# Patient Record
Sex: Female | Born: 1935 | Race: White | Hispanic: No | Smoking: Never smoker
Health system: Southern US, Community
[De-identification: ages and names within clinical notes are randomized; demographics above are authoritative.]

## PROBLEM LIST (undated history)

## (undated) DIAGNOSIS — D649 Anemia, unspecified: Secondary | ICD-10-CM

## (undated) DIAGNOSIS — K5730 Diverticulosis of large intestine without perforation or abscess without bleeding: Secondary | ICD-10-CM

## (undated) DIAGNOSIS — H509 Unspecified strabismus: Secondary | ICD-10-CM

## (undated) DIAGNOSIS — G43909 Migraine, unspecified, not intractable, without status migrainosus: Secondary | ICD-10-CM

## (undated) DIAGNOSIS — M48062 Spinal stenosis, lumbar region with neurogenic claudication: Secondary | ICD-10-CM

## (undated) DIAGNOSIS — H409 Unspecified glaucoma: Secondary | ICD-10-CM

## (undated) DIAGNOSIS — C50919 Malignant neoplasm of unspecified site of unspecified female breast: Secondary | ICD-10-CM

## (undated) DIAGNOSIS — Z7901 Long term (current) use of anticoagulants: Secondary | ICD-10-CM

## (undated) DIAGNOSIS — G4730 Sleep apnea, unspecified: Secondary | ICD-10-CM

## (undated) DIAGNOSIS — K208 Other esophagitis: Secondary | ICD-10-CM

## (undated) DIAGNOSIS — H5210 Myopia, unspecified eye: Secondary | ICD-10-CM

## (undated) DIAGNOSIS — M1710 Unilateral primary osteoarthritis, unspecified knee: Secondary | ICD-10-CM

## (undated) DIAGNOSIS — C73 Malignant neoplasm of thyroid gland: Secondary | ICD-10-CM

## (undated) DIAGNOSIS — Z9289 Personal history of other medical treatment: Secondary | ICD-10-CM

## (undated) DIAGNOSIS — H47019 Ischemic optic neuropathy, unspecified eye: Secondary | ICD-10-CM

## (undated) DIAGNOSIS — K589 Irritable bowel syndrome without diarrhea: Secondary | ICD-10-CM

## (undated) DIAGNOSIS — R011 Cardiac murmur, unspecified: Secondary | ICD-10-CM

## (undated) DIAGNOSIS — K602 Anal fissure, unspecified: Secondary | ICD-10-CM

## (undated) DIAGNOSIS — E7800 Pure hypercholesterolemia, unspecified: Secondary | ICD-10-CM

## (undated) DIAGNOSIS — H4450 Unspecified degenerated conditions of globe: Secondary | ICD-10-CM

## (undated) DIAGNOSIS — I4891 Unspecified atrial fibrillation: Principal | ICD-10-CM

## (undated) DIAGNOSIS — F411 Generalized anxiety disorder: Secondary | ICD-10-CM

## (undated) DIAGNOSIS — I10 Essential (primary) hypertension: Secondary | ICD-10-CM

## (undated) DIAGNOSIS — E890 Postprocedural hypothyroidism: Secondary | ICD-10-CM

## (undated) DIAGNOSIS — K449 Diaphragmatic hernia without obstruction or gangrene: Secondary | ICD-10-CM

## (undated) DIAGNOSIS — D509 Iron deficiency anemia, unspecified: Secondary | ICD-10-CM

## (undated) DIAGNOSIS — T7840XA Allergy, unspecified, initial encounter: Secondary | ICD-10-CM

## (undated) DIAGNOSIS — M1990 Unspecified osteoarthritis, unspecified site: Secondary | ICD-10-CM

## (undated) DIAGNOSIS — Z9989 Dependence on other enabling machines and devices: Secondary | ICD-10-CM

## (undated) DIAGNOSIS — H5030 Unspecified intermittent heterotropia: Secondary | ICD-10-CM

## (undated) DIAGNOSIS — R05 Cough: Secondary | ICD-10-CM

## (undated) DIAGNOSIS — Z9889 Other specified postprocedural states: Secondary | ICD-10-CM

## (undated) DIAGNOSIS — K219 Gastro-esophageal reflux disease without esophagitis: Secondary | ICD-10-CM

## (undated) DIAGNOSIS — H35379 Puckering of macula, unspecified eye: Secondary | ICD-10-CM

## (undated) DIAGNOSIS — Z86010 Personal history of colonic polyps: Secondary | ICD-10-CM

## (undated) DIAGNOSIS — K2270 Barrett's esophagus without dysplasia: Secondary | ICD-10-CM

## (undated) DIAGNOSIS — I499 Cardiac arrhythmia, unspecified: Secondary | ICD-10-CM

## (undated) DIAGNOSIS — E119 Type 2 diabetes mellitus without complications: Secondary | ICD-10-CM

## (undated) DIAGNOSIS — M179 Osteoarthritis of knee, unspecified: Secondary | ICD-10-CM

## (undated) DIAGNOSIS — Z8719 Personal history of other diseases of the digestive system: Secondary | ICD-10-CM

## (undated) DIAGNOSIS — C539 Malignant neoplasm of cervix uteri, unspecified: Secondary | ICD-10-CM

## (undated) DIAGNOSIS — E876 Hypokalemia: Secondary | ICD-10-CM

## (undated) DIAGNOSIS — I420 Dilated cardiomyopathy: Secondary | ICD-10-CM

## (undated) DIAGNOSIS — G4733 Obstructive sleep apnea (adult) (pediatric): Secondary | ICD-10-CM

## (undated) DIAGNOSIS — J209 Acute bronchitis, unspecified: Secondary | ICD-10-CM

## (undated) HISTORY — DX: Diverticulosis of large intestine without perforation or abscess without bleeding: K57.30

## (undated) HISTORY — DX: Migraine, unspecified, not intractable, without status migrainosus: G43.909

## (undated) HISTORY — DX: Unspecified strabismus: H50.9

## (undated) HISTORY — DX: Personal history of other medical treatment: Z92.89

## (undated) HISTORY — DX: Essential (primary) hypertension: I10

## (undated) HISTORY — PX: EXCISIONAL HEMORRHOIDECTOMY: SHX1541

## (undated) HISTORY — DX: Postprocedural hypothyroidism: E89.0

## (undated) HISTORY — DX: Generalized anxiety disorder: F41.1

## (undated) HISTORY — DX: Acute bronchitis, unspecified: J20.9

## (undated) HISTORY — DX: Anemia, unspecified: D64.9

## (undated) HISTORY — DX: Other specified postprocedural states: Z98.890

## (undated) HISTORY — DX: Malignant neoplasm of thyroid gland: C73

## (undated) HISTORY — DX: Hypokalemia: E87.6

## (undated) HISTORY — DX: Spinal stenosis, lumbar region with neurogenic claudication: M48.062

## (undated) HISTORY — DX: Unspecified osteoarthritis, unspecified site: M19.90

## (undated) HISTORY — PX: CATARACT EXTRACTION W/ INTRAOCULAR LENS  IMPLANT, BILATERAL: SHX1307

## (undated) HISTORY — DX: Pure hypercholesterolemia, unspecified: E78.00

## (undated) HISTORY — DX: Allergy, unspecified, initial encounter: T78.40XA

## (undated) HISTORY — DX: Cardiac murmur, unspecified: R01.1

## (undated) HISTORY — DX: Unspecified degenerated conditions of globe: H44.50

## (undated) HISTORY — DX: Long term (current) use of anticoagulants: Z79.01

## (undated) HISTORY — DX: Ischemic optic neuropathy, unspecified eye: H47.019

## (undated) HISTORY — DX: Myopia, unspecified eye: H52.10

## (undated) HISTORY — DX: Iron deficiency anemia, unspecified: D50.9

## (undated) HISTORY — DX: Sleep apnea, unspecified: G47.30

## (undated) HISTORY — DX: Unspecified intermittent heterotropia: H50.30

## (undated) HISTORY — DX: Cough: R05

## (undated) HISTORY — DX: Personal history of colonic polyps: Z86.010

## (undated) HISTORY — DX: Irritable bowel syndrome without diarrhea: K58.9

## (undated) HISTORY — DX: Dilated cardiomyopathy: I42.0

## (undated) HISTORY — PX: CARPAL TUNNEL RELEASE: SHX101

## (undated) HISTORY — PX: THYROIDECTOMY: SHX17

## (undated) HISTORY — PX: TONSILLECTOMY: SUR1361

## (undated) HISTORY — DX: Other esophagitis: K20.8

## (undated) HISTORY — PX: COLONOSCOPY: SHX174

## (undated) HISTORY — DX: Barrett's esophagus without dysplasia: K22.70

## (undated) HISTORY — DX: Malignant neoplasm of unspecified site of unspecified female breast: C50.919

## (undated) HISTORY — PX: ABDOMINAL HYSTERECTOMY: SHX81

## (undated) HISTORY — PX: BREAST BIOPSY: SHX20

## (undated) HISTORY — DX: Unspecified atrial fibrillation: I48.91

## (undated) HISTORY — DX: Gastro-esophageal reflux disease without esophagitis: K21.9

## (undated) HISTORY — DX: Puckering of macula, unspecified eye: H35.379

## (undated) HISTORY — PX: BREAST LUMPECTOMY: SHX2

## (undated) HISTORY — DX: Diaphragmatic hernia without obstruction or gangrene: K44.9

---

## 1998-06-08 ENCOUNTER — Ambulatory Visit (HOSPITAL_COMMUNITY): Admission: RE | Admit: 1998-06-08 | Discharge: 1998-06-08 | Payer: Self-pay

## 1999-05-23 ENCOUNTER — Ambulatory Visit (HOSPITAL_COMMUNITY): Admission: RE | Admit: 1999-05-23 | Discharge: 1999-05-23 | Payer: Self-pay | Admitting: Endocrinology

## 1999-05-27 ENCOUNTER — Encounter: Payer: Self-pay | Admitting: Endocrinology

## 1999-08-04 ENCOUNTER — Ambulatory Visit (HOSPITAL_COMMUNITY): Admission: RE | Admit: 1999-08-04 | Discharge: 1999-08-04 | Payer: Self-pay | Admitting: Internal Medicine

## 1999-08-04 ENCOUNTER — Encounter: Payer: Self-pay | Admitting: Internal Medicine

## 2000-10-30 ENCOUNTER — Ambulatory Visit (HOSPITAL_COMMUNITY): Admission: RE | Admit: 2000-10-30 | Discharge: 2000-10-30 | Payer: Self-pay | Admitting: *Deleted

## 2000-10-31 ENCOUNTER — Ambulatory Visit (HOSPITAL_COMMUNITY): Admission: RE | Admit: 2000-10-31 | Discharge: 2000-10-31 | Payer: Self-pay | Admitting: *Deleted

## 2000-12-26 ENCOUNTER — Encounter (INDEPENDENT_AMBULATORY_CARE_PROVIDER_SITE_OTHER): Payer: Self-pay | Admitting: Specialist

## 2000-12-26 ENCOUNTER — Other Ambulatory Visit: Admission: RE | Admit: 2000-12-26 | Discharge: 2000-12-26 | Payer: Self-pay | Admitting: Gastroenterology

## 2001-09-12 ENCOUNTER — Encounter: Admission: RE | Admit: 2001-09-12 | Discharge: 2001-09-18 | Payer: Self-pay | Admitting: Specialist

## 2002-04-02 ENCOUNTER — Ambulatory Visit (HOSPITAL_COMMUNITY): Admission: RE | Admit: 2002-04-02 | Discharge: 2002-04-02 | Payer: Self-pay | Admitting: Cardiology

## 2002-04-02 ENCOUNTER — Encounter: Payer: Self-pay | Admitting: Cardiology

## 2002-07-31 ENCOUNTER — Ambulatory Visit (HOSPITAL_COMMUNITY): Admission: RE | Admit: 2002-07-31 | Discharge: 2002-07-31 | Payer: Self-pay | Admitting: Neurology

## 2002-07-31 ENCOUNTER — Encounter: Payer: Self-pay | Admitting: Neurology

## 2003-05-11 ENCOUNTER — Encounter: Payer: Self-pay | Admitting: Endocrinology

## 2003-05-11 ENCOUNTER — Ambulatory Visit (HOSPITAL_COMMUNITY): Admission: RE | Admit: 2003-05-11 | Discharge: 2003-05-11 | Payer: Self-pay | Admitting: Endocrinology

## 2003-12-05 ENCOUNTER — Emergency Department (HOSPITAL_COMMUNITY): Admission: EM | Admit: 2003-12-05 | Discharge: 2003-12-05 | Payer: Self-pay | Admitting: Emergency Medicine

## 2004-03-14 ENCOUNTER — Encounter: Admission: RE | Admit: 2004-03-14 | Discharge: 2004-06-12 | Payer: Self-pay | Admitting: Endocrinology

## 2004-07-07 ENCOUNTER — Encounter: Admission: RE | Admit: 2004-07-07 | Discharge: 2004-07-07 | Payer: Self-pay | Admitting: Endocrinology

## 2004-08-02 ENCOUNTER — Ambulatory Visit (HOSPITAL_COMMUNITY): Admission: RE | Admit: 2004-08-02 | Discharge: 2004-08-02 | Payer: Self-pay | Admitting: Endocrinology

## 2004-08-05 ENCOUNTER — Encounter: Admission: RE | Admit: 2004-08-05 | Discharge: 2004-08-05 | Payer: Self-pay | Admitting: Endocrinology

## 2004-08-08 ENCOUNTER — Ambulatory Visit (HOSPITAL_COMMUNITY): Admission: RE | Admit: 2004-08-08 | Discharge: 2004-08-08 | Payer: Self-pay | Admitting: Cardiology

## 2005-01-06 ENCOUNTER — Ambulatory Visit: Payer: Self-pay | Admitting: Gastroenterology

## 2005-01-18 ENCOUNTER — Ambulatory Visit: Payer: Self-pay | Admitting: Gastroenterology

## 2005-09-12 ENCOUNTER — Ambulatory Visit: Payer: Self-pay | Admitting: Gastroenterology

## 2005-09-18 ENCOUNTER — Ambulatory Visit: Payer: Self-pay | Admitting: Gastroenterology

## 2005-12-01 ENCOUNTER — Encounter: Admission: RE | Admit: 2005-12-01 | Discharge: 2005-12-01 | Payer: Self-pay | Admitting: Orthopedic Surgery

## 2006-02-28 ENCOUNTER — Ambulatory Visit (HOSPITAL_COMMUNITY): Admission: RE | Admit: 2006-02-28 | Discharge: 2006-02-28 | Payer: Self-pay | Admitting: Endocrinology

## 2006-08-18 ENCOUNTER — Emergency Department (HOSPITAL_COMMUNITY): Admission: EM | Admit: 2006-08-18 | Discharge: 2006-08-18 | Payer: Self-pay | Admitting: Orthopedic Surgery

## 2006-08-21 ENCOUNTER — Emergency Department (HOSPITAL_COMMUNITY): Admission: EM | Admit: 2006-08-21 | Discharge: 2006-08-21 | Payer: Self-pay | Admitting: Emergency Medicine

## 2006-08-24 ENCOUNTER — Encounter (HOSPITAL_COMMUNITY): Admission: RE | Admit: 2006-08-24 | Discharge: 2006-11-22 | Payer: Self-pay | Admitting: Emergency Medicine

## 2007-03-22 ENCOUNTER — Ambulatory Visit (HOSPITAL_COMMUNITY): Admission: RE | Admit: 2007-03-22 | Discharge: 2007-03-22 | Payer: Self-pay | Admitting: Endocrinology

## 2007-11-18 ENCOUNTER — Encounter: Admission: RE | Admit: 2007-11-18 | Discharge: 2007-11-18 | Payer: Self-pay | Admitting: Endocrinology

## 2007-11-22 ENCOUNTER — Encounter: Admission: RE | Admit: 2007-11-22 | Discharge: 2007-11-22 | Payer: Self-pay | Admitting: Cardiology

## 2007-11-22 ENCOUNTER — Ambulatory Visit: Payer: Self-pay | Admitting: Oncology

## 2007-11-28 LAB — COMPREHENSIVE METABOLIC PANEL
AST: 18 U/L (ref 0–37)
Albumin: 4.1 g/dL (ref 3.5–5.2)
BUN: 27 mg/dL — ABNORMAL HIGH (ref 6–23)
Calcium: 9.1 mg/dL (ref 8.4–10.5)
Chloride: 104 mEq/L (ref 96–112)
Glucose, Bld: 84 mg/dL (ref 70–99)
Potassium: 3.7 mEq/L (ref 3.5–5.3)
Sodium: 141 mEq/L (ref 135–145)
Total Protein: 6.7 g/dL (ref 6.0–8.3)

## 2007-11-28 LAB — CBC WITH DIFFERENTIAL/PLATELET
Basophils Absolute: 0 10*3/uL (ref 0.0–0.1)
Eosinophils Absolute: 0.3 10*3/uL (ref 0.0–0.5)
HGB: 11.9 g/dL (ref 11.6–15.9)
NEUT#: 3.1 10*3/uL (ref 1.5–6.5)
RBC: 4.21 10*6/uL (ref 3.70–5.32)
RDW: 14.5 % (ref 11.3–14.5)
WBC: 5.3 10*3/uL (ref 3.9–10.0)
lymph#: 1.4 10*3/uL (ref 0.9–3.3)

## 2007-11-28 LAB — CEA: CEA: 0.5 ng/mL (ref 0.0–5.0)

## 2007-11-28 LAB — CANCER ANTIGEN 27.29: CA 27.29: 20 U/mL (ref 0–39)

## 2007-12-03 LAB — IMMUNOFIXATION ELECTROPHORESIS
IgA: 226 mg/dL (ref 68–378)
Total Protein, Serum Electrophoresis: 7 g/dL (ref 6.0–8.3)

## 2007-12-20 ENCOUNTER — Ambulatory Visit (HOSPITAL_COMMUNITY): Admission: RE | Admit: 2007-12-20 | Discharge: 2007-12-20 | Payer: Self-pay | Admitting: Oncology

## 2008-01-03 LAB — CBC WITH DIFFERENTIAL/PLATELET
Basophils Absolute: 0 10*3/uL (ref 0.0–0.1)
Eosinophils Absolute: 0.2 10*3/uL (ref 0.0–0.5)
HCT: 36.8 % (ref 34.8–46.6)
HGB: 12.5 g/dL (ref 11.6–15.9)
LYMPH%: 19.6 % (ref 14.0–48.0)
MCV: 82.5 fL (ref 81.0–101.0)
MONO%: 8.8 % (ref 0.0–13.0)
NEUT#: 3.4 10*3/uL (ref 1.5–6.5)
NEUT%: 67 % (ref 39.6–76.8)
Platelets: 181 10*3/uL (ref 145–400)
RBC: 4.46 10*6/uL (ref 3.70–5.32)

## 2008-01-05 LAB — TRANSFERRIN RECEPTOR, SOLUABLE: Transferrin Receptor, Soluble: 34.6 nmol/L

## 2008-01-05 LAB — FERRITIN: Ferritin: 13 ng/mL (ref 10–291)

## 2008-01-06 ENCOUNTER — Ambulatory Visit (HOSPITAL_COMMUNITY): Admission: RE | Admit: 2008-01-06 | Discharge: 2008-01-06 | Payer: Self-pay | Admitting: Oncology

## 2008-01-29 ENCOUNTER — Ambulatory Visit: Payer: Self-pay | Admitting: Oncology

## 2008-02-04 ENCOUNTER — Ambulatory Visit: Payer: Self-pay | Admitting: Gastroenterology

## 2008-03-04 ENCOUNTER — Ambulatory Visit: Payer: Self-pay | Admitting: Gastroenterology

## 2008-03-04 ENCOUNTER — Encounter: Payer: Self-pay | Admitting: Gastroenterology

## 2008-03-04 LAB — CONVERTED CEMR LAB
Basophils Absolute: 0 10*3/uL (ref 0.0–0.1)
Basophils Relative: 0 % (ref 0.0–1.0)
Eosinophils Relative: 4.5 % (ref 0.0–5.0)
MCHC: 32.4 g/dL (ref 30.0–36.0)
MCV: 83.7 fL (ref 78.0–100.0)
Monocytes Absolute: 0.3 10*3/uL (ref 0.1–1.0)
Monocytes Relative: 7.9 % (ref 3.0–12.0)
Neutro Abs: 2.6 10*3/uL (ref 1.4–7.7)
Platelets: 158 10*3/uL (ref 150–400)
Transferrin: 311.1 mg/dL (ref >=212.0)

## 2008-03-12 ENCOUNTER — Telehealth: Payer: Self-pay | Admitting: Gastroenterology

## 2008-03-23 ENCOUNTER — Ambulatory Visit (HOSPITAL_COMMUNITY): Admission: RE | Admit: 2008-03-23 | Discharge: 2008-03-23 | Payer: Self-pay | Admitting: Oncology

## 2008-05-04 ENCOUNTER — Ambulatory Visit (HOSPITAL_COMMUNITY): Admission: RE | Admit: 2008-05-04 | Discharge: 2008-05-04 | Payer: Self-pay | Admitting: Oncology

## 2008-05-06 ENCOUNTER — Ambulatory Visit: Payer: Self-pay | Admitting: Oncology

## 2008-05-08 ENCOUNTER — Encounter: Payer: Self-pay | Admitting: Gastroenterology

## 2008-05-08 LAB — CBC WITH DIFFERENTIAL/PLATELET
Basophils Absolute: 0 10*3/uL (ref 0.0–0.1)
Eosinophils Absolute: 0.3 10*3/uL (ref 0.0–0.5)
HCT: 35.7 % (ref 34.8–46.6)
HGB: 12.1 g/dL (ref 11.6–15.9)
LYMPH%: 19.3 % (ref 14.0–48.0)
MCV: 83.5 fL (ref 81.0–101.0)
MONO#: 0.5 10*3/uL (ref 0.1–0.9)
MONO%: 10.1 % (ref 0.0–13.0)
NEUT#: 3 10*3/uL (ref 1.5–6.5)
Platelets: 177 10*3/uL (ref 145–400)
WBC: 4.8 10*3/uL (ref 3.9–10.0)

## 2008-05-11 LAB — COMPREHENSIVE METABOLIC PANEL
AST: 17 U/L (ref 0–37)
Alkaline Phosphatase: 69 U/L (ref 39–117)
BUN: 31 mg/dL — ABNORMAL HIGH (ref 6–23)
Calcium: 9 mg/dL (ref 8.4–10.5)
Creatinine, Ser: 0.79 mg/dL (ref 0.40–1.20)
Total Bilirubin: 0.6 mg/dL (ref 0.3–1.2)

## 2008-05-11 LAB — FERRITIN: Ferritin: 9 ng/mL — ABNORMAL LOW (ref 10–291)

## 2008-05-11 LAB — IRON AND TIBC
%SAT: 23 % (ref 20–55)
TIBC: 383 ug/dL (ref 250–470)
UIBC: 296 ug/dL

## 2008-05-11 LAB — TRANSFERRIN RECEPTOR, SOLUABLE: Transferrin Receptor, Soluble: 30.6 nmol/L

## 2008-07-08 ENCOUNTER — Ambulatory Visit: Payer: Self-pay | Admitting: Oncology

## 2008-07-10 LAB — CBC WITH DIFFERENTIAL/PLATELET
BASO%: 0.7 % (ref 0.0–2.0)
Eosinophils Absolute: 0.2 10*3/uL (ref 0.0–0.5)
MONO#: 0.4 10*3/uL (ref 0.1–0.9)
NEUT#: 2.9 10*3/uL (ref 1.5–6.5)
RBC: 4.36 10*6/uL (ref 3.70–5.32)
RDW: 15.3 % — ABNORMAL HIGH (ref 11.3–14.5)
WBC: 4.3 10*3/uL (ref 3.9–10.0)
lymph#: 0.8 10*3/uL — ABNORMAL LOW (ref 0.9–3.3)

## 2008-07-13 LAB — COMPREHENSIVE METABOLIC PANEL
ALT: 19 U/L (ref 0–35)
Albumin: 4.3 g/dL (ref 3.5–5.2)
CO2: 24 mEq/L (ref 19–32)
Calcium: 9.2 mg/dL (ref 8.4–10.5)
Chloride: 107 mEq/L (ref 96–112)
Glucose, Bld: 111 mg/dL — ABNORMAL HIGH (ref 70–99)
Sodium: 143 mEq/L (ref 135–145)
Total Bilirubin: 0.7 mg/dL (ref 0.3–1.2)
Total Protein: 6.9 g/dL (ref 6.0–8.3)

## 2008-07-13 LAB — IRON AND TIBC
%SAT: 32 % (ref 20–55)
Iron: 125 ug/dL (ref 42–145)

## 2008-07-13 LAB — LACTATE DEHYDROGENASE: LDH: 179 U/L (ref 94–250)

## 2008-07-24 ENCOUNTER — Encounter: Payer: Self-pay | Admitting: Gastroenterology

## 2008-10-30 ENCOUNTER — Ambulatory Visit: Payer: Self-pay | Admitting: Oncology

## 2008-11-02 ENCOUNTER — Ambulatory Visit (HOSPITAL_COMMUNITY): Admission: RE | Admit: 2008-11-02 | Discharge: 2008-11-02 | Payer: Self-pay | Admitting: Oncology

## 2008-11-16 ENCOUNTER — Encounter: Payer: Self-pay | Admitting: Gastroenterology

## 2008-11-16 LAB — CBC WITH DIFFERENTIAL/PLATELET
BASO%: 0.3 % (ref 0.0–2.0)
EOS%: 4.6 % (ref 0.0–7.0)
Eosinophils Absolute: 0.3 10*3/uL (ref 0.0–0.5)
MCHC: 34.2 g/dL (ref 32.0–36.0)
MCV: 91.4 fL (ref 81.0–101.0)
MONO%: 7.6 % (ref 0.0–13.0)
NEUT#: 4.2 10*3/uL (ref 1.5–6.5)
RBC: 4.27 10*6/uL (ref 3.70–5.32)
RDW: 13.8 % (ref 11.3–14.5)

## 2008-11-19 LAB — COMPREHENSIVE METABOLIC PANEL
ALT: 27 U/L (ref 0–35)
CO2: 27 mEq/L (ref 19–32)
Calcium: 9.4 mg/dL (ref 8.4–10.5)
Chloride: 105 mEq/L (ref 96–112)
Creatinine, Ser: 0.88 mg/dL (ref 0.40–1.20)
Glucose, Bld: 131 mg/dL — ABNORMAL HIGH (ref 70–99)
Total Bilirubin: 0.6 mg/dL (ref 0.3–1.2)

## 2008-11-19 LAB — FERRITIN: Ferritin: 19 ng/mL (ref 10–291)

## 2008-11-19 LAB — LACTATE DEHYDROGENASE: LDH: 200 U/L (ref 94–250)

## 2008-11-19 LAB — IRON AND TIBC
%SAT: 24 % (ref 20–55)
Iron: 86 ug/dL (ref 42–145)

## 2008-12-08 ENCOUNTER — Encounter: Payer: Self-pay | Admitting: Gastroenterology

## 2009-04-09 ENCOUNTER — Ambulatory Visit: Payer: Self-pay | Admitting: Oncology

## 2009-04-14 ENCOUNTER — Ambulatory Visit (HOSPITAL_COMMUNITY): Admission: RE | Admit: 2009-04-14 | Discharge: 2009-04-14 | Payer: Self-pay | Admitting: Oncology

## 2009-04-14 LAB — CBC WITH DIFFERENTIAL/PLATELET
BASO%: 0.7 % (ref 0.0–2.0)
Basophils Absolute: 0 10*3/uL (ref 0.0–0.1)
EOS%: 5.1 % (ref 0.0–7.0)
Eosinophils Absolute: 0.2 10*3/uL (ref 0.0–0.5)
HCT: 36.9 % (ref 34.8–46.6)
HGB: 12.6 g/dL (ref 11.6–15.9)
MCH: 30.7 pg (ref 25.1–34.0)
MCV: 89.5 fL (ref 79.5–101.0)
RBC: 4.12 10*6/uL (ref 3.70–5.45)
RDW: 13.4 % (ref 11.2–14.5)

## 2009-04-14 LAB — IRON AND TIBC
Iron: 120 ug/dL (ref 42–145)
UIBC: 229 ug/dL

## 2009-04-14 LAB — LACTATE DEHYDROGENASE: LDH: 153 U/L (ref 94–250)

## 2009-04-14 LAB — COMPREHENSIVE METABOLIC PANEL
Albumin: 3.6 g/dL (ref 3.5–5.2)
Alkaline Phosphatase: 78 U/L (ref 39–117)
BUN: 26 mg/dL — ABNORMAL HIGH (ref 6–23)
CO2: 27 mEq/L (ref 19–32)
Calcium: 8.9 mg/dL (ref 8.4–10.5)
Chloride: 106 mEq/L (ref 96–112)
Glucose, Bld: 102 mg/dL — ABNORMAL HIGH (ref 70–99)
Potassium: 3.5 mEq/L (ref 3.5–5.3)
Sodium: 140 mEq/L (ref 135–145)
Total Protein: 6.4 g/dL (ref 6.0–8.3)

## 2009-04-14 LAB — FERRITIN: Ferritin: 23 ng/mL (ref 10–291)

## 2009-04-16 ENCOUNTER — Encounter: Payer: Self-pay | Admitting: Gastroenterology

## 2009-10-19 ENCOUNTER — Ambulatory Visit (HOSPITAL_COMMUNITY): Admission: RE | Admit: 2009-10-19 | Discharge: 2009-10-19 | Payer: Self-pay | Admitting: Endocrinology

## 2010-10-13 DIAGNOSIS — C50919 Malignant neoplasm of unspecified site of unspecified female breast: Secondary | ICD-10-CM | POA: Insufficient documentation

## 2010-10-13 DIAGNOSIS — E7800 Pure hypercholesterolemia, unspecified: Secondary | ICD-10-CM | POA: Insufficient documentation

## 2010-10-13 DIAGNOSIS — E119 Type 2 diabetes mellitus without complications: Secondary | ICD-10-CM | POA: Insufficient documentation

## 2010-10-13 DIAGNOSIS — E1169 Type 2 diabetes mellitus with other specified complication: Secondary | ICD-10-CM | POA: Insufficient documentation

## 2010-10-13 DIAGNOSIS — K208 Other esophagitis without bleeding: Secondary | ICD-10-CM

## 2010-10-13 DIAGNOSIS — K5730 Diverticulosis of large intestine without perforation or abscess without bleeding: Secondary | ICD-10-CM | POA: Insufficient documentation

## 2010-10-13 DIAGNOSIS — K602 Anal fissure, unspecified: Secondary | ICD-10-CM

## 2010-10-13 DIAGNOSIS — Z860100 Personal history of colon polyps, unspecified: Secondary | ICD-10-CM

## 2010-10-13 DIAGNOSIS — K589 Irritable bowel syndrome without diarrhea: Secondary | ICD-10-CM

## 2010-10-13 DIAGNOSIS — M1990 Unspecified osteoarthritis, unspecified site: Secondary | ICD-10-CM | POA: Insufficient documentation

## 2010-10-13 DIAGNOSIS — G43909 Migraine, unspecified, not intractable, without status migrainosus: Secondary | ICD-10-CM | POA: Insufficient documentation

## 2010-10-13 DIAGNOSIS — K2270 Barrett's esophagus without dysplasia: Secondary | ICD-10-CM | POA: Insufficient documentation

## 2010-10-13 DIAGNOSIS — D649 Anemia, unspecified: Secondary | ICD-10-CM | POA: Insufficient documentation

## 2010-10-13 DIAGNOSIS — I10 Essential (primary) hypertension: Secondary | ICD-10-CM

## 2010-10-13 DIAGNOSIS — F411 Generalized anxiety disorder: Secondary | ICD-10-CM

## 2010-10-13 DIAGNOSIS — C73 Malignant neoplasm of thyroid gland: Secondary | ICD-10-CM | POA: Insufficient documentation

## 2010-10-13 DIAGNOSIS — K449 Diaphragmatic hernia without obstruction or gangrene: Secondary | ICD-10-CM | POA: Insufficient documentation

## 2010-10-13 DIAGNOSIS — R011 Cardiac murmur, unspecified: Secondary | ICD-10-CM | POA: Insufficient documentation

## 2010-10-13 HISTORY — DX: Irritable bowel syndrome, unspecified: K58.9

## 2010-10-13 HISTORY — DX: Personal history of colon polyps, unspecified: Z86.0100

## 2010-10-13 HISTORY — DX: Diaphragmatic hernia without obstruction or gangrene: K44.9

## 2010-10-13 HISTORY — DX: Essential (primary) hypertension: I10

## 2010-10-13 HISTORY — DX: Malignant neoplasm of unspecified site of unspecified female breast: C50.919

## 2010-10-13 HISTORY — DX: Diverticulosis of large intestine without perforation or abscess without bleeding: K57.30

## 2010-10-13 HISTORY — DX: Other esophagitis without bleeding: K20.80

## 2010-10-13 HISTORY — DX: Personal history of colonic polyps: Z86.010

## 2010-10-13 HISTORY — DX: Anal fissure, unspecified: K60.2

## 2010-10-13 HISTORY — DX: Migraine, unspecified, not intractable, without status migrainosus: G43.909

## 2010-10-13 HISTORY — DX: Generalized anxiety disorder: F41.1

## 2010-10-13 HISTORY — DX: Pure hypercholesterolemia, unspecified: E78.00

## 2010-10-13 HISTORY — DX: Unspecified osteoarthritis, unspecified site: M19.90

## 2010-10-13 HISTORY — DX: Malignant neoplasm of thyroid gland: C73

## 2010-10-13 HISTORY — DX: Cardiac murmur, unspecified: R01.1

## 2010-10-14 ENCOUNTER — Ambulatory Visit: Payer: Self-pay | Admitting: Gastroenterology

## 2010-10-14 ENCOUNTER — Encounter (INDEPENDENT_AMBULATORY_CARE_PROVIDER_SITE_OTHER): Payer: Self-pay | Admitting: *Deleted

## 2010-10-14 DIAGNOSIS — K219 Gastro-esophageal reflux disease without esophagitis: Secondary | ICD-10-CM | POA: Insufficient documentation

## 2010-10-14 DIAGNOSIS — D509 Iron deficiency anemia, unspecified: Secondary | ICD-10-CM | POA: Insufficient documentation

## 2010-10-14 HISTORY — DX: Iron deficiency anemia, unspecified: D50.9

## 2010-10-14 HISTORY — DX: Gastro-esophageal reflux disease without esophagitis: K21.9

## 2010-10-14 LAB — CONVERTED CEMR LAB
Basophils Absolute: 0.1 K/uL
Basophils Relative: 0.9 %
Eosinophils Absolute: 0.4 K/uL
Eosinophils Relative: 6.5 % — ABNORMAL HIGH
Ferritin: 17.5 ng/mL
Folate: >20 ng/mL
HCT: 39 %
Hemoglobin: 13.3 g/dL
Iron: 90 ug/dL
Lymphocytes Relative: 20.3 %
Lymphs Abs: 1.2 K/uL
MCHC: 34.2 g/dL
MCV: 91.9 fL
Monocytes Absolute: 0.6 K/uL
Monocytes Relative: 9.5 %
Neutro Abs: 3.6 K/uL
Neutrophils Relative %: 62.8 %
Platelets: 149 K/uL — ABNORMAL LOW
RBC: 4.24 M/uL
RDW: 13.2 %
Saturation Ratios: 20.9 %
Transferrin: 307.2 mg/dL
Vitamin B-12: 543 pg/mL
WBC: 5.8 10*3/microliter

## 2010-10-18 ENCOUNTER — Ambulatory Visit: Payer: Self-pay | Admitting: Gastroenterology

## 2010-11-18 ENCOUNTER — Ambulatory Visit (HOSPITAL_COMMUNITY)
Admission: RE | Admit: 2010-11-18 | Discharge: 2010-11-18 | Payer: Self-pay | Source: Home / Self Care | Attending: Internal Medicine | Admitting: Internal Medicine

## 2010-12-03 ENCOUNTER — Encounter: Payer: Self-pay | Admitting: Endocrinology

## 2010-12-04 ENCOUNTER — Encounter: Payer: Self-pay | Admitting: Internal Medicine

## 2010-12-04 ENCOUNTER — Encounter (HOSPITAL_COMMUNITY): Payer: Self-pay | Admitting: Oncology

## 2010-12-05 ENCOUNTER — Telehealth: Payer: Self-pay | Admitting: Gastroenterology

## 2010-12-07 ENCOUNTER — Ambulatory Visit
Admission: RE | Admit: 2010-12-07 | Discharge: 2010-12-07 | Payer: Self-pay | Source: Home / Self Care | Attending: Gastroenterology | Admitting: Gastroenterology

## 2010-12-07 ENCOUNTER — Other Ambulatory Visit: Payer: Self-pay | Admitting: Gastroenterology

## 2010-12-07 DIAGNOSIS — K2270 Barrett's esophagus without dysplasia: Secondary | ICD-10-CM

## 2010-12-07 LAB — GLUCOSE, CAPILLARY: Glucose-Capillary: 125 mg/dL — ABNORMAL HIGH (ref 70–99)

## 2010-12-08 LAB — HELICOBACTER PYLORI SCREEN-BIOPSY: UREASE: NEGATIVE

## 2010-12-09 ENCOUNTER — Telehealth: Payer: Self-pay | Admitting: Gastroenterology

## 2010-12-09 ENCOUNTER — Encounter: Payer: Self-pay | Admitting: Gastroenterology

## 2010-12-13 NOTE — Assessment & Plan Note (Signed)
Summary: Natasha barretts esophagus--ch.    History of Present Illness Visit Type: Follow-up Visit Primary GI Natasha: Sheryn Bison Natasha FACP FAGA Primary Britt Petroni: Kirby [name], [name], Natasha is not having any problems at this time.  Natasha does state if she talks on the phone for an extended period of time she will get hiccups History of Present Illness:   Very Pleasant 77 year old [name] female that I follow for several years for acid reflux. She has biopsied proven [name] mucosa with last biopsies 2 years ago. She is on daily over-the-counter Prevacid 15 mg and continues with hiccups and some acid reflux symptoms but no dysphagia. Previous colonoscopy was unremarkable, and she denies lower GI or hepatobiliary complaints. She does have adult onset diabetes, also is on chronic iron replacement for iron deficiency of unexplained etiology. Previous small bowel biopsy showed no evidence of celiac disease.   GI Review of Systems      Denies abdominal pain, acid reflux, belching, bloating, chest pain, dysphagia with liquids, dysphagia with solids, heartburn, loss of appetite, nausea, vomiting, vomiting blood, weight loss, and  weight gain.        Denies anal fissure, black tarry stools, change in bowel habit, constipation, diarrhea, diverticulosis, fecal incontinence, heme positive stool, hemorrhoids, irritable bowel syndrome, jaundice, light color stool, liver problems, rectal bleeding, and  rectal pain. Preventive Screening-Counseling & Management  Alcohol-Tobacco     Smoking Status: never      Drug Use:  no.      Current Medications (verified): 1)  Tandem Plus 162-115.2-1 Mg  Caps (Fefum-Fepo-Fa-B Cmp-C-Zn-Mn-Cu) .... Take 1 Tablet By Mouth Once A Day 2)  Prevacid 24hr 15 Mg Cpdr (Lansoprazole) .... Take 1 Tablet By Mouth Once A Day in The Morning 3)  Celebrex 200 Mg Caps (Celecoxib) .... Take 1 Tablet By Mouth Once A Day 4)  Synthroid 137 Mcg Tabs  (Levothyroxine Sodium) .... Take 1 Tablet By Mouth Once A Day 5)  Losartan Potassium-Hctz 100-12.5 Mg Tabs (Losartan Potassium-Hctz) .... Take 1 Tablet By Mouth Once A Day 6)  Sertraline Hcl 50 Mg Tabs (Sertraline Hcl) .... Take 1 Tablet By Mouth Once A Day 7)  Januvia 25 Mg Tabs (Sitagliptin Phosphate) .... Take 1 Tablet By Mouth Once A Day 8)  Metformin Hcl 500 Mg Xr24h-Tab (Metformin Hcl) .... Take 2 Tablets By Mouth Two Times A Day  Allergies (verified): 1)  ! Penicillin  Past History:  Past medical, surgical, family and social histories (including risk factors) reviewed for relevance to current acute and chronic problems.  Past Medical History: Reviewed history from 10/13/2010 and no changes required. Current Problems:  IRRITABLE BOWEL SYNDROME (ICD-564.1) RECTAL FISSURE (ICD-565.0) DM (ICD-250.00) HYPERTENSION (ICD-401.9) HYPERCHOLESTEROLEMIA (ICD-272.0) CARDIAC MURMUR (ICD-785.2) ANXIETY (ICD-300.00) EROSIVE ESOPHAGITIS (ICD-530.19) OSTEOARTHRITIS (ICD-715.90) THYROID CANCER (ICD-193) MIGRAINE HEADACHE (ICD-346.90) ADENOCARCINOMA, BREAST (ICD-174.9) COLONIC POLYPS, ADENOMATOUS, HX OF (ICD-V12.72) DIVERTICULOSIS, COLON (ICD-562.10) ANEMIA, UNSPECIFIED (ICD-285.9) HIATAL HERNIA WITH REFLUX (ICD-553.3) BARRETTS ESOPHAGUS (ICD-530.85)  Past Surgical History: Appendectomy Hysterectomy T & A Thyroidectomy Right lumpectomy Knee Arthroscopy  Family History: Reviewed history and no changes required. Family History of Breast Cancer: Sister Family History of Colon Cancer: Sister Family History of Liver Cancer: Brother Family History of Pancreatic Cancer: Sister-mets to liver Family History of Prostate Cancer: Prostate Family History of Diabetes: Son, sister x 3, brother x 3 Family History of Heart Disease: Brother deceased MI, Sister-CHF, Sister-AA  Social History: Reviewed history and no changes required. Divorced, 1 boy, 1 girl--twins Retired Patient has  never  smoked.  Alcohol Use - no Daily Caffeine Use--1.5 cup/day Illicit Drug Use - no Smoking Status:  never Drug Use:  no  Review of Systems  The patient denies allergy/sinus, anemia, anxiety-new, arthritis/joint pain, back pain, blood in urine, breast changes/lumps, confusion, cough, coughing up blood, depression-new, fainting, fatigue, fever, headaches-new, hearing problems, heart murmur, heart rhythm changes, itching, menstrual pain, muscle pains/cramps, night sweats, nosebleeds, pregnancy symptoms, shortness of breath, skin rash, sleeping problems, sore throat, swelling of feet/legs, swollen lymph glands, thirst - excessive, urination - excessive, urination changes/pain, urine leakage, vision changes, and voice change.   GI:  Complains of indigestion/heartburn; denies difficulty swallowing, pain on swallowing, nausea, vomiting, vomiting blood, abdominal pain, jaundice, gas/bloating, diarrhea, constipation, change in bowel habits, bloody BM's, black BMs, and fecal incontinence.  Vital Signs:  Patient profile:   77 year old female Height:      66.5 inches Weight:      205 pounds BMI:     32.71 Pulse rate:   68 / minute Pulse rhythm:   regular BP sitting:   120 / 74  (left arm) Cuff size:   regular  Vitals Entered By: Francee Piccolo CMA Duncan Dull) (October 14, 2010 10:40 AM)  Physical Exam  General:  Well developed, well nourished, no acute distress.healthy appearing.   Head:  Normocephalic and atraumatic. Eyes:  PERRLA, no icterus.exam deferred to patient's ophthalmologist.   Lungs:  Clear throughout to auscultation. Heart:  Regular rate and rhythm; no murmurs, rubs,  or bruits. Abdomen:  Soft, nontender and nondistended. No masses, hepatosplenomegaly or hernias noted. Normal bowel sounds. Extremities:  No clubbing, cyanosis, edema or deformities noted. Neurologic:  Alert and  oriented x4;  grossly normal neurologically. Psych:  Alert and cooperative. Normal mood and  affect.   Impression & Recommendations:  Problem # 1:  GERD (ICD-530.81) Assessment Unchanged Switch to Dexilant 60 mg a day, reflux regime reviewed, and schedule followup endoscopy. Orders: TLB-B12, Serum-Total ONLY (78295-A21) TLB-Ferritin (82728-FER) TLB-Folic Acid (Folate) (82746-FOL) TLB-IBC Pnl (Iron/FE;Transferrin) (83550-IBC) TLB-CBC Platelet - w/Differential (85025-CBCD) EGD (EGD)  Problem # 2:  ANEMIA, IRON DEFICIENCY (ICD-280.9) Assessment: Unchanged etiology unclear. I have repeated her CBC and anemia profile and will check stool exam for human hemoglobin. She does have a sister that had colon carcinoma at a very elderly age. Orders: TLB-B12, Serum-Total ONLY (30865-H84) TLB-Ferritin (82728-FER) TLB-Folic Acid (Folate) (82746-FOL) TLB-IBC Pnl (Iron/FE;Transferrin) (83550-IBC) TLB-CBC Platelet - w/Differential (85025-CBCD) EGD (EGD)  Problem # 3:  DM (ICD-250.00) Assessment: Improved continue medications per primary care.  Patient Instructions: 1)  Copy sent to : Kirby Funk, Natasha 2)  Your procedure has been scheduled for 12/07/2010, please follow the seperate instructions.  3)  Dexilant samples given please call if the samples work well. 4)  [facility] Patient Information Guide given to patient.  5)  Upper Endoscopy brochure given.  6)  Please go to the basement today for your labs.  7)  The medication list was reviewed and reconciled.  All changed / newly prescribed medications were explained.  A complete medication list was provided to the patient / caregiver. 8)  [name] Esophagus brochure given.

## 2010-12-13 NOTE — Letter (Signed)
Summary: EGD Instructions  [facility]  [address] [place], [place] [address]   Phone: [phone]  Fax: [phone]       [name]    12/25/1930    MRN: [id number]       Procedure Day /Date: 12/07/2010 Wednesday     Arrival Time:  8:30am     Procedure Time: 9:30am     Location of Procedure:                    [facility] (4th Floor)    PREPARATION FOR ENDOSCOPY   On 12/07/2010 THE DAY OF THE PROCEDURE:  1.   No solid foods, milk or milk products are allowed after midnight the night before your procedure.  2.   Do not drink anything colored red or purple.  Avoid juices with pulp.  No orange juice.  3.  You may drink clear liquids until 7:30am, which is 2 hours before your procedure.                                                                                                CLEAR LIQUIDS INCLUDE: Water Jello Ice Popsicles Tea (sugar ok, no milk/cream) Powdered fruit flavored drinks Coffee (sugar ok, no milk/cream) Gatorade Juice: apple, white grape, white cranberry  Lemonade Clear bullion, consomm, broth Carbonated beverages (any kind) Strained chicken noodle soup Hard Candy   MEDICATION INSTRUCTIONS  Unless otherwise instructed, you should take regular prescription medications with a small sip of water as early as possible the morning of your procedure.  Diabetic patients - see separate instructions.               OTHER INSTRUCTIONS  You will need a responsible adult at least 75 years of age to accompany you and drive you home.   This person must remain in the waiting room during your procedure.  Wear loose fitting clothing that is easily removed.  Leave jewelry and other valuables at home.  However, you may wish to bring a book to read or an iPod/MP3 player to listen to music as you wait for your procedure to start.  Remove all body piercing jewelry and leave at home.  Total time from sign-in until  discharge is approximately 2-3 hours.  You should go home directly after your procedure and rest.  You can resume normal activities the day after your procedure.  The day of your procedure you should not:   Drive   Make legal decisions   Operate machinery   Drink alcohol   Return to work  You will receive specific instructions about eating, activities and medications before you leave.    The above instructions have been reviewed and explained to me by   _______________________    I fully understand and can verbalize these instructions _____________________________ Date _________

## 2010-12-13 NOTE — Procedures (Signed)
Summary: Endoscopy   EGD  Procedure date:  03/04/2008  Findings:      Location: [facility]   Patient Name: [name], [name] MRN:  Procedure Procedures: Panendoscopy (EGD) CPT: 43235.    with biopsy(s)/brushing(s). CPT: D1846139.  Personnel: Endoscopist: Vania Rea. Jarold Motto, MD.  Exam Location: Exam performed in Outpatient Clinic. Outpatient  Patient Consent: Procedure, Alternatives, Risks and Benefits discussed, consent obtained, from patient. Consent was obtained by the RN.  Indications  Evaluation of: Anemia,   Surveillance of: Barrett's Esophagus.  Comments: She takes bid Prevacid... History  Current Medications: Patient is not currently taking Coumadin.  Medical/Surgical History: Breast Cancer, Surgery-chemo. Reflux Disease, Thyroid cancer, Surgery. Osteoarthritis, Hyperlipidemia, Hypertension, Headaches,  Pre-Exam Physical: Performed Mar 04, 2008  Cardio-pulmonary exam, Abdominal exam, Extremity exam, Mental status exam WNL.  Comments: Pt. history reviewed/updated, physical exam performed prior to initiation of sedation? yes Exam Exam Info: Maximum depth of insertion Duodenum, intended Duodenum. Patient position: on left side. Duration of exam: 10 minutes. Vocal cords visualized. Gastric retroflexion performed. Images taken. ASA Classification: II. Tolerance: excellent.  Sedation Meds: Patient assessed and found to be appropriate for moderate (conscious) sedation. Sedation was managed by the Endoscopist. Cetacaine Spray 2 sprays given aerosolized. Versed 2 mg. given IV.  Monitoring: BP and pulse monitoring done. Oximetry used. Supplemental O2 given at 2 Liters.  Instrument(s): GIF 160. Serial [id number].   Findings - Normal: Proximal Esophagus to Mid Esophagus.  - HIATAL HERNIA: Prolapsing, 10 cms. in length. ICD9: Hernia, Hiatal: 553.3.Comments: Extremely LARGE hiatial hernia present..  - Normal: Fundus to Duodenal 2nd Portion. Not  Seen: Ulcer. Mucosal abnormality. Foreign body. Varices. Biopsy/Normal taken. Comments: SI Bx. done.Marland KitchenMarland KitchenR/O celiac disease...  - BARRETT'S ESOPHAGUS: Length of Barrett's 3 cm. No inflammation present. Biopsy/Barrett's taken. ICD9: Barrett's: 530.2.   Assessment  Diagnoses: 553.3: Hernia, Hiatal. Chronic GERD.  530.2: Barrett's.   Comments: NO HEME IN UGI AREA.... Events  Unplanned Intervention: No unplanned interventions were required.  Plans Medication(s): Continue current medications.  Patient Education: Patient given standard instructions for: Barrett's. Reflux.  Disposition: After procedure patient sent to recovery. After recovery patient sent home.  Scheduling: Await pathology to schedule patient. Follow-up prn.    CC: Charolette Child, MD     Kimberlee Nearing, MD  This report was created from the original endoscopy report, which was reviewed and signed by the above listed endoscopist.

## 2010-12-13 NOTE — Procedures (Signed)
Summary: Colonoscopy   Colonoscopy  Procedure date:  03/04/2008  Findings:      Location:  [facility].   Patient Name: [name], [name] MRN:  Procedure Procedures: Colonoscopy CPT: [phone].  Personnel: Endoscopist: Vania Rea. Jarold Motto, MD.  Exam Location: Exam performed in Outpatient Clinic. Outpatient  Patient Consent: Procedure, Alternatives, Risks and Benefits discussed, consent obtained, from patient. Consent was obtained by the RN.  Indications  Evaluation of: Anemia  Surveillance of: Adenomatous Polyp(s).  History  Current Medications: Patient is not currently taking Coumadin.  Medical/ Surgical History: Breast Cancer, Surgery-chemo. Reflux Disease, Thyroid cancer, Surgery. Osteoarthritis, Hyperlipidemia, Hypertension, Headaches,  Pre-Exam Physical: Performed Mar 04, 2008. Cardio-pulmonary exam, Rectal exam, Abdominal exam, Extremity exam, Mental status exam WNL.  Comments: Pt. history reviewed/updated, physical exam performed prior to initiation of sedation? yes Exam Exam: Extent of exam reached: Cecum, extent intended: Cecum.  The cecum was identified by appendiceal orifice and IC valve. Patient position: on left side. Time to Cecum: 00:07:20. Time for Withdrawl: 00:05:44. Colon retroflexion performed. Images taken. ASA Classification: II. Tolerance: excellent.  Monitoring: Pulse and BP monitoring, Oximetry used. Supplemental O2 given. at 2 Liters.  Colon Prep Used Golytely for colon prep. Prep results: fair, adequate exam.  Sedation Meds: Patient assessed and found to be appropriate for moderate (conscious) sedation. Sedation was managed by the Endoscopist. Fentanyl 75 mcg. given IV. Versed 7 mg. given IV.  Findings - NORMAL EXAM: Cecum to Descending Colon. Not Seen: Polyps. AVM's. Colitis. Tumors. Crohn's.  - DIVERTICULOSIS: Descending Colon to Sigmoid Colon. Not bleeding. ICD9: Diverticulosis, Colon: 562.10. Comments: SEVERE tics  with thickened red haustral folds..  - NORMAL EXAM: Sigmoid Colon to Rectum. Comments: Prominent perianal papillae....   Assessment  Diagnoses: 562.10: Diverticulosis, Colon.   Comments: NO POLYPS NOTED... Events  Unplanned Interventions: No intervention was required.  Plans Medication Plan: Referring provider to order medications. Fiber supplements: Methylcellulose 1 Tbsp QAM, starting Mar 04, 2008 for indefinitely.   Patient Education: Patient given standard instructions for: Diverticulosis. high fiber diet .  Disposition: After procedure patient sent to recovery. After recovery patient sent home.  Scheduling/Referral: Follow-Up prn. EGD, to [facility]. Jarold Motto, MD, on Mar 04, 2008.    cc: Charolette Child, MD     Kimberlee Nearing, MD   This report was created from the original endoscopy report, which was reviewed and signed by the above listed endoscopist.

## 2010-12-13 NOTE — Letter (Signed)
Summary: [facility]: Immunoassay Fecal Occult Blood (iFOB) Order [facility]  [address] [place], [place] [address]   Phone: [phone]  Fax: [phone]      [facility]: Immunoassay Fecal Occult Blood (iFOB) Order Form   October 14, 2010 MRN: [id number]   DELMA DRONE [birth year]   Physicican Name: Sheryn Bison, MD  Diagnosis Code: 280.9     Harlow Mares CMA (AAMA)

## 2010-12-13 NOTE — Letter (Signed)
Summary: Diabetic Instructions  [facility]  [address] [place], [place] [address]   Phone: [phone]  Fax: [phone]    TIJA BISS [birth year] MRN: [id number]   X   ORAL DIABETIC MEDICATION INSTRUCTIONS  The day before your procedure:   Take your diabetic pill as you do normally  The day of your procedure:   Do not take your diabetic pill    We will check your blood sugar levels during the admission process and again in Recovery before discharging you home

## 2010-12-15 NOTE — Progress Notes (Signed)
Summary: Prep ?s   Phone Note Call from Patient Call back at Home Phone [phone]   Caller: Patient Call For: Dr.Patterson Reason for Call: Talk to Nurse Summary of Call: Pt has forgotten all her prep instructions, she is diabetic and having EGD on 1/25 Initial call taken by: [place] Johnson,  December 05, 2010 8:40 AM  Follow-up for Phone Call        Discussed instructions for EGD with patient and instructed her to hold her diabetic meds on am of procedure, but she may take her regular meds; clear liquids until 7:30am. Patient stated understanding. Follow-up by: Graciella Freer RN,  December 05, 2010 9:48 AM

## 2010-12-15 NOTE — Progress Notes (Signed)
Summary: ?s   Phone Note Call from Patient Call back at Home Phone [phone]   Caller: Patient Call For: Dr. Jarold Motto Reason for Call: Talk to Nurse Summary of Call: Pt wants to know how long she has to stay on her soft diet Initial call taken by: [place] Johnson,  December 09, 2010 8:45 AM  Follow-up for Phone Call        advised to have a regular diet now.  Follow-up by: Harlow Mares CMA (AAMA),  December 09, 2010 9:18 AM

## 2010-12-15 NOTE — Procedures (Addendum)
Summary: Upper Endoscopy  Patient: Ebelin Dillehay Note: All result statuses are Final unless otherwise noted.  Tests: (1) Upper Endoscopy (EGD)   EGD Upper Endoscopy       DONE     [facility]     [address]     [place], [place]  [address]           ENDOSCOPY PROCEDURE REPORT           PATIENT:  Suetta, Hoffmeister  MR#:  [id number]     BIRTHDATE:  1930-12-27, 79 yrs. old  GENDER:  female           ENDOSCOPIST:  Vania Rea. Jarold Motto, MD, [facility]     Referred by:           PROCEDURE DATE:  12/07/2010     PROCEDURE:  EGD with biopsy for H. pylori 19147     ASA CLASS:  Class II     INDICATIONS:  h/o Barrett's Esophagus, GERD           MEDICATIONS:   Fentanyl 50 mcg IV, Versed 5 mg IV     TOPICAL ANESTHETIC:  Exactacain Spray           DESCRIPTION OF PROCEDURE:   After the risks benefits and     alternatives of the procedure were thoroughly explained, informed     consent was obtained.  The LB GIF-H180 [id number] endoscope was     introduced through the mouth and advanced to the second portion of     the duodenum, without limitations.  The instrument was slowly     withdrawn as the mucosa was fully examined.     <<PROCEDUREIMAGES>>           A hiatal hernia was found. 5-6 CM PROLAPSING HH NOTED.  Barrett's     esophagus was found at the gastroesophageal junction. SHORT     BARRETT'S SEGMENT BIOPSIED.SEE PICTURES,,,  Normal duodenal folds     were noted.  The stomach was entered and closely examined. The     antrum, angularis, and lesser curvature were well visualized,     including a retroflexed view of the cardia and fundus. The stomach     wall was normally distensable. The scope passed easily through the     pylorus into the duodenum. CLO BX. DONE.    Retroflexed views     revealed a hiatal hernia.    The scope was then withdrawn from the     patient and the procedure completed.           COMPLICATIONS:  None           ENDOSCOPIC IMPRESSION:     1) Hiatal hernia  2) Barrett's esophagus at the gastroesophageal junction     3) A hiatal hernia     R/O DYSPLASIA.     RECOMMENDATIONS:     1) Await biopsy results     2) continue PPI           REPEAT EXAM:  No           ______________________________     Vania Rea. Jarold Motto, MD, [facility]           CC:  Kirby Funk, MD           n.     Rosalie Doctor:   Vania Rea. Patterson at 12/07/2010 09:42 AM           Emmaline Life, [id number]  Note: An  exclamation mark (!) indicates a result that was not dispersed into the flowsheet. Document Creation Date: 12/07/2010 9:43 AM _______________________________________________________________________  (1) Order result status: Final Collection or observation date-time: 12/07/2010 09:34 Requested date-time:  Receipt date-time:  Reported date-time:  Referring Physician:   Ordering Physician: Sheryn Bison [phone]) Specimen Source:  Source: Launa Grill Order Number: [id number] Lab site:   Appended Document: Upper Endoscopy DILATED,ATONIC ESOPHAGUS NOTED.SHE IS BACK ON PEVACID DAILY..."WORKS BETTER"...

## 2010-12-15 NOTE — Miscellaneous (Signed)
Summary: Orders Update  Clinical Lists Changes  Orders: Added new Test order of TLB-H Pylori Screen Gastric Biopsy ([id number]-CLOTEST) - Signed 

## 2010-12-21 NOTE — Letter (Signed)
Summary: Patient Notice-Endo Biopsy Results  [facility]  [address] [place], [place] [address]   Phone: [phone]  Fax: [phone]        December 09, 2010 MRN: [id number]    [name] [address] [place], [place]  [address]    Dear Ms. CRESSEY,  I am pleased to inform you that the biopsies taken during your recent endoscopic examination did not show any evidence of cancer upon pathologic examination.  Additional information/recommendations:  __No further action is needed at this time.  Please follow-up with      your primary care physician for your other healthcare needs.  __ Please call [phone] to schedule a return visit to review      your condition.  _xx_ Continue with the treatment plan as outlined on the day of your      exam.Gastric biopsies were negative for H. pylori infection.  __ You should have a repeat endoscopic examination for this problem              in _ months/years.   Please call us if you are having persistent problems or have questions about your condition that have not been fully answered at this time.  Sincerely,  Mardella Layman MD [facility]  This letter has been electronically signed by your physician.  Appended Document: Patient Notice-Endo Biopsy Results LETTER MAILED

## 2011-03-28 NOTE — Assessment & Plan Note (Signed)
[facility]                         GASTROENTEROLOGY OFFICE NOTE   NAME:[place], KIA VARNADORE                     MRN:          [id number]  DATE:02/04/2008                            DOB:          1931-02-11    [name] is a 77 year old white female who has had recent diagnosis  of breast carcinoma with surgery and chemotherapy by Dr. Arline Asp.  She  apparently has had some pain in her sternal area and has a hot spot  which is apparently being evaluated with serial CT scans.  As part of  her recent workup she was found to have hemoglobin of 11.9 with a normal  MCV.  I really do not have any other blood work at this time for review.   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  Her last endoscopic exam and biopsies were three years ago  and there was no evidence of dysplasia.  She also has a long history of  recurrent colon polyps and colonoscopy removal of a cecal adenomatous  polyp in March of 2006.  She does have known diverticulosis coli.  She  currently is moving her bowels well and denies bowel irregularity,  abdominal pain, melena or hematochezia.  Actually further review of her  chart does show a hemoglobin recently of 11.1 with an MCV of 79 and a  12% iron saturation.  This is all changed from her previous levels I had  in January.   She follows a regular diet and denies any specific food intolerances.  In addition to Prevacid 30 mg twice a day, she takes Synthroid 0.15 mg a  day, Metformin 1000 mg twice a day for diabetes, Januvia 100 mg a day,  Sertraline 50 mg a day, Celebrex 100 mg twice a day, Hyzaar 50/12.5 mg a  day, Crestor 10 mg daily and lorazepam 1 mg daily.  She does have a  history of Penicillin allergy.   PAST MEDICAL HISTORY:  The patient has had partial thyroidectomy for  thyroid cancer, mastectomies, and has a  history of chronic anxiety and  depression, osteoarthritis, and hyperlipidemia along with hypertension.  She has had total abdominal hysterectomy and an appendectomy.  Previous  surgery was by Dr. Kendrick Ranch and she is followed cardiac wise by Dr.  Aleen Campi.  Primary care physician is Dr. Adela Lank.   She is a healthy appearing white female in no acute distress, appearing  her stated age.  She weighs 209 pounds, which is her normal weight.  Blood pressure 110/58.  Pulse was 88, regular.  Chest was clear and she  appeared to be in a regular rhythm without murmurs, gallops or rubs.  I  could not appreciate hepatosplenomegaly, abdominal masses or tenderness  at this time.  Bowel sounds were normal.  Rectal exam was deferred.   ASSESSMENT:  1. New onset iron deficiency anemia of questionable source - rule out      occult GI bleeding.  2. History of recurrent colon polyps  with colonoscopy due at this      time.  3. History of chronic gastroesophageal reflux disease with Barrett's      mucosa with endoscopic evaluation and dysplasia screening due at      this time.  4. Status post right mastectomy for breast cancer.  5. History of partial thyroidectomy for thyroid cancer.  6. Sternal lesion either from trauma or metastatic disease.  7. Status post hysterectomy.  8. History of recurrent rectal fissures.  9. History of well controlled essential hypertension.  10.History of irritable bowel syndrome and diverticulosis coli.  11.History of chronic migraine headaches.   RECOMMENDATIONS:  1. Outpatient endoscopy and colonoscopy at her convenience.  2. I placed her on Tandem daily as iron replacement.  3. Continue other medications as listed above.     Vania Rea. Jarold Motto, MD, Caleen Essex, FAGA  Electronically Signed    DRP/MedQ  DD: 02/04/2008  DT: 02/04/2008  Job #: [id number]   cc:   Brooke Bonito, M.D.  Antionette Char, MD  Sheppard Plumber. Earlene Plater, M.D.  Samul Dada, M.D.

## 2011-12-25 ENCOUNTER — Other Ambulatory Visit (HOSPITAL_COMMUNITY): Payer: Self-pay | Admitting: Internal Medicine

## 2011-12-25 DIAGNOSIS — Z1231 Encounter for screening mammogram for malignant neoplasm of breast: Secondary | ICD-10-CM

## 2012-01-18 ENCOUNTER — Ambulatory Visit (HOSPITAL_COMMUNITY): Payer: Medicare Other

## 2012-02-20 ENCOUNTER — Ambulatory Visit (HOSPITAL_COMMUNITY)
Admission: RE | Admit: 2012-02-20 | Discharge: 2012-02-20 | Disposition: A | Discharge status: Home / Self Care | Payer: Medicare Other | Source: Ambulatory Visit | Attending: Internal Medicine | Admitting: Internal Medicine

## 2012-02-20 DIAGNOSIS — Z1231 Encounter for screening mammogram for malignant neoplasm of breast: Secondary | ICD-10-CM | POA: Insufficient documentation

## 2012-07-09 DIAGNOSIS — H5210 Myopia, unspecified eye: Secondary | ICD-10-CM

## 2012-07-09 DIAGNOSIS — H5030 Unspecified intermittent heterotropia: Secondary | ICD-10-CM | POA: Insufficient documentation

## 2012-07-09 DIAGNOSIS — H35379 Puckering of macula, unspecified eye: Secondary | ICD-10-CM | POA: Insufficient documentation

## 2012-07-09 HISTORY — DX: Myopia, unspecified eye: H52.10

## 2012-07-09 HISTORY — DX: Puckering of macula, unspecified eye: H35.379

## 2012-07-09 HISTORY — DX: Unspecified intermittent heterotropia: H50.30

## 2012-07-25 DIAGNOSIS — H4450 Unspecified degenerated conditions of globe: Secondary | ICD-10-CM | POA: Insufficient documentation

## 2012-07-25 DIAGNOSIS — Z9889 Other specified postprocedural states: Secondary | ICD-10-CM

## 2012-07-25 HISTORY — DX: Other specified postprocedural states: Z98.890

## 2012-07-25 HISTORY — DX: Unspecified degenerated conditions of globe: H44.50

## 2013-06-23 ENCOUNTER — Telehealth: Payer: Self-pay | Admitting: Gastroenterology

## 2013-06-24 NOTE — Telephone Encounter (Signed)
Patient states that she feels better and doesn't need the appt at this time.  She will call back if she changes her mind and wants to come in and be seen

## 2013-09-25 ENCOUNTER — Other Ambulatory Visit: Payer: Self-pay | Admitting: Internal Medicine

## 2013-09-25 ENCOUNTER — Ambulatory Visit
Admission: RE | Admit: 2013-09-25 | Discharge: 2013-09-25 | Disposition: A | Discharge status: Home / Self Care | Payer: Medicare Other | Source: Ambulatory Visit | Attending: Internal Medicine | Admitting: Internal Medicine

## 2013-09-25 DIAGNOSIS — J069 Acute upper respiratory infection, unspecified: Secondary | ICD-10-CM

## 2013-10-06 ENCOUNTER — Other Ambulatory Visit (HOSPITAL_COMMUNITY): Payer: Self-pay | Admitting: Internal Medicine

## 2013-10-06 DIAGNOSIS — Z1231 Encounter for screening mammogram for malignant neoplasm of breast: Secondary | ICD-10-CM

## 2013-10-10 ENCOUNTER — Ambulatory Visit (HOSPITAL_COMMUNITY)
Admission: RE | Admit: 2013-10-10 | Discharge: 2013-10-10 | Disposition: A | Discharge status: Home / Self Care | Payer: Medicare Other | Source: Ambulatory Visit | Attending: Internal Medicine | Admitting: Internal Medicine

## 2013-10-10 DIAGNOSIS — Z1231 Encounter for screening mammogram for malignant neoplasm of breast: Secondary | ICD-10-CM

## 2013-10-21 ENCOUNTER — Other Ambulatory Visit: Payer: Self-pay | Admitting: Internal Medicine

## 2013-10-21 DIAGNOSIS — N6459 Other signs and symptoms in breast: Secondary | ICD-10-CM

## 2013-10-30 ENCOUNTER — Ambulatory Visit
Admission: RE | Admit: 2013-10-30 | Discharge: 2013-10-30 | Disposition: A | Discharge status: Home / Self Care | Payer: Medicare Other | Source: Ambulatory Visit | Attending: Internal Medicine | Admitting: Internal Medicine

## 2013-10-30 DIAGNOSIS — N6459 Other signs and symptoms in breast: Secondary | ICD-10-CM

## 2013-11-13 DIAGNOSIS — I4891 Unspecified atrial fibrillation: Secondary | ICD-10-CM

## 2013-11-13 HISTORY — DX: Unspecified atrial fibrillation: I48.91

## 2014-03-28 ENCOUNTER — Encounter (HOSPITAL_COMMUNITY): Payer: Self-pay | Admitting: Emergency Medicine

## 2014-03-28 ENCOUNTER — Emergency Department (HOSPITAL_COMMUNITY): Payer: Medicare Other

## 2014-03-28 ENCOUNTER — Inpatient Hospital Stay (HOSPITAL_COMMUNITY)
Admission: EM | Admit: 2014-03-28 | Discharge: 2014-03-31 | DRG: 310 | Disposition: A | Discharge status: Home / Self Care | Payer: Medicare Other | Attending: Cardiovascular Disease | Admitting: Cardiovascular Disease

## 2014-03-28 DIAGNOSIS — H409 Unspecified glaucoma: Secondary | ICD-10-CM | POA: Insufficient documentation

## 2014-03-28 DIAGNOSIS — Z833 Family history of diabetes mellitus: Secondary | ICD-10-CM

## 2014-03-28 DIAGNOSIS — E119 Type 2 diabetes mellitus without complications: Secondary | ICD-10-CM | POA: Diagnosis present

## 2014-03-28 DIAGNOSIS — Z8541 Personal history of malignant neoplasm of cervix uteri: Secondary | ICD-10-CM

## 2014-03-28 DIAGNOSIS — Z853 Personal history of malignant neoplasm of breast: Secondary | ICD-10-CM

## 2014-03-28 DIAGNOSIS — Z85850 Personal history of malignant neoplasm of thyroid: Secondary | ICD-10-CM

## 2014-03-28 DIAGNOSIS — Z800 Family history of malignant neoplasm of digestive organs: Secondary | ICD-10-CM

## 2014-03-28 DIAGNOSIS — C73 Malignant neoplasm of thyroid gland: Secondary | ICD-10-CM | POA: Insufficient documentation

## 2014-03-28 DIAGNOSIS — E890 Postprocedural hypothyroidism: Secondary | ICD-10-CM | POA: Diagnosis present

## 2014-03-28 DIAGNOSIS — M1710 Unilateral primary osteoarthritis, unspecified knee: Secondary | ICD-10-CM | POA: Diagnosis present

## 2014-03-28 DIAGNOSIS — Z7901 Long term (current) use of anticoagulants: Secondary | ICD-10-CM

## 2014-03-28 DIAGNOSIS — Z8249 Family history of ischemic heart disease and other diseases of the circulatory system: Secondary | ICD-10-CM

## 2014-03-28 DIAGNOSIS — I480 Paroxysmal atrial fibrillation: Secondary | ICD-10-CM | POA: Diagnosis present

## 2014-03-28 DIAGNOSIS — M179 Osteoarthritis of knee, unspecified: Secondary | ICD-10-CM | POA: Insufficient documentation

## 2014-03-28 DIAGNOSIS — G4733 Obstructive sleep apnea (adult) (pediatric): Secondary | ICD-10-CM | POA: Diagnosis present

## 2014-03-28 DIAGNOSIS — I10 Essential (primary) hypertension: Secondary | ICD-10-CM | POA: Diagnosis present

## 2014-03-28 DIAGNOSIS — I4892 Unspecified atrial flutter: Secondary | ICD-10-CM | POA: Diagnosis present

## 2014-03-28 DIAGNOSIS — I4891 Unspecified atrial fibrillation: Secondary | ICD-10-CM

## 2014-03-28 DIAGNOSIS — R011 Cardiac murmur, unspecified: Secondary | ICD-10-CM | POA: Insufficient documentation

## 2014-03-28 DIAGNOSIS — Z880 Allergy status to penicillin: Secondary | ICD-10-CM

## 2014-03-28 HISTORY — DX: Obstructive sleep apnea (adult) (pediatric): G47.33

## 2014-03-28 HISTORY — DX: Essential (primary) hypertension: I10

## 2014-03-28 HISTORY — DX: Cardiac murmur, unspecified: R01.1

## 2014-03-28 HISTORY — DX: Type 2 diabetes mellitus without complications: E11.9

## 2014-03-28 HISTORY — DX: Gastro-esophageal reflux disease without esophagitis: K21.9

## 2014-03-28 HISTORY — DX: Malignant neoplasm of unspecified site of unspecified female breast: C50.919

## 2014-03-28 HISTORY — DX: Migraine, unspecified, not intractable, without status migrainosus: G43.909

## 2014-03-28 HISTORY — DX: Malignant neoplasm of thyroid gland: C73

## 2014-03-28 HISTORY — DX: Malignant neoplasm of cervix uteri, unspecified: C53.9

## 2014-03-28 HISTORY — DX: Unilateral primary osteoarthritis, unspecified knee: M17.10

## 2014-03-28 HISTORY — DX: Unspecified atrial fibrillation: I48.91

## 2014-03-28 HISTORY — DX: Dependence on other enabling machines and devices: Z99.89

## 2014-03-28 HISTORY — DX: Postprocedural hypothyroidism: E89.0

## 2014-03-28 HISTORY — DX: Unspecified osteoarthritis, unspecified site: M19.90

## 2014-03-28 HISTORY — DX: Personal history of other diseases of the digestive system: Z87.19

## 2014-03-28 HISTORY — DX: Osteoarthritis of knee, unspecified: M17.9

## 2014-03-28 HISTORY — DX: Unspecified glaucoma: H40.9

## 2014-03-28 LAB — COMPREHENSIVE METABOLIC PANEL
ALK PHOS: 70 U/L (ref 39–117)
ALT: 15 U/L (ref 0–35)
AST: 15 U/L (ref 0–37)
Albumin: 3.3 g/dL — ABNORMAL LOW (ref 3.5–5.2)
BUN: 25 mg/dL — ABNORMAL HIGH (ref 6–23)
CO2: 24 mEq/L (ref 19–32)
Calcium: 9 mg/dL (ref 8.4–10.5)
Chloride: 104 mEq/L (ref 96–112)
Creatinine, Ser: 0.78 mg/dL (ref 0.50–1.10)
GFR calc non Af Amer: 76 mL/min — ABNORMAL LOW (ref >90)
GFR, EST AFRICAN AMERICAN: 88 mL/min — AB (ref >90)
Glucose, Bld: 127 mg/dL — ABNORMAL HIGH (ref 70–99)
POTASSIUM: 3.5 meq/L — AB (ref 3.7–5.3)
Sodium: 142 mEq/L (ref 137–147)
TOTAL PROTEIN: 6.2 g/dL (ref 6.0–8.3)
Total Bilirubin: 0.4 mg/dL (ref 0.3–1.2)

## 2014-03-28 LAB — CBC WITH DIFFERENTIAL/PLATELET
BASOS PCT: 0 % (ref 0–1)
Basophils Absolute: 0 10*3/uL (ref 0.0–0.1)
Basophils Absolute: 0 10*3/uL (ref 0.0–0.1)
Basophils Relative: 1 % (ref 0–1)
EOS ABS: 0.3 10*3/uL (ref 0.0–0.7)
EOS PCT: 7 % — AB (ref 0–5)
Eosinophils Absolute: 0.3 10*3/uL (ref 0.0–0.7)
Eosinophils Relative: 6 % — ABNORMAL HIGH (ref 0–5)
HCT: 36.6 % (ref 36.0–46.0)
HEMATOCRIT: 34.1 % — AB (ref 36.0–46.0)
HEMOGLOBIN: 12.1 g/dL (ref 12.0–15.0)
Hemoglobin: 11.4 g/dL — ABNORMAL LOW (ref 12.0–15.0)
LYMPHS ABS: 1.1 10*3/uL (ref 0.7–4.0)
LYMPHS ABS: 1.3 10*3/uL (ref 0.7–4.0)
Lymphocytes Relative: 23 % (ref 12–46)
Lymphocytes Relative: 30 % (ref 12–46)
MCH: 29.5 pg (ref 26.0–34.0)
MCH: 29.3 pg (ref 26.0–34.0)
MCHC: 33.1 g/dL (ref 30.0–36.0)
MCHC: 33.4 g/dL (ref 30.0–36.0)
MCV: 89.3 fL (ref 78.0–100.0)
MCV: 87.7 fL (ref 78.0–100.0)
Monocytes Absolute: 0.4 10*3/uL (ref 0.1–1.0)
Monocytes Absolute: 0.5 10*3/uL (ref 0.1–1.0)
Monocytes Relative: 8 % (ref 3–12)
Monocytes Relative: 11 % (ref 3–12)
NEUTROS PCT: 63 % (ref 43–77)
NEUTROS PCT: 51 % (ref 43–77)
Neutro Abs: 3.1 10*3/uL (ref 1.7–7.7)
Neutro Abs: 2.2 10*3/uL (ref 1.7–7.7)
PLATELETS: 162 10*3/uL (ref 150–400)
Platelets: 158 10*3/uL (ref 150–400)
RBC: 4.1 MIL/uL (ref 3.87–5.11)
RBC: 3.89 MIL/uL (ref 3.87–5.11)
RDW: 13.1 % (ref 11.5–15.5)
RDW: 13.1 % (ref 11.5–15.5)
WBC: 4.8 10*3/uL (ref 4.0–10.5)
WBC: 4.4 10*3/uL (ref 4.0–10.5)

## 2014-03-28 LAB — MAGNESIUM
Magnesium: 1.8 mg/dL (ref 1.5–2.5)
Magnesium: 1.8 mg/dL (ref 1.5–2.5)

## 2014-03-28 LAB — BASIC METABOLIC PANEL
BUN: 28 mg/dL — ABNORMAL HIGH (ref 6–23)
CHLORIDE: 104 meq/L (ref 96–112)
CO2: 23 mEq/L (ref 19–32)
Calcium: 9.5 mg/dL (ref 8.4–10.5)
Creatinine, Ser: 0.87 mg/dL (ref 0.50–1.10)
GFR calc Af Amer: 70 mL/min — ABNORMAL LOW (ref >90)
GFR, EST NON AFRICAN AMERICAN: 61 mL/min — AB (ref >90)
Glucose, Bld: 125 mg/dL — ABNORMAL HIGH (ref 70–99)
Potassium: 3.8 mEq/L (ref 3.7–5.3)
SODIUM: 143 meq/L (ref 137–147)

## 2014-03-28 LAB — APTT
APTT: 28 s (ref 24–37)
aPTT: 28 seconds (ref 24–37)

## 2014-03-28 LAB — GLUCOSE, CAPILLARY: Glucose-Capillary: 132 mg/dL — ABNORMAL HIGH (ref 70–99)

## 2014-03-28 LAB — PROTIME-INR
INR: 1.02 (ref 0.00–1.49)
INR: 1.09 (ref 0.00–1.49)
Prothrombin Time: 13.2 seconds (ref 11.6–15.2)
Prothrombin Time: 13.9 seconds (ref 11.6–15.2)

## 2014-03-28 LAB — TROPONIN I

## 2014-03-28 LAB — TSH: TSH: 1.13 u[IU]/mL (ref 0.350–4.500)

## 2014-03-28 MED ORDER — SODIUM CHLORIDE 0.9 % IV BOLUS (SEPSIS)
500.0000 mL | Freq: Once | INTRAVENOUS | Status: AC
  Administered 2014-03-28: 500 mL via INTRAVENOUS

## 2014-03-28 MED ORDER — METFORMIN HCL ER 500 MG PO TB24
500.0000 mg | ORAL_TABLET | Freq: Every day | ORAL | Status: DC
  Administered 2014-03-29 – 2014-03-30 (×2): 500 mg via ORAL
  Filled 2014-03-28 (×3): qty 1

## 2014-03-28 MED ORDER — ATORVASTATIN CALCIUM 40 MG PO TABS
40.0000 mg | ORAL_TABLET | Freq: Every day | ORAL | Status: DC
  Administered 2014-03-29 – 2014-03-30 (×2): 40 mg via ORAL
  Filled 2014-03-28 (×3): qty 1

## 2014-03-28 MED ORDER — LEVOTHYROXINE SODIUM 137 MCG PO TABS
137.0000 ug | ORAL_TABLET | Freq: Every day | ORAL | Status: DC
  Administered 2014-03-29 – 2014-03-31 (×3): 137 ug via ORAL
  Filled 2014-03-28 (×4): qty 1

## 2014-03-28 MED ORDER — DILTIAZEM HCL 100 MG IV SOLR: 5.0000 mg/h | INTRAVENOUS | Status: DC

## 2014-03-28 MED ORDER — MAGNESIUM OXIDE 400 MG PO TABS: 400.0000 mg | ORAL_TABLET | Freq: Two times a day (BID) | ORAL | Status: DC

## 2014-03-28 MED ORDER — METFORMIN HCL ER 750 MG PO TB24
750.0000 mg | ORAL_TABLET | Freq: Every day | ORAL | Status: DC
  Administered 2014-03-29 – 2014-03-30 (×2): 750 mg via ORAL
  Filled 2014-03-28 (×4): qty 1

## 2014-03-28 MED ORDER — LORAZEPAM 1 MG PO TABS
1.5000 mg | ORAL_TABLET | Freq: Every day | ORAL | Status: DC
  Administered 2014-03-28 – 2014-03-30 (×3): 1.5 mg via ORAL
  Filled 2014-03-28 (×6): qty 1

## 2014-03-28 MED ORDER — LATANOPROST 0.005 % OP SOLN
1.0000 [drp] | Freq: Every day | OPHTHALMIC | Status: DC
  Administered 2014-03-28 – 2014-03-30 (×3): 1 [drp] via OPHTHALMIC
  Filled 2014-03-28 (×3): qty 2.5

## 2014-03-28 MED ORDER — HYDROCHLOROTHIAZIDE 12.5 MG PO CAPS
12.5000 mg | ORAL_CAPSULE | Freq: Every day | ORAL | Status: DC
  Administered 2014-03-29 – 2014-03-30 (×2): 12.5 mg via ORAL
  Filled 2014-03-28 (×3): qty 1

## 2014-03-28 MED ORDER — DILTIAZEM HCL 100 MG IV SOLR
5.0000 mg/h | INTRAVENOUS | Status: DC
  Administered 2014-03-28: 5 mg/h via INTRAVENOUS
  Administered 2014-03-29: 10 mg/h via INTRAVENOUS
  Filled 2014-03-28 (×2): qty 100

## 2014-03-28 MED ORDER — MAGNESIUM OXIDE 400 (241.3 MG) MG PO TABS
400.0000 mg | ORAL_TABLET | Freq: Two times a day (BID) | ORAL | Status: DC
  Administered 2014-03-28 – 2014-03-31 (×6): 400 mg via ORAL
  Filled 2014-03-28 (×8): qty 1

## 2014-03-28 MED ORDER — LOSARTAN POTASSIUM 50 MG PO TABS
50.0000 mg | ORAL_TABLET | Freq: Every day | ORAL | Status: DC
  Administered 2014-03-29 – 2014-03-30 (×2): 50 mg via ORAL
  Filled 2014-03-28 (×3): qty 1

## 2014-03-28 MED ORDER — DILTIAZEM LOAD VIA INFUSION
10.0000 mg | Freq: Once | INTRAVENOUS | Status: AC
Start: 2014-03-28 — End: 2014-03-28
  Administered 2014-03-28: 10 mg via INTRAVENOUS
  Filled 2014-03-28: qty 10

## 2014-03-28 MED ORDER — SERTRALINE HCL 50 MG PO TABS
50.0000 mg | ORAL_TABLET | Freq: Every day | ORAL | Status: DC
  Administered 2014-03-29 – 2014-03-31 (×3): 50 mg via ORAL
  Filled 2014-03-28 (×3): qty 1

## 2014-03-28 MED ORDER — HEPARIN BOLUS VIA INFUSION
4000.0000 [IU] | Freq: Once | INTRAVENOUS | Status: AC
  Administered 2014-03-28: 4000 [IU] via INTRAVENOUS
  Filled 2014-03-28: qty 4000

## 2014-03-28 MED ORDER — PANTOPRAZOLE SODIUM 40 MG PO TBEC
40.0000 mg | DELAYED_RELEASE_TABLET | Freq: Every day | ORAL | Status: DC
  Administered 2014-03-29 – 2014-03-31 (×2): 40 mg via ORAL
  Filled 2014-03-28 (×2): qty 1

## 2014-03-28 MED ORDER — HEPARIN (PORCINE) IN NACL 100-0.45 UNIT/ML-% IJ SOLN
1100.0000 [IU]/h | INTRAMUSCULAR | Status: DC
  Administered 2014-03-28 (×2): 1100 [IU]/h via INTRAVENOUS
  Filled 2014-03-28 (×3): qty 250

## 2014-03-28 MED ORDER — OCUVITE-LUTEIN PO CAPS
1.0000 | ORAL_CAPSULE | Freq: Every day | ORAL | Status: DC
  Administered 2014-03-29 – 2014-03-31 (×3): 1 via ORAL
  Filled 2014-03-28 (×3): qty 1

## 2014-03-28 MED ORDER — METFORMIN HCL ER 500 MG PO TB24: 500.0000 mg | ORAL_TABLET | Freq: Two times a day (BID) | ORAL | Status: DC

## 2014-03-28 MED ORDER — LOSARTAN POTASSIUM-HCTZ 50-12.5 MG PO TABS: 1.0000 | ORAL_TABLET | Freq: Every day | ORAL | Status: DC

## 2014-03-28 NOTE — H&P (Signed)
Admit date: 03/28/2014 Referring Physician Dr. Alvino Chapel Primary Cardiologist NONE - patient requests [name] Chief complaint/reason for admission:palpitations  HPI: This is a very pleasant 80yo WF with a remote history of heart murmur followed by Dr. Glade Lloyd in the past but no other cardiac disease except HTN who presented to the ER with complaints of palpitations.  She has been under a lot of stress.  Her daughter found out she might have colon CA and her niece just had a CVA 3 days ago.  She has been worrying a lot.  She also has sleep apnea and is on CPAP and apparently felt a fluttering in her CPAP nasal pillow mask and she thought the machine was broken.  She then got concerned that it might be her heart and felt her pulse and could feel her heart racing and beating irregular. This occurred yesterday and last night and then again this am.  She went to the [facility] walkin clinic and was found to be in afib with RVR.  She has been having some dizziness and unsteadiness on her feet for 6 months.  She denies any SOB or chest pain.  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  She was sent here for further treatment from [facility] clinic.    PMH:    Past Medical History  Diagnosis Date  . Diabetes mellitus without complication   . Cervical cancer   . Breast cancer     s/p right breast lumpectomy  . Hypothyroidism, postsurgical   . Thyroid cancer     s/p thyroidectomy  . Hypertension   . Heart murmur   . DJD (degenerative joint disease) of knee   . Glaucoma     PSH:    Past Surgical History  Procedure Laterality Date  . Thyroidectomy    . Partial hysterectomy    . Breast lumpectomy      ALLERGIES:   Penicillins  Prior to Admit Meds:   (Not in a hospital admission) Family HX:    Family History  Problem Relation Age of Onset  . Prostate cancer Father   . Diabetes Sister   . Heart disease Sister   . Heart attack Brother   . Heart disease Brother   .  Diabetes Brother   . Diabetes Sister   . Diabetes Sister   . Pancreatic cancer Sister   . Diabetes Brother   . Heart disease Brother   . Diabetes Brother   . Heart disease Brother   . Diabetes Brother   . Heart disease Brother   . Diabetes Brother   . Heart disease Brother   . Diabetes Brother   . Heart disease Brother   . Diabetes Brother    Social HX:    History   Social History  . Marital Status: Divorced    Spouse Name: N/A    Number of Children: N/A  . Years of Education: N/A   Occupational History  . Not on file.   Social History Main Topics  . Smoking status: Never Smoker   . Smokeless tobacco: Not on file  . Alcohol Use: No  . Drug Use: No  . Sexual Activity: Not on file   Other Topics Concern  . Not on file   Social History Narrative  . No narrative on file     ROS:  All 11 ROS were addressed and are negative except what is stated in the HPI  PHYSICAL  EXAM Filed Vitals:   03/28/14 1727  BP: 148/91  Pulse: 150  Temp: 98.2 F (36.8 C)  Resp: 20   General: Well developed, well nourished, in no acute distress Head: Eyes PERRLA, No xanthomas.   Normal cephalic and atramatic  Lungs:   Clear bilaterally to auscultation and percussion. Heart:   Irregularly irregular S1 S2 Pulses are 2+ & equal.            No carotid bruit. No JVD.  No abdominal bruits. No femoral bruits. Abdomen: Bowel sounds are positive, abdomen soft and non-tender without masses  Extremities:   No clubbing, cyanosis or edema.  DP +1 Neuro: Alert and oriented X 3. Psych:  Good affect, responds appropriately   Labs:   Lab Results  Component Value Date   WBC 4.8 03/28/2014   HGB 12.1 03/28/2014   HCT 36.6 03/28/2014   MCV 89.3 03/28/2014   PLT 162 03/28/2014    Recent Labs Lab 03/28/14 1750  NA 143  K 3.8  CL 104  CO2 23  BUN 28*  CREATININE 0.87  CALCIUM 9.5  GLUCOSE 125*       Radiology:  No results found.  EKG:  Atrial fibrillation/flutter with RVR     ASSESSMENT:  1.  New onset atrial fibrillation/flutter with RVR of unknown duration.  The only way she could tell she was having an irregular heart beat was when she decided to feel her pulse yesterday.  She denied any palptiations.   2.  Hypothyroidism - TSH is normal 3.  HTN well controlled 4.  OSA on CPAP  PLAN:   1.  Admit to tele bed 2.  IV Heparin gtt and consider changing to NOAC in am after echo done if no valvular heart disease 3.  IV Cardizem gtt for rate control 4.  Continue home meds 5.  2D echo in am to asses LVF 6.  Cycle cardiac enzymes 7.  Continue CPAP  Sueanne Margarita, MD  03/28/2014  7:16 PM

## 2014-03-28 NOTE — ED Notes (Signed)
Pt reports feeling her own pulse and noted it was irregular, went to [facility] and sent here due to new onset atrial fib rvr rate of 120-160. Denies any cp or sob.

## 2014-03-28 NOTE — Progress Notes (Signed)
ANTICOAGULATION CONSULT NOTE - Initial Consult  Pharmacy Consult for heparin Indication: atrial fibrillation  Allergies  Allergen Reactions  . Penicillins Itching and Rash    Patient Measurements: Height: 5' 6.5" (168.9 cm) Weight: 198 lb 8 oz (90.039 kg) IBW/kg (Calculated) : 60.45 Heparin Dosing Weight: 80kg  Vital Signs: Temp: 98 F (36.7 C) (05/16 2052) Temp src: Oral (05/16 2052) BP: 124/79 mmHg (05/16 2052) Pulse Rate: 111 (05/16 2052)  Labs:  Recent Labs  03/28/14 1750  HGB 12.1  HCT 36.6  PLT 162  CREATININE 0.87    Estimated Creatinine Clearance: 57.9 ml/min (by C-G formula based on Cr of 0.87).   Medical History: Past Medical History  Diagnosis Date  . Diabetes mellitus without complication   . Cervical cancer   . Breast cancer     s/p right breast lumpectomy  . Hypothyroidism, postsurgical   . Thyroid cancer     s/p thyroidectomy  . Hypertension   . Heart murmur   . DJD (degenerative joint disease) of knee   . Glaucoma     Assessment: 80 year old female with new onset afib, not on any anticoagulants prior to admission. Orders to start IV heparin. CBC normal, will check INR with am labs.  Goal of Therapy:  Heparin level 0.3-0.7 units/ml Monitor platelets by anticoagulation protocol: Yes   Plan:  Give 4000 units bolus x 1 Start heparin infusion at 1100 units/hr Check anti-Xa level in 8 hours and daily while on heparin Continue to monitor H&H and platelets  Erin Hearing PharmD., BCPS Clinical Pharmacist Pager [phone] 03/28/2014 8:59 PM

## 2014-03-28 NOTE — ED Notes (Signed)
Pharmacy called about missing med.

## 2014-03-28 NOTE — ED Provider Notes (Signed)
CSN: [id number]     Arrival date & time 03/28/14  1719 History   First MD Initiated Contact with Patient 03/28/14 1731     Chief Complaint  Patient presents with  . Atrial Fibrillation     (Consider location/radiation/quality/duration/timing/severity/associated sxs/prior Treatment) Patient is a 80 y.o. female presenting with atrial fibrillation. The history is provided by the patient.  Atrial Fibrillation This is a new problem. Associated symptoms include shortness of breath. Pertinent negatives include no chest pain, no abdominal pain and no headaches.   patient's had shortness of breath and some fatigue over the last several days. She has a history of thyroid disease and they have been decreasing her Synthroid. She was sent in from [facility] for new-onset atrial fibrillation. No chest pain. She states she does feel her heart going a little fast. No fevers. No cough. Minimal swelling or legs. No abdominal pain. She does drink a fair amount of coffee. She denies illicit drug use. She is diabetic.  Past Medical History  Diagnosis Date  . Diabetes mellitus without complication   . Cervical cancer   . Breast cancer     s/p right breast lumpectomy  . Hypothyroidism, postsurgical   . Thyroid cancer     s/p thyroidectomy  . Hypertension   . Heart murmur   . DJD (degenerative joint disease) of knee   . Glaucoma    Past Surgical History  Procedure Laterality Date  . Thyroidectomy    . Partial hysterectomy    . Breast lumpectomy     Family History  Problem Relation Age of Onset  . Prostate cancer Father   . Diabetes Sister   . Heart disease Sister   . Heart attack Brother   . Heart disease Brother   . Diabetes Brother   . Diabetes Sister   . Diabetes Sister   . Pancreatic cancer Sister   . Diabetes Brother   . Heart disease Brother   . Diabetes Brother   . Heart disease Brother   . Diabetes Brother   . Heart disease Brother   . Diabetes Brother   . Heart disease Brother   .  Diabetes Brother   . Heart disease Brother   . Diabetes Brother    History  Substance Use Topics  . Smoking status: Never Smoker   . Smokeless tobacco: Not on file  . Alcohol Use: No   OB History   Grav Para Term Preterm Abortions TAB SAB Ect Mult Living                 Review of Systems  Constitutional: Negative for activity change and appetite change.  Eyes: Negative for pain.  Respiratory: Positive for shortness of breath. Negative for chest tightness.   Cardiovascular: Negative for chest pain and leg swelling.  Gastrointestinal: Negative for nausea, vomiting, abdominal pain and diarrhea.  Genitourinary: Negative for flank pain.  Musculoskeletal: Negative for back pain and neck stiffness.  Skin: Negative for rash.  Neurological: Negative for weakness, numbness and headaches.  Psychiatric/Behavioral: Negative for behavioral problems.      Allergies  Penicillins  Home Medications   Prior to Admission medications   Medication Sig Start Date End Date Taking? Authorizing Provider  bimatoprost (LUMIGAN) 0.03 % ophthalmic solution Place 1 drop into both eyes at bedtime.   Yes Historical Provider, MD  Calcium-Phosphorus-Vitamin D (CITRACAL +D3 PO) Take 1 tablet by mouth every evening.   Yes Historical Provider, MD  celecoxib (CELEBREX) 200 MG capsule Take 200  mg by mouth 2 (two) times daily.   Yes Historical Provider, MD  Cyanocobalamin (B-12 PO) Take 1 tablet by mouth daily.   Yes Historical Provider, MD  FeFum-FePo-FA-B Cmp-C-Zn-Mn-Cu (TANDEM PLUS) 162-115.2-1 MG CAPS Take 1 tablet by mouth daily as needed (only as needed).   Yes Historical Provider, MD  lansoprazole (PREVACID) 30 MG capsule Take 30 mg by mouth every morning.   Yes Historical Provider, MD  levothyroxine (SYNTHROID, LEVOTHROID) 137 MCG tablet Take 137 mcg by mouth daily before breakfast.   Yes Historical Provider, MD  LORazepam (ATIVAN) 1 MG tablet Take 1.5 mg by mouth at bedtime.   Yes Historical Provider, MD   losartan-hydrochlorothiazide (HYZAAR) 50-12.5 MG per tablet Take 1 tablet by mouth daily.   Yes Historical Provider, MD  magnesium oxide (MAG-OX) 400 MG tablet Take 400 mg by mouth 2 (two) times daily.   Yes Historical Provider, MD  metFORMIN (GLUMETZA) 500 MG (MOD) 24 hr tablet Take 500-750 mg by mouth 2 (two) times daily with a meal. 750mg  in am, 500mg  in pm   Yes Historical Provider, MD  multivitamin-lutein (OCUVITE-LUTEIN) CAPS capsule Take 1 capsule by mouth daily.   Yes Historical Provider, MD  OVER THE COUNTER MEDICATION Take 1 tablet by mouth daily as needed (for inflammation).   Yes Historical Provider, MD  rosuvastatin (CRESTOR) 20 MG tablet Take 10 mg by mouth at bedtime.   Yes Historical Provider, MD  sertraline (ZOLOFT) 50 MG tablet Take 50 mg by mouth daily.   Yes Historical Provider, MD   BP 124/79  Pulse 111  Temp(Src) 98 F (36.7 C) (Oral)  Resp 20  Ht 5' 6.5" (1.689 m)  Wt 198 lb 8 oz (90.039 kg)  BMI 31.56 kg/m2  SpO2 95% Physical Exam  Nursing note and vitals reviewed. Constitutional: She is oriented to person, place, and time. She appears well-developed and well-nourished.  HENT:  Head: Normocephalic and atraumatic.  Eyes: EOM are normal. Pupils are equal, round, and reactive to light.  Neck: Normal range of motion. Neck supple.  Cardiovascular:  Irregular tachycardia  Pulmonary/Chest: Effort normal and breath sounds normal. No respiratory distress. She has no wheezes. She has no rales.  Abdominal: Soft. Bowel sounds are normal. She exhibits no distension. There is no tenderness. There is no rebound and no guarding.  Musculoskeletal: Normal range of motion. She exhibits edema.  Mild bilateral lower extremity pitting edema  Neurological: She is alert and oriented to person, place, and time. No cranial nerve deficit.  Skin: Skin is warm and dry.  Psychiatric: She has a normal mood and affect. Her speech is normal.    ED Course  Procedures (including critical  care time) Labs Review Labs Reviewed  CBC WITH DIFFERENTIAL - Abnormal; Notable for the following:    Eosinophils Relative 6 (*)    All other components within normal limits  BASIC METABOLIC PANEL - Abnormal; Notable for the following:    Glucose, Bld 125 (*)    BUN 28 (*)    GFR calc non Af Amer 61 (*)    GFR calc Af Amer 70 (*)    All other components within normal limits  CBC WITH DIFFERENTIAL - Abnormal; Notable for the following:    Hemoglobin 11.4 (*)    HCT 34.1 (*)    Eosinophils Relative 7 (*)    All other components within normal limits  COMPREHENSIVE METABOLIC PANEL - Abnormal; Notable for the following:    Potassium 3.5 (*)    Glucose,  Bld 127 (*)    BUN 25 (*)    Albumin 3.3 (*)    GFR calc non Af Amer 76 (*)    GFR calc Af Amer 88 (*)    All other components within normal limits  GLUCOSE, CAPILLARY - Abnormal; Notable for the following:    Glucose-Capillary 132 (*)    All other components within normal limits  MAGNESIUM  TSH  TROPONIN I  PROTIME-INR  APTT  MAGNESIUM  TROPONIN I  TROPONIN I  OCCULT BLOOD X 1 CARD TO LAB, STOOL  BASIC METABOLIC PANEL  HEPARIN LEVEL (UNFRACTIONATED)  CBC    Imaging Review Dg Chest Port 1 View  03/28/2014   CLINICAL DATA:  Shortness of breath  EXAM: PORTABLE CHEST - 1 VIEW  COMPARISON:  September 25, 2013  FINDINGS: The mediastinal contour is normal. The heart size is enlarged. The aorta is tortuous. There is no focal infiltrate, pulmonary edema, or pleural effusion. There is hiatal hernia. Surgical clips are identified in the right axilla unchanged. No acute abnormalities identified within the visualized bones.  IMPRESSION: No active cardiopulmonary disease.   Electronically Signed   By: Abelardo Diesel M.D.   On: 03/28/2014 18:34     EKG Interpretation   Date/Time:  Saturday Mar 28 2014 17:24:14 EDT Ventricular Rate:  135 PR Interval:    QRS Duration: 94 QT Interval:  312 QTC Calculation: 468 R Axis:   77 Text  Interpretation:  Atrial flutter with variable A-V block Abnormal ECG  Confirmed by Alvino Chapel  MD, Ovid Curd [id number]) on 03/28/2014 5:33:51 PM      MDM   Final diagnoses:  Atrial fibrillation with RVR    Patient with atrial fibrillation/flutter with RVR. Required IV Cardizem for rate control. Will be admitted to cardiology.   CRITICAL CARE Performed by: Jasper Riling. Penne Rosenstock Total critical care time: 30 Critical care time was exclusive of separately billable procedures and treating other patients. Critical care was necessary to treat or prevent imminent or life-threatening deterioration. Critical care was time spent personally by me on the following activities: development of treatment plan with patient and/or surrogate as well as nursing, discussions with consultants, evaluation of patient's response to treatment, examination of patient, obtaining history from patient or surrogate, ordering and performing treatments and interventions, ordering and review of laboratory studies, ordering and review of radiographic studies, pulse oximetry and re-evaluation of patient's condition.     Jasper Riling. Alvino Chapel, MD 03/29/14 Laureen Abrahams

## 2014-03-29 DIAGNOSIS — R011 Cardiac murmur, unspecified: Secondary | ICD-10-CM

## 2014-03-29 DIAGNOSIS — I4891 Unspecified atrial fibrillation: Principal | ICD-10-CM

## 2014-03-29 LAB — BASIC METABOLIC PANEL
BUN: 19 mg/dL (ref 6–23)
CALCIUM: 8.8 mg/dL (ref 8.4–10.5)
CHLORIDE: 107 meq/L (ref 96–112)
CO2: 25 mEq/L (ref 19–32)
CREATININE: 0.8 mg/dL (ref 0.50–1.10)
GFR calc non Af Amer: 67 mL/min — ABNORMAL LOW (ref >90)
GFR, EST AFRICAN AMERICAN: 78 mL/min — AB (ref >90)
Glucose, Bld: 125 mg/dL — ABNORMAL HIGH (ref 70–99)
Potassium: 3.8 mEq/L (ref 3.7–5.3)
Sodium: 142 mEq/L (ref 137–147)

## 2014-03-29 LAB — CBC
HEMATOCRIT: 34.5 % — AB (ref 36.0–46.0)
Hemoglobin: 11.5 g/dL — ABNORMAL LOW (ref 12.0–15.0)
MCH: 29.4 pg (ref 26.0–34.0)
MCHC: 33.3 g/dL (ref 30.0–36.0)
MCV: 88.2 fL (ref 78.0–100.0)
Platelets: 158 10*3/uL (ref 150–400)
RBC: 3.91 MIL/uL (ref 3.87–5.11)
RDW: 13.1 % (ref 11.5–15.5)
WBC: 5 10*3/uL (ref 4.0–10.5)

## 2014-03-29 LAB — GLUCOSE, CAPILLARY
Glucose-Capillary: 121 mg/dL — ABNORMAL HIGH (ref 70–99)
Glucose-Capillary: 147 mg/dL — ABNORMAL HIGH (ref 70–99)
Glucose-Capillary: 172 mg/dL — ABNORMAL HIGH (ref 70–99)
Glucose-Capillary: 124 mg/dL — ABNORMAL HIGH (ref 70–99)

## 2014-03-29 LAB — HEPARIN LEVEL (UNFRACTIONATED): Heparin Unfractionated: 0.38 IU/mL (ref 0.30–0.70)

## 2014-03-29 LAB — TROPONIN I
Troponin I: <0.3 ng/mL (ref <0.30)
Troponin I: <0.3 ng/mL (ref <0.30)

## 2014-03-29 MED ORDER — POTASSIUM CHLORIDE CRYS ER 20 MEQ PO TBCR
20.0000 meq | EXTENDED_RELEASE_TABLET | Freq: Every day | ORAL | Status: DC
  Administered 2014-03-29 – 2014-03-31 (×4): 20 meq via ORAL
  Filled 2014-03-29 (×4): qty 1

## 2014-03-29 MED ORDER — DILTIAZEM HCL ER COATED BEADS 240 MG PO CP24
240.0000 mg | ORAL_CAPSULE | Freq: Every day | ORAL | Status: DC
  Administered 2014-03-29 – 2014-03-30 (×2): 240 mg via ORAL
  Filled 2014-03-29 (×3): qty 1

## 2014-03-29 MED ORDER — APIXABAN 5 MG PO TABS
5.0000 mg | ORAL_TABLET | Freq: Two times a day (BID) | ORAL | Status: DC
  Administered 2014-03-29 – 2014-03-31 (×5): 5 mg via ORAL
  Filled 2014-03-29 (×6): qty 1

## 2014-03-29 NOTE — Progress Notes (Signed)
Patient Potassium 3.5. MD T.Turner notified, ordered to give daily dose of potassium 20 Meq. Will continue to monitor patient.

## 2014-03-29 NOTE — Progress Notes (Signed)
HR up in 130's-140's, with activity (brushing teeth and up to bathroom).  Pt states she does not feel any different and is not SOB, does not have CP, can not feel any palpitations.  CPAP ordered from RT and in use at bedside.  Pt states, "I am going to rest so that my heart rate can go down and get back into rhythm so they don't have to shock me tomorrow."  Will continue to monitor.

## 2014-03-29 NOTE — Progress Notes (Signed)
Subjective: No complaints   Objective: Vital signs in last 24 hours: Temp:  [97.9 F (36.6 C)-98.2 F (36.8 C)] 97.9 F (36.6 C) (05/17 0500) Pulse Rate:  [82-150] 82 (05/17 0500) Resp:  [16-21] 16 (05/17 0500) BP: (121-148)/(66-91) 126/66 mmHg (05/17 0500) SpO2:  [93 %-100 %] 93 % (05/17 0500) Weight:  [198 lb 8 oz (90.039 kg)-199 lb (90.266 kg)] 198 lb 8 oz (90.039 kg) (05/16 2052) Last BM Date: 03/28/14  Intake/Output from previous day:   Intake/Output this shift: Total I/O In: 240 [P.O.:240] Out: -   Medications Current Facility-Administered Medications  Medication Dose Route Frequency Provider Last Rate Last Dose  . atorvastatin (LIPITOR) tablet 40 mg  40 mg Oral q1800 Sueanne Margarita, MD      . diltiazem (CARDIZEM) 100 mg in dextrose 5 % 100 mL infusion  5-15 mg/hr Intravenous Continuous Jasper Riling. Pickering, MD 5 mL/hr at 03/28/14 1857 5 mg/hr at 03/28/14 1857  . heparin ADULT infusion 100 units/mL (25000 units/250 mL)  1,100 Units/hr Intravenous Continuous Georgina Peer, [facility] 11 mL/hr at 03/28/14 2314 1,100 Units/hr at 03/28/14 2314  . losartan (COZAAR) tablet 50 mg  50 mg Oral Daily Sherren Mocha, MD       And  . hydrochlorothiazide (MICROZIDE) capsule 12.5 mg  12.5 mg Oral Daily Sherren Mocha, MD      . latanoprost (XALATAN) 0.005 % ophthalmic solution 1 drop  1 drop Both Eyes QHS Sueanne Margarita, MD   1 drop at 03/28/14 2303  . levothyroxine (SYNTHROID, LEVOTHROID) tablet 137 mcg  137 mcg Oral QAC breakfast Sueanne Margarita, MD      . LORazepam (ATIVAN) tablet 1.5 mg  1.5 mg Oral QHS Sueanne Margarita, MD   1.5 mg at 03/28/14 2302  . magnesium oxide (MAG-OX) tablet 400 mg  400 mg Oral BID Sherren Mocha, MD   400 mg at 03/28/14 2303  . metFORMIN (GLUCOPHAGE-XR) 24 hr tablet 500 mg  500 mg Oral Q supper Sherren Mocha, MD      . metFORMIN (GLUCOPHAGE-XR) 24 hr tablet 750 mg  750 mg Oral Q breakfast Sherren Mocha, MD   750 mg at 03/29/14 0817  . multivitamin-lutein  (OCUVITE-LUTEIN) capsule 1 capsule  1 capsule Oral Daily Sueanne Margarita, MD      . pantoprazole (PROTONIX) EC tablet 40 mg  40 mg Oral Daily Sueanne Margarita, MD      . potassium chloride SA (K-DUR,KLOR-CON) CR tablet 20 mEq  20 mEq Oral Daily Sueanne Margarita, MD   20 mEq at 03/29/14 0036  . sertraline (ZOLOFT) tablet 50 mg  50 mg Oral Daily Sueanne Margarita, MD        PE: General appearance: alert, cooperative and no distress Lungs: clear to auscultation bilaterally Heart: irregularly irregular rhythm and 1/6 sys MM Extremities: No LEE Pulses: 2+ and symmetric Skin: Warm and dry Neurologic: Grossly normal  Lab Results:   Recent Labs  03/28/14 1750 03/28/14 2150 03/29/14 0545  WBC 4.8 4.4 5.0  HGB 12.1 11.4* 11.5*  HCT 36.6 34.1* 34.5*  PLT 162 158 158   BMET  Recent Labs  03/28/14 1750 03/28/14 2150 03/29/14 0545  NA 143 142 142  K 3.8 3.5* 3.8  CL 104 104 107  CO2 23 24 25   GLUCOSE 125* 127* 125*  BUN 28* 25* 19  CREATININE 0.87 0.78 0.80  CALCIUM 9.5 9.0 8.8   PT/INR  Recent Labs  03/28/14 2150  LABPROT 13.9  INR 1.09    Assessment/Plan This is a very pleasant 80yo WF with a remote history of heart murmur followed by Dr. Glade Lloyd in the past but no other cardiac disease except HTN who presented to the ER with complaints of palpitations. She has been under a lot of stress. Her daughter found out she might have colon CA and her niece just had a CVA 3 days ago. She has been worrying a lot. She also has sleep apnea and is on CPAP and apparently felt a fluttering in her CPAP nasal pillow mask and she thought the machine was broken. She then got concerned that it might be her heart and felt her pulse and could feel her heart racing and beating irregular. This occurred yesterday and last night and then again this am. She went to the [facility] walkin clinic and was found to be in afib with RVR. She has been having some dizziness and unsteadiness on her feet for 6 months. She  denies any SOB or chest pain. 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 She was sent here for further treatment from [facility] clinic.  Active Problems: 1. New onset atrial fibrillation/flutter with RVR of unknown duration. 2. Hypothyroidism - TSH is normal  3. HTN well controlled  4. OSA on CPAP   Plan:  She continues in afib with borderline control through the night.  She is in the 80's currently on IV dilt at 10mg /hr and heparin.  Consider changing to Eliquis 5mg  bid( >80yo, >60kg, SCr <1.5).  It sounds like she is pretty active and stable on her feet.  Low fall risk.  DC echo and put on for TEE/DCCV  She seems relatively asymptomatic(eager to go home).   Change to PO Cardizem 240mg .    LOS: 1 day    Tarri Fuller PA-C 03/29/2014 8:55 AM

## 2014-03-29 NOTE — Progress Notes (Addendum)
Pt. Seen and examined. Agree with the NP/PA-C note as written.  Rate control is improved, she feels better but is really anxious. Wants to go home. We discussed options including anticoagulation, rate control for 1 month and cardioversion at that time if she remains in a-fib versus TEE/Cardioversion tomorrow - she is agreeable to TEE approach. Agree with change to po cardizem today. Keep NPO p MN for TEE/Cardioversion hopefully tomorrow.  Will hold off on 2D echo tomorrow since we are pursuing a TEE approach - her murmur can be further evaluated.  Pixie Casino, MD, [facility] Attending Cardiologist [place]

## 2014-03-29 NOTE — Progress Notes (Signed)
[place] for heparin Indication: atrial fibrillation  Allergies  Allergen Reactions  . Penicillins Itching and Rash    Patient Measurements: Height: 5' 6.5" (168.9 cm) Weight: 198 lb 8 oz (90.039 kg) IBW/kg (Calculated) : 60.45 Heparin Dosing Weight: 80kg  Vital Signs: Temp: 97.9 F (36.6 C) (05/17 0500) Temp src: Oral (05/16 2052) BP: 126/66 mmHg (05/17 0500) Pulse Rate: 82 (05/17 0500)  Labs:  Recent Labs  03/28/14 1750 03/28/14 2150 03/29/14 0545  HGB 12.1 11.4* 11.5*  HCT 36.6 34.1* 34.5*  PLT 162 158 158  APTT  --  28  --   LABPROT  --  13.9  --   INR  --  1.09  --   HEPARINUNFRC  --   --  0.38  CREATININE 0.87 0.78  --   TROPONINI  --  <0.30  --     Estimated Creatinine Clearance: 62.9 ml/min (by C-G formula based on Cr of 0.78).  Assessment: 80 year old female with afib for heparin Goal of Therapy:  Heparin level 0.3-0.7 units/ml Monitor platelets by anticoagulation protocol: Yes   Plan:  Continue Heparin at current rate  Phillis Knack, PharmD, BCPS  03/29/2014 6:42 AM

## 2014-03-30 ENCOUNTER — Encounter (HOSPITAL_COMMUNITY)
Admission: EM | Disposition: A | Discharge status: Home / Self Care | Payer: Self-pay | Source: Home / Self Care | Attending: Cardiovascular Disease

## 2014-03-30 ENCOUNTER — Inpatient Hospital Stay (HOSPITAL_COMMUNITY): Payer: Medicare Other | Admitting: Certified Registered"

## 2014-03-30 ENCOUNTER — Encounter (HOSPITAL_COMMUNITY): Payer: Medicare Other | Admitting: Certified Registered"

## 2014-03-30 ENCOUNTER — Encounter (HOSPITAL_COMMUNITY): Payer: Self-pay | Admitting: Anesthesiology

## 2014-03-30 ENCOUNTER — Encounter (HOSPITAL_COMMUNITY): Payer: Self-pay | Admitting: Certified Registered"

## 2014-03-30 DIAGNOSIS — I059 Rheumatic mitral valve disease, unspecified: Secondary | ICD-10-CM

## 2014-03-30 DIAGNOSIS — E119 Type 2 diabetes mellitus without complications: Secondary | ICD-10-CM | POA: Diagnosis present

## 2014-03-30 DIAGNOSIS — Z7901 Long term (current) use of anticoagulants: Secondary | ICD-10-CM

## 2014-03-30 DIAGNOSIS — I10 Essential (primary) hypertension: Secondary | ICD-10-CM

## 2014-03-30 HISTORY — PX: CARDIOVERSION: SHX1299

## 2014-03-30 HISTORY — DX: Long term (current) use of anticoagulants: Z79.01

## 2014-03-30 HISTORY — PX: TEE WITHOUT CARDIOVERSION: SHX5443

## 2014-03-30 LAB — BASIC METABOLIC PANEL
BUN: 18 mg/dL (ref 6–23)
CHLORIDE: 103 meq/L (ref 96–112)
CO2: 25 mEq/L (ref 19–32)
Calcium: 8.9 mg/dL (ref 8.4–10.5)
Creatinine, Ser: 0.77 mg/dL (ref 0.50–1.10)
GFR calc Af Amer: 89 mL/min — ABNORMAL LOW (ref >90)
GFR calc non Af Amer: 77 mL/min — ABNORMAL LOW (ref >90)
GLUCOSE: 134 mg/dL — AB (ref 70–99)
Potassium: 3.7 mEq/L (ref 3.7–5.3)
Sodium: 140 mEq/L (ref 137–147)

## 2014-03-30 LAB — CBC
HEMATOCRIT: 36 % (ref 36.0–46.0)
HEMOGLOBIN: 12 g/dL (ref 12.0–15.0)
MCH: 29.5 pg (ref 26.0–34.0)
MCHC: 33.3 g/dL (ref 30.0–36.0)
MCV: 88.5 fL (ref 78.0–100.0)
Platelets: 172 10*3/uL (ref 150–400)
RBC: 4.07 MIL/uL (ref 3.87–5.11)
RDW: 13.1 % (ref 11.5–15.5)
WBC: 5.8 10*3/uL (ref 4.0–10.5)

## 2014-03-30 LAB — GLUCOSE, CAPILLARY
GLUCOSE-CAPILLARY: 145 mg/dL — AB (ref 70–99)
GLUCOSE-CAPILLARY: 144 mg/dL — AB (ref 70–99)
Glucose-Capillary: 122 mg/dL — ABNORMAL HIGH (ref 70–99)
Glucose-Capillary: 165 mg/dL — ABNORMAL HIGH (ref 70–99)

## 2014-03-30 SURGERY — ECHOCARDIOGRAM, TRANSESOPHAGEAL
Anesthesia: Monitor Anesthesia Care

## 2014-03-30 SURGERY — CARDIOVERSION
Anesthesia: Monitor Anesthesia Care

## 2014-03-30 SURGERY — ECHOCARDIOGRAM, TRANSESOPHAGEAL
Anesthesia: Moderate Sedation

## 2014-03-30 MED ORDER — PROPOFOL 10 MG/ML IV BOLUS
INTRAVENOUS | Status: DC | PRN
  Administered 2014-03-30: 30 mg via INTRAVENOUS

## 2014-03-30 MED ORDER — FENTANYL CITRATE 0.05 MG/ML IJ SOLN
INTRAMUSCULAR | Status: AC
  Filled 2014-03-30: qty 5

## 2014-03-30 MED ORDER — LACTATED RINGERS IV SOLN
INTRAVENOUS | Status: DC | PRN
  Administered 2014-03-30: 09:00:00 via INTRAVENOUS

## 2014-03-30 MED ORDER — MIDAZOLAM HCL 5 MG/5ML IJ SOLN
INTRAMUSCULAR | Status: DC | PRN
  Administered 2014-03-30: 2 mg via INTRAVENOUS

## 2014-03-30 MED ORDER — MIDAZOLAM HCL 2 MG/2ML IJ SOLN
INTRAMUSCULAR | Status: AC
  Filled 2014-03-30: qty 2

## 2014-03-30 MED ORDER — SODIUM CHLORIDE 0.9 % IV SOLN
INTRAVENOUS | Status: DC
  Administered 2014-03-30: 500 mL via INTRAVENOUS

## 2014-03-30 MED ORDER — FENTANYL CITRATE 0.05 MG/ML IJ SOLN
INTRAMUSCULAR | Status: DC | PRN
  Administered 2014-03-30: 50 ug via INTRAVENOUS

## 2014-03-30 NOTE — Progress Notes (Signed)
RT went to place pt on BiPAP however pt was already wearing BiPAP and sleeping comfortably. RT will continue to monitor as needed.

## 2014-03-30 NOTE — Anesthesia Postprocedure Evaluation (Signed)
  Anesthesia Post-op Note  Patient: [name]  Procedure(s) Performed: Procedure(s): CARDIOVERSION - BEDSIDE (N/A)  Patient Location: PACU  Anesthesia Type:MAC  Level of Consciousness: awake, alert , oriented and patient cooperative  Airway and Oxygen Therapy: Patient Spontanous Breathing  Post-op Pain: none  Post-op Assessment: Post-op Vital signs reviewed, Patient's Cardiovascular Status Stable, Respiratory Function Stable, Patent Airway, No signs of Nausea or vomiting and Adequate PO intake  Post-op Vital Signs: stable  Last Vitals:  Filed Vitals:   03/30/14 1015  BP: 143/95  Pulse: 69  Temp:   Resp: 26    Complications: No apparent anesthesia complications

## 2014-03-30 NOTE — Anesthesia Preprocedure Evaluation (Signed)
Anesthesia Evaluation  Patient identified by MRN, date of birth, ID band Patient awake    Reviewed: Allergy & Precautions, H&P , NPO status , Patient's Chart, lab work & pertinent test results  Airway Mallampati: II TM Distance: >3 FB Neck ROM: Full    Dental  (+) Teeth Intact, Dental Advisory Given   Pulmonary          Cardiovascular hypertension, Pt. on medications     Neuro/Psych    GI/Hepatic   Endo/Other  diabetes, Type 2, Oral Hypoglycemic Agents  Renal/GU      Musculoskeletal   Abdominal   Peds  Hematology   Anesthesia Other Findings   Reproductive/Obstetrics                           Anesthesia Physical Anesthesia Plan  ASA: III  Anesthesia Plan: MAC   Post-op Pain Management:    Induction: Intravenous  Airway Management Planned: Nasal Cannula  Additional Equipment: None  Intra-op Plan:   Post-operative Plan:   Informed Consent: I have reviewed the patients History and Physical, chart, labs and discussed the procedure including the risks, benefits and alternatives for the proposed anesthesia with the patient or authorized representative who has indicated his/her understanding and acceptance.   Dental advisory given  Plan Discussed with: CRNA, Anesthesiologist and Surgeon  Anesthesia Plan Comments:         Anesthesia Quick Evaluation

## 2014-03-30 NOTE — Discharge Instructions (Addendum)
Information on my medicine - ELIQUIS (apixaban)  This medication education was reviewed with me or my healthcare representative as part of my discharge preparation.  The pharmacist that spoke with me during my hospital stay was:  Brain Hilts, [facility]  Why was Eliquis prescribed for you? Eliquis was prescribed for you to reduce the risk of a blood clot forming that can cause a stroke if you have a medical condition called atrial fibrillation (a type of irregular heartbeat).  What do You need to know about Eliquis ? Take your Eliquis TWICE DAILY - one tablet in the morning and one tablet in the evening with or without food. If you have difficulty swallowing the tablet whole please discuss with your pharmacist how to take the medication safely.  Take Eliquis exactly as prescribed by your doctor and DO NOT stop taking Eliquis without talking to the doctor who prescribed the medication.  Stopping may increase your risk of developing a stroke.  Refill your prescription before you run out.  After discharge, you should have regular check-up appointments with your healthcare provider that is prescribing your Eliquis.  In the future your dose may need to be changed if your kidney function or weight changes by a significant amount or as you get older.  What do you do if you miss a dose? If you miss a dose, take it as soon as you remember on the same day and resume taking twice daily.  Do not take more than one dose of ELIQUIS at the same time to make up a missed dose.  Important Safety Information A possible side effect of Eliquis is bleeding. You should call your healthcare provider right away if you experience any of the following:   Bleeding from an injury or your nose that does not stop.   Unusual colored urine (red or dark brown) or unusual colored stools (red or black).   Unusual bruising for unknown reasons.   A serious fall or if you hit your head (even if there is no  bleeding).  Some medicines may interact with Eliquis and might increase your risk of bleeding or clotting while on Eliquis. To help avoid this, consult your healthcare provider or pharmacist prior to using any new prescription or non-prescription medications, including herbals, vitamins, non-steroidal anti-inflammatory drugs (NSAIDs) and supplements.  This website has more information on Eliquis (apixaban): [email or web address]. Atrial Fibrillation Atrial fibrillation is a condition that causes your heart to beat irregularly. It may also cause your heart to beat faster than normal. Atrial fibrillation can prevent your heart from pumping blood normally. It increases your risk of stroke and heart problems. HOME CARE  Take medications as told by your doctor.  Only take medications that your doctor says are safe. Some medications can make the condition worse or happen again.  If blood thinners were prescribed by your doctor, take them exactly as told. Too much can cause bleeding. Too little and you will not have the needed protection against stroke and other problems.  Perform blood tests at home if told by your doctor.  Perform blood tests exactly as told by your doctor.  Do not drink alcohol.  Do not drink beverages with caffeine such as coffee, soda, and some teas.  Maintain a healthy weight.  Do not use diet pills unless your doctor says they are safe. They may make heart problems worse.  Follow diet instructions as told by your doctor.  Exercise regularly as told by your doctor.  Keep all  follow-up appointments. GET HELP RIGHT AWAY IF:   You have chest or belly (abdominal) pain.  You feel sick to your stomach (nauseous)  You suddenly have swollen feet and ankles.  You feel dizzy.  You face, arms, or legs feel numb or weak.  There is a change in your vision or speech.  You notice a change in the speed, rhythm, or strength of your heartbeat.  You suddenly begin peeing  (urinating) more often.  You get tired more easily when moving or exercising. MAKE SURE YOU:   Understand these instructions.  Will watch your condition.  Will get help right away if you are not doing well or get worse. Document Released: 08/08/2008 Document Revised: 02/24/2013 Document Reviewed: 12/10/2012 [facility] Patient Information 2014 [place].   Cardiac Diet This diet can help prevent heart disease and stroke. Many factors influence your heart health, including eating and exercise habits. Coronary risk rises a lot with abnormal blood fat (lipid) levels. Cardiac meal planning includes limiting unhealthy fats, increasing healthy fats, and making other small dietary changes. General guidelines are as follows:  Adjust calorie intake to reach and maintain desirable body weight.  Limit total fat intake to less than 30% of total calories. Saturated fat should be less than 7% of calories.  Saturated fats are found in animal products and in some vegetable products. Saturated vegetable fats are found in coconut oil, cocoa butter, palm oil, and palm kernel oil. Read labels carefully to avoid these products as much as possible. Use butter in moderation. Choose tub margarines and oils that have 2 grams of fat or less. Good cooking oils are canola and olive oils.  Practice low-fat cooking techniques. Do not fry food. Instead, broil, bake, boil, steam, grill, roast on a rack, stir-fry, or microwave it. Other fat reducing suggestions include:  Remove the skin from poultry.  Remove all visible fat from meats.  Skim the fat off stews, soups, and gravies before serving them.  Steam vegetables in water or broth instead of sauting them in fat.  Avoid foods with trans fat (or hydrogenated oils), such as commercially fried foods and commercially baked goods. Commercial shortening and deep-frying fats will contain trans fat.  Increase intake of fruits, vegetables, whole grains, and legumes  to replace foods high in fat.  Increase consumption of nuts, legumes, and seeds to at least 4 servings weekly. One serving of a legume equals  cup, and 1 serving of nuts or seeds equals  cup.  Choose whole grains more often. Have 3 servings per day (a serving is 1 ounce [oz]).  Eat 4 to 5 servings of vegetables per day. A serving of vegetables is 1 cup of raw leafy vegetables;  cup of raw or cooked cut-up vegetables;  cup of vegetable juice.  Eat 4 to 5 servings of fruit per day. A serving of fruit is 1 medium whole fruit;  cup of dried fruit;  cup of fresh, frozen, or canned fruit;  cup of 100% fruit juice.  Increase your intake of dietary fiber to 20 to 30 grams per day. Insoluble fiber may help lower your risk of heart disease and may help curb your appetite.  Soluble fiber binds cholesterol to be removed from the blood. Foods high in soluble fiber are dried beans, citrus fruits, oats, apples, bananas, broccoli, Brussels sprouts, and eggplant.  Try to include foods fortified with plant sterols or stanols, such as yogurt, breads, juices, or margarines. Choose several fortified foods to achieve a  daily intake of 2 to 3 grams of plant sterols or stanols.  Foods with omega-3 fats can help reduce your risk of heart disease. Aim to have a 3.5 oz portion of fatty fish twice per week, such as salmon, mackerel, albacore tuna, sardines, lake trout, or herring. If you wish to take a fish oil supplement, choose one that contains 1 gram of both DHA and EPA.  Limit processed meats to 2 servings (3 oz portion) weekly.  Limit the sodium in your diet to 1500 milligrams (mg) per day. If you have high blood pressure, talk to a registered dietitian about a DASH (Dietary Approaches to Stop Hypertension) eating plan.  Limit sweets and beverages with added sugar, such as soda, to no more than 5 servings per week. One serving is:   1 tablespoon sugar.  1 tablespoon jelly or jam.   cup sorbet.  1 cup  lemonade.   cup regular soda. CHOOSING FOODS Starches  Allowed: Breads: All kinds (wheat, rye, raisin, white, oatmeal, [place], [place], and English muffin bread). Low-fat rolls: English muffins, frankfurter and hamburger buns, bagels, pita bread, tortillas (not fried). Pancakes, waffles, biscuits, and muffins made with recommended oil.  Avoid: Products made with saturated or trans fats, oils, or whole milk products. Butter rolls, cheese breads, croissants. Commercial doughnuts, muffins, sweet rolls, biscuits, waffles, pancakes, store-bought mixes. Crackers  Allowed: Low-fat crackers and snacks: Animal, graham, rye, saltine (with recommended oil, no lard), oyster, and matzo crackers. Bread sticks, melba toast, rusks, flatbread, pretzels, and light popcorn.  Avoid: High-fat crackers: cheese crackers, butter crackers, and those made with coconut, palm oil, or trans fat (hydrogenated oils). Buttered popcorn. Cereals  Allowed: Hot or cold whole-grain cereals.  Avoid: Cereals containing coconut, hydrogenated vegetable fat, or animal fat. Potatoes / Pasta / Rice  Allowed: All kinds of potatoes, rice, and pasta (such as macaroni, spaghetti, and noodles).  Avoid: Pasta or rice prepared with cream sauce or high-fat cheese. Chow mein noodles, [place] fries. Vegetables  Allowed: All vegetables and vegetable juices.  Avoid: Fried vegetables. Vegetables in cream, butter, or high-fat cheese sauces. Limit coconut. Fruit in cream or custard. Protein  Allowed: Limit your intake of meat, seafood, and poultry to no more than 6 oz (cooked weight) per day. All lean, well-trimmed beef, veal, pork, and lamb. All chicken and [place] without skin. All fish and shellfish. Wild game: wild duck, rabbit, pheasant, and venison. Egg whites or low-cholesterol egg substitutes may be used as desired. Meatless dishes: recipes with dried beans, peas, lentils, and tofu (soybean curd). Seeds and nuts: all seeds and most  nuts.  Avoid: Prime grade and other heavily marbled and fatty meats, such as short ribs, spare ribs, rib eye roast or steak, frankfurters, sausage, bacon, and high-fat luncheon meats, mutton. Caviar. Commercially fried fish. Domestic duck, goose, venison sausage. Organ meats: liver, gizzard, heart, chitterlings, brains, kidney, sweetbreads. Dairy  Allowed: Low-fat cheeses: nonfat or low-fat cottage cheese (1% or 2% fat), cheeses made with part skim milk, such as mozzarella, farmers, string, or ricotta. (Cheeses should be labeled no more than 2 to 6 grams fat per oz.). Skim (or 1%) milk: liquid, powdered, or evaporated. Buttermilk made with low-fat milk. Drinks made with skim or low-fat milk or cocoa. Chocolate milk or cocoa made with skim or low-fat (1%) milk. Nonfat or low-fat yogurt.  Avoid: Whole milk cheeses, including colby, cheddar, mu[place], Monterey Jack, [place], [place], [place], American, Swiss, and blue. Creamed cottage cheese, cream cheese. Whole milk and whole milk  products, including buttermilk or yogurt made from whole milk, drinks made from whole milk. Condensed milk, evaporated whole milk, and 2% milk. Soups and Combination Foods  Allowed: Low-fat low-sodium soups: broth, dehydrated soups, homemade broth, soups with the fat removed, homemade cream soups made with skim or low-fat milk. Low-fat spaghetti, lasagna, chili, and Spanish rice if low-fat ingredients and low-fat cooking techniques are used.  Avoid: Cream soups made with whole milk, cream, or high-fat cheese. All other soups. Desserts and Sweets  Allowed: Sherbet, fruit ices, gelatins, meringues, and angel food cake. Homemade desserts with recommended fats, oils, and milk products. Jam, jelly, honey, marmalade, sugars, and syrups. Pure sugar candy, such as gum drops, hard candy, jelly beans, marshmallows, mints, and small amounts of dark chocolate.  Avoid: Commercially prepared cakes, pies, cookies, frosting, pudding, or  mixes for these products. Desserts containing whole milk products, chocolate, coconut, lard, palm oil, or palm kernel oil. Ice cream or ice cream drinks. Candy that contains chocolate, coconut, butter, hydrogenated fat, or unknown ingredients. Buttered syrups. Fats and Oils  Allowed: Vegetable oils: safflower, sunflower, corn, soybean, cottonseed, sesame, canola, olive, or peanut. Non-hydrogenated margarines. Salad dressing or mayonnaise: homemade or commercial, made with a recommended oil. Low or nonfat salad dressing or mayonnaise.  Limit added fats and oils to 6 to 8 tsp per day (includes fats used in cooking, baking, salads, and spreads on bread). Remember to count the "hidden fats" in foods.  Avoid: Solid fats and shortenings: butter, lard, salt pork, bacon drippings. Gravy containing meat fat, shortening, or suet. Cocoa butter, coconut. Coconut oil, palm oil, palm kernel oil, or hydrogenated oils: these ingredients are often used in bakery products, nondairy creamers, whipped toppings, candy, and commercially fried foods. Read labels carefully. Salad dressings made of unknown oils, sour cream, or cheese, such as blue cheese and Roquefort. Cream, all kinds: half-and-half, light, heavy, or whipping. Sour cream or cream cheese (even if "light" or low-fat). Nondairy cream substitutes: coffee creamers and sour cream substitutes made with palm, palm kernel, hydrogenated oils, or coconut oil. Beverages  Allowed: Coffee (regular or decaffeinated), tea. Diet carbonated beverages, mineral water. Alcohol: Check with your caregiver. Moderation is recommended.  Avoid: Whole milk, regular sodas, and juice drinks with added sugar. Condiments  Allowed: All seasonings and condiments. Cocoa powder. "Cream" sauces made with recommended ingredients.  Avoid: Carob powder made with hydrogenated fats. SAMPLE MENU Breakfast   cup orange juice   cup oatmeal  1 slice toast  1 tsp margarine  1 cup skim  milk Lunch  [place] sandwich with 2 oz [place], 2 slices bread  Lettuce and tomato slices  Fresh fruit  Carrot sticks  Coffee or tea Snack  Fresh fruit or low-fat crackers Dinner  3 oz lean ground beef  1 baked potato  1 tsp margarine   cup asparagus  Lettuce salad  1 tbs non-creamy dressing   cup peach slices  1 cup skim milk Document Released: 08/08/2008 Document Revised: 04/30/2012 Document Reviewed: 01/23/2012 [employer] Patient Information 2014 [place], [place].

## 2014-03-30 NOTE — Transfer of Care (Signed)
Immediate Anesthesia Transfer of Care Note  Patient: [name]  Procedure(s) Performed: Procedure(s): CARDIOVERSION - BEDSIDE (N/A)  Patient Location: Endoscopy Unit  Anesthesia Type:MAC  Level of Consciousness: awake  Airway & Oxygen Therapy: Patient Spontanous Breathing and Patient connected to nasal cannula oxygen  Post-op Assessment: Report given to PACU RN, Post -op Vital signs reviewed and stable and Patient moving all extremities  Post vital signs: Reviewed and stable  Complications: No apparent anesthesia complications

## 2014-03-30 NOTE — OR Nursing (Signed)
Cancelled Cardioversion due to the fact she didn't have 4 doses of apixaban  Cardiversion will be scheduled at bedside 03/31/2014  No thrombus noted on TEE

## 2014-03-30 NOTE — Interval H&P Note (Signed)
History and Physical Interval Note:  03/30/2014 9:20 AM  [name]  has presented today for surgery, with the diagnosis of A FIB  The various methods of treatment have been discussed with the patient and family. After consideration of risks, benefits and other options for treatment, the patient has consented to  Procedure(s): CARDIOVERSION - BEDSIDE (N/A) as a surgical intervention .  The patient's history has been reviewed, patient examined, no change in status, stable for surgery.  I have reviewed the patient's chart and labs.  Questions were answered to the patient's satisfaction.     [name]

## 2014-03-30 NOTE — Care Management Note (Addendum)
    Page 1 of 2   03/31/2014     2:54:13 PM CARE MANAGEMENT NOTE 03/31/2014  Patient:  [name],[name]   Account Number:  [id number]  Date Initiated:  03/30/2014  Documentation initiated by:  GRAVES-BIGELOW,Velva Molinari  Subjective/Objective Assessment:   Pt admitted for afib with RVR. Initiated on IV Cardizem gtt for rate control. Plan for cardioversion 03-30-14.     Action/Plan:   CM will continue to monitor for disposition needs.   Anticipated DC Date:  03/31/2014   Anticipated DC Plan:  [place]  CM consult      Choice offered to / List presented to:             Status of service:  Completed, signed off Medicare Important Message given?  NA - LOS <3 / Initial given by admissions (If response is "NO", the following Medicare IM given date fields will be blank) Date Medicare IM given:   Date Additional Medicare IM given:    Discharge Disposition:  HOME/SELF CARE  Per UR Regulation:  Reviewed for med. necessity/level of care/duration of stay  If discussed at [place] of Stay Meetings, dates discussed:    Comments:    03-31-14 [address] Jacqlyn Krauss, RN,BSN [phone] CM did call [facility] and medication is available. CM  provided pt with 30 day free card. Pt will need Rx for 30 day free no refill and the original Rx with refills.     Benefits Check:  Pt will need prior authorization for Eliquis.  COVER -YES CO-PAY - $ 30.00 FOR [name] 30 DAY SUPPLY PRIOR APPROVAL - YES # [phone] TIER- 2 DRUG  *APIXABAN 5 MG  COVER - YES CO-PAY - $ 30.00 FOR 30 DAYS SUPPLY PRIOR APPROVAL- YES  # [phone] TIER- 2 DRUG  PHARMACY : [facility] , [facility], [facility], [facility], [facility]

## 2014-03-30 NOTE — Progress Notes (Signed)
Patient Name: [name] Date of Encounter: 03/30/2014  Active Problems:   Atrial fibrillation with RVR    Patient Profile: 80 yo female w/ no CAD/Arrhythmia hx, hx HTN, SEM, DM, OSA on CPAP, CA. Admitted 05/16 w/ palpitations, afib, RVR.   SUBJECTIVE: Feels much better than admission  OBJECTIVE Filed Vitals:   03/29/14 1432 03/29/14 1724 03/29/14 2100 03/30/14 0500  BP: 107/49  122/55 132/78  Pulse: 73 89 91 80  Temp: 97.7 F (36.5 C)  98.5 F (36.9 C) 97.9 F (36.6 C)  TempSrc: Oral     Resp: 19 20 18 18   Height:      Weight:    197 lb (89.359 kg)  SpO2: 97% 98% 98% 95%    Intake/Output Summary (Last 24 hours) at 03/30/14 0758 Last data filed at 03/29/14 1812  Gross per 24 hour  Intake    640 ml  Output    250 ml  Net    390 ml   Filed Weights   03/28/14 1727 03/28/14 2052 03/30/14 0500  Weight: 199 lb (90.266 kg) 198 lb 8 oz (90.039 kg) 197 lb (89.359 kg)    PHYSICAL EXAM General: Well developed, well nourished, female in no acute distress. Head: Normocephalic, atraumatic.  Neck: Supple without bruits, JVD not elevated. Lungs:  Resp regular and unlabored, few dry rales. Heart: irregular R&R, S1, S2, no S3, S4, 2/6 murmur; no rub. Abdomen: Soft, non-tender, non-distended, BS + x 4.  Extremities: No clubbing, cyanosis, no edema.  Neuro: Alert and oriented X 3. Moves all extremities spontaneously. Psych: Normal affect.  LABS: CBC: Recent Labs  03/28/14 1750 03/28/14 2150 03/29/14 0545 03/30/14 0520  WBC 4.8 4.4 5.0 5.8  NEUTROABS 3.1 2.2  --   --   HGB 12.1 11.4* 11.5* 12.0  HCT 36.6 34.1* 34.5* 36.0  MCV 89.3 87.7 88.2 88.5  PLT 162 158 158 172   INR: Recent Labs  03/28/14 2150  INR 0.96   Basic Metabolic Panel: Recent Labs  03/28/14 1750 03/28/14 2150 03/29/14 0545 03/30/14 0520  NA 143 142 142 140  K 3.8 3.5* 3.8 3.7  CL 104 104 107 103  CO2 23 24 25 25   GLUCOSE 125* 127* 125* 134*  BUN 28* 25* 19 18  CREATININE 0.87  0.78 0.80 0.77  CALCIUM 9.5 9.0 8.8 8.9  MG 1.8 1.8  --   --    Liver Function Tests: Recent Labs  03/28/14 2150  AST 15  ALT 15  ALKPHOS 70  BILITOT 0.4  PROT 6.2  ALBUMIN 3.3*   Cardiac Enzymes: Recent Labs  03/28/14 2150 03/29/14 0548 03/29/14 0953  TROPONINI <0.30 <0.30 <0.30   Thyroid Function Tests: Recent Labs  03/28/14 1750  TSH 1.130   TELE: Atrial fib, RVR at times       Radiology/Studies: Dg Chest Port 1 View  03/28/2014   CLINICAL DATA:  Shortness of breath  EXAM: PORTABLE CHEST - 1 VIEW  COMPARISON:  September 25, 2013  FINDINGS: The mediastinal contour is normal. The heart size is enlarged. The aorta is tortuous. There is no focal infiltrate, pulmonary edema, or pleural effusion. There is hiatal hernia. Surgical clips are identified in the right axilla unchanged. No acute abnormalities identified within the visualized bones.  IMPRESSION: No active cardiopulmonary disease.   Electronically Signed   By: Abelardo Diesel M.D.   On: 03/28/2014 18:34     Current Medications:  . apixaban  5 mg Oral  BID  . atorvastatin  40 mg Oral q1800  . diltiazem  240 mg Oral Daily  . losartan  50 mg Oral Daily   And  . hydrochlorothiazide  12.5 mg Oral Daily  . latanoprost  1 drop Both Eyes QHS  . levothyroxine  137 mcg Oral QAC breakfast  . LORazepam  1.5 mg Oral QHS  . magnesium oxide  400 mg Oral BID  . metFORMIN  500 mg Oral Q supper  . metFORMIN  750 mg Oral Q breakfast  . multivitamin-lutein  1 capsule Oral Daily  . pantoprazole  40 mg Oral Daily  . potassium chloride  20 mEq Oral Daily  . sertraline  50 mg Oral Daily   . diltiazem (CARDIZEM) infusion Stopped (03/29/14 1230)    ASSESSMENT AND PLAN: Active Problems:   Atrial fibrillation with RVR - for TEE/DCCV today, continue Cardizem     Anticoagulation - continue Eliquis    DM - give Glucophage after diet resumed. Encourage compliance    HTN - OK on current Rx  Plan - MD advise on d/c later today if  all goes well.   Signed, Lonn Georgia , PA-C 7:58 AM 03/30/2014   Agree with note by Rosaria Ferries PA-C.  Had TEE today. Nl w/o thrombus or smoke. Nl LV fxn. On Eliquis. Only received 2 doses, needs 4 to be adequately anticoagulated. Plan to DCCV tomorrow then hopefully D/C home after that.  Lorretta Harp, M.D., [place], [facility], [facility] [place] [facility] [address] [place], [place]  [address]  [phone] 03/30/2014 11:19 AM

## 2014-03-30 NOTE — Anesthesia Preprocedure Evaluation (Deleted)
Anesthesia Evaluation  Patient identified by MRN, date of birth, ID band Patient awake    Reviewed: Allergy & Precautions, H&P , NPO status , Patient's Chart, lab work & pertinent test results  Airway       Dental   Pulmonary          Cardiovascular hypertension, + dysrhythmias Atrial Fibrillation     Neuro/Psych  Headaches, Anxiety  Neuromuscular disease    GI/Hepatic   Endo/Other  diabetesHypothyroidism   Renal/GU      Musculoskeletal   Abdominal   Peds  Hematology  (+) anemia ,   Anesthesia Other Findings   Reproductive/Obstetrics                           Anesthesia Physical Anesthesia Plan  ASA: III  Anesthesia Plan: MAC   Post-op Pain Management:    Induction: Intravenous  Airway Management Planned: Simple Face Mask  Additional Equipment:   Intra-op Plan:   Post-operative Plan:   Informed Consent: I have reviewed the patients History and Physical, chart, labs and discussed the procedure including the risks, benefits and alternatives for the proposed anesthesia with the patient or authorized representative who has indicated his/her understanding and acceptance.     Plan Discussed with:   Anesthesia Plan Comments:         Anesthesia Quick Evaluation

## 2014-03-30 NOTE — Progress Notes (Signed)
  Echocardiogram Echocardiogram Transesophageal has been performed.  [name] 03/30/2014, 11:30 AM

## 2014-03-30 NOTE — CV Procedure (Signed)
   Transesophageal Echocardiogram: Indication:  Afib  Sedation by Anesthesia including propofol   Procedure:  The patient was moderately sedated with the above doses of versed and fentanyl.  Using digital technique an omniplane probe was advanced into the distal esophagus without incident. Transgastric imaging revealed normal LV function with no RWMA;s and no mural apical thrombus.   .  Estimated ejection fraction was 55%.  Right sided cardiac chambers were normal with no evidence of pulmonary hypertension.  The pulmonary and tricuspid valves were structurally normal.  There was mild  TR The mitral valve was structurally normal with mild  mitral regurgitation.    The aortic valve was trileaflet with no AS/AR The aortic root was normal.    Imaging of the septum showed no ASD or VSD Bubble study was negative for shunt 2D and color flow confirmed no PFO  The LAE was well visualized in orthogonal views.  There was no spontaneous contrast and no thrombus.  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

## 2014-03-30 NOTE — Progress Notes (Signed)
UR Completed Finesse Fielder Graves-Bigelow, RN,BSN [phone]  

## 2014-03-31 ENCOUNTER — Inpatient Hospital Stay (HOSPITAL_COMMUNITY): Payer: Medicare Other | Admitting: Anesthesiology

## 2014-03-31 ENCOUNTER — Encounter (HOSPITAL_COMMUNITY): Payer: Self-pay | Admitting: Cardiovascular Disease

## 2014-03-31 ENCOUNTER — Encounter (HOSPITAL_COMMUNITY)
Admission: EM | Disposition: A | Discharge status: Home / Self Care | Payer: Medicare Other | Source: Home / Self Care | Attending: Cardiovascular Disease

## 2014-03-31 ENCOUNTER — Encounter (HOSPITAL_COMMUNITY): Payer: Medicare Other | Admitting: Anesthesiology

## 2014-03-31 ENCOUNTER — Other Ambulatory Visit: Payer: Medicare Other | Admitting: Physician Assistant

## 2014-03-31 DIAGNOSIS — I4891 Unspecified atrial fibrillation: Secondary | ICD-10-CM

## 2014-03-31 HISTORY — PX: CARDIOVERSION: SHX1299

## 2014-03-31 LAB — GLUCOSE, CAPILLARY
GLUCOSE-CAPILLARY: 138 mg/dL — AB (ref 70–99)
Glucose-Capillary: 147 mg/dL — ABNORMAL HIGH (ref 70–99)

## 2014-03-31 LAB — CBC
HCT: 38.7 % (ref 36.0–46.0)
Hemoglobin: 12.8 g/dL (ref 12.0–15.0)
MCH: 29.2 pg (ref 26.0–34.0)
MCHC: 33.1 g/dL (ref 30.0–36.0)
MCV: 88.4 fL (ref 78.0–100.0)
PLATELETS: 186 10*3/uL (ref 150–400)
RBC: 4.38 MIL/uL (ref 3.87–5.11)
RDW: 13.1 % (ref 11.5–15.5)
WBC: 5.9 10*3/uL (ref 4.0–10.5)

## 2014-03-31 LAB — BASIC METABOLIC PANEL
BUN: 17 mg/dL (ref 6–23)
CALCIUM: 9.4 mg/dL (ref 8.4–10.5)
CO2: 27 mEq/L (ref 19–32)
Chloride: 103 mEq/L (ref 96–112)
Creatinine, Ser: 0.8 mg/dL (ref 0.50–1.10)
GFR, EST AFRICAN AMERICAN: 78 mL/min — AB (ref >90)
GFR, EST NON AFRICAN AMERICAN: 67 mL/min — AB (ref >90)
Glucose, Bld: 135 mg/dL — ABNORMAL HIGH (ref 70–99)
Potassium: 4.2 mEq/L (ref 3.7–5.3)
SODIUM: 142 meq/L (ref 137–147)

## 2014-03-31 SURGERY — CARDIOVERSION
Anesthesia: General

## 2014-03-31 MED ORDER — LIDOCAINE HCL (CARDIAC) 20 MG/ML IV SOLN
INTRAVENOUS | Status: DC | PRN
  Administered 2014-03-31: 20 mg via INTRAVENOUS

## 2014-03-31 MED ORDER — DILTIAZEM HCL ER COATED BEADS 240 MG PO CP24: 240.0000 mg | ORAL_CAPSULE | Freq: Every day | ORAL | Status: DC

## 2014-03-31 MED ORDER — HYDROCHLOROTHIAZIDE 12.5 MG PO CAPS: 12.5000 mg | ORAL_CAPSULE | Freq: Every day | ORAL | Status: DC

## 2014-03-31 MED ORDER — PROPOFOL 10 MG/ML IV BOLUS
INTRAVENOUS | Status: DC | PRN
  Administered 2014-03-31: 60 mg via INTRAVENOUS

## 2014-03-31 MED ORDER — APIXABAN 5 MG PO TABS: 5.0000 mg | ORAL_TABLET | Freq: Two times a day (BID) | ORAL | Status: DC

## 2014-03-31 MED ORDER — LOSARTAN POTASSIUM 50 MG PO TABS: 50.0000 mg | ORAL_TABLET | Freq: Every day | ORAL | Status: DC

## 2014-03-31 MED ORDER — SODIUM CHLORIDE 0.9 % IV SOLN
INTRAVENOUS | Status: DC
  Administered 2014-03-31: 10:00:00 via INTRAVENOUS

## 2014-03-31 MED ORDER — DILTIAZEM HCL 30 MG PO TABS
30.0000 mg | ORAL_TABLET | Freq: Four times a day (QID) | ORAL | Status: DC
  Administered 2014-03-31: 30 mg via ORAL
  Filled 2014-03-31 (×5): qty 1

## 2014-03-31 NOTE — Transfer of Care (Signed)
Immediate Anesthesia Transfer of Care Note  Patient: [name]  Procedure(s) Performed: Procedure(s): CARDIOVERSION  (BEDSIDE)  (N/A)  Patient Location: Nursing Unit  Anesthesia Type:General  Level of Consciousness: awake, alert  and oriented  Airway & Oxygen Therapy: Patient Spontanous Breathing and Patient connected to nasal cannula oxygen  Post-op Assessment: Report given to PACU RN, Post -op Vital signs reviewed and stable and Patient moving all extremities  Post vital signs: Reviewed and stable  Complications: No apparent anesthesia complications

## 2014-03-31 NOTE — Discharge Summary (Signed)
CARDIOLOGY DISCHARGE SUMMARY   Patient ID: [name] MRN: [id number] DOB/AGE: [id number] 78 y.o.  Admit date: 03/28/2014 Discharge date: 04/02/2014  PCP: Dwan Bolt, MD Primary Cardiologist: [name]  Primary Discharge Diagnosis:  Atrial fibrillation with RVR Secondary Discharge Diagnosis:    Diabetes mellitus without complication   Hypertension   Anticoagulated by anticoagulation treatment - Eliquis   OSA on CPAP  Procedures: Transesophageal echocardiogram, direct current cardioversion  Hospital Course: [name] is a 80 y.o. female with no history of CAD or arrhythmia. She had a little fluttering and noticed her heart racing and irregular. [facility] and was found to be in atrial fibrillation with rapid ventricular response. She was sent to the emergency room and admitted the patient and treatment.  She remained in rapid atrial fibrillation and her heart rate was difficult to control, despite a Cardizem drip. The duration of the atrial fibrillation was unknown. She was continued on her CPAP during her hospital stay. Blood sugars were managed with sliding scale insulin. She was noted to have a heart murmur but it was not a pathologic murmur.  Since her heart rate was difficult to control on IV or oral medications, a TEE cardioversion was indicated. She was initially placed on heparin for anticoagulations but was started on Eliquis. She had a TEE on 05/18, results are below. Once she had 4 doses of Eliquis, a cardioversion was scheduled. This was performed on 05/19. She was successfully cardioverted with one shock into sinus rhythm. She recovered from the anesthesia well.  On 05/19, [name] was seen by Dr. Gwenlyn Found. All data were reviewed. No further inpatient workup is indicated. Her medications have been adjusted for better blood pressure and heart rate control. [name] is ambulating after her cardioversion without chest pain  or shortness of breath and maintaining sinus rhythm. She is considered stable for discharge, to follow up as an outpatient.  Labs:   Lab Results  Component Value Date   WBC 5.9 03/31/2014   HGB 12.8 03/31/2014   HCT 38.7 03/31/2014   MCV 88.4 03/31/2014   PLT 186 03/31/2014    Recent Labs Lab 03/28/14 2150  03/31/14 0631  NA 142  < > 142  K 3.5*  < > 4.2  CL 104  < > 103  CO2 24  < > 27  BUN 25*  < > 17  CREATININE 0.78  < > 0.80  CALCIUM 9.0  < > 9.4  PROT 6.2  --   --   BILITOT 0.4  --   --   ALKPHOS 70  --   --   ALT 15  --   --   AST 15  --   --   GLUCOSE 127*  < > 135*  < > = values in this interval not displayed. No results found for this basename: CKTOTAL, CKMB, CKMBINDEX, TROPONINI,  in the last 72 hours No results found for this basename: INR,  in the last 72 hours   Radiology: Dg Chest Port 1 View 03/28/2014   CLINICAL DATA:  Shortness of breath  EXAM: PORTABLE CHEST - 1 VIEW  COMPARISON:  September 25, 2013  FINDINGS: The mediastinal contour is normal. The heart size is enlarged. The aorta is tortuous. There is no focal infiltrate, pulmonary edema, or pleural effusion. There is hiatal hernia. Surgical clips are identified in the right axilla unchanged. No acute abnormalities identified within the visualized bones.  IMPRESSION: No active cardiopulmonary disease.   Electronically Signed   By: Abelardo Diesel M.D.   On: 03/28/2014 18:34   EKG: 31-Mar-2014 13:30:17   Sinus rhythm with marked sinus arrhythmia Vent. rate 84 BPM PR interval 160 ms QRS duration 90 ms QT/QTc 372/439 ms P-R-T axes 74 64 65  Echo: Transesophageal echocardiogram, 03/30/2014 Procedure: The patient was moderately sedated with the above doses of versed and fentanyl. Using digital technique an omniplane probe was advanced into the distal esophagus without incident. Transgastric imaging revealed normal LV function with no RWMA;s and no mural apical thrombus. . Estimated ejection fraction was 55%. Right  sided cardiac chambers were normal with no evidence of pulmonary hypertension.  The pulmonary and tricuspid valves were structurally normal. There was mild TR  The mitral valve was structurally normal with mild mitral regurgitation.  The aortic valve was trileaflet with no AS/AR  The aortic root was normal.  Imaging of the septum showed no ASD or VSD Bubble study was negative for shunt 2D and color flow confirmed no PFO  The LAE was well visualized in orthogonal views. There was no spontaneous contrast and no thrombus. 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 Penicillins Itching and Rash     Medication List    STOP taking these medications       celecoxib 200 MG capsule  Commonly known as:  CELEBREX     OVER THE COUNTER MEDICATION      TAKE these medications       apixaban 5 MG Tabs tablet  Commonly known as:  ELIQUIS  Take 1 tablet (5 mg total) by mouth 2 (two) times daily.     B-12 PO  Take 1 tablet by mouth daily.     bimatoprost 0.03 % ophthalmic solution  Commonly known as:  LUMIGAN  Place 1 drop into both eyes at bedtime.     CITRACAL +D3 PO  Take 1 tablet by mouth every evening.     diltiazem 240 MG 24 hr capsule  Commonly known as:  CARDIZEM CD  Take 1 capsule (240 mg total) by mouth daily.     lansoprazole 30 MG capsule  Commonly known as:  PREVACID  Take 30 mg by mouth every morning.     levothyroxine 137 MCG tablet    Commonly known as:  SYNTHROID, LEVOTHROID  Take 137 mcg by mouth daily before breakfast.     LORazepam 1 MG tablet  Commonly known as:  ATIVAN  Take 1.5 mg by mouth at bedtime.     losartan-hydrochlorothiazide 50-12.5 MG per tablet  Commonly known as:  HYZAAR  Take 1 tablet by mouth daily.     magnesium oxide 400 MG tablet  Commonly known as:  MAG-OX  Take 400 mg by mouth 2 (two) times daily.     metFORMIN 500 MG (MOD) 24 hr tablet  Commonly known as:  GLUMETZA  Take 500-750 mg by mouth 2 (two) times daily with a meal. 750mg  in am, 500mg  in pm     multivitamin-lutein Caps capsule  Take 1 capsule by mouth daily.     rosuvastatin 20 MG tablet  Commonly known as:  CRESTOR  Take 10 mg by mouth at bedtime.     sertraline 50 MG tablet  Commonly known as:  ZOLOFT  Take 50 mg by mouth daily.     TANDEM PLUS 162-115.2-1 MG Caps  Take 1 tablet by mouth daily as needed (only as needed).        Discharge Instructions   Diet - low sodium heart healthy    Complete by:  As directed      Diet Carb Modified    Complete by:  As directed      Increase activity slowly    Complete by:  As directed           Follow-up Information   Follow up with Sherren Mocha, MD. (The office will call.)    Specialty:  Cardiology   Contact information:   [address] [place] [address] [phone]       Follow up with [facility]) On 04/01/2014. (Come at 10:30 am to be fitted for event monitor.)    Specialty:  Cardiology   Contact information:   [address] [place] [address] [phone]      BRING ALL MEDICATIONS WITH YOU TO FOLLOW UP APPOINTMENTS  Time spent with patient to include physician time: 50 min Signed: Lonn Georgia, PA-C 04/02/2014, 9:33 AM Co-Sign MD

## 2014-03-31 NOTE — Procedures (Signed)
Electrical Cardioversion Procedure Note ADRIANNAH STEINKAMP [id number] [birth year]  Procedure: Electrical Cardioversion Indications:  Atrial Fibrillation  Procedure Details Consent: Risks of procedure as well as the alternatives and risks of each were explained to the (patient/caregiver).  Consent for procedure obtained. Time Out: Verified patient identification, verified procedure, site/side was marked, verified correct patient position, special equipment/implants available, medications/allergies/relevent history reviewed, required imaging and test results available.  Performed  Patient placed on cardiac monitor, pulse oximetry, supplemental oxygen as necessary.  Sedation given: Patient sedated by anesthesia with diprovan 60 mg IV. Pacer pads placed anterior and posterior chest.  Cardioverted 1 time(s).  Cardioverted at 120J.  Evaluation Findings: Post procedure EKG shows: NSR Complications: None Patient did tolerate procedure well.   Lelon Perla 03/31/2014, 1:30 PM

## 2014-03-31 NOTE — Anesthesia Postprocedure Evaluation (Signed)
  Anesthesia Post-op Note  Patient: [name]  Procedure(s) Performed: Procedure(s): CARDIOVERSION  (BEDSIDE)  (N/A)  Patient Location: PACU and Nursing Unit  Anesthesia Type:MAC  Level of Consciousness: awake  Airway and Oxygen Therapy: Patient Spontanous Breathing  Post-op Pain: mild  Post-op Assessment: Post-op Vital signs reviewed  Post-op Vital Signs: Reviewed  Last Vitals:  Filed Vitals:   03/31/14 1222  BP: 146/85  Pulse:   Temp:   Resp:     Complications: No apparent anesthesia complications

## 2014-03-31 NOTE — Anesthesia Preprocedure Evaluation (Signed)
Anesthesia Evaluation  Patient identified by MRN, date of birth, ID band Patient awake    Reviewed: Allergy & Precautions, H&P , NPO status , Patient's Chart, lab work & pertinent test results  Airway Mallampati: II TM Distance: >3 FB Neck ROM: Full    Dental  (+) Teeth Intact   Pulmonary          Cardiovascular hypertension, Pt. on medications     Neuro/Psych    GI/Hepatic   Endo/Other  diabetes, Type 2, Oral Hypoglycemic Agents  Renal/GU      Musculoskeletal   Abdominal   Peds  Hematology   Anesthesia Other Findings   Reproductive/Obstetrics                           Anesthesia Physical Anesthesia Plan  ASA: III  Anesthesia Plan: General   Post-op Pain Management:    Induction:   Airway Management Planned: Mask  Additional Equipment: None  Intra-op Plan:   Post-operative Plan: Extubation in OR  Informed Consent: I have reviewed the patients History and Physical, chart, labs and discussed the procedure including the risks, benefits and alternatives for the proposed anesthesia with the patient or authorized representative who has indicated his/her understanding and acceptance.   Dental advisory given  Plan Discussed with: CRNA, Anesthesiologist and Surgeon  Anesthesia Plan Comments:         Anesthesia Quick Evaluation

## 2014-03-31 NOTE — Progress Notes (Signed)
Patient Name: [name] Date of Encounter: 03/31/2014  Principal Problem:   Atrial fibrillation with RVR Active Problems:   Diabetes mellitus without complication   Hypertension   Anticoagulated by anticoagulation treatment - Eliquis    Patient Profile: 80 yo female w/ no CAD/Arrhythmia hx, hx HTN, SEM, DM, OSA on CPAP, CA. Admitted 05/16 w/ palpitations, afib, RVR. TEE 05/18 w/out LAA thrombus, for DCCV today since has now had 4 doses Eliquis.  SUBJECTIVE: No chest pain, unaware of rapid/irregular HR. No SOB  OBJECTIVE Filed Vitals:   03/30/14 1030 03/30/14 1344 03/30/14 2100 03/31/14 0500  BP:  144/86 134/81 115/86  Pulse: 84 111 115 85  Temp:  97.9 F (36.6 C) 98.3 F (36.8 C) 98.4 F (36.9 C)  TempSrc:  Oral    Resp: 17 18 18 18   Height:      Weight:    196 lb 12.8 oz (89.268 kg)  SpO2: 96% 97% 95% 94%   No intake or output data in the 24 hours ending 03/31/14 0825 Filed Weights   03/28/14 2052 03/30/14 0500 03/31/14 0500  Weight: 198 lb 8 oz (90.039 kg) 197 lb (89.359 kg) 196 lb 12.8 oz (89.268 kg)    PHYSICAL EXAM General: Well developed, well nourished, female in no acute distress. Head: Normocephalic, atraumatic.  Neck: Supple without bruits, JVD not elevated. Lungs:  Resp regular and unlabored, CTA. Heart: Rapid and irregular, S1, S2, no S3, S4, or murmur; no rub. Abdomen: Soft, non-tender, non-distended, BS + x 4.  Extremities: No clubbing, cyanosis, no edema.  Neuro: Alert and oriented X 3. Moves all extremities spontaneously. Psych: Normal affect.  LABS: CBC: Recent Labs  03/28/14 1750 03/28/14 2150  03/30/14 0520 03/31/14 0631  WBC 4.8 4.4  < > 5.8 5.9  NEUTROABS 3.1 2.2  --   --   --   HGB 12.1 11.4*  < > 12.0 12.8  HCT 36.6 34.1*  < > 36.0 38.7  MCV 89.3 87.7  < > 88.5 88.4  PLT 162 158  < > 172 186  < > = values in this interval not displayed. INR: Recent Labs  03/28/14 2150  INR 9.52   Basic Metabolic Panel: Recent  Labs  03/28/14 1750 03/28/14 2150  03/30/14 0520 03/31/14 0631  NA 143 142  < > 140 142  K 3.8 3.5*  < > 3.7 4.2  CL 104 104  < > 103 103  CO2 23 24  < > 25 27  GLUCOSE 125* 127*  < > 134* 135*  BUN 28* 25*  < > 18 17  CREATININE 0.87 0.78  < > 0.77 0.80  CALCIUM 9.5 9.0  < > 8.9 9.4  MG 1.8 1.8  --   --   --   < > = values in this interval not displayed. Liver Function Tests: Recent Labs  03/28/14 2150  AST 15  ALT 15  ALKPHOS 70  BILITOT 0.4  PROT 6.2  ALBUMIN 3.3*   Cardiac Enzymes: Recent Labs  03/28/14 2150 03/29/14 0548 03/29/14 0953  TROPONINI <0.30 <0.30 <0.30   Thyroid Function Tests: Recent Labs  03/28/14 1750  TSH 1.130   TELE: afib, RVR  Current Medications:  . apixaban  5 mg Oral BID  . atorvastatin  40 mg Oral q1800  . diltiazem  240 mg Oral Daily  . losartan  50 mg Oral Daily   And  . hydrochlorothiazide  12.5 mg Oral Daily  .  latanoprost  1 drop Both Eyes QHS  . levothyroxine  137 mcg Oral QAC breakfast  . LORazepam  1.5 mg Oral QHS  . magnesium oxide  400 mg Oral BID  . metFORMIN  500 mg Oral Q supper  . metFORMIN  750 mg Oral Q breakfast  . multivitamin-lutein  1 capsule Oral Daily  . pantoprazole  40 mg Oral Daily  . potassium chloride  20 mEq Oral Daily  . sertraline  50 mg Oral Daily   . diltiazem (CARDIZEM) infusion Stopped (03/29/14 1230)    ASSESSMENT AND PLAN: Principal Problem:   Atrial fibrillation with RVR - TEE 05/18, for DCCV today. MD advise on giving Cardizem CD 240 prior to DCCV  Active Problems:   Diabetes mellitus without complication - DM diet, hold am metformin since NPO    Hypertension - BP good to low on current rx    Anticoagulated by anticoagulation treatment - Eliquis - no S&S bleeding   Plan - possible d/c today if does well w/ DCCV  Signed, Lonn Georgia , PA-C 8:25 AM 03/31/2014  Agree with note by Rosaria Ferries PA-C  Pt has had 4 doses of Pradaxa and is scheduled for bedside DCCV later  today, She prefers to have her follow up with [name].  Lorretta Harp, M.D., [place], [facility], [place] [address] [place], [place]  [address]  [phone] 03/31/2014 9:19 AM

## 2014-04-01 ENCOUNTER — Encounter: Payer: Self-pay | Admitting: *Deleted

## 2014-04-01 ENCOUNTER — Encounter (INDEPENDENT_AMBULATORY_CARE_PROVIDER_SITE_OTHER): Payer: Medicare Other

## 2014-04-01 DIAGNOSIS — I4891 Unspecified atrial fibrillation: Secondary | ICD-10-CM

## 2014-04-01 NOTE — Progress Notes (Signed)
Patient ID: [name], female   DOB: [birth year], 80 y.o.   MRN: [id number] E-Cardio verite 30 day cardiac event  Monitor applied to patient. First monitor [id number] malfunctioned- mailed back to [facility] 04/01/14. Replaced with second monitor # [id number]. Evelena Asa at [facility] called with corrected information.

## 2014-04-02 ENCOUNTER — Telehealth: Payer: Self-pay | Admitting: *Deleted

## 2014-04-02 NOTE — Telephone Encounter (Signed)
eliquis approved through [place] through 04/01/2015

## 2014-04-03 ENCOUNTER — Encounter (HOSPITAL_COMMUNITY): Payer: Self-pay | Admitting: Cardiology

## 2014-04-08 ENCOUNTER — Encounter: Payer: Self-pay | Admitting: *Deleted

## 2014-04-09 ENCOUNTER — Ambulatory Visit (INDEPENDENT_AMBULATORY_CARE_PROVIDER_SITE_OTHER): Payer: Medicare Other | Admitting: Physician Assistant

## 2014-04-09 ENCOUNTER — Encounter: Payer: Self-pay | Admitting: Physician Assistant

## 2014-04-09 VITALS — BP 111/64 | HR 117 | Ht 66.5 in | Wt 198.0 lb

## 2014-04-09 DIAGNOSIS — I4891 Unspecified atrial fibrillation: Secondary | ICD-10-CM

## 2014-04-09 DIAGNOSIS — E890 Postprocedural hypothyroidism: Secondary | ICD-10-CM

## 2014-04-09 DIAGNOSIS — E7800 Pure hypercholesterolemia, unspecified: Secondary | ICD-10-CM

## 2014-04-09 DIAGNOSIS — I10 Essential (primary) hypertension: Secondary | ICD-10-CM

## 2014-04-09 HISTORY — DX: Postprocedural hypothyroidism: E89.0

## 2014-04-09 MED ORDER — AMIODARONE HCL 200 MG PO TABS: ORAL_TABLET | ORAL | Status: DC

## 2014-04-09 NOTE — Patient Instructions (Addendum)
START AMIODARONE 400 MG TWICE DAILY X 2 WEEKS THEN DECREASE TO 200 MG TWICE DAILY  PLEASE FOLLOW UP WITH SCOTT WEAVER, PAC ON 04/23/14 @ 3 PM SAME DAY [name] IS IN THE OFFICE  WE WILL HAVE YOU STOP YOUR MONITOR AS ON 04/15/14

## 2014-04-09 NOTE — Progress Notes (Addendum)
Cardiology Office Note   Date:  04/09/2014   ID:  [name], DOB Feb 13, 1931, MRN [id number]  PCP:  Dwan Bolt, MD  Cardiologist:  Dr. Sherren Mocha      History of Present Illness: [name] is a 80 y.o. female with a remote history of heart murmur previously followed by Dr. Glade Lloyd, DM, HTN. She was recently admitted to 5/16-5/21 with atrial fibrillation with RVR. Heart rate was difficult to control on IV diltiazem. She was placed on Eliquis for anticoagulation.  She underwent TEE guided cardioversion with restoration of NSR.    She returns for follow up.  She feels fatigued and has noted her HR is high again.   The patient denies chest pain, shortness of breath, syncope, orthopnea, PND or significant pedal edema.    Studies:  - TEE (03/2014): No LAA clot, moderate LAE, core triatriatum type structure in RA with no stenosis, normal EF 55-60%, mild MR   Recent Labs: 03/28/2014: ALT 15; TSH 1.130  03/31/2014: Creatinine 0.80; Hemoglobin 12.8; Potassium 4.2   Wt Readings from Last 3 Encounters:  04/09/14 198 lb (89.812 kg)  03/31/14 196 lb 12.8 oz (89.268 kg)  03/31/14 196 lb 12.8 oz (89.268 kg)     Past Medical History  Diagnosis Date  . Hypothyroidism, postsurgical   . Hypertension   . Glaucoma   . Cervical cancer   . Breast cancer     s/p right breast lumpectomy  . Thyroid cancer     s/p thyroidectomy  . Atrial fibrillation   . Heart murmur     "slight" (03/31/2014)  . Type II diabetes mellitus   . OSA on CPAP   . H/O hiatal hernia   . GERD (gastroesophageal reflux disease)   . Migraine     "last one was years ago" (03/31/2014)  . DJD (degenerative joint disease) of knee   . Arthritis     "all my joints" (03/31/2014)    Current Outpatient Prescriptions  Medication Sig Dispense Refill  . apixaban (ELIQUIS) 5 MG TABS tablet Take 1 tablet (5 mg total) by mouth 2 (two) times daily.  60 tablet  11  . bimatoprost (LUMIGAN) 0.03 % ophthalmic  solution Place 1 drop into both eyes at bedtime.      . Calcium-Phosphorus-Vitamin D (CITRACAL +D3 PO) Take 1 tablet by mouth every evening.      . Cyanocobalamin (B-12 PO) Take 1 tablet by mouth daily.      Marland Kitchen diltiazem (CARDIZEM CD) 240 MG 24 hr capsule Take 1 capsule (240 mg total) by mouth daily.  30 capsule  11  . FeFum-FePo-FA-B Cmp-C-Zn-Mn-Cu (TANDEM PLUS) 162-115.2-1 MG CAPS Take 1 tablet by mouth daily as needed (only as needed).      . lansoprazole (PREVACID) 30 MG capsule Take 30 mg by mouth every morning.      Marland Kitchen levothyroxine (SYNTHROID, LEVOTHROID) 137 MCG tablet Take 137 mcg by mouth daily before breakfast.      . LORazepam (ATIVAN) 1 MG tablet Take 1.5 mg by mouth at bedtime.      Marland Kitchen losartan-hydrochlorothiazide (HYZAAR) 50-12.5 MG per tablet Take 1 tablet by mouth daily.      . magnesium oxide (MAG-OX) 400 MG tablet Take 400 mg by mouth 2 (two) times daily.      . metFORMIN (GLUMETZA) 500 MG (MOD) 24 hr tablet Take 500-750 mg by mouth 2 (two) times daily with a meal. 750mg  in am, 500mg  in pm      .  multivitamin-lutein (OCUVITE-LUTEIN) CAPS capsule Take 1 capsule by mouth daily.      . rosuvastatin (CRESTOR) 20 MG tablet Take 10 mg by mouth at bedtime.      . sertraline (ZOLOFT) 50 MG tablet Take 50 mg by mouth daily.       No current facility-administered medications for this visit.    Allergies:   Penicillins   Social History:  The patient  reports that she has never smoked. She has never used smokeless tobacco. She reports that she does not drink alcohol or use illicit drugs.   Family History:  The patient's family history includes Diabetes in her brother, brother, brother, brother, brother, brother, brother, sister, sister, and sister; Heart attack in her brother; Heart disease in her brother, brother, brother, brother, brother, brother, and sister; Pancreatic cancer in her sister; Prostate cancer in her father.   ROS:  Please see the history of present illness.   She denies  any bleeding problems.   All other systems reviewed and negative.   PHYSICAL EXAM: VS:  BP 111/64  Pulse 117  Ht 5' 6.5" (1.689 m)  Wt 198 lb (89.812 kg)  BMI 31.48 kg/m2 Well nourished, well developed, in no acute distress HEENT: normal Neck: no JVD Cardiac:  normal S1, S2; irregularly irregular rhythm; no murmur Lungs:  clear to auscultation bilaterally, no wheezing, rhonchi or rales Abd: soft, nontender, no hepatomegaly Ext: trace bilateral LE edema Skin: warm and dry Neuro:  CNs 2-12 intact, no focal abnormalities noted  EKG:  Atrial fibrillation, HR 117     ASSESSMENT AND PLAN:  1. Atrial fibrillation with RVR: She is back in atrial fibrillation with RVR. She is somewhat symptomatic with fatigue. She needs antiarrhythmic therapy. Given her advanced age and history of diabetes, class IC agents are likely contraindicated.  I reviewed this with Dr. Percival Spanish today (DOD). We will place the patient on amiodarone 400 mg twice a day for 2 weeks. She will then reduce to 200 mg twice a day.  She will remain on Eliquis. Recent TSH and LFTs were okay. We will bring her back in one to 2 weeks with an eye towards redo cardioversion after adequate load of amiodarone.  If she maintains NSR with amiodarone, she will need formal PFTs.  She was placed on an event monitor after d/c.  The available strips indicate she was in NSR for several days, then developed recurrent AFib and has remained in AFib since.  I will d/c her monitor after a total of 2 weeks. 2. HYPERTENSION: Controlled. 3. HYPERCHOLESTEROLEMIA: Continue statin. 4. Post-surgical hypothyroidism: We will need to keep a close eye on her TSH with the addition of amiodarone. 5. Disposition: Followup with [name] or me in one to 2 weeks.   Signed, Versie Starks, MHS 04/09/2014 12:03 PM    [facility] [place], [place], [place]  [address] Phone: [phone]; Fax: [phone]

## 2014-04-16 ENCOUNTER — Telehealth: Payer: Self-pay | Admitting: Physician Assistant

## 2014-04-16 NOTE — Telephone Encounter (Signed)
I called and s/w pt about her BP yesterday. Pt states BP @ 2:30 pm was 148/102 HR 110. Pt states she took extra losartan 50/12.5 last night and felt "somewhat better". BP today at 7 am 148/91 HR 87, pt states took 1/2 tab of 50/12.5 mg losartan tonight..I advised pt not to take anymore until I d/w PA tomorrow since everyone has left for the evening. Pt said ok and will wait for my call tomorrow.

## 2014-04-16 NOTE — Telephone Encounter (Signed)
New message     Pt is wearing a monitor.  Yesterday her blood pressure went up---the bottom number was 102.  Did we get this information?  Pt took an extra blood pressure pill.  Want to talk to a nurse

## 2014-04-17 ENCOUNTER — Telehealth: Payer: Self-pay | Admitting: *Deleted

## 2014-04-17 DIAGNOSIS — I10 Essential (primary) hypertension: Secondary | ICD-10-CM

## 2014-04-17 MED ORDER — LOSARTAN POTASSIUM-HCTZ 100-25 MG PO TABS: 1.0000 | ORAL_TABLET | Freq: Every day | ORAL | Status: DC

## 2014-04-17 NOTE — Telephone Encounter (Signed)
pt notified per Brynda Rim. PA to increase hyzaar to 100/25 mg daily per my conversation w/pt last night  about elevated BP's. BMET 6/11 when she see's [place]. Rx sent in today.

## 2014-04-23 ENCOUNTER — Encounter: Payer: Self-pay | Admitting: Physician Assistant

## 2014-04-23 ENCOUNTER — Other Ambulatory Visit (INDEPENDENT_AMBULATORY_CARE_PROVIDER_SITE_OTHER): Payer: Medicare Other

## 2014-04-23 ENCOUNTER — Encounter: Payer: Self-pay | Admitting: *Deleted

## 2014-04-23 ENCOUNTER — Ambulatory Visit (INDEPENDENT_AMBULATORY_CARE_PROVIDER_SITE_OTHER): Payer: Medicare Other | Admitting: Physician Assistant

## 2014-04-23 VITALS — BP 120/65 | HR 108 | Ht 66.5 in | Wt 195.0 lb

## 2014-04-23 DIAGNOSIS — I10 Essential (primary) hypertension: Secondary | ICD-10-CM

## 2014-04-23 DIAGNOSIS — I4891 Unspecified atrial fibrillation: Secondary | ICD-10-CM

## 2014-04-23 DIAGNOSIS — E7800 Pure hypercholesterolemia, unspecified: Secondary | ICD-10-CM

## 2014-04-23 DIAGNOSIS — E890 Postprocedural hypothyroidism: Secondary | ICD-10-CM

## 2014-04-23 NOTE — H&P (Signed)
History and Physical    Date:  04/23/2014   ID:  [name], DOB Mar 30, 1931, MRN [id number]  PCP:  Irven Shelling, MD  Cardiologist:  Dr. Sherren Mocha      History of Present Illness: [name] is a 80 y.o. female with a remote history of heart murmur previously followed by Dr. Glade Lloyd, DM, HTN. She was recently admitted 03/2014 with atrial fibrillation with RVR. Heart rate was difficult to control on IV diltiazem. She was placed on Eliquis for anticoagulation.  She underwent TEE guided cardioversion with restoration of NSR.    I saw her in f/u 5/28.  She was back in AFib.  I placed her on Amiodarone and brought her back today for follow up.  We plan to try DCCV again once she has been adequately loaded with Amiodarone.  She is feeling better.  However, she is still fatigued.  Otherwise, denies chest pain, dyspnea, syncope, orthopnea, PND, edema.     Studies:  - TEE (03/2014): No LAA clot, moderate LAE, core triatriatum type structure in RA with no stenosis, normal EF 55-60%, mild MR   Recent Labs: 03/28/2014: ALT 15; TSH 1.130  03/31/2014: Creatinine 0.80; Hemoglobin 12.8; Potassium 4.2   Wt Readings from Last 3 Encounters:  04/23/14 195 lb (88.451 kg)  04/09/14 198 lb (89.812 kg)  03/31/14 196 lb 12.8 oz (89.268 kg)     Past Medical History  Diagnosis Date  . Hypothyroidism, postsurgical   . Hypertension   . Glaucoma   . Cervical cancer   . Breast cancer     s/p right breast lumpectomy  . Thyroid cancer     s/p thyroidectomy  . Atrial fibrillation   . Heart murmur     "slight" (03/31/2014)  . Type II diabetes mellitus   . OSA on CPAP   . H/O hiatal hernia   . GERD (gastroesophageal reflux disease)   . Migraine     "last one was years ago" (03/31/2014)  . DJD (degenerative joint disease) of knee   . Arthritis     "all my joints" (03/31/2014)    Current Outpatient Prescriptions  Medication Sig Dispense Refill  . amiodarone (PACERONE) 200 MG  tablet Take 400 mg twice daily x 2 weeks; then decrease to 200 mg twice daily  90 tablet  11  . apixaban (ELIQUIS) 5 MG TABS tablet Take 1 tablet (5 mg total) by mouth 2 (two) times daily.  60 tablet  11  . bimatoprost (LUMIGAN) 0.01 % SOLN Apply to eye. Eye drop      . bimatoprost (LUMIGAN) 0.03 % ophthalmic solution Place 1 drop into both eyes at bedtime.      . Calcium-Phosphorus-Vitamin D (CITRACAL +D3 PO) Take 1 tablet by mouth every evening.      . Cyanocobalamin (B-12 PO) Take 1 tablet by mouth daily.      Marland Kitchen diltiazem (CARDIZEM CD) 240 MG 24 hr capsule Take 240 mg by mouth daily. (CARTIA)      . FeFum-FePo-FA-B Cmp-C-Zn-Mn-Cu (TANDEM PLUS) 162-115.2-1 MG CAPS Take 1 tablet by mouth daily as needed (only as needed).      . lansoprazole (PREVACID) 30 MG capsule Take 30 mg by mouth every morning.      Marland Kitchen levothyroxine (SYNTHROID, LEVOTHROID) 137 MCG tablet Take 137 mcg by mouth daily before breakfast.      . LORazepam (ATIVAN) 1 MG tablet Take 1.5 mg by mouth at bedtime.      Marland Kitchen losartan-hydrochlorothiazide (HYZAAR)  100-25 MG per tablet Take 1 tablet by mouth daily.  30 tablet  11  . magnesium oxide (MAG-OX) 400 MG tablet Take 400 mg by mouth 2 (two) times daily.      . metFORMIN (GLUMETZA) 500 MG (MOD) 24 hr tablet Take 500-750 mg by mouth 2 (two) times daily with a meal. 750mg  in am, 500mg  in pm      . multivitamin-lutein (OCUVITE-LUTEIN) CAPS capsule Take 1 capsule by mouth daily.      . rosuvastatin (CRESTOR) 20 MG tablet Take 10 mg by mouth at bedtime.      . sertraline (ZOLOFT) 50 MG tablet Take 50 mg by mouth daily.       No current facility-administered medications for this visit.    Allergies:   Penicillins   Social History:  The patient  reports that she has never smoked. She has never used smokeless tobacco. She reports that she does not drink alcohol or use illicit drugs.   Family History:  The patient's family history includes Diabetes in her brother, brother, brother,  brother, brother, brother, brother, sister, sister, and sister; Heart attack in her brother; Heart disease in her brother, brother, brother, brother, brother, brother, and sister; Pancreatic cancer in her sister; Prostate cancer in her father.   ROS:  Please see the history of present illness.   No bleeding problems.  She denies any bleeding problems.   All other systems reviewed and negative.   PHYSICAL EXAM: VS:  BP 120/65  Pulse 108  Ht 5' 6.5" (1.689 m)  Wt 195 lb (88.451 kg)  BMI 31.01 kg/m2 Well nourished, well developed, in no acute distress HEENT: normal Neck: no JVD Cardiac:  normal S1, S2; irregularly irregular rhythm; no murmur Lungs:  clear to auscultation bilaterally, no wheezing, rhonchi or rales Abd: soft, nontender, no hepatomegaly Ext: very trace bilateral LE edema Skin: warm and dry Neuro:  CNs 2-12 intact, no focal abnormalities noted  EKG:  Atrial fibrillation, HR 108   ASSESSMENT AND PLAN:  1. Atrial fibrillation with RVR:  She remains in AFib with RVR and is symptomatic with fatigue.  She has been on a fairly good loading dose of Amiodarone for 2 weeks.  I reviewed her case with Dr. Sherren Mocha today.  We will plan DCCV next week.   2. HYPERTENSION: Controlled. 3. HYPERCHOLESTEROLEMIA: Continue statin. 4. Post-surgical hypothyroidism: We will need to keep a close eye on her TSH with the addition of amiodarone. 5. Disposition: Followup with [name] or me 2 weeks after her DCCV.   Signed, Versie Starks, MHS 04/23/2014 3:31 PM    [facility] [place], [place], [place]  [address] Phone: [phone]; Fax: [phone]

## 2014-04-23 NOTE — Patient Instructions (Signed)
Your physician has recommended that you have a Cardioversion (DCCV). Electrical Cardioversion uses a jolt of electricity to your heart either through paddles or wired patches attached to your chest. This is a controlled, usually prescheduled, procedure. Defibrillation is done under light anesthesia in the hospital, and you usually go home the day of the procedure. This is done to get your heart back into a normal rhythm. You are not awake for the procedure. Please see the instruction sheet given to you today.  NO CHANGES WERE MADE TODAY

## 2014-04-23 NOTE — Progress Notes (Signed)
Cardiology Office Note    Date:  04/23/2014   ID:  [name], DOB 1931/09/13, MRN [id number]  PCP:  Irven Shelling, MD  Cardiologist:  Dr. Sherren Mocha      History of Present Illness: [name] is a 80 y.o. female with a remote history of heart murmur previously followed by Dr. Glade Lloyd, DM, HTN. She was recently admitted 03/2014 with atrial fibrillation with RVR. Heart rate was difficult to control on IV diltiazem. She was placed on Eliquis for anticoagulation.  She underwent TEE guided cardioversion with restoration of NSR.    I saw her in f/u 5/28.  She was back in AFib.  I placed her on Amiodarone and brought her back today for follow up.  We plan to try DCCV again once she has been adequately loaded with Amiodarone.  She is feeling better.  However, she is still fatigued.  Otherwise, denies chest pain, dyspnea, syncope, orthopnea, PND, edema.     Studies:  - TEE (03/2014): No LAA clot, moderate LAE, core triatriatum type structure in RA with no stenosis, normal EF 55-60%, mild MR   Recent Labs: 03/28/2014: ALT 15; TSH 1.130  03/31/2014: Creatinine 0.80; Hemoglobin 12.8; Potassium 4.2   Wt Readings from Last 3 Encounters:  04/23/14 195 lb (88.451 kg)  04/09/14 198 lb (89.812 kg)  03/31/14 196 lb 12.8 oz (89.268 kg)     Past Medical History  Diagnosis Date  . Hypothyroidism, postsurgical   . Hypertension   . Glaucoma   . Cervical cancer   . Breast cancer     s/p right breast lumpectomy  . Thyroid cancer     s/p thyroidectomy  . Atrial fibrillation   . Heart murmur     "slight" (03/31/2014)  . Type II diabetes mellitus   . OSA on CPAP   . H/O hiatal hernia   . GERD (gastroesophageal reflux disease)   . Migraine     "last one was years ago" (03/31/2014)  . DJD (degenerative joint disease) of knee   . Arthritis     "all my joints" (03/31/2014)    Current Outpatient Prescriptions  Medication Sig Dispense Refill  . amiodarone (PACERONE) 200 MG  tablet Take 400 mg twice daily x 2 weeks; then decrease to 200 mg twice daily  90 tablet  11  . apixaban (ELIQUIS) 5 MG TABS tablet Take 1 tablet (5 mg total) by mouth 2 (two) times daily.  60 tablet  11  . bimatoprost (LUMIGAN) 0.01 % SOLN Apply to eye. Eye drop      . bimatoprost (LUMIGAN) 0.03 % ophthalmic solution Place 1 drop into both eyes at bedtime.      . Calcium-Phosphorus-Vitamin D (CITRACAL +D3 PO) Take 1 tablet by mouth every evening.      . Cyanocobalamin (B-12 PO) Take 1 tablet by mouth daily.      Marland Kitchen diltiazem (CARDIZEM CD) 240 MG 24 hr capsule Take 240 mg by mouth daily. (CARTIA)      . FeFum-FePo-FA-B Cmp-C-Zn-Mn-Cu (TANDEM PLUS) 162-115.2-1 MG CAPS Take 1 tablet by mouth daily as needed (only as needed).      . lansoprazole (PREVACID) 30 MG capsule Take 30 mg by mouth every morning.      Marland Kitchen levothyroxine (SYNTHROID, LEVOTHROID) 137 MCG tablet Take 137 mcg by mouth daily before breakfast.      . LORazepam (ATIVAN) 1 MG tablet Take 1.5 mg by mouth at bedtime.      Marland Kitchen losartan-hydrochlorothiazide (HYZAAR)  100-25 MG per tablet Take 1 tablet by mouth daily.  30 tablet  11  . magnesium oxide (MAG-OX) 400 MG tablet Take 400 mg by mouth 2 (two) times daily.      . metFORMIN (GLUMETZA) 500 MG (MOD) 24 hr tablet Take 500-750 mg by mouth 2 (two) times daily with a meal. 750mg  in am, 500mg  in pm      . multivitamin-lutein (OCUVITE-LUTEIN) CAPS capsule Take 1 capsule by mouth daily.      . rosuvastatin (CRESTOR) 20 MG tablet Take 10 mg by mouth at bedtime.      . sertraline (ZOLOFT) 50 MG tablet Take 50 mg by mouth daily.       No current facility-administered medications for this visit.    Allergies:   Penicillins   Social History:  The patient  reports that she has never smoked. She has never used smokeless tobacco. She reports that she does not drink alcohol or use illicit drugs.   Family History:  The patient's family history includes Diabetes in her brother, brother, brother,  brother, brother, brother, brother, sister, sister, and sister; Heart attack in her brother; Heart disease in her brother, brother, brother, brother, brother, brother, and sister; Pancreatic cancer in her sister; Prostate cancer in her father.   ROS:  Please see the history of present illness.   No bleeding problems.  She denies any bleeding problems.   All other systems reviewed and negative.   PHYSICAL EXAM: VS:  BP 120/65  Pulse 108  Ht 5' 6.5" (1.689 m)  Wt 195 lb (88.451 kg)  BMI 31.01 kg/m2 Well nourished, well developed, in no acute distress HEENT: normal Neck: no JVD Cardiac:  normal S1, S2; irregularly irregular rhythm; no murmur Lungs:  clear to auscultation bilaterally, no wheezing, rhonchi or rales Abd: soft, nontender, no hepatomegaly Ext: very trace bilateral LE edema Skin: warm and dry Neuro:  CNs 2-12 intact, no focal abnormalities noted  EKG:  Atrial fibrillation, HR 108   ASSESSMENT AND PLAN:  1. Atrial fibrillation with RVR:  She remains in AFib with RVR and is symptomatic with fatigue.  She has been on a fairly good loading dose of Amiodarone for 2 weeks.  I reviewed her case with Dr. Sherren Mocha today.  We will plan DCCV next week.   2. HYPERTENSION: Controlled. 3. HYPERCHOLESTEROLEMIA: Continue statin. 4. Post-surgical hypothyroidism: We will need to keep a close eye on her TSH with the addition of amiodarone. 5. Disposition: Followup with [name] or me 2 weeks after her DCCV.   Signed, Versie Starks, MHS 04/23/2014 3:31 PM    [facility] [place], [place], [place]  [address] Phone: [phone]; Fax: [phone]

## 2014-04-24 ENCOUNTER — Other Ambulatory Visit: Payer: Self-pay | Admitting: *Deleted

## 2014-04-24 DIAGNOSIS — E876 Hypokalemia: Secondary | ICD-10-CM

## 2014-04-24 LAB — BASIC METABOLIC PANEL
BUN: 22 mg/dL (ref 6–23)
CHLORIDE: 103 meq/L (ref 96–112)
CO2: 28 mEq/L (ref 19–32)
Calcium: 9.1 mg/dL (ref 8.4–10.5)
Creatinine, Ser: 1 mg/dL (ref 0.4–1.2)
GFR: 54.04 mL/min — ABNORMAL LOW (ref >60.00)
GLUCOSE: 139 mg/dL — AB (ref 70–99)
Potassium: 3.4 mEq/L — ABNORMAL LOW (ref 3.5–5.1)
Sodium: 139 mEq/L (ref 135–145)

## 2014-04-24 MED ORDER — POTASSIUM CHLORIDE CRYS ER 20 MEQ PO TBCR: EXTENDED_RELEASE_TABLET | ORAL | Status: DC

## 2014-04-27 ENCOUNTER — Other Ambulatory Visit: Payer: Self-pay | Admitting: *Deleted

## 2014-04-27 ENCOUNTER — Other Ambulatory Visit (INDEPENDENT_AMBULATORY_CARE_PROVIDER_SITE_OTHER): Payer: Medicare Other

## 2014-04-27 ENCOUNTER — Encounter (HOSPITAL_COMMUNITY)
Admission: RE | Disposition: A | Discharge status: Home / Self Care | Payer: Self-pay | Source: Ambulatory Visit | Attending: Cardiovascular Disease

## 2014-04-27 ENCOUNTER — Ambulatory Visit (HOSPITAL_COMMUNITY)
Admission: RE | Admit: 2014-04-27 | Discharge: 2014-04-27 | Disposition: A | Discharge status: Home / Self Care | Payer: Medicare Other | Source: Ambulatory Visit | Attending: Cardiovascular Disease | Admitting: Cardiovascular Disease

## 2014-04-27 ENCOUNTER — Encounter (HOSPITAL_COMMUNITY): Payer: Self-pay | Admitting: Certified Registered Nurse Anesthetist

## 2014-04-27 ENCOUNTER — Other Ambulatory Visit: Payer: Medicare Other

## 2014-04-27 ENCOUNTER — Telehealth: Payer: Self-pay | Admitting: Physician Assistant

## 2014-04-27 ENCOUNTER — Encounter (HOSPITAL_COMMUNITY): Payer: Medicare Other | Admitting: Certified Registered Nurse Anesthetist

## 2014-04-27 ENCOUNTER — Ambulatory Visit (HOSPITAL_COMMUNITY): Payer: Medicare Other | Admitting: Certified Registered Nurse Anesthetist

## 2014-04-27 DIAGNOSIS — Z8541 Personal history of malignant neoplasm of cervix uteri: Secondary | ICD-10-CM | POA: Insufficient documentation

## 2014-04-27 DIAGNOSIS — M1710 Unilateral primary osteoarthritis, unspecified knee: Secondary | ICD-10-CM | POA: Insufficient documentation

## 2014-04-27 DIAGNOSIS — Z79899 Other long term (current) drug therapy: Secondary | ICD-10-CM | POA: Insufficient documentation

## 2014-04-27 DIAGNOSIS — E890 Postprocedural hypothyroidism: Secondary | ICD-10-CM | POA: Insufficient documentation

## 2014-04-27 DIAGNOSIS — E876 Hypokalemia: Secondary | ICD-10-CM

## 2014-04-27 DIAGNOSIS — I4891 Unspecified atrial fibrillation: Secondary | ICD-10-CM

## 2014-04-27 DIAGNOSIS — IMO0002 Reserved for concepts with insufficient information to code with codable children: Secondary | ICD-10-CM | POA: Insufficient documentation

## 2014-04-27 DIAGNOSIS — I10 Essential (primary) hypertension: Secondary | ICD-10-CM | POA: Insufficient documentation

## 2014-04-27 DIAGNOSIS — H409 Unspecified glaucoma: Secondary | ICD-10-CM | POA: Insufficient documentation

## 2014-04-27 DIAGNOSIS — E119 Type 2 diabetes mellitus without complications: Secondary | ICD-10-CM | POA: Insufficient documentation

## 2014-04-27 DIAGNOSIS — M129 Arthropathy, unspecified: Secondary | ICD-10-CM | POA: Insufficient documentation

## 2014-04-27 DIAGNOSIS — K219 Gastro-esophageal reflux disease without esophagitis: Secondary | ICD-10-CM | POA: Insufficient documentation

## 2014-04-27 DIAGNOSIS — Z853 Personal history of malignant neoplasm of breast: Secondary | ICD-10-CM | POA: Insufficient documentation

## 2014-04-27 DIAGNOSIS — Z85850 Personal history of malignant neoplasm of thyroid: Secondary | ICD-10-CM | POA: Insufficient documentation

## 2014-04-27 HISTORY — PX: CARDIOVERSION: SHX1299

## 2014-04-27 LAB — BASIC METABOLIC PANEL
BUN: 16 mg/dL (ref 6–23)
CHLORIDE: 103 meq/L (ref 96–112)
CO2: 30 mEq/L (ref 19–32)
Calcium: 9.2 mg/dL (ref 8.4–10.5)
Creatinine, Ser: 0.9 mg/dL (ref 0.4–1.2)
GFR: 63.85 mL/min (ref >60.00)
Glucose, Bld: 162 mg/dL — ABNORMAL HIGH (ref 70–99)
POTASSIUM: 4.2 meq/L (ref 3.5–5.1)
Sodium: 141 mEq/L (ref 135–145)

## 2014-04-27 LAB — GLUCOSE, CAPILLARY: GLUCOSE-CAPILLARY: 124 mg/dL — AB (ref 70–99)

## 2014-04-27 SURGERY — CARDIOVERSION
Anesthesia: Monitor Anesthesia Care

## 2014-04-27 MED ORDER — SODIUM CHLORIDE 0.9 % IV SOLN
INTRAVENOUS | Status: DC
  Administered 2014-04-27: 500 mL via INTRAVENOUS

## 2014-04-27 MED ORDER — AMIODARONE HCL 200 MG PO TABS: 200.0000 mg | ORAL_TABLET | Freq: Two times a day (BID) | ORAL | Status: DC

## 2014-04-27 MED ORDER — SODIUM CHLORIDE 0.9 % IV SOLN
INTRAVENOUS | Status: DC | PRN
  Administered 2014-04-27: 13:00:00 via INTRAVENOUS

## 2014-04-27 MED ORDER — PROPOFOL 10 MG/ML IV BOLUS
INTRAVENOUS | Status: DC | PRN
  Administered 2014-04-27: 60 mg via INTRAVENOUS

## 2014-04-27 MED ORDER — LIDOCAINE HCL (CARDIAC) 20 MG/ML IV SOLN
INTRAVENOUS | Status: DC | PRN
  Administered 2014-04-27: 40 mg via INTRAVENOUS

## 2014-04-27 NOTE — Transfer of Care (Signed)
Immediate Anesthesia Transfer of Care Note  Patient: [name]  Procedure(s) Performed: Procedure(s): CARDIOVERSION (N/A)  Patient Location: PACU  Anesthesia Type:MAC  Level of Consciousness: awake, alert , oriented, patient cooperative and lethargic  Airway & Oxygen Therapy: Patient Spontanous Breathing and Patient connected to nasal cannula oxygen  Post-op Assessment: Report given to PACU RN, Post -op Vital signs reviewed and stable and Patient moving all extremities  Post vital signs: Reviewed and stable  Complications: No apparent anesthesia complications

## 2014-04-27 NOTE — Discharge Instructions (Signed)
Electrical Cardioversion, Care After Refer to this sheet in the next few weeks. These instructions provide you with information on caring for yourself after your procedure. Your health care provider may also give you more specific instructions. Your treatment has been planned according to current medical practices, but problems sometimes occur. Call your health care provider if you have any problems or questions after your procedure. WHAT TO EXPECT AFTER THE PROCEDURE After your procedure, it is typical to have the following sensations:  Some redness on the skin where the shocks were delivered. If this is tender, a sunburn lotion or hydrocortisone cream may help.  Possible return of an abnormal heart rhythm within hours or days after the procedure. HOME CARE INSTRUCTIONS  Only take medicine as directed by your health care provider. Be sure you understand how and when to take your medicine.  Learn how to feel your pulse and check it often.  Limit your activity for 48 hours after the procedure or as directed.  Avoid or minimize caffeine and other stimulants as directed. SEEK MEDICAL CARE IF:  You feel like your heart is beating too fast or your pulse is not regular.  You have any questions about your medicines.  You have bleeding that will not stop. SEEK IMMEDIATE MEDICAL CARE IF:  You are dizzy or feel faint.  It is hard to breathe or you feel short of breath.  There is a change in discomfort in your chest.  Your speech is slurred or you have trouble moving an arm or leg on one side of your body.  You get a serious muscle cramp that does not go away.  Your fingers or toes turn cold or blue. MAKE SURE YOU:   Understand these instructions.   Will watch your condition.   Will get help right away if you are not doing well or get worse. Document Released: 08/20/2013 Document Reviewed: 05/14/2013 [facility] Patient Information 2014 [place], [place].

## 2014-04-27 NOTE — Anesthesia Preprocedure Evaluation (Addendum)
Anesthesia Evaluation  Patient identified by MRN, date of birth, ID band Patient awake    Reviewed: Allergy & Precautions, H&P , NPO status , Patient's Chart, lab work & pertinent test results  Airway Mallampati: II TM Distance: >3 FB Neck ROM: Full    Dental  (+) Teeth Intact, Dental Advisory Given   Pulmonary sleep apnea and Continuous Positive Airway Pressure Ventilation ,    Pulmonary exam normal       Cardiovascular hypertension, Pt. on medications Rhythm:Irregular  03/30/14 EF 55-60%   Neuro/Psych    GI/Hepatic hiatal hernia, GERD-  ,  Endo/Other  diabetes, Type 2, Oral Hypoglycemic Agents  Renal/GU      Musculoskeletal   Abdominal   Peds  Hematology   Anesthesia Other Findings   Reproductive/Obstetrics                         Anesthesia Physical Anesthesia Plan  ASA: III  Anesthesia Plan: MAC   Post-op Pain Management:    Induction: Intravenous  Airway Management Planned: Mask and Natural Airway  Additional Equipment:   Intra-op Plan:   Post-operative Plan:   Informed Consent: I have reviewed the patients History and Physical, chart, labs and discussed the procedure including the risks, benefits and alternatives for the proposed anesthesia with the patient or authorized representative who has indicated his/her understanding and acceptance.   Dental advisory given  Plan Discussed with: CRNA, Anesthesiologist and Surgeon  Anesthesia Plan Comments:         Anesthesia Quick Evaluation

## 2014-04-27 NOTE — Anesthesia Postprocedure Evaluation (Signed)
  Anesthesia Post-op Note  Patient: [name]  Procedure(s) Performed: Procedure(s): CARDIOVERSION (N/A)  Patient Location: Endoscopy Unit  Anesthesia Type:General  Level of Consciousness: awake, alert  and oriented  Airway and Oxygen Therapy: Patient Spontanous Breathing and Patient connected to nasal cannula oxygen  Post-op Pain: mild  Post-op Assessment: Post-op Vital signs reviewed, Patient's Cardiovascular Status Stable, Respiratory Function Stable, Patent Airway and Pain level controlled  Post-op Vital Signs: stable  Last Vitals:  Filed Vitals:   04/27/14 1440  BP: 140/68  Pulse: 61  Temp:   Resp: 29    Complications: No apparent anesthesia complications

## 2014-04-27 NOTE — Anesthesia Procedure Notes (Signed)
Procedure Name: MAC Date/Time: 04/27/2014 1:35 PM Performed by: Ned Grace Pre-anesthesia Checklist: Patient identified, Timeout performed, Emergency Drugs available, Suction available and Patient being monitored Patient Re-evaluated:Patient Re-evaluated prior to inductionOxygen Delivery Method: Ambu bag Intubation Type: IV induction Ventilation: Mask ventilation without difficulty

## 2014-04-27 NOTE — Telephone Encounter (Signed)
pt notified about lab results witb verbal understanding

## 2014-04-27 NOTE — CV Procedure (Signed)
Electrical Cardioversion Procedure Note [name] [id number] November 30, 1930  Procedure: Electrical Cardioversion Indications:  Atrial Fibrillation  Procedure Details Consent: Risks of procedure as well as the alternatives and risks of each were explained to the (patient/caregiver).  Consent for procedure obtained. Time Out: Verified patient identification, verified procedure, site/side was marked, verified correct patient position, special equipment/implants available, medications/allergies/relevent history reviewed, required imaging and test results available.  Performed  Patient placed on cardiac monitor, pulse oximetry, supplemental oxygen as necessary.  Sedation given: propofol 60 mg Pacer pads placed anterior and posterior chest.  Cardioverted 1 time(s).  Cardioverted at 120J.  Evaluation Findings: Post procedure EKG shows: NSR Complications: None Patient did tolerate procedure well.   Darlin Coco 04/27/2014, 1:39 PM

## 2014-04-28 ENCOUNTER — Encounter (HOSPITAL_COMMUNITY): Payer: Self-pay | Admitting: Cardiology

## 2014-05-06 ENCOUNTER — Other Ambulatory Visit: Payer: Self-pay | Admitting: Cardiovascular Disease

## 2014-05-12 ENCOUNTER — Encounter: Payer: Self-pay | Admitting: Physician Assistant

## 2014-05-12 ENCOUNTER — Ambulatory Visit (INDEPENDENT_AMBULATORY_CARE_PROVIDER_SITE_OTHER): Payer: Medicare Other | Admitting: Physician Assistant

## 2014-05-12 VITALS — BP 111/69 | HR 66 | Ht 66.5 in | Wt 196.0 lb

## 2014-05-12 DIAGNOSIS — I4891 Unspecified atrial fibrillation: Secondary | ICD-10-CM

## 2014-05-12 DIAGNOSIS — I10 Essential (primary) hypertension: Secondary | ICD-10-CM

## 2014-05-12 DIAGNOSIS — E890 Postprocedural hypothyroidism: Secondary | ICD-10-CM

## 2014-05-12 DIAGNOSIS — E7800 Pure hypercholesterolemia, unspecified: Secondary | ICD-10-CM

## 2014-05-12 DIAGNOSIS — Z79899 Other long term (current) drug therapy: Secondary | ICD-10-CM

## 2014-05-12 MED ORDER — AMIODARONE HCL 200 MG PO TABS: 200.0000 mg | ORAL_TABLET | Freq: Every day | ORAL | Status: DC

## 2014-05-12 NOTE — Patient Instructions (Signed)
DECREASE AMIODARONE TO 200 MG DAILY  Your physician has recommended that you have a pulmonary function test. Pulmonary Function Tests are a group of tests that measure how well air moves in and out of your lungs.  Your physician recommends that you schedule a follow-up appointment in: [place] [facility] FOR LAB WORK WITH PRIMARY CARE  (TSH) PLEASE HAVE RESULTS FAXED TO Jacinto Reap, [place]

## 2014-05-12 NOTE — Progress Notes (Signed)
Cardiology Office Note    Date:  05/12/2014   ID:  [name], DOB 1931-05-09, MRN [id number]  PCP:  Irven Shelling, MD  Cardiologist:  Dr. Sherren Mocha      History of Present Illness: [name] is a 80 y.o. female with a remote history of heart murmur previously followed by Dr. Glade Lloyd, DM, HTN. She was recently admitted 03/2014 with atrial fibrillation with RVR. Heart rate was difficult to control on IV diltiazem. She was placed on Eliquis for anticoagulation.  She underwent TEE guided cardioversion with restoration of NSR. However, she reverted back to AFib.   I placed her on Amiodarone.  She underwent DCCV after appropriate loading dose of Amiodarone.   She underwent DCCV 6/15 with restoration of NSR.  She returns for follow up.  She is feeling much better.  She has more energy.  She denies chest pain, dyspnea, dizziness, syncope, orthopnea, PND, edema.     Studies:  - TEE (03/2014): No LAA clot, moderate LAE, core triatriatum type structure in RA with no stenosis, normal EF 55-60%, mild MR   Recent Labs: 03/28/2014: ALT 15; TSH 1.130  03/31/2014: Hemoglobin 12.8  04/27/2014: Creatinine 0.9; Potassium 4.2   Wt Readings from Last 3 Encounters:  05/12/14 196 lb (88.905 kg)  04/23/14 195 lb (88.451 kg)  04/09/14 198 lb (89.812 kg)     Past Medical History  Diagnosis Date  . Hypothyroidism, postsurgical   . Hypertension   . Glaucoma   . Cervical cancer   . Breast cancer     s/p right breast lumpectomy  . Thyroid cancer     s/p thyroidectomy  . Atrial fibrillation   . 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 Type II diabetes mellitus   . OSA on CPAP   . H/O hiatal hernia   . GERD (gastroesophageal reflux disease)   . Migraine     "last one was years ago" (03/31/2014)  . DJD (degenerative joint disease)  of knee   . Arthritis     "all my joints" (03/31/2014)    Current Outpatient Prescriptions  Medication Sig Dispense Refill  . amiodarone (PACERONE) 200 MG tablet Take 1 tablet (200 mg total) by mouth 2 (two) times daily.  60 tablet  1  . apixaban (ELIQUIS) 5 MG TABS tablet Take 1 tablet (5 mg total) by mouth 2 (two) times daily.  60 tablet  11  . bimatoprost (LUMIGAN) 0.01 % SOLN Apply to eye. Eye drop      . Calcium-Phosphorus-Vitamin D (CITRACAL +D3 PO) Take 1 tablet by mouth every evening.      . Cyanocobalamin (B-12 PO) Take 1 tablet by mouth daily.      Marland Kitchen diltiazem (CARDIZEM CD) 240 MG 24 hr capsule Take 240 mg by mouth daily. (CARTIA)      . FeFum-FePo-FA-B Cmp-C-Zn-Mn-Cu (TANDEM PLUS) 162-115.2-1 MG CAPS Take 1 tablet by mouth daily as needed (only as needed).      . lansoprazole (PREVACID) 30 MG capsule Take 30 mg by mouth every morning.      Marland Kitchen levothyroxine (SYNTHROID, LEVOTHROID) 137 MCG tablet Take 137 mcg by mouth daily before breakfast.      . LORazepam (ATIVAN) 1 MG tablet Take 1.5 mg by mouth at bedtime.      Marland Kitchen losartan-hydrochlorothiazide (HYZAAR) 100-25 MG per  tablet Take 1 tablet by mouth daily.  30 tablet  11  . magnesium oxide (MAG-OX) 400 MG tablet Take 400 mg by mouth 2 (two) times daily.      . metFORMIN (GLUMETZA) 500 MG (MOD) 24 hr tablet Take 500-750 mg by mouth 2 (two) times daily with a meal. 750mg  in am, 500mg  in pm      . multivitamin-lutein (OCUVITE-LUTEIN) CAPS capsule Take 1 capsule by mouth daily.      . potassium chloride SA (KLOR-CON M20) 20 MEQ tablet Take 2 tablets now; then 1 tablet daily  30 tablet  11  . rosuvastatin (CRESTOR) 20 MG tablet Take 10 mg by mouth at bedtime.      . sertraline (ZOLOFT) 50 MG tablet Take 50 mg by mouth daily.       No current facility-administered medications for this visit.    Allergies:   Penicillins   Social History:  The patient  reports that she has never smoked. She has never used smokeless tobacco. She reports  that she does not drink alcohol or use illicit drugs.   Family History:  The patient's family history includes Diabetes in her brother, brother, brother, brother, brother, brother, brother, sister, sister, and sister; Heart attack in her brother; Heart disease in her brother, brother, brother, brother, brother, brother, and sister; Pancreatic cancer in her sister; Prostate cancer in her father.   ROS:  Please see the history of present illness.   No bleeding problems.      All other systems reviewed and negative.   PHYSICAL EXAM: VS:  BP 111/69  Pulse 66  Ht 5' 6.5" (1.689 m)  Wt 196 lb (88.905 kg)  BMI 31.16 kg/m2 Well nourished, well developed, in no acute distress HEENT: normal Neck: no JVD Cardiac:  normal S1, S2; RRR; no murmur Lungs:  clear to auscultation bilaterally, no wheezing, rhonchi or rales Abd: soft, nontender, no hepatomegaly Ext: no LE edema Skin: warm and dry Neuro:  CNs 2-12 intact, no focal abnormalities noted  EKG:  NSR, HR 66, normal axis, no ST changes, QTc 486   ASSESSMENT AND PLAN:  1. Atrial fibrillation:  Maintaining NSR on Amiodarone s/p DCCV.  Continue Apixaban.  Arrange PFTs with DLCO.  I recommended annual eye exams.  She is to see her PCP soon for wellness exam.  I will request that her TSH be checked then.     2. HYPERTENSION: Controlled. 3. HYPERCHOLESTEROLEMIA: Continue statin. 4. Post-surgical hypothyroidism: Keep a close eye on her TSH with the addition of amiodarone.  This will be done in 2 weeks with her PCP. 5. Disposition: Followup with [name] in 3 mos.   Signed, Versie Starks, MHS 05/12/2014 12:59 PM    [facility], [place], [place]  [address] Phone: [phone]; Fax: [phone]

## 2014-05-21 ENCOUNTER — Ambulatory Visit (HOSPITAL_COMMUNITY)
Admission: RE | Admit: 2014-05-21 | Discharge: 2014-05-21 | Disposition: A | Discharge status: Home / Self Care | Payer: Medicare Other | Source: Ambulatory Visit | Attending: Physician Assistant | Admitting: Physician Assistant

## 2014-05-21 ENCOUNTER — Telehealth: Payer: Self-pay | Admitting: *Deleted

## 2014-05-21 DIAGNOSIS — Z79899 Other long term (current) drug therapy: Secondary | ICD-10-CM

## 2014-05-21 LAB — PULMONARY FUNCTION TEST
DL/VA % pred: 122 %
DL/VA: 6.15 ml/min/mmHg/L
DLCO cor % pred: 100 %
DLCO cor: 27.03 ml/min/mmHg
DLCO unc % pred: 100 %
DLCO unc: 27.03 ml/min/mmHg
FEF 25-75 Post: 1.25 L/sec
FEF 25-75 Pre: 1.4 L/sec
FEF2575-%Change-Post: -10 %
FEF2575-%Pred-Post: 84 %
FEF2575-%Pred-Pre: 95 %
FEV1-%Change-Post: -2 %
FEV1-%PRED-POST: 91 %
FEV1-%Pred-Pre: 93 %
FEV1-POST: 1.91 L
FEV1-Pre: 1.96 L
FEV1FVC-%CHANGE-POST: 1 %
FEV1FVC-%Pred-Pre: 97 %
FEV6-%Change-Post: -3 %
FEV6-%PRED-PRE: 101 %
FEV6-%Pred-Post: 98 %
FEV6-PRE: 2.7 L
FEV6-Post: 2.62 L
FEV6FVC-%Change-Post: 0 %
FEV6FVC-%Pred-Post: 104 %
FEV6FVC-%Pred-Pre: 104 %
FVC-%Change-Post: -3 %
FVC-%Pred-Post: 93 %
FVC-%Pred-Pre: 97 %
FVC-PRE: 2.73 L
FVC-Post: 2.63 L
POST FEV6/FVC RATIO: 100 %
PRE FEV6/FVC RATIO: 99 %
Post FEV1/FVC ratio: 73 %
Pre FEV1/FVC ratio: 72 %
RV % PRED: 104 %
RV: 2.63 L
TLC % PRED: 104 %
TLC: 5.57 L

## 2014-05-21 MED ORDER — ALBUTEROL SULFATE (2.5 MG/3ML) 0.083% IN NEBU
2.5000 mg | INHALATION_SOLUTION | Freq: Once | RESPIRATORY_TRACT | Status: AC
  Administered 2014-05-21: 2.5 mg via RESPIRATORY_TRACT

## 2014-05-21 NOTE — Telephone Encounter (Signed)
pt notified about PFT results with verbal understanding

## 2014-06-12 ENCOUNTER — Telehealth: Payer: Self-pay | Admitting: Cardiovascular Disease

## 2014-06-12 NOTE — Telephone Encounter (Signed)
Pt aware she can not take Eliquis and Celebrex together

## 2014-06-12 NOTE — Telephone Encounter (Signed)
New Prob   Pt has some questions regarding her Celebrex medications. Please call.

## 2014-07-01 ENCOUNTER — Telehealth: Payer: Self-pay | Admitting: Physician Assistant

## 2014-07-01 NOTE — Telephone Encounter (Signed)
Contacted pt to inform her that I updated Julaine Hua, CMA for [employer] PA-C, of pts BP readings upon waking and wanting an ov next week with Nicki Reaper.  Informed pt that Arbie Cookey will update Nicki Reaper as well next week and follow up with her after he advises.  Informed pt that she should continue with current medication regimen as prescribed, and continue with her low sodium diet and drinking plenty of fluids.  Pt verbalized understanding and agrees with this plan.

## 2014-07-01 NOTE — Telephone Encounter (Signed)
°  Patient has questions regarding medication. Please call and advise.

## 2014-07-01 NOTE — Telephone Encounter (Signed)
Pt calling Scott Weaver PA-C and CMA Julaine Hua to update them on her current BP readings.  Pt states that her BP medication that Richardson Dopp prescribed her is not lasting as long as it should.  Pt reports that she needs to be prescribed a BP med that last all day and all night.  Pt states when she wakes up in the morning her BP is elevated, but after she takes her BP medication with her breakfast, it resumes back to normal at about 110/70s, and works all day.  Pt reports these BP readings upon waking every morning:  8/17 at 5 am- 163/85 HR-66,   8/18 at 5am- 163/79 HR-66,  8/19 AT 6am- 165/86 HR-68 Pt requesting a better BP medicine that will keep her pressures down throughout the night.  Pt states she has been following Scotts instructions to maintain a low sodium diet.  Pt also demanding a OV with Richardson Dopp PA-C as well.  Informed pt that Nicki Reaper is on vacation this week, but I will forward this message to him for further review and recommendation.  Informed pt that I will talk with Arbie Cookey his CMA about pts concerns and f/u with her thereafter.  Pt verbalized understanding and agrees with this plan.

## 2014-07-05 NOTE — Telephone Encounter (Signed)
She can try changing Cardizem to take in the evening instead of the morning. Schedule follow up appointment with me. Richardson Dopp, PA-C   07/05/2014 6:53 AM

## 2014-07-07 NOTE — Telephone Encounter (Signed)
cbpt to advise per Brynda Rim. PA to try taking cardizem in the evening, pt asid she already does at 5:30-6 pm. I asked for her to try and take it about 8:30-9 pm to see if that helps BP better in AM. Scheduled pt f/u w/Scott W. PA 9/4 12:10 BP ck pt aware.

## 2014-07-17 ENCOUNTER — Ambulatory Visit (INDEPENDENT_AMBULATORY_CARE_PROVIDER_SITE_OTHER): Payer: Medicare Other | Admitting: Physician Assistant

## 2014-07-17 ENCOUNTER — Encounter: Payer: Self-pay | Admitting: Physician Assistant

## 2014-07-17 VITALS — BP 122/74 | HR 67 | Ht 65.5 in | Wt 190.0 lb

## 2014-07-17 DIAGNOSIS — E7800 Pure hypercholesterolemia, unspecified: Secondary | ICD-10-CM

## 2014-07-17 DIAGNOSIS — I10 Essential (primary) hypertension: Secondary | ICD-10-CM

## 2014-07-17 DIAGNOSIS — I4891 Unspecified atrial fibrillation: Secondary | ICD-10-CM

## 2014-07-17 DIAGNOSIS — I480 Paroxysmal atrial fibrillation: Secondary | ICD-10-CM

## 2014-07-17 DIAGNOSIS — R42 Dizziness and giddiness: Secondary | ICD-10-CM

## 2014-07-17 DIAGNOSIS — Z79899 Other long term (current) drug therapy: Secondary | ICD-10-CM

## 2014-07-17 DIAGNOSIS — E890 Postprocedural hypothyroidism: Secondary | ICD-10-CM

## 2014-07-17 LAB — CBC
HCT: 38.8 % (ref 36.0–46.0)
Hemoglobin: 12.8 g/dL (ref 12.0–15.0)
MCHC: 33 g/dL (ref 30.0–36.0)
MCV: 90.4 fl (ref 78.0–100.0)
PLATELETS: 196 10*3/uL (ref 150.0–400.0)
RBC: 4.29 Mil/uL (ref 3.87–5.11)
RDW: 15.8 % — ABNORMAL HIGH (ref 11.5–15.5)
WBC: 5.3 10*3/uL (ref 4.0–10.5)

## 2014-07-17 LAB — COMPREHENSIVE METABOLIC PANEL
ALT: 29 U/L (ref 0–35)
AST: 24 U/L (ref 0–37)
Albumin: 4 g/dL (ref 3.5–5.2)
Alkaline Phosphatase: 69 U/L (ref 39–117)
BUN: 29 mg/dL — ABNORMAL HIGH (ref 6–23)
CALCIUM: 9.5 mg/dL (ref 8.4–10.5)
CHLORIDE: 100 meq/L (ref 96–112)
CO2: 30 meq/L (ref 19–32)
Creatinine, Ser: 1 mg/dL (ref 0.4–1.2)
GFR: 57.84 mL/min — AB (ref >60.00)
GLUCOSE: 128 mg/dL — AB (ref 70–99)
POTASSIUM: 3.6 meq/L (ref 3.5–5.1)
Sodium: 138 mEq/L (ref 135–145)
TOTAL PROTEIN: 7.4 g/dL (ref 6.0–8.3)
Total Bilirubin: 0.6 mg/dL (ref 0.2–1.2)

## 2014-07-17 MED ORDER — LOSARTAN POTASSIUM-HCTZ 50-12.5 MG PO TABS: 1.0000 | ORAL_TABLET | Freq: Two times a day (BID) | ORAL | Status: DC

## 2014-07-17 NOTE — Patient Instructions (Signed)
Your physician recommends that you continue on your current medications as directed. Please refer to the Current Medication list given to you today.  Your physician recommends that you go to the lab today for a CMET and CBC  Your physician has recommended that you wear a holter monitor. Holter monitors are medical devices that record the heart's electrical activity. Doctors most often use these monitors to diagnose arrhythmias. Arrhythmias are problems with the speed or rhythm of the heartbeat. The monitor is a small, portable device. You can wear one while you do your normal daily activities. This is usually used to diagnose what is causing palpitations/syncope (passing out). (48 Hour)

## 2014-07-17 NOTE — Progress Notes (Signed)
Cardiology Office Note   Date:  07/17/2014   ID:  [name], DOB 03-05-1931, MRN [id number]  PCP:  Irven Shelling, MD  Cardiologist:  Dr. Sherren Mocha      History of Present Illness: [name] is a 80 y.o. female with a remote history of heart murmur previously followed by Dr. Glade Lloyd, DM, HTN. Admitted 03/2014 with atrial fibrillation with RVR. Heart rate was difficult to control on IV diltiazem. She was placed on Eliquis for anticoagulation.  She underwent TEE guided cardioversion with restoration of NSR. However, she reverted back to AFib.   I placed her on Amiodarone.  She underwent DCCV after appropriate loading dose of Amiodarone.   She underwent DCCV 6/15 with restoration of NSR.  Last seen 04/2014.  She had some elevated BPs recently and returns for evaluation.  However, since she called in her BP has been much better.  She has been taking her Hyzaar 50/12.5 mg bid.  The patient denies chest pain, shortness of breath, syncope, orthopnea, PND or significant pedal edema. She does note occasional dizziness.  Her PCP recently checked her TSH and she tells me her Synthroid dose was changed.     Studies:  - TEE (03/2014): No LAA clot, moderate LAE, core triatriatum type structure in RA with no stenosis, normal EF 55-60%, mild MR   Recent Labs: 03/28/2014: ALT 15; TSH 1.130  03/31/2014: Hemoglobin 12.8  04/27/2014: Creatinine 0.9; Potassium 4.2   Wt Readings from Last 3 Encounters:  07/17/14 190 lb (86.183 kg)  05/12/14 196 lb (88.905 kg)  04/23/14 195 lb (88.451 kg)     Past Medical History  Diagnosis Date  . Hypothyroidism, postsurgical   . Hypertension   . Glaucoma   . Cervical cancer   . Breast cancer     s/p right breast lumpectomy  . Thyroid cancer     s/p thyroidectomy  . Atrial fibrillation   . Hx of transesophageal echocardiography (TEE) for monitoring     TEE (03/2014): No LAA clot, moderate LAE, core triatriatum type structure in RA with no  stenosis, normal EF 55-60%, mild MR  . Type II diabetes mellitus   . OSA on CPAP   . H/O hiatal hernia   . GERD (gastroesophageal reflux disease)   . Migraine     "last one was years ago" (03/31/2014)  . DJD (degenerative joint disease) of knee   . Arthritis     "all my joints" (03/31/2014)    Current Outpatient Prescriptions  Medication Sig Dispense Refill  . amiodarone (PACERONE) 200 MG tablet Take 1 tablet (200 mg total) by mouth daily.      Marland Kitchen apixaban (ELIQUIS) 5 MG TABS tablet Take 1 tablet (5 mg total) by mouth 2 (two) times daily.  60 tablet  11  . bimatoprost (LUMIGAN) 0.01 % SOLN Apply to eye. Eye drop      . Calcium-Phosphorus-Vitamin D (CITRACAL +D3 PO) Take 1 tablet by mouth every evening.      . Cyanocobalamin (B-12 PO) Take 1 tablet by mouth daily.      Marland Kitchen diltiazem (CARDIZEM CD) 240 MG 24 hr capsule Take 240 mg by mouth daily. (CARTIA)      . FeFum-FePo-FA-B Cmp-C-Zn-Mn-Cu (TANDEM PLUS) 162-115.2-1 MG CAPS Take 1 tablet by mouth daily as needed (only as needed).      . lansoprazole (PREVACID) 30 MG capsule Take 30 mg by mouth every morning.      Marland Kitchen levothyroxine (SYNTHROID, LEVOTHROID) 150 MCG  tablet Take 150 mcg by mouth daily before breakfast.      . LORazepam (ATIVAN) 1 MG tablet Take 1.5 mg by mouth at bedtime.      Marland Kitchen losartan-hydrochlorothiazide (HYZAAR) 100-25 MG per tablet Take 1 tablet by mouth daily.  30 tablet  11  . magnesium oxide (MAG-OX) 400 MG tablet Take 400 mg by mouth 2 (two) times daily.      . metFORMIN (GLUMETZA) 500 MG (MOD) 24 hr tablet Take 500-750 mg by mouth 2 (two) times daily with a meal. 750mg  in am, 500mg  in pm      . multivitamin-lutein (OCUVITE-LUTEIN) CAPS capsule Take 1 capsule by mouth daily.      . potassium chloride SA (KLOR-CON M20) 20 MEQ tablet Take 2 tablets now; then 1 tablet daily  30 tablet  11  . rosuvastatin (CRESTOR) 20 MG tablet Take 10 mg by mouth at bedtime.      . sertraline (ZOLOFT) 50 MG tablet Take 50 mg by mouth daily.        No current facility-administered medications for this visit.    Allergies:   Penicillins   Social History:  The patient  reports that she has never smoked. She has never used smokeless tobacco. She reports that she does not drink alcohol or use illicit drugs.   Family History:  The patient's family history includes Diabetes in her brother, brother, brother, brother, brother, brother, brother, sister, sister, and sister; Heart attack in her brother; Heart disease in her brother, brother, brother, brother, brother, brother, and sister; Pancreatic cancer in her sister; Prostate cancer in her father.   ROS:  Please see the history of present illness. She denies any bleeding problems.  All other systems reviewed and negative.   PHYSICAL EXAM: VS:  BP 122/74  Pulse 67  Ht 5' 5.5" (1.664 m)  Wt 190 lb (86.183 kg)  BMI 31.13 kg/m2 Well nourished, well developed, in no acute distress HEENT: normal Neck: no JVD Cardiac:  normal S1, S2; RRR; no murmur Lungs:  clear to auscultation bilaterally, no wheezing, rhonchi or rales Abd: soft, nontender, no hepatomegaly Ext: no LE edema Skin: warm and dry Neuro:  CNs 2-12 intact, no focal abnormalities noted  EKG:  NSR, HR 67, normal axis, QTc 490, no change   ASSESSMENT AND PLAN:  1. Atrial fibrillation:  Maintaining NSR on Amiodarone.  Continue Apixaban.  Check FU CBC, LFTs.  Continue current dose of Diltiazem.     2. HYPERTENSION: Controlled.  Continue current regimen.  Check BMET. 3. HYPERCHOLESTEROLEMIA: Continue statin. 4. Dizziness:  Check 48 Hr. Holter to rule out bradyarrhythmias. 5. Post-surgical hypothyroidism: FU with PCP. 6. Disposition: FU with  [name] as planned.   Signed, Versie Starks, MHS 07/17/2014 12:40 PM    [facility] [place], [place], [place]  [address] Phone: [phone]; Fax: [phone]

## 2014-07-21 ENCOUNTER — Telehealth: Payer: Self-pay | Admitting: *Deleted

## 2014-07-21 ENCOUNTER — Encounter: Payer: Self-pay | Admitting: *Deleted

## 2014-07-21 ENCOUNTER — Encounter (INDEPENDENT_AMBULATORY_CARE_PROVIDER_SITE_OTHER): Payer: Medicare Other

## 2014-07-21 DIAGNOSIS — I480 Paroxysmal atrial fibrillation: Secondary | ICD-10-CM

## 2014-07-21 DIAGNOSIS — R42 Dizziness and giddiness: Secondary | ICD-10-CM

## 2014-07-21 DIAGNOSIS — I4891 Unspecified atrial fibrillation: Secondary | ICD-10-CM

## 2014-07-21 NOTE — Progress Notes (Signed)
Patient ID: [name], female   DOB: January 31, 1931, 80 y.o.   MRN: [id number] [facility] 48 hour holter monitor applied to patient.

## 2014-07-21 NOTE — Telephone Encounter (Signed)
pt notified about lab results with verbal understanding  

## 2014-07-22 ENCOUNTER — Other Ambulatory Visit: Payer: Self-pay | Admitting: Cardiovascular Disease

## 2014-07-24 ENCOUNTER — Ambulatory Visit: Payer: Medicare Other | Admitting: Physician Assistant

## 2014-08-26 ENCOUNTER — Telehealth: Payer: Self-pay | Admitting: *Deleted

## 2014-08-26 NOTE — Telephone Encounter (Signed)
pt had cb while I was in clinic. I cb pt; who states PCP follow's thyroid for her. Pt asked me to go over the results with her.

## 2014-08-26 NOTE — Telephone Encounter (Signed)
lmptcb to let pt know that we had labs faxed over the other day f [facility]. Per Brynda Rim. PA note on lab need to repeat TSH/Free T4 in 6 weeks unless PCP is following.

## 2014-08-26 NOTE — Telephone Encounter (Signed)
Follow up ° °Pt returned call  °

## 2014-09-07 ENCOUNTER — Ambulatory Visit (INDEPENDENT_AMBULATORY_CARE_PROVIDER_SITE_OTHER): Payer: Medicare Other | Admitting: Cardiovascular Disease

## 2014-09-07 ENCOUNTER — Encounter: Payer: Self-pay | Admitting: Cardiovascular Disease

## 2014-09-07 VITALS — BP 138/78 | HR 73 | Ht 65.5 in | Wt 194.8 lb

## 2014-09-07 DIAGNOSIS — I4891 Unspecified atrial fibrillation: Secondary | ICD-10-CM

## 2014-09-07 NOTE — Patient Instructions (Signed)
Your physician has recommended you make the following change in your medication: STOP Diltiazem  Your physician recommends that you return for lab work in: MAY 2016 (TSH, Liver)  A chest x-ray takes a picture of the organs and structures inside the chest, including the heart, lungs, and blood vessels. This test can show several things, including, whether the heart is enlarges; whether fluid is building up in the lungs; and whether pacemaker / defibrillator leads are still in place. This should also be done in MAY 2016.  Your physician wants you to follow-up in: MAY 2016 with Dr Burt Knack.  You will receive a reminder letter in the mail two months in advance. If you don't receive a letter, please call our office to schedule the follow-up appointment.

## 2014-09-07 NOTE — Progress Notes (Signed)
Background: The patient is followed for atrial fibrillation. She has been anticoagulated with Eliquis. She underwent cardioversion after hospital admission for atrial fibrillation with RVR. She reverted back to atrial fibrillation and was placed on amiodarone. A second cardioversion was successful and she has maintained sinus rhythm since June 2015. She is also followed for hypertension and diabetes.  HPI:  80 year old woman presenting for follow-up evaluation. From a cardiac perspective she has done well over the last few months. She denies chest pain, chest pressure, shortness of breath, or heart palpitations. She denies any bleeding problems and seems to be tolerating anticoagulation. No stroke or TIA symptoms.  Studies:  Echo 03/2014: Study Conclusions  - Left ventricle: Systolic function was normal. The estimated ejection fraction was in the range of 55% to 60%. - Mitral valve: There was mild regurgitation. - Left atrium: The atrium was dilated. No evidence of thrombus in the atrial cavity or appendage. 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 Mild MR Moderate LAE. 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 Mild MR Moderate LAE. 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 amiodarone (PACERONE) 200 MG tablet Take 1 tablet (200 mg total) by mouth daily.  Marland Kitchen apixaban (ELIQUIS) 5 MG TABS tablet Take 1 tablet (5 mg total) by mouth 2 (two) times daily.  . bimatoprost (LUMIGAN) 0.01 % SOLN Apply to eye. Eye drop  . Calcium-Phosphorus-Vitamin D (CITRACAL +D3 PO) Take 1 tablet by mouth every evening.  . Cyanocobalamin (B-12 PO) Take 1 tablet by mouth daily.  Marland Kitchen diltiazem (CARDIZEM CD) 240 MG 24 hr capsule Take 240 mg by mouth daily. (CARTIA)  . FeFum-FePo-FA-B Cmp-C-Zn-Mn-Cu (TANDEM PLUS) 162-115.2-1 MG CAPS Take 1  tablet by mouth daily as needed (only as needed).  . fluticasone (FLONASE) 50 MCG/ACT nasal spray Place 1 spray into both nostrils daily.  . lansoprazole (PREVACID) 30 MG capsule Take 30 mg by mouth every morning.  Marland Kitchen levothyroxine (SYNTHROID, LEVOTHROID) 150 MCG tablet Take 150 mcg by mouth daily before breakfast.  . LORazepam (ATIVAN) 1 MG tablet Take 1.5 mg by mouth at bedtime.  Marland Kitchen losartan-hydrochlorothiazide (HYZAAR) 50-12.5 MG per tablet Take 1 tablet by mouth 2 (two) times daily.  . magnesium oxide (MAG-OX) 400 MG tablet Take 400 mg by mouth 2 (two) times daily.  . metFORMIN (GLUMETZA) 500 MG (MOD) 24 hr tablet Take 500-750 mg by mouth 2 (two) times daily with a meal. 750mg  in am, 500mg  in pm  . multivitamin-lutein (OCUVITE-LUTEIN) CAPS capsule Take 1 capsule by mouth daily.  . potassium chloride SA (KLOR-CON M20) 20 MEQ tablet Take 2 tablets now; then 1 tablet daily  . rosuvastatin (CRESTOR) 20 MG tablet Take 10 mg by mouth at bedtime.  . sertraline (ZOLOFT) 50 MG tablet Take 50 mg by mouth daily.  . [DISCONTINUED] amiodarone (PACERONE) 200 MG tablet Take 1 tablet (200 mg total) by mouth daily.    Allergies  Allergen Reactions  . Penicillins Itching and Rash    Past Medical History  Diagnosis Date  . Hypothyroidism, postsurgical   . Hypertension   . Glaucoma   . Cervical cancer   . Breast cancer     s/p right breast lumpectomy  . Thyroid cancer     s/p thyroidectomy  . Atrial fibrillation   . Hx of transesophageal echocardiography (TEE) for monitoring  TEE (03/2014): No LAA clot, moderate LAE, core triatriatum type structure in RA with no stenosis, normal EF 55-60%, mild MR  . Type II diabetes mellitus   . OSA on CPAP   . H/O hiatal hernia   . GERD (gastroesophageal reflux disease)   . Migraine     "last one was years ago" (03/31/2014)  . DJD (degenerative joint disease) of knee   . Arthritis     "all my joints" (03/31/2014)    family history includes Diabetes in her  brother, brother, brother, brother, brother, brother, brother, sister, sister, and sister; Heart attack in her brother; Heart disease in her brother, brother, brother, brother, brother, brother, and sister; Pancreatic cancer in her sister; Prostate cancer in her father.   ROS: Positive for constipation. Otherwise negative except as per HPI  BP 138/78  Pulse 73  Ht 5' 5.5" (1.664 m)  Wt 194 lb 12.8 oz (88.361 kg)  BMI 31.91 kg/m2  PHYSICAL EXAM: Pt is alert and oriented, pleasant elderly woman in NAD HEENT: normal Neck: JVP - normal, carotids 2+= without bruits Lungs: CTA bilaterally CV: RRR grade 2/6 systolic murmur at the left sternal border Abd: soft, NT, Positive BS, no hepatomegaly Ext: no C/C/E, distal pulses intact and equal Skin: warm/dry no rash  ASSESSMENT AND PLAN: 1. Paroxysmal atrial fibrillation. The patient is maintaining sinus rhythm on amiodarone. I recommended that she stop diltiazem as this may be contributing to her constipation which has become a big problem for her. She will call if any problems with heart palpitations. She will have amiodarone monitoring lab work and a chest x-ray before her next office visit. She was encouraged to have annual eye exams.  2. Essential hypertension. Blood pressure is well controlled on current medical program.  Sherren Mocha, MD 09/07/2014 3:53 PM

## 2014-09-10 ENCOUNTER — Ambulatory Visit: Payer: Medicare Other | Admitting: Cardiovascular Disease

## 2014-09-18 ENCOUNTER — Other Ambulatory Visit: Payer: Self-pay | Admitting: Cardiovascular Disease

## 2014-09-18 MED ORDER — APIXABAN 5 MG PO TABS: 5.0000 mg | ORAL_TABLET | Freq: Two times a day (BID) | ORAL | Status: DC

## 2014-09-18 MED ORDER — AMIODARONE HCL 200 MG PO TABS: 200.0000 mg | ORAL_TABLET | Freq: Every day | ORAL | Status: DC

## 2014-09-19 ENCOUNTER — Other Ambulatory Visit: Payer: Self-pay | Admitting: Cardiovascular Disease

## 2014-10-07 ENCOUNTER — Other Ambulatory Visit: Payer: Self-pay | Admitting: *Deleted

## 2014-10-07 MED ORDER — AMIODARONE HCL 200 MG PO TABS: 200.0000 mg | ORAL_TABLET | Freq: Every day | ORAL | Status: DC

## 2014-10-07 MED ORDER — POTASSIUM CHLORIDE CRYS ER 20 MEQ PO TBCR: EXTENDED_RELEASE_TABLET | ORAL | Status: DC

## 2014-10-12 ENCOUNTER — Other Ambulatory Visit: Payer: Self-pay

## 2014-10-12 MED ORDER — AMIODARONE HCL 200 MG PO TABS: 200.0000 mg | ORAL_TABLET | Freq: Every day | ORAL | Status: DC

## 2014-10-12 MED ORDER — POTASSIUM CHLORIDE CRYS ER 20 MEQ PO TBCR: EXTENDED_RELEASE_TABLET | ORAL | Status: DC

## 2014-10-21 ENCOUNTER — Ambulatory Visit: Payer: Medicare Other | Attending: Internal Medicine

## 2014-10-21 DIAGNOSIS — R296 Repeated falls: Secondary | ICD-10-CM | POA: Diagnosis not present

## 2014-10-21 DIAGNOSIS — R269 Unspecified abnormalities of gait and mobility: Secondary | ICD-10-CM | POA: Diagnosis present

## 2014-10-21 NOTE — Therapy (Signed)
[facility] [address] [place], [place], [address] Phone: [phone]   Fax:  [phone]  Physical Therapy Evaluation  Patient Details  Name: [name] MRN: [id number] Date of Birth: 1931/06/03  Encounter Date: 10/21/2014      PT End of Session - 10/21/14 1308    Visit Number 1   Number of Visits 9   Date for PT Re-Evaluation 11/20/14   Authorization Type Requires G-code every 10th visit.   PT Start Time 1152   PT Stop Time 1235   PT Time Calculation (min) 43 min   Equipment Utilized During Treatment Gait belt   Activity Tolerance Patient tolerated treatment well   Behavior During Therapy WFL for tasks assessed/performed      Past Medical History  Diagnosis Date  . Hypothyroidism, postsurgical   . Hypertension   . Glaucoma   . Cervical cancer   . Breast cancer     s/p right breast lumpectomy  . Thyroid cancer     s/p thyroidectomy  . Atrial fibrillation   . 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 Type II diabetes mellitus   . OSA on CPAP   . H/O hiatal hernia   . GERD (gastroesophageal reflux disease)   . Migraine     "last one was years ago" (03/31/2014)  . DJD (degenerative joint disease) of knee   . Arthritis     "all my joints" (03/31/2014)    Past Surgical History  Procedure Laterality Date  . Thyroidectomy    . Tee without cardioversion N/A 03/30/2014    Procedure: TRANSESOPHAGEAL ECHOCARDIOGRAM (TEE);  Surgeon: [name], MD;  Location: [place];  Service: Cardiovascular;  Laterality: N/A;  . Cardioversion N/A 03/30/2014    Procedure: CARDIOVERSION - BEDSIDE;  Surgeon: [name], MD;  Location: [facility];  Service: Cardiovascular;  Laterality: N/A;  . Tonsillectomy    . Excisional hemorrhoidectomy    . Carpal tunnel release Right   .  Abdominal hysterectomy      "partial"  . Cataract extraction w/ intraocular lens  implant, bilateral Bilateral   . Breast biopsy Right   . Breast lumpectomy Right   . Cardioversion N/A 03/31/2014    Procedure: CARDIOVERSION  (BEDSIDE) ;  Surgeon: Lelon Perla, MD;  Location: [place];  Service: Cardiovascular;  Laterality: N/A;  . Cardioversion N/A 04/27/2014    Procedure: CARDIOVERSION;  Surgeon: Darlin Coco, MD;  Location: [facility] END[facility];  Service: Cardiovascular;  Laterality: N/A;    There were no vitals taken for this visit.  Visit Diagnosis:  Abnormality of gait - Plan: PT plan of care cert/re-cert  Frequent falls - Plan: PT plan of care cert/re-cert      Subjective Assessment - 10/21/14 1306    Symptoms frequent falls during ambulation, difficulty walking longer distances, impaired balance   Patient Stated Goals To walk better, not fall when walking, build strength and endurance so she can be active (pt used to ride her bicycle around her neighborhood)   Currently in Pain? No/denies          [facility] PT Assessment - 10/21/14 1200    Precautions   Precautions Fall   Restrictions   Weight Bearing Restrictions No   Balance Screen   Has the patient fallen in the past 6 months Yes  How many times? 6   Has the patient had a decrease in activity level because of a fear of falling?  Yes   Is the patient reluctant to leave their home because of a fear of falling?  No   Home Environment   Living Enviornment Private residence   Living Arrangements Alone   Available Help at Discharge Family   Type of [place] to enter   Entrance Stairs-Number of Steps 2-3   [place] One level   [place] - single point;Walker - standard;Shower seat   Prior Function   Level of Independence Independent with basic ADLs;Independent with homemaking with ambulation;Independent with gait;Independent with transfers   Cognition    Overall Cognitive Status Within Functional Limits for tasks assessed   Sensation   Light Touch Appears Intact   Coordination   Gross Motor Movements are Fluid and Coordinated Yes   Fine Motor Movements are Fluid and Coordinated Yes   Posture/Postural Control   Posture/Postural Control Postural limitations   Postural Limitations Rounded Shoulders;Forward head;Posterior pelvic tilt   AROM   Overall AROM  Within functional limits for tasks performed   Strength   Overall Strength Deficits   Overall Strength Comments B LE globally 4+/5, except for B hip flexion and B dorsiflexion 4/5.   Transfers   Transfers Sit to Stand;Stand to Sit   Sit to Stand 5: Supervision   Sit to Stand Details (indicate cue type and reason) to ensure safety.   Stand to Sit 5: Supervision   Stand to Sit Details to ensure safety.   Ambulation/Gait   Ambulation/Gait Yes   Ambulation/Gait Assistance 5: Supervision;4: Min guard  min guard during turns   Ambulation/Gait Assistance Details Pt ambulated over even terrain and required seated rest breaks due to fatigue. One LOB episode which pt self corrected with UE support on wall.   Ambulation Distance (Feet) 100 Feet   Assistive device None   Gait Pattern Step-through pattern;Decreased stride length;Decreased dorsiflexion - right;Decreased dorsiflexion - left;Decreased trunk rotation  posterior pelvic tilt, L LE decr. knee ext. control swing   Gait velocity 2.31ft/sec.  10MWT: 11.34sec.   Balance   Balance Assessed Yes   Standardized Balance Assessment   Standardized Balance Assessment Berg Balance Test;Timed Up and Go Test   Berg Balance Test   Sit to Stand Able to stand without using hands and stabilize independently   Standing Unsupported Able to stand safely 2 minutes   Sitting with Back Unsupported but Feet Supported on Floor or Stool Able to sit safely and securely 2 minutes   Stand to Sit Sits safely with minimal use of hands   Transfers Able to transfer  safely, minor use of hands   Standing Unsupported with Eyes Closed Able to stand 10 seconds with supervision   Standing Ubsupported with Feet Together Able to place feet together independently and stand for 1 minute with supervision   From Standing, Reach Forward with Outstretched Arm Can reach forward >12 cm safely (5")  8   From Standing Position, Pick up Object from Floor Able to pick up shoe, needs supervision   From Standing Position, Turn to Look Behind Over each Shoulder Turn sideways only but maintains balance   Turn 360 Degrees Able to turn 360 degrees safely but slowly   Standing Unsupported, Alternately Place Feet on Step/Stool Able to complete 4 steps without aid or supervision   Standing Unsupported, One Foot  in [place] to plae foot ahead of the other independently and hold 30 seconds   Standing on One Leg Tries to lift leg/unable to hold 3 seconds but remains standing independently   Total Score 42   Timed Up and Go Test   TUG Normal TUG   Normal TUG (seconds) 10.17   TUG Comments No AD, but unsteady during turns and required min guard to ensure safety.                PT Long Term Goals - 11-12-14 1616    PT LONG TERM GOAL #1   Title Pt will be independent in HEP to improve functional mobilty. Target date: 11/18/14.   Status New   PT LONG TERM GOAL #2   Title Pt will improve BERG balance score to >/=48/56 to decr. falls risk. Target date: 11/18/14.   Status New   PT LONG TERM GOAL #3   Title Pt will ambulate 400' over even/uneven terrain, independently, to improve functional mobility. Target date: 11/18/14.   Status New   PT LONG TERM GOAL #4   Title Pt will report no falls within the last 2 weeks, to improve safety during functional mobilty. Target date: 11/18/14.   Status New   PT LONG TERM GOAL #5   Title Pt will improve ABC score by 10% to improve quality of life. Target date: 11/18/14.   Additional Long Term Goals   Additional Long Term Goals Yes   PT LONG TERM  GOAL #6   Title Pt will verbalize plans to join fitness center to maintain gains made in PT. Target date: 11/18/14.   Status New   PT LONG TERM GOAL #7   Title Pt will improve gait speed to >/=3.19ft/sec. to improve functional mobility. Target date: 11/18/14.   Status New          Plan - 11-12-14 1158    Clinical Impression Statement Pt is a pleasant 80 y/o female presenting to OPPT neuro with c/o of multiple falls and difficulty walking. Pt's daughter, [name], present during session. Pt has experienced 5 falls in the last 3 weeks, pt reported 6 falls in the last six months.  Pt reported difficulty walking and impaired balance began after two hospitalizations in 03/2014 to treat a-fib, she had to decrease her activity level to control HR. Pt is now trying to incr. activity level and has noticed that her balance has gotten progressively worse and she is not able to tolerate exercising or walking long distances.    Pt will benefit from skilled therapeutic intervention in order to improve on the following deficits Abnormal gait;Decreased activity tolerance;Decreased knowledge of use of DME;Decreased balance;Decreased mobility;Decreased strength   Rehab Potential Good   PT Frequency 2x / week   PT Duration 4 weeks   PT Treatment/Interventions ADLs/Self Care Home Management;Gait training;Neuromuscular re-education;Functional mobility training;Patient/family education;Therapeutic activities;Therapeutic exercise;DME Instruction;Balance training   PT Next Visit Plan Provide balance and functional strength HEP.   Consulted and Agree with Plan of Care Patient;Family member/caregiver   Family Member Consulted pt's daughter, [name] - 11-12-14 1622    Functional Assessment Tool Used BERG: 42/56; gait speed without AD: 2.76ft/sec., 5 falls in last 3 weeks   Functional Limitation Mobility: Walking and moving around   Mobility: Walking and Moving Around Current Status [phone]) At least 20  percent but less than 40 percent impaired, limited or restricted   Mobility: Walking and Moving  Around Goal Status [phone]) At least 1 percent but less than 20 percent impaired, limited or restricted                            Problem List Patient Active Problem List   Diagnosis Date Noted  . Post-surgical hypothyroidism 04/09/2014  . Anticoagulated by anticoagulation treatment - Eliquis 03/30/2014  . Diabetes mellitus without complication   . Hypertension   . Atrial fibrillation 03/28/2014  . Thyroid cancer   . Heart murmur   . DJD (degenerative joint disease) of knee   . Glaucoma   . ANEMIA, IRON DEFICIENCY 10/14/2010  . GERD 10/14/2010  . ADENOCARCINOMA, BREAST 10/13/2010  . THYROID CANCER 10/13/2010  . DM 10/13/2010  . HYPERCHOLESTEROLEMIA 10/13/2010  . ANEMIA, UNSPECIFIED 10/13/2010  . ANXIETY 10/13/2010  . MIGRAINE HEADACHE 10/13/2010  . HYPERTENSION 10/13/2010  . EROSIVE ESOPHAGITIS 10/13/2010  . BARRETTS ESOPHAGUS 10/13/2010  . HIATAL HERNIA WITH REFLUX 10/13/2010  . DIVERTICULOSIS, COLON 10/13/2010  . IRRITABLE BOWEL SYNDROME 10/13/2010  . RECTAL FISSURE 10/13/2010  . OSTEOARTHRITIS 10/13/2010  . CARDIAC MURMUR 10/13/2010  . COLONIC POLYPS, ADENOMATOUS, HX OF 10/13/2010    Verma Grothaus L 10/21/2014, 4:25 PM      Geoffry Paradise, PT,DPT 10/21/2014 4:25 PM Phone: [phone] Fax: [phone]

## 2014-10-27 ENCOUNTER — Ambulatory Visit: Payer: Medicare Other

## 2014-10-27 DIAGNOSIS — R296 Repeated falls: Secondary | ICD-10-CM

## 2014-10-27 DIAGNOSIS — R269 Unspecified abnormalities of gait and mobility: Secondary | ICD-10-CM

## 2014-10-27 NOTE — Therapy (Signed)
[facility] [address] [place], [place], [address] Phone: [phone]   Fax:  [phone]  Physical Therapy Treatment  Patient Details  Name: [name] MRN: [id number] Date of Birth: 1930-12-08  Encounter Date: 10/27/2014      PT End of Session - 10/27/14 1452    Visit Number 2   Number of Visits 9   Date for PT Re-Evaluation 11/20/14   Authorization Type Requires G-code every 10th visit.   PT Start Time 1104   PT Stop Time 1145   PT Time Calculation (min) 41 min   Equipment Utilized During Treatment Gait belt   Activity Tolerance Patient tolerated treatment well   Behavior During Therapy WFL for tasks assessed/performed      Past Medical History  Diagnosis Date  . Hypothyroidism, postsurgical   . Hypertension   . Glaucoma   . Cervical cancer   . Breast cancer     s/p right breast lumpectomy  . Thyroid cancer     s/p thyroidectomy  . Atrial fibrillation   . 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 Type II diabetes mellitus   . OSA on CPAP   . H/O hiatal hernia   . GERD (gastroesophageal reflux disease)   . Migraine     "last one was years ago" (03/31/2014)  . DJD (degenerative joint disease) of knee   . Arthritis     "all my joints" (03/31/2014)    Past Surgical History  Procedure Laterality Date  . Thyroidectomy    . Tee without cardioversion N/A 03/30/2014    Procedure: TRANSESOPHAGEAL ECHOCARDIOGRAM (TEE);  Surgeon: [name], MD;  Location: [facility];  Service: Cardiovascular;  Laterality: N/A;  . Cardioversion N/A 03/30/2014    Procedure: CARDIOVERSION - BEDSIDE;  Surgeon: [name], MD;  Location: [facility];  Service: Cardiovascular;  Laterality: N/A;  . Tonsillectomy    . Excisional hemorrhoidectomy    . Carpal tunnel release Right   .  Abdominal hysterectomy      "partial"  . Cataract extraction w/ intraocular lens  implant, bilateral Bilateral   . Breast biopsy Right   . Breast lumpectomy Right   . Cardioversion N/A 03/31/2014    Procedure: CARDIOVERSION  (BEDSIDE) ;  Surgeon: Lelon Perla, MD;  Location: [place];  Service: Cardiovascular;  Laterality: N/A;  . Cardioversion N/A 04/27/2014    Procedure: CARDIOVERSION;  Surgeon: Darlin Coco, MD;  Location: [facility] ENDOSCOPY;  Service: Cardiovascular;  Laterality: N/A;    There were no vitals taken for this visit.  Visit Diagnosis:  Abnormality of gait  Frequent falls      Subjective Assessment - 10/27/14 1105    Symptoms Pt reported she walked 1.5 miles the day after PT eval and has walked every day since last visit. Pt denied falls or changes since last visit.   Patient Stated Goals To walk better, not fall when walking, build strength and endurance so she can be active (pt used to ride her bicycle around her neighborhood)   Currently in Pain? No/denies            [facility] Adult PT Treatment/Exercise - 10/27/14 1444    Transfers   Transfers Sit to Stand;Stand to Sit   Sit to Stand 5: Supervision   Sit to Stand Details (indicate cue type and  reason) 2x10 from lower and higher seated surfaces. VC's to improve ant. weight shifting and to keep ankles behind knees.   Stand to Sit 5: Supervision   Stand to Sit Details 2x10. VC's to improve eccentric control.   Ambulation/Gait   Ambulation/Gait --   Ambulation/Gait Assistance --   Balance   Balance Assessed Yes   Static Standing Balance   Static Standing - Balance Support No upper extremity supported   Static Standing - Level of Assistance 5: Stand by assistance;4: Min assist;Other (comment)  min guard   Static Standing - Comment/# of Minutes Balance activities performed in the corner with chair in front of pt for safety with B LE, 10-30 second holds, 2-3 reps/activity, on compliant and non-compliant surfaces:  feet apart/together with eyes open/closed, with and without head turns, tandem stance, single leg stance. VC's to for technique and to improve weight shifting. 2 LOB episodes corrected with min A and 3 LOB episodes self corrected with UE support.   Dynamic Standing Balance   Dynamic Standing - Balance Support Bilateral upper extremity supported;Right upper extremity supported   Dynamic Standing - Level of Assistance Other (comment);4: Min assist  min guard   Dynamic Standing - Balance Activities Forward lean/weight shifting  marching   Dynamic Standing - Comments Pt required min A and one HHA to maintain balance during marching ambulation (4x10'), min guard during ant/post.(x20) weight shifting with B UE support at counter.   Exercises   Exercises Knee/Hip   Knee/Hip Exercises: Sidelying   Clams 3x10 with B LE and red theraband proximal to knees. VC's and tactile cues for technique, not to roll hips back, and to take rest breaks in between sets.          PT Education - 10/27/14 1452    Education provided Yes   Education Details HEP   Person(s) Educated Patient;Child(ren)   Methods Explanation;Demonstration;Tactile cues;Verbal cues;Handout   Comprehension Verbalized understanding;Returned demonstration            PT Long Term Goals - 10/27/14 1454    PT LONG TERM GOAL #1   Title Pt will be independent in HEP to improve functional mobilty. Target date: 11/18/14.   Status On-going   PT LONG TERM GOAL #2   Title Pt will improve BERG balance score to >/=48/56 to decr. falls risk. Target date: 11/18/14.   Status On-going   PT LONG TERM GOAL #3   Title Pt will ambulate 400' over even/uneven terrain, independently, to improve functional mobility. Target date: 11/18/14.   Status On-going   PT LONG TERM GOAL #4   Title Pt will report no falls within the last 2 weeks, to improve safety during functional mobilty. Target date: 11/18/14.   Status On-going   PT LONG TERM GOAL #5   Title Pt will  improve ABC score by 10% to improve quality of life. Target date: 11/18/14.   Status On-going   PT LONG TERM GOAL #6   Title Pt will verbalize plans to join fitness center to maintain gains made in PT. Target date: 11/18/14.   Status On-going   PT LONG TERM GOAL #7   Title Pt will improve gait speed to >/=3.69ft/sec. to improve functional mobility. Target date: 11/18/14.   Status On-going          Plan - 10/27/14 1452    Clinical Impression Statement Pt tolerated HEP well, and easily corrected technique with minimal cues. Pt continues to experience LOB during balance activities and would benefit  from skilled PT to improve safety during funcitonal mobility.   Pt will benefit from skilled therapeutic intervention in order to improve on the following deficits Abnormal gait;Decreased activity tolerance;Decreased knowledge of use of DME;Decreased balance;Decreased mobility;Decreased strength   Rehab Potential Good   PT Frequency 2x / week   PT Treatment/Interventions ADLs/Self Care Home Management;Gait training;Neuromuscular re-education;Functional mobility training;Patient/family education;Therapeutic activities;Therapeutic exercise;DME Instruction;Balance training   PT Next Visit Plan Dynamic gait/balance training, incorporate compliant surfaces if appropriate.   Consulted and Agree with Plan of Care Patient;Family member/caregiver   Family Member Consulted pt's daughter, Shirlean Mylar                               Problem List Patient Active Problem List   Diagnosis Date Noted  . Post-surgical hypothyroidism 04/09/2014  . Anticoagulated by anticoagulation treatment - Eliquis 03/30/2014  . Diabetes mellitus without complication   . Hypertension   . Atrial fibrillation 03/28/2014  . Thyroid cancer   . Heart murmur   . DJD (degenerative joint disease) of knee   . Glaucoma   . ANEMIA, IRON DEFICIENCY 10/14/2010  . GERD 10/14/2010  . ADENOCARCINOMA, BREAST 10/13/2010  .  THYROID CANCER 10/13/2010  . DM 10/13/2010  . HYPERCHOLESTEROLEMIA 10/13/2010  . ANEMIA, UNSPECIFIED 10/13/2010  . ANXIETY 10/13/2010  . MIGRAINE HEADACHE 10/13/2010  . HYPERTENSION 10/13/2010  . EROSIVE ESOPHAGITIS 10/13/2010  . BARRETTS ESOPHAGUS 10/13/2010  . HIATAL HERNIA WITH REFLUX 10/13/2010  . DIVERTICULOSIS, COLON 10/13/2010  . IRRITABLE BOWEL SYNDROME 10/13/2010  . RECTAL FISSURE 10/13/2010  . OSTEOARTHRITIS 10/13/2010  . CARDIAC MURMUR 10/13/2010  . COLONIC POLYPS, ADENOMATOUS, HX OF 10/13/2010    Christion Leonhard L 10/27/2014, 2:55 PM     Geoffry Paradise, PT,DPT 10/27/2014 2:55 PM Phone: [phone] Fax: [phone]

## 2014-10-27 NOTE — Patient Instructions (Signed)
Perform balance activities in the corner with a chair in front of you for safety.  Feet Apart (Compliant Surface) Head Motion - Eyes Open   With eyes open, standing on compliant surface: ___pillow_____, feet shoulder width apart, move head slowly: up and down, side to side, and diagonally (corner to corner) for 30 seconds. Repeat _3___ times per session. Do __1__ sessions per day.  Copyright  VHI. All rights reserved.  Feet Heel-Toe "Tandem", Varied Arm Positions - Eyes Open   With eyes open, right foot directly in front of the other, arms at your side, look straight ahead at a stationary object. Hold _10-30___ seconds. Repeat with left foot in front. Repeat __3__ times per session. Do __1__ sessions per day.  Copyright  VHI. All rights reserved.  Feet Together, Varied Arm Positions - Eyes Closed   Stand with feet together and arms at your side. Close eyes and visualize upright position. Hold _30___ seconds. Repeat __3__ times per session. Do _1___ sessions per day.  Copyright  VHI. All rights reserved.  Single Leg - Eyes Open   Holding support, lift right leg while maintaining balance over other leg. Progress to removing hands from support surface for longer periods of time. Repeat while lifting left leg. Hold_10-30___ seconds. Repeat __3__ times per session. Do _1___ sessions per day.  Copyright  VHI. All rights reserved.  Weight Shift: Anterior / Posterior (Limits of Stability)   Slowly shift weight backward until toes begin to rise off floor. Return to starting position. Shift weight slowly forward until heels begin to rise off floor. Hold each position _2___ seconds. Repeat _20___ times per session. Do _1___ sessions per day.  Functional Quadriceps: Sit to Stand   Sit on edge of chair, feet flat on floor. Stand upright, extending knees fully. Repeat _10___ times per set. Do _1___ sets per session. Do __2__ sessions per day.  http://orth.exer.us/735   "I love a  Engineer, building services onto counter with one hand, march down the counter 10 times, while marching knees up towards the ceiling. Repeat _4___ times. Do __1__ sessions per day.  http://gt2.exer.us/345   Copyright  VHI. All rights reserved.   Abduction: Clam (Eccentric) - Side-Lying   Lie on side with knees bent, with red band tied around legs above your knees. Lift top knee, keeping feet together and hold for 2 seconds. Keep trunk steady. Slowly lower and repeat. _10__ reps per set, _3__ sets per day, __4_ days per week. Repeat with other leg.   Copyright  VHI. All rights reserved.

## 2014-10-29 ENCOUNTER — Encounter: Payer: Medicare Other | Admitting: Physical Therapy

## 2014-11-03 ENCOUNTER — Ambulatory Visit: Payer: Medicare Other

## 2014-11-03 DIAGNOSIS — R269 Unspecified abnormalities of gait and mobility: Secondary | ICD-10-CM | POA: Diagnosis not present

## 2014-11-03 DIAGNOSIS — R296 Repeated falls: Secondary | ICD-10-CM

## 2014-11-03 NOTE — Therapy (Signed)
[facility] [address] [place], [place], [address] Phone: [phone]   Fax:  [phone]  Physical Therapy Treatment  Patient Details  Name: [name] MRN: [id number] Date of Birth: Apr 06, 1931  Encounter Date: 11/03/2014      PT End of Session - 11/03/14 1622    Visit Number 3   Number of Visits 9   Date for PT Re-Evaluation 11/20/14   Authorization Type Requires G-code every 10th visit.   PT Start Time 1532   PT Stop Time 1613   PT Time Calculation (min) 41 min   Equipment Utilized During Treatment Gait belt   Activity Tolerance Patient tolerated treatment well   Behavior During Therapy WFL for tasks assessed/performed      Past Medical History  Diagnosis Date  . Hypothyroidism, postsurgical   . Hypertension   . Glaucoma   . Cervical cancer   . Breast cancer     s/p right breast lumpectomy  . Thyroid cancer     s/p thyroidectomy  . Atrial fibrillation   . 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 Type II diabetes mellitus   . OSA on CPAP   . H/O hiatal hernia   . GERD (gastroesophageal reflux disease)   . Migraine     "last one was years ago" (03/31/2014)  . DJD (degenerative joint disease) of knee   . Arthritis     "all my joints" (03/31/2014)    Past Surgical History  Procedure Laterality Date  . Thyroidectomy    . Tee without cardioversion N/A 03/30/2014    Procedure: TRANSESOPHAGEAL ECHOCARDIOGRAM (TEE);  Surgeon: [name], MD;  Location: [facility];  Service: Cardiovascular;  Laterality: N/A;  . Cardioversion N/A 03/30/2014    Procedure: CARDIOVERSION - BEDSIDE;  Surgeon: [name], MD;  Location: [facility];  Service: Cardiovascular;  Laterality: N/A;  . Tonsillectomy    . Excisional hemorrhoidectomy    . Carpal tunnel release  Right   . Abdominal hysterectomy      "partial"  . Cataract extraction w/ intraocular lens  implant, bilateral Bilateral   . Breast biopsy Right   . Breast lumpectomy Right   . Cardioversion N/A 03/31/2014    Procedure: CARDIOVERSION  (BEDSIDE) ;  Surgeon: Lelon Perla, MD;  Location: [facility];  Service: Cardiovascular;  Laterality: N/A;  . Cardioversion N/A 04/27/2014    Procedure: CARDIOVERSION;  Surgeon: Darlin Coco, MD;  Location: [facility] ENDOSCOPY;  Service: Cardiovascular;  Laterality: N/A;    There were no vitals taken for this visit.  Visit Diagnosis:  Abnormality of gait  Frequent falls      Subjective Assessment - 11/03/14 1534    Symptoms Pt denied falls or changes since last visit. Pt reported she's been performing HEP as prescribed.   Patient Stated Goals To walk better, not fall when walking, build strength and endurance so she can be active (pt used to ride her bicycle around her neighborhood)   Currently in Pain? No/denies                    [facility] Adult PT Treatment/Exercise - 11/03/14 1534    Ambulation/Gait   Ambulation/Gait Yes   Ambulation/Gait Assistance 5: Supervision   Ambulation/Gait Assistance Details Pt ambulated over even terrain while performing head turns and looking  for playing cards, pt missed 12 cards and then 11 cards. Pt ambulated over compliant surface (ant/lat/post. directions 4x7'). VC's to improve stride length and heel strike, especially during head turns and while ambulating backwards.   Ambulation Distance (Feet) --  350', 115', 230'   Assistive device None   Gait Pattern Step-through pattern;Decreased stride length;Decreased dorsiflexion - right;Decreased dorsiflexion - left;Decreased trunk rotation   Balance   Balance Assessed Yes   Dynamic Standing Balance   Dynamic Standing - Balance Support No upper extremity supported   Dynamic Standing - Level of Assistance 4: Min assist;Other (comment)  min guard   Dynamic Standing -  Balance Activities Other (comment);Step ups  braiding    Dynamic Standing - Comments B LE: Cone taps (single and double taps, and knocking cones over and upright cones), 2x5cones/LE/activity. Pt performed braiding at counter with 1-2 UE and no UE with min guard, pt then performed braiding over compliant surface 4x7'. Pt required min A during 4 LOB episodes. VC's to improve weight shifting.  Pt performed step-ups with B LE x10/LE.                      PT Long Term Goals - 10/27/14 1454    PT LONG TERM GOAL #1   Title Pt will be independent in HEP to improve functional mobilty. Target date: 11/18/14.   Status On-going   PT LONG TERM GOAL #2   Title Pt will improve BERG balance score to >/=48/56 to decr. falls risk. Target date: 11/18/14.   Status On-going   PT LONG TERM GOAL #3   Title Pt will ambulate 400' over even/uneven terrain, independently, to improve functional mobility. Target date: 11/18/14.   Status On-going   PT LONG TERM GOAL #4   Title Pt will report no falls within the last 2 weeks, to improve safety during functional mobilty. Target date: 11/18/14.   Status On-going   PT LONG TERM GOAL #5   Title Pt will improve ABC score by 10% to improve quality of life. Target date: 11/18/14.   Status On-going   PT LONG TERM GOAL #6   Title Pt will verbalize plans to join fitness center to maintain gains made in PT. Target date: 11/18/14.   Status On-going   PT LONG TERM GOAL #7   Title Pt will improve gait speed to >/=3.3ft/sec. to improve functional mobility. Target date: 11/18/14.   Status On-going               Plan - 11/03/14 1622    Clinical Impression Statement Pt demonstrated progress as she was able to perform dynamic balance activities over compliant and non-compliant surfaces. Pt did require min A to correct balance during 4 LOB episodes, but was otherwise min guard. Continue with POC.   Pt will benefit from skilled therapeutic intervention in order to improve on the  following deficits Abnormal gait;Decreased activity tolerance;Decreased knowledge of use of DME;Decreased balance;Decreased mobility;Decreased strength   Rehab Potential Good   PT Frequency 2x / week   PT Duration 4 weeks   PT Treatment/Interventions ADLs/Self Care Home Management;Gait training;Neuromuscular re-education;Functional mobility training;Patient/family education;Therapeutic activities;Therapeutic exercise;DME Instruction;Balance training   PT Next Visit Plan Dynamic gait/balance training over even/uneven terrain, incorporate pertubations.   Consulted and Agree with Plan of Care Patient   Family Member Consulted pt's daughter, Shirlean Mylar        Problem List Patient Active Problem List   Diagnosis Date Noted  . Post-surgical hypothyroidism 04/09/2014  .  Anticoagulated by anticoagulation treatment - Eliquis 03/30/2014  . Diabetes mellitus without complication   . Hypertension   . Atrial fibrillation 03/28/2014  . Thyroid cancer   . Heart murmur   . DJD (degenerative joint disease) of knee   . Glaucoma   . ANEMIA, IRON DEFICIENCY 10/14/2010  . GERD 10/14/2010  . ADENOCARCINOMA, BREAST 10/13/2010  . THYROID CANCER 10/13/2010  . DM 10/13/2010  . HYPERCHOLESTEROLEMIA 10/13/2010  . ANEMIA, UNSPECIFIED 10/13/2010  . ANXIETY 10/13/2010  . MIGRAINE HEADACHE 10/13/2010  . HYPERTENSION 10/13/2010  . EROSIVE ESOPHAGITIS 10/13/2010  . BARRETTS ESOPHAGUS 10/13/2010  . HIATAL HERNIA WITH REFLUX 10/13/2010  . DIVERTICULOSIS, COLON 10/13/2010  . IRRITABLE BOWEL SYNDROME 10/13/2010  . RECTAL FISSURE 10/13/2010  . OSTEOARTHRITIS 10/13/2010  . CARDIAC MURMUR 10/13/2010  . COLONIC POLYPS, ADENOMATOUS, HX OF 10/13/2010    Yareni Creps L 11/03/2014, 4:25 PM  [facility] [address] [place], [place], [address] Phone: [phone]   Fax:  [phone]     Geoffry Paradise, PT,DPT 11/03/2014 4:25 PM Phone:  [phone] Fax: [phone]

## 2014-11-10 ENCOUNTER — Ambulatory Visit: Payer: Medicare Other

## 2014-11-12 ENCOUNTER — Ambulatory Visit: Payer: Medicare Other

## 2014-11-19 NOTE — Therapy (Signed)
[facility] [address] [place], [place], [address] Phone: [phone]   Fax:  [phone]  Patient Details  Name: [name] MRN: [id number] Date of Birth: 1931-09-12 Referring Provider:  No ref. provider found  Encounter Date: 11/19/2014   PHYSICAL THERAPY DISCHARGE SUMMARY  Visits from Start of Care: 3  Current functional level related to goals / functional outcomes: PT LONG TERM GOAL #1     Title  Pt will be independent in HEP to improve functional mobilty. Target date: 11/18/14.    Status  On-going    PT LONG TERM GOAL #2    Title  Pt will improve BERG balance score to >/=48/56 to decr. falls risk. Target date: 11/18/14.    Status  On-going    PT LONG TERM GOAL #3    Title  Pt will ambulate 400' over even/uneven terrain, independently, to improve functional mobility. Target date: 11/18/14.    Status  On-going    PT LONG TERM GOAL #4    Title  Pt will report no falls within the last 2 weeks, to improve safety during functional mobilty. Target date: 11/18/14.    Status  On-going    PT LONG TERM GOAL #5    Title  Pt will improve ABC score by 10% to improve quality of life. Target date: 11/18/14.    Status  On-going    PT LONG TERM GOAL #6    Title  Pt will verbalize plans to join fitness center to maintain gains made in PT. Target date: 1/6/ 16.    Status  On-going    PT LONG TERM GOAL #7    Title  Pt will improve gait speed to >/=3.80f/sec. to improve functional mobility. Target date: 1/6/ 16.    Status  On-going       Remaining deficits: Unknown, as pt cancelled remaining PT appointments due to illness. PT spoke with pt on 11/19/14 and pt reported she will not be returning to PT due to illness, but that PT really helped and she'll continue to perform HEP.   Education / Equipment: HEP  Plan: Patient agrees to discharge.  Patient goals were not met. Patient is  being discharged due to the patient's request.  ?????       Pt ceased therapy due to illness, so it is unknown if goals are met.    Shiann Kam L 11/19/2014, 1:52 PM  [facility] [address]Phone: [phone]  Fax:  [phone]  JGeoffry Paradise PT,DPT 11/19/2014 1:54 PM Phone: [phone]: [phone]

## 2015-02-05 ENCOUNTER — Telehealth: Payer: Self-pay | Admitting: Cardiovascular Disease

## 2015-02-05 NOTE — Telephone Encounter (Signed)
New problem   Pt is having gum surgery and want to know if she can take antibiotics with Eliquis. Please advise

## 2015-02-05 NOTE — Telephone Encounter (Signed)
Advised that may take anitibiotics with Eliquis per Ronnald Collum, pharmacist.

## 2015-02-10 ENCOUNTER — Other Ambulatory Visit: Payer: Self-pay | Admitting: Cardiovascular Disease

## 2015-03-31 ENCOUNTER — Telehealth: Payer: Self-pay | Admitting: Cardiovascular Disease

## 2015-03-31 ENCOUNTER — Ambulatory Visit (INDEPENDENT_AMBULATORY_CARE_PROVIDER_SITE_OTHER): Payer: Medicare Other | Admitting: Cardiology

## 2015-03-31 ENCOUNTER — Encounter: Payer: Self-pay | Admitting: Cardiology

## 2015-03-31 ENCOUNTER — Telehealth: Payer: Self-pay | Admitting: *Deleted

## 2015-03-31 ENCOUNTER — Ambulatory Visit
Admission: RE | Admit: 2015-03-31 | Discharge: 2015-03-31 | Disposition: A | Discharge status: Home / Self Care | Payer: Medicare Other | Source: Ambulatory Visit | Attending: Cardiology | Admitting: Cardiology

## 2015-03-31 VITALS — BP 115/64 | HR 110 | Ht 65.5 in | Wt 198.8 lb

## 2015-03-31 DIAGNOSIS — I4891 Unspecified atrial fibrillation: Secondary | ICD-10-CM

## 2015-03-31 DIAGNOSIS — I10 Essential (primary) hypertension: Secondary | ICD-10-CM

## 2015-03-31 DIAGNOSIS — R0602 Shortness of breath: Secondary | ICD-10-CM

## 2015-03-31 HISTORY — DX: Unspecified atrial fibrillation: I48.91

## 2015-03-31 LAB — HEPATIC FUNCTION PANEL
ALT: 17 U/L (ref 0–35)
AST: 20 U/L (ref 0–37)
Albumin: 4.1 g/dL (ref 3.5–5.2)
Alkaline Phosphatase: 53 U/L (ref 39–117)
Bilirubin, Direct: 0.1 mg/dL (ref 0.0–0.3)
Total Bilirubin: 0.4 mg/dL (ref 0.2–1.2)
Total Protein: 7 g/dL (ref 6.0–8.3)

## 2015-03-31 LAB — TSH: TSH: 2.11 u[IU]/mL (ref 0.35–4.50)

## 2015-03-31 MED ORDER — DILTIAZEM HCL ER COATED BEADS 240 MG PO CP24: 240.0000 mg | ORAL_CAPSULE | Freq: Every day | ORAL | Status: DC

## 2015-03-31 MED ORDER — AMIODARONE HCL 200 MG PO TABS: 200.0000 mg | ORAL_TABLET | Freq: Two times a day (BID) | ORAL | Status: DC

## 2015-03-31 MED ORDER — LOSARTAN POTASSIUM-HCTZ 50-12.5 MG PO TABS: 1.0000 | ORAL_TABLET | Freq: Two times a day (BID) | ORAL | Status: DC

## 2015-03-31 NOTE — Assessment & Plan Note (Signed)
One dose held 3 weeks ago for gum surgery

## 2015-03-31 NOTE — Telephone Encounter (Signed)
New Message  Pt feels like she is in A-fib (pulse going from the 60's to 124). Pt is obviously SoB on the phone.   Pt c/o Shortness Of Breath: STAT if SOB developed within the last 24 hours or pt is noticeably SOB on the phone  1. Are you currently SOB (can you hear that pt is SOB on the phone)? yes  2. How long have you been experiencing SOB? Few days  3. Are you SOB when sitting or when up moving around? both  4. Are you currently experiencing any other symptoms? sweating

## 2015-03-31 NOTE — Telephone Encounter (Signed)
Patient reports that she had a sudden onset on fast/irregular heart rate last night around 6pm. She became SOB and diaphoretic also. BP 140/88 to 90/63 and HR 60 (regular) to 124 (irregular). Thinks she may be back in AFib. She took an extra Amiodarone and managed to get some sleep. Upon waking this AM, she is still experiencing HR 124 (strong and irregular), BP 123/106, SOB and intermittent diaphoresis. She is due to see [name] on 5/27 for routine appointment and she is due to come in this week for labs and CXR.  Reviewed by Dr. Radford Pax. Patient scheduled to come in today at 2:30 pm to be seen in appointment by Kerin Ransom, PA. Patient notified and verbalized appreciation for the work-in appointment today.

## 2015-03-31 NOTE — Patient Instructions (Addendum)
Medication Instructions:  Your physician has recommended you make the following change in your medication:   1- Increase Amiodarone 200 mg by mouth twice daily 2- Decrease Losartan -hydrochlorothiazide 50-12.5 mg by mouth twice daily 3- Start Diltiazem 240 mg by mouth daily   Labwork: Your physician recommends that you have labs today LFT's, Amiodarone level, and TSH.   Testing/Procedures: A chest x-ray takes a picture of the organs and structures inside the chest, including the heart, lungs, and blood vessels. This test can show several things, including, whether the heart is enlarges; whether fluid is building up in the lungs; and whether pacemaker / defibrillator leads are still in place.  Follow-Up: Your physician recommends that you keep your scheduled appointment with [name].   Any Other Special Instructions Will Be Listed Below (If Applicable).

## 2015-03-31 NOTE — Assessment & Plan Note (Signed)
Type 2 NIDDM 

## 2015-03-31 NOTE — Assessment & Plan Note (Signed)
May 2015 DCCV with recurrent PAF a few weeks later Rx'd with Amioadarone and repeat DCCV

## 2015-03-31 NOTE — Progress Notes (Signed)
03/31/2015 [name]   08-30-1931  [id number]  Primary Physician Irven Shelling, MD Primary Cardiologist: Dr Burt Knack  HPI:  The patient is a pleasant 81 y/o female followed by Dr Burt Knack for atrial fibrillation. She underwent cardioversion after hospital admission for atrial fibrillation with RVR in May 2015. She reverted back to atrial fibrillation a few weeks later and was placed on amiodarone. A second cardioversion was successful and she has maintained sinus rhythm since June 2015. She has been anticoagulated with Eliquis. She is also followed for hypertension and diabetes.            She is sen in [facility] today with complaints of recurrent atrial fibrillation that started last evening. The pt reports she has missed no medications. Her Eliquis was held three weeks ago for one dose for gum surgery per the pt. She has not had any recent illness or taken any OTC medication that may have triggered this episode. She was on Diltiazem at one point but that was stopped secondary to constipation, though she says this has resolved and she is on a fiber laxative. She also tells me she was at the beach and received a "severe" sunburn to her feet (probably secondary to photosensitivity from Amiodarone). In the clinic today her HR is 110-130.   Current Outpatient Prescriptions  Medication Sig Dispense Refill  . amiodarone (PACERONE) 200 MG tablet Take 1 tablet (200 mg total) by mouth 2 (two) times daily. For seven days    . bimatoprost (LUMIGAN) 0.01 % SOLN Apply to eye. Eye drop    . Calcium-Phosphorus-Vitamin D (CITRACAL +D3 PO) Take 1 tablet by mouth every evening.    . Cyanocobalamin (B-12 PO) Take 1 tablet by mouth daily.    Marland Kitchen ELIQUIS 5 MG TABS tablet Take 1 tablet by mouth two  times daily 180 tablet 1  . FeFum-FePo-FA-B Cmp-C-Zn-Mn-Cu (TANDEM PLUS) 162-115.2-1 MG CAPS Take 1 tablet by mouth daily as needed (only as needed).    . fluticasone (FLONASE) 50 MCG/ACT nasal spray Place  1 spray into both nostrils daily.    . lansoprazole (PREVACID) 30 MG capsule Take 30 mg by mouth every morning.    Marland Kitchen levothyroxine (SYNTHROID, LEVOTHROID) 150 MCG tablet Take 150 mcg by mouth daily before breakfast.    . LORazepam (ATIVAN) 1 MG tablet Take 1.5 mg by mouth at bedtime.    Marland Kitchen losartan-hydrochlorothiazide (HYZAAR) 50-12.5 MG per tablet Take 1 tablet by mouth 2 (two) times daily. 60 tablet 11  . magnesium oxide (MAG-OX) 400 MG tablet Take 400 mg by mouth 2 (two) times daily.    . metFORMIN (GLUMETZA) 500 MG (MOD) 24 hr tablet Take 500-750 mg by mouth 2 (two) times daily with a meal. 750mg  in am, 500mg  in pm    . multivitamin-lutein (OCUVITE-LUTEIN) CAPS capsule Take 1 capsule by mouth daily.    . potassium chloride SA (K-DUR,KLOR-CON) 20 MEQ tablet Take 1 tablet by mouth  daily 90 tablet 1  . rosuvastatin (CRESTOR) 20 MG tablet Take 10 mg by mouth at bedtime.    . sertraline (ZOLOFT) 50 MG tablet Take 50 mg by mouth daily.    Marland Kitchen diltiazem (CARDIZEM CD) 240 MG 24 hr capsule Take 1 capsule (240 mg total) by mouth daily. 10 capsule 0   No current facility-administered medications for this visit.    Allergies  Allergen Reactions  . Penicillins Itching and Rash    History   Social History  . Marital Status:  Divorced    Spouse Name: N/A  . Number of Children: N/A  . Years of Education: N/A   Occupational History  . Not on file.   Social History Main Topics  . Smoking status: Never Smoker   . Smokeless tobacco: Never Used  . Alcohol Use: No  . Drug Use: No  . Sexual Activity: No   Other Topics Concern  . Not on file   Social History Narrative     Review of Systems: General: negative for chills, fever, night sweats or weight changes.  Cardiovascular: negative for chest pain, dyspnea on exertion, edema, orthopnea, palpitations, paroxysmal nocturnal dyspnea or shortness of breath Dermatological: negative for rash Respiratory: negative for cough or wheezing Urologic:  negative for hematuria Abdominal: negative for nausea, vomiting, diarrhea, bright red blood per rectum, melena, or hematemesis Neurologic: negative for visual changes, syncope, or dizziness All other systems reviewed and are otherwise negative except as noted above.    Blood pressure 115/64, pulse 110, height 5' 5.5" (1.664 m), weight 198 lb 12.8 oz (90.175 kg).  General appearance: alert, cooperative, no distress and mildly obese Neck: no carotid bruit and no JVD Lungs: clear to auscultation bilaterally Heart: irregularly irregular rhythm Extremities: trace edema Skin: cool and dry Neurologic: Grossly normal  EKG AF with RVR  ASSESSMENT AND PLAN:   PAF- s/p DCCV  May 2015 DCCV with recurrent PAF a few weeks later Rx'd with Amioadarone and repeat DCCV   Atrial fibrillation with RVR Seen in [facility] with AF with RVR, started last PM   Anticoagulated by anticoagulation treatment - Eliquis One dose held 3 weeks ago for gum surgery   Diabetes mellitus without complication Type 2 NIDDM    PLAN  Reviewed with Dr Radford Pax. Increase Amiodarone to 200 mg BID, resume Diltiazem 240 mg, decrease Hyzaar to 50/12.5, get labs today including an Amiodarone level. She has an appointment with Dr Burt Knack 5/27 and will keep that.   Erlene Quan PA-C 03/31/2015 3:54 PM

## 2015-03-31 NOTE — Telephone Encounter (Signed)
Lab orders (TSH and LFT) and CXR order is in system pending at this time.

## 2015-03-31 NOTE — Assessment & Plan Note (Signed)
Seen in the [facility] with AF with RVR, started last PM

## 2015-03-31 NOTE — Telephone Encounter (Signed)
Updated labs and CXR orders to reflect today's date, per Dr. Antionette Char notes indicating these are to be done one week prior to scheduled appt. Patient is here today for "same day appt" and prefers to complete labwork today while already here at clinic.

## 2015-04-03 LAB — AMIODARONE LEVEL
Amiodarone Lvl: 1.1 ug/mL — ABNORMAL LOW (ref 1.5–2.5)
Desethylamiodarone: 0.9 ug/mL — ABNORMAL LOW (ref 1.5–2.5)

## 2015-04-07 ENCOUNTER — Other Ambulatory Visit: Payer: Self-pay | Admitting: Cardiology

## 2015-04-07 NOTE — Telephone Encounter (Signed)
Rx has been sent to the pharmacy electronically. ° °

## 2015-04-08 ENCOUNTER — Other Ambulatory Visit: Payer: Self-pay

## 2015-04-08 DIAGNOSIS — I10 Essential (primary) hypertension: Secondary | ICD-10-CM

## 2015-04-08 MED ORDER — LOSARTAN POTASSIUM-HCTZ 50-12.5 MG PO TABS: 1.0000 | ORAL_TABLET | Freq: Two times a day (BID) | ORAL | Status: DC

## 2015-04-08 MED ORDER — DILTIAZEM HCL ER COATED BEADS 240 MG PO CP24: 240.0000 mg | ORAL_CAPSULE | Freq: Every day | ORAL | Status: DC

## 2015-04-08 NOTE — Telephone Encounter (Signed)
Per note 5.18.16

## 2015-04-09 ENCOUNTER — Other Ambulatory Visit (INDEPENDENT_AMBULATORY_CARE_PROVIDER_SITE_OTHER): Payer: Medicare Other | Admitting: *Deleted

## 2015-04-09 ENCOUNTER — Ambulatory Visit (INDEPENDENT_AMBULATORY_CARE_PROVIDER_SITE_OTHER): Payer: Medicare Other | Admitting: Cardiovascular Disease

## 2015-04-09 ENCOUNTER — Encounter: Payer: Self-pay | Admitting: Cardiovascular Disease

## 2015-04-09 VITALS — BP 130/60 | HR 112 | Ht 66.5 in | Wt 200.8 lb

## 2015-04-09 DIAGNOSIS — I4891 Unspecified atrial fibrillation: Secondary | ICD-10-CM | POA: Diagnosis not present

## 2015-04-09 DIAGNOSIS — E7800 Pure hypercholesterolemia, unspecified: Secondary | ICD-10-CM

## 2015-04-09 DIAGNOSIS — I10 Essential (primary) hypertension: Secondary | ICD-10-CM

## 2015-04-09 LAB — CBC
HCT: 39.6 % (ref 36.0–46.0)
Hemoglobin: 13.1 g/dL (ref 12.0–15.0)
MCHC: 33 g/dL (ref 30.0–36.0)
MCV: 90 fl (ref 78.0–100.0)
Platelets: 197 10*3/uL (ref 150.0–400.0)
RBC: 4.4 Mil/uL (ref 3.87–5.11)
RDW: 14.6 % (ref 11.5–15.5)
WBC: 5.8 10*3/uL (ref 4.0–10.5)

## 2015-04-09 LAB — BASIC METABOLIC PANEL
BUN: 20 mg/dL (ref 6–23)
CALCIUM: 9.5 mg/dL (ref 8.4–10.5)
CO2: 31 meq/L (ref 19–32)
CREATININE: 0.99 mg/dL (ref 0.40–1.20)
Chloride: 101 mEq/L (ref 96–112)
GFR: 57.07 mL/min — ABNORMAL LOW (ref >60.00)
Glucose, Bld: 158 mg/dL — ABNORMAL HIGH (ref 70–99)
Potassium: 4.4 mEq/L (ref 3.5–5.1)
SODIUM: 138 meq/L (ref 135–145)

## 2015-04-09 NOTE — Progress Notes (Signed)
Cardiology Office Note   Date:  04/10/2015   ID:  [name], DOB January 21, 1931, MRN [id number]  PCP:  Irven Shelling, MD  Cardiologist:  Sherren Mocha, MD    Chief Complaint  Patient presents with  . Palpitations     History of Present Illness: [name] is a 81 y.o. female who presents for follow-up of atrial fibrillation. She has been anticoagulated with Eliquis. She underwent cardioversion after hospital admission for atrial fibrillation with RVR. She reverted back to atrial fibrillation and was placed on amiodarone. A second cardioversion was successful and she has maintained sinus rhythm since June 2015. She is also followed for hypertension and diabetes.  The patient developed sudden onset of tachypalpitations 5/17 and was seen the following day and found to be in atrial fibrillation with RVR. She was started back on cardizem CD 240 mg daily and arranged to be seen today. She continues on Eliquis and has not missed a dose now in over 5 weeks. She complains of continued fatigue, weakness, and exertional dyspnea. Denies lightheadedness or presyncope. Monitors heart rate and it has ranged from 85-130's (with activity). Usually around 100 bpm at rest. No chest pain.  Past Medical History  Diagnosis Date  . Hypothyroidism, postsurgical   . Hypertension   . Glaucoma   . Cervical cancer   . Breast cancer     s/p right breast lumpectomy  . Thyroid cancer     s/p thyroidectomy  . Atrial fibrillation   . 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 Type II diabetes mellitus   . OSA on CPAP   . H/O hiatal hernia   . GERD (gastroesophageal reflux disease)   . Migraine     "last one was years ago" (03/31/2014)  . DJD (degenerative joint disease) of knee   . Arthritis     "all my joints" (03/31/2014)    Past Surgical  History  Procedure Laterality Date  . Thyroidectomy    . Tee without cardioversion N/A 03/30/2014    Procedure: TRANSESOPHAGEAL ECHOCARDIOGRAM (TEE);  Surgeon: [name], MD;  Location: [place];  Service: Cardiovascular;  Laterality: N/A;  . Cardioversion N/A 03/30/2014    Procedure: CARDIOVERSION - BEDSIDE;  Surgeon: [name], MD;  Location: [place];  Service: Cardiovascular;  Laterality: N/A;  . Tonsillectomy    . Excisional hemorrhoidectomy    . Carpal tunnel release Right   . Abdominal hysterectomy      "partial"  . Cataract extraction w/ intraocular lens  implant, bilateral Bilateral   . Breast biopsy Right   . Breast lumpectomy Right   . Cardioversion N/A 03/31/2014    Procedure: CARDIOVERSION  (BEDSIDE) ;  Surgeon: Lelon Perla, MD;  Location: [place];  Service: Cardiovascular;  Laterality: N/A;  . Cardioversion N/A 04/27/2014    Procedure: CARDIOVERSION;  Surgeon: Darlin Coco, MD;  Location: [facility] ENDOSCOPY;  Service: Cardiovascular;  Laterality: N/A;    Current Outpatient Prescriptions  Medication Sig Dispense Refill  . bimatoprost (LUMIGAN) 0.01 % SOLN Apply to eye. Eye drop    . Calcium-Phosphorus-Vitamin D (CITRACAL +D3 PO) Take 1 tablet by mouth every evening.    . Cyanocobalamin (B-12 PO) Take 1 tablet by mouth daily.    Marland Kitchen diltiazem (CARTIA XT) 240 MG 24 hr capsule Take 1 capsule (240 mg total)  by mouth daily. 90 capsule 3  . ELIQUIS 5 MG TABS tablet Take 1 tablet by mouth two  times daily 180 tablet 1  . FeFum-FePo-FA-B Cmp-C-Zn-Mn-Cu (TANDEM PLUS) 162-115.2-1 MG CAPS Take 1 tablet by mouth daily as needed (only as needed).    . fluticasone (FLONASE) 50 MCG/ACT nasal spray Place 1 spray into both nostrils daily.    . lansoprazole (PREVACID) 30 MG capsule Take 30 mg by mouth every morning.    Marland Kitchen levothyroxine (SYNTHROID, LEVOTHROID) 150 MCG tablet Take 150 mcg by mouth daily before breakfast.    . LORazepam (ATIVAN) 1 MG tablet Take 1.5 mg by mouth at  bedtime.    Marland Kitchen losartan-hydrochlorothiazide (HYZAAR) 50-12.5 MG per tablet Take 1 tablet by mouth 2 (two) times daily. 180 tablet 3  . magnesium oxide (MAG-OX) 400 MG tablet Take 400 mg by mouth 2 (two) times daily.    . metFORMIN (GLUMETZA) 500 MG (MOD) 24 hr tablet Take 500-750 mg by mouth 2 (two) times daily with a meal. 750mg  in am, 500mg  in pm    . multivitamin-lutein (OCUVITE-LUTEIN) CAPS capsule Take 1 capsule by mouth daily.    . ONE TOUCH ULTRA TEST test strip     . ONETOUCH DELICA LANCETS 75I MISC     . potassium chloride SA (K-DUR,KLOR-CON) 20 MEQ tablet Take 1 tablet by mouth  daily 90 tablet 1  . rosuvastatin (CRESTOR) 20 MG tablet Take 10 mg by mouth at bedtime.    . sertraline (ZOLOFT) 50 MG tablet Take 50 mg by mouth daily.    Marland Kitchen amiodarone (PACERONE) 200 MG tablet Take 1 tablet (200 mg total) by mouth 2 (two) times daily. For seven days     No current facility-administered medications for this visit.    Allergies:   Penicillins   Social History:  The patient  reports that she has never smoked. She has never used smokeless tobacco. She reports that she does not drink alcohol or use illicit drugs.   Family History:  The patient's family history includes Diabetes in her brother, brother, brother, brother, brother, brother, brother, sister, sister, and sister; Heart attack in her brother; Heart disease in her brother, brother, brother, brother, brother, brother, and sister; Pancreatic cancer in her sister; Prostate cancer in her father.    ROS:  Please see the history of present illness.  Otherwise, review of systems is positive for orthopnea, diaphoresis, palpitations, and nausea.  All other systems are reviewed and negative.    PHYSICAL EXAM: VS:  BP 130/60 mmHg  Pulse 112  Ht 5' 6.5" (1.689 m)  Wt 200 lb 12.8 oz (91.082 kg)  BMI 31.93 kg/m2 , BMI Body mass index is 31.93 kg/(m^2). GEN: Well nourished, well developed, in no acute distress HEENT: normal Neck: no JVD, no  masses. No carotid bruits Cardiac: irregularly irregular without murmur or gallop          Respiratory:  clear to auscultation bilaterally, normal work of breathing GI: soft, nontender, nondistended, + BS MS: no deformity or atrophy Ext: trace pretibial edema, pedal pulses 2+= bilaterally Skin: warm and dry, no rash Neuro:  Strength and sensation are intact Psych: euthymic mood, full affect  EKG:  EKG is ordered today. The ekg ordered today shows AF with RVR 112 bpm, otherwise no significant abnormalities  Recent Labs: 03/31/2015: ALT 17; TSH 2.11 04/09/2015: BUN 20; Creatinine 0.99; Hemoglobin 13.1; Platelets 197.0; Potassium 4.4; Sodium 138   Lipid Panel  No results found for: CHOL, TRIG,  HDL, CHOLHDL, VLDL, LDLCALC, LDLDIRECT    Wt Readings from Last 3 Encounters:  04/09/15 200 lb 12.8 oz (91.082 kg)  03/31/15 198 lb 12.8 oz (90.175 kg)  09/07/14 194 lb 12.8 oz (88.361 kg)    ASSESSMENT AND PLAN: 1.  Atrial fibrillation with rapid ventricular response: The patient continues in atrial fibrillation. I reviewed her home monitor readings, and heart rate is generally elevated. I think we should repeat cardioversion. She maintained sinus rhythm for almost 1 year after cardioversion on amiodarone. She continues on amiodarone and her dose has been increased. She understands the risks, potential benefits, and alternatives to elective cardioversion. She has been consistent with anticoagulation using Eliquis 5 mg twice daily now for greater than 3 weeks. If she has a recurrence of atrial fibrillation following cardioversion, will refer her to EP. Also we'll check an echocardiogram to evaluate LV function and atrial size.  2. Essential HTN: medications reviewed, continue current Rx.   3. Chronic anticoagulation: tolerating without bleeding problems.  4. Diabetes, Type 2: appears stable.  Current medicines are reviewed with the patient today.  The patient does not have concerns regarding  medicines.  Labs/ tests ordered today include:   Orders Placed This Encounter  Procedures  . Basic Metabolic Panel (BMET)  . CBC  . EKG 12-Lead  . Echocardiogram    Disposition:   FU 1 month  Signed, Sherren Mocha, MD  04/10/2015 5:23 PM    [address], [place], [place]  [address] Phone: [phone]; Fax: [phone]

## 2015-04-09 NOTE — Addendum Note (Signed)
Addended by: Eulis Foster on: 04/09/2015 02:01 PM   Modules accepted: Orders

## 2015-04-09 NOTE — Addendum Note (Signed)
Addended by: Eulis Foster on: 04/09/2015 03:03 PM   Modules accepted: Orders

## 2015-04-09 NOTE — Patient Instructions (Signed)
Medication Instructions:  Your physician recommends that you continue on your current medications as directed. Please refer to the Current Medication list given to you today.  Labwork: Your physician recommends that you have lab work today: BMP and CBC  Testing/Procedures: Your physician has requested that you have an echocardiogram. Echocardiography is a painless test that uses sound waves to create images of your heart. It provides your doctor with information about the size and shape of your heart and how well your heart's chambers and valves are working. This procedure takes approximately one hour. There are no restrictions for this procedure.  Your physician has recommended that you have a Cardioversion (DCCV). Electrical Cardioversion uses a jolt of electricity to your heart either through paddles or wired patches attached to your chest. This is a controlled, usually prescheduled, procedure. Defibrillation is done under light anesthesia in the hospital, and you usually go home the day of the procedure. This is done to get your heart back into a normal rhythm. You are not awake for the procedure. Please see the instruction sheet given to you today.  Follow-Up: Your physician recommends that you schedule a follow-up appointment in: 1 MONTH with PA/NP  Your physician recommends that you schedule a follow-up appointment in: September with Dr Burt Knack   Any Other Special Instructions Will Be Listed Below (If Applicable).

## 2015-04-10 ENCOUNTER — Encounter: Payer: Self-pay | Admitting: Cardiovascular Disease

## 2015-04-13 ENCOUNTER — Encounter (HOSPITAL_COMMUNITY)
Admission: RE | Disposition: A | Discharge status: Home / Self Care | Payer: Self-pay | Source: Ambulatory Visit | Attending: Cardiovascular Disease

## 2015-04-13 ENCOUNTER — Ambulatory Visit (HOSPITAL_COMMUNITY): Payer: Medicare Other | Admitting: Anesthesiology

## 2015-04-13 ENCOUNTER — Ambulatory Visit (HOSPITAL_COMMUNITY)
Admission: RE | Admit: 2015-04-13 | Discharge: 2015-04-13 | Disposition: A | Discharge status: Home / Self Care | Payer: Medicare Other | Source: Ambulatory Visit | Attending: Cardiovascular Disease | Admitting: Cardiovascular Disease

## 2015-04-13 ENCOUNTER — Encounter (HOSPITAL_COMMUNITY): Payer: Self-pay | Admitting: *Deleted

## 2015-04-13 DIAGNOSIS — G43909 Migraine, unspecified, not intractable, without status migrainosus: Secondary | ICD-10-CM | POA: Diagnosis not present

## 2015-04-13 DIAGNOSIS — Z9989 Dependence on other enabling machines and devices: Secondary | ICD-10-CM | POA: Diagnosis not present

## 2015-04-13 DIAGNOSIS — H409 Unspecified glaucoma: Secondary | ICD-10-CM | POA: Diagnosis not present

## 2015-04-13 DIAGNOSIS — Z853 Personal history of malignant neoplasm of breast: Secondary | ICD-10-CM | POA: Insufficient documentation

## 2015-04-13 DIAGNOSIS — Z85850 Personal history of malignant neoplasm of thyroid: Secondary | ICD-10-CM | POA: Insufficient documentation

## 2015-04-13 DIAGNOSIS — I10 Essential (primary) hypertension: Secondary | ICD-10-CM | POA: Diagnosis not present

## 2015-04-13 DIAGNOSIS — M179 Osteoarthritis of knee, unspecified: Secondary | ICD-10-CM | POA: Diagnosis not present

## 2015-04-13 DIAGNOSIS — I4891 Unspecified atrial fibrillation: Secondary | ICD-10-CM | POA: Diagnosis present

## 2015-04-13 DIAGNOSIS — K219 Gastro-esophageal reflux disease without esophagitis: Secondary | ICD-10-CM | POA: Insufficient documentation

## 2015-04-13 DIAGNOSIS — Z9889 Other specified postprocedural states: Secondary | ICD-10-CM | POA: Insufficient documentation

## 2015-04-13 DIAGNOSIS — E119 Type 2 diabetes mellitus without complications: Secondary | ICD-10-CM | POA: Insufficient documentation

## 2015-04-13 DIAGNOSIS — Z8541 Personal history of malignant neoplasm of cervix uteri: Secondary | ICD-10-CM | POA: Insufficient documentation

## 2015-04-13 DIAGNOSIS — Z7901 Long term (current) use of anticoagulants: Secondary | ICD-10-CM | POA: Insufficient documentation

## 2015-04-13 DIAGNOSIS — I4430 Unspecified atrioventricular block: Secondary | ICD-10-CM | POA: Insufficient documentation

## 2015-04-13 DIAGNOSIS — E890 Postprocedural hypothyroidism: Secondary | ICD-10-CM | POA: Diagnosis not present

## 2015-04-13 DIAGNOSIS — F419 Anxiety disorder, unspecified: Secondary | ICD-10-CM | POA: Diagnosis not present

## 2015-04-13 DIAGNOSIS — I4892 Unspecified atrial flutter: Secondary | ICD-10-CM | POA: Diagnosis not present

## 2015-04-13 DIAGNOSIS — G4733 Obstructive sleep apnea (adult) (pediatric): Secondary | ICD-10-CM | POA: Insufficient documentation

## 2015-04-13 DIAGNOSIS — Z79899 Other long term (current) drug therapy: Secondary | ICD-10-CM | POA: Diagnosis not present

## 2015-04-13 DIAGNOSIS — R9431 Abnormal electrocardiogram [ECG] [EKG]: Secondary | ICD-10-CM | POA: Diagnosis not present

## 2015-04-13 HISTORY — PX: CARDIOVERSION: SHX1299

## 2015-04-13 LAB — GLUCOSE, CAPILLARY: Glucose-Capillary: 151 mg/dL — ABNORMAL HIGH (ref 65–99)

## 2015-04-13 SURGERY — CARDIOVERSION
Anesthesia: Monitor Anesthesia Care

## 2015-04-13 MED ORDER — SODIUM CHLORIDE 0.9 % IV SOLN
INTRAVENOUS | Status: DC
  Administered 2015-04-13: 500 mL via INTRAVENOUS

## 2015-04-13 MED ORDER — LIDOCAINE HCL (CARDIAC) 20 MG/ML IV SOLN
INTRAVENOUS | Status: DC | PRN
  Administered 2015-04-13: 40 mg via INTRAVENOUS

## 2015-04-13 MED ORDER — AMIODARONE HCL 200 MG PO TABS: 200.0000 mg | ORAL_TABLET | Freq: Every day | ORAL | Status: DC

## 2015-04-13 MED ORDER — PROPOFOL 10 MG/ML IV BOLUS
INTRAVENOUS | Status: DC | PRN
  Administered 2015-04-13: 60 mg via INTRAVENOUS

## 2015-04-13 NOTE — Interval H&P Note (Signed)
History and Physical Interval Note:  04/13/2015 10:48 AM  [name]  has presented today for surgery, with the diagnosis of AFIB  The various methods of treatment have been discussed with the patient and family. After consideration of risks, benefits and other options for treatment, the patient has consented to  Procedure(s): CARDIOVERSION (N/A) as a surgical intervention .  The patient's history has been reviewed, patient examined, no change in status, stable for surgery.  I have reviewed the patient's chart and labs.  Questions were answered to the patient's satisfaction.     Kellina Dreese, Wonda Cheng

## 2015-04-13 NOTE — Anesthesia Postprocedure Evaluation (Signed)
  Anesthesia Post-op Note  Patient: [name]  Procedure(s) Performed: Procedure(s): CARDIOVERSION (N/A)  Patient Location: Endoscopy Unit  Anesthesia Type:MAC  Level of Consciousness: awake, alert , oriented and pateint cooperative  Airway and Oxygen Therapy: Patient Spontanous Breathing and Patient connected to nasal cannula oxygen  Post-op Pain: none  Post-op Assessment: Post-op Vital signs reviewed, Patient's Cardiovascular Status Stable, Respiratory Function Stable, Patent Airway Post-op Vital Signs: Reviewed and unstable  Last Vitals:  Filed Vitals:   04/13/15 0922  BP: 137/99  Pulse: 109  Temp: 36.5 C  Resp: 18    Complications: No apparent anesthesia complications

## 2015-04-13 NOTE — CV Procedure (Signed)
    Cardioversion Note  [name] [id number] 15-Oct-1931  Procedure: DC Cardioversion Indications: atrial fib   Procedure Details Consent: Obtained Time Out: Verified patient identification, verified procedure, site/side was marked, verified correct patient position, special equipment/implants available, Radiology Safety Procedures followed,  medications/allergies/relevent history reviewed, required imaging and test results available.  Performed  The patient has been on adequate anticoagulation.  The patient received IV Lidocaine 40 mg followed by Propofol 60 mg iv  for sedation.  Synchronous cardioversion was performed at 120  joules.  The cardioversion was successful.     Complications: No apparent complications Patient did tolerate procedure well.   Thayer Headings, Brooke Bonito., MD, [facility] 04/13/2015, 11:17 AM

## 2015-04-13 NOTE — Transfer of Care (Signed)
Immediate Anesthesia Transfer of Care Note  Patient: [name]  Procedure(s) Performed: Procedure(s): CARDIOVERSION (N/A)  Patient Location: Endoscopy Unit  Anesthesia Type:MAC  Level of Consciousness: awake, alert , oriented and patient cooperative  Airway & Oxygen Therapy: Patient Spontanous Breathing and Patient connected to nasal cannula oxygen  Post-op Assessment: Report given to RN, Post -op Vital signs reviewed and stable and Patient moving all extremities  Post vital signs: Reviewed and stable  Last Vitals:  Filed Vitals:   04/13/15 0922  BP: 137/99  Pulse: 109  Temp: 36.5 C  Resp: 18    Complications: No apparent anesthesia complications

## 2015-04-13 NOTE — Anesthesia Preprocedure Evaluation (Addendum)
Anesthesia Evaluation  Patient identified by MRN, date of birth, ID band Patient awake    Reviewed: Allergy & Precautions, NPO status , Patient's Chart, lab work & pertinent test results  History of Anesthesia Complications Negative for: history of anesthetic complications  Airway Mallampati: II  TM Distance: >3 FB Neck ROM: Full    Dental  (+) Teeth Intact, Dental Advisory Given   Pulmonary sleep apnea ,    Pulmonary exam normal       Cardiovascular hypertension, + dysrhythmias Atrial Fibrillation Rhythm:Irregular Rate:Tachycardia  Study Conclusions  - Left ventricle: Systolic function was normal. The estimated ejection fraction was in the range of 55% to 60%. - Mitral valve: There was mild regurgitation. - Left atrium: The atrium was dilated. No evidence of thrombus in the atrial cavity or appendage. 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 Mild MR Moderate LAE. 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    Neuro/Psych  Headaches, Anxiety negative psych ROS   GI/Hepatic Neg liver ROS, hiatal hernia, GERD-  ,  Endo/Other  diabetesHypothyroidism   Renal/GU negative Renal ROS     Musculoskeletal   Abdominal   Peds  Hematology   Anesthesia Other Findings   Reproductive/Obstetrics                           Anesthesia Physical Anesthesia Plan  ASA: III  Anesthesia Plan:    Post-op Pain Management:    Induction: Intravenous  Airway Management Planned: Simple Face Mask  Additional Equipment:   Intra-op Plan:   Post-operative Plan:   Informed Consent: I have reviewed the patients History and Physical, chart, labs and discussed the procedure including the risks, benefits and alternatives for the proposed anesthesia with the patient or authorized representative who has indicated his/her  understanding and acceptance.   Dental advisory given  Plan Discussed with: CRNA, Anesthesiologist and Surgeon  Anesthesia Plan Comments:        Anesthesia Quick Evaluation

## 2015-04-13 NOTE — Discharge Instructions (Signed)
Monitored Anesthesia Care Monitored anesthesia care is an anesthesia service for a medical procedure. Anesthesia is the loss of the ability to feel pain. It is produced by medicines called anesthetics. It may affect a small area of your body (local anesthesia), a large area of your body (regional anesthesia), or your entire body (general anesthesia). The need for monitored anesthesia care depends your procedure, your condition, and the potential need for regional or general anesthesia. It is often provided during procedures where:   General anesthesia may be needed if there are complications. This is because you need special care when you are under general anesthesia.   You will be under local or regional anesthesia. This is so that you are able to have higher levels of anesthesia if needed.   You will receive calming medicines (sedatives). This is especially the case if sedatives are given to put you in a semi-conscious state of relaxation (deep sedation). This is because the amount of sedative needed to produce this state can be hard to predict. Too much of a sedative can produce general anesthesia. Monitored anesthesia care is performed by one or more health care providers who have special training in all types of anesthesia. You will need to meet with these health care providers before your procedure. During this meeting, they will ask you about your medical history. They will also give you instructions to follow. (For example, you will need to stop eating and drinking before your procedure. You may also need to stop or change medicines you are taking.) During your procedure, your health care providers will stay with you. They will:   Watch your condition. This includes watching your blood pressure, breathing, and level of pain.   Diagnose and treat problems that occur.   Give medicines if they are needed. These may include calming medicines (sedatives) and anesthetics.   Make sure you are  comfortable.  Having monitored anesthesia care does not necessarily mean that you will be under anesthesia. It does mean that your health care providers will be able to manage anesthesia if you need it or if it occurs. It also means that you will be able to have a different type of anesthesia than you are having if you need it. When your procedure is complete, your health care providers will continue to watch your condition. They will make sure any medicines wear off before you are allowed to go home.  Document Released: 07/26/2005 Document Revised: 03/16/2014 Document Reviewed: 12/11/2012 [facility] Patient Information 2015 [place], [place]. This information is not intended to replace advice given to you by your health care provider. Make sure you discuss any questions you have with your health care provider. Electrical Cardioversion Electrical cardioversion is the delivery of a jolt of electricity to change the rhythm of the heart. Sticky patches or metal paddles are placed on the chest to deliver the electricity from a device. This is done to restore a normal rhythm. A rhythm that is too fast or not regular keeps the heart from pumping well. Electrical cardioversion is done in an emergency if:   There is low or no blood pressure as a result of the heart rhythm.   Normal rhythm must be restored as fast as possible to protect the brain and heart from further damage.   It may save a life. Cardioversion may be done for heart rhythms that are not immediately life threatening, such as atrial fibrillation or flutter, in which:   The heart is beating too fast or  is not regular.   Medicine to change the rhythm has not worked.   It is safe to wait in order to allow time for preparation.  Symptoms of the abnormal rhythm are bothersome.  The risk of stroke and other serious problems can be reduced. LET [facility] CARE PROVIDER KNOW ABOUT:   Any allergies you have.  All medicines you are taking,  including vitamins, herbs, eye drops, creams, and over-the-counter medicines.  Previous problems you or members of your family have had with the use of anesthetics.   Any blood disorders you have.   Previous surgeries you have had.   Medical conditions you have. RISKS AND COMPLICATIONS  Generally, this is a safe procedure. However, problems can occur and include:   Breathing problems related to the anesthetic used.  A blood clot that breaks free and travels to other parts of your body. This could cause a stroke or other problems. The risk of this is lowered by use of blood-thinning medicine (anticoagulant) prior to the procedure.  Cardiac arrest (rare). BEFORE THE PROCEDURE   You may have tests to detect blood clots in your heart and to evaluate heart function.  You may start taking anticoagulants so your blood does not clot as easily.   Medicines may be given to help stabilize your heart rate and rhythm. PROCEDURE  You will be given medicine through an IV tube to reduce discomfort and make you sleepy (sedative).   An electrical shock will be delivered. AFTER THE PROCEDURE Your heart rhythm will be watched to make sure it does not change.  Document Released: 10/20/2002 Document Revised: 03/16/2014 Document Reviewed: 05/14/2013 [facility] Patient Information 2015 [place], [place]. This information is not intended to replace advice given to you by your health care provider. Make sure you discuss any questions you have with your health care provider.

## 2015-04-13 NOTE — H&P (View-Only) (Signed)
Cardiology Office Note   Date:  04/10/2015   ID:  [name], DOB [birth year], MRN [id number]  PCP:  Irven Shelling, MD  Cardiologist:  Sherren Mocha, MD    Chief Complaint  Patient presents with  . Palpitations     History of Present Illness: [name] is a 81 y.o. female who presents for follow-up of atrial fibrillation. She has been anticoagulated with Eliquis. She underwent cardioversion after hospital admission for atrial fibrillation with RVR. She reverted back to atrial fibrillation and was placed on amiodarone. A second cardioversion was successful and she has maintained sinus rhythm since June 2015. She is also followed for hypertension and diabetes.  The patient developed sudden onset of tachypalpitations 5/17 and was seen the following day and found to be in atrial fibrillation with RVR. She was started back on cardizem CD 240 mg daily and arranged to be seen today. She continues on Eliquis and has not missed a dose now in over 5 weeks. She complains of continued fatigue, weakness, and exertional dyspnea. Denies lightheadedness or presyncope. Monitors heart rate and it has ranged from 85-130's (with activity). Usually around 100 bpm at rest. No chest pain.  Past Medical History  Diagnosis Date  . Hypothyroidism, postsurgical   . Hypertension   . Glaucoma   . Cervical cancer   . Breast cancer     s/p right breast lumpectomy  . Thyroid cancer     s/p thyroidectomy  . Atrial fibrillation   . 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 Type II diabetes mellitus   . OSA on CPAP   . H/O hiatal hernia   . GERD (gastroesophageal reflux disease)   . Migraine     "last one was years ago" (03/31/2014)  . DJD (degenerative joint disease) of knee   . Arthritis     "all my joints" (03/31/2014)    Past Surgical  History  Procedure Laterality Date  . Thyroidectomy    . Tee without cardioversion N/A 03/30/2014    Procedure: TRANSESOPHAGEAL ECHOCARDIOGRAM (TEE);  Surgeon: [name], MD;  Location: [place];  Service: Cardiovascular;  Laterality: N/A;  . Cardioversion N/A 03/30/2014    Procedure: CARDIOVERSION - BEDSIDE;  Surgeon: [name], MD;  Location: [place];  Service: Cardiovascular;  Laterality: N/A;  . Tonsillectomy    . Excisional hemorrhoidectomy    . Carpal tunnel release Right   . Abdominal hysterectomy      "partial"  . Cataract extraction w/ intraocular lens  implant, bilateral Bilateral   . Breast biopsy Right   . Breast lumpectomy Right   . Cardioversion N/A 03/31/2014    Procedure: CARDIOVERSION  (BEDSIDE) ;  Surgeon: Lelon Perla, MD;  Location: [place];  Service: Cardiovascular;  Laterality: N/A;  . Cardioversion N/A 04/27/2014    Procedure: CARDIOVERSION;  Surgeon: Darlin Coco, MD;  Location: [facility] ENDOSCOPY;  Service: Cardiovascular;  Laterality: N/A;    Current Outpatient Prescriptions  Medication Sig Dispense Refill  . bimatoprost (LUMIGAN) 0.01 % SOLN Apply to eye. Eye drop    . Calcium-Phosphorus-Vitamin D (CITRACAL +D3 PO) Take 1 tablet by mouth every evening.    . Cyanocobalamin (B-12 PO) Take 1 tablet by mouth daily.    Marland Kitchen diltiazem (CARTIA XT) 240 MG 24 hr capsule Take 1 capsule (240 mg total)  by mouth daily. 90 capsule 3  . ELIQUIS 5 MG TABS tablet Take 1 tablet by mouth two  times daily 180 tablet 1  . FeFum-FePo-FA-B Cmp-C-Zn-Mn-Cu (TANDEM PLUS) 162-115.2-1 MG CAPS Take 1 tablet by mouth daily as needed (only as needed).    . fluticasone (FLONASE) 50 MCG/ACT nasal spray Place 1 spray into both nostrils daily.    . lansoprazole (PREVACID) 30 MG capsule Take 30 mg by mouth every morning.    Marland Kitchen levothyroxine (SYNTHROID, LEVOTHROID) 150 MCG tablet Take 150 mcg by mouth daily before breakfast.    . LORazepam (ATIVAN) 1 MG tablet Take 1.5 mg by mouth at  bedtime.    Marland Kitchen losartan-hydrochlorothiazide (HYZAAR) 50-12.5 MG per tablet Take 1 tablet by mouth 2 (two) times daily. 180 tablet 3  . magnesium oxide (MAG-OX) 400 MG tablet Take 400 mg by mouth 2 (two) times daily.    . metFORMIN (GLUMETZA) 500 MG (MOD) 24 hr tablet Take 500-750 mg by mouth 2 (two) times daily with a meal. 750mg  in am, 500mg  in pm    . multivitamin-lutein (OCUVITE-LUTEIN) CAPS capsule Take 1 capsule by mouth daily.    . ONE TOUCH ULTRA TEST test strip     . ONETOUCH DELICA LANCETS 08Q MISC     . potassium chloride SA (K-DUR,KLOR-CON) 20 MEQ tablet Take 1 tablet by mouth  daily 90 tablet 1  . rosuvastatin (CRESTOR) 20 MG tablet Take 10 mg by mouth at bedtime.    . sertraline (ZOLOFT) 50 MG tablet Take 50 mg by mouth daily.    Marland Kitchen amiodarone (PACERONE) 200 MG tablet Take 1 tablet (200 mg total) by mouth 2 (two) times daily. For seven days     No current facility-administered medications for this visit.    Allergies:   Penicillins   Social History:  The patient  reports that she has never smoked. She has never used smokeless tobacco. She reports that she does not drink alcohol or use illicit drugs.   Family History:  The patient's family history includes Diabetes in her brother, brother, brother, brother, brother, brother, brother, sister, sister, and sister; Heart attack in her brother; Heart disease in her brother, brother, brother, brother, brother, brother, and sister; Pancreatic cancer in her sister; Prostate cancer in her father.    ROS:  Please see the history of present illness.  Otherwise, review of systems is positive for orthopnea, diaphoresis, palpitations, and nausea.  All other systems are reviewed and negative.    PHYSICAL EXAM: VS:  BP 130/60 mmHg  Pulse 112  Ht 5' 6.5" (1.689 m)  Wt 200 lb 12.8 oz (91.082 kg)  BMI 31.93 kg/m2 , BMI Body mass index is 31.93 kg/(m^2). GEN: Well nourished, well developed, in no acute distress HEENT: normal Neck: no JVD, no  masses. No carotid bruits Cardiac: irregularly irregular without murmur or gallop          Respiratory:  clear to auscultation bilaterally, normal work of breathing GI: soft, nontender, nondistended, + BS MS: no deformity or atrophy Ext: trace pretibial edema, pedal pulses 2+= bilaterally Skin: warm and dry, no rash Neuro:  Strength and sensation are intact Psych: euthymic mood, full affect  EKG:  EKG is ordered today. The ekg ordered today shows AF with RVR 112 bpm, otherwise no significant abnormalities  Recent Labs: 03/31/2015: ALT 17; TSH 2.11 04/09/2015: BUN 20; Creatinine 0.99; Hemoglobin 13.1; Platelets 197.0; Potassium 4.4; Sodium 138   Lipid Panel  No results found for: CHOL, TRIG,  HDL, CHOLHDL, VLDL, LDLCALC, LDLDIRECT    Wt Readings from Last 3 Encounters:  04/09/15 200 lb 12.8 oz (91.082 kg)  03/31/15 198 lb 12.8 oz (90.175 kg)  09/07/14 194 lb 12.8 oz (88.361 kg)    ASSESSMENT AND PLAN: 1.  Atrial fibrillation with rapid ventricular response: The patient continues in atrial fibrillation. I reviewed her home monitor readings, and heart rate is generally elevated. I think we should repeat cardioversion. She maintained sinus rhythm for almost 1 year after cardioversion on amiodarone. She continues on amiodarone and her dose has been increased. She understands the risks, potential benefits, and alternatives to elective cardioversion. She has been consistent with anticoagulation using Eliquis 5 mg twice daily now for greater than 3 weeks. If she has a recurrence of atrial fibrillation following cardioversion, will refer her to EP. Also we'll check an echocardiogram to evaluate LV function and atrial size.  2. Essential HTN: medications reviewed, continue current Rx.   3. Chronic anticoagulation: tolerating without bleeding problems.  4. Diabetes, Type 2: appears stable.  Current medicines are reviewed with the patient today.  The patient does not have concerns regarding  medicines.  Labs/ tests ordered today include:   Orders Placed This Encounter  Procedures  . Basic Metabolic Panel (BMET)  . CBC  . EKG 12-Lead  . Echocardiogram    Disposition:   FU 1 month  Signed, Sherren Mocha, MD  04/10/2015 5:23 PM    [address], [place], [place]  [address] Phone: [phone]; Fax: [phone]

## 2015-04-13 NOTE — CV Procedure (Signed)
    Cardioversion Note  [name] [id number] 1931/02/11  Procedure: DC Cardioversion Indications: atrial fib  Procedure Details Consent: Obtained Time Out: Verified patient identification, verified procedure, site/side was marked, verified correct patient position, special equipment/implants available, Radiology Safety Procedures followed,  medications/allergies/relevent history reviewed, required imaging and test results available.  Performed  The patient has been on adequate anticoagulation.  The patient received IV Lidocaine 40 mg iv followed by Propofol 60 mg IV  for sedation.  Synchronous cardioversion was performed at 120  joules.  The cardioversion was successful     Complications: No apparent complications Patient did tolerate procedure well.   Thayer Headings, Brooke Bonito., MD, [facility] 04/13/2015, 11:23 AM

## 2015-04-14 ENCOUNTER — Encounter (HOSPITAL_COMMUNITY): Payer: Self-pay | Admitting: Cardiovascular Disease

## 2015-04-23 ENCOUNTER — Other Ambulatory Visit (HOSPITAL_COMMUNITY): Payer: Medicare Other

## 2015-04-27 ENCOUNTER — Other Ambulatory Visit: Payer: Self-pay

## 2015-04-27 ENCOUNTER — Ambulatory Visit (HOSPITAL_COMMUNITY): Payer: Medicare Other | Attending: Cardiology

## 2015-04-27 DIAGNOSIS — E7800 Pure hypercholesterolemia, unspecified: Secondary | ICD-10-CM

## 2015-04-27 DIAGNOSIS — I4891 Unspecified atrial fibrillation: Secondary | ICD-10-CM | POA: Diagnosis present

## 2015-04-27 DIAGNOSIS — I10 Essential (primary) hypertension: Secondary | ICD-10-CM | POA: Diagnosis not present

## 2015-05-11 ENCOUNTER — Telehealth: Payer: Self-pay

## 2015-05-11 ENCOUNTER — Ambulatory Visit (INDEPENDENT_AMBULATORY_CARE_PROVIDER_SITE_OTHER): Payer: Medicare Other | Admitting: Internal Medicine

## 2015-05-11 ENCOUNTER — Encounter: Payer: Self-pay | Admitting: Internal Medicine

## 2015-05-11 VITALS — BP 120/62 | HR 72 | Ht 65.5 in | Wt 203.0 lb

## 2015-05-11 DIAGNOSIS — K219 Gastro-esophageal reflux disease without esophagitis: Secondary | ICD-10-CM | POA: Diagnosis not present

## 2015-05-11 DIAGNOSIS — K2270 Barrett's esophagus without dysplasia: Secondary | ICD-10-CM | POA: Diagnosis not present

## 2015-05-11 MED ORDER — RANITIDINE HCL 150 MG PO TABS: 150.0000 mg | ORAL_TABLET | Freq: Every day | ORAL | Status: DC

## 2015-05-11 NOTE — Progress Notes (Signed)
[name] 18-Mar-1931 [id number]  Note: This dictation was prepared with Dragon digital system. Any transcriptional errors that result from this procedure are unintentional.   History of Present Illness: This is a 81 year old white female presents with the hoarseness, nocturnal cough and history of Barrett's esophagus on last upper endoscopy by Dr. 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 She has recently developed atrial fibrillation with rapid ventricular response and underwent at least 2 cardioversions which were unsuccessful. Her recent echocardiogram shows 60% ejection fraction. She is on Eliquis 5 mg twice a day. She has no history of TIA. Patient denies having dysphagia but she has a history ofl water brash and food regurgitation at night which has improved since she has started elevating the head of the bed. She has been on Prevacid 30 mg every morning. Her daughter reports that her mother loses her voice after 5 minutes of  being on the phone with her. Last colonoscopy in April 2009 was a normal exam. No recall planned due to age     Past Medical History  Diagnosis Date  . Hypothyroidism, postsurgical   . Hypertension   . Glaucoma   . Cervical cancer   . Breast cancer     s/p right breast lumpectomy  . Thyroid cancer     s/p thyroidectomy  . Atrial fibrillation   . 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 Type II diabetes mellitus   . OSA on CPAP   . H/O hiatal hernia   . GERD (gastroesophageal reflux disease)   . Migraine     "last one was years ago" (03/31/2014)  . DJD (degenerative joint disease) of knee   . Arthritis     "all my joints" (03/31/2014)    Past Surgical History  Procedure Laterality Date  . Thyroidectomy    . Tee without cardioversion N/A  03/30/2014    Procedure: TRANSESOPHAGEAL ECHOCARDIOGRAM (TEE);  Surgeon: [name], MD;  Location: [place];  Service: Cardiovascular;  Laterality: N/A;  . Cardioversion N/A 03/30/2014    Procedure: CARDIOVERSION - BEDSIDE;  Surgeon: [name], MD;  Location: [place];  Service: Cardiovascular;  Laterality: N/A;  . Tonsillectomy    . Excisional hemorrhoidectomy    . Carpal tunnel release Right   . Abdominal hysterectomy      "partial"  . Cataract extraction w/ intraocular lens  implant, bilateral Bilateral   . Breast biopsy Right   . Breast lumpectomy Right   . Cardioversion N/A 03/31/2014    Procedure: CARDIOVERSION  (BEDSIDE) ;  Surgeon: Lelon Perla, MD;  Location: [place];  Service: Cardiovascular;  Laterality: N/A;  . Cardioversion N/A 04/27/2014    Procedure: CARDIOVERSION;  Surgeon: Darlin Coco, MD;  Location: [place];  Service: Cardiovascular;  Laterality: N/A;  . Cardioversion N/A 04/13/2015    Procedure: CARDIOVERSION;  Surgeon: Thayer Headings, MD;  Location: [facility] ENDOSCOPY;  Service: Cardiovascular;  Laterality: N/A;    Allergies  Allergen Reactions  . Penicillins Itching and Rash    Family history and social history have been reviewed.  Review of Systems: Hoarseness. Weak voice. Denies dysphagia. Positive for nocturnal cough  The remainder of the 10 point ROS is negative except as outlined in the H&P  Physical Exam: General Appearance Well  developed, in no distress, hard of hearing  Eyes  Non icteric  HEENT  Non traumatic, normocephalic  Mouth No lesion, tongue papillated, no cheilosis Neck Supple without adenopathy, thyroid not enlarged, no carotid bruits, no JVD Lungs Clear to auscultation bilaterally COR Normal S1, normal S2, regular rhythm, no murmur, quiet precordium Abdomen Soft nontender  Rectal Noted done  Extremities  No pedal edema Skin No lesions Neurological Alert and oriented x 3 Psychological Normal mood and affect  Assessment  and Plan:   81 year old white female who has a history of gastroesophageal reflux disease and was diagnosed with Barrett's esophagus on last upper endoscopy in January 2012. She has a moderate size hiatal hernia and has been under reasonable control  with Prevacid 30 mg every morning. She has a new-onset of hoarseness and nocturnal cough which  may indicate LPR and  need for nocturnal acid supression. We will start ranitidine 150 g at bedtime. She is due for repeat upper endoscopy to rule out dysplasia. We will need to ask Dr. Antionette Char permission to hold Eliquis for 2 days prior to the procedure. Patient and her daughter agreed with the plan    Delfin Edis 05/11/2015

## 2015-05-11 NOTE — Telephone Encounter (Signed)
05/11/2015   RE: [name] DOB: 1930/12/31 MRN: [id number]   Dear [name],    We have scheduled the above patient for an endoscopic procedure. Our records show that she is on anticoagulation therapy.   Please advise as to how long the patient may come off her therapy of Eliquis prior to the procedure, which is scheduled for 06/09/2015.  Please fax back/ or route the completed form to Genella Mech at [phone].   Sincerely,    Margreta Journey

## 2015-05-11 NOTE — Telephone Encounter (Signed)
Can hold x 48 hours prior and resume when ok by GI physician. thx

## 2015-05-11 NOTE — Patient Instructions (Addendum)
We have sent medications to your pharmacy for you to pick up at your convenience.  You have been scheduled for an endoscopy. Please follow written instructions given to you at your visit today. If you use inhalers (even only as needed), please bring them with you on the day of your procedure. Your physician has requested that you go to www.startemmi.com and enter the access code given to you at your visit today. This web site gives a general overview about your procedure. However, you should still follow specific instructions given to you by our office regarding your preparation for the procedure.  We will contact your cardiologist about stopping your eliquis for your procedure. If you do not hear from [place] within one week of your appointment please give our office a call.   Dr Lavone Orn, Dr Jerilynn Mages.Cooper.

## 2015-05-14 ENCOUNTER — Ambulatory Visit (INDEPENDENT_AMBULATORY_CARE_PROVIDER_SITE_OTHER): Payer: Medicare Other | Admitting: Nurse Practitioner

## 2015-05-14 ENCOUNTER — Encounter: Payer: Self-pay | Admitting: Nurse Practitioner

## 2015-05-14 VITALS — BP 138/78 | HR 59 | Ht 65.5 in | Wt 204.0 lb

## 2015-05-14 DIAGNOSIS — Z79899 Other long term (current) drug therapy: Secondary | ICD-10-CM | POA: Diagnosis not present

## 2015-05-14 DIAGNOSIS — I4891 Unspecified atrial fibrillation: Secondary | ICD-10-CM | POA: Diagnosis not present

## 2015-05-14 DIAGNOSIS — I480 Paroxysmal atrial fibrillation: Secondary | ICD-10-CM

## 2015-05-14 NOTE — Telephone Encounter (Signed)
Called pt and left message with instructions on holding Eliquis.

## 2015-05-14 NOTE — Progress Notes (Signed)
CARDIOLOGY OFFICE NOTE  Date:  05/14/2015    [name] Date of Birth: 1930/12/18 Medical Record #[id number]  PCP:  Irven Shelling, MD  Cardiologist:  Burt Knack  Chief Complaint  Patient presents with  . Atrial Fibrillation    1 month check - seen for [name]    History of Present Illness: [name] is a 81 y.o. female who presents today for a follow up visit - one month check. Seen for [name]. She is followed for atrial fibrillation. She has been anticoagulated with Eliquis. She underwent cardioversion after hospital admission for atrial fibrillation with RVR. She reverted back to atrial fibrillation and was placed on amiodarone. A second cardioversion was successful and she has maintained sinus rhythm since June 2015. She is also followed for hypertension and diabetes.  The patient developed sudden onset of tachypalpitations back in May of 2016 and was seen the following day and found to be in atrial fibrillation with RVR. She was started back on cardizem CD 240 mg daily.    Saw [name] later in May - referred on for repeat cardioversion.  Comes back today. Here alone. Feels better. Breathing is ok. BP is good for the most part at home. She has no real complaint.   Past Medical History  Diagnosis Date  . Hypothyroidism, postsurgical   . Hypertension   . Glaucoma   . Cervical cancer   . Breast cancer     s/p right breast lumpectomy  . Thyroid cancer     s/p thyroidectomy  . Atrial fibrillation   . 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 Type II diabetes mellitus   . OSA on CPAP   . H/O hiatal hernia   . GERD (gastroesophageal reflux disease)   . Migraine     "last one was years ago" (03/31/2014)  . DJD (degenerative joint disease) of knee   . Arthritis     "all my joints" (03/31/2014)    Past  Surgical History  Procedure Laterality Date  . Thyroidectomy    . Tee without cardioversion N/A 03/30/2014    Procedure: TRANSESOPHAGEAL ECHOCARDIOGRAM (TEE);  Surgeon: [name], MD;  Location: [facility];  Service: Cardiovascular;  Laterality: N/A;  . Cardioversion N/A 03/30/2014    Procedure: CARDIOVERSION - BEDSIDE;  Surgeon: [name], MD;  Location: [facility];  Service: Cardiovascular;  Laterality: N/A;  . Tonsillectomy    . Excisional hemorrhoidectomy    . Carpal tunnel release Right   . Abdominal hysterectomy      "partial"  . Cataract extraction w/ intraocular lens  implant, bilateral Bilateral   . Breast biopsy Right   . Breast lumpectomy Right   . Cardioversion N/A 03/31/2014    Procedure: CARDIOVERSION  (BEDSIDE) ;  Surgeon: Lelon Perla, MD;  Location: [facility];  Service: Cardiovascular;  Laterality: N/A;  . Cardioversion N/A 04/27/2014    Procedure: CARDIOVERSION;  Surgeon: Darlin Coco, MD;  Location: [facility];  Service: Cardiovascular;  Laterality: N/A;  . Cardioversion N/A 04/13/2015    Procedure: CARDIOVERSION;  Surgeon: Thayer Headings, MD;  Location: [facility];  Service: Cardiovascular;  Laterality: N/A;     Medications: Current Outpatient Prescriptions  Medication Sig Dispense Refill  . amiodarone (PACERONE) 200 MG tablet Take 1 tablet (200 mg total) by mouth  daily.    . bimatoprost (LUMIGAN) 0.01 % SOLN Apply to eye. Eye drop    . Calcium-Phosphorus-Vitamin D (CITRACAL +D3 PO) Take 1 tablet by mouth every evening.    . Cyanocobalamin (B-12 PO) Take 1 tablet by mouth daily.    Marland Kitchen diltiazem (CARTIA XT) 240 MG 24 hr capsule Take 1 capsule (240 mg total) by mouth daily. 90 capsule 3  . ELIQUIS 5 MG TABS tablet Take 1 tablet by mouth two  times daily 180 tablet 1  . FeFum-FePo-FA-B Cmp-C-Zn-Mn-Cu (TANDEM PLUS) 162-115.2-1 MG CAPS Take 1 tablet by mouth daily as needed (only as needed).    . fluticasone (FLONASE) 50 MCG/ACT nasal spray Place 1 spray  into both nostrils daily.    . lansoprazole (PREVACID) 30 MG capsule Take 30 mg by mouth every morning.    Marland Kitchen levothyroxine (SYNTHROID, LEVOTHROID) 150 MCG tablet Take 150 mcg by mouth daily before breakfast.    . LORazepam (ATIVAN) 1 MG tablet Take 1.5 mg by mouth at bedtime.    Marland Kitchen losartan-hydrochlorothiazide (HYZAAR) 50-12.5 MG per tablet Take 1 tablet by mouth 2 (two) times daily. 180 tablet 3  . magnesium oxide (MAG-OX) 400 MG tablet Take 400 mg by mouth 2 (two) times daily.    . metFORMIN (GLUMETZA) 500 MG (MOD) 24 hr tablet Take 500-750 mg by mouth 2 (two) times daily with a meal. 750mg  in am, 500mg  in pm    . multivitamin-lutein (OCUVITE-LUTEIN) CAPS capsule Take 1 capsule by mouth daily.    . ONE TOUCH ULTRA TEST test strip     . ONETOUCH DELICA LANCETS 30Q MISC     . potassium chloride SA (K-DUR,KLOR-CON) 20 MEQ tablet Take 1 tablet by mouth  daily 90 tablet 1  . ranitidine (ZANTAC) 150 MG tablet Take 1 tablet (150 mg total) by mouth at bedtime. 30 tablet 6  . rosuvastatin (CRESTOR) 20 MG tablet Take 10 mg by mouth at bedtime.    . sertraline (ZOLOFT) 50 MG tablet Take 50 mg by mouth daily.     No current facility-administered medications for this visit.    Allergies: Allergies  Allergen Reactions  . Penicillins Itching and Rash    Social History: The patient  reports that she has never smoked. She has never used smokeless tobacco. She reports that she does not drink alcohol or use illicit drugs.   Family History: The patient's family history includes Diabetes in her brother, brother, brother, brother, brother, brother, brother, sister, sister, and sister; Heart attack in her brother; Heart disease in her brother, brother, brother, brother, brother, brother, and sister; Pancreatic cancer in her sister; Prostate cancer in her father.   Review of Systems: Please see the history of present illness.   Otherwise, the review of systems is positive for none.   All other systems are  reviewed and negative.   Physical Exam: VS:  BP 138/78 mmHg  Pulse 59  Ht 5' 5.5" (1.664 m)  Wt 204 lb (92.534 kg)  BMI 33.42 kg/m2 .  BMI Body mass index is 33.42 kg/(m^2).  Wt Readings from Last 3 Encounters:  05/14/15 204 lb (92.534 kg)  05/11/15 203 lb (92.08 kg)  04/09/15 200 lb 12.8 oz (91.082 kg)    General: Pleasant. Well developed, well nourished and in no acute distress.  HEENT: Normal. Neck: Supple, no JVD, carotid bruits, or masses noted.  Cardiac: Regular rate and rhythm. No murmurs, rubs, or gallops. No edema.  Respiratory:  Lungs are clear to auscultation  bilaterally with normal work of breathing.  GI: Soft and nontender.  MS: No deformity or atrophy. Gait and ROM intact. Skin: Warm and dry. Color is normal.  Neuro:  Strength and sensation are intact and no gross focal deficits noted.  Psych: Alert, appropriate and with normal affect.   LABORATORY DATA:  EKG:  EKG is ordered today. This demonstrates NSR - rate of 59.  Lab Results  Component Value Date   WBC 5.8 04/09/2015   HGB 13.1 04/09/2015   HCT 39.6 04/09/2015   PLT 197.0 04/09/2015   GLUCOSE 158* 04/09/2015   ALT 17 03/31/2015   AST 20 03/31/2015   NA 138 04/09/2015   K 4.4 04/09/2015   CL 101 04/09/2015   CREATININE 0.99 04/09/2015   BUN 20 04/09/2015   CO2 31 04/09/2015   TSH 2.11 03/31/2015   INR 1.09 03/28/2014    BNP (last 3 results) No results for input(s): BNP in the last 8760 hours.  ProBNP (last 3 results) No results for input(s): PROBNP in the last 8760 hours.   Other Studies Reviewed Today:  Echo Study Conclusions from 04/2015  - Left ventricle: The cavity size was normal. There was severe focal basal and moderate concentric hypertrophy of the left ventricle. Systolic function was vigorous. The estimated ejection fraction was in the range of 65% to 70%. Wall motion was normal; there were no regional wall motion abnormalities. Features are consistent with a  pseudonormal left ventricular filling pattern, with concomitant abnormal relaxation and increased filling pressure (grade 2 diastolic dysfunction). Doppler parameters are consistent with elevated ventricular end-diastolic filling pressure. - Aortic valve: There was mild regurgitation. - Aortic root: The aortic root was normal in size. - Left atrium: The atrium was moderately dilated. - Right ventricle: Systolic function was normal. - Right atrium: The atrium was normal in size. - Tricuspid valve: There was mild regurgitation. - Pulmonic valve: There was no regurgitation. - Pulmonary arteries: Systolic pressure was within the normal range. PA peak pressure: 31 mm Hg (S). - Inferior vena cava: The vessel was normal in size. - Pericardium, extracardiac: There was no pericardial effusion.   Procedure: DC Cardioversion Indications: atrial fib   Procedure Details Consent: Obtained Time Out: Verified patient identification, verified procedure, site/side was marked, verified correct patient position, special equipment/implants available, Radiology Safety Procedures followed, medications/allergies/relevent history reviewed, required imaging and test results available. Performed  The patient has been on adequate anticoagulation. The patient received IV Lidocaine 40 mg followed by Propofol 60 mg iv for sedation. Synchronous cardioversion was performed at 120 joules.  The cardioversion was successful.   Complications: No apparent complications Patient did tolerate procedure well.   Assessment/Plan: 1. Atrial fibrillation with rapid ventricular response: She has been cardioverted again - remains in NSR. Remains on Eliquis. She feels good clinically. I have left her on her current regimen.   2. Essential HTN: medications reviewed, continue current Rx. She has good control at home.   3. Chronic anticoagulation: tolerating without bleeding problems.  4. Diabetes, Type 2:  followed by PCP  5. HLD - on statin  6. Hypothyroidism - last TSH stable  Current medicines are reviewed with the patient today.  The patient does not have concerns regarding medicines other than what has been noted above.  The following changes have been made:  See above.  Labs/ tests ordered today include:    Orders Placed This Encounter  Procedures  . EKG 12-Lead     Disposition:   FU  with [name] as planned.   Patient is agreeable to this plan and will call if any problems develop in the interim.   Signed: Burtis Junes, RN, ANP-C 05/14/2015 9:21 AM  [facility] [address] [place], [place]  [address] Phone: [phone] Fax: [phone]

## 2015-05-14 NOTE — Patient Instructions (Addendum)
We will be checking the following labs today - NONE   Medication Instructions:    Continue with your current medicines.     Testing/Procedures To Be Arranged:  N/A  Follow-Up:   See [name] in 6 months  Keep OV with Dr. Laurann Montana as planned.     Other Special Instructions:   N/A  Call the [facility] office at [phone] if you have any questions, problems or concerns.

## 2015-05-20 ENCOUNTER — Encounter: Payer: Self-pay | Admitting: Gastroenterology

## 2015-06-09 ENCOUNTER — Encounter: Payer: Medicare Other | Admitting: Internal Medicine

## 2015-06-09 ENCOUNTER — Ambulatory Visit (AMBULATORY_SURGERY_CENTER): Payer: Medicare Other | Admitting: Internal Medicine

## 2015-06-09 ENCOUNTER — Encounter: Payer: Self-pay | Admitting: Internal Medicine

## 2015-06-09 VITALS — BP 140/58 | HR 57 | Temp 98.2°F | Resp 20 | Ht 65.5 in | Wt 203.0 lb

## 2015-06-09 DIAGNOSIS — K449 Diaphragmatic hernia without obstruction or gangrene: Secondary | ICD-10-CM

## 2015-06-09 DIAGNOSIS — K2270 Barrett's esophagus without dysplasia: Secondary | ICD-10-CM

## 2015-06-09 DIAGNOSIS — K208 Other esophagitis: Secondary | ICD-10-CM | POA: Diagnosis not present

## 2015-06-09 LAB — GLUCOSE, CAPILLARY
GLUCOSE-CAPILLARY: 115 mg/dL — AB (ref 65–99)
Glucose-Capillary: 114 mg/dL — ABNORMAL HIGH (ref 65–99)

## 2015-06-09 MED ORDER — SODIUM CHLORIDE 0.9 % IV SOLN: 500.0000 mL | INTRAVENOUS | Status: DC

## 2015-06-09 NOTE — Progress Notes (Signed)
Report to PACU, RN, vss, BBS= Clear.  

## 2015-06-09 NOTE — Op Note (Signed)
[facility]  [address]. [place], [address]   ENDOSCOPY PROCEDURE REPORT  PATIENT: [name], [name]  MR#: [id number] BIRTHDATE: [birth year] , 68  yrs. old GENDER: female ENDOSCOPIST: Gatha Mayer, MD, [facility] PROCEDURE DATE:  06/09/2015 PROCEDURE:  Esophagoscopy    + biopsy ASA CLASS:     Class III INDICATIONS:  surveillance.  Barrett's, GERD MEDICATIONS: Propofol 100 mg IV and Monitored anesthesia care TOPICAL ANESTHETIC: none  DESCRIPTION OF PROCEDURE: After the risks benefits and alternatives of the procedure were thoroughly explained, informed consent was obtained.  The LB [id number] endoscope was introduced through the mouth and advanced to the stomach antrum , Without limitations.  The instrument was slowly withdrawn as the mucosa was fully examined.    1) 3 columnar tongues seen at GE junction, all < 1 cm.  No nodules/ulcers.  Biosies taken. 2) Hiatal hernia 35-40 cm 3) Moderate non-erosive gastritis.  Retroflexed views revealed as previously described.     The scope was then withdrawn from the patient and the procedure completed.  COMPLICATIONS: There were no immediate complications.  ENDOSCOPIC IMPRESSION: 1) 3 columnar tongues seen at GE junction, all < 1 cm.  No nodules/ulcers.  Biosies taken. 2) Hiatal hernia 35-40 cm 3) Moderate non-erosive gastritis  RECOMMENDATIONS: 1.  Await pathology results 2.  Continue current meds and restart Eliquis today 3.  Doubt will need future routine endoscopy   eSigned:  Gatha Mayer, MD, [facility] 06/09/2015 1:24 PM    [id number] Laurann Montana, MD and The Patient

## 2015-06-09 NOTE — Patient Instructions (Addendum)
I took biopsies of the Barrett's esophagus - it looks ok but the biopsies will tell us more. You still have the hiatal hernia.  I appreciate the opportunity to care for you. Gatha Mayer, MD, FACG  YOU HAD AN ENDOSCOPIC PROCEDURE TODAY AT [facility]:   Refer to the procedure report that was given to you for any specific questions about what was found during the examination.  If the procedure report does not answer your questions, please call your gastroenterologist to clarify.  If you requested that your care partner not be given the details of your procedure findings, then the procedure report has been included in a sealed envelope for you to review at your convenience later.  YOU SHOULD EXPECT: Some feelings of bloating in the abdomen. Passage of more gas than usual.  Walking can help get rid of the air that was put into your GI tract during the procedure and reduce the bloating. If you had a lower endoscopy (such as a colonoscopy or flexible sigmoidoscopy) you may notice spotting of blood in your stool or on the toilet paper. If you underwent a bowel prep for your procedure, you may not have a normal bowel movement for a few days.  Please Note:  You might notice some irritation and congestion in your nose or some drainage.  This is from the oxygen used during your procedure.  There is no need for concern and it should clear up in a day or so.  SYMPTOMS TO REPORT IMMEDIATELY:   Following upper endoscopy (EGD)  Vomiting of blood or coffee ground material  New chest pain or pain under the shoulder blades  Painful or persistently difficult swallowing  New shortness of breath  Fever of 100F or higher  Black, tarry-looking stools  For urgent or emergent issues, a gastroenterologist can be reached at any hour by calling [phone].   DIET: Your first meal following the procedure should be a small meal and then it is ok to progress to your normal diet. Heavy or  fried foods are harder to digest and may make you feel nauseous or bloated.  Likewise, meals heavy in dairy and vegetables can increase bloating.  Drink plenty of fluids but you should avoid alcoholic beverages for 24 hours.  ACTIVITY:  You should plan to take it easy for the rest of today and you should NOT DRIVE or use heavy machinery until tomorrow (because of the sedation medicines used during the test).    FOLLOW UP: Our staff will call the number listed on your records the next business day following your procedure to check on you and address any questions or concerns that you may have regarding the information given to you following your procedure. If we do not reach you, we will leave a message.  However, if you are feeling well and you are not experiencing any problems, there is no need to return our call.  We will assume that you have returned to your regular daily activities without incident.  If any biopsies were taken you will be contacted by phone or by letter within the next 1-3 weeks.  Please call us at [phone] if you have not heard about the biopsies in 3 weeks.    SIGNATURES/CONFIDENTIALITY: You and/or your care partner have signed paperwork which will be entered into your electronic medical record.  These signatures attest to the fact that that the information above on your After Visit Summary has been reviewed  and is understood.  Full responsibility of the confidentiality of this discharge information lies with you and/or your care-partner.  Jerrye Bushy, gastritis-handouts given  Restart Eliquis today 01/09/15.

## 2015-06-10 ENCOUNTER — Telehealth: Payer: Self-pay | Admitting: *Deleted

## 2015-06-10 NOTE — Telephone Encounter (Signed)
  Follow up Call-  Call back number 06/09/2015  Post procedure Call Back phone  # [phone]  Permission to leave phone message Yes     Patient questions:  Do you have a fever, pain , or abdominal swelling? No. Pain Score  0 *  Have you tolerated food without any problems? Yes.    Have you been able to return to your normal activities? Yes.    Do you have any questions about your discharge instructions: Diet   No. Medications  No. Follow up visit  No.  Do you have questions or concerns about your Care? No.  Actions: * If pain score is 4 or above: No action needed, pain <4.

## 2015-06-15 ENCOUNTER — Encounter: Payer: Self-pay | Admitting: Internal Medicine

## 2015-06-15 NOTE — Progress Notes (Signed)
Quick Note:  Barrett's no dysplasia - no recall due to age ______

## 2015-06-16 ENCOUNTER — Other Ambulatory Visit: Payer: Self-pay | Admitting: Cardiovascular Disease

## 2015-08-11 ENCOUNTER — Ambulatory Visit (INDEPENDENT_AMBULATORY_CARE_PROVIDER_SITE_OTHER): Payer: Medicare Other | Admitting: Cardiovascular Disease

## 2015-08-11 ENCOUNTER — Encounter: Payer: Self-pay | Admitting: Cardiovascular Disease

## 2015-08-11 VITALS — BP 112/70 | HR 63 | Ht 66.5 in | Wt 203.0 lb

## 2015-08-11 DIAGNOSIS — I4891 Unspecified atrial fibrillation: Secondary | ICD-10-CM

## 2015-08-11 NOTE — Progress Notes (Signed)
Cardiology Office Note Date:  08/11/2015   ID:  [name], DOB 26-Dec-1930, MRN [id number]  PCP:  Irven Shelling, MD  Cardiologist:  Sherren Mocha, MD    Chief Complaint  Patient presents with  . Follow-up    atrial fibrillation    History of Present Illness: [name] is a 81 y.o. female who presents for follow-up of atrial fibrillation.  The patient has a history of paroxysmal atrial fibrillation. She has been anticoagulated with eliquis. She initially underwent cardioversion but reverted back to atrial fibrillation quickly. Amiodarone was started and a second cardioversion was successful approximately one year ago. She had a third episode of atrial fibrillation with RVR in May 2016 and underwent cardioversion once again. She has maintained sinus rhythm since that time. The patient is also been followed for hypertension and diabetes.   The patient is doing well. She has some mild leg swelling, but no other specific cardiac complaints. She denies chest pain, chest pressure, heart palpitations, shortness of breath, orthopnea, or PND. She's had no bleeding problems on chronic oral anticoagulation. She does complain of hair loss and she thinks this is from amiodarone. She also complains of unsteadiness on her feet.  Past Medical History  Diagnosis Date  . Hypothyroidism, postsurgical   . Hypertension   . Glaucoma   . Cervical cancer   . Breast cancer     s/p right breast lumpectomy  . Thyroid cancer     s/p thyroidectomy  . Atrial fibrillation   . 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 Type II diabetes mellitus   . OSA on CPAP   . H/O hiatal hernia   . GERD (gastroesophageal reflux disease)   . Migraine     "last one was years ago" (03/31/2014)  . DJD (degenerative joint disease) of knee   . Arthritis     "all  my joints" (03/31/2014)  . Allergy     SEASONAL  . Sleep apnea     Past Surgical History  Procedure Laterality Date  . Thyroidectomy    . Tee without cardioversion N/A 03/30/2014    Procedure: TRANSESOPHAGEAL ECHOCARDIOGRAM (TEE);  Surgeon: [name], MD;  Location: [place];  Service: Cardiovascular;  Laterality: N/A;  . Cardioversion N/A 03/30/2014    Procedure: CARDIOVERSION - BEDSIDE;  Surgeon: [name], MD;  Location: [place];  Service: Cardiovascular;  Laterality: N/A;  . Tonsillectomy    . Excisional hemorrhoidectomy    . Carpal tunnel release Right   . Abdominal hysterectomy      "partial"  . Cataract extraction w/ intraocular lens  implant, bilateral Bilateral   . Breast biopsy Right   . Breast lumpectomy Right   . Cardioversion N/A 03/31/2014    Procedure: CARDIOVERSION  (BEDSIDE) ;  Surgeon: Lelon Perla, MD;  Location: [place];  Service: Cardiovascular;  Laterality: N/A;  . Cardioversion N/A 04/27/2014    Procedure: CARDIOVERSION;  Surgeon: Darlin Coco, MD;  Location: [place];  Service: Cardiovascular;  Laterality: N/A;  . Cardioversion N/A 04/13/2015    Procedure: CARDIOVERSION;  Surgeon: Thayer Headings, MD;  Location: [facility] ENDOSCOPY;  Service: Cardiovascular;  Laterality: N/A;  . Colonoscopy    . Polypectomy      Current Outpatient Prescriptions  Medication Sig Dispense Refill  . bimatoprost (LUMIGAN) 0.01 % SOLN  Place 1 drop into both eyes at bedtime. Eye drop    . Calcium-Phosphorus-Vitamin D (CITRACAL +D3 PO) Take 1 tablet by mouth every evening.    . Cyanocobalamin (B-12 PO) Take 1 tablet by mouth daily.    Marland Kitchen diltiazem (CARTIA XT) 240 MG 24 hr capsule Take 1 capsule (240 mg total) by mouth daily. 90 capsule 3  . ELIQUIS 5 MG TABS tablet Take 1 tablet by mouth two  times daily 180 tablet 1  . FeFum-FePo-FA-B Cmp-C-Zn-Mn-Cu (TANDEM PLUS) 162-115.2-1 MG CAPS Take 1 tablet by mouth daily as needed (low iron).     . fluticasone (FLONASE) 50  MCG/ACT nasal spray Place 1 spray into both nostrils daily.    . lansoprazole (PREVACID) 30 MG capsule Take 30 mg by mouth every morning.    Marland Kitchen levothyroxine (SYNTHROID, LEVOTHROID) 150 MCG tablet Take 150 mcg by mouth daily before breakfast.    . LORazepam (ATIVAN) 1 MG tablet Take 1.5 mg by mouth at bedtime.    Marland Kitchen losartan-hydrochlorothiazide (HYZAAR) 50-12.5 MG per tablet Take 1 tablet by mouth 2 (two) times daily. 180 tablet 3  . magnesium oxide (MAG-OX) 400 MG tablet Take 400 mg by mouth 2 (two) times daily.    . metFORMIN (GLUMETZA) 500 MG (MOD) 24 hr tablet Take 500-750 mg by mouth 2 (two) times daily with a meal.     . multivitamin-lutein (OCUVITE-LUTEIN) CAPS capsule Take 1 capsule by mouth daily.    . ONE TOUCH ULTRA TEST test strip     . ONETOUCH DELICA LANCETS 93G MISC     . potassium chloride SA (K-DUR,KLOR-CON) 20 MEQ tablet Take 1 tablet by mouth  daily 90 tablet 1  . rosuvastatin (CRESTOR) 20 MG tablet Take 10 mg by mouth at bedtime.    . sertraline (ZOLOFT) 50 MG tablet Take 50 mg by mouth daily.    Marland Kitchen amiodarone (PACERONE) 200 MG tablet Take 1 tablet (200 mg total) by mouth daily.     No current facility-administered medications for this visit.    Allergies:   Penicillins   Social History:  The patient  reports that she has never smoked. She has never used smokeless tobacco. She reports that she does not drink alcohol or use illicit drugs.   Family History:  The patient's  family history includes Diabetes in her brother, brother, brother, brother, brother, brother, brother, sister, sister, and sister; Heart attack in her brother; Heart disease in her brother, brother, brother, brother, brother, brother, and sister; Pancreatic cancer in her sister; Prostate cancer in her father.    ROS:  Please see the history of present illness.  Otherwise, review of systems is positive for constipation.  All other systems are reviewed and negative.    PHYSICAL EXAM: VS:  BP 112/70 mmHg   Pulse 63  Ht 5' 6.5" (1.689 m)  Wt 203 lb (92.08 kg)  BMI 32.28 kg/m2 , BMI Body mass index is 32.28 kg/(m^2). GEN: Well nourished, well developed, in no acute distress HEENT: normal Neck: no JVD, no masses. No carotid bruits Cardiac: RRR with 3/6 systolic murmur at the RUSB, no diastolic murmur                Respiratory:  clear to auscultation bilaterally, normal work of breathing GI: soft, nontender, nondistended, + BS MS: no deformity or atrophy Ext: trace pretibial edema, pedal pulses 2+= bilaterally Skin: warm and dry, no rash Neuro:  Strength and sensation are intact Psych: euthymic mood, full affect  EKG:  EKG is not ordered today.  Recent Labs: 03/31/2015: ALT 17; TSH 2.11 04/09/2015: BUN 20; Creatinine, Ser 0.99; Hemoglobin 13.1; Platelets 197.0; Potassium 4.4; Sodium 138   Lipid Panel  No results found for: CHOL, TRIG, HDL, CHOLHDL, VLDL, LDLCALC, LDLDIRECT    Wt Readings from Last 3 Encounters:  08/11/15 203 lb (92.08 kg)  06/09/15 203 lb (92.08 kg)  05/14/15 204 lb (92.534 kg)     Cardiac Studies Reviewed: 2D Echo June 2016: - Left ventricle: The cavity size was normal. There was severe focal basal and moderate concentric hypertrophy of the left ventricle. Systolic function was vigorous. The estimated ejection fraction was in the range of 65% to 70%. Wall motion was normal; there were no regional wall motion abnormalities. Features are consistent with a pseudonormal left ventricular filling pattern, with concomitant abnormal relaxation and increased filling pressure (grade 2 diastolic dysfunction). Doppler parameters are consistent with elevated ventricular end-diastolic filling pressure. - Aortic valve: There was mild regurgitation. - Aortic root: The aortic root was normal in size. - Left atrium: The atrium was moderately dilated. - Right ventricle: Systolic function was normal. - Right atrium: The atrium was normal in size. - Tricuspid  valve: There was mild regurgitation. - Pulmonic valve: There was no regurgitation. - Pulmonary arteries: Systolic pressure was within the normal range. PA peak pressure: 31 mm Hg (S). - Inferior vena cava: The vessel was normal in size. - Pericardium, extracardiac: There was no pericardial effusion.  ASSESSMENT AND PLAN: 1.  Paroxysmal atrial fibrillation: The patient is maintaining sinus rhythm on amiodarone. She is highly symptomatic with atrial fibrillation. We discussed the pros and cons of amiodarone and she prefers to stay on this drug. We'll check thyroid and liver function studies in 6 months before her follow-up visit. Alternatively, labs could be drawn through her primary care physician's office if she prefers. Continue anticoagulation with Eliquis - pt denies bleeding problems.  2. Essential HTN: BP in good range. She reports higher readings on home monitor. May bring in for calibration. She plans to buy a new one.  3. Type II DM: followed by Dr Laurann Montana  4. Hyperlipidemia: on statin drug (crestor 20 mg)   Current medicines are reviewed with the patient today.  The patient does not have concerns regarding medicines.  Labs/ tests ordered today include:   Orders Placed This Encounter  Procedures  . Comp Met (CMET)  . TSH    Disposition:   FU 6 months  Signed, Sherren Mocha, MD  08/11/2015 3:46 PM    [facility] [address], [place], [place]  [address] Phone: [phone]; Fax: [phone]

## 2015-08-11 NOTE — Patient Instructions (Signed)
Medication Instructions:  None  Labwork: CMET and TSH prior to next visit  Testing/Procedures: None  Follow-Up: Your physician wants you to follow-up in: 6 months with [name]. You will receive a reminder letter in the mail two months in advance. If you don't receive a letter, please call our office to schedule the follow-up appointment.   Any Other Special Instructions Will Be Listed Below (If Applicable).

## 2015-08-13 ENCOUNTER — Other Ambulatory Visit: Payer: Self-pay

## 2015-08-13 DIAGNOSIS — I10 Essential (primary) hypertension: Secondary | ICD-10-CM

## 2015-08-13 MED ORDER — LOSARTAN POTASSIUM-HCTZ 50-12.5 MG PO TABS: 1.0000 | ORAL_TABLET | Freq: Two times a day (BID) | ORAL | Status: DC

## 2015-08-24 ENCOUNTER — Other Ambulatory Visit: Payer: Self-pay | Admitting: *Deleted

## 2015-08-24 DIAGNOSIS — I10 Essential (primary) hypertension: Secondary | ICD-10-CM

## 2015-08-24 MED ORDER — LOSARTAN POTASSIUM-HCTZ 50-12.5 MG PO TABS: 1.0000 | ORAL_TABLET | Freq: Two times a day (BID) | ORAL | Status: DC

## 2015-09-13 ENCOUNTER — Other Ambulatory Visit: Payer: Self-pay | Admitting: Cardiovascular Disease

## 2015-09-13 DIAGNOSIS — I4891 Unspecified atrial fibrillation: Secondary | ICD-10-CM

## 2015-09-13 NOTE — Telephone Encounter (Signed)
New message      Pt c/o medication issue:  1. Name of Medication: amiodarone  2. How are you currently taking this medication (dosage and times per day)? 200mg  daily 3. Are you having a reaction (difficulty breathing--STAT)? no 4. What is your medication issue? Pt states medication is making her hair fall out and causing her to be constipated.  Can she try the 100mg  daily

## 2015-09-13 NOTE — Telephone Encounter (Signed)
The pt is currently taking Amiodarone 200mg  daily and she would like to know if she can lower the dosage to 100mg  daily due to hair loss and constipation.  I will forward this message to Dr Burt Knack for review.

## 2015-09-15 MED ORDER — AMIODARONE HCL 100 MG PO TABS: 100.0000 mg | ORAL_TABLET | Freq: Every day | ORAL | Status: DC

## 2015-09-15 NOTE — Telephone Encounter (Signed)
Discussed with Dr Burt Knack and he is okay with the pt decreasing Amiodarone to 100mg  daily.  Pt aware and she would like a new Rx sent to the pharmacy.

## 2015-09-15 NOTE — Addendum Note (Signed)
Addended by: Barkley Boards on: 09/15/2015 04:49 PM   Modules accepted: Orders

## 2015-09-28 ENCOUNTER — Telehealth: Payer: Self-pay | Admitting: Nurse Practitioner

## 2015-09-28 ENCOUNTER — Encounter: Payer: Self-pay | Admitting: Nurse Practitioner

## 2015-09-28 ENCOUNTER — Ambulatory Visit (INDEPENDENT_AMBULATORY_CARE_PROVIDER_SITE_OTHER): Payer: Medicare Other | Admitting: Nurse Practitioner

## 2015-09-28 ENCOUNTER — Telehealth: Payer: Self-pay | Admitting: Cardiovascular Disease

## 2015-09-28 VITALS — BP 122/80 | HR 112 | Ht 66.0 in | Wt 204.8 lb

## 2015-09-28 DIAGNOSIS — Z79899 Other long term (current) drug therapy: Secondary | ICD-10-CM | POA: Diagnosis not present

## 2015-09-28 DIAGNOSIS — Z7901 Long term (current) use of anticoagulants: Secondary | ICD-10-CM | POA: Diagnosis not present

## 2015-09-28 DIAGNOSIS — I4891 Unspecified atrial fibrillation: Secondary | ICD-10-CM | POA: Diagnosis not present

## 2015-09-28 LAB — CBC
HCT: 37.4 % (ref 36.0–46.0)
Hemoglobin: 12.7 g/dL (ref 12.0–15.0)
MCH: 29 pg (ref 26.0–34.0)
MCHC: 34 g/dL (ref 30.0–36.0)
MCV: 85.4 fL (ref 78.0–100.0)
MPV: 10 fL (ref 8.6–12.4)
Platelets: 216 10*3/uL (ref 150–400)
RBC: 4.38 MIL/uL (ref 3.87–5.11)
RDW: 14.8 % (ref 11.5–15.5)
WBC: 6 10*3/uL (ref 4.0–10.5)

## 2015-09-28 LAB — TSH: TSH: 2.75 u[IU]/mL (ref 0.350–4.500)

## 2015-09-28 LAB — HEPATIC FUNCTION PANEL
ALT: 17 U/L (ref 6–29)
AST: 19 U/L (ref 10–35)
Albumin: 4 g/dL (ref 3.6–5.1)
Alkaline Phosphatase: 60 U/L (ref 33–130)
Bilirubin, Direct: 0.1 mg/dL (ref <=0.2)
Indirect Bilirubin: 0.4 mg/dL (ref 0.2–1.2)
Total Bilirubin: 0.5 mg/dL (ref 0.2–1.2)
Total Protein: 6.6 g/dL (ref 6.1–8.1)

## 2015-09-28 LAB — BASIC METABOLIC PANEL
BUN: 15 mg/dL (ref 7–25)
CO2: 27 mmol/L (ref 20–31)
Calcium: 9.4 mg/dL (ref 8.6–10.4)
Chloride: 101 mmol/L (ref 98–110)
Creat: 0.84 mg/dL (ref 0.60–0.88)
Glucose, Bld: 135 mg/dL — ABNORMAL HIGH (ref 65–99)
Potassium: 4.2 mmol/L (ref 3.5–5.3)
Sodium: 138 mmol/L (ref 135–146)

## 2015-09-28 MED ORDER — DILTIAZEM HCL ER COATED BEADS 360 MG PO CP24: 360.0000 mg | ORAL_CAPSULE | Freq: Every day | ORAL | Status: DC

## 2015-09-28 NOTE — Telephone Encounter (Signed)
New message      Pt states she went into afib yesterday.  Her HR was 113 yesterday and today it is 108.  She took 200mg  of amiodarone this am at 7am---she had been taking 100mg  for about 2 weeks.  Please advise.

## 2015-09-28 NOTE — Telephone Encounter (Signed)
I spoke with the [name] and she states she went into AFib yesterday.  The [name]'s pulse was 116 at that time.  Today the [name] sounds short of breath on the phone and her BP was 111/85 and pulse 108 at 7:50 AM. The [name] decreased her Amiodarone to 100mg  daily on 09/15/15 after speaking with our office.  Since then she had a root canal (week and a half ago) and saw the dentist yesterday and has infection in her gum.  The [name] was scheduled to have a procedure this afternoon to remove infection but she cancelled due to AFib.  The [name] denies missing any doses of Eliquis and she went back to Amiodarone 200mg  daily yesterday.  I discussed this [name] with Dr Burt Knack and he would like her seen in the office for further evaluation and possible rate control. [name] scheduled to see Truitt Merle NP today at 11:00.

## 2015-09-28 NOTE — Progress Notes (Signed)
CARDIOLOGY OFFICE NOTE  Date:  09/28/2015    [name] Date of Birth: 1931/01/08 Medical Record [id number]  PCP:  Irven Shelling, MD  Cardiologist:  Burt Knack    Chief Complaint  Patient presents with  . Atrial Fibrillation    Work in visit - seen for [name]    History of Present Illness: [name] is a 81 y.o. female who presents today for a work in visit. Seen for [name].  She is followed for atrial fibrillation. She has been anticoagulated with Eliquis. She underwent cardioversion after hospital admission for atrial fibrillation with RVR but she reverted back quickly to atrial fibrillation and was placed on amiodarone and cardioverted again.. Last cardioversion back in May of 2016. She is also followed for hypertension and diabetes.  Last seen in September - felt to be doing well. She was in NSR.   Phone call today - "I spoke with the pt and she states she went into AFib yesterday. The pt's pulse was 116 at that time. Today the pt sounds short of breath on the phone and her BP was 111/85 and pulse 108 at 7:50 AM. The pt decreased her Amiodarone to 100mg  daily on 09/15/15 after speaking with our office. Since then she had a root canal (week and a half ago) and saw the dentist yesterday and has infection in her gum. The pt was scheduled to have a procedure this afternoon to remove infection but she cancelled due to AFib. The pt denies missing any doses of Eliquis and she went back to Amiodarone 200mg  daily yesterday. I discussed this pt with Dr Burt Knack and he would like her seen in the office for further evaluation and possible rate control. Pt scheduled to see Truitt Merle NP today at 11:00"  Thus added to my schedule.   Comes back today. Here alone. Anxious. Notes that she went out of rhythm yesterday pm. Does not seem all that symptomatic - does feel her heart racing. Admits she gets anxious. She remains on Eliquis. She has an abscess in her mouth and  was planning to have what sounds like I&D for later today - but she cancelled. She was to have held her Eliquis for 2 doses - she did not hold.  Denies chest pain. Not dizzy. No syncope.  Breathing is unchanged. She had cut her amiodarone back a few weeks ago due to hair loss - she increased back up to 200 mg as of yesterday.   Past Medical History  Diagnosis Date  . Hypothyroidism, postsurgical   . Hypertension   . Glaucoma   . Cervical cancer ([place])   . Breast cancer [facility])     s/p right breast lumpectomy  . Thyroid cancer [facility])     s/p thyroidectomy  . Atrial fibrillation ([place])   . 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 Type II diabetes mellitus ([place])   . OSA on CPAP   . H/O hiatal hernia   . GERD (gastroesophageal reflux disease)   . Migraine     "last one was years ago" (03/31/2014)  . DJD (degenerative joint disease) of knee   . Arthritis     "all my joints" (03/31/2014)  . Allergy     SEASONAL  . Sleep apnea     Past Surgical History  Procedure  Laterality Date  . Thyroidectomy    . Tee without cardioversion N/A 03/30/2014    Procedure: TRANSESOPHAGEAL ECHOCARDIOGRAM (TEE);  Surgeon: [name], MD;  Location: [place];  Service: Cardiovascular;  Laterality: N/A;  . Cardioversion N/A 03/30/2014    Procedure: CARDIOVERSION - BEDSIDE;  Surgeon: [name], MD;  Location: [place];  Service: Cardiovascular;  Laterality: N/A;  . Tonsillectomy    . Excisional hemorrhoidectomy    . Carpal tunnel release Right   . Abdominal hysterectomy      "partial"  . Cataract extraction w/ intraocular lens  implant, bilateral Bilateral   . Breast biopsy Right   . Breast lumpectomy Right   . Cardioversion N/A 03/31/2014    Procedure: CARDIOVERSION  (BEDSIDE) ;  Surgeon: Lelon Perla, MD;  Location: [place];  Service: Cardiovascular;   Laterality: N/A;  . Cardioversion N/A 04/27/2014    Procedure: CARDIOVERSION;  Surgeon: Darlin Coco, MD;  Location: [place];  Service: Cardiovascular;  Laterality: N/A;  . Cardioversion N/A 04/13/2015    Procedure: CARDIOVERSION;  Surgeon: Thayer Headings, MD;  Location: [facility] ENDOSCOPY;  Service: Cardiovascular;  Laterality: N/A;  . Colonoscopy    . Polypectomy       Medications: Current Outpatient Prescriptions  Medication Sig Dispense Refill  . amiodarone (PACERONE) 200 MG tablet Take 200 mg by mouth daily.    . bimatoprost (LUMIGAN) 0.01 % SOLN Place 1 drop into both eyes at bedtime. Eye drop    . Calcium-Phosphorus-Vitamin D (CITRACAL +D3 PO) Take 1 tablet by mouth every evening.    . clindamycin (CLEOCIN) 150 MG capsule Take 150 mg by mouth 4 (four) times daily.     . Cyanocobalamin (B-12 PO) Take 1 tablet by mouth daily.    Marland Kitchen ELIQUIS 5 MG TABS tablet Take 1 tablet by mouth two  times daily 180 tablet 3  . FeFum-FePo-FA-B Cmp-C-Zn-Mn-Cu (TANDEM PLUS) 162-115.2-1 MG CAPS Take 1 tablet by mouth daily as needed (low iron).     . fluticasone (FLONASE) 50 MCG/ACT nasal spray Place 1 spray into both nostrils daily.    . lansoprazole (PREVACID) 30 MG capsule Take 30 mg by mouth every morning.    Marland Kitchen levothyroxine (SYNTHROID, LEVOTHROID) 150 MCG tablet Take 150 mcg by mouth daily before breakfast.    . LORazepam (ATIVAN) 1 MG tablet Take 1.5 mg by mouth at bedtime.    Marland Kitchen losartan-hydrochlorothiazide (HYZAAR) 50-12.5 MG tablet Take 1 tablet by mouth 2 (two) times daily. 180 tablet 2  . magnesium oxide (MAG-OX) 400 MG tablet Take 400 mg by mouth 2 (two) times daily.    . metFORMIN (GLUMETZA) 500 MG (MOD) 24 hr tablet Take 500-750 mg by mouth 2 (two) times daily with a meal.     . multivitamin-lutein (OCUVITE-LUTEIN) CAPS capsule Take 1 capsule by mouth daily.    . ONE TOUCH ULTRA TEST test strip     . ONETOUCH DELICA LANCETS 99991111 MISC     . potassium chloride SA (K-DUR,KLOR-CON) 20 MEQ  tablet Take 1 tablet by mouth  daily 90 tablet 1  . rosuvastatin (CRESTOR) 20 MG tablet Take 10 mg by mouth at bedtime.    . sertraline (ZOLOFT) 50 MG tablet Take 50 mg by mouth daily.    Marland Kitchen diltiazem (CARDIZEM CD) 360 MG 24 hr capsule Take 1 capsule (360 mg total) by mouth daily. 30 capsule 6   No current facility-administered medications for this visit.    Allergies: Allergies  Allergen Reactions  .  Penicillins Itching and Rash    Social History: The patient  reports that she has never smoked. She has never used smokeless tobacco. She reports that she does not drink alcohol or use illicit drugs.   Family History: The patient's family history includes Diabetes in her brother, brother, brother, brother, brother, brother, brother, sister, sister, and sister; Heart attack in her brother; Heart disease in her brother, brother, brother, brother, brother, brother, and sister; Pancreatic cancer in her sister; Prostate cancer in her father.   Review of Systems: Please see the history of present illness.   Otherwise, the review of systems is positive for none.   All other systems are reviewed and negative.   Physical Exam: VS:  BP 122/80 mmHg  Pulse 112  Ht 5\' 6"  (1.676 m)  Wt 204 lb 12.8 oz (92.897 kg)  BMI 33.07 kg/m2 .  BMI Body mass index is 33.07 kg/(m^2).  Wt Readings from Last 3 Encounters:  09/28/15 204 lb 12.8 oz (92.897 kg)  08/11/15 203 lb (92.08 kg)  06/09/15 203 lb (92.08 kg)    General: Pleasant. She is anxious. She is in no acute distress.  HEENT: Normal. Neck: Supple, no JVD, carotid bruits, or masses noted.  Cardiac: Irregular irregular rate and rhythm. No murmurs, rubs, or gallops. No edema.  Respiratory:  Lungs are clear to auscultation bilaterally with normal work of breathing.  GI: Soft and nontender.  MS: No deformity or atrophy. Gait and ROM intact. Skin: Warm and dry. Color is normal.  Neuro:  Strength and sensation are intact and no gross focal deficits  noted.  Psych: Alert, appropriate and with normal affect.   LABORATORY DATA:  EKG:  EKG is ordered today. This demonstrates AF with a VR of 112. Reviewed with [name].   Lab Results  Component Value Date   WBC 5.8 04/09/2015   HGB 13.1 04/09/2015   HCT 39.6 04/09/2015   PLT 197.0 04/09/2015   GLUCOSE 158* 04/09/2015   ALT 17 03/31/2015   AST 20 03/31/2015   NA 138 04/09/2015   K 4.4 04/09/2015   CL 101 04/09/2015   CREATININE 0.99 04/09/2015   BUN 20 04/09/2015   CO2 31 04/09/2015   TSH 2.11 03/31/2015   INR 1.09 03/28/2014    BNP (last 3 results) No results for input(s): BNP in the last 8760 hours.  ProBNP (last 3 results) No results for input(s): PROBNP in the last 8760 hours.   Other Studies Reviewed Today:  Echo Study Conclusions from 04/2015  - Left ventricle: The cavity size was normal. There was severe focal basal and moderate concentric hypertrophy of the left ventricle. Systolic function was vigorous. The estimated ejection fraction was in the range of 65% to 70%. Wall motion was normal; there were no regional wall motion abnormalities. Features are consistent with a pseudonormal left ventricular filling pattern, with concomitant abnormal relaxation and increased filling pressure (grade 2 diastolic dysfunction). Doppler parameters are consistent with elevated ventricular end-diastolic filling pressure. - Aortic valve: There was mild regurgitation. - Aortic root: The aortic root was normal in size. - Left atrium: The atrium was moderately dilated. - Right ventricle: Systolic function was normal. - Right atrium: The atrium was normal in size. - Tricuspid valve: There was mild regurgitation. - Pulmonic valve: There was no regurgitation. - Pulmonary arteries: Systolic pressure was within the normal range. PA peak pressure: 31 mm Hg (S). - Inferior vena cava: The vessel was normal in size. - Pericardium, extracardiac: There  was no  pericardial effusion.    Assessment/Plan: 1. Atrial fibrillation -  She has had 3 cardioversions. She has reverted back to AF. I do not get the sense that she is very symptomatic but she is anxious. She is on amiodarone - has increased her dose back to 200 mg a day but concerned about the hair loss. Needs I&D of abscess in her mouth. Discussed with [name] here in the office - we would like to hold off on repeat cardioversion (she would need uninterrupted Eliquis for one month preferably). Will rate control and increase the CCB today. Will refer to AF clinic to discuss other options Phyllis Ginger or ablation). She will need to proceed on with I&D of the abscess - we will only hold 1 dose of Eliquis. Will get her labs rechecked today.   2. Essential HTN: medications reviewed, continue current Rx.   3. Chronic anticoagulation: tolerating without bleeding problems.  4. Diabetes, Type 2: followed by PCP  5. HLD - on statin  6. Hypothyroidism - last TSH stable  Current medicines are reviewed with the patient today.  The patient does not have concerns regarding medicines other than what has been noted above.  The following changes have been made:  See above.  Labs/ tests ordered today include:    Orders Placed This Encounter  Procedures  . Basic metabolic panel  . CBC  . Hepatic function panel  . TSH  . EKG 12-Lead     Disposition:   FU with   Patient is agreeable to this plan and will call if any problems develop in the interim.   Signed: Burtis Junes, RN, ANP-C 09/28/2015 11:46 AM  [facility] [address] [place], [place]  [address] Phone: [phone] Fax: [phone]

## 2015-09-28 NOTE — Telephone Encounter (Signed)
New Message  Request for surgical clearance:  1. What type of surgery is being performed? Root canal   2. When is this surgery scheduled? 11 am   3. Are there any medications that need to be held prior to surgery and how long? How should she take the Eliquis    4. Name of physician performing surgery? Dr. Sue Lush with [facility]   5. What is your office phone and fax number? Phone number [phone] Fax : [phone]

## 2015-09-28 NOTE — Patient Instructions (Addendum)
We will be checking the following labs today - BMET, CBC, TSH, HPF   Medication Instructions:    Continue with your current medicines. BUT  I am changing your dose of Diltiazem to 360 mg to take once a day - this is at your drug store    Testing/Procedures To Be Arranged:  N/A  Follow-Up:   Referral to AF clinic   Other Special Instructions:   Get your appointment for the abscess rescheduled - we will only hold your Eliquis for ONE dose prior to this procedure. Call us and let us know when you will have this done.     If you need a refill on your cardiac medications before your next appointment, please call your pharmacy.   Call the [place] office at [phone] if you have any questions, problems or concerns.

## 2015-09-29 NOTE — Telephone Encounter (Signed)
S/w pt is having root canal today.  Held am dose of eliquis and will take pm dose tonight.  Stated to let dentist know that Eliquis was held this am and that there is going to be some bleeding.  Pt stated verbal understanding .

## 2015-09-30 ENCOUNTER — Other Ambulatory Visit: Payer: Self-pay | Admitting: *Deleted

## 2015-09-30 MED ORDER — AMIODARONE HCL 200 MG PO TABS: 200.0000 mg | ORAL_TABLET | Freq: Every day | ORAL | Status: DC

## 2015-10-06 ENCOUNTER — Other Ambulatory Visit: Payer: Self-pay | Admitting: Cardiovascular Disease

## 2015-10-06 DIAGNOSIS — I4891 Unspecified atrial fibrillation: Secondary | ICD-10-CM

## 2015-10-06 DIAGNOSIS — Z7901 Long term (current) use of anticoagulants: Secondary | ICD-10-CM

## 2015-10-06 DIAGNOSIS — Z79899 Other long term (current) drug therapy: Secondary | ICD-10-CM

## 2015-10-06 MED ORDER — DILTIAZEM HCL ER COATED BEADS 360 MG PO CP24: 360.0000 mg | ORAL_CAPSULE | Freq: Every day | ORAL | Status: DC

## 2015-10-06 MED ORDER — AMIODARONE HCL 200 MG PO TABS: 200.0000 mg | ORAL_TABLET | Freq: Every day | ORAL | Status: DC

## 2015-10-13 ENCOUNTER — Ambulatory Visit (HOSPITAL_COMMUNITY)
Admission: RE | Admit: 2015-10-13 | Discharge: 2015-10-13 | Disposition: A | Discharge status: Home / Self Care | Payer: Medicare Other | Source: Ambulatory Visit | Attending: Nurse Practitioner | Admitting: Nurse Practitioner

## 2015-10-13 VITALS — BP 146/82 | HR 65 | Ht 66.0 in | Wt 203.2 lb

## 2015-10-13 DIAGNOSIS — I10 Essential (primary) hypertension: Secondary | ICD-10-CM | POA: Diagnosis not present

## 2015-10-13 DIAGNOSIS — I4891 Unspecified atrial fibrillation: Secondary | ICD-10-CM | POA: Diagnosis not present

## 2015-10-13 NOTE — Progress Notes (Signed)
Patient ID: [name], female   DOB: 1930-11-27, 81 y.o.   MRN: [id number]     Primary Care Physician: Irven Shelling, MD Referring Physician: Truitt Merle, NP   [name] is a 81 y.o. female with a h/o persistent afib that is here today for f/u from a recent visit  with Truitt Merle, Np,  with afib with rvr. This was in the setting of a tooth abscess from which pt was taking antibiotics and had recently reduced her amiodarone to 100 mg a day for hair loss thought secondary to amiodarone. Amiodarone was returned to 200 mg a day and cardizem was increased for rate control and pt shortly returned to [place]. She went on to have her abscess treated with root canal. She has had several cardioversion's in the past with the last one being May of this year.  She is very happy today to be back in SR. We discussed options going forward, stopping amiodarone with necessary washout and loading of tikosyn. Ablation was mentioned but I am not sure she would be an optimal candidate, mostly due to age, but she states she would not like to get that invasive. Despite some hair loss, she is happy to continue amiodarone right now. She reduced her eliquis for one day for the recent tooth procedure but faithfully takes it as prescribed.  Today, she denies symptoms of palpitations, chest pain, shortness of breath, orthopnea, PND, lower extremity edema, dizziness, presyncope, syncope, or neurologic sequela. The patient is tolerating medications without difficulties and is otherwise without complaint today.   Past Medical History  Diagnosis Date  . Hypothyroidism, postsurgical   . Hypertension   . Glaucoma   . Cervical cancer ([place])   . Breast cancer [facility])     s/p right breast lumpectomy  . Thyroid cancer [facility])     s/p thyroidectomy  . Atrial fibrillation ([place])   . Hx of transesophageal echocardiography (TEE) for monitoring     TEE (03/2014): No LAA clot, moderate LAE, core triatriatum type structure in  RA with no stenosis, normal EF 55-60%, mild MR  . Type II diabetes mellitus ([place])   . OSA on CPAP   . H/O hiatal hernia   . GERD (gastroesophageal reflux disease)   . Migraine     "last one was years ago" (03/31/2014)  . DJD (degenerative joint disease) of knee   . Arthritis     "all my joints" (03/31/2014)  . Allergy     SEASONAL  . Sleep apnea    Past Surgical History  Procedure Laterality Date  . Thyroidectomy    . Tee without cardioversion N/A 03/30/2014    Procedure: TRANSESOPHAGEAL ECHOCARDIOGRAM (TEE);  Surgeon: [name], MD;  Location: [place];  Service: Cardiovascular;  Laterality: N/A;  . Cardioversion N/A 03/30/2014    Procedure: CARDIOVERSION - BEDSIDE;  Surgeon: [name], MD;  Location: [place];  Service: Cardiovascular;  Laterality: N/A;  . Tonsillectomy    . Excisional hemorrhoidectomy    . Carpal tunnel release Right   . Abdominal hysterectomy      "partial"  . Cataract extraction w/ intraocular lens  implant, bilateral Bilateral   . Breast biopsy Right   . Breast lumpectomy Right   . Cardioversion N/A 03/31/2014    Procedure: CARDIOVERSION  (BEDSIDE) ;  Surgeon: Lelon Perla, MD;  Location: [place];  Service: Cardiovascular;  Laterality: N/A;  . Cardioversion N/A 04/27/2014    Procedure: CARDIOVERSION;  Surgeon: Darlin Coco,  MD;  Location: [place];  Service: Cardiovascular;  Laterality: N/A;  . Cardioversion N/A 04/13/2015    Procedure: CARDIOVERSION;  Surgeon: Thayer Headings, MD;  Location: [place];  Service: Cardiovascular;  Laterality: N/A;  . Colonoscopy    . Polypectomy      Current Outpatient Prescriptions  Medication Sig Dispense Refill  . amiodarone (PACERONE) 200 MG tablet Take 1 tablet (200 mg total) by mouth daily. 90 tablet 3  . bimatoprost (LUMIGAN) 0.01 % SOLN Place 1 drop into both eyes at bedtime. Eye drop    . Calcium-Phosphorus-Vitamin D (CITRACAL +D3 PO) Take 1 tablet by mouth every evening.    . clindamycin  (CLEOCIN) 150 MG capsule Take 150 mg by mouth 4 (four) times daily.     . Cyanocobalamin (B-12 PO) Take 1 tablet by mouth daily.    Marland Kitchen diltiazem (CARDIZEM CD) 360 MG 24 hr capsule Take 1 capsule (360 mg total) by mouth daily. 30 capsule 6  . ELIQUIS 5 MG TABS tablet Take 1 tablet by mouth two  times daily 180 tablet 3  . FeFum-FePo-FA-B Cmp-C-Zn-Mn-Cu (TANDEM PLUS) 162-115.2-1 MG CAPS Take 1 tablet by mouth daily as needed (low iron).     . fluticasone (FLONASE) 50 MCG/ACT nasal spray Place 1 spray into both nostrils daily.    . lansoprazole (PREVACID) 30 MG capsule Take 30 mg by mouth every morning.    Marland Kitchen levothyroxine (SYNTHROID, LEVOTHROID) 150 MCG tablet Take 150 mcg by mouth daily before breakfast.    . LORazepam (ATIVAN) 1 MG tablet Take 1.5 mg by mouth at bedtime.    Marland Kitchen losartan-hydrochlorothiazide (HYZAAR) 50-12.5 MG tablet Take 1 tablet by mouth 2 (two) times daily. 180 tablet 2  . magnesium oxide (MAG-OX) 400 MG tablet Take 400 mg by mouth 2 (two) times daily.    . metFORMIN (GLUMETZA) 500 MG (MOD) 24 hr tablet Take 500-750 mg by mouth 2 (two) times daily with a meal.     . multivitamin-lutein (OCUVITE-LUTEIN) CAPS capsule Take 1 capsule by mouth daily.    . ONE TOUCH ULTRA TEST test strip     . ONETOUCH DELICA LANCETS 99991111 MISC     . potassium chloride SA (K-DUR,KLOR-CON) 20 MEQ tablet Take 1 tablet by mouth  daily 90 tablet 1  . rosuvastatin (CRESTOR) 20 MG tablet Take 10 mg by mouth at bedtime.    . sertraline (ZOLOFT) 50 MG tablet Take 50 mg by mouth daily.     No current facility-administered medications for this encounter.    Allergies  Allergen Reactions  . Penicillins Itching and Rash    Social History   Social History  . Marital Status: Divorced    Spouse Name: N/A  . Number of Children: 2  . Years of Education: N/A   Occupational History  . Retired    Social History Main Topics  . Smoking status: Never Smoker   . Smokeless tobacco: Never Used  . Alcohol Use:  No  . Drug Use: No  . Sexual Activity: No   Other Topics Concern  . Not on file   Social History Narrative    Family History  Problem Relation Age of Onset  . Prostate cancer Father   . Diabetes Sister   . Heart disease Sister   . Heart attack Brother   . Heart disease Brother   . Diabetes Brother   . Diabetes Sister   . Diabetes Sister   . Pancreatic cancer Sister   . Diabetes Brother   .  Heart disease Brother   . Diabetes Brother   . Heart disease Brother   . Diabetes Brother   . Heart disease Brother   . Diabetes Brother   . Heart disease Brother   . Diabetes Brother   . Heart disease Brother   . Diabetes Brother     ROS- All systems are reviewed and negative except as per the HPI above  Physical Exam: Filed Vitals:   10/13/15 1045  BP: 146/82  Pulse: 65  Height: 5\' 6"  (1.676 m)  Weight: 203 lb 3.2 oz (92.171 kg)    GEN- The patient is well appearing, alert and oriented x 3 today.   Head- normocephalic, atraumatic Eyes-  Sclera clear, conjunctiva pink Ears- hearing intact Oropharynx- clear Neck- supple, no JVP Lymph- no cervical lymphadenopathy Lungs- Clear to ausculation bilaterally, normal work of breathing Heart- Regular rate and rhythm, no murmurs, rubs or gallops, PMI not laterally displaced GI- soft, NT, ND, + BS Extremities- no clubbing, cyanosis, or edema MS- no significant deformity or atrophy Skin- no rash or lesion Psych- euthymic mood, full affect Neuro- strength and sensation are intact  EKG-NSR, normal EKG, Pr int 190 ms, QRS int 94 ms, Qtc 456 ms  Echo-Left ventricle: The cavity size was normal. There was severe focal basal and moderate concentric hypertrophy of the left ventricle. Systolic function was vigorous. The estimated ejection fraction was in the range of 65% to 70%. Wall motion was normal; there were no regional wall motion abnormalities. Features are consistent with a pseudonormal left ventricular filling  pattern, with concomitant abnormal relaxation and increased filling pressure (grade 2 diastolic dysfunction). Doppler parameters are consistent with elevated ventricular end-diastolic filling pressure. - Aortic valve: There was mild regurgitation. - Aortic root: The aortic root was normal in size. - Left atrium: The atrium was moderately dilated. - Right ventricle: Systolic function was normal. - Right atrium: The atrium was normal in size. - Tricuspid valve: There was mild regurgitation. - Pulmonic valve: There was no regurgitation. - Pulmonary arteries: Systolic pressure was within the normal range. PA peak pressure: 31 mm Hg (S). - Inferior vena cava: The vessel was normal in size. - Pericardium, extracardiac: There was no pericardial effusion.  Epic records reviewed   Assessment and Plan: 1. Afib Pt has returned to SR with increase of amiodarone to 200 mg a day and increasing cardizem to 360 mg a day. She is currently happy with her afib management despite some hair loss. We did discuss option of tiksoyn if needed and I am not sure if she would be a prime candidate for ablation due to age. TSH/liver recently checked and normal. Continue eliquis  2. HTN Stable Rechecked by me 140/86   F/u with [name] per recall in March, and afib clinic as needed  Geroge Baseman. Celestia Duva, [facility] [address] [place], [place] [address] [phone]

## 2015-10-19 ENCOUNTER — Other Ambulatory Visit: Payer: Self-pay | Admitting: *Deleted

## 2015-10-19 MED ORDER — AMIODARONE HCL 200 MG PO TABS: 200.0000 mg | ORAL_TABLET | Freq: Every day | ORAL | Status: DC

## 2015-12-29 ENCOUNTER — Other Ambulatory Visit: Payer: Self-pay

## 2015-12-29 DIAGNOSIS — Z7901 Long term (current) use of anticoagulants: Secondary | ICD-10-CM

## 2015-12-29 DIAGNOSIS — Z79899 Other long term (current) drug therapy: Secondary | ICD-10-CM

## 2015-12-29 DIAGNOSIS — I4891 Unspecified atrial fibrillation: Secondary | ICD-10-CM

## 2015-12-29 MED ORDER — DILTIAZEM HCL ER COATED BEADS 360 MG PO CP24: 360.0000 mg | ORAL_CAPSULE | Freq: Every day | ORAL | Status: DC

## 2016-01-06 ENCOUNTER — Telehealth: Payer: Self-pay | Admitting: Cardiovascular Disease

## 2016-01-06 NOTE — Telephone Encounter (Signed)
It's just fine for her to have a cortisone shot - no interactions with her other medications.

## 2016-01-06 NOTE — Telephone Encounter (Signed)
Per [name] states can have the cortisone shot.  Advised [name] that she may have shot.  She states it is very painful and is grateful that is OK to have injection.

## 2016-01-06 NOTE — Telephone Encounter (Signed)
Will forward to [employer]

## 2016-01-06 NOTE — Telephone Encounter (Signed)
New message      Pt has AFIB--not in it now.  She has bursitis in her right hip and is seeing the orthopedic on Monday.  If he want to give her a cortisone shot, will it be ok?

## 2016-02-07 ENCOUNTER — Other Ambulatory Visit: Payer: Self-pay | Admitting: Orthopedic Surgery

## 2016-02-07 DIAGNOSIS — M545 Low back pain: Secondary | ICD-10-CM

## 2016-02-13 ENCOUNTER — Ambulatory Visit
Admission: RE | Admit: 2016-02-13 | Discharge: 2016-02-13 | Disposition: A | Discharge status: Home / Self Care | Payer: Medicare Other | Source: Ambulatory Visit | Attending: Orthopedic Surgery | Admitting: Orthopedic Surgery

## 2016-02-13 DIAGNOSIS — M545 Low back pain: Secondary | ICD-10-CM

## 2016-02-15 ENCOUNTER — Other Ambulatory Visit: Payer: Self-pay | Admitting: Cardiovascular Disease

## 2016-02-16 ENCOUNTER — Other Ambulatory Visit (INDEPENDENT_AMBULATORY_CARE_PROVIDER_SITE_OTHER): Payer: Medicare Other | Admitting: *Deleted

## 2016-02-16 ENCOUNTER — Other Ambulatory Visit: Payer: Self-pay | Admitting: *Deleted

## 2016-02-16 DIAGNOSIS — I4891 Unspecified atrial fibrillation: Secondary | ICD-10-CM

## 2016-02-16 DIAGNOSIS — E876 Hypokalemia: Secondary | ICD-10-CM

## 2016-02-16 LAB — COMPREHENSIVE METABOLIC PANEL WITH GFR
ALT: 13 U/L (ref 6–29)
AST: 14 U/L (ref 10–35)
Albumin: 4 g/dL (ref 3.6–5.1)
Alkaline Phosphatase: 63 U/L (ref 33–130)
BUN: 19 mg/dL (ref 7–25)
CO2: 28 mmol/L (ref 20–31)
Calcium: 9.1 mg/dL (ref 8.6–10.4)
Chloride: 100 mmol/L (ref 98–110)
Creat: 0.98 mg/dL — ABNORMAL HIGH (ref 0.60–0.88)
Glucose, Bld: 109 mg/dL — ABNORMAL HIGH (ref 65–99)
Potassium: 3.4 mmol/L — ABNORMAL LOW (ref 3.5–5.3)
Sodium: 140 mmol/L (ref 135–146)
Total Bilirubin: 0.5 mg/dL (ref 0.2–1.2)
Total Protein: 6.2 g/dL (ref 6.1–8.1)

## 2016-02-16 LAB — TSH: TSH: 2.84 m[IU]/L

## 2016-02-16 MED ORDER — POTASSIUM CHLORIDE CRYS ER 20 MEQ PO TBCR
40.0000 meq | EXTENDED_RELEASE_TABLET | Freq: Every day | ORAL | Status: DC
Start: 2016-02-16 — End: 2016-03-17

## 2016-02-16 NOTE — Addendum Note (Signed)
Addended by: Eulis Foster on: 02/16/2016 10:51 AM   Modules accepted: Orders

## 2016-02-16 NOTE — Addendum Note (Signed)
Addended by: Eulis Foster on: 02/16/2016 10:50 AM   Modules accepted: Orders

## 2016-02-16 NOTE — Addendum Note (Signed)
Addended by: Eulis Foster on: 02/16/2016 10:49 AM   Modules accepted: Orders

## 2016-02-18 ENCOUNTER — Ambulatory Visit (INDEPENDENT_AMBULATORY_CARE_PROVIDER_SITE_OTHER): Payer: Medicare Other | Admitting: Cardiovascular Disease

## 2016-02-18 ENCOUNTER — Encounter: Payer: Self-pay | Admitting: Cardiovascular Disease

## 2016-02-18 ENCOUNTER — Telehealth: Payer: Self-pay | Admitting: Cardiovascular Disease

## 2016-02-18 VITALS — BP 132/80 | HR 68 | Ht 66.5 in | Wt 203.0 lb

## 2016-02-18 DIAGNOSIS — I10 Essential (primary) hypertension: Secondary | ICD-10-CM | POA: Diagnosis not present

## 2016-02-18 DIAGNOSIS — I480 Paroxysmal atrial fibrillation: Secondary | ICD-10-CM

## 2016-02-18 NOTE — Telephone Encounter (Signed)
This was addressed during the pt's OV today.  The pt was cleared to hold Eliquis 2 days prior to injection.  Copy of paper clearance given to the pt and this form was faxed to [phone].

## 2016-02-18 NOTE — Telephone Encounter (Signed)
New message  Request for surgical clearance:  What type of surgery is being performed? Spinal Injection  When is this surgery scheduled? Not scheduled yet   Are there any medications that need to be held prior to surgery and how long? Eliquis.. How long should she hold the Eliquis?   Name of physician performing surgery? Dr. Ernestina Patches   What is your office phone and fax number? [employer] [phone] No fax listed per pt  Pt will have [facility] call and req a clearance as well

## 2016-02-18 NOTE — Progress Notes (Signed)
Cardiology Office Note Date:  02/18/2016   ID:  [name], DOB [birth year], MRN [id number]  PCP:  Irven Shelling, MD  Cardiologist:  Sherren Mocha, MD    Chief Complaint  Patient presents with  . Atrial Fibrillation    denies cp, sob, le edema, or claudication  . Hypertension     History of Present Illness: [name] is a 82 y.o. female who presents for follow-up of atrial fibrillation.  She's had a lot of problems related to paroxysmal atrial fibrillation. The patient is highly symptomatic when she goes into atrial fib. She has required multiple cardioversions. Most recently she has done quite well on a combination of amiodarone and high-dose diltiazem. She feels like her heart rhythm has been very good over the last few months. She denies any recent heart palpitations, shortness of breath, chest pain, lightheadedness, or syncope. She has been compliant with her medicines. She denies any bleeding problems on Eliquis. She's developed recent problems with her back and she will have an injection in the near future. She needs to hold Eliquis for 2 days prior to her spinal injection.   Past Medical History  Diagnosis Date  . Hypothyroidism, postsurgical   . Hypertension   . Glaucoma   . Cervical cancer ([place])   . Breast cancer [facility])     s/p right breast lumpectomy  . Thyroid cancer [facility])     s/p thyroidectomy  . Atrial fibrillation ([place])   . 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 Type II diabetes mellitus ([place])   . OSA on CPAP   . H/O hiatal hernia   . GERD (gastroesophageal reflux disease)   . Migraine     "last one was years ago" (03/31/2014)  . DJD (degenerative joint disease) of knee   . Arthritis     "all my joints" (03/31/2014)  . Allergy     SEASONAL  . Sleep apnea     Past Surgical History  Procedure  Laterality Date  . Thyroidectomy    . Tee without cardioversion N/A 03/30/2014    Procedure: TRANSESOPHAGEAL ECHOCARDIOGRAM (TEE);  Surgeon: [name], MD;  Location: [place];  Service: Cardiovascular;  Laterality: N/A;  . Cardioversion N/A 03/30/2014    Procedure: CARDIOVERSION - BEDSIDE;  Surgeon: [name], MD;  Location: [place];  Service: Cardiovascular;  Laterality: N/A;  . Tonsillectomy    . Excisional hemorrhoidectomy    . Carpal tunnel release Right   . Abdominal hysterectomy      "partial"  . Cataract extraction w/ intraocular lens  implant, bilateral Bilateral   . Breast biopsy Right   . Breast lumpectomy Right   . Cardioversion N/A 03/31/2014    Procedure: CARDIOVERSION  (BEDSIDE) ;  Surgeon: Lelon Perla, MD;  Location: [place];  Service: Cardiovascular;  Laterality: N/A;  . Cardioversion N/A 04/27/2014    Procedure: CARDIOVERSION;  Surgeon: Darlin Coco, MD;  Location: [place];  Service: Cardiovascular;  Laterality: N/A;  . Cardioversion N/A 04/13/2015    Procedure: CARDIOVERSION;  Surgeon: Thayer Headings, MD;  [facility];  Service: Cardiovascular;  Laterality: N/A;  . Colonoscopy    . Polypectomy      Current Outpatient Prescriptions  Medication Sig Dispense Refill  . amiodarone (PACERONE) 200 MG tablet Take 1 tablet (200 mg total) by  mouth daily. 90 tablet 3  . bimatoprost (LUMIGAN) 0.01 % SOLN Place 1 drop into both eyes at bedtime. Eye drop    . Calcium-Phosphorus-Vitamin D (CITRACAL +D3 PO) Take 1 tablet by mouth every evening.    . Cyanocobalamin (B-12 PO) Take 1 tablet by mouth daily.    Marland Kitchen diltiazem (CARDIZEM CD) 360 MG 24 hr capsule Take 1 capsule (360 mg total) by mouth daily. 90 capsule 1  . ELIQUIS 5 MG TABS tablet Take 1 tablet by mouth two  times daily 180 tablet 3  . FeFum-FePo-FA-B Cmp-C-Zn-Mn-Cu (TANDEM PLUS) 162-115.2-1 MG CAPS Take 1 tablet by mouth daily as needed (low iron).     . fluticasone (FLONASE) 50 MCG/ACT nasal  spray Place 1 spray into both nostrils daily.    . lansoprazole (PREVACID) 30 MG capsule Take 30 mg by mouth every morning.    Marland Kitchen levothyroxine (SYNTHROID, LEVOTHROID) 150 MCG tablet Take 150 mcg by mouth daily before breakfast.    . LORazepam (ATIVAN) 1 MG tablet Take 1.5 mg by mouth at bedtime.    Marland Kitchen losartan-hydrochlorothiazide (HYZAAR) 50-12.5 MG tablet Take 1 tablet by mouth 2 (two) times daily. 180 tablet 2  . magnesium oxide (MAG-OX) 400 MG tablet Take 400 mg by mouth 2 (two) times daily.    . metFORMIN (GLUMETZA) 500 MG (MOD) 24 hr tablet Take 500-750 mg by mouth 2 (two) times daily with a meal.     . multivitamin-lutein (OCUVITE-LUTEIN) CAPS capsule Take 1 capsule by mouth daily.    . ONE TOUCH ULTRA TEST test strip     . ONETOUCH DELICA LANCETS 99991111 MISC     . potassium chloride SA (K-DUR,KLOR-CON) 20 MEQ tablet Take 2 tablets (40 mEq total) by mouth daily. 90 tablet 1  . rosuvastatin (CRESTOR) 20 MG tablet Take 10 mg by mouth at bedtime.    . sertraline (ZOLOFT) 50 MG tablet Take 50 mg by mouth daily.     No current facility-administered medications for this visit.    Allergies:   Penicillins   Social History:  The patient  reports that she has never smoked. She has never used smokeless tobacco. She reports that she does not drink alcohol or use illicit drugs.   Family History:  The patient's  family history includes Diabetes in her brother, brother, brother, brother, brother, brother, brother, sister, sister, and sister; Heart attack in her brother; Heart disease in her brother, brother, brother, brother, brother, brother, and sister; Pancreatic cancer in her sister; Prostate cancer in her father.    ROS:  Please see the history of present illness.  Otherwise, review of systems is positive for back pain, muscle pain, hip pain, balance problems.  All other systems are reviewed and negative.    PHYSICAL EXAM: VS:  BP 132/80 mmHg  Pulse 68  Ht 5' 6.5" (1.689 m)  Wt 203 lb (92.08  kg)  BMI 32.28 kg/m2 , BMI Body mass index is 32.28 kg/(m^2). GEN: Well nourished, well developed, in no acute distress HEENT: normal Neck: no JVD, no masses. No carotid bruits Cardiac: RRR with 2/6 systolic murmur at the LLSB               Respiratory:  clear to auscultation bilaterally, normal work of breathing GI: soft, nontender, nondistended, + BS MS: no deformity or atrophy Ext: 1+ bilateral pretibial edema Skin: warm and dry, no rash Neuro:  Strength and sensation are intact Psych: euthymic mood, full affect  EKG:  EKG is ordered  today. The ekg ordered today shows normal sinus rhythm 68 bpm, possible inferior infarct age undetermined, possible anterolateral infarct age undetermined,no significant change from previous tracings.  Recent Labs: 09/28/2015: Hemoglobin 12.7; Platelets 216 02/16/2016: ALT 13; BUN 19; Creat 0.98*; Potassium 3.4*; Sodium 140; TSH 2.84   Lipid Panel  No results found for: CHOL, TRIG, HDL, CHOLHDL, VLDL, LDLCALC, LDLDIRECT    Wt Readings from Last 3 Encounters:  02/18/16 203 lb (92.08 kg)  10/13/15 203 lb 3.2 oz (92.171 kg)  09/28/15 204 lb 12.8 oz (92.897 kg)   Cardiac Studies Reviewed: 2-D echocardiogram 04/27/2015: Study Conclusions  - Left ventricle: The cavity size was normal. There was severe  focal basal and moderate concentric hypertrophy of the left  ventricle. Systolic function was vigorous. The estimated ejection  fraction was in the range of 65% to 70%. Wall motion was normal;  there were no regional wall motion abnormalities. Features are  consistent with a pseudonormal left ventricular filling pattern,  with concomitant abnormal relaxation and increased filling  pressure (grade 2 diastolic dysfunction). Doppler parameters are  consistent with elevated ventricular end-diastolic filling  pressure. - Aortic valve: There was mild regurgitation. - Aortic root: The aortic root was normal in size. - Left atrium: The atrium was  moderately dilated. - Right ventricle: Systolic function was normal. - Right atrium: The atrium was normal in size. - Tricuspid valve: There was mild regurgitation. - Pulmonic valve: There was no regurgitation. - Pulmonary arteries: Systolic pressure was within the normal  range. PA peak pressure: 31 mm Hg (S). - Inferior vena cava: The vessel was normal in size. - Pericardium, extracardiac: There was no pericardial effusion.   ASSESSMENT AND PLAN: 1.  Paroxysmal atrial fibrillation: Maintaining sinus rhythm on amiodarone. Recent labs reviewed. Tolerating anticoagulation without bleeding problems. I will see her back in 6 months. She was cleared for holding anticoagulation for 48 hours prior to spinal injection.  2. Essential hypertension: Blood pressure well controlled on current medical program.  3. Type 2 diabetes: Followed by primary care  4. Hyperlipidemia: Treated with Crestor  Current medicines are reviewed with the patient today.  The patient does not have concerns regarding medicines.  Labs/ tests ordered today include:  No orders of the defined types were placed in this encounter.    Disposition:   FU 6 months  Signed, Sherren Mocha, MD  02/18/2016 3:32 PM    [facility] [place], [place], [place]  [address] Phone: [phone]; Fax: [phone]

## 2016-02-18 NOTE — Patient Instructions (Signed)

## 2016-02-21 ENCOUNTER — Other Ambulatory Visit: Payer: Self-pay | Admitting: Orthopedic Surgery

## 2016-02-21 DIAGNOSIS — M545 Low back pain, unspecified: Secondary | ICD-10-CM

## 2016-02-21 DIAGNOSIS — G8929 Other chronic pain: Secondary | ICD-10-CM

## 2016-02-25 ENCOUNTER — Ambulatory Visit
Admission: RE | Admit: 2016-02-25 | Discharge: 2016-02-25 | Disposition: A | Discharge status: Home / Self Care | Payer: Medicare Other | Source: Ambulatory Visit | Attending: Orthopedic Surgery | Admitting: Orthopedic Surgery

## 2016-02-25 ENCOUNTER — Encounter: Payer: Self-pay | Admitting: Radiology

## 2016-02-25 DIAGNOSIS — C50919 Malignant neoplasm of unspecified site of unspecified female breast: Secondary | ICD-10-CM | POA: Insufficient documentation

## 2016-02-25 DIAGNOSIS — H509 Unspecified strabismus: Secondary | ICD-10-CM | POA: Insufficient documentation

## 2016-02-25 DIAGNOSIS — M545 Low back pain, unspecified: Secondary | ICD-10-CM

## 2016-02-25 DIAGNOSIS — H47019 Ischemic optic neuropathy, unspecified eye: Secondary | ICD-10-CM

## 2016-02-25 DIAGNOSIS — G8929 Other chronic pain: Secondary | ICD-10-CM

## 2016-02-25 HISTORY — DX: Ischemic optic neuropathy, unspecified eye: H47.019

## 2016-02-25 HISTORY — DX: Malignant neoplasm of unspecified site of unspecified female breast: C50.919

## 2016-02-25 HISTORY — DX: Unspecified strabismus: H50.9

## 2016-02-25 MED ORDER — METHYLPREDNISOLONE ACETATE 40 MG/ML INJ SUSP (RADIOLOG
120.0000 mg | Freq: Once | INTRAMUSCULAR | Status: AC
  Administered 2016-02-25: 120 mg via EPIDURAL

## 2016-02-25 MED ORDER — IOHEXOL 180 MG/ML  SOLN
1.0000 mL | Freq: Once | INTRAMUSCULAR | Status: AC | PRN
  Administered 2016-02-25: 1 mL via EPIDURAL

## 2016-02-25 NOTE — Discharge Instructions (Signed)

## 2016-03-14 ENCOUNTER — Other Ambulatory Visit (INDEPENDENT_AMBULATORY_CARE_PROVIDER_SITE_OTHER): Payer: Medicare Other

## 2016-03-14 DIAGNOSIS — E876 Hypokalemia: Secondary | ICD-10-CM | POA: Diagnosis not present

## 2016-03-14 LAB — BASIC METABOLIC PANEL
BUN: 17 mg/dL (ref 7–25)
CO2: 26 mmol/L (ref 20–31)
Calcium: 9.2 mg/dL (ref 8.6–10.4)
Chloride: 101 mmol/L (ref 98–110)
Creat: 0.87 mg/dL (ref 0.60–0.88)
Glucose, Bld: 95 mg/dL (ref 65–99)
Potassium: 3.4 mmol/L — ABNORMAL LOW (ref 3.5–5.3)
Sodium: 138 mmol/L (ref 135–146)

## 2016-03-17 ENCOUNTER — Telehealth: Payer: Self-pay | Admitting: Cardiovascular Disease

## 2016-03-17 DIAGNOSIS — E876 Hypokalemia: Secondary | ICD-10-CM

## 2016-03-17 MED ORDER — POTASSIUM CHLORIDE CRYS ER 20 MEQ PO TBCR: 60.0000 meq | EXTENDED_RELEASE_TABLET | Freq: Every day | ORAL | Status: DC

## 2016-03-17 NOTE — Telephone Encounter (Signed)
Notes Recorded by Burtis Junes, NP on 03/15/2016 at 7:39 AM Ok to report. Potassium still a little on the low side - needs to increase her potassium to 3 pills a day BMET in one week.   pt aware of results  Lab orders placed for repeat in one week

## 2016-03-17 NOTE — Telephone Encounter (Signed)
Follow Up  Pt called for results

## 2016-03-21 ENCOUNTER — Other Ambulatory Visit: Payer: Self-pay | Admitting: Neurosurgery

## 2016-03-24 ENCOUNTER — Other Ambulatory Visit (INDEPENDENT_AMBULATORY_CARE_PROVIDER_SITE_OTHER): Payer: Medicare Other | Admitting: *Deleted

## 2016-03-24 ENCOUNTER — Other Ambulatory Visit: Payer: Self-pay | Admitting: *Deleted

## 2016-03-24 DIAGNOSIS — E876 Hypokalemia: Secondary | ICD-10-CM

## 2016-03-24 LAB — BASIC METABOLIC PANEL
BUN: 13 mg/dL (ref 7–25)
CO2: 29 mmol/L (ref 20–31)
Calcium: 9.4 mg/dL (ref 8.6–10.4)
Chloride: 103 mmol/L (ref 98–110)
Creat: 0.99 mg/dL — ABNORMAL HIGH (ref 0.60–0.88)
Glucose, Bld: 82 mg/dL (ref 65–99)
Potassium: 3.6 mmol/L (ref 3.5–5.3)
Sodium: 142 mmol/L (ref 135–146)

## 2016-03-24 MED ORDER — POTASSIUM CHLORIDE CRYS ER 20 MEQ PO TBCR: 60.0000 meq | EXTENDED_RELEASE_TABLET | Freq: Every day | ORAL | Status: DC

## 2016-03-24 NOTE — Addendum Note (Signed)
Addended by: Eulis Foster on: 03/24/2016 09:44 AM   Modules accepted: Orders

## 2016-04-11 NOTE — Pre-Procedure Instructions (Signed)
[name]  04/11/2016      [facility] SERVICE - [place], [place] [address] [place] [address] Phone: [phone] Fax: [place] [address] - [place], [place] AT [address] [place] [address] Phone: [phone] Fax: [phone]    Your procedure is scheduled on Thursday June 8.  Report to [facility] Admitting at [place].M.  Call this number if you have problems the morning of surgery:  [phone]   Remember:  Do not eat food or drink liquids after midnight.  Take these medicines the morning of surgery with A SIP OF WATER Tylenol if needed, amiodarone (pacerone), diltiazem (Cardizem), Fluticasone (flonase), Lansoprazole (Prevacid), Levothyroxine (Synthroid), Sertraline (Zoloft)  7 days prior to surgery STOP taking any Aspirin, Aleve, Naproxen, Ibuprofen, Motrin, Advil, Goody's, BC's, all herbal medications, fish oil, and all vitamins   WHAT DO I DO ABOUT MY DIABETES MEDICATION?   Marland Kitchen Do not take oral diabetes medicines (pills) the morning of surgery.  DO NOT TAKE METFORMIN    How to Manage Your Diabetes Before and After Surgery  Why is it important to control my blood sugar before and after surgery? . Improving blood sugar levels before and after surgery helps healing and can limit problems. . A way of improving blood sugar control is eating a healthy diet by: o  Eating less sugar and carbohydrates o  Increasing activity/exercise o  Talking with your doctor about reaching your blood sugar goals . High blood sugars (greater than 180 mg/dL) can raise your risk of infections and slow your recovery, so you will need to focus on controlling your diabetes during the weeks before surgery. . Make sure that the doctor who takes care of your diabetes knows about your planned surgery including the date and location.  How do  I manage my blood sugar before surgery? . Check your blood sugar at least 4 times a day, starting 2 days before surgery, to make sure that the level is not too high or low. o Check your blood sugar the morning of your surgery when you wake up and every 2 hours until you get to the Short Stay unit. . If your blood sugar is less than 70 mg/dL, you will need to treat for low blood sugar: o Do not take insulin. o Treat a low blood sugar (less than 70 mg/dL) with  cup of clear juice (cranberry or apple), 4 glucose tablets, OR glucose gel. o Recheck blood sugar in 15 minutes after treatment (to make sure it is greater than 70 mg/dL). If your blood sugar is not greater than 70 mg/dL on recheck, call [phone] for further instructions. . Report your blood sugar to the short stay nurse when you get to Short Stay.  . If you are admitted to the hospital after surgery: o Your blood sugar will be checked by the staff and you will probably be given insulin after surgery (instead of oral diabetes medicines) to make sure you have good blood sugar levels. o The goal for blood sugar control after surgery is 80-180 mg/dL.    Do not wear jewelry, make-up or nail polish.  Do not wear lotions, powders, or perfumes.  You may NOT wear deodorant.  Do not shave 48 hours prior to surgery.    Do not bring valuables to the hospital.  [facility] is not responsible for any belongings or valuables.  Contacts, dentures or bridgework may not be worn into surgery.  Leave your suitcase in the car.  After surgery it may be brought to your room.  For patients admitted to the hospital, discharge time will be determined by your treatment team.  Patients discharged the day of surgery will not be allowed to drive home.    Special instructions:   - Preparing For Surgery  Before surgery, you can play an important role. Because skin is not sterile, your skin needs to be as free of germs as possible. You can reduce  the number of germs on your skin by washing with CHG (chlorahexidine gluconate) Soap before surgery.  CHG is an antiseptic cleaner which kills germs and bonds with the skin to continue killing germs even after washing.  Please do not use if you have an allergy to CHG or antibacterial soaps. If your skin becomes reddened/irritated stop using the CHG.  Do not shave (including legs and underarms) for at least 48 hours prior to first CHG shower. It is OK to shave your face.  Please follow these instructions carefully.   1. Shower the NIGHT BEFORE SURGERY and the MORNING OF SURGERY with CHG.   2. If you chose to wash your hair, wash your hair first as usual with your normal shampoo.  3. After you shampoo, rinse your hair and body thoroughly to remove the shampoo.  4. Use CHG as you would any other liquid soap. You can apply CHG directly to the skin and wash gently with a scrungie or a clean washcloth.   5. Apply the CHG Soap to your body ONLY FROM THE NECK DOWN.  Do not use on open wounds or open sores. Avoid contact with your eyes, ears, mouth and genitals (private parts). Wash genitals (private parts) with your normal soap.  6. Wash thoroughly, paying special attention to the area where your surgery will be performed.  7. Thoroughly rinse your body with warm water from the neck down.  8. DO NOT shower/wash with your normal soap after using and rinsing off the CHG Soap.  9. Pat yourself dry with a CLEAN TOWEL.   10. Wear CLEAN PAJAMAS   11. Place CLEAN SHEETS on your bed the night of your first shower and DO NOT SLEEP WITH PETS.    Day of Surgery: Do not apply any deodorants/lotions. Please wear clean clothes to the hospital/surgery center.      Please read over the following fact sheets that you were given. Pain Booklet, Coughing and Deep Breathing, MRSA Information and Surgical Site Infection Prevention

## 2016-04-12 ENCOUNTER — Encounter (HOSPITAL_COMMUNITY): Payer: Self-pay

## 2016-04-12 ENCOUNTER — Encounter (HOSPITAL_COMMUNITY)
Admission: RE | Admit: 2016-04-12 | Discharge: 2016-04-12 | Disposition: A | Discharge status: Home / Self Care | Payer: Medicare Other | Source: Ambulatory Visit | Attending: Neurosurgery | Admitting: Neurosurgery

## 2016-04-12 DIAGNOSIS — Z0183 Encounter for blood typing: Secondary | ICD-10-CM | POA: Insufficient documentation

## 2016-04-12 DIAGNOSIS — M4806 Spinal stenosis, lumbar region: Secondary | ICD-10-CM | POA: Insufficient documentation

## 2016-04-12 DIAGNOSIS — Z01812 Encounter for preprocedural laboratory examination: Secondary | ICD-10-CM | POA: Insufficient documentation

## 2016-04-12 HISTORY — DX: Cardiac arrhythmia, unspecified: I49.9

## 2016-04-12 HISTORY — DX: Anemia, unspecified: D64.9

## 2016-04-12 LAB — GLUCOSE, CAPILLARY: Glucose-Capillary: 140 mg/dL — ABNORMAL HIGH (ref 65–99)

## 2016-04-12 LAB — CBC
HCT: 35.7 % — ABNORMAL LOW (ref 36.0–46.0)
Hemoglobin: 11.2 g/dL — ABNORMAL LOW (ref 12.0–15.0)
MCH: 27.1 pg (ref 26.0–34.0)
MCHC: 31.4 g/dL (ref 30.0–36.0)
MCV: 86.2 fL (ref 78.0–100.0)
PLATELETS: 192 10*3/uL (ref 150–400)
RBC: 4.14 MIL/uL (ref 3.87–5.11)
RDW: 14.5 % (ref 11.5–15.5)
WBC: 5.1 10*3/uL (ref 4.0–10.5)

## 2016-04-12 LAB — BASIC METABOLIC PANEL
ANION GAP: 9 (ref 5–15)
BUN: 14 mg/dL (ref 6–20)
CALCIUM: 9.6 mg/dL (ref 8.9–10.3)
CO2: 25 mmol/L (ref 22–32)
Chloride: 104 mmol/L (ref 101–111)
Creatinine, Ser: 0.86 mg/dL (ref 0.44–1.00)
GFR calc Af Amer: >60 mL/min (ref >60)
GLUCOSE: 129 mg/dL — AB (ref 65–99)
POTASSIUM: 3.9 mmol/L (ref 3.5–5.1)
SODIUM: 138 mmol/L (ref 135–145)

## 2016-04-12 LAB — ABO/RH: ABO/RH(D): A POS

## 2016-04-12 LAB — SURGICAL PCR SCREEN
MRSA, PCR: NEGATIVE
Staphylococcus aureus: NEGATIVE

## 2016-04-12 LAB — TYPE AND SCREEN
ABO/RH(D): A POS
Antibody Screen: NEGATIVE

## 2016-04-12 NOTE — Progress Notes (Signed)
PCP - [name] - [facility]  Chest x-ray - 03/31/15 EKG - 02/18/16 Stress Test - >10 years ECHO - 04/27/15 Cardiac Cath - denies  Patient was instructed by physician to stop Eliquis on 04/13/16.  Patient has sleep apnea and stated that she will bring her mask on the day of surgery  Fastin blood glucose is 110  Patient denies shortness of breath and chest pain at PAT appointment

## 2016-04-13 LAB — HEMOGLOBIN A1C
Hgb A1c MFr Bld: 7.4 % — ABNORMAL HIGH (ref 4.8–5.6)
MEAN PLASMA GLUCOSE: 166 mg/dL

## 2016-04-19 ENCOUNTER — Encounter (HOSPITAL_COMMUNITY): Payer: Self-pay | Admitting: Anesthesiology

## 2016-04-19 NOTE — Anesthesia Preprocedure Evaluation (Addendum)
Anesthesia Evaluation  Patient identified by MRN, date of birth, ID band Patient awake    Reviewed: Allergy & Precautions, NPO status , Patient's Chart, lab work & pertinent test results  History of Anesthesia Complications (+) PONV and history of anesthetic complications  Airway Mallampati: III  TM Distance: >3 FB Neck ROM: Full    Dental no notable dental hx. (+) Caps   Pulmonary sleep apnea and Continuous Positive Airway Pressure Ventilation ,    Pulmonary exam normal breath sounds clear to auscultation       Cardiovascular hypertension, Pt. on medications Normal cardiovascular exam+ dysrhythmias Atrial Fibrillation + Valvular Problems/Murmurs MR  Rhythm:Regular Rate:Normal  EKG 02/2016 NSR q in II,III, aVF, V4-V6, unchanged from previous tracing S/P DC Cardioversion LVEF [id number] Grade 2 diastolic dysfunction Hx/ PAF w/ RVR S/P DC Cardioversion x 3, controlled with Rx, currently NSR   Neuro/Psych  Headaches, Anxiety Hx/o Anterior ischemic optic neuropathy Hx/o glaucoma    Neuromuscular disease    GI/Hepatic Neg liver ROS, hiatal hernia, GERD  Medicated and Controlled,Hx/o Barrett's esophagus   Endo/Other  diabetes, Type 2, Oral Hypoglycemic AgentsHypothyroidism Obesity Hyperlipidemia Hx/o Breast Ca Hx/o Thyroid Ca  Renal/GU negative Renal ROS  negative genitourinary   Musculoskeletal  (+) Arthritis , Osteoarthritis,  Lumbar stenosis L4-5   Abdominal (+) + obese,   Peds  Hematology  (+) anemia , On Eliquis - last dose 04/13/2016   Anesthesia Other Findings   Reproductive/Obstetrics                       Anesthesia Physical Anesthesia Plan  ASA: III  Anesthesia Plan: General   Post-op Pain Management:    Induction: Intravenous  Airway Management Planned: Oral ETT  Additional Equipment:   Intra-op Plan:   Post-operative Plan: Extubation in OR  Informed Consent: I have reviewed  the patients History and Physical, chart, labs and discussed the procedure including the risks, benefits and alternatives for the proposed anesthesia with the patient or authorized representative who has indicated his/her understanding and acceptance.   Dental advisory given  Plan Discussed with: Anesthesiologist, CRNA and Surgeon  Anesthesia Plan Comments:         Anesthesia Quick Evaluation

## 2016-04-20 ENCOUNTER — Inpatient Hospital Stay (HOSPITAL_COMMUNITY): Payer: Medicare Other | Admitting: Certified Registered Nurse Anesthetist

## 2016-04-20 ENCOUNTER — Inpatient Hospital Stay (HOSPITAL_COMMUNITY)
Admission: RE | Admit: 2016-04-20 | Discharge: 2016-04-21 | DRG: 460 | Disposition: A | Discharge status: Home / Self Care | Payer: Medicare Other | Source: Ambulatory Visit | Attending: Neurosurgery | Admitting: Neurosurgery

## 2016-04-20 ENCOUNTER — Inpatient Hospital Stay (HOSPITAL_COMMUNITY): Payer: Medicare Other

## 2016-04-20 ENCOUNTER — Encounter (HOSPITAL_COMMUNITY): Payer: Self-pay | Admitting: *Deleted

## 2016-04-20 ENCOUNTER — Encounter (HOSPITAL_COMMUNITY)
Admission: RE | Disposition: A | Discharge status: Home / Self Care | Payer: Self-pay | Source: Ambulatory Visit | Attending: Neurosurgery

## 2016-04-20 DIAGNOSIS — H409 Unspecified glaucoma: Secondary | ICD-10-CM | POA: Diagnosis present

## 2016-04-20 DIAGNOSIS — M4316 Spondylolisthesis, lumbar region: Secondary | ICD-10-CM | POA: Diagnosis present

## 2016-04-20 DIAGNOSIS — Z961 Presence of intraocular lens: Secondary | ICD-10-CM | POA: Diagnosis present

## 2016-04-20 DIAGNOSIS — M5136 Other intervertebral disc degeneration, lumbar region: Secondary | ICD-10-CM | POA: Diagnosis present

## 2016-04-20 DIAGNOSIS — M4806 Spinal stenosis, lumbar region: Principal | ICD-10-CM | POA: Diagnosis present

## 2016-04-20 DIAGNOSIS — G4733 Obstructive sleep apnea (adult) (pediatric): Secondary | ICD-10-CM | POA: Diagnosis present

## 2016-04-20 DIAGNOSIS — I10 Essential (primary) hypertension: Secondary | ICD-10-CM | POA: Diagnosis present

## 2016-04-20 DIAGNOSIS — Z7984 Long term (current) use of oral hypoglycemic drugs: Secondary | ICD-10-CM

## 2016-04-20 DIAGNOSIS — E11319 Type 2 diabetes mellitus with unspecified diabetic retinopathy without macular edema: Secondary | ICD-10-CM | POA: Diagnosis present

## 2016-04-20 DIAGNOSIS — Z79899 Other long term (current) drug therapy: Secondary | ICD-10-CM

## 2016-04-20 DIAGNOSIS — K219 Gastro-esophageal reflux disease without esophagitis: Secondary | ICD-10-CM | POA: Diagnosis present

## 2016-04-20 DIAGNOSIS — Z9842 Cataract extraction status, left eye: Secondary | ICD-10-CM

## 2016-04-20 DIAGNOSIS — M47816 Spondylosis without myelopathy or radiculopathy, lumbar region: Secondary | ICD-10-CM | POA: Diagnosis present

## 2016-04-20 DIAGNOSIS — I480 Paroxysmal atrial fibrillation: Secondary | ICD-10-CM | POA: Diagnosis present

## 2016-04-20 DIAGNOSIS — E7800 Pure hypercholesterolemia, unspecified: Secondary | ICD-10-CM | POA: Diagnosis present

## 2016-04-20 DIAGNOSIS — Z880 Allergy status to penicillin: Secondary | ICD-10-CM

## 2016-04-20 DIAGNOSIS — K2270 Barrett's esophagus without dysplasia: Secondary | ICD-10-CM | POA: Diagnosis present

## 2016-04-20 DIAGNOSIS — Z9841 Cataract extraction status, right eye: Secondary | ICD-10-CM | POA: Diagnosis not present

## 2016-04-20 DIAGNOSIS — M48062 Spinal stenosis, lumbar region with neurogenic claudication: Secondary | ICD-10-CM | POA: Diagnosis present

## 2016-04-20 DIAGNOSIS — Z90710 Acquired absence of both cervix and uterus: Secondary | ICD-10-CM

## 2016-04-20 DIAGNOSIS — Z419 Encounter for procedure for purposes other than remedying health state, unspecified: Secondary | ICD-10-CM

## 2016-04-20 HISTORY — DX: Spinal stenosis, lumbar region with neurogenic claudication: M48.062

## 2016-04-20 LAB — GLUCOSE, CAPILLARY
Glucose-Capillary: 112 mg/dL — ABNORMAL HIGH (ref 65–99)
Glucose-Capillary: 145 mg/dL — ABNORMAL HIGH (ref 65–99)
Glucose-Capillary: 167 mg/dL — ABNORMAL HIGH (ref 65–99)
Glucose-Capillary: 251 mg/dL — ABNORMAL HIGH (ref 65–99)
Glucose-Capillary: 160 mg/dL — ABNORMAL HIGH (ref 65–99)

## 2016-04-20 LAB — PROTIME-INR
INR: 1.12 (ref 0.00–1.49)
Prothrombin Time: 14.6 seconds (ref 11.6–15.2)

## 2016-04-20 SURGERY — POSTERIOR LUMBAR FUSION 1 LEVEL
Anesthesia: General | Site: Spine Lumbar

## 2016-04-20 MED ORDER — DEXAMETHASONE SODIUM PHOSPHATE 10 MG/ML IJ SOLN
INTRAMUSCULAR | Status: AC
  Filled 2016-04-20: qty 1

## 2016-04-20 MED ORDER — LOSARTAN POTASSIUM-HCTZ 50-12.5 MG PO TABS: 1.0000 | ORAL_TABLET | Freq: Two times a day (BID) | ORAL | Status: DC

## 2016-04-20 MED ORDER — PANTOPRAZOLE SODIUM 40 MG PO TBEC
40.0000 mg | DELAYED_RELEASE_TABLET | Freq: Every day | ORAL | Status: DC
  Administered 2016-04-21: 40 mg via ORAL
  Filled 2016-04-20: qty 1

## 2016-04-20 MED ORDER — METOCLOPRAMIDE HCL 5 MG/ML IJ SOLN
INTRAMUSCULAR | Status: AC
  Filled 2016-04-20: qty 2

## 2016-04-20 MED ORDER — SUCCINYLCHOLINE CHLORIDE 20 MG/ML IJ SOLN
INTRAMUSCULAR | Status: DC | PRN
  Administered 2016-04-20: 100 mg via INTRAVENOUS

## 2016-04-20 MED ORDER — HYDROCHLOROTHIAZIDE 12.5 MG PO CAPS
12.5000 mg | ORAL_CAPSULE | Freq: Two times a day (BID) | ORAL | Status: DC
  Administered 2016-04-21 (×2): 12.5 mg via ORAL
  Filled 2016-04-20 (×2): qty 1

## 2016-04-20 MED ORDER — POTASSIUM CHLORIDE CRYS ER 20 MEQ PO TBCR
60.0000 meq | EXTENDED_RELEASE_TABLET | Freq: Every day | ORAL | Status: DC
  Administered 2016-04-21: 60 meq via ORAL
  Filled 2016-04-20 (×2): qty 3

## 2016-04-20 MED ORDER — LACTATED RINGERS IV SOLN
INTRAVENOUS | Status: DC | PRN
  Administered 2016-04-20 (×2): via INTRAVENOUS

## 2016-04-20 MED ORDER — MAGNESIUM HYDROXIDE 400 MG/5ML PO SUSP: 30.0000 mL | Freq: Every day | ORAL | Status: DC | PRN

## 2016-04-20 MED ORDER — ALBUMIN HUMAN 5 % IV SOLN
INTRAVENOUS | Status: DC | PRN
Start: 2016-04-20 — End: 2016-04-20
  Administered 2016-04-20 (×2): via INTRAVENOUS

## 2016-04-20 MED ORDER — HYDROCODONE-ACETAMINOPHEN 5-325 MG PO TABS
1.0000 | ORAL_TABLET | ORAL | Status: DC | PRN
  Administered 2016-04-20: 2 via ORAL

## 2016-04-20 MED ORDER — OXYCODONE-ACETAMINOPHEN 5-325 MG PO TABS: 1.0000 | ORAL_TABLET | ORAL | Status: DC | PRN

## 2016-04-20 MED ORDER — EPHEDRINE SULFATE 50 MG/ML IJ SOLN
INTRAMUSCULAR | Status: DC | PRN
  Administered 2016-04-20 (×2): 5 mg via INTRAVENOUS

## 2016-04-20 MED ORDER — ONDANSETRON HCL 4 MG/2ML IJ SOLN
4.0000 mg | Freq: Four times a day (QID) | INTRAMUSCULAR | Status: DC | PRN
Start: 2016-04-20 — End: 2016-04-21

## 2016-04-20 MED ORDER — LOSARTAN POTASSIUM 50 MG PO TABS
50.0000 mg | ORAL_TABLET | Freq: Two times a day (BID) | ORAL | Status: DC
  Administered 2016-04-21 (×2): 50 mg via ORAL
  Filled 2016-04-20 (×2): qty 1

## 2016-04-20 MED ORDER — GENTAMICIN IN SALINE 1.6-0.9 MG/ML-% IV SOLN
80.0000 mg | INTRAVENOUS | Status: DC
  Filled 2016-04-20: qty 50

## 2016-04-20 MED ORDER — HYDROXYZINE HCL 50 MG/ML IM SOLN: 50.0000 mg | INTRAMUSCULAR | Status: DC | PRN

## 2016-04-20 MED ORDER — MENTHOL 3 MG MT LOZG: 1.0000 | LOZENGE | OROMUCOSAL | Status: DC | PRN

## 2016-04-20 MED ORDER — SODIUM CHLORIDE 0.9% FLUSH
3.0000 mL | Freq: Two times a day (BID) | INTRAVENOUS | Status: DC
  Administered 2016-04-20 – 2016-04-21 (×3): 3 mL via INTRAVENOUS

## 2016-04-20 MED ORDER — ZOLPIDEM TARTRATE 5 MG PO TABS: 5.0000 mg | ORAL_TABLET | Freq: Every evening | ORAL | Status: DC | PRN

## 2016-04-20 MED ORDER — POTASSIUM CHLORIDE IN NACL 20-0.9 MEQ/L-% IV SOLN
INTRAVENOUS | Status: DC
  Filled 2016-04-20 (×5): qty 1000

## 2016-04-20 MED ORDER — ACETAMINOPHEN 10 MG/ML IV SOLN
INTRAVENOUS | Status: AC
  Administered 2016-04-20: 1000 mg via INTRAVENOUS
  Filled 2016-04-20: qty 100

## 2016-04-20 MED ORDER — FENTANYL CITRATE (PF) 250 MCG/5ML IJ SOLN
INTRAMUSCULAR | Status: AC
  Filled 2016-04-20: qty 5

## 2016-04-20 MED ORDER — CYCLOBENZAPRINE HCL 10 MG PO TABS: 10.0000 mg | ORAL_TABLET | Freq: Three times a day (TID) | ORAL | Status: DC | PRN

## 2016-04-20 MED ORDER — HYDROCHLOROTHIAZIDE 12.5 MG PO CAPS
12.5000 mg | ORAL_CAPSULE | Freq: Every day | ORAL | Status: DC
  Filled 2016-04-20: qty 1

## 2016-04-20 MED ORDER — ALUM & MAG HYDROXIDE-SIMETH 200-200-20 MG/5ML PO SUSP: 30.0000 mL | Freq: Four times a day (QID) | ORAL | Status: DC | PRN

## 2016-04-20 MED ORDER — LEVOTHYROXINE SODIUM 150 MCG PO TABS
150.0000 ug | ORAL_TABLET | Freq: Every day | ORAL | Status: DC
  Administered 2016-04-21: 150 ug via ORAL
  Filled 2016-04-20 (×2): qty 1

## 2016-04-20 MED ORDER — ONDANSETRON HCL 4 MG PO TABS: 4.0000 mg | ORAL_TABLET | Freq: Four times a day (QID) | ORAL | Status: DC | PRN

## 2016-04-20 MED ORDER — THROMBIN 5000 UNITS EX SOLR
CUTANEOUS | Status: DC | PRN
  Administered 2016-04-20: 11:00:00 via TOPICAL

## 2016-04-20 MED ORDER — ROCURONIUM BROMIDE 50 MG/5ML IV SOLN
INTRAVENOUS | Status: AC
  Filled 2016-04-20: qty 2

## 2016-04-20 MED ORDER — KETOROLAC TROMETHAMINE 15 MG/ML IJ SOLN
INTRAMUSCULAR | Status: AC
  Administered 2016-04-20: 15 mg
  Filled 2016-04-20: qty 1

## 2016-04-20 MED ORDER — EPHEDRINE 5 MG/ML INJ
INTRAVENOUS | Status: AC
  Filled 2016-04-20: qty 10

## 2016-04-20 MED ORDER — HYDROCODONE-ACETAMINOPHEN 5-325 MG PO TABS
ORAL_TABLET | ORAL | Status: AC
  Filled 2016-04-20: qty 2

## 2016-04-20 MED ORDER — AMIODARONE HCL 200 MG PO TABS
200.0000 mg | ORAL_TABLET | Freq: Every day | ORAL | Status: DC
  Administered 2016-04-21: 200 mg via ORAL
  Filled 2016-04-20: qty 1

## 2016-04-20 MED ORDER — BUPIVACAINE HCL (PF) 0.5 % IJ SOLN
INTRAMUSCULAR | Status: DC | PRN
  Administered 2016-04-20: 15 mL

## 2016-04-20 MED ORDER — ONDANSETRON HCL 4 MG/2ML IJ SOLN
INTRAMUSCULAR | Status: AC
  Filled 2016-04-20: qty 2

## 2016-04-20 MED ORDER — LIDOCAINE HCL (CARDIAC) 20 MG/ML IV SOLN
INTRAVENOUS | Status: DC | PRN
  Administered 2016-04-20: 100 mg via INTRAVENOUS

## 2016-04-20 MED ORDER — MEPERIDINE HCL 25 MG/ML IJ SOLN: 6.2500 mg | INTRAMUSCULAR | Status: DC | PRN

## 2016-04-20 MED ORDER — DEXAMETHASONE SODIUM PHOSPHATE 10 MG/ML IJ SOLN
INTRAMUSCULAR | Status: DC | PRN
  Administered 2016-04-20: 10 mg via INTRAVENOUS

## 2016-04-20 MED ORDER — GLYCOPYRROLATE 0.2 MG/ML IV SOSY
PREFILLED_SYRINGE | INTRAVENOUS | Status: AC
  Filled 2016-04-20: qty 3

## 2016-04-20 MED ORDER — FENTANYL CITRATE (PF) 100 MCG/2ML IJ SOLN
INTRAMUSCULAR | Status: DC | PRN
  Administered 2016-04-20 (×5): 50 ug via INTRAVENOUS

## 2016-04-20 MED ORDER — ACETAMINOPHEN 325 MG PO TABS: 650.0000 mg | ORAL_TABLET | ORAL | Status: DC | PRN

## 2016-04-20 MED ORDER — BACITRACIN 50000 UNITS IM SOLR
INTRAMUSCULAR | Status: DC | PRN
  Administered 2016-04-20: 09:00:00

## 2016-04-20 MED ORDER — VANCOMYCIN HCL IN DEXTROSE 1-5 GM/200ML-% IV SOLN
INTRAVENOUS | Status: AC
  Administered 2016-04-20: 1000 mg via INTRAVENOUS
  Filled 2016-04-20: qty 200

## 2016-04-20 MED ORDER — PROPOFOL 10 MG/ML IV BOLUS
INTRAVENOUS | Status: AC
  Filled 2016-04-20: qty 20

## 2016-04-20 MED ORDER — 0.9 % SODIUM CHLORIDE (POUR BTL) OPTIME
TOPICAL | Status: DC | PRN
  Administered 2016-04-20: 1000 mL

## 2016-04-20 MED ORDER — HYDROMORPHONE HCL 1 MG/ML IJ SOLN
INTRAMUSCULAR | Status: AC
  Filled 2016-04-20: qty 1

## 2016-04-20 MED ORDER — ROSUVASTATIN CALCIUM 10 MG PO TABS
10.0000 mg | ORAL_TABLET | Freq: Every day | ORAL | Status: DC
  Administered 2016-04-20: 10 mg via ORAL
  Filled 2016-04-20 (×2): qty 1

## 2016-04-20 MED ORDER — SODIUM CHLORIDE 0.9% FLUSH: 3.0000 mL | INTRAVENOUS | Status: DC | PRN

## 2016-04-20 MED ORDER — SUGAMMADEX SODIUM 200 MG/2ML IV SOLN
INTRAVENOUS | Status: DC | PRN
  Administered 2016-04-20: 200 mg via INTRAVENOUS

## 2016-04-20 MED ORDER — LORAZEPAM 0.5 MG PO TABS
1.5000 mg | ORAL_TABLET | Freq: Every day | ORAL | Status: DC
  Administered 2016-04-20: 1.5 mg via ORAL
  Filled 2016-04-20: qty 3

## 2016-04-20 MED ORDER — ROCURONIUM BROMIDE 100 MG/10ML IV SOLN
INTRAVENOUS | Status: DC | PRN
  Administered 2016-04-20 (×3): 10 mg via INTRAVENOUS
  Administered 2016-04-20: 40 mg via INTRAVENOUS
  Administered 2016-04-20: 10 mg via INTRAVENOUS

## 2016-04-20 MED ORDER — LOSARTAN POTASSIUM 50 MG PO TABS
50.0000 mg | ORAL_TABLET | Freq: Every day | ORAL | Status: DC
  Filled 2016-04-20: qty 1

## 2016-04-20 MED ORDER — KETOROLAC TROMETHAMINE 30 MG/ML IJ SOLN
15.0000 mg | Freq: Four times a day (QID) | INTRAMUSCULAR | Status: DC
  Administered 2016-04-20 – 2016-04-21 (×5): 15 mg via INTRAVENOUS
  Filled 2016-04-20 (×5): qty 1

## 2016-04-20 MED ORDER — VANCOMYCIN HCL IN DEXTROSE 1-5 GM/200ML-% IV SOLN: 1000.0000 mg | INTRAVENOUS | Status: DC

## 2016-04-20 MED ORDER — ARTIFICIAL TEARS OP OINT
TOPICAL_OINTMENT | OPHTHALMIC | Status: DC | PRN
  Administered 2016-04-20: 1 via OPHTHALMIC

## 2016-04-20 MED ORDER — METOCLOPRAMIDE HCL 5 MG/ML IJ SOLN
INTRAMUSCULAR | Status: DC | PRN
  Administered 2016-04-20: 10 mg via INTRAVENOUS

## 2016-04-20 MED ORDER — ARTIFICIAL TEARS OP OINT
TOPICAL_OINTMENT | OPHTHALMIC | Status: AC
  Filled 2016-04-20: qty 3.5

## 2016-04-20 MED ORDER — METOCLOPRAMIDE HCL 5 MG/ML IJ SOLN: 10.0000 mg | Freq: Once | INTRAMUSCULAR | Status: DC | PRN

## 2016-04-20 MED ORDER — METFORMIN HCL ER 500 MG PO TB24
500.0000 mg | ORAL_TABLET | Freq: Two times a day (BID) | ORAL | Status: DC
  Administered 2016-04-21 (×2): 500 mg via ORAL
  Filled 2016-04-20 (×2): qty 1

## 2016-04-20 MED ORDER — DILTIAZEM HCL ER COATED BEADS 360 MG PO CP24
360.0000 mg | ORAL_CAPSULE | Freq: Every day | ORAL | Status: DC
  Administered 2016-04-21: 360 mg via ORAL
  Filled 2016-04-20: qty 1

## 2016-04-20 MED ORDER — PROPOFOL 10 MG/ML IV BOLUS
INTRAVENOUS | Status: DC | PRN
  Administered 2016-04-20: 30 mg via INTRAVENOUS
  Administered 2016-04-20: 20 mg via INTRAVENOUS
  Administered 2016-04-20: 30 mg via INTRAVENOUS
  Administered 2016-04-20: 20 mg via INTRAVENOUS
  Administered 2016-04-20: 100 mg via INTRAVENOUS

## 2016-04-20 MED ORDER — BISACODYL 10 MG RE SUPP: 10.0000 mg | Freq: Every day | RECTAL | Status: DC | PRN

## 2016-04-20 MED ORDER — SUCCINYLCHOLINE CHLORIDE 200 MG/10ML IV SOSY
PREFILLED_SYRINGE | INTRAVENOUS | Status: AC
  Filled 2016-04-20: qty 10

## 2016-04-20 MED ORDER — GENTAMICIN SULFATE 40 MG/ML IJ SOLN
135.4500 mg | INTRAMUSCULAR | Status: DC | PRN
  Administered 2016-04-20: 80 mg via INTRAVENOUS

## 2016-04-20 MED ORDER — HYDROXYZINE HCL 25 MG PO TABS: 50.0000 mg | ORAL_TABLET | ORAL | Status: DC | PRN

## 2016-04-20 MED ORDER — GLYCOPYRROLATE 0.2 MG/ML IJ SOLN
INTRAMUSCULAR | Status: DC | PRN
  Administered 2016-04-20: 0.2 mg via INTRAVENOUS

## 2016-04-20 MED ORDER — ACETAMINOPHEN 650 MG RE SUPP: 650.0000 mg | RECTAL | Status: DC | PRN

## 2016-04-20 MED ORDER — ONDANSETRON HCL 4 MG/2ML IJ SOLN
INTRAMUSCULAR | Status: DC | PRN
  Administered 2016-04-20: 4 mg via INTRAVENOUS

## 2016-04-20 MED ORDER — SERTRALINE HCL 50 MG PO TABS
50.0000 mg | ORAL_TABLET | Freq: Every day | ORAL | Status: DC
  Administered 2016-04-21: 50 mg via ORAL
  Filled 2016-04-20: qty 1

## 2016-04-20 MED ORDER — FLUTICASONE PROPIONATE 50 MCG/ACT NA SUSP
1.0000 | Freq: Every day | NASAL | Status: DC
  Filled 2016-04-20: qty 16

## 2016-04-20 MED ORDER — PHENOL 1.4 % MT LIQD: 1.0000 | OROMUCOSAL | Status: DC | PRN

## 2016-04-20 MED ORDER — LIDOCAINE-EPINEPHRINE 1 %-1:100000 IJ SOLN
INTRAMUSCULAR | Status: DC | PRN
  Administered 2016-04-20: 15 mL

## 2016-04-20 MED ORDER — KETOROLAC TROMETHAMINE 30 MG/ML IJ SOLN: 15.0000 mg | Freq: Once | INTRAMUSCULAR | Status: DC

## 2016-04-20 MED ORDER — PHENYLEPHRINE HCL 10 MG/ML IJ SOLN
10.0000 mg | INTRAVENOUS | Status: DC | PRN
  Administered 2016-04-20: 10 ug/min via INTRAVENOUS

## 2016-04-20 MED ORDER — THROMBIN 20000 UNITS EX SOLR
CUTANEOUS | Status: DC | PRN
  Administered 2016-04-20: 09:00:00 via TOPICAL

## 2016-04-20 MED ORDER — MORPHINE SULFATE (PF) 4 MG/ML IV SOLN: 4.0000 mg | INTRAVENOUS | Status: DC | PRN

## 2016-04-20 MED ORDER — HYDROMORPHONE HCL 1 MG/ML IJ SOLN: 0.2500 mg | INTRAMUSCULAR | Status: DC | PRN

## 2016-04-20 MED FILL — Sodium Chloride IV Soln 0.9%: INTRAVENOUS | Qty: 2000 | Status: AC

## 2016-04-20 SURGICAL SUPPLY — 68 items
BAG DECANTER FOR FLEXI CONT (MISCELLANEOUS) ×2 IMPLANT
BRUSH SCRUB EZ PLAIN DRY (MISCELLANEOUS) ×2 IMPLANT
BUR ACRON 5.0MM COATED (BURR) ×4 IMPLANT
BUR MATCHSTICK NEURO 3.0 LAGG (BURR) ×2 IMPLANT
CANISTER SUCT 3000ML PPV (MISCELLANEOUS) ×2 IMPLANT
CAP LCK SPNE (Orthopedic Implant) ×4 IMPLANT
CAP LOCK SPINE RADIUS (Orthopedic Implant) ×4 IMPLANT
CAP LOCKING (Orthopedic Implant) ×4 IMPLANT
CONT SPEC 4OZ CLIKSEAL STRL BL (MISCELLANEOUS) ×2 IMPLANT
COVER BACK TABLE 60X90IN (DRAPES) ×2 IMPLANT
DERMABOND ADVANCED (GAUZE/BANDAGES/DRESSINGS) ×2
DERMABOND ADVANCED .7 DNX12 (GAUZE/BANDAGES/DRESSINGS) ×2 IMPLANT
DRAPE C-ARM 42X72 X-RAY (DRAPES) ×4 IMPLANT
DRAPE LAPAROTOMY 100X72X124 (DRAPES) ×2 IMPLANT
DRAPE POUCH INSTRU U-SHP 10X18 (DRAPES) ×2 IMPLANT
DRAPE PROXIMA HALF (DRAPES) ×4 IMPLANT
DRSG EMULSION OIL 3X3 NADH (GAUZE/BANDAGES/DRESSINGS) IMPLANT
ELECT REM PT RETURN 9FT ADLT (ELECTROSURGICAL) ×2
ELECTRODE REM PT RTRN 9FT ADLT (ELECTROSURGICAL) ×1 IMPLANT
GAUZE SPONGE 4X4 12PLY STRL (GAUZE/BANDAGES/DRESSINGS) ×2 IMPLANT
GAUZE SPONGE 4X4 16PLY XRAY LF (GAUZE/BANDAGES/DRESSINGS) IMPLANT
GLOVE BIO SURGEON STRL SZ 6.5 (GLOVE) ×6 IMPLANT
GLOVE BIO SURGEON STRL SZ7.5 (GLOVE) ×2 IMPLANT
GLOVE BIOGEL PI IND STRL 7.0 (GLOVE) ×2 IMPLANT
GLOVE BIOGEL PI IND STRL 7.5 (GLOVE) ×2 IMPLANT
GLOVE BIOGEL PI IND STRL 8 (GLOVE) ×4 IMPLANT
GLOVE BIOGEL PI INDICATOR 7.0 (GLOVE) ×2
GLOVE BIOGEL PI INDICATOR 7.5 (GLOVE) ×2
GLOVE BIOGEL PI INDICATOR 8 (GLOVE) ×4
GLOVE ECLIPSE 7.5 STRL STRAW (GLOVE) ×6 IMPLANT
GOWN STRL REUS W/ TWL LRG LVL3 (GOWN DISPOSABLE) ×2 IMPLANT
GOWN STRL REUS W/ TWL XL LVL3 (GOWN DISPOSABLE) ×3 IMPLANT
GOWN STRL REUS W/TWL 2XL LVL3 (GOWN DISPOSABLE) IMPLANT
GOWN STRL REUS W/TWL LRG LVL3 (GOWN DISPOSABLE) ×2
GOWN STRL REUS W/TWL XL LVL3 (GOWN DISPOSABLE) ×3
HEMOSTAT POWDER SURGIFOAM 1G (HEMOSTASIS) ×2 IMPLANT
KIT BASIN OR (CUSTOM PROCEDURE TRAY) ×2 IMPLANT
KIT INFUSE SMALL (Orthopedic Implant) ×2 IMPLANT
KIT ROOM TURNOVER OR (KITS) ×2 IMPLANT
MILL MEDIUM DISP (BLADE) ×2 IMPLANT
NEEDLE ASP BONE MRW 8GX15 (NEEDLE) ×2 IMPLANT
NEEDLE SPNL 18GX3.5 QUINCKE PK (NEEDLE) IMPLANT
NEEDLE SPNL 22GX3.5 QUINCKE BK (NEEDLE) ×4 IMPLANT
NS IRRIG 1000ML POUR BTL (IV SOLUTION) ×2 IMPLANT
PACK LAMINECTOMY NEURO (CUSTOM PROCEDURE TRAY) ×2 IMPLANT
PAD ARMBOARD 7.5X6 YLW CONV (MISCELLANEOUS) ×6 IMPLANT
PATTIES SURGICAL .5 X.5 (GAUZE/BANDAGES/DRESSINGS) IMPLANT
PATTIES SURGICAL .5 X1 (DISPOSABLE) IMPLANT
PATTIES SURGICAL 1X1 (DISPOSABLE) ×2 IMPLANT
PEEK PLIF AVS 12X20X4 (Peek) ×4 IMPLANT
ROD 5.5X30MM (Rod) ×4 IMPLANT
SCREW 5.75X40M (Screw) ×4 IMPLANT
SCREW 5.75X45MM (Screw) ×4 IMPLANT
SPONGE LAP 4X18 X RAY DECT (DISPOSABLE) IMPLANT
SPONGE NEURO XRAY DETECT 1X3 (DISPOSABLE) ×2 IMPLANT
SPONGE SURGIFOAM ABS GEL 100 (HEMOSTASIS) ×2 IMPLANT
STRIP BIOACTIVE VITOSS 25X100X (Neuro Prosthesis/Implant) ×2 IMPLANT
STRIP BIOACTIVE VITOSS 25X52X4 (Orthopedic Implant) ×2 IMPLANT
SUT VIC AB 1 CT1 18XBRD ANBCTR (SUTURE) ×2 IMPLANT
SUT VIC AB 1 CT1 8-18 (SUTURE) ×2
SUT VIC AB 2-0 CP2 18 (SUTURE) ×4 IMPLANT
SYR 3ML LL SCALE MARK (SYRINGE) ×8 IMPLANT
SYR CONTROL 10ML LL (SYRINGE) ×2 IMPLANT
TAPE CLOTH SURG 4X10 WHT LF (GAUZE/BANDAGES/DRESSINGS) ×2 IMPLANT
TOWEL OR 17X24 6PK STRL BLUE (TOWEL DISPOSABLE) ×2 IMPLANT
TOWEL OR 17X26 10 PK STRL BLUE (TOWEL DISPOSABLE) ×2 IMPLANT
TRAY FOLEY W/METER SILVER 16FR (SET/KITS/TRAYS/PACK) ×2 IMPLANT
WATER STERILE IRR 1000ML POUR (IV SOLUTION) ×2 IMPLANT

## 2016-04-20 NOTE — Anesthesia Procedure Notes (Signed)
Procedure Name: Intubation Date/Time: 04/20/2016 7:38 AM Performed by: Scheryl Darter Pre-anesthesia Checklist: Patient identified, Emergency Drugs available, Suction available, Patient being monitored and Timeout performed Patient Re-evaluated:Patient Re-evaluated prior to inductionOxygen Delivery Method: Circle system utilized Preoxygenation: Pre-oxygenation with 100% oxygen Intubation Type: IV induction Ventilation: Mask ventilation without difficulty Laryngoscope Size: Miller and 2 Grade View: Grade I Tube type: Oral Tube size: 7.5 mm Number of attempts: 1 Airway Equipment and Method: Stylet Placement Confirmation: ETT inserted through vocal cords under direct vision,  positive ETCO2 and breath sounds checked- equal and bilateral Secured at: 22 cm Tube secured with: Tape Dental Injury: Teeth and Oropharynx as per pre-operative assessment

## 2016-04-20 NOTE — Anesthesia Postprocedure Evaluation (Signed)
Anesthesia Post Note  Patient: [name]  Procedure(s) Performed: Procedure(s) (LRB): L4-L5 lumbar decompression, PLIF, PLA LUMBAR FOUR-FIVE POSTERIOR LUMBAR FUSION INTERBODY FUSION WITH POSTEROLATERAL ARTHRODESIS AND LUMBAR DECOMPRESSION (N/A)  Patient location during evaluation: PACU Anesthesia Type: General Level of consciousness: awake and alert and oriented Pain management: pain level controlled Vital Signs Assessment: post-procedure vital signs reviewed and stable Respiratory status: spontaneous breathing, nonlabored ventilation, respiratory function stable and patient connected to nasal cannula oxygen Cardiovascular status: blood pressure returned to baseline and stable Postop Assessment: no signs of nausea or vomiting Anesthetic complications: no    Last Vitals:  Filed Vitals:   04/20/16 1155 04/20/16 1214  BP: 125/60   Pulse: 71   Temp:  36.3 C  Resp: 21 16    Last Pain:  Filed Vitals:   04/20/16 1216  PainSc: Asleep                 Kurt Hoffmeier A.

## 2016-04-20 NOTE — Transfer of Care (Signed)
Immediate Anesthesia Transfer of Care Note  Patient: SHAWONDA SAUNDER  Procedure(s) Performed: Procedure(s): L4-L5 lumbar decompression, PLIF, PLA LUMBAR FOUR-FIVE POSTERIOR LUMBAR FUSION INTERBODY FUSION WITH POSTEROLATERAL ARTHRODESIS AND LUMBAR DECOMPRESSION (N/A)  Patient Location: PACU  Anesthesia Type:General  Level of Consciousness: awake, alert , oriented and sedated  Airway & Oxygen Therapy: Patient Spontanous Breathing and Patient connected to nasal cannula oxygen  Post-op Assessment: Report given to RN, Post -op Vital signs reviewed and stable and Patient moving all extremities  Post vital signs: Reviewed and stable  Last Vitals:  Filed Vitals:   04/20/16 0618  BP: 137/80  Pulse: 64  Temp: 36.5 C  Resp: 20    Last Pain: There were no vitals filed for this visit.       Complications: No apparent anesthesia complications

## 2016-04-20 NOTE — Progress Notes (Signed)
RT set up CPAP and placed on patient. Tubing and Nasal pillows belong to the patient. Patient is resting comfortably with O2 sat 95%.

## 2016-04-20 NOTE — H&P (Signed)
Subjective: Patient is a 82 y.o. left handed white female who is admitted for treatment of marked multifactorial lumbar stenosis at the L4-5 level, contributed to by a grade 2-3 dynamic degenerative spondylolisthesis at L4-5.  Overall the stenosis is worse on the right side as compared to the left side, which corresponds to her symptoms with low back pain and bilateral lower extremity pain, both worse on the right than the left side. Patient is admitted now for a L4-5 lumbar decompression including laminectomy, facetectomy, and foraminotomy, and the bilateral L4-5 lumbar stabilization via posterior lumbar interbody arthrodesis and posterior lateral arthrodesis, with interbody implants, posterior instrumentation, and bone graft.   Patient Active Problem List   Diagnosis Date Noted  . 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 Malignant neoplasm of breast ([place]) 02/25/2016  . 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 Atrial fibrillation with RVR ([place]) 03/31/2015  . Post-surgical hypothyroidism 04/09/2014  . Anticoagulated by anticoagulation treatment - Eliquis 03/30/2014  . Diabetes mellitus without complication ([place])   . Hypertension   . PAF- s/p DCCV  03/28/2014  . Thyroid cancer ([place])   . Heart murmur   . DJD (degenerative joint disease) of knee   . Glaucoma   . History of laser assisted in situ keratomileusis 07/25/2012  . Degenerative disorder of eye 07/25/2012  . Exotropia, intermittent 07/09/2012  . 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 Error, refractive, myopia 07/09/2012  . ANEMIA, IRON DEFICIENCY 10/14/2010  . GERD 10/14/2010  . ADENOCARCINOMA, BREAST 10/13/2010  . THYROID CANCER 10/13/2010  . DM 10/13/2010  . HYPERCHOLESTEROLEMIA 10/13/2010  . ANEMIA, UNSPECIFIED 10/13/2010  . ANXIETY 10/13/2010  . MIGRAINE HEADACHE 10/13/2010  . HYPERTENSION 10/13/2010  . EROSIVE ESOPHAGITIS 10/13/2010  . BARRETTS ESOPHAGUS 10/13/2010  . HIATAL HERNIA WITH REFLUX 10/13/2010  .  DIVERTICULOSIS, COLON 10/13/2010  . IRRITABLE BOWEL SYNDROME 10/13/2010  . RECTAL FISSURE 10/13/2010  . OSTEOARTHRITIS 10/13/2010  . CARDIAC MURMUR 10/13/2010  . COLONIC POLYPS, ADENOMATOUS, HX OF 10/13/2010   Past Medical History  Diagnosis Date  . Hypothyroidism, postsurgical   . Hypertension   . Glaucoma   . Cervical cancer ([place])   . Breast cancer [facility])     s/p right breast lumpectomy  . Thyroid cancer [facility])     s/p thyroidectomy  . Atrial fibrillation ([place])   . 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 Type II diabetes mellitus ([place])   . OSA on CPAP   . H/O hiatal hernia   . GERD (gastroesophageal reflux disease)   . Migraine     "last one was years ago" (03/31/2014)  . DJD (degenerative joint disease) of knee   . Arthritis     "all my joints" (03/31/2014)  . Allergy     SEASONAL  . Sleep apnea   . Heart murmur   . Dysrhythmia     Afib  . Anemia     years ago  . PONV (postoperative nausea and vomiting)     Past Surgical History  Procedure Laterality Date  . Thyroidectomy    . Tee without cardioversion N/A 03/30/2014    Procedure: TRANSESOPHAGEAL ECHOCARDIOGRAM (TEE);  Surgeon: [name], MD;  Location: [facility] ENDOSCOPY;  Service: Cardiovascular;  Laterality: N/A;  . Cardioversion N/A 03/30/2014    Procedure: CARDIOVERSION - BEDSIDE;  Surgeon: [name], MD;  Location: [place];  Service: Cardiovascular;  Laterality: N/A;  . Tonsillectomy    . Excisional hemorrhoidectomy    . Carpal tunnel release Right   . Abdominal hysterectomy      "partial"  . Cataract extraction w/ intraocular lens  implant, bilateral Bilateral   . Breast biopsy Right   . Breast lumpectomy Right   . Cardioversion N/A 03/31/2014    Procedure: CARDIOVERSION  (BEDSIDE) ;  Surgeon: Lelon Perla, MD;  Location: [place];  Service: Cardiovascular;   Laterality: N/A;  . Cardioversion N/A 04/27/2014    Procedure: CARDIOVERSION;  Surgeon: Darlin Coco, MD;  Location: [place];  Service: Cardiovascular;  Laterality: N/A;  . Cardioversion N/A 04/13/2015    Procedure: CARDIOVERSION;  Surgeon: Thayer Headings, MD;  Location: [facility] ENDOSCOPY;  Service: Cardiovascular;  Laterality: N/A;  . Colonoscopy    . Polypectomy      Prescriptions prior to admission  Medication Sig Dispense Refill Last Dose  . acetaminophen (TYLENOL) 650 MG CR tablet Take 1,300 mg by mouth every 8 (eight) hours.   04/20/2016 at 0430  . amiodarone (PACERONE) 200 MG tablet Take 1 tablet (200 mg total) by mouth daily. 90 tablet 3 04/20/2016 at 0430  . bimatoprost (LUMIGAN) 0.01 % SOLN Place 1 drop into both eyes at bedtime. Eye drop   04/19/2016 at Unknown time  . calcium citrate-vitamin D (CITRACAL+D) 315-200 MG-UNIT tablet Take 1 tablet by mouth 2 (two) times daily.    Past Week at Unknown time  . Cyanocobalamin (B-12 PO) Take 1 tablet by mouth daily as needed (for energy).    Past Week at Unknown time  . diltiazem (CARDIZEM CD) 360 MG 24 hr capsule Take 1 capsule (360 mg total) by mouth daily. 90 capsule 1 04/20/2016 at 0430  . ELIQUIS 5 MG TABS tablet Take 1 tablet by mouth two  times daily 180 tablet 3 04-13-16  . FeFum-FePo-FA-B Cmp-C-Zn-Mn-Cu (TANDEM PLUS) 162-115.2-1 MG CAPS Take 1 tablet by mouth daily as needed (low iron).    Past Month at Unknown time  . fluticasone (FLONASE) 50 MCG/ACT nasal spray Place 1 spray into both nostrils daily. Reported on 02/25/2016   04/20/2016 at Unknown time  . lansoprazole (PREVACID) 30 MG capsule Take 30 mg by mouth every morning.   04/20/2016 at 0430  . levothyroxine (SYNTHROID, LEVOTHROID) 150 MCG tablet Take 150 mcg by mouth daily before breakfast.   04/20/2016 at 0430  . LORazepam (ATIVAN) 1 MG tablet Take 1.5 mg by mouth at bedtime.   04/19/2016 at Unknown time  . losartan-hydrochlorothiazide (HYZAAR) 50-12.5 MG tablet Take 1 tablet by mouth 2  (two) times daily. 180 tablet 2 04/19/2016 at Unknown time  . Lutein 6 MG CAPS Take 6 mg by mouth 2 (two) times daily as needed (for vegetable support).   Past Week at Unknown time  . magnesium oxide (MAG-OX) 400 MG tablet Take 400 mg by mouth 2 (two) times daily.   Past Week at Unknown time  . metFORMIN (GLUMETZA) 500 MG (MOD) 24 hr tablet Take 500 mg by mouth 2 (two) times daily with a meal.    04/19/2016 at Unknown time  . multivitamin-lutein (OCUVITE-LUTEIN) CAPS capsule Take 1 capsule by mouth daily.   Past Week at Unknown time  . ONE TOUCH ULTRA TEST test strip    Taking  . ONETOUCH DELICA LANCETS [id number] MISC    Taking  . potassium chloride SA (K-DUR,KLOR-CON) 20 MEQ tablet Take 3 tablets (60 mEq total) by mouth daily. 270 tablet  3 Past Week at Unknown time  . rosuvastatin (CRESTOR) 20 MG tablet Take 10 mg by mouth at bedtime.   04/19/2016 at Unknown time  . sertraline (ZOLOFT) 50 MG tablet Take 50 mg by mouth daily.   04/20/2016 at 0430   Allergies  Allergen Reactions  . Penicillins Itching and Rash    Has patient had a PCN reaction causing immediate rash, facial/tongue/throat swelling, SOB or lightheadedness with hypotension: Unknown, childhood reaction Has patient had a PCN reaction causing severe rash involving mucus membranes or skin necrosis: No Has patient had a PCN reaction that required hospitalization No Has patient had a PCN reaction occurring within the last 10 years: No If all of the above answers are "NO", then may proceed with Cephalosporin use.     Social History  Substance Use Topics  . Smoking status: Never Smoker   . Smokeless tobacco: Never Used  . Alcohol Use: No    Family History  Problem Relation Age of Onset  . Prostate cancer Father   . Diabetes Sister   . Heart disease Sister   . Heart attack Brother   . Heart disease Brother   . Diabetes Brother   . Diabetes Sister   . Diabetes Sister   . Pancreatic cancer Sister   . Diabetes Brother   . Heart disease  Brother   . Diabetes Brother   . Heart disease Brother   . Diabetes Brother   . Heart disease Brother   . Diabetes Brother   . Heart disease Brother   . Diabetes Brother   . Heart disease Brother   . Diabetes Brother      Review of Systems A comprehensive review of systems was negative.  Objective: Vital signs in last 24 hours: Temp:  [97.7 F (36.5 C)] 97.7 F (36.5 C) (06/08 0618) Pulse Rate:  [64] 64 (06/08 0618) Resp:  [20] 20 (06/08 0618) BP: (137)/(80) 137/80 mmHg (06/08 0618) SpO2:  [99 %] 99 % (06/08 0618) Weight:  [90.266 kg (199 lb)] 90.266 kg (199 lb) (06/08 0618)  EXAM: Patient is a well-developed well-nourished white female in no acute distress. Lungs are clear to auscultation , the patient has symmetrical respiratory excursion. Heart has a regular rate and rhythm normal S1 and S2 no murmur.   Abdomen is soft nontender nondistended bowel sounds are present. Extremity examination shows no clubbing cyanosis or edema. Motor examination shows 5 over 5 strength in the lower extremities including the iliopsoas quadriceps dorsiflexor extensor hallicus  longus and plantar flexor bilaterally. Sensation is intact to pinprick in the distal lower extremities. Reflexes are symmetrical bilaterally. No pathologic reflexes are present. Patient has a normal gait and stance.   Data Review:CBC    Component Value Date/Time   WBC 5.1 04/12/2016 1101   WBC 4.6 04/14/2009 1107   RBC 4.14 04/12/2016 1101   RBC 4.12 04/14/2009 1107   HGB 11.2* 04/12/2016 1101   HGB 12.6 04/14/2009 1107   HCT 35.7* 04/12/2016 1101   HCT 36.9 04/14/2009 1107   PLT 192 04/12/2016 1101   PLT 148 04/14/2009 1107   MCV 86.2 04/12/2016 1101   MCV 89.5 04/14/2009 1107   MCH 27.1 04/12/2016 1101   MCH 30.7 04/14/2009 1107   MCHC 31.4 04/12/2016 1101   MCHC 34.3 04/14/2009 1107   RDW 14.5 04/12/2016 1101   RDW 13.4 04/14/2009 1107   LYMPHSABS 1.3 03/28/2014 2150   LYMPHSABS 1.0 04/14/2009 1107    MONOABS 0.5 03/28/2014 2150  MONOABS 0.4 04/14/2009 1107   EOSABS 0.3 03/28/2014 2150   EOSABS 0.2 04/14/2009 1107   BASOSABS 0.0 03/28/2014 2150   BASOSABS 0.0 04/14/2009 1107                          BMET    Component Value Date/Time   NA 138 04/12/2016 1049   K 3.9 04/12/2016 1049   CL 104 04/12/2016 1049   CO2 25 04/12/2016 1049   GLUCOSE 129* 04/12/2016 1049   BUN 14 04/12/2016 1049   CREATININE 0.86 04/12/2016 1049   CREATININE 0.99* 03/24/2016 0944   CALCIUM 9.6 04/12/2016 1049   GFRNONAA >60 04/12/2016 1049   GFRAA >60 04/12/2016 1049     Assessment/Plan: Patient with advanced degeneration at the L4-5 level who is admitted for decompression and stabilization.  I've discussed with the patient the nature of his condition, the nature the surgical procedure, the typical length of surgery, hospital stay, and overall recuperation, the limitations postoperatively, and risks of surgery. I discussed risks including risks of infection, bleeding, possibly need for transfusion, the risk of nerve root dysfunction with pain, weakness, numbness, or paresthesias, the risk of dural tear and CSF leakage and possible need for further surgery, the risk of failure of the arthrodesis and possibly for further surgery, the risk of anesthetic complications including myocardial infarction, stroke, pneumonia, and death. We discussed the need for postoperative immobilization in a lumbar brace. Understanding all this the patient does wish to proceed with surgery and is admitted for such.     Hosie Spangle, MD 04/20/2016 7:19 AM

## 2016-04-20 NOTE — Progress Notes (Signed)
Filed Vitals:   04/20/16 1155 04/20/16 1214 04/20/16 1238 04/20/16 1552  BP: 125/60  117/52 101/42  Pulse: 71  68 66  Temp:  97.4 F (36.3 C)  97.4 F (36.3 C)  TempSrc:      Resp: 21 16 16 16   Height:      Weight:      SpO2: 89% 97% 95% 92%    Patient resting in bed, eating dinner. Has walked in the halls with the staff. Dressing clean and dry. Plan is to DC [place] after next walk this evening.  Plan: Continue to progress through postoperative recovery.  Hosie Spangle, MD 04/20/2016, 6:12 PM

## 2016-04-20 NOTE — Op Note (Signed)
04/20/2016  11:36 AM  PATIENT:  [name]  82 y.o. female  PRE-OPERATIVE DIAGNOSIS:  L4-5 lumbar stenosis with neurogenic claudication, grade 2-3 dynamic degenerative L4-L5 spondylolisthesis, lumbar spondylosis, lumbar degenerative disc disease  POST-OPERATIVE DIAGNOSIS:  L4-5 lumbar stenosis with neurogenic claudication, grade 2-3 dynamic degenerative L4-L5 spondylolisthesis, lumbar spondylosis, lumbar degenerative disc disease  PROCEDURE:  Procedure(s):  Bilateral L4-5 lumbar decompression including laminectomy, facetectomy, and foraminotomy, with decompression of the stenotic compression of the exiting L4 and L5 nerve roots bilaterally, with decompression beyond that required for interbody arthrodesis; bilateral L4-5 posterior lumbar interbody arthrodesis with AVS peek interbody implants, Vitoss BA with bone marrow aspirate, and infuse; bilateral L4-5 posterior lateral arthrodesis with nonsegmental radius posterior instrumentation, locally harvested morcellized autograft, Vitoss BA with bone marrow aspirate, and infuse  SURGEON:  Surgeon(s): Jovita Gamma, MD Erline Levine, MD  ASSISTANTS: Erline Levine, M.D.  ANESTHESIA:   general  EBL:  Total I/O In: 2200 [I.V.:1700; IV Piggyback:500] Out: 750 [Urine:500; Blood:250]  BLOOD ADMINISTERED:none  CELL SAVER GIVEN: Perfusionist felt that there was insufficient blood loss to return any blood to the patient.  COUNT: Correct per nursing staff  DICTATION: Patient is brought to the operating room placed under general endotracheal anesthesia. The patient was turned to prone position the lumbar region was prepped with Betadine soap and solution and draped in a sterile fashion. The midline was infiltrated with local anesthesia with epinephrine. A localizing x-ray was taken and then a midline incision was made carried down through the subcutaneous tissue, bipolar cautery and electrocautery were used to maintain hemostasis. Dissection was  carried down to the lumbar fascia. The fascia was incised bilaterally and the paraspinal muscles were dissected with a spinous process and lamina in a subperiosteal fashion. Another x-ray was taken for localization and the L4-5 level was localized. Dissection was then carried out laterally over the facet complex and the transverse processes of L4 and L5 were exposed and decorticated.  We then began the decompression. Bilateral L4-5 laminectomy was performed using the high-speed drill and Kerrison punches. Dissection was carried out laterally including facetectomy and foraminotomies with decompression of the stenotic compression of the L4 and L5 nerve roots. Once the decompression stenotic compression of the thecal sac and exiting nerve roots was completed we proceeded with the posterior lumbar interbody arthrodesis. The annulus was incised bilaterally and the disc space entered. A thorough discectomy was performed using pituitary rongeurs and curettes. Once the discectomy was completed we began to prepare the endplate surfaces removing the cartilaginous endplates surface. We then measured the height of the intervertebral disc space. We selected a 12 x 20 x 4 AVS peek interbody implants.  The C-arm fluoroscope was then draped and brought in the field and we identified the pedicle entry points bilaterally at the L4 and L5 levels. Each of the 4 pedicles was probed, we aspirated bone marrow aspirate from the vertebral bodies, this was injected over a 10 cc and a 5 cc strip of Vitoss BA. Then each of the pedicles was examined with the ball probe good bony surfaces were found and no bony cuts were found. Each of the pedicles was then tapped with a 5.25 mm tap, again examined with the ball probe good threading was found and no bony cuts were found. We then placed 5.75 by 45 millimeter screws bilaterally at the L4 level and 5.75 x 40 mm screws bilaterally at the L5 level..  We then packed the AVS peek interbody  implants with Vitoss  BA with bone marrow aspirate and infuse, and then placed the first implant and on the right side, carefully retracting the thecal sac and nerve root medially. We then went back to the left side and packed the midline with additional Vitoss BA with bone marrow aspirate and infuse, and then placed a second implant on the left side again retracting the thecal sac and nerve root medially. Additional Vitoss BA with bone marrow aspirate and infuse was packed lateral to the implants.  We then packed the lateral gutter over the transverse processes and intertransverse space with locally harvested morcellized autograft, Vitoss BA with bone marrow aspirate, and infuse. We then selected 30 mm pre-lordosed rods, they were placed within the screw heads and secured with locking caps once all 4 locking caps were placed final tightening was performed against a counter torque.  The wound had been irrigated multiple times during the procedure with saline solution and bacitracin solution, good hemostasis was established with a combination of bipolar cautery and Gelfoam with thrombin. Once good hemostasis was confirmed we proceeded with closure paraspinal muscles deep fascia and Scarpa's fascia were closed with interrupted undyed 1 Vicryl sutures the subcutaneous and subcuticular closed with interrupted inverted 2-0 undyed Vicryl sutures the skin edges were approximated with Dermabond.  The wound was stressed with sterile gauze and Hypafix.  Following surgery the patient was turned back to the supine position to be reversed and the anesthetic extubated and transferred to the recovery room for further care.  PLAN OF CARE: Admit for overnight observation  PATIENT DISPOSITION:  PACU - hemodynamically stable.   Delay start of Pharmacological VTE agent (>24hrs) due to surgical blood loss or risk of bleeding:  yes

## 2016-04-21 LAB — GLUCOSE, CAPILLARY
Glucose-Capillary: 161 mg/dL — ABNORMAL HIGH (ref 65–99)
Glucose-Capillary: 125 mg/dL — ABNORMAL HIGH (ref 65–99)
Glucose-Capillary: 146 mg/dL — ABNORMAL HIGH (ref 65–99)

## 2016-04-21 MED ORDER — HYDROCODONE-ACETAMINOPHEN 5-325 MG PO TABS: 1.0000 | ORAL_TABLET | ORAL | Status: DC | PRN

## 2016-04-21 NOTE — Progress Notes (Signed)
Patient alert and oriented, mae's well, voiding adequate amount of urine, swallowing without difficulty, no c/o pain but soreness around incision area. Patient discharged home with family. Script and discharged instructions given to patient. Patient and family stated understanding of d/c instructions given and has an appointment with MD.

## 2016-04-21 NOTE — Progress Notes (Signed)
Filed Vitals:   04/20/16 2016 04/20/16 2303 04/20/16 2315 04/21/16 0401  BP: 117/51  121/76 127/55  Pulse: 65 70 68 64  Temp: 98.1 F (36.7 C)  98.1 F (36.7 C) 99.4 F (37.4 C)  TempSrc: Oral  Oral Oral  Resp: 18 18 18 18   Height:      Weight:      SpO2: 96% 95% 95% 97%    Patient resting in bed comfortably. Excellent relief of right lumbar radicular pain. Dressing clean and dry. Has been ambulating with the staff and has steadily become more stable. Voiding well since Foley DC'd.  Plan: Encouraged to ambulate at least 5 times in the halls with the staff today. Continue to progress through postoperative recovery.  Hosie Spangle, MD 04/21/2016, 7:23 AM

## 2016-04-21 NOTE — Discharge Instructions (Signed)

## 2016-04-21 NOTE — Discharge Summary (Signed)
Physician Discharge Summary  Patient ID: [name] MRN: [id number] DOB/AGE: July 30, 1931 82 y.o.  Admit date: 04/20/2016 Discharge date: 04/21/2016  Admission Diagnoses:  L4-5 lumbar stenosis with neurogenic claudication, grade 2-3 dynamic degenerative L4-L5 spondylolisthesis, lumbar spondylosis, lumbar degenerative disc disease  Discharge Diagnoses:  L4-5 lumbar stenosis with neurogenic claudication, grade 2-3 dynamic degenerative L4-L5 spondylolisthesis, lumbar spondylosis, lumbar degenerative disc disease Active Problems:   Lumbar stenosis with neurogenic claudication   Discharged Condition: good  Hospital Course:   Patient was admitted, underwent a L4-5 lumbar decompression, PLIF, and PLA. She is done well following surgery. She is up and ambulating actively. Her Foley was DC'd, and she is voiding well. Her dressing was removed, and her incision is healing nicely. There is no erythema, swelling, or drainage. She is being discharged to home with instructions regarding wound care and activities, which have been also explained to her daughter and son-in-law. She is to follow-up with me in the office in 3 weeks.  Discharge Exam: Blood pressure 128/57, pulse 59, temperature 98.2 F (36.8 C), temperature source Oral, resp. rate 18, height 5' 5.5" (1.664 m), weight 90.266 kg (199 lb), SpO2 97 %.  Disposition: 01-Home or Self Care     Medication List    TAKE these medications        acetaminophen 650 MG CR tablet  Commonly known as:  TYLENOL  Take 1,300 mg by mouth every 8 (eight) hours.     amiodarone 200 MG tablet  Commonly known as:  PACERONE  Take 1 tablet (200 mg total) by mouth daily.     B-12 PO  Take 1 tablet by mouth daily as needed (for energy).     calcium citrate-vitamin D 315-200 MG-UNIT tablet  Commonly known as:  CITRACAL+D  Take 1 tablet by mouth 2 (two) times daily.     diltiazem 360 MG 24 hr capsule  Commonly known as:  CARDIZEM CD  Take 1 capsule (360 mg  total) by mouth daily.     ELIQUIS 5 MG Tabs tablet  Generic drug:  apixaban  Take 1 tablet by mouth two  times daily     fluticasone 50 MCG/ACT nasal spray  Commonly known as:  FLONASE  Place 1 spray into both nostrils daily. Reported on 02/25/2016     HYDROcodone-acetaminophen 5-325 MG tablet  Commonly known as:  NORCO/VICODIN  Take 1-2 tablets by mouth every 4 (four) hours as needed (mild pain).     lansoprazole 30 MG capsule  Commonly known as:  PREVACID  Take 30 mg by mouth every morning.     levothyroxine 150 MCG tablet  Commonly known as:  SYNTHROID, LEVOTHROID  Take 150 mcg by mouth daily before breakfast.     LORazepam 1 MG tablet  Commonly known as:  ATIVAN  Take 1.5 mg by mouth at bedtime.     losartan-hydrochlorothiazide 50-12.5 MG tablet  Commonly known as:  HYZAAR  Take 1 tablet by mouth 2 (two) times daily.     LUMIGAN 0.01 % Soln  Generic drug:  bimatoprost  Place 1 drop into both eyes at bedtime. Eye drop     Lutein 6 MG Caps  Take 6 mg by mouth 2 (two) times daily as needed (for vegetable support).     magnesium oxide 400 MG tablet  Commonly known as:  MAG-OX  Take 400 mg by mouth 2 (two) times daily.     metFORMIN 500 MG (MOD) 24 hr tablet  Commonly known as:  GLUMETZA  Take 500 mg by mouth 2 (two) times daily with a meal.     multivitamin-lutein Caps capsule  Take 1 capsule by mouth daily.     ONE TOUCH ULTRA TEST test strip  Generic drug:  glucose blood     ONETOUCH DELICA LANCETS 99991111 Misc     potassium chloride SA 20 MEQ tablet  Commonly known as:  K-DUR,KLOR-CON  Take 3 tablets (60 mEq total) by mouth daily.     rosuvastatin 20 MG tablet  Commonly known as:  CRESTOR  Take 10 mg by mouth at bedtime.     sertraline 50 MG tablet  Commonly known as:  ZOLOFT  Take 50 mg by mouth daily.     TANDEM PLUS 162-115.2-1 MG Caps  Take 1 tablet by mouth daily as needed (low iron).         SignedHosie Spangle 04/21/2016, 6:21  PM

## 2016-05-20 ENCOUNTER — Other Ambulatory Visit: Payer: Self-pay | Admitting: Cardiovascular Disease

## 2016-07-29 ENCOUNTER — Other Ambulatory Visit: Payer: Self-pay | Admitting: Cardiovascular Disease

## 2016-08-08 ENCOUNTER — Other Ambulatory Visit: Payer: Self-pay | Admitting: Cardiovascular Disease

## 2016-09-14 ENCOUNTER — Ambulatory Visit
Admission: RE | Admit: 2016-09-14 | Discharge: 2016-09-14 | Disposition: A | Discharge status: Home / Self Care | Payer: Medicare Other | Source: Ambulatory Visit | Attending: Nurse Practitioner | Admitting: Nurse Practitioner

## 2016-09-14 ENCOUNTER — Other Ambulatory Visit: Payer: Self-pay | Admitting: Nurse Practitioner

## 2016-09-14 DIAGNOSIS — R059 Cough, unspecified: Secondary | ICD-10-CM

## 2016-09-14 DIAGNOSIS — R05 Cough: Secondary | ICD-10-CM

## 2016-10-13 ENCOUNTER — Other Ambulatory Visit: Payer: Self-pay | Admitting: Cardiovascular Disease

## 2016-10-31 ENCOUNTER — Other Ambulatory Visit: Payer: Medicare Other

## 2016-11-01 ENCOUNTER — Other Ambulatory Visit: Payer: Medicare Other | Admitting: *Deleted

## 2016-11-01 DIAGNOSIS — I10 Essential (primary) hypertension: Secondary | ICD-10-CM

## 2016-11-01 DIAGNOSIS — I4891 Unspecified atrial fibrillation: Secondary | ICD-10-CM

## 2016-11-01 DIAGNOSIS — E7800 Pure hypercholesterolemia, unspecified: Secondary | ICD-10-CM

## 2016-11-01 LAB — CBC WITH DIFFERENTIAL/PLATELET
BASOS ABS: 0 {cells}/uL (ref 0–200)
Basophils Relative: 0 %
Eosinophils Absolute: 208 cells/uL (ref 15–500)
Eosinophils Relative: 4 %
HEMATOCRIT: 31.3 % — AB (ref 35.0–45.0)
HEMOGLOBIN: 9.8 g/dL — AB (ref 11.7–15.5)
LYMPHS ABS: 936 {cells}/uL (ref 850–3900)
Lymphocytes Relative: 18 %
MCH: 24.7 pg — AB (ref 27.0–33.0)
MCHC: 31.3 g/dL — AB (ref 32.0–36.0)
MCV: 79 fL — ABNORMAL LOW (ref 80.0–100.0)
MONO ABS: 572 {cells}/uL (ref 200–950)
MPV: 10 fL (ref 7.5–12.5)
Monocytes Relative: 11 %
NEUTROS ABS: 3484 {cells}/uL (ref 1500–7800)
NEUTROS PCT: 67 %
Platelets: 221 10*3/uL (ref 140–400)
RBC: 3.96 MIL/uL (ref 3.80–5.10)
RDW: 16.4 % — ABNORMAL HIGH (ref 11.0–15.0)
WBC: 5.2 10*3/uL (ref 3.8–10.8)

## 2016-11-01 LAB — HEPATIC FUNCTION PANEL
ALK PHOS: 61 U/L (ref 33–130)
ALT: 14 U/L (ref 6–29)
AST: 16 U/L (ref 10–35)
Albumin: 3.8 g/dL (ref 3.6–5.1)
BILIRUBIN DIRECT: 0.1 mg/dL (ref <=0.2)
BILIRUBIN INDIRECT: 0.3 mg/dL (ref 0.2–1.2)
BILIRUBIN TOTAL: 0.4 mg/dL (ref 0.2–1.2)
Total Protein: 5.9 g/dL — ABNORMAL LOW (ref 6.1–8.1)

## 2016-11-01 LAB — BASIC METABOLIC PANEL
BUN: 18 mg/dL (ref 7–25)
CHLORIDE: 102 mmol/L (ref 98–110)
CO2: 26 mmol/L (ref 20–31)
Calcium: 9 mg/dL (ref 8.6–10.4)
Creat: 0.98 mg/dL — ABNORMAL HIGH (ref 0.60–0.88)
Glucose, Bld: 122 mg/dL — ABNORMAL HIGH (ref 65–99)
POTASSIUM: 3.8 mmol/L (ref 3.5–5.3)
SODIUM: 138 mmol/L (ref 135–146)

## 2016-11-01 LAB — TSH: TSH: 5.49 mIU/L — ABNORMAL HIGH

## 2016-11-01 NOTE — Addendum Note (Signed)
Addended by: Eulis Foster on: 11/01/2016 10:43 AM   Modules accepted: Orders

## 2016-11-07 ENCOUNTER — Ambulatory Visit (INDEPENDENT_AMBULATORY_CARE_PROVIDER_SITE_OTHER): Payer: Medicare Other | Admitting: Cardiovascular Disease

## 2016-11-07 ENCOUNTER — Encounter: Payer: Self-pay | Admitting: Cardiovascular Disease

## 2016-11-07 VITALS — BP 120/60 | HR 68 | Ht 66.0 in | Wt 200.1 lb

## 2016-11-07 DIAGNOSIS — I10 Essential (primary) hypertension: Secondary | ICD-10-CM

## 2016-11-07 DIAGNOSIS — I480 Paroxysmal atrial fibrillation: Secondary | ICD-10-CM

## 2016-11-07 MED ORDER — DILTIAZEM HCL ER COATED BEADS 240 MG PO CP24: 240.0000 mg | ORAL_CAPSULE | Freq: Every day | ORAL | 3 refills | Status: DC

## 2016-11-07 NOTE — Patient Instructions (Addendum)
Medication Instructions:  Your physician has recommended you make the following change in your medication:  1. DECREASE Cardizem CD to 240mg  once daily  Labwork: No new orders.   Recent labs faxed to Dr Delene Ruffini office for upcoming appointment.  Testing/Procedures: No new orders.   Follow-Up: Your physician wants you to follow-up in: 6 MONTHS with Dr Burt Knack.  You will receive a reminder letter in the mail two months in advance. If you don't receive a letter, please call our office to schedule the follow-up appointment.  Any Other Special Instructions Will Be Listed Below (If Applicable).     If you need a refill on your cardiac medications before your next appointment, please call your pharmacy.

## 2016-11-07 NOTE — Progress Notes (Signed)
Cardiology Office Note Date:  11/07/2016   ID:  [name], DOB 07-Jul-1931, MRN [id number]  PCP:  Irven Shelling, MD  Cardiologist:  Sherren Mocha, MD    Chief Complaint  Patient presents with  . Atrial Fibrillation     History of Present Illness: [name] is a 82 y.o. female who presents for Follow-up of paroxysmal atrial fibrillation. Patient has a history of cardioversion and now requires amiodarone for rhythm control. She's been anticoagulated with Eliquis. CHADS-Vasc score is 59 (female gender, age 55-85, hypertension, diabetes).  The patient is doing fairly well. She is here alone today. She complains of a deep, nonproductive cough. She's been dealing with this for the past few months. No shortness of breath, fever, chills, or chest pain. No heart palpitations. She complains of gait unsteadiness and dizziness. She's had this problem for some time, but feels like it's been worse lately. No syncope or near syncope. No bleeding problems reported.  Past Medical History:  Diagnosis Date  . Allergy    SEASONAL  . Anemia    years ago  . Arthritis    "all my joints" (03/31/2014)  . Atrial fibrillation ([place])   . Breast cancer [facility])    s/p right breast lumpectomy  . Cervical cancer ([place])   . DJD (degenerative joint disease) of knee   . Dysrhythmia    Afib  . GERD (gastroesophageal reflux disease)   . Glaucoma   . H/O hiatal hernia   . Heart murmur   . Hx of transesophageal echocardiography (TEE) for monitoring    TEE (03/2014): No LAA clot, moderate LAE, core triatriatum type structure in RA with no stenosis, normal EF 55-60%, mild MR  . Hypertension   . Hypothyroidism, postsurgical   . Migraine    "last one was years ago" (03/31/2014)  . OSA on CPAP   . PONV (postoperative nausea and vomiting)   . Sleep apnea   . Thyroid cancer [facility])    s/p thyroidectomy  . Type II diabetes mellitus ([place])     Past Surgical History:  Procedure Laterality Date  .  ABDOMINAL HYSTERECTOMY     "partial"  . BREAST BIOPSY Right   . BREAST LUMPECTOMY Right   . CARDIOVERSION N/A 03/30/2014   Procedure: CARDIOVERSION - BEDSIDE;  Surgeon: [name], MD;  Location: [place];  Service: Cardiovascular;  Laterality: N/A;  . CARDIOVERSION N/A 03/31/2014   Procedure: CARDIOVERSION  (BEDSIDE) ;  Surgeon: Lelon Perla, MD;  Location: [place];  Service: Cardiovascular;  Laterality: N/A;  . CARDIOVERSION N/A 04/27/2014   Procedure: CARDIOVERSION;  Surgeon: Darlin Coco, MD;  Location[facility] ENDOSCOPY;  Service: Cardiovascular;  Laterality: N/A;  . CARDIOVERSION N/A 04/13/2015   Procedure: CARDIOVERSION;  Surgeon: Thayer Headings, MD;  Location: [place];  Service: Cardiovascular;  Laterality: N/A;  . CARPAL TUNNEL RELEASE Right   . CATARACT EXTRACTION W/ INTRAOCULAR LENS  IMPLANT, BILATERAL Bilateral   . COLONOSCOPY    . EXCISIONAL HEMORRHOIDECTOMY    . POLYPECTOMY    . TEE WITHOUT CARDIOVERSION N/A 03/30/2014   Procedure: TRANSESOPHAGEAL ECHOCARDIOGRAM (TEE);  Surgeon: [name], MD;  Location[facility] ENDOSCOPY;  Service: Cardiovascular;  Laterality: N/A;  . THYROIDECTOMY    . TONSILLECTOMY      Current Outpatient Prescriptions  Medication Sig Dispense Refill  . acetaminophen (TYLENOL) 650 MG CR tablet Take 1,300 mg by mouth every 8 (eight) hours.    Marland Kitchen amiodarone (PACERONE) 200 MG tablet TAKE 1  TABLET BY MOUTH  DAILY 90 tablet 0  . bimatoprost (LUMIGAN) 0.01 % SOLN Place 1 drop into both eyes at bedtime. Eye drop    . calcium citrate-vitamin D (CITRACAL+D) 315-200 MG-UNIT tablet Take 1 tablet by mouth 2 (two) times daily.     . Cyanocobalamin (B-12 PO) Take 1 tablet by mouth daily as needed (for energy).     Marland Kitchen diltiazem (CARDIZEM CD) 240 MG 24 hr capsule Take 1 capsule (240 mg total) by mouth daily. 90 capsule 3  . ELIQUIS 5 MG TABS tablet TAKE 1 TABLET BY MOUTH TWO  TIMES DAILY 180 tablet 3  . FeFum-FePo-FA-B Cmp-C-Zn-Mn-Cu (TANDEM PLUS) 162-115.2-1 MG  CAPS Take 1 tablet by mouth daily as needed (low iron).     . fluticasone (FLONASE) 50 MCG/ACT nasal spray Place 1 spray into both nostrils daily. Reported on 02/25/2016    . lansoprazole (PREVACID) 30 MG capsule Take 30 mg by mouth every morning.    Marland Kitchen levothyroxine (SYNTHROID, LEVOTHROID) 150 MCG tablet Take 150 mcg by mouth daily before breakfast.    . LORazepam (ATIVAN) 1 MG tablet Take 1.5 mg by mouth at bedtime.    Marland Kitchen losartan-hydrochlorothiazide (HYZAAR) 50-12.5 MG tablet Take 1 tablet by mouth  twice a day 180 tablet 2  . Lutein 6 MG CAPS Take 6 mg by mouth 2 (two) times daily as needed (for vegetable support).    . magnesium oxide (MAG-OX) 400 MG tablet Take 400 mg by mouth 2 (two) times daily.    . metFORMIN (GLUMETZA) 500 MG (MOD) 24 hr tablet Take 500 mg by mouth 2 (two) times daily with a meal.     . multivitamin-lutein (OCUVITE-LUTEIN) CAPS capsule Take 1 capsule by mouth daily.    . ONE TOUCH ULTRA TEST test strip Use as directed    . ONETOUCH DELICA LANCETS 99991111 MISC Use as directed    . potassium chloride SA (K-DUR,KLOR-CON) 20 MEQ tablet Take 3 tablets (60 mEq total) by mouth daily. 270 tablet 3  . rosuvastatin (CRESTOR) 20 MG tablet Take 10 mg by mouth at bedtime.    . sertraline (ZOLOFT) 50 MG tablet Take 50 mg by mouth daily.     No current facility-administered medications for this visit.     Allergies:   Penicillins   Social History:  The patient  reports that she has never smoked. She has never used smokeless tobacco. She reports that she does not drink alcohol or use drugs.   Family History:  The patient's  family history includes Diabetes in her brother, brother, brother, brother, brother, brother, brother, sister, sister, and sister; Heart attack in her brother; Heart disease in her brother, brother, brother, brother, brother, brother, and sister; Pancreatic cancer in her sister; Prostate cancer in her father.   ROS:  Please see the history of present illness.   Otherwise, review of systems is positive for Cough, balance problems, constipation.  All other systems are reviewed and negative.   PHYSICAL EXAM: VS:  BP 120/60   Pulse 68   Ht 5\' 6"  (1.676 m)   Wt 200 lb 1.9 oz (90.8 kg)   BMI 32.30 kg/m  , BMI Body mass index is 32.3 kg/m. GEN: Well nourished, well developed, delightful elderly woman in no acute distress  HEENT: normal  Neck: no JVD, no masses. No carotid bruits Cardiac: RRR with 2/6 systolic murmur at the RUSB        Respiratory:  clear to auscultation bilaterally, normal work of breathing  GI: soft, nontender, nondistended, + BS MS: no deformity or atrophy  Ext: no pretibial edema, pedal pulses 2+= bilaterally Skin: warm and dry, no rash Neuro:  Strength and sensation are intact Psych: euthymic mood, full affect  EKG:  EKG is ordered today. The ekg ordered today shows NSR 69 bpm, within normal limits  Recent Labs: 11/01/2016: ALT 14; BUN 18; Creat 0.98; Hemoglobin 9.8; Platelets 221; Potassium 3.8; Sodium 138; TSH 5.49   Lipid Panel  No results found for: CHOL, TRIG, HDL, CHOLHDL, VLDL, LDLCALC, LDLDIRECT    Wt Readings from Last 3 Encounters:  11/07/16 200 lb 1.9 oz (90.8 kg)  04/20/16 199 lb (90.3 kg)  04/12/16 199 lb 11.2 oz (90.6 kg)    2D Echo 04-27-2015: Study Conclusions  - Left ventricle: The cavity size was normal. There was severe   focal basal and moderate concentric hypertrophy of the left   ventricle. Systolic function was vigorous. The estimated ejection   fraction was in the range of 65% to 70%. Wall motion was normal;   there were no regional wall motion abnormalities. Features are   consistent with a pseudonormal left ventricular filling pattern,   with concomitant abnormal relaxation and increased filling   pressure (grade 2 diastolic dysfunction). Doppler parameters are   consistent with elevated ventricular end-diastolic filling   pressure. - Aortic valve: There was mild regurgitation. -  Aortic root: The aortic root was normal in size. - Left atrium: The atrium was moderately dilated. - Right ventricle: Systolic function was normal. - Right atrium: The atrium was normal in size. - Tricuspid valve: There was mild regurgitation. - Pulmonic valve: There was no regurgitation. - Pulmonary arteries: Systolic pressure was within the normal   range. PA peak pressure: 31 mm Hg (S). - Inferior vena cava: The vessel was normal in size. - Pericardium, extracardiac: There was no pericardial effusion.  ASSESSMENT AND PLAN: 1.  Paroxysmal Atrial Fibrillation: Maintaining sinus rhythm on amiodarone. Tolerating anticoagulation with Eliquis. She's had recurrent atrial fibrillation with dose reduction of her amiodarone in the past. We'll continue 200 mg daily. I will reduce Cardizem CD to 240 mg daily to see if this has any impact on her unsteadiness/dizziness.  Lab data reviewed:   TSH is mildly elevated at 5.49. The patient takes levothyroxin 150 g daily. Will forward to Dr. Laurann Montana to see if any changes are required.  Hemoglobin is 9.8 mg/dL. In May her hemoglobin was 11.2. Denies any symptoms suggestive of bleeding. She will increase her iron supplementation and follow-up with Dr. Laurann Montana.  2. Essential hypertension: Blood pressure well controlled. Diltiazem will be reduced as above.  3. Type 2 diabetes: Followed by Dr. Laurann Montana. Treated with metformin.  4. Hyperlipidemia: Treated with Crestor.  Current medicines are reviewed with the patient today.  The patient does not have concerns regarding medicines.  Labs/ tests ordered today include:   Orders Placed This Encounter  Procedures  . EKG 12-Lead   Disposition:   FU 6 months  Signed, Sherren Mocha, MD  11/07/2016 11:46 AM    [facility] [address], [place], [place]  [address] Phone: [phone]; Fax: [phone]

## 2016-11-28 ENCOUNTER — Telehealth: Payer: Self-pay | Admitting: Cardiovascular Disease

## 2016-11-28 NOTE — Telephone Encounter (Signed)
I spoke with the pt and she complains of a "deep cough" that has been ongoing for a month.  The pt saw her PCP and she is on her 3rd day of doxycycline (10 day course).  The pt would like to know what she can take OTC for cough. I advised her she can try Robitussin DM or Coricidin HBP.  The pt plans to follow-up with PCP if she does not have improvement in symptoms.

## 2016-11-28 NOTE — Telephone Encounter (Signed)
[name] is calling to find out if [name] will tell her what kind of cough syrup can she take . Please call

## 2016-12-18 ENCOUNTER — Telehealth: Payer: Self-pay | Admitting: Cardiovascular Disease

## 2016-12-18 NOTE — Telephone Encounter (Signed)
Pt is due to follow-up with Dr Burt Knack in June.  Amiodarone labs were checked in December 2017.  I will forward this note to Dr Burt Knack to give orders for repeat lab work in June if needed.

## 2016-12-18 NOTE — Telephone Encounter (Signed)
New Message   Per pt she is supposed to have lab work done before appt. No current order in.

## 2016-12-19 NOTE — Telephone Encounter (Signed)
I spoke with the pt and made her aware that we will plan to draw lab work the same day as her office visit with Dr Burt Knack. Comments placed in appointment notes.

## 2016-12-19 NOTE — Telephone Encounter (Signed)
Agree should repeat thyroid and liver function studies. thanks

## 2016-12-23 ENCOUNTER — Telehealth: Payer: Self-pay | Admitting: Student

## 2016-12-23 NOTE — Telephone Encounter (Signed)
  Patient reports she has been having palpitations for the past several days, HR in the 90's - 120's. Denies any associated dyspnea, chest discomfort, lightheadedness, or presyncope.   She self-increased her Cardizem CD from 240mg  daily to 360mg  daily (had left over tablets still in-date) with some improvement in her symptoms. She reports good compliance with her Amiodarone and Eliquis.  I advised her to continue taking her increased Cardizem dosing of 360mg  daily. Will hold her Losartan-HCTZ if SBP < 120.  Was informed to come to the Emergency Department if she develops chest discomfort or dyspnea. The patient strongly wishes to avoid coming to the hospital due to concerns regarding the flu.  Will forward message to Triage asking them to call the patient on Monday and arrange an office visit within the next week for further adjustment of her medication regimen.   The patient voiced understanding of this and was appreciative of the call.  Signed, Erma Heritage, PA-C 12/23/2016, 11:51 AM Pager: [phone]

## 2016-12-25 ENCOUNTER — Telehealth: Payer: Self-pay | Admitting: Cardiovascular Disease

## 2016-12-25 NOTE — Telephone Encounter (Signed)
I spoke with the pt and advised her that the appointment offered on 12/27/16 at 11:00 is the first available APP appointment this week.  The pt agreed to take this appointment time.

## 2016-12-25 NOTE — Telephone Encounter (Signed)
New message    Pt chart message: Triage asking them to call the patient on Monday and arrange an office visit within the next week for further adjustment of her medication regimen.   The patient voiced understanding of this and was appreciative of the call.  Signed, Erma Heritage, PA-C 12/23/2016, 11:51 AM   Pt verbalized that she is waiting in RN to call per speaking with oncall staff:  Offered pt 12/27/2016 with Bhagat at 11am 72 hour spot pt declined and wanted something sooner. She wants rn to call her today.

## 2016-12-25 NOTE — Progress Notes (Signed)
Cardiology Office Note    Date:  12/27/2016   ID:  [name], DOB May 10, 1931, MRN [id number]  PCP:  Irven Shelling, MD  Cardiologist:  [name]  Chief Complaint: Palpitations   History of Present Illness:   [name] is a 83 y.o. female with hx of PAF, DM, HTN, OSA on CPAP presents for palpitations.  Patient has a history of cardioversion and now requires amiodarone for rhythm control. She's been anticoagulated with Eliquis. CHADS-Vasc score is 20 (female gender, age 7-85, hypertension, diabetes).  Last seen by [name] 11/07/16. Reduced cardizem CD to 240mg  qd. TSH elevated to 5.49 on supplement. Advised to follow up with PCP.   Per telephone note 12/23/16. Noted HR of 90-120s. She self-increased her Cardizem CD from 240mg  daily to 360mg  daily (had left over tablets still in-date) with some improvement in her symptoms. She reports good compliance with her Amiodarone and Eliquis. Advised her to continue taking her increased Cardizem dosing of 360mg  daily. Will hold her Losartan-HCTZ if SBP < 120.  Here today for follow up. Her rate mostly in 120s to 130s on her dose of Cardizem. She is fatigue, tired, short of breath and mild lower extremity edema. No syncope and dizziness. Compliant with medications. She did not took her losartan today. Seen by PCP for abnormal thyroid --> continue synthtoud 150 mcg. Plan to check in few weeks.   Past Medical History:  Diagnosis Date  . Allergy    SEASONAL  . Anemia    years ago  . Arthritis    "all my joints" (03/31/2014)  . Atrial fibrillation ([place])   . Breast cancer [facility])    s/p right breast lumpectomy  . Cervical cancer ([place])   . DJD (degenerative joint disease) of knee   . Dysrhythmia    Afib  . GERD (gastroesophageal reflux disease)   . Glaucoma   . H/O hiatal hernia   . Heart murmur   . Hx of transesophageal echocardiography (TEE) for monitoring    TEE (03/2014): No LAA clot, moderate LAE, core triatriatum type  structure in RA with no stenosis, normal EF 55-60%, mild MR  . Hypertension   . Hypothyroidism, postsurgical   . Migraine    "last one was years ago" (03/31/2014)  . OSA on CPAP   . PONV (postoperative nausea and vomiting)   . Sleep apnea   . Thyroid cancer [facility])    s/p thyroidectomy  . Type II diabetes mellitus ([place])     Past Surgical History:  Procedure Laterality Date  . ABDOMINAL HYSTERECTOMY     "partial"  . BREAST BIOPSY Right   . BREAST LUMPECTOMY Right   . CARDIOVERSION N/A 03/30/2014   Procedure: CARDIOVERSION - BEDSIDE;  Surgeon: [name], MD;  Location: [place];  Service: Cardiovascular;  Laterality: N/A;  . CARDIOVERSION N/A 03/31/2014   Procedure: CARDIOVERSION  (BEDSIDE) ;  Surgeon: Lelon Perla, MD;  Location: [place];  Service: Cardiovascular;  Laterality: N/A;  . CARDIOVERSION N/A 04/27/2014   Procedure: CARDIOVERSION;  Surgeon: Darlin Coco, MD;  Location: [facility] ENDOSCOPY;  Service: Cardiovascular;  Laterality: N/A;  . CARDIOVERSION N/A 04/13/2015   Procedure: CARDIOVERSION;  Surgeon: Thayer Headings, MD;  Location: [place];  Service: Cardiovascular;  Laterality: N/A;  . CARPAL TUNNEL RELEASE Right   . CATARACT EXTRACTION W/ INTRAOCULAR LENS  IMPLANT, BILATERAL Bilateral   . COLONOSCOPY    . EXCISIONAL HEMORRHOIDECTOMY    . POLYPECTOMY    .  TEE WITHOUT CARDIOVERSION N/A 03/30/2014   Procedure: TRANSESOPHAGEAL ECHOCARDIOGRAM (TEE);  Surgeon: [name], MD;  Location: [facility] ENDOSCOPY;  Service: Cardiovascular;  Laterality: N/A;  . THYROIDECTOMY    . TONSILLECTOMY      Current Medications: Prior to Admission medications   Medication Sig Start Date End Date Taking? Authorizing Provider  acetaminophen (TYLENOL) 650 MG CR tablet Take 1,300 mg by mouth every 8 (eight) hours.    Historical Provider, MD  amiodarone (PACERONE) 200 MG tablet TAKE 1 TABLET BY MOUTH  DAILY 10/16/16   Sherren Mocha, MD  bimatoprost (LUMIGAN) 0.01 % SOLN Place 1 drop into both  eyes at bedtime. Eye drop 10/23/12   Historical Provider, MD  calcium citrate-vitamin D (CITRACAL+D) 315-200 MG-UNIT tablet Take 1 tablet by mouth 2 (two) times daily.     Historical Provider, MD  Cyanocobalamin (B-12 PO) Take 1 tablet by mouth daily as needed (for energy).     Historical Provider, MD  diltiazem (CARDIZEM CD) 240 MG 24 hr capsule Take 1 capsule (240 mg total) by mouth daily. 11/07/16   Sherren Mocha, MD  ELIQUIS 5 MG TABS tablet TAKE 1 TABLET BY MOUTH TWO  TIMES DAILY 08/08/16   Sherren Mocha, MD  FeFum-FePo-FA-B Cmp-C-Zn-Mn-Cu (TANDEM PLUS) 162-115.2-1 MG CAPS Take 1 tablet by mouth daily as needed (low iron).     Historical Provider, MD  fluticasone (FLONASE) 50 MCG/ACT nasal spray Place 1 spray into both nostrils daily. Reported on 02/25/2016    Historical Provider, MD  lansoprazole (PREVACID) 30 MG capsule Take 30 mg by mouth every morning.    Historical Provider, MD  levothyroxine (SYNTHROID, LEVOTHROID) 150 MCG tablet Take 150 mcg by mouth daily before breakfast.    Historical Provider, MD  LORazepam (ATIVAN) 1 MG tablet Take 1.5 mg by mouth at bedtime.    Historical Provider, MD  losartan-hydrochlorothiazide [facility]) 50-12.5 MG tablet Take 1 tablet by mouth  twice a day 07/31/16   Sherren Mocha, MD  Lutein 6 MG CAPS Take 6 mg by mouth 2 (two) times daily as needed (for vegetable support).    Historical Provider, MD  magnesium oxide (MAG-OX) 400 MG tablet Take 400 mg by mouth 2 (two) times daily.    Historical Provider, MD  metFORMIN (GLUMETZA) 500 MG (MOD) 24 hr tablet Take 500 mg by mouth 2 (two) times daily with a meal.     Historical Provider, MD  multivitamin-lutein (OCUVITE-LUTEIN) CAPS capsule Take 1 capsule by mouth daily.    Historical Provider, MD  ONE TOUCH ULTRA TEST test strip Use as directed 02/16/15   Historical Provider, MD  Jonetta Speak LANCETS [id number] MISC Use as directed 02/16/15   Historical Provider, MD  potassium chloride SA (K-DUR,KLOR-CON) 20 MEQ tablet Take  3 tablets (60 mEq total) by mouth daily. 03/24/16   Sherren Mocha, MD  rosuvastatin (CRESTOR) 20 MG tablet Take 10 mg by mouth at bedtime.    Historical Provider, MD  sertraline (ZOLOFT) 50 MG tablet Take 50 mg by mouth daily.    Historical Provider, MD    Allergies:   Penicillins   Social History   Social History  . Marital status: Divorced    Spouse name: N/A  . Number of children: 2  . Years of education: N/A   Occupational History  . Retired    Social History Main Topics  . Smoking status: Never Smoker  . Smokeless tobacco: Never Used  . Alcohol use No  . Drug use: No  . Sexual  activity: No   Other Topics Concern  . None   Social History Narrative  . None     Family History:  The patient's family history includes Diabetes in her brother, brother, brother, brother, brother, brother, brother, sister, sister, and sister; Heart attack in her brother; Heart disease in her brother, brother, brother, brother, brother, brother, and sister; Pancreatic cancer in her sister; Prostate cancer in her father.   ROS:   Please see the history of present illness.    ROS All other systems reviewed and are negative.   PHYSICAL EXAM:   VS:  BP 130/70   Pulse (!) 122   Ht 5\' 6"  (1.676 m)   Wt 195 lb 12.8 oz (88.8 kg)   BMI 31.60 kg/m    GEN: Well nourished, well developed, in no acute distress  HEENT: normal  Neck: no JVD, carotid bruits, or masses Cardiac: Ir Ir tachycardia ; no murmurs, rubs, or gallops,trace  edema  Respiratory:  clear to auscultation bilaterally, normal work of breathing GI: soft, nontender, nondistended, + BS MS: no deformity or atrophy  Skin: warm and dry, no rash Neuro:  Alert and Oriented x 3, Strength and sensation are intact Psych: euthymic mood, full affect  Wt Readings from Last 3 Encounters:  12/27/16 195 lb 12.8 oz (88.8 kg)  11/07/16 200 lb 1.9 oz (90.8 kg)  04/20/16 199 lb (90.3 kg)      Studies/Labs Reviewed:   EKG:  EKG is ordered  today.  The ekg ordered today demonstrates afib at rate of 122 bpm  Recent Labs: 11/01/2016: ALT 14; BUN 18; Creat 0.98; Hemoglobin 9.8; Platelets 221; Potassium 3.8; Sodium 138; TSH 5.49   Lipid Panel No results found for: CHOL, TRIG, HDL, CHOLHDL, VLDL, LDLCALC, LDLDIRECT  Additional studies/ records that were reviewed today include:   Echocardiogram: 04/27/15 Study Conclusions  - Left ventricle: The cavity size was normal. There was severe   focal basal and moderate concentric hypertrophy of the left   ventricle. Systolic function was vigorous. The estimated ejection   fraction was in the range of 65% to 70%. Wall motion was normal;   there were no regional wall motion abnormalities. Features are   consistent with a pseudonormal left ventricular filling pattern,   with concomitant abnormal relaxation and increased filling   pressure (grade 2 diastolic dysfunction). Doppler parameters are   consistent with elevated ventricular end-diastolic filling   pressure. - Aortic valve: There was mild regurgitation. - Aortic root: The aortic root was normal in size. - Left atrium: The atrium was moderately dilated. - Right ventricle: Systolic function was normal. - Right atrium: The atrium was normal in size. - Tricuspid valve: There was mild regurgitation. - Pulmonic valve: There was no regurgitation. - Pulmonary arteries: Systolic pressure was within the normal   range. PA peak pressure: 31 mm Hg (S). - Inferior vena cava: The vessel was normal in size. - Pericardium, extracardiac: There was no pericardial effusion.    ASSESSMENT & PLAN:    1. Afib with RVR - She is in A. fib for the past week. No improvement on increased dose of Cardizem CD to 360 MG. She is minimally symptomatic. Discussed with DOD Dr. Radford Pax. We will stop losartan/HCTZ. Start metoprolol 50mg  BID. We will not up titrate amiodarone given recent abnormal TSH. Check TSH and CBC today. No bleeding tissue. Continue  Eliquis for anticoagulation. Follow-up in A. fib clinic in few days. She wants to try medication prior to DCCV, discussed  in detailed.    2. HTN - Stable. Meds changes as above.   3. HLD - Continue statin.   Medication Adjustments/Labs and Tests Ordered: Current medicines are reviewed at length with the patient today.  Concerns regarding medicines are outlined above.  Medication changes, Labs and Tests ordered today are listed in the Patient Instructions below. Patient Instructions  Your physician has recommended you make the following change in your medication:  STOP  LOSARTAN   START  METOPROLOL 50 MG  TWICE DAILY Your physician recommends that you return for lab work in:  TODAY  TSH  AND  CBC  Your physician recommends that you schedule a follow-up appointment in:  Friday  IF POSSIBLE  IN  A FIB  CLINIC   OR Monday  IF NOTHING  AVAILABLE   Friday      Jarrett Soho, [place]  12/27/2016 11:21 AM    [facility] [place], [place], [place]  [address] Phone: [phone]; Fax: [phone]

## 2016-12-27 ENCOUNTER — Encounter: Payer: Self-pay | Admitting: Physician Assistant

## 2016-12-27 ENCOUNTER — Ambulatory Visit (INDEPENDENT_AMBULATORY_CARE_PROVIDER_SITE_OTHER): Payer: Medicare Other | Admitting: Physician Assistant

## 2016-12-27 VITALS — BP 130/70 | HR 122 | Ht 66.0 in | Wt 195.8 lb

## 2016-12-27 DIAGNOSIS — Z7901 Long term (current) use of anticoagulants: Secondary | ICD-10-CM

## 2016-12-27 DIAGNOSIS — I4891 Unspecified atrial fibrillation: Secondary | ICD-10-CM

## 2016-12-27 DIAGNOSIS — I10 Essential (primary) hypertension: Secondary | ICD-10-CM

## 2016-12-27 DIAGNOSIS — R002 Palpitations: Secondary | ICD-10-CM | POA: Diagnosis not present

## 2016-12-27 LAB — CBC WITH DIFFERENTIAL/PLATELET
BASOS ABS: 0.1 10*3/uL (ref 0.0–0.2)
Basos: 1 %
EOS (ABSOLUTE): 0.3 10*3/uL (ref 0.0–0.4)
Eos: 4 %
Hematocrit: 33.8 % — ABNORMAL LOW (ref 34.0–46.6)
Hemoglobin: 11.4 g/dL (ref 11.1–15.9)
Immature Grans (Abs): 0 10*3/uL (ref 0.0–0.1)
Immature Granulocytes: 0 %
LYMPHS ABS: 1.1 10*3/uL (ref 0.7–3.1)
Lymphs: 19 %
MCH: 27.3 pg (ref 26.6–33.0)
MCHC: 33.7 g/dL (ref 31.5–35.7)
MCV: 81 fL (ref 79–97)
Monocytes Absolute: 0.6 10*3/uL (ref 0.1–0.9)
Monocytes: 10 %
NEUTROS ABS: 3.9 10*3/uL (ref 1.4–7.0)
Neutrophils: 66 %
PLATELETS: 218 10*3/uL (ref 150–379)
RBC: 4.17 x10E6/uL (ref 3.77–5.28)
RDW: 20.7 % — AB (ref 12.3–15.4)
WBC: 5.9 10*3/uL (ref 3.4–10.8)

## 2016-12-27 LAB — TSH: TSH: 4.71 u[IU]/mL — AB (ref 0.450–4.500)

## 2016-12-27 MED ORDER — METOPROLOL TARTRATE 50 MG PO TABS: 50.0000 mg | ORAL_TABLET | Freq: Two times a day (BID) | ORAL | 3 refills | Status: DC

## 2016-12-27 NOTE — Patient Instructions (Addendum)
Your physician has recommended you make the following change in your medication:  STOP  LOSARTAN   START  METOPROLOL 50 MG  TWICE DAILY Your physician recommends that you return for lab work in:  TODAY  TSH  AND  CBC  Your physician recommends that you schedule a follow-up appointment in:  Friday  IF POSSIBLE  IN  A FIB  CLINIC   OR Monday  IF NOTHING  AVAILABLE   Friday

## 2016-12-28 ENCOUNTER — Telehealth: Payer: Self-pay | Admitting: Cardiovascular Disease

## 2016-12-28 NOTE — Telephone Encounter (Signed)
Returned pts call and left another message for her to call back.  

## 2016-12-28 NOTE — Telephone Encounter (Signed)
Follow Up    Pt is calling back again about her lab results

## 2016-12-28 NOTE — Telephone Encounter (Signed)
Returned pts call and we discussed her lab results. Pt verbalized understanding and appreciation.

## 2016-12-28 NOTE — Telephone Encounter (Signed)
Follow up     Pt is returning Jennifers call about her test results

## 2016-12-28 NOTE — Telephone Encounter (Signed)
New Message    Missed your call about her lab results from 12/29/16 , please call

## 2016-12-29 ENCOUNTER — Ambulatory Visit (HOSPITAL_COMMUNITY)
Admission: RE | Admit: 2016-12-29 | Discharge: 2016-12-29 | Disposition: A | Discharge status: Home / Self Care | Payer: Medicare Other | Source: Ambulatory Visit | Attending: Nurse Practitioner | Admitting: Nurse Practitioner

## 2016-12-29 ENCOUNTER — Encounter (HOSPITAL_COMMUNITY): Payer: Self-pay | Admitting: Nurse Practitioner

## 2016-12-29 VITALS — BP 134/80 | HR 84 | Ht 66.0 in | Wt 199.2 lb

## 2016-12-29 DIAGNOSIS — Z7984 Long term (current) use of oral hypoglycemic drugs: Secondary | ICD-10-CM | POA: Insufficient documentation

## 2016-12-29 DIAGNOSIS — Z8249 Family history of ischemic heart disease and other diseases of the circulatory system: Secondary | ICD-10-CM | POA: Insufficient documentation

## 2016-12-29 DIAGNOSIS — M1990 Unspecified osteoarthritis, unspecified site: Secondary | ICD-10-CM | POA: Insufficient documentation

## 2016-12-29 DIAGNOSIS — Z880 Allergy status to penicillin: Secondary | ICD-10-CM | POA: Diagnosis not present

## 2016-12-29 DIAGNOSIS — I4891 Unspecified atrial fibrillation: Secondary | ICD-10-CM

## 2016-12-29 DIAGNOSIS — R053 Chronic cough: Secondary | ICD-10-CM

## 2016-12-29 DIAGNOSIS — R05 Cough: Secondary | ICD-10-CM | POA: Diagnosis not present

## 2016-12-29 DIAGNOSIS — I481 Persistent atrial fibrillation: Secondary | ICD-10-CM | POA: Diagnosis present

## 2016-12-29 DIAGNOSIS — Z85850 Personal history of malignant neoplasm of thyroid: Secondary | ICD-10-CM | POA: Insufficient documentation

## 2016-12-29 DIAGNOSIS — Z9842 Cataract extraction status, left eye: Secondary | ICD-10-CM | POA: Diagnosis not present

## 2016-12-29 DIAGNOSIS — Z9841 Cataract extraction status, right eye: Secondary | ICD-10-CM | POA: Insufficient documentation

## 2016-12-29 DIAGNOSIS — Z9889 Other specified postprocedural states: Secondary | ICD-10-CM | POA: Insufficient documentation

## 2016-12-29 DIAGNOSIS — Z7902 Long term (current) use of antithrombotics/antiplatelets: Secondary | ICD-10-CM | POA: Insufficient documentation

## 2016-12-29 DIAGNOSIS — K219 Gastro-esophageal reflux disease without esophagitis: Secondary | ICD-10-CM | POA: Insufficient documentation

## 2016-12-29 DIAGNOSIS — I480 Paroxysmal atrial fibrillation: Secondary | ICD-10-CM

## 2016-12-29 DIAGNOSIS — H409 Unspecified glaucoma: Secondary | ICD-10-CM | POA: Insufficient documentation

## 2016-12-29 DIAGNOSIS — Z8042 Family history of malignant neoplasm of prostate: Secondary | ICD-10-CM | POA: Insufficient documentation

## 2016-12-29 DIAGNOSIS — Z853 Personal history of malignant neoplasm of breast: Secondary | ICD-10-CM | POA: Diagnosis not present

## 2016-12-29 DIAGNOSIS — G43909 Migraine, unspecified, not intractable, without status migrainosus: Secondary | ICD-10-CM | POA: Insufficient documentation

## 2016-12-29 DIAGNOSIS — I10 Essential (primary) hypertension: Secondary | ICD-10-CM | POA: Diagnosis not present

## 2016-12-29 DIAGNOSIS — Z90711 Acquired absence of uterus with remaining cervical stump: Secondary | ICD-10-CM | POA: Insufficient documentation

## 2016-12-29 DIAGNOSIS — E119 Type 2 diabetes mellitus without complications: Secondary | ICD-10-CM | POA: Insufficient documentation

## 2016-12-29 DIAGNOSIS — E039 Hypothyroidism, unspecified: Secondary | ICD-10-CM | POA: Insufficient documentation

## 2016-12-29 DIAGNOSIS — G4733 Obstructive sleep apnea (adult) (pediatric): Secondary | ICD-10-CM | POA: Diagnosis not present

## 2016-12-29 DIAGNOSIS — R011 Cardiac murmur, unspecified: Secondary | ICD-10-CM | POA: Diagnosis not present

## 2016-12-29 DIAGNOSIS — Z8541 Personal history of malignant neoplasm of cervix uteri: Secondary | ICD-10-CM | POA: Insufficient documentation

## 2016-12-29 DIAGNOSIS — Z833 Family history of diabetes mellitus: Secondary | ICD-10-CM | POA: Diagnosis not present

## 2016-12-29 DIAGNOSIS — I4819 Other persistent atrial fibrillation: Secondary | ICD-10-CM

## 2016-12-29 DIAGNOSIS — Z7901 Long term (current) use of anticoagulants: Secondary | ICD-10-CM | POA: Diagnosis not present

## 2016-12-29 DIAGNOSIS — Z800 Family history of malignant neoplasm of digestive organs: Secondary | ICD-10-CM | POA: Insufficient documentation

## 2016-12-29 NOTE — Progress Notes (Signed)
Primary Care Physician: Irven Shelling, MD Cardiologist: [name] Referral provider: Curly [place], B.,PA   ANALYSSA [name] is a 83 y.o. female with a h/o afib that has been on amiodarone since June of 2015. She has had recent afib in the setting of persistent cough x 6-8 weeks  that has not responded to two rounds of prednisone and antibiotics. She has also been using cough syrup with decongestants. Her Cardizem was increased and as of 2/14, BB was added with good slowing of v rate and with slower heart rate, she does feel better but persists in afib. She is not really wanting a cardioversion. Her biggest concern is this cough. She did have a CXR and other than a large hiatal hernia, there were no other abnormalities noted. TSH has also been mildly elevated but PCP aware and is monitoring. Therefore, increasing amiodarone has not been attempted.Continues on eliquis.  Today, she denies symptoms of chest pain, shortness of breath, orthopnea, PND, lower extremity edema, dizziness, presyncope, syncope, or neurologic sequela.Perisistent cough. The patient is tolerating medications without difficulties and is otherwise without complaint today.   Past Medical History:  Diagnosis Date  . Allergy    SEASONAL  . Anemia    years ago  . Arthritis    "all my joints" (03/31/2014)  . Atrial fibrillation ([place])   . Breast cancer [facility])    s/p right breast lumpectomy  . Cervical cancer ([place])   . DJD (degenerative joint disease) of knee   . Dysrhythmia    Afib  . GERD (gastroesophageal reflux disease)   . Glaucoma   . H/O hiatal hernia   . Heart murmur   . Hx of transesophageal echocardiography (TEE) for monitoring    TEE (03/2014): No LAA clot, moderate LAE, core triatriatum type structure in RA with no stenosis, normal EF 55-60%, mild MR  . Hypertension   . Hypothyroidism, postsurgical   . Migraine    "last one was years ago" (03/31/2014)  . OSA on CPAP   . PONV (postoperative nausea and  vomiting)   . Sleep apnea   . Thyroid cancer [facility])    s/p thyroidectomy  . Type II diabetes mellitus ([place])    Past Surgical History:  Procedure Laterality Date  . ABDOMINAL HYSTERECTOMY     "partial"  . BREAST BIOPSY Right   . BREAST LUMPECTOMY Right   . CARDIOVERSION N/A 03/30/2014   Procedure: CARDIOVERSION - BEDSIDE;  Surgeon: [name], MD;  Location: [place];  Service: Cardiovascular;  Laterality: N/A;  . CARDIOVERSION N/A 03/31/2014   Procedure: CARDIOVERSION  (BEDSIDE) ;  Surgeon: Lelon Perla, MD;  Location: [place];  Service: Cardiovascular;  Laterality: N/A;  . CARDIOVERSION N/A 04/27/2014   Procedure: CARDIOVERSION;  Surgeon: Darlin Coco, MD;  Location: [facility] ENDOSCOPY;  Service: Cardiovascular;  Laterality: N/A;  . CARDIOVERSION N/A 04/13/2015   Procedure: CARDIOVERSION;  Surgeon: Thayer Headings, MD;  Location: [place];  Service: Cardiovascular;  Laterality: N/A;  . CARPAL TUNNEL RELEASE Right   . CATARACT EXTRACTION W/ INTRAOCULAR LENS  IMPLANT, BILATERAL Bilateral   . COLONOSCOPY    . EXCISIONAL HEMORRHOIDECTOMY    . POLYPECTOMY    . TEE WITHOUT CARDIOVERSION N/A 03/30/2014   Procedure: TRANSESOPHAGEAL ECHOCARDIOGRAM (TEE);  Surgeon: [name], MD;  Location: [facility] ENDOSCOPY;  Service: Cardiovascular;  Laterality: N/A;  . THYROIDECTOMY    . TONSILLECTOMY      Current Outpatient Prescriptions  Medication Sig Dispense Refill  . acetaminophen (TYLENOL)  650 MG CR tablet Take 1,300 mg by mouth every 8 (eight) hours.    Marland Kitchen amiodarone (PACERONE) 200 MG tablet TAKE 1 TABLET BY MOUTH  DAILY 90 tablet 0  . bimatoprost (LUMIGAN) 0.01 % SOLN Place 1 drop into both eyes at bedtime. Eye drop    . calcium citrate-vitamin D (CITRACAL+D) 315-200 MG-UNIT tablet Take 1 tablet by mouth 2 (two) times daily.     . Cyanocobalamin (B-12 PO) Take 1 tablet by mouth daily as needed (for energy).     Marland Kitchen diltiazem (CARDIZEM CD) 360 MG 24 hr capsule Take 360 mg by mouth daily.    Marland Kitchen  ELIQUIS 5 MG TABS tablet TAKE 1 TABLET BY MOUTH TWO  TIMES DAILY 180 tablet 3  . FeFum-FePo-FA-B Cmp-C-Zn-Mn-Cu (TANDEM PLUS) 162-115.2-1 MG CAPS Take 1 tablet by mouth daily as needed (low iron).     . fluticasone (FLONASE) 50 MCG/ACT nasal spray Place 1 spray into both nostrils daily. Reported on 02/25/2016    . lansoprazole (PREVACID) 30 MG capsule Take 30 mg by mouth every morning.    Marland Kitchen levothyroxine (SYNTHROID, LEVOTHROID) 150 MCG tablet Take 150 mcg by mouth daily before breakfast.    . LORazepam (ATIVAN) 1 MG tablet Take 1.5 mg by mouth at bedtime.    . Lutein 6 MG CAPS Take 6 mg by mouth 2 (two) times daily as needed (for vegetable support).    . Magnesium 250 MG TABS Take 2 tablets by mouth.    . metFORMIN (GLUMETZA) 500 MG (MOD) 24 hr tablet Take 500 mg by mouth 2 (two) times daily with a meal.     . metoprolol (LOPRESSOR) 50 MG tablet Take 1 tablet (50 mg total) by mouth 2 (two) times daily. 180 tablet 3  . multivitamin-lutein (OCUVITE-LUTEIN) CAPS capsule Take 1 capsule by mouth daily.    . ONE TOUCH ULTRA TEST test strip Use as directed    . ONETOUCH DELICA LANCETS 99991111 MISC Use as directed    . potassium chloride SA (K-DUR,KLOR-CON) 20 MEQ tablet Take 3 tablets (60 mEq total) by mouth daily. 270 tablet 3  . rosuvastatin (CRESTOR) 20 MG tablet Take 10 mg by mouth at bedtime.    . sertraline (ZOLOFT) 50 MG tablet Take 50 mg by mouth daily.     No current facility-administered medications for this encounter.     Allergies  Allergen Reactions  . Penicillins Itching and Rash    Has patient had a PCN reaction causing immediate rash, facial/tongue/throat swelling, SOB or lightheadedness with hypotension: Unknown, childhood reaction Has patient had a PCN reaction causing severe rash involving mucus membranes or skin necrosis: No Has patient had a PCN reaction that required hospitalization No Has patient had a PCN reaction occurring within the last 10 years: No If all of the above  answers are "NO", then may proceed with Cephalosporin use.     Social History   Social History  . Marital status: Divorced    Spouse name: N/A  . Number of children: 2  . Years of education: N/A   Occupational History  . Retired    Social History Main Topics  . Smoking status: Never Smoker  . Smokeless tobacco: Never Used  . Alcohol use No  . Drug use: No  . Sexual activity: No   Other Topics Concern  . Not on file   Social History Narrative  . No narrative on file    Family History  Problem Relation Age of Onset  .  Prostate cancer Father   . Diabetes Sister   . Heart disease Sister   . Heart attack Brother   . Heart disease Brother   . Diabetes Brother   . Diabetes Sister   . Diabetes Sister   . Pancreatic cancer Sister   . Diabetes Brother   . Heart disease Brother   . Diabetes Brother   . Heart disease Brother   . Diabetes Brother   . Heart disease Brother   . Diabetes Brother   . Heart disease Brother   . Diabetes Brother   . Heart disease Brother   . Diabetes Brother     ROS- All systems are reviewed and negative except as per the HPI above  Physical Exam: Vitals:   12/29/16 0948  BP: 134/80  Pulse: 84  Weight: 199 lb 3.2 oz (90.4 kg)  Height: 5\' 6"  (1.676 m)   Wt Readings from Last 3 Encounters:  12/29/16 199 lb 3.2 oz (90.4 kg)  12/27/16 195 lb 12.8 oz (88.8 kg)  11/07/16 200 lb 1.9 oz (90.8 kg)    Labs: Lab Results  Component Value Date   NA 138 11/01/2016   K 3.8 11/01/2016   CL 102 11/01/2016   CO2 26 11/01/2016   GLUCOSE 122 (H) 11/01/2016   BUN 18 11/01/2016   CREATININE 0.98 (H) 11/01/2016   CALCIUM 9.0 11/01/2016   MG 1.8 03/28/2014   Lab Results  Component Value Date   INR 1.12 04/20/2016   No results found for: CHOL, HDL, LDLCALC, TRIG   GEN- The patient is well appearing, alert and oriented x 3 today.   Head- normocephalic, atraumatic Eyes-  Sclera clear, conjunctiva pink Ears- hearing intact Oropharynx-  clear Neck- supple, no JVP Lymph- no cervical lymphadenopathy Lungs- Clear to ausculation bilaterally, normal work of breathing Heart- irregular rate and rhythm, no murmurs, rubs or gallops, PMI not laterally displaced GI- soft, NT, ND, + BS Extremities- no clubbing, cyanosis, or edema MS- no significant deformity or atrophy Skin- no rash or lesion Psych- euthymic mood, full affect Neuro- strength and sensation are intact  EKG- afib at 84 bpm, qrs int 110 ms, qtc 493 ms Epic records reviewed Cxr-IMPRESSION: There is a large hiatal hernia with worsening gaseous distention. The hernia measures at least 9.8 x 9.4 cm. Moderate gas noted within esophagus. No infiltrate or pulmonary edema. Surgical clips are noted in right axilla.    Assessment and Plan: 1. Persistent afib Remains in afib with recent addition of BB, but with slower v rates and pt feels improved She would like to wait a few more days to see if drug may convert her to [place]  Not too eager to have repeat cardioversion Continue amiodarone at 200 mg a day Continue Cardizem at 360 mg a day Continue eliquis 5 mg bid  2. Persistent cough Has had 2 rounds of prednisone and antibiotics, cough syrup with decongestants without resolution I am concerned re use of amiodarone and possible lung toxicity  Will refer to pulmonology to see if amiodarone is etiology of cough/can be continued Try to avoid decongestants as may make AF harder to treat  Will see back in one week  Butch Penny C. Azara Gemme, [facility] [address] [place], [place] [address] [phone]

## 2017-01-02 ENCOUNTER — Ambulatory Visit (INDEPENDENT_AMBULATORY_CARE_PROVIDER_SITE_OTHER): Payer: Medicare Other | Admitting: Pulmonary Disease

## 2017-01-02 ENCOUNTER — Encounter: Payer: Self-pay | Admitting: Pulmonary Disease

## 2017-01-02 VITALS — BP 118/78 | HR 86 | Ht 66.0 in | Wt 197.0 lb

## 2017-01-02 DIAGNOSIS — R05 Cough: Secondary | ICD-10-CM | POA: Diagnosis not present

## 2017-01-02 DIAGNOSIS — R053 Chronic cough: Secondary | ICD-10-CM

## 2017-01-02 DIAGNOSIS — T462X1A Poisoning by other antidysrhythmic drugs, accidental (unintentional), initial encounter: Secondary | ICD-10-CM | POA: Diagnosis not present

## 2017-01-02 DIAGNOSIS — K449 Diaphragmatic hernia without obstruction or gangrene: Secondary | ICD-10-CM | POA: Diagnosis not present

## 2017-01-02 HISTORY — DX: Chronic cough: R05.3

## 2017-01-02 MED ORDER — LANSOPRAZOLE 30 MG PO CPDR: 30.0000 mg | DELAYED_RELEASE_CAPSULE | Freq: Two times a day (BID) | ORAL | 2 refills | Status: DC

## 2017-01-02 NOTE — Assessment & Plan Note (Signed)
Would suggest obtaining baseline spirometry plus DLCO on amiodarone

## 2017-01-02 NOTE — Assessment & Plan Note (Signed)
We will obtain UGI series to evaluate hernia -the symptoms have worsened compared to CT scan from 2010 Take Prevacid twice daily 30 minutes before lunch and dinner  Unfortunately I doubt that she is a candidate for surgical correction given her multiple medical issues and  age

## 2017-01-02 NOTE — Progress Notes (Signed)
Subjective:    Patient ID: [name], female    DOB: 1930/11/21, 83 y.o.   MRN: [id number]  HPI  Chief Complaint  Patient presents with  . Pulm Consult    Was referred by the AFib clinic due to having a deep cough that has not responded to OTC meds nor prednisone. Non productive cough.     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 She reports insidious onset of dry cough about 2 months ago without preceding URI symptoms. Cough is worse with lying down and does keep her awake at night. She received 2 courses of prednisone and an antibiotic course from her PCP with limited relief. She is worried that she has COPD or emphysema. She denies fevers, shortness of breath or wheezing.  She has chronic atrial fibrillation and is maintained on amiodarone for at least 2 years. She has known moderate hiatal hernia, demonstrated on her chest x-ray 09/2016 which shows at least 10 cm herniated stomach into the chest. This appears to be worse when compared to her CT scan from 04/2009. She reports occasional breakthrough symptoms of reflux on Prevacid.  She denies postnasal drip  Past Medical History:  Diagnosis Date  . Allergy    SEASONAL  . Anemia    years ago  . Arthritis    "all my joints" (03/31/2014)  . Atrial fibrillation ([place])   . Breast cancer [facility])    s/p right breast lumpectomy  . Cervical cancer ([place])   . DJD (degenerative joint disease) of knee   . Dysrhythmia    Afib  . GERD (gastroesophageal reflux disease)   . Glaucoma   . H/O hiatal hernia   . Heart murmur   . Hx of transesophageal echocardiography (TEE) for monitoring    TEE (03/2014): No LAA clot, moderate LAE, core triatriatum type structure in RA with no stenosis, normal EF 55-60%, mild MR  . Hypertension   . Hypothyroidism, postsurgical   . Migraine    "last one was years ago" (03/31/2014)  . OSA on CPAP   . PONV (postoperative nausea and vomiting)   .  Sleep apnea   . Thyroid cancer [facility])    s/p thyroidectomy  . Type II diabetes mellitus ([place])    Past Surgical History:  Procedure Laterality Date  . ABDOMINAL HYSTERECTOMY     "partial"  . BREAST BIOPSY Right   . BREAST LUMPECTOMY Right   . CARDIOVERSION N/A 03/30/2014   Procedure: CARDIOVERSION - BEDSIDE;  Surgeon: [name], MD;  Location: [place];  Service: Cardiovascular;  Laterality: N/A;  . CARDIOVERSION N/A 03/31/2014   Procedure: CARDIOVERSION  (BEDSIDE) ;  Surgeon: Lelon Perla, MD;  Location: [place];  Service: Cardiovascular;  Laterality: N/A;  . CARDIOVERSION N/A 04/27/2014   Procedure: CARDIOVERSION;  Surgeon: Darlin Coco, MD;  Location: [facility] ENDOSCOPY;  Service: Cardiovascular;  Laterality: N/A;  . CARDIOVERSION N/A 04/13/2015   Procedure: CARDIOVERSION;  Surgeon: Thayer Headings, MD;  Location: [place];  Service: Cardiovascular;  Laterality: N/A;  . CARPAL TUNNEL RELEASE Right   . CATARACT EXTRACTION W/ INTRAOCULAR LENS  IMPLANT, BILATERAL Bilateral   . COLONOSCOPY    . EXCISIONAL HEMORRHOIDECTOMY    . POLYPECTOMY    . TEE WITHOUT CARDIOVERSION N/A 03/30/2014   Procedure: TRANSESOPHAGEAL ECHOCARDIOGRAM (TEE);  Surgeon: [name], MD;  Location: [facility] ENDOSCOPY;  Service: Cardiovascular;  Laterality: N/A;  . THYROIDECTOMY    . TONSILLECTOMY  Allergies  Allergen Reactions  . Penicillins Itching and Rash    Has patient had a PCN reaction causing immediate rash, facial/tongue/throat swelling, SOB or lightheadedness with hypotension: Unknown, childhood reaction Has patient had a PCN reaction causing severe rash involving mucus membranes or skin necrosis: No Has patient had a PCN reaction that required hospitalization No Has patient had a PCN reaction occurring within the last 10 years: No If all of the above answers are "NO", then may proceed with Cephalosporin use.      Social History   Social History  . Marital status: Divorced    Spouse name:  N/A  . Number of children: 2  . Years of education: N/A   Occupational History  . Retired    Social History Main Topics  . Smoking status: Never Smoker  . Smokeless tobacco: Never Used  . Alcohol use No  . Drug use: No  . Sexual activity: No   Other Topics Concern  . Not on file   Social History Narrative  . No narrative on file   Family History  Problem Relation Age of Onset  . Prostate cancer Father   . Diabetes Sister   . Heart disease Sister   . Heart attack Brother   . Heart disease Brother   . Diabetes Brother   . Diabetes Sister   . Diabetes Sister   . Pancreatic cancer Sister   . Diabetes Brother   . Heart disease Brother   . Diabetes Brother   . Heart disease Brother   . Diabetes Brother   . Heart disease Brother   . Diabetes Brother   . Heart disease Brother   . Diabetes Brother   . Heart disease Brother   . Diabetes Brother      Review of Systems  Constitutional: Negative for fever and unexpected weight change.  HENT: Positive for ear pain. Negative for congestion, dental problem, nosebleeds, postnasal drip, rhinorrhea, sinus pressure, sneezing, sore throat and trouble swallowing.   Eyes: Negative for redness and itching.  Respiratory: Positive for cough and shortness of breath. Negative for chest tightness and wheezing.   Cardiovascular: Negative for palpitations and leg swelling.  Gastrointestinal: Negative for nausea and vomiting.  Genitourinary: Negative for dysuria.  Musculoskeletal: Positive for joint swelling.  Skin: Negative for rash.  Neurological: Negative for headaches.  Hematological: Does not bruise/bleed easily.  Psychiatric/Behavioral: Negative for dysphoric mood. The patient is not nervous/anxious.        Objective:   Physical Exam  Gen. Pleasant, well-nourished, in no distress, normal affect ENT - no lesions, no post nasal drip Neck: No JVD, no thyromegaly, no carotid bruits Lungs: no use of accessory muscles, no dullness  to percussion, clear without rales or rhonchi  Cardiovascular: Rhythm regular, heart sounds  normal, no murmurs or gallops, no peripheral edema Abdomen: soft and non-tender, no hepatosplenomegaly, BS normal. Musculoskeletal: No deformities, no cyanosis or clubbing Neuro:  alert, non focal       Assessment & Plan:

## 2017-01-02 NOTE — Addendum Note (Signed)
Addended by: Valerie Salts on: 01/02/2017 11:25 AM   Modules accepted: Orders

## 2017-01-02 NOTE — Assessment & Plan Note (Signed)
cough is likely  related to reflux caused by a large hiatal hernia I reassured her that she does NOT have COPD or emphysema. Doubt that amiodarone is causing her problems and ca be continued  Take Delsym 5 ML twice daily for 2 weeks until cough better

## 2017-01-02 NOTE — Patient Instructions (Signed)
Your cough is likely  related to reflux caused by a large hiatal hernia You do NOT have COPD or emphysema  We will obtain UGI series to evaluate hernia Take Prevacid twice daily 30 minutes before lunch and dinner  Take Delsym 5 ML twice daily for 2 weeks until cough better

## 2017-01-04 ENCOUNTER — Inpatient Hospital Stay (HOSPITAL_COMMUNITY): Admission: RE | Admit: 2017-01-04 | Payer: Medicare Other | Source: Ambulatory Visit | Admitting: Nurse Practitioner

## 2017-01-05 ENCOUNTER — Encounter (HOSPITAL_COMMUNITY): Payer: Self-pay | Admitting: Nurse Practitioner

## 2017-01-05 ENCOUNTER — Ambulatory Visit (HOSPITAL_COMMUNITY)
Admission: RE | Admit: 2017-01-05 | Discharge: 2017-01-05 | Disposition: A | Discharge status: Home / Self Care | Payer: Medicare Other | Source: Ambulatory Visit | Attending: Nurse Practitioner | Admitting: Nurse Practitioner

## 2017-01-05 VITALS — BP 146/90 | HR 107 | Ht 66.0 in | Wt 198.0 lb

## 2017-01-05 DIAGNOSIS — Z833 Family history of diabetes mellitus: Secondary | ICD-10-CM | POA: Diagnosis not present

## 2017-01-05 DIAGNOSIS — R05 Cough: Secondary | ICD-10-CM | POA: Insufficient documentation

## 2017-01-05 DIAGNOSIS — I4819 Other persistent atrial fibrillation: Secondary | ICD-10-CM

## 2017-01-05 DIAGNOSIS — Z880 Allergy status to penicillin: Secondary | ICD-10-CM | POA: Diagnosis not present

## 2017-01-05 DIAGNOSIS — Z8249 Family history of ischemic heart disease and other diseases of the circulatory system: Secondary | ICD-10-CM | POA: Diagnosis not present

## 2017-01-05 DIAGNOSIS — Z8042 Family history of malignant neoplasm of prostate: Secondary | ICD-10-CM | POA: Diagnosis not present

## 2017-01-05 DIAGNOSIS — Z79899 Other long term (current) drug therapy: Secondary | ICD-10-CM | POA: Diagnosis not present

## 2017-01-05 DIAGNOSIS — I481 Persistent atrial fibrillation: Secondary | ICD-10-CM | POA: Diagnosis present

## 2017-01-05 DIAGNOSIS — Z7984 Long term (current) use of oral hypoglycemic drugs: Secondary | ICD-10-CM | POA: Insufficient documentation

## 2017-01-05 DIAGNOSIS — Z7901 Long term (current) use of anticoagulants: Secondary | ICD-10-CM | POA: Diagnosis not present

## 2017-01-05 DIAGNOSIS — Z800 Family history of malignant neoplasm of digestive organs: Secondary | ICD-10-CM | POA: Insufficient documentation

## 2017-01-05 LAB — BASIC METABOLIC PANEL
ANION GAP: 7 (ref 5–15)
BUN: 14 mg/dL (ref 6–20)
CHLORIDE: 107 mmol/L (ref 101–111)
CO2: 26 mmol/L (ref 22–32)
Calcium: 9.5 mg/dL (ref 8.9–10.3)
Creatinine, Ser: 0.89 mg/dL (ref 0.44–1.00)
GFR calc Af Amer: >60 mL/min (ref >60)
GFR calc non Af Amer: 58 mL/min — ABNORMAL LOW (ref >60)
Glucose, Bld: 133 mg/dL — ABNORMAL HIGH (ref 65–99)
POTASSIUM: 4.5 mmol/L (ref 3.5–5.1)
Sodium: 140 mmol/L (ref 135–145)

## 2017-01-05 LAB — CBC
HEMATOCRIT: 33.6 % — AB (ref 36.0–46.0)
HEMOGLOBIN: 10.7 g/dL — AB (ref 12.0–15.0)
MCH: 26.5 pg (ref 26.0–34.0)
MCHC: 31.8 g/dL (ref 30.0–36.0)
MCV: 83.2 fL (ref 78.0–100.0)
Platelets: 205 10*3/uL (ref 150–400)
RBC: 4.04 MIL/uL (ref 3.87–5.11)
RDW: 19.9 % — AB (ref 11.5–15.5)
WBC: 6.6 10*3/uL (ref 4.0–10.5)

## 2017-01-05 NOTE — Progress Notes (Signed)
Primary Care Physician: Irven Shelling, MD Cardiologist: [name] Referral provider: Curly Shores, B.,PA   [name] is a 83 y.o. female with a h/o afib that has been on amiodarone since June of 2015. She has had recent afib in the setting of persistent cough x 6-8 weeks  that has not responded to two rounds of prednisone and antibiotics. She has also been using cough syrup with decongestants. Her Cardizem was increased and as of 2/14, BB was added with good slowing of v rate and with slower heart rate, she does feel better but persists in afib. She is not really wanting a cardioversion. Her biggest concern is this cough. She did have a CXR and other than a large hiatal hernia, there were no other abnormalities noted. TSH has also been mildly elevated but PCP aware and is monitoring. Therefore, increasing amiodarone has not been attempted.Continues on eliquis.  Returns to afib clinic 2/23. She has been to see pulmonology and the etiology of the cough was felt not to be amiodarone but a large hiatal hernia. She is pending 2 more pulmonology tests. She is now ready to be cardioverted and will schedule this next week. She has not missed any dose of DOAC.  Today, she denies symptoms of chest pain, shortness of breath, orthopnea, PND, lower extremity edema, dizziness, presyncope, syncope, or neurologic sequela.Perisistent cough. The patient is tolerating medications without difficulties and is otherwise without complaint today.   Past Medical History:  Diagnosis Date  . Allergy    SEASONAL  . Anemia    years ago  . Arthritis    "all my joints" (03/31/2014)  . Atrial fibrillation ([place])   . Breast cancer [facility])    s/p right breast lumpectomy  . Cervical cancer ([place])   . DJD (degenerative joint disease) of knee   . Dysrhythmia    Afib  . GERD (gastroesophageal reflux disease)   . Glaucoma   . H/O hiatal hernia   . Heart murmur   . Hx of transesophageal echocardiography (TEE) for  monitoring    TEE (03/2014): No LAA clot, moderate LAE, core triatriatum type structure in RA with no stenosis, normal EF 55-60%, mild MR  . Hypertension   . Hypothyroidism, postsurgical   . Migraine    "last one was years ago" (03/31/2014)  . OSA on CPAP   . PONV (postoperative nausea and vomiting)   . Sleep apnea   . Thyroid cancer [facility])    s/p thyroidectomy  . Type II diabetes mellitus ([place])    Past Surgical History:  Procedure Laterality Date  . ABDOMINAL HYSTERECTOMY     "partial"  . BREAST BIOPSY Right   . BREAST LUMPECTOMY Right   . CARDIOVERSION N/A 03/30/2014   Procedure: CARDIOVERSION - BEDSIDE;  Surgeon: [name], MD;  Location: [place];  Service: Cardiovascular;  Laterality: N/A;  . CARDIOVERSION N/A 03/31/2014   Procedure: CARDIOVERSION  (BEDSIDE) ;  Surgeon: Lelon Perla, MD;  Location: [place];  Service: Cardiovascular;  Laterality: N/A;  . CARDIOVERSION N/A 04/27/2014   Procedure: CARDIOVERSION;  Surgeon: Darlin Coco, MD;  Location: [facility] ENDOSCOPY;  Service: Cardiovascular;  Laterality: N/A;  . CARDIOVERSION N/A 04/13/2015   Procedure: CARDIOVERSION;  Surgeon: Thayer Headings, MD;  Location: [place];  Service: Cardiovascular;  Laterality: N/A;  . CARPAL TUNNEL RELEASE Right   . CATARACT EXTRACTION W/ INTRAOCULAR LENS  IMPLANT, BILATERAL Bilateral   . COLONOSCOPY    . EXCISIONAL HEMORRHOIDECTOMY    .  POLYPECTOMY    . TEE WITHOUT CARDIOVERSION N/A 03/30/2014   Procedure: TRANSESOPHAGEAL ECHOCARDIOGRAM (TEE);  Surgeon: [name], MD;  Location: [facility] ENDOSCOPY;  Service: Cardiovascular;  Laterality: N/A;  . THYROIDECTOMY    . TONSILLECTOMY      Current Outpatient Prescriptions  Medication Sig Dispense Refill  . acetaminophen (TYLENOL) 650 MG CR tablet Take 1,300 mg by mouth every 8 (eight) hours.    Marland Kitchen amiodarone (PACERONE) 200 MG tablet TAKE 1 TABLET BY MOUTH  DAILY 90 tablet 0  . bimatoprost (LUMIGAN) 0.01 % SOLN Place 1 drop into both eyes at bedtime.  Eye drop    . calcium citrate-vitamin D (CITRACAL+D) 315-200 MG-UNIT tablet Take 1 tablet by mouth 2 (two) times daily.     Marland Kitchen diltiazem (CARDIZEM CD) 360 MG 24 hr capsule Take 360 mg by mouth daily.    Marland Kitchen ELIQUIS 5 MG TABS tablet TAKE 1 TABLET BY MOUTH TWO  TIMES DAILY 180 tablet 3  . FeFum-FePo-FA-B Cmp-C-Zn-Mn-Cu (TANDEM PLUS) 162-115.2-1 MG CAPS Take 1 tablet by mouth daily as needed (low iron).     . fluticasone (FLONASE) 50 MCG/ACT nasal spray Place 1 spray into both nostrils daily. Reported on 02/25/2016    . lansoprazole (PREVACID) 30 MG capsule Take 1 capsule (30 mg total) by mouth 2 (two) times daily before a meal. 180 capsule 2  . levothyroxine (SYNTHROID, LEVOTHROID) 150 MCG tablet Take 150 mcg by mouth daily before breakfast.    . LORazepam (ATIVAN) 1 MG tablet Take 1.5 mg by mouth at bedtime.    . Lutein 6 MG CAPS Take 6 mg by mouth 2 (two) times daily as needed (for vegetable support).    . Magnesium 250 MG TABS Take 2 tablets by mouth.    . metFORMIN (GLUMETZA) 500 MG (MOD) 24 hr tablet Take 500 mg by mouth 2 (two) times daily with a meal.     . metoprolol (LOPRESSOR) 50 MG tablet Take 1 tablet (50 mg total) by mouth 2 (two) times daily. 180 tablet 3  . multivitamin-lutein (OCUVITE-LUTEIN) CAPS capsule Take 1 capsule by mouth daily.    . ONE TOUCH ULTRA TEST test strip Use as directed    . ONETOUCH DELICA LANCETS 99991111 MISC Use as directed    . potassium chloride SA (K-DUR,KLOR-CON) 20 MEQ tablet Take 3 tablets (60 mEq total) by mouth daily. 270 tablet 3  . rosuvastatin (CRESTOR) 20 MG tablet Take 10 mg by mouth at bedtime.    . sertraline (ZOLOFT) 50 MG tablet Take 50 mg by mouth daily.    . Cyanocobalamin (B-12 PO) Take 1 tablet by mouth daily as needed (for energy).      No current facility-administered medications for this encounter.     Allergies  Allergen Reactions  . Penicillins Itching and Rash    Has patient had a PCN reaction causing immediate rash,  facial/tongue/throat swelling, SOB or lightheadedness with hypotension: Unknown, childhood reaction Has patient had a PCN reaction causing severe rash involving mucus membranes or skin necrosis: No Has patient had a PCN reaction that required hospitalization No Has patient had a PCN reaction occurring within the last 10 years: No If all of the above answers are "NO", then may proceed with Cephalosporin use.     Social History   Social History  . Marital status: Divorced    Spouse name: N/A  . Number of children: 2  . Years of education: N/A   Occupational History  . Retired  Social History Main Topics  . Smoking status: Never Smoker  . Smokeless tobacco: Never Used  . Alcohol use No  . Drug use: No  . Sexual activity: No   Other Topics Concern  . Not on file   Social History Narrative  . No narrative on file    Family History  Problem Relation Age of Onset  . Prostate cancer Father   . Diabetes Sister   . Heart disease Sister   . Heart attack Brother   . Heart disease Brother   . Diabetes Brother   . Diabetes Sister   . Diabetes Sister   . Pancreatic cancer Sister   . Diabetes Brother   . Heart disease Brother   . Diabetes Brother   . Heart disease Brother   . Diabetes Brother   . Heart disease Brother   . Diabetes Brother   . Heart disease Brother   . Diabetes Brother   . Heart disease Brother   . Diabetes Brother     ROS- All systems are reviewed and negative except as per the HPI above  Physical Exam: Vitals:   01/05/17 1036  BP: (!) 146/90  Pulse: (!) 107  Weight: 198 lb (89.8 kg)  Height: 5\' 6"  (1.676 m)   Wt Readings from Last 3 Encounters:  01/05/17 198 lb (89.8 kg)  01/02/17 197 lb (89.4 kg)  12/29/16 199 lb 3.2 oz (90.4 kg)    Labs: Lab Results  Component Value Date   NA 140 01/05/2017   K 4.5 01/05/2017   CL 107 01/05/2017   CO2 26 01/05/2017   GLUCOSE 133 (H) 01/05/2017   BUN 14 01/05/2017   CREATININE 0.89 01/05/2017    CALCIUM 9.5 01/05/2017   MG 1.8 03/28/2014   Lab Results  Component Value Date   INR 1.12 04/20/2016   No results found for: CHOL, HDL, LDLCALC, TRIG   GEN- The patient is well appearing, alert and oriented x 3 today.   Head- normocephalic, atraumatic Eyes-  Sclera clear, conjunctiva pink Ears- hearing intact Oropharynx- clear Neck- supple, no JVP Lymph- no cervical lymphadenopathy Lungs- Clear to ausculation bilaterally, normal work of breathing Heart- irregular rate and rhythm, no murmurs, rubs or gallops, PMI not laterally displaced GI- soft, NT, ND, + BS Extremities- no clubbing, cyanosis, or edema MS- no significant deformity or atrophy Skin- no rash or lesion Psych- euthymic mood, full affect Neuro- strength and sensation are intact  EKG- afib at 107 bpm, qrs int 92 ms, qtc 467 ms Epic records reviewed Cxr-IMPRESSION: There is a large hiatal hernia with worsening gaseous distention. The hernia measures at least 9.8 x 9.4 cm. Moderate gas noted within esophagus. No infiltrate or pulmonary edema. Surgical clips are noted in right axilla.    Assessment and Plan: 1. Persistent afib Remains in afib with recent addition of BB, but with slower v rates and pt feels improved but not as well as when in SR She is now ready for cardioversion, has required one a year for the last couple of years, but since it holds for a long  period of time, feel it is reasonable to do Continue amiodarone at 200 mg a day Continue Cardizem at 360 mg a day Continue eliquis 5 mg bid, states no missed doses x 3-4 weeks  2. Persistent cough Has had 2 rounds of prednisone and antibiotics, cough syrup with decongestants without resolution Dr. Elsworth Soho believes cough is 2/2 hiatal hernia and can continue amiodraone Try to avoid  decongestants as may make AF harder to treat  Will see back in one week after cardioversion  Butch Penny C. Ashaunte Standley, [facility] [address] [place], [place] [address] [phone]

## 2017-01-05 NOTE — Patient Instructions (Signed)
Cardioversion scheduled for Monday, February 26th  - Arrive at the [facility] and go to admitting at 9:30am  -Do not eat or drink anything after midnight the night prior to your procedure.  - Take all your medication with a sip of water prior to arrival.  - You will not be able to drive home after your procedure.

## 2017-01-08 ENCOUNTER — Ambulatory Visit (HOSPITAL_COMMUNITY): Payer: Medicare Other | Admitting: Certified Registered Nurse Anesthetist

## 2017-01-08 ENCOUNTER — Ambulatory Visit (HOSPITAL_COMMUNITY)
Admission: RE | Admit: 2017-01-08 | Discharge: 2017-01-08 | Disposition: A | Discharge status: Home / Self Care | Payer: Medicare Other | Source: Ambulatory Visit | Attending: Cardiovascular Disease | Admitting: Cardiovascular Disease

## 2017-01-08 ENCOUNTER — Encounter (HOSPITAL_COMMUNITY)
Admission: RE | Disposition: A | Discharge status: Home / Self Care | Payer: Self-pay | Source: Ambulatory Visit | Attending: Cardiovascular Disease

## 2017-01-08 ENCOUNTER — Encounter (HOSPITAL_COMMUNITY): Payer: Self-pay | Admitting: Certified Registered Nurse Anesthetist

## 2017-01-08 DIAGNOSIS — G4733 Obstructive sleep apnea (adult) (pediatric): Secondary | ICD-10-CM | POA: Diagnosis not present

## 2017-01-08 DIAGNOSIS — I4819 Other persistent atrial fibrillation: Secondary | ICD-10-CM

## 2017-01-08 DIAGNOSIS — I10 Essential (primary) hypertension: Secondary | ICD-10-CM | POA: Insufficient documentation

## 2017-01-08 DIAGNOSIS — R05 Cough: Secondary | ICD-10-CM | POA: Insufficient documentation

## 2017-01-08 DIAGNOSIS — Z7951 Long term (current) use of inhaled steroids: Secondary | ICD-10-CM | POA: Insufficient documentation

## 2017-01-08 DIAGNOSIS — Z7984 Long term (current) use of oral hypoglycemic drugs: Secondary | ICD-10-CM | POA: Diagnosis not present

## 2017-01-08 DIAGNOSIS — I481 Persistent atrial fibrillation: Secondary | ICD-10-CM | POA: Insufficient documentation

## 2017-01-08 DIAGNOSIS — Z7901 Long term (current) use of anticoagulants: Secondary | ICD-10-CM | POA: Diagnosis not present

## 2017-01-08 DIAGNOSIS — Z79899 Other long term (current) drug therapy: Secondary | ICD-10-CM | POA: Insufficient documentation

## 2017-01-08 DIAGNOSIS — K219 Gastro-esophageal reflux disease without esophagitis: Secondary | ICD-10-CM | POA: Insufficient documentation

## 2017-01-08 DIAGNOSIS — I4891 Unspecified atrial fibrillation: Secondary | ICD-10-CM

## 2017-01-08 DIAGNOSIS — E119 Type 2 diabetes mellitus without complications: Secondary | ICD-10-CM | POA: Insufficient documentation

## 2017-01-08 DIAGNOSIS — I4811 Longstanding persistent atrial fibrillation: Secondary | ICD-10-CM

## 2017-01-08 DIAGNOSIS — Z9989 Dependence on other enabling machines and devices: Secondary | ICD-10-CM | POA: Insufficient documentation

## 2017-01-08 DIAGNOSIS — I480 Paroxysmal atrial fibrillation: Secondary | ICD-10-CM

## 2017-01-08 DIAGNOSIS — E890 Postprocedural hypothyroidism: Secondary | ICD-10-CM | POA: Diagnosis not present

## 2017-01-08 HISTORY — PX: CARDIOVERSION: SHX1299

## 2017-01-08 SURGERY — CARDIOVERSION
Anesthesia: Monitor Anesthesia Care

## 2017-01-08 MED ORDER — PROPOFOL 10 MG/ML IV BOLUS
INTRAVENOUS | Status: DC | PRN
  Administered 2017-01-08: 60 mg via INTRAVENOUS

## 2017-01-08 MED ORDER — LIDOCAINE HCL (CARDIAC) 20 MG/ML IV SOLN
INTRAVENOUS | Status: DC | PRN
  Administered 2017-01-08: 40 mg via INTRATRACHEAL

## 2017-01-08 MED ORDER — SODIUM CHLORIDE 0.9 % IV SOLN
INTRAVENOUS | Status: DC
  Administered 2017-01-08 (×2): via INTRAVENOUS

## 2017-01-08 NOTE — H&P (View-Only) (Signed)
Primary Care Physician: Irven Shelling, MD Cardiologist: [name] Referral provider: Curly [place], B.,PA   [name] is a 83 y.o. female with a h/o afib that has been on amiodarone since June of 2015. She has had recent afib in the setting of persistent cough x 6-8 weeks  that has not responded to two rounds of prednisone and antibiotics. She has also been using cough syrup with decongestants. Her Cardizem was increased and as of 2/14, BB was added with good slowing of v rate and with slower heart rate, she does feel better but persists in afib. She is not really wanting a cardioversion. Her biggest concern is this cough. She did have a CXR and other than a large hiatal hernia, there were no other abnormalities noted. TSH has also been mildly elevated but PCP aware and is monitoring. Therefore, increasing amiodarone has not been attempted.Continues on eliquis.  Returns to afib clinic 2/23. She has been to see pulmonology and the etiology of the cough was felt not to be amiodarone but a large hiatal hernia. She is pending 2 more pulmonology tests. She is now ready to be cardioverted and will schedule this next week. She has not missed any dose of DOAC.  Today, she denies symptoms of chest pain, shortness of breath, orthopnea, PND, lower extremity edema, dizziness, presyncope, syncope, or neurologic sequela.Perisistent cough. The patient is tolerating medications without difficulties and is otherwise without complaint today.   Past Medical History:  Diagnosis Date  . Allergy    SEASONAL  . Anemia    years ago  . Arthritis    "all my joints" (03/31/2014)  . Atrial fibrillation ([place])   . Breast cancer [facility])    s/p right breast lumpectomy  . Cervical cancer ([place])   . DJD (degenerative joint disease) of knee   . Dysrhythmia    Afib  . GERD (gastroesophageal reflux disease)   . Glaucoma   . H/O hiatal hernia   . Heart murmur   . Hx of transesophageal echocardiography (TEE) for  monitoring    TEE (03/2014): No LAA clot, moderate LAE, core triatriatum type structure in RA with no stenosis, normal EF 55-60%, mild MR  . Hypertension   . Hypothyroidism, postsurgical   . Migraine    "last one was years ago" (03/31/2014)  . OSA on CPAP   . PONV (postoperative nausea and vomiting)   . Sleep apnea   . Thyroid cancer [facility])    s/p thyroidectomy  . Type II diabetes mellitus ([place])    Past Surgical History:  Procedure Laterality Date  . ABDOMINAL HYSTERECTOMY     "partial"  . BREAST BIOPSY Right   . BREAST LUMPECTOMY Right   . CARDIOVERSION N/A 03/30/2014   Procedure: CARDIOVERSION - BEDSIDE;  Surgeon: [name], MD;  Location: [place];  Service: Cardiovascular;  Laterality: N/A;  . CARDIOVERSION N/A 03/31/2014   Procedure: CARDIOVERSION  (BEDSIDE) ;  Surgeon: Lelon Perla, MD;  Location: [place];  Service: Cardiovascular;  Laterality: N/A;  . CARDIOVERSION N/A 04/27/2014   Procedure: CARDIOVERSION;  Surgeon: Darlin Coco, MD;  Location: [facility] ENDOSCOPY;  Service: Cardiovascular;  Laterality: N/A;  . CARDIOVERSION N/A 04/13/2015   Procedure: CARDIOVERSION;  Surgeon: Thayer Headings, MD;  Location: [place];  Service: Cardiovascular;  Laterality: N/A;  . CARPAL TUNNEL RELEASE Right   . CATARACT EXTRACTION W/ INTRAOCULAR LENS  IMPLANT, BILATERAL Bilateral   . COLONOSCOPY    . EXCISIONAL HEMORRHOIDECTOMY    .  POLYPECTOMY    . TEE WITHOUT CARDIOVERSION N/A 03/30/2014   Procedure: TRANSESOPHAGEAL ECHOCARDIOGRAM (TEE);  Surgeon: [name], MD;  Location: [facility] ENDOSCOPY;  Service: Cardiovascular;  Laterality: N/A;  . THYROIDECTOMY    . TONSILLECTOMY      Current Outpatient Prescriptions  Medication Sig Dispense Refill  . acetaminophen (TYLENOL) 650 MG CR tablet Take 1,300 mg by mouth every 8 (eight) hours.    Marland Kitchen amiodarone (PACERONE) 200 MG tablet TAKE 1 TABLET BY MOUTH  DAILY 90 tablet 0  . bimatoprost (LUMIGAN) 0.01 % SOLN Place 1 drop into both eyes at bedtime.  Eye drop    . calcium citrate-vitamin D (CITRACAL+D) 315-200 MG-UNIT tablet Take 1 tablet by mouth 2 (two) times daily.     Marland Kitchen diltiazem (CARDIZEM CD) 360 MG 24 hr capsule Take 360 mg by mouth daily.    Marland Kitchen ELIQUIS 5 MG TABS tablet TAKE 1 TABLET BY MOUTH TWO  TIMES DAILY 180 tablet 3  . FeFum-FePo-FA-B Cmp-C-Zn-Mn-Cu (TANDEM PLUS) 162-115.2-1 MG CAPS Take 1 tablet by mouth daily as needed (low iron).     . fluticasone (FLONASE) 50 MCG/ACT nasal spray Place 1 spray into both nostrils daily. Reported on 02/25/2016    . lansoprazole (PREVACID) 30 MG capsule Take 1 capsule (30 mg total) by mouth 2 (two) times daily before a meal. 180 capsule 2  . levothyroxine (SYNTHROID, LEVOTHROID) 150 MCG tablet Take 150 mcg by mouth daily before breakfast.    . LORazepam (ATIVAN) 1 MG tablet Take 1.5 mg by mouth at bedtime.    . Lutein 6 MG CAPS Take 6 mg by mouth 2 (two) times daily as needed (for vegetable support).    . Magnesium 250 MG TABS Take 2 tablets by mouth.    . metFORMIN (GLUMETZA) 500 MG (MOD) 24 hr tablet Take 500 mg by mouth 2 (two) times daily with a meal.     . metoprolol (LOPRESSOR) 50 MG tablet Take 1 tablet (50 mg total) by mouth 2 (two) times daily. 180 tablet 3  . multivitamin-lutein (OCUVITE-LUTEIN) CAPS capsule Take 1 capsule by mouth daily.    . ONE TOUCH ULTRA TEST test strip Use as directed    . ONETOUCH DELICA LANCETS 99991111 MISC Use as directed    . potassium chloride SA (K-DUR,KLOR-CON) 20 MEQ tablet Take 3 tablets (60 mEq total) by mouth daily. 270 tablet 3  . rosuvastatin (CRESTOR) 20 MG tablet Take 10 mg by mouth at bedtime.    . sertraline (ZOLOFT) 50 MG tablet Take 50 mg by mouth daily.    . Cyanocobalamin (B-12 PO) Take 1 tablet by mouth daily as needed (for energy).      No current facility-administered medications for this encounter.     Allergies  Allergen Reactions  . Penicillins Itching and Rash    Has patient had a PCN reaction causing immediate rash,  facial/tongue/throat swelling, SOB or lightheadedness with hypotension: Unknown, childhood reaction Has patient had a PCN reaction causing severe rash involving mucus membranes or skin necrosis: No Has patient had a PCN reaction that required hospitalization No Has patient had a PCN reaction occurring within the last 10 years: No If all of the above answers are "NO", then may proceed with Cephalosporin use.     Social History   Social History  . Marital status: Divorced    Spouse name: N/A  . Number of children: 2  . Years of education: N/A   Occupational History  . Retired  Social History Main Topics  . Smoking status: Never Smoker  . Smokeless tobacco: Never Used  . Alcohol use No  . Drug use: No  . Sexual activity: No   Other Topics Concern  . Not on file   Social History Narrative  . No narrative on file    Family History  Problem Relation Age of Onset  . Prostate cancer Father   . Diabetes Sister   . Heart disease Sister   . Heart attack Brother   . Heart disease Brother   . Diabetes Brother   . Diabetes Sister   . Diabetes Sister   . Pancreatic cancer Sister   . Diabetes Brother   . Heart disease Brother   . Diabetes Brother   . Heart disease Brother   . Diabetes Brother   . Heart disease Brother   . Diabetes Brother   . Heart disease Brother   . Diabetes Brother   . Heart disease Brother   . Diabetes Brother     ROS- All systems are reviewed and negative except as per the HPI above  Physical Exam: Vitals:   01/05/17 1036  BP: (!) 146/90  Pulse: (!) 107  Weight: 198 lb (89.8 kg)  Height: 5\' 6"  (1.676 m)   Wt Readings from Last 3 Encounters:  01/05/17 198 lb (89.8 kg)  01/02/17 197 lb (89.4 kg)  12/29/16 199 lb 3.2 oz (90.4 kg)    Labs: Lab Results  Component Value Date   NA 140 01/05/2017   K 4.5 01/05/2017   CL 107 01/05/2017   CO2 26 01/05/2017   GLUCOSE 133 (H) 01/05/2017   BUN 14 01/05/2017   CREATININE 0.89 01/05/2017    CALCIUM 9.5 01/05/2017   MG 1.8 03/28/2014   Lab Results  Component Value Date   INR 1.12 04/20/2016   No results found for: CHOL, HDL, LDLCALC, TRIG   GEN- The patient is well appearing, alert and oriented x 3 today.   Head- normocephalic, atraumatic Eyes-  Sclera clear, conjunctiva pink Ears- hearing intact Oropharynx- clear Neck- supple, no JVP Lymph- no cervical lymphadenopathy Lungs- Clear to ausculation bilaterally, normal work of breathing Heart- irregular rate and rhythm, no murmurs, rubs or gallops, PMI not laterally displaced GI- soft, NT, ND, + BS Extremities- no clubbing, cyanosis, or edema MS- no significant deformity or atrophy Skin- no rash or lesion Psych- euthymic mood, full affect Neuro- strength and sensation are intact  EKG- afib at 107 bpm, qrs int 92 ms, qtc 467 ms Epic records reviewed Cxr-IMPRESSION: There is a large hiatal hernia with worsening gaseous distention. The hernia measures at least 9.8 x 9.4 cm. Moderate gas noted within esophagus. No infiltrate or pulmonary edema. Surgical clips are noted in right axilla.    Assessment and Plan: 1. Persistent afib Remains in afib with recent addition of BB, but with slower v rates and pt feels improved but not as well as when in SR She is now ready for cardioversion, has required one a year for the last couple of years, but since it holds for a long  period of time, feel it is reasonable to do Continue amiodarone at 200 mg a day Continue Cardizem at 360 mg a day Continue eliquis 5 mg bid, states no missed doses x 3-4 weeks  2. Persistent cough Has had 2 rounds of prednisone and antibiotics, cough syrup with decongestants without resolution Dr. Elsworth Soho believes cough is 2/2 hiatal hernia and can continue amiodraone Try to avoid  decongestants as may make AF harder to treat  Will see back in one week after cardioversion  Butch Penny C. Shalom Ware, [facility] [address] [place], [place] [address] [phone]

## 2017-01-08 NOTE — Discharge Instructions (Signed)
Electrical Cardioversion, Care After °This sheet gives you information about how to care for yourself after your procedure. Your health care provider may also give you more specific instructions. If you have problems or questions, contact your health care provider. °What can I expect after the procedure? °After the procedure, it is common to have: °· Some redness on the skin where the shocks were given. °Follow these instructions at home: °· Do not drive for 24 hours if you were given a medicine to help you relax (sedative). °· Take over-the-counter and prescription medicines only as told by your health care provider. °· Ask your health care provider how to check your pulse. Check it often. °· Rest for 48 hours after the procedure or as told by your health care provider. °· Avoid or limit your caffeine use as told by your health care provider. °Contact a health care provider if: °· You feel like your heart is beating too quickly or your pulse is not regular. °· You have a serious muscle cramp that does not go away. °Get help right away if: °· You have discomfort in your chest. °· You are dizzy or you feel faint. °· You have trouble breathing or you are short of breath. °· Your speech is slurred. °· You have trouble moving an arm or leg on one side of your body. °· Your fingers or toes turn cold or blue. °This information is not intended to replace advice given to you by your health care provider. Make sure you discuss any questions you have with your health care provider. °Document Released: 08/20/2013 Document Revised: 06/02/2016 Document Reviewed: 05/05/2016 °Elsevier Interactive Patient Education © 2017 [employer] Inc[employer] ° °

## 2017-01-08 NOTE — Transfer of Care (Signed)
Immediate Anesthesia Transfer of Care Note  Patient: [name]  Procedure(s) Performed: Procedure(s): CARDIOVERSION (N/A)  Patient Location: Endoscopy Unit  Anesthesia Type:General  Level of Consciousness: awake, alert , oriented and patient cooperative  Airway & Oxygen Therapy: Patient Spontanous Breathing and Patient connected to nasal cannula oxygen  Post-op Assessment: Report given to RN and Post -op Vital signs reviewed and stable  Post vital signs: Reviewed and stable  Last Vitals:  Vitals:   01/08/17 0930  BP: (!) 120/55  Pulse: (!) 106  Resp: 14  Temp: 36.7 C    Last Pain:  Vitals:   01/08/17 0930  TempSrc: Oral         Complications: No apparent anesthesia complications

## 2017-01-08 NOTE — Anesthesia Postprocedure Evaluation (Addendum)
Anesthesia Post Note  Patient: [name]  Procedure(s) Performed: Procedure(s) (LRB): CARDIOVERSION (N/A)  Patient location during evaluation: PACU Anesthesia Type: General Level of consciousness: awake, awake and alert and oriented Pain management: pain level controlled Vital Signs Assessment: post-procedure vital signs reviewed and stable Respiratory status: spontaneous breathing, nonlabored ventilation and respiratory function stable Cardiovascular status: blood pressure returned to baseline Anesthetic complications: no       Last Vitals:  Vitals:   01/08/17 1120 01/08/17 1130  BP: 133/73 135/64  Pulse: (!) 52 (!) 52  Resp: 15 (!) 27  Temp:      Last Pain:  Vitals:   01/08/17 1111  TempSrc: Oral                 Doloras Tellado COKER

## 2017-01-08 NOTE — Interval H&P Note (Signed)
History and Physical Interval Note:  01/08/2017 9:20 AM  [name]  has presented today for surgery, with the diagnosis of AFIB  The various methods of treatment have been discussed with the patient and family. After consideration of risks, benefits and other options for treatment, the patient has consented to  Procedure(s): CARDIOVERSION (N/A) as a surgical intervention .  The patient's history has been reviewed, patient examined, no change in status, stable for surgery.  I have reviewed the patient's chart and labs.  Questions were answered to the patient's satisfaction.     [name]

## 2017-01-08 NOTE — Op Note (Signed)
Procedure: Electrical Cardioversion Indications:  Atrial Fibrillation  Procedure Details:  Consent: Risks of procedure as well as the alternatives and risks of each were explained to the (patient/caregiver).  Consent for procedure obtained.  Time Out: Verified patient identification, verified procedure, site/side was marked, verified correct patient position, special equipment/implants available, medications/allergies/relevent history reviewed, required imaging and test results available.  Performed  Patient placed on cardiac monitor, pulse oximetry, supplemental oxygen as necessary.  Sedation given: propofol 60 mg IV, Dr. Linna Caprice, Anesthesiology Pacer pads placed anterior and posterior chest.  Cardioverted 1 time(s).  Cardioversion with synchronized biphasic 120J shock.  Evaluation: Findings: Post procedure EKG shows: NSR Complications: None Patient did tolerate procedure well.  Time Spent Directly with the Patient:  30 minutes   [name] 01/08/2017, 11:10 AM

## 2017-01-08 NOTE — Anesthesia Preprocedure Evaluation (Signed)
Anesthesia Evaluation  Patient identified by MRN, date of birth, ID band Patient awake    Reviewed: Allergy & Precautions, NPO status , Patient's Chart, lab work & pertinent test results  Airway Mallampati: II  TM Distance: >3 FB Neck ROM: Full    Dental  (+) Teeth Intact, Dental Advisory Given   Pulmonary    breath sounds clear to auscultation       Cardiovascular hypertension,  Rhythm:Irregular Rate:Normal     Neuro/Psych    GI/Hepatic   Endo/Other  diabetes  Renal/GU      Musculoskeletal   Abdominal   Peds  Hematology   Anesthesia Other Findings   Reproductive/Obstetrics                             Anesthesia Physical Anesthesia Plan  ASA: III  Anesthesia Plan: General   Post-op Pain Management:    Induction: Intravenous  Airway Management Planned: Mask  Additional Equipment:   Intra-op Plan:   Post-operative Plan:   Informed Consent: I have reviewed the patients History and Physical, chart, labs and discussed the procedure including the risks, benefits and alternatives for the proposed anesthesia with the patient or authorized representative who has indicated his/her understanding and acceptance.     Plan Discussed with: CRNA and Anesthesiologist  Anesthesia Plan Comments:         Anesthesia Quick Evaluation

## 2017-01-12 NOTE — Addendum Note (Signed)
Encounter addended by: Sherran Needs, NP on: 01/12/2017  9:51 AM<BR>    Actions taken: LOS modified

## 2017-01-15 ENCOUNTER — Telehealth: Payer: Self-pay | Admitting: Cardiovascular Disease

## 2017-01-15 NOTE — Telephone Encounter (Signed)
New message      Nurse is asking [place] to call pt and go over her medications with her.  She states a CNA was there to bath her and stated that pt had zero energy and could not think.  It appears that when pt takes her metoprolol bid, her heart rate falls in the 40's.  Pt's heart rate is normally in the 60's.  There is concern on other medications and whether pt is taking them correctly.  If any questions, please call

## 2017-01-15 NOTE — Telephone Encounter (Signed)
I spoke with the pt and her pulse is currently 60.  The pt said that after her DCCV the MD did not give any instructions in regards to making medication adjustments.  The pt continued her medications which included amiodarone, diltiazem and metoprolol tartrate.  The pt has been tired since her DCCV and when she took Metoprolol yesterday morning her pulse dropped to 47. The pt has not taken anymore metoprolol since that time. I advised the pt to remain off of this medication and I moved up her AFib clinic appointment to tomorrow at 3:00.  The pt was very appreciative of my phone call and she will discuss her medication further at her appointment.

## 2017-01-16 ENCOUNTER — Encounter (HOSPITAL_COMMUNITY): Payer: Self-pay | Admitting: Nurse Practitioner

## 2017-01-16 ENCOUNTER — Ambulatory Visit (HOSPITAL_COMMUNITY)
Admission: RE | Admit: 2017-01-16 | Discharge: 2017-01-16 | Disposition: A | Discharge status: Home / Self Care | Payer: Medicare Other | Source: Ambulatory Visit | Attending: Nurse Practitioner | Admitting: Nurse Practitioner

## 2017-01-16 VITALS — BP 138/78 | HR 74 | Ht 66.0 in | Wt 195.2 lb

## 2017-01-16 DIAGNOSIS — E119 Type 2 diabetes mellitus without complications: Secondary | ICD-10-CM | POA: Insufficient documentation

## 2017-01-16 DIAGNOSIS — E890 Postprocedural hypothyroidism: Secondary | ICD-10-CM | POA: Insufficient documentation

## 2017-01-16 DIAGNOSIS — R05 Cough: Secondary | ICD-10-CM | POA: Diagnosis present

## 2017-01-16 DIAGNOSIS — Z7984 Long term (current) use of oral hypoglycemic drugs: Secondary | ICD-10-CM | POA: Diagnosis not present

## 2017-01-16 DIAGNOSIS — Z85850 Personal history of malignant neoplasm of thyroid: Secondary | ICD-10-CM | POA: Insufficient documentation

## 2017-01-16 DIAGNOSIS — Z880 Allergy status to penicillin: Secondary | ICD-10-CM | POA: Diagnosis not present

## 2017-01-16 DIAGNOSIS — Z8541 Personal history of malignant neoplasm of cervix uteri: Secondary | ICD-10-CM | POA: Diagnosis not present

## 2017-01-16 DIAGNOSIS — Z853 Personal history of malignant neoplasm of breast: Secondary | ICD-10-CM | POA: Diagnosis not present

## 2017-01-16 DIAGNOSIS — G4733 Obstructive sleep apnea (adult) (pediatric): Secondary | ICD-10-CM | POA: Diagnosis not present

## 2017-01-16 DIAGNOSIS — Z7901 Long term (current) use of anticoagulants: Secondary | ICD-10-CM | POA: Insufficient documentation

## 2017-01-16 DIAGNOSIS — K449 Diaphragmatic hernia without obstruction or gangrene: Secondary | ICD-10-CM | POA: Diagnosis not present

## 2017-01-16 DIAGNOSIS — I10 Essential (primary) hypertension: Secondary | ICD-10-CM | POA: Insufficient documentation

## 2017-01-16 DIAGNOSIS — R001 Bradycardia, unspecified: Secondary | ICD-10-CM | POA: Insufficient documentation

## 2017-01-16 DIAGNOSIS — H409 Unspecified glaucoma: Secondary | ICD-10-CM | POA: Insufficient documentation

## 2017-01-16 DIAGNOSIS — I4819 Other persistent atrial fibrillation: Secondary | ICD-10-CM

## 2017-01-16 DIAGNOSIS — I481 Persistent atrial fibrillation: Secondary | ICD-10-CM | POA: Insufficient documentation

## 2017-01-16 DIAGNOSIS — K219 Gastro-esophageal reflux disease without esophagitis: Secondary | ICD-10-CM | POA: Insufficient documentation

## 2017-01-16 NOTE — Progress Notes (Signed)
Primary Care Physician: Irven Shelling, MD Cardiologist: [name] Referral provider: Curly [place], B.,PA   [name] is a 83 y.o. female with a h/o afib that has been on amiodarone since June of 2015. She has had recent afib in the setting of persistent cough x 6-8 weeks  that has not responded to two rounds of prednisone and antibiotics. She has also been using cough syrup with decongestants. Her Cardizem was increased and as of 2/14, BB was added with good slowing of v rate and with slower heart rate, she does feel better but persists in afib. She is not really wanting a cardioversion. Her biggest concern is this cough. She did have a CXR and other than a large hiatal hernia, there were no other abnormalities noted. TSH has also been mildly elevated but PCP aware and is monitoring. Therefore, increasing amiodarone has not been attempted.Continues on eliquis.  Returns to afib clinic 2/23. She has been to see pulmonology and the etiology of the cough was felt not to be amiodarone but a large hiatal hernia. She is pending 2 more pulmonology tests. She is now ready to be cardioverted and will schedule this next week. She has not missed any dose of DOAC.   F/u afib clinic 3/6, she had successful cardioversion but had bradycardia associated with weakness and mild confusion when returned to SR and stopped her BB. She is in SR today with v rate of 74 bpm. She continues Cardizem and amiodarone. She feels improved.  Today, she denies symptoms of chest pain, shortness of breath, orthopnea, PND, lower extremity edema, dizziness, presyncope, syncope, or neurologic sequela.Perisistent cough. The patient is tolerating medications without difficulties and is otherwise without complaint today.   Past Medical History:  Diagnosis Date  . Allergy    SEASONAL  . Anemia    years ago  . Arthritis    "all my joints" (03/31/2014)  . Atrial fibrillation ([place])   . Breast cancer [facility])    s/p right breast  lumpectomy  . Cervical cancer ([place])   . DJD (degenerative joint disease) of knee   . Dysrhythmia    Afib  . GERD (gastroesophageal reflux disease)   . Glaucoma   . H/O hiatal hernia   . Heart murmur   . Hx of transesophageal echocardiography (TEE) for monitoring    TEE (03/2014): No LAA clot, moderate LAE, core triatriatum type structure in RA with no stenosis, normal EF 55-60%, mild MR  . Hypertension   . Hypothyroidism, postsurgical   . Migraine    "last one was years ago" (03/31/2014)  . OSA on CPAP   . PONV (postoperative nausea and vomiting)   . Sleep apnea   . Thyroid cancer [facility])    s/p thyroidectomy  . Type II diabetes mellitus ([place])    Past Surgical History:  Procedure Laterality Date  . ABDOMINAL HYSTERECTOMY     "partial"  . BREAST BIOPSY Right   . BREAST LUMPECTOMY Right   . CARDIOVERSION N/A 03/30/2014   Procedure: CARDIOVERSION - BEDSIDE;  Surgeon: [name], MD;  Location: [place];  Service: Cardiovascular;  Laterality: N/A;  . CARDIOVERSION N/A 03/31/2014   Procedure: CARDIOVERSION  (BEDSIDE) ;  Surgeon: Lelon Perla, MD;  Location: [place];  Service: Cardiovascular;  Laterality: N/A;  . CARDIOVERSION N/A 04/27/2014   Procedure: CARDIOVERSION;  Surgeon: Darlin Coco, MD;  Location: [facility] ENDOSCOPY;  Service: Cardiovascular;  Laterality: N/A;  . CARDIOVERSION N/A 04/13/2015   Procedure: CARDIOVERSION;  Surgeon:  Thayer Headings, MD;  Location: [place];  Service: Cardiovascular;  Laterality: N/A;  . CARDIOVERSION N/A 01/08/2017   Procedure: CARDIOVERSION;  Surgeon: Sanda Klein, MD;  Location: [facility] ENDOSCOPY;  Service: Cardiovascular;  Laterality: N/A;  . CARPAL TUNNEL RELEASE Right   . CATARACT EXTRACTION W/ INTRAOCULAR LENS  IMPLANT, BILATERAL Bilateral   . COLONOSCOPY    . EXCISIONAL HEMORRHOIDECTOMY    . POLYPECTOMY    . TEE WITHOUT CARDIOVERSION N/A 03/30/2014   Procedure: TRANSESOPHAGEAL ECHOCARDIOGRAM (TEE);  Surgeon: [name], MD;  Location:  [facility] ENDOSCOPY;  Service: Cardiovascular;  Laterality: N/A;  . THYROIDECTOMY    . TONSILLECTOMY      Current Outpatient Prescriptions  Medication Sig Dispense Refill  . acetaminophen (TYLENOL) 650 MG CR tablet Take 1,300 mg by mouth every 8 (eight) hours.    Marland Kitchen amiodarone (PACERONE) 200 MG tablet TAKE 1 TABLET BY MOUTH  DAILY 90 tablet 0  . bimatoprost (LUMIGAN) 0.01 % SOLN Place 1 drop into both eyes at bedtime. Eye drop    . calcium citrate-vitamin D (CITRACAL+D) 315-200 MG-UNIT tablet Take 1 tablet by mouth 2 (two) times daily.     . Cyanocobalamin (B-12 PO) Take 1 tablet by mouth daily as needed (for energy).     Marland Kitchen diltiazem (CARDIZEM CD) 360 MG 24 hr capsule Take 360 mg by mouth daily.    Marland Kitchen ELIQUIS 5 MG TABS tablet TAKE 1 TABLET BY MOUTH TWO  TIMES DAILY 180 tablet 3  . FeFum-FePo-FA-B Cmp-C-Zn-Mn-Cu (TANDEM PLUS) 162-115.2-1 MG CAPS Take 1 tablet by mouth daily as needed (low iron).     . fluticasone (FLONASE) 50 MCG/ACT nasal spray Place 1 spray into both nostrils daily. Reported on 02/25/2016    . lansoprazole (PREVACID) 30 MG capsule Take 1 capsule (30 mg total) by mouth 2 (two) times daily before a meal. 180 capsule 2  . levothyroxine (SYNTHROID, LEVOTHROID) 150 MCG tablet Take 150 mcg by mouth daily before breakfast.    . LORazepam (ATIVAN) 1 MG tablet Take 1.5 mg by mouth at bedtime.    . Lutein 6 MG CAPS Take 6 mg by mouth 2 (two) times daily as needed (for vegetable support).    . Magnesium 250 MG TABS Take 2 tablets by mouth.    . metFORMIN (GLUMETZA) 500 MG (MOD) 24 hr tablet Take 500 mg by mouth 2 (two) times daily with a meal.     . multivitamin-lutein (OCUVITE-LUTEIN) CAPS capsule Take 1 capsule by mouth daily.    . ONE TOUCH ULTRA TEST test strip Use as directed    . ONETOUCH DELICA LANCETS 99991111 MISC Use as directed    . potassium chloride SA (K-DUR,KLOR-CON) 20 MEQ tablet Take 3 tablets (60 mEq total) by mouth daily. 270 tablet 3  . rosuvastatin (CRESTOR) 20 MG tablet Take  10 mg by mouth at bedtime.    . sertraline (ZOLOFT) 50 MG tablet Take 50 mg by mouth daily.     No current facility-administered medications for this encounter.     Allergies  Allergen Reactions  . Penicillins Itching and Rash    Has patient had a PCN reaction causing immediate rash, facial/tongue/throat swelling, SOB or lightheadedness with hypotension: Unknown, childhood reaction Has patient had a PCN reaction causing severe rash involving mucus membranes or skin necrosis: No Has patient had a PCN reaction that required hospitalization No Has patient had a PCN reaction occurring within the last 10 years: No If all of the above answers are "NO",  then may proceed with Cephalosporin use.     Social History   Social History  . Marital status: Divorced    Spouse name: N/A  . Number of children: 2  . Years of education: N/A   Occupational History  . Retired    Social History Main Topics  . Smoking status: Never Smoker  . Smokeless tobacco: Never Used  . Alcohol use No  . Drug use: No  . Sexual activity: No   Other Topics Concern  . Not on file   Social History Narrative  . No narrative on file    Family History  Problem Relation Age of Onset  . Prostate cancer Father   . Diabetes Sister   . Heart disease Sister   . Heart attack Brother   . Heart disease Brother   . Diabetes Brother   . Diabetes Sister   . Diabetes Sister   . Pancreatic cancer Sister   . Diabetes Brother   . Heart disease Brother   . Diabetes Brother   . Heart disease Brother   . Diabetes Brother   . Heart disease Brother   . Diabetes Brother   . Heart disease Brother   . Diabetes Brother   . Heart disease Brother   . Diabetes Brother     ROS- All systems are reviewed and negative except as per the HPI above  Physical Exam: Vitals:   01/16/17 1445  BP: 138/78  Pulse: 74  Weight: 195 lb 3.2 oz (88.5 kg)  Height: 5\' 6"  (1.676 m)   Wt Readings from Last 3 Encounters:  01/16/17 195  lb 3.2 oz (88.5 kg)  01/05/17 198 lb (89.8 kg)  01/02/17 197 lb (89.4 kg)    Labs: Lab Results  Component Value Date   NA 140 01/05/2017   K 4.5 01/05/2017   CL 107 01/05/2017   CO2 26 01/05/2017   GLUCOSE 133 (H) 01/05/2017   BUN 14 01/05/2017   CREATININE 0.89 01/05/2017   CALCIUM 9.5 01/05/2017   MG 1.8 03/28/2014   Lab Results  Component Value Date   INR 1.12 04/20/2016   No results found for: CHOL, HDL, LDLCALC, TRIG   GEN- The patient is well appearing, alert and oriented x 3 today.   Head- normocephalic, atraumatic Eyes-  Sclera clear, conjunctiva pink Ears- hearing intact Oropharynx- clear Neck- supple, no JVP Lymph- no cervical lymphadenopathy Lungs- Clear to ausculation bilaterally, normal work of breathing Heart- irregular rate and rhythm, no murmurs, rubs or gallops, PMI not laterally displaced GI- soft, NT, ND, + BS Extremities- no clubbing, cyanosis, or edema MS- no significant deformity or atrophy Skin- no rash or lesion Psych- euthymic mood, full affect Neuro- strength and sensation are intact  EKG- afib at 107 bpm, qrs int 92 ms, qtc 467 ms Epic records reviewed Cxr-IMPRESSION: There is a large hiatal hernia with worsening gaseous distention. The hernia measures at least 9.8 x 9.4 cm. Moderate gas noted within esophagus. No infiltrate or pulmonary edema. Surgical clips are noted in right axilla.    Assessment and Plan: 1. Persistent afib Successful cardioversion, but with bradycardia, not improved off BB Encouraged to go back to regular exercise, she has been afraid to go back to her exercise bike. She will need to go slow and build back slowly to her previous tolerance Continue amiodarone 200 mg qd Continue cardizem 360 mg qd Continue eliquis 5 mg bid  2. Persistent cough Has had 2 rounds of prednisone and antibiotics, cough  syrup with decongestants without resolution Dr. Elsworth Soho believes cough is 2/2 hiatal hernia and can continue  amiodraone Try to avoid decongestants as may make AF harder to treat She is pending further pulmonology w/u for hiatal hernia  Will see back in 6 weeks F/u with [name] 6/8  Butch Penny C. Brendan Gruwell, [facility] [address] [place], [place] [address] [phone]

## 2017-01-18 ENCOUNTER — Ambulatory Visit (HOSPITAL_COMMUNITY): Payer: Medicare Other | Admitting: Nurse Practitioner

## 2017-01-19 ENCOUNTER — Telehealth: Payer: Self-pay | Admitting: Pulmonary Disease

## 2017-01-19 NOTE — Telephone Encounter (Signed)
Spoke with Joy at [employer]. Joy verbalized understanding. Nothing else was needed.

## 2017-01-19 NOTE — Telephone Encounter (Signed)
Ok for barium

## 2017-01-19 NOTE — Telephone Encounter (Signed)
Spoke with Joy at [facility] radiology- states that DG UGI ordered was with water- Per Joy this test is typically done with barium to better evaluate for hernia. Joy is requesting clarification if pt's test can be done with barium.  RA please advise- this test is scheduled for Monday.  Thanks!

## 2017-01-22 ENCOUNTER — Ambulatory Visit (HOSPITAL_COMMUNITY)
Admission: RE | Admit: 2017-01-22 | Discharge: 2017-01-22 | Disposition: A | Discharge status: Home / Self Care | Payer: Medicare Other | Source: Ambulatory Visit | Attending: Pulmonary Disease | Admitting: Pulmonary Disease

## 2017-01-22 DIAGNOSIS — K449 Diaphragmatic hernia without obstruction or gangrene: Secondary | ICD-10-CM | POA: Diagnosis present

## 2017-01-22 DIAGNOSIS — K314 Gastric diverticulum: Secondary | ICD-10-CM | POA: Insufficient documentation

## 2017-01-25 ENCOUNTER — Other Ambulatory Visit: Payer: Self-pay

## 2017-01-25 DIAGNOSIS — K449 Diaphragmatic hernia without obstruction or gangrene: Secondary | ICD-10-CM

## 2017-01-30 ENCOUNTER — Ambulatory Visit (HOSPITAL_COMMUNITY)
Admission: RE | Admit: 2017-01-30 | Discharge: 2017-01-30 | Disposition: A | Discharge status: Home / Self Care | Payer: Medicare Other | Source: Ambulatory Visit | Attending: Pulmonary Disease | Admitting: Pulmonary Disease

## 2017-01-30 ENCOUNTER — Ambulatory Visit (INDEPENDENT_AMBULATORY_CARE_PROVIDER_SITE_OTHER): Payer: Medicare Other | Admitting: Adult Health

## 2017-01-30 ENCOUNTER — Encounter: Payer: Self-pay | Admitting: Adult Health

## 2017-01-30 DIAGNOSIS — T462X1A Poisoning by other antidysrhythmic drugs, accidental (unintentional), initial encounter: Secondary | ICD-10-CM

## 2017-01-30 DIAGNOSIS — J984 Other disorders of lung: Secondary | ICD-10-CM | POA: Diagnosis not present

## 2017-01-30 DIAGNOSIS — K219 Gastro-esophageal reflux disease without esophagitis: Secondary | ICD-10-CM | POA: Diagnosis not present

## 2017-01-30 DIAGNOSIS — R05 Cough: Secondary | ICD-10-CM | POA: Diagnosis not present

## 2017-01-30 DIAGNOSIS — R053 Chronic cough: Secondary | ICD-10-CM

## 2017-01-30 LAB — PULMONARY FUNCTION TEST
DL/VA % PRED: 63 %
DL/VA: 3.21 ml/min/mmHg/L
DLCO UNC: 14.17 ml/min/mmHg
DLCO unc % pred: 52 %
FEF 25-75 PRE: 1.28 L/s
FEF2575-%PRED-PRE: 96 %
FEV1-%PRED-PRE: 88 %
FEV1-PRE: 1.76 L
FEV1FVC-%Pred-Pre: 98 %
FEV6-%PRED-PRE: 96 %
FEV6-PRE: 2.44 L
FEV6FVC-%PRED-PRE: 105 %
FVC-%PRED-PRE: 91 %
FVC-Pre: 2.45 L
Pre FEV1/FVC ratio: 72 %
Pre FEV6/FVC Ratio: 100 %
RV % PRED: 103 %
RV: 2.66 L
TLC % pred: 96 %
TLC: 5.15 L

## 2017-01-30 NOTE — Patient Instructions (Addendum)
Continue on Prevacid Twice daily  .  GERD diet  Continue on Delsym 2 tsp Twice daily As needed  Cough .  Set up for HRCT Chest .  Follow up Dr. Elsworth Soho  In 6 weeks and As needed   Follow up with GI this week as planned

## 2017-01-30 NOTE — Addendum Note (Signed)
Addended by: Parke Poisson E on: 01/30/2017 04:19 PM   Modules accepted: Orders

## 2017-01-30 NOTE — Assessment & Plan Note (Signed)
Suspect is multifactoral with GERD/Lg HH ,  PFT does show sigificant drop in DLCO on Amio since 2015 will need HRCT to evaluate   Plan  Patient Instructions  Continue on Prevacid Twice daily  .  GERD diet  Continue on Delsym 2 tsp Twice daily As needed  Cough .  Set up for HRCT Chest .  Follow up Dr. Elsworth Soho  In 6 weeks and As needed   Follow up with GI this week as planned

## 2017-01-30 NOTE — Assessment & Plan Note (Signed)
GERD [place] ,  Cont on GERD diet , PPI and keep consult with GI

## 2017-01-30 NOTE — Progress Notes (Signed)
@Patient  ID: [name], female    DOB: Jun 17, 1931, 83 y.o.   MRN: [id number]  Chief Complaint  Patient presents with  . Follow-up    Referring provider: Lavone Orn, MD  HPI: 83 yo female never smoker seen for  Pulmonary consult 12/2016 for cough  Hx of A Fib on Amiodarone  Hx of large Hiatal Hernia .   01/30/2017 Follow up: Cough /PFT  Patient returns for a one-month follow-up. Patient was seen last visit for pulmonary consult for cough. She has a large diaphragmatic hernia. UGI showed large HHwith very slow progression of contrast from the hernia to distal stomach,  Was felt that her cough was most likely due to reflux. She is on amiodarone. A pulmonary function test was done today to evaluate her lung function lung function was normal with diffucing defect  with an FEV1 88%, ratio 72, FVC 91%, total lung capacity 96%, DLCO 52%.  DLCO has dropped considerably since 2015 (was 100%)  . She feels delsym does help her .  Has ov with GI this week.    Allergies  Allergen Reactions  . Penicillins Itching and Rash    Has patient had a PCN reaction causing immediate rash, facial/tongue/throat swelling, SOB or lightheadedness with hypotension: Unknown, childhood reaction Has patient had a PCN reaction causing severe rash involving mucus membranes or skin necrosis: No Has patient had a PCN reaction that required hospitalization No Has patient had a PCN reaction occurring within the last 10 years: No If all of the above answers are "NO", then may proceed with Cephalosporin use.      There is no immunization history on file for this patient.  Past Medical History:  Diagnosis Date  . Allergy    SEASONAL  . Anemia    years ago  . Arthritis    "all my joints" (03/31/2014)  . Atrial fibrillation ([place])   . Barrett's esophagus   . Breast cancer [facility])    s/p right breast lumpectomy  . Cervical cancer ([place])   . DJD (degenerative joint disease) of knee   . Dysrhythmia    Afib  .  GERD (gastroesophageal reflux disease)   . Glaucoma   . H/O hiatal hernia   . Heart murmur   . Hx of transesophageal echocardiography (TEE) for monitoring    TEE (03/2014): No LAA clot, moderate LAE, core triatriatum type structure in RA with no stenosis, normal EF 55-60%, mild MR  . Hypertension   . Hypothyroidism, postsurgical   . Migraine    "last one was years ago" (03/31/2014)  . OSA on CPAP   . PONV (postoperative nausea and vomiting)   . Sleep apnea   . Thyroid cancer [facility])    s/p thyroidectomy  . Type II diabetes mellitus ([facility])     Tobacco History: History  Smoking Status  . Never Smoker  Smokeless Tobacco  . Never Used   Counseling given: Not Answered   Outpatient Encounter Prescriptions as of 01/30/2017  Medication Sig  . acetaminophen (TYLENOL) 650 MG CR tablet Take 1,300 mg by mouth every 8 (eight) hours.  Marland Kitchen amiodarone (PACERONE) 200 MG tablet TAKE 1 TABLET BY MOUTH  DAILY  . bimatoprost (LUMIGAN) 0.01 % SOLN Place 1 drop into both eyes at bedtime. Eye drop  . calcium citrate-vitamin D (CITRACAL+D) 315-200 MG-UNIT tablet Take 1 tablet by mouth 2 (two) times daily.   . Cyanocobalamin (B-12 PO) Take 1 tablet by mouth daily as needed (for energy).   Marland Kitchen  diltiazem (CARDIZEM CD) 360 MG 24 hr capsule Take 360 mg by mouth daily.  Marland Kitchen ELIQUIS 5 MG TABS tablet TAKE 1 TABLET BY MOUTH TWO  TIMES DAILY  . FeFum-FePo-FA-B Cmp-C-Zn-Mn-Cu (TANDEM PLUS) 162-115.2-1 MG CAPS Take 1 tablet by mouth daily as needed (low iron).   . fluticasone (FLONASE) 50 MCG/ACT nasal spray Place 1 spray into both nostrils daily. Reported on 02/25/2016  . lansoprazole (PREVACID) 30 MG capsule Take 1 capsule (30 mg total) by mouth 2 (two) times daily before a meal.  . levothyroxine (SYNTHROID, LEVOTHROID) 175 MCG tablet Take 175 mcg by mouth daily before breakfast.  . LORazepam (ATIVAN) 1 MG tablet Take 1.5 mg by mouth at bedtime.  . Lutein 6 MG CAPS Take 6 mg by mouth 2 (two) times daily as needed (for  vegetable support).  . Magnesium 250 MG TABS Take 2 tablets by mouth.  . metFORMIN (GLUMETZA) 500 MG (MOD) 24 hr tablet Take 500 mg by mouth 2 (two) times daily with a meal.   . multivitamin-lutein (OCUVITE-LUTEIN) CAPS capsule Take 1 capsule by mouth daily.  . ONE TOUCH ULTRA TEST test strip Use as directed  . ONETOUCH DELICA LANCETS 39J MISC Use as directed  . potassium chloride SA (K-DUR,KLOR-CON) 20 MEQ tablet Take 3 tablets (60 mEq total) by mouth daily.  . rosuvastatin (CRESTOR) 20 MG tablet Take 10 mg by mouth at bedtime.  . sertraline (ZOLOFT) 50 MG tablet Take 50 mg by mouth daily.  . [DISCONTINUED] levothyroxine (SYNTHROID, LEVOTHROID) 150 MCG tablet Take 150 mcg by mouth daily before breakfast.   No facility-administered encounter medications on file as of 01/30/2017.      Review of Systems  Constitutional:   No  weight loss, night sweats,  Fevers, chills, fatigue, or  lassitude.  HEENT:   No headaches,  Difficulty swallowing,  Tooth/dental problems, or  Sore throat,                No sneezing, itching, ear ache, nasal congestion, post nasal drip,   CV:  No chest pain,  Orthopnea, PND, swelling in lower extremities, anasarca, dizziness, palpitations, syncope.   GI  No   bloody stools.   Resp: .  No chest wall deformity  Skin: no rash or lesions.  GU: no dysuria, change in color of urine, no urgency or frequency.  No flank pain, no hematuria   MS:  No joint pain or swelling.  No decreased range of motion.  No back pain.    Physical Exam  BP 134/62 (BP Location: Left Arm, Cuff Size: Normal)   Pulse 75   Ht 5\' 6"  (1.676 m)   Wt 190 lb (86.2 kg)   SpO2 96%   BMI 30.67 kg/m   GEN: A/Ox3; pleasant , NAD, elderly    HEENT:  [place]/AT,  EACs-clear, TMs-wnl, NOSE-clear, THROAT-clear, no lesions, no postnasal drip or exudate noted.   NECK:  Supple w/ fair ROM; no JVD; normal carotid impulses w/o bruits; no thyromegaly or nodules palpated; no lymphadenopathy.    RESP   Clear  P & A; w/o, wheezes/ rales/ or rhonchi. no accessory muscle use, no dullness to percussion  CARD:  Irregular , no m/r/g, tr to 1+ peripheral edema, pulses intact, no cyanosis or clubbing.  GI:   Soft & nt; nml bowel sounds; no organomegaly or masses detected.   Musco: Warm bil, no deformities or joint swelling noted.   Neuro: alert, no focal deficits noted.    Skin: Warm, no  lesions or rashes    Lab Results:   BMET  BNP No results found for: BNP  ProBNP No results found for: PROBNP  Imaging: Dg Ugi  W/kub  Result Date: 01/22/2017 CLINICAL DATA:  Hilar hernia. Sensation of food in the high esophagus EXAM: UPPER GI SERIES WITH KUB TECHNIQUE: After obtaining a scout radiograph a routine upper GI series was performed using thin barium FLUOROSCOPY TIME:  Fluoroscopy Time:  3.5 Radiation Exposure Index (if provided by the fluoroscopic device): 131.6 mGy Number of Acquired Spot Images: 18 COMPARISON:  CT thorax 16 2010, 10/23/2008. FINDINGS: The esophagus is patulous. Hiatal hernia is noted. Contrast flows very slowly through hiatal hernia which is tortuous. Contrast flows back and forth within the patulous esophagus. There is a large gastric diverticulum above the hemidiaphragm which slowly fills with contrast over the course exam. Contrast is very slow to move into the stomach beneath hemidiaphragm. Approximately half the stomach is below the hemidiaphragm and half above. Contrast eventually flows into the stomach and proximal duodenum. IMPRESSION: 1. Large hiatal hernia with very slow progression of oral contrast from the hernia into the more distal stomach. 2. To and fro movement of oral contrast in a patulous esophagus. 3. 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 4. No clear esophageal or gastric obstructing mass is identified. Consider endoscopy to further evaluate. Electronically Signed   By: Suzy Bouchard M.D.   On: 01/22/2017 10:19     Assessment & Plan:     Chronic cough Suspect is multifactoral with GERD/Lg HH ,  PFT does show sigificant drop in DLCO on Amio since 2015 will need HRCT to evaluate   Plan  Patient Instructions  Continue on Prevacid Twice daily  .  GERD diet  Continue on Delsym 2 tsp Twice daily As needed  Cough .  Set up for HRCT Chest .  Follow up Dr. Elsworth Soho  In 6 weeks and As needed   Follow up with GI this week as planned          Rexene Edison, NP 01/30/2017

## 2017-02-02 ENCOUNTER — Ambulatory Visit (INDEPENDENT_AMBULATORY_CARE_PROVIDER_SITE_OTHER): Payer: Medicare Other | Admitting: Nurse Practitioner

## 2017-02-02 VITALS — BP 130/78 | HR 70 | Ht 66.0 in | Wt 192.8 lb

## 2017-02-02 DIAGNOSIS — K449 Diaphragmatic hernia without obstruction or gangrene: Secondary | ICD-10-CM

## 2017-02-02 NOTE — Progress Notes (Addendum)
HPI: Patient is an 83 year old female previously followed by Dr. Olevia Perches for history of GERD / Barretts / hiatal hernia. Her last surveillance upper endoscopy with biopsies was done by Dr. Carlean Purl July 2016. No dysplasia, she was removed from surveillance program due to age. Patient had an upper GI series on the twelfth of this month for swallowing problems. A large hiatal hernia with very slow progression of contrast from the hernia into the more distal stomach was seen. She has a patulous esophagus. A large gastric diverticulum was seen extending from the hiatal hernia. Dr. Elsworth Soho requested she be evaluated by [place].  Patient describes her symptoms as being only at night when lying down. She feels like food was in her throat in supine position. No dysphagia with meals. No heartburn. No vomiting. Since learning of UGI series results patient has been holding her arms in the air and waving them around and this has actually improved her symptoms.  She actually looks and feels quite well overall.   Past Medical History:  Diagnosis Date  . Allergy    SEASONAL  . Anemia    years ago  . Arthritis    "all my joints" (03/31/2014)  . Atrial fibrillation ([place])   . Barrett's esophagus   . Breast cancer [facility])    s/p right breast lumpectomy  . Cervical cancer ([place])   . DJD (degenerative joint disease) of knee   . Dysrhythmia    Afib  . GERD (gastroesophageal reflux disease)   . Glaucoma   . H/O hiatal hernia   . Heart murmur   . Hx of transesophageal echocardiography (TEE) for monitoring    TEE (03/2014): No LAA clot, moderate LAE, core triatriatum type structure in RA with no stenosis, normal EF 55-60%, mild MR  . Hypertension   . Hypothyroidism, postsurgical   . Migraine    "last one was years ago" (03/31/2014)  . OSA on CPAP   . PONV (postoperative nausea and vomiting)   . Sleep apnea   . Thyroid cancer [facility])    s/p thyroidectomy  . Type II diabetes mellitus ([facility])     Patient's surgical  history, family medical history, social history, medications and allergies were all reviewed in Epic    Physical Exam: BP 130/78   Pulse 70   Ht 5\' 6"  (1.676 m)   Wt 192 lb 12.8 oz (87.5 kg)   SpO2 97%   BMI 31.12 kg/m   GENERAL: well developed white female in NAD PSYCH: :Pleasant, cooperative, normal affect HEENT: Normocephalic, conjunctiva pink, mucous membranes moist, neck supple without masses CARDIAC:  Regular rate, irregular rhythm. No peripheral edema PULM: Normal respiratory effort ABDOMEN:  soft, nontender, nondistended, no obvious masses, no hepatomegaly,  normal bowel sounds SKIN:  turgor, no lesions seen Musculoskeletal:  Normal muscle tone, normal strength NEURO: Alert and oriented x 3, no focal neurologic deficits  ASSESSMENT and PLAN:  1. 83 yo female with large hiatal hernia on recent UGI series.  She describes feeling like food is in her throat when lying down at night. No actual dysphagia. No vomiting or heartburn. She isn't really interested in a surgical evaluation right now. I explained to her what a hiatal lhernia is and what potential complications can arise from one.  Short of anti-reflux measures there isn't much that can be done.   2. AFib on anticoagulation   Tye Savoy , NP 02/02/2017, 1:56 PM   She has a very symptomatic and very large hiatal  hernia - surgical correction is only good option. Has advanced age and co-morbidities but seems fairly active and "younger than stated age" - I spoke to her and explained hiatal hernia repair and have suggested she see a Psychologist, sport and exercise and be considered for laparoscopic repair.  RN is making referral.  Gatha Mayer, MD, [facility]

## 2017-02-02 NOTE — Progress Notes (Signed)
Reviewed & agree with plan  

## 2017-02-02 NOTE — Patient Instructions (Signed)
We will call you after [name] speaks with Dr. Carlean Purl regarding your hernia.

## 2017-02-05 ENCOUNTER — Ambulatory Visit (INDEPENDENT_AMBULATORY_CARE_PROVIDER_SITE_OTHER)
Admission: RE | Admit: 2017-02-05 | Discharge: 2017-02-05 | Disposition: A | Discharge status: Home / Self Care | Payer: Medicare Other | Source: Ambulatory Visit | Attending: Adult Health | Admitting: Adult Health

## 2017-02-05 DIAGNOSIS — R05 Cough: Secondary | ICD-10-CM | POA: Diagnosis not present

## 2017-02-05 DIAGNOSIS — R053 Chronic cough: Secondary | ICD-10-CM

## 2017-02-06 NOTE — Progress Notes (Signed)
Spoke with pt and informed her of results. Pt verbalized understanding and did not have any additional questions. Nothing further is needed.

## 2017-02-07 ENCOUNTER — Telehealth: Payer: Self-pay

## 2017-02-08 ENCOUNTER — Other Ambulatory Visit: Payer: Self-pay

## 2017-02-13 ENCOUNTER — Other Ambulatory Visit: Payer: Self-pay | Admitting: Cardiovascular Disease

## 2017-02-13 DIAGNOSIS — E876 Hypokalemia: Secondary | ICD-10-CM

## 2017-02-14 ENCOUNTER — Telehealth: Payer: Self-pay | Admitting: Nurse Practitioner

## 2017-02-15 NOTE — Telephone Encounter (Signed)
Patient calling back regarding this. Best # [phone]

## 2017-02-15 NOTE — Telephone Encounter (Signed)
Patient is asking for your input on her hiatal hernia and control of her symptoms. She has seen [name] and has had some discussion about her options. Please advise.

## 2017-02-16 ENCOUNTER — Encounter: Payer: Self-pay | Admitting: Internal Medicine

## 2017-02-16 NOTE — Telephone Encounter (Signed)
Spoke with [name]. She is taking measures to try and control her GI symptoms. She has "3 pillows now" and she is sleeping on her left side. Doesn't lay down after eating. She is trying to lose weight. She said these measure may be helping "a tiny bit."

## 2017-02-16 NOTE — Telephone Encounter (Signed)
I called and left a message that I would call back

## 2017-02-28 ENCOUNTER — Encounter (HOSPITAL_COMMUNITY): Payer: Self-pay | Admitting: Nurse Practitioner

## 2017-02-28 ENCOUNTER — Ambulatory Visit (HOSPITAL_COMMUNITY)
Admission: RE | Admit: 2017-02-28 | Discharge: 2017-02-28 | Disposition: A | Discharge status: Home / Self Care | Payer: Medicare Other | Source: Ambulatory Visit | Attending: Nurse Practitioner | Admitting: Nurse Practitioner

## 2017-02-28 VITALS — BP 140/68 | HR 73 | Ht 66.0 in | Wt 193.4 lb

## 2017-02-28 DIAGNOSIS — Z9842 Cataract extraction status, left eye: Secondary | ICD-10-CM | POA: Diagnosis not present

## 2017-02-28 DIAGNOSIS — Z833 Family history of diabetes mellitus: Secondary | ICD-10-CM | POA: Insufficient documentation

## 2017-02-28 DIAGNOSIS — E039 Hypothyroidism, unspecified: Secondary | ICD-10-CM | POA: Insufficient documentation

## 2017-02-28 DIAGNOSIS — Z880 Allergy status to penicillin: Secondary | ICD-10-CM | POA: Diagnosis not present

## 2017-02-28 DIAGNOSIS — D649 Anemia, unspecified: Secondary | ICD-10-CM | POA: Insufficient documentation

## 2017-02-28 DIAGNOSIS — Z9889 Other specified postprocedural states: Secondary | ICD-10-CM | POA: Diagnosis not present

## 2017-02-28 DIAGNOSIS — R011 Cardiac murmur, unspecified: Secondary | ICD-10-CM | POA: Diagnosis not present

## 2017-02-28 DIAGNOSIS — M1990 Unspecified osteoarthritis, unspecified site: Secondary | ICD-10-CM | POA: Insufficient documentation

## 2017-02-28 DIAGNOSIS — I481 Persistent atrial fibrillation: Secondary | ICD-10-CM | POA: Diagnosis present

## 2017-02-28 DIAGNOSIS — Z9841 Cataract extraction status, right eye: Secondary | ICD-10-CM | POA: Diagnosis not present

## 2017-02-28 DIAGNOSIS — Z853 Personal history of malignant neoplasm of breast: Secondary | ICD-10-CM | POA: Insufficient documentation

## 2017-02-28 DIAGNOSIS — K2270 Barrett's esophagus without dysplasia: Secondary | ICD-10-CM | POA: Diagnosis not present

## 2017-02-28 DIAGNOSIS — Z8541 Personal history of malignant neoplasm of cervix uteri: Secondary | ICD-10-CM | POA: Diagnosis not present

## 2017-02-28 DIAGNOSIS — E119 Type 2 diabetes mellitus without complications: Secondary | ICD-10-CM | POA: Insufficient documentation

## 2017-02-28 DIAGNOSIS — Z7901 Long term (current) use of anticoagulants: Secondary | ICD-10-CM | POA: Diagnosis not present

## 2017-02-28 DIAGNOSIS — I480 Paroxysmal atrial fibrillation: Secondary | ICD-10-CM | POA: Diagnosis not present

## 2017-02-28 DIAGNOSIS — Z7984 Long term (current) use of oral hypoglycemic drugs: Secondary | ICD-10-CM | POA: Diagnosis not present

## 2017-02-28 DIAGNOSIS — Z8249 Family history of ischemic heart disease and other diseases of the circulatory system: Secondary | ICD-10-CM | POA: Insufficient documentation

## 2017-02-28 DIAGNOSIS — K219 Gastro-esophageal reflux disease without esophagitis: Secondary | ICD-10-CM | POA: Insufficient documentation

## 2017-02-28 DIAGNOSIS — I10 Essential (primary) hypertension: Secondary | ICD-10-CM | POA: Diagnosis not present

## 2017-02-28 DIAGNOSIS — Z7902 Long term (current) use of antithrombotics/antiplatelets: Secondary | ICD-10-CM | POA: Diagnosis not present

## 2017-02-28 DIAGNOSIS — Z8042 Family history of malignant neoplasm of prostate: Secondary | ICD-10-CM | POA: Insufficient documentation

## 2017-02-28 DIAGNOSIS — Z85850 Personal history of malignant neoplasm of thyroid: Secondary | ICD-10-CM | POA: Diagnosis not present

## 2017-02-28 DIAGNOSIS — H409 Unspecified glaucoma: Secondary | ICD-10-CM | POA: Insufficient documentation

## 2017-02-28 DIAGNOSIS — G4733 Obstructive sleep apnea (adult) (pediatric): Secondary | ICD-10-CM | POA: Insufficient documentation

## 2017-02-28 DIAGNOSIS — Z800 Family history of malignant neoplasm of digestive organs: Secondary | ICD-10-CM | POA: Insufficient documentation

## 2017-02-28 DIAGNOSIS — Z90711 Acquired absence of uterus with remaining cervical stump: Secondary | ICD-10-CM | POA: Insufficient documentation

## 2017-02-28 DIAGNOSIS — R05 Cough: Secondary | ICD-10-CM | POA: Diagnosis not present

## 2017-02-28 NOTE — Progress Notes (Signed)
Primary Care Physician: Irven Shelling, MD Cardiologist: [name] Referral provider: Curly [place], B.,PA Pulmonologist: Dr. Elsworth Soho GI: Dr. Dolphus Jenny [name] is a 83 y.o. female with a h/o afib that has been on amiodarone since June of 2015. She has had recent afib in the setting of persistent cough x 6-8 weeks  that has not responded to two rounds of prednisone and antibiotics. She has also been using cough syrup with decongestants. Her Cardizem was increased and as of 2/14, BB was added with good slowing of v rate and with slower heart rate, she does feel better but persists in afib. She is not really wanting a cardioversion. Her biggest concern is this cough. She did have a CXR and other than a large hiatal hernia, there were no other abnormalities noted. TSH has also been mildly elevated but PCP aware and is monitoring. Therefore, increasing amiodarone has not been attempted.Continues on eliquis.  Returns to afib clinic 2/23. She has been to see pulmonology and the etiology of the cough was felt not to be amiodarone but a large hiatal hernia. She is pending 2 more pulmonology tests. She is now ready to be cardioverted and will schedule this next week. She has not missed any dose of DOAC.   F/u afib clinic 3/6, she had successful cardioversion but had bradycardia associated with weakness and mild confusion when returned to SR and stopped her BB. She is in SR today with v rate of 74 bpm. She continues Cardizem and amiodarone. She feels improved.  F/u in afib clinic 4/18, pt has continued in SR.She has seen pulmonary for her cough which is due to large HH/reflux.  PFT's done, no interstitial disease and is ok to continue with amiodarone. She also has been seen by GI in the interim and is pending f/u with Dr. Carlean Purl. Overall, she is feeling at her baseline.  Today, she denies symptoms of chest pain, shortness of breath, orthopnea, PND, lower extremity edema, dizziness, presyncope, syncope,  or neurologic sequela.Perisistent cough. The patient is tolerating medications without difficulties and is otherwise without complaint today.   Past Medical History:  Diagnosis Date  . Allergy    SEASONAL  . Anemia    years ago  . Arthritis    "all my joints" (03/31/2014)  . Atrial fibrillation ([place])   . Barrett's esophagus   . Breast cancer [facility])    s/p right breast lumpectomy  . Cervical cancer ([place])   . DJD (degenerative joint disease) of knee   . Dysrhythmia    Afib  . GERD (gastroesophageal reflux disease)   . Glaucoma   . H/O hiatal hernia   . Heart murmur   . Hx of transesophageal echocardiography (TEE) for monitoring    TEE (03/2014): No LAA clot, moderate LAE, core triatriatum type structure in RA with no stenosis, normal EF 55-60%, mild MR  . Hypertension   . Hypothyroidism, postsurgical   . Migraine    "last one was years ago" (03/31/2014)  . OSA on CPAP   . PONV (postoperative nausea and vomiting)   . Sleep apnea   . Thyroid cancer [facility])    s/p thyroidectomy  . Type II diabetes mellitus ([place])    Past Surgical History:  Procedure Laterality Date  . ABDOMINAL HYSTERECTOMY     "partial"  . BREAST BIOPSY Right   . BREAST LUMPECTOMY Right   . CARDIOVERSION N/A 03/30/2014   Procedure: CARDIOVERSION - BEDSIDE;  Surgeon: [name], MD;  Location:  [facility];  Service: Cardiovascular;  Laterality: N/A;  . CARDIOVERSION N/A 03/31/2014   Procedure: CARDIOVERSION  (BEDSIDE) ;  Surgeon: Lelon Perla, MD;  Location: [place];  Service: Cardiovascular;  Laterality: N/A;  . CARDIOVERSION N/A 04/27/2014   Procedure: CARDIOVERSION;  Surgeon: Darlin Coco, MD;  Location: [facility] ENDOSCOPY;  Service: Cardiovascular;  Laterality: N/A;  . CARDIOVERSION N/A 04/13/2015   Procedure: CARDIOVERSION;  Surgeon: Thayer Headings, MD;  Location: [place];  Service: Cardiovascular;  Laterality: N/A;  . CARDIOVERSION N/A 01/08/2017   Procedure: CARDIOVERSION;  Surgeon: Sanda Klein, MD;   Location: [facility] ENDOSCOPY;  Service: Cardiovascular;  Laterality: N/A;  . CARPAL TUNNEL RELEASE Right   . CATARACT EXTRACTION W/ INTRAOCULAR LENS  IMPLANT, BILATERAL Bilateral   . COLONOSCOPY    . EXCISIONAL HEMORRHOIDECTOMY    . POLYPECTOMY    . TEE WITHOUT CARDIOVERSION N/A 03/30/2014   Procedure: TRANSESOPHAGEAL ECHOCARDIOGRAM (TEE);  Surgeon: [name], MD;  Location: [facility] ENDOSCOPY;  Service: Cardiovascular;  Laterality: N/A;  . THYROIDECTOMY    . TONSILLECTOMY      Current Outpatient Prescriptions  Medication Sig Dispense Refill  . amiodarone (PACERONE) 200 MG tablet TAKE 1 TABLET BY MOUTH  DAILY 90 tablet 2  . bimatoprost (LUMIGAN) 0.01 % SOLN Place 1 drop into both eyes at bedtime. Eye drop    . calcium citrate-vitamin D (CITRACAL+D) 315-200 MG-UNIT tablet Take 1 tablet by mouth 2 (two) times daily.     . Cyanocobalamin (B-12 PO) Take 1 tablet by mouth daily as needed (for energy).     Marland Kitchen diltiazem (CARDIZEM CD) 360 MG 24 hr capsule Take 360 mg by mouth daily.    Marland Kitchen ELIQUIS 5 MG TABS tablet TAKE 1 TABLET BY MOUTH TWO  TIMES DAILY 180 tablet 3  . FeFum-FePo-FA-B Cmp-C-Zn-Mn-Cu (TANDEM PLUS) 162-115.2-1 MG CAPS Take 1 tablet by mouth daily as needed (low iron).     . fluticasone (FLONASE) 50 MCG/ACT nasal spray Place 1 spray into both nostrils daily. Reported on 02/25/2016    . lansoprazole (PREVACID) 30 MG capsule Take 1 capsule (30 mg total) by mouth 2 (two) times daily before a meal. 180 capsule 2  . levothyroxine (SYNTHROID, LEVOTHROID) 175 MCG tablet Take 175 mcg by mouth daily before breakfast.    . LORazepam (ATIVAN) 1 MG tablet Take 1.5 mg by mouth at bedtime.    . Lutein 6 MG CAPS Take 6 mg by mouth 2 (two) times daily as needed (for vegetable support).    . Magnesium 250 MG TABS Take 2 tablets by mouth.    . metFORMIN (GLUMETZA) 500 MG (MOD) 24 hr tablet Take 500 mg by mouth 2 (two) times daily with a meal.     . multivitamin-lutein (OCUVITE-LUTEIN) CAPS capsule Take 1 capsule  by mouth daily.    . ONE TOUCH ULTRA TEST test strip Use as directed    . ONETOUCH DELICA LANCETS 01S MISC Use as directed    . potassium chloride SA (K-DUR,KLOR-CON) 20 MEQ tablet TAKE 3 TABLETS BY MOUTH  DAILY 270 tablet 2  . rosuvastatin (CRESTOR) 20 MG tablet Take 10 mg by mouth at bedtime.    . sertraline (ZOLOFT) 50 MG tablet Take 50 mg by mouth daily.    Marland Kitchen acetaminophen (TYLENOL) 650 MG CR tablet Take 1,300 mg by mouth every 8 (eight) hours.     No current facility-administered medications for this encounter.     Allergies  Allergen Reactions  . Penicillins Itching and Rash  Has patient had a PCN reaction causing immediate rash, facial/tongue/throat swelling, SOB or lightheadedness with hypotension: Unknown, childhood reaction Has patient had a PCN reaction causing severe rash involving mucus membranes or skin necrosis: No Has patient had a PCN reaction that required hospitalization No Has patient had a PCN reaction occurring within the last 10 years: No If all of the above answers are "NO", then may proceed with Cephalosporin use.     Social History   Social History  . Marital status: Divorced    Spouse name: N/A  . Number of children: 2  . Years of education: N/A   Occupational History  . Retired    Social History Main Topics  . Smoking status: Never Smoker  . Smokeless tobacco: Never Used  . Alcohol use No  . Drug use: No  . Sexual activity: No   Other Topics Concern  . Not on file   Social History Narrative  . No narrative on file    Family History  Problem Relation Age of Onset  . Prostate cancer Father   . Diabetes Sister   . Heart disease Sister   . Heart attack Brother   . Heart disease Brother   . Diabetes Brother   . Diabetes Sister   . Diabetes Sister   . Pancreatic cancer Sister   . Diabetes Brother   . Heart disease Brother   . Diabetes Brother   . Heart disease Brother   . Diabetes Brother   . Heart disease Brother   . Diabetes  Brother   . Heart disease Brother   . Diabetes Brother   . Heart disease Brother   . Diabetes Brother     ROS- All systems are reviewed and negative except as per the HPI above  Physical Exam: Vitals:   02/28/17 1406  BP: 140/68  Pulse: 73  Weight: 193 lb 6.4 oz (87.7 kg)  Height: 5\' 6"  (1.676 m)   Wt Readings from Last 3 Encounters:  02/28/17 193 lb 6.4 oz (87.7 kg)  02/02/17 192 lb 12.8 oz (87.5 kg)  01/30/17 190 lb (86.2 kg)    Labs: Lab Results  Component Value Date   NA 140 01/05/2017   K 4.5 01/05/2017   CL 107 01/05/2017   CO2 26 01/05/2017   GLUCOSE 133 (H) 01/05/2017   BUN 14 01/05/2017   CREATININE 0.89 01/05/2017   CALCIUM 9.5 01/05/2017   MG 1.8 03/28/2014   Lab Results  Component Value Date   INR 1.12 04/20/2016   No results found for: CHOL, HDL, LDLCALC, TRIG   GEN- The patient is well appearing, alert and oriented x 3 today.   Head- normocephalic, atraumatic Eyes-  Sclera clear, conjunctiva pink Ears- hearing intact Oropharynx- clear Neck- supple, no JVP Lymph- no cervical lymphadenopathy Lungs- Clear to ausculation bilaterally, normal work of breathing Heart- regular rate and rhythm, no murmurs, rubs or gallops, PMI not laterally displaced GI- soft, NT, ND, + BS Extremities- no clubbing, cyanosis, or edema MS- no significant deformity or atrophy Skin- no rash or lesion Psych- euthymic mood, full affect Neuro- strength and sensation are intact  EKG- NSR at 73 bpm, pr int 186 ms, qrs int 102 ms, qtc 460 ms Epic records reviewed Cxr-IMPRESSION: There is a large hiatal hernia with worsening gaseous distention. The hernia measures at least 9.8 x 9.4 cm. Moderate gas noted within esophagus. No infiltrate or pulmonary edema. Surgical clips are noted in right axilla.    Assessment and Plan:  1. Persistent afib No further issues with afib Successful cardioversion, but with bradycardia,  improved off BB She has returned to her exercise  bike Continue amiodarone 200 mg qd Continue cardizem 360 mg qd Continue eliquis 5 mg bid  2. Persistent cough Dr. Elsworth Soho believes cough is 2/2 hiatal hernia, Jerrye Bushy and can continue amiodarone Seen by GI as well    F/u with [name] 6/8 afib clinic as needed  Butch Penny C. Ewell Benassi, [facility] [address] [place], [place] [address] [phone]

## 2017-03-01 ENCOUNTER — Telehealth: Payer: Self-pay | Admitting: Nurse Practitioner

## 2017-03-01 ENCOUNTER — Encounter: Payer: Self-pay | Admitting: Internal Medicine

## 2017-03-01 NOTE — Telephone Encounter (Signed)
Records printed to fax for the referral.

## 2017-03-01 NOTE — Telephone Encounter (Signed)
I spoke to the patient.  She has a very large hiatal hernia and the only thing I think that would help her would be surgical correction.  She is elderly and has co-morbidities but had spine surgery last year and remaions active so I think we should consider.  Please refer to Dr. Kaylyn Lim of [facility] - large symptomatic hiatal hernia - I would think he can see her without a manometry and pH study we are not fixing GERD - she has half of her stomach in chest.  Note to [facility] that has had an upper GI series and chest CT

## 2017-03-09 ENCOUNTER — Ambulatory Visit (INDEPENDENT_AMBULATORY_CARE_PROVIDER_SITE_OTHER): Payer: Medicare Other

## 2017-03-09 ENCOUNTER — Ambulatory Visit (INDEPENDENT_AMBULATORY_CARE_PROVIDER_SITE_OTHER): Payer: Medicare Other | Admitting: Orthopaedic Surgery

## 2017-03-09 ENCOUNTER — Encounter (INDEPENDENT_AMBULATORY_CARE_PROVIDER_SITE_OTHER): Payer: Self-pay | Admitting: Orthopaedic Surgery

## 2017-03-09 VITALS — BP 124/76 | HR 76 | Ht 66.0 in | Wt 189.0 lb

## 2017-03-09 DIAGNOSIS — S8002XA Contusion of left knee, initial encounter: Secondary | ICD-10-CM

## 2017-03-09 DIAGNOSIS — M79662 Pain in left lower leg: Secondary | ICD-10-CM

## 2017-03-09 DIAGNOSIS — S8001XA Contusion of right knee, initial encounter: Secondary | ICD-10-CM | POA: Diagnosis not present

## 2017-03-09 DIAGNOSIS — M25562 Pain in left knee: Secondary | ICD-10-CM

## 2017-03-09 DIAGNOSIS — M25561 Pain in right knee: Secondary | ICD-10-CM | POA: Diagnosis not present

## 2017-03-09 NOTE — Progress Notes (Signed)
Office Visit Note   Patient: [name]           Date of Birth: [birth year]           MRN: [id number] Visit Date: 03/09/2017              Requested by: Lavone Orn, MD [address] [place], [place] [address] PCP: Irven Shelling, MD   Assessment & Plan: Visit Diagnoses:  1. Acute pain of left knee   2. Acute pain of right knee   3. Pain of left lower leg   4. Contusion of left knee, initial encounter   5. Contusion of right knee, initial encounter     Plan: Reviewed x-rays with Dr. Lorin Mercy. Patient will continue conservative treatment. Continues ice off and on as needed. She does have a walker at home and I encouraged her to use this. Follow-up in 2 weeks for recheck. If 1 or both knees continue to be symptomatically may consider trying  intra-articular Marcaine/Depo-Medrol injections. All questions answered  Follow-Up Instructions: Return in about 2 weeks (around 03/23/2017).   Orders:  Orders Placed This Encounter  Procedures  . XR Knee 1-2 Views Left  . XR Knee 1-2 Views Right  . XR Tibia/Fibula Left   No orders of the defined types were placed in this encounter.     Procedures: No procedures performed   Clinical Data: No additional findings.   Subjective: Chief Complaint  Patient presents with  . Left Knee - Pain  . Right Knee - Pain  . Left Leg - Pain    HPI Patient comes in today with complaints of bilateral knee pain. States that left is worse than the right. She fell a few days ago and [place] she tripped on a step landing directly onto her knees. She was able to ablate after the incident. Began to have some soreness the next day. Patient is on Eliquis for history of atrial fibrillation. She did not lose consciousness before or after the fall. She does have some bruising bilateral knees but this is improving. Left knee pain mostly lateral aspect and right knee anterior patella. Pain with ambulating. Review of Systems    Constitutional: Negative.   HENT: Negative.   Genitourinary: Negative.   Neurological: Negative.      Objective: Vital Signs: BP 124/76   Pulse 76   Ht 5\' 6"  (1.676 m)   Wt 189 lb (85.7 kg)   BMI 30.51 kg/m   Physical Exam  Constitutional: She is oriented to person, place, and time.  Musculoskeletal:  Gait is somewhat unsteady with a single-prong cane. Bilateral knee shows good range of motion. She does have some swelling and bruising bilateral knees. Left knee she is mostly tender around the lateral joint line. Right knee positive patellofemoral crepitus. Positive patellar grind. Left knee also patellofemoral crepitus. Bilateral calves nontender. Neurovascular intact. Skin order. She does have bilateral lateral tracking patellas.  Neurological: She is alert and oriented to person, place, and time.  Skin: Skin is warm and dry.  Psychiatric: She has a normal mood and affect.    Ortho Exam  Specialty Comments:  No specialty comments available.  Imaging: No results found.   PMFS History: Patient Active Problem List   Diagnosis Date Noted  . Persistent atrial fibrillation ([place])   . Chronic cough 01/02/2017  . Lumbar stenosis with neurogenic claudication 04/20/2016  . 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 Malignant neoplasm of breast ([place]) 02/25/2016  . 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 Atrial fibrillation with RVR ([place]) 03/31/2015  . Post-surgical hypothyroidism 04/09/2014  . Anticoagulated by anticoagulation treatment - Eliquis 03/30/2014  . Diabetes mellitus without complication ([place])   . Hypertension   . PAF- s/p DCCV  03/28/2014  . Thyroid cancer ([place])   . Heart murmur   . DJD (degenerative joint disease) of knee   . Glaucoma   . History of laser assisted in situ keratomileusis 07/25/2012  . Degenerative disorder of eye 07/25/2012  . Exotropia, intermittent 07/09/2012  . 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 Error, refractive, myopia  07/09/2012  . ANEMIA, IRON DEFICIENCY 10/14/2010  . GERD 10/14/2010  . ADENOCARCINOMA, BREAST 10/13/2010  . THYROID CANCER 10/13/2010  . DM 10/13/2010  . HYPERCHOLESTEROLEMIA 10/13/2010  . ANEMIA, UNSPECIFIED 10/13/2010  . ANXIETY 10/13/2010  . MIGRAINE HEADACHE 10/13/2010  . HYPERTENSION 10/13/2010  . EROSIVE ESOPHAGITIS 10/13/2010  . BARRETTS ESOPHAGUS 10/13/2010  . Diaphragmatic hernia 10/13/2010  . DIVERTICULOSIS, COLON 10/13/2010  . IRRITABLE BOWEL SYNDROME 10/13/2010  . RECTAL FISSURE 10/13/2010  . OSTEOARTHRITIS 10/13/2010  . CARDIAC MURMUR 10/13/2010  . COLONIC POLYPS, ADENOMATOUS, HX OF 10/13/2010   Past Medical History:  Diagnosis Date  . Allergy    SEASONAL  . Anemia    years ago  . Arthritis    "all my joints" (03/31/2014)  . Atrial fibrillation ([place])   . Barrett's esophagus   . Breast cancer [facility])    s/p right breast lumpectomy  . Cervical cancer ([place])   . DJD (degenerative joint disease) of knee   . Dysrhythmia    Afib  . GERD (gastroesophageal reflux disease)   . Glaucoma   . H/O hiatal hernia   . Heart murmur   . Hx of transesophageal echocardiography (TEE) for monitoring    TEE (03/2014): No LAA clot, moderate LAE, core triatriatum type structure in RA with no stenosis, normal EF 55-60%, mild MR  . Hypertension   . Hypothyroidism, postsurgical   . Migraine    "last one was years ago" (03/31/2014)  . OSA on CPAP   . PONV (postoperative nausea and vomiting)   . Sleep apnea   . Thyroid cancer [facility])    s/p thyroidectomy  . Type II diabetes mellitus ([facility])     Family History  Problem Relation Age of Onset  . Prostate cancer Father   . Diabetes Sister   . Heart disease Sister   . Heart attack Brother   . Heart disease Brother   . Diabetes Brother   . Diabetes Sister   . Diabetes Sister   . Pancreatic cancer Sister   . Diabetes Brother   . Heart disease Brother   . Diabetes Brother   . Heart disease Brother   . Diabetes Brother   . Heart  disease Brother   . Diabetes Brother   . Heart disease Brother   . Diabetes Brother   . Heart disease Brother   . Diabetes Brother     Past Surgical History:  Procedure Laterality Date  . ABDOMINAL HYSTERECTOMY     "partial"  . BREAST BIOPSY Right   . BREAST LUMPECTOMY Right   . CARDIOVERSION N/A 03/30/2014   Procedure: CARDIOVERSION - BEDSIDE;  Surgeon: [name], MD;  Location: [facility];  Service: Cardiovascular;  Laterality: N/A;  . CARDIOVERSION N/A 03/31/2014   Procedure: CARDIOVERSION  (BEDSIDE) ;  Surgeon: Lelon Perla, MD;  Location: [facility];  Service: Cardiovascular;  Laterality: N/A;  . CARDIOVERSION  N/A 04/27/2014   Procedure: CARDIOVERSION;  Surgeon: Darlin Coco, MD;  Location[facility] ENDOSCOPY;  Service: Cardiovascular;  Laterality: N/A;  . CARDIOVERSION N/A 04/13/2015   Procedure: CARDIOVERSION;  Surgeon: Thayer Headings, MD;  Location: [place];  Service: Cardiovascular;  Laterality: N/A;  . CARDIOVERSION N/A 01/08/2017   Procedure: CARDIOVERSION;  Surgeon: Sanda Klein, MD;  Location: [facility] ENDOSCOPY;  Service: Cardiovascular;  Laterality: N/A;  . CARPAL TUNNEL RELEASE Right   . CATARACT EXTRACTION W/ INTRAOCULAR LENS  IMPLANT, BILATERAL Bilateral   . COLONOSCOPY    . EXCISIONAL HEMORRHOIDECTOMY    . TEE WITHOUT CARDIOVERSION N/A 03/30/2014   Procedure: TRANSESOPHAGEAL ECHOCARDIOGRAM (TEE);  Surgeon: [name], MD;  Location: [facility] ENDOSCOPY;  Service: Cardiovascular;  Laterality: N/A;  . THYROIDECTOMY    . TONSILLECTOMY     Social History   Occupational History  . Retired    Social History Main Topics  . Smoking status: Never Smoker  . Smokeless tobacco: Never Used  . Alcohol use No  . Drug use: No  . Sexual activity: No

## 2017-03-14 ENCOUNTER — Encounter: Payer: Self-pay | Admitting: Pulmonary Disease

## 2017-03-14 ENCOUNTER — Ambulatory Visit: Payer: Medicare Other | Admitting: Pulmonary Disease

## 2017-03-14 ENCOUNTER — Ambulatory Visit (INDEPENDENT_AMBULATORY_CARE_PROVIDER_SITE_OTHER): Payer: Medicare Other | Admitting: Pulmonary Disease

## 2017-03-14 DIAGNOSIS — R05 Cough: Secondary | ICD-10-CM

## 2017-03-14 DIAGNOSIS — R053 Chronic cough: Secondary | ICD-10-CM

## 2017-03-14 NOTE — Patient Instructions (Signed)
Stay on Prevacid twice daily Use Delsym as needed for cough. Repeat DLCO test in 6 months to test for effect of amiodarone the lungs

## 2017-03-14 NOTE — Telephone Encounter (Signed)
She is scheduled to see Dr Rosendo Gros 04/26/17.

## 2017-03-14 NOTE — Assessment & Plan Note (Signed)
Stay on Prevacid twice daily Use Delsym as needed for cough.  She is considering hiatal hernia surgery and will undergo surgical evaluation in June

## 2017-03-14 NOTE — Assessment & Plan Note (Signed)
Repeat DLCO test in 6 months to test for effect of amiodarone the lungs Consider decreasing to lowest required dose in the meantime

## 2017-03-14 NOTE — Progress Notes (Signed)
   Subjective:    Patient ID: [name], female    DOB: 1931/08/17, 83 y.o.   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 She has known moderate hiatal hernia, demonstrated on her chest x-ray 09/2016 which shows at least 10 cm herniated stomach into the chest.   She reports  breakthrough symptoms of reflux on Prevacid.  She denies postnasal drip Or wheezing or sputum production. Delsym as provided her some relief. She continues on amiodarone for atrial fibrillation. PFT showed drop in DLCO compared to 2015 but CT chest did not show any evidence of interstitial lung disease  Significant tests/ events reviewed 01/2017 FEV1 88%, ratio 72, FVC 91%, total lung capacity 96%, DLCO 52%.  DLCO has dropped considerably since 2015 (was 100%)   01/2017 HRCT chest - no ILD , Large hiatal hernia with compressive atelectasis in the medial right lower lobe.   Review of Systems Patient denies significant dyspnea,cough, hemoptysis,  chest pain, palpitations, pedal edema, orthopnea, paroxysmal nocturnal dyspnea, lightheadedness, nausea, vomiting, abdominal or  leg pains      Objective:   Physical Exam  Gen. Pleasant, well-nourished, in no distress ENT - no thrush, no post nasal drip Neck: No JVD, no thyromegaly, no carotid bruits Lungs: no use of accessory muscles, no dullness to percussion, clear without rales or rhonchi  Cardiovascular: Rhythm regular, heart sounds  normal, no murmurs or gallops, no peripheral edema Musculoskeletal: No deformities, no cyanosis or clubbing        Assessment & Plan:

## 2017-03-29 ENCOUNTER — Ambulatory Visit (INDEPENDENT_AMBULATORY_CARE_PROVIDER_SITE_OTHER): Payer: Medicare Other | Admitting: Orthopedic Surgery

## 2017-03-30 ENCOUNTER — Encounter: Payer: Self-pay | Admitting: *Deleted

## 2017-04-19 ENCOUNTER — Ambulatory Visit (INDEPENDENT_AMBULATORY_CARE_PROVIDER_SITE_OTHER): Payer: Medicare Other | Admitting: Cardiovascular Disease

## 2017-04-19 ENCOUNTER — Encounter: Payer: Self-pay | Admitting: Cardiovascular Disease

## 2017-04-19 VITALS — BP 110/66 | HR 66 | Ht 66.0 in | Wt 188.8 lb

## 2017-04-19 DIAGNOSIS — I480 Paroxysmal atrial fibrillation: Secondary | ICD-10-CM | POA: Diagnosis not present

## 2017-04-19 DIAGNOSIS — Z79899 Other long term (current) drug therapy: Secondary | ICD-10-CM | POA: Diagnosis not present

## 2017-04-19 NOTE — Progress Notes (Signed)
Cardiology Office Note Date:  04/19/2017   ID:  [name], DOB July 05, 1931, MRN [id number]  PCP:  Lavone Orn, MD  Cardiologist:  Sherren Mocha, MD    Chief Complaint  Patient presents with  . Atrial Fibrillation     History of Present Illness: [name] is a 83 y.o. female who presents for follow-up of atrial fibrillation. She's had multiple cardioversions and has been treated with amiodarone and cardizem, anticoagulated with apixaban.   She has been diagnosed with a large hiatal hernia and plans on having laparoscopic surgery pending further surgical assessment. She's having trouble swallowing at times.   Denies recent palpitations, chest pain, or shortness of breath. No edema, lightheadedness, or syncope. She's compliant with her medications.   Past Medical History:  Diagnosis Date  . Allergy    SEASONAL  . Anemia    years ago  . Arthritis    "all my joints" (03/31/2014)  . Atrial fibrillation ([place])   . Barrett's esophagus   . Breast cancer [facility])    s/p right breast lumpectomy  . Cervical cancer ([place])   . DJD (degenerative joint disease) of knee   . Dysrhythmia    Afib  . GERD (gastroesophageal reflux disease)   . Glaucoma   . H/O hiatal hernia   . Heart murmur   . Hx of transesophageal echocardiography (TEE) for monitoring    TEE (03/2014): No LAA clot, moderate LAE, core triatriatum type structure in RA with no stenosis, normal EF 55-60%, mild MR  . Hypertension   . Hypothyroidism, postsurgical   . Migraine    "last one was years ago" (03/31/2014)  . OSA on CPAP   . PONV (postoperative nausea and vomiting)   . Sleep apnea   . Thyroid cancer [facility])    s/p thyroidectomy  . Type II diabetes mellitus ([place])     Past Surgical History:  Procedure Laterality Date  . ABDOMINAL HYSTERECTOMY     "partial"  . BREAST BIOPSY Right   . BREAST LUMPECTOMY Right   . CARDIOVERSION N/A 03/30/2014   Procedure: CARDIOVERSION - BEDSIDE;  Surgeon: [name], MD;  Location: [place];  Service: Cardiovascular;  Laterality: N/A;  . CARDIOVERSION N/A 03/31/2014   Procedure: CARDIOVERSION  (BEDSIDE) ;  Surgeon: Lelon Perla, MD;  Location: [place];  Service: Cardiovascular;  Laterality: N/A;  . CARDIOVERSION N/A 04/27/2014   Procedure: CARDIOVERSION;  Surgeon: Darlin Coco, MD;  Location: [facility] ENDOSCOPY;  Service: Cardiovascular;  Laterality: N/A;  . CARDIOVERSION N/A 04/13/2015   Procedure: CARDIOVERSION;  Surgeon: Thayer Headings, MD;  Location: [place];  Service: Cardiovascular;  Laterality: N/A;  . CARDIOVERSION N/A 01/08/2017   Procedure: CARDIOVERSION;  Surgeon: Sanda Klein, MD;  Location: [facility] ENDOSCOPY;  Service: Cardiovascular;  Laterality: N/A;  . CARPAL TUNNEL RELEASE Right   . CATARACT EXTRACTION W/ INTRAOCULAR LENS  IMPLANT, BILATERAL Bilateral   . COLONOSCOPY    . EXCISIONAL HEMORRHOIDECTOMY    . TEE WITHOUT CARDIOVERSION N/A 03/30/2014   Procedure: TRANSESOPHAGEAL ECHOCARDIOGRAM (TEE);  Surgeon: [name], MD;  Location: [facility] ENDOSCOPY;  Service: Cardiovascular;  Laterality: N/A;  . THYROIDECTOMY    . TONSILLECTOMY      Current Outpatient Prescriptions  Medication Sig Dispense Refill  . acetaminophen (TYLENOL) 650 MG CR tablet Take 1,300 mg by mouth every 8 (eight) hours.    Marland Kitchen amiodarone (PACERONE) 200 MG tablet TAKE 1 TABLET BY MOUTH  DAILY 90 tablet 2  . bimatoprost (LUMIGAN)  0.01 % SOLN Place 1 drop into both eyes at bedtime. Eye drop    . calcium citrate-vitamin D (CITRACAL+D) 315-200 MG-UNIT tablet Take 1 tablet by mouth 2 (two) times daily.     . Cyanocobalamin (B-12 PO) Take 1 tablet by mouth daily as needed (for energy).     Marland Kitchen diltiazem (CARDIZEM CD) 360 MG 24 hr capsule Take 360 mg by mouth daily.    Marland Kitchen ELIQUIS 5 MG TABS tablet TAKE 1 TABLET BY MOUTH TWO  TIMES DAILY 180 tablet 3  . FeFum-FePo-FA-B Cmp-C-Zn-Mn-Cu (TANDEM PLUS) 162-115.2-1 MG CAPS Take 1 tablet by mouth daily as needed (low iron).     .  fluticasone (FLONASE) 50 MCG/ACT nasal spray Place 1 spray into both nostrils daily. Reported on 02/25/2016    . lansoprazole (PREVACID) 30 MG capsule Take 1 capsule (30 mg total) by mouth 2 (two) times daily before a meal. 180 capsule 2  . levothyroxine (SYNTHROID, LEVOTHROID) 175 MCG tablet Take 175 mcg by mouth daily before breakfast.    . LORazepam (ATIVAN) 1 MG tablet Take 1.5 mg by mouth at bedtime.    . Lutein 6 MG CAPS Take 6 mg by mouth 2 (two) times daily as needed (for vegetable support).    . Magnesium 250 MG TABS Take 2 tablets by mouth.    . metFORMIN (GLUMETZA) 500 MG (MOD) 24 hr tablet Take 500 mg by mouth 2 (two) times daily with a meal.     . multivitamin-lutein (OCUVITE-LUTEIN) CAPS capsule Take 1 capsule by mouth daily.    . ONE TOUCH ULTRA TEST test strip Use as directed    . ONETOUCH DELICA LANCETS 93Z MISC Use as directed    . potassium chloride SA (K-DUR,KLOR-CON) 20 MEQ tablet TAKE 3 TABLETS BY MOUTH  DAILY 270 tablet 2  . rosuvastatin (CRESTOR) 20 MG tablet Take 10 mg by mouth at bedtime.    . sertraline (ZOLOFT) 50 MG tablet Take 50 mg by mouth daily.     No current facility-administered medications for this visit.     Allergies:   Penicillins   Social History:  The patient  reports that she has never smoked. She has never used smokeless tobacco. She reports that she does not drink alcohol or use drugs.   Family History:  The patient's family history includes Diabetes in her brother, brother, brother, brother, brother, brother, brother, sister, sister, and sister; Heart attack in her brother; Heart disease in her brother, brother, brother, brother, brother, brother, and sister; Pancreatic cancer in her sister; Prostate cancer in her father.   ROS:  Please see the history of present illness.  All other systems are reviewed and negative.   PHYSICAL EXAM: VS:  BP 110/66   Pulse 66   Ht 5' 6"  (1.676 m)   Wt 188 lb 12.8 oz (85.6 kg)   SpO2 96%   BMI 30.47 kg/m  ,  BMI Body mass index is 30.47 kg/m. GEN: Well nourished, well developed, pleasant elderly woman in no acute distress  HEENT: normal  Neck: no JVD, no masses. No carotid bruits Cardiac: RRR with 2/6 systolic murmur at the LSB       Respiratory:  clear to auscultation bilaterally, normal work of breathing GI: soft, nontender, nondistended, + BS MS: no deformity or atrophy  Ext: no pretibial edema, pedal pulses 2+= bilaterally Skin: warm and dry, no rash Neuro:  Strength and sensation are intact Psych: euthymic mood, full affect  EKG:  EKG is not  ordered today.  Recent Labs: 11/01/2016: ALT 14 12/27/2016: TSH 4.710 01/05/2017: BUN 14; Creatinine, Ser 0.89; Hemoglobin 10.7; Platelets 205; Potassium 4.5; Sodium 140   Lipid Panel  No results found for: CHOL, TRIG, HDL, CHOLHDL, VLDL, LDLCALC, LDLDIRECT    Wt Readings from Last 3 Encounters:  04/19/17 188 lb 12.8 oz (85.6 kg)  03/14/17 187 lb (84.8 kg)  03/09/17 189 lb (85.7 kg)    Cardiac Studies Reviewed: Cardiac Cath 04-27-2015: Study Conclusions  - Left ventricle: The cavity size was normal. There was severe   focal basal and moderate concentric hypertrophy of the left   ventricle. Systolic function was vigorous. The estimated ejection   fraction was in the range of 65% to 70%. Wall motion was normal;   there were no regional wall motion abnormalities. Features are   consistent with a pseudonormal left ventricular filling pattern,   with concomitant abnormal relaxation and increased filling   pressure (grade 2 diastolic dysfunction). Doppler parameters are   consistent with elevated ventricular end-diastolic filling   pressure. - Aortic valve: There was mild regurgitation. - Aortic root: The aortic root was normal in size. - Left atrium: The atrium was moderately dilated. - Right ventricle: Systolic function was normal. - Right atrium: The atrium was normal in size. - Tricuspid valve: There was mild regurgitation. -  Pulmonic valve: There was no regurgitation. - Pulmonary arteries: Systolic pressure was within the normal   range. PA peak pressure: 31 mm Hg (S). - Inferior vena cava: The vessel was normal in size. - Pericardium, extracardiac: There was no pericardial effusion.  ASSESSMENT AND PLAN: 1.  Paroxysmal atrial fibrillation: maintaining sinus rhythm on amiodarone. She wants to reduce her amiodarone dose to minimize long-term side effects. I advised her to stay on 200 mg daily until she gets through surgery, then we can slowly reduce her dose to 100 mg daily once she recovers from surgery. We gave her specific instructions to slowly reduce amiodarone over a few months. She will continue on Eliquis for anticoagulation.   2. HTN: BP controlled on current Rx. No changes made today.  3. Hyperlipidemia: treated with rosuvastatin  4. Type II DM: followed by PCP, treated with oral hypoglycemics.   Current medicines are reviewed with the patient today.  The patient does not have concerns regarding medicines.  Labs/ tests ordered today include:   Orders Placed This Encounter  Procedures  . Comp Met (CMET)  . TSH  . T4, free    Disposition:   FU 6 months  Signed, Sherren Mocha, MD  04/19/2017 5:25 PM    [facility], [place], [place]  [address] Phone: [phone]; Fax: [phone]

## 2017-04-19 NOTE — Patient Instructions (Addendum)
Medication Instructions:  Your physician recommends that you continue on your current medications as directed. Please refer to the Current Medication list given to you today.  After your hiatal hernia surgery you can wean off of Amiodarone: 1st month take 200mg  daily except for 2 days per week take 100mg  2nd month take 200mg  daily except for 4 days per week take 100mg   3rd month decrease Amiodarone to 100mg  take one tablet by mouth daily  Labwork: Your physician recommends that you return for lab work in: 6 MONTHS (CMP, TSH, Free T4)  Testing/Procedures: No new orders.   Follow-Up: Your physician wants you to follow-up in: 6 MONTHS with Dr Burt Knack. You will receive a reminder letter in the mail two months in advance. If you don't receive a letter, please call our office to schedule the follow-up appointment.   Any Other Special Instructions Will Be Listed Below (If Applicable).     If you need a refill on your cardiac medications before your next appointment, please call your pharmacy.

## 2017-04-20 ENCOUNTER — Ambulatory Visit: Payer: Medicare Other | Admitting: Cardiovascular Disease

## 2017-04-26 ENCOUNTER — Ambulatory Visit: Payer: Self-pay | Admitting: General Surgery

## 2017-04-26 NOTE — H&P (Signed)
History of Present Illness [name]; 04/26/2017 11:43 AM) The patient is a 83 year old female who presents with a hiatal hernia. The patient is a 83 year old female who is referred by [name] for evaluation of a large hiatal hernia. Patient states that she's had a long history of A. fib. She is currently on Eliquis. She sees [name].  Patient states that she's had some dysphagia as well as reflux. Patient was diagnosed with a however hernia large hiatal hernia on upper GI. Patient also has CT scan which revealed a large approximate 5 cm hiatal hernia with hernia sac on the right side. Patient states that the dysphagia occurs with both solids and liquids. She does state that she is lost prostate 5 pounds recently and feels that her dysphagia and reflux is less burdensome. She denies any water brash, emesis after eating, bloating, all other ROS negative.  Of note the patient states that she's had a previous hysterectomy with a lower midline incision.   Past Surgical History [name], [place]; 04/26/2017 11:16 AM) Breast Biopsy  Right. Breast Mass; Local Excision  Right. Cataract Surgery  Bilateral. Hysterectomy (due to cancer) - Partial  Knee Surgery  Left. Oral Surgery  Spinal Surgery - Lower Back  Thyroid Surgery  Tonsillectomy   Diagnostic Studies History [name], [place]; 04/26/2017 11:16 AM) Mammogram  1-3 years ago Pap Smear  >5 years ago  Allergies [name], RMA; 04/26/2017 11:18 AM) Penicillins  Itching, Rash.  Social History [name], [place]; 04/26/2017 11:17 AM) Caffeine use  Coffee, Tea. No alcohol use  No drug use  Tobacco use  Never smoker.  Family History [name], [place]; 04/26/2017 11:16 AM) Breast Cancer  Sister. Colon Cancer  Sister. Diabetes Mellitus  Brother, Sister, Son. Heart Disease  Brother, Daughter, Sister. Hypertension  Brother, Daughter, Son. Ischemic Bowel Disease   Sister. Malignant Neoplasm Of Pancreas  Sister. Migraine Headache  Daughter. Rectal Cancer  Sister. Thyroid problems  Sister.  Pregnancy / Birth History [name], [place]; 04/26/2017 11:17 AM) Age at menarche  8 years. Age of menopause  <47 Gravida  1 Maternal age  8-20 Para  2  Other Problems [name], [place]; 04/26/2017 11:17 AM) Anxiety Disorder  Arthritis  Atrial Fibrillation  Breast Cancer  Cervical Cancer  Diabetes Mellitus  Gastroesophageal Reflux Disease  Heart murmur  Hemorrhoids  High blood pressure  Sleep Apnea  Thyroid Cancer  Thyroid Disease     Review of Systems [name] RMA; 04/26/2017 11:17 AM) Skin Present- Dryness. Not Present- Change in Wart/Mole, Hives, Jaundice, New Lesions, Non-Healing Wounds, Rash and Ulcer. HEENT Present- Seasonal Allergies, Sore Throat and Wears glasses/contact lenses. Not Present- Earache, Hearing Loss, Hoarseness, Nose Bleed, Oral Ulcers, Ringing in the Ears, Sinus Pain, Visual Disturbances and Yellow Eyes. Respiratory Present- Chronic Cough and Wheezing. Not Present- Bloody sputum, Difficulty Breathing and Snoring. Cardiovascular Present- Difficulty Breathing Lying Down and Swelling of Extremities. Not Present- Chest Pain, Leg Cramps, Palpitations, Rapid Heart Rate and Shortness of Breath. Gastrointestinal Present- Change in Bowel Habits, Constipation and Hemorrhoids. Not Present- Abdominal Pain, Bloating, Bloody Stool, Chronic diarrhea, Difficulty Swallowing, Excessive gas, Gets full quickly at meals, Indigestion, Nausea, Rectal Pain and Vomiting. Female Genitourinary Not Present- Frequency, Nocturia, Painful Urination, Pelvic Pain and Urgency. Musculoskeletal Present- Joint Pain. Not Present- Back Pain, Joint Stiffness, Muscle Pain, Muscle Weakness and Swelling of Extremities. Neurological Present- Decreased Memory. Not Present- Fainting, Headaches, Numbness, Seizures, Tingling, Tremor,  Trouble walking and Weakness. Psychiatric Present-  Anxiety. Not Present- Bipolar, Change in Sleep Pattern, Depression, Fearful and Frequent crying. Endocrine Present- Hair Changes. Not Present- Cold Intolerance, Excessive Hunger, Heat Intolerance, Hot flashes and New Diabetes. Hematology Present- Blood Thinners and Easy Bruising. Not Present- Excessive bleeding, Gland problems, HIV and Persistent Infections.  Vitals [name] RMA; 04/26/2017 11:19 AM) 04/26/2017 11:18 AM Weight: 188.4 lb Height: 66in Body Surface Area: 1.95 m Body Mass Index: 30.41 kg/m  Temp.: 97.53F  Pulse: 73 (Regular)  BP: 150/80 (Sitting, Left Arm, Standard)       Assessment & Plan [name]; 04/26/2017 11:45 AM) HIATAL HERNIA WITH GERD (K21.9) Impression: Patient is an 83 year old female with a large hiatal hernia and GERD. Patient has A. fib. 1. Will obtain cardiac clearance and be okay to be autologous from [name]. 2. We will proceed to the operating room for robotic hiatal hernia repair and Nissen fundoplication. 3. I discussed with her the risks and benefits of the procedure to include but not limited to: Infection, bleeding, damage to structures, possible pneumothorax, possible esophageal injury, Possibly further surgery, and recurrence. Patient was understanding and wished to proceed.

## 2017-05-14 ENCOUNTER — Other Ambulatory Visit: Payer: Self-pay | Admitting: Cardiovascular Disease

## 2017-05-21 NOTE — Patient Instructions (Signed)
[name]  05/21/2017   Your procedure is scheduled on: 05/25/2017    Report to [facility] Main  Entrance Take [place]  elevators to 3rd floor to  [place] at   0800 AM.    Call this number if you have problems the morning of surgery [phone]    Remember: ONLY 1 PERSON MAY GO WITH YOU TO SHORT STAY TO GET  READY MORNING OF YOUR SURGERY.  Do not eat food or drink liquids :After Midnight.     Take these medicines the morning of surgery with A SIP OF WATER: Amiodarone ( Pacerone), diltiazem ( Cardizem), Flonase if neede, Prevacid, Synthrodi, Sertraline ( Zoloft) DO NOT TAKE ANY DIABETIC MEDICATIONS DAY OF YOUR SURGERY                               You may not have any metal on your body including hair pins and              piercings  Do not wear jewelry, make-up, lotions, powders or perfumes, deodorant             Do not wear nail polish.  Do not shave  48 hours prior to surgery.              Do not bring valuables to the hospital. [place].  Contacts, dentures or bridgework may not be worn into surgery.  Leave suitcase in the car. After surgery it may be brought to your room.                     Please read over the following fact sheets you were given: _____________________________________________________________________             [facility] - Preparing for Surgery Before surgery, you can play an important role.  Because skin is not sterile, your skin needs to be as free of germs as possible.  You can reduce the number of germs on your skin by washing with CHG (chlorahexidine gluconate) soap before surgery.  CHG is an antiseptic cleaner which kills germs and bonds with the skin to continue killing germs even after washing. Please DO NOT use if you have an allergy to CHG or antibacterial soaps.  If your skin becomes reddened/irritated stop using the CHG and inform your nurse when you  arrive at Short Stay. Do not shave (including legs and underarms) for at least 48 hours prior to the first CHG shower.  You may shave your face/neck. Please follow these instructions carefully:  1.  Shower with CHG Soap the night before surgery and the  morning of Surgery.  2.  If you choose to wash your hair, wash your hair first as usual with your  normal  shampoo.  3.  After you shampoo, rinse your hair and body thoroughly to remove the  shampoo.                           4.  Use CHG as you would any other liquid soap.  You can apply chg directly  to the skin and wash  Gently with a scrungie or clean washcloth.  5.  Apply the CHG Soap to your body ONLY FROM THE NECK DOWN.   Do not use on face/ open                           Wound or open sores. Avoid contact with eyes, ears mouth and genitals (private parts).                       Wash face,  Genitals (private parts) with your normal soap.             6.  Wash thoroughly, paying special attention to the area where your surgery  will be performed.  7.  Thoroughly rinse your body with warm water from the neck down.  8.  DO NOT shower/wash with your normal soap after using and rinsing off  the CHG Soap.                9.  Pat yourself dry with a clean towel.            10.  Wear clean pajamas.            11.  Place clean sheets on your bed the night of your first shower and do not  sleep with pets. Day of Surgery : Do not apply any lotions/deodorants the morning of surgery.  Please wear clean clothes to the hospital/surgery center.  FAILURE TO FOLLOW THESE INSTRUCTIONS MAY RESULT IN THE CANCELLATION OF YOUR SURGERY PATIENT SIGNATURE_________________________________  NURSE SIGNATURE__________________________________  ________________________________________________________________________

## 2017-05-22 ENCOUNTER — Encounter (HOSPITAL_COMMUNITY)
Admission: RE | Admit: 2017-05-22 | Discharge: 2017-05-22 | Disposition: A | Discharge status: Home / Self Care | Payer: Medicare Other | Source: Ambulatory Visit | Attending: General Surgery | Admitting: General Surgery

## 2017-05-22 ENCOUNTER — Encounter (HOSPITAL_COMMUNITY): Payer: Self-pay

## 2017-05-22 DIAGNOSIS — Z0183 Encounter for blood typing: Secondary | ICD-10-CM

## 2017-05-22 DIAGNOSIS — Z01812 Encounter for preprocedural laboratory examination: Secondary | ICD-10-CM | POA: Insufficient documentation

## 2017-05-22 DIAGNOSIS — K449 Diaphragmatic hernia without obstruction or gangrene: Secondary | ICD-10-CM

## 2017-05-22 LAB — CBC
HEMATOCRIT: 36.7 % (ref 36.0–46.0)
Hemoglobin: 12.2 g/dL (ref 12.0–15.0)
MCH: 30.6 pg (ref 26.0–34.0)
MCHC: 33.2 g/dL (ref 30.0–36.0)
MCV: 92 fL (ref 78.0–100.0)
PLATELETS: 152 10*3/uL (ref 150–400)
RBC: 3.99 MIL/uL (ref 3.87–5.11)
RDW: 14.6 % (ref 11.5–15.5)
WBC: 4.3 10*3/uL (ref 4.0–10.5)

## 2017-05-22 LAB — BASIC METABOLIC PANEL
Anion gap: 8 (ref 5–15)
BUN: 17 mg/dL (ref 6–20)
CHLORIDE: 105 mmol/L (ref 101–111)
CO2: 28 mmol/L (ref 22–32)
CREATININE: 0.81 mg/dL (ref 0.44–1.00)
Calcium: 9.2 mg/dL (ref 8.9–10.3)
GFR calc Af Amer: >60 mL/min (ref >60)
GFR calc non Af Amer: >60 mL/min (ref >60)
Glucose, Bld: 125 mg/dL — ABNORMAL HIGH (ref 65–99)
POTASSIUM: 4.2 mmol/L (ref 3.5–5.1)
Sodium: 141 mmol/L (ref 135–145)

## 2017-05-22 LAB — GLUCOSE, CAPILLARY: Glucose-Capillary: 114 mg/dL — ABNORMAL HIGH (ref 65–99)

## 2017-05-22 LAB — ABO/RH: ABO/RH(D): A POS

## 2017-05-22 NOTE — Progress Notes (Signed)
EKG-02/28/17-epic  LOV-04/19/17-cardiology-epic  ECHO-04/27/15-epic  01/24/17-LOV-Dr Laurann Montana on chart

## 2017-05-23 LAB — HEMOGLOBIN A1C
Hgb A1c MFr Bld: 6.7 % — ABNORMAL HIGH (ref 4.8–5.6)
Mean Plasma Glucose: 146 mg/dL

## 2017-05-25 ENCOUNTER — Ambulatory Visit (HOSPITAL_COMMUNITY): Payer: Medicare Other | Admitting: Anesthesiology

## 2017-05-25 ENCOUNTER — Encounter (HOSPITAL_COMMUNITY): Payer: Self-pay | Admitting: *Deleted

## 2017-05-25 ENCOUNTER — Inpatient Hospital Stay (HOSPITAL_COMMUNITY)
Admission: AD | Admit: 2017-05-25 | Discharge: 2017-05-28 | DRG: 327 | Disposition: A | Discharge status: Home / Self Care | Payer: Medicare Other | Source: Ambulatory Visit | Attending: General Surgery | Admitting: General Surgery

## 2017-05-25 ENCOUNTER — Encounter (HOSPITAL_COMMUNITY)
Admission: AD | Disposition: A | Discharge status: Home / Self Care | Payer: Self-pay | Source: Ambulatory Visit | Attending: General Surgery

## 2017-05-25 DIAGNOSIS — Z9889 Other specified postprocedural states: Secondary | ICD-10-CM | POA: Diagnosis present

## 2017-05-25 DIAGNOSIS — I10 Essential (primary) hypertension: Secondary | ICD-10-CM | POA: Diagnosis present

## 2017-05-25 DIAGNOSIS — E119 Type 2 diabetes mellitus without complications: Secondary | ICD-10-CM | POA: Diagnosis present

## 2017-05-25 DIAGNOSIS — K449 Diaphragmatic hernia without obstruction or gangrene: Secondary | ICD-10-CM | POA: Diagnosis present

## 2017-05-25 DIAGNOSIS — I4819 Other persistent atrial fibrillation: Secondary | ICD-10-CM | POA: Diagnosis present

## 2017-05-25 DIAGNOSIS — Z7901 Long term (current) use of anticoagulants: Secondary | ICD-10-CM

## 2017-05-25 DIAGNOSIS — I4811 Longstanding persistent atrial fibrillation: Secondary | ICD-10-CM | POA: Diagnosis present

## 2017-05-25 DIAGNOSIS — I481 Persistent atrial fibrillation: Secondary | ICD-10-CM | POA: Diagnosis present

## 2017-05-25 DIAGNOSIS — Z85850 Personal history of malignant neoplasm of thyroid: Secondary | ICD-10-CM

## 2017-05-25 DIAGNOSIS — Z7984 Long term (current) use of oral hypoglycemic drugs: Secondary | ICD-10-CM

## 2017-05-25 DIAGNOSIS — E7800 Pure hypercholesterolemia, unspecified: Secondary | ICD-10-CM | POA: Diagnosis present

## 2017-05-25 DIAGNOSIS — K219 Gastro-esophageal reflux disease without esophagitis: Secondary | ICD-10-CM | POA: Diagnosis present

## 2017-05-25 DIAGNOSIS — Z8541 Personal history of malignant neoplasm of cervix uteri: Secondary | ICD-10-CM

## 2017-05-25 DIAGNOSIS — K589 Irritable bowel syndrome without diarrhea: Secondary | ICD-10-CM | POA: Diagnosis present

## 2017-05-25 DIAGNOSIS — Z853 Personal history of malignant neoplasm of breast: Secondary | ICD-10-CM | POA: Diagnosis not present

## 2017-05-25 DIAGNOSIS — E1169 Type 2 diabetes mellitus with other specified complication: Secondary | ICD-10-CM | POA: Diagnosis present

## 2017-05-25 DIAGNOSIS — F411 Generalized anxiety disorder: Secondary | ICD-10-CM | POA: Diagnosis present

## 2017-05-25 HISTORY — DX: Malignant neoplasm of unspecified site of unspecified female breast: C50.919

## 2017-05-25 HISTORY — PX: INSERTION OF MESH: SHX5868

## 2017-05-25 HISTORY — DX: Anal fissure, unspecified: K60.2

## 2017-05-25 HISTORY — DX: Other specified postprocedural states: Z98.890

## 2017-05-25 HISTORY — DX: Unspecified atrial fibrillation: I48.91

## 2017-05-25 LAB — GLUCOSE, CAPILLARY
GLUCOSE-CAPILLARY: 144 mg/dL — AB (ref 65–99)
GLUCOSE-CAPILLARY: 177 mg/dL — AB (ref 65–99)
GLUCOSE-CAPILLARY: 178 mg/dL — AB (ref 65–99)
Glucose-Capillary: 183 mg/dL — ABNORMAL HIGH (ref 65–99)

## 2017-05-25 LAB — TYPE AND SCREEN
ABO/RH(D): A POS
ANTIBODY SCREEN: NEGATIVE

## 2017-05-25 SURGERY — FUNDOPLICATION, NISSEN, ROBOT-ASSISTED, LAPAROSCOPIC
Anesthesia: General | Site: Abdomen

## 2017-05-25 MED ORDER — ACETAMINOPHEN 10 MG/ML IV SOLN
INTRAVENOUS | Status: DC | PRN
  Administered 2017-05-25: 1000 mg via INTRAVENOUS

## 2017-05-25 MED ORDER — FENTANYL CITRATE (PF) 250 MCG/5ML IJ SOLN
INTRAMUSCULAR | Status: AC
  Filled 2017-05-25: qty 5

## 2017-05-25 MED ORDER — VANCOMYCIN HCL IN DEXTROSE 1-5 GM/200ML-% IV SOLN
1000.0000 mg | INTRAVENOUS | Status: AC
  Administered 2017-05-25: 1000 mg via INTRAVENOUS
  Filled 2017-05-25: qty 200

## 2017-05-25 MED ORDER — CHLORHEXIDINE GLUCONATE CLOTH 2 % EX PADS: 6.0000 | MEDICATED_PAD | Freq: Once | CUTANEOUS | Status: DC

## 2017-05-25 MED ORDER — INSULIN ASPART 100 UNIT/ML ~~LOC~~ SOLN
0.0000 [IU] | Freq: Three times a day (TID) | SUBCUTANEOUS | Status: DC
  Administered 2017-05-25: 3 [IU] via SUBCUTANEOUS

## 2017-05-25 MED ORDER — LACTATED RINGERS IV SOLN
INTRAVENOUS | Status: DC
Start: 2017-05-25 — End: 2017-05-25
  Administered 2017-05-25 (×3): via INTRAVENOUS

## 2017-05-25 MED ORDER — HYDROMORPHONE HCL-NACL 0.5-0.9 MG/ML-% IV SOSY: 1.0000 mg | PREFILLED_SYRINGE | INTRAVENOUS | Status: DC | PRN

## 2017-05-25 MED ORDER — ONDANSETRON HCL 4 MG/2ML IJ SOLN
INTRAMUSCULAR | Status: DC | PRN
  Administered 2017-05-25: 4 mg via INTRAVENOUS

## 2017-05-25 MED ORDER — MIDAZOLAM HCL 2 MG/2ML IJ SOLN
INTRAMUSCULAR | Status: AC
  Filled 2017-05-25: qty 2

## 2017-05-25 MED ORDER — 0.9 % SODIUM CHLORIDE (POUR BTL) OPTIME
TOPICAL | Status: DC | PRN
  Administered 2017-05-25: 1000 mL

## 2017-05-25 MED ORDER — PHENYLEPHRINE HCL 10 MG/ML IJ SOLN
INTRAMUSCULAR | Status: AC
  Filled 2017-05-25: qty 1

## 2017-05-25 MED ORDER — PHENYLEPHRINE HCL 10 MG/ML IJ SOLN
INTRAMUSCULAR | Status: DC | PRN
  Administered 2017-05-25: 25 ug/min via INTRAVENOUS

## 2017-05-25 MED ORDER — SUCCINYLCHOLINE CHLORIDE 20 MG/ML IJ SOLN
INTRAMUSCULAR | Status: DC | PRN
  Administered 2017-05-25: 120 mg via INTRAVENOUS

## 2017-05-25 MED ORDER — GABAPENTIN 300 MG PO CAPS
300.0000 mg | ORAL_CAPSULE | ORAL | Status: AC
  Administered 2017-05-25: 300 mg via ORAL
  Filled 2017-05-25: qty 1

## 2017-05-25 MED ORDER — GLYCOPYRROLATE 0.2 MG/ML IJ SOLN
INTRAMUSCULAR | Status: DC | PRN
  Administered 2017-05-25: 0.2 mg via INTRAVENOUS

## 2017-05-25 MED ORDER — FENTANYL CITRATE (PF) 100 MCG/2ML IJ SOLN
INTRAMUSCULAR | Status: DC | PRN
  Administered 2017-05-25: 50 ug via INTRAVENOUS
  Administered 2017-05-25 (×2): 100 ug via INTRAVENOUS
  Administered 2017-05-25 (×3): 50 ug via INTRAVENOUS

## 2017-05-25 MED ORDER — ENOXAPARIN SODIUM 40 MG/0.4ML ~~LOC~~ SOLN
40.0000 mg | SUBCUTANEOUS | Status: DC
  Administered 2017-05-26 – 2017-05-27 (×2): 40 mg via SUBCUTANEOUS
  Filled 2017-05-25: qty 0.4

## 2017-05-25 MED ORDER — ONDANSETRON HCL 4 MG/2ML IJ SOLN: 4.0000 mg | Freq: Four times a day (QID) | INTRAMUSCULAR | Status: DC | PRN

## 2017-05-25 MED ORDER — DEXTROSE-NACL 5-0.9 % IV SOLN
INTRAVENOUS | Status: DC
  Administered 2017-05-25 – 2017-05-27 (×3): via INTRAVENOUS

## 2017-05-25 MED ORDER — BUPIVACAINE-EPINEPHRINE 0.25% -1:200000 IJ SOLN
INTRAMUSCULAR | Status: DC | PRN
  Administered 2017-05-25: 15 mL

## 2017-05-25 MED ORDER — ORAL CARE MOUTH RINSE
15.0000 mL | Freq: Two times a day (BID) | OROMUCOSAL | Status: DC
  Administered 2017-05-25 – 2017-05-27 (×2): 15 mL via OROMUCOSAL

## 2017-05-25 MED ORDER — FENTANYL CITRATE (PF) 100 MCG/2ML IJ SOLN
25.0000 ug | INTRAMUSCULAR | Status: DC | PRN
Start: 2017-05-25 — End: 2017-05-25

## 2017-05-25 MED ORDER — LIDOCAINE 2% (20 MG/ML) 5 ML SYRINGE
INTRAMUSCULAR | Status: AC
  Filled 2017-05-25: qty 5

## 2017-05-25 MED ORDER — CHLORHEXIDINE GLUCONATE 0.12 % MT SOLN
15.0000 mL | Freq: Two times a day (BID) | OROMUCOSAL | Status: DC
  Administered 2017-05-25 – 2017-05-28 (×6): 15 mL via OROMUCOSAL
  Filled 2017-05-25 (×6): qty 15

## 2017-05-25 MED ORDER — HYDROMORPHONE HCL-NACL 0.5-0.9 MG/ML-% IV SOSY
1.0000 mg | PREFILLED_SYRINGE | INTRAVENOUS | Status: DC | PRN
  Administered 2017-05-25 – 2017-05-28 (×8): 1 mg via INTRAVENOUS
  Filled 2017-05-25 (×7): qty 2
  Filled 2017-05-25: qty 4

## 2017-05-25 MED ORDER — DEXAMETHASONE SODIUM PHOSPHATE 10 MG/ML IJ SOLN
INTRAMUSCULAR | Status: AC
  Filled 2017-05-25: qty 1

## 2017-05-25 MED ORDER — ONDANSETRON HCL 4 MG/2ML IJ SOLN: 4.0000 mg | INTRAMUSCULAR | Status: DC | PRN

## 2017-05-25 MED ORDER — PROPOFOL 10 MG/ML IV BOLUS
INTRAVENOUS | Status: DC | PRN
  Administered 2017-05-25: 160 mg via INTRAVENOUS
  Administered 2017-05-25: 20 mg via INTRAVENOUS

## 2017-05-25 MED ORDER — ACETAMINOPHEN 500 MG PO TABS
1000.0000 mg | ORAL_TABLET | ORAL | Status: AC
  Administered 2017-05-25: 1000 mg via ORAL
  Filled 2017-05-25: qty 2

## 2017-05-25 MED ORDER — GLYCOPYRROLATE 0.2 MG/ML IV SOSY
PREFILLED_SYRINGE | INTRAVENOUS | Status: AC
  Filled 2017-05-25: qty 5

## 2017-05-25 MED ORDER — EPHEDRINE 5 MG/ML INJ
INTRAVENOUS | Status: AC
  Filled 2017-05-25: qty 10

## 2017-05-25 MED ORDER — SUGAMMADEX SODIUM 200 MG/2ML IV SOLN
INTRAVENOUS | Status: AC
  Filled 2017-05-25: qty 2

## 2017-05-25 MED ORDER — CHLORHEXIDINE GLUCONATE CLOTH 2 % EX PADS
6.0000 | MEDICATED_PAD | Freq: Once | CUTANEOUS | Status: DC
  Administered 2017-05-25: 6 via TOPICAL

## 2017-05-25 MED ORDER — LIDOCAINE HCL (CARDIAC) 20 MG/ML IV SOLN
INTRAVENOUS | Status: DC | PRN
  Administered 2017-05-25: 25 mg via INTRATRACHEAL
  Administered 2017-05-25: 75 mg via INTRAVENOUS

## 2017-05-25 MED ORDER — ONDANSETRON 4 MG PO TBDP
4.0000 mg | ORAL_TABLET | Freq: Four times a day (QID) | ORAL | Status: DC | PRN
Start: 2017-05-25 — End: 2017-05-28

## 2017-05-25 MED ORDER — ROCURONIUM BROMIDE 50 MG/5ML IV SOSY
PREFILLED_SYRINGE | INTRAVENOUS | Status: AC
  Filled 2017-05-25: qty 10

## 2017-05-25 MED ORDER — PROPOFOL 10 MG/ML IV BOLUS
INTRAVENOUS | Status: AC
  Filled 2017-05-25: qty 20

## 2017-05-25 MED ORDER — ROCURONIUM BROMIDE 100 MG/10ML IV SOLN
INTRAVENOUS | Status: DC | PRN
  Administered 2017-05-25: 20 mg via INTRAVENOUS
  Administered 2017-05-25: 50 mg via INTRAVENOUS
  Administered 2017-05-25 (×2): 10 mg via INTRAVENOUS

## 2017-05-25 MED ORDER — LACTATED RINGERS IR SOLN
Status: DC | PRN
  Administered 2017-05-25: 1000 mL

## 2017-05-25 MED ORDER — ACETAMINOPHEN 10 MG/ML IV SOLN
INTRAVENOUS | Status: AC
  Filled 2017-05-25: qty 100

## 2017-05-25 MED ORDER — INSULIN ASPART 100 UNIT/ML ~~LOC~~ SOLN
0.0000 [IU] | Freq: Four times a day (QID) | SUBCUTANEOUS | Status: DC
  Administered 2017-05-26 (×3): 3 [IU] via SUBCUTANEOUS
  Administered 2017-05-26: 5 [IU] via SUBCUTANEOUS
  Administered 2017-05-27 (×3): 3 [IU] via SUBCUTANEOUS
  Administered 2017-05-27: 5 [IU] via SUBCUTANEOUS
  Administered 2017-05-28 (×3): 3 [IU] via SUBCUTANEOUS

## 2017-05-25 MED ORDER — SUCCINYLCHOLINE CHLORIDE 200 MG/10ML IV SOSY
PREFILLED_SYRINGE | INTRAVENOUS | Status: AC
  Filled 2017-05-25: qty 10

## 2017-05-25 MED ORDER — DEXAMETHASONE SODIUM PHOSPHATE 10 MG/ML IJ SOLN
INTRAMUSCULAR | Status: DC | PRN
  Administered 2017-05-25: 10 mg via INTRAVENOUS

## 2017-05-25 MED ORDER — SUGAMMADEX SODIUM 200 MG/2ML IV SOLN
INTRAVENOUS | Status: DC | PRN
  Administered 2017-05-25: 200 mg via INTRAVENOUS

## 2017-05-25 MED ORDER — BUPIVACAINE-EPINEPHRINE (PF) 0.25% -1:200000 IJ SOLN
INTRAMUSCULAR | Status: AC
  Filled 2017-05-25: qty 30

## 2017-05-25 MED ORDER — EPHEDRINE SULFATE 50 MG/ML IJ SOLN
INTRAMUSCULAR | Status: DC | PRN
  Administered 2017-05-25: 10 mg via INTRAVENOUS

## 2017-05-25 MED ORDER — ONDANSETRON HCL 4 MG/2ML IJ SOLN
INTRAMUSCULAR | Status: AC
  Filled 2017-05-25: qty 2

## 2017-05-25 MED ORDER — MIDAZOLAM HCL 5 MG/5ML IJ SOLN
INTRAMUSCULAR | Status: DC | PRN
  Administered 2017-05-25 (×2): 0.5 mg via INTRAVENOUS

## 2017-05-25 SURGICAL SUPPLY — 63 items
APPLIER CLIP 5 13 M/L LIGAMAX5 (MISCELLANEOUS)
APPLIER CLIP ROT 10 11.4 M/L (STAPLE)
BLADE SURG SZ11 CARB STEEL (BLADE) ×3 IMPLANT
CHLORAPREP W/TINT 26ML (MISCELLANEOUS) ×3 IMPLANT
CLIP APPLIE 5 13 M/L LIGAMAX5 (MISCELLANEOUS) IMPLANT
CLIP APPLIE ROT 10 11.4 M/L (STAPLE) IMPLANT
CLIP LIGATING HEM O LOK PURPLE (MISCELLANEOUS) IMPLANT
CLIP LIGATING HEMO O LOK GREEN (MISCELLANEOUS) IMPLANT
COVER SURGICAL LIGHT HANDLE (MISCELLANEOUS) ×3 IMPLANT
COVER TIP SHEARS 8 DVNC (MISCELLANEOUS) ×2 IMPLANT
COVER TIP SHEARS 8MM DA VINCI (MISCELLANEOUS) ×1
DECANTER SPIKE VIAL GLASS SM (MISCELLANEOUS) IMPLANT
DERMABOND ADVANCED (GAUZE/BANDAGES/DRESSINGS)
DERMABOND ADVANCED .7 DNX12 (GAUZE/BANDAGES/DRESSINGS) IMPLANT
DEVICE TROCAR PUNCTURE CLOSURE (ENDOMECHANICALS) ×3 IMPLANT
DRAIN PENROSE 18X1/2 LTX STRL (DRAIN) ×3 IMPLANT
DRAPE ARM DVNC X/XI (DISPOSABLE) ×8 IMPLANT
DRAPE COLUMN DVNC XI (DISPOSABLE) ×2 IMPLANT
DRAPE DA VINCI XI ARM (DISPOSABLE) ×4
DRAPE DA VINCI XI COLUMN (DISPOSABLE) ×1
DRAPE SHEET LG 3/4 BI-LAMINATE (DRAPES) IMPLANT
ELECT REM PT RETURN 15FT ADLT (MISCELLANEOUS) ×3 IMPLANT
ENDOLOOP SUT PDS II  0 18 (SUTURE)
ENDOLOOP SUT PDS II 0 18 (SUTURE) IMPLANT
GAUZE SPONGE 4X4 16PLY XRAY LF (GAUZE/BANDAGES/DRESSINGS) ×3 IMPLANT
GLOVE BIO SURGEON STRL SZ7.5 (GLOVE) ×6 IMPLANT
GOWN STRL REUS W/TWL XL LVL3 (GOWN DISPOSABLE) ×12 IMPLANT
IRRIG SUCT STRYKERFLOW 2 WTIP (MISCELLANEOUS)
IRRIGATION SUCT STRKRFLW 2 WTP (MISCELLANEOUS) IMPLANT
KIT BASIN OR (CUSTOM PROCEDURE TRAY) ×3 IMPLANT
MARKER SKIN DUAL TIP RULER LAB (MISCELLANEOUS) ×3 IMPLANT
MESH BIO-A 7X10 SYN MAT (Mesh General) ×3 IMPLANT
NEEDLE HYPO 22GX1.5 SAFETY (NEEDLE) ×3 IMPLANT
NEEDLE INSUFFLATION 14GA 120MM (NEEDLE) ×3 IMPLANT
OBTURATOR OPTICAL STANDARD 8MM (TROCAR)
OBTURATOR OPTICAL STND 8 DVNC (TROCAR)
OBTURATOR OPTICALSTD 8 DVNC (TROCAR) IMPLANT
PACK CARDIOVASCULAR III (CUSTOM PROCEDURE TRAY) ×3 IMPLANT
PAD POSITIONING PINK XL (MISCELLANEOUS) ×3 IMPLANT
SCISSORS LAP 5X35 DISP (ENDOMECHANICALS) ×3 IMPLANT
SEAL CANN UNIV 5-8 DVNC XI (MISCELLANEOUS) ×8 IMPLANT
SEAL XI 5MM-8MM UNIVERSAL (MISCELLANEOUS) ×4
SEALER VESSEL DA VINCI XI (MISCELLANEOUS) ×1
SEALER VESSEL EXT DVNC XI (MISCELLANEOUS) ×2 IMPLANT
SET BI-LUMEN FLTR TB AIRSEAL (TUBING) ×3 IMPLANT
SET IRRIG TUBING LAPAROSCOPIC (IRRIGATION / IRRIGATOR) ×3 IMPLANT
SLEEVE XCEL OPT CAN 5 100 (ENDOMECHANICALS) IMPLANT
SOLUTION ANTI FOG 6CC (MISCELLANEOUS) ×3 IMPLANT
SOLUTION ELECTROLUBE (MISCELLANEOUS) ×3 IMPLANT
STAPLER VISISTAT 35W (STAPLE) IMPLANT
SUT ETHIBOND 0 36 GRN (SUTURE) ×6 IMPLANT
SUT MNCRL AB 4-0 PS2 18 (SUTURE) ×3 IMPLANT
SUT SILK 0 SH 30 (SUTURE) ×3 IMPLANT
SUT SILK 2 0 SH (SUTURE) ×6 IMPLANT
SYR 10ML LL (SYRINGE) ×3 IMPLANT
SYR 20CC LL (SYRINGE) ×3 IMPLANT
TOWEL OR 17X26 10 PK STRL BLUE (TOWEL DISPOSABLE) ×3 IMPLANT
TOWEL OR NON WOVEN STRL DISP B (DISPOSABLE) ×3 IMPLANT
TRAY FOLEY CATH SILVER 14FR (SET/KITS/TRAYS/PACK) ×3 IMPLANT
TRAY FOLEY W/METER SILVER 16FR (SET/KITS/TRAYS/PACK) IMPLANT
TROCAR ADV FIXATION 5X100MM (TROCAR) ×3 IMPLANT
TUBING CONNECTING 10 (TUBING) IMPLANT
TUBING ENDO SMARTCAP PENTAX (MISCELLANEOUS) IMPLANT

## 2017-05-25 NOTE — Progress Notes (Signed)
Report given to Magda Paganini, RN at this time. Care assumed by Magda Paganini, RN.

## 2017-05-25 NOTE — Interval H&P Note (Signed)
History and Physical Interval Note:  05/25/2017 9:16 AM  [name]  has presented today for surgery, with the diagnosis of hiatal hernia  The various methods of treatment have been discussed with the patient and family. After consideration of risks, benefits and other options for treatment, the patient has consented to  Procedure(s): XI ROBOTIC HIATAL HERNIA REPAIR AND NISSEN FUNDOPLICATION (N/A) INSERTION OF MESH (N/A) as a surgical intervention .  The patient's history has been reviewed, patient examined, no change in status, stable for surgery.  I have reviewed the patient's chart and labs.  Questions were answered to the patient's satisfaction.     Rosario Jacks., Anne Hahn

## 2017-05-25 NOTE — Anesthesia Procedure Notes (Signed)
Procedure Name: Intubation Date/Time: 05/25/2017 10:18 AM Performed by: Lissa Morales Pre-anesthesia Checklist: Patient identified, Emergency Drugs available, Suction available and Patient being monitored Patient Re-evaluated:Patient Re-evaluated prior to induction Oxygen Delivery Method: Circle system utilized Preoxygenation: Pre-oxygenation with 100% oxygen Induction Type: IV induction Ventilation: Mask ventilation without difficulty Laryngoscope Size: Miller and 3 Grade View: Grade II Tube type: Oral Tube size: 7.5 mm Number of attempts: 1 Airway Equipment and Method: Stylet and Oral airway Placement Confirmation: ETT inserted through vocal cords under direct vision,  positive ETCO2 and breath sounds checked- equal and bilateral Secured at: 21 cm Tube secured with: Tape Dental Injury: Teeth and Oropharynx as per pre-operative assessment

## 2017-05-25 NOTE — Progress Notes (Signed)
Patient arrived to unit with PACU RN.  Five lap sites with dermabond to abdomen.  2L O2 via [place].  Vital signs taken. See flowsheet for full assessment. Patient 2/10 pain at this time.

## 2017-05-25 NOTE — Anesthesia Preprocedure Evaluation (Addendum)
Anesthesia Evaluation  Patient identified by MRN, date of birth, ID band Patient awake    Reviewed: Allergy & Precautions, H&P , NPO status , Patient's Chart, lab work & pertinent test results  Airway Mallampati: III  TM Distance: >3 FB Neck ROM: Full    Dental no notable dental hx. (+) Teeth Intact, Dental Advisory Given   Pulmonary sleep apnea and Continuous Positive Airway Pressure Ventilation ,    Pulmonary exam normal breath sounds clear to auscultation       Cardiovascular hypertension, Pt. on medications + dysrhythmias Atrial Fibrillation  Rhythm:Regular Rate:Normal     Neuro/Psych  Headaches, Anxiety negative psych ROS   GI/Hepatic Neg liver ROS, hiatal hernia, GERD  Medicated and Controlled,  Endo/Other  diabetes, Type 2, Oral Hypoglycemic AgentsHypothyroidism   Renal/GU negative Renal ROS  negative genitourinary   Musculoskeletal  (+) Arthritis ,   Abdominal   Peds  Hematology negative hematology ROS (+) anemia ,   Anesthesia Other Findings   Reproductive/Obstetrics negative OB ROS                            Anesthesia Physical Anesthesia Plan  ASA: III  Anesthesia Plan: General   Post-op Pain Management:    Induction: Intravenous  PONV Risk Score and Plan: 4 or greater and Ondansetron, Dexamethasone and Treatment may vary due to age or medical condition  Airway Management Planned: Oral ETT  Additional Equipment:   Intra-op Plan:   Post-operative Plan: Extubation in OR  Informed Consent: I have reviewed the patients History and Physical, chart, labs and discussed the procedure including the risks, benefits and alternatives for the proposed anesthesia with the patient or authorized representative who has indicated his/her understanding and acceptance.   Dental advisory given  Plan Discussed with: CRNA  Anesthesia Plan Comments:         Anesthesia Quick  Evaluation

## 2017-05-25 NOTE — Progress Notes (Signed)
Pt./staff member had just placed pt. on CPAP prior to my arrival, oxygen in line/on @ 4lpm, bedside pulse oximeter showing 95% sats,  pt. tolerating well, RT to monitor.

## 2017-05-25 NOTE — Progress Notes (Signed)
Report received from [place], [place]. Care assumed for this pt at this time. Pt is resting calmly and has no s/s of distress. VS and orders assessed and pt denies complaints. Will continue to monitor and tx pt according to MD orders. 31 Min call ahead done at this time.

## 2017-05-25 NOTE — Op Note (Signed)
05/25/2017  1:40 PM  PATIENT:  [name]  83 y.o. female  PRE-OPERATIVE DIAGNOSIS:  hiatal hernia  POST-OPERATIVE DIAGNOSIS:  hiatal hernia  PROCEDURE:  Procedure(s): XI ROBOTIC HIATAL HERNIA REPAIR AND NISSEN FUNDOPLICATION (N/A) INSERTION OF MESH (N/A)  SURGEON:  Surgeon(s) and Role:    * Ralene Ok, MD - Primary    * Clovis Riley, MD - Assisting  ANESTHESIA:   local and general  EBL:  Total I/O In: 2000 [I.V.:2000] Out: 400 [Urine:350; Blood:50]  BLOOD ADMINISTERED:none  DRAINS: none   LOCAL MEDICATIONS USED:  BUPIVICAINE   SPECIMEN:  No Specimen  DISPOSITION OF SPECIMEN:  N/A  COUNTS:  YES  TOURNIQUET:  * No tourniquets in log *  DICTATION: .Dragon Dictation The patient was taken back to the operating room and placed in the supine position with bilateral SCDs in place. The patient was prepped and draped in the usual sterile fashion. After appropriate antibiotics were confirmed a timeout was called and all facts were verified.   A Veress needle technique was used to insufflate the abdomen to 15 mm of mercury the paramedian stab incision. Subsequent to this an 8 mm trocar was introduced as was a 8 millimeter camera. At this time the subsequent robotic trochars x3, were then placed adjacent to this trocar approximately 8-10 cm away. Each trocar was inserted under direct visualization, there were total of 4 trochars. The assistant trocar was then placed in the right lower quadrant under direct visualization. The Nathanson retractor was then visualized inserted into the abdomen and the incision just to the left of the falciform ligament. This was then placed to retract the liver appropriately. At this time the patient was positioned in reverse Trendelenburg.   At this time the robot patient cart was brought to the bedside and placed in good position and the arms were docked to the trochars appropriately. At this time I proceeded to incised the gastrohepatic  ligament.  At this time I proceeded to mobilize the stomach inferiorly and visualize the right crus. The peritoneum over the right crus was incised and right crus was identified. I proceeded to dissect this inferiorly until the left crus was seen joining the right crus. Once the right crus was adequately dissected we turned our to the left crus which was dissected away. This required traction of the stomach to the right side. Once this was visualized we then proceeded to circumferentially dissect the esophagus away from the surrounding tissue. At this time a Penrose drain was placed around the esophagus to help with retraction. At this time the phrenoesophageal fat pad was dissected away from the esophagus. There was a moderate-sized hiatal hernia seen. I mobilized the esophagus cephalad approximately 4-5 cm, clearing away the surrounding tissue. The anterior hernia sac was dissected away from the stomach and esophagus.  At this time we turned our attention to the greater curvature the stomach and the omentum was mobilized using the robotic vessel sealer. This was taken up to the greater curvature to the hiatus. This mobilized the entire greater curvature to allow mobilization and the wrap. I then proceeded to bring the greater curvature the stomach posterior to the esophagus, and a shoeshine technique was used to evaluate the mobilization of the greater curvature.   At this time I proceeded to close the hiatus using figure of 8 0 Ethibonds x 3. This brought together the hiatal closure without undue stricture to the esophagus.   A piece of Gore Bio A hiatal  mesh was placed over the hiatal closure and sutured to the crus using 2-0 silk sutures x 4.  At this time the greater curvature was brought around the esophagus and sutured using 0 silk sutures interrupted fashion approximately 1 cm apart x3. The middle suture was sutured to the esophagus. A left collar stitch was then used to gastropexy the stomach from  the wrap to the diaphragm just lateral to the left crus as.  A second collar stitch was placed from the wrap to the right crus. The wrap lay at approximately 11:00 on its own with undue tension.  The wrap lay loose with no strangulation of the esophagus.  At this time the robot was undocked. The liver trocar was removed. At this time insufflation was evacuated. Skin was reapproximated for Monocryl subcuticular fashion. The skin was then dressed with Dermabond. The patient tolerated the procedure well and was taken to the recovery room in stable condition.    PLAN OF CARE: Admit to inpatient   PATIENT DISPOSITION:  PACU - hemodynamically stable.   Delay start of Pharmacological VTE agent (>24hrs) due to surgical blood loss or risk of bleeding: no

## 2017-05-25 NOTE — Transfer of Care (Signed)
Immediate Anesthesia Transfer of Care Note  Patient: [name]  Procedure(s) Performed: Procedure(s): XI ROBOTIC HIATAL HERNIA REPAIR AND NISSEN FUNDOPLICATION (N/A) INSERTION OF MESH (N/A)  Patient Location: PACU  Anesthesia Type:General  Level of Consciousness: awake, alert , oriented and patient cooperative  Airway & Oxygen Therapy: Patient Spontanous Breathing and Patient connected to face mask oxygen  Post-op Assessment: Report given to RN, Post -op Vital signs reviewed and stable and Patient moving all extremities X 4  Post vital signs: stable  Last Vitals:  Vitals:   05/25/17 0730  BP: (!) 150/80  Pulse: 63  Resp: 18  Temp: 37.1 C    Last Pain:  Vitals:   05/25/17 0730  TempSrc: Oral      Patients Stated Pain Goal: 3 (02/33/43 5686)  Complications: No apparent anesthesia complications

## 2017-05-25 NOTE — Discharge Instructions (Signed)
EATING AFTER YOUR ESOPHAGEAL SURGERY (Stomach Fundoplication, Hiatal Hernia repair, Achalasia surgery, etc)  ######################################################################  EAT Start with a pureed / full liquid diet (see below) Gradually transition to a high fiber diet with a fiber supplement over the next month after discharge.    WALK Walk an hour a day.  Control your pain to do that.    CONTROL PAIN Control pain so that you can walk, sleep, tolerate sneezing/coughing, go up/down stairs.  HAVE A BOWEL MOVEMENT DAILY Keep your bowels regular to avoid problems.  OK to try a laxative to override constipation.  OK to use an antidairrheal to slow down diarrhea.  Call if not better after 2 tries  CALL IF YOU HAVE PROBLEMS/CONCERNS Call if you are still struggling despite following these instructions. Call if you have concerns not answered by these instructions  ######################################################################   After your esophageal surgery, expect some sticking with swallowing over the next 1-2 months.    If food sticks when you eat, it is called "dysphagia".  This is due to swelling around your esophagus at the wrap & hiatal diaphragm repair.  It will gradually ease off over the next few months.  To help you through this temporary phase, we start you out on a pureed (blenderized) diet.  Your first meal in the hospital was thin liquids.  You should have been given a pureed diet by the time you left the hospital.  We ask patients to stay on a pureed diet for the first 2-3 weeks to avoid anything getting "stuck" near your recent surgery.  Don't be alarmed if your ability to swallow doesn't progress according to this plan.  Everyone is different and some diets can advance more or less quickly.     Some BASIC RULES to follow are:  Maintain an upright position whenever eating or drinking.  Take small bites - just a teaspoon size bite at a time.  Eat slowly.   It may also help to eat only one food at a time.  Consider nibbling through smaller, more frequent meals & avoid the urge to eat BIG meals  Do not push through feelings of fullness, nausea, or bloatedness  Do not mix solid foods and liquids in the same mouthful  Try not to "wash foods down" with large gulps of liquids.  Avoid carbonated (bubbly/fizzy) drinks.    Avoid foods that make you feel gassy or bloated.  Start with bland foods first.  Wait on trying greasy, fried, or spicy meals until you are tolerating more bland solids well.  Understand that it will be hard to burp and belch at first.  This gradually improves with time.  Expect to be more gassy/flatulent/bloated initially.  Walking will help your body manage it better.  Consider using medications for bloating that contain simethicone such as  Maalox or Gas-X   Eat in a relaxed atmosphere & minimize distractions.  Avoid talking while eating.    Do not use straws.  Following each meal, sit in an upright position (90 degree angle) for 60 to 90 minutes.  Going for a short walk can help as well  If food does stick, don't panic.  Try to relax and let the food pass on its own.  Sipping WARM LIQUID such as strong hot black tea can also help slide it down.   Be gradual in changes & use common sense:  -If you easily tolerating a certain "level" of foods, advance to the next level gradually -If you are  having trouble swallowing a particular food, then avoid it.   -If food is sticking when you advance your diet, go back to thinner previous diet (the lower LEVEL) for 1-2 days.  LEVEL 1 = PUREED DIET  Do for the first 2 WEEKS AFTER SURGERY  -Foods in this group are pureed or blenderized to a smooth, mashed potato-like consistency.  -If necessary, the pureed foods can keep their shape with the addition of a thickening agent.   -Meat should be pureed to a smooth, pasty consistency.  Hot broth or gravy may be added to the pureed  meat, approximately 1 oz. of liquid per 3 oz. serving of meat. -CAUTION:  If any foods do not puree into a smooth consistency, swallowing will be more difficult.  (For example, nuts or seeds sometimes do not blend well.)  Hot Foods Cold Foods  Pureed scrambled eggs and cheese Pureed cottage cheese  Baby cereals Thickened juices and nectars  Thinned cooked cereals (no lumps) Thickened milk or eggnog  Pureed [place] toast or pancakes Ensure  Mashed potatoes Ice cream  Pureed parsley, au gratin, scalloped potatoes, candied sweet potatoes Fruit or [place] ice, sherbet  Pureed buttered or alfredo noodles Plain yogurt  Pureed vegetables (no corn or peas) Instant breakfast  Pureed soups and creamed soups Smooth pudding, mousse, custard  Pureed scalloped apples Whipped gelatin  Gravies Sugar, syrup, honey, jelly  Sauces, cheese, tomato, barbecue, white, creamed Cream  Any baby food Creamer  Alcohol in moderation (not beer or champagne) Margarine  Coffee or tea Mayonnaise   Ketchup, mustard   Apple sauce   SAMPLE MENU:  PUREED DIET Breakfast Lunch Dinner   Orange juice, 1/2 cup  Cream of wheat, 1/2 cup  Pineapple juice, 1/2 cup  Pureed [place], barley soup, 3/4 cup  Pureed Hawaiian chicken, 3 oz   Scrambled eggs, mashed or blended with cheese, 1/2 cup  Tea or coffee, 1 cup   Whole milk, 1 cup   Non-dairy creamer, 2 Tbsp.  Mashed potatoes, 1/2 cup  Pureed cooled broccoli, 1/2 cup  Apple sauce, 1/2 cup  Coffee or tea  Mashed potatoes, 1/2 cup  Pureed spinach, 1/2 cup  Frozen yogurt, 1/2 cup  Tea or coffee      LEVEL 2 = SOFT DIET  After your first 2 weeks, you can advance to a soft diet.   Keep on this diet until everything goes down easily.  Hot Foods Cold Foods  White fish Cottage cheese  Stuffed fish Junior baby fruit  Baby food meals Semi thickened juices  Minced soft cooked, scrambled, poached eggs nectars  Souffle & omelets Ripe mashed bananas  Cooked  cereals Canned fruit, pineapple sauce, milk  potatoes Milkshake  Buttered or Alfredo noodles Custard  Cooked cooled vegetable Puddings, including tapioca  Sherbet Yogurt  Vegetable soup or alphabet soup Fruit ice, [place] ice  Gravies Whipped gelatin  Sugar, syrup, honey, jelly Junior baby desserts  Sauces:  Cheese, creamed, barbecue, tomato, white Cream  Coffee or tea Margarine   SAMPLE MENU:  LEVEL 2 Breakfast Lunch Dinner   Orange juice, 1/2 cup  Oatmeal, 1/2 cup  Scrambled eggs with cheese, 1/2 cup  Decaffeinated tea, 1 cup  Whole milk, 1 cup  Non-dairy creamer, 2 Tbsp  Pineapple juice, 1/2 cup  Minced beef, 3 oz  Gravy, 2 Tbsp  Mashed potatoes, 1/2 cup  Minced fresh broccoli, 1/2 cup  Applesauce, 1/2 cup  Coffee, 1 cup  [place], barley soup, 3/4 cup  Minced Hawaiian chicken, 3 oz  Mashed potatoes, 1/2 cup  Cooked spinach, 1/2 cup  Frozen yogurt, 1/2 cup  Non-dairy creamer, 2 Tbsp      LEVEL 3 = CHOPPED DIET  -After all the foods in level 2 (soft diet) are passing through well you should advance up to more chopped foods.  -It is still important to cut these foods into small pieces and eat slowly.  Hot Foods Cold Foods  Poultry Cottage cheese  Chopped Swedish meatballs Yogurt  Meat salads (ground or flaked meat) Milk  Flaked fish (tuna) Milkshakes  Poached or scrambled eggs Soft, cold, dry cereal  Souffles and omelets Fruit juices or nectars  Cooked cereals Chopped canned fruit  Chopped [place] toast or pancakes Canned fruit cocktail  Noodles or pasta (no rice) Pudding, mousse, custard  Cooked vegetables (no frozen peas, corn, or mixed vegetables) Green salad  Canned small sweet peas Ice cream  Creamed soup or vegetable soup Fruit ice, [place] ice  Pureed vegetable soup or alphabet soup Non-dairy creamer  Ground scalloped apples Margarine  Gravies Mayonnaise  Sauces:  Cheese, creamed, barbecue, tomato, white Ketchup  Coffee or tea Mustard    SAMPLE MENU:  LEVEL 3 Breakfast Lunch Dinner   Orange juice, 1/2 cup  Oatmeal, 1/2 cup  Scrambled eggs with cheese, 1/2 cup  Decaffeinated tea, 1 cup  Whole milk, 1 cup  Non-dairy creamer, 2 Tbsp  Ketchup, 1 Tbsp  Margarine, 1 tsp  Salt, 1/4 tsp  Sugar, 2 tsp  Pineapple juice, 1/2 cup  Ground beef, 3 oz  Gravy, 2 Tbsp  Mashed potatoes, 1/2 cup  Cooked spinach, 1/2 cup  Applesauce, 1/2 cup  Decaffeinated coffee  Whole milk  Non-dairy creamer, 2 Tbsp  Margarine, 1 tsp  Salt, 1/4 tsp  Pureed [place], barley soup, 3/4 cup  Barbecue chicken, 3 oz  Mashed potatoes, 1/2 cup  Ground fresh broccoli, 1/2 cup  Frozen yogurt, 1/2 cup  Decaffeinated tea, 1 cup  Non-dairy creamer, 2 Tbsp  Margarine, 1 tsp  Salt, 1/4 tsp  Sugar, 1 tsp    LEVEL 4:  REGULAR FOODS  -Foods in this group are soft, moist, regularly textured foods.   -This level includes meat and breads, which tend to be the hardest things to swallow.   -Eat very slowly, chew well and continue to avoid carbonated drinks. -most people are at this level in 4-6 weeks  Hot Foods Cold Foods  Baked fish or skinned Soft cheeses - cottage cheese  Souffles and omelets Cream cheese  Eggs Yogurt  Stuffed shells Milk  Spaghetti with meat sauce Milkshakes  Cooked cereal Cold dry cereals (no nuts, dried fruit, coconut)  [place] toast or pancakes Crackers  Buttered toast Fruit juices or nectars  Noodles or pasta (no rice) Canned fruit  Potatoes (all types) Ripe bananas  Soft, cooked vegetables (no corn, lima, or baked beans) Peeled, ripe, fresh fruit  Creamed soups or vegetable soup Cakes (no nuts, dried fruit, coconut)  Canned chicken noodle soup Plain doughnuts  Gravies Ice cream  Bacon dressing Pudding, mousse, custard  Sauces:  Cheese, creamed, barbecue, tomato, white Fruit ice, [place] ice, sherbet  Decaffeinated tea or coffee Whipped gelatin  Pork chops Regular gelatin   Canned fruited  gelatin molds   Sugar, syrup, honey, jam, jelly   Cream   Non-dairy   Margarine   Oil   Mayonnaise   Ketchup   Mustard   TROUBLESHOOTING IRREGULAR BOWELS  1) Avoid extremes of bowel  movements (no bad constipation/diarrhea)  °2) Miralax 17gm mixed in 8oz. water or juice-daily. May use BID as needed.  °3) Gas-x,Phazyme, etc. as needed for gas & bloating.  °4) Soft,bland diet. No spicy,greasy,fried foods.  °5) Prilosec over-the-counter as needed  °6) May hold gluten/wheat products from diet to see if symptoms improve.  °7) May try probiotics (Align, Activa, etc) to help calm the bowels down  °7) If symptoms become worse call back immediately. ° ° ° °If you have any questions please call our office at [facility]: [phone]. ° °

## 2017-05-25 NOTE — Anesthesia Postprocedure Evaluation (Signed)
Anesthesia Post Note  Patient: [name]  Procedure(s) Performed: Procedure(s) (LRB): XI ROBOTIC HIATAL HERNIA REPAIR AND NISSEN FUNDOPLICATION (N/A) INSERTION OF MESH (N/A)     Patient location during evaluation: PACU Anesthesia Type: General Level of consciousness: awake and alert Pain management: pain level controlled Vital Signs Assessment: post-procedure vital signs reviewed and stable Respiratory status: spontaneous breathing, nonlabored ventilation, respiratory function stable and patient connected to nasal cannula oxygen Cardiovascular status: blood pressure returned to baseline and stable Postop Assessment: no signs of nausea or vomiting Anesthetic complications: no    Last Vitals:  Vitals:   05/25/17 1445 05/25/17 1500  BP: 140/62 (!) 141/65  Pulse: 71 70  Resp: (!) 22 (!) 21  Temp:  (!) 36.3 C    Last Pain:  Vitals:   05/25/17 1500  TempSrc:   PainSc: 0-No pain                 Tomoya Ringwald,W. EDMOND

## 2017-05-25 NOTE — H&P (View-Only) (Signed)
History of Present Illness Natasha Ok MD; 04/26/2017 11:43 AM) The patient is a 83 year old female who presents with a hiatal hernia. The patient is a 83 year old female who is referred by [name] for evaluation of a large hiatal hernia. Patient states that she's had a long history of A. fib. She is currently on Eliquis. She sees [name].  Patient states that she's had some dysphagia as well as reflux. Patient was diagnosed with a however hernia large hiatal hernia on upper GI. Patient also has CT scan which revealed a large approximate 5 cm hiatal hernia with hernia sac on the right side. Patient states that the dysphagia occurs with both solids and liquids. She does state that she is lost prostate 5 pounds recently and feels that her dysphagia and reflux is less burdensome. She denies any water brash, emesis after eating, bloating, all other ROS negative.  Of note the patient states that she's had a previous hysterectomy with a lower midline incision.   Past Surgical History [name], [place]; 04/26/2017 11:16 AM) Breast Biopsy  Right. Breast Mass; Local Excision  Right. Cataract Surgery  Bilateral. Hysterectomy (due to cancer) - Partial  Knee Surgery  Left. Oral Surgery  Spinal Surgery - Lower Back  Thyroid Surgery  Tonsillectomy   Diagnostic Studies History [name], [place]; 04/26/2017 11:16 AM) Mammogram  1-3 years ago Pap Smear  >5 years ago  Allergies [name], RMA; 04/26/2017 11:18 AM) Penicillins  Itching, Rash.  Social History [name], [place]; 04/26/2017 11:17 AM) Caffeine use  Coffee, Tea. No alcohol use  No drug use  Tobacco use  Never smoker.  Family History [name], [place]; 04/26/2017 11:16 AM) Breast Cancer  Sister. Colon Cancer  Sister. Diabetes Mellitus  Brother, Sister, Son. Heart Disease  Brother, Daughter, Sister. Hypertension  Brother, Daughter, Son. Ischemic Bowel Disease   Sister. Malignant Neoplasm Of Pancreas  Sister. Migraine Headache  Daughter. Rectal Cancer  Sister. Thyroid problems  Sister.  Pregnancy / Birth History [name], [place]; 04/26/2017 11:17 AM) Age at menarche  4 years. Age of menopause  <47 Gravida  1 Maternal age  17-20 Para  2  Other Problems [name], [place]; 04/26/2017 11:17 AM) Anxiety Disorder  Arthritis  Atrial Fibrillation  Breast Cancer  Cervical Cancer  Diabetes Mellitus  Gastroesophageal Reflux Disease  Heart murmur  Hemorrhoids  High blood pressure  Sleep Apnea  Thyroid Cancer  Thyroid Disease     Review of Systems [name] RMA; 04/26/2017 11:17 AM) Skin Present- Dryness. Not Present- Change in Wart/Mole, Hives, Jaundice, New Lesions, Non-Healing Wounds, Rash and Ulcer. HEENT Present- Seasonal Allergies, Sore Throat and Wears glasses/contact lenses. Not Present- Earache, Hearing Loss, Hoarseness, Nose Bleed, Oral Ulcers, Ringing in the Ears, Sinus Pain, Visual Disturbances and Yellow Eyes. Respiratory Present- Chronic Cough and Wheezing. Not Present- Bloody sputum, Difficulty Breathing and Snoring. Cardiovascular Present- Difficulty Breathing Lying Down and Swelling of Extremities. Not Present- Chest Pain, Leg Cramps, Palpitations, Rapid Heart Rate and Shortness of Breath. Gastrointestinal Present- Change in Bowel Habits, Constipation and Hemorrhoids. Not Present- Abdominal Pain, Bloating, Bloody Stool, Chronic diarrhea, Difficulty Swallowing, Excessive gas, Gets full quickly at meals, Indigestion, Nausea, Rectal Pain and Vomiting. Female Genitourinary Not Present- Frequency, Nocturia, Painful Urination, Pelvic Pain and Urgency. Musculoskeletal Present- Joint Pain. Not Present- Back Pain, Joint Stiffness, Muscle Pain, Muscle Weakness and Swelling of Extremities. Neurological Present- Decreased Memory. Not Present- Fainting, Headaches, Numbness, Seizures, Tingling, Tremor,  Trouble walking and Weakness. Psychiatric Present-  Anxiety. Not Present- Bipolar, Change in Sleep Pattern, Depression, Fearful and Frequent crying. Endocrine Present- Hair Changes. Not Present- Cold Intolerance, Excessive Hunger, Heat Intolerance, Hot flashes and New Diabetes. Hematology Present- Blood Thinners and Easy Bruising. Not Present- Excessive bleeding, Gland problems, HIV and Persistent Infections.  Vitals [name] RMA; 04/26/2017 11:19 AM) 04/26/2017 11:18 AM Weight: 188.4 lb Height: 66in Body Surface Area: 1.95 m Body Mass Index: 30.41 kg/m  Temp.: 97.26F  Pulse: 73 (Regular)  BP: 150/80 (Sitting, Left Arm, Standard)       Assessment & Plan Natasha Ok MD; 04/26/2017 11:45 AM) HIATAL HERNIA WITH GERD (K21.9) Impression: Patient is an 83 year old female with a large hiatal hernia and GERD. Patient has A. fib. 1. Will obtain cardiac clearance and be okay to be autologous from [name]. 2. We will proceed to the operating room for robotic hiatal hernia repair and Nissen fundoplication. 3. I discussed with her the risks and benefits of the procedure to include but not limited to: Infection, bleeding, damage to structures, possible pneumothorax, possible esophageal injury, Possibly further surgery, and recurrence. Patient was understanding and wished to proceed.

## 2017-05-26 ENCOUNTER — Encounter (HOSPITAL_COMMUNITY): Payer: Self-pay | Admitting: Surgery

## 2017-05-26 ENCOUNTER — Inpatient Hospital Stay (HOSPITAL_COMMUNITY): Payer: Medicare Other

## 2017-05-26 LAB — BASIC METABOLIC PANEL
ANION GAP: 7 (ref 5–15)
BUN: 19 mg/dL (ref 6–20)
CALCIUM: 8.4 mg/dL — AB (ref 8.9–10.3)
CO2: 26 mmol/L (ref 22–32)
Chloride: 105 mmol/L (ref 101–111)
Creatinine, Ser: 0.62 mg/dL (ref 0.44–1.00)
Glucose, Bld: 186 mg/dL — ABNORMAL HIGH (ref 65–99)
Potassium: 4 mmol/L (ref 3.5–5.1)
SODIUM: 138 mmol/L (ref 135–145)

## 2017-05-26 LAB — GLUCOSE, CAPILLARY
GLUCOSE-CAPILLARY: 181 mg/dL — AB (ref 65–99)
GLUCOSE-CAPILLARY: 169 mg/dL — AB (ref 65–99)
Glucose-Capillary: 192 mg/dL — ABNORMAL HIGH (ref 65–99)
Glucose-Capillary: 196 mg/dL — ABNORMAL HIGH (ref 65–99)
Glucose-Capillary: 203 mg/dL — ABNORMAL HIGH (ref 65–99)

## 2017-05-26 MED ORDER — DILTIAZEM HCL 90 MG PO TABS
90.0000 mg | ORAL_TABLET | Freq: Four times a day (QID) | ORAL | Status: DC
  Administered 2017-05-26 – 2017-05-28 (×9): 90 mg via ORAL
  Filled 2017-05-26 (×10): qty 1

## 2017-05-26 MED ORDER — IOPAMIDOL (ISOVUE-300) INJECTION 61%
50.0000 mL | Freq: Once | INTRAVENOUS | Status: AC | PRN
  Administered 2017-05-26: 100 mL via ORAL

## 2017-05-26 MED ORDER — IOPAMIDOL (ISOVUE-300) INJECTION 61%
INTRAVENOUS | Status: AC
  Filled 2017-05-26: qty 100

## 2017-05-26 MED ORDER — DILTIAZEM HCL ER COATED BEADS 180 MG PO CP24
360.0000 mg | ORAL_CAPSULE | Freq: Every day | ORAL | Status: DC
  Filled 2017-05-26: qty 2

## 2017-05-26 MED ORDER — METFORMIN HCL ER 500 MG PO TB24: 500.0000 mg | ORAL_TABLET | Freq: Every day | ORAL | Status: DC

## 2017-05-26 MED ORDER — METFORMIN HCL 500 MG PO TABS
500.0000 mg | ORAL_TABLET | Freq: Every day | ORAL | Status: DC
  Administered 2017-05-26 – 2017-05-28 (×3): 500 mg via ORAL
  Filled 2017-05-26 (×3): qty 1

## 2017-05-26 MED ORDER — LEVOTHYROXINE SODIUM 75 MCG PO TABS
150.0000 ug | ORAL_TABLET | Freq: Every day | ORAL | Status: DC
  Administered 2017-05-27 – 2017-05-28 (×2): 150 ug via ORAL
  Filled 2017-05-26 (×2): qty 2

## 2017-05-26 MED ORDER — AMIODARONE HCL 200 MG PO TABS
200.0000 mg | ORAL_TABLET | Freq: Every day | ORAL | Status: DC
  Administered 2017-05-26 – 2017-05-28 (×3): 200 mg via ORAL
  Filled 2017-05-26 (×3): qty 1

## 2017-05-26 MED ORDER — IOPAMIDOL (ISOVUE-300) INJECTION 61%: 50.0000 mL | Freq: Once | INTRAVENOUS | Status: DC | PRN

## 2017-05-26 NOTE — Progress Notes (Signed)
[facility] Office:  [phone] General Surgery Progress Note   LOS: 1 day  POD -  1 Day Post-Op  Chief Complaint: Hiatal hernia  Assessment and Plan: 1.  XI ROBOTIC HIATAL HERNIA REPAIR AND NISSEN FUNDOPLICATION, INSERTION OF MESH - 05/25/2017 - Rosendo Gros  Looks good  For swallow today   2.  A fib  Followed by [name] 3.  On Eliquis 4.  DVT prophylaxis - Lovenox   Active Problems:   S/P Nissen fundoplication (without gastrostomy tube) procedure  Subjective:  Doing well.  No nausea.  She thought she was staying until Monday.  Objective:   Vitals:   05/25/17 2244 05/26/17 0520  BP: (!) 142/69 140/67  Pulse: 63 64  Resp: 16 18  Temp: 97.7 F (36.5 C) 98.1 F (36.7 C)     Intake/Output from previous day:  07/13 0701 - 07/14 0700 In: 3175 [I.V.:3175] Out: 400 [Urine:350; Blood:50]  Intake/Output this shift:  No intake/output data recorded.   Physical Exam:   General: Older F who is alert and oriented.    HEENT: Normal. Pupils equal. .   Lungs: Clear   Abdomen: Soft   Wound: Clean   Lab Results:   No results for input(s): WBC, HGB, HCT, PLT in the last 72 hours.  BMET   Recent Labs  05/26/17 0516  NA 138  K 4.0  CL 105  CO2 26  GLUCOSE 186*  BUN 19  CREATININE 0.62  CALCIUM 8.4*    PT/INR  No results for input(s): LABPROT, INR in the last 72 hours.  ABG  No results for input(s): PHART, HCO3 in the last 72 hours.  Invalid input(s): PCO2, PO2   Studies/Results:  No results found.   Anti-infectives:   Anti-infectives    Start     Dose/Rate Route Frequency Ordered Stop   05/25/17 0725  vancomycin (VANCOCIN) IVPB 1000 mg/200 mL premix     1,000 mg 200 mL/hr over 60 Minutes Intravenous 60 min pre-op 05/25/17 0725 05/25/17 1659      Alphonsa Overall, MD, FACS Pager: [facility] Office: [phone] 05/26/2017

## 2017-05-27 LAB — GLUCOSE, CAPILLARY
GLUCOSE-CAPILLARY: 191 mg/dL — AB (ref 65–99)
Glucose-Capillary: 215 mg/dL — ABNORMAL HIGH (ref 65–99)
Glucose-Capillary: 167 mg/dL — ABNORMAL HIGH (ref 65–99)
Glucose-Capillary: 199 mg/dL — ABNORMAL HIGH (ref 65–99)

## 2017-05-27 NOTE — Progress Notes (Addendum)
[place] [facility] Office:  [phone] General Surgery Progress Note   LOS: 2 days  POD -  2 Days Post-Op  Chief Complaint: Hiatal hernia  Assessment and Plan: 1.  XI ROBOTIC HIATAL HERNIA REPAIR AND NISSEN FUNDOPLICATION, INSERTION OF MESH - 05/25/2017 - Rosendo Gros  Looks good  Swallow okay, just a little tight.  She did okay with the clear liquids, but felt that the liquids held up some.  Will keep on clears today (advance if she does better) and keep until tomorrow.  2.  A fib  Followed by [name] 3.  On Eliquis 4.  OSA - on nasal CPAP 5.  DVT prophylaxis - Lovenox   Principal Problem:   Hiatal hernia s/p robotic repair & fundoplication 7/84/6962 Active Problems:   HYPERCHOLESTEROLEMIA   Anxiety state   Essential hypertension   GERD   Irritable bowel syndrome   Diabetes mellitus without complication (HCC)   Anticoagulated by anticoagulation treatment - Eliquis   Persistent atrial fibrillation (HCC)   S/P Nissen fundoplication (without gastrostomy tube) procedure  Subjective:  Anxious.  The clear liquids held up some.  No vomiting.  Has "gassey" discomfort.  Objective:   Vitals:   05/26/17 2201 05/27/17 0625  BP: (!) 155/66 (!) 165/82  Pulse: 62 78  Resp: 20 20  Temp: 98.3 F (36.8 C) 98.4 F (36.9 C)     Intake/Output from previous day:  07/14 0701 - 07/15 0700 In: 2300 [I.V.:2300] Out: 500 [Urine:500]  Intake/Output this shift:  No intake/output data recorded.   Physical Exam:   General: Older F who is alert and oriented.  Somewhat more anxious today.   HEENT: Normal. Pupils equal. .   Lungs: Clear   Abdomen: Soft, has BS.   Wound: Clean   Lab Results:   No results for input(s): WBC, HGB, HCT, PLT in the last 72 hours.  BMET    Recent Labs  05/26/17 0516  NA 138  K 4.0  CL 105  CO2 26  GLUCOSE 186*  BUN 19  CREATININE 0.62  CALCIUM 8.4*    PT/INR  No results for input(s): LABPROT, INR in the last 72 hours.  ABG  No results  for input(s): PHART, HCO3 in the last 72 hours.  Invalid input(s): PCO2, PO2   Studies/Results:  Dg Esophagus W/water Sol Cm  Result Date: 05/26/2017 CLINICAL DATA:  Nissen fundoplication EXAM: ESOPHOGRAM/BARIUM SWALLOW TECHNIQUE: Single contrast examination was performed using water-soluble contrast. FLUOROSCOPY TIME:  1 minutes and 54 seconds.  42.9 mGy COMPARISON:  None. FINDINGS: The esophagus is dilated. Contrast pools in the distal esophagus with an air-fluid level. At the gastroesophageal junction, there is marked narrowing with the appearance of a beak. This configuration suggest achalasia. A trickle of contrast traverses across the gastroesophageal junction into the stomach. There is no evidence of extravasated contrast. IMPRESSION: There is delayed transit of contrast from the esophagus into the stomach. The appearance suggest achalasia. Alternatively, this may simply represent edema since the patient had surgery yesterday. Consider short-term follow-up esophagram. Electronically Signed   By: Marybelle Killings M.D.   On: 05/26/2017 10:04     Anti-infectives:   Anti-infectives    Start     Dose/Rate Route Frequency Ordered Stop   05/25/17 0725  vancomycin (VANCOCIN) IVPB 1000 mg/200 mL premix     1,000 mg 200 mL/hr over 60 Minutes Intravenous 60 min pre-op 05/25/17 0725 05/25/17 1659      Alphonsa Overall, MD, FACS Pager: [place]  [facility] Office: [phone] 05/27/2017

## 2017-05-28 LAB — GLUCOSE, CAPILLARY
GLUCOSE-CAPILLARY: 172 mg/dL — AB (ref 65–99)
Glucose-Capillary: 163 mg/dL — ABNORMAL HIGH (ref 65–99)

## 2017-05-28 MED ORDER — HYDROCODONE-ACETAMINOPHEN 7.5-325 MG/15ML PO SOLN: 15.0000 mL | Freq: Four times a day (QID) | ORAL | 0 refills | Status: DC | PRN

## 2017-05-28 MED ORDER — PREMIER PROTEIN SHAKE
2.0000 [oz_av] | Freq: Four times a day (QID) | ORAL | Status: DC
  Administered 2017-05-28: 2 [oz_av] via ORAL

## 2017-05-28 NOTE — Progress Notes (Signed)
Discharge instructions discussed with patient. Patient able to answer questions about crushing meds and followup visits.

## 2017-05-28 NOTE — Discharge Summary (Signed)
Physician Discharge Summary  Patient ID: [name] MRN: [id number] DOB/AGE: 1931/02/18 83 y.o.  Admit date: 05/25/2017 Discharge date: 05/28/2017  Admission Diagnoses:Status post robotic hiatal hernia repair with mesh and Nissen fundoplication  Discharge Diagnoses:  Principal Problem:   Hiatal hernia s/p robotic repair & fundoplication 0/71/2197 Active Problems:   HYPERCHOLESTEROLEMIA   Anxiety state   Essential hypertension   GERD   Irritable bowel syndrome   Diabetes mellitus without complication (HCC)   Anticoagulated by anticoagulation treatment - Eliquis   Persistent atrial fibrillation (HCC)   S/P Nissen fundoplication (without gastrostomy tube) procedure   Discharged Condition: good  Hospital Course: Patient is an 83 year old female who underwent robotic hiatal hernia repair with mesh and Nissen fundoplication. Please see operative note for details.  Postoperatively patient was sent to the floor. Postoperative day one patient went esophagogram study to evaluate the esophagus for injury. This was negative for injury. Patient was started on liquid diet. Patient was otherwise and doing well on her own. Should give pain control. Home medications were restarted. Patient was having some difficulty with liquids on postoperative day 2. She states secondary to this.  Postoperative day 3 the patient was otherwise afebrile, was ambulating well on her own, had good pain control. She was deemed stable for discharge and discharged home.  Consults: None  Significant Diagnostic Studies: Esophagogram study: Delayed esophageal clearance likely secondary to edema  Treatments: surgery: As above  Discharge Exam: Blood pressure (!) 165/85, pulse 66, temperature 98.7 F (37.1 C), temperature source Oral, resp. rate 20, height 5\' 6"  (1.676 m), weight 85.3 kg (188 lb), SpO2 90 %. General appearance: alert and cooperative Cardio: regular rate and rhythm, S1, S2 normal, no murmur, click,  rub or gallop GI: soft, non-tender; bowel sounds normal; no masses,  no organomegaly  Disposition: 01-Home or Self Care    Current Facility-Administered Medications (Endocrine & Metabolic):  .  insulin aspart (novoLOG) injection 0-15 Units .  levothyroxine (SYNTHROID, LEVOTHROID) tablet 150 mcg .  metFORMIN (GLUCOPHAGE) tablet 500 mg   Current Facility-Administered Medications (Cardiovascular):  .  amiodarone (PACERONE) tablet 200 mg .  diltiazem (CARDIZEM) tablet 90 mg     Current Facility-Administered Medications (Analgesics):  .  HYDROmorphone (DILAUDID) injection 1-2 mg  Current Outpatient Prescriptions (Analgesics):  .  HYDROcodone-acetaminophen (HYCET) 7.5-325 mg/15 ml solution, Take 15 mLs by mouth 4 (four) times daily as needed for moderate pain.  Current Facility-Administered Medications (Hematological):  .  enoxaparin (LOVENOX) injection 40 mg   Current Facility-Administered Medications (Other):  .  chlorhexidine (PERIDEX) 0.12 % solution 15 mL .  dextrose 5 %-0.9 % sodium chloride infusion .  MEDLINE mouth rinse .  ondansetron (ZOFRAN-ODT) disintegrating tablet 4 mg **OR** ondansetron (ZOFRAN) injection 4 mg .  ondansetron (ZOFRAN) injection 4 mg    Follow-up Information    Ralene Ok, MD. Schedule an appointment as soon as possible for a visit in 2 week(s).   Specialty:  General Surgery Why:  For wound re-check Contact information: [address] [place] [address] [phone]           Signed: Rosario Jacks., [facility] 05/28/2017, 6:29 AM

## 2017-05-28 NOTE — Plan of Care (Signed)
Problem: Food- and Nutrition-Related Knowledge Deficit (NB-1.1) Goal: Nutrition education Formal process to instruct or train a patient/client in a skill or to impart knowledge to help patients/clients voluntarily manage or modify food choices and eating behavior to maintain or improve health. Outcome: Completed/Met Date Met: 05/28/17 Nutrition Education Note  RD consulted for nutrition education regarding patient who is s/p nissen fundoplication.  RD provided handout on Nissen Fundoplication nutrition therapy. Reviewed clear and full liquids. Encouraged pt to avoid caffeine, carbonated beverages and use of straws.  Provided examples of appropriate foods on a soft diet. Reviewed gas producing foods. Discouraged intake of processed foods and red meat. Recommended use of a liquid multivitamin while following a liquid diet. Reviewed acceptable protein supplements.  RD discussed why it is important for patient to adhere to diet recommendations. Teach back method used.  Expect good compliance. Reviewed diet with son present.  Body mass index is 30.34 kg/m.  Pt meets criteria for obesity based on current BMI.  Current diet order is full liquid. Labs and medications reviewed. No further nutrition interventions warranted at this time. If additional nutrition issues arise, please re-consult RD.   Clayton Bibles, MS, RD, LDN Pager: [phone] After Hours Pager: [phone]

## 2017-05-31 ENCOUNTER — Telehealth: Payer: Self-pay | Admitting: Cardiovascular Disease

## 2017-05-31 NOTE — Telephone Encounter (Signed)
Pt c/o BP issue: STAT if pt c/o blurred vision, one-sided weakness or slurred speech  1. What are your last 5 BP readings? 179/100  2. Are you having any other symptoms (ex. Dizziness, headache, blurred vision, passed out)? Woozy, weak, hurting a little around my heart   3. What is your BP issue? High

## 2017-05-31 NOTE — Telephone Encounter (Signed)
Spoke with pt and she states her BP is 179/100.  Pt audibly very SOB on the phone.  C/o weakness, dizziness and her "heart hurting".  Had hernia repair on 05/25/17.  Pt wanted to know about taking Losartan to help bring BP down.  Advised pt she needed to report to ER for evaluation/possible concern for clot.  Pt adamant that I speak to someone in the office.  Spoke with Dr. Radford Pax, DOD and she agreed that pt needed to report to ER immediately via EMS.  Advised pt to call 911 now and get transferred to hospital.  Pt verbalized understanding and was in agreement with this plan.

## 2017-06-06 NOTE — Telephone Encounter (Signed)
I spoke with the pt because she did not go to the ER as advised last week.  The pt is SOB on the phone, BP 157/90, pulse 80.  The pt states she had losartan at home and she restarted this medication last week after BP was elevated.  I advised the pt that it is best for her to be evaluated and I have scheduled an appointment tomorrow with Cecilie Kicks NP. Pt agreed with plan.

## 2017-06-06 NOTE — Progress Notes (Signed)
Cardiology Office Note   Date:  06/07/2017   ID:  [name], DOB [birth year], MRN [id number]  PCP:  Lavone Orn, MD  Cardiologist:  [name]     Chief Complaint  Patient presents with  . Hypertension      History of Present Illness: [name] is a 83 y.o. female who presents for HTN.    Pt with hx PAF maintaining SR on amiodarone.  She's had multiple cardioversions and has been treated with amiodarone and cardizem, anticoagulated with apixaban.  Also with htn, hld and DM-2.    Per Dr Antionette Char note "200 mg daily of amiodarone until she gets through surgery, then we can slowly reduce her dose to 100 mg daily once she recovers from surgery. We gave her specific instructions to slowly reduce amiodarone over a few months. She will continue on Eliquis for anticoagulation"   She is now recovering from Hiatal hernia s/p robotic repair & fundoplication 6/81/2751.  She had a lot of pain post procedure and was taking tylenol 1350 mg every 8 hours.  She was told she could not get refill of pain meds.   She has since decreased tylenol to arthritis strength every 8 hours.   Her BP has been elevated with this recovery was also elevated in hospital.  She has taken some of her old losartan in last few days.  That was hyzaar 50/12.5.  She has taken a half a tablet to one tablet.   She has complained of leg swelling as well. She believes this has improved with taking the BP meds.  She continues on amiodarone and dilt.  Also on eliquis.    Some SOB when talking on the phone.  No chest pain.  Her diet is cream of wheat and jello.  She can advance in a week or so, to see surgery back 06/21/17.   No palpations no rapid HR.   Past Medical History:  Diagnosis Date  . ADENOCARCINOMA, BREAST 10/13/2010   Qualifier: Diagnosis of  By: Nils Pyle CMA (AAMA), Mearl Latin    . Allergy    SEASONAL  . Anemia    years ago  . Arthritis    "all my joints" (03/31/2014)  . Atrial fibrillation ([place])   . Atrial  fibrillation with RVR ([place]) 03/31/2015   DLCO dropped from 100% in 2015-50% in 01/2017  . Barrett's esophagus   . Breast cancer [facility])    s/p right breast lumpectomy  . Cervical cancer ([place])   . DJD (degenerative joint disease) of knee   . Dysrhythmia    Afib  . GERD (gastroesophageal reflux disease)   . Glaucoma   . H/O hiatal hernia   . Heart murmur   . Hx of transesophageal echocardiography (TEE) for monitoring    TEE (03/2014): No LAA clot, moderate LAE, core triatriatum type structure in RA with no stenosis, normal EF 55-60%, mild MR  . Hypertension   . Hypothyroidism, postsurgical   . Migraine    "last one was years ago" (03/31/2014)  . OSA on CPAP    settings at 10-11   . RECTAL FISSURE 10/13/2010   Qualifier: Diagnosis of  By: Nils Pyle CMA ([place]), Mearl Latin    . Sleep apnea   . Thyroid cancer [facility])    s/p thyroidectomy  . Type II diabetes mellitus ([place])    type II     Past Surgical History:  Procedure Laterality Date  . ABDOMINAL HYSTERECTOMY     "partial"  . BREAST  BIOPSY Right   . BREAST LUMPECTOMY Right   . CARDIOVERSION N/A 03/30/2014   Procedure: CARDIOVERSION - BEDSIDE;  Surgeon: [name], MD;  Location: [place];  Service: Cardiovascular;  Laterality: N/A;  . CARDIOVERSION N/A 03/31/2014   Procedure: CARDIOVERSION  (BEDSIDE) ;  Surgeon: Lelon Perla, MD;  Location: [place];  Service: Cardiovascular;  Laterality: N/A;  . CARDIOVERSION N/A 04/27/2014   Procedure: CARDIOVERSION;  Surgeon: Darlin Coco, MD;  Location: [facility] ENDOSCOPY;  Service: Cardiovascular;  Laterality: N/A;  . CARDIOVERSION N/A 04/13/2015   Procedure: CARDIOVERSION;  Surgeon: Thayer Headings, MD;  Location: [place];  Service: Cardiovascular;  Laterality: N/A;  . CARDIOVERSION N/A 01/08/2017   Procedure: CARDIOVERSION;  Surgeon: Sanda Klein, MD;  Location: [facility] ENDOSCOPY;  Service: Cardiovascular;  Laterality: N/A;  . CARPAL TUNNEL RELEASE Right   . CATARACT EXTRACTION W/ INTRAOCULAR LENS   IMPLANT, BILATERAL Bilateral   . COLONOSCOPY    . EXCISIONAL HEMORRHOIDECTOMY    . INSERTION OF MESH N/A 05/25/2017   Procedure: INSERTION OF MESH;  Surgeon: Ralene Ok, MD;  Location: [facility];  Service: General;  Laterality: N/A;  . TEE WITHOUT CARDIOVERSION N/A 03/30/2014   Procedure: TRANSESOPHAGEAL ECHOCARDIOGRAM (TEE);  Surgeon: [name], MD;  Location: [facility] ENDOSCOPY;  Service: Cardiovascular;  Laterality: N/A;  . THYROIDECTOMY    . TONSILLECTOMY       Current Outpatient Prescriptions  Medication Sig Dispense Refill  . acetaminophen (TYLENOL) 650 MG CR tablet Take 1,300 mg by mouth every 8 (eight) hours.    Marland Kitchen amiodarone (PACERONE) 200 MG tablet TAKE 1 TABLET BY MOUTH  DAILY 90 tablet 2  . calcium citrate-vitamin D (CITRACAL+D) 315-200 MG-UNIT tablet Take 1 tablet by mouth 2 (two) times daily.     . Cyanocobalamin (B-12 PO) Take 1 tablet by mouth daily as needed (for energy).     Marland Kitchen diltiazem (CARDIZEM CD) 360 MG 24 hr capsule TAKE 1 CAPSULE BY MOUTH  DAILY 90 capsule 3  . ELIQUIS 5 MG TABS tablet TAKE 1 TABLET BY MOUTH TWO  TIMES DAILY 180 tablet 3  . ferrous sulfate 325 (65 FE) MG tablet Take 325 mg by mouth daily with breakfast.    . fluticasone (FLONASE) 50 MCG/ACT nasal spray Place 1 spray into both nostrils daily as needed (for allergies.). Reported on 02/25/2016    . lansoprazole (PREVACID) 30 MG capsule Take 1 capsule (30 mg total) by mouth 2 (two) times daily before a meal. 180 capsule 2  . levothyroxine (SYNTHROID, LEVOTHROID) 175 MCG tablet Take 175 mcg by mouth daily before breakfast.    . LORazepam (ATIVAN) 1 MG tablet Take 1.5 mg by mouth at bedtime.    . Lutein 6 MG CAPS Take 6 mg by mouth 2 (two) times daily as needed (for vision support).     . magnesium oxide (MAG-OX) 400 MG tablet Take 400 mg by mouth 2 (two) times daily.    . metFORMIN (GLUMETZA) 500 MG (MOD) 24 hr tablet Take 500 mg by mouth 2 (two) times daily with a meal.     . Multiple Vitamins-Minerals  (OCUVITE PRESERVISION PO) Take 1 tablet by mouth daily.    . ONE TOUCH ULTRA TEST test strip 1 each by Other route 3 (three) times daily. Use as directed    . ONETOUCH DELICA LANCETS 10G MISC 1 each by Other route 3 (three) times daily. Use as directed    . Polyvinyl Alcohol-Povidone PF (REFRESH) 1.4-0.6 % SOLN Place 1  drop into both eyes at bedtime.    . potassium chloride (KLOR-CON) 20 MEQ packet Take by mouth 3 (three) times daily.    . rosuvastatin (CRESTOR) 20 MG tablet Take 10 mg by mouth at bedtime.    . sertraline (ZOLOFT) 50 MG tablet Take 50 mg by mouth daily.    . hydrochlorothiazide (MICROZIDE) 12.5 MG capsule Take 1 capsule (12.5 mg total) by mouth daily. 90 capsule 3  . losartan (COZAAR) 25 MG tablet Take 1 tablet (25 mg total) by mouth daily. 90 tablet 3   No current facility-administered medications for this visit.     Allergies:   Penicillins    Social History:  The patient  reports that she has never smoked. She has never used smokeless tobacco. She reports that she does not drink alcohol or use drugs.   Family History:  The patient's family history includes Diabetes in her brother, brother, brother, brother, brother, brother, brother, sister, sister, and sister; Heart attack in her brother; Heart disease in her brother, brother, brother, brother, brother, brother, and sister; Pancreatic cancer in her sister; Prostate cancer in her father.    ROS:  General:no colds or fevers, + weight loss due to surgery Skin:no rashes or ulcers HEENT:no blurred vision, no congestion CV:see HPI PUL:see HPI GI:no diarrhea constipation or melena, no indigestion GU:no hematuria, no dysuria MS:no joint pain, no claudication, some lower ext edema  Neuro:no syncope, no lightheadedness Endo:+ diabetes, + thyroid disease  Wt Readings from Last 3 Encounters:  06/07/17 175 lb 6.4 oz (79.6 kg)  05/25/17 188 lb (85.3 kg)  05/22/17 188 lb (85.3 kg)     PHYSICAL EXAM: VS:  BP 126/70    Pulse 66   Ht 5\' 6"  (1.676 m)   Wt 175 lb 6.4 oz (79.6 kg)   LMP  (LMP Unknown)   BMI 28.31 kg/m  , BMI Body mass index is 28.31 kg/m. General:Pleasant affect, NAD Skin:Warm and dry, brisk capillary refill HEENT:normocephalic, sclera clear, mucus membranes moist Neck:supple, no JVD, no bruits  Heart:S1S2 RRR with 2/6 systolic murmur, no gallup, rub or click Lungs:clear without rales, rhonchi, or wheezes NFA:OZHY, non tender, + BS, do not palpate liver spleen or masses Ext:tr to 1+ lower ext edema, 2+ pedal pulses, 2+ radial pulses Neuro:alert and oriented X 3, MAE, follows commands, + facial symmetry    EKG:  EKG is ordered today. The ekg ordered today demonstrates SR normal EKG.    Recent Labs: 11/01/2016: ALT 14 12/27/2016: TSH 4.710 05/22/2017: Hemoglobin 12.2; Platelets 152 05/26/2017: BUN 19; Creatinine, Ser 0.62; Potassium 4.0; Sodium 138    Lipid Panel No results found for: CHOL, TRIG, HDL, CHOLHDL, VLDL, LDLCALC, LDLDIRECT     Other studies Reviewed: Additional studies/ records that were reviewed today include: . Echo 2016  Study Conclusions  - Left ventricle: The cavity size was normal. There was severe   focal basal and moderate concentric hypertrophy of the left   ventricle. Systolic function was vigorous. The estimated ejection   fraction was in the range of 65% to 70%. Wall motion was normal;   there were no regional wall motion abnormalities. Features are   consistent with a pseudonormal left ventricular filling pattern,   with concomitant abnormal relaxation and increased filling   pressure (grade 2 diastolic dysfunction). Doppler parameters are   consistent with elevated ventricular end-diastolic filling   pressure. - Aortic valve: There was mild regurgitation. - Aortic root: The aortic root was normal in size. -  Left atrium: The atrium was moderately dilated. - Right ventricle: Systolic function was normal. - Right atrium: The atrium was normal in  size. - Tricuspid valve: There was mild regurgitation. - Pulmonic valve: There was no regurgitation. - Pulmonary arteries: Systolic pressure was within the normal   range. PA peak pressure: 31 mm Hg (S). - Inferior vena cava: The vessel was normal in size. - Pericardium, extracardiac: There was no pericardial effusion.   ASSESSMENT AND PLAN:  1.  HTN -has been elevated since surgery and with her diastolic HF has increased volume.  She resumed her losartan hctz and feels better, today BP has improved and edema of lower legs better.  No pain of lower ext.  Adding losartan 25 and HCTZ 12.5 daily.  Will check CMP today and magnesium.  She is on K+ supplement and Mg+ that she is not taking as ordered.  Follow up in 4 weeks to check BP and see if time to decrease amiodarone.   2.  Recent hx of HH hernia repair and s/p robotic repair & fundoplication 8/92/1194.  Still in recovery.    3. PAF maintaining SR with no awareness of irregular HR.  On Eliquis and taking  Continue amiodarone and dilt. If healed from surgery may decrease amiodarone to 100 mg daily   4.  Hypokalemia and hypomag+ check levels.  5. DM-2 per IM stable  6. HLD on crestor. continue     Current medicines are reviewed with the patient today.  The patient Has no concerns regarding medicines.  The following changes have been made:  See above Labs/ tests ordered today include:see above  Disposition:   FU:  see above  Signed, Cecilie Kicks, NP  06/07/2017 12:27 PM    [facility] [place], [place], [place], [place] Phone: [phone]; Fax: [phone]

## 2017-06-07 ENCOUNTER — Encounter (INDEPENDENT_AMBULATORY_CARE_PROVIDER_SITE_OTHER): Payer: Self-pay

## 2017-06-07 ENCOUNTER — Ambulatory Visit (INDEPENDENT_AMBULATORY_CARE_PROVIDER_SITE_OTHER): Payer: Medicare Other | Admitting: Cardiology

## 2017-06-07 ENCOUNTER — Encounter: Payer: Self-pay | Admitting: Cardiology

## 2017-06-07 VITALS — BP 126/70 | HR 66 | Ht 66.0 in | Wt 175.4 lb

## 2017-06-07 DIAGNOSIS — I10 Essential (primary) hypertension: Secondary | ICD-10-CM | POA: Diagnosis not present

## 2017-06-07 DIAGNOSIS — E782 Mixed hyperlipidemia: Secondary | ICD-10-CM

## 2017-06-07 DIAGNOSIS — Z7901 Long term (current) use of anticoagulants: Secondary | ICD-10-CM | POA: Diagnosis not present

## 2017-06-07 DIAGNOSIS — E119 Type 2 diabetes mellitus without complications: Secondary | ICD-10-CM

## 2017-06-07 DIAGNOSIS — E876 Hypokalemia: Secondary | ICD-10-CM

## 2017-06-07 DIAGNOSIS — I480 Paroxysmal atrial fibrillation: Secondary | ICD-10-CM

## 2017-06-07 LAB — COMPREHENSIVE METABOLIC PANEL
ALT: 20 IU/L (ref 0–32)
AST: 17 IU/L (ref 0–40)
Albumin/Globulin Ratio: 1.6 (ref 1.2–2.2)
Albumin: 4 g/dL (ref 3.5–4.7)
Alkaline Phosphatase: 93 IU/L (ref 39–117)
BUN/Creatinine Ratio: 22 (ref 12–28)
BUN: 18 mg/dL (ref 8–27)
Bilirubin Total: 0.3 mg/dL (ref 0.0–1.2)
CALCIUM: 9.7 mg/dL (ref 8.7–10.3)
CO2: 22 mmol/L (ref 20–29)
CREATININE: 0.82 mg/dL (ref 0.57–1.00)
Chloride: 103 mmol/L (ref 96–106)
GFR calc Af Amer: 76 mL/min/{1.73_m2} (ref >59)
GFR, EST NON AFRICAN AMERICAN: 66 mL/min/{1.73_m2} (ref >59)
GLOBULIN, TOTAL: 2.5 g/dL (ref 1.5–4.5)
GLUCOSE: 133 mg/dL — AB (ref 65–99)
Potassium: 4.3 mmol/L (ref 3.5–5.2)
Sodium: 142 mmol/L (ref 134–144)
Total Protein: 6.5 g/dL (ref 6.0–8.5)

## 2017-06-07 LAB — MAGNESIUM: Magnesium: 2.2 mg/dL (ref 1.6–2.3)

## 2017-06-07 MED ORDER — HYDROCHLOROTHIAZIDE 12.5 MG PO CAPS: 12.5000 mg | ORAL_CAPSULE | Freq: Every day | ORAL | 3 refills | Status: DC

## 2017-06-07 MED ORDER — LOSARTAN POTASSIUM 25 MG PO TABS: 25.0000 mg | ORAL_TABLET | Freq: Every day | ORAL | 3 refills | Status: DC

## 2017-06-07 NOTE — Patient Instructions (Addendum)
Medication Instructions:  Your physician has recommended you make the following change in your medication: 1.) Start Losartan 25 mg daily 2.) Start HCTZ 12.5 once daily.   Labwork: Your physician recommends that you return for lab work today for CMET, MAGNESIUM  Testing/Procedures: None Ordered   Follow-Up: Your physician recommends that you schedule a follow-up appointment in: one month with Cecilie Kicks, FNP-C  Any Other Special Instructions Will Be Listed Below (If Applicable).     If you need a refill on your cardiac medications before your next appointment, please call your pharmacy.

## 2017-06-08 ENCOUNTER — Other Ambulatory Visit: Payer: Self-pay | Admitting: *Deleted

## 2017-06-08 DIAGNOSIS — Z5181 Encounter for therapeutic drug level monitoring: Secondary | ICD-10-CM

## 2017-06-08 DIAGNOSIS — Z5189 Encounter for other specified aftercare: Principal | ICD-10-CM

## 2017-06-08 NOTE — Addendum Note (Signed)
Addended by: Hosie Poisson R on: 06/08/2017 01:34 PM   Modules accepted: Orders

## 2017-06-14 ENCOUNTER — Other Ambulatory Visit: Payer: Medicare Other | Admitting: *Deleted

## 2017-06-14 DIAGNOSIS — Z5189 Encounter for other specified aftercare: Principal | ICD-10-CM

## 2017-06-14 DIAGNOSIS — Z5181 Encounter for therapeutic drug level monitoring: Secondary | ICD-10-CM

## 2017-06-14 LAB — BASIC METABOLIC PANEL
BUN / CREAT RATIO: 21 (ref 12–28)
BUN: 18 mg/dL (ref 8–27)
CHLORIDE: 101 mmol/L (ref 96–106)
CO2: 21 mmol/L (ref 20–29)
Calcium: 9.2 mg/dL (ref 8.7–10.3)
Creatinine, Ser: 0.86 mg/dL (ref 0.57–1.00)
GFR calc non Af Amer: 62 mL/min/{1.73_m2} (ref >59)
GFR, EST AFRICAN AMERICAN: 72 mL/min/{1.73_m2} (ref >59)
Glucose: 179 mg/dL — ABNORMAL HIGH (ref 65–99)
POTASSIUM: 3.4 mmol/L — AB (ref 3.5–5.2)
Sodium: 141 mmol/L (ref 134–144)

## 2017-07-05 NOTE — Addendum Note (Signed)
Addendum  created 07/05/17 1106 by Roberts Gaudy, MD   Sign clinical note

## 2017-07-06 ENCOUNTER — Ambulatory Visit (INDEPENDENT_AMBULATORY_CARE_PROVIDER_SITE_OTHER): Payer: Medicare Other | Admitting: Cardiology

## 2017-07-06 ENCOUNTER — Encounter: Payer: Self-pay | Admitting: Cardiology

## 2017-07-06 VITALS — BP 120/70 | HR 69 | Ht 66.0 in | Wt 178.0 lb

## 2017-07-06 DIAGNOSIS — I480 Paroxysmal atrial fibrillation: Secondary | ICD-10-CM | POA: Diagnosis not present

## 2017-07-06 DIAGNOSIS — I10 Essential (primary) hypertension: Secondary | ICD-10-CM | POA: Diagnosis not present

## 2017-07-06 DIAGNOSIS — E876 Hypokalemia: Secondary | ICD-10-CM | POA: Diagnosis not present

## 2017-07-06 NOTE — Progress Notes (Signed)
Cardiology Office Note   Date:  07/06/2017   ID:  [name], DOB 1931/03/23, MRN [id number]  PCP:  Lavone Orn, MD  Cardiologist:  [name]     No chief complaint on file.     History of Present Illness: [name] is a 83 y.o. female who presents for HTN recheck and decrease amiodarone.  Pt with hx PAF maintaining SR on amiodarone.  She's had multiple cardioversions and has been treated with amiodarone and cardizem, anticoagulated with apixaban.  Also with htn, hld and DM-2.    Per Dr Antionette Char note "200 mg daily of amiodarone until she gets through surgery, then we can slowly reduce her dose to 100 mg daily once she recovers from surgery. We gave her specific instructions to slowly reduce amiodarone over a few months. She will continue on Eliquis for anticoagulation"   She is now recovering from Hiatal hernia s/p robotic repair &fundoplication 3/47/4259.  She had a lot of pain post procedure and was taking tylenol 1350 mg every 8 hours.  She was told she could not get refill of pain meds.   She has since decreased tylenol to arthritis strength every 8 hours.   Her BP has been elevated with this recovery was also elevated in hospital.  She had taken some of her old losartan in few days prior to last visit.  That was hyzaar 50/12.5. She was taken a half a tablet to one tablet.   She complained of leg swelling as well. She believes this has improved with taking the BP meds.  She continues on amiodarone and dilt.  Also on eliquis.    On last visit she had some SOB when talking on the phone.  No chest pain.  Her diet was cream of wheat and jello.  She has now advanced her diet and feels much better.  No palpations no rapid HR.   Last visit BP elevated and meds adjusted may begin decreasing amiodarone today. We did not decrease before now due to her not having recovered completely from surgery.      Today she feels well, much better.  Has energy and her BP is excellent.   She had resolution of lower ext edema. no chest pain and no SOB.     Past Medical History:  Diagnosis Date  . ADENOCARCINOMA, BREAST 10/13/2010   Qualifier: Diagnosis of  By: Nils Pyle CMA (AAMA), Mearl Latin    . Allergy    SEASONAL  . Anemia    years ago  . Arthritis    "all my joints" (03/31/2014)  . Atrial fibrillation ([place])   . Atrial fibrillation with RVR ([place]) 03/31/2015   DLCO dropped from 100% in 2015-50% in 01/2017  . Barrett's esophagus   . Breast cancer [facility])    s/p right breast lumpectomy  . Cervical cancer ([place])   . DJD (degenerative joint disease) of knee   . Dysrhythmia    Afib  . GERD (gastroesophageal reflux disease)   . Glaucoma   . H/O hiatal hernia   . Heart murmur   . Hx of transesophageal echocardiography (TEE) for monitoring    TEE (03/2014): No LAA clot, moderate LAE, core triatriatum type structure in RA with no stenosis, normal EF 55-60%, mild MR  . Hypertension   . Hypothyroidism, postsurgical   . Migraine    "last one was years ago" (03/31/2014)  . OSA on CPAP    settings at 10-11   . RECTAL FISSURE  10/13/2010   Qualifier: Diagnosis of  By: Nils Pyle CMA ([place]), Mearl Latin    . Sleep apnea   . Thyroid cancer [facility])    s/p thyroidectomy  . Type II diabetes mellitus ([place])    type II     Past Surgical History:  Procedure Laterality Date  . ABDOMINAL HYSTERECTOMY     "partial"  . BREAST BIOPSY Right   . BREAST LUMPECTOMY Right   . CARDIOVERSION N/A 03/30/2014   Procedure: CARDIOVERSION - BEDSIDE;  Surgeon: [name], MD;  Location: [place];  Service: Cardiovascular;  Laterality: N/A;  . CARDIOVERSION N/A 03/31/2014   Procedure: CARDIOVERSION  (BEDSIDE) ;  Surgeon: Lelon Perla, MD;  Location: [place];  Service: Cardiovascular;  Laterality: N/A;  . CARDIOVERSION N/A 04/27/2014   Procedure: CARDIOVERSION;  Surgeon: Darlin Coco, MD;  Location: [facility] ENDOSCOPY;  Service: Cardiovascular;  Laterality: N/A;  . CARDIOVERSION N/A 04/13/2015   Procedure:  CARDIOVERSION;  Surgeon: Thayer Headings, MD;  Location: [place];  Service: Cardiovascular;  Laterality: N/A;  . CARDIOVERSION N/A 01/08/2017   Procedure: CARDIOVERSION;  Surgeon: Sanda Klein, MD;  Location: [facility] ENDOSCOPY;  Service: Cardiovascular;  Laterality: N/A;  . CARPAL TUNNEL RELEASE Right   . CATARACT EXTRACTION W/ INTRAOCULAR LENS  IMPLANT, BILATERAL Bilateral   . COLONOSCOPY    . EXCISIONAL HEMORRHOIDECTOMY    . INSERTION OF MESH N/A 05/25/2017   Procedure: INSERTION OF MESH;  Surgeon: Ralene Ok, MD;  Location: [facility];  Service: General;  Laterality: N/A;  . TEE WITHOUT CARDIOVERSION N/A 03/30/2014   Procedure: TRANSESOPHAGEAL ECHOCARDIOGRAM (TEE);  Surgeon: [name], MD;  Location: [facility] ENDOSCOPY;  Service: Cardiovascular;  Laterality: N/A;  . THYROIDECTOMY    . TONSILLECTOMY       Current Outpatient Prescriptions  Medication Sig Dispense Refill  . acetaminophen (TYLENOL) 650 MG CR tablet Take 1,300 mg by mouth every 8 (eight) hours.    Marland Kitchen amiodarone (PACERONE) 200 MG tablet TAKE 1 TABLET BY MOUTH  DAILY 90 tablet 2  . calcium citrate-vitamin D (CITRACAL+D) 315-200 MG-UNIT tablet Take 1 tablet by mouth 2 (two) times daily.     . Cyanocobalamin (B-12 PO) Take 1 tablet by mouth daily as needed (for energy).     Marland Kitchen diltiazem (CARDIZEM CD) 360 MG 24 hr capsule TAKE 1 CAPSULE BY MOUTH  DAILY 90 capsule 3  . ELIQUIS 5 MG TABS tablet TAKE 1 TABLET BY MOUTH TWO  TIMES DAILY 180 tablet 3  . ferrous sulfate 325 (65 FE) MG tablet Take 325 mg by mouth daily with breakfast.    . fluticasone (FLONASE) 50 MCG/ACT nasal spray Place 1 spray into both nostrils daily as needed (for allergies.). Reported on 02/25/2016    . hydrochlorothiazide (MICROZIDE) 12.5 MG capsule Take 1 capsule (12.5 mg total) by mouth daily. 90 capsule 3  . lansoprazole (PREVACID) 30 MG capsule Take 1 capsule (30 mg total) by mouth 2 (two) times daily before a meal. 180 capsule 2  . levothyroxine (SYNTHROID,  LEVOTHROID) 175 MCG tablet Take 175 mcg by mouth daily before breakfast.    . LORazepam (ATIVAN) 1 MG tablet Take 1.5 mg by mouth at bedtime.    Marland Kitchen losartan (COZAAR) 25 MG tablet Take 1 tablet (25 mg total) by mouth daily. 90 tablet 3  . Lutein 6 MG CAPS Take 6 mg by mouth 2 (two) times daily as needed (for vision support).     . metFORMIN (GLUMETZA) 500 MG (MOD) 24 hr tablet Take  500 mg by mouth 2 (two) times daily with a meal.     . Multiple Vitamins-Minerals (OCUVITE PRESERVISION PO) Take 1 tablet by mouth daily.    . ONE TOUCH ULTRA TEST test strip 1 each by Other route 3 (three) times daily. Use as directed    . ONETOUCH DELICA LANCETS 87G MISC 1 each by Other route 3 (three) times daily. Use as directed    . Polyvinyl Alcohol-Povidone PF (REFRESH) 1.4-0.6 % SOLN Place 1 drop into both eyes at bedtime.    . potassium chloride (KLOR-CON) 20 MEQ packet Take by mouth daily.    . rosuvastatin (CRESTOR) 20 MG tablet Take 10 mg by mouth at bedtime.    . sertraline (ZOLOFT) 50 MG tablet Take 50 mg by mouth daily.     No current facility-administered medications for this visit.     Allergies:   Penicillins    Social History:  The patient  reports that she has never smoked. She has never used smokeless tobacco. She reports that she does not drink alcohol or use drugs.   Family History:  The patient's family history includes Diabetes in her brother, brother, brother, brother, brother, brother, brother, sister, sister, and sister; Heart attack in her brother; Heart disease in her brother, brother, brother, brother, brother, brother, and sister; Pancreatic cancer in her sister; Prostate cancer in her father.    ROS:  General:no colds or fevers, with surgery she has had weight loss, hopes not to re-gain Skin:no rashes or ulcers HEENT:no blurred vision, no congestion CV:see HPI PUL:see HPI GI:no diarrhea constipation or melena, no indigestion GU:no hematuria, no dysuria MS:no joint pain, no  claudication Neuro:no syncope, no lightheadedness Endo:no diabetes, no thyroid disease  Wt Readings from Last 3 Encounters:  07/06/17 178 lb (80.7 kg)  06/07/17 175 lb 6.4 oz (79.6 kg)  05/25/17 188 lb (85.3 kg)     PHYSICAL EXAM: VS:  BP 120/70   Pulse 69   Ht 5\' 6"  (1.676 m)   Wt 178 lb (80.7 kg)   LMP  (LMP Unknown)   SpO2 95%   BMI 28.73 kg/m  , BMI Body mass index is 28.73 kg/m. General:Pleasant affect, NAD Skin:Warm and dry, brisk capillary refill HEENT:normocephalic, sclera clear, mucus membranes moist Neck:supple, no JVD, no bruits  Heart:S1S2 RRR without murmur, gallup, rub or click Lungs:clear without rales, rhonchi, or wheezes OTL:XBWI, non tender, + BS, do not palpate liver spleen or masses Ext:no lower ext edema, 2+ pedal pulses, 2+ radial pulses Neuro:alert and oriented X 3, MAE, follows commands, + facial symmetry    EKG:  EKG is NOT ordered today.  Recent Labs: 12/27/2016: TSH 4.710 05/22/2017: Hemoglobin 12.2; Platelets 152 06/07/2017: ALT 20; Magnesium 2.2 06/14/2017: BUN 18; Creatinine, Ser 0.86; Potassium 3.4; Sodium 141    Lipid Panel No results found for: CHOL, TRIG, HDL, CHOLHDL, VLDL, LDLCALC, LDLDIRECT     Other studies Reviewed: Additional studies/ records that were reviewed today include: . See previous note   ASSESSMENT AND PLAN:  1.  HTN  Now on losartan 25 daily and HCTZ 12.5 daily with resolved lower ext edema and control of BP.  Will check labs today.  folllow up with [name] in Jan.   2.  PAF maintaining SR will now slowly decrease amiodarone to 100 mg daily as per Dr. Antionette Char plan she will begin 07/14/17 see below  3. Hypokalemia resolved      Current medicines are reviewed with the patient today.  The  patient Has no concerns regarding medicines.  The following changes have been made:  See above Labs/ tests ordered today include:see above  Disposition:   FU:  see above  Signed, Cecilie Kicks, NP  07/06/2017 3:37 PM      [facility], [place], [place], [place] Phone: [phone]; Fax: [phone]

## 2017-07-06 NOTE — Patient Instructions (Addendum)
Your physician has recommended you make the following change in your medication: AMIODARONE SEPT 1 TAKE 200 MG  5 DAYS A WEEK AND 100 MG THE OTHER  2 DAYS  OCT 1 TAKE 200 MG 3 DAYS A WEEK AND 100 MG OTHER 4 DAYS NOV 1 TAKE 100 MG EVERY DAY  Your physician recommends that you return for lab work in:  [place] physician recommends that you schedule a follow-up appointment in: January [place]

## 2017-07-07 LAB — BASIC METABOLIC PANEL
BUN/Creatinine Ratio: 17 (ref 12–28)
BUN: 19 mg/dL (ref 8–27)
CALCIUM: 9.3 mg/dL (ref 8.7–10.3)
CO2: 24 mmol/L (ref 20–29)
CREATININE: 1.1 mg/dL — AB (ref 0.57–1.00)
Chloride: 103 mmol/L (ref 96–106)
GFR calc Af Amer: 53 mL/min/{1.73_m2} — ABNORMAL LOW (ref >59)
GFR, EST NON AFRICAN AMERICAN: 46 mL/min/{1.73_m2} — AB (ref >59)
Glucose: 137 mg/dL — ABNORMAL HIGH (ref 65–99)
Potassium: 3.6 mmol/L (ref 3.5–5.2)
Sodium: 143 mmol/L (ref 134–144)

## 2017-07-09 ENCOUNTER — Telehealth: Payer: Self-pay | Admitting: Cardiology

## 2017-07-09 MED ORDER — HYDROCHLOROTHIAZIDE 12.5 MG PO CAPS: ORAL_CAPSULE | ORAL | 3 refills | Status: DC

## 2017-07-09 NOTE — Telephone Encounter (Signed)
New message ° ° ° ° ° °Returning a call to get lab results °

## 2017-07-09 NOTE — Telephone Encounter (Signed)
-----   Message from Isaiah Serge, NP sent at 07/08/2017  1:43 PM EDT ----- K+ is good, continue K dur once a day- that is what she said she took.  Change HCTZ to monday through Friday only none on the weekend due to slight change in kidney function and improved edema and BP

## 2017-07-13 ENCOUNTER — Telehealth: Payer: Self-pay | Admitting: Cardiology

## 2017-07-13 NOTE — Telephone Encounter (Signed)
Called patient back. Informed patient to call her pharmacy to check with them, to see if her HCTZ was part of the recall. Patient verbalized understanding and will call her pharmacy.

## 2017-07-13 NOTE — Telephone Encounter (Signed)
°  New Prob  States a prescription was written for her for Hydrochlorothiazide. However, pt states she was told this medication has been recalled. Please call.

## 2017-09-03 ENCOUNTER — Telehealth: Payer: Self-pay | Admitting: Cardiology

## 2017-09-03 NOTE — Telephone Encounter (Signed)
New message     Pt states she seen you in august and she is back in AFIB again for the 5th time   STAT if HR is under 50 or over 120 (normal HR is 60-100 beats per minute)  1) What is your heart rate? 130   2) Do you have a log of your heart rate readings (document readings)?  116 135   3) Do you have any other symptoms? no

## 2017-09-03 NOTE — Progress Notes (Deleted)
Cardiology Office Note    Date:  09/03/2017   ID:  [name], DOB 1931-02-10, MRN [id number]  PCP:  Lavone Orn, MD  Cardiologist:  [name]  Chief Complaint: AFib  History of Present Illness:   [name] is a 83 y.o. female with hx of PAF, DM, HTN, OSA on CPAP presents for afib.   Patient has a history of cardioversion and now requires amiodarone for rhythm control. She's been anticoagulated with Eliquis. CHADS-Vasc score is 37 (female gender, age 51-85, hypertension, diabetes).  Her amiodarone is taper off during her robotic repair &fundoplication 2/75/1700 for hiatal hernia. Patient requested taper to minimize side effects. Last seen by Cecilie Kicks, NP 07/06/17. Tapered amiodarone as per Dr. Antionette Char recommendations.  Recently noted palpitation when weaning off amiodarone. Here today for further discussion.   Past Medical History:  Diagnosis Date  . ADENOCARCINOMA, BREAST 10/13/2010   Qualifier: Diagnosis of  By: Nils Pyle CMA (AAMA), Mearl Latin    . Allergy    SEASONAL  . Anemia    years ago  . Arthritis    "all my joints" (03/31/2014)  . Atrial fibrillation ([place])   . Atrial fibrillation with RVR ([place]) 03/31/2015   DLCO dropped from 100% in 2015-50% in 01/2017  . Barrett's esophagus   . Breast cancer [facility])    s/p right breast lumpectomy  . Cervical cancer ([place])   . DJD (degenerative joint disease) of knee   . Dysrhythmia    Afib  . GERD (gastroesophageal reflux disease)   . Glaucoma   . H/O hiatal hernia   . Heart murmur   . Hx of transesophageal echocardiography (TEE) for monitoring    TEE (03/2014): No LAA clot, moderate LAE, core triatriatum type structure in RA with no stenosis, normal EF 55-60%, mild MR  . Hypertension   . Hypothyroidism, postsurgical   . Migraine    "last one was years ago" (03/31/2014)  . OSA on CPAP    settings at 10-11   . RECTAL FISSURE 10/13/2010   Qualifier: Diagnosis of  By: Nils Pyle CMA ([place]), Mearl Latin    . Sleep apnea   .  Thyroid cancer [facility])    s/p thyroidectomy  . Type II diabetes mellitus ([place])    type II     Past Surgical History:  Procedure Laterality Date  . ABDOMINAL HYSTERECTOMY     "partial"  . BREAST BIOPSY Right   . BREAST LUMPECTOMY Right   . CARDIOVERSION N/A 03/30/2014   Procedure: CARDIOVERSION - BEDSIDE;  Surgeon: [name], MD;  Location: [place];  Service: Cardiovascular;  Laterality: N/A;  . CARDIOVERSION N/A 03/31/2014   Procedure: CARDIOVERSION  (BEDSIDE) ;  Surgeon: Lelon Perla, MD;  Location: [place];  Service: Cardiovascular;  Laterality: N/A;  . CARDIOVERSION N/A 04/27/2014   Procedure: CARDIOVERSION;  Surgeon: Darlin Coco, MD;  Location: [facility] ENDOSCOPY;  Service: Cardiovascular;  Laterality: N/A;  . CARDIOVERSION N/A 04/13/2015   Procedure: CARDIOVERSION;  Surgeon: Thayer Headings, MD;  Location: [place];  Service: Cardiovascular;  Laterality: N/A;  . CARDIOVERSION N/A 01/08/2017   Procedure: CARDIOVERSION;  Surgeon: Sanda Klein, MD;  Location: [facility] ENDOSCOPY;  Service: Cardiovascular;  Laterality: N/A;  . CARPAL TUNNEL RELEASE Right   . CATARACT EXTRACTION W/ INTRAOCULAR LENS  IMPLANT, BILATERAL Bilateral   . COLONOSCOPY    . EXCISIONAL HEMORRHOIDECTOMY    . INSERTION OF MESH N/A 05/25/2017   Procedure: INSERTION OF MESH;  Surgeon: Ralene Ok, MD;  Location:  [facility];  Service: General;  Laterality: N/A;  . TEE WITHOUT CARDIOVERSION N/A 03/30/2014   Procedure: TRANSESOPHAGEAL ECHOCARDIOGRAM (TEE);  Surgeon: [name], MD;  Location: [facility] ENDOSCOPY;  Service: Cardiovascular;  Laterality: N/A;  . THYROIDECTOMY    . TONSILLECTOMY      Current Medications: Prior to Admission medications   Medication Sig Start Date End Date Taking? Authorizing Provider  acetaminophen (TYLENOL) 650 MG CR tablet Take 1,300 mg by mouth every 8 (eight) hours.    [provider]  amiodarone (PACERONE) 200 MG tablet TAKE 1 TABLET BY MOUTH  DAILY 02/13/17   Sherren Mocha, MD   calcium citrate-vitamin D (CITRACAL+D) 315-200 MG-UNIT tablet Take 1 tablet by mouth 2 (two) times daily.     [provider]  Cyanocobalamin (B-12 PO) Take 1 tablet by mouth daily as needed (for energy).     [provider]  diltiazem (CARDIZEM CD) 360 MG 24 hr capsule TAKE 1 CAPSULE BY MOUTH  DAILY 05/14/17   Sherren Mocha, MD  ELIQUIS 5 MG TABS tablet TAKE 1 TABLET BY MOUTH TWO  TIMES DAILY 08/08/16   Sherren Mocha, MD  ferrous sulfate 325 (65 FE) MG tablet Take 325 mg by mouth daily with breakfast.    [provider]  fluticasone (FLONASE) 50 MCG/ACT nasal spray Place 1 spray into both nostrils daily as needed (for allergies.). Reported on 02/25/2016    [provider]  hydrochlorothiazide (MICROZIDE) 12.5 MG capsule Take 1 tablet by mouth Monday-Friday only 07/09/17   Isaiah Serge, NP  lansoprazole (PREVACID) 30 MG capsule Take 1 capsule (30 mg total) by mouth 2 (two) times daily before a meal. 01/02/17   Rigoberto Noel, MD  levothyroxine (SYNTHROID, LEVOTHROID) 175 MCG tablet Take 175 mcg by mouth daily before breakfast.    [provider]  LORazepam (ATIVAN) 1 MG tablet Take 1.5 mg by mouth at bedtime.    [provider]  losartan (COZAAR) 25 MG tablet Take 1 tablet (25 mg total) by mouth daily. 06/07/17 09/05/17  Isaiah Serge, NP  Lutein 6 MG CAPS Take 6 mg by mouth 2 (two) times daily as needed (for vision support).     [provider]  metFORMIN (GLUMETZA) 500 MG (MOD) 24 hr tablet Take 500 mg by mouth 2 (two) times daily with a meal.     [provider]  Multiple Vitamins-Minerals (OCUVITE PRESERVISION PO) Take 1 tablet by mouth daily.    [provider]  ONE TOUCH ULTRA TEST test strip 1 each by Other route 3 (three) times daily. Use as directed 02/16/15   [provider]  Jonetta Speak LANCETS 32I MISC 1 each by Other route 3 (three) times daily. Use as directed 02/16/15   [provider]   Polyvinyl Alcohol-Povidone PF (REFRESH) 1.4-0.6 % SOLN Place 1 drop into both eyes at bedtime.    [provider]  potassium chloride (KLOR-CON) 20 MEQ packet Take by mouth daily.    [provider]  rosuvastatin (CRESTOR) 20 MG tablet Take 10 mg by mouth at bedtime.    [provider]  sertraline (ZOLOFT) 50 MG tablet Take 50 mg by mouth daily.    [provider]    Allergies:   Penicillins   Social History   Social History  . Marital status: Divorced    Spouse name: N/A  . Number of children: 2  . Years of education: N/A   Occupational History  . Retired  Social History Main Topics  . Smoking status: Never Smoker  . Smokeless tobacco: Never Used  . Alcohol use No  . Drug use: No  . Sexual activity: No   Other Topics Concern  . Not on file   Social History Narrative  . No narrative on file     Family History:  The patient's family history includes Diabetes in her brother, brother, brother, brother, brother, brother, brother, sister, sister, and sister; Heart attack in her brother; Heart disease in her brother, brother, brother, brother, brother, brother, and sister; Pancreatic cancer in her sister; Prostate cancer in her father. ***  ROS:   Please see the history of present illness.    ROS All other systems reviewed and are negative.   PHYSICAL EXAM:   VS:  LMP  (LMP Unknown)    GEN: Well nourished, well developed, in no acute distress  HEENT: normal  Neck: no JVD, carotid bruits, or masses Cardiac: ***RRR; no murmurs, rubs, or gallops,no edema  Respiratory:  clear to auscultation bilaterally, normal work of breathing GI: soft, nontender, nondistended, + BS MS: no deformity or atrophy  Skin: warm and dry, no rash Neuro:  Alert and Oriented x 3, Strength and sensation are intact Psych: euthymic mood, full affect  Wt Readings from Last 3 Encounters:  07/06/17 178 lb (80.7 kg)  06/07/17 175 lb 6.4 oz (79.6 kg)  05/25/17 188  lb (85.3 kg)      Studies/Labs Reviewed:   EKG:  EKG is ordered today.  The ekg ordered today demonstrates ***  Recent Labs: 12/27/2016: TSH 4.710 05/22/2017: Hemoglobin 12.2; Platelets 152 06/07/2017: ALT 20; Magnesium 2.2 07/06/2017: BUN 19; Creatinine, Ser 1.10; Potassium 3.6; Sodium 143   Lipid Panel No results found for: CHOL, TRIG, HDL, CHOLHDL, VLDL, LDLCALC, LDLDIRECT  Additional studies/ records that were reviewed today include:   Echocardiogram: 04/2015 Study Conclusions  - Left ventricle: The cavity size was normal. There was severe   focal basal and moderate concentric hypertrophy of the left   ventricle. Systolic function was vigorous. The estimated ejection   fraction was in the range of 65% to 70%. Wall motion was normal;   there were no regional wall motion abnormalities. Features are   consistent with a pseudonormal left ventricular filling pattern,   with concomitant abnormal relaxation and increased filling   pressure (grade 2 diastolic dysfunction). Doppler parameters are   consistent with elevated ventricular end-diastolic filling   pressure. - Aortic valve: There was mild regurgitation. - Aortic root: The aortic root was normal in size. - Left atrium: The atrium was moderately dilated. - Right ventricle: Systolic function was normal. - Right atrium: The atrium was normal in size. - Tricuspid valve: There was mild regurgitation. - Pulmonic valve: There was no regurgitation. - Pulmonary arteries: Systolic pressure was within the normal   range. PA peak pressure: 31 mm Hg (S). - Inferior vena cava: The vessel was normal in size. - Pericardium, extracardiac: There was no pericardial effusion.     ASSESSMENT & PLAN:    1. PAF  2. HTN  3. HLD    Medication Adjustments/Labs and Tests Ordered: Current medicines are reviewed at length with the patient today.  Concerns regarding medicines are outlined above.  Medication changes, Labs and Tests  ordered today are listed in the Patient Instructions below. There are no Patient Instructions on file for this visit.   Jarrett Soho, PA  09/03/2017 3:19 PM    [facility]  [address] [place], [place]  [address] Phone: [phone]; Fax: [phone]

## 2017-09-03 NOTE — Telephone Encounter (Signed)
Patient states she has been in atrial fibrillation intermittently for several days.  She has been trying to wean her amiodarone because it is affecting her lungs and eye sight.  Per Karilyn Cota 8/24 OV note, patient was instructed to: "Your physician has recommended you make the following change in your medication: AMIODARONE SEPT 1 TAKE 200 MG  5 DAYS A WEEK AND 100 MG THE OTHER  2 DAYS  OCT 1 TAKE 200 MG 3 DAYS A WEEK AND 100 MG OTHER 4 DAYS NOV 1 TAKE 100 MG EVERY DAY" She was "doing fine" until she decreased to 200 mg 3 times a week and 100 mg 4 times weekly.  She states she is "real shaky and weak." She denies CP and SOB. Currently, her HR is 105 and BP 122/95. She took Amio 100 mg this AM at 0100, another 100 mg at 0600, and another 100 mg at 1230 trying to convert back to NSR. Confirmed with patient she is taking all other medications as directed. She is not in acute distress, but she is very nervous.  Scheduled patient tomorrow with Jonelle Sidle, PA for evaluation and medication management.  She was grateful for call and agrees with treatment plan.

## 2017-09-04 ENCOUNTER — Ambulatory Visit: Payer: Medicare Other | Admitting: Pulmonary Disease

## 2017-09-04 ENCOUNTER — Ambulatory Visit: Payer: Medicare Other | Admitting: Physician Assistant

## 2017-09-05 ENCOUNTER — Telehealth: Payer: Self-pay | Admitting: Cardiovascular Disease

## 2017-09-05 ENCOUNTER — Ambulatory Visit (INDEPENDENT_AMBULATORY_CARE_PROVIDER_SITE_OTHER): Payer: Medicare Other | Admitting: Nurse Practitioner

## 2017-09-05 ENCOUNTER — Encounter: Payer: Self-pay | Admitting: Nurse Practitioner

## 2017-09-05 VITALS — BP 126/70 | HR 116 | Ht 66.0 in | Wt 178.0 lb

## 2017-09-05 DIAGNOSIS — I10 Essential (primary) hypertension: Secondary | ICD-10-CM | POA: Diagnosis not present

## 2017-09-05 DIAGNOSIS — Z7901 Long term (current) use of anticoagulants: Secondary | ICD-10-CM | POA: Diagnosis not present

## 2017-09-05 DIAGNOSIS — Z79899 Other long term (current) drug therapy: Secondary | ICD-10-CM | POA: Diagnosis not present

## 2017-09-05 DIAGNOSIS — I480 Paroxysmal atrial fibrillation: Secondary | ICD-10-CM | POA: Diagnosis not present

## 2017-09-05 NOTE — Progress Notes (Signed)
CARDIOLOGY OFFICE NOTE  Date:  09/05/2017    [name] Date of Birth: Nov 26, 1930 Medical Record #[id number]  PCP:  Lavone Orn, MD  Cardiologist:  Burt Knack   Chief Complaint  [name] presents with  . Atrial Fibrillation    Work in visit - seen for [name]    History of Present Illness: [name] is a 83 y.o. female who presents today for a work in visit. Seen for [name].   She has a history of PAF - on amiodarone, has had multiple cardioversions. She is on Eliquis for her anticoagulation. Other issues include HTN, HLD and DM.   Seen here by Cecilie Kicks, NP back in August. Her dose of amiodarone was cut back once she got thru her hiatal hernia surgery from July. [name] wanted to try and prevent long term side effects from amiodarone. She was to continue with her anticoagulation. She was doing well at that visit.   Phone call today - "Spoke with [name] and she stated she felt like she was back in Afib with heart rate of 130. [name], Eliquis, [name]. She has been decreasing her Amiodarone as directed and on 08/31/17 thinks she went back in Afib but had converted. She has been taking Amiodarone 200 mg daily since. Today she is feeling a little shortness of breath, feels like she is back in Afib, and heart rate 130. [name]"  Thus added to my schedule for today.   Comes in today. Here alone. She says this is "all her fault" - she tried to exercise and thinks this caused Natasha recurrence. She had been alternating her dose of amiodarone 200 mg with 100 mg. Felt like she has been going "in and out" since last Friday but sounds like her HR has consistently been elevated. She is back on 200 mg dose - she has put herself back on this.  She says she would have called Natasha AF clinic - but did not have Natasha number. She is worried about her lungs and her vision - apparently already has had some  abnormality and she does not wish to "do any more damage". CT scan from March showed no classic findings of toxicity. Moderate diffusion defect noted on PFTs from March. She does feel more fatigued, more short of breath with her AF. She held her Losartan and HCTZ today due to lower BP. She remains on her Eliquis - no missed doses.   Past Medical History:  Diagnosis Date  . ADENOCARCINOMA, BREAST 10/13/2010   Qualifier: Diagnosis of  By: Nils Pyle CMA (AAMA), Mearl Latin    . Allergy    SEASONAL  . Anemia    years ago  . Arthritis    "all my joints" (03/31/2014)  . Atrial fibrillation ([place])   . Atrial fibrillation with RVR ([place]) 03/31/2015   DLCO dropped from 100% in 2015-50% in 01/2017  . Barrett's esophagus   . Breast cancer [facility])    s/p right breast lumpectomy  . Cervical cancer ([place])   . DJD (degenerative joint disease) of knee   . Dysrhythmia    Afib  . GERD (gastroesophageal reflux disease)   . Glaucoma   . H/O hiatal hernia   . Heart murmur   . Hx of transesophageal echocardiography (TEE) for monitoring    TEE (03/2014): No LAA clot, moderate LAE, core triatriatum type structure in RA with no stenosis, normal EF 55-60%,  mild MR  . Hypertension   . Hypothyroidism, postsurgical   . Migraine    "last one was years ago" (03/31/2014)  . OSA on CPAP    settings at 10-11   . RECTAL FISSURE 10/13/2010   Qualifier: Diagnosis of  By: Nils Pyle CMA ([place]), Mearl Latin    . Sleep apnea   . Thyroid cancer [facility])    s/p thyroidectomy  . Type II diabetes mellitus ([place])    type II     Past Surgical History:  Procedure Laterality Date  . ABDOMINAL HYSTERECTOMY     "partial"  . BREAST BIOPSY Right   . BREAST LUMPECTOMY Right   . CARDIOVERSION N/A 03/30/2014   Procedure: CARDIOVERSION - BEDSIDE;  Surgeon: [name], MD;  Location: [place];  Service: Cardiovascular;  Laterality: N/A;  . CARDIOVERSION N/A 03/31/2014   Procedure: CARDIOVERSION  (BEDSIDE) ;  Surgeon: Lelon Perla, MD;  Location: [place];  Service: Cardiovascular;  Laterality: N/A;  . CARDIOVERSION N/A 04/27/2014   Procedure: CARDIOVERSION;  Surgeon: Darlin Coco, MD;  Location: [facility] ENDOSCOPY;  Service: Cardiovascular;  Laterality: N/A;  . CARDIOVERSION N/A 04/13/2015   Procedure: CARDIOVERSION;  Surgeon: Thayer Headings, MD;  Location: [place];  Service: Cardiovascular;  Laterality: N/A;  . CARDIOVERSION N/A 01/08/2017   Procedure: CARDIOVERSION;  Surgeon: Sanda Klein, MD;  Location: [facility] ENDOSCOPY;  Service: Cardiovascular;  Laterality: N/A;  . CARPAL TUNNEL RELEASE Right   . CATARACT EXTRACTION W/ INTRAOCULAR LENS  IMPLANT, BILATERAL Bilateral   . COLONOSCOPY    . EXCISIONAL HEMORRHOIDECTOMY    . INSERTION OF MESH N/A 05/25/2017   Procedure: INSERTION OF MESH;  Surgeon: Ralene Ok, MD;  Location: [facility];  Service: General;  Laterality: N/A;  . TEE WITHOUT CARDIOVERSION N/A 03/30/2014   Procedure: TRANSESOPHAGEAL ECHOCARDIOGRAM (TEE);  Surgeon: [name], MD;  Location: [facility] ENDOSCOPY;  Service: Cardiovascular;  Laterality: N/A;  . THYROIDECTOMY    . TONSILLECTOMY       Medications: Current Meds  Medication Sig  . acetaminophen (TYLENOL) 650 MG CR tablet Take 1,300 mg by mouth every 8 (eight) hours.  Marland Kitchen amiodarone (PACERONE) 200 MG tablet TAKE 1 TABLET BY MOUTH  DAILY  . calcium citrate-vitamin D (CITRACAL+D) 315-200 MG-UNIT tablet Take 1 tablet by mouth 2 (two) times daily.   . Cyanocobalamin (B-12 PO) Take 1 tablet by mouth daily as needed (for energy).   Marland Kitchen diltiazem (CARDIZEM CD) 360 MG 24 hr capsule TAKE 1 CAPSULE BY MOUTH  DAILY  . ELIQUIS 5 MG TABS tablet TAKE 1 TABLET BY MOUTH TWO  TIMES DAILY  . ferrous sulfate 325 (65 FE) MG tablet Take 325 mg by mouth daily with breakfast.  . fluticasone (FLONASE) 50 MCG/ACT nasal spray Place 1 spray into both nostrils daily as needed (for allergies.). Reported on 02/25/2016  . hydrochlorothiazide (MICROZIDE) 12.5 MG capsule Take 1 tablet by mouth  Monday-Friday only  . lansoprazole (PREVACID) 30 MG capsule Take 1 capsule (30 mg total) by mouth 2 (two) times daily before a meal.  . levothyroxine (SYNTHROID, LEVOTHROID) 175 MCG tablet Take 175 mcg by mouth daily before breakfast.  . LORazepam (ATIVAN) 1 MG tablet Take 1.5 mg by mouth at bedtime.  Marland Kitchen losartan (COZAAR) 25 MG tablet Take 1 tablet (25 mg total) by mouth daily.  . Lutein 6 MG CAPS Take 6 mg by mouth 2 (two) times daily as needed (for vision support).   . metFORMIN (GLUMETZA) 500 MG (MOD) 24 hr tablet  Take 500 mg by mouth 2 (two) times daily with a meal.   . Multiple Vitamins-Minerals (OCUVITE PRESERVISION PO) Take 1 tablet by mouth daily.  . ONE TOUCH ULTRA TEST test strip 1 each by Other route 3 (three) times daily. Use as directed  . ONETOUCH DELICA LANCETS 25D MISC 1 each by Other route 3 (three) times daily. Use as directed  . Polyvinyl Alcohol-Povidone PF (REFRESH) 1.4-0.6 % SOLN Place 1 drop into both eyes at bedtime.  . potassium chloride (KLOR-CON) 20 MEQ packet Take by mouth daily.  . rosuvastatin (CRESTOR) 20 MG tablet Take 10 mg by mouth at bedtime.  . sertraline (ZOLOFT) 50 MG tablet Take 50 mg by mouth daily.     Allergies: Allergies  Allergen Reactions  . Penicillins Itching and Rash    Has [name] had a PCN reaction causing immediate rash, facial/tongue/throat swelling, SOB or lightheadedness with hypotension: Unknown, childhood reaction Has [name] had a PCN reaction causing severe rash involving mucus membranes or skin necrosis: No Has [name] had a PCN reaction that required hospitalization No Has [name] had a PCN reaction occurring within Natasha last 10 years: No If all of Natasha above answers are "NO", then may proceed with Cephalosporin use.     Social History: [name]  reports that she has never smoked. She has never used smokeless tobacco. She reports that she does not drink alcohol or use drugs.   Family History: [name]'s family history  includes Diabetes in her brother, brother, brother, brother, brother, brother, brother, sister, sister, and sister; Heart attack in her brother; Heart disease in her brother, brother, brother, brother, brother, brother, and sister; Pancreatic cancer in her sister; Prostate cancer in her father.   Review of Systems: Please see Natasha history of present illness.   Otherwise, Natasha review of systems is positive for none.   All other systems are reviewed and negative.   Physical Exam: VS:  BP 126/70   Pulse (!) 116   Ht 5\' 6"  (1.676 m)   Wt 178 lb (80.7 kg)   LMP  (LMP Unknown)   BMI 28.73 kg/m  .  BMI Body mass index is 28.73 kg/m.  Wt Readings from Last 3 Encounters:  09/05/17 178 lb (80.7 kg)  07/06/17 178 lb (80.7 kg)  06/07/17 175 lb 6.4 oz (79.6 kg)    General: Pleasant. Elderly female. Quite talkative. Alert and in no acute distress.   HEENT: Normal.  Neck: Supple, no JVD, carotid bruits, or masses noted.  Cardiac: Irregular irregular rhythm. Rate is a little elevated. No murmurs, rubs, or gallops. No edema.  Respiratory:  Lungs are fairly clear to auscultation bilaterally with normal work of breathing.  GI: Soft and nontender.  MS: No deformity or atrophy. Gait and ROM intact.  Skin: Warm and dry. Color is normal.  Neuro:  Strength and sensation are intact and no gross focal deficits noted.  Psych: Alert, appropriate and with normal affect.   LABORATORY DATA:  EKG:  EKG is not ordered today. This demonstrates AF with rate of 116.  Lab Results  Component Value Date   WBC 4.3 05/22/2017   HGB 12.2 05/22/2017   HCT 36.7 05/22/2017   PLT 152 05/22/2017   GLUCOSE 137 (H) 07/06/2017   ALT 20 06/07/2017   AST 17 06/07/2017   NA 143 07/06/2017   K 3.6 07/06/2017   CL 103 07/06/2017   CREATININE 1.10 (H) 07/06/2017   BUN 19 07/06/2017   CO2 24 07/06/2017  TSH 4.710 (H) 12/27/2016   INR 1.12 04/20/2016   HGBA1C 6.7 (H) 05/22/2017     BNP (last 3 results) No results  for input(s): BNP in Natasha last 8760 hours.  ProBNP (last 3 results) No results for input(s): PROBNP in Natasha last 8760 hours.   Other Studies Reviewed Today:  Echo Study Conclusions 2016  - Left ventricle: Natasha cavity size was normal. There was severe   focal basal and moderate concentric hypertrophy of Natasha left   ventricle. Systolic function was vigorous. Natasha estimated ejection   fraction was in Natasha range of 65% to 70%. Wall motion was normal;   there were no regional wall motion abnormalities. Features are   consistent with a pseudonormal left ventricular filling pattern,   with concomitant abnormal relaxation and increased filling   pressure (grade 2 diastolic dysfunction). Doppler parameters are   consistent with elevated ventricular end-diastolic filling   pressure. - Aortic valve: There was mild regurgitation. - Aortic root: Natasha aortic root was normal in size. - Left atrium: Natasha atrium was moderately dilated. - Right ventricle: Systolic function was normal. - Right atrium: Natasha atrium was normal in size. - Tricuspid valve: There was mild regurgitation. - Pulmonic valve: There was no regurgitation. - Pulmonary arteries: Systolic pressure was within Natasha normal   range. PA peak pressure: 31 mm Hg (S). - Inferior vena cava: Natasha vessel was normal in size. - Pericardium, extracardiac: There was no pericardial effusion.   Assessment/Plan: 1. Persistent AF - was trying to wean down on amiodarone - now has breakthru - would increase back to 200 mg a day. She wants to discuss other possible drug options - she would like to see Butch Penny back in Natasha AF clinic for discussion. May need repeat cardioversion once we have her on Natasha higher dose of Amiodarone. Will arrange follow up there next week. Lab today.   2. HTN - Bp now lower - she does a good job with checking at home - will hold her ARB and diuretic if remains soft. I suspect her lower BP is due to Natasha AF.   3. Chronic anticoagulation  - no missed doses reported. May need repeat cardioversion.   4. High risk therapy  Current medicines are reviewed with [name] today.  [name] does not have concerns regarding medicines other than what has been noted above.  Natasha following changes have been made:  See above.  Labs/ tests ordered today include:    Orders Placed This Encounter  Procedures  . Basic metabolic panel  . CBC  . Hepatic function panel  . TSH  . EKG 12-Lead     Disposition:   FU with Roderic Palau, NP next week in Natasha AF clinic with repeat EKG.   [name] is agreeable to this plan and will call if any problems develop in Natasha interim.   SignedTruitt Merle, NP  09/05/2017 3:11 PM  [facility] [address] [place], [place]  [address] Phone: [phone] Fax: [phone]

## 2017-09-05 NOTE — Telephone Encounter (Signed)
Spoke with patient and she stated she felt like she was back in Afib with heart rate of 130. Patient is taking Amiodarone, Eliquis, [name]. She has been decreasing her Amiodarone as directed and on 08/31/17 thinks she went back in Afib but had converted. She has been taking Amiodarone 200 mg daily since. Today she is feeling a little shortness of breath, feels like she is back in Afib, and heart rate 130. Patient scheduled to see Tera Helper NP today

## 2017-09-05 NOTE — Patient Instructions (Addendum)
We will be checking the following labs today - BMET, CBC, HPF, TSH   Medication Instructions:    Continue with your current medicines.   Stay on the 200 mg of amiodarone for now.   Ok to hold your Losartan and HCTZ if BP is low.     Testing/Procedures To Be Arranged:  N/A  Follow-Up:   See Butch Penny in the AF clinic next week - discuss possible cardioversion or other drug options for you.     Other Special Instructions:   N/A    If you need a refill on your cardiac medications before your next appointment, please call your pharmacy.   Call the [place] office at [phone] if you have any questions, problems or concerns.

## 2017-09-05 NOTE — Telephone Encounter (Signed)
Patient c/o Palpitations:  High priority if patient c/o lightheadedness, shortness of breath, or chest pain  How long have you had palpitations/irregular HR/ Afib? Are you having the symptoms now? 3 days   yes Are you currently experiencing lightheadedness, SOB or CP?  Sob  1) Do you have a history of afib (atrial fibrillation) or irregular heart rhythm? yes  2) Have you checked your BP or HR? (document readings if available):  119  130  3) Are you experiencing any other symptoms? no

## 2017-09-06 LAB — BASIC METABOLIC PANEL
BUN/Creatinine Ratio: 22 (ref 12–28)
BUN: 18 mg/dL (ref 8–27)
CO2: 24 mmol/L (ref 20–29)
Calcium: 10 mg/dL (ref 8.7–10.3)
Chloride: 101 mmol/L (ref 96–106)
Creatinine, Ser: 0.81 mg/dL (ref 0.57–1.00)
GFR calc Af Amer: 77 mL/min/{1.73_m2} (ref >59)
GFR calc non Af Amer: 67 mL/min/{1.73_m2} (ref >59)
Glucose: 113 mg/dL — ABNORMAL HIGH (ref 65–99)
Potassium: 4.3 mmol/L (ref 3.5–5.2)
Sodium: 141 mmol/L (ref 134–144)

## 2017-09-06 LAB — CBC
Hematocrit: 41.1 % (ref 34.0–46.6)
Hemoglobin: 13.1 g/dL (ref 11.1–15.9)
MCH: 30.9 pg (ref 26.6–33.0)
MCHC: 31.9 g/dL (ref 31.5–35.7)
MCV: 97 fL (ref 79–97)
Platelets: 205 10*3/uL (ref 150–379)
RBC: 4.24 x10E6/uL (ref 3.77–5.28)
RDW: 13.9 % (ref 12.3–15.4)
WBC: 4.9 10*3/uL (ref 3.4–10.8)

## 2017-09-06 LAB — HEPATIC FUNCTION PANEL
ALT: 15 IU/L (ref 0–32)
AST: 13 IU/L (ref 0–40)
Albumin: 4.4 g/dL (ref 3.5–4.7)
Alkaline Phosphatase: 86 IU/L (ref 39–117)
Bilirubin Total: 0.3 mg/dL (ref 0.0–1.2)
Bilirubin, Direct: 0.09 mg/dL (ref 0.00–0.40)
Total Protein: 6.7 g/dL (ref 6.0–8.5)

## 2017-09-06 LAB — TSH: TSH: 2.63 u[IU]/mL (ref 0.450–4.500)

## 2017-09-11 ENCOUNTER — Encounter (HOSPITAL_COMMUNITY): Payer: Self-pay | Admitting: Nurse Practitioner

## 2017-09-11 ENCOUNTER — Ambulatory Visit (HOSPITAL_COMMUNITY)
Admission: RE | Admit: 2017-09-11 | Discharge: 2017-09-11 | Disposition: A | Discharge status: Home / Self Care | Payer: Medicare Other | Source: Ambulatory Visit | Attending: Nurse Practitioner | Admitting: Nurse Practitioner

## 2017-09-11 VITALS — BP 114/64 | HR 124 | Ht 66.0 in | Wt 179.4 lb

## 2017-09-11 DIAGNOSIS — Z85850 Personal history of malignant neoplasm of thyroid: Secondary | ICD-10-CM | POA: Insufficient documentation

## 2017-09-11 DIAGNOSIS — I4819 Other persistent atrial fibrillation: Secondary | ICD-10-CM

## 2017-09-11 DIAGNOSIS — Z7901 Long term (current) use of anticoagulants: Secondary | ICD-10-CM | POA: Diagnosis not present

## 2017-09-11 DIAGNOSIS — I10 Essential (primary) hypertension: Secondary | ICD-10-CM | POA: Diagnosis not present

## 2017-09-11 DIAGNOSIS — Z79899 Other long term (current) drug therapy: Secondary | ICD-10-CM | POA: Insufficient documentation

## 2017-09-11 DIAGNOSIS — Z880 Allergy status to penicillin: Secondary | ICD-10-CM | POA: Diagnosis not present

## 2017-09-11 DIAGNOSIS — Z7984 Long term (current) use of oral hypoglycemic drugs: Secondary | ICD-10-CM | POA: Insufficient documentation

## 2017-09-11 DIAGNOSIS — Z8541 Personal history of malignant neoplasm of cervix uteri: Secondary | ICD-10-CM | POA: Insufficient documentation

## 2017-09-11 DIAGNOSIS — R05 Cough: Secondary | ICD-10-CM | POA: Insufficient documentation

## 2017-09-11 DIAGNOSIS — K2270 Barrett's esophagus without dysplasia: Secondary | ICD-10-CM | POA: Insufficient documentation

## 2017-09-11 DIAGNOSIS — Z853 Personal history of malignant neoplasm of breast: Secondary | ICD-10-CM | POA: Diagnosis not present

## 2017-09-11 DIAGNOSIS — K219 Gastro-esophageal reflux disease without esophagitis: Secondary | ICD-10-CM | POA: Insufficient documentation

## 2017-09-11 DIAGNOSIS — I481 Persistent atrial fibrillation: Secondary | ICD-10-CM | POA: Insufficient documentation

## 2017-09-11 DIAGNOSIS — E039 Hypothyroidism, unspecified: Secondary | ICD-10-CM | POA: Insufficient documentation

## 2017-09-11 DIAGNOSIS — E119 Type 2 diabetes mellitus without complications: Secondary | ICD-10-CM | POA: Diagnosis not present

## 2017-09-11 DIAGNOSIS — H409 Unspecified glaucoma: Secondary | ICD-10-CM | POA: Diagnosis not present

## 2017-09-11 DIAGNOSIS — I4891 Unspecified atrial fibrillation: Secondary | ICD-10-CM | POA: Diagnosis present

## 2017-09-11 DIAGNOSIS — G4733 Obstructive sleep apnea (adult) (pediatric): Secondary | ICD-10-CM | POA: Diagnosis not present

## 2017-09-11 NOTE — Progress Notes (Signed)
Primary Care Physician: Lavone Orn, MD Cardiologist: [name] Referral provider: Truitt Merle, NP Pulmonologist: Dr. Elsworth Soho GI: Dr. Dolphus Jenny [name] is a 83 y.o. female with a h/o afib that has been on amiodarone since June of 2015. She had afib 12/2016, in the setting of persistent cough x 6-8 weeks  that had not responded to two rounds of prednisone and antibiotics. She had also been using cough syrup with decongestants. Her Cardizem was increased and  BB was added with good slowing of v rate and with slower heart rate, she does feel better but persists in afib. She is not really wanting a cardioversion. Her biggest concern was  cough. She did have a CXR and other than a large hiatal hernia, there were no other abnormalities noted. TSH had also been mildly elevated but PCP aware and was monitoring. .Continues on eliquis. She did have repair of Hiatal hernia 05/25/17.  She is in the  afib clinic for return to afib 7-10 days ago. She had been trying to wean off amiodarone with concerns re her lungs and her eye doctor could see evidence of amio use, but she states that her vision had not changed. CT chest in March did not show any evidence of lung toxicity for amiodarone. She is pending another eval with lung doctor 11/13.  She increased her amio back to 200 mg a day about 10 days ago.  We discussed options to amiodarone, with either Tikosyn or sotalol and would have to let amio wash out and may take 2-3 months before eligible for the other drugs. She does not think she can tolerate afib for that period of time. We also discussed possible av nodal ablation and PPM but she does not like this option as well. For now she is happy to use the amiodarone. No missed doses of eliquis.She is tolerating the afib   Today, she denies symptoms of chest pain, shortness of breath, orthopnea, PND, lower extremity edema, dizziness, presyncope, syncope, or neurologic sequela.Perisistent cough. The patient  is tolerating medications without difficulties and is otherwise without complaint today.   Past Medical History:  Diagnosis Date  . ADENOCARCINOMA, BREAST 10/13/2010   Qualifier: Diagnosis of  By: Nils Pyle CMA (AAMA), Mearl Latin    . Allergy    SEASONAL  . Anemia    years ago  . Arthritis    "all my joints" (03/31/2014)  . Atrial fibrillation ([place])   . Atrial fibrillation with RVR ([place]) 03/31/2015   DLCO dropped from 100% in 2015-50% in 01/2017  . Barrett's esophagus   . Breast cancer [facility])    s/p right breast lumpectomy  . Cervical cancer ([place])   . DJD (degenerative joint disease) of knee   . Dysrhythmia    Afib  . GERD (gastroesophageal reflux disease)   . Glaucoma   . H/O hiatal hernia   . Heart murmur   . Hx of transesophageal echocardiography (TEE) for monitoring    TEE (03/2014): No LAA clot, moderate LAE, core triatriatum type structure in RA with no stenosis, normal EF 55-60%, mild MR  . Hypertension   . Hypothyroidism, postsurgical   . Migraine    "last one was years ago" (03/31/2014)  . OSA on CPAP    settings at 10-11   . RECTAL FISSURE 10/13/2010   Qualifier: Diagnosis of  By: Nils Pyle CMA ([place]), Mearl Latin    . Sleep apnea   . Thyroid cancer [facility])    s/p thyroidectomy  . Type  II diabetes mellitus ([place])    type II    Past Surgical History:  Procedure Laterality Date  . ABDOMINAL HYSTERECTOMY     "partial"  . BREAST BIOPSY Right   . BREAST LUMPECTOMY Right   . CARDIOVERSION N/A 03/30/2014   Procedure: CARDIOVERSION - BEDSIDE;  Surgeon: [name], MD;  Location: [place];  Service: Cardiovascular;  Laterality: N/A;  . CARDIOVERSION N/A 03/31/2014   Procedure: CARDIOVERSION  (BEDSIDE) ;  Surgeon: Lelon Perla, MD;  Location: [place];  Service: Cardiovascular;  Laterality: N/A;  . CARDIOVERSION N/A 04/27/2014   Procedure: CARDIOVERSION;  Surgeon: Darlin Coco, MD;  Location: [facility] ENDOSCOPY;  Service: Cardiovascular;  Laterality: N/A;  . CARDIOVERSION N/A 04/13/2015    Procedure: CARDIOVERSION;  Surgeon: Thayer Headings, MD;  Location: [place];  Service: Cardiovascular;  Laterality: N/A;  . CARDIOVERSION N/A 01/08/2017   Procedure: CARDIOVERSION;  Surgeon: Sanda Klein, MD;  Location: [facility] ENDOSCOPY;  Service: Cardiovascular;  Laterality: N/A;  . CARPAL TUNNEL RELEASE Right   . CATARACT EXTRACTION W/ INTRAOCULAR LENS  IMPLANT, BILATERAL Bilateral   . COLONOSCOPY    . EXCISIONAL HEMORRHOIDECTOMY    . INSERTION OF MESH N/A 05/25/2017   Procedure: INSERTION OF MESH;  Surgeon: Ralene Ok, MD;  [facility];  Service: General;  Laterality: N/A;  . TEE WITHOUT CARDIOVERSION N/A 03/30/2014   Procedure: TRANSESOPHAGEAL ECHOCARDIOGRAM (TEE);  Surgeon: [name], MD;  Location: [facility] ENDOSCOPY;  Service: Cardiovascular;  Laterality: N/A;  . THYROIDECTOMY    . TONSILLECTOMY      Current Outpatient Prescriptions  Medication Sig Dispense Refill  . acetaminophen (TYLENOL) 650 MG CR tablet Take 1,300 mg by mouth every 8 (eight) hours.    Marland Kitchen amiodarone (PACERONE) 200 MG tablet TAKE 1 TABLET BY MOUTH  DAILY 90 tablet 2  . calcium citrate-vitamin D (CITRACAL+D) 315-200 MG-UNIT tablet Take 1 tablet by mouth 2 (two) times daily.     . Cyanocobalamin (B-12 PO) Take 1 tablet by mouth daily as needed (for energy).     Marland Kitchen diltiazem (CARDIZEM CD) 360 MG 24 hr capsule TAKE 1 CAPSULE BY MOUTH  DAILY 90 capsule 3  . ELIQUIS 5 MG TABS tablet TAKE 1 TABLET BY MOUTH TWO  TIMES DAILY 180 tablet 3  . ferrous sulfate 325 (65 FE) MG tablet Take 325 mg by mouth daily with breakfast.    . fluticasone (FLONASE) 50 MCG/ACT nasal spray Place 1 spray into both nostrils daily as needed (for allergies.). Reported on 02/25/2016    . hydrochlorothiazide (MICROZIDE) 12.5 MG capsule Take 1 tablet by mouth Monday-Friday only 90 capsule 3  . lansoprazole (PREVACID) 30 MG capsule Take 1 capsule (30 mg total) by mouth 2 (two) times daily before a meal. 180 capsule 2  . levothyroxine  (SYNTHROID, LEVOTHROID) 175 MCG tablet Take 175 mcg by mouth daily before breakfast.    . LORazepam (ATIVAN) 1 MG tablet Take 1.5 mg by mouth at bedtime.    Marland Kitchen losartan (COZAAR) 25 MG tablet Take 1 tablet (25 mg total) by mouth daily. 90 tablet 3  . Lutein 6 MG CAPS Take 6 mg by mouth 2 (two) times daily as needed (for vision support).     . metFORMIN (GLUMETZA) 500 MG (MOD) 24 hr tablet Take 500 mg by mouth 2 (two) times daily with a meal.     . Multiple Vitamins-Minerals (OCUVITE PRESERVISION PO) Take 1 tablet by mouth daily.    . ONE TOUCH ULTRA TEST test  strip 1 each by Other route 3 (three) times daily. Use as directed    . ONETOUCH DELICA LANCETS 99B MISC 1 each by Other route 3 (three) times daily. Use as directed    . Polyvinyl Alcohol-Povidone PF (REFRESH) 1.4-0.6 % SOLN Place 1 drop into both eyes at bedtime.    . potassium chloride (KLOR-CON) 20 MEQ packet Take by mouth daily.    . rosuvastatin (CRESTOR) 20 MG tablet Take 10 mg by mouth at bedtime.    . sertraline (ZOLOFT) 50 MG tablet Take 50 mg by mouth daily.     No current facility-administered medications for this encounter.     Allergies  Allergen Reactions  . Penicillins Itching and Rash    Has patient had a PCN reaction causing immediate rash, facial/tongue/throat swelling, SOB or lightheadedness with hypotension: Unknown, childhood reaction Has patient had a PCN reaction causing severe rash involving mucus membranes or skin necrosis: No Has patient had a PCN reaction that required hospitalization No Has patient had a PCN reaction occurring within the last 10 years: No If all of the above answers are "NO", then may proceed with Cephalosporin use.     Social History   Social History  . Marital status: Divorced    Spouse name: N/A  . Number of children: 2  . Years of education: N/A   Occupational History  . Retired    Social History Main Topics  . Smoking status: Never Smoker  . Smokeless tobacco: Never Used    . Alcohol use No  . Drug use: No  . Sexual activity: No   Other Topics Concern  . Not on file   Social History Narrative  . No narrative on file    Family History  Problem Relation Age of Onset  . Prostate cancer Father   . Diabetes Sister   . Heart disease Sister   . Heart attack Brother   . Heart disease Brother   . Diabetes Brother   . Diabetes Sister   . Diabetes Sister   . Pancreatic cancer Sister   . Diabetes Brother   . Heart disease Brother   . Diabetes Brother   . Heart disease Brother   . Diabetes Brother   . Heart disease Brother   . Diabetes Brother   . Heart disease Brother   . Diabetes Brother   . Heart disease Brother   . Diabetes Brother     ROS- All systems are reviewed and negative except as per the HPI above  Physical Exam: Vitals:   09/11/17 0831  BP: 114/64  Pulse: (!) 124  Weight: 179 lb 6.4 oz (81.4 kg)  Height: 5\' 6"  (1.676 m)   Wt Readings from Last 3 Encounters:  09/11/17 179 lb 6.4 oz (81.4 kg)  09/05/17 178 lb (80.7 kg)  07/06/17 178 lb (80.7 kg)    Labs: Lab Results  Component Value Date   NA 141 09/05/2017   K 4.3 09/05/2017   CL 101 09/05/2017   CO2 24 09/05/2017   GLUCOSE 113 (H) 09/05/2017   BUN 18 09/05/2017   CREATININE 0.81 09/05/2017   CALCIUM 10.0 09/05/2017   MG 2.2 06/07/2017   Lab Results  Component Value Date   INR 1.12 04/20/2016   No results found for: CHOL, HDL, LDLCALC, TRIG   GEN- The patient is well appearing, alert and oriented x 3 today.   Head- normocephalic, atraumatic Eyes-  Sclera clear, conjunctiva pink Ears- hearing intact Oropharynx- clear Neck- supple,  no JVP Lymph- no cervical lymphadenopathy Lungs- Clear to ausculation bilaterally, normal work of breathing Heart- irregular rate and rhythm, no murmurs, rubs or gallops, PMI not laterally displaced GI- soft, NT, ND, + BS Extremities- no clubbing, cyanosis, or edema MS- no significant deformity or atrophy Skin- no rash or  lesion Psych- euthymic mood, full affect Neuro- strength and sensation are intact  EKG- NSR at 73 bpm, pr int 186 ms, qrs int 102 ms, qtc 460 ms Epic records reviewed Cxr-IMPRESSION: There is a large hiatal hernia with worsening gaseous distention. The hernia measures at least 9.8 x 9.4 cm. Moderate gas noted within esophagus. No infiltrate or pulmonary edema. Surgical clips are noted in right axilla.  Labs- 10/24-TSH/liver recently checked and WNL, CBC wnl, BMET with glucose of 113 otherwise unremarkable   Assessment and Plan: 1. Persistent afib  Has resumed amiodarone to 200 mg a day Will plan on cardioversion in about a week to let amio load a little longer Will discuss with pulmonologist at her next visit, 11/13, if she can continue on amiodarone, by last CT 3/18, no toxicity changes were seen Continue cardizem 360 mg qd Continue eliquis 5 mg bid for chadsvasc score of at least 5, states no missed doses Labs have been drawn within 2 weeks of cardioversion and are acceptable  2. Persistent cough Improved with HH repair  Will f/u in afib clinic after cardioversion at which time she will have seen pulmonology, we can further discuss  if she will stay on amiodarone or further discuss other otptions   Butch Penny C. Tanza Pellot, [facility] [address] [place], [place] [address] [phone]

## 2017-09-11 NOTE — Patient Instructions (Signed)
Your physician has recommended you make the following change in your medication:  1)Continue Amiodarone 200mg  once a day  Cardioversion scheduled for Wednesday, November 7th  - Arrive at the [facility] and go to admitting at 12:30pm  -Do not eat or drink anything after midnight the night prior to your procedure.  - Take all your medication with a sip of water prior to arrival.  - Do not miss any doses of your blood thinner.  - You will not be able to drive home after your procedure.

## 2017-09-11 NOTE — H&P (View-Only) (Signed)
Primary Care Physician: Lavone Orn, MD Cardiologist: [name] Referral provider: Truitt Merle, NP Pulmonologist: Dr. Elsworth Soho GI: Dr. Dolphus Jenny I Busic is a 83 y.o. female with a h/o afib that has been on amiodarone since June of 2015. She had afib 12/2016, in the setting of persistent cough x 6-8 weeks  that had not responded to two rounds of prednisone and antibiotics. She had also been using cough syrup with decongestants. Her Cardizem was increased and  BB was added with good slowing of v rate and with slower heart rate, she does feel better but persists in afib. She is not really wanting a cardioversion. Her biggest concern was  cough. She did have a CXR and other than a large hiatal hernia, there were no other abnormalities noted. TSH had also been mildly elevated but PCP aware and was monitoring. .Continues on eliquis. She did have repair of Hiatal hernia 05/25/17.  She is in the  afib clinic for return to afib 7-10 days ago. She had been trying to wean off amiodarone with concerns re her lungs and her eye doctor could see evidence of amio use, but she states that her vision had not changed. CT chest in March did not show any evidence of lung toxicity for amiodarone. She is pending another eval with lung doctor 11/13.  She increased her amio back to 200 mg a day about 10 days ago.  We discussed options to amiodarone, with either Tikosyn or sotalol and would have to let amio wash out and may take 2-3 months before eligible for the other drugs. She does not think she can tolerate afib for that period of time. We also discussed possible av nodal ablation and PPM but she does not like this option as well. For now she is happy to use the amiodarone. No missed doses of eliquis.She is tolerating the afib   Today, she denies symptoms of chest pain, shortness of breath, orthopnea, PND, lower extremity edema, dizziness, presyncope, syncope, or neurologic sequela.Perisistent cough. The patient  is tolerating medications without difficulties and is otherwise without complaint today.   Past Medical History:  Diagnosis Date  . ADENOCARCINOMA, BREAST 10/13/2010   Qualifier: Diagnosis of  By: Nils Pyle CMA (AAMA), Mearl Latin    . Allergy    SEASONAL  . Anemia    years ago  . Arthritis    "all my joints" (03/31/2014)  . Atrial fibrillation ([place])   . Atrial fibrillation with RVR ([place]) 03/31/2015   DLCO dropped from 100% in 2015-50% in 01/2017  . Barrett's esophagus   . Breast cancer [facility])    s/p right breast lumpectomy  . Cervical cancer ([place])   . DJD (degenerative joint disease) of knee   . Dysrhythmia    Afib  . GERD (gastroesophageal reflux disease)   . Glaucoma   . H/O hiatal hernia   . Heart murmur   . Hx of transesophageal echocardiography (TEE) for monitoring    TEE (03/2014): No LAA clot, moderate LAE, core triatriatum type structure in RA with no stenosis, normal EF 55-60%, mild MR  . Hypertension   . Hypothyroidism, postsurgical   . Migraine    "last one was years ago" (03/31/2014)  . OSA on CPAP    settings at 10-11   . RECTAL FISSURE 10/13/2010   Qualifier: Diagnosis of  By: Nils Pyle CMA ([place]), Mearl Latin    . Sleep apnea   . Thyroid cancer [facility])    s/p thyroidectomy  . Type  II diabetes mellitus ([place])    type II    Past Surgical History:  Procedure Laterality Date  . ABDOMINAL HYSTERECTOMY     "partial"  . BREAST BIOPSY Right   . BREAST LUMPECTOMY Right   . CARDIOVERSION N/A 03/30/2014   Procedure: CARDIOVERSION - BEDSIDE;  Surgeon: [name], MD;  Location: [place];  Service: Cardiovascular;  Laterality: N/A;  . CARDIOVERSION N/A 03/31/2014   Procedure: CARDIOVERSION  (BEDSIDE) ;  Surgeon: Lelon Perla, MD;  Location: [place];  Service: Cardiovascular;  Laterality: N/A;  . CARDIOVERSION N/A 04/27/2014   Procedure: CARDIOVERSION;  Surgeon: Darlin Coco, MD;  Location[facility] ENDOSCOPY;  Service: Cardiovascular;  Laterality: N/A;  . CARDIOVERSION N/A 04/13/2015    Procedure: CARDIOVERSION;  Surgeon: Thayer Headings, MD;  Location: [place];  Service: Cardiovascular;  Laterality: N/A;  . CARDIOVERSION N/A 01/08/2017   Procedure: CARDIOVERSION;  Surgeon: Sanda Klein, MD;  Location: [facility]OS[facility];  Service: Cardiovascular;  Laterality: N/A;  . CARPAL TUNNEL RELEASE Right   . CATARACT EXTRACTION W/ INTRAOCULAR LENS  IMPLANT, BILATERAL Bilateral   . COLONOSCOPY    . EXCISIONAL HEMORRHOIDECTOMY    . INSERTION OF MESH N/A 05/25/2017   Procedure: INSERTION OF MESH;  Surgeon: Ralene Ok, MD;  [facility];  Service: General;  Laterality: N/A;  . TEE WITHOUT CARDIOVERSION N/A 03/30/2014   Procedure: TRANSESOPHAGEAL ECHOCARDIOGRAM (TEE);  Surgeon: [name], MD;  Location: [facility] END[facility]COPY;  Service: Cardiovascular;  Laterality: N/A;  . THYROIDECTOMY    . TONSILLECTOMY      Current Outpatient Prescriptions  Medication Sig Dispense Refill  . acetaminophen (TYLENOL) 650 MG CR tablet Take 1,300 mg by mouth every 8 (eight) hours.    Marland Kitchen amiodarone (PACERONE) 200 MG tablet TAKE 1 TABLET BY MOUTH  DAILY 90 tablet 2  . calcium citrate-vitamin D (CITRACAL+D) 315-200 MG-UNIT tablet Take 1 tablet by mouth 2 (two) times daily.     . Cyanocobalamin (B-12 PO) Take 1 tablet by mouth daily as needed (for energy).     Marland Kitchen diltiazem (CARDIZEM CD) 360 MG 24 hr capsule TAKE 1 CAPSULE BY MOUTH  DAILY 90 capsule 3  . ELIQUIS 5 MG TABS tablet TAKE 1 TABLET BY MOUTH TWO  TIMES DAILY 180 tablet 3  . ferrous sulfate 325 (65 FE) MG tablet Take 325 mg by mouth daily with breakfast.    . fluticasone (FLONASE) 50 MCG/ACT nasal spray Place 1 spray into both nostrils daily as needed (for allergies.). Reported on 02/25/2016    . hydrochlorothiazide (MICROZIDE) 12.5 MG capsule Take 1 tablet by mouth Monday-Friday only 90 capsule 3  . lansoprazole (PREVACID) 30 MG capsule Take 1 capsule (30 mg total) by mouth 2 (two) times daily before a meal. 180 capsule 2  . levothyroxine  (SYNTHROID, LEVOTHROID) 175 MCG tablet Take 175 mcg by mouth daily before breakfast.    . LORazepam (ATIVAN) 1 MG tablet Take 1.5 mg by mouth at bedtime.    Marland Kitchen losartan (COZAAR) 25 MG tablet Take 1 tablet (25 mg total) by mouth daily. 90 tablet 3  . Lutein 6 MG CAPS Take 6 mg by mouth 2 (two) times daily as needed (for vision support).     . metFORMIN (GLUMETZA) 500 MG (MOD) 24 hr tablet Take 500 mg by mouth 2 (two) times daily with a meal.     . Multiple Vitamins-Minerals (OCUVITE PRESERVISION PO) Take 1 tablet by mouth daily.    . ONE TOUCH ULTRA TEST test  strip 1 each by Other route 3 (three) times daily. Use as directed    . ONETOUCH DELICA LANCETS 96Q MISC 1 each by Other route 3 (three) times daily. Use as directed    . Polyvinyl Alcohol-Povidone PF (REFRESH) 1.4-0.6 % SOLN Place 1 drop into both eyes at bedtime.    . potassium chloride (KLOR-CON) 20 MEQ packet Take by mouth daily.    . rosuvastatin (CRESTOR) 20 MG tablet Take 10 mg by mouth at bedtime.    . sertraline (ZOLOFT) 50 MG tablet Take 50 mg by mouth daily.     No current facility-administered medications for this encounter.     Allergies  Allergen Reactions  . Penicillins Itching and Rash    Has patient had a PCN reaction causing immediate rash, facial/tongue/throat swelling, SOB or lightheadedness with hypotension: Unknown, childhood reaction Has patient had a PCN reaction causing severe rash involving mucus membranes or skin necrosis: No Has patient had a PCN reaction that required hospitalization No Has patient had a PCN reaction occurring within the last 10 years: No If all of the above answers are "NO", then may proceed with Cephalosporin use.     Social History   Social History  . Marital status: Divorced    Spouse name: N/A  . Number of children: 2  . Years of education: N/A   Occupational History  . Retired    Social History Main Topics  . Smoking status: Never Smoker  . Smokeless tobacco: Never Used    . Alcohol use No  . Drug use: No  . Sexual activity: No   Other Topics Concern  . Not on file   Social History Narrative  . No narrative on file    Family History  Problem Relation Age of Onset  . Prostate cancer Father   . Diabetes Sister   . Heart disease Sister   . Heart attack Brother   . Heart disease Brother   . Diabetes Brother   . Diabetes Sister   . Diabetes Sister   . Pancreatic cancer Sister   . Diabetes Brother   . Heart disease Brother   . Diabetes Brother   . Heart disease Brother   . Diabetes Brother   . Heart disease Brother   . Diabetes Brother   . Heart disease Brother   . Diabetes Brother   . Heart disease Brother   . Diabetes Brother     ROS- All systems are reviewed and negative except as per the HPI above  Physical Exam: Vitals:   09/11/17 0831  BP: 114/64  Pulse: (!) 124  Weight: 179 lb 6.4 oz (81.4 kg)  Height: 5\' 6"  (1.676 m)   Wt Readings from Last 3 Encounters:  09/11/17 179 lb 6.4 oz (81.4 kg)  09/05/17 178 lb (80.7 kg)  07/06/17 178 lb (80.7 kg)    Labs: Lab Results  Component Value Date   NA 141 09/05/2017   K 4.3 09/05/2017   CL 101 09/05/2017   CO2 24 09/05/2017   GLUCOSE 113 (H) 09/05/2017   BUN 18 09/05/2017   CREATININE 0.81 09/05/2017   CALCIUM 10.0 09/05/2017   MG 2.2 06/07/2017   Lab Results  Component Value Date   INR 1.12 04/20/2016   No results found for: CHOL, HDL, LDLCALC, TRIG   GEN- The patient is well appearing, alert and oriented x 3 today.   Head- normocephalic, atraumatic Eyes-  Sclera clear, conjunctiva pink Ears- hearing intact Oropharynx- clear Neck- supple,  no JVP Lymph- no cervical lymphadenopathy Lungs- Clear to ausculation bilaterally, normal work of breathing Heart- irregular rate and rhythm, no murmurs, rubs or gallops, PMI not laterally displaced GI- soft, NT, ND, + BS Extremities- no clubbing, cyanosis, or edema MS- no significant deformity or atrophy Skin- no rash or  lesion Psych- euthymic mood, full affect Neuro- strength and sensation are intact  EKG- NSR at 73 bpm, pr int 186 ms, qrs int 102 ms, qtc 460 ms Epic records reviewed Cxr-IMPRESSION: There is a large hiatal hernia with worsening gaseous distention. The hernia measures at least 9.8 x 9.4 cm. Moderate gas noted within esophagus. No infiltrate or pulmonary edema. Surgical clips are noted in right axilla.  Labs- 10/24-TSH/liver recently checked and WNL, CBC wnl, BMET with glucose of 113 otherwise unremarkable   Assessment and Plan: 1. Persistent afib  Has resumed amiodarone to 200 mg a day Will plan on cardioversion in about a week to let amio load a little longer Will discuss with pulmonologist at her next visit, 11/13, if she can continue on amiodarone, by last CT 3/18, no toxicity changes were seen Continue cardizem 360 mg qd Continue eliquis 5 mg bid for chadsvasc score of at least 5, states no missed doses Labs have been drawn within 2 weeks of cardioversion and are acceptable  2. Persistent cough Improved with HH repair  Will f/u in afib clinic after cardioversion at which time she will have seen pulmonology, we can further discuss  if she will stay on amiodarone or further discuss other otptions   Butch Penny C. [place], [facility] [address] [place], [place] [address] [phone]

## 2017-09-13 ENCOUNTER — Ambulatory Visit (HOSPITAL_COMMUNITY): Payer: Medicare Other | Admitting: Nurse Practitioner

## 2017-09-18 ENCOUNTER — Telehealth: Payer: Self-pay | Admitting: Cardiovascular Disease

## 2017-09-18 NOTE — Telephone Encounter (Signed)
Cardioversion instructions reviewed with patient. Verbalized understanding.

## 2017-09-18 NOTE — Telephone Encounter (Signed)
Patient calling states that she needs more information about her procedure scheduled for tomorrow.

## 2017-09-19 ENCOUNTER — Ambulatory Visit (HOSPITAL_COMMUNITY): Payer: Medicare Other | Admitting: Anesthesiology

## 2017-09-19 ENCOUNTER — Encounter (HOSPITAL_COMMUNITY): Payer: Self-pay | Admitting: *Deleted

## 2017-09-19 ENCOUNTER — Ambulatory Visit (HOSPITAL_COMMUNITY)
Admission: RE | Admit: 2017-09-19 | Discharge: 2017-09-19 | Disposition: A | Discharge status: Home / Self Care | Payer: Medicare Other | Source: Ambulatory Visit | Attending: Cardiovascular Disease | Admitting: Cardiovascular Disease

## 2017-09-19 ENCOUNTER — Other Ambulatory Visit: Payer: Self-pay | Admitting: Cardiovascular Disease

## 2017-09-19 ENCOUNTER — Encounter (HOSPITAL_COMMUNITY)
Admission: RE | Disposition: A | Discharge status: Home / Self Care | Payer: Self-pay | Source: Ambulatory Visit | Attending: Cardiovascular Disease

## 2017-09-19 DIAGNOSIS — E119 Type 2 diabetes mellitus without complications: Secondary | ICD-10-CM | POA: Insufficient documentation

## 2017-09-19 DIAGNOSIS — Z85850 Personal history of malignant neoplasm of thyroid: Secondary | ICD-10-CM | POA: Diagnosis not present

## 2017-09-19 DIAGNOSIS — G4733 Obstructive sleep apnea (adult) (pediatric): Secondary | ICD-10-CM | POA: Diagnosis not present

## 2017-09-19 DIAGNOSIS — Z79899 Other long term (current) drug therapy: Secondary | ICD-10-CM | POA: Insufficient documentation

## 2017-09-19 DIAGNOSIS — K219 Gastro-esophageal reflux disease without esophagitis: Secondary | ICD-10-CM | POA: Diagnosis not present

## 2017-09-19 DIAGNOSIS — M1990 Unspecified osteoarthritis, unspecified site: Secondary | ICD-10-CM | POA: Diagnosis not present

## 2017-09-19 DIAGNOSIS — Z7984 Long term (current) use of oral hypoglycemic drugs: Secondary | ICD-10-CM | POA: Insufficient documentation

## 2017-09-19 DIAGNOSIS — E039 Hypothyroidism, unspecified: Secondary | ICD-10-CM | POA: Insufficient documentation

## 2017-09-19 DIAGNOSIS — Z79890 Hormone replacement therapy (postmenopausal): Secondary | ICD-10-CM | POA: Diagnosis not present

## 2017-09-19 DIAGNOSIS — Z853 Personal history of malignant neoplasm of breast: Secondary | ICD-10-CM | POA: Diagnosis not present

## 2017-09-19 DIAGNOSIS — Z880 Allergy status to penicillin: Secondary | ICD-10-CM | POA: Insufficient documentation

## 2017-09-19 DIAGNOSIS — F419 Anxiety disorder, unspecified: Secondary | ICD-10-CM | POA: Diagnosis not present

## 2017-09-19 DIAGNOSIS — Z8541 Personal history of malignant neoplasm of cervix uteri: Secondary | ICD-10-CM | POA: Insufficient documentation

## 2017-09-19 DIAGNOSIS — I481 Persistent atrial fibrillation: Secondary | ICD-10-CM

## 2017-09-19 DIAGNOSIS — H409 Unspecified glaucoma: Secondary | ICD-10-CM | POA: Diagnosis not present

## 2017-09-19 DIAGNOSIS — I10 Essential (primary) hypertension: Secondary | ICD-10-CM | POA: Diagnosis not present

## 2017-09-19 DIAGNOSIS — Z7901 Long term (current) use of anticoagulants: Secondary | ICD-10-CM | POA: Diagnosis not present

## 2017-09-19 HISTORY — PX: CARDIOVERSION: SHX1299

## 2017-09-19 LAB — GLUCOSE, CAPILLARY: GLUCOSE-CAPILLARY: 116 mg/dL — AB (ref 65–99)

## 2017-09-19 SURGERY — CARDIOVERSION
Anesthesia: General

## 2017-09-19 MED ORDER — PROPOFOL 10 MG/ML IV BOLUS
INTRAVENOUS | Status: DC | PRN
  Administered 2017-09-19: 50 mg via INTRAVENOUS
  Administered 2017-09-19: 20 mg via INTRAVENOUS

## 2017-09-19 MED ORDER — SODIUM CHLORIDE 0.9 % IV SOLN
INTRAVENOUS | Status: DC
  Administered 2017-09-19: 14:00:00 via INTRAVENOUS

## 2017-09-19 MED ORDER — LIDOCAINE HCL (CARDIAC) 20 MG/ML IV SOLN
INTRAVENOUS | Status: DC | PRN
  Administered 2017-09-19: 60 mg via INTRAVENOUS

## 2017-09-19 NOTE — CV Procedure (Signed)
[facility]: Anesthesia Dr Gifford Shave 70 mg propofol 60 mg Lidocaine On Rx Eliquis no missed doses  [facility] x 1 120 Joules biphasic  Converted from afib rate 110 to NSR rate 68 bpm  No immediate neurologic sequelae  Jenkins Rouge

## 2017-09-19 NOTE — Transfer of Care (Signed)
Immediate Anesthesia Transfer of Care Note  Patient: [name]  Procedure(s) Performed: CARDIOVERSION (N/A )  Patient Location: Endoscopy Unit  Anesthesia Type:General  Level of Consciousness: awake and alert   Airway & Oxygen Therapy: Patient Spontanous Breathing and Patient connected to nasal cannula oxygen  Post-op Assessment: Report given to RN and Post -op Vital signs reviewed and stable  Post vital signs: Reviewed and stable  Last Vitals:  Vitals:   09/19/17 1401 09/19/17 1402  BP:    Pulse: 65 62  Resp: (!) 21 19  Temp:    SpO2: 91% 99%    Last Pain:  Vitals:   09/19/17 1252  TempSrc: Oral         Complications: No apparent anesthesia complications

## 2017-09-19 NOTE — Telephone Encounter (Signed)
Eliquis 5mg  refill request received; pt is 83 yrs old, wt-80.7kg, Crea-0.81 on 09/02/17, last seen by Roderic Palau on 09/11/17 & Truitt Merle on 09/11/17. Will send in refill to requested pharmacy.

## 2017-09-19 NOTE — Discharge Instructions (Signed)
Electrical Cardioversion, Care After °This sheet gives you information about how to care for yourself after your procedure. Your health care provider may also give you more specific instructions. If you have problems or questions, contact your health care provider. °What can I expect after the procedure? °After the procedure, it is common to have: °· Some redness on the skin where the shocks were given. ° °Follow these instructions at home: °· Do not drive for 24 hours if you were given a medicine to help you relax (sedative). °· Take over-the-counter and prescription medicines only as told by your health care provider. °· Ask your health care provider how to check your pulse. Check it often. °· Rest for 48 hours after the procedure or as told by your health care provider. °· Avoid or limit your caffeine use as told by your health care provider. °Contact a health care provider if: °· You feel like your heart is beating too quickly or your pulse is not regular. °· You have a serious muscle cramp that does not go away. °Get help right away if: °· You have discomfort in your chest. °· You are dizzy or you feel faint. °· You have trouble breathing or you are short of breath. °· Your speech is slurred. °· You have trouble moving an arm or leg on one side of your body. °· Your fingers or toes turn cold or blue. °This information is not intended to replace advice given to you by your health care provider. Make sure you discuss any questions you have with your health care provider. °Document Released: 08/20/2013 Document Revised: 06/02/2016 Document Reviewed: 05/05/2016 °Elsevier Interactive Patient Education © 2018 [employer] ° °

## 2017-09-19 NOTE — Interval H&P Note (Signed)
History and Physical Interval Note:  09/19/2017 1:05 PM  [name]  has presented today for surgery, with the diagnosis of AFIB  The various methods of treatment have been discussed with the patient and family. After consideration of risks, benefits and other options for treatment, the patient has consented to  Procedure(s): CARDIOVERSION (N/A) as a surgical intervention .  The patient's history has been reviewed, patient examined, no change in status, stable for surgery.  I have reviewed the patient's chart and labs.  Questions were answered to the patient's satisfaction.     Jenkins Rouge

## 2017-09-19 NOTE — Anesthesia Postprocedure Evaluation (Signed)
Anesthesia Post Note  Patient: [name]  Procedure(s) Performed: CARDIOVERSION (N/A )     Patient location during evaluation: Endoscopy Anesthesia Type: General Level of consciousness: awake and alert Pain management: pain level controlled Vital Signs Assessment: post-procedure vital signs reviewed and stable Respiratory status: spontaneous breathing, nonlabored ventilation, respiratory function stable and patient connected to nasal cannula oxygen Cardiovascular status: blood pressure returned to baseline and stable Postop Assessment: no apparent nausea or vomiting Anesthetic complications: no Comments: No antiemetics given due to brief procedure with propofol, and no patient complaint of nausea/vomiting.     Last Vitals:  Vitals:   09/19/17 1402 09/19/17 1404  BP:  127/69  Pulse: 62 65  Resp: 19 (!) 25  Temp:  36.6 C  SpO2: 99% 97%    Last Pain:  Vitals:   09/19/17 1404  TempSrc: Oral                 Catalina Gravel

## 2017-09-19 NOTE — Anesthesia Preprocedure Evaluation (Addendum)
Anesthesia Evaluation  Patient identified by MRN, date of birth, ID band Patient awake    Reviewed: Allergy & Precautions, NPO status , Patient's Chart, lab work & pertinent test results  Airway Mallampati: II  TM Distance: >3 FB Neck ROM: Full    Dental  (+) Teeth Intact, Dental Advisory Given, Caps   Pulmonary sleep apnea and Continuous Positive Airway Pressure Ventilation ,    Pulmonary exam normal breath sounds clear to auscultation       Cardiovascular hypertension, + dysrhythmias Atrial Fibrillation  Rhythm:Irregular Rate:Normal     Neuro/Psych  Headaches, PSYCHIATRIC DISORDERS Anxiety    GI/Hepatic GERD  ,  Endo/Other  diabetes, Type 2, Oral Hypoglycemic AgentsHypothyroidism   Renal/GU      Musculoskeletal  (+) Arthritis ,   Abdominal   Peds  Hematology  (+) Blood dyscrasia (Eliquis), ,   Anesthesia Other Findings   Reproductive/Obstetrics                            Anesthesia Physical Anesthesia Plan  ASA: III  Anesthesia Plan: General   Post-op Pain Management:    Induction: Intravenous  PONV Risk Score and Plan: 3 and Treatment may vary due to age or medical condition  Airway Management Planned: Mask  Additional Equipment:   Intra-op Plan:   Post-operative Plan:   Informed Consent: I have reviewed the patients History and Physical, chart, labs and discussed the procedure including the risks, benefits and alternatives for the proposed anesthesia with the patient or authorized representative who has indicated his/her understanding and acceptance.     Plan Discussed with:   Anesthesia Plan Comments:        Anesthesia Quick Evaluation

## 2017-09-21 ENCOUNTER — Encounter (HOSPITAL_COMMUNITY): Payer: Self-pay | Admitting: Cardiovascular Disease

## 2017-09-24 ENCOUNTER — Encounter (HOSPITAL_COMMUNITY): Payer: Self-pay | Admitting: Nurse Practitioner

## 2017-09-24 ENCOUNTER — Ambulatory Visit (HOSPITAL_COMMUNITY)
Admission: RE | Admit: 2017-09-24 | Discharge: 2017-09-24 | Disposition: A | Discharge status: Home / Self Care | Payer: Medicare Other | Source: Ambulatory Visit | Attending: Nurse Practitioner | Admitting: Nurse Practitioner

## 2017-09-24 VITALS — BP 126/72 | HR 111 | Ht 66.0 in | Wt 178.8 lb

## 2017-09-24 DIAGNOSIS — H409 Unspecified glaucoma: Secondary | ICD-10-CM | POA: Diagnosis not present

## 2017-09-24 DIAGNOSIS — K219 Gastro-esophageal reflux disease without esophagitis: Secondary | ICD-10-CM | POA: Diagnosis not present

## 2017-09-24 DIAGNOSIS — Z8541 Personal history of malignant neoplasm of cervix uteri: Secondary | ICD-10-CM | POA: Diagnosis not present

## 2017-09-24 DIAGNOSIS — Z880 Allergy status to penicillin: Secondary | ICD-10-CM | POA: Insufficient documentation

## 2017-09-24 DIAGNOSIS — Z9841 Cataract extraction status, right eye: Secondary | ICD-10-CM | POA: Insufficient documentation

## 2017-09-24 DIAGNOSIS — Z7984 Long term (current) use of oral hypoglycemic drugs: Secondary | ICD-10-CM | POA: Insufficient documentation

## 2017-09-24 DIAGNOSIS — Z853 Personal history of malignant neoplasm of breast: Secondary | ICD-10-CM | POA: Diagnosis not present

## 2017-09-24 DIAGNOSIS — D649 Anemia, unspecified: Secondary | ICD-10-CM | POA: Diagnosis not present

## 2017-09-24 DIAGNOSIS — E890 Postprocedural hypothyroidism: Secondary | ICD-10-CM | POA: Diagnosis not present

## 2017-09-24 DIAGNOSIS — R05 Cough: Secondary | ICD-10-CM | POA: Insufficient documentation

## 2017-09-24 DIAGNOSIS — I481 Persistent atrial fibrillation: Secondary | ICD-10-CM | POA: Insufficient documentation

## 2017-09-24 DIAGNOSIS — K2270 Barrett's esophagus without dysplasia: Secondary | ICD-10-CM | POA: Insufficient documentation

## 2017-09-24 DIAGNOSIS — K449 Diaphragmatic hernia without obstruction or gangrene: Secondary | ICD-10-CM | POA: Insufficient documentation

## 2017-09-24 DIAGNOSIS — E119 Type 2 diabetes mellitus without complications: Secondary | ICD-10-CM | POA: Diagnosis not present

## 2017-09-24 DIAGNOSIS — Z7901 Long term (current) use of anticoagulants: Secondary | ICD-10-CM | POA: Insufficient documentation

## 2017-09-24 DIAGNOSIS — I10 Essential (primary) hypertension: Secondary | ICD-10-CM | POA: Diagnosis not present

## 2017-09-24 DIAGNOSIS — Z79899 Other long term (current) drug therapy: Secondary | ICD-10-CM | POA: Diagnosis not present

## 2017-09-24 DIAGNOSIS — I4819 Other persistent atrial fibrillation: Secondary | ICD-10-CM

## 2017-09-24 DIAGNOSIS — Z85850 Personal history of malignant neoplasm of thyroid: Secondary | ICD-10-CM | POA: Diagnosis not present

## 2017-09-24 MED ORDER — METOPROLOL TARTRATE 25 MG PO TABS: 12.5000 mg | ORAL_TABLET | Freq: Two times a day (BID) | ORAL | 3 refills | Status: DC

## 2017-09-24 NOTE — Progress Notes (Addendum)
Primary Care Physician: Lavone Orn, MD Cardiologist: [name] Referral provider: Truitt Merle, NP Pulmonologist: Dr. Elsworth Soho GI: Dr. Dolphus Jenny I Clewis is a 83 y.o. female with a h/o afib that has been on amiodarone since June of 2015. She had afib 12/2016, in the setting of persistent cough x 6-8 weeks  that had not responded to two rounds of prednisone and antibiotics. She had also been using cough syrup with decongestants. Her Cardizem was increased and  BB was added with good slowing of v rate and with slower heart rate, she does feel better but persists in afib. She is not really wanting a cardioversion. Her biggest concern was  cough. She did have a CXR and other than a large hiatal hernia, there were no other abnormalities noted. TSH had also been mildly elevated but PCP aware and was monitoring. .Continues on eliquis. She did have repair of Hiatal hernia 05/25/17.  She is in the  afib clinic for return to afib 7-10 days ago. She had been trying to wean off amiodarone with concerns re her lungs and her eye doctor could see evidence of amio use, but she states that her vision had not changed. CT chest in March did not show any evidence of lung toxicity for amiodarone. She is pending another eval with lung doctor 11/13.  She increased her amio back to 200 mg a day about 10 days ago.  We discussed options to amiodarone, with either Tikosyn or sotalol and would have to let amio wash out and may take 2-3 months before eligible for the other drugs. She does not think she can tolerate afib for that period of time. We also discussed possible av nodal ablation and PPM but she does not like this option as well. For now she is happy to use the amiodarone. No missed doses of eliquis.She is tolerating the afib.  F/u in the afib clinic 11/12. She had successful cardioversion 11/7, but went back into afib this weekend. She  states that it is because her grandson came by 3 different times recently  asking for money and he stressed her out too much, causing her to go back into afib.. She tells me in the past, another time she went into afib  because a daughter asked her for $7000. She does not feel that different in afib, noticed it because of increase in her HR to around 100. She was suppose to f/u with Pulmonologist tomorrow, but moved appointment into December as she felt she would not be able to do PFT's in afib.  Today, she denies symptoms of chest pain, shortness of breath, orthopnea, PND, lower extremity edema, dizziness, presyncope, syncope, or neurologic sequela.Perisistent cough. The patient is tolerating medications without difficulties and is otherwise without complaint today.   Past Medical History:  Diagnosis Date  . ADENOCARCINOMA, BREAST 10/13/2010   Qualifier: Diagnosis of  By: Nils Pyle CMA (AAMA), Mearl Latin    . Allergy    SEASONAL  . Anemia    years ago  . Arthritis    "all my joints" (03/31/2014)  . Atrial fibrillation ([place])   . Atrial fibrillation with RVR ([place]) 03/31/2015   DLCO dropped from 100% in 2015-50% in 01/2017  . Barrett's esophagus   . Breast cancer [facility])    s/p right breast lumpectomy  . Cervical cancer ([place])   . DJD (degenerative joint disease) of knee   . Dysrhythmia    Afib  . GERD (gastroesophageal reflux disease)   .  Glaucoma   . H/O hiatal hernia   . Heart murmur   . Hx of transesophageal echocardiography (TEE) for monitoring    TEE (03/2014): No LAA clot, moderate LAE, core triatriatum type structure in RA with no stenosis, normal EF 55-60%, mild MR  . Hypertension   . Hypothyroidism, postsurgical   . Migraine    "last one was years ago" (03/31/2014)  . OSA on CPAP    settings at 10-11   . RECTAL FISSURE 10/13/2010   Qualifier: Diagnosis of  By: Nils Pyle CMA ([place]), Mearl Latin    . Sleep apnea   . Thyroid cancer [facility])    s/p thyroidectomy  . Type II diabetes mellitus ([place])    type II    Past Surgical History:  Procedure Laterality Date  .  ABDOMINAL HYSTERECTOMY     "partial"  . BREAST BIOPSY Right   . BREAST LUMPECTOMY Right   . CARPAL TUNNEL RELEASE Right   . CATARACT EXTRACTION W/ INTRAOCULAR LENS  IMPLANT, BILATERAL Bilateral   . COLONOSCOPY    . EXCISIONAL HEMORRHOIDECTOMY    . THYROIDECTOMY    . TONSILLECTOMY      Current Outpatient Medications  Medication Sig Dispense Refill  . acetaminophen (TYLENOL) 650 MG CR tablet Take 650 mg by mouth every 3 (three) hours.     Marland Kitchen amiodarone (PACERONE) 200 MG tablet TAKE 1 TABLET BY MOUTH  DAILY 90 tablet 2  . diltiazem (CARDIZEM CD) 360 MG 24 hr capsule TAKE 1 CAPSULE BY MOUTH  DAILY 90 capsule 3  . ELIQUIS 5 MG TABS tablet TAKE 1 TABLET BY MOUTH TWO  TIMES DAILY 180 tablet 3  . ferrous sulfate 325 (65 FE) MG tablet Take 325 mg by mouth daily with breakfast.    . fluticasone (FLONASE) 50 MCG/ACT nasal spray Place 1 spray into both nostrils at bedtime.     . hydrochlorothiazide (MICROZIDE) 12.5 MG capsule Take 1 tablet by mouth Monday-Friday only (Patient taking differently: Take 1 tablet by mouth daily Monday-Friday only) 90 capsule 3  . lansoprazole (PREVACID) 30 MG capsule Take 1 capsule (30 mg total) by mouth 2 (two) times daily before a meal. 180 capsule 2  . levothyroxine (SYNTHROID, LEVOTHROID) 175 MCG tablet Take 175 mcg by mouth daily before breakfast.    . LORazepam (ATIVAN) 1 MG tablet Take 1.5 mg by mouth at bedtime.    Marland Kitchen losartan (COZAAR) 25 MG tablet Take 1 tablet (25 mg total) by mouth daily. 90 tablet 3  . Lutein 6 MG CAPS Take 6 mg by mouth daily.     . metFORMIN (GLUMETZA) 500 MG (MOD) 24 hr tablet Take 500 mg by mouth 2 (two) times daily with a meal.     . Multiple Vitamin (MULTIVITAMIN WITH MINERALS) TABS tablet Take 1 tablet by mouth daily.    . Multiple Vitamins-Minerals (ICAPS AREDS 2) CAPS Take 1 capsule by mouth daily.    . ONE TOUCH ULTRA TEST test strip 1 each by Other route 3 (three) times daily. Use as directed    . ONETOUCH DELICA LANCETS 34V MISC 1  each by Other route 3 (three) times daily. Use as directed    . Polyvinyl Alcohol-Povidone PF (REFRESH) 1.4-0.6 % SOLN Place 1 drop into both eyes at bedtime.    . potassium chloride (KLOR-CON) 20 MEQ packet Take 20 mEq by mouth daily.     . rosuvastatin (CRESTOR) 10 MG tablet Take 10 mg by mouth at bedtime.    . sertraline (ZOLOFT)  50 MG tablet Take 50 mg by mouth daily.    . vitamin B-12 (CYANOCOBALAMIN) 500 MCG tablet Take 500 mcg by mouth daily as needed (energy).    . metoprolol tartrate (LOPRESSOR) 25 MG tablet Take 0.5 tablets (12.5 mg total) 2 (two) times daily by mouth. 30 tablet 3   No current facility-administered medications for this encounter.     Allergies  Allergen Reactions  . Penicillins Itching and Rash    Has patient had a PCN reaction causing immediate rash, facial/tongue/throat swelling, SOB or lightheadedness with hypotension: Unknown, childhood reaction Has patient had a PCN reaction causing severe rash involving mucus membranes or skin necrosis: No Has patient had a PCN reaction that required hospitalization No Has patient had a PCN reaction occurring within the last 10 years: No If all of the above answers are "NO", then may proceed with Cephalosporin use.     Social History   Socioeconomic History  . Marital status: Divorced    Spouse name: Not on file  . Number of children: 2  . Years of education: Not on file  . Highest education level: Not on file  Social Needs  . Financial resource strain: Not on file  . Food insecurity - worry: Not on file  . Food insecurity - inability: Not on file  . Transportation needs - medical: Not on file  . Transportation needs - non-medical: Not on file  Occupational History  . Occupation: Retired  Tobacco Use  . Smoking status: Never Smoker  . Smokeless tobacco: Never Used  Substance and Sexual Activity  . Alcohol use: No    Alcohol/week: 0.0 oz  . Drug use: No  . Sexual activity: No  Other Topics Concern  . Not  on file  Social History Narrative  . Not on file    Family History  Problem Relation Age of Onset  . Prostate cancer Father   . Diabetes Sister   . Heart disease Sister   . Heart attack Brother   . Heart disease Brother   . Diabetes Brother   . Diabetes Sister   . Diabetes Sister   . Pancreatic cancer Sister   . Diabetes Brother   . Heart disease Brother   . Diabetes Brother   . Heart disease Brother   . Diabetes Brother   . Heart disease Brother   . Diabetes Brother   . Heart disease Brother   . Diabetes Brother   . Heart disease Brother   . Diabetes Brother     ROS- All systems are reviewed and negative except as per the HPI above  Physical Exam: Vitals:   09/24/17 1520  BP: 126/72  Pulse: (!) 111  Weight: 178 lb 12.8 oz (81.1 kg)  Height: 5\' 6"  (1.676 m)   Wt Readings from Last 3 Encounters:  09/24/17 178 lb 12.8 oz (81.1 kg)  09/11/17 179 lb 6.4 oz (81.4 kg)  09/05/17 178 lb (80.7 kg)    Labs: Lab Results  Component Value Date   NA 141 09/05/2017   K 4.3 09/05/2017   CL 101 09/05/2017   CO2 24 09/05/2017   GLUCOSE 113 (H) 09/05/2017   BUN 18 09/05/2017   CREATININE 0.81 09/05/2017   CALCIUM 10.0 09/05/2017   MG 2.2 06/07/2017   Lab Results  Component Value Date   INR 1.12 04/20/2016   No results found for: CHOL, HDL, LDLCALC, TRIG   GEN- The patient is well appearing, alert and oriented x 3 today.  Head- normocephalic, atraumatic Eyes-  Sclera clear, conjunctiva pink Ears- hearing intact Oropharynx- clear Neck- supple, no JVP Lymph- no cervical lymphadenopathy Lungs- Clear to ausculation bilaterally, normal work of breathing Heart- irregular rate and rhythm, no murmurs, rubs or gallops, PMI not laterally displaced GI- soft, NT, ND, + BS Extremities- no clubbing, cyanosis, or edema MS- no significant deformity or atrophy Skin- no rash or lesion Psych- euthymic mood, full affect Neuro- strength and sensation are intact  EKG- afib at  111 bpm Epic records reviewed   Labs- 10/24-TSH/liver recently checked and WNL, CBC wnl, BMET with glucose of 113 otherwise unremarkable   Assessment and Plan: 1. Persistent afib Successful cardioversion but with ERAF  Has resumed amiodarone  200 mg a day Discussed options, letting amiodarone wash out and trying tikosyn, AV nodal ablation with PPM or live in afib, as she does not appear to very asymptomatic  She really is wanting to wait a few weeks and then try cardioversion again, as none of the options sound appealing to her I will add metoprolol tartrate 25 mg 1/2 tab bid for additional rate control Continue cardizem at 360 mg a day Continue eliquis 5 mg bid for chadsvasc score of at least 5, states no missed doses   2. Persistent cough Improved with HH repair  Will f/u in afib clinic after several weeks to further discuss pursing cardioversion. F/u with pulmonology 12/17, [name] 12/6   Geroge Baseman. [place], [facility] [address] [place], [place] [address] [phone]

## 2017-09-24 NOTE — Patient Instructions (Signed)
1)Metoprolol 1/2 tablet twice a day

## 2017-09-25 ENCOUNTER — Ambulatory Visit: Payer: Medicare Other | Admitting: Pulmonary Disease

## 2017-09-26 ENCOUNTER — Ambulatory Visit (HOSPITAL_COMMUNITY): Payer: Medicare Other | Admitting: Nurse Practitioner

## 2017-10-08 ENCOUNTER — Ambulatory Visit (HOSPITAL_COMMUNITY)
Admission: RE | Admit: 2017-10-08 | Discharge: 2017-10-08 | Disposition: A | Discharge status: Home / Self Care | Payer: Medicare Other | Source: Ambulatory Visit | Attending: Nurse Practitioner | Admitting: Nurse Practitioner

## 2017-10-08 ENCOUNTER — Encounter (HOSPITAL_COMMUNITY): Payer: Self-pay | Admitting: Nurse Practitioner

## 2017-10-08 VITALS — BP 98/58 | HR 81 | Ht 66.0 in | Wt 178.0 lb

## 2017-10-08 DIAGNOSIS — Z8249 Family history of ischemic heart disease and other diseases of the circulatory system: Secondary | ICD-10-CM | POA: Insufficient documentation

## 2017-10-08 DIAGNOSIS — Z7901 Long term (current) use of anticoagulants: Secondary | ICD-10-CM | POA: Insufficient documentation

## 2017-10-08 DIAGNOSIS — Z833 Family history of diabetes mellitus: Secondary | ICD-10-CM | POA: Diagnosis not present

## 2017-10-08 DIAGNOSIS — Z90710 Acquired absence of both cervix and uterus: Secondary | ICD-10-CM | POA: Diagnosis not present

## 2017-10-08 DIAGNOSIS — Z79899 Other long term (current) drug therapy: Secondary | ICD-10-CM | POA: Diagnosis not present

## 2017-10-08 DIAGNOSIS — Z8541 Personal history of malignant neoplasm of cervix uteri: Secondary | ICD-10-CM | POA: Diagnosis not present

## 2017-10-08 DIAGNOSIS — I10 Essential (primary) hypertension: Secondary | ICD-10-CM | POA: Insufficient documentation

## 2017-10-08 DIAGNOSIS — Z9889 Other specified postprocedural states: Secondary | ICD-10-CM | POA: Insufficient documentation

## 2017-10-08 DIAGNOSIS — I4819 Other persistent atrial fibrillation: Secondary | ICD-10-CM

## 2017-10-08 DIAGNOSIS — I481 Persistent atrial fibrillation: Secondary | ICD-10-CM | POA: Diagnosis present

## 2017-10-08 DIAGNOSIS — Z853 Personal history of malignant neoplasm of breast: Secondary | ICD-10-CM | POA: Insufficient documentation

## 2017-10-08 DIAGNOSIS — K2270 Barrett's esophagus without dysplasia: Secondary | ICD-10-CM | POA: Insufficient documentation

## 2017-10-08 DIAGNOSIS — Z85850 Personal history of malignant neoplasm of thyroid: Secondary | ICD-10-CM | POA: Diagnosis not present

## 2017-10-08 DIAGNOSIS — E119 Type 2 diabetes mellitus without complications: Secondary | ICD-10-CM | POA: Diagnosis not present

## 2017-10-08 DIAGNOSIS — Z7984 Long term (current) use of oral hypoglycemic drugs: Secondary | ICD-10-CM | POA: Diagnosis not present

## 2017-10-08 DIAGNOSIS — G4733 Obstructive sleep apnea (adult) (pediatric): Secondary | ICD-10-CM | POA: Insufficient documentation

## 2017-10-08 NOTE — Addendum Note (Signed)
Encounter addended by: Sherran Needs, NP on: 10/08/2017 4:14 PM  Actions taken: Sign clinical note

## 2017-10-08 NOTE — Progress Notes (Signed)
Primary Care Physician: Lavone Orn, MD Cardiologist: [name] Referral provider: Truitt Merle, NP Pulmonologist: Dr. Elsworth Soho GI: Dr. Dolphus Jenny [name] is a 83 y.o. female with a h/o afib that has been on amiodarone since June of 2015. She had afib 12/2016, in the setting of persistent cough x 6-8 weeks  that had not responded to two rounds of prednisone and antibiotics. She had also been using cough syrup with decongestants. Her Cardizem was increased and  BB was added with good slowing of v rate and with slower heart rate, she does feel better but persists in afib. She is not really wanting a cardioversion. Her biggest concern was  cough. She did have a CXR and other than a large hiatal hernia, there were no other abnormalities noted. TSH had also been mildly elevated but PCP aware and was monitoring. .Continues on eliquis. She did have repair of Hiatal hernia 05/25/17.  She is in the  afib clinic for return to afib 7-10 days ago. She had been trying to wean off amiodarone with concerns re her lungs and her eye doctor could see evidence of amio use, but she states that her vision had not changed. CT chest in March did not show any evidence of lung toxicity for amiodarone. She is pending another eval with lung doctor 11/13.  She increased her amio back to 200 mg a day about 10 days ago.  We discussed options to amiodarone, with either Tikosyn or sotalol and would have to let amio wash out and may take 2-3 months before eligible for the other drugs. She does not think she can tolerate afib for that period of time. We also discussed possible av nodal ablation and PPM but she does not like this option as well. For now she is happy to use the amiodarone. No missed doses of eliquis.She is tolerating the afib.  F/u in the afib clinic 11/12. She had successful cardioversion 11/7, but went back into afib this weekend. She  states that it is because her grandson came by 3 different times recently  asking for money and he stressed her out too much, causing her to go back into afib.. She tells me in the past, another time she went into afib  because a daughter asked her for $7000. She does not feel that different in afib, noticed it because of increase in her HR to around 100. She was suppose to f/u with Pulmonologist tomorrow, but moved appointment into December as she felt she would not be able to do PFT's in afib.  F/u in the afib clinic, 11/26, for adjustment in rate control with metoprolol 12.5 mg bid on last visit. She is better rate controlled in the 80's, BP on softer side and will stop hctz. She is saying she would like to try cardioversion one more time. Natasha questioned her closely to see how symptomatic she really is in afib and the only way she knows she is in afib is because of her pulse ox. She does not seem to be functionally compromised with afib. We discussed just staying in afib as an option. Will update echo to make sure EF has not declined.  Today, she denies symptoms of chest pain, shortness of breath, orthopnea, PND, lower extremity edema, dizziness, presyncope, syncope, or neurologic sequela.  The patient is tolerating medications without difficulties and is otherwise without complaint today.   Past Medical History:  Diagnosis Date  . ADENOCARCINOMA, BREAST 10/13/2010   Qualifier: Diagnosis  of  By: Nils Pyle CMA ([place]), Mearl Latin    . Allergy    SEASONAL  . Anemia    years ago  . Arthritis    "all my joints" (03/31/2014)  . Atrial fibrillation ([place])   . Atrial fibrillation with RVR ([place]) 03/31/2015   DLCO dropped from 100% in 2015-50% in 01/2017  . Barrett's esophagus   . Breast cancer [facility])    s/p right breast lumpectomy  . Cervical cancer ([place])   . DJD (degenerative joint disease) of knee   . Dysrhythmia    Afib  . GERD (gastroesophageal reflux disease)   . Glaucoma   . H/O hiatal hernia   . Heart murmur   . Hx of transesophageal echocardiography (TEE) for monitoring      TEE (03/2014): No LAA clot, moderate LAE, core triatriatum type structure in RA with no stenosis, normal EF 55-60%, mild MR  . Hypertension   . Hypothyroidism, postsurgical   . Migraine    "last one was years ago" (03/31/2014)  . OSA on CPAP    settings at 10-11   . RECTAL FISSURE 10/13/2010   Qualifier: Diagnosis of  By: Nils Pyle CMA ([place]), Mearl Latin    . Sleep apnea   . Thyroid cancer [facility])    s/p thyroidectomy  . Type II diabetes mellitus ([place])    type II    Past Surgical History:  Procedure Laterality Date  . ABDOMINAL HYSTERECTOMY     "partial"  . BREAST BIOPSY Right   . BREAST LUMPECTOMY Right   . CARDIOVERSION N/A 03/30/2014   Procedure: CARDIOVERSION - BEDSIDE;  Surgeon: [name], MD;  Location: [place];  Service: Cardiovascular;  Laterality: N/A;  . CARDIOVERSION N/A 03/31/2014   Procedure: CARDIOVERSION  (BEDSIDE) ;  Surgeon: Lelon Perla, MD;  Location: [place];  Service: Cardiovascular;  Laterality: N/A;  . CARDIOVERSION N/A 04/27/2014   Procedure: CARDIOVERSION;  Surgeon: Darlin Coco, MD;  Location: [facility] ENDOSCOPY;  Service: Cardiovascular;  Laterality: N/A;  . CARDIOVERSION N/A 04/13/2015   Procedure: CARDIOVERSION;  Surgeon: Thayer Headings, MD;  Location: [place];  Service: Cardiovascular;  Laterality: N/A;  . CARDIOVERSION N/A 01/08/2017   Procedure: CARDIOVERSION;  Surgeon: Sanda Klein, MD;  Location: [facility] ENDOSCOPY;  Service: Cardiovascular;  Laterality: N/A;  . CARDIOVERSION N/A 09/19/2017   Procedure: CARDIOVERSION;  Surgeon: [name], MD;  Location: [place];  Service: Cardiovascular;  Laterality: N/A;  . CARPAL TUNNEL RELEASE Right   . CATARACT EXTRACTION W/ INTRAOCULAR LENS  IMPLANT, BILATERAL Bilateral   . COLONOSCOPY    . EXCISIONAL HEMORRHOIDECTOMY    . INSERTION OF MESH N/A 05/25/2017   Procedure: INSERTION OF MESH;  Surgeon: Ralene Ok, MD;  Location: [facility] ORS;  Service: General;  Laterality: N/A;  . TEE WITHOUT CARDIOVERSION N/A  03/30/2014   Procedure: TRANSESOPHAGEAL ECHOCARDIOGRAM (TEE);  Surgeon: [name], MD;  Location: [facility] ENDOSCOPY;  Service: Cardiovascular;  Laterality: N/A;  . THYROIDECTOMY    . TONSILLECTOMY      Current Outpatient Medications  Medication Sig Dispense Refill  . acetaminophen (TYLENOL) 650 MG CR tablet Take 650 mg by mouth every 3 (three) hours.     Marland Kitchen amiodarone (PACERONE) 200 MG tablet TAKE 1 TABLET BY MOUTH  DAILY 90 tablet 2  . diltiazem (CARDIZEM CD) 360 MG 24 hr capsule TAKE 1 CAPSULE BY MOUTH  DAILY 90 capsule 3  . ELIQUIS 5 MG TABS tablet TAKE 1 TABLET BY MOUTH TWO  TIMES DAILY 180 tablet  3  . ferrous sulfate 325 (65 FE) MG tablet Take 325 mg by mouth daily with breakfast.    . fluticasone (FLONASE) 50 MCG/ACT nasal spray Place 1 spray into both nostrils at bedtime.     . lansoprazole (PREVACID) 30 MG capsule Take 1 capsule (30 mg total) by mouth 2 (two) times daily before a meal. 180 capsule 2  . levothyroxine (SYNTHROID, LEVOTHROID) 175 MCG tablet Take 175 mcg by mouth daily before breakfast.    . LORazepam (ATIVAN) 1 MG tablet Take 1.5 mg by mouth at bedtime.    Marland Kitchen losartan (COZAAR) 25 MG tablet Take 1 tablet (25 mg total) by mouth daily. 90 tablet 3  . Lutein 6 MG CAPS Take 6 mg by mouth daily.     . metFORMIN (GLUMETZA) 500 MG (MOD) 24 hr tablet Take 500 mg by mouth 2 (two) times daily with a meal.     . metoprolol tartrate (LOPRESSOR) 25 MG tablet Take 0.5 tablets (12.5 mg total) 2 (two) times daily by mouth. 30 tablet 3  . Multiple Vitamin (MULTIVITAMIN WITH MINERALS) TABS tablet Take 1 tablet by mouth daily.    . Multiple Vitamins-Minerals (ICAPS AREDS 2) CAPS Take 1 capsule by mouth daily.    . ONE TOUCH ULTRA TEST test strip 1 each by Other route 3 (three) times daily. Use as directed    . ONETOUCH DELICA LANCETS 35T MISC 1 each by Other route 3 (three) times daily. Use as directed    . Polyvinyl Alcohol-Povidone PF (REFRESH) 1.4-0.6 % SOLN Place 1 drop into both eyes at  bedtime.    . potassium chloride (KLOR-CON) 20 MEQ packet Take 20 mEq by mouth daily.     . rosuvastatin (CRESTOR) 10 MG tablet Take 10 mg by mouth at bedtime.    . sertraline (ZOLOFT) 50 MG tablet Take 50 mg by mouth daily.    . vitamin B-12 (CYANOCOBALAMIN) 500 MCG tablet Take 500 mcg by mouth daily as needed (energy).     No current facility-administered medications for this encounter.     Allergies  Allergen Reactions  . Penicillins Itching and Rash    Has patient had a PCN reaction causing immediate rash, facial/tongue/throat swelling, SOB or lightheadedness with hypotension: Unknown, childhood reaction Has patient had a PCN reaction causing severe rash involving mucus membranes or skin necrosis: No Has patient had a PCN reaction that required hospitalization No Has patient had a PCN reaction occurring within the last 10 years: No If all of the above answers are "NO", then may proceed with Cephalosporin use.     Social History   Socioeconomic History  . Marital status: Divorced    Spouse name: Not on file  . Number of children: 2  . Years of education: Not on file  . Highest education level: Not on file  Social Needs  . Financial resource strain: Not on file  . Food insecurity - worry: Not on file  . Food insecurity - inability: Not on file  . Transportation needs - medical: Not on file  . Transportation needs - non-medical: Not on file  Occupational History  . Occupation: Retired  Tobacco Use  . Smoking status: Never Smoker  . Smokeless tobacco: Never Used  Substance and Sexual Activity  . Alcohol use: No    Alcohol/week: 0.0 oz  . Drug use: No  . Sexual activity: No  Other Topics Concern  . Not on file  Social History Narrative  . Not on file  Family History  Problem Relation Age of Onset  . Prostate cancer Father   . Diabetes Sister   . Heart disease Sister   . Heart attack Brother   . Heart disease Brother   . Diabetes Brother   . Diabetes Sister     . Diabetes Sister   . Pancreatic cancer Sister   . Diabetes Brother   . Heart disease Brother   . Diabetes Brother   . Heart disease Brother   . Diabetes Brother   . Heart disease Brother   . Diabetes Brother   . Heart disease Brother   . Diabetes Brother   . Heart disease Brother   . Diabetes Brother     ROS- All systems are reviewed and negative except as per the HPI above  Physical Exam: Vitals:   10/08/17 1504  BP: (!) 98/58  Pulse: 81  Weight: 178 lb (80.7 kg)  Height: 5\' 6"  (1.676 m)   Wt Readings from Last 3 Encounters:  10/08/17 178 lb (80.7 kg)  09/24/17 178 lb 12.8 oz (81.1 kg)  09/11/17 179 lb 6.4 oz (81.4 kg)    Labs: Lab Results  Component Value Date   NA 141 09/05/2017   K 4.3 09/05/2017   CL 101 09/05/2017   CO2 24 09/05/2017   GLUCOSE 113 (H) 09/05/2017   BUN 18 09/05/2017   CREATININE 0.81 09/05/2017   CALCIUM 10.0 09/05/2017   MG 2.2 06/07/2017   Lab Results  Component Value Date   INR 1.12 04/20/2016   No results found for: CHOL, HDL, LDLCALC, TRIG   GEN- The patient is well appearing, alert and oriented x 3 today.   Head- normocephalic, atraumatic Eyes-  Sclera clear, conjunctiva pink Ears- hearing intact Oropharynx- clear Neck- supple, no JVP Lymph- no cervical lymphadenopathy Lungs- Clear to ausculation bilaterally, normal work of breathing Heart- irregular rate and rhythm, no murmurs, rubs or gallops, PMI not laterally displaced GI- soft, NT, ND, + BS Extremities- no clubbing, cyanosis, or edema MS- no significant deformity or atrophy Skin- no rash or lesion Psych- euthymic mood, full affect Neuro- strength and sensation are intact  EKG- afib at 81 bpm, qrs int 98 ms, qtc 478 ms Epic records reviewed   Labs- 10/24-TSH/liver recently checked and WNL, CBC wnl, BMET with glucose of 113 otherwise unremarkable   Assessment and Plan: 1. Persistent afib Successful cardioversion but with ERAF  Has resumed amiodarone  200 mg  a day Discussed options, letting amiodarone wash out and trying tikosyn, may take months, AV nodal ablation with PPM or stop amiodarone and live in afib, as she does not appear to very symptomatic  Will update echo to see if EF has declined or left atrium has increased in size which would undermine ability to restore and maintain SR Continue metoprolol tartrate 25 mg 1/2 tab bid for additional rate control Continue cardizem at 360 mg a day Stop hctz as BP softer with BB Continue eliquis 5 mg bid for chadsvasc score of at least 5, states no missed doses  She would really like to discuss with [name] on her next visit 12/6, will have echo updated by then for review   Will be glad to assist in any way needed F/u with pulmonology 12/17    Butch Penny C. Eligio Angert, [facility] [address] [place], [place] [address] [phone]

## 2017-10-08 NOTE — Patient Instructions (Signed)
STOP HCTZ

## 2017-10-11 ENCOUNTER — Ambulatory Visit (HOSPITAL_COMMUNITY)
Admission: RE | Admit: 2017-10-11 | Discharge: 2017-10-11 | Disposition: A | Discharge status: Home / Self Care | Payer: Medicare Other | Source: Ambulatory Visit | Attending: Nurse Practitioner | Admitting: Nurse Practitioner

## 2017-10-11 DIAGNOSIS — I10 Essential (primary) hypertension: Secondary | ICD-10-CM | POA: Diagnosis not present

## 2017-10-11 DIAGNOSIS — G4730 Sleep apnea, unspecified: Secondary | ICD-10-CM | POA: Diagnosis not present

## 2017-10-11 DIAGNOSIS — Z853 Personal history of malignant neoplasm of breast: Secondary | ICD-10-CM | POA: Diagnosis not present

## 2017-10-11 DIAGNOSIS — I4891 Unspecified atrial fibrillation: Secondary | ICD-10-CM | POA: Diagnosis not present

## 2017-10-11 DIAGNOSIS — E119 Type 2 diabetes mellitus without complications: Secondary | ICD-10-CM | POA: Diagnosis not present

## 2017-10-11 DIAGNOSIS — I481 Persistent atrial fibrillation: Secondary | ICD-10-CM

## 2017-10-11 DIAGNOSIS — I4819 Other persistent atrial fibrillation: Secondary | ICD-10-CM

## 2017-10-11 DIAGNOSIS — R011 Cardiac murmur, unspecified: Secondary | ICD-10-CM | POA: Insufficient documentation

## 2017-10-11 DIAGNOSIS — K219 Gastro-esophageal reflux disease without esophagitis: Secondary | ICD-10-CM | POA: Insufficient documentation

## 2017-10-11 DIAGNOSIS — I081 Rheumatic disorders of both mitral and tricuspid valves: Secondary | ICD-10-CM | POA: Insufficient documentation

## 2017-10-11 NOTE — Progress Notes (Signed)
  Echocardiogram 2D Echocardiogram has been performed.  [name] M 10/11/2017, 10:26 AM

## 2017-10-15 ENCOUNTER — Ambulatory Visit (HOSPITAL_COMMUNITY): Payer: Medicare Other | Admitting: Nurse Practitioner

## 2017-10-18 ENCOUNTER — Ambulatory Visit: Payer: Medicare Other | Admitting: Cardiovascular Disease

## 2017-10-18 ENCOUNTER — Encounter: Payer: Self-pay | Admitting: Cardiovascular Disease

## 2017-10-18 VITALS — BP 120/60 | HR 86 | Ht 66.0 in | Wt 176.8 lb

## 2017-10-18 DIAGNOSIS — I10 Essential (primary) hypertension: Secondary | ICD-10-CM | POA: Diagnosis not present

## 2017-10-18 DIAGNOSIS — I481 Persistent atrial fibrillation: Secondary | ICD-10-CM | POA: Diagnosis not present

## 2017-10-18 DIAGNOSIS — I4819 Other persistent atrial fibrillation: Secondary | ICD-10-CM

## 2017-10-18 MED ORDER — METOPROLOL TARTRATE 25 MG PO TABS: 25.0000 mg | ORAL_TABLET | Freq: Two times a day (BID) | ORAL | 3 refills | Status: DC

## 2017-10-18 NOTE — H&P (View-Only) (Signed)
Cardiology Office Note Date:  10/20/2017   ID:  [name], DOB [birth year], MRN [id number]  PCP:  Lavone Orn, MD  Cardiologist:  Sherren Mocha, MD    Chief Complaint  Patient presents with  . Atrial Fibrillation     History of Present Illness: [name] is a 83 y.o. female who presents for follow-up of persistent atrial fibrillation.   The patient has had multiple cardioversions over the years.  She is been treated with amiodarone since 2015.  She also takes diltiazem and low-dose of metoprolol.  She is developed concerns about reduced DLCO and the effects of amiodarone on her eyes.  Her ophthalmologist saw some changes on her retinal exam but she has not had any symptomatic visual problems.  She is anticoagulated with apixaban.  Recently she has been followed closely in the atrial fib clinic.  She has undergone cardioversion but return to atrial fibrillation a few days later.  Her amnio had been reduced to 100 mg daily but now has been back up to 200 mg daily and she is tolerating this fine.  She notes that she was under a lot of family stress at the time of her last cardioversion and she comes in today to discuss further options.  The patient is here with her son today.  She complains of weakness, fatigue, shortness of breath, and balance problems.  She denies chest pain or pressure.  She does not have heart palpitations.  She denies edema in her legs.  She has had no recent falls.    Past Medical History:  Diagnosis Date  . ADENOCARCINOMA, BREAST 10/13/2010   Qualifier: Diagnosis of  By: Nils Pyle CMA (AAMA), Mearl Latin    . Allergy    SEASONAL  . Anemia    years ago  . Arthritis    "all my joints" (03/31/2014)  . Atrial fibrillation ([place])   . Atrial fibrillation with RVR ([place]) 03/31/2015   DLCO dropped from 100% in 2015-50% in 01/2017  . Barrett's esophagus   . Breast cancer [facility])    s/p right breast lumpectomy  . Cervical cancer ([place])   . DJD (degenerative joint  disease) of knee   . Dysrhythmia    Afib  . GERD (gastroesophageal reflux disease)   . Glaucoma   . H/O hiatal hernia   . Heart murmur   . Hx of transesophageal echocardiography (TEE) for monitoring    TEE (03/2014): No LAA clot, moderate LAE, core triatriatum type structure in RA with no stenosis, normal EF 55-60%, mild MR  . Hypertension   . Hypothyroidism, postsurgical   . Migraine    "last one was years ago" (03/31/2014)  . OSA on CPAP    settings at 10-11   . RECTAL FISSURE 10/13/2010   Qualifier: Diagnosis of  By: Nils Pyle CMA ([place]), Mearl Latin    . Sleep apnea   . Thyroid cancer [facility])    s/p thyroidectomy  . Type II diabetes mellitus ([place])    type II     Past Surgical History:  Procedure Laterality Date  . ABDOMINAL HYSTERECTOMY     "partial"  . BREAST BIOPSY Right   . BREAST LUMPECTOMY Right   . CARDIOVERSION N/A 03/30/2014   Procedure: CARDIOVERSION - BEDSIDE;  Surgeon: [name], MD;  Location: [place];  Service: Cardiovascular;  Laterality: N/A;  . CARDIOVERSION N/A 03/31/2014   Procedure: CARDIOVERSION  (BEDSIDE) ;  Surgeon: Lelon Perla, MD;  Location: [place];  Service: Cardiovascular;  Laterality: N/A;  . CARDIOVERSION N/A 04/27/2014   Procedure: CARDIOVERSION;  Surgeon: Darlin Coco, MD;  Location: [facility] ENDOSCOPY;  Service: Cardiovascular;  Laterality: N/A;  . CARDIOVERSION N/A 04/13/2015   Procedure: CARDIOVERSION;  Surgeon: Thayer Headings, MD;  Location: [place];  Service: Cardiovascular;  Laterality: N/A;  . CARDIOVERSION N/A 01/08/2017   Procedure: CARDIOVERSION;  Surgeon: Sanda Klein, MD;  Location: [facility] ENDOSCOPY;  Service: Cardiovascular;  Laterality: N/A;  . CARDIOVERSION N/A 09/19/2017   Procedure: CARDIOVERSION;  Surgeon: [name], MD;  Location: [place];  Service: Cardiovascular;  Laterality: N/A;  . CARPAL TUNNEL RELEASE Right   . CATARACT EXTRACTION W/ INTRAOCULAR LENS  IMPLANT, BILATERAL Bilateral   . COLONOSCOPY    . EXCISIONAL  HEMORRHOIDECTOMY    . INSERTION OF MESH N/A 05/25/2017   Procedure: INSERTION OF MESH;  Surgeon: Ralene Ok, MD;  Location: [facility];  Service: General;  Laterality: N/A;  . TEE WITHOUT CARDIOVERSION N/A 03/30/2014   Procedure: TRANSESOPHAGEAL ECHOCARDIOGRAM (TEE);  Surgeon: [name], MD;  Location: [facility] ENDOSCOPY;  Service: Cardiovascular;  Laterality: N/A;  . THYROIDECTOMY    . TONSILLECTOMY      Current Outpatient Medications  Medication Sig Dispense Refill  . acetaminophen (TYLENOL) 650 MG CR tablet Take 650 mg by mouth every 3 (three) hours.     Marland Kitchen amiodarone (PACERONE) 200 MG tablet TAKE 1 TABLET BY MOUTH  DAILY 90 tablet 2  . diltiazem (CARDIZEM CD) 360 MG 24 hr capsule TAKE 1 CAPSULE BY MOUTH  DAILY 90 capsule 3  . ELIQUIS 5 MG TABS tablet TAKE 1 TABLET BY MOUTH TWO  TIMES DAILY 180 tablet 3  . ferrous sulfate 325 (65 FE) MG tablet Take 325 mg by mouth daily with breakfast.    . fluticasone (FLONASE) 50 MCG/ACT nasal spray Place 1 spray into both nostrils at bedtime.     . lansoprazole (PREVACID) 30 MG capsule Take 1 capsule (30 mg total) by mouth 2 (two) times daily before a meal. 180 capsule 2  . levothyroxine (SYNTHROID, LEVOTHROID) 175 MCG tablet Take 175 mcg by mouth daily before breakfast.    . LORazepam (ATIVAN) 1 MG tablet Take 1.5 mg by mouth at bedtime.    Marland Kitchen losartan (COZAAR) 25 MG tablet Take 1 tablet (25 mg total) by mouth daily. 90 tablet 3  . Lutein 6 MG CAPS Take 6 mg by mouth daily.     . metFORMIN (GLUMETZA) 500 MG (MOD) 24 hr tablet Take 500 mg by mouth 2 (two) times daily with a meal.     . metoprolol tartrate (LOPRESSOR) 25 MG tablet Take 1 tablet (25 mg total) by mouth 2 (two) times daily. 180 tablet 3  . Multiple Vitamin (MULTIVITAMIN WITH MINERALS) TABS tablet Take 1 tablet by mouth daily.    . Multiple Vitamins-Minerals (ICAPS AREDS 2) CAPS Take 1 capsule by mouth daily.    . ONE TOUCH ULTRA TEST test strip 1 each by Other route 3 (three) times daily.  Use as directed    . ONETOUCH DELICA LANCETS 29H MISC 1 each by Other route 3 (three) times daily. Use as directed    . Polyvinyl Alcohol-Povidone PF (REFRESH) 1.4-0.6 % SOLN Place 1 drop into both eyes at bedtime.    . potassium chloride (KLOR-CON) 20 MEQ packet Take 20 mEq by mouth daily.     . rosuvastatin (CRESTOR) 10 MG tablet Take 10 mg by mouth at bedtime.    . sertraline (ZOLOFT) 50 MG tablet  Take 50 mg by mouth daily.    . vitamin B-12 (CYANOCOBALAMIN) 500 MCG tablet Take 500 mcg by mouth daily as needed (energy).     No current facility-administered medications for this visit.     Allergies:   Penicillins   Social History:  The patient  reports that  has never smoked. she has never used smokeless tobacco. She reports that she does not drink alcohol or use drugs.   Family History:  The patient's family history includes Diabetes in her brother, brother, brother, brother, brother, brother, brother, sister, sister, and sister; Heart attack in her brother; Heart disease in her brother, brother, brother, brother, brother, brother, and sister; Pancreatic cancer in her sister; Prostate cancer in her father.    ROS:  Please see the history of present illness.  Otherwise, review of systems is positive for cough.  All other systems are reviewed and negative.    PHYSICAL EXAM: VS:  BP 120/60   Pulse 86   Ht 5\' 6"  (1.676 m)   Wt 176 lb 12.8 oz (80.2 kg)   LMP  (LMP Unknown)   SpO2 97%   BMI 28.54 kg/m  , BMI Body mass index is 28.54 kg/m. GEN: Well nourished, well developed, pleasant elderly woman in no acute distress  HEENT: normal  Neck: no JVD, no masses. No carotid bruits Cardiac: irregularly irregular without murmur or gallop       Respiratory:  clear to auscultation bilaterally, normal work of breathing GI: soft, nontender, nondistended, + BS MS: no deformity or atrophy  Ext: no pretibial edema, pedal pulses 2+= bilaterally Skin: warm and dry, no rash Neuro:  Strength and  sensation are intact Psych: euthymic mood, full affect  EKG:  EKG is not ordered today.  Recent Labs: 06/07/2017: Magnesium 2.2 09/05/2017: ALT 15; TSH 2.630 10/18/2017: BUN 19; Creatinine, Ser 0.88; Hemoglobin 13.9; Platelets 186; Potassium 4.6; Sodium 143   Lipid Panel  No results found for: CHOL, TRIG, HDL, CHOLHDL, VLDL, LDLCALC, LDLDIRECT    Wt Readings from Last 3 Encounters:  10/18/17 176 lb 12.8 oz (80.2 kg)  10/08/17 178 lb (80.7 kg)  09/24/17 178 lb 12.8 oz (81.1 kg)     Cardiac Studies Reviewed: Echo 10-11-2017: Study Conclusions  - Left ventricle: Wall thickness was increased in a pattern of  moderate LVH. There was severe focal basal hypertrophy of the  septum - measuring 2.0 cm, consistent with possible hypertrophic  cardimyopathy. Systolic function was mildly to moderately  reduced. The estimated ejection fraction was in the range of 40%  to 45%. Diffuse hypokinesis. The study is not technically  sufficient to allow evaluation of LV diastolic function. LV  filling pressure appreas elevated. Ejection fraction (MOD,  2-plane): 41%.  - Aortic valve: Trileaflet. Sclerosis without stenosis. There was  no regurgitation.  - Mitral valve: Calcified annulus. Mildly thickened leaflets .  There was mild regurgitation. Valve area by continuity equation  (using LVOT flow): 1.77 cm^2.  - Left atrium: Severely dilated.  - Right ventricle: The cavity size was mildly dilated. Mildly  reduced systolic function.  - Tricuspid valve: There was moderate regurgitation.  - Pulmonary arteries: PA peak pressure: 33 mm Hg (S).  - Inferior vena cava: The vessel was normal in size. The  respirophasic diameter changes were in the normal range (>= 50%),  consistent with normal central venous pressure.  Impressions:  - Compared to a prior echo in 2016, a-fib is present. The LVEF is  lower at 40-45% with  global hypokinesis, there is moderate LVH  with severe proximal septal thickening -  possibly suggesting  hypertrophic cardiomyopathy, LV filling pressure is elevated,  there is mild MR, severe LAE, mild RVE with reduced RV systolic  function, moderate TR, RVSP of 33 mmHg, with a normal IVC.   ASSESSMENT AND PLAN: Persistent atrial fibrillation: The patient brings in heart rate readings today and they are improved.  She remains in atrial fibrillation with symptoms that may be associated with atrial fibrillation but somewhat unclear.  Her LV function is mildly reduced now with global hypokinesis of the left ventricle with an LVEF of 40-45%.  She has severe basal septal hypertrophy as well as severe left atrial enlargement.  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  However, her heart rate seems to be better controlled now that I beta-blocker has been added to her medical regimen.  We had a lengthy discussion today about treatment options.  Rhythm control is becoming more problematic because of potential adverse effects of amiodarone as well as decreased effectiveness over time.  Rate control would certainly be a reasonable option as long as we can control her rate well.  The patient has a very strong desire to maintain sinus rhythm as she feels that she is worse off and atrial fibrillation.  She is now been on an increased dose of amiodarone for greater than 4 weeks.  She strongly desires a repeat attempt at cardioversion.  I reviewed the risks, indications, and alternatives to cardioversion with the patient.  She understands and agrees to proceed.  She also understands that I feel the long-term effectiveness of another cardioversion is fairly low.  She has not missed any doses of apixaban.  She will be scheduled for cardioversion next week.  I have asked her to increase metoprolol to 25 mg twice daily.  If the patient has recurrent atrial fibrillation, options include rate control, change from amiodarone to dofetilide for continued attempts at  rhythm control, and ablation.  I highly doubt she would be a candidate for ablation because of advanced age and severe left atrial enlargement.  It seems to me that ultimately we will have to settle for rhythm control and I am concerned about her ability to stay on amiodarone long-term.  We will try to arrange follow-up with Roderic Palau in the atrial fibrillation clinic post-cardioversion.  Current medicines are reviewed with the patient today.  The patient does not have concerns regarding medicines.  Labs/ tests ordered today include:   Orders Placed This Encounter  Procedures  . Basic metabolic panel  . CBC with Differential/Platelet    Disposition:   FU Roderic Palau, NP-C after cardioversion.    Signed, Sherren Mocha, MD  10/20/2017 7:16 AM    [address] [place] [place], [place], [place]  [address] Phone: [phone]; Fax: [phone]

## 2017-10-18 NOTE — Patient Instructions (Signed)
Medication Instructions:  1) INCREASE METOPROLOL to 25 mg TWICE DAILY  Labwork: TODAY: BMET, CBC  Testing/Procedures: Your physician has recommended that you have a Cardioversion (DCCV). Electrical Cardioversion uses a jolt of electricity to your heart either through paddles or wired patches attached to your chest. This is a controlled, usually prescheduled, procedure. Defibrillation is done under light anesthesia in the hospital, and you usually go home the day of the procedure. This is done to get your heart back into a normal rhythm. You are not awake for the procedure. Please see the instruction sheet given to you today.  Follow-Up: Your provider recommends that you schedule a follow-up appointment as needed pending cardioversion results.  Any Other Special Instructions Will Be Listed Below (If Applicable).  You are scheduled for a Cardioversion/TEE Cardioversion on Thursday, October 25, 2017 with Dr. Acie Fredrickson.  Please arrive at the [facility] (Main Entrance A) at [facility]: [address] [place], [place] [address] at 11:00 am.  DIET: Nothing to eat or drink after midnight except a sip of water with medications (see medication instructions below)  Medication Instructions: 1) Hold METFORMIN the morning of your test.  2) Continue your anticoagulant: ELIQUIS You will need to continue your anticoagulant after your procedure until you are told by your provider that it is safe to stop  Labs: TODAY  You must have a responsible person to drive you home and stay in the waiting area during your procedure. Failure to do so could result in cancellation. Bring your insurance cards. *Special Note: Every effort is made to have your procedure done on time. Occasionally there are emergencies that occur at the hospital that may cause delays. Please be patient if a delay does occur.      If you need a refill on your cardiac medications before your next appointment, please call your  pharmacy.

## 2017-10-18 NOTE — Progress Notes (Signed)
Cardiology Office Note Date:  10/20/2017   ID:  [name], DOB 04-May-1931, MRN [id number]  PCP:  Lavone Orn, MD  Cardiologist:  Sherren Mocha, MD    Chief Complaint  Patient presents with  . Atrial Fibrillation     History of Present Illness: [name] is a 83 y.o. female who presents for follow-up of persistent atrial fibrillation.   The patient has had multiple cardioversions over the years.  She is been treated with amiodarone since 2015.  She also takes diltiazem and low-dose of metoprolol.  She is developed concerns about reduced DLCO and the effects of amiodarone on her eyes.  Her ophthalmologist saw some changes on her retinal exam but she has not had any symptomatic visual problems.  She is anticoagulated with apixaban.  Recently she has been followed closely in the atrial fib clinic.  She has undergone cardioversion but return to atrial fibrillation a few days later.  Her amnio had been reduced to 100 mg daily but now has been back up to 200 mg daily and she is tolerating this fine.  She notes that she was under a lot of family stress at the time of her last cardioversion and she comes in today to discuss further options.  The patient is here with her son today.  She complains of weakness, fatigue, shortness of breath, and balance problems.  She denies chest pain or pressure.  She does not have heart palpitations.  She denies edema in her legs.  She has had no recent falls.    Past Medical History:  Diagnosis Date  . ADENOCARCINOMA, BREAST 10/13/2010   Qualifier: Diagnosis of  By: Nils Pyle CMA (AAMA), Mearl Latin    . Allergy    SEASONAL  . Anemia    years ago  . Arthritis    "all my joints" (03/31/2014)  . Atrial fibrillation ([place])   . Atrial fibrillation with RVR ([place]) 03/31/2015   DLCO dropped from 100% in 2015-50% in 01/2017  . Barrett's esophagus   . Breast cancer [facility])    s/p right breast lumpectomy  . Cervical cancer ([place])   . DJD (degenerative joint  disease) of knee   . Dysrhythmia    Afib  . GERD (gastroesophageal reflux disease)   . Glaucoma   . H/O hiatal hernia   . Heart murmur   . Hx of transesophageal echocardiography (TEE) for monitoring    TEE (03/2014): No LAA clot, moderate LAE, core triatriatum type structure in RA with no stenosis, normal EF 55-60%, mild MR  . Hypertension   . Hypothyroidism, postsurgical   . Migraine    "last one was years ago" (03/31/2014)  . OSA on CPAP    settings at 10-11   . RECTAL FISSURE 10/13/2010   Qualifier: Diagnosis of  By: Nils Pyle CMA ([place]), Mearl Latin    . Sleep apnea   . Thyroid cancer [facility])    s/p thyroidectomy  . Type II diabetes mellitus ([place])    type II     Past Surgical History:  Procedure Laterality Date  . ABDOMINAL HYSTERECTOMY     "partial"  . BREAST BIOPSY Right   . BREAST LUMPECTOMY Right   . CARDIOVERSION N/A 03/30/2014   Procedure: CARDIOVERSION - BEDSIDE;  Surgeon: [name], MD;  Location: [place];  Service: Cardiovascular;  Laterality: N/A;  . CARDIOVERSION N/A 03/31/2014   Procedure: CARDIOVERSION  (BEDSIDE) ;  Surgeon: Lelon Perla, MD;  Location: [place];  Service: Cardiovascular;  Laterality: N/A;  . CARDIOVERSION N/A 04/27/2014   Procedure: CARDIOVERSION;  Surgeon: Darlin Coco, MD;  Location: [facility] ENDOSCOPY;  Service: Cardiovascular;  Laterality: N/A;  . CARDIOVERSION N/A 04/13/2015   Procedure: CARDIOVERSION;  Surgeon: Thayer Headings, MD;  Location: [place];  Service: Cardiovascular;  Laterality: N/A;  . CARDIOVERSION N/A 01/08/2017   Procedure: CARDIOVERSION;  Surgeon: Sanda Klein, MD;  Location: [facility] ENDOSCOPY;  Service: Cardiovascular;  Laterality: N/A;  . CARDIOVERSION N/A 09/19/2017   Procedure: CARDIOVERSION;  Surgeon: [name], MD;  Location: [place];  Service: Cardiovascular;  Laterality: N/A;  . CARPAL TUNNEL RELEASE Right   . CATARACT EXTRACTION W/ INTRAOCULAR LENS  IMPLANT, BILATERAL Bilateral   . COLONOSCOPY    . EXCISIONAL  HEMORRHOIDECTOMY    . INSERTION OF MESH N/A 05/25/2017   Procedure: INSERTION OF MESH;  Surgeon: Ralene Ok, MD;  Location: [facility];  Service: General;  Laterality: N/A;  . TEE WITHOUT CARDIOVERSION N/A 03/30/2014   Procedure: TRANSESOPHAGEAL ECHOCARDIOGRAM (TEE);  Surgeon: [name], MD;  Location: [facility] ENDOSCOPY;  Service: Cardiovascular;  Laterality: N/A;  . THYROIDECTOMY    . TONSILLECTOMY      Current Outpatient Medications  Medication Sig Dispense Refill  . acetaminophen (TYLENOL) 650 MG CR tablet Take 650 mg by mouth every 3 (three) hours.     Marland Kitchen amiodarone (PACERONE) 200 MG tablet TAKE 1 TABLET BY MOUTH  DAILY 90 tablet 2  . diltiazem (CARDIZEM CD) 360 MG 24 hr capsule TAKE 1 CAPSULE BY MOUTH  DAILY 90 capsule 3  . ELIQUIS 5 MG TABS tablet TAKE 1 TABLET BY MOUTH TWO  TIMES DAILY 180 tablet 3  . ferrous sulfate 325 (65 FE) MG tablet Take 325 mg by mouth daily with breakfast.    . fluticasone (FLONASE) 50 MCG/ACT nasal spray Place 1 spray into both nostrils at bedtime.     . lansoprazole (PREVACID) 30 MG capsule Take 1 capsule (30 mg total) by mouth 2 (two) times daily before a meal. 180 capsule 2  . levothyroxine (SYNTHROID, LEVOTHROID) 175 MCG tablet Take 175 mcg by mouth daily before breakfast.    . LORazepam (ATIVAN) 1 MG tablet Take 1.5 mg by mouth at bedtime.    Marland Kitchen losartan (COZAAR) 25 MG tablet Take 1 tablet (25 mg total) by mouth daily. 90 tablet 3  . Lutein 6 MG CAPS Take 6 mg by mouth daily.     . metFORMIN (GLUMETZA) 500 MG (MOD) 24 hr tablet Take 500 mg by mouth 2 (two) times daily with a meal.     . metoprolol tartrate (LOPRESSOR) 25 MG tablet Take 1 tablet (25 mg total) by mouth 2 (two) times daily. 180 tablet 3  . Multiple Vitamin (MULTIVITAMIN WITH MINERALS) TABS tablet Take 1 tablet by mouth daily.    . Multiple Vitamins-Minerals (ICAPS AREDS 2) CAPS Take 1 capsule by mouth daily.    . ONE TOUCH ULTRA TEST test strip 1 each by Other route 3 (three) times daily.  Use as directed    . ONETOUCH DELICA LANCETS 16X MISC 1 each by Other route 3 (three) times daily. Use as directed    . Polyvinyl Alcohol-Povidone PF (REFRESH) 1.4-0.6 % SOLN Place 1 drop into both eyes at bedtime.    . potassium chloride (KLOR-CON) 20 MEQ packet Take 20 mEq by mouth daily.     . rosuvastatin (CRESTOR) 10 MG tablet Take 10 mg by mouth at bedtime.    . sertraline (ZOLOFT) 50 MG tablet  Take 50 mg by mouth daily.    . vitamin B-12 (CYANOCOBALAMIN) 500 MCG tablet Take 500 mcg by mouth daily as needed (energy).     No current facility-administered medications for this visit.     Allergies:   Penicillins   Social History:  The patient  reports that  has never smoked. she has never used smokeless tobacco. She reports that she does not drink alcohol or use drugs.   Family History:  The patient's family history includes Diabetes in her brother, brother, brother, brother, brother, brother, brother, sister, sister, and sister; Heart attack in her brother; Heart disease in her brother, brother, brother, brother, brother, brother, and sister; Pancreatic cancer in her sister; Prostate cancer in her father.    ROS:  Please see the history of present illness.  Otherwise, review of systems is positive for cough.  All other systems are reviewed and negative.    PHYSICAL EXAM: VS:  BP 120/60   Pulse 86   Ht 5\' 6"  (1.676 m)   Wt 176 lb 12.8 oz (80.2 kg)   LMP  (LMP Unknown)   SpO2 97%   BMI 28.54 kg/m  , BMI Body mass index is 28.54 kg/m. GEN: Well nourished, well developed, pleasant elderly woman in no acute distress  HEENT: normal  Neck: no JVD, no masses. No carotid bruits Cardiac: irregularly irregular without murmur or gallop       Respiratory:  clear to auscultation bilaterally, normal work of breathing GI: soft, nontender, nondistended, + BS MS: no deformity or atrophy  Ext: no pretibial edema, pedal pulses 2+= bilaterally Skin: warm and dry, no rash Neuro:  Strength and  sensation are intact Psych: euthymic mood, full affect  EKG:  EKG is not ordered today.  Recent Labs: 06/07/2017: Magnesium 2.2 09/05/2017: ALT 15; TSH 2.630 10/18/2017: BUN 19; Creatinine, Ser 0.88; Hemoglobin 13.9; Platelets 186; Potassium 4.6; Sodium 143   Lipid Panel  No results found for: CHOL, TRIG, HDL, CHOLHDL, VLDL, LDLCALC, LDLDIRECT    Wt Readings from Last 3 Encounters:  10/18/17 176 lb 12.8 oz (80.2 kg)  10/08/17 178 lb (80.7 kg)  09/24/17 178 lb 12.8 oz (81.1 kg)     Cardiac Studies Reviewed: Echo 10-11-2017: Study Conclusions  - Left ventricle: Wall thickness was increased in a pattern of  moderate LVH. There was severe focal basal hypertrophy of the  septum - measuring 2.0 cm, consistent with possible hypertrophic  cardimyopathy. Systolic function was mildly to moderately  reduced. The estimated ejection fraction was in the range of 40%  to 45%. Diffuse hypokinesis. The study is not technically  sufficient to allow evaluation of LV diastolic function. LV  filling pressure appreas elevated. Ejection fraction (MOD,  2-plane): 41%.  - Aortic valve: Trileaflet. Sclerosis without stenosis. There was  no regurgitation.  - Mitral valve: Calcified annulus. Mildly thickened leaflets .  There was mild regurgitation. Valve area by continuity equation  (using LVOT flow): 1.77 cm^2.  - Left atrium: Severely dilated.  - Right ventricle: The cavity size was mildly dilated. Mildly  reduced systolic function.  - Tricuspid valve: There was moderate regurgitation.  - Pulmonary arteries: PA peak pressure: 33 mm Hg (S).  - Inferior vena cava: The vessel was normal in size. The  respirophasic diameter changes were in the normal range (>= 50%),  consistent with normal central venous pressure.  Impressions:  - Compared to a prior echo in 2016, a-fib is present. The LVEF is  lower at 40-45% with  global hypokinesis, there is moderate LVH  with severe proximal septal thickening -  possibly suggesting  hypertrophic cardiomyopathy, LV filling pressure is elevated,  there is mild MR, severe LAE, mild RVE with reduced RV systolic  function, moderate TR, RVSP of 33 mmHg, with a normal IVC.   ASSESSMENT AND PLAN: Persistent atrial fibrillation: The patient brings in heart rate readings today and they are improved.  She remains in atrial fibrillation with symptoms that may be associated with atrial fibrillation but somewhat unclear.  Her LV function is mildly reduced now with global hypokinesis of the left ventricle with an LVEF of 40-45%.  She has severe basal septal hypertrophy as well as severe left atrial enlargement.  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  However, her heart rate seems to be better controlled now that I beta-blocker has been added to her medical regimen.  We had a lengthy discussion today about treatment options.  Rhythm control is becoming more problematic because of potential adverse effects of amiodarone as well as decreased effectiveness over time.  Rate control would certainly be a reasonable option as long as we can control her rate well.  The patient has a very strong desire to maintain sinus rhythm as she feels that she is worse off and atrial fibrillation.  She is now been on an increased dose of amiodarone for greater than 4 weeks.  She strongly desires a repeat attempt at cardioversion.  I reviewed the risks, indications, and alternatives to cardioversion with the patient.  She understands and agrees to proceed.  She also understands that I feel the long-term effectiveness of another cardioversion is fairly low.  She has not missed any doses of apixaban.  She will be scheduled for cardioversion next week.  I have asked her to increase metoprolol to 25 mg twice daily.  If the patient has recurrent atrial fibrillation, options include rate control, change from amiodarone to dofetilide for continued attempts at  rhythm control, and ablation.  I highly doubt she would be a candidate for ablation because of advanced age and severe left atrial enlargement.  It seems to me that ultimately we will have to settle for rhythm control and I am concerned about her ability to stay on amiodarone long-term.  We will try to arrange follow-up with Roderic Palau in the atrial fibrillation clinic post-cardioversion.  Current medicines are reviewed with the patient today.  The patient does not have concerns regarding medicines.  Labs/ tests ordered today include:   Orders Placed This Encounter  Procedures  . Basic metabolic panel  . CBC with Differential/Platelet    Disposition:   FU Roderic Palau, NP-C after cardioversion.    Signed, Sherren Mocha, MD  10/20/2017 7:16 AM    [address], [place], [place]  [address] Phone: [phone]; Fax: [phone]

## 2017-10-19 LAB — CBC WITH DIFFERENTIAL/PLATELET
BASOS ABS: 0 10*3/uL (ref 0.0–0.2)
Basos: 0 %
EOS (ABSOLUTE): 0.4 10*3/uL (ref 0.0–0.4)
EOS: 7 %
Hematocrit: 41.9 % (ref 34.0–46.6)
Hemoglobin: 13.9 g/dL (ref 11.1–15.9)
IMMATURE GRANULOCYTES: 0 %
Immature Grans (Abs): 0 10*3/uL (ref 0.0–0.1)
LYMPHS ABS: 1.5 10*3/uL (ref 0.7–3.1)
Lymphs: 23 %
MCH: 32.4 pg (ref 26.6–33.0)
MCHC: 33.2 g/dL (ref 31.5–35.7)
MCV: 98 fL — ABNORMAL HIGH (ref 79–97)
MONOS ABS: 0.6 10*3/uL (ref 0.1–0.9)
Monocytes: 9 %
NEUTROS PCT: 61 %
Neutrophils Absolute: 4 10*3/uL (ref 1.4–7.0)
PLATELETS: 186 10*3/uL (ref 150–379)
RBC: 4.29 x10E6/uL (ref 3.77–5.28)
RDW: 13.4 % (ref 12.3–15.4)
WBC: 6.5 10*3/uL (ref 3.4–10.8)

## 2017-10-19 LAB — BASIC METABOLIC PANEL
BUN / CREAT RATIO: 22 (ref 12–28)
BUN: 19 mg/dL (ref 8–27)
CHLORIDE: 105 mmol/L (ref 96–106)
CO2: 25 mmol/L (ref 20–29)
CREATININE: 0.88 mg/dL (ref 0.57–1.00)
Calcium: 9.4 mg/dL (ref 8.7–10.3)
GFR calc Af Amer: 70 mL/min/{1.73_m2} (ref >59)
GFR calc non Af Amer: 61 mL/min/{1.73_m2} (ref >59)
GLUCOSE: 123 mg/dL — AB (ref 65–99)
Potassium: 4.6 mmol/L (ref 3.5–5.2)
SODIUM: 143 mmol/L (ref 134–144)

## 2017-10-20 ENCOUNTER — Encounter: Payer: Self-pay | Admitting: Cardiovascular Disease

## 2017-10-25 ENCOUNTER — Encounter (HOSPITAL_COMMUNITY)
Admission: RE | Disposition: A | Discharge status: Home / Self Care | Payer: Self-pay | Source: Ambulatory Visit | Attending: Cardiovascular Disease

## 2017-10-25 ENCOUNTER — Encounter (HOSPITAL_COMMUNITY): Payer: Self-pay | Admitting: *Deleted

## 2017-10-25 ENCOUNTER — Ambulatory Visit (HOSPITAL_COMMUNITY)
Admission: RE | Admit: 2017-10-25 | Discharge: 2017-10-25 | Disposition: A | Discharge status: Home / Self Care | Payer: Medicare Other | Source: Ambulatory Visit | Attending: Cardiovascular Disease | Admitting: Cardiovascular Disease

## 2017-10-25 ENCOUNTER — Other Ambulatory Visit: Payer: Self-pay

## 2017-10-25 ENCOUNTER — Ambulatory Visit (HOSPITAL_COMMUNITY): Payer: Medicare Other | Admitting: Anesthesiology

## 2017-10-25 ENCOUNTER — Other Ambulatory Visit: Payer: Self-pay | Admitting: Cardiovascular Disease

## 2017-10-25 DIAGNOSIS — Z8541 Personal history of malignant neoplasm of cervix uteri: Secondary | ICD-10-CM | POA: Insufficient documentation

## 2017-10-25 DIAGNOSIS — I10 Essential (primary) hypertension: Secondary | ICD-10-CM | POA: Insufficient documentation

## 2017-10-25 DIAGNOSIS — I081 Rheumatic disorders of both mitral and tricuspid valves: Secondary | ICD-10-CM | POA: Diagnosis not present

## 2017-10-25 DIAGNOSIS — G4733 Obstructive sleep apnea (adult) (pediatric): Secondary | ICD-10-CM | POA: Diagnosis not present

## 2017-10-25 DIAGNOSIS — M1990 Unspecified osteoarthritis, unspecified site: Secondary | ICD-10-CM | POA: Diagnosis not present

## 2017-10-25 DIAGNOSIS — Z7901 Long term (current) use of anticoagulants: Secondary | ICD-10-CM | POA: Insufficient documentation

## 2017-10-25 DIAGNOSIS — E890 Postprocedural hypothyroidism: Secondary | ICD-10-CM | POA: Insufficient documentation

## 2017-10-25 DIAGNOSIS — Z85850 Personal history of malignant neoplasm of thyroid: Secondary | ICD-10-CM | POA: Diagnosis not present

## 2017-10-25 DIAGNOSIS — Z7984 Long term (current) use of oral hypoglycemic drugs: Secondary | ICD-10-CM | POA: Diagnosis not present

## 2017-10-25 DIAGNOSIS — I481 Persistent atrial fibrillation: Secondary | ICD-10-CM | POA: Diagnosis present

## 2017-10-25 DIAGNOSIS — E119 Type 2 diabetes mellitus without complications: Secondary | ICD-10-CM | POA: Diagnosis not present

## 2017-10-25 DIAGNOSIS — Z9989 Dependence on other enabling machines and devices: Secondary | ICD-10-CM | POA: Diagnosis not present

## 2017-10-25 DIAGNOSIS — Z79899 Other long term (current) drug therapy: Secondary | ICD-10-CM | POA: Insufficient documentation

## 2017-10-25 DIAGNOSIS — K219 Gastro-esophageal reflux disease without esophagitis: Secondary | ICD-10-CM | POA: Insufficient documentation

## 2017-10-25 DIAGNOSIS — Z853 Personal history of malignant neoplasm of breast: Secondary | ICD-10-CM | POA: Insufficient documentation

## 2017-10-25 DIAGNOSIS — Z880 Allergy status to penicillin: Secondary | ICD-10-CM | POA: Insufficient documentation

## 2017-10-25 HISTORY — PX: CARDIOVERSION: SHX1299

## 2017-10-25 LAB — GLUCOSE, CAPILLARY: GLUCOSE-CAPILLARY: 108 mg/dL — AB (ref 65–99)

## 2017-10-25 SURGERY — CARDIOVERSION
Anesthesia: General

## 2017-10-25 MED ORDER — PROPOFOL 10 MG/ML IV BOLUS
INTRAVENOUS | Status: DC | PRN
  Administered 2017-10-25: 70 mg via INTRAVENOUS

## 2017-10-25 MED ORDER — PROPOFOL 10 MG/ML IV BOLUS
INTRAVENOUS | Status: AC
  Filled 2017-10-25: qty 20

## 2017-10-25 MED ORDER — LIDOCAINE HCL (CARDIAC) 20 MG/ML IV SOLN
INTRAVENOUS | Status: DC | PRN
  Administered 2017-10-25: 80 mg via INTRATRACHEAL

## 2017-10-25 MED ORDER — SODIUM CHLORIDE 0.9 % IV SOLN
INTRAVENOUS | Status: DC | PRN
  Administered 2017-10-25: 12:00:00 via INTRAVENOUS

## 2017-10-25 NOTE — Transfer of Care (Signed)
Immediate Anesthesia Transfer of Care Note  Patient: [name]  Procedure(s) Performed: CARDIOVERSION (N/A )  Patient Location: Endoscopy Unit  Anesthesia Type:General  Level of Consciousness: awake, alert  and oriented  Airway & Oxygen Therapy: Patient Spontanous Breathing and Patient connected to nasal cannula oxygen  Post-op Assessment: Report given to RN and Post -op Vital signs reviewed and stable  Post vital signs: Reviewed and stable  Last Vitals:  Vitals:   10/25/17 1058  BP: (!) 147/93  Pulse: 81  Resp: 20  Temp: 36.4 C  SpO2: 95%    Last Pain:  Vitals:   10/25/17 1058  TempSrc: Oral         Complications: No apparent anesthesia complications

## 2017-10-25 NOTE — Anesthesia Postprocedure Evaluation (Signed)
Anesthesia Post Note  Patient: [name]  Procedure(s) Performed: CARDIOVERSION (N/A )     Patient location during evaluation: PACU Anesthesia Type: General Level of consciousness: sedated Pain management: pain level controlled Vital Signs Assessment: post-procedure vital signs reviewed and stable Respiratory status: spontaneous breathing and respiratory function stable Cardiovascular status: stable Postop Assessment: no apparent nausea or vomiting Anesthetic complications: no    Last Vitals:  Vitals:   10/25/17 1224 10/25/17 1226  BP:  (!) 157/69  Pulse: (!) 52 (!) 54  Resp: (!) 23 17  Temp:  36.6 C  SpO2: 99% 100%    Last Pain:  Vitals:   10/25/17 1226  TempSrc: Oral                 Orestes Geiman DANIEL

## 2017-10-25 NOTE — Interval H&P Note (Signed)
History and Physical Interval Note:  10/25/2017 11:47 AM  [name]  has presented today for surgery, with the diagnosis of AFIB  The various methods of treatment have been discussed with the patient and family. After consideration of risks, benefits and other options for treatment, the patient has consented to  Procedure(s): CARDIOVERSION (N/A) as a surgical intervention .  The patient's history has been reviewed, patient examined, no change in status, stable for surgery.  I have reviewed the patient's chart and labs.  Questions were answered to the patient's satisfaction.     Mertie Moores

## 2017-10-25 NOTE — Anesthesia Preprocedure Evaluation (Addendum)
Anesthesia Evaluation  Patient identified by MRN, date of birth, ID band Patient awake    Reviewed: Allergy & Precautions, NPO status , Patient's Chart, lab work & pertinent test results  Airway Mallampati: II  TM Distance: >3 FB Neck ROM: Full    Dental  (+) Teeth Intact, Dental Advisory Given, Caps   Pulmonary sleep apnea and Continuous Positive Airway Pressure Ventilation ,    Pulmonary exam normal breath sounds clear to auscultation       Cardiovascular hypertension, + dysrhythmias Atrial Fibrillation  Rhythm:Irregular Rate:Normal  Echo 10/11/17: Compared to a prior echo in 2016, a-fib is present. The LVEF is lower at 40-45% with global hypokinesis, there is moderate LVH with severe proximal septal thickening - possibly suggesting hypertrophic cardiomyopathy, LV filling pressure is elevated, there is mild MR, severe LAE, mild RVE with reduced RV systolic function, moderate TR, RVSP of 33 mmHg, with a normal IVC.   Neuro/Psych  Headaches, PSYCHIATRIC DISORDERS Anxiety    GI/Hepatic GERD  Medicated,  Endo/Other  diabetes, Type 2, Oral Hypoglycemic AgentsHypothyroidism   Renal/GU      Musculoskeletal  (+) Arthritis ,   Abdominal   Peds  Hematology  (+) Blood dyscrasia (Eliquis), ,   Anesthesia Other Findings   Reproductive/Obstetrics                            Anesthesia Physical  Anesthesia Plan  ASA: III  Anesthesia Plan: General   Post-op Pain Management:    Induction: Intravenous  PONV Risk Score and Plan: 3 and Treatment may vary due to age or medical condition  Airway Management Planned: Mask  Additional Equipment:   Intra-op Plan:   Post-operative Plan:   Informed Consent: I have reviewed the patients History and Physical, chart, labs and discussed the procedure including the risks, benefits and alternatives for the proposed anesthesia with the patient or authorized  representative who has indicated his/her understanding and acceptance.     Plan Discussed with: CRNA and Anesthesiologist  Anesthesia Plan Comments:         Anesthesia Quick Evaluation

## 2017-10-25 NOTE — CV Procedure (Signed)
    Cardioversion Note  JAEDYNN BOHLKEN [id number] Mar 24, 1931  Procedure: DC Cardioversion Indications: Atrial fib   Procedure Details Consent: Obtained Time Out: Verified patient identification, verified procedure, site/side was marked, verified correct patient position, special equipment/implants available, Radiology Safety Procedures followed,  medications/allergies/relevent history reviewed, required imaging and test results available.  Performed  The patient has been on adequate anticoagulation.  The patient received IV Lidocaine 80 mg iV followed by  propofol 70 mg IV  for sedation.  Synchronous cardioversion was performed at 120, 200  joules.  The cardioversion was successful.     Complications: No apparent complications Patient did tolerate procedure well.   Thayer Headings, Brooke Bonito., MD, [facility] 10/25/2017, 12:35 PM

## 2017-10-26 ENCOUNTER — Telehealth (HOSPITAL_COMMUNITY): Payer: Self-pay | Admitting: *Deleted

## 2017-10-26 NOTE — Telephone Encounter (Signed)
Number on file rings someone picks up and then hangs up X2. Will try again on Monday.

## 2017-10-26 NOTE — Telephone Encounter (Signed)
-----   Message from Theodoro Parma, RN sent at 10/26/2017 12:25 PM EST ----- Regarding: afib clinic  [name] would like this patient scheduled in the Afib Clinic (she had a cardioversion yesterday).  Thank you!

## 2017-10-28 ENCOUNTER — Encounter (HOSPITAL_COMMUNITY): Payer: Self-pay | Admitting: Cardiovascular Disease

## 2017-10-29 ENCOUNTER — Ambulatory Visit: Payer: Medicare Other | Admitting: Pulmonary Disease

## 2017-11-02 ENCOUNTER — Encounter (HOSPITAL_COMMUNITY): Payer: Self-pay | Admitting: *Deleted

## 2017-11-02 NOTE — Telephone Encounter (Addendum)
2nd attempt to contact patient - rings with no voicemail. Will send letter to prompt pt to call for appointment.

## 2017-11-12 ENCOUNTER — Telehealth (HOSPITAL_COMMUNITY): Payer: Self-pay | Admitting: *Deleted

## 2017-11-12 NOTE — Telephone Encounter (Signed)
Pt cld reporting low HR at 49 since dccv.  Pt currently taking metoprolol 25 mg bid and diltiazem 360 mg qd.  Per Ceasar Lund pt should cut metoprolol in half and take 12.5 mg bid.  Pt should keep follow up appt made for Friday

## 2017-11-15 ENCOUNTER — Encounter: Payer: Self-pay | Admitting: Adult Health

## 2017-11-15 ENCOUNTER — Ambulatory Visit (INDEPENDENT_AMBULATORY_CARE_PROVIDER_SITE_OTHER)
Admission: RE | Admit: 2017-11-15 | Discharge: 2017-11-15 | Disposition: A | Discharge status: Home / Self Care | Payer: Medicare Other | Source: Ambulatory Visit | Attending: Adult Health | Admitting: Adult Health

## 2017-11-15 ENCOUNTER — Ambulatory Visit: Payer: Medicare Other | Admitting: Adult Health

## 2017-11-15 VITALS — BP 120/68 | HR 57 | Ht 66.0 in | Wt 179.2 lb

## 2017-11-15 DIAGNOSIS — R05 Cough: Secondary | ICD-10-CM

## 2017-11-15 DIAGNOSIS — R053 Chronic cough: Secondary | ICD-10-CM

## 2017-11-15 DIAGNOSIS — J209 Acute bronchitis, unspecified: Secondary | ICD-10-CM

## 2017-11-15 HISTORY — DX: Acute bronchitis, unspecified: J20.9

## 2017-11-15 MED ORDER — DOXYCYCLINE HYCLATE 100 MG PO TABS: 100.0000 mg | ORAL_TABLET | Freq: Two times a day (BID) | ORAL | 0 refills | Status: DC

## 2017-11-15 NOTE — Assessment & Plan Note (Signed)
URI/early bronchitis  CXR w/ no acute process noted.   Plan  . Patient Instructions  Doxycycline 100mg  Twice daily  For 7 days , take with food.  Mucinex  Twice daily  As needed  Cough/congestion  Delsym 2 tsp Twice daily  As needed  Cough .  Follow up with Dr. Elsworth Soho  In 6 with PFT /DLCO .  Please contact office for sooner follow up if symptoms do not improve or worsen or seek emergency care

## 2017-11-15 NOTE — Patient Instructions (Signed)
Doxycycline 100mg  Twice daily  For 7 days , take with food.  Mucinex  Twice daily  As needed  Cough/congestion  Delsym 2 tsp Twice daily  As needed  Cough .  Follow up with Dr. Elsworth Soho  In 6 with PFT /DLCO .  Please contact office for sooner follow up if symptoms do not improve or worsen or seek emergency care

## 2017-11-15 NOTE — Progress Notes (Signed)
@Patient  ID: [name], female    DOB: 10-31-1931, 84 y.o.   MRN: [id number]  Chief Complaint  Patient presents with  . Acute Visit    Cough     Referring provider: Lavone Orn, MD  HPI: 84 year old never smoker, retired Pharmacist, hospital followed for  chronic persistent cough since jan 2018   She has chronic atrial fibrillation and is maintained on amiodarone since ~2016 . On Eliquis  She has known large  hiatal hernia,demonstrated on her chest x-ray 09/2016 which shows at least 10 cm herniated stomach into the chest.  Breast cancer hx   Significant tests/ events reviewed 01/2017 FEV1 88%, ratio 72, FVC 91%, total lung capacity 96%, DLCO 52%. DLCO has dropped considerably since 2015 (was 100%)   01/2017 HRCT chest - no ILD , Large hiatal hernia with compressive atelectasis in the medial right lower lobe.  11/15/2017 Acute OV : Cough  Pt presents for an acute office visit.  Patient complains over the last week that she is had increased cough congestion with thick white yellow mucus.  She is felt weak and low energy.  She denies any fever hemoptysis orthopnea nausea vomiting.  She has chronic ankle edema without significant change.  Patient says she is eating good with no nausea or vomiting. Chest x-ray today shows clear lungs today .   Has Chronic A fib s/p Cardioversion 2 weeks ago.  Says she is felt weak since then.  She remains on amiodarone.  She is on Eliquis.  She denies any known bleeding.. Lab review showed normal hemoglobin on October 18, 2017.  Patient says she is eating good with no nausea or vomiting.  Patient has a known large hiatal hernia with known compressive atelectasis in the medial right lower lobe on previous CT chest.     Allergies  Allergen Reactions  . Penicillins Itching, Rash and Other (See Comments)    Has patient had a PCN reaction causing immediate rash, facial/tongue/throat swelling, SOB or lightheadedness with hypotension: Unknown, childhood  reaction Has patient had a PCN reaction causing severe rash involving mucus membranes or skin necrosis: No Has patient had a PCN reaction that required hospitalization No Has patient had a PCN reaction occurring within the last 10 years: No If all of the above answers are "NO", then may proceed with Cephalosporin use.     Immunization History  Administered Date(s) Administered  . Influenza Split 08/12/2017    Past Medical History:  Diagnosis Date  . ADENOCARCINOMA, BREAST 10/13/2010   Qualifier: Diagnosis of  By: Nils Pyle CMA (AAMA), Mearl Latin    . Allergy    SEASONAL  . Anemia    years ago  . Arthritis    "all my joints" (03/31/2014)  . Atrial fibrillation ([place])   . Atrial fibrillation with RVR ([place]) 03/31/2015   DLCO dropped from 100% in 2015-50% in 01/2017  . Barrett's esophagus   . Breast cancer [facility])    s/p right breast lumpectomy  . Cervical cancer ([place])   . DJD (degenerative joint disease) of knee   . Dysrhythmia    Afib  . GERD (gastroesophageal reflux disease)   . Glaucoma   . H/O hiatal hernia   . Heart murmur   . Hx of transesophageal echocardiography (TEE) for monitoring    TEE (03/2014): No LAA clot, moderate LAE, core triatriatum type structure in RA with no stenosis, normal EF 55-60%, mild MR  . Hypertension   . Hypothyroidism, postsurgical   . Migraine    "  last one was years ago" (03/31/2014)  . OSA on CPAP    settings at 10-11   . RECTAL FISSURE 10/13/2010   Qualifier: Diagnosis of  By: Nils Pyle CMA ([place]), Mearl Latin    . Sleep apnea   . Thyroid cancer [facility])    s/p thyroidectomy  . Type II diabetes mellitus ([place])    type II     Tobacco History: Social History   Tobacco Use  Smoking Status Never Smoker  Smokeless Tobacco Never Used   Counseling given: Not Answered   Outpatient Encounter Medications as of 11/15/2017  Medication Sig  . acetaminophen (TYLENOL) 650 MG CR tablet Take 650 mg by mouth every 8 (eight) hours as needed for pain.   Marland Kitchen amiodarone  (PACERONE) 200 MG tablet TAKE 1 TABLET BY MOUTH  DAILY (Patient taking differently: TAKE 200 MG BY MOUTH  DAILY)  . diltiazem (CARDIZEM CD) 360 MG 24 hr capsule TAKE 1 CAPSULE BY MOUTH  DAILY (Patient taking differently: TAKE 360 MG BY MOUTH  DAILY)  . ELIQUIS 5 MG TABS tablet TAKE 1 TABLET BY MOUTH TWO  TIMES DAILY (Patient taking differently: TAKE 5 MG BY MOUTH TWO  TIMES DAILY)  . ferrous sulfate 325 (65 FE) MG tablet Take 325 mg by mouth daily with breakfast.  . fluticasone (FLONASE) 50 MCG/ACT nasal spray Place 1 spray into both nostrils at bedtime.   . lansoprazole (PREVACID) 30 MG capsule Take 1 capsule (30 mg total) by mouth 2 (two) times daily before a meal. (Patient taking differently: Take 30 mg by mouth daily. )  . levothyroxine (SYNTHROID, LEVOTHROID) 175 MCG tablet Take 175 mcg by mouth daily before breakfast.  . LORazepam (ATIVAN) 1 MG tablet Take 1.5 mg by mouth at bedtime.  . Lutein 6 MG CAPS Take 6 mg by mouth daily.   . metFORMIN (GLUMETZA) 500 MG (MOD) 24 hr tablet Take 500 mg by mouth 2 (two) times daily with a meal.   . metoprolol tartrate (LOPRESSOR) 12.5 mg TABS tablet Take 12.5 mg by mouth 2 (two) times daily.  . Multiple Vitamin (MULTIVITAMIN WITH MINERALS) TABS tablet Take 1 tablet by mouth daily.  . Multiple Vitamins-Minerals (ICAPS AREDS 2) CAPS Take 1 capsule by mouth 2 (two) times daily.   . Polyvinyl Alcohol-Povidone PF (REFRESH) 1.4-0.6 % SOLN Place 1 drop into both eyes at bedtime.  . potassium chloride (KLOR-CON) 20 MEQ packet Take 20 mEq by mouth daily.   . rosuvastatin (CRESTOR) 10 MG tablet Take 10 mg by mouth at bedtime.  . sertraline (ZOLOFT) 50 MG tablet Take 50 mg by mouth daily.  . vitamin B-12 (CYANOCOBALAMIN) 500 MCG tablet Take 500 mcg by mouth daily as needed (energy).  . [DISCONTINUED] metoprolol tartrate (LOPRESSOR) 25 MG tablet Take 1 tablet (25 mg total) by mouth 2 (two) times daily. (Patient taking differently: Take 12.5 mg by mouth 2 (two) times  daily. )  . doxycycline (VIBRA-TABS) 100 MG tablet Take 1 tablet (100 mg total) by mouth 2 (two) times daily.  Marland Kitchen losartan (COZAAR) 25 MG tablet Take 1 tablet (25 mg total) by mouth daily.   No facility-administered encounter medications on file as of 11/15/2017.      Review of Systems  Constitutional:   No  weight loss, night sweats,  Fevers, chills, fatigue, or  lassitude.  HEENT:   No headaches,  Difficulty swallowing,  Tooth/dental problems, or  Sore throat,  No sneezing, itching, ear ache,  +nasal congestion, post nasal drip,   CV:  No chest pain,  Orthopnea, PND, swelling in lower extremities, anasarca, dizziness, palpitations, syncope.   GI  No heartburn, indigestion, abdominal pain, nausea, vomiting, diarrhea, change in bowel habits, loss of appetite, bloody stools.   Resp:    No chest wall deformity  Skin: no rash or lesions.  GU: no dysuria, change in color of urine, no urgency or frequency.  No flank pain, no hematuria   MS:  No joint pain or swelling.  No decreased range of motion.  No back pain.    Physical Exam  BP 120/68 (BP Location: Left Arm, Cuff Size: Normal)   Pulse (!) 57   Ht 5\' 6"  (1.676 m)   Wt 179 lb 3.2 oz (81.3 kg)   LMP  (LMP Unknown)   SpO2 94%   BMI 28.92 kg/m   GEN: A/Ox3; pleasant , NAD, elderly    HEENT:  [place]/AT,  EACs-clear, TMs-wnl, NOSE-clear, THROAT-clear, no lesions, no postnasal drip or exudate noted.   NECK:  Supple w/ fair ROM; no JVD; normal carotid impulses w/o bruits; no thyromegaly or nodules palpated; no lymphadenopathy.    RESP  Clear  P & A; w/o, wheezes/ rales/ or rhonchi. no accessory muscle use, no dullness to percussion  CARD:  RRR, no m/r/g, no peripheral edema, pulses intact, no cyanosis or clubbing.  GI:   Soft & nt; nml bowel sounds; no organomegaly or masses detected.   Musco: Warm bil, no deformities or joint swelling noted.   Neuro: alert, no focal deficits noted.    Skin: Warm, no lesions or  rashes    Lab Results:  CBC   BMET  No results found for: BNP  ProBNP No results found for: PROBNP  Imaging: Dg Chest 2 View  Result Date: 11/15/2017 CLINICAL DATA:  Chronic cough.  History breast cancer EXAM: CHEST  2 VIEW COMPARISON:  09/14/2017 FINDINGS: Heart size mildly enlarged. Negative for heart failure. Lungs are clear without infiltrate effusion or mass. Left mastectomy. Surgical clips right axilla. Hiatal hernia improved from the prior study. IMPRESSION: No active cardiopulmonary disease. Electronically Signed   By: Franchot Gallo M.D.   On: 11/15/2017 11:32     Assessment & Plan:   Acute bronchitis URI/early bronchitis  CXR w/ no acute process noted.   Plan  . Patient Instructions  Doxycycline 100mg  Twice daily  For 7 days , take with food.  Mucinex  Twice daily  As needed  Cough/congestion  Delsym 2 tsp Twice daily  As needed  Cough .  Follow up with Dr. Elsworth Soho  In 6 with PFT /DLCO .  Please contact office for sooner follow up if symptoms do not improve or worsen or seek emergency care       Chronic cough Cont on cough regimen  GERD tx  Check Spirometry with DLCO on return in 6 weeks.       Rexene Edison, NP 11/15/2017

## 2017-11-15 NOTE — Assessment & Plan Note (Signed)
Cont on cough regimen  GERD tx  Check Spirometry with DLCO on return in 6 weeks.

## 2017-11-16 ENCOUNTER — Encounter (HOSPITAL_COMMUNITY): Payer: Self-pay | Admitting: Nurse Practitioner

## 2017-11-16 ENCOUNTER — Ambulatory Visit (HOSPITAL_COMMUNITY)
Admission: RE | Admit: 2017-11-16 | Discharge: 2017-11-16 | Disposition: A | Discharge status: Home / Self Care | Payer: Medicare Other | Source: Ambulatory Visit | Attending: Nurse Practitioner | Admitting: Nurse Practitioner

## 2017-11-16 VITALS — BP 120/62 | HR 46 | Ht 66.0 in | Wt 180.2 lb

## 2017-11-16 DIAGNOSIS — Z79899 Other long term (current) drug therapy: Secondary | ICD-10-CM | POA: Diagnosis not present

## 2017-11-16 DIAGNOSIS — Z8042 Family history of malignant neoplasm of prostate: Secondary | ICD-10-CM | POA: Diagnosis not present

## 2017-11-16 DIAGNOSIS — I481 Persistent atrial fibrillation: Secondary | ICD-10-CM

## 2017-11-16 DIAGNOSIS — E039 Hypothyroidism, unspecified: Secondary | ICD-10-CM | POA: Diagnosis not present

## 2017-11-16 DIAGNOSIS — R001 Bradycardia, unspecified: Secondary | ICD-10-CM | POA: Insufficient documentation

## 2017-11-16 DIAGNOSIS — Z880 Allergy status to penicillin: Secondary | ICD-10-CM | POA: Diagnosis not present

## 2017-11-16 DIAGNOSIS — Z90711 Acquired absence of uterus with remaining cervical stump: Secondary | ICD-10-CM | POA: Insufficient documentation

## 2017-11-16 DIAGNOSIS — K2270 Barrett's esophagus without dysplasia: Secondary | ICD-10-CM | POA: Diagnosis not present

## 2017-11-16 DIAGNOSIS — E119 Type 2 diabetes mellitus without complications: Secondary | ICD-10-CM | POA: Diagnosis not present

## 2017-11-16 DIAGNOSIS — Z9841 Cataract extraction status, right eye: Secondary | ICD-10-CM | POA: Diagnosis not present

## 2017-11-16 DIAGNOSIS — I422 Other hypertrophic cardiomyopathy: Secondary | ICD-10-CM | POA: Diagnosis not present

## 2017-11-16 DIAGNOSIS — Z9889 Other specified postprocedural states: Secondary | ICD-10-CM | POA: Diagnosis not present

## 2017-11-16 DIAGNOSIS — H409 Unspecified glaucoma: Secondary | ICD-10-CM | POA: Diagnosis not present

## 2017-11-16 DIAGNOSIS — I10 Essential (primary) hypertension: Secondary | ICD-10-CM | POA: Diagnosis not present

## 2017-11-16 DIAGNOSIS — Z800 Family history of malignant neoplasm of digestive organs: Secondary | ICD-10-CM | POA: Diagnosis not present

## 2017-11-16 DIAGNOSIS — Z9842 Cataract extraction status, left eye: Secondary | ICD-10-CM | POA: Diagnosis not present

## 2017-11-16 DIAGNOSIS — Z8249 Family history of ischemic heart disease and other diseases of the circulatory system: Secondary | ICD-10-CM | POA: Insufficient documentation

## 2017-11-16 DIAGNOSIS — Z85850 Personal history of malignant neoplasm of thyroid: Secondary | ICD-10-CM | POA: Insufficient documentation

## 2017-11-16 DIAGNOSIS — Z7984 Long term (current) use of oral hypoglycemic drugs: Secondary | ICD-10-CM | POA: Diagnosis not present

## 2017-11-16 DIAGNOSIS — G4733 Obstructive sleep apnea (adult) (pediatric): Secondary | ICD-10-CM | POA: Insufficient documentation

## 2017-11-16 DIAGNOSIS — Z8541 Personal history of malignant neoplasm of cervix uteri: Secondary | ICD-10-CM | POA: Insufficient documentation

## 2017-11-16 DIAGNOSIS — I4819 Other persistent atrial fibrillation: Secondary | ICD-10-CM

## 2017-11-16 DIAGNOSIS — Z833 Family history of diabetes mellitus: Secondary | ICD-10-CM | POA: Insufficient documentation

## 2017-11-16 DIAGNOSIS — Z853 Personal history of malignant neoplasm of breast: Secondary | ICD-10-CM | POA: Diagnosis not present

## 2017-11-16 DIAGNOSIS — K219 Gastro-esophageal reflux disease without esophagitis: Secondary | ICD-10-CM | POA: Diagnosis not present

## 2017-11-16 NOTE — Progress Notes (Signed)
Primary Care Physician: Lavone Orn, MD Cardiologist: [name] Referral provider: Truitt Merle, NP Pulmonologist: Dr. Elsworth Soho GI: Dr. Dolphus Jenny I Postel is a 84 y.o. female with a h/o afib that has been on amiodarone since June of 2015. She had afib 12/2016, in the setting of persistent cough x 6-8 weeks  that had not responded to two rounds of prednisone and antibiotics. She had also been using cough syrup with decongestants. Her Cardizem was increased and  BB was added with good slowing of v rate and with slower heart rate, she does feel better but persists in afib. She is not really wanting a cardioversion. Her biggest concern was  cough. She did have a CXR and other than a large hiatal hernia, there were no other abnormalities noted. TSH had also been mildly elevated but PCP aware and was monitoring. .Continues on eliquis. She did have repair of Hiatal hernia 05/25/17.  She is in the  afib clinic for return to afib in October . She had been trying to wean off amiodarone with concerns re her lungs and her eye doctor could see evidence of amio use, but she states that her vision had not changed. CT chest in March did not show any evidence of lung toxicity for amiodarone. She is pending another eval with lung doctor 11/13.  She increased her amio back to 200 mg a day about 10 days ago.  We discussed options to amiodarone, with either Tikosyn or sotalol and would have to let amio wash out and may take 2-3 months before eligible for the other drugs. She does not think she can tolerate afib for that period of time. We also discussed possible av nodal ablation and PPM but she does not like this option as well. For now she is happy to use the amiodarone. No missed doses of eliquis.She is tolerating the afib.  F/u in the afib clinic 09/24/17. She had successful cardioversion 11/7, but went back into afib this weekend. She  states that it is because her grandson came by 3 different times recently  asking for money and he stressed her out too much, causing her to go back into afib.. She tells me in the past, another time she went into afib  because a daughter asked her for $7000. She does not feel that different in afib, noticed it because of increase in her HR to around 100. She was suppose to f/u with Pulmonologist tomorrow, but moved appointment into December as she felt she would not be able to do PFT's in afib.  F/u in the afib clinic, 11/26, for adjustment in rate control with metoprolol 12.5 mg bid on last visit. She is better rate controlled in the 80's, BP on softer side and will stop hctz. She is saying she would like to try cardioversion one more time. I questioned her closely to see how symptomatic she really is in afib and the only way she knows she is in afib is because of her pulse ox. She does not seem to be functionally compromised with afib. We discussed just staying in afib as an option. Will update echo to make sure EF has not declined.  She is back in afib clinic 11/16/17. She saw [name] and repeat cardioversion was performed. This was successful and she called to clinic after cardioversion due to HR in the 40's. She was told to reduce metoprolol to 12.5 mg bid. In the office, she continues on 12.5 mg bid and  HR is SR at 46 bpm. She is not symptomatic with this and pt will continue to monitor at home.  Today, she denies symptoms of chest pain, shortness of breath, orthopnea, PND, lower extremity edema, dizziness, presyncope, syncope, or neurologic sequela.  The patient is tolerating medications without difficulties and is otherwise without complaint today.   Past Medical History:  Diagnosis Date  . ADENOCARCINOMA, BREAST 10/13/2010   Qualifier: Diagnosis of  By: Nils Pyle CMA (AAMA), Mearl Latin    . Allergy    SEASONAL  . Anemia    years ago  . Arthritis    "all my joints" (03/31/2014)  . Atrial fibrillation ([place])   . Atrial fibrillation with RVR ([place]) 03/31/2015   DLCO dropped  from 100% in 2015-50% in 01/2017  . Barrett's esophagus   . Breast cancer [facility])    s/p right breast lumpectomy  . Cervical cancer ([place])   . DJD (degenerative joint disease) of knee   . Dysrhythmia    Afib  . GERD (gastroesophageal reflux disease)   . Glaucoma   . H/O hiatal hernia   . Heart murmur   . Hx of transesophageal echocardiography (TEE) for monitoring    TEE (03/2014): No LAA clot, moderate LAE, core triatriatum type structure in RA with no stenosis, normal EF 55-60%, mild MR  . Hypertension   . Hypothyroidism, postsurgical   . Migraine    "last one was years ago" (03/31/2014)  . OSA on CPAP    settings at 10-11   . RECTAL FISSURE 10/13/2010   Qualifier: Diagnosis of  By: Nils Pyle CMA ([place]), Mearl Latin    . Sleep apnea   . Thyroid cancer [facility])    s/p thyroidectomy  . Type II diabetes mellitus ([place])    type II    Past Surgical History:  Procedure Laterality Date  . ABDOMINAL HYSTERECTOMY     "partial"  . BREAST BIOPSY Right   . BREAST LUMPECTOMY Right   . CARDIOVERSION N/A 03/30/2014   Procedure: CARDIOVERSION - BEDSIDE;  Surgeon: [name], MD;  Location: [place];  Service: Cardiovascular;  Laterality: N/A;  . CARDIOVERSION N/A 03/31/2014   Procedure: CARDIOVERSION  (BEDSIDE) ;  Surgeon: Lelon Perla, MD;  Location: [place];  Service: Cardiovascular;  Laterality: N/A;  . CARDIOVERSION N/A 04/27/2014   Procedure: CARDIOVERSION;  Surgeon: Darlin Coco, MD;  Location: [place];  Service: Cardiovascular;  Laterality: N/A;  . CARDIOVERSION N/A 04/13/2015   Procedure: CARDIOVERSION;  Surgeon: Thayer Headings, MD;  Location: [place];  Service: Cardiovascular;  Laterality: N/A;  . CARDIOVERSION N/A 01/08/2017   Procedure: CARDIOVERSION;  Surgeon: Sanda Klein, MD;  Location[facility];  Service: Cardiovascular;  Laterality: N/A;  . CARDIOVERSION N/A 09/19/2017   Procedure: CARDIOVERSION;  Surgeon: [name], MD;  Location: [facility] END[facility]COPY;  Service:  Cardiovascular;  Laterality: N/A;  . CARDIOVERSION N/A 10/25/2017   Procedure: CARDIOVERSION;  Surgeon: Acie Fredrickson Wonda Cheng, MD;  Location: [place];  Service: Cardiovascular;  Laterality: N/A;  . CARPAL TUNNEL RELEASE Right   . CATARACT EXTRACTION W/ INTRAOCULAR LENS  IMPLANT, BILATERAL Bilateral   . COLONOSCOPY    . EXCISIONAL HEMORRHOIDECTOMY    . INSERTION OF MESH N/A 05/25/2017   Procedure: INSERTION OF MESH;  Surgeon: Ralene Ok, MD;  Location: [facility];  Service: General;  Laterality: N/A;  . TEE WITHOUT CARDIOVERSION N/A 03/30/2014   Procedure: TRANSESOPHAGEAL ECHOCARDIOGRAM (TEE);  Surgeon: [name], MD;  Location: [place];  Service: Cardiovascular;  Laterality:  N/A;  . THYROIDECTOMY    . TONSILLECTOMY      Current Outpatient Medications  Medication Sig Dispense Refill  . acetaminophen (TYLENOL) 650 MG CR tablet Take 650 mg by mouth every 8 (eight) hours as needed for pain.     Marland Kitchen amiodarone (PACERONE) 200 MG tablet TAKE 1 TABLET BY MOUTH  DAILY (Patient taking differently: TAKE 200 MG BY MOUTH  DAILY) 90 tablet 2  . diltiazem (CARDIZEM CD) 360 MG 24 hr capsule TAKE 1 CAPSULE BY MOUTH  DAILY (Patient taking differently: TAKE 360 MG BY MOUTH  DAILY) 90 capsule 3  . doxycycline (VIBRA-TABS) 100 MG tablet Take 1 tablet (100 mg total) by mouth 2 (two) times daily. 14 tablet 0  . ELIQUIS 5 MG TABS tablet TAKE 1 TABLET BY MOUTH TWO  TIMES DAILY 180 tablet 3  . ferrous sulfate 325 (65 FE) MG tablet Take 325 mg by mouth daily with breakfast.    . fluticasone (FLONASE) 50 MCG/ACT nasal spray Place 1 spray into both nostrils at bedtime.     . lansoprazole (PREVACID) 30 MG capsule Take 1 capsule (30 mg total) by mouth 2 (two) times daily before a meal. (Patient taking differently: Take 30 mg by mouth daily. ) 180 capsule 2  . levothyroxine (SYNTHROID, LEVOTHROID) 175 MCG tablet Take 175 mcg by mouth daily before breakfast.    . LORazepam (ATIVAN) 1 MG tablet Take 1.5 mg by mouth  at bedtime.    Marland Kitchen losartan (COZAAR) 25 MG tablet Take 1 tablet (25 mg total) by mouth daily. 90 tablet 3  . Lutein 6 MG CAPS Take 6 mg by mouth daily.     . metFORMIN (GLUMETZA) 500 MG (MOD) 24 hr tablet Take 500 mg by mouth 2 (two) times daily with a meal.     . metoprolol tartrate (LOPRESSOR) 12.5 mg TABS tablet Take 12.5 mg by mouth 2 (two) times daily.    . Multiple Vitamin (MULTIVITAMIN WITH MINERALS) TABS tablet Take 1 tablet by mouth daily.    . Multiple Vitamins-Minerals (ICAPS AREDS 2) CAPS Take 1 capsule by mouth 2 (two) times daily.     . Polyvinyl Alcohol-Povidone PF (REFRESH) 1.4-0.6 % SOLN Place 1 drop into both eyes at bedtime.    . potassium chloride (KLOR-CON) 20 MEQ packet Take 20 mEq by mouth daily.     . rosuvastatin (CRESTOR) 10 MG tablet Take 10 mg by mouth at bedtime.    . sertraline (ZOLOFT) 50 MG tablet Take 50 mg by mouth daily.    . vitamin B-12 (CYANOCOBALAMIN) 500 MCG tablet Take 500 mcg by mouth daily as needed (energy).     No current facility-administered medications for this encounter.     Allergies  Allergen Reactions  . Penicillins Itching, Rash and Other (See Comments)    Has patient had a PCN reaction causing immediate rash, facial/tongue/throat swelling, SOB or lightheadedness with hypotension: Unknown, childhood reaction Has patient had a PCN reaction causing severe rash involving mucus membranes or skin necrosis: No Has patient had a PCN reaction that required hospitalization No Has patient had a PCN reaction occurring within the last 10 years: No If all of the above answers are "NO", then may proceed with Cephalosporin use.     Social History   Socioeconomic History  . Marital status: Divorced    Spouse name: Not on file  . Number of children: 2  . Years of education: Not on file  . Highest education level: Not on file  Social Needs  . Financial resource strain: Not on file  . Food insecurity - worry: Not on file  . Food insecurity -  inability: Not on file  . Transportation needs - medical: Not on file  . Transportation needs - non-medical: Not on file  Occupational History  . Occupation: Retired  Tobacco Use  . Smoking status: Never Smoker  . Smokeless tobacco: Never Used  Substance and Sexual Activity  . Alcohol use: No    Alcohol/week: 0.0 oz  . Drug use: No  . Sexual activity: No  Other Topics Concern  . Not on file  Social History Narrative  . Not on file    Family History  Problem Relation Age of Onset  . Prostate cancer Father   . Diabetes Sister   . Heart disease Sister   . Heart attack Brother   . Heart disease Brother   . Diabetes Brother   . Diabetes Sister   . Diabetes Sister   . Pancreatic cancer Sister   . Diabetes Brother   . Heart disease Brother   . Diabetes Brother   . Heart disease Brother   . Diabetes Brother   . Heart disease Brother   . Diabetes Brother   . Heart disease Brother   . Diabetes Brother   . Heart disease Brother   . Diabetes Brother     ROS- All systems are reviewed and negative except as per the HPI above  Physical Exam: Vitals:   11/16/17 0923  BP: 120/62  Pulse: (!) 46  Weight: 180 lb 3.2 oz (81.7 kg)  Height: 5\' 6"  (1.676 m)   Wt Readings from Last 3 Encounters:  11/16/17 180 lb 3.2 oz (81.7 kg)  11/15/17 179 lb 3.2 oz (81.3 kg)  10/25/17 173 lb (78.5 kg)    Labs: Lab Results  Component Value Date   NA 143 10/18/2017   K 4.6 10/18/2017   CL 105 10/18/2017   CO2 25 10/18/2017   GLUCOSE 123 (H) 10/18/2017   BUN 19 10/18/2017   CREATININE 0.88 10/18/2017   CALCIUM 9.4 10/18/2017   MG 2.2 06/07/2017   Lab Results  Component Value Date   INR 1.12 04/20/2016   No results found for: CHOL, HDL, LDLCALC, TRIG   GEN- The patient is well appearing, alert and oriented x 3 today.   Head- normocephalic, atraumatic Eyes-  Sclera clear, conjunctiva pink Ears- hearing intact Oropharynx- clear Neck- supple, no JVP Lymph- no cervical  lymphadenopathy Lungs- Clear to ausculation bilaterally, normal work of breathing Heart-  regular rate and rhythm, no murmurs, rubs or gallops, PMI not laterally displaced GI- soft, NT, ND, + BS Extremities- no clubbing, cyanosis, or edema MS- no significant deformity or atrophy Skin- no rash or lesion Psych- euthymic mood, full affect Neuro- strength and sensation are intact  EKG- Sinus brady at 46 bpm, pr int 202 ms, qrs int 92 ms, qtc 442 ms Epic records reviewed Echo-Study Conclusions  - Left ventricle: Wall thickness was increased in a pattern of   moderate LVH. There was severe focal basal hypertrophy of the   septum - measuring 2.0 cm, consistent with possible hypertrophic   cardimyopathy. Systolic function was mildly to moderately   reduced. The estimated ejection fraction was in the range of 40%   to 45%. Diffuse hypokinesis. The study is not technically   sufficient to allow evaluation of LV diastolic function. LV   filling pressure appreas elevated. Ejection fraction (MOD,   2-plane): 41%. -  Aortic valve: Trileaflet. Sclerosis without stenosis. There was   no regurgitation. - Mitral valve: Calcified annulus. Mildly thickened leaflets .   There was mild regurgitation. Valve area by continuity equation   (using LVOT flow): 1.77 cm^2. - Left atrium: Severely dilated. - Right ventricle: The cavity size was mildly dilated. Mildly   reduced systolic function. - Tricuspid valve: There was moderate regurgitation. - Pulmonary arteries: PA peak pressure: 33 mm Hg (S). - Inferior vena cava: The vessel was normal in size. The   respirophasic diameter changes were in the normal range (>= 50%),   consistent with normal central venous pressure.  Impressions:  - Compared to a prior echo in 2016, a-fib is present. The LVEF is   lower at 40-45% with global hypokinesis, there is moderate LVH   with severe proximal septal thickening - possibly suggesting   hypertrophic  cardiomyopathy, LV filling pressure is elevated,   there is mild MR, severe LAE, mild RVE with reduced RV systolic   function, moderate TR, RVSP of 33 mmHg, with a normal IVC.     Assessment and Plan: 1. Persistent afib Successful cardioversion 12/13 Continue amiodarone  200 mg a day Continue metoprolol tartrate 25 mg 1/2 tab bid  Continue cardizem at 360 mg a day Continue eliquis 5 mg bid for chadsvasc score of at least 5, states no missed doses  2. Bradycardia Is not symptomatic and will continue to watch HR at home I feel that if BB is d/c she will likely go back into afib She is aware to report any HR's below 40 bpm or if she becomes lightheaded or presyncopal, short of breath   F/u in one month   Butch Penny C. Shamyah Stantz, [facility] [address] [place], [place] [address] [phone]

## 2017-11-20 ENCOUNTER — Ambulatory Visit: Payer: Medicare Other | Admitting: Pulmonary Disease

## 2017-11-25 ENCOUNTER — Other Ambulatory Visit: Payer: Self-pay | Admitting: Cardiovascular Disease

## 2017-12-18 ENCOUNTER — Ambulatory Visit (HOSPITAL_COMMUNITY): Payer: Medicare Other | Admitting: Nurse Practitioner

## 2017-12-20 ENCOUNTER — Ambulatory Visit (HOSPITAL_COMMUNITY)
Admission: RE | Admit: 2017-12-20 | Discharge: 2017-12-20 | Disposition: A | Discharge status: Home / Self Care | Payer: Medicare Other | Source: Ambulatory Visit | Attending: Nurse Practitioner | Admitting: Nurse Practitioner

## 2017-12-20 ENCOUNTER — Encounter (HOSPITAL_COMMUNITY): Payer: Self-pay | Admitting: Nurse Practitioner

## 2017-12-20 VITALS — BP 110/56 | HR 55 | Ht 66.0 in | Wt 181.0 lb

## 2017-12-20 DIAGNOSIS — Z7984 Long term (current) use of oral hypoglycemic drugs: Secondary | ICD-10-CM | POA: Insufficient documentation

## 2017-12-20 DIAGNOSIS — H409 Unspecified glaucoma: Secondary | ICD-10-CM | POA: Insufficient documentation

## 2017-12-20 DIAGNOSIS — Z880 Allergy status to penicillin: Secondary | ICD-10-CM | POA: Insufficient documentation

## 2017-12-20 DIAGNOSIS — I422 Other hypertrophic cardiomyopathy: Secondary | ICD-10-CM | POA: Insufficient documentation

## 2017-12-20 DIAGNOSIS — Z853 Personal history of malignant neoplasm of breast: Secondary | ICD-10-CM | POA: Diagnosis not present

## 2017-12-20 DIAGNOSIS — I4819 Other persistent atrial fibrillation: Secondary | ICD-10-CM

## 2017-12-20 DIAGNOSIS — Z79899 Other long term (current) drug therapy: Secondary | ICD-10-CM | POA: Insufficient documentation

## 2017-12-20 DIAGNOSIS — Z9889 Other specified postprocedural states: Secondary | ICD-10-CM | POA: Diagnosis not present

## 2017-12-20 DIAGNOSIS — Z7902 Long term (current) use of antithrombotics/antiplatelets: Secondary | ICD-10-CM | POA: Diagnosis not present

## 2017-12-20 DIAGNOSIS — Z9841 Cataract extraction status, right eye: Secondary | ICD-10-CM | POA: Diagnosis not present

## 2017-12-20 DIAGNOSIS — R001 Bradycardia, unspecified: Secondary | ICD-10-CM | POA: Insufficient documentation

## 2017-12-20 DIAGNOSIS — M1990 Unspecified osteoarthritis, unspecified site: Secondary | ICD-10-CM | POA: Diagnosis not present

## 2017-12-20 DIAGNOSIS — I481 Persistent atrial fibrillation: Secondary | ICD-10-CM

## 2017-12-20 DIAGNOSIS — G4733 Obstructive sleep apnea (adult) (pediatric): Secondary | ICD-10-CM | POA: Insufficient documentation

## 2017-12-20 DIAGNOSIS — Z79890 Hormone replacement therapy (postmenopausal): Secondary | ICD-10-CM | POA: Diagnosis not present

## 2017-12-20 DIAGNOSIS — K2270 Barrett's esophagus without dysplasia: Secondary | ICD-10-CM | POA: Insufficient documentation

## 2017-12-20 DIAGNOSIS — Z8042 Family history of malignant neoplasm of prostate: Secondary | ICD-10-CM | POA: Insufficient documentation

## 2017-12-20 DIAGNOSIS — Z8541 Personal history of malignant neoplasm of cervix uteri: Secondary | ICD-10-CM | POA: Insufficient documentation

## 2017-12-20 DIAGNOSIS — E119 Type 2 diabetes mellitus without complications: Secondary | ICD-10-CM | POA: Diagnosis not present

## 2017-12-20 DIAGNOSIS — Z800 Family history of malignant neoplasm of digestive organs: Secondary | ICD-10-CM | POA: Insufficient documentation

## 2017-12-20 DIAGNOSIS — Z85850 Personal history of malignant neoplasm of thyroid: Secondary | ICD-10-CM | POA: Insufficient documentation

## 2017-12-20 DIAGNOSIS — I10 Essential (primary) hypertension: Secondary | ICD-10-CM | POA: Insufficient documentation

## 2017-12-20 DIAGNOSIS — E039 Hypothyroidism, unspecified: Secondary | ICD-10-CM | POA: Insufficient documentation

## 2017-12-20 DIAGNOSIS — Z833 Family history of diabetes mellitus: Secondary | ICD-10-CM | POA: Insufficient documentation

## 2017-12-20 DIAGNOSIS — Z90711 Acquired absence of uterus with remaining cervical stump: Secondary | ICD-10-CM | POA: Diagnosis not present

## 2017-12-20 DIAGNOSIS — K219 Gastro-esophageal reflux disease without esophagitis: Secondary | ICD-10-CM | POA: Insufficient documentation

## 2017-12-20 DIAGNOSIS — Z8249 Family history of ischemic heart disease and other diseases of the circulatory system: Secondary | ICD-10-CM | POA: Insufficient documentation

## 2017-12-20 DIAGNOSIS — Z9842 Cataract extraction status, left eye: Secondary | ICD-10-CM | POA: Insufficient documentation

## 2017-12-20 NOTE — Progress Notes (Signed)
Primary Care Physician: Natasha Orn, [name] Cardiologist: Dr. Burt [name] Referral provider: Truitt Merle, NP Pulmonologist: Dr. Elsworth [name] GI: Dr. Dolphus Jenny I [name] is a 84 y.o. female with a h/o afib that has been on amiodarone since June of 2015. She had afib 12/2016, in the setting of persistent cough x 6-8 weeks  that had not responded to two rounds of prednisone and antibiotics. She had also been using cough syrup with decongestants. Her Cardizem was increased and  BB was added with good slowing of v rate and with slower heart rate, she does feel better but persists in afib. She is not really wanting a cardioversion. Her biggest concern was  cough. She did have a CXR and other than a large hiatal hernia, there were no other abnormalities noted. TSH had also been mildly elevated but PCP aware and was monitoring. .Continues on eliquis. She did have repair of Hiatal hernia 05/25/17.  She is in the  afib clinic for return to afib in October . She had been trying to wean off amiodarone with concerns re her lungs and her eye doctor could see evidence of amio use, but she states that her vision had not changed. CT chest in March did not show any evidence of lung toxicity for amiodarone. She is pending another eval with lung doctor 11/13.  She increased her amio back to 200 mg a day about 10 days ago.  We discussed options to amiodarone, with either Tikosyn or sotalol and would have to let amio wash out and may take 2-3 months before eligible for the other drugs. She does not think she can tolerate afib for that period of time. We also discussed possible av nodal ablation and PPM but she does not like this option as well. For now she is happy to use the amiodarone. No missed doses of eliquis.She is tolerating the afib.  F/u in the afib clinic 09/24/17. She had successful cardioversion 11/7, but went back into afib this weekend. She  states that it is because her grandson came by 3 different times recently  asking for money and he stressed her out too much, causing her to go back into afib.. She tells me in the past, another time she went into afib  because a daughter asked her for $7000. She does not feel that different in afib, noticed it because of increase in her HR to around 100. She was suppose to f/u with Pulmonologist tomorrow, but moved appointment into December as she felt she would not be able to do PFT's in afib.  F/u in the afib clinic, 11/26, for adjustment in rate control with metoprolol 12.5 mg bid on last visit. She is better rate controlled in the 80's, BP on softer side and will stop hctz. She is saying she would like to try cardioversion one more time. I questioned her closely to see how symptomatic she really is in afib and the only way she knows she is in afib is because of her pulse ox. She does not seem to be functionally compromised with afib. We discussed just staying in afib as an option. Will update echo to make sure EF has not declined.  She is back in afib clinic 11/16/17. She saw Dr. Burt [name] and repeat cardioversion was performed. This was successful and she called to clinic after cardioversion due to HR in the 40's. She was told to reduce metoprolol to 12.5 mg bid. In the office, she continues on 12.5 mg bid and  HR is SR at 46 bpm. She is not symptomatic with this and pt will continue to monitor at home.  F/u Afib clinic, 2/7. She is staying in [place] and feels well.   Today, she denies symptoms of chest pain, shortness of breath, orthopnea, PND, lower extremity edema, dizziness, presyncope, syncope, or neurologic sequela.  The patient is tolerating medications without difficulties and is otherwise without complaint today.   Past Medical History:  Diagnosis Date  . ADENOCARCINOMA, BREAST 10/13/2010   Qualifier: Diagnosis of  By: [name] CMA (AAMA), [name]    . Allergy    SEASONAL  . Anemia    years ago  . Arthritis    "all my joints" (03/31/2014)  . Atrial fibrillation ([name])     . Atrial fibrillation with RVR ([name]) 03/31/2015   DLCO dropped from 100% in 2015-50% in 01/2017  . Barrett's esophagus   . Breast cancer [name])    s/p right breast lumpectomy  . Cervical cancer ([facility])   . DJD (degenerative joint disease) of knee   . Dysrhythmia    Afib  . GERD (gastroesophageal reflux disease)   . Glaucoma   . H/O hiatal hernia   . Heart murmur   . Hx of transesophageal echocardiography (TEE) for monitoring    TEE (03/2014): No LAA clot, moderate LAE, core triatriatum type structure in RA with no stenosis, normal EF 55-60%, mild MR  . Hypertension   . Hypothyroidism, postsurgical   . Migraine    "last one was years ago" (03/31/2014)  . OSA on CPAP    settings at 10-11   . RECTAL FISSURE 10/13/2010   Qualifier: Diagnosis of  By: [name] CMA ([name]), [name]    . Sleep apnea   . Thyroid cancer [name])    s/p thyroidectomy  . Type II diabetes mellitus ([name])    type II    Past Surgical History:  Procedure Laterality Date  . ABDOMINAL HYSTERECTOMY     "partial"  . BREAST BIOPSY Right   . BREAST LUMPECTOMY Right   . CARDIOVERSION N/A 03/30/2014   Procedure: CARDIOVERSION - BEDSIDE;  Surgeon: [name];  Location: [facility];  Service: Cardiovascular;  Laterality: N/A;  . CARDIOVERSION N/A 03/31/2014   Procedure: CARDIOVERSION  (BEDSIDE) ;  Surgeon: Natasha Perla, [name];  Location: [facility];  Service: Cardiovascular;  Laterality: N/A;  . CARDIOVERSION N/A 04/27/2014   Procedure: CARDIOVERSION;  Surgeon: Natasha Coco, [name];  Location: [facility];  Service: Cardiovascular;  Laterality: N/A;  . CARDIOVERSION N/A 04/13/2015   Procedure: CARDIOVERSION;  Surgeon: Natasha Headings, [name];  Location: [facility];  Service: Cardiovascular;  Laterality: N/A;  . CARDIOVERSION N/A 01/08/2017   Procedure: CARDIOVERSION;  Surgeon: Natasha Klein, [name];  Location: [facility] ENDOSCOPY;  Service: Cardiovascular;  Laterality: N/A;  . CARDIOVERSION N/A 09/19/2017   Procedure: CARDIOVERSION;  Surgeon:  [name];  Location: [name];  Service: Cardiovascular;  Laterality: N/A;  . CARDIOVERSION N/A 10/25/2017   Procedure: CARDIOVERSION;  Surgeon: Acie Fredrickson Wonda Cheng, [name];  Location: [facility];  Service: Cardiovascular;  Laterality: N/A;  . CARPAL TUNNEL RELEASE Right   . CATARACT EXTRACTION W/ INTRAOCULAR LENS  IMPLANT, BILATERAL Bilateral   . COLONOSCOPY    . EXCISIONAL HEMORRHOIDECTOMY    . INSERTION OF MESH N/A 05/25/2017   Procedure: INSERTION OF MESH;  Surgeon: Ralene Ok, [name];  Location: [facility];  Service: General;  Laterality: N/A;  . TEE WITHOUT CARDIOVERSION N/A 03/30/2014   Procedure: TRANSESOPHAGEAL ECHOCARDIOGRAM (TEE);  Surgeon: [name];  Location: [facility] ENDOSCOPY;  Service: Cardiovascular;  Laterality: N/A;  . THYROIDECTOMY    . TONSILLECTOMY      Current Outpatient Medications  Medication Sig Dispense Refill  . acetaminophen (TYLENOL) 650 MG CR tablet Take 650 mg by mouth every 8 (eight) hours as needed for pain.     Marland Kitchen amiodarone (PACERONE) 200 MG tablet TAKE 1 TABLET BY MOUTH  DAILY 90 tablet 3  . diltiazem (CARDIZEM CD) 360 MG 24 hr capsule TAKE 1 CAPSULE BY MOUTH  DAILY (Patient taking differently: TAKE 360 MG BY MOUTH  DAILY) 90 capsule 3  . ELIQUIS 5 MG TABS tablet TAKE 1 TABLET BY MOUTH TWO  TIMES DAILY 180 tablet 3  . ferrous sulfate 325 (65 FE) MG tablet Take 325 mg by mouth daily with breakfast.    . fluticasone (FLONASE) 50 MCG/ACT nasal spray Place 1 spray into both nostrils at bedtime.     . lansoprazole (PREVACID) 30 MG capsule Take 1 capsule (30 mg total) by mouth 2 (two) times daily before a meal. (Patient taking differently: Take 30 mg by mouth daily. ) 180 capsule 2  . levothyroxine (SYNTHROID, LEVOTHROID) 175 MCG tablet Take 175 mcg by mouth daily before breakfast.    . LORazepam (ATIVAN) 1 MG tablet Take 1.5 mg by mouth at bedtime.    Marland Kitchen losartan (COZAAR) 25 MG tablet Take 1 tablet (25 mg total) by mouth daily. 90 tablet 3  . Lutein  6 MG CAPS Take 6 mg by mouth daily.     . metFORMIN (GLUMETZA) 500 MG (MOD) 24 hr tablet Take 500 mg by mouth 2 (two) times daily with a meal.     . metoprolol tartrate (LOPRESSOR) 25 MG tablet Take 12.5 mg by mouth 2 (two) times daily.    . Multiple Vitamin (MULTIVITAMIN WITH MINERALS) TABS tablet Take 1 tablet by mouth daily.    . Multiple Vitamins-Minerals (ICAPS AREDS 2) CAPS Take 1 capsule by mouth 2 (two) times daily.     . Polyvinyl Alcohol-Povidone PF (REFRESH) 1.4-0.6 % SOLN Place 1 drop into both eyes at bedtime.    . potassium chloride (KLOR-CON) 20 MEQ packet Take 20 mEq by mouth daily.     . rosuvastatin (CRESTOR) 10 MG tablet Take 10 mg by mouth at bedtime.    . sertraline (ZOLOFT) 50 MG tablet Take 50 mg by mouth daily.    . vitamin B-12 (CYANOCOBALAMIN) 500 MCG tablet Take 500 mcg by mouth daily as needed (energy).     No current facility-administered medications for this encounter.     Allergies  Allergen Reactions  . Penicillins Itching, Rash and Other (See Comments)    Has patient had a PCN reaction causing immediate rash, facial/tongue/throat swelling, SOB or lightheadedness with hypotension: Unknown, childhood reaction Has patient had a PCN reaction causing severe rash involving mucus membranes or skin necrosis: No Has patient had a PCN reaction that required hospitalization No Has patient had a PCN reaction occurring within the last 10 years: No If all of the above answers are "NO", then may proceed with Cephalosporin use.     Social History   Socioeconomic History  . Marital status: Divorced    Spouse name: Not on file  . Number of children: 2  . Years of education: Not on file  . Highest education level: Not on file  Social Needs  . Financial resource strain: Not on file  . Food insecurity - worry: Not on  file  . Food insecurity - inability: Not on file  . Transportation needs - medical: Not on file  . Transportation needs - non-medical: Not on file    Occupational History  . Occupation: Retired  Tobacco Use  . Smoking status: Never Smoker  . Smokeless tobacco: Never Used  Substance and Sexual Activity  . Alcohol use: No    Alcohol/week: 0.0 oz  . Drug use: No  . Sexual activity: No  Other Topics Concern  . Not on file  Social History Narrative  . Not on file    Family History  Problem Relation Age of Onset  . Prostate cancer Father   . Diabetes Sister   . Heart disease Sister   . Heart attack Brother   . Heart disease Brother   . Diabetes Brother   . Diabetes Sister   . Diabetes Sister   . Pancreatic cancer Sister   . Diabetes Brother   . Heart disease Brother   . Diabetes Brother   . Heart disease Brother   . Diabetes Brother   . Heart disease Brother   . Diabetes Brother   . Heart disease Brother   . Diabetes Brother   . Heart disease Brother   . Diabetes Brother     ROS- All systems are reviewed and negative except as per the HPI above  Physical Exam: Vitals:   12/20/17 1403  BP: (!) 110/56  Pulse: (!) 55  Weight: 181 lb (82.1 kg)  Height: 5\' 6"  (1.676 m)   Wt Readings from Last 3 Encounters:  12/20/17 181 lb (82.1 kg)  11/16/17 180 lb 3.2 oz (81.7 kg)  11/15/17 179 lb 3.2 oz (81.3 kg)    Labs: Lab Results  Component Value Date   NA 143 10/18/2017   K 4.6 10/18/2017   CL 105 10/18/2017   CO2 25 10/18/2017   GLUCOSE 123 (H) 10/18/2017   BUN 19 10/18/2017   CREATININE 0.88 10/18/2017   CALCIUM 9.4 10/18/2017   MG 2.2 06/07/2017   Lab Results  Component Value Date   INR 1.12 04/20/2016   No results found for: CHOL, HDL, LDLCALC, TRIG   GEN- The patient is well appearing, alert and oriented x 3 today.   Head- normocephalic, atraumatic Eyes-  Sclera clear, conjunctiva pink Ears- hearing intact Oropharynx- clear Neck- supple, no JVP Lymph- no cervical lymphadenopathy Lungs- Clear to ausculation bilaterally, normal work of breathing Heart-  regular rate and rhythm, no murmurs, rubs  or gallops, PMI not laterally displaced GI- soft, NT, ND, + BS Extremities- no clubbing, cyanosis, or edema MS- no significant deformity or atrophy Skin- no rash or lesion Psych- euthymic mood, full affect Neuro- strength and sensation are intact  EKG- Sinus brady at 55 bpm, pr int 184 ms, qrs int 94 ms, qtc 463 ms Epic records reviewed Echo-Study Conclusions  - Left ventricle: Wall thickness was increased in a pattern of   moderate LVH. There was severe focal basal hypertrophy of the   septum - measuring 2.0 cm, consistent with possible hypertrophic   cardimyopathy. Systolic function was mildly to moderately   reduced. The estimated ejection fraction was in the range of 40%   to 45%. Diffuse hypokinesis. The study is not technically   sufficient to allow evaluation of LV diastolic function. LV   filling pressure appreas elevated. Ejection fraction (MOD,   2-plane): 41%. - Aortic valve: Trileaflet. Sclerosis without stenosis. There was   no regurgitation. - Mitral valve: Calcified annulus.  Mildly thickened leaflets .   There was mild regurgitation. Valve area by continuity equation   (using LVOT flow): 1.77 cm^2. - Left atrium: Severely dilated. - Right ventricle: The cavity size was mildly dilated. Mildly   reduced systolic function. - Tricuspid valve: There was moderate regurgitation. - Pulmonary arteries: PA peak pressure: 33 mm Hg (S). - Inferior vena cava: The vessel was normal in size. The   respirophasic diameter changes were in the normal range (>= 50%),   consistent with normal central venous pressure.  Impressions:  - Compared to a prior echo in 2016, a-fib is present. The LVEF is   lower at 40-45% with global hypokinesis, there is moderate LVH   with severe proximal septal thickening - possibly suggesting   hypertrophic cardiomyopathy, LV filling pressure is elevated,   there is mild MR, severe LAE, mild RVE with reduced RV systolic   function, moderate TR,  RVSP of 33 mmHg, with a normal IVC.     Assessment and Plan: 1. Persistent afib Successful cardioversion 10/25/17 Continue amiodarone  200 mg a day Continue metoprolol tartrate 25 mg 1/2 tab bid  Continue cardizem at 360 mg a day Continue eliquis 5 mg bid for chadsvasc score of at least 5, states no missed doses  2. Bradycardia Is not symptomatic and will continue to watch HR at home I feel that if BB is d/c she will likely go back into afib She is aware to report any HR's below 40 bpm or if she becomes lightheaded or presyncopal, short of breath   F/u with Dr. Burt [name] as scheduled  afib clinic as needed   [name], [facility] [address] [place], [place] [address] [phone]

## 2018-01-01 ENCOUNTER — Telehealth: Payer: Self-pay | Admitting: Pulmonary Disease

## 2018-01-01 ENCOUNTER — Ambulatory Visit: Payer: Medicare Other | Admitting: Pulmonary Disease

## 2018-01-01 NOTE — Telephone Encounter (Signed)
I did offer pt an appointment w. NP's she only wanted to see and talk to RA.[name]

## 2018-01-01 NOTE — Telephone Encounter (Signed)
Called pt but there was a busy signal X 3. Will try again later.

## 2018-01-02 ENCOUNTER — Ambulatory Visit: Payer: Medicare Other | Admitting: Pulmonary Disease

## 2018-01-02 NOTE — Telephone Encounter (Signed)
Attempted to contact pt. No answer, no option to leave a message. Will try back.  

## 2018-01-03 NOTE — Telephone Encounter (Signed)
Attempted to contact pt. No answer, no option to leave a message. Will try back.  

## 2018-01-04 NOTE — Telephone Encounter (Signed)
We have attempted to contact the pt several times with no success or call back from the pt. Per triage protocol, message will be closed.  

## 2018-01-09 ENCOUNTER — Ambulatory Visit (INDEPENDENT_AMBULATORY_CARE_PROVIDER_SITE_OTHER): Payer: Medicare Other | Admitting: Pulmonary Disease

## 2018-01-09 DIAGNOSIS — R05 Cough: Secondary | ICD-10-CM | POA: Diagnosis not present

## 2018-01-09 DIAGNOSIS — R053 Chronic cough: Secondary | ICD-10-CM

## 2018-01-09 LAB — PULMONARY FUNCTION TEST
DL/VA % PRED: 73 %
DL/VA: 3.68 ml/min/mmHg/L
DLCO COR % PRED: 65 %
DLCO COR: 17.54 ml/min/mmHg
DLCO UNC % PRED: 66 %
DLCO unc: 17.8 ml/min/mmHg
FEF 25-75 Pre: 1.55 L/sec
FEF2575-%Pred-Pre: 120 %
FEV1-%Pred-Pre: 105 %
FEV1-Pre: 2.06 L
FEV1FVC-%PRED-PRE: 99 %
FEV6-%Pred-Pre: 112 %
FEV6-Pre: 2.79 L
FEV6FVC-%Pred-Pre: 106 %
FVC-%Pred-Pre: 107 %
FVC-Pre: 2.84 L
PRE FEV6/FVC RATIO: 100 %
Pre FEV1/FVC ratio: 73 %

## 2018-01-09 NOTE — Progress Notes (Signed)
PFT before and DLCO completed today 01/09/18

## 2018-01-11 ENCOUNTER — Ambulatory Visit: Payer: Medicare Other | Admitting: Pulmonary Disease

## 2018-01-11 ENCOUNTER — Encounter: Payer: Self-pay | Admitting: Pulmonary Disease

## 2018-01-11 DIAGNOSIS — R05 Cough: Secondary | ICD-10-CM | POA: Diagnosis not present

## 2018-01-11 DIAGNOSIS — R053 Chronic cough: Secondary | ICD-10-CM

## 2018-01-11 DIAGNOSIS — I4819 Other persistent atrial fibrillation: Secondary | ICD-10-CM

## 2018-01-11 DIAGNOSIS — I481 Persistent atrial fibrillation: Secondary | ICD-10-CM

## 2018-01-11 NOTE — Progress Notes (Signed)
   Subjective:    Patient ID: [name], female    DOB: [birth year], 84 y.o.   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  She underwent a robotic Nissen's fundoplication 0/8811 and her symptoms improved for a while. Cough has now returned.  She reports production of minimum yellow sputum especially after she eats something.  I reviewed an esophagram from 05/2017 which shows a dilated esophagus immediately post surgery and follow-up was recommended.  She denies dysphagia to solids or liquids  She remains on Prilosec twice daily and denies breakthrough reflux symptoms   she underwent DC CV for atrial fibrillation in December 2018 and remains on amiodarone PFT showed drop in DLCO compared to 2015 but CT chest did not show any evidence of interstitial lung disease  PFTs 12/2017 were reviewed which shows ratio of 73, FEV1 of 105%, DLCO 66% but corrects to 73% for alveolar volume   Significant tests/ events reviewed 01/2017 FEV1 88%, ratio 72, FVC 91%, total lung capacity 96%, DLCO 52%. DLCO has dropped considerably since 2015 (was 100%)   01/2017 HRCT chest - no ILD , Large hiatal hernia with compressive atelectasis in the medial right lower lobe  Review of Systems Patient denies significant dyspnea,cough, hemoptysis,  chest pain, palpitations, pedal edema, orthopnea, paroxysmal nocturnal dyspnea, lightheadedness, nausea, vomiting, abdominal or  leg pains      Objective:   Physical Exam  Gen. Pleasant, well-nourished, in no distress ENT - no thrush, no post nasal drip Neck: No JVD, no thyromegaly, no carotid bruits Lungs: no use of accessory muscles, no dullness to percussion, clear without rales or rhonchi  Cardiovascular: Rhythm regular, heart sounds  normal,  no murmurs or gallops, no peripheral edema Musculoskeletal: No deformities, no cyanosis or clubbing        Assessment & Plan:

## 2018-01-11 NOTE — Assessment & Plan Note (Signed)
DLCO is improved compared to 2018 but still lower compared to 2015, doubt amiodarone toxicity okay to continue for now especially since she absolutely seems to need this for atrial fibrillation

## 2018-01-11 NOTE — Assessment & Plan Note (Signed)
cough has returned and may still be due to residual reflux, also possibly nonacid related reflux  We will hold off on further testing at this time but if this persists beyond 3 months then we will consider repeating esophagram to look for achalasia  Continue Delsym for symptomatic relief meantime

## 2018-01-11 NOTE — Patient Instructions (Signed)
Use Delsym 5 mL twice daily as needed for cough.

## 2018-01-16 ENCOUNTER — Ambulatory Visit: Payer: Medicare Other | Admitting: Pulmonary Disease

## 2018-01-31 ENCOUNTER — Other Ambulatory Visit (HOSPITAL_COMMUNITY): Payer: Self-pay | Admitting: *Deleted

## 2018-01-31 MED ORDER — METOPROLOL TARTRATE 25 MG PO TABS: 12.5000 mg | ORAL_TABLET | Freq: Two times a day (BID) | ORAL | 2 refills | Status: DC

## 2018-03-04 ENCOUNTER — Other Ambulatory Visit: Payer: Self-pay | Admitting: Cardiovascular Disease

## 2018-04-19 ENCOUNTER — Ambulatory Visit: Payer: Medicare Other | Admitting: Adult Health

## 2018-04-22 ENCOUNTER — Ambulatory Visit: Payer: Medicare Other | Admitting: Cardiovascular Disease

## 2018-04-22 ENCOUNTER — Encounter (INDEPENDENT_AMBULATORY_CARE_PROVIDER_SITE_OTHER): Payer: Self-pay

## 2018-04-22 ENCOUNTER — Encounter: Payer: Self-pay | Admitting: Cardiovascular Disease

## 2018-04-22 VITALS — BP 122/78 | HR 55 | Ht 66.0 in | Wt 187.5 lb

## 2018-04-22 DIAGNOSIS — I480 Paroxysmal atrial fibrillation: Secondary | ICD-10-CM

## 2018-04-22 DIAGNOSIS — I5022 Chronic systolic (congestive) heart failure: Secondary | ICD-10-CM | POA: Diagnosis not present

## 2018-04-22 NOTE — Progress Notes (Signed)
Cardiology Office Note Date:  04/22/2018   ID:  [name], DOB 1931-07-21, MRN [id number]  PCP:  Lavone Orn, MD  Cardiologist:  Sherren Mocha, MD    Chief Complaint  Patient presents with  . Follow-up    atrial fibrillation     History of Present Illness: [name] is a 84 y.o. female who presents for follow-up of persistent atrial fibrillation and chronic systolic heart failure.  She has undergone multiple cardioversions in the past.  She has had recurrent atrial fibrillation when reducing her amiodarone to 100 mg daily.  Her most recent cardioversion in December 2018 was successful and she has maintained sinus rhythm since that time.  Prior to that her LV function had become compromised with an LVEF of approximately 40%.  He does have chronic shortness of breath and cough.  She is anticoagulated with apixaban without bleeding problems.  Her rhythm and heart rate are controlled with a combination of amiodarone, diltiazem, and low-dose metoprolol.  The patient is here alone today.  She is feeling pretty well.  She does complain of phlegm but does not admit to a typical cough.  No recent shortness of breath or chest pain.  Admits to mild leg swelling.  No orthopnea or PND.  No recent heart palpitations.   Past Medical History:  Diagnosis Date  . ADENOCARCINOMA, BREAST 10/13/2010   Qualifier: Diagnosis of  By: Nils Pyle CMA (AAMA), Mearl Latin    . Allergy    SEASONAL  . Anemia    years ago  . Arthritis    "all my joints" (03/31/2014)  . Atrial fibrillation ([place])   . Atrial fibrillation with RVR ([place]) 03/31/2015   DLCO dropped from 100% in 2015-50% in 01/2017  . Barrett's esophagus   . Breast cancer [facility])    s/p right breast lumpectomy  . Cervical cancer ([place])   . DJD (degenerative joint disease) of knee   . Dysrhythmia    Afib  . GERD (gastroesophageal reflux disease)   . Glaucoma   . H/O hiatal hernia   . Heart murmur   . Hx of transesophageal echocardiography (TEE)  for monitoring    TEE (03/2014): No LAA clot, moderate LAE, core triatriatum type structure in RA with no stenosis, normal EF 55-60%, mild MR  . Hypertension   . Hypothyroidism, postsurgical   . Migraine    "last one was years ago" (03/31/2014)  . OSA on CPAP    settings at 10-11   . RECTAL FISSURE 10/13/2010   Qualifier: Diagnosis of  By: Nils Pyle CMA ([place]), Mearl Latin    . Sleep apnea   . Thyroid cancer [facility])    s/p thyroidectomy  . Type II diabetes mellitus ([place])    type II     Past Surgical History:  Procedure Laterality Date  . ABDOMINAL HYSTERECTOMY     "partial"  . BREAST BIOPSY Right   . BREAST LUMPECTOMY Right   . CARDIOVERSION N/A 03/30/2014   Procedure: CARDIOVERSION - BEDSIDE;  Surgeon: [name], MD;  Location: [place];  Service: Cardiovascular;  Laterality: N/A;  . CARDIOVERSION N/A 03/31/2014   Procedure: CARDIOVERSION  (BEDSIDE) ;  Surgeon: Lelon Perla, MD;  Location: [place];  Service: Cardiovascular;  Laterality: N/A;  . CARDIOVERSION N/A 04/27/2014   Procedure: CARDIOVERSION;  Surgeon: Darlin Coco, MD;  Location: [facility] ENDOSCOPY;  Service: Cardiovascular;  Laterality: N/A;  . CARDIOVERSION N/A 04/13/2015   Procedure: CARDIOVERSION;  Surgeon: Thayer Headings, MD;  Location: [facility]  ENDOSCOPY;  Service: Cardiovascular;  Laterality: N/A;  . CARDIOVERSION N/A 01/08/2017   Procedure: CARDIOVERSION;  Surgeon: Sanda Klein, MD;  Location: [place] ENDOSCOPY;  Service: Cardiovascular;  Laterality: N/A;  . CARDIOVERSION N/A 09/19/2017   Procedure: CARDIOVERSION;  Surgeon: [name], MD;  Location: [facility] ENDOSCOPY;  Service: Cardiovascular;  Laterality: N/A;  . CARDIOVERSION N/A 10/25/2017   Procedure: CARDIOVERSION;  Surgeon: Thayer Headings, MD;  Location: [place];  Service: Cardiovascular;  Laterality: N/A;  . CARPAL TUNNEL RELEASE Right   . CATARACT EXTRACTION W/ INTRAOCULAR LENS  IMPLANT, BILATERAL Bilateral   . COLONOSCOPY    . EXCISIONAL HEMORRHOIDECTOMY    .  INSERTION OF MESH N/A 05/25/2017   Procedure: INSERTION OF MESH;  Surgeon: Ralene Ok, MD;  Location: [facility];  Service: General;  Laterality: N/A;  . TEE WITHOUT CARDIOVERSION N/A 03/30/2014   Procedure: TRANSESOPHAGEAL ECHOCARDIOGRAM (TEE);  Surgeon: [name], MD;  Location: [facility] ENDOSCOPY;  Service: Cardiovascular;  Laterality: N/A;  . THYROIDECTOMY    . TONSILLECTOMY      Current Outpatient Medications  Medication Sig Dispense Refill  . acetaminophen (TYLENOL) 650 MG CR tablet Take 650 mg by mouth every 8 (eight) hours as needed for pain.     Marland Kitchen amiodarone (PACERONE) 200 MG tablet TAKE 1 TABLET BY MOUTH  DAILY 90 tablet 3  . diltiazem (CARDIZEM CD) 360 MG 24 hr capsule Take 1 capsule (360 mg total) by mouth daily. 90 capsule 1  . ELIQUIS 5 MG TABS tablet TAKE 1 TABLET BY MOUTH TWO  TIMES DAILY 180 tablet 3  . ferrous sulfate 325 (65 FE) MG tablet Take 325 mg by mouth daily with breakfast.    . fluticasone (FLONASE) 50 MCG/ACT nasal spray Place 1 spray into both nostrils at bedtime.     . lansoprazole (PREVACID) 30 MG capsule Take 30 mg by mouth as directed.    Marland Kitchen levothyroxine (SYNTHROID, LEVOTHROID) 175 MCG tablet Take 175 mcg by mouth daily before breakfast.    . LORazepam (ATIVAN) 1 MG tablet Take 1.5 mg by mouth at bedtime.    Marland Kitchen losartan (COZAAR) 25 MG tablet Take 1 tablet (25 mg total) by mouth daily. 90 tablet 3  . Lutein 6 MG CAPS Take 6 mg by mouth daily.     . metFORMIN (GLUMETZA) 500 MG (MOD) 24 hr tablet Take 500 mg by mouth 2 (two) times daily with a meal.     . metoprolol tartrate (LOPRESSOR) 25 MG tablet Take 0.5 tablets (12.5 mg total) by mouth 2 (two) times daily. 90 tablet 2  . Multiple Vitamins-Minerals (ICAPS AREDS 2) CAPS Take 1 capsule by mouth 2 (two) times daily.     . Polyvinyl Alcohol-Povidone PF (REFRESH) 1.4-0.6 % SOLN Place 1 drop into both eyes at bedtime.    . potassium chloride (KLOR-CON) 20 MEQ packet Take 20 mEq by mouth daily.     . rosuvastatin  (CRESTOR) 10 MG tablet Take 10 mg by mouth at bedtime.    . sertraline (ZOLOFT) 50 MG tablet Take 50 mg by mouth daily.    . vitamin B-12 (CYANOCOBALAMIN) 500 MCG tablet Take 500 mcg by mouth daily as needed (energy).     No current facility-administered medications for this visit.     Allergies:   Penicillins   Social History:  The patient  reports that she has never smoked. She has never used smokeless tobacco. She reports that she does not drink alcohol or use drugs.   Family  History:  The patient's family history includes Diabetes in her brother, brother, brother, brother, brother, brother, brother, sister, sister, and sister; Heart attack in her brother; Heart disease in her brother, brother, brother, brother, brother, brother, and sister; Pancreatic cancer in her sister; Prostate cancer in her father.    ROS:  Please see the history of present illness.  All other systems are reviewed and negative.    PHYSICAL EXAM: VS:  BP 122/78   Pulse (!) 55   Ht 5\' 6"  (1.676 m)   Wt 187 lb 8 oz (85 kg)   LMP  (LMP Unknown)   SpO2 95%   BMI 30.26 kg/m  , BMI Body mass index is 30.26 kg/m. GEN: Well nourished, well developed, in no acute distress  HEENT: normal  Neck: no JVD, no masses. No carotid bruits Cardiac: RRR with 2/6 systolic murmur at the LLSB             Respiratory:  , diminished in the bases, otherwise clear to auscultation bilaterally, normal work of breathing GI: soft, nontender, nondistended, + BS MS: no deformity or atrophy  Ext: 1+ bilateral pretibial edema Skin: warm and dry, no rash Neuro:  Strength and sensation are intact Psych: euthymic mood, full affect  EKG:  EKG is not ordered today.  Recent Labs: 06/07/2017: Magnesium 2.2 09/05/2017: ALT 15; TSH 2.630 10/18/2017: BUN 19; Creatinine, Ser 0.88; Hemoglobin 13.9; Platelets 186; Potassium 4.6; Sodium 143   Lipid Panel  No results found for: CHOL, TRIG, HDL, CHOLHDL, VLDL, LDLCALC, LDLDIRECT    Wt Readings  from Last 3 Encounters:  04/22/18 187 lb 8 oz (85 kg)  01/11/18 185 lb 3.2 oz (84 kg)  12/20/17 181 lb (82.1 kg)     Cardiac Studies Reviewed: Echo 10-11-2017: Study Conclusions  - Left ventricle: Wall thickness was increased in a pattern of   moderate LVH. There was severe focal basal hypertrophy of the   septum - measuring 2.0 cm, consistent with possible hypertrophic   cardimyopathy. Systolic function was mildly to moderately   reduced. The estimated ejection fraction was in the range of 40%   to 45%. Diffuse hypokinesis. The study is not technically   sufficient to allow evaluation of LV diastolic function. LV   filling pressure appreas elevated. Ejection fraction (MOD,   2-plane): 41%. - Aortic valve: Trileaflet. Sclerosis without stenosis. There was   no regurgitation. - Mitral valve: Calcified annulus. Mildly thickened leaflets .   There was mild regurgitation. Valve area by continuity equation   (using LVOT flow): 1.77 cm^2. - Left atrium: Severely dilated. - Right ventricle: The cavity size was mildly dilated. Mildly   reduced systolic function. - Tricuspid valve: There was moderate regurgitation. - Pulmonary arteries: PA peak pressure: 33 mm Hg (S). - Inferior vena cava: The vessel was normal in size. The   respirophasic diameter changes were in the normal range (>= 50%),   consistent with normal central venous pressure.  Impressions:  - Compared to a prior echo in 2016, a-fib is present. The LVEF is   lower at 40-45% with global hypokinesis, there is moderate LVH   with severe proximal septal thickening - possibly suggesting   hypertrophic cardiomyopathy, LV filling pressure is elevated,   there is mild MR, severe LAE, mild RVE with reduced RV systolic   function, moderate TR, RVSP of 33 mmHg, with a normal IVC.   ASSESSMENT AND PLAN: 1.  Chronic systolic heart failure: NYHA functional class II symptoms.  I  suspect her cardiomyopathy and LV dysfunction are  related to atrial fibrillation.  It appears that she is been maintaining sinus rhythm and I would like to update her echocardiogram.  She continues on losartan and metoprolol.  She is not currently requiring any diuretic therapy.  2.  Persistent atrial fibrillation: Heart rhythm is controlled on amiodarone, diltiazem, and metoprolol.  She is anticoagulated with apixaban.  Will update amiodarone monitoring labs today.  3.  Hypertension: Blood pressure is well controlled on multidrug therapy.  4. Chronic use of an anticoagulant drug: reviewed entire medicine list. Discussed bleeding risk. Overall appears to be tolerating well.  Current medicines are reviewed with the patient today.  The patient does not have concerns regarding medicines.  Labs/ tests ordered today include:   Orders Placed This Encounter  Procedures  . Comprehensive metabolic panel  . TSH  . ECHOCARDIOGRAM COMPLETE    Disposition:   FU 4 months APP  Signed, Sherren Mocha, MD  04/22/2018 3:36 PM    [facility] [place], [place], [place]  [address] Phone: [phone]; Fax: [phone]

## 2018-04-22 NOTE — Patient Instructions (Addendum)
Medication Instructions:  Your provider recommends that you continue on your current medications as directed. Please refer to the Current Medication list given to you today.    Labwork: TODAY: CMET, TSH  Testing/Procedures: Your provider has requested that you have an echocardiogram. You are scheduled for your ECHO on 04/24/2018. Please arrive by 2:30PM for your appointment.Echocardiography is a painless test that uses sound waves to create images of your heart. It provides your doctor with information about the size and shape of your heart and how well your heart's chambers and valves are working. This procedure takes approximately one hour. There are no restrictions for this procedure.  Follow-Up: Your provider wants you to follow-up in: 4 months with Richardson Dopp, PA. You will receive a reminder letter in the mail two months in advance. If you don't receive a letter, please call our office to schedule the follow-up appointment.  You are scheduled with Richardson Dopp, PA, on Monday, August 26, 2018 at 2:15PM.  Any Other Special Instructions Will Be Listed Below (If Applicable).     If you need a refill on your cardiac medications before your next appointment, please call your pharmacy.

## 2018-04-23 ENCOUNTER — Ambulatory Visit: Payer: Medicare Other | Admitting: Pulmonary Disease

## 2018-04-23 ENCOUNTER — Ambulatory Visit (INDEPENDENT_AMBULATORY_CARE_PROVIDER_SITE_OTHER)
Admission: RE | Admit: 2018-04-23 | Discharge: 2018-04-23 | Disposition: A | Discharge status: Home / Self Care | Payer: Medicare Other | Source: Ambulatory Visit | Attending: Pulmonary Disease | Admitting: Pulmonary Disease

## 2018-04-23 ENCOUNTER — Encounter: Payer: Self-pay | Admitting: Pulmonary Disease

## 2018-04-23 DIAGNOSIS — R053 Chronic cough: Secondary | ICD-10-CM

## 2018-04-23 DIAGNOSIS — R05 Cough: Secondary | ICD-10-CM | POA: Diagnosis not present

## 2018-04-23 LAB — COMPREHENSIVE METABOLIC PANEL
ALBUMIN: 4.1 g/dL (ref 3.5–4.7)
ALT: 11 IU/L (ref 0–32)
AST: 14 IU/L (ref 0–40)
Albumin/Globulin Ratio: 2 (ref 1.2–2.2)
Alkaline Phosphatase: 77 IU/L (ref 39–117)
BILIRUBIN TOTAL: 0.2 mg/dL (ref 0.0–1.2)
BUN/Creatinine Ratio: 22 (ref 12–28)
BUN: 21 mg/dL (ref 8–27)
CHLORIDE: 105 mmol/L (ref 96–106)
CO2: 21 mmol/L (ref 20–29)
Calcium: 9.4 mg/dL (ref 8.7–10.3)
Creatinine, Ser: 0.94 mg/dL (ref 0.57–1.00)
GFR calc non Af Amer: 55 mL/min/{1.73_m2} — ABNORMAL LOW (ref >59)
GFR, EST AFRICAN AMERICAN: 64 mL/min/{1.73_m2} (ref >59)
GLUCOSE: 107 mg/dL — AB (ref 65–99)
Globulin, Total: 2.1 g/dL (ref 1.5–4.5)
Potassium: 4.2 mmol/L (ref 3.5–5.2)
Sodium: 142 mmol/L (ref 134–144)
TOTAL PROTEIN: 6.2 g/dL (ref 6.0–8.5)

## 2018-04-23 LAB — TSH: TSH: 0.58 u[IU]/mL (ref 0.450–4.500)

## 2018-04-23 NOTE — Patient Instructions (Signed)
Cough may be related to reflux. Chest x-ray today.  Schedule esophagram

## 2018-04-23 NOTE — Addendum Note (Signed)
Addended by: Valerie Salts on: 04/23/2018 10:56 AM   Modules accepted: Orders

## 2018-04-23 NOTE — Progress Notes (Signed)
   Subjective:    Patient ID: [name], female    DOB: 1931/02/04, 84 y.o.   MRN: [id number]  HPI  yo never smoker, retired Chief Executive Officer chronicpersistent coughsince jan 2018 She has chronic atrial fibrillation and is on amiodarone since 2016 She had a large  hiatal hernia s/p robotic Nissen's fundoplication 07/4800 and her symptoms improved for a while. Cough then  returned.  She reports production of minimum yellow sputum especially after she eats something. Cough is persistent but more of a nuisance. She denies dyspnea.  She remains on amiodarone and metoprolol and Cardizem for control of atrial fibrillation Chest x-ray 11/2017 did not show any infiltrates     Significant tests/ events reviewed 3/2018FEV1 88%, ratio 72, FVC 91%, total lung capacity 96%, DLCO 52%. DLCO has dropped considerably since 2015 (was 100%)  PFTs 12/2017 - ratio of 73, FEV1 of 105%, DLCO 66% but corrects to 73% for alveolar volume  01/2017 HRCT chest - no ILD ,Large hiatal hernia with compressive atelectasis in the medial right lower lobe   Review of Systems Patient denies significant dyspnea,cough, hemoptysis,  chest pain, palpitations, pedal edema, orthopnea, paroxysmal nocturnal dyspnea, lightheadedness, nausea, vomiting, abdominal or  leg pains      Objective:   Physical Exam  Gen. Pleasant, well-nourished, in no distress ENT - no thrush, no post nasal drip Neck: No JVD, no thyromegaly, no carotid bruits Lungs: no use of accessory muscles, no dullness to percussion, clear without rales or rhonchi  Cardiovascular: Rhythm regular, heart sounds  normal, no murmurs or gallops, no peripheral edema Musculoskeletal: No deformities, no cyanosis or clubbing        Assessment & Plan:

## 2018-04-23 NOTE — Assessment & Plan Note (Signed)
Lung function is stable so I do not think this is related to amiodarone. This is most likely related to reflux in spite of fundoplication.  We will obtain esophagram with water-soluble contrast to clarify but doubt that we can do much about this

## 2018-04-24 ENCOUNTER — Other Ambulatory Visit: Payer: Self-pay

## 2018-04-24 ENCOUNTER — Ambulatory Visit (HOSPITAL_COMMUNITY): Payer: Medicare Other | Attending: Cardiology

## 2018-04-24 DIAGNOSIS — R011 Cardiac murmur, unspecified: Secondary | ICD-10-CM | POA: Diagnosis not present

## 2018-04-24 DIAGNOSIS — I110 Hypertensive heart disease with heart failure: Secondary | ICD-10-CM | POA: Diagnosis not present

## 2018-04-24 DIAGNOSIS — G4733 Obstructive sleep apnea (adult) (pediatric): Secondary | ICD-10-CM | POA: Insufficient documentation

## 2018-04-24 DIAGNOSIS — I5022 Chronic systolic (congestive) heart failure: Secondary | ICD-10-CM | POA: Diagnosis present

## 2018-04-24 DIAGNOSIS — I480 Paroxysmal atrial fibrillation: Secondary | ICD-10-CM

## 2018-04-24 DIAGNOSIS — E119 Type 2 diabetes mellitus without complications: Secondary | ICD-10-CM | POA: Insufficient documentation

## 2018-04-25 ENCOUNTER — Ambulatory Visit (HOSPITAL_COMMUNITY): Payer: Medicare Other

## 2018-04-30 ENCOUNTER — Ambulatory Visit (HOSPITAL_COMMUNITY)
Admission: RE | Admit: 2018-04-30 | Discharge: 2018-04-30 | Disposition: A | Discharge status: Home / Self Care | Payer: Medicare Other | Source: Ambulatory Visit | Attending: Pulmonary Disease | Admitting: Pulmonary Disease

## 2018-04-30 DIAGNOSIS — R053 Chronic cough: Secondary | ICD-10-CM

## 2018-04-30 DIAGNOSIS — K228 Other specified diseases of esophagus: Secondary | ICD-10-CM | POA: Insufficient documentation

## 2018-04-30 DIAGNOSIS — R05 Cough: Secondary | ICD-10-CM | POA: Insufficient documentation

## 2018-06-04 ENCOUNTER — Other Ambulatory Visit: Payer: Self-pay | Admitting: Cardiovascular Disease

## 2018-06-06 ENCOUNTER — Other Ambulatory Visit: Payer: Self-pay | Admitting: Cardiology

## 2018-08-12 ENCOUNTER — Ambulatory Visit (INDEPENDENT_AMBULATORY_CARE_PROVIDER_SITE_OTHER): Payer: Self-pay

## 2018-08-12 ENCOUNTER — Ambulatory Visit (INDEPENDENT_AMBULATORY_CARE_PROVIDER_SITE_OTHER): Payer: Medicare Other | Admitting: Family Medicine

## 2018-08-12 ENCOUNTER — Encounter (INDEPENDENT_AMBULATORY_CARE_PROVIDER_SITE_OTHER): Payer: Self-pay | Admitting: Family Medicine

## 2018-08-12 DIAGNOSIS — M25562 Pain in left knee: Secondary | ICD-10-CM

## 2018-08-12 DIAGNOSIS — M545 Low back pain, unspecified: Secondary | ICD-10-CM

## 2018-08-12 NOTE — Progress Notes (Signed)
Office Visit Note   Patient: [name]           Date of Birth: May 22, 1931           MRN: [id number] Visit Date: 08/12/2018 Requested by: Lavone Orn, MD [address] [place], [place] [address] PCP: Lavone Orn, MD  Subjective: Chief Complaint  Patient presents with  . Left Knee - Pain    Patient states that she fell 2 1/2 weeks ago. Back Pain, hurts to walk Left knee is swollen, hurts to walk on left knee Can't bend left knee at times  . Lower Back - Pain    HPI: She is an 84 year old with low back and left knee pain.  About 2 weeks ago she was getting out of the car, lost her balance and fell sideways injuring her low back and left knee.  Able to get up and walk, feeling steadily better but still having some soreness in her back in her knee.  Lumbar fusion a couple years ago and has done well since then.  Denies any radicular symptoms.  She has a history of bilateral knee arthritis and has done well with Visco supplementation.  She has not needed any injections in a while.              ROS: She has cardiac rhythm problems, diabetes and multiple other medical conditions.  Things are well controlled right now.  Objective: Vital Signs: LMP  (LMP Unknown)   Physical Exam:  Low back: No significant tenderness along the lumbar spinous processes.  Slight tenderness in the left-sided lumbosacral paraspinous muscles.  No pain in the sciatic notch.  Lower extremity strength and reflexes are normal. Left knee: No effusion, no bruising.  Full active extension, flexion of 125 degrees.  Ligaments feel stable.  Mild tenderness on the medial and lateral joint lines.  Imaging: 2 view lumbar spine x-rays: Surgical hardware looks intact, no sign of loosening or hardware fracture.  No compression fractures seen.  3 view left knee x-rays: Moderate to severe tricompartmental arthritis with no sign of fracture.   Assessment & Plan: 1.  Low back pain status post fall,  surgical hardware looks intact.  Clinically improving. -Reassurance, follow-up as needed.  2.  Left knee pain status post fall, clinically improving -Follow-up as needed.  Hyaluronic acid injections if symptoms worsen again.     Follow-Up Instructions: No follow-ups on file.       Procedures: None today.   PMFS History: Patient Active Problem List   Diagnosis Date Noted  . Acute bronchitis 11/15/2017  . S/P Nissen fundoplication (without gastrostomy tube) procedure 05/25/2017  . Persistent atrial fibrillation ([place])   . Chronic cough 01/02/2017  . Lumbar stenosis with neurogenic claudication 04/20/2016  . 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 Malignant neoplasm of breast ([place]) 02/25/2016  . 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 Post-surgical hypothyroidism 04/09/2014  . Anticoagulated by anticoagulation treatment - Eliquis 03/30/2014  . Diabetes mellitus without complication ([place])   . PAF- s/p DCCV  03/28/2014  . Heart murmur   . DJD (degenerative joint disease) of knee   . Glaucoma   . History of laser assisted in situ keratomileusis 07/25/2012  . Degenerative disorder of eye 07/25/2012  . Exotropia, intermittent 07/09/2012  . 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 Error, refractive, myopia 07/09/2012  . ANEMIA, IRON DEFICIENCY 10/14/2010  . GERD 10/14/2010  . THYROID CANCER 10/13/2010  . HYPERCHOLESTEROLEMIA 10/13/2010  . ANEMIA, UNSPECIFIED 10/13/2010  .  Anxiety state 10/13/2010  . MIGRAINE HEADACHE 10/13/2010  . Essential hypertension 10/13/2010  . EROSIVE ESOPHAGITIS 10/13/2010  . BARRETTS ESOPHAGUS 10/13/2010  . Hiatal hernia s/p robotic repair & fundoplication 5/64/3329 51/88/4166  . DIVERTICULOSIS, COLON 10/13/2010  . Irritable bowel syndrome 10/13/2010  . OSTEOARTHRITIS 10/13/2010  . CARDIAC MURMUR 10/13/2010  . COLONIC POLYPS, ADENOMATOUS, HX OF 10/13/2010   Past Medical History:  Diagnosis Date  . ADENOCARCINOMA, BREAST 10/13/2010    Qualifier: Diagnosis of  By: Nils Pyle CMA (AAMA), Mearl Latin    . Allergy    SEASONAL  . Anemia    years ago  . Arthritis    "all my joints" (03/31/2014)  . Atrial fibrillation ([place])   . Atrial fibrillation with RVR ([place]) 03/31/2015   DLCO dropped from 100% in 2015-50% in 01/2017  . Barrett's esophagus   . Breast cancer [facility])    s/p right breast lumpectomy  . Cervical cancer ([place])   . DJD (degenerative joint disease) of knee   . Dysrhythmia    Afib  . GERD (gastroesophageal reflux disease)   . Glaucoma   . H/O hiatal hernia   . Heart murmur   . Hx of transesophageal echocardiography (TEE) for monitoring    TEE (03/2014): No LAA clot, moderate LAE, core triatriatum type structure in RA with no stenosis, normal EF 55-60%, mild MR  . Hypertension   . Hypothyroidism, postsurgical   . Migraine    "last one was years ago" (03/31/2014)  . OSA on CPAP    settings at 10-11   . RECTAL FISSURE 10/13/2010   Qualifier: Diagnosis of  By: Nils Pyle CMA ([place]), Mearl Latin    . Sleep apnea   . Thyroid cancer [facility])    s/p thyroidectomy  . Type II diabetes mellitus ([place])    type II     Family History  Problem Relation Age of Onset  . Prostate cancer Father   . Diabetes Sister   . Heart disease Sister   . Heart attack Brother   . Heart disease Brother   . Diabetes Brother   . Diabetes Sister   . Diabetes Sister   . Pancreatic cancer Sister   . Diabetes Brother   . Heart disease Brother   . Diabetes Brother   . Heart disease Brother   . Diabetes Brother   . Heart disease Brother   . Diabetes Brother   . Heart disease Brother   . Diabetes Brother   . Heart disease Brother   . Diabetes Brother     Past Surgical History:  Procedure Laterality Date  . ABDOMINAL HYSTERECTOMY     "partial"  . BREAST BIOPSY Right   . BREAST LUMPECTOMY Right   . CARDIOVERSION N/A 03/30/2014   Procedure: CARDIOVERSION - BEDSIDE;  Surgeon: [name], MD;  Location: [facility];  Service: Cardiovascular;  Laterality:  N/A;  . CARDIOVERSION N/A 03/31/2014   Procedure: CARDIOVERSION  (BEDSIDE) ;  Surgeon: Lelon Perla, MD;  Location: [place];  Service: Cardiovascular;  Laterality: N/A;  . CARDIOVERSION N/A 04/27/2014   Procedure: CARDIOVERSION;  Surgeon: Darlin Coco, MD;  Location: [facility] ENDOSCOPY;  Service: Cardiovascular;  Laterality: N/A;  . CARDIOVERSION N/A 04/13/2015   Procedure: CARDIOVERSION;  Surgeon: Thayer Headings, MD;  Location: [place];  Service: Cardiovascular;  Laterality: N/A;  . CARDIOVERSION N/A 01/08/2017   Procedure: CARDIOVERSION;  Surgeon: Sanda Klein, MD;  Location: [facility] ENDOSCOPY;  Service: Cardiovascular;  Laterality: N/A;  . CARDIOVERSION N/A 09/19/2017   Procedure:  CARDIOVERSION;  Surgeon: [name], MD;  Location: [facility] ENDOSCOPY;  Service: Cardiovascular;  Laterality: N/A;  . CARDIOVERSION N/A 10/25/2017   Procedure: CARDIOVERSION;  Surgeon: Thayer Headings, MD;  Location: [place];  Service: Cardiovascular;  Laterality: N/A;  . CARPAL TUNNEL RELEASE Right   . CATARACT EXTRACTION W/ INTRAOCULAR LENS  IMPLANT, BILATERAL Bilateral   . COLONOSCOPY    . EXCISIONAL HEMORRHOIDECTOMY    . INSERTION OF MESH N/A 05/25/2017   Procedure: INSERTION OF MESH;  Surgeon: Ralene Ok, MD;  Location[facility];  Service: General;  Laterality: N/A;  . TEE WITHOUT CARDIOVERSION N/A 03/30/2014   Procedure: TRANSESOPHAGEAL ECHOCARDIOGRAM (TEE);  Surgeon: [name], MD;  Location: [facility] ENDOSCOPY;  Service: Cardiovascular;  Laterality: N/A;  . THYROIDECTOMY    . TONSILLECTOMY     Social History   Occupational History  . Occupation: Retired  Tobacco Use  . Smoking status: Never Smoker  . Smokeless tobacco: Never Used  Substance and Sexual Activity  . Alcohol use: No    Alcohol/week: 0.0 standard drinks  . Drug use: No  . Sexual activity: Never

## 2018-08-26 ENCOUNTER — Ambulatory Visit: Payer: Medicare Other | Admitting: Physician Assistant

## 2018-08-27 ENCOUNTER — Ambulatory Visit: Payer: Medicare Other | Admitting: Physician Assistant

## 2018-08-27 ENCOUNTER — Encounter: Payer: Self-pay | Admitting: Physician Assistant

## 2018-08-27 VITALS — BP 142/70 | HR 52 | Ht 66.0 in | Wt 190.8 lb

## 2018-08-27 DIAGNOSIS — I4819 Other persistent atrial fibrillation: Secondary | ICD-10-CM | POA: Diagnosis not present

## 2018-08-27 DIAGNOSIS — R001 Bradycardia, unspecified: Secondary | ICD-10-CM | POA: Diagnosis not present

## 2018-08-27 DIAGNOSIS — I10 Essential (primary) hypertension: Secondary | ICD-10-CM | POA: Diagnosis not present

## 2018-08-27 DIAGNOSIS — I420 Dilated cardiomyopathy: Secondary | ICD-10-CM

## 2018-08-27 DIAGNOSIS — R1032 Left lower quadrant pain: Secondary | ICD-10-CM

## 2018-08-27 HISTORY — DX: Dilated cardiomyopathy: I42.0

## 2018-08-27 MED ORDER — DILTIAZEM HCL ER COATED BEADS 300 MG PO CP24: 300.0000 mg | ORAL_CAPSULE | Freq: Every day | ORAL | 3 refills | Status: DC

## 2018-08-27 NOTE — Progress Notes (Signed)
Cardiology Office Note:    Date:  08/27/2018   ID:  [name], DOB 03/07/1931, MRN [id number]  PCP:  Lavone Orn, MD  Cardiologist:  Sherren Mocha, MD  Electrophysiologist:  None   Referring MD: Lavone Orn, MD   Chief Complaint  Patient presents with  . Follow-up    AFib    History of Present Illness:    [name] is a 84 y.o. female with persistent atrial fibrillation status post multiple cardioversions tachycardia induced cardiomyopathy last cardioversion was in December 2018.  She has been maintained on amiodarone for rhythm control.  EF in November 2018 was 40-45% by echocardiogram.  This returned to normal by most recent echocardiogram in June 2019.  She was last seen by [name] in June 2019.  [name] returns for follow-up.  She is here alone.  Over the weekend, she had symptoms of dizziness.  She also noted some nausea.  She denies syncope or near syncope.  She has not had chest pain, shortness of breath.  Her CPAP does not work.  She denies true paroxysmal nocturnal dyspnea.  She has had some left knee swelling and her orthopedist plans on doing injections.  She denies any bleeding issues.  She has noted some left lower quadrant pain at times.  Prior CV studies:   The following studies were reviewed today:  Echocardiogram 04/24/2018 Moderate concentric LVH, EF 65-70, normal wall motion, grade 2 diastolic dysfunction, mild AI, mild MR, moderate LAE, mild RAE, mild TR, PASP 40  Echocardiogram 10/11/2017 Moderate LVH, severe focal basal hypertrophy of the septum to EF 40-45, diffuse HK, aortic sclerosis without stenosis, MAC, severe LAE, moderate TR, PASP 33  TEE (03/2014):  No LAA clot, moderate LAE, core triatriatum type structure in RA with no stenosis, normal EF 55-60%, mild MR  Past Medical History:  Diagnosis Date  . ADENOCARCINOMA, BREAST 10/13/2010   Qualifier: Diagnosis of  By: Nils Pyle CMA (AAMA), Mearl Latin    . Allergy    SEASONAL  . Anemia     years ago  . Arthritis    "all my joints" (03/31/2014)  . Atrial fibrillation ([place])   . Atrial fibrillation with RVR ([place]) 03/31/2015   DLCO dropped from 100% in 2015-50% in 01/2017  . Barrett's esophagus   . Breast cancer [facility])    s/p right breast lumpectomy  . Cervical cancer ([place])   . DJD (degenerative joint disease) of knee   . Dysrhythmia    Afib  . GERD (gastroesophageal reflux disease)   . Glaucoma   . H/O hiatal hernia   . Heart murmur   . Hx of transesophageal echocardiography (TEE) for monitoring    TEE (03/2014): No LAA clot, moderate LAE, core triatriatum type structure in RA with no stenosis, normal EF 55-60%, mild MR  . Hypertension   . Hypothyroidism, postsurgical   . Migraine    "last one was years ago" (03/31/2014)  . OSA on CPAP    settings at 10-11   . RECTAL FISSURE 10/13/2010   Qualifier: Diagnosis of  By: Nils Pyle CMA ([place]), Mearl Latin    . Sleep apnea   . Thyroid cancer [facility])    s/p thyroidectomy  . Type II diabetes mellitus ([place])    type II    Surgical Hx: The patient  has a past surgical history that includes Thyroidectomy; TEE without cardioversion (N/A, 03/30/2014); Cardioversion (N/A, 03/30/2014); Tonsillectomy; Excisional hemorrhoidectomy; Carpal tunnel release (Right); Abdominal hysterectomy; Cataract extraction w/ intraocular lens  implant,  bilateral (Bilateral); Breast biopsy (Right); Breast lumpectomy (Right); Cardioversion (N/A, 03/31/2014); Cardioversion (N/A, 04/27/2014); Cardioversion (N/A, 04/13/2015); Colonoscopy; Cardioversion (N/A, 01/08/2017); Insertion of mesh (N/A, 05/25/2017); Cardioversion (N/A, 09/19/2017); and Cardioversion (N/A, 10/25/2017).   Current Medications: Current Meds  Medication Sig  . acetaminophen (TYLENOL) 650 MG CR tablet Take 650 mg by mouth every 8 (eight) hours as needed for pain.   Marland Kitchen amiodarone (PACERONE) 200 MG tablet TAKE 1 TABLET BY MOUTH  DAILY  . ELIQUIS 5 MG TABS tablet TAKE 1 TABLET BY MOUTH TWO  TIMES DAILY  .  ferrous sulfate 325 (65 FE) MG tablet Take 325 mg by mouth daily with breakfast.  . fluticasone (FLONASE) 50 MCG/ACT nasal spray Place 1 spray into both nostrils at bedtime.   . lansoprazole (PREVACID) 30 MG capsule Take 30 mg by mouth as directed.  Marland Kitchen levothyroxine (SYNTHROID, LEVOTHROID) 175 MCG tablet Take 175 mcg by mouth daily before breakfast.  . LORazepam (ATIVAN) 1 MG tablet Take 1.5 mg by mouth at bedtime.  Marland Kitchen losartan (COZAAR) 25 MG tablet TAKE 1 TABLET BY MOUTH  DAILY  . Lutein 6 MG CAPS Take 6 mg by mouth daily.   . metFORMIN (GLUMETZA) 500 MG (MOD) 24 hr tablet Take 500 mg by mouth 2 (two) times daily with a meal.   . metoprolol tartrate (LOPRESSOR) 25 MG tablet Take 0.5 tablets (12.5 mg total) by mouth 2 (two) times daily.  . Multiple Vitamins-Minerals (ICAPS AREDS 2) CAPS Take 1 capsule by mouth 2 (two) times daily.   . Polyvinyl Alcohol-Povidone PF (REFRESH) 1.4-0.6 % SOLN Place 1 drop into both eyes at bedtime.  . potassium chloride (KLOR-CON) 20 MEQ packet Take 20 mEq by mouth daily.   . rosuvastatin (CRESTOR) 10 MG tablet Take 10 mg by mouth at bedtime.  . sertraline (ZOLOFT) 50 MG tablet Take 50 mg by mouth daily.  . vitamin B-12 (CYANOCOBALAMIN) 500 MCG tablet Take 500 mcg by mouth daily as needed (energy).  . [DISCONTINUED] diltiazem (CARDIZEM CD) 360 MG 24 hr capsule Take 1 capsule (360 mg total) by mouth daily.     Allergies:   Penicillins   Social History   Tobacco Use  . Smoking status: Never Smoker  . Smokeless tobacco: Never Used  Substance Use Topics  . Alcohol use: No    Alcohol/week: 0.0 standard drinks  . Drug use: No     Family Hx: The patient's family history includes Diabetes in her brother, brother, brother, brother, brother, brother, brother, sister, sister, and sister; Heart attack in her brother; Heart disease in her brother, brother, brother, brother, brother, brother, and sister; Pancreatic cancer in her sister; Prostate cancer in her  father.  ROS:   Please see the history of present illness.    Review of Systems  Respiratory: Positive for cough and wheezing.   Musculoskeletal: Positive for joint pain.  Neurological: Positive for dizziness and loss of balance.   All other systems reviewed and are negative.   EKGs/Labs/Other Test Reviewed:    EKG:  EKG is  ordered today.  The ekg ordered today demonstrates sinus bradycardia, heart rate 47, normal axis, IVCD, QTC 454, similar to old EKGs  Recent Labs: 10/18/2017: Hemoglobin 13.9; Platelets 186 04/22/2018: ALT 11; BUN 21; Creatinine, Ser 0.94; Potassium 4.2; Sodium 142; TSH 0.580   Recent Lipid Panel No results found for: CHOL, TRIG, HDL, CHOLHDL, LDLCALC, LDLDIRECT  Physical Exam:    VS:  BP (!) 142/70   Pulse (!) 52   Ht 5'  6" (1.676 m)   Wt 190 lb 12.8 oz (86.5 kg)   LMP  (LMP Unknown)   SpO2 98%   BMI 30.80 kg/m     Wt Readings from Last 3 Encounters:  08/27/18 190 lb 12.8 oz (86.5 kg)  04/23/18 185 lb 9.6 oz (84.2 kg)  04/22/18 187 lb 8 oz (85 kg)     Physical Exam  Constitutional: She is oriented to person, place, and time. She appears well-developed and well-nourished. No distress.  HENT:  Head: Normocephalic and atraumatic.  Eyes: No scleral icterus.  Neck: No JVD present. No thyromegaly present.  Cardiovascular: Regular rhythm. Bradycardia present.  Murmur heard.  Low-pitched systolic murmur is present with a grade of 2/6 at the upper right sternal border. Pulmonary/Chest: Effort normal. She has no rales.  Abdominal: Soft.  Musculoskeletal: She exhibits edema (trace ankle edema bilat).  Lymphadenopathy:    She has no cervical adenopathy.  Neurological: She is alert and oriented to person, place, and time.  Skin: Skin is warm and dry.  Psychiatric: She has a normal mood and affect.    ASSESSMENT & PLAN:    Persistent atrial fibrillation She is maintaining normal sinus rhythm on amiodarone, diltiazem and metoprolol.  LFTs and TSH were  normal in June 2019.  Continue current dose of Apixaban.  Bradycardia She has recent symptoms of dizziness.  She did not feel well over the weekend.  Her heart rate is in the 40s.  It is not clear to me if she is truly symptomatic.  However, given her symptoms I am concerned that she could be symptomatic.  She is on 3 AV nodal blocking agents.  I reviewed her notes from the atrial fibrillation clinic.  There was some concern about her going back into atrial fibrillation if she was taken off of beta-blocker therapy.  Therefore, I will decrease her diltiazem.  -Decrease diltiazem to 300 mg daily  -Close follow-up with me in 4 weeks  Dilated cardiomyopathy ([place]) Ejection fraction returned to normal by most recent echocardiogram.  This was likely tachycardia induced.  Essential hypertension Blood pressure somewhat above target.  I have asked her to monitor her blood pressure at home.  If it remains above target, we can increase her losartan to 50 mg daily.  LLQ pain She has had some transient left lower quadrant pain.  I have asked her to follow-up with primary care as this could signify diverticulitis.   Dispo:  Return in about 4 weeks (around 09/24/2018) for Close Follow Up, w/ Richardson Dopp, PA-C.   Medication Adjustments/Labs and Tests Ordered: Current medicines are reviewed at length with the patient today.  Concerns regarding medicines are outlined above.  Tests Ordered: Orders Placed This Encounter  Procedures  . EKG 12-Lead   Medication Changes: Meds ordered this encounter  Medications  . diltiazem (CARDIZEM CD) 300 MG 24 hr capsule    Sig: Take 1 capsule (300 mg total) by mouth daily.    Dispense:  90 capsule    Refill:  3    DECREASE IN DOSE    Signed, Richardson Dopp, PA-C  08/27/2018 12:54 PM    [facility] [address], [place], [place]  [address] Phone: [phone]; Fax: [phone]

## 2018-08-27 NOTE — Patient Instructions (Signed)
Medication Instructions:  1. DECREASE CARDIZEM TO 300 MG DAILY; NEW RX HAS BEEN SENT IN  If you need a refill on your cardiac medications before your next appointment, please call your pharmacy.   Lab work: NONE ORDERED TODAY If you have labs (blood work) drawn today and your tests are completely normal, you will receive your results only by: Marland Kitchen MyChart Message (if you have MyChart) OR . A paper copy in the mail If you have any lab test that is abnormal or we need to change your treatment, we will call you to review the results.  Testing/Procedures: NONE ORDERED TODAY  Follow-Up: At [facility], you and your health needs are our priority.  As part of our continuing mission to provide you with exceptional heart care, we have created designated Provider Care Teams.  These Care Teams include your primary Cardiologist (physician) and Advanced Practice Providers (APPs -  Physician Assistants and Nurse Practitioners) who all work together to provide you with the care you need, when you need it. You will need a follow up appointment in:  4 weeks.  Please call our office 2 months in advance to schedule this appointment.  You may see  SCOTT WEAVER, PAC  or one of the following Advanced Practice Providers on your designated Care Team:   Any Other Special Instructions Will Be Listed Below (If Applicable). MONITOR BLOOD PRESSURE AND CALL IF CONSISTENTLY RUNNING 140/90 OR HIGHER ; [phone]  MAKE SURE TO FOLLOW UP WITH PRIMARY CARE IF LEFT LOWER QUADRANT PAIN CONTINUES

## 2018-09-05 ENCOUNTER — Telehealth: Payer: Self-pay | Admitting: Physician Assistant

## 2018-09-05 DIAGNOSIS — Z79899 Other long term (current) drug therapy: Secondary | ICD-10-CM

## 2018-09-05 DIAGNOSIS — R42 Dizziness and giddiness: Secondary | ICD-10-CM

## 2018-09-05 MED ORDER — LOSARTAN POTASSIUM 50 MG PO TABS: 50.0000 mg | ORAL_TABLET | Freq: Every day | ORAL | 3 refills | Status: DC

## 2018-09-05 NOTE — Telephone Encounter (Signed)
Called patient about her message. Patient complaining about her BP being elevated, dizziness, and weakness. Patient last BP was 169/93 with HR 53. Informed patient that her message would be sent to Richardson Dopp PA for further advisement.

## 2018-09-05 NOTE — Telephone Encounter (Signed)
New message  Pt c/o BP issue: STAT if pt c/o blurred vision, one-sided weakness or slurred speech  1. What are your last 5 BP readings? 169/93  158/92 171/96 162/92 156/89  2. Are you having any other symptoms (ex. Dizziness, headache, blurred vision, passed out)? Dizziness, weak, having difficulty walking  3. What is your BP issue? Bp is elevated

## 2018-09-05 NOTE — Telephone Encounter (Signed)
This call should had been routed to Triage for further evaluation. I will route triage.

## 2018-09-05 NOTE — Telephone Encounter (Signed)
Called patient back about Natasha Dopp PA recommendations.   "1.  If dizziness, weakness and difficulty walking is acute/sudden onset, she should proceed directly to the emergency room. 2.  Increase losartan to 50 mg daily, #90, 3 refills 3.  Obtain BMET in 1 week. 4.  Keep follow-up with me as planned."   Patient stated she does not need to go to the ER, that she is not feeling that bad. Patient will start taking Losartan 50 mg by mouth and will come in on 09/12/18 for BMET. Patient will follow- up as planned.

## 2018-09-05 NOTE — Telephone Encounter (Signed)
1.  If dizziness, weakness and difficulty walking is acute/sudden onset, she should proceed directly to the emergency room. 2.  Increase losartan to 50 mg daily, #90, 3 refills 3.  Obtain BMET in 1 week. 4.  Keep follow-up with me as planned. Richardson Dopp, PA-C 09/05/2018 4:10 PM

## 2018-09-10 ENCOUNTER — Telehealth: Payer: Self-pay | Admitting: Cardiovascular Disease

## 2018-09-10 NOTE — Telephone Encounter (Signed)
Thanks; agree with plan

## 2018-09-10 NOTE — Telephone Encounter (Signed)
New message  Pt c/o BP issue: STAT if pt c/o blurred vision, one-sided weakness or slurred speech  1. What are your last 5 BP readings? 173/99bp 174/100 bp    2. Are you having any other symptoms (ex. Dizziness, headache, blurred vision, passed out)? Weak, dizziness  3. What is your BP issue? Blood pressure is elevated

## 2018-09-10 NOTE — Telephone Encounter (Addendum)
Patient is concerned about her blood pressure being elevated. Patient called about this same issue last week and was told to increase her Losartan. Patient has not been doing this. Patient stated she takes Losartan 25 mg BID. Encouraged patient to take Losartan as prescribed. Patient stated she already took Losartan 25 mg this morning. Encouraged patient to take another Losartan 25 mg now, that this should help her BP. Patient stated she needs to see someone today. Informed patient that our office could get her in tomorrow with Ermalinda Barrios PA. Patient agreed with plan to take her medication as prescribed and see PA tomorrow. Moved patient's lab appt to tomorrow as well.  Will forward to [name] for further advisement.

## 2018-09-11 ENCOUNTER — Other Ambulatory Visit: Payer: Medicare Other | Admitting: *Deleted

## 2018-09-11 ENCOUNTER — Ambulatory Visit: Payer: Medicare Other | Admitting: Physician Assistant

## 2018-09-11 ENCOUNTER — Encounter: Payer: Self-pay | Admitting: Physician Assistant

## 2018-09-11 VITALS — BP 132/70 | HR 56 | Ht 66.0 in | Wt 192.8 lb

## 2018-09-11 DIAGNOSIS — I10 Essential (primary) hypertension: Secondary | ICD-10-CM | POA: Diagnosis not present

## 2018-09-11 DIAGNOSIS — E7800 Pure hypercholesterolemia, unspecified: Secondary | ICD-10-CM

## 2018-09-11 DIAGNOSIS — Z79899 Other long term (current) drug therapy: Secondary | ICD-10-CM

## 2018-09-11 DIAGNOSIS — I420 Dilated cardiomyopathy: Secondary | ICD-10-CM

## 2018-09-11 DIAGNOSIS — I4819 Other persistent atrial fibrillation: Secondary | ICD-10-CM

## 2018-09-11 DIAGNOSIS — R42 Dizziness and giddiness: Secondary | ICD-10-CM

## 2018-09-11 LAB — BASIC METABOLIC PANEL
BUN / CREAT RATIO: 24 (ref 12–28)
BUN: 21 mg/dL (ref 8–27)
CO2: 23 mmol/L (ref 20–29)
CREATININE: 0.88 mg/dL (ref 0.57–1.00)
Calcium: 9.1 mg/dL (ref 8.7–10.3)
Chloride: 103 mmol/L (ref 96–106)
GFR calc Af Amer: 69 mL/min/{1.73_m2} (ref >59)
GFR calc non Af Amer: 60 mL/min/{1.73_m2} (ref >59)
GLUCOSE: 87 mg/dL (ref 65–99)
Potassium: 4.1 mmol/L (ref 3.5–5.2)
SODIUM: 142 mmol/L (ref 134–144)

## 2018-09-11 MED ORDER — HYDROCHLOROTHIAZIDE 25 MG PO TABS: 25.0000 mg | ORAL_TABLET | Freq: Every day | ORAL | 3 refills | Status: DC

## 2018-09-11 NOTE — Addendum Note (Signed)
Addended by: Mendel Ryder on: 09/11/2018 12:01 PM   Modules accepted: Orders

## 2018-09-11 NOTE — Progress Notes (Signed)
Cardiology Office Note    Date:  09/11/2018   ID:  [name], DOB [birth year], MRN [id number]  PCP:  Lavone Orn, MD  Cardiologist: Sherren Mocha, MD EPS: None  Chief Complaint  Patient presents with  . Hypertension    History of Present Illness:  [name] is a 84 y.o. female with history of persistent atrial fibrillation status post multiple cardioversions, tachycardia induced cardiomyopathy ejection fraction 11/18 40 to 45% normalized to 65 to 70% on echo 04/2018.  She is been maintained on amiodarone and Eliquis.  Patient saw Richardson Dopp, PA-C 08/27/2018 and was in normal sinus rhythm.  She was having symptoms of dizziness and her heart rate was in the 40s so diltiazem was decreased to 300 mg daily.  A. fib clinic is concerned with her going back into atrial fibrillation if she was taken off beta-blockers.  Blood pressure was also up and losartan was increased to 50 mg daily.  She called in with continued elevated blood pressures and was placed on my schedule today. She had a big problem with her daughter and became very upset. Also wasn't taking the losartan properly but now that she's taking it her BP is better and pulse has been in the 50's.  She also eats canned soup and frozen meals just about daily.  Has increased swelling of her lower extremities and asking for a diuretic.    Past Medical History:  Diagnosis Date  . Acute bronchitis 11/15/2017  . ADENOCARCINOMA, BREAST 10/13/2010   Qualifier: Diagnosis of  By: Nils Pyle CMA (AAMA), Mearl Latin    . 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 Allergy    SEASONAL  . Anemia    years ago  . ANEMIA, IRON DEFICIENCY 10/14/2010   Qualifier: Diagnosis of  By: Sharlett Iles MD Byrd Hesselbach   . Anemia, unspecified 10/13/2010   Qualifier: Diagnosis of  By: Nils Pyle CMA ([place]), Mearl Latin    . Anticoagulated by anticoagulation treatment - Eliquis 03/30/2014   Started May 2015   . Anxiety state 10/13/2010   Qualifier:  Diagnosis of  By: Nils Pyle CMA ([place]), Mearl Latin    . Arthritis    "all my joints" (03/31/2014)  . Atrial fibrillation ([place])   . Atrial fibrillation with RVR ([place]) 03/31/2015   DLCO dropped from 100% in 2015-50% in 01/2017  . Barrett's esophagus   . Breast cancer [facility])    s/p right breast lumpectomy  . CARDIAC MURMUR 10/13/2010   Qualifier: Diagnosis of  By: Nils Pyle CMA ([place]), Mearl Latin    . 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 Cervical cancer ([place])   . Chronic cough 01/02/2017   GERD, hiatal hernia D[place] amio  . COLONIC POLYPS, ADENOMATOUS, HX OF 10/13/2010   Qualifier: Diagnosis of  By: Nils Pyle CMA (AAMA), Mearl Latin    . Degenerative disorder of eye 07/25/2012  . Diabetes mellitus without complication ([place])   . Dilated cardiomyopathy ([place]) 08/27/2018  . DIVERTICULOSIS, COLON 10/13/2010   Qualifier: Diagnosis of  By: Nils Pyle CMA (AAMA), Mearl Latin    . DJD (degenerative joint disease) of knee   . Dysrhythmia    Afib  . EROSIVE ESOPHAGITIS 10/13/2010   Qualifier: Diagnosis of  By: Nils Pyle CMA ([place]), Mearl Latin    . Error, refractive, myopia 07/09/2012  . Essential hypertension 10/13/2010   Qualifier: Diagnosis of  By: Nils Pyle CMA ([place]), Mearl Latin    . Exotropia, intermittent 07/09/2012  . GERD 10/14/2010   Qualifier: Diagnosis of  By: Sharlett Iles MD Byrd Hesselbach  GERD (gastroesophageal reflux disease)   . Glaucoma   . H/O hiatal hernia   . Heart murmur   . Hiatal hernia s/p robotic repair & fundoplication 6/44/0347 42/03/9562   Qualifier: Diagnosis of  By: Nils Pyle CMA (AAMA), Mearl Latin    . History of laser assisted in situ keratomileusis 07/25/2012  . Hx of transesophageal echocardiography (TEE) for monitoring    TEE (03/2014): No LAA clot, moderate LAE, core triatriatum type structure in RA with no stenosis, normal EF 55-60%, mild MR  . HYPERCHOLESTEROLEMIA 10/13/2010   Qualifier: Diagnosis of  By: Nils Pyle CMA ([place]), Mearl Latin    . Hypertension   . Hypothyroidism, postsurgical   . Irritable bowel syndrome 10/13/2010    Qualifier: Diagnosis of  By: Nils Pyle CMA (AAMA), Mearl Latin    . Lumbar stenosis with neurogenic claudication 04/20/2016  . Malignant neoplasm of breast ([place]) 02/25/2016  . Migraine    "last one was years ago" (03/31/2014)  . MIGRAINE HEADACHE 10/13/2010   Qualifier: Diagnosis of  By: Nils Pyle CMA ([place]), Mearl Latin    . 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 OSA on CPAP    settings at 10-11   . OSTEOARTHRITIS 10/13/2010   Qualifier: Diagnosis of  By: Nils Pyle CMA ([place]), Mearl Latin    . Post-surgical hypothyroidism 04/09/2014  . RECTAL FISSURE 10/13/2010   Qualifier: Diagnosis of  By: Nils Pyle CMA ([place]), Mearl Latin    . S/P Nissen fundoplication (without gastrostomy tube) procedure 05/25/2017  . Sleep apnea   . THYROID CANCER 10/13/2010   Qualifier: Diagnosis of  By: Nils Pyle CMA ([place]), Mearl Latin    . Thyroid cancer [facility])    s/p thyroidectomy  . Type II diabetes mellitus ([place])    type II     Past Surgical History:  Procedure Laterality Date  . ABDOMINAL HYSTERECTOMY     "partial"  . BREAST BIOPSY Right   . BREAST LUMPECTOMY Right   . CARDIOVERSION N/A 03/30/2014   Procedure: CARDIOVERSION - BEDSIDE;  Surgeon: [name], MD;  Location: [place];  Service: Cardiovascular;  Laterality: N/A;  . CARDIOVERSION N/A 03/31/2014   Procedure: CARDIOVERSION  (BEDSIDE) ;  Surgeon: Lelon Perla, MD;  Location: [place];  Service: Cardiovascular;  Laterality: N/A;  . CARDIOVERSION N/A 04/27/2014   Procedure: CARDIOVERSION;  Surgeon: Darlin Coco, MD;  Location: [place];  Service: Cardiovascular;  Laterality: N/A;  . CARDIOVERSION N/A 04/13/2015   Procedure: CARDIOVERSION;  Surgeon: Thayer Headings, MD;  Location: [place];  Service: Cardiovascular;  Laterality: N/A;  . CARDIOVERSION N/A 01/08/2017   Procedure: CARDIOVERSION;  Surgeon: Sanda Klein, MD;  Location: [facility] ENDOSCOPY;  Service: Cardiovascular;  Laterality: N/A;  . CARDIOVERSION N/A 09/19/2017   Procedure: CARDIOVERSION;  Surgeon:  [name], MD;  Location: [facility] ENDOSCOPY;  Service: Cardiovascular;  Laterality: N/A;  . CARDIOVERSION N/A 10/25/2017   Procedure: CARDIOVERSION;  Surgeon: Acie Fredrickson Wonda Cheng, MD;  Location: [place];  Service: Cardiovascular;  Laterality: N/A;  . CARPAL TUNNEL RELEASE Right   . CATARACT EXTRACTION W/ INTRAOCULAR LENS  IMPLANT, BILATERAL Bilateral   . COLONOSCOPY    . EXCISIONAL HEMORRHOIDECTOMY    . INSERTION OF MESH N/A 05/25/2017   Procedure: INSERTION OF MESH;  Surgeon: Ralene Ok, MD;  Location: [facility];  Service: General;  Laterality: N/A;  . TEE WITHOUT CARDIOVERSION N/A 03/30/2014   Procedure: TRANSESOPHAGEAL ECHOCARDIOGRAM (TEE);  Surgeon: [name], MD;  Location: [facility] ENDOSCOPY;  Service: Cardiovascular;  Laterality: N/A;  . THYROIDECTOMY    .  TONSILLECTOMY      Current Medications: Current Meds  Medication Sig  . acetaminophen (TYLENOL) 650 MG CR tablet Take 650 mg by mouth every 8 (eight) hours as needed for pain.   Marland Kitchen amiodarone (PACERONE) 200 MG tablet TAKE 1 TABLET BY MOUTH  DAILY  . diltiazem (CARDIZEM CD) 300 MG 24 hr capsule Take 1 capsule (300 mg total) by mouth daily.  Marland Kitchen ELIQUIS 5 MG TABS tablet TAKE 1 TABLET BY MOUTH TWO  TIMES DAILY  . ferrous sulfate 325 (65 FE) MG tablet Take 325 mg by mouth daily with breakfast.  . fluticasone (FLONASE) 50 MCG/ACT nasal spray Place 1 spray into both nostrils at bedtime.   . lansoprazole (PREVACID) 30 MG capsule Take 30 mg by mouth as directed.  Marland Kitchen levothyroxine (SYNTHROID, LEVOTHROID) 175 MCG tablet Take 175 mcg by mouth daily before breakfast.  . LORazepam (ATIVAN) 1 MG tablet Take 1.5 mg by mouth at bedtime.  Marland Kitchen losartan (COZAAR) 50 MG tablet Take 1 tablet (50 mg total) by mouth daily.  . Lutein 6 MG CAPS Take 6 mg by mouth daily.   . metFORMIN (GLUMETZA) 500 MG (MOD) 24 hr tablet Take 500 mg by mouth 2 (two) times daily with a meal.   . metoprolol tartrate (LOPRESSOR) 25 MG tablet Take 0.5 tablets (12.5 mg total) by  mouth 2 (two) times daily.  . Multiple Vitamins-Minerals (ICAPS AREDS 2) CAPS Take 1 capsule by mouth 2 (two) times daily.   . Polyvinyl Alcohol-Povidone PF (REFRESH) 1.4-0.6 % SOLN Place 1 drop into both eyes at bedtime.  . potassium chloride (KLOR-CON) 20 MEQ packet Take 20 mEq by mouth daily.   . rosuvastatin (CRESTOR) 10 MG tablet Take 10 mg by mouth at bedtime.  . sertraline (ZOLOFT) 50 MG tablet Take 50 mg by mouth daily.  . vitamin B-12 (CYANOCOBALAMIN) 500 MCG tablet Take 500 mcg by mouth daily as needed (energy).     Allergies:   Penicillins   Social History   Socioeconomic History  . Marital status: Divorced    Spouse name: Not on file  . Number of children: 2  . Years of education: Not on file  . Highest education level: Not on file  Occupational History  . Occupation: Retired  Scientific laboratory technician  . Financial resource strain: Not on file  . Food insecurity:    Worry: Not on file    Inability: Not on file  . Transportation needs:    Medical: Not on file    Non-medical: Not on file  Tobacco Use  . Smoking status: Never Smoker  . Smokeless tobacco: Never Used  Substance and Sexual Activity  . Alcohol use: No    Alcohol/week: 0.0 standard drinks  . Drug use: No  . Sexual activity: Never  Lifestyle  . Physical activity:    Days per week: Not on file    Minutes per session: Not on file  . Stress: Not on file  Relationships  . Social connections:    Talks on phone: Not on file    Gets together: Not on file    Attends religious service: Not on file    Active member of club or organization: Not on file    Attends meetings of clubs or organizations: Not on file    Relationship status: Not on file  Other Topics Concern  . Not on file  Social History Narrative  . Not on file     Family History:  The patient's   family  history includes Diabetes in her brother, brother, brother, brother, brother, brother, brother, sister, sister, and sister; Heart attack in her brother;  Heart disease in her brother, brother, brother, brother, brother, brother, and sister; Pancreatic cancer in her sister; Prostate cancer in her father.   ROS:   Please see the history of present illness.    Review of Systems  Cardiovascular: Positive for leg swelling.  Respiratory: Positive for cough.   Neurological: Positive for loss of balance.  Psychiatric/Behavioral: The patient is nervous/anxious.    All other systems reviewed and are negative.   PHYSICAL EXAM:   VS:  BP 132/70   Pulse (!) 56   Ht 5\' 6"  (1.676 m)   Wt 192 lb 12.8 oz (87.5 kg)   LMP  (LMP Unknown)   SpO2 93%   BMI 31.12 kg/m   Physical Exam  GEN: Well nourished, well developed, in no acute distress  Neck: no JVD, carotid bruits, or masses Cardiac:RRR; 1/6 systolic murmur in the left sternal border Respiratory:  clear to auscultation bilaterally, normal work of breathing GI: soft, nontender, nondistended, + BS Ext: +1 edema right greater than left without cyanosis, clubbing, Good distal pulses bilaterally Neuro:  Alert and Oriented x 3 Psych: euthymic mood, full affect  Wt Readings from Last 3 Encounters:  09/11/18 192 lb 12.8 oz (87.5 kg)  08/27/18 190 lb 12.8 oz (86.5 kg)  04/23/18 185 lb 9.6 oz (84.2 kg)      Studies/Labs Reviewed:   EKG:  EKG is not ordered today.   Recent Labs: 10/18/2017: Hemoglobin 13.9; Platelets 186 04/22/2018: ALT 11; BUN 21; Creatinine, Ser 0.94; Potassium 4.2; Sodium 142; TSH 0.580   Lipid Panel No results found for: CHOL, TRIG, HDL, CHOLHDL, VLDL, LDLCALC, LDLDIRECT  Additional studies/ records that were reviewed today include:  2D echo 6/12/2019Study Conclusions   - Left ventricle: The cavity size was normal. There was moderate   concentric hypertrophy. Systolic function was vigorous. The   estimated ejection fraction was in the range of 65% to 70%. Wall   motion was normal; there were no regional wall motion   abnormalities. Features are consistent with a  pseudonormal left   ventricular filling pattern, with concomitant abnormal relaxation   and increased filling pressure (grade 2 diastolic dysfunction). - Aortic valve: There was mild regurgitation. - Mitral valve: Calcified annulus. Mildly thickened leaflets .   There was mild regurgitation. - Left atrium: The atrium was moderately dilated. - Right atrium: The atrium was mildly dilated. - Tricuspid valve: There was mild regurgitation. - Pulmonary arteries: Systolic pressure was mildly to moderately   increased. PA peak pressure: 40 mm Hg (S). - Inferior vena cava: The vessel was normal in size. The   respirophasic diameter changes were in the normal range (= 50%),   consistent with normal central venous pressure. - Pericardium, extracardiac: There was no pericardial effusion.   Impressions:   - When compared to the rpior study from 12/11/2016 biventricular   systolic function has improved and is now normal.       ASSESSMENT:    1. Essential hypertension   2. Persistent atrial fibrillation   3. Dilated cardiomyopathy ([place])   4. HYPERCHOLESTEROLEMIA      PLAN:  In order of problems listed above:  Essential hypertension blood pressures been running high and losartan increased to 50 mg daily when diltiazem was decreased.  She became upset with her daughter and that is when her blood pressure went up.  She was  also not taking the losartan properly.  Now that she straighten things out with her daughter and taking her medicines properly her blood pressure has come down.  She is eating a very high sodium diet.  Has some leg swelling and asking for a diuretic.  Instructed on 2 g sodium diet we will give low-dose HCTZ 25 mg once daily.  She is due for blood work today and has follow-up with Richardson Dopp in 2 weeks.  Persistent atrial fibrillation on amiodarone, metoprolol, diltiazem and Eliquis.  Diltiazem decreased to 300 mg daily because of bradycardia-heart rate has been in the 50s and  is regular today.  Dilated cardiomyopathy normalized LV function on echo 04/24/2018 but does have grade 2 DD and has increased leg swelling.  Add low-dose HCTZ.  Hypercholesterolemia on Crestor  Medication Adjustments/Labs and Tests Ordered: Current medicines are reviewed at length with the patient today.  Concerns regarding medicines are outlined above.  Medication changes, Labs and Tests ordered today are listed in the Patient Instructions below. Patient Instructions   Medication Instructions:  START: Hydrochlorothiazide 25 MG daily  If you need a refill on your cardiac medications before your next appointment, please call your pharmacy.   Lab work: TODAY: BMET  If you have labs (blood work) drawn today and your tests are completely normal, you will receive your results only by: Marland Kitchen MyChart Message (if you have MyChart) OR . A paper copy in the mail If you have any lab test that is abnormal or we need to change your treatment, we will call you to review the results.  Testing/Procedures: None  Follow-Up: Keep follow up appointment with Richardson Dopp PA-C on 10/01/18 @ 10:15 AM  Any Other Special Instructions Will Be Listed Below (If Applicable). Two Gram Sodium Diet 2000 mg  What is Sodium? Sodium is a mineral found naturally in many foods. The most significant source of sodium in the diet is table salt, which is about 40% sodium.  Processed, convenience, and preserved foods also contain a large amount of sodium.  The body needs only 500 mg of sodium daily to function,  A normal diet provides more than enough sodium even if you do not use salt.  Why Limit Sodium? A build up of sodium in the body can cause thirst, increased blood pressure, shortness of breath, and water retention.  Decreasing sodium in the diet can reduce edema and risk of heart attack or stroke associated with high blood pressure.  Keep in mind that there are many other factors involved in these health problems.   Heredity, obesity, lack of exercise, cigarette smoking, stress and what you eat all play a role.  General Guidelines:  Do not add salt at the table or in cooking.  One teaspoon of salt contains over 2 grams of sodium.  Read food labels  Avoid processed and convenience foods  Ask your dietitian before eating any foods not dicussed in the menu planning guidelines  Consult your physician if you wish to use a salt substitute or a sodium containing medication such as antacids.  Limit milk and milk products to 16 oz (2 cups) per day.  Shopping Hints:  READ LABELS!! "Dietetic" does not necessarily mean low sodium.  Salt and other sodium ingredients are often added to foods during processing.   Menu Planning Guidelines Food Group Choose More Often Avoid  Beverages (see also the milk group All fruit juices, low-sodium, salt-free vegetables juices, low-sodium carbonated beverages Regular vegetable or tomato juices, commercially  softened water used for drinking or cooking  Breads and Cereals Enriched white, wheat, rye and pumpernickel bread, hard rolls and dinner rolls; muffins, cornbread and waffles; most dry cereals, cooked cereal without added salt; unsalted crackers and breadsticks; low sodium or homemade bread crumbs Bread, rolls and crackers with salted tops; quick breads; instant hot cereals; pancakes; commercial bread stuffing; self-rising flower and biscuit mixes; regular bread crumbs or cracker crumbs  Desserts and Sweets Desserts and sweets mad with mild should be within allowance Instant pudding mixes and cake mixes  Fats Butter or margarine; vegetable oils; unsalted salad dressings, regular salad dressings limited to 1 Tbs; light, sour and heavy cream Regular salad dressings containing bacon fat, bacon bits, and salt pork; snack dips made with instant soup mixes or processed cheese; salted nuts  Fruits Most fresh, frozen and canned fruits Fruits processed with salt or sodium-containing  ingredient (some dried fruits are processed with sodium sulfites        Vegetables Fresh, frozen vegetables and low- sodium canned vegetables Regular canned vegetables, sauerkraut, pickled vegetables, and others prepared in brine; frozen vegetables in sauces; vegetables seasoned with ham, bacon or salt pork  Condiments, Sauces, Miscellaneous  Salt substitute with physician's approval; pepper, herbs, spices; vinegar, lemon or lime juice; hot pepper sauce; garlic powder, onion powder, low sodium soy sauce (1 Tbs.); low sodium condiments (ketchup, chili sauce, mustard) in limited amounts (1 tsp.) fresh ground horseradish; unsalted tortilla chips, pretzels, potato chips, popcorn, salsa (1/4 cup) Any seasoning made with salt including garlic salt, celery salt, onion salt, and seasoned salt; sea salt, rock salt, kosher salt; meat tenderizers; monosodium glutamate; mustard, regular soy sauce, barbecue, sauce, chili sauce, teriyaki sauce, steak sauce, Worcestershire sauce, and most flavored vinegars; canned gravy and mixes; regular condiments; salted snack foods, olives, picles, relish, horseradish sauce, catsup   Food preparation: Try these seasonings Meats:    Pork Sage, onion Serve with applesauce  Chicken Poultry seasoning, thyme, parsley Serve with cranberry sauce  Lamb Curry powder, rosemary, garlic, thyme Serve with mint sauce or jelly  Veal Marjoram, basil Serve with current jelly, cranberry sauce  Beef Pepper, bay leaf Serve with dry mustard, unsalted chive butter  Fish [place] leaf, dill Serve with unsalted lemon butter, unsalted parsley butter  Vegetables:    Asparagus Lemon juice   Broccoli Lemon juice   Carrots Mustard dressing parsley, mint, nutmeg, glazed with unsalted butter and sugar   Green beans Marjoram, lemon juice, nutmeg,dill seed   Tomatoes Basil, marjoram, onion   Spice /blend for [employer]" 4 tsp ground thyme 1 tsp ground sage 3 tsp ground rosemary 4 tsp ground marjoram    Test your knowledge 1. A product that says "Salt Free" may still contain sodium. True or False 2. Garlic Powder and Hot Pepper Sauce an be used as alternative seasonings.True or False 3. Processed foods have more sodium than fresh foods.  True or False 4. Canned Vegetables have less sodium than froze True or False  WAYS TO DECREASE YOUR SODIUM INTAKE 1. Avoid the use of added salt in cooking and at the table.  Table salt (and other prepared seasonings which contain salt) is probably one of the greatest sources of sodium in the diet.  Unsalted foods can gain flavor from the sweet, sour, and butter taste sensations of herbs and spices.  Instead of using salt for seasoning, try the following seasonings with the foods listed.  Remember: how you use them to enhance natural food flavors is  limited only by your creativity... Allspice-Meat, fish, eggs, fruit, peas, red and yellow vegetables Almond Extract-Fruit baked goods Anise Seed-Sweet breads, fruit, carrots, beets, cottage cheese, cookies (tastes like licorice) Basil-Meat, fish, eggs, vegetables, rice, vegetables salads, soups, sauces Bay Leaf-Meat, fish, stews, poultry Burnet-Salad, vegetables (cucumber-like flavor) Caraway Seed-Bread, cookies, cottage cheese, meat, vegetables, cheese, rice Cardamon-Baked goods, fruit, soups Celery Powder or seed-Salads, salad dressings, sauces, meatloaf, soup, bread.Do not use  celery salt Chervil-Meats, salads, fish, eggs, vegetables, cottage cheese (parsley-like flavor) Chili Power-Meatloaf, chicken cheese, corn, eggplant, egg dishes Chives-Salads cottage cheese, egg dishes, soups, vegetables, sauces Cilantro-Salsa, casseroles Cinnamon-Baked goods, fruit, pork, lamb, chicken, carrots Cloves-Fruit, baked goods, fish, pot roast, green beans, beets, carrots Coriander-Pastry, cookies, meat, salads, cheese (lemon-orange flavor) Cumin-Meatloaf, fish,cheese, eggs, cabbage,fruit pie (caraway flavor) [employer], fruit, eggs, fish, poultry, cottage cheese, vegetables Dill Seed-Meat, cottage cheese, poultry, vegetables, fish, salads, bread Fennel Seed-Bread, cookies, apples, pork, eggs, fish, beets, cabbage, cheese, Licorice-like flavor Garlic-(buds or powder) Salads, meat, poultry, fish, bread, butter, vegetables, potatoes.Do not  use garlic salt Ginger-Fruit, vegetables, baked goods, meat, fish, poultry Horseradish Root-Meet, vegetables, butter Lemon Juice or Extract-Vegetables, fruit, tea, baked goods, fish salads Mace-Baked goods fruit, vegetables, fish, poultry (taste like nutmeg) Maple Extract-Syrups Marjoram-Meat, chicken, fish, vegetables, breads, green salads (taste like Sage) Mint-Tea, lamb, sherbet, vegetables, desserts, carrots, cabbage Mustard, Dry or Seed-Cheese, eggs, meats, vegetables, poultry Nutmeg-Baked goods, fruit, chicken, eggs, vegetables, desserts Onion Powder-Meat, fish, poultry, vegetables, cheese, eggs, bread, rice salads (Do not use   Onion salt) Orange Extract-Desserts, baked goods Oregano-Pasta, eggs, cheese, onions, pork, lamb, fish, chicken, vegetables, green salads Paprika-Meat, fish, poultry, eggs, cheese, vegetables Parsley Flakes-Butter, vegetables, meat fish, poultry, eggs, bread, salads (certain forms may   Contain sodium Pepper-Meat fish, poultry, vegetables, eggs Peppermint Extract-Desserts, baked goods Poppy Seed-Eggs, bread, cheese, fruit dressings, baked goods, noodles, vegetables, cottage  [employer], poultry, meat, fish, cauliflower, turnips,eggs bread Saffron-Rice, bread, veal, chicken, fish, eggs Sage-Meat, fish, poultry, onions, eggplant, tomateos, pork, stews Savory-Eggs, salads, poultry, meat, rice, vegetables, soups, pork Tarragon-Meat, poultry, fish, eggs, butter, vegetables (licorice-like flavor)  Thyme-Meat, poultry, fish, eggs, vegetables, (clover-like flavor), sauces,  soups Tumeric-Salads, butter, eggs, fish, rice, vegetables (saffron-like flavor) Vanilla Extract-Baked goods, candy Vinegar-Salads, vegetables, meat marinades Walnut Extract-baked goods, candy  2. Choose your Foods Wisely   The following is a list of foods to avoid which are high in sodium:  Meats-Avoid all smoked, canned, salt cured, dried and kosher meat and fish as well as Anchovies   [employer] meats:Bologna, Liverwurst, Pastrami Canned meat or fish  Marinated herring Caviar    Pepperoni Corned Beef   Pizza Dried chipped beef  Salami Frozen breaded fish or meat Salt pork Frankfurters or hot dogs  Sardines Gefilte fish   Sausage Ham (boiled ham, Proscuitto Smoked butt    spiced ham)   Spam      TV Dinners Vegetables Canned vegetables (Regular) Relish Canned mushrooms  Sauerkraut Olives    Tomato juice Pickles  Bakery and Dessert Products Canned puddings  Cream pies Cheesecake   Decorated cakes Cookies  Beverages/Juices Tomato juice, regular  Gatorade   V-8 vegetable juice, regular  Breads and Cereals Biscuit mixes   Salted potato chips, corn chips, pretzels Bread stuffing mixes  Salted crackers and rolls Pancake and waffle mixes Self-rising flour  Seasonings Accent    Meat sauces Barbecue sauce  Meat tenderizer Catsup    Monosodium glutamate (MSG) Celery salt   Onion  salt Chili sauce   Prepared mustard Garlic salt   Salt, seasoned salt, sea salt Gravy mixes   Soy sauce Horseradish   Steak sauce Ketchup   Tartar sauce Lite salt    Teriyaki sauce Marinade mixes   Worcestershire sauce  Others Baking powder   Cocoa and cocoa mixes Baking soda   Commercial casserole mixes Candy-caramels, chocolate  Dehydrated soups    Bars, fudge,nougats  Instant rice and pasta mixes Canned broth or soup  Maraschino cherries Cheese, aged and processed cheese and cheese spreads  Learning Assessment Quiz  Indicated T (for True) or F (for False) for each of the  following statements:  1. _____ Fresh fruits and vegetables and unprocessed grains are generally low in sodium 2. _____ Water may contain a considerable amount of sodium, depending on the source 3. _____ You can always tell if a food is high in sodium by tasting it 4. _____ Certain laxatives my be high in sodium and should be avoided unless prescribed   by a physician or pharmacist 5. _____ Salt substitutes may be used freely by anyone on a sodium restricted diet 6. _____ Sodium is present in table salt, food additives and as a natural component of   most foods 7. _____ Table salt is approximately 90% sodium 8. _____ Limiting sodium intake may help prevent excess fluid accumulation in the body 9. _____ On a sodium-restricted diet, seasonings such as bouillon soy sauce, and    cooking wine should be used in place of table salt 10. _____ On an ingredient list, a product which lists monosodium glutamate as the first   ingredient is an appropriate food to include on a low sodium diet  Circle the best answer(s) to the following statements (Hint: there may be more than one correct answer)  11. On a low-sodium diet, some acceptable snack items are:    A. Olives  F. Bean dip   K. Grapefruit juice    B. Salted Pretzels G. Commercial Popcorn   L. Canned peaches    C. Carrot Sticks  H. Bouillon   M. Unsalted nuts   D. [place] fries  I. Peanut butter crackers N. Salami   E. Sweet pickles J. Tomato Juice   O. Pizza  12.  Seasonings that may be used freely on a reduced - sodium diet include   A. Lemon wedges F.Monosodium glutamate K. Celery seed    B.Soysauce   G. Pepper   L. Mustard powder   C. Sea salt  H. Cooking wine  M. Onion flakes   D. Vinegar  E. Prepared horseradish N. Salsa   E. Sage   J. Worcestershire sauce  O. Chutney       Signed, Ermalinda Barrios, PA-C  09/11/2018 11:48 AM    [facility] [place], [place], [place]  [address] Phone: [phone]; Fax: [phone]

## 2018-09-11 NOTE — Patient Instructions (Signed)
Medication Instructions:  START: Hydrochlorothiazide 25 MG daily  If you need a refill on your cardiac medications before your next appointment, please call your pharmacy.   Lab work: TODAY: BMET  If you have labs (blood work) drawn today and your tests are completely normal, you will receive your results only by: Marland Kitchen MyChart Message (if you have MyChart) OR . A paper copy in the mail If you have any lab test that is abnormal or we need to change your treatment, we will call you to review the results.  Testing/Procedures: None  Follow-Up: Keep follow up appointment with Richardson Dopp PA-C on 10/01/18 @ 10:15 AM  Any Other Special Instructions Will Be Listed Below (If Applicable). Two Gram Sodium Diet 2000 mg  What is Sodium? Sodium is a mineral found naturally in many foods. The most significant source of sodium in the diet is table salt, which is about 40% sodium.  Processed, convenience, and preserved foods also contain a large amount of sodium.  The body needs only 500 mg of sodium daily to function,  A normal diet provides more than enough sodium even if you do not use salt.  Why Limit Sodium? A build up of sodium in the body can cause thirst, increased blood pressure, shortness of breath, and water retention.  Decreasing sodium in the diet can reduce edema and risk of heart attack or stroke associated with high blood pressure.  Keep in mind that there are many other factors involved in these health problems.  Heredity, obesity, lack of exercise, cigarette smoking, stress and what you eat all play a role.  General Guidelines:  Do not add salt at the table or in cooking.  One teaspoon of salt contains over 2 grams of sodium.  Read food labels  Avoid processed and convenience foods  Ask your dietitian before eating any foods not dicussed in the menu planning guidelines  Consult your physician if you wish to use a salt substitute or a sodium containing medication such as antacids.   Limit milk and milk products to 16 oz (2 cups) per day.  Shopping Hints:  READ LABELS!! "Dietetic" does not necessarily mean low sodium.  Salt and other sodium ingredients are often added to foods during processing.   Menu Planning Guidelines Food Group Choose More Often Avoid  Beverages (see also the milk group All fruit juices, low-sodium, salt-free vegetables juices, low-sodium carbonated beverages Regular vegetable or tomato juices, commercially softened water used for drinking or cooking  Breads and Cereals Enriched white, wheat, rye and pumpernickel bread, hard rolls and dinner rolls; muffins, cornbread and waffles; most dry cereals, cooked cereal without added salt; unsalted crackers and breadsticks; low sodium or homemade bread crumbs Bread, rolls and crackers with salted tops; quick breads; instant hot cereals; pancakes; commercial bread stuffing; self-rising flower and biscuit mixes; regular bread crumbs or cracker crumbs  Desserts and Sweets Desserts and sweets mad with mild should be within allowance Instant pudding mixes and cake mixes  Fats Butter or margarine; vegetable oils; unsalted salad dressings, regular salad dressings limited to 1 Tbs; light, sour and heavy cream Regular salad dressings containing bacon fat, bacon bits, and salt pork; snack dips made with instant soup mixes or processed cheese; salted nuts  Fruits Most fresh, frozen and canned fruits Fruits processed with salt or sodium-containing ingredient (some dried fruits are processed with sodium sulfites        Vegetables Fresh, frozen vegetables and low- sodium canned vegetables Regular canned vegetables, sauerkraut,  pickled vegetables, and others prepared in brine; frozen vegetables in sauces; vegetables seasoned with ham, bacon or salt pork  Condiments, Sauces, Miscellaneous  Salt substitute with physician's approval; pepper, herbs, spices; vinegar, lemon or lime juice; hot pepper sauce; garlic powder, onion  powder, low sodium soy sauce (1 Tbs.); low sodium condiments (ketchup, chili sauce, mustard) in limited amounts (1 tsp.) fresh ground horseradish; unsalted tortilla chips, pretzels, potato chips, popcorn, salsa (1/4 cup) Any seasoning made with salt including garlic salt, celery salt, onion salt, and seasoned salt; sea salt, rock salt, kosher salt; meat tenderizers; monosodium glutamate; mustard, regular soy sauce, barbecue, sauce, chili sauce, teriyaki sauce, steak sauce, Worcestershire sauce, and most flavored vinegars; canned gravy and mixes; regular condiments; salted snack foods, olives, picles, relish, horseradish sauce, catsup   Food preparation: Try these seasonings Meats:    Pork Sage, onion Serve with applesauce  Chicken Poultry seasoning, thyme, parsley Serve with cranberry sauce  Lamb Curry powder, rosemary, garlic, thyme Serve with mint sauce or jelly  Veal Marjoram, basil Serve with current jelly, cranberry sauce  Beef Pepper, bay leaf Serve with dry mustard, unsalted chive butter  Fish [place] leaf, dill Serve with unsalted lemon butter, unsalted parsley butter  Vegetables:    Asparagus Lemon juice   Broccoli Lemon juice   Carrots Mustard dressing parsley, mint, nutmeg, glazed with unsalted butter and sugar   Green beans Marjoram, lemon juice, nutmeg,dill seed   Tomatoes Basil, marjoram, onion   Spice /blend for [employer]" 4 tsp ground thyme 1 tsp ground sage 3 tsp ground rosemary 4 tsp ground marjoram   Test your knowledge 1. A product that says "Salt Free" may still contain sodium. True or False 2. Garlic Powder and Hot Pepper Sauce an be used as alternative seasonings.True or False 3. Processed foods have more sodium than fresh foods.  True or False 4. Canned Vegetables have less sodium than froze True or False  WAYS TO DECREASE YOUR SODIUM INTAKE 1. Avoid the use of added salt in cooking and at the table.  Table salt (and other prepared seasonings which contain salt) is  probably one of the greatest sources of sodium in the diet.  Unsalted foods can gain flavor from the sweet, sour, and butter taste sensations of herbs and spices.  Instead of using salt for seasoning, try the following seasonings with the foods listed.  Remember: how you use them to enhance natural food flavors is limited only by your creativity... Allspice-Meat, fish, eggs, fruit, peas, red and yellow vegetables Almond Extract-Fruit baked goods Anise Seed-Sweet breads, fruit, carrots, beets, cottage cheese, cookies (tastes like licorice) Basil-Meat, fish, eggs, vegetables, rice, vegetables salads, soups, sauces Bay Leaf-Meat, fish, stews, poultry Burnet-Salad, vegetables (cucumber-like flavor) Caraway Seed-Bread, cookies, cottage cheese, meat, vegetables, cheese, rice Cardamon-Baked goods, fruit, soups Celery Powder or seed-Salads, salad dressings, sauces, meatloaf, soup, bread.Do not use  celery salt Chervil-Meats, salads, fish, eggs, vegetables, cottage cheese (parsley-like flavor) Chili Power-Meatloaf, chicken cheese, corn, eggplant, egg dishes Chives-Salads cottage cheese, egg dishes, soups, vegetables, sauces Cilantro-Salsa, casseroles Cinnamon-Baked goods, fruit, pork, lamb, chicken, carrots Cloves-Fruit, baked goods, fish, pot roast, green beans, beets, carrots Coriander-Pastry, cookies, meat, salads, cheese (lemon-orange flavor) Cumin-Meatloaf, fish,cheese, eggs, cabbage,fruit pie (caraway flavor) [employer], fruit, eggs, fish, poultry, cottage cheese, vegetables Dill Seed-Meat, cottage cheese, poultry, vegetables, fish, salads, bread Fennel Seed-Bread, cookies, apples, pork, eggs, fish, beets, cabbage, cheese, Licorice-like flavor Garlic-(buds or powder) Salads, meat, poultry, fish, bread, butter, vegetables, potatoes.Do not  use garlic salt  Ginger-Fruit, vegetables, baked goods, meat, fish, poultry Horseradish Root-Meet, vegetables, butter Lemon Juice or Extract-Vegetables,  fruit, tea, baked goods, fish salads Mace-Baked goods fruit, vegetables, fish, poultry (taste like nutmeg) Maple Extract-Syrups Marjoram-Meat, chicken, fish, vegetables, breads, green salads (taste like Sage) Mint-Tea, lamb, sherbet, vegetables, desserts, carrots, cabbage Mustard, Dry or Seed-Cheese, eggs, meats, vegetables, poultry Nutmeg-Baked goods, fruit, chicken, eggs, vegetables, desserts Onion Powder-Meat, fish, poultry, vegetables, cheese, eggs, bread, rice salads (Do not use   Onion salt) Orange Extract-Desserts, baked goods Oregano-Pasta, eggs, cheese, onions, pork, lamb, fish, chicken, vegetables, green salads Paprika-Meat, fish, poultry, eggs, cheese, vegetables Parsley Flakes-Butter, vegetables, meat fish, poultry, eggs, bread, salads (certain forms may   Contain sodium Pepper-Meat fish, poultry, vegetables, eggs Peppermint Extract-Desserts, baked goods Poppy Seed-Eggs, bread, cheese, fruit dressings, baked goods, noodles, vegetables, cottage  [employer], poultry, meat, fish, cauliflower, turnips,eggs bread Saffron-Rice, bread, veal, chicken, fish, eggs Sage-Meat, fish, poultry, onions, eggplant, tomateos, pork, stews Savory-Eggs, salads, poultry, meat, rice, vegetables, soups, pork Tarragon-Meat, poultry, fish, eggs, butter, vegetables (licorice-like flavor)  Thyme-Meat, poultry, fish, eggs, vegetables, (clover-like flavor), sauces, soups Tumeric-Salads, butter, eggs, fish, rice, vegetables (saffron-like flavor) Vanilla Extract-Baked goods, candy Vinegar-Salads, vegetables, meat marinades Walnut Extract-baked goods, candy  2. Choose your Foods Wisely   The following is a list of foods to avoid which are high in sodium:  Meats-Avoid all smoked, canned, salt cured, dried and kosher meat and fish as well as Anchovies   [employer] meats:Bologna, Liverwurst, Pastrami Canned meat or fish  Marinated  herring Caviar    Pepperoni Corned Beef   Pizza Dried chipped beef  Salami Frozen breaded fish or meat Salt pork Frankfurters or hot dogs  Sardines Gefilte fish   Sausage Ham (boiled ham, Proscuitto Smoked butt    spiced ham)   Spam      TV Dinners Vegetables Canned vegetables (Regular) Relish Canned mushrooms  Sauerkraut Olives    Tomato juice Pickles  Bakery and Dessert Products Canned puddings  Cream pies Cheesecake   Decorated cakes Cookies  Beverages/Juices Tomato juice, regular  Gatorade   V-8 vegetable juice, regular  Breads and Cereals Biscuit mixes   Salted potato chips, corn chips, pretzels Bread stuffing mixes  Salted crackers and rolls Pancake and waffle mixes Self-rising flour  Seasonings Accent    Meat sauces Barbecue sauce  Meat tenderizer Catsup    Monosodium glutamate (MSG) Celery salt   Onion salt Chili sauce   Prepared mustard Garlic salt   Salt, seasoned salt, sea salt Gravy mixes   Soy sauce Horseradish   Steak sauce Ketchup   Tartar sauce Lite salt    Teriyaki sauce Marinade mixes   Worcestershire sauce  Others Baking powder   Cocoa and cocoa mixes Baking soda   Commercial casserole mixes Candy-caramels, chocolate  Dehydrated soups    Bars, fudge,nougats  Instant rice and pasta mixes Canned broth or soup  Maraschino cherries Cheese, aged and processed cheese and cheese spreads  Learning Assessment Quiz  Indicated T (for True) or F (for False) for each of the following statements:  1. _____ Fresh fruits and vegetables and unprocessed grains are generally low in sodium 2. _____ Water may contain a considerable amount of sodium, depending on the source 3. _____ You can always tell if a food is high in sodium by tasting it 4. _____ Certain laxatives my be high in sodium and should be avoided unless prescribed   by a physician or pharmacist 5.  _____ Salt substitutes may be used freely by anyone on a sodium restricted diet 6. _____ Sodium is  present in table salt, food additives and as a natural component of   most foods 7. _____ Table salt is approximately 90% sodium 8. _____ Limiting sodium intake may help prevent excess fluid accumulation in the body 9. _____ On a sodium-restricted diet, seasonings such as bouillon soy sauce, and    cooking wine should be used in place of table salt 10. _____ On an ingredient list, a product which lists monosodium glutamate as the first   ingredient is an appropriate food to include on a low sodium diet  Circle the best answer(s) to the following statements (Hint: there may be more than one correct answer)  11. On a low-sodium diet, some acceptable snack items are:    A. Olives  F. Bean dip   K. Grapefruit juice    B. Salted Pretzels G. Commercial Popcorn   L. Canned peaches    C. Carrot Sticks  H. Bouillon   M. Unsalted nuts   D. [place] fries  I. Peanut butter crackers N. Salami   E. Sweet pickles J. Tomato Juice   O. Pizza  12.  Seasonings that may be used freely on a reduced - sodium diet include   A. Lemon wedges F.Monosodium glutamate K. Celery seed    B.Soysauce   G. Pepper   L. Mustard powder   C. Sea salt  H. Cooking wine  M. Onion flakes   D. Vinegar  E. Prepared horseradish N. Salsa   E. Sage   J. Worcestershire sauce  O. Chutney

## 2018-09-12 ENCOUNTER — Other Ambulatory Visit: Payer: Medicare Other

## 2018-09-24 ENCOUNTER — Other Ambulatory Visit: Payer: Self-pay | Admitting: Cardiovascular Disease

## 2018-09-24 MED ORDER — DILTIAZEM HCL ER COATED BEADS 300 MG PO CP24: 300.0000 mg | ORAL_CAPSULE | Freq: Every day | ORAL | 3 refills | Status: DC

## 2018-10-01 ENCOUNTER — Telehealth: Payer: Self-pay

## 2018-10-01 ENCOUNTER — Encounter: Payer: Self-pay | Admitting: Physician Assistant

## 2018-10-01 ENCOUNTER — Ambulatory Visit: Payer: Medicare Other | Admitting: Physician Assistant

## 2018-10-01 VITALS — BP 130/62 | HR 50 | Ht 66.0 in | Wt 191.8 lb

## 2018-10-01 DIAGNOSIS — I4819 Other persistent atrial fibrillation: Secondary | ICD-10-CM | POA: Diagnosis not present

## 2018-10-01 DIAGNOSIS — I10 Essential (primary) hypertension: Secondary | ICD-10-CM | POA: Diagnosis not present

## 2018-10-01 DIAGNOSIS — R001 Bradycardia, unspecified: Secondary | ICD-10-CM

## 2018-10-01 LAB — CBC
Hematocrit: 38.9 % (ref 34.0–46.6)
Hemoglobin: 12.9 g/dL (ref 11.1–15.9)
MCH: 31.5 pg (ref 26.6–33.0)
MCHC: 33.2 g/dL (ref 31.5–35.7)
MCV: 95 fL (ref 79–97)
PLATELETS: 170 10*3/uL (ref 150–450)
RBC: 4.09 x10E6/uL (ref 3.77–5.28)
RDW: 12.5 % (ref 12.3–15.4)
WBC: 4.6 10*3/uL (ref 3.4–10.8)

## 2018-10-01 LAB — BASIC METABOLIC PANEL
BUN / CREAT RATIO: 18 (ref 12–28)
BUN: 18 mg/dL (ref 8–27)
CHLORIDE: 102 mmol/L (ref 96–106)
CO2: 21 mmol/L (ref 20–29)
CREATININE: 1.01 mg/dL — AB (ref 0.57–1.00)
Calcium: 9.4 mg/dL (ref 8.7–10.3)
GFR calc Af Amer: 59 mL/min/{1.73_m2} — ABNORMAL LOW (ref >59)
GFR calc non Af Amer: 51 mL/min/{1.73_m2} — ABNORMAL LOW (ref >59)
GLUCOSE: 147 mg/dL — AB (ref 65–99)
POTASSIUM: 4.3 mmol/L (ref 3.5–5.2)
SODIUM: 140 mmol/L (ref 134–144)

## 2018-10-01 NOTE — Progress Notes (Signed)
Cardiology Office Note:    Date:  10/01/2018   ID:  [name], DOB 02/15/1931, MRN [id number]  PCP:  Lavone Orn, MD  Cardiologist:  Sherren Mocha, MD   Electrophysiologist:  None   Referring MD: Lavone Orn, MD   Chief Complaint  Patient presents with  . Follow-up    AFib, HTN     History of Present Illness:    [name] is a 84 y.o. female with persistent atrial fibrillation status post multiple cardioversions, tachycardia induced cardiomyopathy.  Her last cardioversion was in December 2018.  She has been maintained on amiodarone for rhythm control.  EF in November 2018 was 40-45% by echocardiogram.  This returned to normal by most recent echocardiogram in June 2019.  I saw her in follow up 08/27/18.  Her Diltiazem was reduced due to bradycardia.  She was placed on Losartan for blood pressure.  Her blood pressure remained elevated and she saw Gerrianne Scale, PA-C 09/11/18.  HCTZ was added to her medical regimen.     [name] returns for follow-up.  She is here alone.  She is feeling much better.  She denies chest discomfort, syncope, paroxysmal nocturnal dyspnea, lower extremity swelling.  Her fatigue is improved.  She does note shortness of breath with some activities.  This is overall chronic without significant change.  She denies any bleeding issues.  Prior CV studies:   The following studies were reviewed today:  Echocardiogram 04/24/2018 Moderate concentric LVH, EF 65-70, normal wall motion, grade 2 diastolic dysfunction, mild AI, mild MR, moderate LAE, mild RAE, mild TR, PASP 40  Echocardiogram 10/11/2017 Moderate LVH, severe focal basal hypertrophy of the septum to EF 40-45, diffuse HK, aortic sclerosis without stenosis, MAC, severe LAE, moderate TR, PASP 33  TEE (03/2014):  No LAA clot, moderate LAE, core triatriatum type structure in RA with no stenosis, normal EF 55-60%, mild MR   Past Medical History:  Diagnosis Date  . Acute bronchitis 11/15/2017    . ADENOCARCINOMA, BREAST 10/13/2010   Qualifier: Diagnosis of  By: Nils Pyle CMA (AAMA), Mearl Latin    . 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 Allergy    SEASONAL  . Anemia    years ago  . ANEMIA, IRON DEFICIENCY 10/14/2010   Qualifier: Diagnosis of  By: Sharlett Iles MD Byrd Hesselbach   . Anemia, unspecified 10/13/2010   Qualifier: Diagnosis of  By: Nils Pyle CMA ([place]), Mearl Latin    . Anticoagulated by anticoagulation treatment - Eliquis 03/30/2014   Started May 2015   . Anxiety state 10/13/2010   Qualifier: Diagnosis of  By: Nils Pyle CMA ([place]), Mearl Latin    . Arthritis    "all my joints" (03/31/2014)  . Atrial fibrillation ([place])   . Atrial fibrillation with RVR ([place]) 03/31/2015   DLCO dropped from 100% in 2015-50% in 01/2017  . Barrett's esophagus   . Breast cancer [facility])    s/p right breast lumpectomy  . CARDIAC MURMUR 10/13/2010   Qualifier: Diagnosis of  By: Nils Pyle CMA ([place]), Mearl Latin    . 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 Cervical cancer ([place])   . Chronic cough 01/02/2017   GERD, hiatal hernia [place] amio  . COLONIC POLYPS, ADENOMATOUS, HX OF 10/13/2010   Qualifier: Diagnosis of  By: Nils Pyle CMA (AAMA), Mearl Latin    . Degenerative disorder of eye 07/25/2012  . Diabetes mellitus without complication ([place])   . Dilated cardiomyopathy ([place]) 08/27/2018  . DIVERTICULOSIS, COLON 10/13/2010   Qualifier: Diagnosis of  By: Nils Pyle CMA (  AAMA), Mearl Latin    . DJD (degenerative joint disease) of knee   . Dysrhythmia    Afib  . EROSIVE ESOPHAGITIS 10/13/2010   Qualifier: Diagnosis of  By: Nils Pyle CMA ([place]), Mearl Latin    . Error, refractive, myopia 07/09/2012  . Essential hypertension 10/13/2010   Qualifier: Diagnosis of  By: Nils Pyle CMA ([place]), Mearl Latin    . Exotropia, intermittent 07/09/2012  . GERD 10/14/2010   Qualifier: Diagnosis of  By: Sharlett Iles MD Byrd Hesselbach GERD (gastroesophageal reflux disease)   . Glaucoma   . H/O hiatal hernia   . Heart murmur   . Hiatal hernia s/p robotic repair &  fundoplication 1/79/1505 69/05/9479   Qualifier: Diagnosis of  By: Nils Pyle CMA (AAMA), Mearl Latin    . History of laser assisted in situ keratomileusis 07/25/2012  . Hx of transesophageal echocardiography (TEE) for monitoring    TEE (03/2014): No LAA clot, moderate LAE, core triatriatum type structure in RA with no stenosis, normal EF 55-60%, mild MR  . HYPERCHOLESTEROLEMIA 10/13/2010   Qualifier: Diagnosis of  By: Nils Pyle CMA ([place]), Mearl Latin    . Hypertension   . Hypothyroidism, postsurgical   . Irritable bowel syndrome 10/13/2010   Qualifier: Diagnosis of  By: Nils Pyle CMA (AAMA), Mearl Latin    . Lumbar stenosis with neurogenic claudication 04/20/2016  . Malignant neoplasm of breast ([place]) 02/25/2016  . Migraine    "last one was years ago" (03/31/2014)  . MIGRAINE HEADACHE 10/13/2010   Qualifier: Diagnosis of  By: Nils Pyle CMA ([place]), Mearl Latin    . 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 OSA on CPAP    settings at 10-11   . OSTEOARTHRITIS 10/13/2010   Qualifier: Diagnosis of  By: Nils Pyle CMA ([place]), Mearl Latin    . Post-surgical hypothyroidism 04/09/2014  . RECTAL FISSURE 10/13/2010   Qualifier: Diagnosis of  By: Nils Pyle CMA ([place]), Mearl Latin    . S/P Nissen fundoplication (without gastrostomy tube) procedure 05/25/2017  . Sleep apnea   . THYROID CANCER 10/13/2010   Qualifier: Diagnosis of  By: Nils Pyle CMA ([place]), Mearl Latin    . Thyroid cancer [facility])    s/p thyroidectomy  . Type II diabetes mellitus ([place])    type II    Surgical Hx: The patient  has a past surgical history that includes Thyroidectomy; TEE without cardioversion (N/A, 03/30/2014); Cardioversion (N/A, 03/30/2014); Tonsillectomy; Excisional hemorrhoidectomy; Carpal tunnel release (Right); Abdominal hysterectomy; Cataract extraction w/ intraocular lens  implant, bilateral (Bilateral); Breast biopsy (Right); Breast lumpectomy (Right); Cardioversion (N/A, 03/31/2014); Cardioversion (N/A, 04/27/2014); Cardioversion (N/A, 04/13/2015); Colonoscopy; Cardioversion  (N/A, 01/08/2017); Insertion of mesh (N/A, 05/25/2017); Cardioversion (N/A, 09/19/2017); and Cardioversion (N/A, 10/25/2017).   Current Medications: Current Meds  Medication Sig  . acetaminophen (TYLENOL) 650 MG CR tablet Take 650 mg by mouth every 8 (eight) hours as needed for pain.   Marland Kitchen amiodarone (PACERONE) 200 MG tablet TAKE 1 TABLET BY MOUTH  DAILY  . diltiazem (CARDIZEM CD) 300 MG 24 hr capsule Take 1 capsule (300 mg total) by mouth daily.  Marland Kitchen ELIQUIS 5 MG TABS tablet TAKE 1 TABLET BY MOUTH TWO  TIMES DAILY  . ferrous sulfate 325 (65 FE) MG tablet Take 325 mg by mouth daily with breakfast.  . fluticasone (FLONASE) 50 MCG/ACT nasal spray Place 1 spray into both nostrils at bedtime.   . hydrochlorothiazide (HYDRODIURIL) 25 MG tablet Take 1 tablet (25 mg total) by mouth daily.  . lansoprazole (PREVACID) 30 MG capsule Take 30 mg by mouth as  directed.  Marland Kitchen levothyroxine (SYNTHROID, LEVOTHROID) 175 MCG tablet Take 175 mcg by mouth daily before breakfast.  . LORazepam (ATIVAN) 1 MG tablet Take 1.5 mg by mouth at bedtime.  Marland Kitchen losartan (COZAAR) 50 MG tablet Take 1 tablet (50 mg total) by mouth daily.  . Lutein 6 MG CAPS Take 6 mg by mouth daily.   . metFORMIN (GLUMETZA) 500 MG (MOD) 24 hr tablet Take 500 mg by mouth 2 (two) times daily with a meal.   . metoprolol tartrate (LOPRESSOR) 25 MG tablet Take 0.5 tablets (12.5 mg total) by mouth 2 (two) times daily.  . Multiple Vitamins-Minerals (ICAPS AREDS 2) CAPS Take 1 capsule by mouth 2 (two) times daily.   . Polyvinyl Alcohol-Povidone PF (REFRESH) 1.4-0.6 % SOLN Place 1 drop into both eyes at bedtime.  . potassium chloride (KLOR-CON) 20 MEQ packet Take 20 mEq by mouth daily.   . rosuvastatin (CRESTOR) 10 MG tablet Take 10 mg by mouth at bedtime.  . sertraline (ZOLOFT) 50 MG tablet Take 50 mg by mouth daily.  . vitamin B-12 (CYANOCOBALAMIN) 500 MCG tablet Take 500 mcg by mouth daily as needed (energy).     Allergies:   Penicillins   Social History    Tobacco Use  . Smoking status: Never Smoker  . Smokeless tobacco: Never Used  Substance Use Topics  . Alcohol use: No    Alcohol/week: 0.0 standard drinks  . Drug use: No     Family Hx: The patient's family history includes Diabetes in her brother, brother, brother, brother, brother, brother, brother, sister, sister, and sister; Heart attack in her brother; Heart disease in her brother, brother, brother, brother, brother, brother, and sister; Pancreatic cancer in her sister; Prostate cancer in her father.  ROS:   Please see the history of present illness.    Review of Systems  Respiratory: Positive for wheezing.   Musculoskeletal: Positive for joint pain.  Neurological: Positive for loss of balance.   All other systems reviewed and are negative.   EKGs/Labs/Other Test Reviewed:    EKG:  EKG is  ordered today.  The ekg ordered today demonstrates sinus bradycardia, heart rate 50, normal axis, J-point elevation, QTC 463, similar to old EKGs  Recent Labs: 10/18/2017: Hemoglobin 13.9; Platelets 186 04/22/2018: ALT 11; TSH 0.580 09/11/2018: BUN 21; Creatinine, Ser 0.88; Potassium 4.1; Sodium 142   Recent Lipid Panel No results found for: CHOL, TRIG, HDL, CHOLHDL, LDLCALC, LDLDIRECT  Physical Exam:    VS:  BP 130/62   Pulse (!) 50   Ht 5' 6"  (1.676 m)   Wt 191 lb 12.8 oz (87 kg)   LMP  (LMP Unknown)   SpO2 96%   BMI 30.96 kg/m     Wt Readings from Last 3 Encounters:  10/01/18 191 lb 12.8 oz (87 kg)  09/11/18 192 lb 12.8 oz (87.5 kg)  08/27/18 190 lb 12.8 oz (86.5 kg)     Physical Exam  Constitutional: She is oriented to person, place, and time. She appears well-developed and well-nourished. No distress.  HENT:  Head: Normocephalic and atraumatic.  Eyes: No scleral icterus.  Neck: No thyromegaly present.  Cardiovascular: Normal rate and regular rhythm.  No murmur heard. Pulmonary/Chest: Effort normal. She has no rales.  Abdominal: Soft.  Musculoskeletal: She  exhibits no edema.  Neurological: She is alert and oriented to person, place, and time.  Skin: Skin is warm and dry.    ASSESSMENT & PLAN:    Persistent atrial fibrillation  Maintaining normal  sinus rhythm amiodarone, chiasm metoprolol.  She is tolerating anticoagulation with Apixaban.  Follow-up BMET, CBC today.  Essential hypertension  The patient's blood pressure is controlled on her current regimen.  Continue current therapy.  With the recent addition of HCTZ, I will obtain a follow-up BMET today.  Bradycardia Heart rate somewhat improved.  She is no longer having symptoms to suggest bradycardia.  Continue to monitor.   Dispo:  Return in about 6 months (around 04/01/2019) for Routine Follow Up, w/ [name], or Richardson Dopp, PA-C.   Medication Adjustments/Labs and Tests Ordered: Current medicines are reviewed at length with the patient today.  Concerns regarding medicines are outlined above.  Tests Ordered: Orders Placed This Encounter  Procedures  . Basic metabolic panel  . CBC  . EKG 12-Lead   Medication Changes: No orders of the defined types were placed in this encounter.   Signed, Richardson Dopp, PA-C  10/01/2018 11:11 AM    [facility] [address], [address], [place]  [address] Phone: [phone]; Fax: [phone]

## 2018-10-01 NOTE — Telephone Encounter (Signed)
Reviewed results with patient who verbalized understanding. Per Richardson Dopp PA-C to repeat bmet in a 2 weeks. Lab appt scheduled for 12/3. Order is placed in [place].

## 2018-10-01 NOTE — Addendum Note (Signed)
Addended by: Jacinta Shoe on: 10/01/2018 04:20 PM   Modules accepted: Orders

## 2018-10-01 NOTE — Patient Instructions (Addendum)
Medication Instructions:  No changes - continue your current medications.   If you need a refill on your cardiac medications before your next appointment, please call your pharmacy.   Lab work: Today - BMET, CBC  If you have labs (blood work) drawn today and your tests are completely normal, you will receive your results only by: Marland Kitchen MyChart Message (if you have MyChart) OR . A paper copy in the mail If you have any lab test that is abnormal or we need to change your treatment, we will call you to review the results.  Testing/Procedures: None   Follow-Up: At [facility], you and your health needs are our priority.  As part of our continuing mission to provide you with exceptional heart care, we have created designated Provider Care Teams.  These Care Teams include your primary Cardiologist (physician) and Advanced Practice Providers (APPs -  Physician Assistants and Nurse Practitioners) who all work together to provide you with the care you need, when you need it. You will need a follow up appointment in:  6 months.  Please call our office 2 months in advance to schedule this appointment.  You may see Sherren Mocha, MD or one of the following Advanced Practice Providers on your designated Care Team: Richardson Dopp, PA-C  Any Other Special Instructions Will Be Listed Below (If Applicable).

## 2018-10-15 ENCOUNTER — Other Ambulatory Visit: Payer: Self-pay | Admitting: Cardiovascular Disease

## 2018-10-15 ENCOUNTER — Other Ambulatory Visit: Payer: Medicare Other

## 2018-10-15 DIAGNOSIS — I4819 Other persistent atrial fibrillation: Secondary | ICD-10-CM

## 2018-10-15 DIAGNOSIS — I10 Essential (primary) hypertension: Secondary | ICD-10-CM

## 2018-10-15 LAB — BASIC METABOLIC PANEL
BUN / CREAT RATIO: 19 (ref 12–28)
BUN: 19 mg/dL (ref 8–27)
CO2: 20 mmol/L (ref 20–29)
CREATININE: 1 mg/dL (ref 0.57–1.00)
Calcium: 9.3 mg/dL (ref 8.7–10.3)
Chloride: 104 mmol/L (ref 96–106)
GFR calc Af Amer: 59 mL/min/{1.73_m2} — ABNORMAL LOW (ref >59)
GFR, EST NON AFRICAN AMERICAN: 51 mL/min/{1.73_m2} — AB (ref >59)
GLUCOSE: 91 mg/dL (ref 65–99)
Potassium: 4.3 mmol/L (ref 3.5–5.2)
SODIUM: 140 mmol/L (ref 134–144)

## 2018-10-16 NOTE — Telephone Encounter (Signed)
Eliquis 5mg  refill request received; pt is 84 yrs old, wt-87 kg, Crea-1.00 on 10/15/18, last seen by Richardson Dopp on 10/01/18; will send in refill to requested pharmacy.

## 2018-10-24 ENCOUNTER — Encounter: Payer: Self-pay | Admitting: Neurology

## 2018-10-24 NOTE — Progress Notes (Signed)
[name] was seen today in the movement disorders clinic for neurologic consultation at the request of Leeroy Cha,*.  The consultation is for the evaluation of gait abnormality.  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  She is seen for further evaluation of gait change.  Specific Symptoms:  Tremor: Yes.  , head "and my body." Family hx of similar:  No. Voice: deeper Sleep: "I have to take a sleeping pill"  Vivid Dreams:  No.  Acting out dreams:  No. Wet Pillows: Yes.   Postural symptoms:  Yes.  , "I go from side to side."  Falls?  Yes.  , fell trying to get out of the car about a month ago; with another she was working in the flowers and bending and fell forward.  With one other she was in the back yard and fell forward and had trouble getting up and had to pull up on the tree to get up.  She has paresthesias in the feet and "I have diabetes."  Has been diabetic for 15 years.  States that last A1c was 6.2.    Has grab bar in the shower and has to hold on to wash.  Gait has been gradually getting worse for the last few years.  Bradykinesia symptoms: difficulty getting out of a chair and difficulty regaining balance Loss of smell:  Yes.   Loss of taste:  Yes.   Urinary Incontinence:  Yes.   Difficulty Swallowing:  Yes.   N/V:  Yes.  , some nausea Lightheaded:  No.  Syncope: No. Diplopia:  No. Dyskinesia:  No. Prior exposure to reglan/antipsychotics: no.   Tremor inducing meds: yes, on amiodarone x 2 years  .  She had an MRI brain in 2009 for gait change and I personally reviewed this.  It was unremarkable, with the exception of some atrophy.   ALLERGIES:   Allergies  Allergen Reactions  . Penicillins Itching, Rash and Other (See Comments)    Has patient had a PCN reaction causing immediate rash, facial/tongue/throat swelling, SOB or lightheadedness with hypotension: Unknown, childhood reaction Has patient had  a PCN reaction causing severe rash involving mucus membranes or skin necrosis: No Has patient had a PCN reaction that required hospitalization No Has patient had a PCN reaction occurring within the last 10 years: No If all of the above answers are "NO", then may proceed with Cephalosporin use.     CURRENT MEDICATIONS:  Outpatient Encounter Medications as of 10/28/2018  Medication Sig  . acetaminophen (TYLENOL) 650 MG CR tablet Take 650 mg by mouth every 8 (eight) hours as needed for pain.   Marland Kitchen amiodarone (PACERONE) 200 MG tablet TAKE 1 TABLET BY MOUTH  DAILY  . diltiazem (CARDIZEM CD) 300 MG 24 hr capsule Take 1 capsule (300 mg total) by mouth daily.  Marland Kitchen ELIQUIS 5 MG TABS tablet TAKE 1 TABLET BY MOUTH TWO  TIMES DAILY  . ferrous sulfate 325 (65 FE) MG tablet Take 325 mg by mouth daily with breakfast.  . fluticasone (FLONASE) 50 MCG/ACT nasal spray Place 1 spray into both nostrils at bedtime.   . hydrochlorothiazide (HYDRODIURIL) 25 MG tablet Take 1 tablet (25 mg total) by mouth daily.  . lansoprazole (PREVACID) 30 MG capsule Take 30 mg by mouth as directed.  Marland Kitchen levothyroxine (SYNTHROID, LEVOTHROID) 175 MCG tablet Take 175 mcg by mouth daily before breakfast.  . LORazepam (ATIVAN) 1 MG tablet Take 1.5 mg by mouth  at bedtime.  Marland Kitchen losartan (COZAAR) 50 MG tablet Take 1 tablet (50 mg total) by mouth daily.  . Lutein 6 MG CAPS Take 6 mg by mouth daily.   . metFORMIN (GLUMETZA) 500 MG (MOD) 24 hr tablet Take 500 mg by mouth 2 (two) times daily with a meal.   . metoprolol tartrate (LOPRESSOR) 25 MG tablet Take 0.5 tablets (12.5 mg total) by mouth 2 (two) times daily.  . Multiple Vitamins-Minerals (ICAPS AREDS 2) CAPS Take 1 capsule by mouth 2 (two) times daily.   . Polyvinyl Alcohol-Povidone PF (REFRESH) 1.4-0.6 % SOLN Place 1 drop into both eyes at bedtime.  . potassium chloride (KLOR-CON) 20 MEQ packet Take 20 mEq by mouth daily.   . rosuvastatin (CRESTOR) 10 MG tablet Take 10 mg by mouth at  bedtime.  . sertraline (ZOLOFT) 50 MG tablet Take 50 mg by mouth daily.  . vitamin B-12 (CYANOCOBALAMIN) 500 MCG tablet Take 500 mcg by mouth daily as needed (energy).   No facility-administered encounter medications on file as of 10/28/2018.     PAST MEDICAL HISTORY:   Past Medical History:  Diagnosis Date  . Acute bronchitis 11/15/2017  . ADENOCARCINOMA, BREAST 10/13/2010   Qualifier: Diagnosis of  By: Nils Pyle CMA (AAMA), Mearl Latin    . 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 Allergy    SEASONAL  . Anemia    years ago  . ANEMIA, IRON DEFICIENCY 10/14/2010   Qualifier: Diagnosis of  By: Sharlett Iles MD Byrd Hesselbach   . Anemia, unspecified 10/13/2010   Qualifier: Diagnosis of  By: Nils Pyle CMA ([place]), Mearl Latin    . Anticoagulated by anticoagulation treatment - Eliquis 03/30/2014   Started May 2015   . Anxiety state 10/13/2010   Qualifier: Diagnosis of  By: Nils Pyle CMA ([place]), Mearl Latin    . Arthritis    "all my joints" (03/31/2014)  . Atrial fibrillation ([place])   . Atrial fibrillation with RVR ([place]) 03/31/2015   DLCO dropped from 100% in 2015-50% in 01/2017  . Barrett's esophagus   . Breast cancer [facility])    s/p right breast lumpectomy  . CARDIAC MURMUR 10/13/2010   Qualifier: Diagnosis of  By: Nils Pyle CMA ([place]), Mearl Latin    . 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 Cervical cancer ([place])   . Chronic cough 01/02/2017   GERD, hiatal hernia Doubt amio  . COLONIC POLYPS, ADENOMATOUS, HX OF 10/13/2010   Qualifier: Diagnosis of  By: Nils Pyle CMA (AAMA), Mearl Latin    . Degenerative disorder of eye 07/25/2012  . Diabetes mellitus without complication ([place])   . Dilated cardiomyopathy ([place]) 08/27/2018  . DIVERTICULOSIS, COLON 10/13/2010   Qualifier: Diagnosis of  By: Nils Pyle CMA (AAMA), Mearl Latin    . DJD (degenerative joint disease) of knee   . Dysrhythmia    Afib  . EROSIVE ESOPHAGITIS 10/13/2010   Qualifier: Diagnosis of  By: Nils Pyle CMA ([place]), Mearl Latin    . Error, refractive, myopia 07/09/2012  .  Essential hypertension 10/13/2010   Qualifier: Diagnosis of  By: Nils Pyle CMA ([place]), Mearl Latin    . Exotropia, intermittent 07/09/2012  . GERD 10/14/2010   Qualifier: Diagnosis of  By: Sharlett Iles MD Byrd Hesselbach GERD (gastroesophageal reflux disease)   . Glaucoma   . H/O hiatal hernia   . Heart murmur   . Hiatal hernia s/p robotic repair & fundoplication 2/42/6834 19/04/2228   Qualifier: Diagnosis of  By: Nils Pyle CMA (AAMA), Mearl Latin    . History of laser assisted in situ keratomileusis 07/25/2012  .  Hx of transesophageal echocardiography (TEE) for monitoring    TEE (03/2014): No LAA clot, moderate LAE, core triatriatum type structure in RA with no stenosis, normal EF 55-60%, mild MR  . HYPERCHOLESTEROLEMIA 10/13/2010   Qualifier: Diagnosis of  By: Nils Pyle CMA ([place]), Mearl Latin    . Hypertension   . Hypothyroidism, postsurgical   . Irritable bowel syndrome 10/13/2010   Qualifier: Diagnosis of  By: Nils Pyle CMA (AAMA), Mearl Latin    . Lumbar stenosis with neurogenic claudication 04/20/2016  . Malignant neoplasm of breast ([place]) 02/25/2016  . Migraine    "last one was years ago" (03/31/2014)  . MIGRAINE HEADACHE 10/13/2010   Qualifier: Diagnosis of  By: Nils Pyle CMA ([place]), Mearl Latin    . 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 OSA on CPAP    settings at 10-11   . OSTEOARTHRITIS 10/13/2010   Qualifier: Diagnosis of  By: Nils Pyle CMA ([place]), Mearl Latin    . Post-surgical hypothyroidism 04/09/2014  . RECTAL FISSURE 10/13/2010   Qualifier: Diagnosis of  By: Nils Pyle CMA ([place]), Mearl Latin    . S/P Nissen fundoplication (without gastrostomy tube) procedure 05/25/2017  . Sleep apnea   . THYROID CANCER 10/13/2010   Qualifier: Diagnosis of  By: Nils Pyle CMA ([place]), Mearl Latin    . Thyroid cancer [facility])    s/p thyroidectomy  . Type II diabetes mellitus ([place])    type II     PAST SURGICAL HISTORY:   Past Surgical History:  Procedure Laterality Date  . ABDOMINAL HYSTERECTOMY     "partial"  . BREAST BIOPSY Right   . BREAST  LUMPECTOMY Right   . CARDIOVERSION N/A 03/30/2014   Procedure: CARDIOVERSION - BEDSIDE;  Surgeon: [name], MD;  Location: [place];  Service: Cardiovascular;  Laterality: N/A;  . CARDIOVERSION N/A 03/31/2014   Procedure: CARDIOVERSION  (BEDSIDE) ;  Surgeon: Lelon Perla, MD;  Location: [place];  Service: Cardiovascular;  Laterality: N/A;  . CARDIOVERSION N/A 04/27/2014   Procedure: CARDIOVERSION;  Surgeon: Darlin Coco, MD;  Location: [place];  Service: Cardiovascular;  Laterality: N/A;  . CARDIOVERSION N/A 04/13/2015   Procedure: CARDIOVERSION;  Surgeon: Thayer Headings, MD;  Location: [place];  Service: Cardiovascular;  Laterality: N/A;  . CARDIOVERSION N/A 01/08/2017   Procedure: CARDIOVERSION;  Surgeon: Sanda Klein, MD;  Location[facility] END[facility]COPY;  Service: Cardiovascular;  Laterality: N/A;  . CARDIOVERSION N/A 09/19/2017   Procedure: CARDIOVERSION;  Surgeon: [name], MD;  Location: [facility] ENDOSCOPY;  Service: Cardiovascular;  Laterality: N/A;  . CARDIOVERSION N/A 10/25/2017   Procedure: CARDIOVERSION;  Surgeon: Acie Fredrickson Wonda Cheng, MD;  Location: [place];  Service: Cardiovascular;  Laterality: N/A;  . CARPAL TUNNEL RELEASE Right   . CATARACT EXTRACTION W/ INTRAOCULAR LENS  IMPLANT, BILATERAL Bilateral   . COLONOSCOPY    . EXCISIONAL HEMORRHOIDECTOMY    . INSERTION OF MESH N/A 05/25/2017   Procedure: INSERTION OF MESH;  Surgeon: Ralene Ok, MD;  Location: [facility];  Service: General;  Laterality: N/A;  . TEE WITHOUT CARDIOVERSION N/A 03/30/2014   Procedure: TRANSESOPHAGEAL ECHOCARDIOGRAM (TEE);  Surgeon: [name], MD;  Location: [facility] ENDOSCOPY;  Service: Cardiovascular;  Laterality: N/A;  . THYROIDECTOMY    . TONSILLECTOMY      SOCIAL HISTORY:   Social History   Socioeconomic History  . Marital status: Divorced    Spouse name: Not on file  . Number of children: 2  . Years of education: Not on file  . Highest education level: Not on  file  Occupational  History  . Occupation: Retired  Scientific laboratory technician  . Financial resource strain: Not on file  . Food insecurity:    Worry: Not on file    Inability: Not on file  . Transportation needs:    Medical: Not on file    Non-medical: Not on file  Tobacco Use  . Smoking status: Never Smoker  . Smokeless tobacco: Never Used  Substance and Sexual Activity  . Alcohol use: No    Alcohol/week: 0.0 standard drinks  . Drug use: No  . Sexual activity: Never  Lifestyle  . Physical activity:    Days per week: Not on file    Minutes per session: Not on file  . Stress: Not on file  Relationships  . Social connections:    Talks on phone: Not on file    Gets together: Not on file    Attends religious service: Not on file    Active member of club or organization: Not on file    Attends meetings of clubs or organizations: Not on file    Relationship status: Not on file  . Intimate partner violence:    Fear of current or ex partner: Not on file    Emotionally abused: Not on file    Physically abused: Not on file    Forced sexual activity: Not on file  Other Topics Concern  . Not on file  Social History Narrative  . Not on file    FAMILY HISTORY:   Family Status  Relation Name Status  . Mother  Deceased  . Father  Deceased  . Sister  Deceased  . Brother  Deceased  . Sister  Deceased  . Sister  Deceased  . Brother  Deceased  . Brother  Deceased  . Brother  Deceased  . Brother  Deceased  . Brother  Deceased  . Brother  Deceased  . MGM  Deceased  . MGF  Deceased  . PGM  Deceased  . PGF  Deceased  . Child twins Alive    ROS:  Review of Systems  Constitutional: Negative.   HENT: Negative.   Eyes: Positive for blurred vision.  Respiratory: Positive for shortness of breath.   Cardiovascular: Negative.   Gastrointestinal:       Occasional bowel incontinence  Musculoskeletal: Positive for falls.  Skin: Negative.   Endo/Heme/Allergies: Negative.   Psychiatric/Behavioral: Negative.      PHYSICAL EXAMINATION:    VITALS:   Vitals:   10/28/18 1241  BP: 116/64  Pulse: (!) 56  SpO2: 92%  Weight: 192 lb (87.1 kg)  Height: 5\' 6"  (1.676 m)    GEN:  The patient appears stated age and is in NAD. HEENT:  Normocephalic, atraumatic.  The mucous membranes are moist. The superficial temporal arteries are without ropiness or tenderness. CV:  RRR Lungs:  CTAB Neck/HEME:  There are no carotid bruits bilaterally.  Neurological examination:  Orientation: The patient is alert and oriented x3. Fund of knowledge is appropriate.  Recent and remote memory are intact.  Attention and concentration are normal.    Able to name objects and repeat phrases. Cranial nerves: There is good facial symmetry. Pupils are equal round and reactive to light bilaterally.Fundoscopic exam is attempted but the disc margins are not well visualized bilaterally.. Extraocular muscles are intact. The visual fields are full to confrontational testing. The speech is fluent and clear. Soft palate rises symmetrically and there is no tongue deviation. Hearing is intact to conversational tone.  Sensation: Sensation is intact to light and pinprick throughout (facial, trunk, extremities).  Pinprick is decreased in a stocking distribution.  Vibration is absent in the entire lower extremities.. There is no extinction with double simultaneous stimulation. There is no sensory dermatomal level identified. Motor: Strength is 5/5 in the bilateral upper and lower extremities.   Shoulder shrug is equal and symmetric.  There is no pronator drift. Deep tendon reflexes: Deep tendon reflexes are 2+/4 at the bilateral biceps, triceps, brachioradialis, patella and absent at the bilateral achilles. Plantar responses are downgoing bilaterally.  Movement examination: Tone: There is normal tone in the upper and lower extremities. Abnormal movements: None Coordination:  There is no decremation with RAM's, with any form of RAMS, including  alternating supination and pronation of the forearm, hand opening and closing, finger taps, heel taps and toe taps. Gait and Station: The patient pushes off of the chair to arise.  She is very wide-based and unbalanced.  There is a spastic component to the gait, but no spasticity is noted when tone is tested.        Chemistry      Component Value Date/Time   NA 140 10/15/2018 1106   K 4.3 10/15/2018 1106   CL 104 10/15/2018 1106   CO2 20 10/15/2018 1106   BUN 19 10/15/2018 1106   CREATININE 1.00 10/15/2018 1106   CREATININE 0.98 (H) 11/01/2016 1043      Component Value Date/Time   CALCIUM 9.3 10/15/2018 1106   ALKPHOS 77 04/22/2018 1521   AST 14 04/22/2018 1521   ALT 11 04/22/2018 1521   BILITOT 0.2 04/22/2018 1521     Lab Results  Component Value Date   TSH 0.580 04/22/2018   Lab Results  Component Value Date   WBC 4.6 10/01/2018   HGB 12.9 10/01/2018   HCT 38.9 10/01/2018   MCV 95 10/01/2018   PLT 170 10/01/2018   Lab Results  Component Value Date   VITAMINB12 543 10/14/2010   ASSESSMENT/PLAN:  1.  Gait instability  -There is evidence of diffuse peripheral neuropathy, likely secondary to her diabetes.  However, I am not sure that this is all that is going on.  She is quite hyperreflexic for the degree of neuropathy that appears to be present.  We will do some lab work, including B12, folate, RPR, SPEP/UPEP with immunofixation.  -She will have an EMG.  -We will do an MRI of the lumbar spine given her complaints of low back pain and prior lumbar spine surgery.  -If the above is negative, we will consider MRI of the cervical spine and/or brain.  Her neuro exam was nonfocal and nonlateralizing today.  -safety discussed in detail  2.  Further recommendations will follow the above testing.  Cc:  Lavone Orn, MD

## 2018-10-25 ENCOUNTER — Ambulatory Visit: Payer: Medicare Other | Admitting: Adult Health

## 2018-10-28 ENCOUNTER — Encounter: Payer: Self-pay | Admitting: Neurology

## 2018-10-28 ENCOUNTER — Ambulatory Visit: Payer: Medicare Other | Admitting: Adult Health

## 2018-10-28 ENCOUNTER — Other Ambulatory Visit (INDEPENDENT_AMBULATORY_CARE_PROVIDER_SITE_OTHER): Payer: Medicare Other

## 2018-10-28 ENCOUNTER — Ambulatory Visit: Payer: Medicare Other | Admitting: Neurology

## 2018-10-28 VITALS — BP 116/64 | HR 56 | Ht 66.0 in | Wt 192.0 lb

## 2018-10-28 DIAGNOSIS — M545 Low back pain, unspecified: Secondary | ICD-10-CM

## 2018-10-28 DIAGNOSIS — R292 Abnormal reflex: Secondary | ICD-10-CM

## 2018-10-28 DIAGNOSIS — G8929 Other chronic pain: Secondary | ICD-10-CM

## 2018-10-28 DIAGNOSIS — G609 Hereditary and idiopathic neuropathy, unspecified: Secondary | ICD-10-CM

## 2018-10-28 NOTE — Patient Instructions (Signed)
1. Your provider has requested that you have labwork completed today. Please go to [facility] ([id number]) on the second floor of this building before leaving the office today. You do not need to check in. If you are not called within 15 minutes please check with the front desk.   2. We have sent a referral to [facility] for your MRI and they will call you directly to schedule your appt. They are located at [place]. If you need to contact them directly please call [phone].  3. Electroencephalogram (EEG) Instructions  To prepare for your EEG: . Wash your hair the night before or the day of the test, but don't use any conditioners, hair creams, sprays or styling gels.  . Avoid anything with caffeine 6 hours before the test. . Take your usual medications unless instructed otherwise. If you're supposed to sleep during your EEG test, your doctor may ask you to sleep less or even avoid sleep entirely the night before your EEG. If you have trouble falling asleep for the test, you might be given a sedative to help you relax.   What to expect You'll feel little or no discomfort during an EEG. The electrodes don't transmit any sensations. They just record your brain waves. If you need to sleep during the EEG, you might be given a sedative beforehand to help you relax. During the test: . A technician measures your head and marks your scalp with a type of pencil, to indicate where to attach the electrodes. Those spots on your scalp may be scrubbed with a gritty cream to improve the quality of the recording.  . A technician attaches flat metal discs (electrodes) to your scalp using a special adhesive. The electrodes are connected with wires to an instrument that amplifies - makes bigger - the brain waves and records them on computer equipment. Some people wear and elastic cap fitted with electrodes, instead of having the adhesive applied to their scalps. Once the electrodes are in  place, an EEG typically takes 30-60 minutes.  . You relax in a comfortable position with your eyes closed during the test. At various times, the technician may ask you to open and close your eyes, perform a few simple calculations, read a paragraph, look at a picture, breathe deeply (hyperventilate) for a few minutes, or look at a flashing light. .  After the test After the test, the technician removes the electrodes or cap. If no sedative was given, you should feel no side effects after the procedure, and you can return to your normal routine.  If you used a sedative, it may take about an hour to partially recover from the medication. You'll need someone to take you home because it can take up to a day for the full effects of the sedative to wear off. Rest and don't drive for the remainder of the day.

## 2018-10-30 LAB — PROTEIN ELECTROPHORESIS, SERUM
Albumin ELP: 3.9 g/dL (ref 3.8–4.8)
Alpha 1: 0.3 g/dL (ref 0.2–0.3)
Alpha 2: 0.8 g/dL (ref 0.5–0.9)
Beta 2: 0.3 g/dL (ref 0.2–0.5)
Beta Globulin: 0.4 g/dL (ref 0.4–0.6)
Gamma Globulin: 0.8 g/dL (ref 0.8–1.7)
Total Protein: 6.6 g/dL (ref 6.1–8.1)

## 2018-10-30 LAB — RPR: RPR Ser Ql: NONREACTIVE

## 2018-10-30 LAB — IMMUNOFIXATION ELECTROPHORESIS
IGM, SERUM: 89 mg/dL (ref 50–300)
IgG (Immunoglobin G), Serum: 728 mg/dL (ref 600–1540)
Immunofix Electr Int: NOT DETECTED
Immunoglobulin A: 234 mg/dL (ref 70–320)

## 2018-10-30 LAB — FOLATE: Folate: 12.6 ng/mL

## 2018-10-30 LAB — VITAMIN B12: Vitamin B-12: 411 pg/mL (ref 200–1100)

## 2018-10-30 LAB — PROTEIN ELECTROPHORESIS,RANDOM URN
Albumin: 46 %
Alpha-1-Globulin, U: 3 %
Alpha-2-Globulin, U: 20 %
Beta Globulin, U: 18 %
Creatinine, Urine: 245 mg/dL (ref 20–275)
Gamma Globulin, U: 14 %
PROTEIN/CREAT RATIO: 94 mg/g{creat} (ref 21–161)
PROTEIN/CREATININE RATIO: 0.094 mg/mg{creat} (ref 0.021–0.16)
Total Protein, Urine: 23 mg/dL (ref 5–24)

## 2018-10-30 LAB — EXTRA URINE SPECIMEN

## 2018-10-30 LAB — IMMUNOFIXATION INTE

## 2018-10-31 ENCOUNTER — Telehealth: Payer: Self-pay | Admitting: Neurology

## 2018-10-31 NOTE — Telephone Encounter (Signed)
Patient made aware of results.  

## 2018-10-31 NOTE — Telephone Encounter (Signed)
-----   Message from [place], DO sent at 10/31/2018  8:06 AM EST ----- I have reviewed all lab results which are normal or stable. Please inform the patient.

## 2018-11-10 ENCOUNTER — Ambulatory Visit
Admission: RE | Admit: 2018-11-10 | Discharge: 2018-11-10 | Disposition: A | Discharge status: Home / Self Care | Payer: Medicare Other | Source: Ambulatory Visit | Attending: Neurology | Admitting: Neurology

## 2018-11-10 DIAGNOSIS — R292 Abnormal reflex: Secondary | ICD-10-CM

## 2018-11-10 DIAGNOSIS — G8929 Other chronic pain: Secondary | ICD-10-CM

## 2018-11-10 DIAGNOSIS — M545 Low back pain, unspecified: Secondary | ICD-10-CM

## 2018-11-10 DIAGNOSIS — G609 Hereditary and idiopathic neuropathy, unspecified: Secondary | ICD-10-CM

## 2018-11-10 MED ORDER — GADOBENATE DIMEGLUMINE 529 MG/ML IV SOLN
18.0000 mL | Freq: Once | INTRAVENOUS | Status: AC | PRN
  Administered 2018-11-10: 18 mL via INTRAVENOUS

## 2018-11-11 ENCOUNTER — Ambulatory Visit: Payer: Medicare Other | Admitting: Neurology

## 2018-11-11 ENCOUNTER — Telehealth: Payer: Self-pay | Admitting: Neurology

## 2018-11-11 DIAGNOSIS — R261 Paralytic gait: Secondary | ICD-10-CM

## 2018-11-11 DIAGNOSIS — R296 Repeated falls: Secondary | ICD-10-CM

## 2018-11-11 DIAGNOSIS — R270 Ataxia, unspecified: Secondary | ICD-10-CM

## 2018-11-11 NOTE — Telephone Encounter (Signed)
Let pt know that MRI lumbar spine results don't show anything to explain gait and think that we need to look at brain and cervical spine (without gad as long as no surgery on c-spine).  Dx:  Ataxia, falls, spastic gait, myelopathy

## 2018-11-11 NOTE — Telephone Encounter (Signed)
Left message on machine for patient to call back.

## 2018-11-12 NOTE — Telephone Encounter (Signed)
Patient called back and was made aware of results. She is okay to proceed with MR brain/C-spine (no previous surgery). Order entered. [facility] will call patient directly to schedule testing.

## 2018-11-12 NOTE — Telephone Encounter (Signed)
Left another message for patient to call me back

## 2018-11-19 ENCOUNTER — Telehealth: Payer: Self-pay | Admitting: Neurology

## 2018-11-19 ENCOUNTER — Ambulatory Visit (INDEPENDENT_AMBULATORY_CARE_PROVIDER_SITE_OTHER): Payer: Medicare Other | Admitting: Neurology

## 2018-11-19 DIAGNOSIS — G609 Hereditary and idiopathic neuropathy, unspecified: Secondary | ICD-10-CM

## 2018-11-19 DIAGNOSIS — G8929 Other chronic pain: Secondary | ICD-10-CM

## 2018-11-19 DIAGNOSIS — M545 Low back pain, unspecified: Secondary | ICD-10-CM

## 2018-11-19 DIAGNOSIS — R292 Abnormal reflex: Secondary | ICD-10-CM

## 2018-11-19 NOTE — Procedures (Signed)
[facility]  [address]  [place], [place] [address] Tel: [phone] Fax:  [phone] Test Date:  11/19/2018  Patient: [name] DOB: 16-Aug-1931 Physician: Narda Amber, DO  Sex: Female Height: 5\' 6"  Ref Phys: Alonza Bogus, DO  ID#: [id number] Temp: 34.0C Technician:    Patient Complaints: This is a 85 year old female with diabetes referred for evaluation of gait instability.  NCV & EMG Findings: Extensive electrodiagnostic testing of the left upper and lower extremity shows:  1. Left median and ulnar sensory responses are within normal limits.   2. Left median and ulnar motor responses are within normal limits. 3. Left sural and superficial peroneal sensory responses are absent. 4. Left peroneal motor responses showed reduced amplitude at the EDB and TA (L1.7, L2.2 mV).  Left tibial motor responses within normal limits.   5. Left tibial H reflex study is absent.   6. In the left upper extremity, there is mild chronic motor axonal loss changes affecting the biceps and deltoid muscles.   7. Needle electrode examination of the left lower extremity was limited due to patient being on anticoagulation therapy.  Of the muscles tested, there is mild chronic motor axonal loss changes affecting the tibialis anterior and medial gastrocnemius muscles.  There is no evidence of accompanied active denervation.    Impression: 1. 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 2. Chronic C5 radiculopathy affecting the left upper extremity, mild in degree electrically.     ___________________________ Narda Amber, DO    Nerve Conduction Studies Anti Sensory Summary Table   Site NR Peak (ms) Norm Peak (ms) P-T Amp (V) Norm P-T Amp  Left Median Anti Sensory (2nd Digit)  34C  Wrist    3.3 <3.8 14.4 >10  Left Sup Peroneal Anti Sensory  (Ant Lat Mall)  34C  12 cm NR  <4.6  >3  Left Sural Anti Sensory (Lat Mall)  34C  Calf NR  <4.6  >3  Left Ulnar Anti Sensory (5th Digit)  34C  Wrist    2.9 <3.2 6.8 >5   Motor Summary Table   Site NR Onset (ms) Norm Onset (ms) O-P Amp (mV) Norm O-P Amp Site1 Site2 Delta-0 (ms) Dist (cm) Vel (m/s) Norm Vel (m/s)  Left Median Motor (Abd Poll Brev)  34C  Wrist    3.9 <4.0 6.4 >5 Elbow Wrist 5.8 30.0 52 >50  Elbow    9.7  5.7         Left Peroneal Motor (Ext Dig Brev)  34C  Ankle    3.7 <6.0 1.7 >2.5 B Fib Ankle 9.1 37.0 41 >40  B Fib    12.8  1.2  Poplt B Fib 1.4 8.0 57 >40  Poplt    14.2  1.2         Left Peroneal TA Motor (Tib Ant)  34C  Fib Head    2.9 <4.5 2.2 >3 Poplit Fib Head 1.1 7.0 64 >40  Poplit    4.0  2.0         Left Tibial Motor (Abd Hall Brev)  34C  Ankle    5.6 <6.0 4.7 >4 Knee Ankle 10.3 41.0 40 >40  Knee    15.9  3.0         Left Ulnar Motor (Abd Dig Minimi)  34C  Wrist    3.1 <3.1 7.1 >7 B Elbow Wrist 4.9 25.0  51 >50  B Elbow    8.0  7.1  A Elbow B Elbow 1.9 10.0 53 >50  A Elbow    9.9  7.0          H Reflex Studies   NR H-Lat (ms) Lat Norm (ms) L-R H-Lat (ms)  Left Tibial (Gastroc)  34C  NR  <35    EMG   Side Muscle Ins Act Fibs Psw Fasc Number Recrt Dur Dur. Amp Amp. Poly Poly. Comment  Left AntTibialis Nml Nml Nml Nml 1- Rapid Some 1+ Some 1+ Nml Nml N/A  Left Gastroc Nml Nml Nml Nml 1- Rapid Some 1+ Some 1+ Nml Nml N/A  Left RectFemoris Nml Nml Nml Nml Nml Nml Nml Nml Nml Nml Nml Nml N/A  Left BicepsFemS Nml Nml Nml Nml Nml Nml Nml Nml Nml Nml Nml Nml N/A  Left 1stDorInt Nml Nml Nml Nml Nml Nml Nml Nml Nml Nml Nml Nml N/A  Left Ext Indicis Nml Nml Nml Nml Nml Nml Nml Nml Nml Nml Nml Nml N/A  Left PronatorTeres Nml Nml Nml Nml Nml Nml Nml Nml Nml Nml Nml Nml N/A  Left Biceps Nml Nml Nml Nml 1- Rapid Few 1+ Few 1+ Nml Nml N/A  Left Deltoid Nml Nml Nml Nml 1- Rapid Some 1+ Some 1+ Nml Nml N/A  Left Triceps Nml Nml Nml Nml Nml Nml Nml Nml Nml Nml  Nml Nml N/A      Waveforms:

## 2018-11-19 NOTE — Telephone Encounter (Signed)
Left message on machine for patient to call back.

## 2018-11-19 NOTE — Telephone Encounter (Signed)
-----   Message from [place], DO sent at 11/19/2018  1:10 PM EST ----- Let pt know that she does have mod to severe PN.  This certainly can affect gait and balance but lets await MRI brain/c-spine scheduled next week to see if anything else going on.  PT could help some if PN is only thing going on

## 2018-11-20 NOTE — Telephone Encounter (Signed)
Patient made aware of results.  

## 2018-11-26 ENCOUNTER — Ambulatory Visit
Admission: RE | Admit: 2018-11-26 | Discharge: 2018-11-26 | Disposition: A | Discharge status: Home / Self Care | Payer: Medicare Other | Source: Ambulatory Visit | Attending: Neurology | Admitting: Neurology

## 2018-11-26 DIAGNOSIS — R261 Paralytic gait: Secondary | ICD-10-CM

## 2018-11-26 DIAGNOSIS — R270 Ataxia, unspecified: Secondary | ICD-10-CM

## 2018-11-26 DIAGNOSIS — R296 Repeated falls: Secondary | ICD-10-CM

## 2018-11-27 ENCOUNTER — Encounter: Payer: Self-pay | Admitting: Neurology

## 2018-11-27 ENCOUNTER — Telehealth: Payer: Self-pay | Admitting: Neurology

## 2018-11-27 NOTE — Telephone Encounter (Signed)
Spoke with patient and advised on testing. She is not interested in physical therapy at this time (has had it in the past) but would like to consult with Dr. Posey Pronto.  She did want me to ask if a medication like Lyrica could be given to help. Please advise.

## 2018-11-27 NOTE — Telephone Encounter (Signed)
Patient called for test results. Thanks

## 2018-11-27 NOTE — Telephone Encounter (Signed)
Patient is calling in again about results. Thanks!

## 2018-11-27 NOTE — Telephone Encounter (Signed)
Unfortunately, lyrica doesn't help the balance aspect of neuropathy at all.  It only helps the burning paresthesias of neuropathy

## 2018-11-27 NOTE — Telephone Encounter (Signed)
Patient made aware and she expressed understanding.   [name] - can you schedule patient for next available New patient appt with Dr. Posey Pronto (she approved) for evaluation PN per Dr. Carles Collet? Thanks so much.

## 2018-11-27 NOTE — Telephone Encounter (Signed)
I got her sch to see patel 02-05-19

## 2018-11-27 NOTE — Telephone Encounter (Signed)
Ludwig Clarks, DO sent to Annamaria Helling, [place]        Reviewed. Atrophy and mild small vessel disease. Luvenia Starch, you can give patient results. Overall brain looks really good

## 2018-11-27 NOTE — Telephone Encounter (Signed)
-----   Message from [place], DO sent at 11/26/2018  6:00 PM EST ----- Some spinal stenosis at c5-6 but nothing to explain walking.  [name], let pt know that while she has degenerative changes on MRI cervical spine, it doesn't explain walking.  She does have PN but not sure if that is only cause.

## 2018-11-27 NOTE — Telephone Encounter (Signed)
Tried to call patient with no answer and no voicemail. Will await call back.  Also, per Dr. Carles Collet I can offer patient appt with Dr. Posey Pronto for next New patient evaluation.

## 2018-12-05 ENCOUNTER — Ambulatory Visit (HOSPITAL_COMMUNITY)
Admission: RE | Admit: 2018-12-05 | Discharge: 2018-12-05 | Disposition: A | Discharge status: Home / Self Care | Payer: Medicare Other | Source: Ambulatory Visit | Attending: Nurse Practitioner | Admitting: Nurse Practitioner

## 2018-12-05 ENCOUNTER — Telehealth: Payer: Self-pay | Admitting: Cardiovascular Disease

## 2018-12-05 VITALS — BP 110/74 | HR 82 | Ht 66.0 in | Wt 196.0 lb

## 2018-12-05 DIAGNOSIS — I4819 Other persistent atrial fibrillation: Secondary | ICD-10-CM

## 2018-12-05 DIAGNOSIS — Z833 Family history of diabetes mellitus: Secondary | ICD-10-CM | POA: Diagnosis not present

## 2018-12-05 DIAGNOSIS — Z7901 Long term (current) use of anticoagulants: Secondary | ICD-10-CM | POA: Insufficient documentation

## 2018-12-05 DIAGNOSIS — D509 Iron deficiency anemia, unspecified: Secondary | ICD-10-CM | POA: Diagnosis not present

## 2018-12-05 DIAGNOSIS — K219 Gastro-esophageal reflux disease without esophagitis: Secondary | ICD-10-CM | POA: Insufficient documentation

## 2018-12-05 DIAGNOSIS — I10 Essential (primary) hypertension: Secondary | ICD-10-CM | POA: Diagnosis not present

## 2018-12-05 DIAGNOSIS — E119 Type 2 diabetes mellitus without complications: Secondary | ICD-10-CM | POA: Diagnosis not present

## 2018-12-05 DIAGNOSIS — E7800 Pure hypercholesterolemia, unspecified: Secondary | ICD-10-CM | POA: Diagnosis not present

## 2018-12-05 DIAGNOSIS — Z85850 Personal history of malignant neoplasm of thyroid: Secondary | ICD-10-CM | POA: Diagnosis not present

## 2018-12-05 DIAGNOSIS — R9431 Abnormal electrocardiogram [ECG] [EKG]: Secondary | ICD-10-CM | POA: Diagnosis not present

## 2018-12-05 DIAGNOSIS — Z7984 Long term (current) use of oral hypoglycemic drugs: Secondary | ICD-10-CM | POA: Insufficient documentation

## 2018-12-05 DIAGNOSIS — F411 Generalized anxiety disorder: Secondary | ICD-10-CM | POA: Diagnosis not present

## 2018-12-05 DIAGNOSIS — Z853 Personal history of malignant neoplasm of breast: Secondary | ICD-10-CM | POA: Diagnosis not present

## 2018-12-05 DIAGNOSIS — Z880 Allergy status to penicillin: Secondary | ICD-10-CM | POA: Diagnosis not present

## 2018-12-05 DIAGNOSIS — Z8541 Personal history of malignant neoplasm of cervix uteri: Secondary | ICD-10-CM | POA: Insufficient documentation

## 2018-12-05 DIAGNOSIS — Z8042 Family history of malignant neoplasm of prostate: Secondary | ICD-10-CM | POA: Diagnosis not present

## 2018-12-05 DIAGNOSIS — E890 Postprocedural hypothyroidism: Secondary | ICD-10-CM | POA: Insufficient documentation

## 2018-12-05 DIAGNOSIS — Z79899 Other long term (current) drug therapy: Secondary | ICD-10-CM | POA: Diagnosis not present

## 2018-12-05 DIAGNOSIS — Z8249 Family history of ischemic heart disease and other diseases of the circulatory system: Secondary | ICD-10-CM | POA: Diagnosis not present

## 2018-12-05 DIAGNOSIS — H409 Unspecified glaucoma: Secondary | ICD-10-CM | POA: Diagnosis not present

## 2018-12-05 DIAGNOSIS — G4733 Obstructive sleep apnea (adult) (pediatric): Secondary | ICD-10-CM | POA: Insufficient documentation

## 2018-12-05 DIAGNOSIS — I420 Dilated cardiomyopathy: Secondary | ICD-10-CM | POA: Diagnosis not present

## 2018-12-05 DIAGNOSIS — Z79890 Hormone replacement therapy (postmenopausal): Secondary | ICD-10-CM | POA: Insufficient documentation

## 2018-12-05 MED ORDER — METOPROLOL TARTRATE 25 MG PO TABS: 25.0000 mg | ORAL_TABLET | Freq: Two times a day (BID) | ORAL | 11 refills | Status: DC

## 2018-12-05 NOTE — Patient Instructions (Signed)
Your physician has recommended you make the following change in your medication:  INCREASE the Metoprolol to 25 mg taking a whole tablet twice a day  Your physician recommends that you schedule a follow-up appointment in: 12/12/2018 11:00 IN THE AFIB CLINIC

## 2018-12-05 NOTE — Telephone Encounter (Signed)
New Message   STAT if HR is under 50 or over 120 (normal HR is 60-100 beats per minute)  1) What is your heart rate? 103  2) Do you have a log of your heart rate readings (document readings)? 103, 90"s  3) Do you have any other symptoms? Shortness of breath  Patient believes she is in and out of afib.   Pt c/o Shortness Of Breath: STAT if SOB developed within the last 24 hours or pt is noticeably SOB on the phone  1. Are you currently SOB (can you hear that pt is SOB on the phone)? Yes, can hear it on the phone   2. How long have you been experiencing SOB? About a month  3. Are you SOB when sitting or when up moving around? both  4. Are you currently experiencing any other symptoms? Increased HR. One moment is really high and then it will go down the right back. Patient believes she may be in and out of afib

## 2018-12-05 NOTE — Telephone Encounter (Signed)
Pt audibly SOB on the phone. States her Diltiazem was decreased in October b/c HRs were dropping low.  States she feels that now her HR is going to high on the lower dose. Reports HRs "jumping around", feels  "fluttering". She has been experiencing SOB for "about 6 weeks". She may have been out of rhythm "for a month, maybe". Denies CP, dizziness/syncope. States that BPs are normal but she hasn't been writing them down. Pt scheduled to see AFib clinic this morning for EKG and evaluation. Pt very appreciative and agreeable to plan.

## 2018-12-05 NOTE — Progress Notes (Signed)
Primary Care Physician: Lavone Orn, MD Cardiologist: [name] Referral provider: Truitt Merle, NP Pulmonologist: Dr. Elsworth Soho GI: Dr. Dolphus Jenny [name] is a 85 y.o. female with a h/o afib that has been on amiodarone since June of 2015. She had afib 12/2016, in the setting of persistent cough x 6-8 weeks  that had not responded to two rounds of prednisone and antibiotics. She had also been using cough syrup with decongestants. Her Cardizem was increased and  BB was added with good slowing of v rate and with slower heart rate, she does feel better but persists in afib. She is not really wanting a cardioversion. Her biggest concern was  cough. She did have a CXR and other than a large hiatal hernia, there were no other abnormalities noted. TSH had also been mildly elevated but PCP aware and was monitoring. .Continues on eliquis. She did have repair of Hiatal hernia 05/25/17.  She is in the  afib clinic for return to afib 7-10 days ago. She had been trying to wean off amiodarone with concerns re her lungs and her eye doctor could see evidence of amio use, but she states that her vision had not changed. CT chest in March did not show any evidence of lung toxicity for amiodarone. She is pending another eval with lung doctor 11/13.  She increased her amio back to 200 mg a day about 10 days ago.  We discussed options to amiodarone, with either Tikosyn or sotalol and would have to let amio wash out and may take 2-3 months before eligible for the other drugs. She does not think she can tolerate afib for that period of time. We also discussed possible av nodal ablation and PPM but she does not like this option as well. For now she is happy to use the amiodarone. No missed doses of eliquis.She is tolerating the afib.  F/u 12/05/2018. Natasha have not seen pt in over one year and in that period of time she has been maintaining  SR.  Unfortunately, she has not felt well lately, asked to be seen and in rate  controlled afib. She states no recent illness but has been aggravated by her phone carrier and her daughter lately.No missed doses of eliquis.   Today, she denies symptoms of chest pain, shortness of breath, orthopnea, PND, lower extremity edema, dizziness, presyncope, syncope, or neurologic sequela.Perisistent cough. The patient is tolerating medications without difficulties and is otherwise without complaint today.   Past Medical History:  Diagnosis Date  . Acute bronchitis 11/15/2017  . ADENOCARCINOMA, BREAST 10/13/2010   Qualifier: Diagnosis of  By: Nils Pyle CMA (AAMA), Mearl Latin    . 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 Allergy    SEASONAL  . Anemia    years ago  . ANEMIA, IRON DEFICIENCY 10/14/2010   Qualifier: Diagnosis of  By: Sharlett Iles MD Byrd Hesselbach   . Anemia, unspecified 10/13/2010   Qualifier: Diagnosis of  By: Nils Pyle CMA ([place]), Mearl Latin    . Anticoagulated by anticoagulation treatment - Eliquis 03/30/2014   Started May 2015   . Anxiety state 10/13/2010   Qualifier: Diagnosis of  By: Nils Pyle CMA ([place]), Mearl Latin    . Arthritis    "all my joints" (03/31/2014)  . Atrial fibrillation ([place])   . Atrial fibrillation with RVR ([place]) 03/31/2015   DLCO dropped from 100% in 2015-50% in 01/2017  . Barrett's esophagus   . Breast cancer [facility])    s/p right breast lumpectomy  .  CARDIAC MURMUR 10/13/2010   Qualifier: Diagnosis of  By: Nils Pyle CMA ([place]), Mearl Latin    . 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 Cervical cancer ([place])   . Chronic cough 01/02/2017   GERD, hiatal hernia Doubt amio  . COLONIC POLYPS, ADENOMATOUS, HX OF 10/13/2010   Qualifier: Diagnosis of  By: Nils Pyle CMA (AAMA), Mearl Latin    . Degenerative disorder of eye 07/25/2012  . Diabetes mellitus without complication ([place])   . Dilated cardiomyopathy ([place]) 08/27/2018  . DIVERTICULOSIS, COLON 10/13/2010   Qualifier: Diagnosis of  By: Nils Pyle CMA (AAMA), Mearl Latin    . DJD (degenerative joint disease) of knee   . Dysrhythmia     Afib  . EROSIVE ESOPHAGITIS 10/13/2010   Qualifier: Diagnosis of  By: Nils Pyle CMA ([place]), Mearl Latin    . Error, refractive, myopia 07/09/2012  . Essential hypertension 10/13/2010   Qualifier: Diagnosis of  By: Nils Pyle CMA ([place]), Mearl Latin    . Exotropia, intermittent 07/09/2012  . GERD 10/14/2010   Qualifier: Diagnosis of  By: Sharlett Iles MD Byrd Hesselbach GERD (gastroesophageal reflux disease)   . Glaucoma   . H/O hiatal hernia   . Heart murmur   . Hiatal hernia s/p robotic repair & fundoplication 9/47/0962 83/04/6293   Qualifier: Diagnosis of  By: Nils Pyle CMA (AAMA), Mearl Latin    . History of laser assisted in situ keratomileusis 07/25/2012  . Hx of transesophageal echocardiography (TEE) for monitoring    TEE (03/2014): No LAA clot, moderate LAE, core triatriatum type structure in RA with no stenosis, normal EF 55-60%, mild MR  . HYPERCHOLESTEROLEMIA 10/13/2010   Qualifier: Diagnosis of  By: Nils Pyle CMA ([place]), Mearl Latin    . Hypertension   . Hypothyroidism, postsurgical   . Irritable bowel syndrome 10/13/2010   Qualifier: Diagnosis of  By: Nils Pyle CMA (AAMA), Mearl Latin    . Lumbar stenosis with neurogenic claudication 04/20/2016  . Malignant neoplasm of breast ([place]) 02/25/2016  . Migraine    "last one was years ago" (03/31/2014)  . MIGRAINE HEADACHE 10/13/2010   Qualifier: Diagnosis of  By: Nils Pyle CMA ([place]), Mearl Latin    . 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 OSA on CPAP    settings at 10-11   . OSTEOARTHRITIS 10/13/2010   Qualifier: Diagnosis of  By: Nils Pyle CMA ([place]), Mearl Latin    . Post-surgical hypothyroidism 04/09/2014  . RECTAL FISSURE 10/13/2010   Qualifier: Diagnosis of  By: Nils Pyle CMA ([place]), Mearl Latin    . S/P Nissen fundoplication (without gastrostomy tube) procedure 05/25/2017  . Sleep apnea   . THYROID CANCER 10/13/2010   Qualifier: Diagnosis of  By: Nils Pyle CMA ([place]), Mearl Latin    . Thyroid cancer [facility])    s/p thyroidectomy  . Type II diabetes mellitus ([place])    type II    Past Surgical  History:  Procedure Laterality Date  . ABDOMINAL HYSTERECTOMY     "partial"  . BREAST BIOPSY Right   . BREAST LUMPECTOMY Right   . CARDIOVERSION N/A 03/30/2014   Procedure: CARDIOVERSION - BEDSIDE;  Surgeon: [name], MD;  Location: [facility];  Service: Cardiovascular;  Laterality: N/A;  . CARDIOVERSION N/A 03/31/2014   Procedure: CARDIOVERSION  (BEDSIDE) ;  Surgeon: Lelon Perla, MD;  Location: [facility];  Service: Cardiovascular;  Laterality: N/A;  . CARDIOVERSION N/A 04/27/2014   Procedure: CARDIOVERSION;  Surgeon: Darlin Coco, MD;  Location: [facility] ENDOSCOPY;  Service: Cardiovascular;  Laterality: N/A;  . CARDIOVERSION N/A 04/13/2015   Procedure: CARDIOVERSION;  Surgeon:  Thayer Headings, MD;  Location: [facility];  Service: Cardiovascular;  Laterality: N/A;  . CARDIOVERSION N/A 01/08/2017   Procedure: CARDIOVERSION;  Surgeon: Sanda Klein, MD;  Location: [facility] ENDOSCOPY;  Service: Cardiovascular;  Laterality: N/A;  . CARDIOVERSION N/A 09/19/2017   Procedure: CARDIOVERSION;  Surgeon: [name], MD;  Location[facility] ENDOSCOPY;  Service: Cardiovascular;  Laterality: N/A;  . CARDIOVERSION N/A 10/25/2017   Procedure: CARDIOVERSION;  Surgeon: Acie Fredrickson Wonda Cheng, MD;  Location: [facility];  Service: Cardiovascular;  Laterality: N/A;  . CARPAL TUNNEL RELEASE Right   . CATARACT EXTRACTION W/ INTRAOCULAR LENS  IMPLANT, BILATERAL Bilateral   . COLONOSCOPY    . EXCISIONAL HEMORRHOIDECTOMY    . INSERTION OF MESH N/A 05/25/2017   Procedure: INSERTION OF MESH;  Surgeon: Ralene Ok, MD;  Location: [facility];  Service: General;  Laterality: N/A;  . TEE WITHOUT CARDIOVERSION N/A 03/30/2014   Procedure: TRANSESOPHAGEAL ECHOCARDIOGRAM (TEE);  Surgeon: [name], MD;  Location: [facility] ENDOSCOPY;  Service: Cardiovascular;  Laterality: N/A;  . THYROIDECTOMY    . TONSILLECTOMY      Current Outpatient Medications  Medication Sig Dispense Refill  . acetaminophen (TYLENOL) 650 MG CR tablet Take 650 mg  by mouth every 8 (eight) hours as needed for pain.     Marland Kitchen amiodarone (PACERONE) 200 MG tablet TAKE 1 TABLET BY MOUTH  DAILY 90 tablet 3  . diltiazem (CARDIZEM CD) 300 MG 24 hr capsule Take 1 capsule (300 mg total) by mouth daily. 90 capsule 3  . ELIQUIS 5 MG TABS tablet TAKE 1 TABLET BY MOUTH TWO  TIMES DAILY 180 tablet 3  . ferrous sulfate 325 (65 FE) MG tablet Take 325 mg by mouth daily with breakfast.    . fluticasone (FLONASE) 50 MCG/ACT nasal spray Place 1 spray into both nostrils at bedtime.     . hydrochlorothiazide (HYDRODIURIL) 25 MG tablet Take 1 tablet (25 mg total) by mouth daily. (Patient taking differently: Take 25 mg by mouth as needed. ) 90 tablet 3  . lansoprazole (PREVACID) 30 MG capsule Take 30 mg by mouth as directed.    Marland Kitchen levothyroxine (SYNTHROID, LEVOTHROID) 175 MCG tablet Take 175 mcg by mouth daily before breakfast.    . LORazepam (ATIVAN) 1 MG tablet Take 1.5 mg by mouth at bedtime.    Marland Kitchen losartan (COZAAR) 50 MG tablet Take 1 tablet (50 mg total) by mouth daily. 90 tablet 3  . Lutein 6 MG CAPS Take 6 mg by mouth daily.     . metFORMIN (GLUMETZA) 500 MG (MOD) 24 hr tablet Take 250 mg by mouth 2 (two) times daily with a meal.     . Multiple Vitamins-Minerals (ICAPS AREDS 2) CAPS Take 1 capsule by mouth 2 (two) times daily.     . Polyvinyl Alcohol-Povidone PF (REFRESH) 1.4-0.6 % SOLN Place 1 drop into both eyes at bedtime.    . potassium chloride (KLOR-CON) 20 MEQ packet Take 20 mEq by mouth daily.     . rosuvastatin (CRESTOR) 10 MG tablet Take 10 mg by mouth at bedtime.    . sertraline (ZOLOFT) 50 MG tablet Take 50 mg by mouth daily.    . vitamin B-12 (CYANOCOBALAMIN) 500 MCG tablet Take 500 mcg by mouth daily as needed (energy).    . meclizine (ANTIVERT) 12.5 MG tablet 1 tablet daily.    . metoprolol tartrate (LOPRESSOR) 25 MG tablet Take 1 tablet (25 mg total) by mouth 2 (two) times daily. 60 tablet 11   No  current facility-administered medications for this encounter.      Allergies  Allergen Reactions  . Penicillins Itching, Rash and Other (See Comments)    Has patient had a PCN reaction causing immediate rash, facial/tongue/throat swelling, SOB or lightheadedness with hypotension: Unknown, childhood reaction Has patient had a PCN reaction causing severe rash involving mucus membranes or skin necrosis: No Has patient had a PCN reaction that required hospitalization No Has patient had a PCN reaction occurring within the last 10 years: No If all of the above answers are "NO", then may proceed with Cephalosporin use.     Social History   Socioeconomic History  . Marital status: Divorced    Spouse name: Not on file  . Number of children: 2  . Years of education: Not on file  . Highest education level: Not on file  Occupational History  . Occupation: Retired    Comment: Education officer, museum  Social Needs  . Financial resource strain: Not on file  . Food insecurity:    Worry: Not on file    Inability: Not on file  . Transportation needs:    Medical: Not on file    Non-medical: Not on file  Tobacco Use  . Smoking status: Never Smoker  . Smokeless tobacco: Never Used  Substance and Sexual Activity  . Alcohol use: No    Alcohol/week: 0.0 standard drinks  . Drug use: No  . Sexual activity: Never  Lifestyle  . Physical activity:    Days per week: Not on file    Minutes per session: Not on file  . Stress: Not on file  Relationships  . Social connections:    Talks on phone: Not on file    Gets together: Not on file    Attends religious service: Not on file    Active member of club or organization: Not on file    Attends meetings of clubs or organizations: Not on file    Relationship status: Not on file  . Intimate partner violence:    Fear of current or ex partner: Not on file    Emotionally abused: Not on file    Physically abused: Not on file    Forced sexual activity: Not on file  Other Topics Concern  . Not on file  Social History  Narrative  . Not on file    Family History  Problem Relation Age of Onset  . Prostate cancer Father   . Diabetes Sister   . Heart disease Sister   . Heart attack Brother   . Heart disease Brother   . Diabetes Brother   . Diabetes Sister   . Diabetes Sister   . Pancreatic cancer Sister   . Diabetes Brother   . Heart disease Brother   . Diabetes Brother   . Heart disease Brother   . Diabetes Brother   . Heart disease Brother   . Diabetes Brother   . Heart disease Brother   . Diabetes Brother   . Heart disease Brother   . Diabetes Brother   . Diabetes Child     ROS- All systems are reviewed and negative except as per the HPI above  Physical Exam: Vitals:   12/05/18 1103  BP: 110/74  Pulse: 82  SpO2: 96%  Weight: 88.9 kg  Height: 5\' 6"  (1.676 m)   Wt Readings from Last 3 Encounters:  12/05/18 88.9 kg  10/28/18 87.1 kg  10/01/18 87 kg    Labs: Lab Results  Component Value Date  NA 140 10/15/2018   K 4.3 10/15/2018   CL 104 10/15/2018   CO2 20 10/15/2018   GLUCOSE 91 10/15/2018   BUN 19 10/15/2018   CREATININE 1.00 10/15/2018   CALCIUM 9.3 10/15/2018   MG 2.2 06/07/2017   Lab Results  Component Value Date   INR 1.12 04/20/2016   No results found for: CHOL, HDL, LDLCALC, TRIG   GEN- The patient is well appearing, alert and oriented x 3 today.   Head- normocephalic, atraumatic Eyes-  Sclera clear, conjunctiva pink Ears- hearing intact Oropharynx- clear Neck- supple, no JVP Lymph- no cervical lymphadenopathy Lungs- Clear to ausculation bilaterally, normal work of breathing Heart- irregular rate and rhythm, no murmurs, rubs or gallops, PMI not laterally displaced GI- soft, NT, ND, + BS Extremities- no clubbing, cyanosis, or edema MS- no significant deformity or atrophy Skin- no rash or lesion Psych- euthymic mood, full affect Neuro- strength and sensation are intact  EKG- afib at 82 bpm, qrs int 100 ms, qtc 453 ms Epic records  reviewed Cxr-IMPRESSION: There is a large hiatal hernia with worsening gaseous distention. The hernia measures at least 9.8 x 9.4 cm. Moderate gas noted within esophagus. No infiltrate or pulmonary edema. Surgical clips are noted in right axilla.   Assessment and Plan: 1. Persistent afib Had been in regular rhythm x one year but with recent breakthrough afib, rate controlled Continue amiodarone 200 mg a day Pt would like to increase metoprolol to 25 mg bid before she considers cardioversion  She will have to reduce back to 12.5 mg bid if she is cardioverted as she is very slow in SR Continue cardizem 300 mg qd Continue eliquis 5 mg bid for chadsvasc score of at least 5, states no missed doses  F/u in one week     [name], [facility] [address] [place], [place] [id number] [phone]

## 2018-12-12 ENCOUNTER — Ambulatory Visit (HOSPITAL_COMMUNITY)
Admission: RE | Admit: 2018-12-12 | Discharge: 2018-12-12 | Disposition: A | Discharge status: Home / Self Care | Payer: Medicare Other | Source: Ambulatory Visit | Attending: Nurse Practitioner | Admitting: Nurse Practitioner

## 2018-12-12 ENCOUNTER — Encounter (HOSPITAL_COMMUNITY): Payer: Self-pay | Admitting: Nurse Practitioner

## 2018-12-12 VITALS — BP 112/68 | HR 77 | Ht 66.0 in | Wt 194.0 lb

## 2018-12-12 DIAGNOSIS — G4730 Sleep apnea, unspecified: Secondary | ICD-10-CM | POA: Diagnosis not present

## 2018-12-12 DIAGNOSIS — K219 Gastro-esophageal reflux disease without esophagitis: Secondary | ICD-10-CM | POA: Diagnosis not present

## 2018-12-12 DIAGNOSIS — R05 Cough: Secondary | ICD-10-CM | POA: Diagnosis not present

## 2018-12-12 DIAGNOSIS — Z79899 Other long term (current) drug therapy: Secondary | ICD-10-CM | POA: Insufficient documentation

## 2018-12-12 DIAGNOSIS — M1990 Unspecified osteoarthritis, unspecified site: Secondary | ICD-10-CM | POA: Insufficient documentation

## 2018-12-12 DIAGNOSIS — E7800 Pure hypercholesterolemia, unspecified: Secondary | ICD-10-CM | POA: Insufficient documentation

## 2018-12-12 DIAGNOSIS — I10 Essential (primary) hypertension: Secondary | ICD-10-CM | POA: Insufficient documentation

## 2018-12-12 DIAGNOSIS — R001 Bradycardia, unspecified: Secondary | ICD-10-CM | POA: Insufficient documentation

## 2018-12-12 DIAGNOSIS — G4733 Obstructive sleep apnea (adult) (pediatric): Secondary | ICD-10-CM | POA: Insufficient documentation

## 2018-12-12 DIAGNOSIS — I4819 Other persistent atrial fibrillation: Secondary | ICD-10-CM | POA: Insufficient documentation

## 2018-12-12 DIAGNOSIS — R011 Cardiac murmur, unspecified: Secondary | ICD-10-CM | POA: Insufficient documentation

## 2018-12-12 DIAGNOSIS — M1710 Unilateral primary osteoarthritis, unspecified knee: Secondary | ICD-10-CM | POA: Insufficient documentation

## 2018-12-12 DIAGNOSIS — H409 Unspecified glaucoma: Secondary | ICD-10-CM | POA: Insufficient documentation

## 2018-12-12 DIAGNOSIS — Z7901 Long term (current) use of anticoagulants: Secondary | ICD-10-CM | POA: Insufficient documentation

## 2018-12-12 DIAGNOSIS — K2270 Barrett's esophagus without dysplasia: Secondary | ICD-10-CM | POA: Diagnosis not present

## 2018-12-12 LAB — BASIC METABOLIC PANEL
Anion gap: 7 (ref 5–15)
BUN: 18 mg/dL (ref 8–23)
CO2: 25 mmol/L (ref 22–32)
Calcium: 9.1 mg/dL (ref 8.9–10.3)
Chloride: 107 mmol/L (ref 98–111)
Creatinine, Ser: 0.97 mg/dL (ref 0.44–1.00)
GFR calc Af Amer: >60 mL/min (ref >60)
GFR calc non Af Amer: 53 mL/min — ABNORMAL LOW (ref >60)
Glucose, Bld: 131 mg/dL — ABNORMAL HIGH (ref 70–99)
Potassium: 4.6 mmol/L (ref 3.5–5.1)
Sodium: 139 mmol/L (ref 135–145)

## 2018-12-12 LAB — TSH: TSH: 1.045 u[IU]/mL (ref 0.350–4.500)

## 2018-12-12 LAB — CBC
HCT: 40.2 % (ref 36.0–46.0)
Hemoglobin: 13 g/dL (ref 12.0–15.0)
MCH: 31.6 pg (ref 26.0–34.0)
MCHC: 32.3 g/dL (ref 30.0–36.0)
MCV: 97.8 fL (ref 80.0–100.0)
Platelets: 170 10*3/uL (ref 150–400)
RBC: 4.11 MIL/uL (ref 3.87–5.11)
RDW: 12.6 % (ref 11.5–15.5)
WBC: 5.1 10*3/uL (ref 4.0–10.5)
nRBC: 0 % (ref 0.0–0.2)

## 2018-12-12 NOTE — Progress Notes (Signed)
Primary Care Physician: Lavone Orn, MD Cardiologist: [name] Referral provider: Truitt Merle, NP Pulmonologist: Dr. Elsworth Soho GI: Dr. Dolphus Jenny [name] is a 85 y.o. female with a h/o afib that has been on amiodarone since June of 2015. She had afib 12/2016, in the setting of persistent cough x 6-8 weeks  that had not responded to two rounds of prednisone and antibiotics. She had also been using cough syrup with decongestants. Her Cardizem was increased and  BB was added with good slowing of v rate and with slower heart rate, she does feel better but persists in afib. She is not really wanting a cardioversion. Her biggest concern was  cough. She did have a CXR and other than a large hiatal hernia, there were no other abnormalities noted. TSH had also been mildly elevated but PCP aware and was monitoring. .Continues on eliquis. She did have repair of Hiatal hernia 05/25/17.  She is in the  afib clinic for return to afib 7-10 days ago. She had been trying to wean off amiodarone with concerns re her lungs and her eye doctor could see evidence of amio use, but she states that her vision had not changed. CT chest in March did not show any evidence of lung toxicity for amiodarone. She is pending another eval with lung doctor 11/13.  She increased her amio back to 200 mg a day about 10 days ago.  We discussed options to amiodarone, with either Tikosyn or sotalol and would have to let amio wash out and may take 2-3 months before eligible for the other drugs. She does not think she can tolerate afib for that period of time. We also discussed possible av nodal ablation and PPM but she does not like this option as well. For now she is happy to use the amiodarone. No missed doses of eliquis.She is tolerating the afib.  F/u 12/05/2018. Natasha have not seen pt in over one year and in that period of time she has been maintaining  SR.  Unfortunately, she has not felt well lately, asked to be seen and in rate  controlled afib. 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 No missed doses of eliquis.   F/u afib clinic 12/12/18. Patient is in rate controlled afib today. She admits that she has continued to have palpitations and feel weak since her last visit.   Today, she denies symptoms of chest pain, shortness of breath, orthopnea, PND, lower extremity edema, dizziness, presyncope, syncope, or neurologic sequela. The patient is tolerating medications without difficulties and is otherwise without complaint today.   Past Medical History:  Diagnosis Date  . Acute bronchitis 11/15/2017  . ADENOCARCINOMA, BREAST 10/13/2010   Qualifier: Diagnosis of  By: Nils Pyle CMA (AAMA), Mearl Latin    . 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 Allergy    SEASONAL  . Anemia    years ago  . ANEMIA, IRON DEFICIENCY 10/14/2010   Qualifier: Diagnosis of  By: Sharlett Iles MD Byrd Hesselbach   . Anemia, unspecified 10/13/2010   Qualifier: Diagnosis of  By: Nils Pyle CMA ([place]), Mearl Latin    . Anticoagulated by anticoagulation treatment - Eliquis 03/30/2014   Started May 2015   . Anxiety state 10/13/2010   Qualifier: Diagnosis of  By: Nils Pyle CMA ([place]), Mearl Latin    . Arthritis    "all my joints" (03/31/2014)  . Atrial fibrillation ([place])   . Atrial fibrillation with RVR ([place])  03/31/2015   DLCO dropped from 100% in 2015-50% in 01/2017  . Barrett's esophagus   . Breast cancer [facility])    s/p right breast lumpectomy  . CARDIAC MURMUR 10/13/2010   Qualifier: Diagnosis of  By: Nils Pyle CMA ([place]), Mearl Latin    . 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 Cervical cancer ([place])   . Chronic cough 01/02/2017   GERD, hiatal hernia Doubt amio  . COLONIC POLYPS, ADENOMATOUS, HX OF 10/13/2010   Qualifier: Diagnosis of  By: Nils Pyle CMA (AAMA), Mearl Latin    . Degenerative disorder of eye 07/25/2012  . Diabetes mellitus without complication ([place])   . Dilated cardiomyopathy ([place]) 08/27/2018  . DIVERTICULOSIS,  COLON 10/13/2010   Qualifier: Diagnosis of  By: Nils Pyle CMA (AAMA), Mearl Latin    . DJD (degenerative joint disease) of knee   . Dysrhythmia    Afib  . EROSIVE ESOPHAGITIS 10/13/2010   Qualifier: Diagnosis of  By: Nils Pyle CMA ([place]), Mearl Latin    . Error, refractive, myopia 07/09/2012  . Essential hypertension 10/13/2010   Qualifier: Diagnosis of  By: Nils Pyle CMA ([place]), Mearl Latin    . Exotropia, intermittent 07/09/2012  . GERD 10/14/2010   Qualifier: Diagnosis of  By: Sharlett Iles MD Byrd Hesselbach GERD (gastroesophageal reflux disease)   . Glaucoma   . H/O hiatal hernia   . Heart murmur   . Hiatal hernia s/p robotic repair & fundoplication 2/53/6644 01/15/7424   Qualifier: Diagnosis of  By: Nils Pyle CMA (AAMA), Mearl Latin    . History of laser assisted in situ keratomileusis 07/25/2012  . Hx of transesophageal echocardiography (TEE) for monitoring    TEE (03/2014): No LAA clot, moderate LAE, core triatriatum type structure in RA with no stenosis, normal EF 55-60%, mild MR  . HYPERCHOLESTEROLEMIA 10/13/2010   Qualifier: Diagnosis of  By: Nils Pyle CMA ([place]), Mearl Latin    . Hypertension   . Hypothyroidism, postsurgical   . Irritable bowel syndrome 10/13/2010   Qualifier: Diagnosis of  By: Nils Pyle CMA (AAMA), Mearl Latin    . Lumbar stenosis with neurogenic claudication 04/20/2016  . Malignant neoplasm of breast ([place]) 02/25/2016  . Migraine    "last one was years ago" (03/31/2014)  . MIGRAINE HEADACHE 10/13/2010   Qualifier: Diagnosis of  By: Nils Pyle CMA ([place]), Mearl Latin    . 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 OSA on CPAP    settings at 10-11   . OSTEOARTHRITIS 10/13/2010   Qualifier: Diagnosis of  By: Nils Pyle CMA ([place]), Mearl Latin    . Post-surgical hypothyroidism 04/09/2014  . RECTAL FISSURE 10/13/2010   Qualifier: Diagnosis of  By: Nils Pyle CMA ([place]), Mearl Latin    . S/P Nissen fundoplication (without gastrostomy tube) procedure 05/25/2017  . Sleep apnea   . THYROID CANCER 10/13/2010   Qualifier: Diagnosis of  By:  Nils Pyle CMA ([place]), Mearl Latin    . Thyroid cancer [facility])    s/p thyroidectomy  . Type II diabetes mellitus ([place])    type II    Past Surgical History:  Procedure Laterality Date  . ABDOMINAL HYSTERECTOMY     "partial"  . BREAST BIOPSY Right   . BREAST LUMPECTOMY Right   . CARDIOVERSION N/A 03/30/2014   Procedure: CARDIOVERSION - BEDSIDE;  Surgeon: [name], MD;  Location: [place];  Service: Cardiovascular;  Laterality: N/A;  . CARDIOVERSION N/A 03/31/2014   Procedure: CARDIOVERSION  (BEDSIDE) ;  Surgeon: Lelon Perla, MD;  Location: [place];  Service: Cardiovascular;  Laterality: N/A;  . CARDIOVERSION N/A 04/27/2014  Procedure: CARDIOVERSION;  Surgeon: Darlin Coco, MD;  Location: [place];  Service: Cardiovascular;  Laterality: N/A;  . CARDIOVERSION N/A 04/13/2015   Procedure: CARDIOVERSION;  Surgeon: Thayer Headings, MD;  Location: [place];  Service: Cardiovascular;  Laterality: N/A;  . CARDIOVERSION N/A 01/08/2017   Procedure: CARDIOVERSION;  Surgeon: Sanda Klein, MD;  Location: [facility] ENDOSCOPY;  Service: Cardiovascular;  Laterality: N/A;  . CARDIOVERSION N/A 09/19/2017   Procedure: CARDIOVERSION;  Surgeon: [name], MD;  Location: [facility] ENDOSCOPY;  Service: Cardiovascular;  Laterality: N/A;  . CARDIOVERSION N/A 10/25/2017   Procedure: CARDIOVERSION;  Surgeon: Acie Fredrickson Wonda Cheng, MD;  Location: [place];  Service: Cardiovascular;  Laterality: N/A;  . CARPAL TUNNEL RELEASE Right   . CATARACT EXTRACTION W/ INTRAOCULAR LENS  IMPLANT, BILATERAL Bilateral   . COLONOSCOPY    . EXCISIONAL HEMORRHOIDECTOMY    . INSERTION OF MESH N/A 05/25/2017   Procedure: INSERTION OF MESH;  Surgeon: Ralene Ok, MD;  Location: [facility];  Service: General;  Laterality: N/A;  . TEE WITHOUT CARDIOVERSION N/A 03/30/2014   Procedure: TRANSESOPHAGEAL ECHOCARDIOGRAM (TEE);  Surgeon: [name], MD;  Location: [facility] ENDOSCOPY;  Service: Cardiovascular;  Laterality: N/A;  . THYROIDECTOMY    .  TONSILLECTOMY      Current Outpatient Medications  Medication Sig Dispense Refill  . acetaminophen (TYLENOL) 650 MG CR tablet Take 650 mg by mouth every 8 (eight) hours as needed for pain.     Marland Kitchen amiodarone (PACERONE) 200 MG tablet TAKE 1 TABLET BY MOUTH  DAILY 90 tablet 3  . diltiazem (CARDIZEM CD) 300 MG 24 hr capsule Take 1 capsule (300 mg total) by mouth daily. 90 capsule 3  . ELIQUIS 5 MG TABS tablet TAKE 1 TABLET BY MOUTH TWO  TIMES DAILY 180 tablet 3  . ferrous sulfate 325 (65 FE) MG tablet Take 325 mg by mouth daily with breakfast.    . fluticasone (FLONASE) 50 MCG/ACT nasal spray Place 1 spray into both nostrils at bedtime.     . hydrochlorothiazide (HYDRODIURIL) 25 MG tablet Take 1 tablet (25 mg total) by mouth daily. (Patient taking differently: Take 25 mg by mouth as needed. ) 90 tablet 3  . lansoprazole (PREVACID) 30 MG capsule Take 30 mg by mouth as directed.    Marland Kitchen levothyroxine (SYNTHROID, LEVOTHROID) 175 MCG tablet Take 175 mcg by mouth daily before breakfast.    . LORazepam (ATIVAN) 1 MG tablet Take 1.5 mg by mouth at bedtime.    Marland Kitchen losartan (COZAAR) 50 MG tablet Take 1 tablet (50 mg total) by mouth daily. 90 tablet 3  . Lutein 6 MG CAPS Take 6 mg by mouth daily.     . meclizine (ANTIVERT) 12.5 MG tablet 1 tablet daily.    . metFORMIN (GLUMETZA) 500 MG (MOD) 24 hr tablet Take 250 mg by mouth 2 (two) times daily with a meal.     . metoprolol tartrate (LOPRESSOR) 25 MG tablet Take 1 tablet (25 mg total) by mouth 2 (two) times daily. 60 tablet 11  . Multiple Vitamins-Minerals (ICAPS AREDS 2) CAPS Take 1 capsule by mouth 2 (two) times daily.     . Polyvinyl Alcohol-Povidone PF (REFRESH) 1.4-0.6 % SOLN Place 1 drop into both eyes at bedtime.    . potassium chloride (KLOR-CON) 20 MEQ packet Take 20 mEq by mouth daily.     . rosuvastatin (CRESTOR) 10 MG tablet Take 10 mg by mouth at bedtime.    . sertraline (ZOLOFT) 50 MG tablet Take 50  mg by mouth daily.    . vitamin B-12  (CYANOCOBALAMIN) 500 MCG tablet Take 500 mcg by mouth daily as needed (energy).     No current facility-administered medications for this encounter.     Allergies  Allergen Reactions  . Penicillins Itching, Rash and Other (See Comments)    Has patient had a PCN reaction causing immediate rash, facial/tongue/throat swelling, SOB or lightheadedness with hypotension: Unknown, childhood reaction Has patient had a PCN reaction causing severe rash involving mucus membranes or skin necrosis: No Has patient had a PCN reaction that required hospitalization No Has patient had a PCN reaction occurring within the last 10 years: No If all of the above answers are "NO", then may proceed with Cephalosporin use.     Social History   Socioeconomic History  . Marital status: Divorced    Spouse name: Not on file  . Number of children: 2  . Years of education: Not on file  . Highest education level: Not on file  Occupational History  . Occupation: Retired    Comment: Education officer, museum  Social Needs  . Financial resource strain: Not on file  . Food insecurity:    Worry: Not on file    Inability: Not on file  . Transportation needs:    Medical: Not on file    Non-medical: Not on file  Tobacco Use  . Smoking status: Never Smoker  . Smokeless tobacco: Never Used  Substance and Sexual Activity  . Alcohol use: No    Alcohol/week: 0.0 standard drinks  . Drug use: No  . Sexual activity: Never  Lifestyle  . Physical activity:    Days per week: Not on file    Minutes per session: Not on file  . Stress: Not on file  Relationships  . Social connections:    Talks on phone: Not on file    Gets together: Not on file    Attends religious service: Not on file    Active member of club or organization: Not on file    Attends meetings of clubs or organizations: Not on file    Relationship status: Not on file  . Intimate partner violence:    Fear of current or ex partner: Not on file    Emotionally  abused: Not on file    Physically abused: Not on file    Forced sexual activity: Not on file  Other Topics Concern  . Not on file  Social History Narrative  . Not on file    Family History  Problem Relation Age of Onset  . Prostate cancer Father   . Diabetes Sister   . Heart disease Sister   . Heart attack Brother   . Heart disease Brother   . Diabetes Brother   . Diabetes Sister   . Diabetes Sister   . Pancreatic cancer Sister   . Diabetes Brother   . Heart disease Brother   . Diabetes Brother   . Heart disease Brother   . Diabetes Brother   . Heart disease Brother   . Diabetes Brother   . Heart disease Brother   . Diabetes Brother   . Heart disease Brother   . Diabetes Brother   . Diabetes Child     ROS- All systems are reviewed and negative except as per the HPI above  Physical Exam: Vitals:   12/12/18 1043  BP: 112/68  Pulse: 77  Weight: 88 kg  Height: 5\' 6"  (1.676 m)   Wt Readings from  Last 3 Encounters:  12/12/18 88 kg  12/05/18 88.9 kg  10/28/18 87.1 kg    Labs: Lab Results  Component Value Date   NA 140 10/15/2018   K 4.3 10/15/2018   CL 104 10/15/2018   CO2 20 10/15/2018   GLUCOSE 91 10/15/2018   BUN 19 10/15/2018   CREATININE 1.00 10/15/2018   CALCIUM 9.3 10/15/2018   MG 2.2 06/07/2017   Lab Results  Component Value Date   INR 1.12 04/20/2016   No results found for: CHOL, HDL, LDLCALC, TRIG   GEN- The patient is well appearing, alert and oriented x 3 today.   HEENT-head normocephalic, atraumatic, sclera clear, conjunctiva pink, hearing intact, trachea midline. Lungs- Clear to ausculation bilaterally, normal work of breathing Heart- irregular rate and rhythm, no murmurs, rubs or gallops  GI- soft, NT, ND, + BS Extremities- no clubbing, cyanosis, or edema MS- no significant deformity or atrophy Skin- no rash or lesion Psych- euthymic mood, full affect Neuro- strength and sensation are intact   EKG- atrial fibrillation HR 77,  QRS 102, QTc 479  Epic records reviewed Echo 04/24/18 - Left ventricle: The cavity size was normal. There was moderate   concentric hypertrophy. Systolic function was vigorous. The   estimated ejection fraction was in the range of 65% to 70%. Wall   motion was normal; there were no regional wall motion   abnormalities. Features are consistent with a pseudonormal left   ventricular filling pattern, with concomitant abnormal relaxation   and increased filling pressure (grade 2 diastolic dysfunction). - Aortic valve: There was mild regurgitation. - Mitral valve: Calcified annulus. Mildly thickened leaflets .   There was mild regurgitation. - Left atrium: The atrium was moderately dilated. - Right atrium: The atrium was mildly dilated. - Tricuspid valve: There was mild regurgitation. - Pulmonary arteries: Systolic pressure was mildly to moderately   increased. PA peak pressure: 40 mm Hg (S). - Inferior vena cava: The vessel was normal in size. The   respirophasic diameter changes were in the normal range (= 50%),   consistent with normal central venous pressure. - Pericardium, extracardiac: There was no pericardial effusion.   Assessment and Plan: 1. Persistent atrial fibrillation Had done well on amiodarone for about 1 year with no symptoms. Her afib has been persistent since her last visit. Will arrange for DCCV. Continue eliquis 5 mg bid for chadsvasc score of at least 5, states no missed doses Continue amiodarone 200 mg daily Check Cmet/TSH/CBC Continue metoprolol 25 mg BID for now, Decrease to 12.5 mg BID night before DCCV given baseline bradycardia in SR.  2. OSA Compliant with CPAP therapy.  3. HTN Stable, no changes today.  F/u after DCCV 1 week.    [facility] [address] [place], [place] [address] [phone]

## 2018-12-12 NOTE — H&P (View-Only) (Signed)
Primary Care Physician: Lavone Orn, MD Cardiologist: [name] Referral provider: Truitt Merle, NP Pulmonologist: Dr. Elsworth Soho GI: Dr. Dolphus Jenny I [name] is a 85 y.o. female with a h/o afib that has been on amiodarone since June of 2015. She had afib 12/2016, in the setting of persistent cough x 6-8 weeks  that had not responded to two rounds of prednisone and antibiotics. She had also been using cough syrup with decongestants. Her Cardizem was increased and  BB was added with good slowing of v rate and with slower heart rate, she does feel better but persists in afib. She is not really wanting a cardioversion. Her biggest concern was  cough. She did have a CXR and other than a large hiatal hernia, there were no other abnormalities noted. TSH had also been mildly elevated but PCP aware and was monitoring. .Continues on eliquis. She did have repair of Hiatal hernia 05/25/17.  She is in the  afib clinic for return to afib 7-10 days ago. She had been trying to wean off amiodarone with concerns re her lungs and her eye doctor could see evidence of amio use, but she states that her vision had not changed. CT chest in March did not show any evidence of lung toxicity for amiodarone. She is pending another eval with lung doctor 11/13.  She increased her amio back to 200 mg a day about 10 days ago.  We discussed options to amiodarone, with either Tikosyn or sotalol and would have to let amio wash out and may take 2-3 months before eligible for the other drugs. She does not think she can tolerate afib for that period of time. We also discussed possible av nodal ablation and PPM but she does not like this option as well. For now she is happy to use the amiodarone. No missed doses of eliquis.She is tolerating the afib.  F/u 12/05/2018. I have not seen pt in over one year and in that period of time she has been maintaining  SR.  Unfortunately, she has not felt well lately, asked to be seen and in rate  controlled afib. 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 No missed doses of eliquis.   F/u afib clinic 12/12/18. Patient is in rate controlled afib today. She admits that she has continued to have palpitations and feel weak since her last visit.   Today, she denies symptoms of chest pain, shortness of breath, orthopnea, PND, lower extremity edema, dizziness, presyncope, syncope, or neurologic sequela. The patient is tolerating medications without difficulties and is otherwise without complaint today.   Past Medical History:  Diagnosis Date  . Acute bronchitis 11/15/2017  . ADENOCARCINOMA, BREAST 10/13/2010   Qualifier: Diagnosis of  By: Nils Pyle CMA (AAMA), Mearl Latin    . 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 Allergy    SEASONAL  . Anemia    years ago  . ANEMIA, IRON DEFICIENCY 10/14/2010   Qualifier: Diagnosis of  By: Sharlett Iles MD Byrd Hesselbach   . Anemia, unspecified 10/13/2010   Qualifier: Diagnosis of  By: Nils Pyle CMA ([place]), Mearl Latin    . Anticoagulated by anticoagulation treatment - Eliquis 03/30/2014   Started May 2015   . Anxiety state 10/13/2010   Qualifier: Diagnosis of  By: Nils Pyle CMA ([place]), Mearl Latin    . Arthritis    "all my joints" (03/31/2014)  . Atrial fibrillation ([place])   . Atrial fibrillation with RVR ([place])  03/31/2015   DLCO dropped from 100% in 2015-50% in 01/2017  . Barrett's esophagus   . Breast cancer [facility])    s/p right breast lumpectomy  . CARDIAC MURMUR 10/13/2010   Qualifier: Diagnosis of  By: Nils Pyle CMA ([place]), Mearl Latin    . 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 Cervical cancer ([place])   . Chronic cough 01/02/2017   GERD, hiatal hernia Doubt amio  . COLONIC POLYPS, ADENOMATOUS, HX OF 10/13/2010   Qualifier: Diagnosis of  By: Nils Pyle CMA (AAMA), Mearl Latin    . Degenerative disorder of eye 07/25/2012  . Diabetes mellitus without complication ([place])   . Dilated cardiomyopathy ([place]) 08/27/2018  . DIVERTICULOSIS,  COLON 10/13/2010   Qualifier: Diagnosis of  By: Nils Pyle CMA (AAMA), Mearl Latin    . DJD (degenerative joint disease) of knee   . Dysrhythmia    Afib  . EROSIVE ESOPHAGITIS 10/13/2010   Qualifier: Diagnosis of  By: Nils Pyle CMA ([place]), Mearl Latin    . Error, refractive, myopia 07/09/2012  . Essential hypertension 10/13/2010   Qualifier: Diagnosis of  By: Nils Pyle CMA ([place]), Mearl Latin    . Exotropia, intermittent 07/09/2012  . GERD 10/14/2010   Qualifier: Diagnosis of  By: Sharlett Iles MD Byrd Hesselbach GERD (gastroesophageal reflux disease)   . Glaucoma   . H/O hiatal hernia   . Heart murmur   . Hiatal hernia s/p robotic repair & fundoplication 02/19/8118 14/05/8294   Qualifier: Diagnosis of  By: Nils Pyle CMA (AAMA), Mearl Latin    . History of laser assisted in situ keratomileusis 07/25/2012  . Hx of transesophageal echocardiography (TEE) for monitoring    TEE (03/2014): No LAA clot, moderate LAE, core triatriatum type structure in RA with no stenosis, normal EF 55-60%, mild MR  . HYPERCHOLESTEROLEMIA 10/13/2010   Qualifier: Diagnosis of  By: Nils Pyle CMA ([place]), Mearl Latin    . Hypertension   . Hypothyroidism, postsurgical   . Irritable bowel syndrome 10/13/2010   Qualifier: Diagnosis of  By: Nils Pyle CMA (AAMA), Mearl Latin    . Lumbar stenosis with neurogenic claudication 04/20/2016  . Malignant neoplasm of breast ([place]) 02/25/2016  . Migraine    "last one was years ago" (03/31/2014)  . MIGRAINE HEADACHE 10/13/2010   Qualifier: Diagnosis of  By: Nils Pyle CMA ([place]), Mearl Latin    . 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 OSA on CPAP    settings at 10-11   . OSTEOARTHRITIS 10/13/2010   Qualifier: Diagnosis of  By: Nils Pyle CMA ([place]), Mearl Latin    . Post-surgical hypothyroidism 04/09/2014  . RECTAL FISSURE 10/13/2010   Qualifier: Diagnosis of  By: Nils Pyle CMA ([place]), Mearl Latin    . S/P Nissen fundoplication (without gastrostomy tube) procedure 05/25/2017  . Sleep apnea   . THYROID CANCER 10/13/2010   Qualifier: Diagnosis of  By:  Nils Pyle CMA ([place]), Mearl Latin    . Thyroid cancer [facility])    s/p thyroidectomy  . Type II diabetes mellitus ([place])    type II    Past Surgical History:  Procedure Laterality Date  . ABDOMINAL HYSTERECTOMY     "partial"  . BREAST BIOPSY Right   . BREAST LUMPECTOMY Right   . CARDIOVERSION N/A 03/30/2014   Procedure: CARDIOVERSION - BEDSIDE;  Surgeon: [name], MD;  Location: [facility];  Service: Cardiovascular;  Laterality: N/A;  . CARDIOVERSION N/A 03/31/2014   Procedure: CARDIOVERSION  (BEDSIDE) ;  Surgeon: Lelon Perla, MD;  Location: [facility];  Service: Cardiovascular;  Laterality: N/A;  . CARDIOVERSION N/A 04/27/2014  Procedure: CARDIOVERSION;  Surgeon: Darlin Coco, MD;  Location: [facility];  Service: Cardiovascular;  Laterality: N/A;  . CARDIOVERSION N/A 04/13/2015   Procedure: CARDIOVERSION;  Surgeon: Thayer Headings, MD;  Location: [place] [facility];  Service: Cardiovascular;  Laterality: N/A;  . CARDIOVERSION N/A 01/08/2017   Procedure: CARDIOVERSION;  Surgeon: Sanda Klein, MD;  Location[facility] END[facility]COPY;  Service: Cardiovascular;  Laterality: N/A;  . CARDIOVERSION N/A 09/19/2017   Procedure: CARDIOVERSION;  Surgeon: [name], MD;  Location: [facility] ENDOSCOPY;  Service: Cardiovascular;  Laterality: N/A;  . CARDIOVERSION N/A 10/25/2017   Procedure: CARDIOVERSION;  Surgeon: Acie Fredrickson Wonda Cheng, MD;  Location: [facility];  Service: Cardiovascular;  Laterality: N/A;  . CARPAL TUNNEL RELEASE Right   . CATARACT EXTRACTION W/ INTRAOCULAR LENS  IMPLANT, BILATERAL Bilateral   . COLONOSCOPY    . EXCISIONAL HEMORRHOIDECTOMY    . INSERTION OF MESH N/A 05/25/2017   Procedure: INSERTION OF MESH;  Surgeon: Ralene Ok, MD;  Location: [facility];  Service: General;  Laterality: N/A;  . TEE WITHOUT CARDIOVERSION N/A 03/30/2014   Procedure: TRANSESOPHAGEAL ECHOCARDIOGRAM (TEE);  Surgeon: [name], MD;  Location[facility] END[facility]COPY;  Service: Cardiovascular;  Laterality: N/A;  . THYROIDECTOMY    .  TONSILLECTOMY      Current Outpatient Medications  Medication Sig Dispense Refill  . acetaminophen (TYLENOL) 650 MG CR tablet Take 650 mg by mouth every 8 (eight) hours as needed for pain.     Marland Kitchen amiodarone (PACERONE) 200 MG tablet TAKE 1 TABLET BY MOUTH  DAILY 90 tablet 3  . diltiazem (CARDIZEM CD) 300 MG 24 hr capsule Take 1 capsule (300 mg total) by mouth daily. 90 capsule 3  . ELIQUIS 5 MG TABS tablet TAKE 1 TABLET BY MOUTH TWO  TIMES DAILY 180 tablet 3  . ferrous sulfate 325 (65 FE) MG tablet Take 325 mg by mouth daily with breakfast.    . fluticasone (FLONASE) 50 MCG/ACT nasal spray Place 1 spray into both nostrils at bedtime.     . hydrochlorothiazide (HYDRODIURIL) 25 MG tablet Take 1 tablet (25 mg total) by mouth daily. (Patient taking differently: Take 25 mg by mouth as needed. ) 90 tablet 3  . lansoprazole (PREVACID) 30 MG capsule Take 30 mg by mouth as directed.    Marland Kitchen levothyroxine (SYNTHROID, LEVOTHROID) 175 MCG tablet Take 175 mcg by mouth daily before breakfast.    . LORazepam (ATIVAN) 1 MG tablet Take 1.5 mg by mouth at bedtime.    Marland Kitchen losartan (COZAAR) 50 MG tablet Take 1 tablet (50 mg total) by mouth daily. 90 tablet 3  . Lutein 6 MG CAPS Take 6 mg by mouth daily.     . meclizine (ANTIVERT) 12.5 MG tablet 1 tablet daily.    . metFORMIN (GLUMETZA) 500 MG (MOD) 24 hr tablet Take 250 mg by mouth 2 (two) times daily with a meal.     . metoprolol tartrate (LOPRESSOR) 25 MG tablet Take 1 tablet (25 mg total) by mouth 2 (two) times daily. 60 tablet 11  . Multiple Vitamins-Minerals (ICAPS AREDS 2) CAPS Take 1 capsule by mouth 2 (two) times daily.     . Polyvinyl Alcohol-Povidone PF (REFRESH) 1.4-0.6 % SOLN Place 1 drop into both eyes at bedtime.    . potassium chloride (KLOR-CON) 20 MEQ packet Take 20 mEq by mouth daily.     . rosuvastatin (CRESTOR) 10 MG tablet Take 10 mg by mouth at bedtime.    . sertraline (ZOLOFT) 50 MG tablet Take 50  mg by mouth daily.    . vitamin B-12  (CYANOCOBALAMIN) 500 MCG tablet Take 500 mcg by mouth daily as needed (energy).     No current facility-administered medications for this encounter.     Allergies  Allergen Reactions  . Penicillins Itching, Rash and Other (See Comments)    Has patient had a PCN reaction causing immediate rash, facial/tongue/throat swelling, SOB or lightheadedness with hypotension: Unknown, childhood reaction Has patient had a PCN reaction causing severe rash involving mucus membranes or skin necrosis: No Has patient had a PCN reaction that required hospitalization No Has patient had a PCN reaction occurring within the last 10 years: No If all of the above answers are "NO", then may proceed with Cephalosporin use.     Social History   Socioeconomic History  . Marital status: Divorced    Spouse name: Not on file  . Number of children: 2  . Years of education: Not on file  . Highest education level: Not on file  Occupational History  . Occupation: Retired    Comment: Education officer, [employer]  Social Needs  . Financial resource strain: Not on file  . Food insecurity:    Worry: Not on file    Inability: Not on file  . Transportation needs:    Medical: Not on file    Non-medical: Not on file  Tobacco Use  . Smoking status: Never Smoker  . Smokeless tobacco: Never Used  Substance and Sexual Activity  . Alcohol use: No    Alcohol/week: 0.0 standard drinks  . Drug use: No  . Sexual activity: Never  Lifestyle  . Physical activity:    Days per week: Not on file    Minutes per session: Not on file  . Stress: Not on file  Relationships  . Social connections:    Talks on phone: Not on file    Gets together: Not on file    Attends religious service: Not on file    Active member of club or organization: Not on file    Attends meetings of clubs or organizations: Not on file    Relationship status: Not on file  . Intimate partner violence:    Fear of current or ex partner: Not on file    Emotionally  abused: Not on file    Physically abused: Not on file    Forced sexual activity: Not on file  Other Topics Concern  . Not on file  Social History Narrative  . Not on file    Family History  Problem Relation Age of Onset  . Prostate cancer Father   . Diabetes Sister   . Heart disease Sister   . Heart attack Brother   . Heart disease Brother   . Diabetes Brother   . Diabetes Sister   . Diabetes Sister   . Pancreatic cancer Sister   . Diabetes Brother   . Heart disease Brother   . Diabetes Brother   . Heart disease Brother   . Diabetes Brother   . Heart disease Brother   . Diabetes Brother   . Heart disease Brother   . Diabetes Brother   . Heart disease Brother   . Diabetes Brother   . Diabetes Child     ROS- All systems are reviewed and negative except as per the HPI above  Physical Exam: Vitals:   12/12/18 1043  BP: 112/68  Pulse: 77  Weight: 88 kg  Height: 5\' 6"  (1.676 m)   Wt Readings from  Last 3 Encounters:  12/12/18 88 kg  12/05/18 88.9 kg  10/28/18 87.1 kg    Labs: Lab Results  Component Value Date   NA 140 10/15/2018   K 4.3 10/15/2018   CL 104 10/15/2018   CO2 20 10/15/2018   GLUCOSE 91 10/15/2018   BUN 19 10/15/2018   CREATININE 1.00 10/15/2018   CALCIUM 9.3 10/15/2018   MG 2.2 06/07/2017   Lab Results  Component Value Date   INR 1.12 04/20/2016   No results found for: CHOL, HDL, LDLCALC, TRIG   GEN- The patient is well appearing, alert and oriented x 3 today.   HEENT-head normocephalic, atraumatic, sclera clear, conjunctiva pink, hearing intact, trachea midline. Lungs- Clear to ausculation bilaterally, normal work of breathing Heart- irregular rate and rhythm, no murmurs, rubs or gallops  GI- soft, NT, ND, + BS Extremities- no clubbing, cyanosis, or edema MS- no significant deformity or atrophy Skin- no rash or lesion Psych- euthymic mood, full affect Neuro- strength and sensation are intact   EKG- atrial fibrillation HR 77,  QRS 102, QTc 479  Epic records reviewed Echo 04/24/18 - Left ventricle: The cavity size was normal. There was moderate   concentric hypertrophy. Systolic function was vigorous. The   estimated ejection fraction was in the range of 65% to 70%. Wall   motion was normal; there were no regional wall motion   abnormalities. Features are consistent with a pseudonormal left   ventricular filling pattern, with concomitant abnormal relaxation   and increased filling pressure (grade 2 diastolic dysfunction). - Aortic valve: There was mild regurgitation. - Mitral valve: Calcified annulus. Mildly thickened leaflets .   There was mild regurgitation. - Left atrium: The atrium was moderately dilated. - Right atrium: The atrium was mildly dilated. - Tricuspid valve: There was mild regurgitation. - Pulmonary arteries: Systolic pressure was mildly to moderately   increased. PA peak pressure: 40 mm Hg (S). - Inferior vena cava: The vessel was normal in size. The   respirophasic diameter changes were in the normal range (= 50%),   consistent with normal central venous pressure. - Pericardium, extracardiac: There was no pericardial effusion.   Assessment and Plan: 1. Persistent atrial fibrillation Had done well on amiodarone for about 1 year with no symptoms. Her afib has been persistent since her last visit. Will arrange for DCCV. Continue eliquis 5 mg bid for chadsvasc score of at least 5, states no missed doses Continue amiodarone 200 mg daily Check Cmet/TSH/CBC Continue metoprolol 25 mg BID for now, Decrease to 12.5 mg BID night before DCCV given baseline bradycardia in SR.  2. OSA Compliant with CPAP therapy.  3. HTN Stable, no changes today.  F/u after DCCV 1 week.    [facility] [address] [place], [place] [address] [phone]

## 2018-12-12 NOTE — Patient Instructions (Addendum)
Cardioversion scheduled for Tuesday, February 11th  - Arrive at the [facility] and go to admitting at 12:30PM  -Do not eat or drink anything after midnight the night prior to your procedure.  - Take all your morning medication with a sip of water prior to arrival EXCEPT for diabetes medication.  - You will not be able to drive home after your procedure.   The night before cardioversion (2/10) decrease your metoprolol back to 1/2 tablet twice  A day

## 2018-12-18 ENCOUNTER — Telehealth (INDEPENDENT_AMBULATORY_CARE_PROVIDER_SITE_OTHER): Payer: Self-pay

## 2018-12-18 ENCOUNTER — Telehealth (INDEPENDENT_AMBULATORY_CARE_PROVIDER_SITE_OTHER): Payer: Self-pay | Admitting: Orthopedic Surgery

## 2018-12-18 NOTE — Telephone Encounter (Signed)
Patient called back to see if Dr. Marlou Sa had responded to previous message.

## 2018-12-18 NOTE — Telephone Encounter (Signed)
Pt called in asking if she needs to be off of Eloquest blood thinner in order to get a Gel injection?  [phone]

## 2018-12-18 NOTE — Telephone Encounter (Signed)
Patient was last seen by Dr. Junius Roads who mentioned the gel injection. Is this still ok? Message has not been sent to April to get approval yet

## 2018-12-18 NOTE — Telephone Encounter (Signed)
Please advised  

## 2018-12-18 NOTE — Telephone Encounter (Signed)
Nope pls call rthx

## 2018-12-19 ENCOUNTER — Telehealth (HOSPITAL_COMMUNITY): Payer: Self-pay | Admitting: *Deleted

## 2018-12-19 ENCOUNTER — Telehealth (INDEPENDENT_AMBULATORY_CARE_PROVIDER_SITE_OTHER): Payer: Self-pay

## 2018-12-19 NOTE — Telephone Encounter (Signed)
I called and advised the patient. She will receive a call to schedule an appointment once prior approval has been obtained.

## 2018-12-19 NOTE — Telephone Encounter (Signed)
Duplicate

## 2018-12-19 NOTE — Telephone Encounter (Signed)
Noted  

## 2018-12-19 NOTE — Telephone Encounter (Signed)
Dr. Junius Roads is ordering a hyaluronic acid injection for left knee OA.

## 2018-12-19 NOTE — Telephone Encounter (Signed)
Yes, ok to proceed with gel injection approval.

## 2018-12-19 NOTE — Telephone Encounter (Signed)
Patient has decided she needs to have her knee injections done prior to having cardioversion - cardioversion is canceled for 2/11 and pt will notify when 3 weeks of injections are completed to have this rescheduled. Pt states she was told she does not have to hold eliquis for injections.

## 2018-12-19 NOTE — Telephone Encounter (Signed)
OK to order hyaluronic acid injection for left knee?

## 2018-12-23 ENCOUNTER — Telehealth (INDEPENDENT_AMBULATORY_CARE_PROVIDER_SITE_OTHER): Payer: Self-pay | Admitting: Orthopedic Surgery

## 2018-12-23 NOTE — Telephone Encounter (Signed)
Patient advised that gel injection has not been approved.  Will receive a phone call once approved to schedule.

## 2018-12-23 NOTE — Telephone Encounter (Signed)
Patient checking on the status of the gel injection approval

## 2018-12-24 ENCOUNTER — Ambulatory Visit (HOSPITAL_COMMUNITY): Admit: 2018-12-24 | Payer: Medicare Other | Admitting: Cardiology

## 2018-12-24 ENCOUNTER — Other Ambulatory Visit (HOSPITAL_COMMUNITY): Payer: Self-pay | Admitting: *Deleted

## 2018-12-24 ENCOUNTER — Encounter (HOSPITAL_COMMUNITY): Payer: Self-pay

## 2018-12-24 SURGERY — CARDIOVERSION
Anesthesia: General

## 2018-12-25 ENCOUNTER — Ambulatory Visit (INDEPENDENT_AMBULATORY_CARE_PROVIDER_SITE_OTHER): Payer: Medicare Other | Admitting: Orthopedic Surgery

## 2018-12-27 ENCOUNTER — Ambulatory Visit (HOSPITAL_COMMUNITY): Payer: Medicare Other | Admitting: Anesthesiology

## 2018-12-27 ENCOUNTER — Other Ambulatory Visit: Payer: Self-pay

## 2018-12-27 ENCOUNTER — Ambulatory Visit (HOSPITAL_COMMUNITY)
Admission: RE | Admit: 2018-12-27 | Discharge: 2018-12-27 | Disposition: A | Discharge status: Home / Self Care | Payer: Medicare Other | Attending: Internal Medicine | Admitting: Internal Medicine

## 2018-12-27 ENCOUNTER — Encounter (HOSPITAL_COMMUNITY): Payer: Self-pay

## 2018-12-27 ENCOUNTER — Encounter (HOSPITAL_COMMUNITY)
Admission: RE | Disposition: A | Discharge status: Home / Self Care | Payer: Self-pay | Source: Home / Self Care | Attending: Internal Medicine

## 2018-12-27 DIAGNOSIS — Z7901 Long term (current) use of anticoagulants: Secondary | ICD-10-CM | POA: Insufficient documentation

## 2018-12-27 DIAGNOSIS — I4891 Unspecified atrial fibrillation: Secondary | ICD-10-CM

## 2018-12-27 DIAGNOSIS — Z7984 Long term (current) use of oral hypoglycemic drugs: Secondary | ICD-10-CM | POA: Diagnosis not present

## 2018-12-27 DIAGNOSIS — Z853 Personal history of malignant neoplasm of breast: Secondary | ICD-10-CM | POA: Diagnosis not present

## 2018-12-27 DIAGNOSIS — I480 Paroxysmal atrial fibrillation: Secondary | ICD-10-CM | POA: Diagnosis not present

## 2018-12-27 DIAGNOSIS — K219 Gastro-esophageal reflux disease without esophagitis: Secondary | ICD-10-CM | POA: Insufficient documentation

## 2018-12-27 DIAGNOSIS — Z79890 Hormone replacement therapy (postmenopausal): Secondary | ICD-10-CM | POA: Insufficient documentation

## 2018-12-27 DIAGNOSIS — E119 Type 2 diabetes mellitus without complications: Secondary | ICD-10-CM | POA: Diagnosis not present

## 2018-12-27 DIAGNOSIS — Z85850 Personal history of malignant neoplasm of thyroid: Secondary | ICD-10-CM | POA: Diagnosis not present

## 2018-12-27 DIAGNOSIS — R001 Bradycardia, unspecified: Secondary | ICD-10-CM | POA: Insufficient documentation

## 2018-12-27 DIAGNOSIS — G4733 Obstructive sleep apnea (adult) (pediatric): Secondary | ICD-10-CM | POA: Diagnosis not present

## 2018-12-27 DIAGNOSIS — I10 Essential (primary) hypertension: Secondary | ICD-10-CM | POA: Diagnosis not present

## 2018-12-27 DIAGNOSIS — M1990 Unspecified osteoarthritis, unspecified site: Secondary | ICD-10-CM | POA: Diagnosis not present

## 2018-12-27 DIAGNOSIS — I4819 Other persistent atrial fibrillation: Secondary | ICD-10-CM | POA: Insufficient documentation

## 2018-12-27 DIAGNOSIS — Z79899 Other long term (current) drug therapy: Secondary | ICD-10-CM | POA: Diagnosis not present

## 2018-12-27 HISTORY — PX: CARDIOVERSION: SHX1299

## 2018-12-27 LAB — POCT I-STAT 4, (NA,K, GLUC, HGB,HCT)
Glucose, Bld: 138 mg/dL — ABNORMAL HIGH (ref 70–99)
HCT: 39 % (ref 36.0–46.0)
HEMOGLOBIN: 13.3 g/dL (ref 12.0–15.0)
Potassium: 3.7 mmol/L (ref 3.5–5.1)
Sodium: 139 mmol/L (ref 135–145)

## 2018-12-27 SURGERY — CARDIOVERSION
Anesthesia: General

## 2018-12-27 MED ORDER — LIDOCAINE 2% (20 MG/ML) 5 ML SYRINGE
INTRAMUSCULAR | Status: DC | PRN
  Administered 2018-12-27: 60 mg via INTRAVENOUS

## 2018-12-27 MED ORDER — METOPROLOL TARTRATE 25 MG PO TABS: 12.5000 mg | ORAL_TABLET | Freq: Two times a day (BID) | ORAL | 11 refills | Status: DC

## 2018-12-27 MED ORDER — PROPOFOL 10 MG/ML IV BOLUS
INTRAVENOUS | Status: DC | PRN
  Administered 2018-12-27: 40 mg via INTRAVENOUS

## 2018-12-27 MED ORDER — SODIUM CHLORIDE 0.9 % IV SOLN
INTRAVENOUS | Status: AC | PRN
  Administered 2018-12-27: 500 mL via INTRAVENOUS

## 2018-12-27 NOTE — CV Procedure (Signed)
Procedure: Electrical Cardioversion Indications:  Atrial Fibrillation  Procedure Details:  Consent: Risks of procedure as well as the alternatives and risks of each were explained to the (patient/caregiver).  Consent for procedure obtained.  Time Out: Verified patient identification, verified procedure, site/side was marked, verified correct patient position, special equipment/implants available, medications/allergies/relevent history reviewed, required imaging and test results available. PERFORMED.  Patient placed on cardiac monitor, pulse oximetry, supplemental oxygen as necessary.  Sedation given: propofol per anesthesia Pacer pads placed anterior and posterior chest.  Cardioverted 1 time(s).  Cardioversion with synchronized biphasic 120J shock.  Evaluation: Findings: Post procedure EKG shows: NSR Complications: None Patient did tolerate procedure well.  Time Spent Directly with the Patient:  30 minutes   [name] 12/27/2018, 11:31 AM

## 2018-12-27 NOTE — Anesthesia Postprocedure Evaluation (Signed)
Anesthesia Post Note  Patient: [name]  Procedure(s) Performed: CARDIOVERSION (N/A )     Patient location during evaluation: PACU Anesthesia Type: General Level of consciousness: awake and alert Pain management: pain level controlled Vital Signs Assessment: post-procedure vital signs reviewed and stable Respiratory status: spontaneous breathing, nonlabored ventilation, respiratory function stable and patient connected to nasal cannula oxygen Cardiovascular status: blood pressure returned to baseline and stable Postop Assessment: no apparent nausea or vomiting Anesthetic complications: no    Last Vitals:  Vitals:   12/27/18 1140 12/27/18 1150  BP: 128/70 (!) 148/78  Pulse: (!) 46 (!) 49  Resp: (!) 27 (!) 28  Temp: (!) 36.3 C   SpO2: 100% 100%    Last Pain:  Vitals:   12/27/18 1150  TempSrc:   PainSc: 0-No pain                 Jalissa Heinzelman P Krissa Utke

## 2018-12-27 NOTE — Transfer of Care (Signed)
Immediate Anesthesia Transfer of Care Note  Patient: [name]  Procedure(s) Performed: CARDIOVERSION (N/A )  Patient Location: Endoscopy Unit  Anesthesia Type:General  Level of Consciousness: awake, alert , oriented and patient cooperative  Airway & Oxygen Therapy: Patient Spontanous Breathing and Patient connected to nasal cannula oxygen  Post-op Assessment: Report given to RN, Post -op Vital signs reviewed and stable and Patient moving all extremities  Post vital signs: Reviewed and stable  Last Vitals:  Vitals Value Taken Time  BP    Temp    Pulse    Resp    SpO2      Last Pain:  Vitals:   12/27/18 0952  TempSrc: Oral  PainSc: 0-No pain         Complications: No apparent anesthesia complications

## 2018-12-27 NOTE — Anesthesia Preprocedure Evaluation (Addendum)
Anesthesia Evaluation  Patient identified by MRN, date of birth, ID band Patient awake    Reviewed: Allergy & Precautions, NPO status , Patient's Chart, lab work & pertinent test results  Airway Mallampati: II  TM Distance: >3 FB Neck ROM: Full    Dental no notable dental hx.    Pulmonary sleep apnea and Continuous Positive Airway Pressure Ventilation ,    Pulmonary exam normal breath sounds clear to auscultation       Cardiovascular hypertension, Pt. on medications and Pt. on home beta blockers + dysrhythmias  Rhythm:Irregular Rate:Normal     Neuro/Psych  Headaches, Anxiety    GI/Hepatic Neg liver ROS, hiatal hernia, GERD  Medicated and Controlled,  Endo/Other  diabetes, Oral Hypoglycemic AgentsHypothyroidism   Renal/GU negative Renal ROS     Musculoskeletal negative musculoskeletal ROS (+)   Abdominal   Peds  Hematology HLD   Anesthesia Other Findings A-fib  Reproductive/Obstetrics                            Anesthesia Physical Anesthesia Plan  ASA: III  Anesthesia Plan: General   Post-op Pain Management:    Induction: Intravenous  PONV Risk Score and Plan: 3 and Propofol infusion and Treatment may vary due to age or medical condition  Airway Management Planned: Mask  Additional Equipment:   Intra-op Plan:   Post-operative Plan:   Informed Consent: I have reviewed the patients History and Physical, chart, labs and discussed the procedure including the risks, benefits and alternatives for the proposed anesthesia with the patient or authorized representative who has indicated his/her understanding and acceptance.     Dental advisory given  Plan Discussed with: CRNA  Anesthesia Plan Comments:         Anesthesia Quick Evaluation

## 2018-12-27 NOTE — Discharge Instructions (Signed)
Electrical Cardioversion, Care After °This sheet gives you information about how to care for yourself after your procedure. Your health care provider may also give you more specific instructions. If you have problems or questions, contact your health care provider. °What can I expect after the procedure? °After the procedure, it is common to have: °· Some redness on the skin where the shocks were given. °Follow these instructions at home: ° °· Do not drive for 24 hours if you were given a medicine to help you relax (sedative). °· Take over-the-counter and prescription medicines only as told by your health care provider. °· Ask your health care provider how to check your pulse. Check it often. °· Rest for 48 hours after the procedure or as told by your health care provider. °· Avoid or limit your caffeine use as told by your health care provider. °Contact a health care provider if: °· You feel like your heart is beating too quickly or your pulse is not regular. °· You have a serious muscle cramp that does not go away. °Get help right away if: ° °· You have discomfort in your chest. °· You are dizzy or you feel faint. °· You have trouble breathing or you are short of breath. °· Your speech is slurred. °· You have trouble moving an arm or leg on one side of your body. °· Your fingers or toes turn cold or blue. °This information is not intended to replace advice given to you by your health care provider. Make sure you discuss any questions you have with your health care provider. °Document Released: 08/20/2013 Document Revised: 06/02/2016 Document Reviewed: 05/05/2016 °Elsevier Interactive Patient Education © 2019 Elsevier Inc. ° °

## 2018-12-29 ENCOUNTER — Encounter (HOSPITAL_COMMUNITY): Payer: Self-pay | Admitting: Internal Medicine

## 2018-12-31 ENCOUNTER — Telehealth (INDEPENDENT_AMBULATORY_CARE_PROVIDER_SITE_OTHER): Payer: Self-pay

## 2018-12-31 NOTE — Telephone Encounter (Signed)
Submitted VOB for SynviscOne, left knee. 

## 2019-01-01 ENCOUNTER — Ambulatory Visit (HOSPITAL_COMMUNITY): Payer: Medicare Other | Admitting: Physician Assistant

## 2019-01-03 ENCOUNTER — Ambulatory Visit (HOSPITAL_COMMUNITY): Payer: Medicare Other | Admitting: Physician Assistant

## 2019-01-06 ENCOUNTER — Encounter (HOSPITAL_COMMUNITY): Payer: Self-pay | Admitting: Nurse Practitioner

## 2019-01-06 ENCOUNTER — Ambulatory Visit (HOSPITAL_COMMUNITY)
Admission: RE | Admit: 2019-01-06 | Discharge: 2019-01-06 | Disposition: A | Discharge status: Home / Self Care | Payer: Medicare Other | Source: Ambulatory Visit | Attending: Physician Assistant | Admitting: Physician Assistant

## 2019-01-06 VITALS — BP 130/70 | HR 53 | Ht 66.0 in | Wt 192.0 lb

## 2019-01-06 DIAGNOSIS — K219 Gastro-esophageal reflux disease without esophagitis: Secondary | ICD-10-CM | POA: Insufficient documentation

## 2019-01-06 DIAGNOSIS — F411 Generalized anxiety disorder: Secondary | ICD-10-CM | POA: Diagnosis not present

## 2019-01-06 DIAGNOSIS — Z833 Family history of diabetes mellitus: Secondary | ICD-10-CM | POA: Diagnosis not present

## 2019-01-06 DIAGNOSIS — Z79890 Hormone replacement therapy (postmenopausal): Secondary | ICD-10-CM | POA: Insufficient documentation

## 2019-01-06 DIAGNOSIS — E890 Postprocedural hypothyroidism: Secondary | ICD-10-CM | POA: Insufficient documentation

## 2019-01-06 DIAGNOSIS — Z800 Family history of malignant neoplasm of digestive organs: Secondary | ICD-10-CM | POA: Insufficient documentation

## 2019-01-06 DIAGNOSIS — D509 Iron deficiency anemia, unspecified: Secondary | ICD-10-CM | POA: Diagnosis not present

## 2019-01-06 DIAGNOSIS — Z880 Allergy status to penicillin: Secondary | ICD-10-CM | POA: Diagnosis not present

## 2019-01-06 DIAGNOSIS — Z8249 Family history of ischemic heart disease and other diseases of the circulatory system: Secondary | ICD-10-CM | POA: Insufficient documentation

## 2019-01-06 DIAGNOSIS — G4733 Obstructive sleep apnea (adult) (pediatric): Secondary | ICD-10-CM | POA: Diagnosis not present

## 2019-01-06 DIAGNOSIS — Z8042 Family history of malignant neoplasm of prostate: Secondary | ICD-10-CM | POA: Insufficient documentation

## 2019-01-06 DIAGNOSIS — R001 Bradycardia, unspecified: Secondary | ICD-10-CM | POA: Insufficient documentation

## 2019-01-06 DIAGNOSIS — Z853 Personal history of malignant neoplasm of breast: Secondary | ICD-10-CM | POA: Insufficient documentation

## 2019-01-06 DIAGNOSIS — E119 Type 2 diabetes mellitus without complications: Secondary | ICD-10-CM | POA: Diagnosis not present

## 2019-01-06 DIAGNOSIS — I10 Essential (primary) hypertension: Secondary | ICD-10-CM | POA: Diagnosis not present

## 2019-01-06 DIAGNOSIS — E7800 Pure hypercholesterolemia, unspecified: Secondary | ICD-10-CM | POA: Diagnosis not present

## 2019-01-06 DIAGNOSIS — Z85850 Personal history of malignant neoplasm of thyroid: Secondary | ICD-10-CM | POA: Diagnosis not present

## 2019-01-06 DIAGNOSIS — H409 Unspecified glaucoma: Secondary | ICD-10-CM | POA: Insufficient documentation

## 2019-01-06 DIAGNOSIS — Z7984 Long term (current) use of oral hypoglycemic drugs: Secondary | ICD-10-CM | POA: Insufficient documentation

## 2019-01-06 DIAGNOSIS — Z79899 Other long term (current) drug therapy: Secondary | ICD-10-CM | POA: Insufficient documentation

## 2019-01-06 DIAGNOSIS — Z8541 Personal history of malignant neoplasm of cervix uteri: Secondary | ICD-10-CM | POA: Diagnosis not present

## 2019-01-06 DIAGNOSIS — I420 Dilated cardiomyopathy: Secondary | ICD-10-CM | POA: Insufficient documentation

## 2019-01-06 DIAGNOSIS — I4819 Other persistent atrial fibrillation: Secondary | ICD-10-CM

## 2019-01-06 DIAGNOSIS — Z7901 Long term (current) use of anticoagulants: Secondary | ICD-10-CM | POA: Insufficient documentation

## 2019-01-06 DIAGNOSIS — R9431 Abnormal electrocardiogram [ECG] [EKG]: Secondary | ICD-10-CM | POA: Insufficient documentation

## 2019-01-06 NOTE — Progress Notes (Signed)
Primary Care Physician: Lavone Orn, MD Cardiologist: [name] Referral provider: Truitt Merle, NP Pulmonologist: Dr. Elsworth Soho GI: Dr. Dolphus Jenny I [name] is a 85 y.o. female with a h/o afib that has been on amiodarone since June of 2015. She had afib 12/2016, in the setting of persistent cough x 6-8 weeks  that had not responded to two rounds of prednisone and antibiotics. She had also been using cough syrup with decongestants. Her Cardizem was increased and  BB was added with good slowing of v rate and with slower heart rate, she does feel better but persists in afib. She is not really wanting a cardioversion. Her biggest concern was  cough. She did have a CXR and other than a large hiatal hernia, there were no other abnormalities noted. TSH had also been mildly elevated but PCP aware and was monitoring. .Continues on eliquis. She did have repair of Hiatal hernia 05/25/17.  She is in the  afib clinic for return to afib 7-10 days ago. She had been trying to wean off amiodarone with concerns re her lungs and her eye doctor could see evidence of amio use, but she states that her vision had not changed. CT chest in March did not show any evidence of lung toxicity for amiodarone. She is pending another eval with lung doctor 11/13.  She increased her amio back to 200 mg a day about 10 days ago.  We discussed options to amiodarone, with either Tikosyn or sotalol and would have to let amio wash out and may take 2-3 months before eligible for the other drugs. She does not think she can tolerate afib for that period of time. We also discussed possible av nodal ablation and PPM but she does not like this option as well. For now she is happy to use the amiodarone. No missed doses of eliquis.She is tolerating the afib.  F/u 12/05/2018. I have not seen pt in over one year and in that period of time she has been maintaining  SR.  Unfortunately, she has not felt well lately, asked to be seen and in rate  controlled afib. 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 No missed doses of eliquis.   F/u afib clinic 12/12/18. Patient is in rate controlled afib today. She admits that she has continued to have palpitations and feel weak since her last visit.   F/u in clinic, 01/06/19,she is here for f/u successful cardioversion. She remains in SR. She feels improved. She is wanting to have injections in her left knee now. They will not have to stop her anticoagulation, so she can go ahead and schedule.  Today, she denies symptoms of chest pain, shortness of breath, orthopnea, PND, lower extremity edema, dizziness, presyncope, syncope, or neurologic sequela. The patient is tolerating medications without difficulties and is otherwise without complaint today.   Past Medical History:  Diagnosis Date  . Acute bronchitis 11/15/2017  . ADENOCARCINOMA, BREAST 10/13/2010   Qualifier: Diagnosis of  By: Nils Pyle CMA (AAMA), Mearl Latin    . 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 Allergy    SEASONAL  . Anemia    years ago  . ANEMIA, IRON DEFICIENCY 10/14/2010   Qualifier: Diagnosis of  By: Sharlett Iles MD Byrd Hesselbach   . Anemia, unspecified 10/13/2010   Qualifier: Diagnosis of  By: Nils Pyle CMA ([place]), Mearl Latin    . Anticoagulated by anticoagulation treatment - Eliquis 03/30/2014   Started  May 2015   . Anxiety state 10/13/2010   Qualifier: Diagnosis of  By: Nils Pyle CMA ([place]), Mearl Latin    . Arthritis    "all my joints" (03/31/2014)  . Atrial fibrillation ([place])   . Atrial fibrillation with RVR ([place]) 03/31/2015   DLCO dropped from 100% in 2015-50% in 01/2017  . Barrett's esophagus   . Breast cancer [facility])    s/p right breast lumpectomy  . CARDIAC MURMUR 10/13/2010   Qualifier: Diagnosis of  By: Nils Pyle CMA ([place]), Mearl Latin    . 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 Cervical cancer ([place])   . Chronic cough 01/02/2017   GERD, hiatal hernia Doubt amio  . COLONIC  POLYPS, ADENOMATOUS, HX OF 10/13/2010   Qualifier: Diagnosis of  By: Nils Pyle CMA (AAMA), Mearl Latin    . Degenerative disorder of eye 07/25/2012  . Diabetes mellitus without complication ([place])   . Dilated cardiomyopathy ([place]) 08/27/2018  . DIVERTICULOSIS, COLON 10/13/2010   Qualifier: Diagnosis of  By: Nils Pyle CMA (AAMA), Mearl Latin    . DJD (degenerative joint disease) of knee   . Dysrhythmia    Afib  . EROSIVE ESOPHAGITIS 10/13/2010   Qualifier: Diagnosis of  By: Nils Pyle CMA ([place]), Mearl Latin    . Error, refractive, myopia 07/09/2012  . Essential hypertension 10/13/2010   Qualifier: Diagnosis of  By: Nils Pyle CMA ([place]), Mearl Latin    . Exotropia, intermittent 07/09/2012  . GERD 10/14/2010   Qualifier: Diagnosis of  By: Sharlett Iles MD Byrd Hesselbach GERD (gastroesophageal reflux disease)   . Glaucoma   . H/O hiatal hernia   . Heart murmur   . Hiatal hernia s/p robotic repair & fundoplication 03/22/2584 27/05/8241   Qualifier: Diagnosis of  By: Nils Pyle CMA (AAMA), Mearl Latin    . History of laser assisted in situ keratomileusis 07/25/2012  . Hx of transesophageal echocardiography (TEE) for monitoring    TEE (03/2014): No LAA clot, moderate LAE, core triatriatum type structure in RA with no stenosis, normal EF 55-60%, mild MR  . HYPERCHOLESTEROLEMIA 10/13/2010   Qualifier: Diagnosis of  By: Nils Pyle CMA ([place]), Mearl Latin    . Hypertension   . Hypothyroidism, postsurgical   . Irritable bowel syndrome 10/13/2010   Qualifier: Diagnosis of  By: Nils Pyle CMA (AAMA), Mearl Latin    . Lumbar stenosis with neurogenic claudication 04/20/2016  . Malignant neoplasm of breast ([place]) 02/25/2016  . Migraine    "last one was years ago" (03/31/2014)  . MIGRAINE HEADACHE 10/13/2010   Qualifier: Diagnosis of  By: Nils Pyle CMA ([place]), Mearl Latin    . 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 OSA on CPAP    settings at 10-11   . OSTEOARTHRITIS 10/13/2010   Qualifier: Diagnosis of  By: Nils Pyle CMA ([place]), Mearl Latin    . Post-surgical hypothyroidism  04/09/2014  . RECTAL FISSURE 10/13/2010   Qualifier: Diagnosis of  By: Nils Pyle CMA ([place]), Mearl Latin    . S/P Nissen fundoplication (without gastrostomy tube) procedure 05/25/2017  . Sleep apnea   . THYROID CANCER 10/13/2010   Qualifier: Diagnosis of  By: Nils Pyle CMA ([place]), Mearl Latin    . Thyroid cancer [facility])    s/p thyroidectomy  . Type II diabetes mellitus ([place])    type II    Past Surgical History:  Procedure Laterality Date  . ABDOMINAL HYSTERECTOMY     "partial"  . BREAST BIOPSY Right   . BREAST LUMPECTOMY Right   . CARDIOVERSION N/A 03/30/2014   Procedure: CARDIOVERSION - BEDSIDE;  Surgeon: [name],  MD;  Location: [place];  Service: Cardiovascular;  Laterality: N/A;  . CARDIOVERSION N/A 03/31/2014   Procedure: CARDIOVERSION  (BEDSIDE) ;  Surgeon: Lelon Perla, MD;  Location: [place];  Service: Cardiovascular;  Laterality: N/A;  . CARDIOVERSION N/A 04/27/2014   Procedure: CARDIOVERSION;  Surgeon: Darlin Coco, MD;  Location: [place];  Service: Cardiovascular;  Laterality: N/A;  . CARDIOVERSION N/A 04/13/2015   Procedure: CARDIOVERSION;  Surgeon: Thayer Headings, MD;  Location: [place];  Service: Cardiovascular;  Laterality: N/A;  . CARDIOVERSION N/A 01/08/2017   Procedure: CARDIOVERSION;  Surgeon: Sanda Klein, MD;  Location: [facility] ENDOSCOPY;  Service: Cardiovascular;  Laterality: N/A;  . CARDIOVERSION N/A 09/19/2017   Procedure: CARDIOVERSION;  Surgeon: [name], MD;  Location: [facility] ENDOSCOPY;  Service: Cardiovascular;  Laterality: N/A;  . CARDIOVERSION N/A 10/25/2017   Procedure: CARDIOVERSION;  Surgeon: Thayer Headings, MD;  Location: [facility] ENDOSCOPY;  Service: Cardiovascular;  Laterality: N/A;  . CARDIOVERSION N/A 12/27/2018   Procedure: CARDIOVERSION;  Surgeon: [name], MD;  Location: [facility] ENDOSCOPY;  Service: Cardiovascular;  Laterality: N/A;  . CARPAL TUNNEL RELEASE Right   . CATARACT EXTRACTION W/ INTRAOCULAR LENS  IMPLANT, BILATERAL Bilateral   .  COLONOSCOPY    . EXCISIONAL HEMORRHOIDECTOMY    . INSERTION OF MESH N/A 05/25/2017   Procedure: INSERTION OF MESH;  Surgeon: Ralene Ok, MD;  Location: [facility];  Service: General;  Laterality: N/A;  . TEE WITHOUT CARDIOVERSION N/A 03/30/2014   Procedure: TRANSESOPHAGEAL ECHOCARDIOGRAM (TEE);  Surgeon: [name], MD;  Location: [facility] ENDOSCOPY;  Service: Cardiovascular;  Laterality: N/A;  . THYROIDECTOMY    . TONSILLECTOMY      Current Outpatient Medications  Medication Sig Dispense Refill  . acetaminophen (TYLENOL) 650 MG CR tablet Take 650 mg by mouth every 8 (eight) hours as needed for pain.     Marland Kitchen amiodarone (PACERONE) 200 MG tablet TAKE 1 TABLET BY MOUTH  DAILY 90 tablet 3  . cetirizine (ZYRTEC) 10 MG tablet Take 10 mg by mouth daily as needed for allergies.    Marland Kitchen ELIQUIS 5 MG TABS tablet TAKE 1 TABLET BY MOUTH TWO  TIMES DAILY 180 tablet 3  . ferrous sulfate 325 (65 FE) MG tablet Take 325 mg by mouth daily with breakfast.    . fluticasone (FLONASE) 50 MCG/ACT nasal spray Place 1 spray into both nostrils at bedtime.     . hydrochlorothiazide (HYDRODIURIL) 25 MG tablet Take 1 tablet (25 mg total) by mouth daily. (Patient taking differently: Take 25 mg by mouth daily as needed (swelling). ) 90 tablet 3  . lansoprazole (PREVACID) 30 MG capsule Take 30 mg by mouth daily.     Marland Kitchen levothyroxine (SYNTHROID, LEVOTHROID) 175 MCG tablet Take 175 mcg by mouth daily before breakfast.    . LORazepam (ATIVAN) 1 MG tablet Take 1.5 mg by mouth See admin instructions. Take 1.5 mg by mouth at night, may take 0.5 mg during the day as needed for anxiety    . losartan (COZAAR) 50 MG tablet Take 1 tablet (50 mg total) by mouth daily. 90 tablet 3  . Lutein 6 MG CAPS Take 6 mg by mouth 3 (three) times a week.     . meclizine (ANTIVERT) 12.5 MG tablet Take 12.5 mg by mouth daily as needed for dizziness.     . Menthol-Methyl Salicylate (MUSCLE RUB) 10-15 % CREA Apply 1 application topically as needed for muscle  pain.    . metFORMIN (GLUCOPHAGE-XR) 500 MG 24  hr tablet Take 250-500 mg by mouth 2 (two) times daily.    . metoprolol tartrate (LOPRESSOR) 25 MG tablet Take 0.5 tablets (12.5 mg total) by mouth 2 (two) times daily. 60 tablet 11  . Multiple Vitamins-Minerals (ICAPS AREDS 2) CAPS Take 1 capsule by mouth daily.     . Polyvinyl Alcohol-Povidone PF (REFRESH) 1.4-0.6 % SOLN Place 1 drop into both eyes daily as needed (dry eyes).     . potassium chloride (KLOR-CON) 20 MEQ packet Take 20 mEq by mouth daily.     . rosuvastatin (CRESTOR) 10 MG tablet Take 10 mg by mouth at bedtime.    . sertraline (ZOLOFT) 50 MG tablet Take 50 mg by mouth daily.    . vitamin B-12 (CYANOCOBALAMIN) 500 MCG tablet Take 500 mcg by mouth daily as needed (energy).     No current facility-administered medications for this encounter.     Allergies  Allergen Reactions  . Penicillins Itching, Rash and Other (See Comments)    Did it involve swelling of the face/tongue/throat, SOB, or low BP? No Did it involve sudden or severe rash/hives, skin peeling, or any reaction on the inside of your mouth or nose? No Did you need to seek medical attention at a hospital or doctor's office? No When did it last happen?30+ years If all above answers are "NO", may proceed with cephalosporin use.     Social History   Socioeconomic History  . Marital status: Divorced    Spouse name: Not on file  . Number of children: 2  . Years of education: Not on file  . Highest education level: Not on file  Occupational History  . Occupation: Retired    Comment: Education officer, museum  Social Needs  . Financial resource strain: Not on file  . Food insecurity:    Worry: Not on file    Inability: Not on file  . Transportation needs:    Medical: Not on file    Non-medical: Not on file  Tobacco Use  . Smoking status: Never Smoker  . Smokeless tobacco: Never Used  Substance and Sexual Activity  . Alcohol use: No    Alcohol/week: 0.0 standard  drinks  . Drug use: No  . Sexual activity: Never  Lifestyle  . Physical activity:    Days per week: Not on file    Minutes per session: Not on file  . Stress: Not on file  Relationships  . Social connections:    Talks on phone: Not on file    Gets together: Not on file    Attends religious service: Not on file    Active member of club or organization: Not on file    Attends meetings of clubs or organizations: Not on file    Relationship status: Not on file  . Intimate partner violence:    Fear of current or ex partner: Not on file    Emotionally abused: Not on file    Physically abused: Not on file    Forced sexual activity: Not on file  Other Topics Concern  . Not on file  Social History Narrative  . Not on file    Family History  Problem Relation Age of Onset  . Prostate cancer Father   . Diabetes Sister   . Heart disease Sister   . Heart attack Brother   . Heart disease Brother   . Diabetes Brother   . Diabetes Sister   . Diabetes Sister   . Pancreatic cancer Sister   .  Diabetes Brother   . Heart disease Brother   . Diabetes Brother   . Heart disease Brother   . Diabetes Brother   . Heart disease Brother   . Diabetes Brother   . Heart disease Brother   . Diabetes Brother   . Heart disease Brother   . Diabetes Brother   . Diabetes Child     ROS- All systems are reviewed and negative except as per the HPI above  Physical Exam: Vitals:   01/06/19 1430  BP: 130/70  Pulse: (!) 53  SpO2: 96%  Weight: 87.1 kg  Height: 5\' 6"  (1.676 m)   Wt Readings from Last 3 Encounters:  01/06/19 87.1 kg  12/27/18 85.7 kg  12/12/18 88 kg    Labs: Lab Results  Component Value Date   NA 139 12/27/2018   K 3.7 12/27/2018   CL 107 12/12/2018   CO2 25 12/12/2018   GLUCOSE 138 (H) 12/27/2018   BUN 18 12/12/2018   CREATININE 0.97 12/12/2018   CALCIUM 9.1 12/12/2018   MG 2.2 06/07/2017   Lab Results  Component Value Date   INR 1.12 04/20/2016   No results  found for: CHOL, HDL, LDLCALC, TRIG   GEN- The patient is well appearing, alert and oriented x 3 today.   HEENT-head normocephalic, atraumatic, sclera clear, conjunctiva pink, hearing intact, trachea midline. Lungs- Clear to ausculation bilaterally, normal work of breathing Heart- regular rate and rhythm, no murmurs, rubs or gallops  GI- soft, NT, ND, + BS Extremities- no clubbing, cyanosis, or edema MS- no significant deformity or atrophy Skin- no rash or lesion Psych- euthymic mood, full affect Neuro- strength and sensation are intact   EKG-sinus brady at 53 bpm, PR int 192 ms, qrs int 96 ms, qtc 454 ms Epic records reviewed Echo 04/24/18 - Left ventricle: The cavity size was normal. There was moderate   concentric hypertrophy. Systolic function was vigorous. The   estimated ejection fraction was in the range of 65% to 70%. Wall   motion was normal; there were no regional wall motion   abnormalities. Features are consistent with a pseudonormal left   ventricular filling pattern, with concomitant abnormal relaxation   and increased filling pressure (grade 2 diastolic dysfunction). - Aortic valve: There was mild regurgitation. - Mitral valve: Calcified annulus. Mildly thickened leaflets .   There was mild regurgitation. - Left atrium: The atrium was moderately dilated. - Right atrium: The atrium was mildly dilated. - Tricuspid valve: There was mild regurgitation. - Pulmonary arteries: Systolic pressure was mildly to moderately   increased. PA peak pressure: 40 mm Hg (S). - Inferior vena cava: The vessel was normal in size. The   respirophasic diameter changes were in the normal range (= 50%),   consistent with normal central venous pressure. - Pericardium, extracardiac: There was no pericardial effusion.   Assessment and Plan: 1. Persistent atrial fibrillation Has maintained SR on amiodarone for about 1 year with no breakthrough afib  Recent return of afib, S/p successful  cardioversion 2/14 Continue eliquis 5 mg bid for chadsvasc score of at least 5, states no missed doses Continue amiodarone 200 mg daily Continue metoprolol 12.5  mg BID   2. OSA Compliant with CPAP therapy.  3. HTN Stable, no changes today.  F/u with Dr. Copper in May afib clinic as needed   Geroge Baseman. Shuayb Schepers, [facility] [address] [place], [place] [address] [phone]

## 2019-01-09 ENCOUNTER — Other Ambulatory Visit: Payer: Self-pay | Admitting: Internal Medicine

## 2019-01-09 DIAGNOSIS — M810 Age-related osteoporosis without current pathological fracture: Secondary | ICD-10-CM

## 2019-01-09 NOTE — Interval H&P Note (Signed)
History and Physical Interval Note:  01/09/2019 10:14 AM  [name]  has presented today for surgery, with the diagnosis of afib  The various methods of treatment have been discussed with the patient and family. After consideration of risks, benefits and other options for treatment, the patient has consented to  Procedure(s): CARDIOVERSION (N/A) as a surgical intervention .  The patient's history has been reviewed, patient examined, no change in status, stable for surgery.  I have reviewed the patient's chart and labs.  Questions were answered to the patient's satisfaction.     [name]

## 2019-01-13 ENCOUNTER — Telehealth (INDEPENDENT_AMBULATORY_CARE_PROVIDER_SITE_OTHER): Payer: Self-pay

## 2019-01-13 NOTE — Telephone Encounter (Signed)
Talked with patietn and advised her that she is approved for gel injection.  Approved for SynviscOne, left knee. Buy & Bill Covered at 100% after Co-pay Co-pay of $90.00 ( required) No PA required  Appt. 01/17/2019 with Dr. Junius Roads

## 2019-01-15 ENCOUNTER — Telehealth: Payer: Self-pay

## 2019-01-15 NOTE — Telephone Encounter (Signed)
Patient with diagnosis of afib, s/p cardioversion on 2/14 on Eliquis for anticoagulation.    Procedure: tooth extraction Date of procedure: TBD  CHADS2-VASc score of  6 (CHF, HTN, AGE, DM2, stroke/tia x 2, CAD, AGE, female)  Per office protocol, patient does NOT need to hold Eliquis prior to procedure.

## 2019-01-15 NOTE — Telephone Encounter (Signed)
   [facility] Pre-operative Risk Assessment    Request for surgical clearance:  1. What type of surgery is being performed? Removal of tooth   2. When is this surgery scheduled? TBD   3. What type of clearance is required (medical clearance vs. Pharmacy clearance to hold med vs. Both)? Pharmacy  4. Are there any medications that need to be held prior to surgery and how long? Eliquis   5. Practice name and name of physician performing surgery? [facility]/Dr. Tamela Oddi   6. What is your office phone number [phone]    7.   What is your office fax number [phone]  8.   Anesthesia type (None, local, MAC, general) ? Local    [name] 01/15/2019, 1:45 PM  _________________________________________________________________   (provider comments below)

## 2019-01-17 ENCOUNTER — Encounter (INDEPENDENT_AMBULATORY_CARE_PROVIDER_SITE_OTHER): Payer: Self-pay | Admitting: Family Medicine

## 2019-01-17 ENCOUNTER — Ambulatory Visit (INDEPENDENT_AMBULATORY_CARE_PROVIDER_SITE_OTHER): Payer: Medicare Other | Admitting: Family Medicine

## 2019-01-17 DIAGNOSIS — M1712 Unilateral primary osteoarthritis, left knee: Secondary | ICD-10-CM | POA: Diagnosis not present

## 2019-01-17 NOTE — Progress Notes (Signed)
Office Visit Note   Patient: [name]           Date of Birth: [birth year]           MRN: [id number] Visit Date: 01/17/2019 Requested by: Lavone Orn, MD [address] [place], [place] [address] PCP: Lavone Orn, MD  Subjective: Chief Complaint  Patient presents with  . Left Knee - Pain, Follow-up    Synvisc One injection    HPI: She is here with worsening left knee pain.  Longstanding history of arthritis, she has done well with Visco supplementation in the past.  Over the past month or 2 her pain has gotten steadily worse and she is requesting another injection.  No mechanical symptoms.              ROS: Noncontributory  Objective: Vital Signs: LMP  (LMP Unknown)   Physical Exam:  Left knee: Trace effusion, no warmth or erythema.  Slightly tender on the medial lateral joint lines.  Imaging: None today.  Assessment & Plan: 1.  Left knee DJD -Discussed with patient and elected to proceed with ultrasound-guided Synvisc-1 injection.  Ultrasound will be used to ensure intra-articular placement.  Follow-up as needed.     Procedures: Ultrasound-guided left knee Synvisc-1 injection: After sterile prep with Betadine, injected 5 cc 1% lidocaine without epinephrine and Synvisc-1 from superolateral approach, guiding the needle into the lateral joint recess.  Injectate was seen filling the joint capsule, and a flash of clear yellow synovial fluid was obtained prior to injection further confirming intra-articular placement.    PMFS History: Patient Active Problem List   Diagnosis Date Noted  . Paroxysmal atrial fibrillation (HCC)   . Dilated cardiomyopathy ([place]) 08/27/2018  . Acute bronchitis 11/15/2017  . S/P Nissen fundoplication (without gastrostomy tube) procedure 05/25/2017  . Persistent atrial fibrillation   . Chronic cough 01/02/2017  . Lumbar stenosis with neurogenic claudication 04/20/2016  . 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 Malignant  neoplasm of breast ([place]) 02/25/2016  . 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 Post-surgical hypothyroidism 04/09/2014  . Anticoagulated by anticoagulation treatment - Eliquis 03/30/2014  . Diabetes mellitus without complication ([place])   . Heart murmur   . DJD (degenerative joint disease) of knee   . Glaucoma   . History of laser assisted in situ keratomileusis 07/25/2012  . Degenerative disorder of eye 07/25/2012  . Exotropia, intermittent 07/09/2012  . 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 Error, refractive, myopia 07/09/2012  . ANEMIA, IRON DEFICIENCY 10/14/2010  . GERD 10/14/2010  . THYROID CANCER 10/13/2010  . HYPERCHOLESTEROLEMIA 10/13/2010  . ANEMIA, UNSPECIFIED 10/13/2010  . Anxiety state 10/13/2010  . MIGRAINE HEADACHE 10/13/2010  . Essential hypertension 10/13/2010  . EROSIVE ESOPHAGITIS 10/13/2010  . BARRETTS ESOPHAGUS 10/13/2010  . Hiatal hernia s/p robotic repair & fundoplication 1/95/0932 67/10/4579  . DIVERTICULOSIS, COLON 10/13/2010  . Irritable bowel syndrome 10/13/2010  . OSTEOARTHRITIS 10/13/2010  . CARDIAC MURMUR 10/13/2010  . COLONIC POLYPS, ADENOMATOUS, HX OF 10/13/2010   Past Medical History:  Diagnosis Date  . Acute bronchitis 11/15/2017  . ADENOCARCINOMA, BREAST 10/13/2010   Qualifier: Diagnosis of  By: Nils Pyle CMA (AAMA), Mearl Latin    . 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 Allergy    SEASONAL  . Anemia    years ago  . ANEMIA, IRON DEFICIENCY 10/14/2010   Qualifier: Diagnosis of  By: Sharlett Iles MD Byrd Hesselbach   . Anemia, unspecified 10/13/2010   Qualifier: Diagnosis of  By: Nils Pyle  CMA (AAMA), Mearl Latin    . Anticoagulated by anticoagulation treatment - Eliquis 03/30/2014   Started May 2015   . Anxiety state 10/13/2010   Qualifier: Diagnosis of  By: Nils Pyle CMA ([place]), Mearl Latin    . Arthritis    "all my joints" (03/31/2014)  . Atrial fibrillation ([place])   . Atrial fibrillation with RVR ([place]) 03/31/2015   DLCO dropped from 100%  in 2015-50% in 01/2017  . Barrett's esophagus   . Breast cancer [facility])    s/p right breast lumpectomy  . CARDIAC MURMUR 10/13/2010   Qualifier: Diagnosis of  By: Nils Pyle CMA ([place]), Mearl Latin    . 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 Cervical cancer ([place])   . Chronic cough 01/02/2017   GERD, hiatal hernia Doubt amio  . COLONIC POLYPS, ADENOMATOUS, HX OF 10/13/2010   Qualifier: Diagnosis of  By: Nils Pyle CMA (AAMA), Mearl Latin    . Degenerative disorder of eye 07/25/2012  . Diabetes mellitus without complication ([place])   . Dilated cardiomyopathy ([place]) 08/27/2018  . DIVERTICULOSIS, COLON 10/13/2010   Qualifier: Diagnosis of  By: Nils Pyle CMA (AAMA), Mearl Latin    . DJD (degenerative joint disease) of knee   . Dysrhythmia    Afib  . EROSIVE ESOPHAGITIS 10/13/2010   Qualifier: Diagnosis of  By: Nils Pyle CMA ([place]), Mearl Latin    . Error, refractive, myopia 07/09/2012  . Essential hypertension 10/13/2010   Qualifier: Diagnosis of  By: Nils Pyle CMA ([place]), Mearl Latin    . Exotropia, intermittent 07/09/2012  . GERD 10/14/2010   Qualifier: Diagnosis of  By: Sharlett Iles MD Byrd Hesselbach GERD (gastroesophageal reflux disease)   . Glaucoma   . H/O hiatal hernia   . Heart murmur   . Hiatal hernia s/p robotic repair & fundoplication 11/18/2692 85/02/6269   Qualifier: Diagnosis of  By: Nils Pyle CMA (AAMA), Mearl Latin    . History of laser assisted in situ keratomileusis 07/25/2012  . Hx of transesophageal echocardiography (TEE) for monitoring    TEE (03/2014): No LAA clot, moderate LAE, core triatriatum type structure in RA with no stenosis, normal EF 55-60%, mild MR  . HYPERCHOLESTEROLEMIA 10/13/2010   Qualifier: Diagnosis of  By: Nils Pyle CMA ([place]), Mearl Latin    . Hypertension   . Hypothyroidism, postsurgical   . Irritable bowel syndrome 10/13/2010   Qualifier: Diagnosis of  By: Nils Pyle CMA (AAMA), Mearl Latin    . Lumbar stenosis with neurogenic claudication 04/20/2016  . Malignant neoplasm of breast ([place]) 02/25/2016  . Migraine    "last one was  years ago" (03/31/2014)  . MIGRAINE HEADACHE 10/13/2010   Qualifier: Diagnosis of  By: Nils Pyle CMA ([place]), Mearl Latin    . 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 OSA on CPAP    settings at 10-11   . OSTEOARTHRITIS 10/13/2010   Qualifier: Diagnosis of  By: Nils Pyle CMA ([place]), Mearl Latin    . Post-surgical hypothyroidism 04/09/2014  . RECTAL FISSURE 10/13/2010   Qualifier: Diagnosis of  By: Nils Pyle CMA ([place]), Mearl Latin    . S/P Nissen fundoplication (without gastrostomy tube) procedure 05/25/2017  . Sleep apnea   . THYROID CANCER 10/13/2010   Qualifier: Diagnosis of  By: Nils Pyle CMA ([place]), Mearl Latin    . Thyroid cancer [facility])    s/p thyroidectomy  . Type II diabetes mellitus ([place])    type II     Family History  Problem Relation Age of Onset  . Prostate cancer Father   . Diabetes Sister   . Heart disease Sister   .  Heart attack Brother   . Heart disease Brother   . Diabetes Brother   . Diabetes Sister   . Diabetes Sister   . Pancreatic cancer Sister   . Diabetes Brother   . Heart disease Brother   . Diabetes Brother   . Heart disease Brother   . Diabetes Brother   . Heart disease Brother   . Diabetes Brother   . Heart disease Brother   . Diabetes Brother   . Heart disease Brother   . Diabetes Brother   . Diabetes Child     Past Surgical History:  Procedure Laterality Date  . ABDOMINAL HYSTERECTOMY     "partial"  . BREAST BIOPSY Right   . BREAST LUMPECTOMY Right   . CARDIOVERSION N/A 03/30/2014   Procedure: CARDIOVERSION - BEDSIDE;  Surgeon: [name], MD;  Location: [place];  Service: Cardiovascular;  Laterality: N/A;  . CARDIOVERSION N/A 03/31/2014   Procedure: CARDIOVERSION  (BEDSIDE) ;  Surgeon: Lelon Perla, MD;  Location: [place];  Service: Cardiovascular;  Laterality: N/A;  . CARDIOVERSION N/A 04/27/2014   Procedure: CARDIOVERSION;  Surgeon: Darlin Coco, MD;  Location: [place];  Service: Cardiovascular;  Laterality: N/A;  . CARDIOVERSION N/A 04/13/2015    Procedure: CARDIOVERSION;  Surgeon: Thayer Headings, MD;  Location: [place];  Service: Cardiovascular;  Laterality: N/A;  . CARDIOVERSION N/A 01/08/2017   Procedure: CARDIOVERSION;  Surgeon: Sanda Klein, MD;  Location: [facility] ENDOSCOPY;  Service: Cardiovascular;  Laterality: N/A;  . CARDIOVERSION N/A 09/19/2017   Procedure: CARDIOVERSION;  Surgeon: [name], MD;  Location[facility] ENDOSCOPY;  Service: Cardiovascular;  Laterality: N/A;  . CARDIOVERSION N/A 10/25/2017   Procedure: CARDIOVERSION;  Surgeon: Thayer Headings, MD;  [facility];  Service: Cardiovascular;  Laterality: N/A;  . CARDIOVERSION N/A 12/27/2018   Procedure: CARDIOVERSION;  Surgeon: [name], MD;  Location: [facility] ENDOSCOPY;  Service: Cardiovascular;  Laterality: N/A;  . CARPAL TUNNEL RELEASE Right   . CATARACT EXTRACTION W/ INTRAOCULAR LENS  IMPLANT, BILATERAL Bilateral   . COLONOSCOPY    . EXCISIONAL HEMORRHOIDECTOMY    . INSERTION OF MESH N/A 05/25/2017   Procedure: INSERTION OF MESH;  Surgeon: Ralene Ok, MD;  Location: [facility];  Service: General;  Laterality: N/A;  . TEE WITHOUT CARDIOVERSION N/A 03/30/2014   Procedure: TRANSESOPHAGEAL ECHOCARDIOGRAM (TEE);  Surgeon: [name], MD;  Location: [facility] ENDOSCOPY;  Service: Cardiovascular;  Laterality: N/A;  . THYROIDECTOMY    . TONSILLECTOMY     Social History   Occupational History  . Occupation: Retired    Comment: Education officer, museum  Tobacco Use  . Smoking status: Never Smoker  . Smokeless tobacco: Never Used  Substance and Sexual Activity  . Alcohol use: No    Alcohol/week: 0.0 standard drinks  . Drug use: No  . Sexual activity: Never

## 2019-02-05 ENCOUNTER — Encounter

## 2019-02-05 ENCOUNTER — Ambulatory Visit: Payer: Medicare Other | Admitting: Neurology

## 2019-02-24 ENCOUNTER — Other Ambulatory Visit (HOSPITAL_COMMUNITY): Payer: Self-pay | Admitting: Nurse Practitioner

## 2019-03-05 ENCOUNTER — Telehealth (INDEPENDENT_AMBULATORY_CARE_PROVIDER_SITE_OTHER): Payer: Self-pay | Admitting: Orthopedic Surgery

## 2019-03-05 NOTE — Telephone Encounter (Signed)
[name] had a Synvisc 1 injection with Dr. Junius Roads on 01/17/19.  She feels like it did not help much at all, and it actually may have made it worse.  She would like to discuss what she can do to help her knee.

## 2019-03-06 NOTE — Telephone Encounter (Signed)
I called: she did not have much relief at all from the Synvisc One injection. She continues to have pain with weightbearing, especially with "that first step" - sharp pain. Not much swelling. Knee rarely gives way. No pain when not bearing weight. She is taking Tylenol Arthritis - she can no longer take any NSAIDS, as she is in & out of A. Fib. She wants to know if trying a gel injection in a series of 3 is worth trying (worked for her in the past). I explained that what she got was supposed to be comparable to the series. Also, is it just time she get her knee replaced? Please advise.

## 2019-03-06 NOTE — Telephone Encounter (Signed)
Unfortunately insurance won't cover the series of three injections until 6 months after the one we just did.  Could do cortisone instead if she'd like.  Or could consult with surgeon about replacement.

## 2019-03-07 ENCOUNTER — Telehealth: Payer: Self-pay | Admitting: Cardiovascular Disease

## 2019-03-07 NOTE — Telephone Encounter (Signed)
Informed patient her doctor will need to send over a clearance request for procedure. She was grateful for call agrees with treatment plan.

## 2019-03-07 NOTE — Telephone Encounter (Signed)
The patient is advised. She will contact her cardiologist to see if she can have the cortisone injection (due to her A fib). If ok, she will call us back to schedule an appointment.

## 2019-03-07 NOTE — Telephone Encounter (Signed)
New Message   Pt is calling because she is wondering if she can get a Cortizone shot in her knee She doesn't know if she is aloud because of her afib   Please call

## 2019-03-10 ENCOUNTER — Telehealth (INDEPENDENT_AMBULATORY_CARE_PROVIDER_SITE_OTHER): Payer: Self-pay | Admitting: Orthopedic Surgery

## 2019-03-10 ENCOUNTER — Telehealth (INDEPENDENT_AMBULATORY_CARE_PROVIDER_SITE_OTHER): Payer: Self-pay | Admitting: Family Medicine

## 2019-03-10 ENCOUNTER — Telehealth: Payer: Self-pay | Admitting: Cardiovascular Disease

## 2019-03-10 NOTE — Telephone Encounter (Signed)
° ° °  ° °  [facility] Pre-operative Risk Assessment    Request for surgical clearance:  1. What type of surgery is being performed? CORTISONE INJECTION  2. When is this surgery scheduled? TBD  3. What type of clearance is required (medical clearance vs. Pharmacy clearance to hold med vs. Both)? BOTH  4. Are there any medications that need to be held prior to surgery and how long?   5. Practice name and name of physician performing surgery? DR MICHAEL HILTS, [facility]  6. What is your office phone number [phone]   7.   What is your office fax number [phone]  8.   Anesthesia type (None, local, MAC, general) ?    [place] 03/10/2019, 11:47 AM  _________________________________________________________________   (provider comments below)

## 2019-03-10 NOTE — Telephone Encounter (Signed)
Patient has synvisc one injection in the left knee in March by Dr. Junius Roads. She feels like it didn't do anything.  She would like to try the cortisone if her Cardiologist- Dr. Sherren Mocha will approve.  Please send a request to the Cardiologist if this is an option:  Phone number & fax below:     [phone]

## 2019-03-10 NOTE — Telephone Encounter (Signed)
Looks like Dr Junius Roads has been the one treating patient most recently over last several months. Please see if he will advise, if not I can talk with Dr Marlou Sa about it.

## 2019-03-10 NOTE — Telephone Encounter (Signed)
I called Dr. Antionette Char office (spoke with Margreta Journey ?sp) - left a message with her asking if it is ok for the patient to receive a cortisone injection or a toradol injection in her knee - patient has a fib. I called the patient and advised her we are just awaiting a response from the cardiologist. I will call her once we hear back.

## 2019-03-11 NOTE — Telephone Encounter (Signed)
Preop Pool - please call and clarify where cortisone will be injected and if there will be anesthesia used. If so, what type.  Pharmacy, please comment on holding anticoagulation. Appears greater than 4 weeks out from cardioversion.

## 2019-03-11 NOTE — Telephone Encounter (Signed)
Pt takes Eliquis for afib with CHADS2VASc score of 6 (age x2, sex, CHF, HTN, DM), s/p DCCV on 12/27/18. Renal function is normal.  Anticoagulation hold depends on location of injection as well. Most recent note from afib clinic mentions injection in patient's knee in which case pt would not need to hold her Eliquis.

## 2019-03-11 NOTE — Telephone Encounter (Signed)
Cards office called today ( Angie PA) and said that an injection would be fine. That she would fill out paperwork to send to [place] for clearance and that a cortisone injection in the knee would not require cards clearance and she would explain this to the pt that she would be ok to proceed.

## 2019-03-11 NOTE — Telephone Encounter (Signed)
   Primary Cardiologist: Sherren Mocha, MD  Chart reviewed as part of pre-operative protocol coverage. Patient was contacted 03/11/2019 in reference to pre-operative risk assessment for pending surgery as outlined below.  [name] was last seen on 01/06/19 by Roderic Palau NP.  Since that day, [name] has done well.  I clarified with the requesting office that the patient will undergo a cortisone injection in her knee.   Per our pharmacy staff: Pt takes Eliquis for afib with CHADS2VASc score of 6 (age x2, sex, CHF, HTN, DM), s/p DCCV on 12/27/18. Renal function is normal.  Anticoagulation hold depends on location of injection as well. Most recent note from afib clinic mentions injection in patient's knee in which case pt would not need to hold her Eliquis  The patient is aware that she does not need to hold eliquis for this procedure.  Therefore, based on ACC/AHA guidelines, the patient would be at acceptable risk for the planned procedure without further cardiovascular testing.   I will route this recommendation to the requesting party via Epic fax function and remove from pre-op pool.  Please call with questions.  Tami Lin Auset Fritzler, PA 03/11/2019, 1:10 PM

## 2019-03-12 NOTE — Telephone Encounter (Signed)
I called the patient. She would like to see Dr. Marlou Sa for the injection. Appointment scheduled for 03/20/2019 at 10:30. Faxed clearance from cardiology given to Dr. Randel Pigg assistant.

## 2019-03-14 ENCOUNTER — Other Ambulatory Visit: Payer: Medicare Other

## 2019-03-19 ENCOUNTER — Encounter: Payer: Self-pay | Admitting: Orthopedic Surgery

## 2019-03-19 ENCOUNTER — Other Ambulatory Visit: Payer: Self-pay

## 2019-03-19 ENCOUNTER — Ambulatory Visit: Payer: Medicare Other | Admitting: Orthopedic Surgery

## 2019-03-19 DIAGNOSIS — M1712 Unilateral primary osteoarthritis, left knee: Secondary | ICD-10-CM

## 2019-03-19 MED ORDER — LIDOCAINE HCL 1 % IJ SOLN
5.0000 mL | INTRAMUSCULAR | Status: AC | PRN
  Administered 2019-03-19: 5 mL

## 2019-03-19 MED ORDER — BUPIVACAINE HCL 0.25 % IJ SOLN
4.0000 mL | INTRAMUSCULAR | Status: AC | PRN
  Administered 2019-03-19: 4 mL via INTRA_ARTICULAR

## 2019-03-19 MED ORDER — METHYLPREDNISOLONE ACETATE 40 MG/ML IJ SUSP
40.0000 mg | INTRAMUSCULAR | Status: AC | PRN
  Administered 2019-03-19: 11:00:00 40 mg via INTRA_ARTICULAR

## 2019-03-19 NOTE — Progress Notes (Addendum)
Office Visit Note   Patient: [name]           Date of Birth: 1931-05-23           MRN: [id number] Visit Date: 03/19/2019 Requested by: Natasha Orn, MD [address] [place], [place] [address] PCP: Natasha Orn, MD  Subjective: Chief Complaint  Patient presents with  . Left Knee - Pain    HPI: [name] is a patient with known left knee arthritis.  She reports continued pain.  She had good relief from a gel injection 2 years ago but less relief with 1 about 3 months ago.  She is requesting cortisone injection today.  Reports pain with ambulation.              ROS: All systems reviewed are negative as they relate to the chief complaint within the history of present illness.  Patient denies  fevers or chills.   Assessment & Plan: Visit Diagnoses:  1. Unilateral primary osteoarthritis, left knee     Plan: Impression is left knee pain with arthritis.  Cortisone injection performed today.  We could repeat that in about 4 5 months if it helps.  Follow-up with me as needed.  She does not really want to proceed with any type of major surgery at this time which is understandable.  Follow-Up Instructions: Return if symptoms worsen or fail to improve.   Orders:  No orders of the defined types were placed in this encounter.  No orders of the defined types were placed in this encounter.     Procedures: Large Joint Inj: L knee on 03/19/2019 11:08 AM Indications: diagnostic evaluation, joint swelling and pain Details: 18 G 1.5 in needle, superolateral approach  Arthrogram: No  Medications: 5 mL lidocaine 1 %; 40 mg methylPREDNISolone acetate 40 MG/ML; 4 mL bupivacaine 0.25 % Outcome: tolerated well, no immediate complications Procedure, treatment alternatives, risks and benefits explained, specific risks discussed. Consent was given by the patient. Immediately prior to procedure a time out was called to verify the correct patient, procedure, equipment, support staff  and site/side marked as required. Patient was prepped and draped in the usual sterile fashion.       Clinical Data: No additional findings.  Objective: Vital Signs: LMP  (LMP Unknown)   Physical Exam:   Constitutional: Patient appears well-developed HEENT:  Head: Normocephalic Eyes:EOM are normal Neck: Normal range of motion Cardiovascular: Normal rate Pulmonary/chest: Effort normal Neurologic: Patient is alert Skin: Skin is warm Psychiatric: Patient has normal mood and affect    Ortho Exam: Ortho exam demonstrates full active and passive range of motion of the foot and hip.  That left knee has slight valgus alignment with palpable pedal pulses.  Range of motion is about full extension to over 90 of flexion.  No effusion is present.  No other masses lymphadenopathy or skin changes noted in that knee.  Specialty Comments:  No specialty comments available.  Imaging: No results found.   PMFS History: Patient Active Problem List   Diagnosis Date Noted  . Paroxysmal atrial fibrillation (HCC)   . Dilated cardiomyopathy ([facility]) 08/27/2018  . Acute bronchitis 11/15/2017  . S/P Nissen fundoplication (without gastrostomy tube) procedure 05/25/2017  . Persistent atrial fibrillation   . Chronic cough 01/02/2017  . Lumbar stenosis with neurogenic claudication 04/20/2016  . 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 Malignant neoplasm of breast ([facility] [place]) 02/25/2016  . 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 Post-surgical hypothyroidism 04/09/2014  .  Anticoagulated by anticoagulation treatment - Eliquis 03/30/2014  . Diabetes mellitus without complication ([facility])   . Heart murmur   . DJD (degenerative joint disease) of knee   . Glaucoma   . History of laser assisted in situ keratomileusis 07/25/2012  . Degenerative disorder of eye 07/25/2012  . Exotropia, intermittent 07/09/2012  . 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 Error, refractive, myopia 07/09/2012  . ANEMIA, IRON  DEFICIENCY 10/14/2010  . GERD 10/14/2010  . THYROID CANCER 10/13/2010  . HYPERCHOLESTEROLEMIA 10/13/2010  . ANEMIA, UNSPECIFIED 10/13/2010  . Anxiety state 10/13/2010  . MIGRAINE HEADACHE 10/13/2010  . Essential hypertension 10/13/2010  . EROSIVE ESOPHAGITIS 10/13/2010  . BARRETTS ESOPHAGUS 10/13/2010  . Hiatal hernia s/p robotic repair & fundoplication 8/67/6195 09/32/6712  . DIVERTICULOSIS, COLON 10/13/2010  . Irritable bowel syndrome 10/13/2010  . OSTEOARTHRITIS 10/13/2010  . CARDIAC MURMUR 10/13/2010  . COLONIC POLYPS, ADENOMATOUS, HX OF 10/13/2010   Past Medical History:  Diagnosis Date  . Acute bronchitis 11/15/2017  . ADENOCARCINOMA, BREAST 10/13/2010   Qualifier: Diagnosis of  By: Nils Pyle CMA (AAMA), Mearl Latin    . 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 Allergy    SEASONAL  . Anemia    years ago  . ANEMIA, IRON DEFICIENCY 10/14/2010   Qualifier: Diagnosis of  By: Sharlett Iles MD Byrd Hesselbach   . Anemia, unspecified 10/13/2010   Qualifier: Diagnosis of  By: Nils Pyle CMA ([place]), Mearl Latin    . Anticoagulated by anticoagulation treatment - Eliquis 03/30/2014   Started May 2015   . Anxiety state 10/13/2010   Qualifier: Diagnosis of  By: Nils Pyle CMA ([place]), Mearl Latin    . Arthritis    "all my joints" (03/31/2014)  . Atrial fibrillation ([facility])   . Atrial fibrillation with RVR ([facility]) 03/31/2015   DLCO dropped from 100% in 2015-50% in 01/2017  . Barrett's esophagus   . Breast cancer [facility])    s/p right breast lumpectomy  . CARDIAC MURMUR 10/13/2010   Qualifier: Diagnosis of  By: Nils Pyle CMA ([place]), Mearl Latin    . 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 Cervical cancer ([facility])   . Chronic cough 01/02/2017   GERD, hiatal hernia Doubt amio  . COLONIC POLYPS, ADENOMATOUS, HX OF 10/13/2010   Qualifier: Diagnosis of  By: Nils Pyle CMA (AAMA), Mearl Latin    . Degenerative disorder of eye 07/25/2012  . Diabetes mellitus without complication ([place])   . Dilated cardiomyopathy ([place]) 08/27/2018  .  DIVERTICULOSIS, COLON 10/13/2010   Qualifier: Diagnosis of  By: Nils Pyle CMA (AAMA), Mearl Latin    . DJD (degenerative joint disease) of knee   . Dysrhythmia    Afib  . EROSIVE ESOPHAGITIS 10/13/2010   Qualifier: Diagnosis of  By: Nils Pyle CMA ([place]), Mearl Latin    . Error, refractive, myopia 07/09/2012  . Essential hypertension 10/13/2010   Qualifier: Diagnosis of  By: Nils Pyle CMA ([place]), Mearl Latin    . Exotropia, intermittent 07/09/2012  . GERD 10/14/2010   Qualifier: Diagnosis of  By: Sharlett Iles MD Byrd Hesselbach GERD (gastroesophageal reflux disease)   . Glaucoma   . H/O hiatal hernia   . Heart murmur   . Hiatal hernia s/p robotic repair & fundoplication 4/58/0998 33/06/2504   Qualifier: Diagnosis of  By: Nils Pyle CMA (AAMA), Mearl Latin    . History of laser assisted in situ keratomileusis 07/25/2012  . Hx of transesophageal echocardiography (TEE) for monitoring    TEE (03/2014): No LAA clot, moderate LAE, core triatriatum type structure in RA with no stenosis, normal EF 55-60%, mild  MR  . HYPERCHOLESTEROLEMIA 10/13/2010   Qualifier: Diagnosis of  By: Nils Pyle CMA (AAMA), Mearl Latin    . Hypertension   . Hypothyroidism, postsurgical   . Irritable bowel syndrome 10/13/2010   Qualifier: Diagnosis of  By: Nils Pyle CMA (AAMA), Mearl Latin    . Lumbar stenosis with neurogenic claudication 04/20/2016  . Malignant neoplasm of breast ([place]) 02/25/2016  . Migraine    "last one was years ago" (03/31/2014)  . MIGRAINE HEADACHE 10/13/2010   Qualifier: Diagnosis of  By: Nils Pyle CMA ([place]), Mearl Latin    . 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 OSA on CPAP    settings at 10-11   . OSTEOARTHRITIS 10/13/2010   Qualifier: Diagnosis of  By: Nils Pyle CMA ([place]), Mearl Latin    . Post-surgical hypothyroidism 04/09/2014  . RECTAL FISSURE 10/13/2010   Qualifier: Diagnosis of  By: Nils Pyle CMA ([place]), Mearl Latin    . S/P Nissen fundoplication (without gastrostomy tube) procedure 05/25/2017  . Sleep apnea   . THYROID CANCER 10/13/2010   Qualifier:  Diagnosis of  By: Nils Pyle CMA ([place]), Mearl Latin    . Thyroid cancer [facility])    s/p thyroidectomy  . Type II diabetes mellitus ([place])    type II     Family History  Problem Relation Age of Onset  . Prostate cancer Father   . Diabetes Sister   . Heart disease Sister   . Heart attack Brother   . Heart disease Brother   . Diabetes Brother   . Diabetes Sister   . Diabetes Sister   . Pancreatic cancer Sister   . Diabetes Brother   . Heart disease Brother   . Diabetes Brother   . Heart disease Brother   . Diabetes Brother   . Heart disease Brother   . Diabetes Brother   . Heart disease Brother   . Diabetes Brother   . Heart disease Brother   . Diabetes Brother   . Diabetes Child     Past Surgical History:  Procedure Laterality Date  . ABDOMINAL HYSTERECTOMY     "partial"  . BREAST BIOPSY Right   . BREAST LUMPECTOMY Right   . CARDIOVERSION N/A 03/30/2014   Procedure: CARDIOVERSION - BEDSIDE;  Surgeon: [name], MD;  Location: [place];  Service: Cardiovascular;  Laterality: N/A;  . CARDIOVERSION N/A 03/31/2014   Procedure: CARDIOVERSION  (BEDSIDE) ;  Surgeon: Lelon Perla, MD;  Location: [place];  Service: Cardiovascular;  Laterality: N/A;  . CARDIOVERSION N/A 04/27/2014   Procedure: CARDIOVERSION;  Surgeon: Darlin Coco, MD;  Location: [place];  Service: Cardiovascular;  Laterality: N/A;  . CARDIOVERSION N/A 04/13/2015   Procedure: CARDIOVERSION;  Surgeon: Thayer Headings, MD;  Location: [place];  Service: Cardiovascular;  Laterality: N/A;  . CARDIOVERSION N/A 01/08/2017   Procedure: CARDIOVERSION;  Surgeon: Sanda Klein, MD;  Location: [facility] ENDOSCOPY;  Service: Cardiovascular;  Laterality: N/A;  . CARDIOVERSION N/A 09/19/2017   Procedure: CARDIOVERSION;  Surgeon: [name], MD;  Location: [facility] ENDOSCOPY;  Service: Cardiovascular;  Laterality: N/A;  . CARDIOVERSION N/A 10/25/2017   Procedure: CARDIOVERSION;  Surgeon: Thayer Headings, MD;  Location: [facility] ENDOSCOPY;   Service: Cardiovascular;  Laterality: N/A;  . CARDIOVERSION N/A 12/27/2018   Procedure: CARDIOVERSION;  Surgeon: [name], MD;  Location: [facility] ENDOSCOPY;  Service: Cardiovascular;  Laterality: N/A;  . CARPAL TUNNEL RELEASE Right   . CATARACT EXTRACTION W/ INTRAOCULAR LENS  IMPLANT, BILATERAL Bilateral   . COLONOSCOPY    . EXCISIONAL  HEMORRHOIDECTOMY    . INSERTION OF MESH N/A 05/25/2017   Procedure: INSERTION OF MESH;  Surgeon: Ralene Ok, MD;  Location: [facility];  Service: General;  Laterality: N/A;  . TEE WITHOUT CARDIOVERSION N/A 03/30/2014   Procedure: TRANSESOPHAGEAL ECHOCARDIOGRAM (TEE);  Surgeon: [name], MD;  Location: [facility] ENDOSCOPY;  Service: Cardiovascular;  Laterality: N/A;  . THYROIDECTOMY    . TONSILLECTOMY     Social History   Occupational History  . Occupation: Retired    Comment: Education officer, museum  Tobacco Use  . Smoking status: Never Smoker  . Smokeless tobacco: Never Used  Substance and Sexual Activity  . Alcohol use: No    Alcohol/week: 0.0 standard drinks  . Drug use: No  . Sexual activity: Never

## 2019-03-28 ENCOUNTER — Telehealth: Payer: Self-pay

## 2019-03-28 NOTE — Telephone Encounter (Signed)
Scheduled patient for e-visit with [name] 5/20. Phone call be need to be completed as the patient does not have a smart phone.  Consent obtained.     Virtual Visit Pre-Appointment Phone Call  Confirm consent - "In the setting of the current Covid19 crisis, you are scheduled for a (phone or video) visit with your provider on (date) at (time).  Just as we do with many in-office visits, in order for you to participate in this visit, we must obtain consent.  If you'd like, I can send this to your mychart (if signed up) or email for you to review.  Otherwise, I can obtain your verbal consent now.  All virtual visits are billed to your insurance company just like a normal visit would be.  By agreeing to a virtual visit, we'd like you to understand that the technology does not allow for your provider to perform an examination, and thus may limit your provider's ability to fully assess your condition. If your provider identifies any concerns that need to be evaluated in person, we will make arrangements to do so.  Finally, though the technology is pretty good, we cannot assure that it will always work on either your or our end, and in the setting of a video visit, we may have to convert it to a phone-only visit.  In either situation, we cannot ensure that we have a secure connection.  Are you willing to proceed?" STAFF: Did the patient verbally acknowledge consent to telehealth visit? Document YES/NO here: YES  TELEPHONE CALL NOTE  [name] has been deemed a candidate for a follow-up tele-health visit to limit community exposure during the Covid-19 pandemic. I spoke with the patient via phone to ensure availability of phone/video source, confirm preferred email & phone number, and discuss instructions and expectations.  I reminded [name] to be prepared with any vital sign and/or heart rhythm information that could potentially be obtained via home monitoring, at the time of her visit. I  reminded [name] to expect a phone call prior to her e-visit.

## 2019-04-02 ENCOUNTER — Encounter: Payer: Self-pay | Admitting: Cardiovascular Disease

## 2019-04-02 ENCOUNTER — Telehealth (INDEPENDENT_AMBULATORY_CARE_PROVIDER_SITE_OTHER): Payer: Medicare Other | Admitting: Cardiovascular Disease

## 2019-04-02 ENCOUNTER — Other Ambulatory Visit: Payer: Self-pay

## 2019-04-02 VITALS — BP 127/78 | HR 61 | Ht 66.0 in | Wt 183.0 lb

## 2019-04-02 DIAGNOSIS — I4819 Other persistent atrial fibrillation: Secondary | ICD-10-CM | POA: Diagnosis not present

## 2019-04-02 DIAGNOSIS — I10 Essential (primary) hypertension: Secondary | ICD-10-CM

## 2019-04-02 DIAGNOSIS — I5032 Chronic diastolic (congestive) heart failure: Secondary | ICD-10-CM

## 2019-04-02 MED ORDER — LOSARTAN POTASSIUM 50 MG PO TABS: 50.0000 mg | ORAL_TABLET | Freq: Every day | ORAL | 3 refills | Status: DC

## 2019-04-02 MED ORDER — LOSARTAN POTASSIUM 25 MG PO TABS: 25.0000 mg | ORAL_TABLET | Freq: Every day | ORAL | 3 refills | Status: DC

## 2019-04-02 NOTE — Progress Notes (Signed)
Virtual Visit via Telephone Note   This visit type was conducted due to national recommendations for restrictions regarding the COVID-19 Pandemic (e.g. social distancing) in an effort to limit this patient's exposure and mitigate transmission in our community.  Due to her co-morbid illnesses, this patient is at least at moderate risk for complications without adequate follow up.  This format is felt to be most appropriate for this patient at this time.  The patient did not have access to video technology/had technical difficulties with video requiring transitioning to audio format only (telephone).  All issues noted in this document were discussed and addressed.  No physical exam could be performed with this format.  Please refer to the patient's chart for her  consent to telehealth for [facility].   Date:  04/02/2019   ID:  [name], DOB September 15, 1931, MRN [id number]  Patient Location: Home Provider Location: Home  PCP:  Lavone Orn, MD  Cardiologist:  Sherren Mocha, MD  Electrophysiologist:  None   Evaluation Performed:  Follow-Up Visit  Chief Complaint:  Atrial fibrillation follow-up  History of Present Illness:    [name] is a 85 y.o. female with hx of atrial fibrillation, presenting for follow-up evaluation via telephone today, in light of the Covid 19 pandemic.   The patient does not have symptoms concerning for COVID-19 infection (fever, chills, cough, or new shortness of breath).   The patient has had longstanding issues with persistent atrial fibrillation.  She has had multiple cardioversions and has been treated with antiarrhythmic drug therapy.  She is been followed closely in the atrial for but clinic over the past year and really has done quite well on oral amiodarone 200 mg daily.  She was last cardioverted in February of this year.  She continues on amiodarone and low-dose metoprolol.  She has been consistently anticoagulated with apixaban.  She fell  and injured her knee recently. This is now improving and she had a 'cortisone shot' for treatment. She recently had some medication adjustments and reports improvement in her slow heart rate has improved. However, since she started losartan she is concerned about low blood pressures.  She has not had any recent issues with shortness of breath, leg swelling, orthopnea, or PND.  She is had no heart palpitations since her cardioversion.  She did have some chest pain recently and she attributes this to stress during the COVID-19 pandemic.  No exertional chest pain or pressure.  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   Allergy    SEASONAL   Anemia    years ago   ANEMIA, IRON DEFICIENCY 10/14/2010   Qualifier: Diagnosis of  By: Sharlett Iles MD Cline Cools R    Anemia, unspecified 10/13/2010   Qualifier: Diagnosis of  By: Nils Pyle CMA (AAMA), Leisha     Anticoagulated by anticoagulation treatment - Eliquis 03/30/2014   Started May 2015    Anxiety state 10/13/2010   Qualifier: Diagnosis of  By: Nils Pyle CMA (AAMA), Leisha     Arthritis    "all my joints" (03/31/2014)   Atrial fibrillation ([place])    Atrial fibrillation with RVR ([place]) 03/31/2015   DLCO dropped from 100% in 2015-50% in 01/2017   Barrett's esophagus    Breast cancer [facility])    s/p right breast lumpectomy   CARDIAC MURMUR  10/13/2010   Qualifier: Diagnosis of  By: Nils Pyle CMA (AAMA), Leisha     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   Cervical cancer ([place])    Chronic cough 01/02/2017   GERD, hiatal hernia Doubt amio   COLONIC POLYPS, ADENOMATOUS, HX OF 10/13/2010   Qualifier: Diagnosis of  By: Nils Pyle CMA ([place]), Leisha     Degenerative disorder of eye 07/25/2012   Diabetes mellitus without complication ([place])    Dilated cardiomyopathy ([place]) 08/27/2018    DIVERTICULOSIS, COLON 10/13/2010   Qualifier: Diagnosis of  By: Nils Pyle CMA (AAMA), Leisha     DJD (degenerative joint disease) of knee    Dysrhythmia    Afib   EROSIVE ESOPHAGITIS 10/13/2010   Qualifier: Diagnosis of  By: Nils Pyle CMA (AAMA), Leisha     Error, refractive, myopia 07/09/2012   Essential hypertension 10/13/2010   Qualifier: Diagnosis of  By: Nils Pyle CMA (AAMA), Rueben Bash, intermittent 07/09/2012   GERD 10/14/2010   Qualifier: Diagnosis of  By: Sharlett Iles MD Cline Cools R    GERD (gastroesophageal reflux disease)    Glaucoma    H/O hiatal hernia    Heart murmur    Hiatal hernia s/p robotic repair & fundoplication 4/78/2956 21/01/864   Qualifier: Diagnosis of  By: Nils Pyle CMA (AAMA), Mearl Latin     History of laser assisted in situ keratomileusis 07/25/2012   Hx of transesophageal echocardiography (TEE) for monitoring    TEE (03/2014): No LAA clot, moderate LAE, core triatriatum type structure in RA with no stenosis, normal EF 55-60%, mild MR   HYPERCHOLESTEROLEMIA 10/13/2010   Qualifier: Diagnosis of  By: Nils Pyle CMA (AAMA), Leisha     Hypertension    Hypothyroidism, postsurgical    Irritable bowel syndrome 10/13/2010   Qualifier: Diagnosis of  By: Nils Pyle CMA (AAMA), Leisha     Lumbar stenosis with neurogenic claudication 04/20/2016   Malignant neoplasm of breast ([place]) 02/25/2016   Migraine    "last one was years ago" (03/31/2014)   MIGRAINE HEADACHE 10/13/2010   Qualifier: Diagnosis of  By: Nils Pyle CMA (AAMA), Leisha     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   Overview:  X(T)    OSA on CPAP    settings at 10-11    OSTEOARTHRITIS 10/13/2010   Qualifier: Diagnosis of  By: Nils Pyle CMA (AAMA), Leisha     Post-surgical hypothyroidism 04/09/2014   RECTAL FISSURE 10/13/2010   Qualifier: Diagnosis of  By: Nils Pyle CMA (AAMA), Mearl Latin     S/P Nissen fundoplication (without gastrostomy tube) procedure 05/25/2017   Sleep apnea    THYROID CANCER 10/13/2010   Qualifier:  Diagnosis of  By: Nils Pyle CMA Deborra Medina), Mearl Latin     Thyroid cancer [facility])    s/p thyroidectomy   Type II diabetes mellitus ([place])    type II    Past Surgical History:  Procedure Laterality Date   ABDOMINAL HYSTERECTOMY     "partial"   BREAST BIOPSY Right    BREAST LUMPECTOMY Right    CARDIOVERSION N/A 03/30/2014   Procedure: CARDIOVERSION - BEDSIDE;  Surgeon: [name], MD;  Location: [place];  Service: Cardiovascular;  Laterality: N/A;   CARDIOVERSION N/A 03/31/2014   Procedure: CARDIOVERSION  (BEDSIDE) ;  Surgeon: Lelon Perla, MD;  Location: [place];  Service: Cardiovascular;  Laterality: N/A;   CARDIOVERSION N/A 04/27/2014   Procedure: CARDIOVERSION;  Surgeon: Darlin Coco, MD;  Location: [place];  Service: Cardiovascular;  Laterality: N/A;   CARDIOVERSION N/A 04/13/2015   Procedure: CARDIOVERSION;  Surgeon: Wonda Cheng  Nahser, MD;  Location: [place];  Service: Cardiovascular;  Laterality: N/A;   CARDIOVERSION N/A 01/08/2017   Procedure: CARDIOVERSION;  Surgeon: Sanda Klein, MD;  Location: [place];  Service: Cardiovascular;  Laterality: N/A;   CARDIOVERSION N/A 09/19/2017   Procedure: CARDIOVERSION;  Surgeon: [name], MD;  Location: [place];  Service: Cardiovascular;  Laterality: N/A;   CARDIOVERSION N/A 10/25/2017   Procedure: CARDIOVERSION;  Surgeon: Acie Fredrickson Wonda Cheng, MD;  Location: [facility] END[facility]COPY;  Service: Cardiovascular;  Laterality: N/A;   CARDIOVERSION N/A 12/27/2018   Procedure: CARDIOVERSION;  Surgeon: [name], MD;  Location: [facility] ENDOSCOPY;  Service: Cardiovascular;  Laterality: N/A;   CARPAL TUNNEL RELEASE Right    CATARACT EXTRACTION W/ INTRAOCULAR LENS  IMPLANT, BILATERAL Bilateral    COLONOSCOPY     EXCISIONAL HEMORRHOIDECTOMY     INSERTION OF MESH N/A 05/25/2017   Procedure: INSERTION OF MESH;  Surgeon: Ralene Ok, MD;  Location: [facility];  Service: General;  Laterality: N/A;   TEE WITHOUT CARDIOVERSION N/A  03/30/2014   Procedure: TRANSESOPHAGEAL ECHOCARDIOGRAM (TEE);  Surgeon: [name], MD;  Location: [facility] ENDOSCOPY;  Service: Cardiovascular;  Laterality: N/A;   THYROIDECTOMY     TONSILLECTOMY       Current Meds  Medication Sig   acetaminophen (TYLENOL) 650 MG CR tablet Take 650 mg by mouth every 8 (eight) hours as needed for pain.    amiodarone (PACERONE) 200 MG tablet TAKE 1 TABLET BY MOUTH  DAILY   cetirizine (ZYRTEC) 10 MG tablet Take 10 mg by mouth daily as needed for allergies.   ELIQUIS 5 MG TABS tablet TAKE 1 TABLET BY MOUTH TWO  TIMES DAILY   ferrous sulfate 325 (65 FE) MG tablet Take 325 mg by mouth daily with breakfast.   fluticasone (FLONASE) 50 MCG/ACT nasal spray Place 1 spray into both nostrils at bedtime.    hydrochlorothiazide (HYDRODIURIL) 25 MG tablet Take 25 mg by mouth as needed.   lansoprazole (PREVACID) 30 MG capsule Take 30 mg by mouth daily.    levothyroxine (SYNTHROID, LEVOTHROID) 175 MCG tablet Take 175 mcg by mouth daily before breakfast.   LORazepam (ATIVAN) 1 MG tablet Take 1.5 mg by mouth See admin instructions. Take 1.5 mg by mouth at night, may take 0.5 mg during the day as needed for anxiety   losartan (COZAAR) 50 MG tablet Take 1 tablet (50 mg total) by mouth daily.   Lutein 6 MG CAPS Take 6 mg by mouth 3 (three) times a week.    meclizine (ANTIVERT) 12.5 MG tablet Take 12.5 mg by mouth daily as needed for dizziness.    metFORMIN (GLUCOPHAGE-XR) 500 MG 24 hr tablet Take 250-500 mg by mouth 2 (two) times daily.   metoprolol tartrate (LOPRESSOR) 25 MG tablet TAKE ONE-HALF TABLET BY  MOUTH TWO TIMES DAILY   Multiple Vitamins-Minerals (ICAPS AREDS 2) CAPS Take 1 capsule by mouth daily.    Polyvinyl Alcohol-Povidone PF (REFRESH) 1.4-0.6 % SOLN Place 1 drop into both eyes daily as needed (dry eyes).    potassium chloride (KLOR-CON) 20 MEQ packet Take 20 mEq by mouth daily.    rosuvastatin (CRESTOR) 10 MG tablet Take 10 mg by mouth at  bedtime.   sertraline (ZOLOFT) 50 MG tablet Take 50 mg by mouth daily.   vitamin B-12 (CYANOCOBALAMIN) 500 MCG tablet Take 500 mcg by mouth daily as needed (energy).   [DISCONTINUED] losartan (COZAAR) 50 MG tablet Take 1 tablet (50 mg total) by mouth daily.  Allergies:   Penicillins   Social History   Tobacco Use   Smoking status: Never Smoker   Smokeless tobacco: Never Used  Substance Use Topics   Alcohol use: No    Alcohol/week: 0.0 standard drinks   Drug use: No     Family Hx: The patient's family history includes Diabetes in her brother, brother, brother, brother, brother, brother, brother, child, sister, sister, and sister; Heart attack in her brother; Heart disease in her brother, brother, brother, brother, brother, brother, and sister; Pancreatic cancer in her sister; Prostate cancer in her father.  ROS:   Please see the history of present illness.    All other systems reviewed and are negative.   Prior CV studies:   The following studies were reviewed today:  2D echocardiogram 04/24/2018: Study Conclusions  - Left ventricle: The cavity size was normal. There was moderate   concentric hypertrophy. Systolic function was vigorous. The   estimated ejection fraction was in the range of 65% to 70%. Wall   motion was normal; there were no regional wall motion   abnormalities. Features are consistent with a pseudonormal left   ventricular filling pattern, with concomitant abnormal relaxation   and increased filling pressure (grade 2 diastolic dysfunction). - Aortic valve: There was mild regurgitation. - Mitral valve: Calcified annulus. Mildly thickened leaflets .   There was mild regurgitation. - Left atrium: The atrium was moderately dilated. - Right atrium: The atrium was mildly dilated. - Tricuspid valve: There was mild regurgitation. - Pulmonary arteries: Systolic pressure was mildly to moderately   increased. PA peak pressure: 40 mm Hg (S). - Inferior  vena cava: The vessel was normal in size. The   respirophasic diameter changes were in the normal range (= 50%),   consistent with normal central venous pressure. - Pericardium, extracardiac: There was no pericardial effusion.  Impressions:  - When compared to the rpior study from 12/11/2016 biventricular   systolic function has improved and is now normal.  Labs/Other Tests and Data Reviewed:    EKG:  An ECG dated 01/06/2019 was personally reviewed today and demonstrated:  sinus brady 53 bpm, age indeterminate inferior and anterior infarcts, nonspecific ST change  Recent Labs: 04/22/2018: ALT 11 12/12/2018: BUN 18; Creatinine, Ser 0.97; Platelets 170; TSH 1.045 12/27/2018: Hemoglobin 13.3; Potassium 3.7; Sodium 139   Recent Lipid Panel No results found for: CHOL, TRIG, HDL, CHOLHDL, LDLCALC, LDLDIRECT  Wt Readings from Last 3 Encounters:  04/02/19 183 lb (83 kg)  01/06/19 192 lb (87.1 kg)  12/27/18 189 lb (85.7 kg)     Objective:    Vital Signs:  BP 127/78    Pulse 61    Ht 5\' 6"  (1.676 m)    Wt 183 lb (83 kg)    LMP  (LMP Unknown)    BMI 29.54 kg/m    VITAL SIGNS:  reviewed Alert, oriented woman in no distress.  Breathing comfortably during conversation.  Remaining portions of exam deferred as this is a virtual/telephone visit.  ASSESSMENT & PLAN:    1. Persistent atrial fibrillation: The patient is maintaining sinus rhythm now on amiodarone.  She continues on metoprolol.  Heart rate has improved since discontinuation of diltiazem.  She continues on anticoagulation with apixaban.  I have recommended updated labs with a complete metabolic panel and TSH/free T4 to monitor chronic use of amiodarone.  No medication changes are made regarding her atrial fibrillation today. 2. Hypertension: Possibly overtreated.  The patient systolic blood pressures frequently running 100  to 110 mmHg and she has some complaints of weakness and fatigue.  I have recommended that she reduce losartan to 25  mg daily.  We will touch base with her in a few weeks to see how her blood pressure is running.  If remaining low, would probably discontinue losartan. 3. Chronic diastolic heart failure: Patient with a history of LV systolic dysfunction, but this normalized with restoration of normal sinus rhythm based on her last echo result.  She does not appear to have any signs or symptoms of congestive heart failure at present.  Will continue current therapy.  COVID-19 Education: The signs and symptoms of COVID-19 were discussed with the patient and how to seek care for testing (follow up with PCP or arrange E-visit).  The importance of social distancing was discussed today.  Time:   Today, I have spent 18 minutes with the patient with telehealth technology discussing the above problems.     Medication Adjustments/Labs and Tests Ordered: Current medicines are reviewed at length with the patient today.  Concerns regarding medicines are outlined above.   Tests Ordered: No orders of the defined types were placed in this encounter.   Medication Changes: Meds ordered this encounter  Medications   losartan (COZAAR) 50 MG tablet    Sig: Take 1 tablet (50 mg total) by mouth daily.    Dispense:  90 tablet    Refill:  3    Disposition:  Follow up in 6 month(s) Richardson Dopp  Signed, Sherren Mocha, MD  04/02/2019 10:07 AM    [place]

## 2019-04-02 NOTE — Addendum Note (Signed)
Addended by: Harland German A on: 04/02/2019 11:27 AM   Modules accepted: Orders

## 2019-04-02 NOTE — Patient Instructions (Signed)
Medication Instructions:  1) DECREASE LOSARTAN to 25 mg daily.   Labwork: You have an appointment for lab work on July 1. You do not need to be fasting.  Follow-Up: You have an appointment scheduled with Richardson Dopp on 09/30/2019.

## 2019-04-21 ENCOUNTER — Ambulatory Visit: Payer: Medicare Other | Admitting: Neurology

## 2019-05-09 ENCOUNTER — Telehealth: Payer: Self-pay | Admitting: Cardiovascular Disease

## 2019-05-09 NOTE — Telephone Encounter (Signed)
Spoke with the patient, she has worsening SOB and dizziness. She believes she is in afib. Her current HR: 118. Spoke with Dr. Rayann Heman he stated to increase amiodarone to 200 mg, BID for the weekend and schedule an appointment with afib clinic. If her symptoms worsen she was advised to go to the hospital. She was scheduled with Roderic Palau, NP.   Spoke with the patient, she expressed understanding about Dr. Jackalyn Lombard recommendations.

## 2019-05-09 NOTE — Telephone Encounter (Signed)
New message   Per [name] patient  is cold and clammy and feels like in AFIB. Her respirations were 24 for a full 60 seconds. Oxygen stat 98% blood sugar is 140 Hr is 118. Please call the patient.

## 2019-05-12 ENCOUNTER — Other Ambulatory Visit: Payer: Self-pay

## 2019-05-12 ENCOUNTER — Ambulatory Visit (HOSPITAL_COMMUNITY)
Admission: RE | Admit: 2019-05-12 | Discharge: 2019-05-12 | Disposition: A | Discharge status: Home / Self Care | Payer: Medicare Other | Source: Ambulatory Visit | Attending: Nurse Practitioner | Admitting: Nurse Practitioner

## 2019-05-12 ENCOUNTER — Encounter (HOSPITAL_COMMUNITY): Payer: Self-pay | Admitting: Nurse Practitioner

## 2019-05-12 DIAGNOSIS — F419 Anxiety disorder, unspecified: Secondary | ICD-10-CM | POA: Diagnosis not present

## 2019-05-12 DIAGNOSIS — E7800 Pure hypercholesterolemia, unspecified: Secondary | ICD-10-CM | POA: Diagnosis not present

## 2019-05-12 DIAGNOSIS — M1990 Unspecified osteoarthritis, unspecified site: Secondary | ICD-10-CM | POA: Diagnosis not present

## 2019-05-12 DIAGNOSIS — K589 Irritable bowel syndrome without diarrhea: Secondary | ICD-10-CM | POA: Insufficient documentation

## 2019-05-12 DIAGNOSIS — K219 Gastro-esophageal reflux disease without esophagitis: Secondary | ICD-10-CM | POA: Diagnosis not present

## 2019-05-12 DIAGNOSIS — Z7984 Long term (current) use of oral hypoglycemic drugs: Secondary | ICD-10-CM | POA: Insufficient documentation

## 2019-05-12 DIAGNOSIS — I10 Essential (primary) hypertension: Secondary | ICD-10-CM | POA: Diagnosis not present

## 2019-05-12 DIAGNOSIS — Z79899 Other long term (current) drug therapy: Secondary | ICD-10-CM | POA: Insufficient documentation

## 2019-05-12 DIAGNOSIS — K2270 Barrett's esophagus without dysplasia: Secondary | ICD-10-CM | POA: Insufficient documentation

## 2019-05-12 DIAGNOSIS — K449 Diaphragmatic hernia without obstruction or gangrene: Secondary | ICD-10-CM | POA: Diagnosis not present

## 2019-05-12 DIAGNOSIS — M48061 Spinal stenosis, lumbar region without neurogenic claudication: Secondary | ICD-10-CM | POA: Diagnosis not present

## 2019-05-12 DIAGNOSIS — G4733 Obstructive sleep apnea (adult) (pediatric): Secondary | ICD-10-CM | POA: Diagnosis not present

## 2019-05-12 DIAGNOSIS — Z7901 Long term (current) use of anticoagulants: Secondary | ICD-10-CM | POA: Diagnosis not present

## 2019-05-12 DIAGNOSIS — I420 Dilated cardiomyopathy: Secondary | ICD-10-CM | POA: Diagnosis not present

## 2019-05-12 DIAGNOSIS — D509 Iron deficiency anemia, unspecified: Secondary | ICD-10-CM | POA: Diagnosis not present

## 2019-05-12 DIAGNOSIS — E119 Type 2 diabetes mellitus without complications: Secondary | ICD-10-CM | POA: Diagnosis not present

## 2019-05-12 DIAGNOSIS — I4819 Other persistent atrial fibrillation: Secondary | ICD-10-CM | POA: Insufficient documentation

## 2019-05-12 LAB — COMPREHENSIVE METABOLIC PANEL
ALT: 17 U/L (ref 0–44)
AST: 18 U/L (ref 15–41)
Albumin: 3.5 g/dL (ref 3.5–5.0)
Alkaline Phosphatase: 68 U/L (ref 38–126)
Anion gap: 6 (ref 5–15)
BUN: 21 mg/dL (ref 8–23)
CO2: 25 mmol/L (ref 22–32)
Calcium: 9 mg/dL (ref 8.9–10.3)
Chloride: 110 mmol/L (ref 98–111)
Creatinine, Ser: 0.94 mg/dL (ref 0.44–1.00)
GFR calc Af Amer: >60 mL/min (ref >60)
GFR calc non Af Amer: 55 mL/min — ABNORMAL LOW (ref >60)
Glucose, Bld: 150 mg/dL — ABNORMAL HIGH (ref 70–99)
Potassium: 4.1 mmol/L (ref 3.5–5.1)
Sodium: 141 mmol/L (ref 135–145)
Total Bilirubin: 0.6 mg/dL (ref 0.3–1.2)
Total Protein: 5.8 g/dL — ABNORMAL LOW (ref 6.5–8.1)

## 2019-05-12 LAB — CBC
HCT: 41.2 % (ref 36.0–46.0)
Hemoglobin: 13.6 g/dL (ref 12.0–15.0)
MCH: 32.2 pg (ref 26.0–34.0)
MCHC: 33 g/dL (ref 30.0–36.0)
MCV: 97.4 fL (ref 80.0–100.0)
Platelets: 157 10*3/uL (ref 150–400)
RBC: 4.23 MIL/uL (ref 3.87–5.11)
RDW: 12.6 % (ref 11.5–15.5)
WBC: 4.4 10*3/uL (ref 4.0–10.5)
nRBC: 0 % (ref 0.0–0.2)

## 2019-05-12 LAB — T4, FREE: Free T4: 2.16 ng/dL — ABNORMAL HIGH (ref 0.61–1.12)

## 2019-05-12 LAB — TSH: TSH: 0.205 u[IU]/mL — ABNORMAL LOW (ref 0.350–4.500)

## 2019-05-12 NOTE — Patient Instructions (Signed)
Cardioversion scheduled for Tuesday, July 7th  - Arrive at the [facility] and go to admitting at 12PM  -Do not eat or drink anything after midnight the night prior to your  procedure.  - Take all your morning medication with a sip of water prior to arrival.  - You will not be able to drive home after your procedure.   Continue amiodarone at 200mg  twice a day until follow up  Covid testing scheduled for Friday.

## 2019-05-12 NOTE — Progress Notes (Addendum)
Primary Care Physician: Lavone Orn, MD Cardiologist: [name] Pulmonologist: Dr. Elsworth Soho GI: Dr. Dolphus Jenny [name] is a 85 y.o. female with a h/o afib that has been on amiodarone since June of 2015. She has had many breakthrough events, last one in February with successful cardioversion. She does feel very weak in afib. She went into afib around 2 weeks ago and a RN came out to her house on Friday and documented the afib.  Dr. Rayann Heman was called and left instructions to increase amio to 200 mg bid and stop losartan. Apparently her BP was low on that day.  She is in now in the afib clinc and she is rate controlled and BP is stable. She reports around 2-3 weeks ago she first had 2 teeth pilled and then a shot of cortisone in her knee. She hasn't felt well since. She usually requires a cardioversion to restore SR.    Today, she denies symptoms of chest pain, shortness of breath, orthopnea, PND, lower extremity edema, dizziness, presyncope, syncope, or neurologic sequela.Perisistent cough. The patient is tolerating medications without difficulties and is otherwise without complaint today.   Past Medical History:  Diagnosis Date  . Acute bronchitis 11/15/2017  . ADENOCARCINOMA, BREAST 10/13/2010   Qualifier: Diagnosis of  By: Nils Pyle CMA (AAMA), Mearl Latin    . 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 Allergy    SEASONAL  . Anemia    years ago  . ANEMIA, IRON DEFICIENCY 10/14/2010   Qualifier: Diagnosis of  By: Sharlett Iles MD Byrd Hesselbach   . Anemia, unspecified 10/13/2010   Qualifier: Diagnosis of  By: Nils Pyle CMA ([place]), Mearl Latin    . Anticoagulated by anticoagulation treatment - Eliquis 03/30/2014   Started May 2015   . Anxiety state 10/13/2010   Qualifier: Diagnosis of  By: Nils Pyle CMA ([place]), Mearl Latin    . Arthritis    "all my joints" (03/31/2014)  . Atrial fibrillation ([place])   . Atrial fibrillation with RVR ([place]) 03/31/2015   DLCO dropped from 100% in 2015-50% in 01/2017  .  Barrett's esophagus   . Breast cancer [facility])    s/p right breast lumpectomy  . CARDIAC MURMUR 10/13/2010   Qualifier: Diagnosis of  By: Nils Pyle CMA ([place]), Mearl Latin    . 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 Cervical cancer ([place])   . Chronic cough 01/02/2017   GERD, hiatal hernia Doubt amio  . COLONIC POLYPS, ADENOMATOUS, HX OF 10/13/2010   Qualifier: Diagnosis of  By: Nils Pyle CMA (AAMA), Mearl Latin    . Degenerative disorder of eye 07/25/2012  . Diabetes mellitus without complication ([place])   . Dilated cardiomyopathy ([place]) 08/27/2018  . DIVERTICULOSIS, COLON 10/13/2010   Qualifier: Diagnosis of  By: Nils Pyle CMA (AAMA), Mearl Latin    . DJD (degenerative joint disease) of knee   . Dysrhythmia    Afib  . EROSIVE ESOPHAGITIS 10/13/2010   Qualifier: Diagnosis of  By: Nils Pyle CMA ([place]), Mearl Latin    . Error, refractive, myopia 07/09/2012  . Essential hypertension 10/13/2010   Qualifier: Diagnosis of  By: Nils Pyle CMA ([place]), Mearl Latin    . Exotropia, intermittent 07/09/2012  . GERD 10/14/2010   Qualifier: Diagnosis of  By: Sharlett Iles MD Byrd Hesselbach GERD (gastroesophageal reflux disease)   . Glaucoma   . H/O hiatal hernia   . Heart murmur   . Hiatal hernia s/p robotic repair & fundoplication 0/16/0109 32/01/5572   Qualifier: Diagnosis of  By: Nils Pyle CMA (AAMA), Mearl Latin    .  History of laser assisted in situ keratomileusis 07/25/2012  . Hx of transesophageal echocardiography (TEE) for monitoring    TEE (03/2014): No LAA clot, moderate LAE, core triatriatum type structure in RA with no stenosis, normal EF 55-60%, mild MR  . HYPERCHOLESTEROLEMIA 10/13/2010   Qualifier: Diagnosis of  By: Nils Pyle CMA ([place]), Mearl Latin    . Hypertension   . Hypothyroidism, postsurgical   . Irritable bowel syndrome 10/13/2010   Qualifier: Diagnosis of  By: Nils Pyle CMA (AAMA), Mearl Latin    . Lumbar stenosis with neurogenic claudication 04/20/2016  . Malignant neoplasm of breast ([place]) 02/25/2016  . Migraine    "last one was years ago" (03/31/2014)   . MIGRAINE HEADACHE 10/13/2010   Qualifier: Diagnosis of  By: Nils Pyle CMA ([place]), Mearl Latin    . 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 OSA on CPAP    settings at 10-11   . OSTEOARTHRITIS 10/13/2010   Qualifier: Diagnosis of  By: Nils Pyle CMA ([place]), Mearl Latin    . Post-surgical hypothyroidism 04/09/2014  . RECTAL FISSURE 10/13/2010   Qualifier: Diagnosis of  By: Nils Pyle CMA ([place]), Mearl Latin    . S/P Nissen fundoplication (without gastrostomy tube) procedure 05/25/2017  . Sleep apnea   . THYROID CANCER 10/13/2010   Qualifier: Diagnosis of  By: Nils Pyle CMA ([place]), Mearl Latin    . Thyroid cancer [facility])    s/p thyroidectomy  . Type II diabetes mellitus ([place])    type II    Past Surgical History:  Procedure Laterality Date  . ABDOMINAL HYSTERECTOMY     "partial"  . BREAST BIOPSY Right   . BREAST LUMPECTOMY Right   . CARDIOVERSION N/A 03/30/2014   Procedure: CARDIOVERSION - BEDSIDE;  Surgeon: [name], MD;  Location: [place];  Service: Cardiovascular;  Laterality: N/A;  . CARDIOVERSION N/A 03/31/2014   Procedure: CARDIOVERSION  (BEDSIDE) ;  Surgeon: Lelon Perla, MD;  Location: [place];  Service: Cardiovascular;  Laterality: N/A;  . CARDIOVERSION N/A 04/27/2014   Procedure: CARDIOVERSION;  Surgeon: Darlin Coco, MD;  Location: [place];  Service: Cardiovascular;  Laterality: N/A;  . CARDIOVERSION N/A 04/13/2015   Procedure: CARDIOVERSION;  Surgeon: Thayer Headings, MD;  Location: [place];  Service: Cardiovascular;  Laterality: N/A;  . CARDIOVERSION N/A 01/08/2017   Procedure: CARDIOVERSION;  Surgeon: Sanda Klein, MD;  Location: [facility] ENDOSCOPY;  Service: Cardiovascular;  Laterality: N/A;  . CARDIOVERSION N/A 09/19/2017   Procedure: CARDIOVERSION;  Surgeon: [name], MD;  Location: [facility] ENDOSCOPY;  Service: Cardiovascular;  Laterality: N/A;  . CARDIOVERSION N/A 10/25/2017   Procedure: CARDIOVERSION;  Surgeon: Thayer Headings, MD;  Location: [facility] ENDOSCOPY;  Service:  Cardiovascular;  Laterality: N/A;  . CARDIOVERSION N/A 12/27/2018   Procedure: CARDIOVERSION;  Surgeon: [name], MD;  Location: [facility] ENDOSCOPY;  Service: Cardiovascular;  Laterality: N/A;  . CARPAL TUNNEL RELEASE Right   . CATARACT EXTRACTION W/ INTRAOCULAR LENS  IMPLANT, BILATERAL Bilateral   . COLONOSCOPY    . EXCISIONAL HEMORRHOIDECTOMY    . INSERTION OF MESH N/A 05/25/2017   Procedure: INSERTION OF MESH;  Surgeon: Ralene Ok, MD;  Location: [facility];  Service: General;  Laterality: N/A;  . TEE WITHOUT CARDIOVERSION N/A 03/30/2014   Procedure: TRANSESOPHAGEAL ECHOCARDIOGRAM (TEE);  Surgeon: [name], MD;  Location: [facility] ENDOSCOPY;  Service: Cardiovascular;  Laterality: N/A;  . THYROIDECTOMY    . TONSILLECTOMY      Current Outpatient Medications  Medication Sig Dispense Refill  . acetaminophen (TYLENOL) 650 MG  CR tablet Take 650 mg by mouth every 8 (eight) hours as needed for pain.     Marland Kitchen amiodarone (PACERONE) 200 MG tablet TAKE 1 TABLET BY MOUTH  DAILY (Patient taking differently: Take 200 mg by mouth 2 (two) times daily. ) 90 tablet 3  . cetirizine (ZYRTEC) 10 MG tablet Take 10 mg by mouth daily as needed for allergies.    Marland Kitchen ELIQUIS 5 MG TABS tablet TAKE 1 TABLET BY MOUTH TWO  TIMES DAILY 180 tablet 3  . ferrous sulfate 325 (65 FE) MG tablet Take 325 mg by mouth daily with breakfast.    . fluticasone (FLONASE) 50 MCG/ACT nasal spray Place 1 spray into both nostrils at bedtime.     . lansoprazole (PREVACID) 30 MG capsule Take 30 mg by mouth daily.     Marland Kitchen levothyroxine (SYNTHROID, LEVOTHROID) 175 MCG tablet Take 175 mcg by mouth daily before breakfast.    . LORazepam (ATIVAN) 1 MG tablet Take 1.5 mg by mouth See admin instructions. Take 1.5 mg by mouth at night, may take 0.5 mg during the day as needed for anxiety    . Lutein 6 MG CAPS Take 6 mg by mouth 3 (three) times a week.     . metFORMIN (GLUCOPHAGE-XR) 500 MG 24 hr tablet Take 250-500 mg by mouth 2 (two) times daily.     . metoprolol tartrate (LOPRESSOR) 25 MG tablet TAKE ONE-HALF TABLET BY  MOUTH TWO TIMES DAILY (Patient taking differently: 12.5 mg. ) 90 tablet 2  . Multiple Vitamins-Minerals (ICAPS AREDS 2) CAPS Take 1 capsule by mouth daily.     . Polyvinyl Alcohol-Povidone PF (REFRESH) 1.4-0.6 % SOLN Place 1 drop into both eyes daily as needed (dry eyes).     . potassium chloride (KLOR-CON) 20 MEQ packet Take 20 mEq by mouth daily.     . rosuvastatin (CRESTOR) 10 MG tablet Take 10 mg by mouth at bedtime.    . sertraline (ZOLOFT) 50 MG tablet Take 50 mg by mouth daily.    . vitamin B-12 (CYANOCOBALAMIN) 500 MCG tablet Take 500 mcg by mouth daily as needed (energy).    . hydrochlorothiazide (HYDRODIURIL) 25 MG tablet Take 25 mg by mouth as needed.    . meclizine (ANTIVERT) 12.5 MG tablet Take 12.5 mg by mouth daily as needed for dizziness.      No current facility-administered medications for this encounter.     Allergies  Allergen Reactions  . Penicillins Itching, Rash and Other (See Comments)    Did it involve swelling of the face/tongue/throat, SOB, or low BP? No Did it involve sudden or severe rash/hives, skin peeling, or any reaction on the inside of your mouth or nose? No Did you need to seek medical attention at a hospital or doctor's office? No When did it last happen?30+ years If all above answers are "NO", may proceed with cephalosporin use.     Social History   Socioeconomic History  . Marital status: Divorced    Spouse name: Not on file  . Number of children: 2  . Years of education: Not on file  . Highest education level: Not on file  Occupational History  . Occupation: Retired    Comment: Education officer, museum  Social Needs  . Financial resource strain: Not on file  . Food insecurity    Worry: Not on file    Inability: Not on file  . Transportation needs    Medical: Not on file    Non-medical: Not on file  Tobacco Use  . Smoking status: Never Smoker  . Smokeless tobacco:  Never Used  Substance and Sexual Activity  . Alcohol use: No    Alcohol/week: 0.0 standard drinks  . Drug use: No  . Sexual activity: Never  Lifestyle  . Physical activity    Days per week: Not on file    Minutes per session: Not on file  . Stress: Not on file  Relationships  . Social Herbalist on phone: Not on file    Gets together: Not on file    Attends religious service: Not on file    Active member of club or organization: Not on file    Attends meetings of clubs or organizations: Not on file    Relationship status: Not on file  . Intimate partner violence    Fear of current or ex partner: Not on file    Emotionally abused: Not on file    Physically abused: Not on file    Forced sexual activity: Not on file  Other Topics Concern  . Not on file  Social History Narrative  . Not on file    Family History  Problem Relation Age of Onset  . Prostate cancer Father   . Diabetes Sister   . Heart disease Sister   . Heart attack Brother   . Heart disease Brother   . Diabetes Brother   . Diabetes Sister   . Diabetes Sister   . Pancreatic cancer Sister   . Diabetes Brother   . Heart disease Brother   . Diabetes Brother   . Heart disease Brother   . Diabetes Brother   . Heart disease Brother   . Diabetes Brother   . Heart disease Brother   . Diabetes Brother   . Heart disease Brother   . Diabetes Brother   . Diabetes Child     ROS- All systems are reviewed and negative except as per the HPI above  Physical Exam: Vitals:   05/12/19 1520  BP: 124/72  SpO2: 98%  Weight: 86.6 kg  Height: 5\' 6"  (1.676 m)   Wt Readings from Last 3 Encounters:  05/12/19 86.6 kg  04/02/19 83 kg  01/06/19 87.1 kg    Labs: Lab Results  Component Value Date   NA 139 12/27/2018   K 3.7 12/27/2018   CL 107 12/12/2018   CO2 25 12/12/2018   GLUCOSE 138 (H) 12/27/2018   BUN 18 12/12/2018   CREATININE 0.97 12/12/2018   CALCIUM 9.1 12/12/2018   MG 2.2 06/07/2017    Lab Results  Component Value Date   INR 1.12 04/20/2016   No results found for: CHOL, HDL, LDLCALC, TRIG   GEN- The patient is well appearing, alert and oriented x 3 today.   Head- normocephalic, atraumatic Eyes-  Sclera clear, conjunctiva pink Ears- hearing intact Oropharynx- clear Neck- supple, no JVP Lymph- no cervical lymphadenopathy Lungs- Clear to ausculation bilaterally, normal work of breathing Heart- irregular rate and rhythm, no murmurs, rubs or gallops, PMI not laterally displaced GI- soft, NT, ND, + BS Extremities- no clubbing, cyanosis, or edema MS- no significant deformity or atrophy Skin- no rash or lesion Psych- euthymic mood, full affect Neuro- strength and sensation are intact  EKG- afib at 86 bpm, qrs int 102 ms, qtc 500 ms Epic records reviewed    Assessment and Plan: 1. Persistent afib  She is currently on amiodarone 200 mg bid since Friday Will plan on cardioversion Continue metoprolol 12.5  mg bid Continue eliquis 5 mg bid for chadsvasc score of at least 5, states no missed doses She will check on the exact date that she stopped anticoagulation for her teeth extraction,  CV set up tentatively for 7/7, so would have to have been before 6/16 She will call back with that info in the am  2. HTN Stable Hold losartan for now  F/u here after one week  Addendum- 7/2, pt called back to the office and remembered she did not have to hold anticoagulation for her teeth extraction, so she will have cardioversion as scheduled 7/7.  Geroge Baseman [facility] [address] [place], [place] [address] [place] [facility], [facility] [address] [place], [place] [address] [phone]

## 2019-05-12 NOTE — H&P (View-Only) (Signed)
Primary Care Physician: Lavone Orn, MD Cardiologist: [name] Pulmonologist: Dr. Elsworth Soho GI: Dr. Dolphus Jenny [name] is a 85 y.o. female with a h/o afib that has been on amiodarone since June of 2015. She has had many breakthrough events, last one in February with successful cardioversion. She does feel very weak in afib. She went into afib around 2 weeks ago and a RN came out to her house on Friday and documented the afib.  Dr. Rayann Heman was called and left instructions to increase amio to 200 mg bid and stop losartan. Apparently her BP was low on that day.  She is in now in the afib clinc and she is rate controlled and BP is stable. She reports around 2-3 weeks ago she first had 2 teeth pilled and then a shot of cortisone in her knee. She hasn't felt well since. She usually requires a cardioversion to restore SR.    Today, she denies symptoms of chest pain, shortness of breath, orthopnea, PND, lower extremity edema, dizziness, presyncope, syncope, or neurologic sequela.Perisistent cough. The patient is tolerating medications without difficulties and is otherwise without complaint today.   Past Medical History:  Diagnosis Date  . Acute bronchitis 11/15/2017  . ADENOCARCINOMA, BREAST 10/13/2010   Qualifier: Diagnosis of  By: Nils Pyle CMA (AAMA), Mearl Latin    . 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 Allergy    SEASONAL  . Anemia    years ago  . ANEMIA, IRON DEFICIENCY 10/14/2010   Qualifier: Diagnosis of  By: Sharlett Iles MD Byrd Hesselbach   . Anemia, unspecified 10/13/2010   Qualifier: Diagnosis of  By: Nils Pyle CMA ([place]), Mearl Latin    . Anticoagulated by anticoagulation treatment - Eliquis 03/30/2014   Started May 2015   . Anxiety state 10/13/2010   Qualifier: Diagnosis of  By: Nils Pyle CMA ([place]), Mearl Latin    . Arthritis    "all my joints" (03/31/2014)  . Atrial fibrillation ([place])   . Atrial fibrillation with RVR ([place]) 03/31/2015   DLCO dropped from 100% in 2015-50% in 01/2017  .  Barrett's esophagus   . Breast cancer [facility])    s/p right breast lumpectomy  . CARDIAC MURMUR 10/13/2010   Qualifier: Diagnosis of  By: Nils Pyle CMA ([place]), Mearl Latin    . 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 Cervical cancer ([place])   . Chronic cough 01/02/2017   GERD, hiatal hernia Doubt amio  . COLONIC POLYPS, ADENOMATOUS, HX OF 10/13/2010   Qualifier: Diagnosis of  By: Nils Pyle CMA (AAMA), Mearl Latin    . Degenerative disorder of eye 07/25/2012  . Diabetes mellitus without complication ([place])   . Dilated cardiomyopathy ([place]) 08/27/2018  . DIVERTICULOSIS, COLON 10/13/2010   Qualifier: Diagnosis of  By: Nils Pyle CMA (AAMA), Mearl Latin    . DJD (degenerative joint disease) of knee   . Dysrhythmia    Afib  . EROSIVE ESOPHAGITIS 10/13/2010   Qualifier: Diagnosis of  By: Nils Pyle CMA ([place]), Mearl Latin    . Error, refractive, myopia 07/09/2012  . Essential hypertension 10/13/2010   Qualifier: Diagnosis of  By: Nils Pyle CMA ([place]), Mearl Latin    . Exotropia, intermittent 07/09/2012  . GERD 10/14/2010   Qualifier: Diagnosis of  By: Sharlett Iles MD Byrd Hesselbach GERD (gastroesophageal reflux disease)   . Glaucoma   . H/O hiatal hernia   . Heart murmur   . Hiatal hernia s/p robotic repair & fundoplication 2/95/2841 32/02/4009   Qualifier: Diagnosis of  By: Nils Pyle CMA (AAMA), Mearl Latin    .  History of laser assisted in situ keratomileusis 07/25/2012  . Hx of transesophageal echocardiography (TEE) for monitoring    TEE (03/2014): No LAA clot, moderate LAE, core triatriatum type structure in RA with no stenosis, normal EF 55-60%, mild MR  . HYPERCHOLESTEROLEMIA 10/13/2010   Qualifier: Diagnosis of  By: Nils Pyle CMA ([place]), Mearl Latin    . Hypertension   . Hypothyroidism, postsurgical   . Irritable bowel syndrome 10/13/2010   Qualifier: Diagnosis of  By: Nils Pyle CMA (AAMA), Mearl Latin    . Lumbar stenosis with neurogenic claudication 04/20/2016  . Malignant neoplasm of breast ([place]) 02/25/2016  . Migraine    "last one was years ago" (03/31/2014)   . MIGRAINE HEADACHE 10/13/2010   Qualifier: Diagnosis of  By: Nils Pyle CMA ([place]), Mearl Latin    . 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 OSA on CPAP    settings at 10-11   . OSTEOARTHRITIS 10/13/2010   Qualifier: Diagnosis of  By: Nils Pyle CMA ([place]), Mearl Latin    . Post-surgical hypothyroidism 04/09/2014  . RECTAL FISSURE 10/13/2010   Qualifier: Diagnosis of  By: Nils Pyle CMA ([place]), Mearl Latin    . S/P Nissen fundoplication (without gastrostomy tube) procedure 05/25/2017  . Sleep apnea   . THYROID CANCER 10/13/2010   Qualifier: Diagnosis of  By: Nils Pyle CMA ([place]), Mearl Latin    . Thyroid cancer [facility])    s/p thyroidectomy  . Type II diabetes mellitus ([place])    type II    Past Surgical History:  Procedure Laterality Date  . ABDOMINAL HYSTERECTOMY     "partial"  . BREAST BIOPSY Right   . BREAST LUMPECTOMY Right   . CARDIOVERSION N/A 03/30/2014   Procedure: CARDIOVERSION - BEDSIDE;  Surgeon: [name], MD;  Location: [place];  Service: Cardiovascular;  Laterality: N/A;  . CARDIOVERSION N/A 03/31/2014   Procedure: CARDIOVERSION  (BEDSIDE) ;  Surgeon: Lelon Perla, MD;  Location: [place];  Service: Cardiovascular;  Laterality: N/A;  . CARDIOVERSION N/A 04/27/2014   Procedure: CARDIOVERSION;  Surgeon: Darlin Coco, MD;  Location: [place];  Service: Cardiovascular;  Laterality: N/A;  . CARDIOVERSION N/A 04/13/2015   Procedure: CARDIOVERSION;  Surgeon: Thayer Headings, MD;  Location: [place];  Service: Cardiovascular;  Laterality: N/A;  . CARDIOVERSION N/A 01/08/2017   Procedure: CARDIOVERSION;  Surgeon: Sanda Klein, MD;  Location: [facility] ENDOSCOPY;  Service: Cardiovascular;  Laterality: N/A;  . CARDIOVERSION N/A 09/19/2017   Procedure: CARDIOVERSION;  Surgeon: [name], MD;  Location: [facility] ENDOSCOPY;  Service: Cardiovascular;  Laterality: N/A;  . CARDIOVERSION N/A 10/25/2017   Procedure: CARDIOVERSION;  Surgeon: Thayer Headings, MD;  Location: [facility] ENDOSCOPY;  Service:  Cardiovascular;  Laterality: N/A;  . CARDIOVERSION N/A 12/27/2018   Procedure: CARDIOVERSION;  Surgeon: [name], MD;  Location: [facility] ENDOSCOPY;  Service: Cardiovascular;  Laterality: N/A;  . CARPAL TUNNEL RELEASE Right   . CATARACT EXTRACTION W/ INTRAOCULAR LENS  IMPLANT, BILATERAL Bilateral   . COLONOSCOPY    . EXCISIONAL HEMORRHOIDECTOMY    . INSERTION OF MESH N/A 05/25/2017   Procedure: INSERTION OF MESH;  Surgeon: Ralene Ok, MD;  Location: [facility];  Service: General;  Laterality: N/A;  . TEE WITHOUT CARDIOVERSION N/A 03/30/2014   Procedure: TRANSESOPHAGEAL ECHOCARDIOGRAM (TEE);  Surgeon: [name], MD;  Location: [facility] ENDOSCOPY;  Service: Cardiovascular;  Laterality: N/A;  . THYROIDECTOMY    . TONSILLECTOMY      Current Outpatient Medications  Medication Sig Dispense Refill  . acetaminophen (TYLENOL) 650 MG  CR tablet Take 650 mg by mouth every 8 (eight) hours as needed for pain.     Marland Kitchen amiodarone (PACERONE) 200 MG tablet TAKE 1 TABLET BY MOUTH  DAILY (Patient taking differently: Take 200 mg by mouth 2 (two) times daily. ) 90 tablet 3  . cetirizine (ZYRTEC) 10 MG tablet Take 10 mg by mouth daily as needed for allergies.    Marland Kitchen ELIQUIS 5 MG TABS tablet TAKE 1 TABLET BY MOUTH TWO  TIMES DAILY 180 tablet 3  . ferrous sulfate 325 (65 FE) MG tablet Take 325 mg by mouth daily with breakfast.    . fluticasone (FLONASE) 50 MCG/ACT nasal spray Place 1 spray into both nostrils at bedtime.     . lansoprazole (PREVACID) 30 MG capsule Take 30 mg by mouth daily.     Marland Kitchen levothyroxine (SYNTHROID, LEVOTHROID) 175 MCG tablet Take 175 mcg by mouth daily before breakfast.    . LORazepam (ATIVAN) 1 MG tablet Take 1.5 mg by mouth See admin instructions. Take 1.5 mg by mouth at night, may take 0.5 mg during the day as needed for anxiety    . Lutein 6 MG CAPS Take 6 mg by mouth 3 (three) times a week.     . metFORMIN (GLUCOPHAGE-XR) 500 MG 24 hr tablet Take 250-500 mg by mouth 2 (two) times daily.     . metoprolol tartrate (LOPRESSOR) 25 MG tablet TAKE ONE-HALF TABLET BY  MOUTH TWO TIMES DAILY (Patient taking differently: 12.5 mg. ) 90 tablet 2  . Multiple Vitamins-Minerals (ICAPS AREDS 2) CAPS Take 1 capsule by mouth daily.     . Polyvinyl Alcohol-Povidone PF (REFRESH) 1.4-0.6 % SOLN Place 1 drop into both eyes daily as needed (dry eyes).     . potassium chloride (KLOR-CON) 20 MEQ packet Take 20 mEq by mouth daily.     . rosuvastatin (CRESTOR) 10 MG tablet Take 10 mg by mouth at bedtime.    . sertraline (ZOLOFT) 50 MG tablet Take 50 mg by mouth daily.    . vitamin B-12 (CYANOCOBALAMIN) 500 MCG tablet Take 500 mcg by mouth daily as needed (energy).    . hydrochlorothiazide (HYDRODIURIL) 25 MG tablet Take 25 mg by mouth as needed.    . meclizine (ANTIVERT) 12.5 MG tablet Take 12.5 mg by mouth daily as needed for dizziness.      No current facility-administered medications for this encounter.     Allergies  Allergen Reactions  . Penicillins Itching, Rash and Other (See Comments)    Did it involve swelling of the face/tongue/throat, SOB, or low BP? No Did it involve sudden or severe rash/hives, skin peeling, or any reaction on the inside of your mouth or nose? No Did you need to seek medical attention at a hospital or doctor's office? No When did it last happen?30+ years If all above answers are "NO", may proceed with cephalosporin use.     Social History   Socioeconomic History  . Marital status: Divorced    Spouse name: Not on file  . Number of children: 2  . Years of education: Not on file  . Highest education level: Not on file  Occupational History  . Occupation: Retired    Comment: Education officer, museum  Social Needs  . Financial resource strain: Not on file  . Food insecurity    Worry: Not on file    Inability: Not on file  . Transportation needs    Medical: Not on file    Non-medical: Not on file  Tobacco Use  . Smoking status: Never Smoker  . Smokeless tobacco:  Never Used  Substance and Sexual Activity  . Alcohol use: No    Alcohol/week: 0.0 standard drinks  . Drug use: No  . Sexual activity: Never  Lifestyle  . Physical activity    Days per week: Not on file    Minutes per session: Not on file  . Stress: Not on file  Relationships  . Social Herbalist on phone: Not on file    Gets together: Not on file    Attends religious service: Not on file    Active member of club or organization: Not on file    Attends meetings of clubs or organizations: Not on file    Relationship status: Not on file  . Intimate partner violence    Fear of current or ex partner: Not on file    Emotionally abused: Not on file    Physically abused: Not on file    Forced sexual activity: Not on file  Other Topics Concern  . Not on file  Social History Narrative  . Not on file    Family History  Problem Relation Age of Onset  . Prostate cancer Father   . Diabetes Sister   . Heart disease Sister   . Heart attack Brother   . Heart disease Brother   . Diabetes Brother   . Diabetes Sister   . Diabetes Sister   . Pancreatic cancer Sister   . Diabetes Brother   . Heart disease Brother   . Diabetes Brother   . Heart disease Brother   . Diabetes Brother   . Heart disease Brother   . Diabetes Brother   . Heart disease Brother   . Diabetes Brother   . Heart disease Brother   . Diabetes Brother   . Diabetes Child     ROS- All systems are reviewed and negative except as per the HPI above  Physical Exam: Vitals:   05/12/19 1520  BP: 124/72  SpO2: 98%  Weight: 86.6 kg  Height: 5\' 6"  (1.676 m)   Wt Readings from Last 3 Encounters:  05/12/19 86.6 kg  04/02/19 83 kg  01/06/19 87.1 kg    Labs: Lab Results  Component Value Date   NA 139 12/27/2018   K 3.7 12/27/2018   CL 107 12/12/2018   CO2 25 12/12/2018   GLUCOSE 138 (H) 12/27/2018   BUN 18 12/12/2018   CREATININE 0.97 12/12/2018   CALCIUM 9.1 12/12/2018   MG 2.2 06/07/2017    Lab Results  Component Value Date   INR 1.12 04/20/2016   No results found for: CHOL, HDL, LDLCALC, TRIG   GEN- The patient is well appearing, alert and oriented x 3 today.   Head- normocephalic, atraumatic Eyes-  Sclera clear, conjunctiva pink Ears- hearing intact Oropharynx- clear Neck- supple, no JVP Lymph- no cervical lymphadenopathy Lungs- Clear to ausculation bilaterally, normal work of breathing Heart- irregular rate and rhythm, no murmurs, rubs or gallops, PMI not laterally displaced GI- soft, NT, ND, + BS Extremities- no clubbing, cyanosis, or edema MS- no significant deformity or atrophy Skin- no rash or lesion Psych- euthymic mood, full affect Neuro- strength and sensation are intact  EKG- afib at 86 bpm, qrs int 102 ms, qtc 500 ms Epic records reviewed    Assessment and Plan: 1. Persistent afib  She is currently on amiodarone 200 mg bid since Friday Will plan on cardioversion Continue metoprolol 12.5  mg bid Continue eliquis 5 mg bid for chadsvasc score of at least 5, states no missed doses She will check on the exact date that she stopped anticoagulation for her teeth extraction,  CV set up tentatively for 7/7, so would have to have been before 6/16 She will call back with that info in the am  2. HTN Stable Hold losartan for now  F/u here after one week  Addendum- 7/2, pt called back to the office and remembered she did not have to hold anticoagulation for her teeth extraction, so she will have cardioversion as scheduled 7/7.  Geroge Baseman [facility]a [facility] [address] [place], [place] [address] [place] Khiya Friese, [facility] [address] [place], [place] [address] [phone]

## 2019-05-13 ENCOUNTER — Other Ambulatory Visit: Payer: Medicare Other

## 2019-05-14 ENCOUNTER — Other Ambulatory Visit: Payer: Medicare Other

## 2019-05-16 ENCOUNTER — Other Ambulatory Visit (HOSPITAL_COMMUNITY)
Admission: RE | Admit: 2019-05-16 | Discharge: 2019-05-16 | Disposition: A | Discharge status: Home / Self Care | Payer: Medicare Other | Source: Ambulatory Visit | Attending: Internal Medicine | Admitting: Internal Medicine

## 2019-05-16 DIAGNOSIS — Z01812 Encounter for preprocedural laboratory examination: Secondary | ICD-10-CM | POA: Diagnosis present

## 2019-05-16 DIAGNOSIS — Z1159 Encounter for screening for other viral diseases: Secondary | ICD-10-CM | POA: Insufficient documentation

## 2019-05-16 LAB — SARS CORONAVIRUS 2 (TAT 6-24 HRS): SARS Coronavirus 2: NEGATIVE

## 2019-05-19 NOTE — Progress Notes (Signed)
Spoke with patient who confirms that she has remained quarantined and is not showing any s/s of COVID 19 since her COVID screening. Jobe Igo, RN

## 2019-05-20 ENCOUNTER — Ambulatory Visit (HOSPITAL_COMMUNITY): Payer: Medicare Other | Admitting: Registered Nurse

## 2019-05-20 ENCOUNTER — Ambulatory Visit (HOSPITAL_COMMUNITY)
Admission: RE | Admit: 2019-05-20 | Discharge: 2019-05-20 | Disposition: A | Discharge status: Home / Self Care | Payer: Medicare Other | Attending: Internal Medicine | Admitting: Internal Medicine

## 2019-05-20 ENCOUNTER — Encounter (HOSPITAL_COMMUNITY): Payer: Self-pay

## 2019-05-20 ENCOUNTER — Encounter (HOSPITAL_COMMUNITY)
Admission: RE | Disposition: A | Discharge status: Home / Self Care | Payer: Self-pay | Source: Home / Self Care | Attending: Internal Medicine

## 2019-05-20 ENCOUNTER — Other Ambulatory Visit: Payer: Self-pay | Admitting: Internal Medicine

## 2019-05-20 DIAGNOSIS — Z7901 Long term (current) use of anticoagulants: Secondary | ICD-10-CM | POA: Insufficient documentation

## 2019-05-20 DIAGNOSIS — E890 Postprocedural hypothyroidism: Secondary | ICD-10-CM | POA: Diagnosis not present

## 2019-05-20 DIAGNOSIS — Z833 Family history of diabetes mellitus: Secondary | ICD-10-CM | POA: Diagnosis not present

## 2019-05-20 DIAGNOSIS — Z90711 Acquired absence of uterus with remaining cervical stump: Secondary | ICD-10-CM | POA: Diagnosis not present

## 2019-05-20 DIAGNOSIS — E119 Type 2 diabetes mellitus without complications: Secondary | ICD-10-CM | POA: Insufficient documentation

## 2019-05-20 DIAGNOSIS — M1990 Unspecified osteoarthritis, unspecified site: Secondary | ICD-10-CM | POA: Insufficient documentation

## 2019-05-20 DIAGNOSIS — G43909 Migraine, unspecified, not intractable, without status migrainosus: Secondary | ICD-10-CM | POA: Diagnosis not present

## 2019-05-20 DIAGNOSIS — Z8249 Family history of ischemic heart disease and other diseases of the circulatory system: Secondary | ICD-10-CM | POA: Insufficient documentation

## 2019-05-20 DIAGNOSIS — D649 Anemia, unspecified: Secondary | ICD-10-CM | POA: Insufficient documentation

## 2019-05-20 DIAGNOSIS — I420 Dilated cardiomyopathy: Secondary | ICD-10-CM | POA: Diagnosis not present

## 2019-05-20 DIAGNOSIS — K219 Gastro-esophageal reflux disease without esophagitis: Secondary | ICD-10-CM | POA: Insufficient documentation

## 2019-05-20 DIAGNOSIS — K589 Irritable bowel syndrome without diarrhea: Secondary | ICD-10-CM | POA: Insufficient documentation

## 2019-05-20 DIAGNOSIS — Z880 Allergy status to penicillin: Secondary | ICD-10-CM | POA: Diagnosis not present

## 2019-05-20 DIAGNOSIS — I4819 Other persistent atrial fibrillation: Secondary | ICD-10-CM

## 2019-05-20 DIAGNOSIS — I4811 Longstanding persistent atrial fibrillation: Secondary | ICD-10-CM

## 2019-05-20 DIAGNOSIS — I10 Essential (primary) hypertension: Secondary | ICD-10-CM | POA: Insufficient documentation

## 2019-05-20 DIAGNOSIS — Z79890 Hormone replacement therapy (postmenopausal): Secondary | ICD-10-CM | POA: Diagnosis not present

## 2019-05-20 DIAGNOSIS — Z79899 Other long term (current) drug therapy: Secondary | ICD-10-CM | POA: Insufficient documentation

## 2019-05-20 DIAGNOSIS — Z7984 Long term (current) use of oral hypoglycemic drugs: Secondary | ICD-10-CM | POA: Diagnosis not present

## 2019-05-20 DIAGNOSIS — G4733 Obstructive sleep apnea (adult) (pediatric): Secondary | ICD-10-CM | POA: Diagnosis not present

## 2019-05-20 DIAGNOSIS — H409 Unspecified glaucoma: Secondary | ICD-10-CM | POA: Insufficient documentation

## 2019-05-20 DIAGNOSIS — F419 Anxiety disorder, unspecified: Secondary | ICD-10-CM | POA: Insufficient documentation

## 2019-05-20 HISTORY — PX: CARDIOVERSION: SHX1299

## 2019-05-20 LAB — GLUCOSE, CAPILLARY: Glucose-Capillary: 119 mg/dL — ABNORMAL HIGH (ref 70–99)

## 2019-05-20 SURGERY — CARDIOVERSION
Anesthesia: General

## 2019-05-20 MED ORDER — LIDOCAINE 2% (20 MG/ML) 5 ML SYRINGE
INTRAMUSCULAR | Status: DC | PRN
  Administered 2019-05-20: 60 mg via INTRAVENOUS

## 2019-05-20 MED ORDER — METOPROLOL TARTRATE 25 MG PO TABS: 12.5000 mg | ORAL_TABLET | Freq: Two times a day (BID) | ORAL | 0 refills | Status: DC

## 2019-05-20 MED ORDER — PROPOFOL 10 MG/ML IV BOLUS
INTRAVENOUS | Status: DC | PRN
  Administered 2019-05-20: 50 mg via INTRAVENOUS

## 2019-05-20 MED ORDER — SODIUM CHLORIDE 0.9 % IV SOLN
INTRAVENOUS | Status: DC | PRN
  Administered 2019-05-20: 12:00:00 via INTRAVENOUS

## 2019-05-20 NOTE — Discharge Instructions (Signed)

## 2019-05-20 NOTE — CV Procedure (Signed)
Procedure: Electrical Cardioversion Indications:  Atrial Fibrillation  Procedure Details:  Consent: Risks of procedure as well as the alternatives and risks of each were explained to the (patient/caregiver).  Consent for procedure obtained.  Time Out: Verified patient identification, verified procedure, site/side was marked, verified correct patient position, special equipment/implants available, medications/allergies/relevent history reviewed, required imaging and test results available. PERFORMED.  Patient placed on cardiac monitor, pulse oximetry, supplemental oxygen as necessary.  Sedation given: per anesthesia Pacer pads placed anterior and posterior chest.  Cardioverted 1 time(s).  Cardioversion with synchronized biphasic 120J shock.  Evaluation: Findings: Post procedure EKG shows: NSR Complications: None Patient did tolerate procedure well.  Time Spent Directly with the Patient:  30 minutes   [name] 05/20/2019, 12:36 PM

## 2019-05-20 NOTE — Anesthesia Postprocedure Evaluation (Signed)
Anesthesia Post Note  Patient: [name]  Procedure(s) Performed: CARDIOVERSION (N/A )     Patient location during evaluation: Endoscopy Anesthesia Type: General Level of consciousness: awake and alert Pain management: pain level controlled Vital Signs Assessment: post-procedure vital signs reviewed and stable Respiratory status: spontaneous breathing, nonlabored ventilation and respiratory function stable Cardiovascular status: blood pressure returned to baseline and stable Postop Assessment: no apparent nausea or vomiting Anesthetic complications: no    Last Vitals:  Vitals:   05/20/19 1240 05/20/19 1250  BP: (!) 143/71 (!) 154/77  Pulse: (!) 52 61  Resp: (!) 22 19  Temp: 37 C   SpO2: 94% 95%    Last Pain:  Vitals:   05/20/19 1250  TempSrc:   PainSc: 0-No pain                 Lorelie Biermann,W. EDMOND

## 2019-05-20 NOTE — Interval H&P Note (Signed)
History and Physical Interval Note:  05/20/2019 12:27 PM  [name]  has presented today for surgery, with the diagnosis of atrial fibrillation.  The various methods of treatment have been discussed with the patient and family. After consideration of risks, benefits and other options for treatment, the patient has consented to  Procedure(s): CARDIOVERSION (N/A) as a surgical intervention.  The patient's history has been reviewed, patient examined, no change in status, stable for surgery.  I have reviewed the patient's chart and labs.  Questions were answered to the patient's satisfaction.     [name]

## 2019-05-20 NOTE — Transfer of Care (Signed)
Immediate Anesthesia Transfer of Care Note  Patient: [name]  Procedure(s) Performed: CARDIOVERSION (N/A )  Patient Location: Endoscopy Unit  Anesthesia Type:General  Level of Consciousness: drowsy and patient cooperative  Airway & Oxygen Therapy: Patient Spontanous Breathing and Patient connected to face mask oxygen  Post-op Assessment: Report given to RN and Post -op Vital signs reviewed and stable  Post vital signs: Reviewed and stable  Last Vitals:  Vitals Value Taken Time  BP    Temp    Pulse    Resp    SpO2      Last Pain:  Vitals:   05/20/19 1212  TempSrc: Other (Comment)  PainSc: 0-No pain         Complications: No apparent anesthesia complications

## 2019-05-20 NOTE — Anesthesia Procedure Notes (Signed)
Procedure Name: General with mask airway Date/Time: 05/20/2019 12:33 PM Performed by: Orlie Dakin, CRNA Pre-anesthesia Checklist: Patient identified, Emergency Drugs available, Suction available and Patient being monitored Patient Re-evaluated:Patient Re-evaluated prior to induction Oxygen Delivery Method: Ambu bag Preoxygenation: Pre-oxygenation with 100% oxygen Induction Type: IV induction

## 2019-05-20 NOTE — Anesthesia Preprocedure Evaluation (Addendum)
Anesthesia Evaluation  Patient identified by MRN, date of birth, ID band Patient awake    Reviewed: Allergy & Precautions, H&P , NPO status , Patient's Chart, lab work & pertinent test results, reviewed documented beta blocker date and time   Airway Mallampati: II  TM Distance: >3 FB Neck ROM: Full    Dental no notable dental hx. (+) Teeth Intact, Dental Advisory Given   Pulmonary sleep apnea ,    Pulmonary exam normal breath sounds clear to auscultation       Cardiovascular hypertension, Pt. on medications and Pt. on home beta blockers + dysrhythmias Atrial Fibrillation  Rhythm:Irregular Rate:Normal     Neuro/Psych  Headaches, Anxiety    GI/Hepatic Neg liver ROS, hiatal hernia, GERD  Medicated and Controlled,  Endo/Other  diabetes, Type 2, Oral Hypoglycemic AgentsHypothyroidism   Renal/GU negative Renal ROS  negative genitourinary   Musculoskeletal  (+) Arthritis , Osteoarthritis,    Abdominal   Peds  Hematology  (+) Blood dyscrasia, anemia ,   Anesthesia Other Findings   Reproductive/Obstetrics negative OB ROS                            Anesthesia Physical Anesthesia Plan  ASA: III  Anesthesia Plan: General   Post-op Pain Management:    Induction: Intravenous  PONV Risk Score and Plan: 3 and Treatment may vary due to age or medical condition  Airway Management Planned: Mask  Additional Equipment:   Intra-op Plan:   Post-operative Plan:   Informed Consent: I have reviewed the patients History and Physical, chart, labs and discussed the procedure including the risks, benefits and alternatives for the proposed anesthesia with the patient or authorized representative who has indicated his/her understanding and acceptance.     Dental advisory given  Plan Discussed with: CRNA  Anesthesia Plan Comments:         Anesthesia Quick Evaluation

## 2019-05-21 ENCOUNTER — Encounter (HOSPITAL_COMMUNITY): Payer: Self-pay | Admitting: Internal Medicine

## 2019-05-28 ENCOUNTER — Encounter (HOSPITAL_COMMUNITY): Payer: Self-pay | Admitting: Nurse Practitioner

## 2019-05-28 ENCOUNTER — Other Ambulatory Visit: Payer: Self-pay

## 2019-05-28 ENCOUNTER — Ambulatory Visit (HOSPITAL_COMMUNITY)
Admission: RE | Admit: 2019-05-28 | Discharge: 2019-05-28 | Disposition: A | Discharge status: Home / Self Care | Payer: Medicare Other | Source: Ambulatory Visit | Attending: Nurse Practitioner | Admitting: Nurse Practitioner

## 2019-05-28 VITALS — BP 132/74 | HR 61 | Ht 66.0 in | Wt 188.0 lb

## 2019-05-28 DIAGNOSIS — Z880 Allergy status to penicillin: Secondary | ICD-10-CM | POA: Diagnosis not present

## 2019-05-28 DIAGNOSIS — H409 Unspecified glaucoma: Secondary | ICD-10-CM | POA: Diagnosis not present

## 2019-05-28 DIAGNOSIS — I420 Dilated cardiomyopathy: Secondary | ICD-10-CM | POA: Insufficient documentation

## 2019-05-28 DIAGNOSIS — E119 Type 2 diabetes mellitus without complications: Secondary | ICD-10-CM | POA: Diagnosis not present

## 2019-05-28 DIAGNOSIS — Z833 Family history of diabetes mellitus: Secondary | ICD-10-CM | POA: Insufficient documentation

## 2019-05-28 DIAGNOSIS — Z8541 Personal history of malignant neoplasm of cervix uteri: Secondary | ICD-10-CM | POA: Insufficient documentation

## 2019-05-28 DIAGNOSIS — Z79899 Other long term (current) drug therapy: Secondary | ICD-10-CM | POA: Diagnosis not present

## 2019-05-28 DIAGNOSIS — Z8042 Family history of malignant neoplasm of prostate: Secondary | ICD-10-CM | POA: Diagnosis not present

## 2019-05-28 DIAGNOSIS — G4733 Obstructive sleep apnea (adult) (pediatric): Secondary | ICD-10-CM | POA: Diagnosis not present

## 2019-05-28 DIAGNOSIS — E890 Postprocedural hypothyroidism: Secondary | ICD-10-CM | POA: Insufficient documentation

## 2019-05-28 DIAGNOSIS — Z7984 Long term (current) use of oral hypoglycemic drugs: Secondary | ICD-10-CM | POA: Diagnosis not present

## 2019-05-28 DIAGNOSIS — I4819 Other persistent atrial fibrillation: Secondary | ICD-10-CM | POA: Diagnosis present

## 2019-05-28 DIAGNOSIS — Z853 Personal history of malignant neoplasm of breast: Secondary | ICD-10-CM | POA: Diagnosis not present

## 2019-05-28 DIAGNOSIS — D509 Iron deficiency anemia, unspecified: Secondary | ICD-10-CM | POA: Insufficient documentation

## 2019-05-28 DIAGNOSIS — Z8249 Family history of ischemic heart disease and other diseases of the circulatory system: Secondary | ICD-10-CM | POA: Diagnosis not present

## 2019-05-28 DIAGNOSIS — Z800 Family history of malignant neoplasm of digestive organs: Secondary | ICD-10-CM | POA: Insufficient documentation

## 2019-05-28 DIAGNOSIS — K219 Gastro-esophageal reflux disease without esophagitis: Secondary | ICD-10-CM | POA: Diagnosis not present

## 2019-05-28 DIAGNOSIS — M1990 Unspecified osteoarthritis, unspecified site: Secondary | ICD-10-CM | POA: Insufficient documentation

## 2019-05-28 DIAGNOSIS — F411 Generalized anxiety disorder: Secondary | ICD-10-CM | POA: Insufficient documentation

## 2019-05-28 DIAGNOSIS — E7800 Pure hypercholesterolemia, unspecified: Secondary | ICD-10-CM | POA: Diagnosis not present

## 2019-05-28 DIAGNOSIS — Z85850 Personal history of malignant neoplasm of thyroid: Secondary | ICD-10-CM | POA: Insufficient documentation

## 2019-05-28 DIAGNOSIS — I10 Essential (primary) hypertension: Secondary | ICD-10-CM | POA: Diagnosis not present

## 2019-05-28 DIAGNOSIS — Z79890 Hormone replacement therapy (postmenopausal): Secondary | ICD-10-CM | POA: Insufficient documentation

## 2019-05-28 DIAGNOSIS — Z7901 Long term (current) use of anticoagulants: Secondary | ICD-10-CM | POA: Insufficient documentation

## 2019-05-28 NOTE — Progress Notes (Signed)
Primary Care Physician: Lavone Orn, MD Cardiologist: [name] Pulmonologist: Dr. Elsworth Soho GI: Dr. Dolphus Jenny [name] is a 85 y.o. female with a h/o afib that has been on amiodarone since June of 2015. She has had many breakthrough events, last one in February with successful cardioversion. She does feel very weak in afib. She went into afib around 2 weeks ago and a RN came out to her house on Friday and documented the afib.  Dr. Rayann Heman was called and left instructions to increase amio to 200 mg bid and stop losartan. Apparently her BP was low on that day.  She is in now in the afib clinc and she is rate controlled and BP is stable. She reports around 2-3 weeks ago she first had 2 teeth pulled and then a shot of cortisone in her knee. She hasn't felt well since. She usually requires a cardioversion to restore SR.    F/u in afib clinic 7/15 after successful cardioversion. She is in SR today and feels improved. She is back to Amio 200 mg daily.  Today, she denies symptoms of chest pain, shortness of breath, orthopnea, PND, lower extremity edema, dizziness, presyncope, syncope, or neurologic sequela.Perisistent cough. The patient is tolerating medications without difficulties and is otherwise without complaint today.   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   Allergy    SEASONAL   Anemia    years ago   ANEMIA, IRON DEFICIENCY 10/14/2010   Qualifier: Diagnosis of  By: Sharlett Iles MD Cline Cools R    Anemia, unspecified 10/13/2010   Qualifier: Diagnosis of  By: Nils Pyle CMA (AAMA), Leisha     Anticoagulated by anticoagulation treatment - Eliquis 03/30/2014   Started May 2015    Anxiety state 10/13/2010   Qualifier: Diagnosis of  By: Nils Pyle CMA (AAMA), Leisha     Arthritis    "all my joints" (03/31/2014)     Atrial fibrillation ([place])    Atrial fibrillation with RVR ([place]) 03/31/2015   DLCO dropped from 100% in 2015-50% in 01/2017   Barrett's esophagus    Breast cancer [facility])    s/p right breast lumpectomy   CARDIAC MURMUR 10/13/2010   Qualifier: Diagnosis of  By: Nils Pyle CMA (AAMA), Leisha     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   Cervical cancer ([place])    Chronic cough 01/02/2017   GERD, hiatal hernia Doubt amio   COLONIC POLYPS, ADENOMATOUS, HX OF 10/13/2010   Qualifier: Diagnosis of  By: Nils Pyle CMA ([place]), Leisha     Degenerative disorder of eye 07/25/2012   Diabetes mellitus without complication ([place])    Dilated cardiomyopathy ([place]) 08/27/2018   DIVERTICULOSIS, COLON 10/13/2010   Qualifier: Diagnosis of  By: Nils Pyle CMA (AAMA), Leisha     DJD (degenerative joint disease) of knee    Dysrhythmia    Afib   EROSIVE ESOPHAGITIS 10/13/2010   Qualifier: Diagnosis of  By: Nils Pyle CMA (AAMA), Leisha     Error, refractive, myopia 07/09/2012   Essential hypertension 10/13/2010   Qualifier: Diagnosis of  By: Nils Pyle CMA (AAMA), Rueben Bash, intermittent 07/09/2012   GERD 10/14/2010   Qualifier: Diagnosis of  By: Sharlett Iles MD Byrd Hesselbach    GERD (gastroesophageal reflux disease)    Glaucoma    H/O hiatal hernia    Heart  murmur    Hiatal hernia s/p robotic repair & fundoplication 5/42/7062 37/04/2830   Qualifier: Diagnosis of  By: Nils Pyle CMA (AAMA), Mearl Latin     History of laser assisted in situ keratomileusis 07/25/2012   Hx of transesophageal echocardiography (TEE) for monitoring    TEE (03/2014): No LAA clot, moderate LAE, core triatriatum type structure in RA with no stenosis, normal EF 55-60%, mild MR   HYPERCHOLESTEROLEMIA 10/13/2010   Qualifier: Diagnosis of  By: Nils Pyle CMA (AAMA), Leisha     Hypertension    Hypothyroidism, postsurgical    Irritable bowel syndrome 10/13/2010   Qualifier: Diagnosis of  By: Nils Pyle CMA (AAMA), Leisha     Lumbar stenosis with  neurogenic claudication 04/20/2016   Malignant neoplasm of breast ([place]) 02/25/2016   Migraine    "last one was years ago" (03/31/2014)   MIGRAINE HEADACHE 10/13/2010   Qualifier: Diagnosis of  By: Nils Pyle CMA (AAMA), Leisha     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   Overview:  X(T)    OSA on CPAP    settings at 10-11    OSTEOARTHRITIS 10/13/2010   Qualifier: Diagnosis of  By: Nils Pyle CMA (AAMA), Leisha     Post-surgical hypothyroidism 04/09/2014   RECTAL FISSURE 10/13/2010   Qualifier: Diagnosis of  By: Nils Pyle CMA (AAMA), Mearl Latin     S/P Nissen fundoplication (without gastrostomy tube) procedure 05/25/2017   Sleep apnea    THYROID CANCER 10/13/2010   Qualifier: Diagnosis of  By: Nils Pyle CMA Deborra Medina), Mearl Latin     Thyroid cancer [facility])    s/p thyroidectomy   Type II diabetes mellitus ([place])    type II    Past Surgical History:  Procedure Laterality Date   ABDOMINAL HYSTERECTOMY     "partial"   BREAST BIOPSY Right    BREAST LUMPECTOMY Right    CARDIOVERSION N/A 03/30/2014   Procedure: CARDIOVERSION - BEDSIDE;  Surgeon: [name], MD;  Location: [place];  Service: Cardiovascular;  Laterality: N/A;   CARDIOVERSION N/A 03/31/2014   Procedure: CARDIOVERSION  (BEDSIDE) ;  Surgeon: Lelon Perla, MD;  Location: [place];  Service: Cardiovascular;  Laterality: N/A;   CARDIOVERSION N/A 04/27/2014   Procedure: CARDIOVERSION;  Surgeon: Darlin Coco, MD;  Location: [place];  Service: Cardiovascular;  Laterality: N/A;   CARDIOVERSION N/A 04/13/2015   Procedure: CARDIOVERSION;  Surgeon: Thayer Headings, MD;  Location: [place];  Service: Cardiovascular;  Laterality: N/A;   CARDIOVERSION N/A 01/08/2017   Procedure: CARDIOVERSION;  Surgeon: Sanda Klein, MD;  Location: [facility];  Service: Cardiovascular;  Laterality: N/A;   CARDIOVERSION N/A 09/19/2017   Procedure: CARDIOVERSION;  Surgeon: [name], MD;  Location: [place];  Service: Cardiovascular;  Laterality: N/A;    CARDIOVERSION N/A 10/25/2017   Procedure: CARDIOVERSION;  Surgeon: Nahser, Wonda Cheng, MD;  Location: [place];  Service: Cardiovascular;  Laterality: N/A;   CARDIOVERSION N/A 12/27/2018   Procedure: CARDIOVERSION;  Surgeon: [name], MD;  Location: [place];  Service: Cardiovascular;  Laterality: N/A;   CARDIOVERSION N/A 05/20/2019   Procedure: CARDIOVERSION;  Surgeon: [name], MD;  Location: [place];  Service: Cardiovascular;  Laterality: N/A;   CARPAL TUNNEL RELEASE Right    CATARACT EXTRACTION W/ INTRAOCULAR LENS  IMPLANT, BILATERAL Bilateral    COLONOSCOPY     EXCISIONAL HEMORRHOIDECTOMY     INSERTION OF MESH N/A 05/25/2017   Procedure: INSERTION OF MESH;  Surgeon: Ralene Ok, MD;  Location: [facility];  Service: General;  Laterality: N/A;   TEE  WITHOUT CARDIOVERSION N/A 03/30/2014   Procedure: TRANSESOPHAGEAL ECHOCARDIOGRAM (TEE);  Surgeon: [name], MD;  Location: [facility] END[facility];  Service: Cardiovascular;  Laterality: N/A;   THYROIDECTOMY     TONSILLECTOMY      Current Outpatient Medications  Medication Sig Dispense Refill   acetaminophen (TYLENOL) 650 MG CR tablet Take 650 mg by mouth every 8 (eight) hours.      amiodarone (PACERONE) 200 MG tablet TAKE 1 TABLET BY MOUTH  DAILY 90 tablet 3   Biotin 5 MG CAPS Take 5 mg by mouth daily.     calcium carbonate (OSCAL) 1500 (600 Ca) MG TABS tablet Take 600 mg of elemental calcium by mouth every other day. At night     cetirizine (ZYRTEC) 10 MG tablet Take 10 mg by mouth daily.      chlorhexidine (PERIDEX) 0.12 % solution 5 mLs by Mouth Rinse route daily.     cholecalciferol (VITAMIN D3) 25 MCG (1000 UT) tablet Take 2,000 Units by mouth daily.     ELIQUIS 5 MG TABS tablet TAKE 1 TABLET BY MOUTH TWO  TIMES DAILY (Patient taking differently: Take 5 mg by mouth 2 (two) times daily. ) 180 tablet 3   ferrous sulfate 325 (65 FE) MG tablet Take 325 mg by mouth daily with breakfast.      fluticasone (FLONASE) 50 MCG/ACT nasal spray Place 1 spray into both nostrils daily as needed for allergies.      hydrochlorothiazide (HYDRODIURIL) 25 MG tablet Take 25 mg by mouth daily as needed (swelling).      lansoprazole (PREVACID) 30 MG capsule Take 30 mg by mouth daily.      levothyroxine (SYNTHROID, LEVOTHROID) 175 MCG tablet Take 175 mcg by mouth daily before breakfast.     LORazepam (ATIVAN) 1 MG tablet Take 1.5 mg by mouth at bedtime.      metFORMIN (GLUCOPHAGE-XR) 500 MG 24 hr tablet Take 500 mg by mouth 2 (two) times daily.      metoprolol tartrate (LOPRESSOR) 25 MG tablet Take 0.5 tablets (12.5 mg total) by mouth 2 (two) times daily. 30 tablet 0   Multiple Vitamins-Minerals (ICAPS AREDS 2) CAPS Take 1 capsule by mouth daily.      Multiple Vitamins-Minerals (ZINC PO) Take 20 mg by mouth once a week.     Polyvinyl Alcohol-Povidone PF (REFRESH) 1.4-0.6 % SOLN Place 1 drop into both eyes daily as needed (dry eyes).      potassium chloride (KLOR-CON) 20 MEQ packet Take 20 mEq by mouth daily.      rosuvastatin (CRESTOR) 10 MG tablet Take 10 mg by mouth at bedtime.     sertraline (ZOLOFT) 50 MG tablet Take 50 mg by mouth daily.     vitamin B-12 (CYANOCOBALAMIN) 1000 MCG tablet Take 1,000 mcg by mouth daily as needed (energy).     No current facility-administered medications for this encounter.     Allergies  Allergen Reactions   Penicillins Itching, Rash and Other (See Comments)    Did it involve swelling of the face/tongue/throat, SOB, or low BP? No Did it involve sudden or severe rash/hives, skin peeling, or any reaction on the inside of your mouth or nose? No Did you need to seek medical attention at a hospital or doctor's office? No When did it last happen?30+ years If all above answers are "NO", may proceed with cephalosporin use.     Social History   Socioeconomic History   Marital status: Divorced    Spouse name: Not on  file   Number of children:  2   Years of education: Not on file   Highest education level: Not on file  Occupational History   Occupation: Retired    Comment: school Scientist, research (physical sciences) strain: Not on file   Food insecurity    Worry: Not on file    Inability: Not on file   Transportation needs    Medical: Not on file    Non-medical: Not on file  Tobacco Use   Smoking status: Never Smoker   Smokeless tobacco: Never Used  Substance and Sexual Activity   Alcohol use: No    Alcohol/week: 0.0 standard drinks   Drug use: No   Sexual activity: Never  Lifestyle   Physical activity    Days per week: Not on file    Minutes per session: Not on file   Stress: Not on file  Relationships   Social connections    Talks on phone: Not on file    Gets together: Not on file    Attends religious service: Not on file    Active member of club or organization: Not on file    Attends meetings of clubs or organizations: Not on file    Relationship status: Not on file   Intimate partner violence    Fear of current or ex partner: Not on file    Emotionally abused: Not on file    Physically abused: Not on file    Forced sexual activity: Not on file  Other Topics Concern   Not on file  Social History Narrative   Not on file    Family History  Problem Relation Age of Onset   Prostate cancer Father    Diabetes Sister    Heart disease Sister    Heart attack Brother    Heart disease Brother    Diabetes Brother    Diabetes Sister    Diabetes Sister    Pancreatic cancer Sister    Diabetes Brother    Heart disease Brother    Diabetes Brother    Heart disease Brother    Diabetes Brother    Heart disease Brother    Diabetes Brother    Heart disease Brother    Diabetes Brother    Heart disease Brother    Diabetes Brother    Diabetes Child     ROS- All systems are reviewed and negative except as per the HPI above  Physical Exam: Vitals:   05/28/19  1506  BP: 132/74  Pulse: 61  SpO2: 96%  Weight: 85.3 kg  Height: 5\' 6"  (1.676 m)   Wt Readings from Last 3 Encounters:  05/28/19 85.3 kg  05/20/19 86.6 kg  05/12/19 86.6 kg    Labs: Lab Results  Component Value Date   NA 141 05/12/2019   K 4.1 05/12/2019   CL 110 05/12/2019   CO2 25 05/12/2019   GLUCOSE 150 (H) 05/12/2019   BUN 21 05/12/2019   CREATININE 0.94 05/12/2019   CALCIUM 9.0 05/12/2019   MG 2.2 06/07/2017   Lab Results  Component Value Date   INR 1.12 04/20/2016   No results found for: CHOL, HDL, LDLCALC, TRIG   GEN- The patient is well appearing, alert and oriented x 3 today.   Head- normocephalic, atraumatic Eyes-  Sclera clear, conjunctiva pink Ears- hearing intact Oropharynx- clear Neck- supple, no JVP Lymph- no cervical lymphadenopathy Lungs- Clear to ausculation bilaterally, normal work of breathing Heart- regular rate and  rhythm, no murmurs, rubs or gallops, PMI not laterally displaced GI- soft, NT, ND, + BS Extremities- no clubbing, cyanosis, or edema MS- no significant deformity or atrophy Skin- no rash or lesion Psych- euthymic mood, full affect Neuro- strength and sensation are intact  EKG- NSR at 61 bpm, pr int 188 ms, qrs int 90 ms, qtc 485 Epic records reviewed    Assessment and Plan: 1. Persistent afib S/p successful cardioversion and remains in SR Reloaded  on amiodarone 200 mg bid x 2 weeks and now back to 200 mg daily Continue metoprolol 12.5 mg bid Continue eliquis 5 mg bid for chadsvasc score of at least 5, reminded not to  miss doses x 4 weeks after a cardioversion  2. HTN Stable Hold losartan for now  F/u with Richardson Dopp, PA in November as scheduled    Geroge Baseman. Jamyron Redd, [facility] [address] [place], [place] [address] [phone]

## 2019-06-12 ENCOUNTER — Telehealth (HOSPITAL_COMMUNITY): Payer: Self-pay | Admitting: *Deleted

## 2019-06-12 NOTE — Telephone Encounter (Signed)
Pt called in stating she has been having hallucinations at night since the cardioversion. Sees people come into her bedroom at night. Pt is taking all her medications correctly I verified them with her. Instructed pt to contact PCP to be further evaluated. Pt verbalized understanding and will call Dr. Delene Ruffini office today.

## 2019-06-13 ENCOUNTER — Telehealth: Payer: Self-pay | Admitting: Cardiovascular Disease

## 2019-06-13 NOTE — Telephone Encounter (Signed)
New Message     [name] from [facility] called in to the patient told an aide that she has been hallucinating and she wants to make the doctor aware of the issue.  Please call her back.

## 2019-06-16 NOTE — Telephone Encounter (Signed)
Instructed pt to call her PCP office for further workup.

## 2019-08-13 ENCOUNTER — Encounter

## 2019-09-01 ENCOUNTER — Other Ambulatory Visit: Payer: Self-pay | Admitting: Cardiovascular Disease

## 2019-09-01 NOTE — Telephone Encounter (Signed)
Eliquis 5mg  refill request received. Pt is 85 yrs old, weight-85.3kg, Crea-0.94 on 05/12/2019, Diagnosis-Afib, and last seen by Roderic Palau on 05/28/2019. Dose is appropriate based on dosing criteria. Will send in refill to requested pharmacy.

## 2019-09-28 NOTE — Progress Notes (Signed)
Cardiology Office Note:    Date:  09/30/2019   ID:  [name], DOB December 23, 1930, MRN [id number]  PCP:  Lavone Orn, MD  Cardiologist:  Sherren Mocha, MD  Electrophysiologist:  None   Referring MD: Lavone Orn, MD   Chief Complaint: follow-up of atrial fibrillation  History of Present Illness:    [name] is a 85 y.o. female with a history of persistent atrial fibrillation on Amiodarone and Eliquis, hypertension, hyperlipidemia,  type 2 diabetes mellitus, thyroid cancer s/p thyroidectomy with resultant hypothyroidism, breast cancer, GERD with Barrett's Esophagus, and migraine who is followed by [name] and presents today for follow up atrial fibrillation.  Patient has had longstanding issues with persistent atrial fibrillation. She has had multiple cardioversions, last one being in 12/2018, and has been treated with antiarrhythmic drug therapy. She follows closely in the [facility] and has done mostly well on Amiodarone 200mg  daily. Most recent Echo in 04/2018 showed LVEF of 65-70% with normal wall motions, grade 2 diastolic dysfunction, mild AI, moderately dilated left atrium, mildly to moderately increased PASP of 40 mmHg.  Patient was last seen by [name] for a telehealth visit in 03/2019 at which time she reported some recent chest pain that she attributed to stress due to the pandemic. She denied any exertional chest pain or other cardiac symptoms. She also had experienced a recent fall injuring her knee which was treated with a cortisone shot with some improvement. She expressed some concern about low blood pressures and noted feeling weak and fatigued at that time. Losartan was decreased to 25mg  daily. She was seen in the [facility] on 05/12/2019 after going into atrial fibrillation about 2 weeks prior. Dr. Rayann Heman recommended increasing Amiodarone to 200mg  twice daily prior to visit and recommended stopping Losartan as BP had been running low at home. At this visit,  patient was noted to be in rate controlled atrial fibrillation. She reported feeling very weak while in atrial fibrillation but denied any cardiac symptoms. TSH was low at 0.205 and Free T4 was elevated at 2.16. Plan was to recheck once back on maintenance dose of Amiodarone after DCCV. DCCV was set up for 05/20/2019 and was successful. She was seen back in the [facility] on 05/28/2019 at which time she was in sinus rhythm and reported feeling better. She was back on Amiodarone 200mg  daily at that time.  Recent Labs Review: - 06/2018 [facility]): Total Chol 149, Trig 105, HDL 53, LDL 65.  - 12/2018 ([facility]): Hgb A1c 6.6. - 04/2019: TSH 0.205 (low), Free T4 2.16 (high). - 07/2019 ([facility]): Hgb 13.8. Cr 0.79, K 4.1.  Patient presents today for follow-up.  Patient reports she has not been doing well.  She reports feeling very weak, "wobbly," and dizzy. She states this has been going on for a while but seems to be slowly getting worse. She is now ambulating with a cane in the house and a walker when she leaves the house. She states she fell on Saturday night when she woke up hallucinating that her drawer was on fire. She states she got up quickly from best and fell hitting her right forehead on her dresser. She has a bruise on right forehead today. She denies any headache or stroke like symptoms. She states she has had hallucinations for several months. She has not talked with her PCP about this. She denies any chest pain, shortness of breath, palpitations, or syncope. She does report some trouble breathing at night  but states she has sleep apnea and has not used her CPAP machine in several months. No obvious CHF symptoms. She notes urinary/bowel incontinence over the last 3-4 months. No dysuria. No abnormal bleeding in stools or urine.    Past Medical History:  Diagnosis Date  . Acute bronchitis 11/15/2017  . ADENOCARCINOMA, BREAST 10/13/2010   Qualifier: Diagnosis of  By: Nils Pyle CMA (AAMA), Mearl Latin    . 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 Allergy    SEASONAL  . Anemia    years ago  . ANEMIA, IRON DEFICIENCY 10/14/2010   Qualifier: Diagnosis of  By: Sharlett Iles MD Byrd Hesselbach   . Anemia, unspecified 10/13/2010   Qualifier: Diagnosis of  By: Nils Pyle CMA ([place]), Mearl Latin    . Anticoagulated by anticoagulation treatment - Eliquis 03/30/2014   Started May 2015   . Anxiety state 10/13/2010   Qualifier: Diagnosis of  By: Nils Pyle CMA ([place]), Mearl Latin    . Arthritis    "all my joints" (03/31/2014)  . Atrial fibrillation ([place])   . Atrial fibrillation with RVR ([place]) 03/31/2015   DLCO dropped from 100% in 2015-50% in 01/2017  . Barrett's esophagus   . Breast cancer [facility])    s/p right breast lumpectomy  . CARDIAC MURMUR 10/13/2010   Qualifier: Diagnosis of  By: Nils Pyle CMA ([place]), Mearl Latin    . 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 Cervical cancer ([place])   . Chronic cough 01/02/2017   GERD, hiatal hernia D[place] amio  . COLONIC POLYPS, ADENOMATOUS, HX OF 10/13/2010   Qualifier: Diagnosis of  By: Nils Pyle CMA (AAMA), Mearl Latin    . Degenerative disorder of eye 07/25/2012  . Diabetes mellitus without complication ([place])   . Dilated cardiomyopathy ([place]) 08/27/2018  . DIVERTICULOSIS, COLON 10/13/2010   Qualifier: Diagnosis of  By: Nils Pyle CMA (AAMA), Mearl Latin    . DJD (degenerative joint disease) of knee   . Dysrhythmia    Afib  . EROSIVE ESOPHAGITIS 10/13/2010   Qualifier: Diagnosis of  By: Nils Pyle CMA ([place]), Mearl Latin    . Error, refractive, myopia 07/09/2012  . Essential hypertension 10/13/2010   Qualifier: Diagnosis of  By: Nils Pyle CMA ([place]), Mearl Latin    . Exotropia, intermittent 07/09/2012  . GERD 10/14/2010   Qualifier: Diagnosis of  By: Sharlett Iles MD Byrd Hesselbach GERD (gastroesophageal reflux disease)   . Glaucoma   . H/O hiatal hernia   . Heart murmur   . Hiatal hernia s/p robotic repair & fundoplication [id number]   Qualifier: Diagnosis of  By: Nils Pyle CMA (AAMA), Mearl Latin    . History of laser assisted  in situ keratomileusis 07/25/2012  . Hx of transesophageal echocardiography (TEE) for monitoring    TEE (03/2014): No LAA clot, moderate LAE, core triatriatum type structure in RA with no stenosis, normal EF 55-60%, mild MR  . HYPERCHOLESTEROLEMIA 10/13/2010   Qualifier: Diagnosis of  By: Nils Pyle CMA ([place]), Mearl Latin    . Hypertension   . Hypothyroidism, postsurgical   . Irritable bowel syndrome 10/13/2010   Qualifier: Diagnosis of  By: Nils Pyle CMA (AAMA), Mearl Latin    . Lumbar stenosis with neurogenic claudication 04/20/2016  . Malignant neoplasm of breast ([place]) 02/25/2016  . Migraine    "last one was years ago" (03/31/2014)  . MIGRAINE HEADACHE 10/13/2010   Qualifier: Diagnosis of  By: Nils Pyle CMA ([place]), Mearl Latin    . 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 OSA on CPAP    settings at 10-11   .  OSTEOARTHRITIS 10/13/2010   Qualifier: Diagnosis of  By: Nils Pyle CMA ([place]), Mearl Latin    . Post-surgical hypothyroidism 04/09/2014  . RECTAL FISSURE 10/13/2010   Qualifier: Diagnosis of  By: Nils Pyle CMA ([place]), Mearl Latin    . S/P Nissen fundoplication (without gastrostomy tube) procedure 05/25/2017  . Sleep apnea   . THYROID CANCER 10/13/2010   Qualifier: Diagnosis of  By: Nils Pyle CMA ([place]), Mearl Latin    . Thyroid cancer [facility])    s/p thyroidectomy  . Type II diabetes mellitus ([place])    type II     Past Surgical History:  Procedure Laterality Date  . ABDOMINAL HYSTERECTOMY     "partial"  . BREAST BIOPSY Right   . BREAST LUMPECTOMY Right   . CARDIOVERSION N/A 03/30/2014   Procedure: CARDIOVERSION - BEDSIDE;  Surgeon: [name], MD;  Location: [place];  Service: Cardiovascular;  Laterality: N/A;  . CARDIOVERSION N/A 03/31/2014   Procedure: CARDIOVERSION  (BEDSIDE) ;  Surgeon: Lelon Perla, MD;  Location: [place];  Service: Cardiovascular;  Laterality: N/A;  . CARDIOVERSION N/A 04/27/2014   Procedure: CARDIOVERSION;  Surgeon: Darlin Coco, MD;  Location: [place];  Service: Cardiovascular;   Laterality: N/A;  . CARDIOVERSION N/A 04/13/2015   Procedure: CARDIOVERSION;  Surgeon: Thayer Headings, MD;  Location: [place];  Service: Cardiovascular;  Laterality: N/A;  . CARDIOVERSION N/A 01/08/2017   Procedure: CARDIOVERSION;  Surgeon: Sanda Klein, MD;  Location: [facility] ENDOSCOPY;  Service: Cardiovascular;  Laterality: N/A;  . CARDIOVERSION N/A 09/19/2017   Procedure: CARDIOVERSION;  Surgeon: [name], MD;  Location: [facility] ENDOSCOPY;  Service: Cardiovascular;  Laterality: N/A;  . CARDIOVERSION N/A 10/25/2017   Procedure: CARDIOVERSION;  Surgeon: Acie Fredrickson Wonda Cheng, MD;  Location: [facility] ENDOSCOPY;  Service: Cardiovascular;  Laterality: N/A;  . CARDIOVERSION N/A 12/27/2018   Procedure: CARDIOVERSION;  Surgeon: [name], MD;  Location: [facility] ENDOSCOPY;  Service: Cardiovascular;  Laterality: N/A;  . CARDIOVERSION N/A 05/20/2019   Procedure: CARDIOVERSION;  Surgeon: [name], MD;  Location: [facility] ENDOSCOPY;  Service: Cardiovascular;  Laterality: N/A;  . CARPAL TUNNEL RELEASE Right   . CATARACT EXTRACTION W/ INTRAOCULAR LENS  IMPLANT, BILATERAL Bilateral   . COLONOSCOPY    . EXCISIONAL HEMORRHOIDECTOMY    . INSERTION OF MESH N/A 05/25/2017   Procedure: INSERTION OF MESH;  Surgeon: Ralene Ok, MD;  Location: [facility];  Service: General;  Laterality: N/A;  . TEE WITHOUT CARDIOVERSION N/A 03/30/2014   Procedure: TRANSESOPHAGEAL ECHOCARDIOGRAM (TEE);  Surgeon: [name], MD;  Location: [facility] ENDOSCOPY;  Service: Cardiovascular;  Laterality: N/A;  . THYROIDECTOMY    . TONSILLECTOMY      Current Medications: Current Meds  Medication Sig  . acetaminophen (TYLENOL) 650 MG CR tablet Take 650 mg by mouth every 8 (eight) hours.   Marland Kitchen amiodarone (PACERONE) 200 MG tablet TAKE 1 TABLET BY MOUTH  DAILY  . Biotin 5 MG CAPS Take 5 mg by mouth daily.  . calcium carbonate (OSCAL) 1500 (600 Ca) MG TABS tablet Take 600 mg of elemental calcium by mouth every other day. At night  . cetirizine  (ZYRTEC) 10 MG tablet Take 10 mg by mouth daily.   . chlorhexidine (PERIDEX) 0.12 % solution 5 mLs by Mouth Rinse route daily.  . cholecalciferol (VITAMIN D3) 25 MCG (1000 UT) tablet Take 2,000 Units by mouth daily.  Marland Kitchen ELIQUIS 5 MG TABS tablet TAKE 1 TABLET BY MOUTH TWO  TIMES DAILY  . ferrous sulfate 325 (65 FE) MG tablet Take  325 mg by mouth daily with breakfast.  . fluticasone (FLONASE) 50 MCG/ACT nasal spray Place 1 spray into both nostrils daily as needed for allergies.   . hydrochlorothiazide (HYDRODIURIL) 25 MG tablet Take 25 mg by mouth daily as needed (swelling).   . lansoprazole (PREVACID) 30 MG capsule Take 30 mg by mouth daily.   Marland Kitchen levothyroxine (SYNTHROID, LEVOTHROID) 175 MCG tablet Take 175 mcg by mouth daily before breakfast.  . LORazepam (ATIVAN) 1 MG tablet Take 1.5 mg by mouth at bedtime.   . metFORMIN (GLUCOPHAGE-XR) 500 MG 24 hr tablet Take 500 mg by mouth 2 (two) times daily.   . metoprolol tartrate (LOPRESSOR) 25 MG tablet Take 0.5 tablets (12.5 mg total) by mouth 2 (two) times daily.  . Multiple Vitamins-Minerals (ICAPS AREDS 2) CAPS Take 1 capsule by mouth daily.   . Multiple Vitamins-Minerals (ZINC PO) Take 20 mg by mouth once a week.  . Polyvinyl Alcohol-Povidone PF (REFRESH) 1.4-0.6 % SOLN Place 1 drop into both eyes daily as needed (dry eyes).   . potassium chloride (KLOR-CON) 20 MEQ packet Take 20 mEq by mouth daily.   . rosuvastatin (CRESTOR) 10 MG tablet Take 10 mg by mouth at bedtime.  . sertraline (ZOLOFT) 50 MG tablet Take 50 mg by mouth daily.  . vitamin B-12 (CYANOCOBALAMIN) 1000 MCG tablet Take 1,000 mcg by mouth daily as needed (energy).     Allergies:   Penicillins   Social History   Socioeconomic History  . Marital status: Divorced    Spouse name: Not on file  . Number of children: 2  . Years of education: Not on file  . Highest education level: Not on file  Occupational History  . Occupation: Retired    Comment: Education officer, museum  Social Needs   . Financial resource strain: Not on file  . Food insecurity    Worry: Not on file    Inability: Not on file  . Transportation needs    Medical: Not on file    Non-medical: Not on file  Tobacco Use  . Smoking status: Never Smoker  . Smokeless tobacco: Never Used  Substance and Sexual Activity  . Alcohol use: No    Alcohol/week: 0.0 standard drinks  . Drug use: No  . Sexual activity: Never  Lifestyle  . Physical activity    Days per week: Not on file    Minutes per session: Not on file  . Stress: Not on file  Relationships  . Social Herbalist on phone: Not on file    Gets together: Not on file    Attends religious service: Not on file    Active member of club or organization: Not on file    Attends meetings of clubs or organizations: Not on file    Relationship status: Not on file  Other Topics Concern  . Not on file  Social History Narrative  . Not on file     Family History: The patient's family history includes Diabetes in her brother, brother, brother, brother, brother, brother, brother, child, sister, sister, and sister; Heart attack in her brother; Heart disease in her brother, brother, brother, brother, brother, brother, and sister; Pancreatic cancer in her sister; Prostate cancer in her father.  ROS:   Please see the history of present illness.    All other systems reviewed and are negative.  EKGs/Labs/Other Studies Reviewed:    The following studies were reviewed today:  Echocardiogram 04/24/2018: Study Conclusions: - Left ventricle: The cavity  size was normal. There was moderate   concentric hypertrophy. Systolic function was vigorous. The   estimated ejection fraction was in the range of 65% to 70%. Wall   motion was normal; there were no regional wall motion   abnormalities. Features are consistent with a pseudonormal left   ventricular filling pattern, with concomitant abnormal relaxation   and increased filling pressure (grade 2 diastolic  dysfunction). - Aortic valve: There was mild regurgitation. - Mitral valve: Calcified annulus. Mildly thickened leaflets .   There was mild regurgitation. - Left atrium: The atrium was moderately dilated. - Right atrium: The atrium was mildly dilated. - Tricuspid valve: There was mild regurgitation. - Pulmonary arteries: Systolic pressure was mildly to moderately   increased. PA peak pressure: 40 mm Hg (S). - Inferior vena cava: The vessel was normal in size. The   respirophasic diameter changes were in the normal range (= 50%),   consistent with normal central venous pressure. - Pericardium, extracardiac: There was no pericardial effusion.  Impressions: - When compared to the rpior study from 12/11/2016 biventricular   systolic function has improved and is now normal.  EKG:  EKG not ordered today.   Recent Labs: 05/12/2019: ALT 17; BUN 21; Creatinine, Ser 0.94; Hemoglobin 13.6; Platelets 157; Potassium 4.1; Sodium 141; TSH 0.205  Recent Lipid Panel No results found for: CHOL, TRIG, HDL, CHOLHDL, VLDL, LDLCALC, LDLDIRECT  Physical Exam:    Vital Signs: BP (!) 158/84   Pulse 66   Ht 5\' 6"  (1.676 m)   Wt 186 lb (84.4 kg)   LMP  (LMP Unknown)   SpO2 98%   BMI 30.02 kg/m     Wt Readings from Last 3 Encounters:  09/30/19 186 lb (84.4 kg)  05/28/19 188 lb (85.3 kg)  05/20/19 191 lb (86.6 kg)     General: 85 y.o. female resting comfortably in no acute distress. HEENT: Normocephalic and atraumatic. Sclera clear. EOMs intact. Neck: Supple. No carotid bruits. No JVD. Heart: RRR. Distinct S1 and S2. II-III/VI murmur noted at upper sternal border. Radial pulses 2+ and equal bilaterally. Lungs: No increased work of breathing. Clear to ausculation bilaterally. No wheezes, rhonchi, or rales.  Abdomen: Soft, non-distended, and non-tender to palpation. Bowel sounds present. MSK: Normal strength and tone for age. Extremities: No clubbing, cyanosis, or edema.    Skin: Warm and dry.  Bruise on right forehead.  Neuro: Alert and oriented x3. No focal deficits. EOMs intact. Left pupil possible slightly smaller than the right. No facial asymmetria. Upper and lower extremity strength equal bilaterally. Psych: Normal affect. Responds appropriately.  Assessment:    1. PAF (paroxysmal atrial fibrillation) ([place])   2. Chronic diastolic heart failure ([place])   3. Essential hypertension   4. Hyperlipidemia, unspecified hyperlipidemia type   5. Post-surgical hypothyroidism   6. Weakness   7. Fall, subsequent encounter   8. Urinary incontinence, unspecified type   9. Incontinence of feces, unspecified fecal incontinence type     Plan:    Atrial Fibrillation - Long history of atrial fibrillation s/p multiple cardioversion. Last DCCV in 05/2019. Has done well since that time. - No EKG obtained today but RRR on exam today. - Continue Amiodarone 200mg  daily and Lopressor 12.5mg  twice daily. - Continue chronic anticoagulation with Eliquis 5mg  twice daily.  Chronic Diastolic CHF - History of LV systolic dysfunction but EF had normalized on most recent Echo in 04/2018 after restoration of normal sinus rhythm.  - Appears euvolemic on exam.  - OK  to continue HCTZ 25mg  as needed for swelling. - Continue Lopressor as above. Previously on Losartan but this was discontinued due to soft BP.  Hypertension - BP elevated at 158/84. Home Losartan was discontinued several months ago due to soft BPs. Patient states systolic BP is often in the 150's at home but can sometimes also be very low. - Have asked patient to keep BP log for the next 3 weeks and then call us with this information. If BP consistently elevated, may need to restart low dose Losartan. However, given generalized weakness and dizziness recently, would be cautious lowering BP too much.  Hyperlipidemia - Continue Crestor 10mg  daily. - Labs followed by PCP.   Hypothyroidism - Patient has thyroid cancer s/p thyroidectomy with  resultant hypothyroidism. - TSH low and free T4 high in 04/2019 when on increased dose of Amiodarone for recurrent atrial fibrillation. - Labs have not been rechecked since. Will recheck TSH/Free T4 today now that she is back on maintenance dose of Amiodarone.   Generalized Weakness/Dizziness/ Recent Fall - Patient reports generalized weakness, unsteadiness on her feet, and dizziness which she states has been going on for a long time but has been slowly worsening.  - She fell 2 nights ago and hit the right side of her head. She has a bruise there today. Left pupil may be slightly smaller than right on exam today. Given patient is on Eliquis, will get non-contrast head CT today to make sure patient does not have a slow brain bleed. - Will also check CBC and TSH/Free T4 today.  - Advised patient to also follow-up with PCP.   Hallucinations - Patient states she has been having visual hallucinations for a while now which is what lead to her recent fall.  - Advised her to follow-up with PCP.  Urinary/Bowel Incontinence - Patient has been having urinary and bowel incontinence for the last several months. No dysuria or abnormal bleeding. - Advised her to follow-up with PCP.   Disposition: Follow up in 2-3 months with [name].   Medication Adjustments/Labs and Tests Ordered: Current medicines are reviewed at length with the patient today.  Concerns regarding medicines are outlined above.  Orders Placed This Encounter  Procedures  . CT Head Wo Contrast  . CBC w/Diff  . TSH  . T4, free   No orders of the defined types were placed in this encounter.   Patient Instructions  Your physician recommends that you continue on your current medications as directed. Please refer to the Current Medication list given to you today.   Your physician recommends that you return for lab work in:  TODAY  CBC TSH AND [place] B/P FOR 3 [place]   HEAD CT TODAY FOR FALL LAST  Saturday  Your physician recommends that you schedule a follow-up appointment in:   2 -3 MONTHS WITH DR [place] TO MAKE AN APPT FOR 1-2 WEEKS     Signed, Darreld Mclean, PA-C  09/30/2019 1:38 PM    [facility]

## 2019-09-30 ENCOUNTER — Ambulatory Visit (INDEPENDENT_AMBULATORY_CARE_PROVIDER_SITE_OTHER)
Admission: RE | Admit: 2019-09-30 | Discharge: 2019-09-30 | Disposition: A | Discharge status: Home / Self Care | Payer: Medicare Other | Source: Ambulatory Visit | Attending: Student | Admitting: Student

## 2019-09-30 ENCOUNTER — Ambulatory Visit: Payer: Medicare Other | Admitting: Student

## 2019-09-30 ENCOUNTER — Other Ambulatory Visit: Payer: Self-pay

## 2019-09-30 ENCOUNTER — Encounter (INDEPENDENT_AMBULATORY_CARE_PROVIDER_SITE_OTHER): Payer: Self-pay

## 2019-09-30 ENCOUNTER — Encounter: Payer: Self-pay | Admitting: Physician Assistant

## 2019-09-30 VITALS — BP 158/84 | HR 66 | Ht 66.0 in | Wt 186.0 lb

## 2019-09-30 DIAGNOSIS — E785 Hyperlipidemia, unspecified: Secondary | ICD-10-CM | POA: Diagnosis not present

## 2019-09-30 DIAGNOSIS — I480 Paroxysmal atrial fibrillation: Secondary | ICD-10-CM | POA: Diagnosis not present

## 2019-09-30 DIAGNOSIS — I5032 Chronic diastolic (congestive) heart failure: Secondary | ICD-10-CM | POA: Diagnosis not present

## 2019-09-30 DIAGNOSIS — R32 Unspecified urinary incontinence: Secondary | ICD-10-CM

## 2019-09-30 DIAGNOSIS — R531 Weakness: Secondary | ICD-10-CM

## 2019-09-30 DIAGNOSIS — W19XXXD Unspecified fall, subsequent encounter: Secondary | ICD-10-CM | POA: Diagnosis not present

## 2019-09-30 DIAGNOSIS — I10 Essential (primary) hypertension: Secondary | ICD-10-CM | POA: Diagnosis not present

## 2019-09-30 DIAGNOSIS — E890 Postprocedural hypothyroidism: Secondary | ICD-10-CM

## 2019-09-30 DIAGNOSIS — R159 Full incontinence of feces: Secondary | ICD-10-CM

## 2019-09-30 LAB — CBC WITH DIFFERENTIAL/PLATELET
Basophils Absolute: 0 10*3/uL (ref 0.0–0.2)
Basos: 1 %
EOS (ABSOLUTE): 0.2 10*3/uL (ref 0.0–0.4)
Eos: 3 %
Hematocrit: 38.1 % (ref 34.0–46.6)
Hemoglobin: 13 g/dL (ref 11.1–15.9)
Immature Grans (Abs): 0 10*3/uL (ref 0.0–0.1)
Immature Granulocytes: 0 %
Lymphocytes Absolute: 0.9 10*3/uL (ref 0.7–3.1)
Lymphs: 18 %
MCH: 31.6 pg (ref 26.6–33.0)
MCHC: 34.1 g/dL (ref 31.5–35.7)
MCV: 93 fL (ref 79–97)
Monocytes Absolute: 0.5 10*3/uL (ref 0.1–0.9)
Monocytes: 10 %
Neutrophils Absolute: 3.7 10*3/uL (ref 1.4–7.0)
Neutrophils: 68 %
Platelets: 174 10*3/uL (ref 150–450)
RBC: 4.12 x10E6/uL (ref 3.77–5.28)
RDW: 12.4 % (ref 11.7–15.4)
WBC: 5.3 10*3/uL (ref 3.4–10.8)

## 2019-09-30 LAB — T4, FREE: Free T4: 2.81 ng/dL — ABNORMAL HIGH (ref 0.82–1.77)

## 2019-09-30 LAB — TSH: TSH: 0.033 u[IU]/mL — ABNORMAL LOW (ref 0.450–4.500)

## 2019-09-30 NOTE — Patient Instructions (Addendum)
Your physician recommends that you continue on your current medications as directed. Please refer to the Current Medication list given to you today.   Your physician recommends that you return for lab work in:  TODAY  CBC TSH AND [place] B/P FOR 3 [place]   HEAD CT TODAY FOR FALL LAST Saturday  Your physician recommends that you schedule a follow-up appointment in:   2 -3 MONTHS WITH DR COOPER  CALL PRIMARY CARE OFFICE TO MAKE AN APPT FOR 1-2 WEEKS

## 2019-10-01 ENCOUNTER — Telehealth: Payer: Self-pay

## 2019-10-01 NOTE — Telephone Encounter (Signed)
Notes recorded by Frederik Schmidt, RN on 10/01/2019 at 8:49 AM EST  The patient has been notified of the result and verbalized understanding. All questions (if any) were answered.  Frederik Schmidt, RN 10/01/2019 8:49 AM

## 2019-10-01 NOTE — Telephone Encounter (Signed)
-----   Message from Darreld Mclean, [place] sent at 09/30/2019  7:41 PM EST ----- Please notify patient of results: CBC completely normal. No anemia. However, thyroid function tests are abnormal and are suggestive of too much thyroid hormone. Home synthroid likely needs to be adjusted. However, she will need to follow-up with her PCP for this. Can we fax PCP these labs?  Also, please let patient know that head CT showed no signs of any bleed (I tried calling these patient with CT results before leaving the office tonight but had to leave a voicemail).   Continue with plan as discussed at office visit.  Thank you!

## 2019-10-10 ENCOUNTER — Other Ambulatory Visit: Payer: Self-pay | Admitting: Cardiovascular Disease

## 2019-10-14 ENCOUNTER — Other Ambulatory Visit: Payer: Self-pay | Admitting: Cardiovascular Disease

## 2019-10-14 MED ORDER — POTASSIUM CHLORIDE 20 MEQ PO PACK: 20.0000 meq | PACK | Freq: Every day | ORAL | 3 refills | Status: DC

## 2019-10-23 ENCOUNTER — Telehealth: Payer: Self-pay | Admitting: Cardiovascular Disease

## 2019-10-23 MED ORDER — POTASSIUM CHLORIDE CRYS ER 20 MEQ PO TBCR: 20.0000 meq | EXTENDED_RELEASE_TABLET | Freq: Every day | ORAL | 3 refills | Status: DC

## 2019-10-23 NOTE — Telephone Encounter (Signed)
Called pt and pt stated that she would whether have the potassium tablet instead of the powder. Would [name] like to reorder the potassium tablets for the pt? Please address

## 2019-10-23 NOTE — Telephone Encounter (Signed)
Confirmed with [name], who spoke with the patient, that [facility] does have the tablets and she wishes to get those due to cost.  New [facility] called in.

## 2019-10-23 NOTE — Telephone Encounter (Signed)
[facility] 

## 2019-10-23 NOTE — Telephone Encounter (Signed)
The pharmacy has the powder, pt wants the tablets.

## 2019-10-23 NOTE — Telephone Encounter (Signed)
*  STAT* If patient is at the pharmacy, call can be transferred to refill team.   1. Which medications need to be refilled? (please list name of each medication and dose if known)  New prescription for Potasium- they no longer have the tablet, they have a powder  2. Which pharmacy/location (including street and city if local pharmacy) is medication to be sent to? [facility]  3. Do they need a 30 day or 90 day supply? 90 days and refills

## 2019-10-24 ENCOUNTER — Other Ambulatory Visit: Payer: Self-pay

## 2019-10-24 ENCOUNTER — Ambulatory Visit: Payer: Self-pay

## 2019-10-24 ENCOUNTER — Encounter: Payer: Self-pay | Admitting: Orthopedic Surgery

## 2019-10-24 ENCOUNTER — Ambulatory Visit: Payer: Medicare Other | Admitting: Orthopedic Surgery

## 2019-10-24 DIAGNOSIS — M170 Bilateral primary osteoarthritis of knee: Secondary | ICD-10-CM | POA: Diagnosis not present

## 2019-10-24 NOTE — Progress Notes (Signed)
Office Visit Note   Patient: [name]           Date of Birth: 1931-10-18           MRN: [id number] Visit Date: 10/24/2019 Requested by: Lavone Orn, MD [address] [place],  [place] [address] PCP: Lavone Orn, MD  Subjective: Chief Complaint  Patient presents with  . Right Knee - Pain  . Left Knee - Pain    HPI: [name] is a 85 y.o. female who presents to the office complaining of bilateral knee pain.  Patient notes that she fell 2 weeks ago, hurting her right knee.  Prior that she fell about a month ago hurting her left knee.  Patient states that her pain has been slowly improving but is still causing her significant pain.  She has had some relief with a heating pad and with taking Tylenol arthritis every 8 hours.  She denies any groin pain or back pain.  She localizes the majority of her pain to the medial aspect of bilateral knees and the anterior proximal tibia.  Patient has had gel injections and cortisone injections in the past, which have not had a significant effect according to her.  Additionally she is to avoid taking NSAIDs due to her history of atrial fibrillation.  She is on 2000 units of vitamin D daily.  She has not tried a knee brace..                ROS:  All systems reviewed are negative as they relate to the chief complaint within the history of present illness.  Patient denies fevers or chills.  Assessment & Plan: Visit Diagnoses:  1. Bilateral primary osteoarthritis of knee     Plan: Patient is an 85 year old female who presents with complaints of bilateral knee pain following multiple falls.  Patient has a long history of knee arthritis bilaterally.  X-rays taken today reveal no fractures or dislocations of bilateral knees.  Impression is acute worsening of arthritic pain due to trauma.  Offered cortisone injection versus gel injection.  Patient decided against this course of treatment.  She wants to try knee braces.  Patient will  try hinged knee brace versus knee sleeve and follow-up with the office as needed if her pain does not improve.  She agreed with this plan.  Follow-Up Instructions: No follow-ups on file.   Orders:  Orders Placed This Encounter  Procedures  . XR Knee 1-2 Views Right  . XR Knee 1-2 Views Left   No orders of the defined types were placed in this encounter.     Procedures: No procedures performed   Clinical Data: No additional findings.  Objective: Vital Signs: LMP  (LMP Unknown)   Physical Exam:  Constitutional: Patient appears well-developed HEENT:  Head: Normocephalic Eyes:EOM are normal Neck: Normal range of motion Cardiovascular: Normal rate Pulmonary/chest: Effort normal Neurologic: Patient is alert Skin: Skin is warm Psychiatric: Patient has normal mood and affect  Ortho Exam:  Bilateral knee Exam No effusion Extensor mechanism intact No TTP over the lateral jointlines, quad tendon, patellar tendon, pes anserinus, patella, tibial tubercle, LCL/MCL insertions TTP over the medial joint lines bilaterally as well as the proximal anterior medial tibia on the right Stable to varus/valgus stresses.  Stable to anterior/posterior drawer Extension to 0 degrees Flexion > 90 degrees  Specialty Comments:  No specialty comments available.  Imaging: XR Knee 1-2 Views Left  Result Date: 10/24/2019 AP lateral left  knee reviewed.  Severe tricompartmental arthritis is present.  No acute fracture.  Alignment slight varus.  XR Knee 1-2 Views Right  Result Date: 10/24/2019 AP lateral right knee reviewed.  Severe tricompartmental arthritis neuritis is present.  No acute fracture.  Alignment slight varus.    PMFS History: Patient Active Problem List   Diagnosis Date Noted  . Paroxysmal atrial fibrillation (HCC)   . Dilated cardiomyopathy ([place]) 08/27/2018  . Acute bronchitis 11/15/2017  . S/P Nissen fundoplication (without gastrostomy tube) procedure 05/25/2017  .  Longstanding persistent atrial fibrillation   . Chronic cough 01/02/2017  . Lumbar stenosis with neurogenic claudication 04/20/2016  . 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 Malignant neoplasm of breast ([place]) 02/25/2016  . 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 Post-surgical hypothyroidism 04/09/2014  . Anticoagulated by anticoagulation treatment - Eliquis 03/30/2014  . Diabetes mellitus without complication ([place])   . Heart murmur   . DJD (degenerative joint disease) of knee   . Glaucoma   . History of laser assisted in situ keratomileusis 07/25/2012  . Degenerative disorder of eye 07/25/2012  . Exotropia, intermittent 07/09/2012  . 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 Error, refractive, myopia 07/09/2012  . ANEMIA, IRON DEFICIENCY 10/14/2010  . GERD 10/14/2010  . THYROID CANCER 10/13/2010  . HYPERCHOLESTEROLEMIA 10/13/2010  . ANEMIA, UNSPECIFIED 10/13/2010  . Anxiety state 10/13/2010  . MIGRAINE HEADACHE 10/13/2010  . Essential hypertension 10/13/2010  . EROSIVE ESOPHAGITIS 10/13/2010  . BARRETTS ESOPHAGUS 10/13/2010  . Hiatal hernia s/p robotic repair & fundoplication [id number]  . DIVERTICULOSIS, COLON 10/13/2010  . Irritable bowel syndrome 10/13/2010  . OSTEOARTHRITIS 10/13/2010  . CARDIAC MURMUR 10/13/2010  . COLONIC POLYPS, ADENOMATOUS, HX OF 10/13/2010   Past Medical History:  Diagnosis Date  . Acute bronchitis 11/15/2017  . ADENOCARCINOMA, BREAST 10/13/2010   Qualifier: Diagnosis of  By: Nils Pyle CMA (AAMA), Mearl Latin    . 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 Allergy    SEASONAL  . Anemia    years ago  . ANEMIA, IRON DEFICIENCY 10/14/2010   Qualifier: Diagnosis of  By: Sharlett Iles MD Byrd Hesselbach   . Anemia, unspecified 10/13/2010   Qualifier: Diagnosis of  By: Nils Pyle CMA ([place]), Mearl Latin    . Anticoagulated by anticoagulation treatment - Eliquis 03/30/2014   Started May 2015   . Anxiety state 10/13/2010   Qualifier: Diagnosis of   By: Nils Pyle CMA ([place]), Mearl Latin    . Arthritis    "all my joints" (03/31/2014)  . Atrial fibrillation ([place])   . Atrial fibrillation with RVR ([place]) 03/31/2015   DLCO dropped from 100% in 2015-50% in 01/2017  . Barrett's esophagus   . Breast cancer [facility])    s/p right breast lumpectomy  . CARDIAC MURMUR 10/13/2010   Qualifier: Diagnosis of  By: Nils Pyle CMA ([place]), Mearl Latin    . 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 Cervical cancer ([place])   . Chronic cough 01/02/2017   GERD, hiatal hernia Doubt amio  . COLONIC POLYPS, ADENOMATOUS, HX OF 10/13/2010   Qualifier: Diagnosis of  By: Nils Pyle CMA (AAMA), Mearl Latin    . Degenerative disorder of eye 07/25/2012  . Diabetes mellitus without complication ([place])   . Dilated cardiomyopathy ([place]) 08/27/2018  . DIVERTICULOSIS, COLON 10/13/2010   Qualifier: Diagnosis of  By: Nils Pyle CMA (AAMA), Mearl Latin    . DJD (degenerative joint disease) of knee   . Dysrhythmia    Afib  . EROSIVE ESOPHAGITIS 10/13/2010   Qualifier: Diagnosis of  By: Nils Pyle CMA ([place]), Mearl Latin    .  Error, refractive, myopia 07/09/2012  . Essential hypertension 10/13/2010   Qualifier: Diagnosis of  By: Nils Pyle CMA ([place]), Mearl Latin    . Exotropia, intermittent 07/09/2012  . GERD 10/14/2010   Qualifier: Diagnosis of  By: Sharlett Iles MD Byrd Hesselbach GERD (gastroesophageal reflux disease)   . Glaucoma   . H/O hiatal hernia   . Heart murmur   . Hiatal hernia s/p robotic repair & fundoplication [id number]   Qualifier: Diagnosis of  By: Nils Pyle CMA (AAMA), Mearl Latin    . History of laser assisted in situ keratomileusis 07/25/2012  . Hx of transesophageal echocardiography (TEE) for monitoring    TEE (03/2014): No LAA clot, moderate LAE, core triatriatum type structure in RA with no stenosis, normal EF 55-60%, mild MR  . HYPERCHOLESTEROLEMIA 10/13/2010   Qualifier: Diagnosis of  By: Nils Pyle CMA ([place]), Mearl Latin    . Hypertension   . Hypothyroidism, postsurgical   . Irritable bowel syndrome 10/13/2010   Qualifier:  Diagnosis of  By: Nils Pyle CMA (AAMA), Mearl Latin    . Lumbar stenosis with neurogenic claudication 04/20/2016  . Malignant neoplasm of breast ([place]) 02/25/2016  . Migraine    "last one was years ago" (03/31/2014)  . MIGRAINE HEADACHE 10/13/2010   Qualifier: Diagnosis of  By: Nils Pyle CMA ([place]), Mearl Latin    . 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 OSA on CPAP    settings at 10-11   . OSTEOARTHRITIS 10/13/2010   Qualifier: Diagnosis of  By: Nils Pyle CMA ([place]), Mearl Latin    . Post-surgical hypothyroidism 04/09/2014  . RECTAL FISSURE 10/13/2010   Qualifier: Diagnosis of  By: Nils Pyle CMA ([place]), Mearl Latin    . S/P Nissen fundoplication (without gastrostomy tube) procedure 05/25/2017  . Sleep apnea   . THYROID CANCER 10/13/2010   Qualifier: Diagnosis of  By: Nils Pyle CMA ([place]), Mearl Latin    . Thyroid cancer [facility])    s/p thyroidectomy  . Type II diabetes mellitus ([place])    type II     Family History  Problem Relation Age of Onset  . Prostate cancer Father   . Diabetes Sister   . Heart disease Sister   . Heart attack Brother   . Heart disease Brother   . Diabetes Brother   . Diabetes Sister   . Diabetes Sister   . Pancreatic cancer Sister   . Diabetes Brother   . Heart disease Brother   . Diabetes Brother   . Heart disease Brother   . Diabetes Brother   . Heart disease Brother   . Diabetes Brother   . Heart disease Brother   . Diabetes Brother   . Heart disease Brother   . Diabetes Brother   . Diabetes Child     Past Surgical History:  Procedure Laterality Date  . ABDOMINAL HYSTERECTOMY     "partial"  . BREAST BIOPSY Right   . BREAST LUMPECTOMY Right   . CARDIOVERSION N/A 03/30/2014   Procedure: CARDIOVERSION - BEDSIDE;  Surgeon: [name], MD;  Location: [place];  Service: Cardiovascular;  Laterality: N/A;  . CARDIOVERSION N/A 03/31/2014   Procedure: CARDIOVERSION  (BEDSIDE) ;  Surgeon: Lelon Perla, MD;  Location: [place];  Service: Cardiovascular;  Laterality: N/A;  .  CARDIOVERSION N/A 04/27/2014   Procedure: CARDIOVERSION;  Surgeon: Darlin Coco, MD;  Location[facility] ENDOSCOPY;  Service: Cardiovascular;  Laterality: N/A;  . CARDIOVERSION N/A 04/13/2015   Procedure: CARDIOVERSION;  Surgeon: Thayer Headings, MD;  Location: [place];  Service: Cardiovascular;  Laterality: N/A;  . CARDIOVERSION N/A 01/08/2017   Procedure: CARDIOVERSION;  Surgeon: Sanda Klein, MD;  Location: [place] ENDOSCOPY;  Service: Cardiovascular;  Laterality: N/A;  . CARDIOVERSION N/A 09/19/2017   Procedure: CARDIOVERSION;  Surgeon: [name], MD;  Location: [facility] ENDOSCOPY;  Service: Cardiovascular;  Laterality: N/A;  . CARDIOVERSION N/A 10/25/2017   Procedure: CARDIOVERSION;  Surgeon: Thayer Headings, MD;  Location[facility] END[facility]COPY;  Service: Cardiovascular;  Laterality: N/A;  . CARDIOVERSION N/A 12/27/2018   Procedure: CARDIOVERSION;  Surgeon: [name], MD;  Location: [facility] ENDOSCOPY;  Service: Cardiovascular;  Laterality: N/A;  . CARDIOVERSION N/A 05/20/2019   Procedure: CARDIOVERSION;  Surgeon: [name], MD;  Location: [facility] ENDOSCOPY;  Service: Cardiovascular;  Laterality: N/A;  . CARPAL TUNNEL RELEASE Right   . CATARACT EXTRACTION W/ INTRAOCULAR LENS  IMPLANT, BILATERAL Bilateral   . COLONOSCOPY    . EXCISIONAL HEMORRHOIDECTOMY    . INSERTION OF MESH N/A 05/25/2017   Procedure: INSERTION OF MESH;  Surgeon: Ralene Ok, MD;  Location: [facility];  Service: General;  Laterality: N/A;  . TEE WITHOUT CARDIOVERSION N/A 03/30/2014   Procedure: TRANSESOPHAGEAL ECHOCARDIOGRAM (TEE);  Surgeon: [name], MD;  Location: [facility] ENDOSCOPY;  Service: Cardiovascular;  Laterality: N/A;  . THYROIDECTOMY    . TONSILLECTOMY     Social History   Occupational History  . Occupation: Retired    Comment: Education officer, [employer]  Tobacco Use  . Smoking status: Never Smoker  . Smokeless tobacco: Never Used  Substance and Sexual Activity  . Alcohol use: No    Alcohol/week: 0.0 standard drinks    . Drug use: No  . Sexual activity: Never

## 2019-11-10 ENCOUNTER — Telehealth: Payer: Self-pay | Admitting: Cardiovascular Disease

## 2019-11-10 NOTE — Telephone Encounter (Signed)
I called [facility] and they just needed clarification on the Rx that was sent in.

## 2019-11-10 NOTE — Telephone Encounter (Signed)
New Message:      Please call them back, concerning formula on pt's Potassium.

## 2019-11-25 ENCOUNTER — Other Ambulatory Visit: Payer: Self-pay

## 2019-11-25 ENCOUNTER — Ambulatory Visit (HOSPITAL_COMMUNITY)
Admission: RE | Admit: 2019-11-25 | Discharge: 2019-11-25 | Disposition: A | Discharge status: Home / Self Care | Payer: Medicare PPO | Source: Ambulatory Visit | Attending: Nurse Practitioner | Admitting: Nurse Practitioner

## 2019-11-25 VITALS — BP 124/80 | HR 110 | Ht 66.0 in | Wt 180.6 lb

## 2019-11-25 DIAGNOSIS — E119 Type 2 diabetes mellitus without complications: Secondary | ICD-10-CM | POA: Diagnosis not present

## 2019-11-25 DIAGNOSIS — G4733 Obstructive sleep apnea (adult) (pediatric): Secondary | ICD-10-CM | POA: Diagnosis not present

## 2019-11-25 DIAGNOSIS — K5730 Diverticulosis of large intestine without perforation or abscess without bleeding: Secondary | ICD-10-CM | POA: Insufficient documentation

## 2019-11-25 DIAGNOSIS — Z853 Personal history of malignant neoplasm of breast: Secondary | ICD-10-CM | POA: Diagnosis not present

## 2019-11-25 DIAGNOSIS — Z86010 Personal history of colonic polyps: Secondary | ICD-10-CM | POA: Insufficient documentation

## 2019-11-25 DIAGNOSIS — D6869 Other thrombophilia: Secondary | ICD-10-CM

## 2019-11-25 DIAGNOSIS — I420 Dilated cardiomyopathy: Secondary | ICD-10-CM | POA: Diagnosis not present

## 2019-11-25 DIAGNOSIS — E890 Postprocedural hypothyroidism: Secondary | ICD-10-CM | POA: Insufficient documentation

## 2019-11-25 DIAGNOSIS — Z79890 Hormone replacement therapy (postmenopausal): Secondary | ICD-10-CM | POA: Diagnosis not present

## 2019-11-25 DIAGNOSIS — I4819 Other persistent atrial fibrillation: Secondary | ICD-10-CM | POA: Insufficient documentation

## 2019-11-25 DIAGNOSIS — H409 Unspecified glaucoma: Secondary | ICD-10-CM | POA: Insufficient documentation

## 2019-11-25 DIAGNOSIS — Z8541 Personal history of malignant neoplasm of cervix uteri: Secondary | ICD-10-CM | POA: Insufficient documentation

## 2019-11-25 DIAGNOSIS — I4892 Unspecified atrial flutter: Secondary | ICD-10-CM | POA: Insufficient documentation

## 2019-11-25 DIAGNOSIS — Z7901 Long term (current) use of anticoagulants: Secondary | ICD-10-CM | POA: Insufficient documentation

## 2019-11-25 DIAGNOSIS — Z85850 Personal history of malignant neoplasm of thyroid: Secondary | ICD-10-CM | POA: Insufficient documentation

## 2019-11-25 DIAGNOSIS — K589 Irritable bowel syndrome without diarrhea: Secondary | ICD-10-CM | POA: Diagnosis not present

## 2019-11-25 DIAGNOSIS — Z7984 Long term (current) use of oral hypoglycemic drugs: Secondary | ICD-10-CM | POA: Insufficient documentation

## 2019-11-25 DIAGNOSIS — I480 Paroxysmal atrial fibrillation: Secondary | ICD-10-CM | POA: Diagnosis not present

## 2019-11-25 DIAGNOSIS — K219 Gastro-esophageal reflux disease without esophagitis: Secondary | ICD-10-CM | POA: Diagnosis not present

## 2019-11-25 DIAGNOSIS — M1710 Unilateral primary osteoarthritis, unspecified knee: Secondary | ICD-10-CM | POA: Insufficient documentation

## 2019-11-25 DIAGNOSIS — E7800 Pure hypercholesterolemia, unspecified: Secondary | ICD-10-CM | POA: Diagnosis not present

## 2019-11-25 DIAGNOSIS — I10 Essential (primary) hypertension: Secondary | ICD-10-CM | POA: Diagnosis not present

## 2019-11-25 DIAGNOSIS — Z79899 Other long term (current) drug therapy: Secondary | ICD-10-CM | POA: Diagnosis not present

## 2019-11-25 MED ORDER — AMIODARONE HCL 200 MG PO TABS: 200.0000 mg | ORAL_TABLET | Freq: Two times a day (BID) | ORAL | 3 refills | Status: DC

## 2019-11-25 NOTE — Progress Notes (Signed)
Primary Care Physician: Lavone Orn, MD Cardiologist: [name] Pulmonologist: Dr. Elsworth Soho GI: Dr. Dolphus Jenny [name] is a 86 y.o. female with a h/o afib that has been on amiodarone since June of 2015. She has had many breakthrough events, last one in February with successful cardioversion. She does feel very weak in afib. She went into afib around 2 weeks ago and a RN came out to her house on Friday and documented the afib.  Dr. Rayann Heman was called and left instructions to increase amio to 200 mg bid and stop losartan. Apparently her BP was low on that day.  She is in now in the afib clinc and she is rate controlled and BP is stable. She reports around 2-3 weeks ago she first had 2 teeth pulled and then a shot of cortisone in her knee. She hasn't felt well since. She usually requires a cardioversion to restore SR.    F/u in afib clinic 7/15 after successful cardioversion. She is in SR today and feels improved. She is back to Amio 200 mg daily.  F/u in afib clinic, 11/25/19, with feelings of not being well and being out of rhythm x one week. She has had an UTI and just finished a round of doxycycline. EKG shows atrial flutter  variable rate at 110 bpm.CHA2DS2-VASc Score = 6, continues eliquis 5 mg bid. [id number]   Today, she denies symptoms of chest pain, shortness of breath, orthopnea, PND, lower extremity edema, dizziness, presyncope, syncope, or neurologic sequela.Perisistent cough. The patient is tolerating medications without difficulties and is otherwise without complaint today.   Past Medical History:  Diagnosis Date  . Acute bronchitis 11/15/2017  . ADENOCARCINOMA, BREAST 10/13/2010   Qualifier: Diagnosis of  By: Nils Pyle CMA (AAMA), Mearl Latin    . 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 Allergy    SEASONAL  . Anemia    years ago  . ANEMIA, IRON DEFICIENCY 10/14/2010   Qualifier: Diagnosis of  By: Sharlett Iles MD Byrd Hesselbach   . Anemia, unspecified 10/13/2010   Qualifier: Diagnosis of  By: Nils Pyle CMA ([place]), Mearl Latin    . Anticoagulated by anticoagulation treatment - Eliquis 03/30/2014   Started May 2015   . Anxiety state 10/13/2010   Qualifier: Diagnosis of  By: Nils Pyle CMA ([place]), Mearl Latin    . Arthritis    "all my joints" (03/31/2014)  . Atrial fibrillation ([place])   . Atrial fibrillation with RVR ([place]) 03/31/2015   DLCO dropped from 100% in 2015-50% in 01/2017  . Barrett's esophagus   . Breast cancer [facility])    s/p right breast lumpectomy  . CARDIAC MURMUR 10/13/2010   Qualifier: Diagnosis of  By: Nils Pyle CMA ([place]), Mearl Latin    . 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 Cervical cancer ([place])   . Chronic cough 01/02/2017   GERD, hiatal hernia Doubt amio  . COLONIC POLYPS, ADENOMATOUS, HX OF 10/13/2010   Qualifier: Diagnosis of  By: Nils Pyle CMA (AAMA), Mearl Latin    . Degenerative disorder of eye 07/25/2012  . Diabetes mellitus without complication ([place])   . Dilated cardiomyopathy ([place]) 08/27/2018  . DIVERTICULOSIS, COLON 10/13/2010   Qualifier: Diagnosis of  By: Nils Pyle CMA (AAMA), Mearl Latin    . DJD (degenerative joint disease) of knee   . Dysrhythmia    Afib  . EROSIVE ESOPHAGITIS 10/13/2010   Qualifier: Diagnosis of  By: Nils Pyle CMA ([place]), Mearl Latin    . Error, refractive, myopia 07/09/2012  . Essential hypertension 10/13/2010   Qualifier: Diagnosis of  By: Nils Pyle CMA ([place]), Mearl Latin    . Exotropia, intermittent 07/09/2012  . GERD 10/14/2010   Qualifier: Diagnosis of  By: Sharlett Iles MD Byrd Hesselbach GERD (gastroesophageal reflux disease)   . Glaucoma   . H/O hiatal hernia   . Heart murmur   . Hiatal hernia s/p robotic repair & fundoplication [id number]   Qualifier: Diagnosis of  By: Nils Pyle CMA (AAMA), Mearl Latin    . History of laser assisted in situ keratomileusis 07/25/2012  . Hx of transesophageal echocardiography (TEE) for monitoring    TEE (03/2014): No LAA clot, moderate LAE, core triatriatum type structure in RA with no stenosis, normal EF 55-60%, mild  MR  . HYPERCHOLESTEROLEMIA 10/13/2010   Qualifier: Diagnosis of  By: Nils Pyle CMA ([place]), Mearl Latin    . Hypertension   . Hypothyroidism, postsurgical   . Irritable bowel syndrome 10/13/2010   Qualifier: Diagnosis of  By: Nils Pyle CMA (AAMA), Mearl Latin    . Lumbar stenosis with neurogenic claudication 04/20/2016  . Malignant neoplasm of breast ([place]) 02/25/2016  . Migraine    "last one was years ago" (03/31/2014)  . MIGRAINE HEADACHE 10/13/2010   Qualifier: Diagnosis of  By: Nils Pyle CMA ([place]), Mearl Latin    . 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 OSA on CPAP    settings at 10-11   . OSTEOARTHRITIS 10/13/2010   Qualifier: Diagnosis of  By: Nils Pyle CMA ([place]), Mearl Latin    . Post-surgical hypothyroidism 04/09/2014  . RECTAL FISSURE 10/13/2010   Qualifier: Diagnosis of  By: Nils Pyle CMA ([place]), Mearl Latin    . S/P Nissen fundoplication (without gastrostomy tube) procedure 05/25/2017  . Sleep apnea   . THYROID CANCER 10/13/2010   Qualifier: Diagnosis of  By: Nils Pyle CMA ([place]), Mearl Latin    . Thyroid cancer [facility])    s/p thyroidectomy  . Type II diabetes mellitus ([place])    type II    Past Surgical History:  Procedure Laterality Date  . ABDOMINAL HYSTERECTOMY     "partial"  . BREAST BIOPSY Right   . BREAST LUMPECTOMY Right   . CARDIOVERSION N/A 03/30/2014   Procedure: CARDIOVERSION - BEDSIDE;  Surgeon: [name], MD;  Location: [place];  Service: Cardiovascular;  Laterality: N/A;  . CARDIOVERSION N/A 03/31/2014   Procedure: CARDIOVERSION  (BEDSIDE) ;  Surgeon: Lelon Perla, MD;  Location: [place];  Service: Cardiovascular;  Laterality: N/A;  . CARDIOVERSION N/A 04/27/2014   Procedure: CARDIOVERSION;  Surgeon: Darlin Coco, MD;  Location: [place];  Service: Cardiovascular;  Laterality: N/A;  . CARDIOVERSION N/A 04/13/2015   Procedure: CARDIOVERSION;  Surgeon: Thayer Headings, MD;  Location: [place];  Service: Cardiovascular;  Laterality: N/A;  . CARDIOVERSION N/A 01/08/2017   Procedure:  CARDIOVERSION;  Surgeon: Sanda Klein, MD;  Location: [facility] ENDOSCOPY;  Service: Cardiovascular;  Laterality: N/A;  . CARDIOVERSION N/A 09/19/2017   Procedure: CARDIOVERSION;  Surgeon: [name], MD;  Location: [facility] ENDOSCOPY;  Service: Cardiovascular;  Laterality: N/A;  . CARDIOVERSION N/A 10/25/2017   Procedure: CARDIOVERSION;  Surgeon: Acie Fredrickson Wonda Cheng, MD;  Location: [facility] ENDOSCOPY;  Service: Cardiovascular;  Laterality: N/A;  . CARDIOVERSION N/A 12/27/2018   Procedure: CARDIOVERSION;  Surgeon: [name], MD;  Location: [facility] END[facility]COPY;  Service: Cardiovascular;  Laterality: N/A;  . CARDIOVERSION N/A 05/20/2019   Procedure: CARDIOVERSION;  Surgeon: [name], MD;  Location: [facility] ENDOSCOPY;  Service: Cardiovascular;  Laterality: N/A;  . CARPAL TUNNEL RELEASE Right   . CATARACT EXTRACTION W/  INTRAOCULAR LENS  IMPLANT, BILATERAL Bilateral   . COLONOSCOPY    . EXCISIONAL HEMORRHOIDECTOMY    . INSERTION OF MESH N/A 05/25/2017   Procedure: INSERTION OF MESH;  Surgeon: Ralene Ok, MD;  Location: [facility];  Service: General;  Laterality: N/A;  . TEE WITHOUT CARDIOVERSION N/A 03/30/2014   Procedure: TRANSESOPHAGEAL ECHOCARDIOGRAM (TEE);  Surgeon: [name], MD;  Location: [facility] ENDOSCOPY;  Service: Cardiovascular;  Laterality: N/A;  . THYROIDECTOMY    . TONSILLECTOMY      Current Outpatient Medications  Medication Sig Dispense Refill  . acetaminophen (TYLENOL) 650 MG CR tablet Take 650 mg by mouth every 8 (eight) hours.     Marland Kitchen amiodarone (PACERONE) 200 MG tablet Take 1 tablet (200 mg total) by mouth 2 (two) times daily. 90 tablet 3  . bimatoprost (LUMIGAN) 0.03 % ophthalmic solution Place 1 drop into both eyes nightly.    . calcium carbonate (OSCAL) 1500 (600 Ca) MG TABS tablet Take 600 mg of elemental calcium by mouth every other day. At night    . cetirizine (ZYRTEC) 10 MG tablet Take 10 mg by mouth daily.     . chlorhexidine (PERIDEX) 0.12 % solution 5 mLs by Mouth Rinse route  daily.    . cholecalciferol (VITAMIN D3) 25 MCG (1000 UT) tablet Take 5,000 Units by mouth daily.     Marland Kitchen ELIQUIS 5 MG TABS tablet TAKE 1 TABLET BY MOUTH TWO  TIMES DAILY 180 tablet 2  . ferrous sulfate 325 (65 FE) MG tablet Take 325 mg by mouth daily with breakfast.    . fluticasone (FLONASE) 50 MCG/ACT nasal spray Place 1 spray into both nostrils daily as needed for allergies.     Marland Kitchen lansoprazole (PREVACID) 30 MG capsule Take 30 mg by mouth daily.     Marland Kitchen levothyroxine (SYNTHROID) 150 MCG tablet Take 150 mcg by mouth daily.    Marland Kitchen LORazepam (ATIVAN) 1 MG tablet Take 1.5 mg by mouth at bedtime.     . metFORMIN (GLUCOPHAGE-XR) 500 MG 24 hr tablet Take 500 mg by mouth 2 (two) times daily.     . Multiple Vitamins-Minerals (ICAPS AREDS 2) CAPS Take 1 capsule by mouth daily.     . Multiple Vitamins-Minerals (ZINC PO) Take 20 mg by mouth once a week.    . Polyvinyl Alcohol-Povidone PF (REFRESH) 1.4-0.6 % SOLN Place 1 drop into both eyes daily as needed (dry eyes).     . potassium chloride SA (KLOR-CON) 20 MEQ tablet Take 1 tablet (20 mEq total) by mouth daily. 90 tablet 3  . rosuvastatin (CRESTOR) 10 MG tablet Take 10 mg by mouth at bedtime.    . sertraline (ZOLOFT) 50 MG tablet Take 50 mg by mouth daily.    . vitamin B-12 (CYANOCOBALAMIN) 1000 MCG tablet Take 1,000 mcg by mouth as needed (energy).     . metoprolol tartrate (LOPRESSOR) 25 MG tablet Take 1 tablet (25 mg total) by mouth 2 (two) times daily. 180 tablet 1   No current facility-administered medications for this encounter.    Allergies  Allergen Reactions  . Penicillins Itching, Rash and Other (See Comments)    Did it involve swelling of the face/tongue/throat, SOB, or low BP? No Did it involve sudden or severe rash/hives, skin peeling, or any reaction on the inside of your mouth or nose? No Did you need to seek medical attention at a hospital or doctor's office? No When did it last happen?30+ years If all above answers are "NO", may  proceed with cephalosporin use.     Social History   Socioeconomic History  . Marital status: Divorced    Spouse name: Not on file  . Number of children: 2  . Years of education: Not on file  . Highest education level: Not on file  Occupational History  . Occupation: Retired    Comment: Education officer, museum  Tobacco Use  . Smoking status: Never Smoker  . Smokeless tobacco: Never Used  Substance and Sexual Activity  . Alcohol use: No    Alcohol/week: 0.0 standard drinks  . Drug use: No  . Sexual activity: Never  Other Topics Concern  . Not on file  Social History Narrative  . Not on file   Social Determinants of Health   Financial Resource Strain:   . Difficulty of Paying Living Expenses: Not on file  Food Insecurity:   . Worried About Charity fundraiser in the Last Year: Not on file  . Ran Out of Food in the Last Year: Not on file  Transportation Needs:   . Lack of Transportation (Medical): Not on file  . Lack of Transportation (Non-Medical): Not on file  Physical Activity:   . Days of Exercise per Week: Not on file  . Minutes of Exercise per Session: Not on file  Stress:   . Feeling of Stress : Not on file  Social Connections:   . Frequency of Communication with Friends and Family: Not on file  . Frequency of Social Gatherings with Friends and Family: Not on file  . Attends Religious Services: Not on file  . Active Member of Clubs or Organizations: Not on file  . Attends Archivist Meetings: Not on file  . Marital Status: Not on file  Intimate Partner Violence:   . Fear of Current or Ex-Partner: Not on file  . Emotionally Abused: Not on file  . Physically Abused: Not on file  . Sexually Abused: Not on file    Family History  Problem Relation Age of Onset  . Prostate cancer Father   . Diabetes Sister   . Heart disease Sister   . Heart attack Brother   . Heart disease Brother   . Diabetes Brother   . Diabetes Sister   . Diabetes Sister   .  Pancreatic cancer Sister   . Diabetes Brother   . Heart disease Brother   . Diabetes Brother   . Heart disease Brother   . Diabetes Brother   . Heart disease Brother   . Diabetes Brother   . Heart disease Brother   . Diabetes Brother   . Heart disease Brother   . Diabetes Brother   . Diabetes Child     ROS- All systems are reviewed and negative except as per the HPI above  Physical Exam: Vitals:   11/25/19 1145  BP: 124/80  Pulse: (!) 110  Weight: 81.9 kg  Height: 5\' 6"  (1.676 m)   Wt Readings from Last 3 Encounters:  11/25/19 81.9 kg  09/30/19 84.4 kg  05/28/19 85.3 kg    Labs: Lab Results  Component Value Date   NA 141 05/12/2019   K 4.1 05/12/2019   CL 110 05/12/2019   CO2 25 05/12/2019   GLUCOSE 150 (H) 05/12/2019   BUN 21 05/12/2019   CREATININE 0.94 05/12/2019   CALCIUM 9.0 05/12/2019   MG 2.2 06/07/2017   Lab Results  Component Value Date   INR 1.12 04/20/2016   No results found for: CHOL, HDL,  [place], TRIG   GEN- The patient is well appearing, alert and oriented x 3 today.   Head- normocephalic, atraumatic Eyes-  Sclera clear, conjunctiva pink Ears- hearing intact Oropharynx- clear Neck- supple, no JVP Lymph- no cervical lymphadenopathy Lungs- Clear to ausculation bilaterally, normal work of breathing Heart- irregular rate and rhythm, no murmurs, rubs or gallops, PMI not laterally displaced GI- soft, NT, ND, + BS Extremities- no clubbing, cyanosis, or edema MS- no significant deformity or atrophy Skin- no rash or lesion Psych- euthymic mood, full affect Neuro- strength and sensation are intact  EKG- atrial flutter at 110 bpm, qrs int 102 ms, qtc 505 ms Epic records reviewed   Assessment and Plan: 1. Persistent afib In atrial flutter at 110 bpm Continue eliquis 5 mg bid  CHA2DS2-VASc Score = 6  Increase amiodarone to 200 mg bid and metoprolol 25 mg bid   2. HTN Stable  F/u in afib clinic in one week   Butch Penny C. Sharvil Hoey,  [facility] [address] [place], [place] [address] [phone]

## 2019-11-25 NOTE — Patient Instructions (Signed)
Increase amiodarone to 200mg  twice a day  Increase metoprolol to 25mg  twice a day

## 2019-12-01 ENCOUNTER — Ambulatory Visit (HOSPITAL_COMMUNITY): Payer: Medicare PPO | Admitting: Nurse Practitioner

## 2019-12-02 ENCOUNTER — Encounter (HOSPITAL_COMMUNITY): Payer: Self-pay | Admitting: Nurse Practitioner

## 2019-12-02 NOTE — Addendum Note (Signed)
Encounter addended by: Sherran Needs, NP on: 12/02/2019 8:48 AM  Actions taken: Flowsheet accepted, Problem List reviewed, Allergies reviewed, Medication List reviewed, Level of Service modified

## 2019-12-03 ENCOUNTER — Ambulatory Visit (HOSPITAL_COMMUNITY)
Admission: RE | Admit: 2019-12-03 | Discharge: 2019-12-03 | Disposition: A | Discharge status: Home / Self Care | Payer: Medicare PPO | Source: Ambulatory Visit | Attending: Nurse Practitioner | Admitting: Nurse Practitioner

## 2019-12-03 ENCOUNTER — Encounter (HOSPITAL_COMMUNITY): Payer: Self-pay | Admitting: Nurse Practitioner

## 2019-12-03 ENCOUNTER — Other Ambulatory Visit: Payer: Self-pay

## 2019-12-03 VITALS — BP 110/80 | HR 87 | Ht 66.0 in | Wt 185.0 lb

## 2019-12-03 DIAGNOSIS — Z8042 Family history of malignant neoplasm of prostate: Secondary | ICD-10-CM | POA: Insufficient documentation

## 2019-12-03 DIAGNOSIS — Z7984 Long term (current) use of oral hypoglycemic drugs: Secondary | ICD-10-CM | POA: Diagnosis not present

## 2019-12-03 DIAGNOSIS — E7800 Pure hypercholesterolemia, unspecified: Secondary | ICD-10-CM | POA: Insufficient documentation

## 2019-12-03 DIAGNOSIS — M1389 Other specified arthritis, multiple sites: Secondary | ICD-10-CM | POA: Insufficient documentation

## 2019-12-03 DIAGNOSIS — D509 Iron deficiency anemia, unspecified: Secondary | ICD-10-CM | POA: Diagnosis not present

## 2019-12-03 DIAGNOSIS — Z853 Personal history of malignant neoplasm of breast: Secondary | ICD-10-CM | POA: Insufficient documentation

## 2019-12-03 DIAGNOSIS — Z79899 Other long term (current) drug therapy: Secondary | ICD-10-CM | POA: Diagnosis not present

## 2019-12-03 DIAGNOSIS — I4430 Unspecified atrioventricular block: Secondary | ICD-10-CM | POA: Insufficient documentation

## 2019-12-03 DIAGNOSIS — Z7901 Long term (current) use of anticoagulants: Secondary | ICD-10-CM | POA: Insufficient documentation

## 2019-12-03 DIAGNOSIS — K219 Gastro-esophageal reflux disease without esophagitis: Secondary | ICD-10-CM | POA: Diagnosis not present

## 2019-12-03 DIAGNOSIS — Z880 Allergy status to penicillin: Secondary | ICD-10-CM | POA: Insufficient documentation

## 2019-12-03 DIAGNOSIS — F411 Generalized anxiety disorder: Secondary | ICD-10-CM | POA: Diagnosis not present

## 2019-12-03 DIAGNOSIS — I10 Essential (primary) hypertension: Secondary | ICD-10-CM | POA: Diagnosis not present

## 2019-12-03 DIAGNOSIS — Z90710 Acquired absence of both cervix and uterus: Secondary | ICD-10-CM | POA: Diagnosis not present

## 2019-12-03 DIAGNOSIS — Z85850 Personal history of malignant neoplasm of thyroid: Secondary | ICD-10-CM | POA: Diagnosis not present

## 2019-12-03 DIAGNOSIS — I4819 Other persistent atrial fibrillation: Secondary | ICD-10-CM | POA: Diagnosis present

## 2019-12-03 DIAGNOSIS — E119 Type 2 diabetes mellitus without complications: Secondary | ICD-10-CM | POA: Diagnosis not present

## 2019-12-03 DIAGNOSIS — G4733 Obstructive sleep apnea (adult) (pediatric): Secondary | ICD-10-CM | POA: Insufficient documentation

## 2019-12-03 DIAGNOSIS — I480 Paroxysmal atrial fibrillation: Secondary | ICD-10-CM | POA: Diagnosis not present

## 2019-12-03 DIAGNOSIS — Z8541 Personal history of malignant neoplasm of cervix uteri: Secondary | ICD-10-CM | POA: Insufficient documentation

## 2019-12-03 DIAGNOSIS — Z800 Family history of malignant neoplasm of digestive organs: Secondary | ICD-10-CM | POA: Insufficient documentation

## 2019-12-03 DIAGNOSIS — Z833 Family history of diabetes mellitus: Secondary | ICD-10-CM | POA: Diagnosis not present

## 2019-12-03 DIAGNOSIS — E890 Postprocedural hypothyroidism: Secondary | ICD-10-CM | POA: Insufficient documentation

## 2019-12-03 DIAGNOSIS — Z8249 Family history of ischemic heart disease and other diseases of the circulatory system: Secondary | ICD-10-CM | POA: Insufficient documentation

## 2019-12-03 DIAGNOSIS — I4892 Unspecified atrial flutter: Secondary | ICD-10-CM | POA: Insufficient documentation

## 2019-12-03 DIAGNOSIS — I420 Dilated cardiomyopathy: Secondary | ICD-10-CM | POA: Insufficient documentation

## 2019-12-03 DIAGNOSIS — D6869 Other thrombophilia: Secondary | ICD-10-CM | POA: Diagnosis not present

## 2019-12-03 DIAGNOSIS — H409 Unspecified glaucoma: Secondary | ICD-10-CM | POA: Diagnosis not present

## 2019-12-03 DIAGNOSIS — R9431 Abnormal electrocardiogram [ECG] [EKG]: Secondary | ICD-10-CM | POA: Insufficient documentation

## 2019-12-03 DIAGNOSIS — Z9011 Acquired absence of right breast and nipple: Secondary | ICD-10-CM | POA: Insufficient documentation

## 2019-12-03 LAB — CBC
HCT: 42 % (ref 36.0–46.0)
Hemoglobin: 13.8 g/dL (ref 12.0–15.0)
MCH: 32.4 pg (ref 26.0–34.0)
MCHC: 32.9 g/dL (ref 30.0–36.0)
MCV: 98.6 fL (ref 80.0–100.0)
Platelets: 166 10*3/uL (ref 150–400)
RBC: 4.26 MIL/uL (ref 3.87–5.11)
RDW: 12.6 % (ref 11.5–15.5)
WBC: 4.6 10*3/uL (ref 4.0–10.5)
nRBC: 0 % (ref 0.0–0.2)

## 2019-12-03 LAB — BASIC METABOLIC PANEL
Anion gap: 12 (ref 5–15)
BUN: 18 mg/dL (ref 8–23)
CO2: 21 mmol/L — ABNORMAL LOW (ref 22–32)
Calcium: 9.1 mg/dL (ref 8.9–10.3)
Chloride: 106 mmol/L (ref 98–111)
Creatinine, Ser: 1.11 mg/dL — ABNORMAL HIGH (ref 0.44–1.00)
GFR calc Af Amer: 52 mL/min — ABNORMAL LOW (ref >60)
GFR calc non Af Amer: 45 mL/min — ABNORMAL LOW (ref >60)
Glucose, Bld: 120 mg/dL — ABNORMAL HIGH (ref 70–99)
Potassium: 4.4 mmol/L (ref 3.5–5.1)
Sodium: 139 mmol/L (ref 135–145)

## 2019-12-03 MED ORDER — METOPROLOL TARTRATE 25 MG PO TABS: 12.5000 mg | ORAL_TABLET | Freq: Two times a day (BID) | ORAL | 1 refills | Status: DC

## 2019-12-03 NOTE — Progress Notes (Signed)
Primary Care Physician: Lavone Orn, MD Cardiologist: [name] Pulmonologist: Dr. Elsworth Soho GI: Dr. Dolphus Jenny I Gamel is a 86 y.o. female with a h/o afib that has been on amiodarone since June of 2015. She has had many breakthrough events, last one in February with successful cardioversion. She does feel very weak in afib. She went into afib around 2 weeks ago and a RN came out to her house on Friday and documented the afib.  Dr. Rayann Heman was called and left instructions to increase amio to 200 mg bid and stop losartan. Apparently her BP was low on that day.  She is in now in the afib clinc and she is rate controlled and BP is stable. She reports around 2-3 weeks ago she first had 2 teeth pulled and then a shot of cortisone in her knee. She hasn't felt well since. She usually requires a cardioversion to restore SR.    F/u in afib clinic 7/15 after successful cardioversion. She is in SR today and feels improved. She is back to Amio 200 mg daily.  F/u in afib clinic, 11/25/19, with feelings of not being well and being out of rhythm x one week. She has had an UTI and just finished a round of doxycycline. EKG shows atrial flutter  variable rate at 110 bpm.CHA2DS2-VASc Score = 6, continues eliquis 5 mg bid. 1 F/u  03 [id number]}   Today, she denies symptoms of chest pain, shortness of breath, orthopnea, PND, lower extremity edema, dizziness, presyncope, syncope, or neurologic sequela.Perisistent cough. The patient is tolerating medications without difficulties and is otherwise without complaint today.   Past Medical History:  Diagnosis Date  . Acute bronchitis 11/15/2017  . ADENOCARCINOMA, BREAST 10/13/2010   Qualifier: Diagnosis of  By: Nils Pyle CMA (AAMA), [name]    . 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 Allergy    SEASONAL  . Anemia    years ago  . ANEMIA, IRON DEFICIENCY 10/14/2010   Qualifier: Diagnosis of  By: Sharlett Iles MD Byrd Hesselbach   . Anemia, unspecified 10/13/2010     Qualifier: Diagnosis of  By: Nils Pyle CMA ([place]), [name]    . Anticoagulated by anticoagulation treatment - Eliquis 03/30/2014   Started May 2015   . Anxiety state 10/13/2010   Qualifier: Diagnosis of  By: Nils Pyle CMA ([place]), [name]    . Arthritis    "all my joints" (03/31/2014)  . Atrial fibrillation ([place])   . Atrial fibrillation with RVR ([place]) 03/31/2015   DLCO dropped from 100% in 2015-50% in 01/2017  . Barrett's esophagus   . Breast cancer [facility])    s/p right breast lumpectomy  . CARDIAC MURMUR 10/13/2010   Qualifier: Diagnosis of  By: Nils Pyle CMA ([place]), [name]    . 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 Cervical cancer ([place])   . Chronic cough 01/02/2017   GERD, hiatal hernia Doubt amio  . COLONIC POLYPS, ADENOMATOUS, HX OF 10/13/2010   Qualifier: Diagnosis of  By: Nils Pyle CMA (AAMA), [name]    . Degenerative disorder of eye 07/25/2012  . Diabetes mellitus without complication ([place])   . Dilated cardiomyopathy ([place]) 08/27/2018  . DIVERTICULOSIS, COLON 10/13/2010   Qualifier: Diagnosis of  By: Nils Pyle CMA (AAMA), [name]    . DJD (degenerative joint disease) of knee   . Dysrhythmia    Afib  . EROSIVE ESOPHAGITIS 10/13/2010   Qualifier: Diagnosis of  By: Nils Pyle CMA ([place]), [name]    . Error, refractive, myopia 07/09/2012  . Essential hypertension  10/13/2010   Qualifier: Diagnosis of  By: Nils Pyle CMA ([place]), [name]    . Exotropia, intermittent 07/09/2012  . GERD 10/14/2010   Qualifier: Diagnosis of  By: Sharlett Iles MD Byrd Hesselbach GERD (gastroesophageal reflux disease)   . Glaucoma   . H/O hiatal hernia   . Heart murmur   . Hiatal hernia s/p robotic repair & fundoplication [id number]   Qualifier: Diagnosis of  By: Nils Pyle CMA (AAMA), [name]    . History of laser assisted in situ keratomileusis 07/25/2012  . Hx of transesophageal echocardiography (TEE) for monitoring    TEE (03/2014): No LAA clot, moderate LAE, core triatriatum type structure in RA with no stenosis, normal EF 55-60%,  mild MR  . HYPERCHOLESTEROLEMIA 10/13/2010   Qualifier: Diagnosis of  By: Nils Pyle CMA ([place]), [name]    . Hypertension   . Hypothyroidism, postsurgical   . Irritable bowel syndrome 10/13/2010   Qualifier: Diagnosis of  By: Nils Pyle CMA (AAMA), [name]    . Lumbar stenosis with neurogenic claudication 04/20/2016  . Malignant neoplasm of breast ([place]) 02/25/2016  . Migraine    "last one was years ago" (03/31/2014)  . MIGRAINE HEADACHE 10/13/2010   Qualifier: Diagnosis of  By: Nils Pyle CMA ([place]), [name]    . 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 OSA on CPAP    settings at 10-11   . OSTEOARTHRITIS 10/13/2010   Qualifier: Diagnosis of  By: Nils Pyle CMA ([place]), [name]    . Post-surgical hypothyroidism 04/09/2014  . RECTAL FISSURE 10/13/2010   Qualifier: Diagnosis of  By: Nils Pyle CMA ([place]), [name]    . S/P Nissen fundoplication (without gastrostomy tube) procedure 05/25/2017  . Sleep apnea   . THYROID CANCER 10/13/2010   Qualifier: Diagnosis of  By: Nils Pyle CMA ([place]), [name]    . Thyroid cancer [facility])    s/p thyroidectomy  . Type II diabetes mellitus ([place])    type II    Past Surgical History:  Procedure Laterality Date  . ABDOMINAL HYSTERECTOMY     "partial"  . BREAST BIOPSY Right   . BREAST LUMPECTOMY Right   . CARDIOVERSION N/A 03/30/2014   Procedure: CARDIOVERSION - BEDSIDE;  Surgeon: [name], MD;  Location: [place];  Service: Cardiovascular;  Laterality: N/A;  . CARDIOVERSION N/A 03/31/2014   Procedure: CARDIOVERSION  (BEDSIDE) ;  Surgeon: Lelon Perla, MD;  Location: [place];  Service: Cardiovascular;  Laterality: N/A;  . CARDIOVERSION N/A 04/27/2014   Procedure: CARDIOVERSION;  Surgeon: Darlin Coco, MD;  Location: [place];  Service: Cardiovascular;  Laterality: N/A;  . CARDIOVERSION N/A 04/13/2015   Procedure: CARDIOVERSION;  Surgeon: Thayer Headings, MD;  Location: [place];  Service: Cardiovascular;  Laterality: N/A;  . CARDIOVERSION N/A 01/08/2017   Procedure:  CARDIOVERSION;  Surgeon: Sanda Klein, MD;  Location: [facility] ENDOSCOPY;  Service: Cardiovascular;  Laterality: N/A;  . CARDIOVERSION N/A 09/19/2017   Procedure: CARDIOVERSION;  Surgeon: [name], MD;  Location: [facility] ENDOSCOPY;  Service: Cardiovascular;  Laterality: N/A;  . CARDIOVERSION N/A 10/25/2017   Procedure: CARDIOVERSION;  Surgeon: Acie Fredrickson Wonda Cheng, MD;  Location: [facility] ENDOSCOPY;  Service: Cardiovascular;  Laterality: N/A;  . CARDIOVERSION N/A 12/27/2018   Procedure: CARDIOVERSION;  Surgeon: [name], MD;  Location: [facility] ENDOSCOPY;  Service: Cardiovascular;  Laterality: N/A;  . CARDIOVERSION N/A 05/20/2019   Procedure: CARDIOVERSION;  Surgeon: [name], MD;  Location: [place];  Service: Cardiovascular;  Laterality: N/A;  . CARPAL TUNNEL RELEASE  Right   . CATARACT EXTRACTION W/ INTRAOCULAR LENS  IMPLANT, BILATERAL Bilateral   . COLONOSCOPY    . EXCISIONAL HEMORRHOIDECTOMY    . INSERTION OF MESH N/A 05/25/2017   Procedure: INSERTION OF MESH;  Surgeon: Ralene Ok, MD;  Location: [facility];  Service: General;  Laterality: N/A;  . TEE WITHOUT CARDIOVERSION N/A 03/30/2014   Procedure: TRANSESOPHAGEAL ECHOCARDIOGRAM (TEE);  Surgeon: [name], MD;  Location: [facility] ENDOSCOPY;  Service: Cardiovascular;  Laterality: N/A;  . THYROIDECTOMY    . TONSILLECTOMY      Current Outpatient Medications  Medication Sig Dispense Refill  . acetaminophen (TYLENOL) 650 MG CR tablet Take 650 mg by mouth every 8 (eight) hours.     Marland Kitchen amiodarone (PACERONE) 200 MG tablet Take 1 tablet (200 mg total) by mouth 2 (two) times daily. 90 tablet 3  . bimatoprost (LUMIGAN) 0.03 % ophthalmic solution Place 1 drop into both eyes nightly.    . calcium carbonate (OSCAL) 1500 (600 Ca) MG TABS tablet Take 600 mg of elemental calcium by mouth every other day. At night    . cetirizine (ZYRTEC) 10 MG tablet Take 10 mg by mouth daily.     . cholecalciferol (VITAMIN D3) 25 MCG (1000 UT) tablet Take 5,000  Units by mouth daily.     Marland Kitchen ELIQUIS 5 MG TABS tablet TAKE 1 TABLET BY MOUTH TWO  TIMES DAILY 180 tablet 2  . ferrous sulfate 325 (65 FE) MG tablet Take 325 mg by mouth daily with breakfast.    . fluticasone (FLONASE) 50 MCG/ACT nasal spray Place 1 spray into both nostrils daily as needed for allergies.     Marland Kitchen lansoprazole (PREVACID) 30 MG capsule Take 30 mg by mouth daily.     Marland Kitchen levothyroxine (SYNTHROID) 137 MCG tablet daily.     Marland Kitchen LORazepam (ATIVAN) 1 MG tablet Take 1.5 mg by mouth at bedtime.     . metFORMIN (GLUCOPHAGE-XR) 500 MG 24 hr tablet Take 500 mg by mouth 2 (two) times daily.     . metoprolol tartrate (LOPRESSOR) 25 MG tablet Take 1 tablet (25 mg total) by mouth 2 (two) times daily. 180 tablet 1  . Multiple Vitamins-Minerals (ICAPS AREDS 2) CAPS Take 1 capsule by mouth daily.     . Multiple Vitamins-Minerals (ZINC PO) Take 20 mg by mouth once a week.    . Polyvinyl Alcohol-Povidone PF (REFRESH) 1.4-0.6 % SOLN Place 1 drop into both eyes daily as needed (dry eyes).     . potassium chloride SA (KLOR-CON) 20 MEQ tablet Take 1 tablet (20 mEq total) by mouth daily. 90 tablet 3  . rosuvastatin (CRESTOR) 10 MG tablet Take 10 mg by mouth at bedtime.    . sertraline (ZOLOFT) 50 MG tablet Take 50 mg by mouth daily.    . vitamin B-12 (CYANOCOBALAMIN) 1000 MCG tablet Take 1,000 mcg by mouth as needed (energy).      No current facility-administered medications for this encounter.    Allergies  Allergen Reactions  . Penicillins Itching, Rash and Other (See Comments)    Did it involve swelling of the face/tongue/throat, SOB, or low BP? No Did it involve sudden or severe rash/hives, skin peeling, or any reaction on the inside of your mouth or nose? No Did you need to seek medical attention at a hospital or doctor's office? No When did it last happen?30+ years If all above answers are "NO", may proceed with cephalosporin use.     Social History   Socioeconomic  History  . Marital  status: Divorced    Spouse name: Not on file  . Number of children: 2  . Years of education: Not on file  . Highest education level: Not on file  Occupational History  . Occupation: Retired    Comment: Education officer, museum  Tobacco Use  . Smoking status: Never Smoker  . Smokeless tobacco: Never Used  Substance and Sexual Activity  . Alcohol use: No    Alcohol/week: 0.0 standard drinks  . Drug use: No  . Sexual activity: Never  Other Topics Concern  . Not on file  Social History Narrative  . Not on file   Social Determinants of Health   Financial Resource Strain:   . Difficulty of Paying Living Expenses: Not on file  Food Insecurity:   . Worried About Charity fundraiser in the Last Year: Not on file  . Ran Out of Food in the Last Year: Not on file  Transportation Needs:   . Lack of Transportation (Medical): Not on file  . Lack of Transportation (Non-Medical): Not on file  Physical Activity:   . Days of Exercise per Week: Not on file  . Minutes of Exercise per Session: Not on file  Stress:   . Feeling of Stress : Not on file  Social Connections:   . Frequency of Communication with Friends and Family: Not on file  . Frequency of Social Gatherings with Friends and Family: Not on file  . Attends Religious Services: Not on file  . Active Member of Clubs or Organizations: Not on file  . Attends Archivist Meetings: Not on file  . Marital Status: Not on file  Intimate Partner Violence:   . Fear of Current or Ex-Partner: Not on file  . Emotionally Abused: Not on file  . Physically Abused: Not on file  . Sexually Abused: Not on file    Family History  Problem Relation Age of Onset  . Prostate cancer Father   . Diabetes Sister   . Heart disease Sister   . Heart attack Brother   . Heart disease Brother   . Diabetes Brother   . Diabetes Sister   . Diabetes Sister   . Pancreatic cancer Sister   . Diabetes Brother   . Heart disease Brother   . Diabetes Brother     . Heart disease Brother   . Diabetes Brother   . Heart disease Brother   . Diabetes Brother   . Heart disease Brother   . Diabetes Brother   . Heart disease Brother   . Diabetes Brother   . Diabetes Child     ROS- All systems are reviewed and negative except as per the HPI above  Physical Exam: Vitals:   12/03/19 0925  Weight: 83.9 kg   Wt Readings from Last 3 Encounters:  12/03/19 83.9 kg  11/25/19 81.9 kg  09/30/19 84.4 kg    Labs: Lab Results  Component Value Date   NA 141 05/12/2019   K 4.1 05/12/2019   CL 110 05/12/2019   CO2 25 05/12/2019   GLUCOSE 150 (H) 05/12/2019   BUN 21 05/12/2019   CREATININE 0.94 05/12/2019   CALCIUM 9.0 05/12/2019   MG 2.2 06/07/2017   Lab Results  Component Value Date   INR 1.12 04/20/2016   No results found for: CHOL, HDL, LDLCALC, TRIG   GEN- The patient is well appearing, alert and oriented x 3 today.   Head- normocephalic, atraumatic Eyes-  Sclera  clear, conjunctiva pink Ears- hearing intact Oropharynx- clear Neck- supple, no JVP Lymph- no cervical lymphadenopathy Lungs- Clear to ausculation bilaterally, normal work of breathing Heart- irregular rate and rhythm, no murmurs, rubs or gallops, PMI not laterally displaced GI- soft, NT, ND, + BS Extremities- no clubbing, cyanosis, or edema MS- no significant deformity or atrophy Skin- no rash or lesion Psych- euthymic mood, full affect Neuro- strength and sensation are intact  EKG- atrial flutter at 110 bpm, qrs int 102 ms, qtc 505 ms Epic records reviewed   Assessment and Plan: 1. Persistent afib Remains in   atrial flutter despite extra amiodarone and metoprolol Will plan on DCCV Continue eliquis 5 mg bid  CHA2DS2-VASc Score =6 States no missed  eliquis doses  Continue  amiodarone to 200 mg bid until I see her back after DCCV and reduce  Metoprolol back to 12.5 mg bid  as she can be slow when converting back to SR Cbc/bmet/covid   2. HTN Stable  F/u in  afib clinic in one week after [place]. Saquan Furtick, [facility] [address] [place], [place] [address] [phone]

## 2019-12-03 NOTE — Patient Instructions (Signed)
Cardioversion scheduled for Wednesday, January 27th  - Arrive at the [facility] and go to admitting at 8:30AM  -Do not eat or drink anything after midnight the night prior to your procedure.  - Take all your morning medication with a sip of water prior to arrival.  - You will not be able to drive home after your procedure.   Decrease metoprolol to 12.5mg  twice a day  Continue amiodarone 200mg  twice a day

## 2019-12-06 ENCOUNTER — Other Ambulatory Visit (HOSPITAL_COMMUNITY)
Admission: RE | Admit: 2019-12-06 | Discharge: 2019-12-06 | Disposition: A | Discharge status: Home / Self Care | Payer: Medicare PPO | Source: Ambulatory Visit | Attending: Internal Medicine | Admitting: Internal Medicine

## 2019-12-06 DIAGNOSIS — Z01812 Encounter for preprocedural laboratory examination: Secondary | ICD-10-CM | POA: Insufficient documentation

## 2019-12-06 DIAGNOSIS — Z20822 Contact with and (suspected) exposure to covid-19: Secondary | ICD-10-CM | POA: Insufficient documentation

## 2019-12-06 LAB — SARS CORONAVIRUS 2 (TAT 6-24 HRS): SARS Coronavirus 2: NEGATIVE

## 2019-12-10 ENCOUNTER — Ambulatory Visit (HOSPITAL_COMMUNITY): Payer: Medicare PPO | Admitting: Critical Care Medicine

## 2019-12-10 ENCOUNTER — Ambulatory Visit (HOSPITAL_COMMUNITY)
Admission: RE | Admit: 2019-12-10 | Discharge: 2019-12-10 | Disposition: A | Discharge status: Home / Self Care | Payer: Medicare PPO | Attending: Internal Medicine | Admitting: Internal Medicine

## 2019-12-10 ENCOUNTER — Encounter (HOSPITAL_COMMUNITY): Payer: Self-pay | Admitting: Internal Medicine

## 2019-12-10 ENCOUNTER — Encounter (HOSPITAL_COMMUNITY)
Admission: RE | Disposition: A | Discharge status: Home / Self Care | Payer: Self-pay | Source: Home / Self Care | Attending: Internal Medicine

## 2019-12-10 ENCOUNTER — Other Ambulatory Visit: Payer: Self-pay

## 2019-12-10 DIAGNOSIS — Z79899 Other long term (current) drug therapy: Secondary | ICD-10-CM | POA: Diagnosis not present

## 2019-12-10 DIAGNOSIS — E890 Postprocedural hypothyroidism: Secondary | ICD-10-CM | POA: Diagnosis not present

## 2019-12-10 DIAGNOSIS — Z79890 Hormone replacement therapy (postmenopausal): Secondary | ICD-10-CM | POA: Diagnosis not present

## 2019-12-10 DIAGNOSIS — Z833 Family history of diabetes mellitus: Secondary | ICD-10-CM | POA: Diagnosis not present

## 2019-12-10 DIAGNOSIS — I420 Dilated cardiomyopathy: Secondary | ICD-10-CM | POA: Insufficient documentation

## 2019-12-10 DIAGNOSIS — Z7901 Long term (current) use of anticoagulants: Secondary | ICD-10-CM | POA: Diagnosis not present

## 2019-12-10 DIAGNOSIS — G4733 Obstructive sleep apnea (adult) (pediatric): Secondary | ICD-10-CM | POA: Diagnosis not present

## 2019-12-10 DIAGNOSIS — E7800 Pure hypercholesterolemia, unspecified: Secondary | ICD-10-CM | POA: Insufficient documentation

## 2019-12-10 DIAGNOSIS — Z7984 Long term (current) use of oral hypoglycemic drugs: Secondary | ICD-10-CM | POA: Diagnosis not present

## 2019-12-10 DIAGNOSIS — Z880 Allergy status to penicillin: Secondary | ICD-10-CM | POA: Insufficient documentation

## 2019-12-10 DIAGNOSIS — I10 Essential (primary) hypertension: Secondary | ICD-10-CM | POA: Insufficient documentation

## 2019-12-10 DIAGNOSIS — M1990 Unspecified osteoarthritis, unspecified site: Secondary | ICD-10-CM | POA: Insufficient documentation

## 2019-12-10 DIAGNOSIS — I4819 Other persistent atrial fibrillation: Secondary | ICD-10-CM | POA: Diagnosis not present

## 2019-12-10 DIAGNOSIS — E119 Type 2 diabetes mellitus without complications: Secondary | ICD-10-CM | POA: Insufficient documentation

## 2019-12-10 DIAGNOSIS — Z8249 Family history of ischemic heart disease and other diseases of the circulatory system: Secondary | ICD-10-CM | POA: Insufficient documentation

## 2019-12-10 HISTORY — PX: CARDIOVERSION: SHX1299

## 2019-12-10 LAB — GLUCOSE, CAPILLARY: Glucose-Capillary: 97 mg/dL (ref 70–99)

## 2019-12-10 SURGERY — CARDIOVERSION
Anesthesia: General

## 2019-12-10 MED ORDER — PROPOFOL 10 MG/ML IV BOLUS
INTRAVENOUS | Status: DC | PRN
  Administered 2019-12-10: 40 mg via INTRAVENOUS

## 2019-12-10 MED ORDER — SODIUM CHLORIDE 0.9 % IV SOLN: INTRAVENOUS | Status: DC

## 2019-12-10 NOTE — Discharge Instructions (Signed)
Electrical Cardioversion Electrical cardioversion is the delivery of a jolt of electricity to restore a normal rhythm to the heart. A rhythm that is too fast or is not regular keeps the heart from pumping well. In this procedure, sticky patches or metal paddles are placed on the chest to deliver electricity to the heart from a device. This procedure may be done in an emergency if:  There is low or no blood pressure as a result of the heart rhythm.  Normal rhythm must be restored as fast as possible to protect the brain and heart from further damage.  It may save a life. This may also be a scheduled procedure for irregular or fast heart rhythms that are not immediately life-threatening. Tell a health care provider about:  Any allergies you have.  All medicines you are taking, including vitamins, herbs, eye drops, creams, and over-the-counter medicines.  Any problems you or family members have had with anesthetic medicines.  Any blood disorders you have.  Any surgeries you have had.  Any medical conditions you have.  Whether you are pregnant or may be pregnant. What are the risks? Generally, this is a safe procedure. However, problems may occur, including:  Allergic reactions to medicines.  A blood clot that breaks free and travels to other parts of your body.  The possible return of an abnormal heart rhythm within hours or days after the procedure.  Your heart stopping (cardiac arrest). This is rare. What happens before the procedure? Medicines  Your health care provider may have you start taking: ? Blood-thinning medicines (anticoagulants) so your blood does not clot as easily. ? Medicines to help stabilize your heart rate and rhythm.  Ask your health care provider about: ? Changing or stopping your regular medicines. This is especially important if you are taking diabetes medicines or blood thinners. ? Taking medicines such as aspirin and ibuprofen. These medicines can  thin your blood. Do not take these medicines unless your health care provider tells you to take them. ? Taking over-the-counter medicines, vitamins, herbs, and supplements. General instructions  Follow instructions from your health care provider about eating or drinking restrictions.  Plan to have someone take you home from the hospital or clinic.  If you will be going home right after the procedure, plan to have someone with you for 24 hours.  Ask your health care provider what steps will be taken to help prevent infection. These may include washing your skin with a germ-killing soap. What happens during the procedure?   An IV will be inserted into one of your veins.  Sticky patches (electrodes) or metal paddles may be placed on your chest.  You will be given a medicine to help you relax (sedative).  An electrical shock will be delivered. The procedure may vary among health care providers and hospitals. What can I expect after the procedure?  Your blood pressure, heart rate, breathing rate, and blood oxygen level will be monitored until you leave the hospital or clinic.  Your heart rhythm will be watched to make sure it does not change.  You may have some redness on the skin where the shocks were given. Follow these instructions at home:  Do not drive for 24 hours if you were given a sedative during your procedure.  Take over-the-counter and prescription medicines only as told by your health care provider.  Ask your health care provider how to check your pulse. Check it often.  Rest for 48 hours after the procedure or   as told by your health care provider.  Avoid or limit your caffeine use as told by your health care provider.  Keep all follow-up visits as told by your health care provider. This is important. Contact a health care provider if:  You feel like your heart is beating too quickly or your pulse is not regular.  You have a serious muscle cramp that does not go  away. Get help right away if:  You have discomfort in your chest.  You are dizzy or you feel faint.  You have trouble breathing or you are short of breath.  Your speech is slurred.  You have trouble moving an arm or leg on one side of your body.  Your fingers or toes turn cold or blue. Summary  Electrical cardioversion is the delivery of a jolt of electricity to restore a normal rhythm to the heart.  This procedure may be done right away in an emergency or may be a scheduled procedure if the condition is not an emergency.  Generally, this is a safe procedure.  After the procedure, check your pulse often as told by your health care provider. This information is not intended to replace advice given to you by your health care provider. Make sure you discuss any questions you have with your health care provider. Document Revised: 06/02/2019 Document Reviewed: 06/02/2019 Elsevier Patient Education  2020 Elsevier Inc.  

## 2019-12-10 NOTE — H&P (Signed)
   INTERVAL PROCEDURE H&P  History and Physical Interval Note:  12/10/2019 8:11 AM  [name] has presented today for their planned procedure. The various methods of treatment have been discussed with the patient and family. After consideration of risks, benefits and other options for treatment, the patient has consented to the procedure.  The patients' outpatient history has been reviewed, patient examined, and no change in status from most recent office note within the past 30 days. I have reviewed the patients' chart and labs and will proceed as planned. Questions were answered to the patient's satisfaction.   Pixie Casino, MD, [facility], [place] Director of the Advanced Lipid Disorders &  Cardiovascular Risk Reduction Clinic Diplomate of the American Board of Clinical Lipidology Attending Cardiologist  Direct Dial: [phone]  Fax: [phone]  Website:  [email or web address] 12/10/2019, 8:11 AM

## 2019-12-10 NOTE — CV Procedure (Signed)
   CARDIOVERSION NOTE  Procedure: Electrical Cardioversion Indications:  Atrial Fibrillation  Procedure Details:  Consent: Risks of procedure as well as the alternatives and risks of each were explained to the (patient/caregiver).  Consent for procedure obtained.  Time Out: Verified patient identification, verified procedure, site/side was marked, verified correct patient position, special equipment/implants available, medications/allergies/relevent history reviewed, required imaging and test results available.  Performed  Patient placed on cardiac monitor, pulse oximetry, supplemental oxygen as necessary.  Sedation given: propofol per anesthesia Pacer pads placed anterior and posterior chest.  Cardioverted 1 time(s).  Cardioverted at 150J biphasic.  Impression: Findings: Post procedure EKG shows: NSR Complications: None Patient did tolerate procedure well.  Plan: 1. Successful DCCV with a single 150J biphasic shock.  Time Spent Directly with the Patient:  30 minutes   Pixie Casino, MD, [facility], [place] Director of the Advanced Lipid Disorders &  Cardiovascular Risk Reduction Clinic Diplomate of the American Board of Clinical Lipidology Attending Cardiologist  Direct Dial: [phone]  Fax: [phone]  Website:  [email or web address] 12/10/2019, 9:31 AM

## 2019-12-10 NOTE — Anesthesia Postprocedure Evaluation (Signed)
Anesthesia Post Note  Patient: [name]  Procedure(s) Performed: CARDIOVERSION (N/A )     Patient location during evaluation: Endoscopy Anesthesia Type: General Level of consciousness: awake and alert Pain management: pain level controlled Vital Signs Assessment: post-procedure vital signs reviewed and stable Respiratory status: spontaneous breathing, nonlabored ventilation, respiratory function stable and patient connected to nasal cannula oxygen Cardiovascular status: stable and blood pressure returned to baseline Postop Assessment: no apparent nausea or vomiting Anesthetic complications: no    Last Vitals:  Vitals:   12/10/19 0952 12/10/19 1003  BP: (!) 145/59 132/63  Pulse: (!) 49 (!) 51  Resp: (!) 25 15  Temp:    SpO2: 98% 100%    Last Pain:  Vitals:   12/10/19 0930  TempSrc: Axillary  PainSc: 0-No pain                 Naythen Heikkila

## 2019-12-10 NOTE — Anesthesia Procedure Notes (Signed)
Procedure Name: General with mask airway Date/Time: 12/10/2019 9:23 AM Performed by: Wilburn Cornelia, CRNA Pre-anesthesia Checklist: Patient identified, Emergency Drugs available, Suction available, Patient being monitored and Timeout performed Patient Re-evaluated:Patient Re-evaluated prior to induction Oxygen Delivery Method: Ambu bag Preoxygenation: Pre-oxygenation with 100% oxygen Induction Type: IV induction Placement Confirmation: positive ETCO2 and breath sounds checked- equal and bilateral Dental Injury: Teeth and Oropharynx as per pre-operative assessment

## 2019-12-10 NOTE — Transfer of Care (Signed)
Immediate Anesthesia Transfer of Care Note  Patient: ETHNE BENTS  Procedure(s) Performed: CARDIOVERSION (N/A )  Patient Location: Endoscopy Unit  Anesthesia Type:General  Level of Consciousness: awake, alert  and oriented  Airway & Oxygen Therapy: Patient Spontanous Breathing  Post-op Assessment: Report given to RN and Post -op Vital signs reviewed and stable  Post vital signs: Reviewed and stable  Last Vitals:  Vitals Value Taken Time  BP 133/65 12/10/19 0930  Temp    Pulse 51 12/10/19 0932  Resp 27 12/10/19 0932  SpO2 94 % 12/10/19 0932  Vitals shown include unvalidated device data.  Last Pain:  Vitals:   12/10/19 0835  TempSrc: Axillary  PainSc:          Complications: No apparent anesthesia complications

## 2019-12-10 NOTE — Anesthesia Preprocedure Evaluation (Signed)
Anesthesia Evaluation  Patient identified by MRN, date of birth, ID band Patient awake    Reviewed: Allergy & Precautions, H&P , NPO status , Patient's Chart, lab work & pertinent test results, reviewed documented beta blocker date and time   History of Anesthesia Complications Negative for: history of anesthetic complications  Airway Mallampati: II  TM Distance: >3 FB Neck ROM: Full    Dental no notable dental hx. (+) Teeth Intact, Dental Advisory Given   Pulmonary sleep apnea , neg recent URI,    breath sounds clear to auscultation       Cardiovascular hypertension, Pt. on medications and Pt. on home beta blockers + dysrhythmias Atrial Fibrillation  Rhythm:Irregular Rate:Normal     Neuro/Psych  Headaches, Anxiety    GI/Hepatic Neg liver ROS, hiatal hernia, GERD  Medicated and Controlled,  Endo/Other  diabetes, Type 2, Oral Hypoglycemic AgentsHypothyroidism   Renal/GU negative Renal ROS  negative genitourinary   Musculoskeletal  (+) Arthritis , Osteoarthritis,    Abdominal   Peds  Hematology  (+) Blood dyscrasia, anemia ,   Anesthesia Other Findings   Reproductive/Obstetrics negative OB ROS                             Anesthesia Physical Anesthesia Plan  ASA: III  Anesthesia Plan: General   Post-op Pain Management:    Induction: Intravenous  PONV Risk Score and Plan: 3 and Treatment may vary due to age or medical condition  Airway Management Planned: Mask  Additional Equipment: None  Intra-op Plan:   Post-operative Plan:   Informed Consent: I have reviewed the patients History and Physical, chart, labs and discussed the procedure including the risks, benefits and alternatives for the proposed anesthesia with the patient or authorized representative who has indicated his/her understanding and acceptance.     Dental advisory given  Plan Discussed with: CRNA and  Surgeon  Anesthesia Plan Comments:         Anesthesia Quick Evaluation

## 2019-12-11 ENCOUNTER — Encounter: Payer: Self-pay | Admitting: *Deleted

## 2019-12-17 ENCOUNTER — Other Ambulatory Visit: Payer: Self-pay

## 2019-12-17 ENCOUNTER — Ambulatory Visit (HOSPITAL_COMMUNITY)
Admission: RE | Admit: 2019-12-17 | Discharge: 2019-12-17 | Disposition: A | Discharge status: Home / Self Care | Payer: Medicare PPO | Source: Ambulatory Visit | Attending: Nurse Practitioner | Admitting: Nurse Practitioner

## 2019-12-17 ENCOUNTER — Encounter (HOSPITAL_COMMUNITY): Payer: Self-pay | Admitting: Nurse Practitioner

## 2019-12-17 VITALS — BP 122/88 | HR 94 | Ht 66.0 in | Wt 183.2 lb

## 2019-12-17 DIAGNOSIS — Z85850 Personal history of malignant neoplasm of thyroid: Secondary | ICD-10-CM | POA: Insufficient documentation

## 2019-12-17 DIAGNOSIS — Z79899 Other long term (current) drug therapy: Secondary | ICD-10-CM | POA: Diagnosis not present

## 2019-12-17 DIAGNOSIS — I420 Dilated cardiomyopathy: Secondary | ICD-10-CM | POA: Diagnosis not present

## 2019-12-17 DIAGNOSIS — Z8249 Family history of ischemic heart disease and other diseases of the circulatory system: Secondary | ICD-10-CM | POA: Diagnosis not present

## 2019-12-17 DIAGNOSIS — E119 Type 2 diabetes mellitus without complications: Secondary | ICD-10-CM | POA: Diagnosis not present

## 2019-12-17 DIAGNOSIS — Z853 Personal history of malignant neoplasm of breast: Secondary | ICD-10-CM | POA: Insufficient documentation

## 2019-12-17 DIAGNOSIS — I4819 Other persistent atrial fibrillation: Secondary | ICD-10-CM | POA: Insufficient documentation

## 2019-12-17 DIAGNOSIS — Z7901 Long term (current) use of anticoagulants: Secondary | ICD-10-CM | POA: Insufficient documentation

## 2019-12-17 DIAGNOSIS — Z7984 Long term (current) use of oral hypoglycemic drugs: Secondary | ICD-10-CM | POA: Diagnosis not present

## 2019-12-17 DIAGNOSIS — D6869 Other thrombophilia: Secondary | ICD-10-CM | POA: Diagnosis not present

## 2019-12-17 DIAGNOSIS — Z880 Allergy status to penicillin: Secondary | ICD-10-CM | POA: Insufficient documentation

## 2019-12-17 DIAGNOSIS — I10 Essential (primary) hypertension: Secondary | ICD-10-CM | POA: Insufficient documentation

## 2019-12-17 DIAGNOSIS — G4733 Obstructive sleep apnea (adult) (pediatric): Secondary | ICD-10-CM | POA: Diagnosis not present

## 2019-12-17 DIAGNOSIS — Z8541 Personal history of malignant neoplasm of cervix uteri: Secondary | ICD-10-CM | POA: Diagnosis not present

## 2019-12-17 DIAGNOSIS — Z833 Family history of diabetes mellitus: Secondary | ICD-10-CM | POA: Diagnosis not present

## 2019-12-17 MED ORDER — AMIODARONE HCL 200 MG PO TABS: 200.0000 mg | ORAL_TABLET | Freq: Every day | ORAL | 3 refills | Status: DC

## 2019-12-17 MED ORDER — APIXABAN 5 MG PO TABS: 5.0000 mg | ORAL_TABLET | Freq: Two times a day (BID) | ORAL | 2 refills | Status: DC

## 2019-12-17 MED ORDER — METOPROLOL TARTRATE 25 MG PO TABS: 25.0000 mg | ORAL_TABLET | Freq: Two times a day (BID) | ORAL | 1 refills | Status: DC

## 2019-12-17 NOTE — Telephone Encounter (Signed)
Eliquis 5mg  refill request received, pt is 86yrs old, weight-83.1kg, Crea-1.11 on 12/03/2019, Diagnosis-Afib, and last seen by Roderic Palau on 12/17/2019. Dose is appropriate based on dosing criteria. Will send in refill to requested pharmacy.

## 2019-12-17 NOTE — Progress Notes (Signed)
Primary Care Physician: Lavone Orn, MD Cardiologist: [name] Pulmonologist: Dr. Elsworth Soho GI: Dr. Dolphus Jenny I Barret is a 86 y.o. female with a h/o afib that has been on amiodarone since June of 2015. She has had many breakthrough events, last one in February with successful cardioversion. She does feel very weak in afib. She went into afib around 2 weeks ago and a RN came out to her house on Friday and documented the afib.  Dr. Rayann Heman was called and left instructions to increase amio to 200 mg bid and stop losartan. Apparently her BP was low on that day.  She is in now in the afib clinc and she is rate controlled and BP is stable. She reports around 2-3 weeks ago she first had 2 teeth pulled and then a shot of cortisone in her knee. She hasn't felt well since. She usually requires a cardioversion to restore SR.    F/u in afib clinic 7/15 after successful cardioversion. She is in SR today and feels improved. She is back to Amio 200 mg daily.  F/u in afib clinic, 11/25/19, with feelings of not being well and being out of rhythm x one week. She has had an UTI and just finished a round of doxycycline. EKG shows atrial flutter  variable rate at 110 bpm.CHA2DS2-VASc Score = 6, continues eliquis 5 mg bid. 1   F/u in  afib clinic, 12/16/18. She had a successful cardioversion but unfortunately felt like it only lasted one day. She is in afib in the 90's. I will try to increase BB and keep amio  at 200 mg a little longer to see if she may return to SR.   Today, she denies symptoms of chest pain, shortness of breath, orthopnea, PND, lower extremity edema, dizziness, presyncope, syncope, or neurologic sequela.Perisistent cough. The patient is tolerating medications without difficulties and is otherwise without complaint today.   Past Medical History:  Diagnosis Date  . Acute bronchitis 11/15/2017  . ADENOCARCINOMA, BREAST 10/13/2010   Qualifier: Diagnosis of  By: Nils Pyle CMA (AAMA), Mearl Latin    .  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 Allergy    SEASONAL  . Anemia    years ago  . ANEMIA, IRON DEFICIENCY 10/14/2010   Qualifier: Diagnosis of  By: Sharlett Iles MD Byrd Hesselbach   . Anemia, unspecified 10/13/2010   Qualifier: Diagnosis of  By: Nils Pyle CMA ([place]), Mearl Latin    . Anticoagulated by anticoagulation treatment - Eliquis 03/30/2014   Started May 2015   . Anxiety state 10/13/2010   Qualifier: Diagnosis of  By: Nils Pyle CMA ([place]), Mearl Latin    . Arthritis    "all my joints" (03/31/2014)  . Atrial fibrillation ([place])   . Atrial fibrillation with RVR ([place]) 03/31/2015   DLCO dropped from 100% in 2015-50% in 01/2017  . Barrett's esophagus   . Breast cancer [facility])    s/p right breast lumpectomy  . CARDIAC MURMUR 10/13/2010   Qualifier: Diagnosis of  By: Nils Pyle CMA ([place]), Mearl Latin    . 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 Cervical cancer ([place])   . Chronic cough 01/02/2017   GERD, hiatal hernia Doubt amio  . COLONIC POLYPS, ADENOMATOUS, HX OF 10/13/2010   Qualifier: Diagnosis of  By: Nils Pyle CMA (AAMA), Mearl Latin    . Degenerative disorder of eye 07/25/2012  . Diabetes mellitus without complication ([place])   . Dilated cardiomyopathy ([place]) 08/27/2018  . DIVERTICULOSIS, COLON 10/13/2010   Qualifier: Diagnosis of  By: Nils Pyle CMA (  AAMA), Mearl Latin    . DJD (degenerative joint disease) of knee   . Dysrhythmia    Afib  . EROSIVE ESOPHAGITIS 10/13/2010   Qualifier: Diagnosis of  By: Nils Pyle CMA ([place]), Mearl Latin    . Error, refractive, myopia 07/09/2012  . Essential hypertension 10/13/2010   Qualifier: Diagnosis of  By: Nils Pyle CMA ([place]), Mearl Latin    . Exotropia, intermittent 07/09/2012  . GERD 10/14/2010   Qualifier: Diagnosis of  By: Sharlett Iles MD Byrd Hesselbach GERD (gastroesophageal reflux disease)   . Glaucoma   . H/O hiatal hernia   . Heart murmur   . Hiatal hernia s/p robotic repair & fundoplication [id number]   Qualifier: Diagnosis of  By: Nils Pyle CMA (AAMA), Mearl Latin    . History of  laser assisted in situ keratomileusis 07/25/2012  . Hx of transesophageal echocardiography (TEE) for monitoring    TEE (03/2014): No LAA clot, moderate LAE, core triatriatum type structure in RA with no stenosis, normal EF 55-60%, mild MR  . HYPERCHOLESTEROLEMIA 10/13/2010   Qualifier: Diagnosis of  By: Nils Pyle CMA ([place]), Mearl Latin    . Hypertension   . Hypothyroidism, postsurgical   . Irritable bowel syndrome 10/13/2010   Qualifier: Diagnosis of  By: Nils Pyle CMA (AAMA), Mearl Latin    . Lumbar stenosis with neurogenic claudication 04/20/2016  . Malignant neoplasm of breast ([place]) 02/25/2016  . Migraine    "last one was years ago" (03/31/2014)  . MIGRAINE HEADACHE 10/13/2010   Qualifier: Diagnosis of  By: Nils Pyle CMA ([place]), Mearl Latin    . 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 OSA on CPAP    settings at 10-11   . OSTEOARTHRITIS 10/13/2010   Qualifier: Diagnosis of  By: Nils Pyle CMA ([place]), Mearl Latin    . Post-surgical hypothyroidism 04/09/2014  . RECTAL FISSURE 10/13/2010   Qualifier: Diagnosis of  By: Nils Pyle CMA ([place]), Mearl Latin    . S/P Nissen fundoplication (without gastrostomy tube) procedure 05/25/2017  . Sleep apnea   . THYROID CANCER 10/13/2010   Qualifier: Diagnosis of  By: Nils Pyle CMA ([place]), Mearl Latin    . Thyroid cancer [facility])    s/p thyroidectomy  . Type II diabetes mellitus ([place])    type II    Past Surgical History:  Procedure Laterality Date  . ABDOMINAL HYSTERECTOMY     "partial"  . BREAST BIOPSY Right   . BREAST LUMPECTOMY Right   . CARDIOVERSION N/A 03/30/2014   Procedure: CARDIOVERSION - BEDSIDE;  Surgeon: [name], MD;  Location: [place];  Service: Cardiovascular;  Laterality: N/A;  . CARDIOVERSION N/A 03/31/2014   Procedure: CARDIOVERSION  (BEDSIDE) ;  Surgeon: Lelon Perla, MD;  Location: [place];  Service: Cardiovascular;  Laterality: N/A;  . CARDIOVERSION N/A 04/27/2014   Procedure: CARDIOVERSION;  Surgeon: Darlin Coco, MD;  Location: [place];  Service:  Cardiovascular;  Laterality: N/A;  . CARDIOVERSION N/A 04/13/2015   Procedure: CARDIOVERSION;  Surgeon: Thayer Headings, MD;  Location: [place];  Service: Cardiovascular;  Laterality: N/A;  . CARDIOVERSION N/A 01/08/2017   Procedure: CARDIOVERSION;  Surgeon: Sanda Klein, MD;  Location: [facility] ENDOSCOPY;  Service: Cardiovascular;  Laterality: N/A;  . CARDIOVERSION N/A 09/19/2017   Procedure: CARDIOVERSION;  Surgeon: [name], MD;  Location: [facility] ENDOSCOPY;  Service: Cardiovascular;  Laterality: N/A;  . CARDIOVERSION N/A 10/25/2017   Procedure: CARDIOVERSION;  Surgeon: Acie Fredrickson Wonda Cheng, MD;  Location: [facility] ENDOSCOPY;  Service: Cardiovascular;  Laterality: N/A;  . CARDIOVERSION N/A 12/27/2018  Procedure: CARDIOVERSION;  Surgeon: [name], MD;  Location[facility] ENDOSCOPY;  Service: Cardiovascular;  Laterality: N/A;  . CARDIOVERSION N/A 05/20/2019   Procedure: CARDIOVERSION;  Surgeon: [name], MD;  Location: [facility] ENDOSCOPY;  Service: Cardiovascular;  Laterality: N/A;  . CARDIOVERSION N/A 12/10/2019   Procedure: CARDIOVERSION;  Surgeon: Pixie Casino, MD;  Location: [place];  Service: Cardiovascular;  Laterality: N/A;  . CARPAL TUNNEL RELEASE Right   . CATARACT EXTRACTION W/ INTRAOCULAR LENS  IMPLANT, BILATERAL Bilateral   . COLONOSCOPY    . EXCISIONAL HEMORRHOIDECTOMY    . INSERTION OF MESH N/A 05/25/2017   Procedure: INSERTION OF MESH;  Surgeon: Ralene Ok, MD;  Location: [facility];  Service: General;  Laterality: N/A;  . TEE WITHOUT CARDIOVERSION N/A 03/30/2014   Procedure: TRANSESOPHAGEAL ECHOCARDIOGRAM (TEE);  Surgeon: [name], MD;  Location: [facility] ENDOSCOPY;  Service: Cardiovascular;  Laterality: N/A;  . THYROIDECTOMY    . TONSILLECTOMY      Current Outpatient Medications  Medication Sig Dispense Refill  . acetaminophen (TYLENOL) 650 MG CR tablet Take 650 mg by mouth every 8 (eight) hours.     Marland Kitchen amiodarone (PACERONE) 200 MG tablet Take 1 tablet (200 mg  total) by mouth 2 (two) times daily. 90 tablet 3  . calcium carbonate (OSCAL) 1500 (600 Ca) MG TABS tablet Take 600 mg of elemental calcium by mouth daily.    . cetirizine (ZYRTEC) 10 MG tablet Take 10 mg by mouth daily.     . Cholecalciferol (DIALYVITE VITAMIN D 5000) 125 MCG (5000 UT) capsule Take 5,000 Units by mouth daily.    Marland Kitchen ELIQUIS 5 MG TABS tablet TAKE 1 TABLET BY MOUTH TWO  TIMES DAILY (Patient taking differently: Take 5 mg by mouth 2 (two) times daily. ) 180 tablet 2  . ferrous sulfate 325 (65 FE) MG tablet Take 325 mg by mouth daily with breakfast.    . fluticasone (FLONASE) 50 MCG/ACT nasal spray Place 1 spray into both nostrils daily as needed for allergies.     Marland Kitchen lansoprazole (PREVACID) 30 MG capsule Take 30 mg by mouth daily.     Marland Kitchen levothyroxine (SYNTHROID) 137 MCG tablet Take 137 mcg by mouth daily.     Marland Kitchen LORazepam (ATIVAN) 1 MG tablet Take 1.5 mg by mouth at bedtime.     . Lutein 6 MG CAPS Take 6 mg by mouth 2 (two) times a week.    . metFORMIN (GLUCOPHAGE-XR) 500 MG 24 hr tablet Take 500 mg by mouth 2 (two) times daily.     . metoprolol tartrate (LOPRESSOR) 25 MG tablet Take 1 tablet (25 mg total) by mouth 2 (two) times daily. 180 tablet 1  . Multiple Vitamins-Minerals (PRESERVISION AREDS 2) CAPS Take 1 capsule by mouth daily.    . Multiple Vitamins-Minerals (ZINC PO) Take 1 tablet by mouth 2 (two) times a week.     . Polyvinyl Alcohol-Povidone PF (REFRESH) 1.4-0.6 % SOLN Place 1 drop into both eyes daily as needed (dry eyes).     . potassium chloride SA (KLOR-CON) 20 MEQ tablet Take 1 tablet (20 mEq total) by mouth daily. 90 tablet 3  . rosuvastatin (CRESTOR) 10 MG tablet Take 10 mg by mouth at bedtime.    . sertraline (ZOLOFT) 50 MG tablet Take 50 mg by mouth daily.    . vitamin B-12 (CYANOCOBALAMIN) 1000 MCG tablet Take 1,000 mcg by mouth as needed (energy).      No current facility-administered medications for this encounter.  Allergies  Allergen Reactions  .  Penicillins Itching, Rash and Other (See Comments)    Did it involve swelling of the face/tongue/throat, SOB, or low BP? No Did it involve sudden or severe rash/hives, skin peeling, or any reaction on the inside of your mouth or nose? No Did you need to seek medical attention at a hospital or doctor's office? No When did it last happen?30+ years If all above answers are "NO", may proceed with cephalosporin use.     Social History   Socioeconomic History  . Marital status: Divorced    Spouse name: Not on file  . Number of children: 2  . Years of education: Not on file  . Highest education level: Not on file  Occupational History  . Occupation: Retired    Comment: Education officer, museum  Tobacco Use  . Smoking status: Never Smoker  . Smokeless tobacco: Never Used  Substance and Sexual Activity  . Alcohol use: No    Alcohol/week: 0.0 standard drinks  . Drug use: No  . Sexual activity: Never  Other Topics Concern  . Not on file  Social History Narrative  . Not on file   Social Determinants of Health   Financial Resource Strain:   . Difficulty of Paying Living Expenses: Not on file  Food Insecurity:   . Worried About Charity fundraiser in the Last Year: Not on file  . Ran Out of Food in the Last Year: Not on file  Transportation Needs:   . Lack of Transportation (Medical): Not on file  . Lack of Transportation (Non-Medical): Not on file  Physical Activity:   . Days of Exercise per Week: Not on file  . Minutes of Exercise per Session: Not on file  Stress:   . Feeling of Stress : Not on file  Social Connections:   . Frequency of Communication with Friends and Family: Not on file  . Frequency of Social Gatherings with Friends and Family: Not on file  . Attends Religious Services: Not on file  . Active Member of Clubs or Organizations: Not on file  . Attends Archivist Meetings: Not on file  . Marital Status: Not on file  Intimate Partner Violence:   . Fear of  Current or Ex-Partner: Not on file  . Emotionally Abused: Not on file  . Physically Abused: Not on file  . Sexually Abused: Not on file    Family History  Problem Relation Age of Onset  . Prostate cancer Father   . Diabetes Sister   . Heart disease Sister   . Heart attack Brother   . Heart disease Brother   . Diabetes Brother   . Diabetes Sister   . Diabetes Sister   . Pancreatic cancer Sister   . Diabetes Brother   . Heart disease Brother   . Diabetes Brother   . Heart disease Brother   . Diabetes Brother   . Heart disease Brother   . Diabetes Brother   . Heart disease Brother   . Diabetes Brother   . Heart disease Brother   . Diabetes Brother   . Diabetes Child     ROS- All systems are reviewed and negative except as per the HPI above  Physical Exam: Vitals:   12/17/19 1118  BP: 122/88  Pulse: 94  Weight: 83.1 kg  Height: 5\' 6"  (1.676 m)   Wt Readings from Last 3 Encounters:  12/17/19 83.1 kg  12/03/19 83.9 kg  11/25/19 81.9 kg  Labs: Lab Results  Component Value Date   NA 139 12/03/2019   K 4.4 12/03/2019   CL 106 12/03/2019   CO2 21 (L) 12/03/2019   GLUCOSE 120 (H) 12/03/2019   BUN 18 12/03/2019   CREATININE 1.11 (H) 12/03/2019   CALCIUM 9.1 12/03/2019   MG 2.2 06/07/2017   Lab Results  Component Value Date   INR 1.12 04/20/2016   No results found for: CHOL, HDL, LDLCALC, TRIG   GEN- The patient is well appearing, alert and oriented x 3 today.   Head- normocephalic, atraumatic Eyes-  Sclera clear, conjunctiva pink Ears- hearing intact Oropharynx- clear Neck- supple, no JVP Lymph- no cervical lymphadenopathy Lungs- Clear to ausculation bilaterally, normal work of breathing Heart- irregular rate and rhythm, no murmurs, rubs or gallops, PMI not laterally displaced GI- soft, NT, ND, + BS Extremities- no clubbing, cyanosis, or edema MS- no significant deformity or atrophy Skin- no rash or lesion Psych- euthymic mood, full affect Neuro-  strength and sensation are intact  EKG- atrial fibrillation at 94 bpm Epic records reviewed   Assessment and Plan: 1. Persistent afib Multiple cardioversion's in the past  I fear amiodarone may becoming less effective  Successful  DCCV but ERAF Remains in atrial fib despite extra amiodarone   Will continue with increased  Amiodarone load of  200 mg bid for now  Continue eliquis 5 mg bid  CHA2DS2-VASc Score =6 Increase  Metoprolol   to 25 mg bid and see if may help with extra amiodarone  to convert  Am not ruling out with extra amiodarone loading trying one more cardioversion. She  feels bad in afib and her next to last cardioversion lasted almost one year Options limited to keep in rhythm   2. HTN Stable  F/u in afib clinic in 10 days   Geroge Baseman. Devlyn Parish, [facility] [address] [place], [place] [address] [phone]

## 2019-12-17 NOTE — Addendum Note (Signed)
Encounter addended by: Enid Derry, CMA on: 12/17/2019 2:35 PM  Actions taken: Order list changed

## 2019-12-17 NOTE — Patient Instructions (Addendum)
Continue Taking Amiodarone 200mg  twice daily until follow-up  Increase Metoprolol 25mg  twice daily until follow-up

## 2019-12-26 ENCOUNTER — Other Ambulatory Visit: Payer: Self-pay | Admitting: Cardiovascular Disease

## 2019-12-26 MED ORDER — POTASSIUM CHLORIDE CRYS ER 20 MEQ PO TBCR: 20.0000 meq | EXTENDED_RELEASE_TABLET | Freq: Every day | ORAL | 2 refills | Status: DC

## 2019-12-28 ENCOUNTER — Encounter: Payer: Self-pay | Admitting: Anesthesiology

## 2019-12-28 NOTE — Addendum Note (Signed)
Addendum  created 12/28/19 1858 by Oleta Mouse, MD   Intraprocedure Event edited, Intraprocedure Staff edited

## 2019-12-29 ENCOUNTER — Encounter (HOSPITAL_COMMUNITY): Payer: Self-pay | Admitting: Nurse Practitioner

## 2019-12-29 ENCOUNTER — Ambulatory Visit (HOSPITAL_COMMUNITY)
Admission: RE | Admit: 2019-12-29 | Discharge: 2019-12-29 | Disposition: A | Discharge status: Home / Self Care | Payer: Medicare PPO | Source: Ambulatory Visit | Attending: Nurse Practitioner | Admitting: Nurse Practitioner

## 2019-12-29 ENCOUNTER — Other Ambulatory Visit (HOSPITAL_COMMUNITY)
Admission: RE | Admit: 2019-12-29 | Discharge: 2019-12-29 | Disposition: A | Discharge status: Home / Self Care | Payer: Medicare PPO | Source: Ambulatory Visit | Attending: Cardiology | Admitting: Cardiology

## 2019-12-29 ENCOUNTER — Other Ambulatory Visit: Payer: Self-pay

## 2019-12-29 ENCOUNTER — Ambulatory Visit (HOSPITAL_COMMUNITY): Payer: Medicare PPO | Admitting: Nurse Practitioner

## 2019-12-29 ENCOUNTER — Other Ambulatory Visit (HOSPITAL_COMMUNITY): Payer: Self-pay | Admitting: *Deleted

## 2019-12-29 VITALS — BP 150/80 | HR 83 | Ht 66.0 in | Wt 183.8 lb

## 2019-12-29 DIAGNOSIS — Z833 Family history of diabetes mellitus: Secondary | ICD-10-CM | POA: Insufficient documentation

## 2019-12-29 DIAGNOSIS — G4733 Obstructive sleep apnea (adult) (pediatric): Secondary | ICD-10-CM | POA: Diagnosis not present

## 2019-12-29 DIAGNOSIS — Z7984 Long term (current) use of oral hypoglycemic drugs: Secondary | ICD-10-CM | POA: Diagnosis not present

## 2019-12-29 DIAGNOSIS — Z8719 Personal history of other diseases of the digestive system: Secondary | ICD-10-CM | POA: Insufficient documentation

## 2019-12-29 DIAGNOSIS — Z7901 Long term (current) use of anticoagulants: Secondary | ICD-10-CM | POA: Diagnosis not present

## 2019-12-29 DIAGNOSIS — I10 Essential (primary) hypertension: Secondary | ICD-10-CM | POA: Insufficient documentation

## 2019-12-29 DIAGNOSIS — Z20822 Contact with and (suspected) exposure to covid-19: Secondary | ICD-10-CM | POA: Insufficient documentation

## 2019-12-29 DIAGNOSIS — K219 Gastro-esophageal reflux disease without esophagitis: Secondary | ICD-10-CM | POA: Diagnosis not present

## 2019-12-29 DIAGNOSIS — I4819 Other persistent atrial fibrillation: Secondary | ICD-10-CM | POA: Insufficient documentation

## 2019-12-29 DIAGNOSIS — Z853 Personal history of malignant neoplasm of breast: Secondary | ICD-10-CM | POA: Diagnosis not present

## 2019-12-29 DIAGNOSIS — F419 Anxiety disorder, unspecified: Secondary | ICD-10-CM | POA: Insufficient documentation

## 2019-12-29 DIAGNOSIS — I420 Dilated cardiomyopathy: Secondary | ICD-10-CM | POA: Insufficient documentation

## 2019-12-29 DIAGNOSIS — E890 Postprocedural hypothyroidism: Secondary | ICD-10-CM | POA: Diagnosis not present

## 2019-12-29 DIAGNOSIS — Z85850 Personal history of malignant neoplasm of thyroid: Secondary | ICD-10-CM | POA: Insufficient documentation

## 2019-12-29 DIAGNOSIS — D6869 Other thrombophilia: Secondary | ICD-10-CM

## 2019-12-29 DIAGNOSIS — Z8541 Personal history of malignant neoplasm of cervix uteri: Secondary | ICD-10-CM | POA: Insufficient documentation

## 2019-12-29 DIAGNOSIS — Z880 Allergy status to penicillin: Secondary | ICD-10-CM | POA: Insufficient documentation

## 2019-12-29 DIAGNOSIS — Z79890 Hormone replacement therapy (postmenopausal): Secondary | ICD-10-CM | POA: Insufficient documentation

## 2019-12-29 DIAGNOSIS — E119 Type 2 diabetes mellitus without complications: Secondary | ICD-10-CM | POA: Insufficient documentation

## 2019-12-29 DIAGNOSIS — Z79899 Other long term (current) drug therapy: Secondary | ICD-10-CM | POA: Insufficient documentation

## 2019-12-29 DIAGNOSIS — Z8249 Family history of ischemic heart disease and other diseases of the circulatory system: Secondary | ICD-10-CM | POA: Insufficient documentation

## 2019-12-29 LAB — SARS CORONAVIRUS 2 (TAT 6-24 HRS): SARS Coronavirus 2: NEGATIVE

## 2019-12-29 MED ORDER — METOPROLOL TARTRATE 25 MG PO TABS: 25.0000 mg | ORAL_TABLET | Freq: Two times a day (BID) | ORAL | 2 refills | Status: DC

## 2019-12-29 NOTE — Progress Notes (Signed)
Primary Care Physician: Lavone Orn, MD Cardiologist: [name] Pulmonologist: Dr. Elsworth Soho GI: Dr. Dolphus Jenny [name] is a 86 y.o. female with a h/o afib that has been on amiodarone since June of 2015. She has had many breakthrough events, last one in February with successful cardioversion. She does feel very weak in afib. She went into afib around 2 weeks ago and a RN came out to her house on Friday and documented the afib.  Dr. Rayann Heman was called and left instructions to increase amio to 200 mg bid and stop losartan. Apparently her BP was low on that day.  She is in now in the afib clinc and she is rate controlled and BP is stable. She reports around 2-3 weeks ago she first had 2 teeth pulled and then a shot of cortisone in her knee. She hasn't felt well since. She usually requires a cardioversion to restore SR.    F/u in afib clinic 7/15 after successful cardioversion. She is in SR today and feels improved. She is back to Amio 200 mg daily.  F/u in afib clinic, 11/25/19, with feelings of not being well and being out of rhythm x one week. She has had an UTI and just finished a round of doxycycline. EKG shows atrial flutter  variable rate at 110 bpm.CHA2DS2-VASc Score = 6, continues eliquis 5 mg bid. 1   F/u in  afib clinic, 12/16/18. She had a successful cardioversion but unfortunately felt like it only lasted one day. She is in afib in the 90's. Natasha will try to increase BB and keep amio  at 200 mg a little longer to see if she may return to SR.   Today, she denies symptoms of chest pain, shortness of breath, orthopnea, PND, lower extremity edema, dizziness, presyncope, syncope, or neurologic sequela.Perisistent cough. The patient is tolerating medications without difficulties and is otherwise without complaint today.   Past Medical History:  Diagnosis Date  . Acute bronchitis 11/15/2017  . ADENOCARCINOMA, BREAST 10/13/2010   Qualifier: Diagnosis of  By: Nils Pyle CMA (AAMA), Mearl Latin    .  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 Allergy    SEASONAL  . Anemia    years ago  . ANEMIA, IRON DEFICIENCY 10/14/2010   Qualifier: Diagnosis of  By: Sharlett Iles MD Byrd Hesselbach   . Anemia, unspecified 10/13/2010   Qualifier: Diagnosis of  By: Nils Pyle CMA ([place]), Mearl Latin    . Anticoagulated by anticoagulation treatment - Eliquis 03/30/2014   Started May 2015   . Anxiety state 10/13/2010   Qualifier: Diagnosis of  By: Nils Pyle CMA ([place]), Mearl Latin    . Arthritis    "all my joints" (03/31/2014)  . Atrial fibrillation ([place])   . Atrial fibrillation with RVR ([place]) 03/31/2015   DLCO dropped from 100% in 2015-50% in 01/2017  . Barrett's esophagus   . Breast cancer [facility])    s/p right breast lumpectomy  . CARDIAC MURMUR 10/13/2010   Qualifier: Diagnosis of  By: Nils Pyle CMA ([place]), Mearl Latin    . 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 Cervical cancer ([place])   . Chronic cough 01/02/2017   GERD, hiatal hernia Doubt amio  . COLONIC POLYPS, ADENOMATOUS, HX OF 10/13/2010   Qualifier: Diagnosis of  By: Nils Pyle CMA (AAMA), Mearl Latin    . Degenerative disorder of eye 07/25/2012  . Diabetes mellitus without complication ([place])   . Dilated cardiomyopathy ([place]) 08/27/2018  . DIVERTICULOSIS, COLON 10/13/2010   Qualifier: Diagnosis of  By: Nils Pyle CMA (  AAMA), Mearl Latin    . DJD (degenerative joint disease) of knee   . Dysrhythmia    Afib  . EROSIVE ESOPHAGITIS 10/13/2010   Qualifier: Diagnosis of  By: Nils Pyle CMA ([place]), Mearl Latin    . Error, refractive, myopia 07/09/2012  . Essential hypertension 10/13/2010   Qualifier: Diagnosis of  By: Nils Pyle CMA ([place]), Mearl Latin    . Exotropia, intermittent 07/09/2012  . GERD 10/14/2010   Qualifier: Diagnosis of  By: Sharlett Iles MD Byrd Hesselbach GERD (gastroesophageal reflux disease)   . Glaucoma   . H/O hiatal hernia   . Heart murmur   . Hiatal hernia s/p robotic repair & fundoplication [id number]   Qualifier: Diagnosis of  By: Nils Pyle CMA (AAMA), Mearl Latin    . History of  laser assisted in situ keratomileusis 07/25/2012  . Hx of transesophageal echocardiography (TEE) for monitoring    TEE (03/2014): No LAA clot, moderate LAE, core triatriatum type structure in RA with no stenosis, normal EF 55-60%, mild MR  . HYPERCHOLESTEROLEMIA 10/13/2010   Qualifier: Diagnosis of  By: Nils Pyle CMA ([place]), Mearl Latin    . Hypertension   . Hypothyroidism, postsurgical   . Irritable bowel syndrome 10/13/2010   Qualifier: Diagnosis of  By: Nils Pyle CMA (AAMA), Mearl Latin    . Lumbar stenosis with neurogenic claudication 04/20/2016  . Malignant neoplasm of breast ([place]) 02/25/2016  . Migraine    "last one was years ago" (03/31/2014)  . MIGRAINE HEADACHE 10/13/2010   Qualifier: Diagnosis of  By: Nils Pyle CMA ([place]), Mearl Latin    . 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 OSA on CPAP    settings at 10-11   . OSTEOARTHRITIS 10/13/2010   Qualifier: Diagnosis of  By: Nils Pyle CMA ([place]), Mearl Latin    . Post-surgical hypothyroidism 04/09/2014  . RECTAL FISSURE 10/13/2010   Qualifier: Diagnosis of  By: Nils Pyle CMA ([place]), Mearl Latin    . S/P Nissen fundoplication (without gastrostomy tube) procedure 05/25/2017  . Sleep apnea   . THYROID CANCER 10/13/2010   Qualifier: Diagnosis of  By: Nils Pyle CMA ([place]), Mearl Latin    . Thyroid cancer [facility])    s/p thyroidectomy  . Type II diabetes mellitus ([place])    type II    Past Surgical History:  Procedure Laterality Date  . ABDOMINAL HYSTERECTOMY     "partial"  . BREAST BIOPSY Right   . BREAST LUMPECTOMY Right   . CARDIOVERSION N/A 03/30/2014   Procedure: CARDIOVERSION - BEDSIDE;  Surgeon: [name], MD;  Location: [place];  Service: Cardiovascular;  Laterality: N/A;  . CARDIOVERSION N/A 03/31/2014   Procedure: CARDIOVERSION  (BEDSIDE) ;  Surgeon: Lelon Perla, MD;  Location: [place];  Service: Cardiovascular;  Laterality: N/A;  . CARDIOVERSION N/A 04/27/2014   Procedure: CARDIOVERSION;  Surgeon: Darlin Coco, MD;  Location: [place];  Service:  Cardiovascular;  Laterality: N/A;  . CARDIOVERSION N/A 04/13/2015   Procedure: CARDIOVERSION;  Surgeon: Thayer Headings, MD;  Location: [place];  Service: Cardiovascular;  Laterality: N/A;  . CARDIOVERSION N/A 01/08/2017   Procedure: CARDIOVERSION;  Surgeon: Sanda Klein, MD;  Location: [facility] ENDOSCOPY;  Service: Cardiovascular;  Laterality: N/A;  . CARDIOVERSION N/A 09/19/2017   Procedure: CARDIOVERSION;  Surgeon: [name], MD;  Location: [facility] ENDOSCOPY;  Service: Cardiovascular;  Laterality: N/A;  . CARDIOVERSION N/A 10/25/2017   Procedure: CARDIOVERSION;  Surgeon: Acie Fredrickson Wonda Cheng, MD;  Location: [facility] ENDOSCOPY;  Service: Cardiovascular;  Laterality: N/A;  . CARDIOVERSION N/A 12/27/2018  Procedure: CARDIOVERSION;  Surgeon: [name], MD;  Location: [facility] ENDOSCOPY;  Service: Cardiovascular;  Laterality: N/A;  . CARDIOVERSION N/A 05/20/2019   Procedure: CARDIOVERSION;  Surgeon: [name], MD;  Location: [facility] ENDOSCOPY;  Service: Cardiovascular;  Laterality: N/A;  . CARDIOVERSION N/A 12/10/2019   Procedure: CARDIOVERSION;  Surgeon: Pixie Casino, MD;  Location: [place];  Service: Cardiovascular;  Laterality: N/A;  . CARPAL TUNNEL RELEASE Right   . CATARACT EXTRACTION W/ INTRAOCULAR LENS  IMPLANT, BILATERAL Bilateral   . COLONOSCOPY    . EXCISIONAL HEMORRHOIDECTOMY    . INSERTION OF MESH N/A 05/25/2017   Procedure: INSERTION OF MESH;  Surgeon: Ralene Ok, MD;  Location: [facility];  Service: General;  Laterality: N/A;  . TEE WITHOUT CARDIOVERSION N/A 03/30/2014   Procedure: TRANSESOPHAGEAL ECHOCARDIOGRAM (TEE);  Surgeon: [name], MD;  Location[facility] END[facility]COPY;  Service: Cardiovascular;  Laterality: N/A;  . THYROIDECTOMY    . TONSILLECTOMY      Current Outpatient Medications  Medication Sig Dispense Refill  . acetaminophen (TYLENOL) 650 MG CR tablet Take 650 mg by mouth every 8 (eight) hours.     Marland Kitchen amiodarone (PACERONE) 200 MG tablet Take 1 tablet (200 mg  total) by mouth daily. 90 tablet 3  . apixaban (ELIQUIS) 5 MG TABS tablet Take 1 tablet (5 mg total) by mouth 2 (two) times daily. 180 tablet 2  . calcium carbonate (OSCAL) 1500 (600 Ca) MG TABS tablet Take 600 mg of elemental calcium by mouth daily.    . cetirizine (ZYRTEC) 10 MG tablet Take 10 mg by mouth daily.     . Cholecalciferol (DIALYVITE VITAMIN D 5000) 125 MCG (5000 UT) capsule Take 5,000 Units by mouth daily.    . ferrous sulfate 325 (65 FE) MG tablet Take 325 mg by mouth daily with breakfast.    . fluticasone (FLONASE) 50 MCG/ACT nasal spray Place 1 spray into both nostrils daily as needed for allergies.     Marland Kitchen lansoprazole (PREVACID) 30 MG capsule Take 30 mg by mouth daily.     Marland Kitchen levothyroxine (SYNTHROID) 137 MCG tablet Take 137 mcg by mouth daily.     Marland Kitchen LORazepam (ATIVAN) 1 MG tablet Take 1.5 mg by mouth at bedtime.     . Lutein 6 MG CAPS Take 6 mg by mouth 2 (two) times a week.    . metFORMIN (GLUCOPHAGE-XR) 500 MG 24 hr tablet Take 500 mg by mouth 2 (two) times daily.     . metoprolol tartrate (LOPRESSOR) 25 MG tablet Take 1 tablet (25 mg total) by mouth 2 (two) times daily. 180 tablet 2  . Multiple Vitamins-Minerals (PRESERVISION AREDS 2) CAPS Take 1 capsule by mouth daily.    . Multiple Vitamins-Minerals (ZINC PO) Take 1 tablet by mouth 2 (two) times a week.     . Polyvinyl Alcohol-Povidone PF (REFRESH) 1.4-0.6 % SOLN Place 1 drop into both eyes daily as needed (dry eyes).     . potassium chloride SA (KLOR-CON) 20 MEQ tablet Take 1 tablet (20 mEq total) by mouth daily. 90 tablet 2  . rosuvastatin (CRESTOR) 10 MG tablet Take 10 mg by mouth at bedtime.    . sertraline (ZOLOFT) 50 MG tablet Take 50 mg by mouth daily.    . vitamin B-12 (CYANOCOBALAMIN) 1000 MCG tablet Take 1,000 mcg by mouth as needed (energy).      No current facility-administered medications for this encounter.    Allergies  Allergen Reactions  . Penicillins Itching, Rash and  Other (See Comments)    Did it  involve swelling of the face/tongue/throat, SOB, or low BP? No Did it involve sudden or severe rash/hives, skin peeling, or any reaction on the inside of your mouth or nose? No Did you need to seek medical attention at a hospital or doctor's office? No When did it last happen?30+ years If all above answers are "NO", may proceed with cephalosporin use.     Social History   Socioeconomic History  . Marital status: Divorced    Spouse name: Not on file  . Number of children: 2  . Years of education: Not on file  . Highest education level: Not on file  Occupational History  . Occupation: Retired    Comment: Education officer, museum  Tobacco Use  . Smoking status: Never Smoker  . Smokeless tobacco: Never Used  Substance and Sexual Activity  . Alcohol use: No    Alcohol/week: 0.0 standard drinks  . Drug use: No  . Sexual activity: Never  Other Topics Concern  . Not on file  Social History Narrative  . Not on file   Social Determinants of Health   Financial Resource Strain:   . Difficulty of Paying Living Expenses: Not on file  Food Insecurity:   . Worried About Charity fundraiser in the Last Year: Not on file  . Ran Out of Food in the Last Year: Not on file  Transportation Needs:   . Lack of Transportation (Medical): Not on file  . Lack of Transportation (Non-Medical): Not on file  Physical Activity:   . Days of Exercise per Week: Not on file  . Minutes of Exercise per Session: Not on file  Stress:   . Feeling of Stress : Not on file  Social Connections:   . Frequency of Communication with Friends and Family: Not on file  . Frequency of Social Gatherings with Friends and Family: Not on file  . Attends Religious Services: Not on file  . Active Member of Clubs or Organizations: Not on file  . Attends Archivist Meetings: Not on file  . Marital Status: Not on file  Intimate Partner Violence:   . Fear of Current or Ex-Partner: Not on file  . Emotionally Abused: Not  on file  . Physically Abused: Not on file  . Sexually Abused: Not on file    Family History  Problem Relation Age of Onset  . Prostate cancer Father   . Diabetes Sister   . Heart disease Sister   . Heart attack Brother   . Heart disease Brother   . Diabetes Brother   . Diabetes Sister   . Diabetes Sister   . Pancreatic cancer Sister   . Diabetes Brother   . Heart disease Brother   . Diabetes Brother   . Heart disease Brother   . Diabetes Brother   . Heart disease Brother   . Diabetes Brother   . Heart disease Brother   . Diabetes Brother   . Heart disease Brother   . Diabetes Brother   . Diabetes Child     ROS- All systems are reviewed and negative except as per the HPI above  Physical Exam: Vitals:   12/29/19 1408  BP: (!) 150/80  Pulse: 83  Weight: 83.4 kg  Height: 5\' 6"  (1.676 m)   Wt Readings from Last 3 Encounters:  12/29/19 83.4 kg  12/17/19 83.1 kg  12/03/19 83.9 kg    Labs: Lab Results  Component Value Date  NA 139 12/03/2019   K 4.4 12/03/2019   CL 106 12/03/2019   CO2 21 (L) 12/03/2019   GLUCOSE 120 (H) 12/03/2019   BUN 18 12/03/2019   CREATININE 1.11 (H) 12/03/2019   CALCIUM 9.1 12/03/2019   MG 2.2 06/07/2017   Lab Results  Component Value Date   INR 1.12 04/20/2016   No results found for: CHOL, HDL, LDLCALC, TRIG   GEN- The patient is well appearing, alert and oriented x 3 today.   Head- normocephalic, atraumatic Eyes-  Sclera clear, conjunctiva pink Ears- hearing intact Oropharynx- clear Neck- supple, no JVP Lymph- no cervical lymphadenopathy Lungs- Clear to ausculation bilaterally, normal work of breathing Heart- irregular rate and rhythm, no murmurs, rubs or gallops, PMI not laterally displaced GI- soft, NT, ND, + BS Extremities- no clubbing, cyanosis, or edema MS- no significant deformity or atrophy Skin- no rash or lesion Psych- euthymic mood, full affect Neuro- strength and sensation are intact  EKG- atrial  fibrillation at 94 bpm Epic records reviewed   Assessment and Plan: 1. Persistent afib Multiple cardioversion's in the past  Natasha fear amiodarone may becoming less effective  Successful  DCCV but ERAF  Has been loading on extra amiodarone 200 mg bid and pt would like one last cardioversion before saying it is no longer effective  Continue eliquis 5 mg bid  CHA2DS2-VASc Score =6, no missed doses  Go back to   Metoprolol 12.5 mg bid  as she can be  slow on returning to SR on higher doses of metoprolol  2. HTN Stable  F/u in afib clinic one week after cardioversion Will lower dose of amiodarone back to 200 mg daily at that visit If DCCV  fails and/or ERAF, options are rate control strategy or a amio washout with possible tikosyn admit after 3 months    [name], [facility] [address] [place], [place] [address] [phone]

## 2019-12-29 NOTE — Patient Instructions (Signed)
Cardioversion scheduled for Thursday, February 18th  - Arrive at the [facility] and go to admitting at 1030AM  -Do not eat or drink anything after midnight the night prior to your procedure.  - Take all your morning medication with a sip of water prior to arrival.  - You will not be able to drive home after your procedure.  Day of cardioversion decrease metoprolol back to 1/2 tablet twice a day

## 2019-12-29 NOTE — H&P (View-Only) (Signed)
Primary Care Physician: Lavone Orn, MD Cardiologist: [name] Pulmonologist: Dr. Elsworth Soho GI: Dr. Dolphus Jenny [name] is a 86 y.o. female with a h/o afib that has been on amiodarone since June of 2015. She has had many breakthrough events, last one in February with successful cardioversion. She does feel very weak in afib. She went into afib around 2 weeks ago and a RN came out to her house on Friday and documented the afib.  Dr. Rayann Heman was called and left instructions to increase amio to 200 mg bid and stop losartan. Apparently her BP was low on that day.  She is in now in the afib clinc and she is rate controlled and BP is stable. She reports around 2-3 weeks ago she first had 2 teeth pulled and then a shot of cortisone in her knee. She hasn't felt well since. She usually requires a cardioversion to restore SR.    F/u in afib clinic 7/15 after successful cardioversion. She is in SR today and feels improved. She is back to Amio 200 mg daily.  F/u in afib clinic, 11/25/19, with feelings of not being well and being out of rhythm x one week. She has had an UTI and just finished a round of doxycycline. EKG shows atrial flutter  variable rate at 110 bpm.CHA2DS2-VASc Score = 6, continues eliquis 5 mg bid. 1   F/u in  afib clinic, 12/16/18. She had a successful cardioversion but unfortunately felt like it only lasted one day. She is in afib in the 90's. Natasha will try to increase BB and keep amio  at 200 mg a little longer to see if she may return to SR.   Today, she denies symptoms of chest pain, shortness of breath, orthopnea, PND, lower extremity edema, dizziness, presyncope, syncope, or neurologic sequela.Perisistent cough. The patient is tolerating medications without difficulties and is otherwise without complaint today.   Past Medical History:  Diagnosis Date  . Acute bronchitis 11/15/2017  . ADENOCARCINOMA, BREAST 10/13/2010   Qualifier: Diagnosis of  By: Nils Pyle CMA (AAMA), Mearl Latin    .  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 Allergy    SEASONAL  . Anemia    years ago  . ANEMIA, IRON DEFICIENCY 10/14/2010   Qualifier: Diagnosis of  By: Sharlett Iles MD Byrd Hesselbach   . Anemia, unspecified 10/13/2010   Qualifier: Diagnosis of  By: Nils Pyle CMA ([place]), Mearl Latin    . Anticoagulated by anticoagulation treatment - Eliquis 03/30/2014   Started May 2015   . Anxiety state 10/13/2010   Qualifier: Diagnosis of  By: Nils Pyle CMA ([place]), Mearl Latin    . Arthritis    "all my joints" (03/31/2014)  . Atrial fibrillation ([place])   . Atrial fibrillation with RVR ([place]) 03/31/2015   DLCO dropped from 100% in 2015-50% in 01/2017  . Barrett's esophagus   . Breast cancer [facility])    s/p right breast lumpectomy  . CARDIAC MURMUR 10/13/2010   Qualifier: Diagnosis of  By: Nils Pyle CMA ([place]), Mearl Latin    . 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 Cervical cancer ([place])   . Chronic cough 01/02/2017   GERD, hiatal hernia Doubt amio  . COLONIC POLYPS, ADENOMATOUS, HX OF 10/13/2010   Qualifier: Diagnosis of  By: Nils Pyle CMA (AAMA), Mearl Latin    . Degenerative disorder of eye 07/25/2012  . Diabetes mellitus without complication ([place])   . Dilated cardiomyopathy ([place]) 08/27/2018  . DIVERTICULOSIS, COLON 10/13/2010   Qualifier: Diagnosis of  By: Nils Pyle CMA (  AAMA), Mearl Latin    . DJD (degenerative joint disease) of knee   . Dysrhythmia    Afib  . EROSIVE ESOPHAGITIS 10/13/2010   Qualifier: Diagnosis of  By: Nils Pyle CMA ([place]), Mearl Latin    . Error, refractive, myopia 07/09/2012  . Essential hypertension 10/13/2010   Qualifier: Diagnosis of  By: Nils Pyle CMA ([place]), Mearl Latin    . Exotropia, intermittent 07/09/2012  . GERD 10/14/2010   Qualifier: Diagnosis of  By: Sharlett Iles MD Byrd Hesselbach GERD (gastroesophageal reflux disease)   . Glaucoma   . H/O hiatal hernia   . Heart murmur   . Hiatal hernia s/p robotic repair & fundoplication [id number]   Qualifier: Diagnosis of  By: Nils Pyle CMA (AAMA), Mearl Latin    . History of  laser assisted in situ keratomileusis 07/25/2012  . Hx of transesophageal echocardiography (TEE) for monitoring    TEE (03/2014): No LAA clot, moderate LAE, core triatriatum type structure in RA with no stenosis, normal EF 55-60%, mild MR  . HYPERCHOLESTEROLEMIA 10/13/2010   Qualifier: Diagnosis of  By: Nils Pyle CMA ([place]), Mearl Latin    . Hypertension   . Hypothyroidism, postsurgical   . Irritable bowel syndrome 10/13/2010   Qualifier: Diagnosis of  By: Nils Pyle CMA (AAMA), Mearl Latin    . Lumbar stenosis with neurogenic claudication 04/20/2016  . Malignant neoplasm of breast ([place]) 02/25/2016  . Migraine    "last one was years ago" (03/31/2014)  . MIGRAINE HEADACHE 10/13/2010   Qualifier: Diagnosis of  By: Nils Pyle CMA ([place]), Mearl Latin    . 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 OSA on CPAP    settings at 10-11   . OSTEOARTHRITIS 10/13/2010   Qualifier: Diagnosis of  By: Nils Pyle CMA ([place]), Mearl Latin    . Post-surgical hypothyroidism 04/09/2014  . RECTAL FISSURE 10/13/2010   Qualifier: Diagnosis of  By: Nils Pyle CMA ([place]), Mearl Latin    . S/P Nissen fundoplication (without gastrostomy tube) procedure 05/25/2017  . Sleep apnea   . THYROID CANCER 10/13/2010   Qualifier: Diagnosis of  By: Nils Pyle CMA ([place]), Mearl Latin    . Thyroid cancer [facility])    s/p thyroidectomy  . Type II diabetes mellitus ([place])    type II    Past Surgical History:  Procedure Laterality Date  . ABDOMINAL HYSTERECTOMY     "partial"  . BREAST BIOPSY Right   . BREAST LUMPECTOMY Right   . CARDIOVERSION N/A 03/30/2014   Procedure: CARDIOVERSION - BEDSIDE;  Surgeon: [name], MD;  Location: [place];  Service: Cardiovascular;  Laterality: N/A;  . CARDIOVERSION N/A 03/31/2014   Procedure: CARDIOVERSION  (BEDSIDE) ;  Surgeon: Lelon Perla, MD;  Location: [place];  Service: Cardiovascular;  Laterality: N/A;  . CARDIOVERSION N/A 04/27/2014   Procedure: CARDIOVERSION;  Surgeon: Darlin Coco, MD;  Location: [place];  Service:  Cardiovascular;  Laterality: N/A;  . CARDIOVERSION N/A 04/13/2015   Procedure: CARDIOVERSION;  Surgeon: Thayer Headings, MD;  Location: [place];  Service: Cardiovascular;  Laterality: N/A;  . CARDIOVERSION N/A 01/08/2017   Procedure: CARDIOVERSION;  Surgeon: Sanda Klein, MD;  Location: [facility] ENDOSCOPY;  Service: Cardiovascular;  Laterality: N/A;  . CARDIOVERSION N/A 09/19/2017   Procedure: CARDIOVERSION;  Surgeon: [name], MD;  Location: [facility] ENDOSCOPY;  Service: Cardiovascular;  Laterality: N/A;  . CARDIOVERSION N/A 10/25/2017   Procedure: CARDIOVERSION;  Surgeon: Acie Fredrickson Wonda Cheng, MD;  Location: [facility] ENDOSCOPY;  Service: Cardiovascular;  Laterality: N/A;  . CARDIOVERSION N/A 12/27/2018  Procedure: CARDIOVERSION;  Surgeon: [name], MD;  Location: [facility] ENDOSCOPY;  Service: Cardiovascular;  Laterality: N/A;  . CARDIOVERSION N/A 05/20/2019   Procedure: CARDIOVERSION;  Surgeon: [name], MD;  Location: [facility] ENDOSCOPY;  Service: Cardiovascular;  Laterality: N/A;  . CARDIOVERSION N/A 12/10/2019   Procedure: CARDIOVERSION;  Surgeon: Pixie Casino, MD;  Location: [place];  Service: Cardiovascular;  Laterality: N/A;  . CARPAL TUNNEL RELEASE Right   . CATARACT EXTRACTION W/ INTRAOCULAR LENS  IMPLANT, BILATERAL Bilateral   . COLONOSCOPY    . EXCISIONAL HEMORRHOIDECTOMY    . INSERTION OF MESH N/A 05/25/2017   Procedure: INSERTION OF MESH;  Surgeon: Ralene Ok, MD;  Location: [facility];  Service: General;  Laterality: N/A;  . TEE WITHOUT CARDIOVERSION N/A 03/30/2014   Procedure: TRANSESOPHAGEAL ECHOCARDIOGRAM (TEE);  Surgeon: [name], MD;  Location: [facility] ENDOSCOPY;  Service: Cardiovascular;  Laterality: N/A;  . THYROIDECTOMY    . TONSILLECTOMY      Current Outpatient Medications  Medication Sig Dispense Refill  . acetaminophen (TYLENOL) 650 MG CR tablet Take 650 mg by mouth every 8 (eight) hours.     Marland Kitchen amiodarone (PACERONE) 200 MG tablet Take 1 tablet (200 mg  total) by mouth daily. 90 tablet 3  . apixaban (ELIQUIS) 5 MG TABS tablet Take 1 tablet (5 mg total) by mouth 2 (two) times daily. 180 tablet 2  . calcium carbonate (OSCAL) 1500 (600 Ca) MG TABS tablet Take 600 mg of elemental calcium by mouth daily.    . cetirizine (ZYRTEC) 10 MG tablet Take 10 mg by mouth daily.     . Cholecalciferol (DIALYVITE VITAMIN D 5000) 125 MCG (5000 UT) capsule Take 5,000 Units by mouth daily.    . ferrous sulfate 325 (65 FE) MG tablet Take 325 mg by mouth daily with breakfast.    . fluticasone (FLONASE) 50 MCG/ACT nasal spray Place 1 spray into both nostrils daily as needed for allergies.     Marland Kitchen lansoprazole (PREVACID) 30 MG capsule Take 30 mg by mouth daily.     Marland Kitchen levothyroxine (SYNTHROID) 137 MCG tablet Take 137 mcg by mouth daily.     Marland Kitchen LORazepam (ATIVAN) 1 MG tablet Take 1.5 mg by mouth at bedtime.     . Lutein 6 MG CAPS Take 6 mg by mouth 2 (two) times a week.    . metFORMIN (GLUCOPHAGE-XR) 500 MG 24 hr tablet Take 500 mg by mouth 2 (two) times daily.     . metoprolol tartrate (LOPRESSOR) 25 MG tablet Take 1 tablet (25 mg total) by mouth 2 (two) times daily. 180 tablet 2  . Multiple Vitamins-Minerals (PRESERVISION AREDS 2) CAPS Take 1 capsule by mouth daily.    . Multiple Vitamins-Minerals (ZINC PO) Take 1 tablet by mouth 2 (two) times a week.     . Polyvinyl Alcohol-Povidone PF (REFRESH) 1.4-0.6 % SOLN Place 1 drop into both eyes daily as needed (dry eyes).     . potassium chloride SA (KLOR-CON) 20 MEQ tablet Take 1 tablet (20 mEq total) by mouth daily. 90 tablet 2  . rosuvastatin (CRESTOR) 10 MG tablet Take 10 mg by mouth at bedtime.    . sertraline (ZOLOFT) 50 MG tablet Take 50 mg by mouth daily.    . vitamin B-12 (CYANOCOBALAMIN) 1000 MCG tablet Take 1,000 mcg by mouth as needed (energy).      No current facility-administered medications for this encounter.    Allergies  Allergen Reactions  . Penicillins Itching, Rash and  Other (See Comments)    Did it  involve swelling of the face/tongue/throat, SOB, or low BP? No Did it involve sudden or severe rash/hives, skin peeling, or any reaction on the inside of your mouth or nose? No Did you need to seek medical attention at a hospital or doctor's office? No When did it last happen?30+ years If all above answers are "NO", may proceed with cephalosporin use.     Social History   Socioeconomic History  . Marital status: Divorced    Spouse name: Not on file  . Number of children: 2  . Years of education: Not on file  . Highest education level: Not on file  Occupational History  . Occupation: Retired    Comment: Education officer, museum  Tobacco Use  . Smoking status: Never Smoker  . Smokeless tobacco: Never Used  Substance and Sexual Activity  . Alcohol use: No    Alcohol/week: 0.0 standard drinks  . Drug use: No  . Sexual activity: Never  Other Topics Concern  . Not on file  Social History Narrative  . Not on file   Social Determinants of Health   Financial Resource Strain:   . Difficulty of Paying Living Expenses: Not on file  Food Insecurity:   . Worried About Charity fundraiser in the Last Year: Not on file  . Ran Out of Food in the Last Year: Not on file  Transportation Needs:   . Lack of Transportation (Medical): Not on file  . Lack of Transportation (Non-Medical): Not on file  Physical Activity:   . Days of Exercise per Week: Not on file  . Minutes of Exercise per Session: Not on file  Stress:   . Feeling of Stress : Not on file  Social Connections:   . Frequency of Communication with Friends and Family: Not on file  . Frequency of Social Gatherings with Friends and Family: Not on file  . Attends Religious Services: Not on file  . Active Member of Clubs or Organizations: Not on file  . Attends Archivist Meetings: Not on file  . Marital Status: Not on file  Intimate Partner Violence:   . Fear of Current or Ex-Partner: Not on file  . Emotionally Abused: Not  on file  . Physically Abused: Not on file  . Sexually Abused: Not on file    Family History  Problem Relation Age of Onset  . Prostate cancer Father   . Diabetes Sister   . Heart disease Sister   . Heart attack Brother   . Heart disease Brother   . Diabetes Brother   . Diabetes Sister   . Diabetes Sister   . Pancreatic cancer Sister   . Diabetes Brother   . Heart disease Brother   . Diabetes Brother   . Heart disease Brother   . Diabetes Brother   . Heart disease Brother   . Diabetes Brother   . Heart disease Brother   . Diabetes Brother   . Heart disease Brother   . Diabetes Brother   . Diabetes Child     ROS- All systems are reviewed and negative except as per the HPI above  Physical Exam: Vitals:   12/29/19 1408  BP: (!) 150/80  Pulse: 83  Weight: 83.4 kg  Height: 5\' 6"  (1.676 m)   Wt Readings from Last 3 Encounters:  12/29/19 83.4 kg  12/17/19 83.1 kg  12/03/19 83.9 kg    Labs: Lab Results  Component Value Date  NA 139 12/03/2019   K 4.4 12/03/2019   CL 106 12/03/2019   CO2 21 (L) 12/03/2019   GLUCOSE 120 (H) 12/03/2019   BUN 18 12/03/2019   CREATININE 1.11 (H) 12/03/2019   CALCIUM 9.1 12/03/2019   MG 2.2 06/07/2017   Lab Results  Component Value Date   INR 1.12 04/20/2016   No results found for: CHOL, HDL, LDLCALC, TRIG   GEN- The patient is well appearing, alert and oriented x 3 today.   Head- normocephalic, atraumatic Eyes-  Sclera clear, conjunctiva pink Ears- hearing intact Oropharynx- clear Neck- supple, no JVP Lymph- no cervical lymphadenopathy Lungs- Clear to ausculation bilaterally, normal work of breathing Heart- irregular rate and rhythm, no murmurs, rubs or gallops, PMI not laterally displaced GI- soft, NT, ND, + BS Extremities- no clubbing, cyanosis, or edema MS- no significant deformity or atrophy Skin- no rash or lesion Psych- euthymic mood, full affect Neuro- strength and sensation are intact  EKG- atrial  fibrillation at 94 bpm Epic records reviewed   Assessment and Plan: 1. Persistent afib Multiple cardioversion's in the past  Natasha fear amiodarone may becoming less effective  Successful  DCCV but ERAF  Has been loading on extra amiodarone 200 mg bid and pt would like one last cardioversion before saying it is no longer effective  Continue eliquis 5 mg bid  CHA2DS2-VASc Score =6, no missed doses  Go back to   Metoprolol 12.5 mg bid  as she can be  slow on returning to SR on higher doses of metoprolol  2. HTN Stable  F/u in afib clinic one week after cardioversion Will lower dose of amiodarone back to 200 mg daily at that visit If DCCV  fails and/or ERAF, options are rate control strategy or a amio washout with possible tikosyn admit after 3 months    [name], [facility] [address] [place], [place] [address] [phone]

## 2019-12-30 ENCOUNTER — Other Ambulatory Visit (HOSPITAL_COMMUNITY): Payer: Self-pay | Admitting: *Deleted

## 2019-12-31 ENCOUNTER — Ambulatory Visit (HOSPITAL_COMMUNITY)
Admission: RE | Admit: 2019-12-31 | Discharge: 2019-12-31 | Disposition: A | Discharge status: Home / Self Care | Payer: Medicare PPO | Attending: Cardiovascular Disease | Admitting: Cardiovascular Disease

## 2019-12-31 ENCOUNTER — Encounter (HOSPITAL_COMMUNITY): Payer: Self-pay | Admitting: Cardiovascular Disease

## 2019-12-31 ENCOUNTER — Ambulatory Visit (HOSPITAL_COMMUNITY): Payer: Medicare PPO | Admitting: Certified Registered"

## 2019-12-31 ENCOUNTER — Other Ambulatory Visit: Payer: Self-pay

## 2019-12-31 ENCOUNTER — Encounter (HOSPITAL_COMMUNITY)
Admission: RE | Disposition: A | Discharge status: Home / Self Care | Payer: Medicare PPO | Source: Home / Self Care | Attending: Cardiovascular Disease

## 2019-12-31 DIAGNOSIS — Z7984 Long term (current) use of oral hypoglycemic drugs: Secondary | ICD-10-CM | POA: Diagnosis not present

## 2019-12-31 DIAGNOSIS — Z85850 Personal history of malignant neoplasm of thyroid: Secondary | ICD-10-CM | POA: Insufficient documentation

## 2019-12-31 DIAGNOSIS — I10 Essential (primary) hypertension: Secondary | ICD-10-CM | POA: Diagnosis not present

## 2019-12-31 DIAGNOSIS — I420 Dilated cardiomyopathy: Secondary | ICD-10-CM | POA: Insufficient documentation

## 2019-12-31 DIAGNOSIS — Z853 Personal history of malignant neoplasm of breast: Secondary | ICD-10-CM | POA: Insufficient documentation

## 2019-12-31 DIAGNOSIS — G4733 Obstructive sleep apnea (adult) (pediatric): Secondary | ICD-10-CM | POA: Diagnosis not present

## 2019-12-31 DIAGNOSIS — E039 Hypothyroidism, unspecified: Secondary | ICD-10-CM | POA: Diagnosis not present

## 2019-12-31 DIAGNOSIS — I4819 Other persistent atrial fibrillation: Secondary | ICD-10-CM | POA: Diagnosis not present

## 2019-12-31 DIAGNOSIS — K219 Gastro-esophageal reflux disease without esophagitis: Secondary | ICD-10-CM | POA: Insufficient documentation

## 2019-12-31 DIAGNOSIS — Z79899 Other long term (current) drug therapy: Secondary | ICD-10-CM | POA: Diagnosis not present

## 2019-12-31 DIAGNOSIS — E119 Type 2 diabetes mellitus without complications: Secondary | ICD-10-CM | POA: Diagnosis not present

## 2019-12-31 DIAGNOSIS — G4730 Sleep apnea, unspecified: Secondary | ICD-10-CM | POA: Diagnosis not present

## 2019-12-31 DIAGNOSIS — Z7901 Long term (current) use of anticoagulants: Secondary | ICD-10-CM | POA: Insufficient documentation

## 2019-12-31 DIAGNOSIS — E7800 Pure hypercholesterolemia, unspecified: Secondary | ICD-10-CM | POA: Insufficient documentation

## 2019-12-31 DIAGNOSIS — Z79890 Hormone replacement therapy (postmenopausal): Secondary | ICD-10-CM | POA: Diagnosis not present

## 2019-12-31 DIAGNOSIS — I4891 Unspecified atrial fibrillation: Secondary | ICD-10-CM | POA: Insufficient documentation

## 2019-12-31 HISTORY — PX: CARDIOVERSION: SHX1299

## 2019-12-31 LAB — GLUCOSE, CAPILLARY: Glucose-Capillary: 115 mg/dL — ABNORMAL HIGH (ref 70–99)

## 2019-12-31 SURGERY — CARDIOVERSION
Anesthesia: General

## 2019-12-31 MED ORDER — SODIUM CHLORIDE 0.9 % IV SOLN: INTRAVENOUS | Status: DC | PRN

## 2019-12-31 MED ORDER — LIDOCAINE 2% (20 MG/ML) 5 ML SYRINGE
INTRAMUSCULAR | Status: DC | PRN
  Administered 2019-12-31: 60 mg via INTRAVENOUS

## 2019-12-31 MED ORDER — PROPOFOL 10 MG/ML IV BOLUS
INTRAVENOUS | Status: DC | PRN
  Administered 2019-12-31: 60 mg via INTRAVENOUS

## 2019-12-31 NOTE — Interval H&P Note (Signed)
History and Physical Interval Note:  12/31/2019 12:17 PM  [name]  has presented today for surgery, with the diagnosis of AFIB.  The various methods of treatment have been discussed with the patient and family. After consideration of risks, benefits and other options for treatment, the patient has consented to  Procedure(s): CARDIOVERSION (N/A) as a surgical intervention.  The patient's history has been reviewed, patient examined, no change in status, stable for surgery.  I have reviewed the patient's chart and labs.  Questions were answered to the patient's satisfaction.     Zadaya Cuadra

## 2019-12-31 NOTE — Anesthesia Preprocedure Evaluation (Addendum)
Anesthesia Evaluation  Patient identified by MRN, date of birth, ID band Patient awake    Reviewed: Allergy & Precautions, H&P , NPO status , Patient's Chart, lab work & pertinent test results  Airway Mallampati: III  TM Distance: >3 FB Neck ROM: Full    Dental no notable dental hx. (+) Teeth Intact, Dental Advisory Given   Pulmonary sleep apnea ,    Pulmonary exam normal breath sounds clear to auscultation       Cardiovascular hypertension, Pt. on medications and Pt. on home beta blockers + dysrhythmias Atrial Fibrillation  Rhythm:Irregular Rate:Normal     Neuro/Psych  Headaches, Anxiety    GI/Hepatic Neg liver ROS, hiatal hernia, GERD  Medicated and Controlled,  Endo/Other  diabetes, Type 2, Oral Hypoglycemic AgentsHypothyroidism   Renal/GU negative Renal ROS  negative genitourinary   Musculoskeletal  (+) Arthritis , Osteoarthritis,    Abdominal   Peds  Hematology  (+) Blood dyscrasia, anemia ,   Anesthesia Other Findings   Reproductive/Obstetrics negative OB ROS                            Anesthesia Physical Anesthesia Plan  ASA: III  Anesthesia Plan: General   Post-op Pain Management:    Induction: Intravenous  PONV Risk Score and Plan: 3 and Propofol infusion and Treatment may vary due to age or medical condition  Airway Management Planned: Mask  Additional Equipment:   Intra-op Plan:   Post-operative Plan:   Informed Consent: I have reviewed the patients History and Physical, chart, labs and discussed the procedure including the risks, benefits and alternatives for the proposed anesthesia with the patient or authorized representative who has indicated his/her understanding and acceptance.     Dental advisory given  Plan Discussed with: CRNA  Anesthesia Plan Comments:         Anesthesia Quick Evaluation

## 2019-12-31 NOTE — Anesthesia Postprocedure Evaluation (Signed)
Anesthesia Post Note  Patient: [name]  Procedure(s) Performed: CARDIOVERSION (N/A )     Patient location during evaluation: Endoscopy Anesthesia Type: General Level of consciousness: awake and alert Pain management: pain level controlled Vital Signs Assessment: post-procedure vital signs reviewed and stable Respiratory status: spontaneous breathing, nonlabored ventilation and respiratory function stable Cardiovascular status: blood pressure returned to baseline and stable Postop Assessment: no apparent nausea or vomiting Anesthetic complications: no    Last Vitals:  Vitals:   12/31/19 1018 12/31/19 1030  BP: (!) 115/54 (!) 119/55  Pulse:  (!) 44  Resp: (!) 22 (!) 21  Temp: (!) 35.8 C   SpO2: 95% 98%    Last Pain:  Vitals:   12/31/19 1030  TempSrc:   PainSc: 0-No pain                 Zaul Hubers,W. EDMOND

## 2019-12-31 NOTE — Transfer of Care (Signed)
Immediate Anesthesia Transfer of Care Note  Patient: [name]  Procedure(s) Performed: CARDIOVERSION (N/A )  Patient Location: Endoscopy Unit  Anesthesia Type:General  Level of Consciousness: awake, alert  and oriented  Airway & Oxygen Therapy: Patient Spontanous Breathing  Post-op Assessment: Report given to RN  Post vital signs: Reviewed and stable  Last Vitals:  Vitals Value Taken Time  BP    Temp    Pulse    Resp    SpO2      Last Pain:  Vitals:   12/31/19 0952  TempSrc: Oral  PainSc: 0-No pain         Complications: No apparent anesthesia complications

## 2019-12-31 NOTE — Op Note (Signed)
Procedure: Electrical Cardioversion Indications:  Atrial Fibrillation  Procedure Details:  Consent: Risks of procedure as well as the alternatives and risks of each were explained to the (patient/caregiver).  Consent for procedure obtained.  Time Out: Verified patient identification, verified procedure, site/side was marked, verified correct patient position, special equipment/implants available, medications/allergies/relevent history reviewed, required imaging and test results available.  Performed  Patient placed on cardiac monitor, pulse oximetry, supplemental oxygen as necessary.  Sedation given: Propofol 60 mg IV, Dr. Kalman Shan Pacer pads placed anterior and posterior chest.  Cardioverted 2 time(s).  Cardioversion with synchronized biphasic 120J shock (unsuccessful), second synchronized shock at 200J (successful).  Evaluation: Findings: Post procedure EKG shows: sinus bradycardia 46 bpm Complications: None Patient did tolerate procedure well.  Time Spent Directly with the Patient:  30 minutes   [name] 12/31/2019, 10:11 AM

## 2020-01-07 ENCOUNTER — Encounter: Payer: Self-pay | Admitting: Cardiovascular Disease

## 2020-01-07 ENCOUNTER — Ambulatory Visit: Payer: Medicare PPO | Admitting: Cardiovascular Disease

## 2020-01-07 ENCOUNTER — Other Ambulatory Visit: Payer: Self-pay

## 2020-01-07 VITALS — BP 148/84 | HR 54 | Ht 66.0 in | Wt 183.1 lb

## 2020-01-07 DIAGNOSIS — I4819 Other persistent atrial fibrillation: Secondary | ICD-10-CM

## 2020-01-07 NOTE — Progress Notes (Signed)
Cardiology Office Note:    Date:  01/07/2020   ID:  [name], DOB [birth year], MRN [id number]  PCP:  Lavone Orn, MD  Cardiologist:  Sherren Mocha, MD  Electrophysiologist:  None   Referring MD: Lavone Orn, MD   Chief Complaint  Patient presents with  . Shortness of Breath    History of Present Illness:    [name] is a 86 y.o. female with a hx of persistent atrial fibrillation maintained on long-term amiodarone since 2015, presenting for follow-up evaluation.  The patient has had multiple breakthrough events requiring repeated cardioversions.  She is highly symptomatic when atrial fibrillation occurs.  She last underwent cardioversion December 31, 2019.  Prior to that she underwent cardioversion December 10, 2019 but did not stay in sinus rhythm for long period.  Amiodarone was increased to a dose of 200 mg twice daily prior to her cardioversion on December 31, 2019.  She presents today for follow-up evaluation and is shown to be in sinus rhythm at a heart rate of 54 bpm.  The patient is here alone today.  She is not feeling very well.  She complains of nausea and a feeling of shakiness after her morning medicines.  She thinks she is just getting used to taking the increased dose of amiodarone.  She otherwise denies any chest pain or pressure.  She has mild dyspnea with exertion and mild orthopnea, but no significant breathing problems reported.  She complains of a chronic nonproductive cough.  She denies PND or leg swelling.  Past Medical History:  Diagnosis Date  . Acute bronchitis 11/15/2017  . ADENOCARCINOMA, BREAST 10/13/2010   Qualifier: Diagnosis of  By: Nils Pyle CMA (AAMA), Mearl Latin    . 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 Allergy    SEASONAL  . Anemia    years ago  . ANEMIA, IRON DEFICIENCY 10/14/2010   Qualifier: Diagnosis of  By: Sharlett Iles MD Byrd Hesselbach   . Anemia, unspecified 10/13/2010   Qualifier: Diagnosis of  By: Nils Pyle CMA ([place]),  Mearl Latin    . Anticoagulated by anticoagulation treatment - Eliquis 03/30/2014   Started May 2015   . Anxiety state 10/13/2010   Qualifier: Diagnosis of  By: Nils Pyle CMA ([place]), Mearl Latin    . Arthritis    "all my joints" (03/31/2014)  . Atrial fibrillation ([place])   . Atrial fibrillation with RVR ([place]) 03/31/2015   DLCO dropped from 100% in 2015-50% in 01/2017  . Barrett's esophagus   . Breast cancer [facility])    s/p right breast lumpectomy  . CARDIAC MURMUR 10/13/2010   Qualifier: Diagnosis of  By: Nils Pyle CMA ([place]), Mearl Latin    . 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 Cervical cancer ([place])   . Chronic cough 01/02/2017   GERD, hiatal hernia Doubt amio  . COLONIC POLYPS, ADENOMATOUS, HX OF 10/13/2010   Qualifier: Diagnosis of  By: Nils Pyle CMA (AAMA), Mearl Latin    . Degenerative disorder of eye 07/25/2012  . Diabetes mellitus without complication ([place])   . Dilated cardiomyopathy ([place]) 08/27/2018  . DIVERTICULOSIS, COLON 10/13/2010   Qualifier: Diagnosis of  By: Nils Pyle CMA (AAMA), Mearl Latin    . DJD (degenerative joint disease) of knee   . Dysrhythmia    Afib  . EROSIVE ESOPHAGITIS 10/13/2010   Qualifier: Diagnosis of  By: Nils Pyle CMA ([place]), Mearl Latin    . Error, refractive, myopia 07/09/2012  . Essential hypertension 10/13/2010   Qualifier: Diagnosis of  By: Nils Pyle CMA ([place]), Mearl Latin    .  Exotropia, intermittent 07/09/2012  . GERD 10/14/2010   Qualifier: Diagnosis of  By: Sharlett Iles MD Byrd Hesselbach GERD (gastroesophageal reflux disease)   . Glaucoma   . H/O hiatal hernia   . Heart murmur   . Hiatal hernia s/p robotic repair & fundoplication [id number]   Qualifier: Diagnosis of  By: Nils Pyle CMA (AAMA), Mearl Latin    . History of laser assisted in situ keratomileusis 07/25/2012  . Hx of transesophageal echocardiography (TEE) for monitoring    TEE (03/2014): No LAA clot, moderate LAE, core triatriatum type structure in RA with no stenosis, normal EF 55-60%, mild MR  . HYPERCHOLESTEROLEMIA 10/13/2010    Qualifier: Diagnosis of  By: Nils Pyle CMA ([place]), Mearl Latin    . Hypertension   . Hypothyroidism, postsurgical   . Irritable bowel syndrome 10/13/2010   Qualifier: Diagnosis of  By: Nils Pyle CMA (AAMA), Mearl Latin    . Lumbar stenosis with neurogenic claudication 04/20/2016  . Malignant neoplasm of breast ([place]) 02/25/2016  . Migraine    "last one was years ago" (03/31/2014)  . MIGRAINE HEADACHE 10/13/2010   Qualifier: Diagnosis of  By: Nils Pyle CMA ([place]), Mearl Latin    . 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 OSA on CPAP    settings at 10-11   . OSTEOARTHRITIS 10/13/2010   Qualifier: Diagnosis of  By: Nils Pyle CMA ([place]), Mearl Latin    . Post-surgical hypothyroidism 04/09/2014  . RECTAL FISSURE 10/13/2010   Qualifier: Diagnosis of  By: Nils Pyle CMA ([place]), Mearl Latin    . S/P Nissen fundoplication (without gastrostomy tube) procedure 05/25/2017  . Sleep apnea   . THYROID CANCER 10/13/2010   Qualifier: Diagnosis of  By: Nils Pyle CMA ([place]), Mearl Latin    . Thyroid cancer [facility])    s/p thyroidectomy  . Type II diabetes mellitus ([place])    type II     Past Surgical History:  Procedure Laterality Date  . ABDOMINAL HYSTERECTOMY     "partial"  . BREAST BIOPSY Right   . BREAST LUMPECTOMY Right   . CARDIOVERSION N/A 03/30/2014   Procedure: CARDIOVERSION - BEDSIDE;  Surgeon: [name], MD;  Location: [place];  Service: Cardiovascular;  Laterality: N/A;  . CARDIOVERSION N/A 03/31/2014   Procedure: CARDIOVERSION  (BEDSIDE) ;  Surgeon: Lelon Perla, MD;  Location: [place];  Service: Cardiovascular;  Laterality: N/A;  . CARDIOVERSION N/A 04/27/2014   Procedure: CARDIOVERSION;  Surgeon: Darlin Coco, MD;  Location: [place];  Service: Cardiovascular;  Laterality: N/A;  . CARDIOVERSION N/A 04/13/2015   Procedure: CARDIOVERSION;  Surgeon: Thayer Headings, MD;  Location: [place];  Service: Cardiovascular;  Laterality: N/A;  . CARDIOVERSION N/A 01/08/2017   Procedure: CARDIOVERSION;  Surgeon: Sanda Klein,  MD;  Location: [facility] ENDOSCOPY;  Service: Cardiovascular;  Laterality: N/A;  . CARDIOVERSION N/A 09/19/2017   Procedure: CARDIOVERSION;  Surgeon: [name], MD;  Location: [facility] ENDOSCOPY;  Service: Cardiovascular;  Laterality: N/A;  . CARDIOVERSION N/A 10/25/2017   Procedure: CARDIOVERSION;  Surgeon: Acie Fredrickson Wonda Cheng, MD;  Location: [facility] ENDOSCOPY;  Service: Cardiovascular;  Laterality: N/A;  . CARDIOVERSION N/A 12/27/2018   Procedure: CARDIOVERSION;  Surgeon: [name], MD;  Location: [facility] ENDOSCOPY;  Service: Cardiovascular;  Laterality: N/A;  . CARDIOVERSION N/A 05/20/2019   Procedure: CARDIOVERSION;  Surgeon: [name], MD;  Location: [facility] ENDOSCOPY;  Service: Cardiovascular;  Laterality: N/A;  . CARDIOVERSION N/A 12/10/2019   Procedure: CARDIOVERSION;  Surgeon: Pixie Casino, MD;  Location: [place];  Service: Cardiovascular;  Laterality: N/A;  . CARDIOVERSION N/A 12/31/2019   Procedure: CARDIOVERSION;  Surgeon: Sanda Klein, MD;  Location: [facility] ENDOSCOPY;  Service: Cardiovascular;  Laterality: N/A;  . CARPAL TUNNEL RELEASE Right   . CATARACT EXTRACTION W/ INTRAOCULAR LENS  IMPLANT, BILATERAL Bilateral   . COLONOSCOPY    . EXCISIONAL HEMORRHOIDECTOMY    . INSERTION OF MESH N/A 05/25/2017   Procedure: INSERTION OF MESH;  Surgeon: Ralene Ok, MD;  Location: [facility];  Service: General;  Laterality: N/A;  . TEE WITHOUT CARDIOVERSION N/A 03/30/2014   Procedure: TRANSESOPHAGEAL ECHOCARDIOGRAM (TEE);  Surgeon: [name], MD;  Location: [facility] ENDOSCOPY;  Service: Cardiovascular;  Laterality: N/A;  . THYROIDECTOMY    . TONSILLECTOMY      Current Medications: Current Meds  Medication Sig  . acetaminophen (TYLENOL) 650 MG CR tablet Take 650 mg by mouth every 8 (eight) hours.   Marland Kitchen amiodarone (PACERONE) 200 MG tablet Take 200 mg by mouth 2 (two) times daily.  Marland Kitchen apixaban (ELIQUIS) 5 MG TABS tablet Take 1 tablet (5 mg total) by mouth 2 (two) times daily.  . calcium carbonate  (OSCAL) 1500 (600 Ca) MG TABS tablet Take 600 mg of elemental calcium by mouth daily.  . cetirizine (ZYRTEC) 10 MG tablet Take 10 mg by mouth daily.   . Cholecalciferol (DIALYVITE VITAMIN D 5000) 125 MCG (5000 UT) capsule Take 5,000 Units by mouth daily.  . ferrous sulfate 325 (65 FE) MG tablet Take 325 mg by mouth daily with breakfast.  . fluticasone (FLONASE) 50 MCG/ACT nasal spray Place 1 spray into both nostrils at bedtime.   . lansoprazole (PREVACID) 30 MG capsule Take 30 mg by mouth daily.   Marland Kitchen levothyroxine (SYNTHROID) 137 MCG tablet Take 137 mcg by mouth daily.   Marland Kitchen LORazepam (ATIVAN) 1 MG tablet Take 1.5 mg by mouth at bedtime.   . Lutein 6 MG CAPS Take 6 mg by mouth once a week.   . metFORMIN (GLUCOPHAGE-XR) 500 MG 24 hr tablet Take 500 mg by mouth 2 (two) times daily.   . metoprolol tartrate (LOPRESSOR) 25 MG tablet Take 1 tablet (25 mg total) by mouth 2 (two) times daily.  . Multiple Vitamins-Minerals (PRESERVISION AREDS 2) CAPS Take 1 capsule by mouth daily.  . Multiple Vitamins-Minerals (ZINC PO) Take 0.5 tablets by mouth 2 (two) times a week.   . Polyvinyl Alcohol-Povidone PF (REFRESH) 1.4-0.6 % SOLN Place 1 drop into both eyes daily as needed (dry eyes).   . potassium chloride SA (KLOR-CON) 20 MEQ tablet Take 1 tablet (20 mEq total) by mouth daily.  . rosuvastatin (CRESTOR) 10 MG tablet Take 10 mg by mouth at bedtime.  . sertraline (ZOLOFT) 50 MG tablet Take 50 mg by mouth daily.  . vitamin B-12 (CYANOCOBALAMIN) 1000 MCG tablet Take 1,000 mcg by mouth as needed (energy).      Allergies:   Penicillins   Social History   Socioeconomic History  . Marital status: Divorced    Spouse name: Not on file  . Number of children: 2  . Years of education: Not on file  . Highest education level: Not on file  Occupational History  . Occupation: Retired    Comment: Education officer, museum  Tobacco Use  . Smoking status: Never Smoker  . Smokeless tobacco: Never Used  Substance and Sexual  Activity  . Alcohol use: No    Alcohol/week: 0.0 standard drinks  . Drug use: No  . Sexual activity: Never  Other Topics Concern  .  Not on file  Social History Narrative  . Not on file   Social Determinants of Health   Financial Resource Strain:   . Difficulty of Paying Living Expenses: Not on file  Food Insecurity:   . Worried About Charity fundraiser in the Last Year: Not on file  . Ran Out of Food in the Last Year: Not on file  Transportation Needs:   . Lack of Transportation (Medical): Not on file  . Lack of Transportation (Non-Medical): Not on file  Physical Activity:   . Days of Exercise per Week: Not on file  . Minutes of Exercise per Session: Not on file  Stress:   . Feeling of Stress : Not on file  Social Connections:   . Frequency of Communication with Friends and Family: Not on file  . Frequency of Social Gatherings with Friends and Family: Not on file  . Attends Religious Services: Not on file  . Active Member of Clubs or Organizations: Not on file  . Attends Archivist Meetings: Not on file  . Marital Status: Not on file     Family History: The patient's family history includes Diabetes in her brother, brother, brother, brother, brother, brother, brother, child, sister, sister, and sister; Heart attack in her brother; Heart disease in her brother, brother, brother, brother, brother, brother, and sister; Pancreatic cancer in her sister; Prostate cancer in her father.  ROS:   Please see the history of present illness.    All other systems reviewed and are negative.  EKGs/Labs/Other Studies Reviewed:    The following studies were reviewed today: Echocardiogram 04/24/2018: Study Conclusions   - Left ventricle: The cavity size was normal. There was moderate  concentric hypertrophy. Systolic function was vigorous. The  estimated ejection fraction was in the range of 65% to 70%. Wall  motion was normal; there were no regional wall motion   abnormalities. Features are consistent with a pseudonormal left  ventricular filling pattern, with concomitant abnormal relaxation  and increased filling pressure (grade 2 diastolic dysfunction).  - Aortic valve: There was mild regurgitation.  - Mitral valve: Calcified annulus. Mildly thickened leaflets .  There was mild regurgitation.  - Left atrium: The atrium was moderately dilated.  - Right atrium: The atrium was mildly dilated.  - Tricuspid valve: There was mild regurgitation.  - Pulmonary arteries: Systolic pressure was mildly to moderately  increased. PA peak pressure: 40 mm Hg (S).  - Inferior vena cava: The vessel was normal in size. The  respirophasic diameter changes were in the normal range (= 50%),  consistent with normal central venous pressure.  - Pericardium, extracardiac: There was no pericardial effusion.   Impressions:   - When compared to the rpior study from 12/11/2016 biventricular  systolic function has improved and is now normal.   EKG:  EKG is ordered today.  The ekg ordered today demonstrates sinus bradycardia 54 bpm, possible age-indeterminate inferior infarct, wandering baseline, otherwise normal.  Thank you  Recent Labs: 05/12/2019: ALT 17 09/30/2019: TSH 0.033 12/03/2019: BUN 18; Creatinine, Ser 1.11; Hemoglobin 13.8; Platelets 166; Potassium 4.4; Sodium 139  Recent Lipid Panel No results found for: CHOL, TRIG, HDL, CHOLHDL, VLDL, LDLCALC, LDLDIRECT  Physical Exam:    VS:  BP (!) 148/84   Pulse (!) 54   Ht 5\' 6"  (1.676 m)   Wt 183 lb 1.9 oz (83.1 kg)   LMP  (LMP Unknown)   SpO2 97%   BMI 29.56 kg/m  Wt Readings from Last 3 Encounters:  01/07/20 183 lb 1.9 oz (83.1 kg)  12/31/19 180 lb (81.6 kg)  12/29/19 183 lb 12.8 oz (83.4 kg)     GEN:  Well nourished, well developed pleasant elderly woman in no acute distress HEENT: Normal NECK: No JVD; No carotid bruits LYMPHATICS: No lymphadenopathy CARDIAC: RRR, 2/6 systolic  ejection murmur at the right upper sternal border RESPIRATORY:  Clear to auscultation without rales, wheezing or rhonchi  ABDOMEN: Soft, non-tender, non-distended MUSCULOSKELETAL:  No edema; No deformity  SKIN: Warm and dry NEUROLOGIC:  Alert and oriented x 3 PSYCHIATRIC:  Normal affect   ASSESSMENT:    1. Persistent atrial fibrillation (HCC)    PLAN:    In order of problems listed above:  1. The patient is maintaining sinus rhythm on a higher dose of amiodarone after repeated cardioversions.  I recommended that she continue on her current medical program.  We talked about reducing her metoprolol to 12.5 mg twice daily, but she was concerned about doing this because of the frequency of atrial fibrillation that she is experienced.  For now we will leave her medications the same and give her some more time to adjust to taking the increased dose of amiodarone.  She requested that we check her thyroid studies again as her dose has been adjusted and these lab tests will be ordered.  She will continue on apixaban for anticoagulation.  She will continue close follow-up in the atrial fibrillation clinic.  I will plan to see her back in 6 months.  If she continues to have symptoms, would have a low threshold to check an echocardiogram and might even consider outpatient telemetry monitoring.   Medication Adjustments/Labs and Tests Ordered: Current medicines are reviewed at length with the patient today.  Concerns regarding medicines are outlined above.  Orders Placed This Encounter  Procedures  . TSH  . T4, free  . EKG 12-Lead   No orders of the defined types were placed in this encounter.   Patient Instructions  Medication Instructions:  Your provider recommends that you continue on your current medications as directed. Please refer to the Current Medication list given to you today.   *If you need a refill on your cardiac medications before your next appointment, please call your  pharmacy*  Lab Work: TODAY: TSH, FT4 If you have labs (blood work) drawn today and your tests are completely normal, you will receive your results only by: Marland Kitchen MyChart Message (if you have MyChart) OR . A paper copy in the mail If you have any lab test that is abnormal or we need to change your treatment, we will call you to review the results.  Follow-Up: At [facility], you and your health needs are our priority.  As part of our continuing mission to provide you with exceptional heart care, we have created designated Provider Care Teams.  These Care Teams include your primary Cardiologist (physician) and Advanced Practice Providers (APPs -  Physician Assistants and Nurse Practitioners) who all work together to provide you with the care you need, when you need it. Your next appointment:   6 month(s) The format for your next appointment:   In Person Provider:   You may see Sherren Mocha, MD or one of the following Advanced Practice Providers on your designated Care Team:    Richardson Dopp, PA-C  Vin Tool, PA-C  Daune Perch, [place]    Signed, Sherren Mocha, MD  01/07/2020 3:08 PM    [facility]

## 2020-01-07 NOTE — Patient Instructions (Signed)
Medication Instructions:  Your provider recommends that you continue on your current medications as directed. Please refer to the Current Medication list given to you today.   *If you need a refill on your cardiac medications before your next appointment, please call your pharmacy*  Lab Work: TODAY: TSH, FT4 If you have labs (blood work) drawn today and your tests are completely normal, you will receive your results only by: Marland Kitchen MyChart Message (if you have MyChart) OR . A paper copy in the mail If you have any lab test that is abnormal or we need to change your treatment, we will call you to review the results.  Follow-Up: At [facility], you and your health needs are our priority.  As part of our continuing mission to provide you with exceptional heart care, we have created designated Provider Care Teams.  These Care Teams include your primary Cardiologist (physician) and Advanced Practice Providers (APPs -  Physician Assistants and Nurse Practitioners) who all work together to provide you with the care you need, when you need it. Your next appointment:   6 month(s) The format for your next appointment:   In Person Provider:   You may see Sherren Mocha, MD or one of the following Advanced Practice Providers on your designated Care Team:    Richardson Dopp, PA-C  Vin [place], [place]  Daune Perch, [place]

## 2020-01-08 LAB — TSH: TSH: 0.745 u[IU]/mL (ref 0.450–4.500)

## 2020-01-08 LAB — T4, FREE: Free T4: 2.42 ng/dL — ABNORMAL HIGH (ref 0.82–1.77)

## 2020-01-15 ENCOUNTER — Ambulatory Visit (HOSPITAL_COMMUNITY): Payer: Medicare PPO | Admitting: Nurse Practitioner

## 2020-01-15 ENCOUNTER — Other Ambulatory Visit: Payer: Self-pay

## 2020-01-15 ENCOUNTER — Encounter (HOSPITAL_COMMUNITY): Payer: Self-pay | Admitting: Nurse Practitioner

## 2020-01-15 ENCOUNTER — Ambulatory Visit (HOSPITAL_COMMUNITY)
Admission: RE | Admit: 2020-01-15 | Discharge: 2020-01-15 | Disposition: A | Discharge status: Home / Self Care | Payer: Medicare PPO | Source: Ambulatory Visit | Attending: Nurse Practitioner | Admitting: Nurse Practitioner

## 2020-01-15 VITALS — BP 142/80 | HR 48 | Ht 66.0 in | Wt 182.0 lb

## 2020-01-15 DIAGNOSIS — F419 Anxiety disorder, unspecified: Secondary | ICD-10-CM | POA: Diagnosis not present

## 2020-01-15 DIAGNOSIS — Z79890 Hormone replacement therapy (postmenopausal): Secondary | ICD-10-CM | POA: Diagnosis not present

## 2020-01-15 DIAGNOSIS — Z853 Personal history of malignant neoplasm of breast: Secondary | ICD-10-CM | POA: Insufficient documentation

## 2020-01-15 DIAGNOSIS — E890 Postprocedural hypothyroidism: Secondary | ICD-10-CM | POA: Insufficient documentation

## 2020-01-15 DIAGNOSIS — Z8249 Family history of ischemic heart disease and other diseases of the circulatory system: Secondary | ICD-10-CM | POA: Insufficient documentation

## 2020-01-15 DIAGNOSIS — R001 Bradycardia, unspecified: Secondary | ICD-10-CM | POA: Insufficient documentation

## 2020-01-15 DIAGNOSIS — D6869 Other thrombophilia: Secondary | ICD-10-CM | POA: Diagnosis not present

## 2020-01-15 DIAGNOSIS — I10 Essential (primary) hypertension: Secondary | ICD-10-CM | POA: Insufficient documentation

## 2020-01-15 DIAGNOSIS — Z8541 Personal history of malignant neoplasm of cervix uteri: Secondary | ICD-10-CM | POA: Insufficient documentation

## 2020-01-15 DIAGNOSIS — Z833 Family history of diabetes mellitus: Secondary | ICD-10-CM | POA: Diagnosis not present

## 2020-01-15 DIAGNOSIS — I420 Dilated cardiomyopathy: Secondary | ICD-10-CM | POA: Insufficient documentation

## 2020-01-15 DIAGNOSIS — K219 Gastro-esophageal reflux disease without esophagitis: Secondary | ICD-10-CM | POA: Insufficient documentation

## 2020-01-15 DIAGNOSIS — E7800 Pure hypercholesterolemia, unspecified: Secondary | ICD-10-CM | POA: Insufficient documentation

## 2020-01-15 DIAGNOSIS — I4819 Other persistent atrial fibrillation: Secondary | ICD-10-CM | POA: Diagnosis present

## 2020-01-15 DIAGNOSIS — G4733 Obstructive sleep apnea (adult) (pediatric): Secondary | ICD-10-CM | POA: Insufficient documentation

## 2020-01-15 DIAGNOSIS — Z7901 Long term (current) use of anticoagulants: Secondary | ICD-10-CM | POA: Insufficient documentation

## 2020-01-15 DIAGNOSIS — Z7984 Long term (current) use of oral hypoglycemic drugs: Secondary | ICD-10-CM | POA: Insufficient documentation

## 2020-01-15 DIAGNOSIS — Z85850 Personal history of malignant neoplasm of thyroid: Secondary | ICD-10-CM | POA: Diagnosis not present

## 2020-01-15 DIAGNOSIS — E119 Type 2 diabetes mellitus without complications: Secondary | ICD-10-CM | POA: Insufficient documentation

## 2020-01-15 DIAGNOSIS — Z79899 Other long term (current) drug therapy: Secondary | ICD-10-CM | POA: Diagnosis not present

## 2020-01-15 DIAGNOSIS — K2270 Barrett's esophagus without dysplasia: Secondary | ICD-10-CM | POA: Insufficient documentation

## 2020-01-15 DIAGNOSIS — Z880 Allergy status to penicillin: Secondary | ICD-10-CM | POA: Insufficient documentation

## 2020-01-15 MED ORDER — METOPROLOL TARTRATE 25 MG PO TABS: 12.5000 mg | ORAL_TABLET | Freq: Two times a day (BID) | ORAL | 2 refills | Status: DC

## 2020-01-15 NOTE — Progress Notes (Addendum)
Primary Care Physician: Lavone Orn, MD Cardiologist: [name] Pulmonologist: Dr. Elsworth Soho GI: Dr. Dolphus Jenny [name] is a 86 y.o. female with a h/o afib that has been on amiodarone since June of 2015. She has had many breakthrough events, last one in February with successful cardioversion. She does feel very weak in afib. She went into afib around 2 weeks ago and a RN came out to her house on Friday and documented the afib.  Dr. Rayann Heman was called and left instructions to increase amio to 200 mg bid and stop losartan. Apparently her BP was low on that day.  She is in now in the afib clinc and she is rate controlled and BP is stable. She reports around 2-3 weeks ago she first had 2 teeth pulled and then a shot of cortisone in her knee. She hasn't felt well since. She usually requires a cardioversion to restore SR.    F/u in afib clinic 7/15 after successful cardioversion. She is in SR today and feels improved. She is back to Amio 200 mg daily.  F/u in afib clinic, 11/25/19, with feelings of not being well and being out of rhythm x one week. She has had an UTI and just finished a round of doxycycline. EKG shows atrial flutter  variable rate at 110 bpm.CHA2DS2-VASc Score = 6, continues eliquis 5 mg bid. 1   F/u in  afib clinic, 12/16/18. She had a successful cardioversion but unfortunately felt like it only lasted one day. She is in afib in the 90's. Natasha will try to increase BB and keep amio  at 200 mg a little longer to see if she may return to [place].   F/u  in afib clinic, 01/15/20.  F/u cardioversion 2/17. She was kept on her higher dose of BB at time of cardioversion as she did covert out but with PAC's. She saw [name] one week out and was staying in [place]. She  was left on amiodarone 200 mg bid until f/u here. She called the office earlier in the week and c/o of feeling weak. EKG shows  Sinus brady at 48 bpm. Natasha feel her bradycardia is the reason for her weakness and will titrate meds down today.    Today, she denies symptoms of chest pain, shortness of breath, orthopnea, PND, lower extremity edema, dizziness, presyncope, syncope, or neurologic sequela.Perisistent cough. The patient is tolerating medications without difficulties and is otherwise without complaint today.   Past Medical History:  Diagnosis Date  . Acute bronchitis 11/15/2017  . ADENOCARCINOMA, BREAST 10/13/2010   Qualifier: Diagnosis of  By: Nils Pyle CMA (AAMA), Mearl Latin    . 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 Allergy    SEASONAL  . Anemia    years ago  . ANEMIA, IRON DEFICIENCY 10/14/2010   Qualifier: Diagnosis of  By: Sharlett Iles MD Byrd Hesselbach   . Anemia, unspecified 10/13/2010   Qualifier: Diagnosis of  By: Nils Pyle CMA ([place]), Mearl Latin    . Anticoagulated by anticoagulation treatment - Eliquis 03/30/2014   Started May 2015   . Anxiety state 10/13/2010   Qualifier: Diagnosis of  By: Nils Pyle CMA ([place]), Mearl Latin    . Arthritis    "all my joints" (03/31/2014)  . Atrial fibrillation ([place])   . Atrial fibrillation with RVR ([place]) 03/31/2015   DLCO dropped from 100% in 2015-50% in 01/2017  . Barrett's esophagus   . Breast cancer [facility])    s/p right breast lumpectomy  . CARDIAC  MURMUR 10/13/2010   Qualifier: Diagnosis of  By: Nils Pyle CMA ([place]), Mearl Latin    . 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 Cervical cancer ([place])   . Chronic cough 01/02/2017   GERD, hiatal hernia Doubt amio  . COLONIC POLYPS, ADENOMATOUS, HX OF 10/13/2010   Qualifier: Diagnosis of  By: Nils Pyle CMA (AAMA), Mearl Latin    . Degenerative disorder of eye 07/25/2012  . Diabetes mellitus without complication ([place])   . Dilated cardiomyopathy ([place]) 08/27/2018  . DIVERTICULOSIS, COLON 10/13/2010   Qualifier: Diagnosis of  By: Nils Pyle CMA (AAMA), Mearl Latin    . DJD (degenerative joint disease) of knee   . Dysrhythmia    Afib  . EROSIVE ESOPHAGITIS 10/13/2010   Qualifier: Diagnosis of  By: Nils Pyle CMA ([place]), Mearl Latin    . Error, refractive, myopia 07/09/2012  .  Essential hypertension 10/13/2010   Qualifier: Diagnosis of  By: Nils Pyle CMA ([place]), Mearl Latin    . Exotropia, intermittent 07/09/2012  . GERD 10/14/2010   Qualifier: Diagnosis of  By: Sharlett Iles MD Byrd Hesselbach GERD (gastroesophageal reflux disease)   . Glaucoma   . H/O hiatal hernia   . Heart murmur   . Hiatal hernia s/p robotic repair & fundoplication 7/67/3419 37/07/239   Qualifier: Diagnosis of  By: Nils Pyle CMA (AAMA), Mearl Latin    . History of laser assisted in situ keratomileusis 07/25/2012  . Hx of transesophageal echocardiography (TEE) for monitoring    TEE (03/2014): No LAA clot, moderate LAE, core triatriatum type structure in RA with no stenosis, normal EF 55-60%, mild MR  . HYPERCHOLESTEROLEMIA 10/13/2010   Qualifier: Diagnosis of  By: Nils Pyle CMA ([place]), Mearl Latin    . Hypertension   . Hypothyroidism, postsurgical   . Irritable bowel syndrome 10/13/2010   Qualifier: Diagnosis of  By: Nils Pyle CMA (AAMA), Mearl Latin    . Lumbar stenosis with neurogenic claudication 04/20/2016  . Malignant neoplasm of breast ([place]) 02/25/2016  . Migraine    "last one was years ago" (03/31/2014)  . MIGRAINE HEADACHE 10/13/2010   Qualifier: Diagnosis of  By: Nils Pyle CMA ([place]), Mearl Latin    . 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 OSA on CPAP    settings at 10-11   . OSTEOARTHRITIS 10/13/2010   Qualifier: Diagnosis of  By: Nils Pyle CMA ([place]), Mearl Latin    . Post-surgical hypothyroidism 04/09/2014  . RECTAL FISSURE 10/13/2010   Qualifier: Diagnosis of  By: Nils Pyle CMA ([place]), Mearl Latin    . S/P Nissen fundoplication (without gastrostomy tube) procedure 05/25/2017  . Sleep apnea   . THYROID CANCER 10/13/2010   Qualifier: Diagnosis of  By: Nils Pyle CMA ([place]), Mearl Latin    . Thyroid cancer [facility])    s/p thyroidectomy  . Type II diabetes mellitus ([place])    type II    Past Surgical History:  Procedure Laterality Date  . ABDOMINAL HYSTERECTOMY     "partial"  . BREAST BIOPSY Right   . BREAST LUMPECTOMY Right   .  CARDIOVERSION N/A 03/30/2014   Procedure: CARDIOVERSION - BEDSIDE;  Surgeon: [name], MD;  Location: [place];  Service: Cardiovascular;  Laterality: N/A;  . CARDIOVERSION N/A 03/31/2014   Procedure: CARDIOVERSION  (BEDSIDE) ;  Surgeon: Lelon Perla, MD;  Location: [place];  Service: Cardiovascular;  Laterality: N/A;  . CARDIOVERSION N/A 04/27/2014   Procedure: CARDIOVERSION;  Surgeon: Darlin Coco, MD;  Location: [facility] ENDOSCOPY;  Service: Cardiovascular;  Laterality: N/A;  . CARDIOVERSION N/A 04/13/2015   Procedure: CARDIOVERSION;  Surgeon: Arnette Norris  Deboraha Sprang, MD;  Location: [place];  Service: Cardiovascular;  Laterality: N/A;  . CARDIOVERSION N/A 01/08/2017   Procedure: CARDIOVERSION;  Surgeon: Sanda Klein, MD;  Location: [place] ENDOSCOPY;  Service: Cardiovascular;  Laterality: N/A;  . CARDIOVERSION N/A 09/19/2017   Procedure: CARDIOVERSION;  Surgeon: [name], MD;  Location: [place];  Service: Cardiovascular;  Laterality: N/A;  . CARDIOVERSION N/A 10/25/2017   Procedure: CARDIOVERSION;  Surgeon: Thayer Headings, MD;  Location: [place];  Service: Cardiovascular;  Laterality: N/A;  . CARDIOVERSION N/A 12/27/2018   Procedure: CARDIOVERSION;  Surgeon: [name], MD;  Location: [facility] ENDOSCOPY;  Service: Cardiovascular;  Laterality: N/A;  . CARDIOVERSION N/A 05/20/2019   Procedure: CARDIOVERSION;  Surgeon: [name], MD;  Location: [facility] ENDOSCOPY;  Service: Cardiovascular;  Laterality: N/A;  . CARDIOVERSION N/A 12/10/2019   Procedure: CARDIOVERSION;  Surgeon: Pixie Casino, MD;  Location: [facility] ENDOSCOPY;  Service: Cardiovascular;  Laterality: N/A;  . CARDIOVERSION N/A 12/31/2019   Procedure: CARDIOVERSION;  Surgeon: Sanda Klein, MD;  Location: [facility] ENDOSCOPY;  Service: Cardiovascular;  Laterality: N/A;  . CARPAL TUNNEL RELEASE Right   . CATARACT EXTRACTION W/ INTRAOCULAR LENS  IMPLANT, BILATERAL Bilateral   . COLONOSCOPY    . EXCISIONAL HEMORRHOIDECTOMY    .  INSERTION OF MESH N/A 05/25/2017   Procedure: INSERTION OF MESH;  Surgeon: Ralene Ok, MD;  Location: [facility];  Service: General;  Laterality: N/A;  . TEE WITHOUT CARDIOVERSION N/A 03/30/2014   Procedure: TRANSESOPHAGEAL ECHOCARDIOGRAM (TEE);  Surgeon: [name], MD;  Location: [facility] ENDOSCOPY;  Service: Cardiovascular;  Laterality: N/A;  . THYROIDECTOMY    . TONSILLECTOMY      Current Outpatient Medications  Medication Sig Dispense Refill  . ACCU-CHEK GUIDE test strip     . acetaminophen (TYLENOL) 650 MG CR tablet Take 650 mg by mouth every 8 (eight) hours.     Marland Kitchen amiodarone (PACERONE) 200 MG tablet Take 200 mg by mouth daily.    Marland Kitchen apixaban (ELIQUIS) 5 MG TABS tablet Take 1 tablet (5 mg total) by mouth 2 (two) times daily. 180 tablet 2  . Blood Glucose Monitoring Suppl (ACCU-CHEK GUIDE ME) w/Device KIT     . calcium carbonate (OSCAL) 1500 (600 Ca) MG TABS tablet Take 600 mg of elemental calcium by mouth daily.    . cetirizine (ZYRTEC) 10 MG tablet Take 10 mg by mouth daily.     . Cholecalciferol (DIALYVITE VITAMIN D 5000) 125 MCG (5000 UT) capsule Take 5,000 Units by mouth daily.    . ferrous sulfate 325 (65 FE) MG tablet Take 325 mg by mouth daily with breakfast.    . fluticasone (FLONASE) 50 MCG/ACT nasal spray Place 1 spray into both nostrils at bedtime.     . lansoprazole (PREVACID) 30 MG capsule Take 30 mg by mouth daily.     Marland Kitchen levothyroxine (SYNTHROID) 137 MCG tablet Take 137 mcg by mouth daily.     Marland Kitchen LORazepam (ATIVAN) 1 MG tablet Take 1.5 mg by mouth at bedtime.     . Lutein 6 MG CAPS Take 6 mg by mouth once a week.     . metFORMIN (GLUCOPHAGE-XR) 500 MG 24 hr tablet Take 500 mg by mouth 2 (two) times daily.     . metoprolol tartrate (LOPRESSOR) 25 MG tablet Take 0.5 tablets (12.5 mg total) by mouth 2 (two) times daily. 180 tablet 2  . Multiple Vitamins-Minerals (PRESERVISION AREDS 2) CAPS Take 1 capsule by mouth daily.    Marland Kitchen  Multiple Vitamins-Minerals (ZINC PO) Take 0.5  tablets by mouth 2 (two) times a week.     . Polyvinyl Alcohol-Povidone PF (REFRESH) 1.4-0.6 % SOLN Place 1 drop into both eyes daily as needed (dry eyes).     . potassium chloride SA (KLOR-CON) 20 MEQ tablet Take 1 tablet (20 mEq total) by mouth daily. 90 tablet 2  . rosuvastatin (CRESTOR) 10 MG tablet Take 10 mg by mouth at bedtime.    . sertraline (ZOLOFT) 50 MG tablet Take 50 mg by mouth daily.    . vitamin B-12 (CYANOCOBALAMIN) 1000 MCG tablet Take 1,000 mcg by mouth as needed (energy).      No current facility-administered medications for this encounter.    Allergies  Allergen Reactions  . Penicillins Itching, Rash and Other (See Comments)    Did it involve swelling of the face/tongue/throat, SOB, or low BP? No Did it involve sudden or severe rash/hives, skin peeling, or any reaction on the inside of your mouth or nose? No Did you need to seek medical attention at a hospital or doctor's office? No When did it last happen?30+ years If all above answers are "NO", may proceed with cephalosporin use.     Social History   Socioeconomic History  . Marital status: Divorced    Spouse name: Not on file  . Number of children: 2  . Years of education: Not on file  . Highest education level: Not on file  Occupational History  . Occupation: Retired    Comment: Education officer, museum  Tobacco Use  . Smoking status: Never Smoker  . Smokeless tobacco: Never Used  Substance and Sexual Activity  . Alcohol use: No    Alcohol/week: 0.0 standard drinks  . Drug use: No  . Sexual activity: Never  Other Topics Concern  . Not on file  Social History Narrative  . Not on file   Social Determinants of Health   Financial Resource Strain:   . Difficulty of Paying Living Expenses: Not on file  Food Insecurity:   . Worried About Charity fundraiser in the Last Year: Not on file  . Ran Out of Food in the Last Year: Not on file  Transportation Needs:   . Lack of Transportation (Medical): Not  on file  . Lack of Transportation (Non-Medical): Not on file  Physical Activity:   . Days of Exercise per Week: Not on file  . Minutes of Exercise per Session: Not on file  Stress:   . Feeling of Stress : Not on file  Social Connections:   . Frequency of Communication with Friends and Family: Not on file  . Frequency of Social Gatherings with Friends and Family: Not on file  . Attends Religious Services: Not on file  . Active Member of Clubs or Organizations: Not on file  . Attends Archivist Meetings: Not on file  . Marital Status: Not on file  Intimate Partner Violence:   . Fear of Current or Ex-Partner: Not on file  . Emotionally Abused: Not on file  . Physically Abused: Not on file  . Sexually Abused: Not on file    Family History  Problem Relation Age of Onset  . Prostate cancer Father   . Diabetes Sister   . Heart disease Sister   . Heart attack Brother   . Heart disease Brother   . Diabetes Brother   . Diabetes Sister   . Diabetes Sister   . Pancreatic cancer Sister   . Diabetes  Brother   . Heart disease Brother   . Diabetes Brother   . Heart disease Brother   . Diabetes Brother   . Heart disease Brother   . Diabetes Brother   . Heart disease Brother   . Diabetes Brother   . Heart disease Brother   . Diabetes Brother   . Diabetes Child     ROS- All systems are reviewed and negative except as per the HPI above  Physical Exam: Vitals:   01/15/20 1523  BP: (!) 142/80  Pulse: (!) 48  Weight: 82.6 kg  Height: 5' 6"  (1.676 m)   Wt Readings from Last 3 Encounters:  01/15/20 82.6 kg  01/07/20 83.1 kg  12/31/19 81.6 kg    Labs: Lab Results  Component Value Date   NA 139 12/03/2019   K 4.4 12/03/2019   CL 106 12/03/2019   CO2 21 (L) 12/03/2019   GLUCOSE 120 (H) 12/03/2019   BUN 18 12/03/2019   CREATININE 1.11 (H) 12/03/2019   CALCIUM 9.1 12/03/2019   MG 2.2 06/07/2017   Lab Results  Component Value Date   INR 1.12 04/20/2016   No  results found for: CHOL, HDL, LDLCALC, TRIG   GEN- The patient is well appearing, alert and oriented x 3 today.   Head- normocephalic, atraumatic Eyes-  Sclera clear, conjunctiva pink Ears- hearing intact Oropharynx- clear Neck- supple, no JVP Lymph- no cervical lymphadenopathy Lungs- Clear to ausculation bilaterally, normal work of breathing Heart- regular rate and rhythm, no murmurs, rubs or gallops, PMI not laterally displaced GI- soft, NT, ND, + BS Extremities- no clubbing, cyanosis, or edema MS- no significant deformity or atrophy Skin- no rash or lesion Psych- euthymic mood, full affect Neuro- strength and sensation are intact  EKG-sinus brady at 48 bpm, pr int 200 ms, qrs int 94 bpm, qtc 464 ms Epic records reviewed   Assessment and Plan: 1. Persistent afib Multiple cardioversion's in the past  Last cardioversion, 2/17, was successful but now with weakness 2/2 bradycardia Decrease dose of amiodarone back to one tablet, 200 mg  a day and decrease metoprolol back to 12.5 mg bid   2. HTN Stable  3. CHA2DS2VASc score of 6 Eliquis 5 mg bid   F/u in afib clinic one week    Butch Penny C. Jerrard [place], [facility] [address] [place], [place] [address] [phone]

## 2020-01-15 NOTE — Patient Instructions (Signed)
Decrease amiodarone to 200mg  once a day  Decrease metoprolol to 1/2 tablet twice a day

## 2020-01-22 ENCOUNTER — Other Ambulatory Visit: Payer: Self-pay

## 2020-01-22 ENCOUNTER — Ambulatory Visit (HOSPITAL_COMMUNITY)
Admission: RE | Admit: 2020-01-22 | Discharge: 2020-01-22 | Disposition: A | Discharge status: Home / Self Care | Payer: Medicare PPO | Source: Ambulatory Visit | Attending: Nurse Practitioner | Admitting: Nurse Practitioner

## 2020-01-22 ENCOUNTER — Ambulatory Visit (HOSPITAL_COMMUNITY): Payer: Medicare PPO | Admitting: Nurse Practitioner

## 2020-01-22 ENCOUNTER — Encounter (HOSPITAL_COMMUNITY): Payer: Self-pay | Admitting: Nurse Practitioner

## 2020-01-22 VITALS — BP 152/78 | HR 61 | Ht 66.0 in | Wt 182.2 lb

## 2020-01-22 DIAGNOSIS — Z9841 Cataract extraction status, right eye: Secondary | ICD-10-CM | POA: Insufficient documentation

## 2020-01-22 DIAGNOSIS — Z87440 Personal history of urinary (tract) infections: Secondary | ICD-10-CM | POA: Diagnosis not present

## 2020-01-22 DIAGNOSIS — Z7901 Long term (current) use of anticoagulants: Secondary | ICD-10-CM | POA: Insufficient documentation

## 2020-01-22 DIAGNOSIS — E7800 Pure hypercholesterolemia, unspecified: Secondary | ICD-10-CM | POA: Diagnosis not present

## 2020-01-22 DIAGNOSIS — Z90711 Acquired absence of uterus with remaining cervical stump: Secondary | ICD-10-CM | POA: Insufficient documentation

## 2020-01-22 DIAGNOSIS — Z880 Allergy status to penicillin: Secondary | ICD-10-CM | POA: Insufficient documentation

## 2020-01-22 DIAGNOSIS — E890 Postprocedural hypothyroidism: Secondary | ICD-10-CM | POA: Diagnosis not present

## 2020-01-22 DIAGNOSIS — K219 Gastro-esophageal reflux disease without esophagitis: Secondary | ICD-10-CM | POA: Diagnosis not present

## 2020-01-22 DIAGNOSIS — Z9842 Cataract extraction status, left eye: Secondary | ICD-10-CM | POA: Diagnosis not present

## 2020-01-22 DIAGNOSIS — Z961 Presence of intraocular lens: Secondary | ICD-10-CM | POA: Insufficient documentation

## 2020-01-22 DIAGNOSIS — Z7984 Long term (current) use of oral hypoglycemic drugs: Secondary | ICD-10-CM | POA: Insufficient documentation

## 2020-01-22 DIAGNOSIS — D6869 Other thrombophilia: Secondary | ICD-10-CM | POA: Diagnosis not present

## 2020-01-22 DIAGNOSIS — I4819 Other persistent atrial fibrillation: Secondary | ICD-10-CM | POA: Insufficient documentation

## 2020-01-22 DIAGNOSIS — I10 Essential (primary) hypertension: Secondary | ICD-10-CM | POA: Diagnosis not present

## 2020-01-22 DIAGNOSIS — Z79890 Hormone replacement therapy (postmenopausal): Secondary | ICD-10-CM | POA: Diagnosis not present

## 2020-01-22 DIAGNOSIS — Z79899 Other long term (current) drug therapy: Secondary | ICD-10-CM | POA: Insufficient documentation

## 2020-01-22 NOTE — Progress Notes (Signed)
Primary Care Physician: Lavone Orn, MD Cardiologist: [name] Pulmonologist: Dr. Elsworth Soho GI: Dr. Dolphus Jenny I Spiewak is a 86 y.o. female with a h/o afib that has been on amiodarone since June of 2015. She has had many breakthrough events, last one in February with successful cardioversion. She does feel very weak in afib. She went into afib around 2 weeks ago and a RN came out to her house on Friday and documented the afib.  Dr. Rayann Heman was called and left instructions to increase amio to 200 mg bid and stop losartan. Apparently her BP was low on that day.  She is in now in the afib clinc and she is rate controlled and BP is stable. She reports around 2-3 weeks ago she first had 2 teeth pulled and then a shot of cortisone in her knee. She hasn't felt well since. She usually requires a cardioversion to restore SR.    F/u in afib clinic 7/15 after successful cardioversion. She is in SR today and feels improved. She is back to Amio 200 mg daily.  F/u in afib clinic, 11/25/19, with feelings of not being well and being out of rhythm x one week. She has had an UTI and just finished a round of doxycycline. EKG shows atrial flutter  variable rate at 110 bpm.CHA2DS2-VASc Score = 6, continues eliquis 5 mg bid. 1   F/u in  afib clinic, 12/16/18. She had a successful cardioversion but unfortunately felt like it only lasted one day. She is in afib in the 90's. I will try to increase BB and keep amio  at 200 mg a little longer to see if she may return to [place].   F/u  in afib clinic, 01/15/20.  F/u cardioversion 2/17. She was kept on her higher dose of BB at time of cardioversion as she did covert out but with PAC's. She saw [name] one week out and was staying in [place]. She  was left on amiodarone 200 mg bid until f/u here. She called the office earlier in the week and c/o of feeling weak. EKG shows  Sinus brady at 48 bpm. I feel her bradycardia is the reason for her weakness and will titrate meds down today.    F/u 01/22/20. She remains  in SR  in the low 60's but does not feel that much better.  Back on amiodarone 200 mg daily from  BID and lower dose of BB. Marland Kitchen She had some GI distress yesterday and recently had issues with UTI. She  denies urgency or frequency today.  Today, she denies symptoms of chest pain, shortness of breath, orthopnea, PND, lower extremity edema, dizziness, presyncope, syncope, or neurologic sequela.Perisistent cough. The patient is tolerating medications without difficulties and is otherwise without complaint today.   Past Medical History:  Diagnosis Date  . Acute bronchitis 11/15/2017  . ADENOCARCINOMA, BREAST 10/13/2010   Qualifier: Diagnosis of  By: Nils Pyle CMA (AAMA), Mearl Latin    . 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 Allergy    SEASONAL  . Anemia    years ago  . ANEMIA, IRON DEFICIENCY 10/14/2010   Qualifier: Diagnosis of  By: Sharlett Iles MD Byrd Hesselbach   . Anemia, unspecified 10/13/2010   Qualifier: Diagnosis of  By: Nils Pyle CMA ([place]), Mearl Latin    . Anticoagulated by anticoagulation treatment - Eliquis 03/30/2014   Started May 2015   . Anxiety state 10/13/2010   Qualifier: Diagnosis of  By: Nils Pyle CMA ([place]), Mearl Latin    .  Arthritis    "all my joints" (03/31/2014)  . Atrial fibrillation ([place])   . Atrial fibrillation with RVR ([place]) 03/31/2015   DLCO dropped from 100% in 2015-50% in 01/2017  . Barrett's esophagus   . Breast cancer [facility])    s/p right breast lumpectomy  . CARDIAC MURMUR 10/13/2010   Qualifier: Diagnosis of  By: Nils Pyle CMA ([place]), Mearl Latin    . 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 Cervical cancer ([place])   . Chronic cough 01/02/2017   GERD, hiatal hernia Doubt amio  . COLONIC POLYPS, ADENOMATOUS, HX OF 10/13/2010   Qualifier: Diagnosis of  By: Nils Pyle CMA (AAMA), Mearl Latin    . Degenerative disorder of eye 07/25/2012  . Diabetes mellitus without complication ([place])   . Dilated cardiomyopathy ([place]) 08/27/2018  . DIVERTICULOSIS, COLON 10/13/2010    Qualifier: Diagnosis of  By: Nils Pyle CMA (AAMA), Mearl Latin    . DJD (degenerative joint disease) of knee   . Dysrhythmia    Afib  . EROSIVE ESOPHAGITIS 10/13/2010   Qualifier: Diagnosis of  By: Nils Pyle CMA ([place]), Mearl Latin    . Error, refractive, myopia 07/09/2012  . Essential hypertension 10/13/2010   Qualifier: Diagnosis of  By: Nils Pyle CMA ([place]), Mearl Latin    . Exotropia, intermittent 07/09/2012  . GERD 10/14/2010   Qualifier: Diagnosis of  By: Sharlett Iles MD Byrd Hesselbach GERD (gastroesophageal reflux disease)   . Glaucoma   . H/O hiatal hernia   . Heart murmur   . Hiatal hernia s/p robotic repair & fundoplication 05/15/5008 38/11/8297   Qualifier: Diagnosis of  By: Nils Pyle CMA (AAMA), Mearl Latin    . History of laser assisted in situ keratomileusis 07/25/2012  . Hx of transesophageal echocardiography (TEE) for monitoring    TEE (03/2014): No LAA clot, moderate LAE, core triatriatum type structure in RA with no stenosis, normal EF 55-60%, mild MR  . HYPERCHOLESTEROLEMIA 10/13/2010   Qualifier: Diagnosis of  By: Nils Pyle CMA ([place]), Mearl Latin    . Hypertension   . Hypothyroidism, postsurgical   . Irritable bowel syndrome 10/13/2010   Qualifier: Diagnosis of  By: Nils Pyle CMA (AAMA), Mearl Latin    . Lumbar stenosis with neurogenic claudication 04/20/2016  . Malignant neoplasm of breast ([place]) 02/25/2016  . Migraine    "last one was years ago" (03/31/2014)  . MIGRAINE HEADACHE 10/13/2010   Qualifier: Diagnosis of  By: Nils Pyle CMA ([place]), Mearl Latin    . 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 OSA on CPAP    settings at 10-11   . OSTEOARTHRITIS 10/13/2010   Qualifier: Diagnosis of  By: Nils Pyle CMA ([place]), Mearl Latin    . Post-surgical hypothyroidism 04/09/2014  . RECTAL FISSURE 10/13/2010   Qualifier: Diagnosis of  By: Nils Pyle CMA ([place]), Mearl Latin    . S/P Nissen fundoplication (without gastrostomy tube) procedure 05/25/2017  . Sleep apnea   . THYROID CANCER 10/13/2010   Qualifier: Diagnosis of  By: Nils Pyle CMA ([place]),  Mearl Latin    . Thyroid cancer [facility])    s/p thyroidectomy  . Type II diabetes mellitus ([place])    type II    Past Surgical History:  Procedure Laterality Date  . ABDOMINAL HYSTERECTOMY     "partial"  . BREAST BIOPSY Right   . BREAST LUMPECTOMY Right   . CARDIOVERSION N/A 03/30/2014   Procedure: CARDIOVERSION - BEDSIDE;  Surgeon: [name], MD;  Location: [facility];  Service: Cardiovascular;  Laterality: N/A;  . CARDIOVERSION N/A 03/31/2014   Procedure: CARDIOVERSION  (BEDSIDE) ;  Surgeon: Lelon Perla, MD;  Location: [facility];  Service: Cardiovascular;  Laterality: N/A;  . CARDIOVERSION N/A 04/27/2014   Procedure: CARDIOVERSION;  Surgeon: Darlin Coco, MD;  Location: [place];  Service: Cardiovascular;  Laterality: N/A;  . CARDIOVERSION N/A 04/13/2015   Procedure: CARDIOVERSION;  Surgeon: Thayer Headings, MD;  Location: [place];  Service: Cardiovascular;  Laterality: N/A;  . CARDIOVERSION N/A 01/08/2017   Procedure: CARDIOVERSION;  Surgeon: Sanda Klein, MD;  Location: [facility] ENDOSCOPY;  Service: Cardiovascular;  Laterality: N/A;  . CARDIOVERSION N/A 09/19/2017   Procedure: CARDIOVERSION;  Surgeon: [name], MD;  Location: [place];  Service: Cardiovascular;  Laterality: N/A;  . CARDIOVERSION N/A 10/25/2017   Procedure: CARDIOVERSION;  Surgeon: Acie Fredrickson Wonda Cheng, MD;  Location: [place];  Service: Cardiovascular;  Laterality: N/A;  . CARDIOVERSION N/A 12/27/2018   Procedure: CARDIOVERSION;  Surgeon: [name], MD;  Location: [facility];  Service: Cardiovascular;  Laterality: N/A;  . CARDIOVERSION N/A 05/20/2019   Procedure: CARDIOVERSION;  Surgeon: [name], MD;  Location[facility] ENDOSCOPY;  Service: Cardiovascular;  Laterality: N/A;  . CARDIOVERSION N/A 12/10/2019   Procedure: CARDIOVERSION;  Surgeon: Pixie Casino, MD;  [facility];  Service: Cardiovascular;  Laterality: N/A;  . CARDIOVERSION N/A 12/31/2019   Procedure: CARDIOVERSION;   Surgeon: Sanda Klein, MD;  Location: [facility] ENDOSCOPY;  Service: Cardiovascular;  Laterality: N/A;  . CARPAL TUNNEL RELEASE Right   . CATARACT EXTRACTION W/ INTRAOCULAR LENS  IMPLANT, BILATERAL Bilateral   . COLONOSCOPY    . EXCISIONAL HEMORRHOIDECTOMY    . INSERTION OF MESH N/A 05/25/2017   Procedure: INSERTION OF MESH;  Surgeon: Ralene Ok, MD;  Location: [facility];  Service: General;  Laterality: N/A;  . TEE WITHOUT CARDIOVERSION N/A 03/30/2014   Procedure: TRANSESOPHAGEAL ECHOCARDIOGRAM (TEE);  Surgeon: [name], MD;  Location: [facility] ENDOSCOPY;  Service: Cardiovascular;  Laterality: N/A;  . THYROIDECTOMY    . TONSILLECTOMY      Current Outpatient Medications  Medication Sig Dispense Refill  . ACCU-CHEK GUIDE test strip     . acetaminophen (TYLENOL) 650 MG CR tablet Take 650 mg by mouth every 8 (eight) hours.     Marland Kitchen amiodarone (PACERONE) 200 MG tablet Take 200 mg by mouth daily.    Marland Kitchen apixaban (ELIQUIS) 5 MG TABS tablet Take 1 tablet (5 mg total) by mouth 2 (two) times daily. 180 tablet 2  . Blood Glucose Monitoring Suppl (ACCU-CHEK GUIDE ME) w/Device KIT     . calcium carbonate (OSCAL) 1500 (600 Ca) MG TABS tablet Take 600 mg of elemental calcium by mouth daily.    . cetirizine (ZYRTEC) 10 MG tablet Take 10 mg by mouth daily.     . Cholecalciferol (DIALYVITE VITAMIN D 5000) 125 MCG (5000 UT) capsule Take 5,000 Units by mouth daily.    . ferrous sulfate 325 (65 FE) MG tablet Take 325 mg by mouth daily with breakfast.    . fluticasone (FLONASE) 50 MCG/ACT nasal spray Place 1 spray into both nostrils at bedtime.     . lansoprazole (PREVACID) 30 MG capsule Take 30 mg by mouth daily.     Marland Kitchen levothyroxine (SYNTHROID) 137 MCG tablet Take 137 mcg by mouth daily.     Marland Kitchen LORazepam (ATIVAN) 1 MG tablet Take 1.5 mg by mouth at bedtime.     . Lutein 6 MG CAPS Take 6 mg by mouth once a week.     . metFORMIN (GLUCOPHAGE-XR) 500 MG 24 hr tablet Take 500  mg by mouth 2 (two) times daily.     .  metoprolol tartrate (LOPRESSOR) 25 MG tablet Take 0.5 tablets (12.5 mg total) by mouth 2 (two) times daily. 180 tablet 2  . Multiple Vitamins-Minerals (PRESERVISION AREDS 2) CAPS Take 1 capsule by mouth daily.    . Multiple Vitamins-Minerals (ZINC PO) Take 0.5 tablets by mouth 2 (two) times a week.     . Polyvinyl Alcohol-Povidone PF (REFRESH) 1.4-0.6 % SOLN Place 1 drop into both eyes daily as needed (dry eyes).     . potassium chloride SA (KLOR-CON) 20 MEQ tablet Take 1 tablet (20 mEq total) by mouth daily. 90 tablet 2  . rosuvastatin (CRESTOR) 10 MG tablet Take 10 mg by mouth at bedtime.    . sertraline (ZOLOFT) 50 MG tablet Take 50 mg by mouth daily.    . vitamin B-12 (CYANOCOBALAMIN) 1000 MCG tablet Take 1,000 mcg by mouth as needed (energy).      No current facility-administered medications for this encounter.    Allergies  Allergen Reactions  . Penicillins Itching, Rash and Other (See Comments)    Did it involve swelling of the face/tongue/throat, SOB, or low BP? No Did it involve sudden or severe rash/hives, skin peeling, or any reaction on the inside of your mouth or nose? No Did you need to seek medical attention at a hospital or doctor's office? No When did it last happen?30+ years If all above answers are "NO", may proceed with cephalosporin use.     Social History   Socioeconomic History  . Marital status: Divorced    Spouse name: Not on file  . Number of children: 2  . Years of education: Not on file  . Highest education level: Not on file  Occupational History  . Occupation: Retired    Comment: Education officer, museum  Tobacco Use  . Smoking status: Never Smoker  . Smokeless tobacco: Never Used  Substance and Sexual Activity  . Alcohol use: No    Alcohol/week: 0.0 standard drinks  . Drug use: No  . Sexual activity: Never  Other Topics Concern  . Not on file  Social History Narrative  . Not on file   Social Determinants of Health   Financial Resource  Strain:   . Difficulty of Paying Living Expenses:   Food Insecurity:   . Worried About Charity fundraiser in the Last Year:   . Arboriculturist in the Last Year:   Transportation Needs:   . Film/video editor (Medical):   Marland Kitchen Lack of Transportation (Non-Medical):   Physical Activity:   . Days of Exercise per Week:   . Minutes of Exercise per Session:   Stress:   . Feeling of Stress :   Social Connections:   . Frequency of Communication with Friends and Family:   . Frequency of Social Gatherings with Friends and Family:   . Attends Religious Services:   . Active Member of Clubs or Organizations:   . Attends Archivist Meetings:   Marland Kitchen Marital Status:   Intimate Partner Violence:   . Fear of Current or Ex-Partner:   . Emotionally Abused:   Marland Kitchen Physically Abused:   . Sexually Abused:     Family History  Problem Relation Age of Onset  . Prostate cancer Father   . Diabetes Sister   . Heart disease Sister   . Heart attack Brother   . Heart disease Brother   . Diabetes Brother   . Diabetes Sister   .  Diabetes Sister   . Pancreatic cancer Sister   . Diabetes Brother   . Heart disease Brother   . Diabetes Brother   . Heart disease Brother   . Diabetes Brother   . Heart disease Brother   . Diabetes Brother   . Heart disease Brother   . Diabetes Brother   . Heart disease Brother   . Diabetes Brother   . Diabetes Child     ROS- All systems are reviewed and negative except as per the HPI above  Physical Exam: Vitals:   01/22/20 1454  BP: (!) 152/78  Pulse: 61  Weight: 82.6 kg  Height: 5' 6"  (1.676 m)   Wt Readings from Last 3 Encounters:  01/22/20 82.6 kg  01/15/20 82.6 kg  01/07/20 83.1 kg    Labs: Lab Results  Component Value Date   NA 139 12/03/2019   K 4.4 12/03/2019   CL 106 12/03/2019   CO2 21 (L) 12/03/2019   GLUCOSE 120 (H) 12/03/2019   BUN 18 12/03/2019   CREATININE 1.11 (H) 12/03/2019   CALCIUM 9.1 12/03/2019   MG 2.2 06/07/2017    Lab Results  Component Value Date   INR 1.12 04/20/2016   No results found for: CHOL, HDL, LDLCALC, TRIG   GEN- The patient is well appearing, alert and oriented x 3 today.   Head- normocephalic, atraumatic Eyes-  Sclera clear, conjunctiva pink Ears- hearing intact Oropharynx- clear Neck- supple, no JVP Lymph- no cervical lymphadenopathy Lungs- Clear to ausculation bilaterally, normal work of breathing Heart- regular rate and rhythm, no murmurs, rubs or gallops, PMI not laterally displaced GI- soft, NT, ND, + BS Extremities- no clubbing, cyanosis, or edema MS- no significant deformity or atrophy Skin- no rash or lesion Psych- euthymic mood, full affect Neuro- strength and sensation are intact  EKG-sinus rhythm at 61 bpm, pr int  178 ms, qrs int  102  bpm, qtc 484 ms Epic records reviewed   Assessment and Plan: 1. Persistent afib Multiple cardioversion's in the past  Last cardioversion, 2/17, was successful but  with weakness 2/2 bradycardia Sinus brady resolved with reduction of BB and amiodarone Continue amiodarone at 200 mg qd Continue  BB at 12.5 mg bid  May take another week or two to notice some improvement of amio washout from higher dose  If continues to  feel weak, f/u with PCP, has frequent UTI's  Last TSH was normal   2. HTN Stable  3. CHA2DS2VASc score of 6 Eliquis 5 mg bid   F/u in afib clinic in 3 months    Butch Penny C. Tarl Cephas, [facility] [address] [place], [place] [address] [phone]

## 2020-02-11 ENCOUNTER — Ambulatory Visit (HOSPITAL_COMMUNITY)
Admission: RE | Admit: 2020-02-11 | Discharge: 2020-02-11 | Disposition: A | Discharge status: Home / Self Care | Payer: Medicare PPO | Source: Ambulatory Visit | Attending: Nurse Practitioner | Admitting: Nurse Practitioner

## 2020-02-11 ENCOUNTER — Other Ambulatory Visit: Payer: Self-pay

## 2020-02-11 VITALS — BP 116/82 | HR 114 | Ht 66.0 in | Wt 179.4 lb

## 2020-02-11 DIAGNOSIS — I10 Essential (primary) hypertension: Secondary | ICD-10-CM | POA: Insufficient documentation

## 2020-02-11 DIAGNOSIS — E119 Type 2 diabetes mellitus without complications: Secondary | ICD-10-CM | POA: Insufficient documentation

## 2020-02-11 DIAGNOSIS — Z79899 Other long term (current) drug therapy: Secondary | ICD-10-CM | POA: Diagnosis not present

## 2020-02-11 DIAGNOSIS — Z792 Long term (current) use of antibiotics: Secondary | ICD-10-CM | POA: Insufficient documentation

## 2020-02-11 DIAGNOSIS — K219 Gastro-esophageal reflux disease without esophagitis: Secondary | ICD-10-CM | POA: Insufficient documentation

## 2020-02-11 DIAGNOSIS — Z853 Personal history of malignant neoplasm of breast: Secondary | ICD-10-CM | POA: Insufficient documentation

## 2020-02-11 DIAGNOSIS — I4819 Other persistent atrial fibrillation: Secondary | ICD-10-CM | POA: Insufficient documentation

## 2020-02-11 DIAGNOSIS — Z79890 Hormone replacement therapy (postmenopausal): Secondary | ICD-10-CM | POA: Insufficient documentation

## 2020-02-11 DIAGNOSIS — D6869 Other thrombophilia: Secondary | ICD-10-CM

## 2020-02-11 DIAGNOSIS — Z7984 Long term (current) use of oral hypoglycemic drugs: Secondary | ICD-10-CM | POA: Diagnosis not present

## 2020-02-11 MED ORDER — METOPROLOL TARTRATE 25 MG PO TABS: 25.0000 mg | ORAL_TABLET | Freq: Two times a day (BID) | ORAL | 2 refills | Status: DC

## 2020-02-11 NOTE — Patient Instructions (Signed)
Stop amiodarone  Increase metoprolol to 1 tablet twice a day (25mg  twice a day)

## 2020-02-12 ENCOUNTER — Encounter (HOSPITAL_COMMUNITY): Payer: Self-pay | Admitting: Nurse Practitioner

## 2020-02-12 NOTE — Progress Notes (Signed)
Primary Care Physician: Lavone Orn, MD Cardiologist: [name] Pulmonologist: Dr. Elsworth Soho GI: Dr. Dolphus Jenny [name] is a 86 y.o. female with a h/o afib that has been on amiodarone since June of 2015. She has had many breakthrough events, last one in February with successful cardioversion. She does feel very weak in afib. She went into afib around 2 weeks ago and a RN came out to her house on Friday and documented the afib.  Dr. Rayann Heman was called and left instructions to increase amio to 200 mg bid and stop losartan. Apparently her BP was low on that day.  She is in now in the afib clinc and she is rate controlled and BP is stable. She reports around 2-3 weeks ago she first had 2 teeth pulled and then a shot of cortisone in her knee. She hasn't felt well since. She usually requires a cardioversion to restore SR.    F/u in afib clinic 7/15 after successful cardioversion. She is in SR today and feels improved. She is back to Amio 200 mg daily.  F/u in afib clinic, 11/25/19, with feelings of not being well and being out of rhythm x one week. She has had an UTI and just finished a round of doxycycline. EKG shows atrial flutter  variable rate at 110 bpm.CHA2DS2-VASc Score = 6, continues eliquis 5 mg bid. 1   F/u in  afib clinic, 12/16/18. She had a successful cardioversion but unfortunately felt like it only lasted one day. She is in afib in the 90's. Natasha will try to increase BB and keep amio  at 200 mg a little longer to see if she may return to [place].   F/u  in afib clinic, 01/15/20.  F/u cardioversion 2/17. She was kept on her higher dose of BB at time of cardioversion as she did covert out but with PAC's. She saw [name] one week out and was staying in [place]. She  was left on amiodarone 200 mg bid until f/u here. She called the office earlier in the week and c/o of feeling weak. EKG shows  Sinus brady at 48 bpm. Natasha feel her bradycardia is the reason for her weakness and will titrate meds down today.    F/u 01/22/20. She remains  in SR  in the low 60's but does not feel that much better.  Back on amiodarone 200 mg daily from  BID and lower dose of BB. Marland Kitchen She had some GI distress yesterday and recently had issues with UTI. She  denies urgency or frequency today.  F/u in afib clinic, 4/1, as pt has gone back into afib. Discussed with her Natasha feel that amiodarone is no longer effective and Natasha think it is best to stop drug at this point and rate control. If qt shortens off 3 month amiodarone wash out, currently at 512 ms then tikosyn may be an option down the line. She has multiple cardioversions with significant ERAF.  Today, she denies symptoms of chest pain, shortness of breath, orthopnea, PND, lower extremity edema, dizziness, presyncope, syncope, or neurologic sequela.Perisistent cough. The patient is tolerating medications without difficulties and is otherwise without complaint today.   Past Medical History:  Diagnosis Date  . Acute bronchitis 11/15/2017  . ADENOCARCINOMA, BREAST 10/13/2010   Qualifier: Diagnosis of  By: Nils Pyle CMA (AAMA), Mearl Latin    . 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 Allergy    SEASONAL  . Anemia    years ago  .  ANEMIA, IRON DEFICIENCY 10/14/2010   Qualifier: Diagnosis of  By: Sharlett Iles MD Byrd Hesselbach   . Anemia, unspecified 10/13/2010   Qualifier: Diagnosis of  By: Nils Pyle CMA ([place]), Mearl Latin    . Anticoagulated by anticoagulation treatment - Eliquis 03/30/2014   Started May 2015   . Anxiety state 10/13/2010   Qualifier: Diagnosis of  By: Nils Pyle CMA ([place]), Mearl Latin    . Arthritis    "all my joints" (03/31/2014)  . Atrial fibrillation ([place])   . Atrial fibrillation with RVR ([place]) 03/31/2015   DLCO dropped from 100% in 2015-50% in 01/2017  . Barrett's esophagus   . Breast cancer [facility])    s/p right breast lumpectomy  . CARDIAC MURMUR 10/13/2010   Qualifier: Diagnosis of  By: Nils Pyle CMA ([place]), Mearl Latin    . 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 Cervical cancer  ([place])   . Chronic cough 01/02/2017   GERD, hiatal hernia Doubt amio  . COLONIC POLYPS, ADENOMATOUS, HX OF 10/13/2010   Qualifier: Diagnosis of  By: Nils Pyle CMA (AAMA), Mearl Latin    . Degenerative disorder of eye 07/25/2012  . Diabetes mellitus without complication ([place])   . Dilated cardiomyopathy ([place]) 08/27/2018  . DIVERTICULOSIS, COLON 10/13/2010   Qualifier: Diagnosis of  By: Nils Pyle CMA (AAMA), Mearl Latin    . DJD (degenerative joint disease) of knee   . Dysrhythmia    Afib  . EROSIVE ESOPHAGITIS 10/13/2010   Qualifier: Diagnosis of  By: Nils Pyle CMA ([place]), Mearl Latin    . Error, refractive, myopia 07/09/2012  . Essential hypertension 10/13/2010   Qualifier: Diagnosis of  By: Nils Pyle CMA ([place]), Mearl Latin    . Exotropia, intermittent 07/09/2012  . GERD 10/14/2010   Qualifier: Diagnosis of  By: Sharlett Iles MD Byrd Hesselbach GERD (gastroesophageal reflux disease)   . Glaucoma   . H/O hiatal hernia   . Heart murmur   . Hiatal hernia s/p robotic repair & fundoplication 7/67/2094 70/07/6282   Qualifier: Diagnosis of  By: Nils Pyle CMA (AAMA), Mearl Latin    . History of laser assisted in situ keratomileusis 07/25/2012  . Hx of transesophageal echocardiography (TEE) for monitoring    TEE (03/2014): No LAA clot, moderate LAE, core triatriatum type structure in RA with no stenosis, normal EF 55-60%, mild MR  . HYPERCHOLESTEROLEMIA 10/13/2010   Qualifier: Diagnosis of  By: Nils Pyle CMA ([place]), Mearl Latin    . Hypertension   . Hypothyroidism, postsurgical   . Irritable bowel syndrome 10/13/2010   Qualifier: Diagnosis of  By: Nils Pyle CMA (AAMA), Mearl Latin    . Lumbar stenosis with neurogenic claudication 04/20/2016  . Malignant neoplasm of breast ([place]) 02/25/2016  . Migraine    "last one was years ago" (03/31/2014)  . MIGRAINE HEADACHE 10/13/2010   Qualifier: Diagnosis of  By: Nils Pyle CMA ([place]), Mearl Latin    . 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 OSA on CPAP    settings at 10-11   . OSTEOARTHRITIS 10/13/2010    Qualifier: Diagnosis of  By: Nils Pyle CMA ([place]), Mearl Latin    . Post-surgical hypothyroidism 04/09/2014  . RECTAL FISSURE 10/13/2010   Qualifier: Diagnosis of  By: Nils Pyle CMA ([place]), Mearl Latin    . S/P Nissen fundoplication (without gastrostomy tube) procedure 05/25/2017  . Sleep apnea   . THYROID CANCER 10/13/2010   Qualifier: Diagnosis of  By: Nils Pyle CMA ([place]), Mearl Latin    . Thyroid cancer [facility])    s/p thyroidectomy  . Type II diabetes mellitus ([place])    type II  Past Surgical History:  Procedure Laterality Date  . ABDOMINAL HYSTERECTOMY     "partial"  . BREAST BIOPSY Right   . BREAST LUMPECTOMY Right   . CARDIOVERSION N/A 03/30/2014   Procedure: CARDIOVERSION - BEDSIDE;  Surgeon: [name], MD;  Location: [place];  Service: Cardiovascular;  Laterality: N/A;  . CARDIOVERSION N/A 03/31/2014   Procedure: CARDIOVERSION  (BEDSIDE) ;  Surgeon: Lelon Perla, MD;  Location: [place];  Service: Cardiovascular;  Laterality: N/A;  . CARDIOVERSION N/A 04/27/2014   Procedure: CARDIOVERSION;  Surgeon: Darlin Coco, MD;  Location: [place];  Service: Cardiovascular;  Laterality: N/A;  . CARDIOVERSION N/A 04/13/2015   Procedure: CARDIOVERSION;  Surgeon: Thayer Headings, MD;  Location: [place];  Service: Cardiovascular;  Laterality: N/A;  . CARDIOVERSION N/A 01/08/2017   Procedure: CARDIOVERSION;  Surgeon: Sanda Klein, MD;  Location: [facility] ENDOSCOPY;  Service: Cardiovascular;  Laterality: N/A;  . CARDIOVERSION N/A 09/19/2017   Procedure: CARDIOVERSION;  Surgeon: [name], MD;  Location: [place];  Service: Cardiovascular;  Laterality: N/A;  . CARDIOVERSION N/A 10/25/2017   Procedure: CARDIOVERSION;  Surgeon: Acie Fredrickson Wonda Cheng, MD;  Location: [place];  Service: Cardiovascular;  Laterality: N/A;  . CARDIOVERSION N/A 12/27/2018   Procedure: CARDIOVERSION;  Surgeon: [name], MD;  Location: [place];  Service: Cardiovascular;  Laterality: N/A;  . CARDIOVERSION N/A  05/20/2019   Procedure: CARDIOVERSION;  Surgeon: [name], MD;  Location[facility] ENDOSCOPY;  Service: Cardiovascular;  Laterality: N/A;  . CARDIOVERSION N/A 12/10/2019   Procedure: CARDIOVERSION;  Surgeon: Pixie Casino, MD;  Location: [facility] ENDOSCOPY;  Service: Cardiovascular;  Laterality: N/A;  . CARDIOVERSION N/A 12/31/2019   Procedure: CARDIOVERSION;  Surgeon: Sanda Klein, MD;  Location: [facility] ENDOSCOPY;  Service: Cardiovascular;  Laterality: N/A;  . CARPAL TUNNEL RELEASE Right   . CATARACT EXTRACTION W/ INTRAOCULAR LENS  IMPLANT, BILATERAL Bilateral   . COLONOSCOPY    . EXCISIONAL HEMORRHOIDECTOMY    . INSERTION OF MESH N/A 05/25/2017   Procedure: INSERTION OF MESH;  Surgeon: Ralene Ok, MD;  Location: [facility];  Service: General;  Laterality: N/A;  . TEE WITHOUT CARDIOVERSION N/A 03/30/2014   Procedure: TRANSESOPHAGEAL ECHOCARDIOGRAM (TEE);  Surgeon: [name], MD;  Location: [facility] ENDOSCOPY;  Service: Cardiovascular;  Laterality: N/A;  . THYROIDECTOMY    . TONSILLECTOMY      Current Outpatient Medications  Medication Sig Dispense Refill  . ACCU-CHEK GUIDE test strip     . acetaminophen (TYLENOL) 650 MG CR tablet Take 650 mg by mouth every 8 (eight) hours.     Marland Kitchen apixaban (ELIQUIS) 5 MG TABS tablet Take 1 tablet (5 mg total) by mouth 2 (two) times daily. 180 tablet 2  . Blood Glucose Monitoring Suppl (ACCU-CHEK GUIDE ME) w/Device KIT     . calcium carbonate (OSCAL) 1500 (600 Ca) MG TABS tablet Take 600 mg of elemental calcium by mouth daily.    . cetirizine (ZYRTEC) 10 MG tablet Take 10 mg by mouth daily.     . Cholecalciferol (DIALYVITE VITAMIN D 5000) 125 MCG (5000 UT) capsule Take 5,000 Units by mouth daily.    . ferrous sulfate 325 (65 FE) MG tablet Take 325 mg by mouth daily with breakfast.    . fluticasone (FLONASE) 50 MCG/ACT nasal spray Place 1 spray into both nostrils at bedtime.     . lansoprazole (PREVACID) 30 MG capsule Take 30 mg by mouth daily.     Marland Kitchen  levothyroxine (SYNTHROID) 137 MCG tablet  Take 137 mcg by mouth daily.     Marland Kitchen LORazepam (ATIVAN) 1 MG tablet Take 1.5 mg by mouth at bedtime.     . Lutein 6 MG CAPS Take 6 mg by mouth once a week.     . metFORMIN (GLUCOPHAGE-XR) 500 MG 24 hr tablet Take 500 mg by mouth 2 (two) times daily.     . metoprolol tartrate (LOPRESSOR) 25 MG tablet Take 1 tablet (25 mg total) by mouth 2 (two) times daily. 180 tablet 2  . Multiple Vitamins-Minerals (PRESERVISION AREDS 2) CAPS Take 1 capsule by mouth daily.    . Multiple Vitamins-Minerals (ZINC PO) Take 0.5 tablets by mouth 2 (two) times a week.     . Polyvinyl Alcohol-Povidone PF (REFRESH) 1.4-0.6 % SOLN Place 1 drop into both eyes daily as needed (dry eyes).     . potassium chloride SA (KLOR-CON) 20 MEQ tablet Take 1 tablet (20 mEq total) by mouth daily. 90 tablet 2  . rosuvastatin (CRESTOR) 10 MG tablet Take 10 mg by mouth at bedtime.    . sertraline (ZOLOFT) 50 MG tablet Take 50 mg by mouth daily.    . vitamin B-12 (CYANOCOBALAMIN) 1000 MCG tablet Take 1,000 mcg by mouth as needed (energy).      No current facility-administered medications for this encounter.    Allergies  Allergen Reactions  . Penicillins Itching, Rash and Other (See Comments)    Did it involve swelling of the face/tongue/throat, SOB, or low BP? No Did it involve sudden or severe rash/hives, skin peeling, or any reaction on the inside of your mouth or nose? No Did you need to seek medical attention at a hospital or doctor's office? No When did it last happen?30+ years If all above answers are "NO", may proceed with cephalosporin use.     Social History   Socioeconomic History  . Marital status: Divorced    Spouse name: Not on file  . Number of children: 2  . Years of education: Not on file  . Highest education level: Not on file  Occupational History  . Occupation: Retired    Comment: Education officer, museum  Tobacco Use  . Smoking status: Never Smoker  . Smokeless  tobacco: Never Used  Substance and Sexual Activity  . Alcohol use: No    Alcohol/week: 0.0 standard drinks  . Drug use: No  . Sexual activity: Never  Other Topics Concern  . Not on file  Social History Narrative  . Not on file   Social Determinants of Health   Financial Resource Strain:   . Difficulty of Paying Living Expenses:   Food Insecurity:   . Worried About Charity fundraiser in the Last Year:   . Arboriculturist in the Last Year:   Transportation Needs:   . Film/video editor (Medical):   Marland Kitchen Lack of Transportation (Non-Medical):   Physical Activity:   . Days of Exercise per Week:   . Minutes of Exercise per Session:   Stress:   . Feeling of Stress :   Social Connections:   . Frequency of Communication with Friends and Family:   . Frequency of Social Gatherings with Friends and Family:   . Attends Religious Services:   . Active Member of Clubs or Organizations:   . Attends Archivist Meetings:   Marland Kitchen Marital Status:   Intimate Partner Violence:   . Fear of Current or Ex-Partner:   . Emotionally Abused:   Marland Kitchen Physically Abused:   .  Sexually Abused:     Family History  Problem Relation Age of Onset  . Prostate cancer Father   . Diabetes Sister   . Heart disease Sister   . Heart attack Brother   . Heart disease Brother   . Diabetes Brother   . Diabetes Sister   . Diabetes Sister   . Pancreatic cancer Sister   . Diabetes Brother   . Heart disease Brother   . Diabetes Brother   . Heart disease Brother   . Diabetes Brother   . Heart disease Brother   . Diabetes Brother   . Heart disease Brother   . Diabetes Brother   . Heart disease Brother   . Diabetes Brother   . Diabetes Child     ROS- All systems are reviewed and negative except as per the HPI above  Physical Exam: Vitals:   02/11/20 1409  BP: 116/82  Pulse: (!) 114  Weight: 81.4 kg  Height: _0  (1.676 m)   Wt Readings from Last 3 Encounters:  02/11/20 81.4 kg  01/22/20 82.6  kg  01/15/20 82.6 kg    Labs: Lab Results  Component Value Date   NA 139 12/03/2019   K 4.4 12/03/2019   CL 106 12/03/2019   CO2 21 (L) 12/03/2019   GLUCOSE 120 (H) 12/03/2019   BUN 18 12/03/2019   CREATININE 1.11 (H) 12/03/2019   CALCIUM 9.1 12/03/2019   MG 2.2 06/07/2017   Lab Results  Component Value Date   INR 1.12 04/20/2016   No results found for: CHOL, HDL, LDLCALC, TRIG   GEN- The patient is well appearing, alert and oriented x 3 today.   Head- normocephalic, atraumatic Eyes-  Sclera clear, conjunctiva pink Ears- hearing intact Oropharynx- clear Neck- supple, no JVP Lymph- no cervical lymphadenopathy Lungs- Clear to ausculation bilaterally, normal work of breathing Heart- irregular rate and rhythm, no murmurs, rubs or gallops, PMI not laterally displaced GI- soft, NT, ND, + BS Extremities- no clubbing, cyanosis, or edema MS- no significant deformity or atrophy Skin- no rash or lesion Psych- euthymic mood, full affect Neuro- strength and sensation are intact  EKG- afib at 114 ms, qrs int 104 abd qtc at 512 ms Epic records reviewed   Assessment and Plan: 1. Persistent afib Multiple cardioversion's in the past  Last cardioversion, 2/17, was successful but  with significant  weakness 2/2 bradycardia Sinus brady resolved with reduction of BB and amiodarone back to 200 mg qd  However now back in afib  Discussed with pt Natasha feel that amiodarone is no longer effective to maintain SR and Natasha think it is best to stop drug and rate control  Increase metoprolol to 25 mg bid  She may be a Tikosyn candidate after 3 month amio washout if qt shortens and she does not tolerate rate control   2. HTN Stable  3. CHA2DS2VASc score of 6 Eliquis 5 mg bid   F/u in afib clinic in 2-3 weeks    Butch Penny C. Ayris Carano, [facility] [address] [place], [place] [address] [phone]

## 2020-02-16 NOTE — Addendum Note (Signed)
Encounter addended by: Sherran Needs, NP on: 02/16/2020 8:43 AM  Actions taken: Level of Service modified

## 2020-02-25 ENCOUNTER — Ambulatory Visit (HOSPITAL_COMMUNITY)
Admission: RE | Admit: 2020-02-25 | Discharge: 2020-02-25 | Disposition: A | Discharge status: Home / Self Care | Payer: Medicare PPO | Source: Ambulatory Visit | Attending: Nurse Practitioner | Admitting: Nurse Practitioner

## 2020-02-25 ENCOUNTER — Encounter (HOSPITAL_COMMUNITY): Payer: Self-pay | Admitting: Nurse Practitioner

## 2020-02-25 ENCOUNTER — Other Ambulatory Visit: Payer: Self-pay

## 2020-02-25 VITALS — BP 140/82 | HR 87 | Ht 66.0 in | Wt 183.0 lb

## 2020-02-25 DIAGNOSIS — Z833 Family history of diabetes mellitus: Secondary | ICD-10-CM | POA: Insufficient documentation

## 2020-02-25 DIAGNOSIS — G4733 Obstructive sleep apnea (adult) (pediatric): Secondary | ICD-10-CM | POA: Diagnosis not present

## 2020-02-25 DIAGNOSIS — Z85850 Personal history of malignant neoplasm of thyroid: Secondary | ICD-10-CM | POA: Diagnosis not present

## 2020-02-25 DIAGNOSIS — I4819 Other persistent atrial fibrillation: Secondary | ICD-10-CM | POA: Diagnosis present

## 2020-02-25 DIAGNOSIS — Z8541 Personal history of malignant neoplasm of cervix uteri: Secondary | ICD-10-CM | POA: Insufficient documentation

## 2020-02-25 DIAGNOSIS — Z880 Allergy status to penicillin: Secondary | ICD-10-CM | POA: Diagnosis not present

## 2020-02-25 DIAGNOSIS — D6869 Other thrombophilia: Secondary | ICD-10-CM | POA: Diagnosis not present

## 2020-02-25 DIAGNOSIS — Z853 Personal history of malignant neoplasm of breast: Secondary | ICD-10-CM | POA: Insufficient documentation

## 2020-02-25 DIAGNOSIS — K219 Gastro-esophageal reflux disease without esophagitis: Secondary | ICD-10-CM | POA: Diagnosis not present

## 2020-02-25 DIAGNOSIS — E119 Type 2 diabetes mellitus without complications: Secondary | ICD-10-CM | POA: Insufficient documentation

## 2020-02-25 DIAGNOSIS — I10 Essential (primary) hypertension: Secondary | ICD-10-CM | POA: Insufficient documentation

## 2020-02-25 DIAGNOSIS — Z79890 Hormone replacement therapy (postmenopausal): Secondary | ICD-10-CM | POA: Diagnosis not present

## 2020-02-25 DIAGNOSIS — I420 Dilated cardiomyopathy: Secondary | ICD-10-CM | POA: Insufficient documentation

## 2020-02-25 DIAGNOSIS — Z791 Long term (current) use of non-steroidal anti-inflammatories (NSAID): Secondary | ICD-10-CM | POA: Insufficient documentation

## 2020-02-25 DIAGNOSIS — Z79899 Other long term (current) drug therapy: Secondary | ICD-10-CM | POA: Diagnosis not present

## 2020-02-25 DIAGNOSIS — Z7984 Long term (current) use of oral hypoglycemic drugs: Secondary | ICD-10-CM | POA: Diagnosis not present

## 2020-02-25 DIAGNOSIS — Z8249 Family history of ischemic heart disease and other diseases of the circulatory system: Secondary | ICD-10-CM | POA: Insufficient documentation

## 2020-02-25 MED ORDER — METOPROLOL TARTRATE 25 MG PO TABS: 37.5000 mg | ORAL_TABLET | Freq: Two times a day (BID) | ORAL | 2 refills | Status: DC

## 2020-02-25 NOTE — Patient Instructions (Signed)
Increase metoprolol to 37.5mg  twice a day (1 & 1/2 of your 25mg  tablets twice a day)

## 2020-02-25 NOTE — Progress Notes (Signed)
Primary Care Physician: Lavone Orn, MD Cardiologist: [name] Pulmonologist: Dr. Elsworth Soho GI: Dr. Dolphus Jenny [name] is a 86 y.o. female with a h/o afib that has been on amiodarone since June of 2015. She has had many breakthrough events, last one in February with successful cardioversion. She does feel very weak in afib. She went into afib around 2 weeks ago and a RN came out to her house on Friday and documented the afib.  Dr. Rayann Heman was called and left instructions to increase amio to 200 mg bid and stop losartan. Apparently her BP was low on that day.  She is in now in the afib clinc and she is rate controlled and BP is stable. She reports around 2-3 weeks ago she first had 2 teeth pulled and then a shot of cortisone in her knee. She hasn't felt well since. She usually requires a cardioversion to restore SR.    F/u in afib clinic 7/15 after successful cardioversion. She is in SR today and feels improved. She is back to Amio 200 mg daily.  F/u in afib clinic, 11/25/19, with feelings of not being well and being out of rhythm x one week. She has had an UTI and just finished a round of doxycycline. EKG shows atrial flutter  variable rate at 110 bpm.CHA2DS2-VASc Score = 6, continues eliquis 5 mg bid. 1   F/u in  afib clinic, 12/16/18. She had a successful cardioversion but unfortunately felt like it only lasted one day. She is in afib in the 90's. Natasha will try to increase BB and keep amio  at 200 mg a little longer to see if she may return to [place].   F/u  in afib clinic, 01/15/20.  F/u cardioversion 2/17. She was kept on her higher dose of BB at time of cardioversion as she did covert out but with PAC's. She saw [name] one week out and was staying in [place]. She  was left on amiodarone 200 mg bid until f/u here. She called the office earlier in the week and c/o of feeling weak. EKG shows  Sinus brady at 48 bpm. Natasha feel her bradycardia is the reason for her weakness and will titrate meds down today.    F/u 01/22/20. She remains  in SR  in the low 60's but does not feel that much better.  Back on amiodarone 200 mg daily from  BID and lower dose of BB. Marland Kitchen She had some GI distress yesterday and recently had issues with UTI. She  denies urgency or frequency today.  F/u in afib clinic, 4/1, as pt has gone back into afib. Discussed with her Natasha feel that amiodarone is no longer effective and Natasha think it is best to stop drug at this point and rate control. If qt shortens off 3 month amiodarone wash out, currently at 512 ms then tikosyn may be an option down the line. She has multiple cardioversions with significant ERAF.  F/u in afib clinic, 4/14. She is feeling better off amiodarone and she is rate controlled in the 80's but would like to see her a little slower so will increase BB today. We can consider Tikosyn after  3 month amiodarone wash out if qt interval shortens.   Today, she denies symptoms of chest pain, shortness of breath, orthopnea, PND, lower extremity edema, dizziness, presyncope, syncope, or neurologic sequela.Perisistent cough. The patient is tolerating medications without difficulties and is otherwise without complaint today.   Past Medical History:  Diagnosis Date  . Acute bronchitis 11/15/2017  . ADENOCARCINOMA, BREAST 10/13/2010   Qualifier: Diagnosis of  By: Nils Pyle CMA (AAMA), Mearl Latin    . 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 Allergy    SEASONAL  . Anemia    years ago  . ANEMIA, IRON DEFICIENCY 10/14/2010   Qualifier: Diagnosis of  By: Sharlett Iles MD Byrd Hesselbach   . Anemia, unspecified 10/13/2010   Qualifier: Diagnosis of  By: Nils Pyle CMA ([place]), Mearl Latin    . Anticoagulated by anticoagulation treatment - Eliquis 03/30/2014   Started May 2015   . Anxiety state 10/13/2010   Qualifier: Diagnosis of  By: Nils Pyle CMA ([place]), Mearl Latin    . Arthritis    "all my joints" (03/31/2014)  . Atrial fibrillation ([place])   . Atrial fibrillation with RVR ([place]) 03/31/2015   DLCO dropped  from 100% in 2015-50% in 01/2017  . Barrett's esophagus   . Breast cancer [facility])    s/p right breast lumpectomy  . CARDIAC MURMUR 10/13/2010   Qualifier: Diagnosis of  By: Nils Pyle CMA ([place]), Mearl Latin    . 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 Cervical cancer ([place])   . Chronic cough 01/02/2017   GERD, hiatal hernia Doubt amio  . COLONIC POLYPS, ADENOMATOUS, HX OF 10/13/2010   Qualifier: Diagnosis of  By: Nils Pyle CMA (AAMA), Mearl Latin    . Degenerative disorder of eye 07/25/2012  . Diabetes mellitus without complication ([place])   . Dilated cardiomyopathy ([place]) 08/27/2018  . DIVERTICULOSIS, COLON 10/13/2010   Qualifier: Diagnosis of  By: Nils Pyle CMA (AAMA), Mearl Latin    . DJD (degenerative joint disease) of knee   . Dysrhythmia    Afib  . EROSIVE ESOPHAGITIS 10/13/2010   Qualifier: Diagnosis of  By: Nils Pyle CMA ([place]), Mearl Latin    . Error, refractive, myopia 07/09/2012  . Essential hypertension 10/13/2010   Qualifier: Diagnosis of  By: Nils Pyle CMA ([place]), Mearl Latin    . Exotropia, intermittent 07/09/2012  . GERD 10/14/2010   Qualifier: Diagnosis of  By: Sharlett Iles MD Byrd Hesselbach GERD (gastroesophageal reflux disease)   . Glaucoma   . H/O hiatal hernia   . Heart murmur   . Hiatal hernia s/p robotic repair & fundoplication 2/44/0102 72/03/3663   Qualifier: Diagnosis of  By: Nils Pyle CMA (AAMA), Mearl Latin    . History of laser assisted in situ keratomileusis 07/25/2012  . Hx of transesophageal echocardiography (TEE) for monitoring    TEE (03/2014): No LAA clot, moderate LAE, core triatriatum type structure in RA with no stenosis, normal EF 55-60%, mild MR  . HYPERCHOLESTEROLEMIA 10/13/2010   Qualifier: Diagnosis of  By: Nils Pyle CMA ([place]), Mearl Latin    . Hypertension   . Hypothyroidism, postsurgical   . Irritable bowel syndrome 10/13/2010   Qualifier: Diagnosis of  By: Nils Pyle CMA (AAMA), Mearl Latin    . Lumbar stenosis with neurogenic claudication 04/20/2016  . Malignant neoplasm of breast ([place]) 02/25/2016  . Migraine     "last one was years ago" (03/31/2014)  . MIGRAINE HEADACHE 10/13/2010   Qualifier: Diagnosis of  By: Nils Pyle CMA ([place]), Mearl Latin    . 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 OSA on CPAP    settings at 10-11   . OSTEOARTHRITIS 10/13/2010   Qualifier: Diagnosis of  By: Nils Pyle CMA ([place]), Mearl Latin    . Post-surgical hypothyroidism 04/09/2014  . RECTAL FISSURE 10/13/2010   Qualifier: Diagnosis of  By: Nils Pyle CMA ([place]), Mearl Latin    . S/P Nissen fundoplication (  without gastrostomy tube) procedure 05/25/2017  . Sleep apnea   . THYROID CANCER 10/13/2010   Qualifier: Diagnosis of  By: Nils Pyle CMA ([place]), Mearl Latin    . Thyroid cancer [facility])    s/p thyroidectomy  . Type II diabetes mellitus ([place])    type II    Past Surgical History:  Procedure Laterality Date  . ABDOMINAL HYSTERECTOMY     "partial"  . BREAST BIOPSY Right   . BREAST LUMPECTOMY Right   . CARDIOVERSION N/A 03/30/2014   Procedure: CARDIOVERSION - BEDSIDE;  Surgeon: [name], MD;  Location: [place];  Service: Cardiovascular;  Laterality: N/A;  . CARDIOVERSION N/A 03/31/2014   Procedure: CARDIOVERSION  (BEDSIDE) ;  Surgeon: Lelon Perla, MD;  Location: ;  Service: Cardiovascular;  Laterality: N/A;  . CARDIOVERSION N/A 04/27/2014   Procedure: CARDIOVERSION;  Surgeon: Darlin Coco, MD;  Location: [place];  Service: Cardiovascular;  Laterality: N/A;  . CARDIOVERSION N/A 04/13/2015   Procedure: CARDIOVERSION;  Surgeon: Thayer Headings, MD;  Location: [place];  Service: Cardiovascular;  Laterality: N/A;  . CARDIOVERSION N/A 01/08/2017   Procedure: CARDIOVERSION;  Surgeon: Sanda Klein, MD;  Location: [facility] ENDOSCOPY;  Service: Cardiovascular;  Laterality: N/A;  . CARDIOVERSION N/A 09/19/2017   Procedure: CARDIOVERSION;  Surgeon: [name], MD;  Location: [place];  Service: Cardiovascular;  Laterality: N/A;  . CARDIOVERSION N/A 10/25/2017   Procedure: CARDIOVERSION;  Surgeon: Acie Fredrickson Wonda Cheng, MD;   Location: [place];  Service: Cardiovascular;  Laterality: N/A;  . CARDIOVERSION N/A 12/27/2018   Procedure: CARDIOVERSION;  Surgeon: [name], MD;  Location: [place];  Service: Cardiovascular;  Laterality: N/A;  . CARDIOVERSION N/A 05/20/2019   Procedure: CARDIOVERSION;  Surgeon: [name], MD;  Location: [facility] ENDOSCOPY;  Service: Cardiovascular;  Laterality: N/A;  . CARDIOVERSION N/A 12/10/2019   Procedure: CARDIOVERSION;  Surgeon: Pixie Casino, MD;  Location: [facility] ENDOSCOPY;  Service: Cardiovascular;  Laterality: N/A;  . CARDIOVERSION N/A 12/31/2019   Procedure: CARDIOVERSION;  Surgeon: Sanda Klein, MD;  Location: [facility] ENDOSCOPY;  Service: Cardiovascular;  Laterality: N/A;  . CARPAL TUNNEL RELEASE Right   . CATARACT EXTRACTION W/ INTRAOCULAR LENS  IMPLANT, BILATERAL Bilateral   . COLONOSCOPY    . EXCISIONAL HEMORRHOIDECTOMY    . INSERTION OF MESH N/A 05/25/2017   Procedure: INSERTION OF MESH;  Surgeon: Ralene Ok, MD;  Location: [facility];  Service: General;  Laterality: N/A;  . TEE WITHOUT CARDIOVERSION N/A 03/30/2014   Procedure: TRANSESOPHAGEAL ECHOCARDIOGRAM (TEE);  Surgeon: [name], MD;  Location[facility] ENDOSCOPY;  Service: Cardiovascular;  Laterality: N/A;  . THYROIDECTOMY    . TONSILLECTOMY      Current Outpatient Medications  Medication Sig Dispense Refill  . ACCU-CHEK GUIDE test strip     . acetaminophen (TYLENOL) 650 MG CR tablet Take 650 mg by mouth every 8 (eight) hours.     Marland Kitchen apixaban (ELIQUIS) 5 MG TABS tablet Take 1 tablet (5 mg total) by mouth 2 (two) times daily. 180 tablet 2  . Blood Glucose Monitoring Suppl (ACCU-CHEK GUIDE ME) w/Device KIT     . calcium carbonate (OSCAL) 1500 (600 Ca) MG TABS tablet Take 600 mg of elemental calcium by mouth daily.    . cetirizine (ZYRTEC) 10 MG tablet Take 10 mg by mouth daily.     . Cholecalciferol (DIALYVITE VITAMIN D 5000) 125 MCG (5000 UT) capsule Take 5,000 Units by mouth daily.    . ferrous  sulfate 325 (65 FE) MG  tablet Take 325 mg by mouth daily with breakfast.    . fluticasone (FLONASE) 50 MCG/ACT nasal spray Place 1 spray into both nostrils at bedtime.     . lansoprazole (PREVACID) 30 MG capsule Take 30 mg by mouth daily.     Marland Kitchen levothyroxine (SYNTHROID) 137 MCG tablet Take 137 mcg by mouth daily.     Marland Kitchen LORazepam (ATIVAN) 1 MG tablet Take 1.5 mg by mouth at bedtime.     . Lutein 6 MG CAPS Take 6 mg by mouth once a week.     . metFORMIN (GLUCOPHAGE-XR) 500 MG 24 hr tablet Take 500 mg by mouth 2 (two) times daily.     . metoprolol tartrate (LOPRESSOR) 25 MG tablet Take 1.5 tablets (37.5 mg total) by mouth 2 (two) times daily. 180 tablet 2  . Multiple Vitamins-Minerals (PRESERVISION AREDS 2) CAPS Take 1 capsule by mouth daily.    . Multiple Vitamins-Minerals (ZINC PO) Take 0.5 tablets by mouth 2 (two) times a week.     . Polyvinyl Alcohol-Povidone PF (REFRESH) 1.4-0.6 % SOLN Place 1 drop into both eyes daily as needed (dry eyes).     . potassium chloride SA (KLOR-CON) 20 MEQ tablet Take 1 tablet (20 mEq total) by mouth daily. 90 tablet 2  . rosuvastatin (CRESTOR) 10 MG tablet Take 10 mg by mouth at bedtime.    . sertraline (ZOLOFT) 50 MG tablet Take 50 mg by mouth daily.    . vitamin B-12 (CYANOCOBALAMIN) 1000 MCG tablet Take 1,000 mcg by mouth as needed (energy).      No current facility-administered medications for this encounter.    Allergies  Allergen Reactions  . Penicillins Itching, Rash and Other (See Comments)    Did it involve swelling of the face/tongue/throat, SOB, or low BP? No Did it involve sudden or severe rash/hives, skin peeling, or any reaction on the inside of your mouth or nose? No Did you need to seek medical attention at a hospital or doctor's office? No When did it last happen?30+ years If all above answers are "NO", may proceed with cephalosporin use.     Social History   Socioeconomic History  . Marital status: Divorced    Spouse name: Not  on file  . Number of children: 2  . Years of education: Not on file  . Highest education level: Not on file  Occupational History  . Occupation: Retired    Comment: Education officer, museum  Tobacco Use  . Smoking status: Never Smoker  . Smokeless tobacco: Never Used  Substance and Sexual Activity  . Alcohol use: No    Alcohol/week: 0.0 standard drinks  . Drug use: No  . Sexual activity: Never  Other Topics Concern  . Not on file  Social History Narrative  . Not on file   Social Determinants of Health   Financial Resource Strain:   . Difficulty of Paying Living Expenses:   Food Insecurity:   . Worried About Charity fundraiser in the Last Year:   . Arboriculturist in the Last Year:   Transportation Needs:   . Film/video editor (Medical):   Marland Kitchen Lack of Transportation (Non-Medical):   Physical Activity:   . Days of Exercise per Week:   . Minutes of Exercise per Session:   Stress:   . Feeling of Stress :   Social Connections:   . Frequency of Communication with Friends and Family:   . Frequency of Social Gatherings with Friends and Family:   .  Attends Religious Services:   . Active Member of Clubs or Organizations:   . Attends Archivist Meetings:   Marland Kitchen Marital Status:   Intimate Partner Violence:   . Fear of Current or Ex-Partner:   . Emotionally Abused:   Marland Kitchen Physically Abused:   . Sexually Abused:     Family History  Problem Relation Age of Onset  . Prostate cancer Father   . Diabetes Sister   . Heart disease Sister   . Heart attack Brother   . Heart disease Brother   . Diabetes Brother   . Diabetes Sister   . Diabetes Sister   . Pancreatic cancer Sister   . Diabetes Brother   . Heart disease Brother   . Diabetes Brother   . Heart disease Brother   . Diabetes Brother   . Heart disease Brother   . Diabetes Brother   . Heart disease Brother   . Diabetes Brother   . Heart disease Brother   . Diabetes Brother   . Diabetes Child     ROS- All systems  are reviewed and negative except as per the HPI above  Physical Exam: Vitals:   02/25/20 1411  BP: 140/82  Pulse: 87  Weight: 83 kg  Height: 5' 6" (1.676 m)   Wt Readings from Last 3 Encounters:  02/25/20 83 kg  02/11/20 81.4 kg  01/22/20 82.6 kg    Labs: Lab Results  Component Value Date   NA 139 12/03/2019   K 4.4 12/03/2019   CL 106 12/03/2019   CO2 21 (L) 12/03/2019   GLUCOSE 120 (H) 12/03/2019   BUN 18 12/03/2019   CREATININE 1.11 (H) 12/03/2019   CALCIUM 9.1 12/03/2019   MG 2.2 06/07/2017   Lab Results  Component Value Date   INR 1.12 04/20/2016   No results found for: CHOL, HDL, LDLCALC, TRIG   GEN- The patient is well appearing, alert and oriented x 3 today.   Head- normocephalic, atraumatic Eyes-  Sclera clear, conjunctiva pink Ears- hearing intact Oropharynx- clear Neck- supple, no JVP Lymph- no cervical lymphadenopathy Lungs- Clear to ausculation bilaterally, normal work of breathing Heart- irregular rate and rhythm, no murmurs, rubs or gallops, PMI not laterally displaced GI- soft, NT, ND, + BS Extremities- no clubbing, cyanosis, or edema MS- no significant deformity or atrophy Skin- no rash or lesion Psych- euthymic mood, full affect Neuro- strength and sensation are intact  EKG- atrial flutter at 87 bpm,  qrs int 102 ms, qt int 483 ms  Epic records reviewed   Assessment and Plan: 1. Persistent afib Multiple cardioversion's in the past  Last cardioversion, 2/17, was successful but  with significant  weakness 2/2 bradycardia Sinus brady resolved with reduction of BB and amiodarone back to 200 mg qd  However now back in afib  Discussed on last visit  with pt Natasha feel that amiodarone is no longer effective to maintain SR and Natasha can not keep her at 200 mg bid as she may get toxic on it. Natasha think it is best to stop drug and rate control  Will increase metoprolol to 25 (1 1/2 mg)  bid  She may be a Tikosyn candidate after 3 month amio washout if qt  shortens and she does not tolerate rate control   2. HTN Stable  3. CHA2DS2VASc score of 6 Eliquis 5 mg bid   F/u in afib clinic in 2 weeks    [name], [place]  [facility] [address] [place], [place] [address] [phone]

## 2020-03-10 ENCOUNTER — Ambulatory Visit (HOSPITAL_COMMUNITY)
Admission: RE | Admit: 2020-03-10 | Discharge: 2020-03-10 | Disposition: A | Discharge status: Home / Self Care | Payer: Medicare PPO | Source: Ambulatory Visit | Attending: Nurse Practitioner | Admitting: Nurse Practitioner

## 2020-03-10 ENCOUNTER — Other Ambulatory Visit: Payer: Self-pay

## 2020-03-10 ENCOUNTER — Encounter (HOSPITAL_COMMUNITY): Payer: Self-pay | Admitting: Nurse Practitioner

## 2020-03-10 VITALS — BP 164/104 | HR 95 | Ht 66.0 in | Wt 180.4 lb

## 2020-03-10 DIAGNOSIS — F419 Anxiety disorder, unspecified: Secondary | ICD-10-CM | POA: Diagnosis not present

## 2020-03-10 DIAGNOSIS — Z79899 Other long term (current) drug therapy: Secondary | ICD-10-CM | POA: Diagnosis not present

## 2020-03-10 DIAGNOSIS — E890 Postprocedural hypothyroidism: Secondary | ICD-10-CM | POA: Insufficient documentation

## 2020-03-10 DIAGNOSIS — D509 Iron deficiency anemia, unspecified: Secondary | ICD-10-CM | POA: Diagnosis not present

## 2020-03-10 DIAGNOSIS — M1990 Unspecified osteoarthritis, unspecified site: Secondary | ICD-10-CM | POA: Diagnosis not present

## 2020-03-10 DIAGNOSIS — Z880 Allergy status to penicillin: Secondary | ICD-10-CM | POA: Diagnosis not present

## 2020-03-10 DIAGNOSIS — Z833 Family history of diabetes mellitus: Secondary | ICD-10-CM | POA: Diagnosis not present

## 2020-03-10 DIAGNOSIS — Z853 Personal history of malignant neoplasm of breast: Secondary | ICD-10-CM | POA: Diagnosis not present

## 2020-03-10 DIAGNOSIS — Z8541 Personal history of malignant neoplasm of cervix uteri: Secondary | ICD-10-CM | POA: Insufficient documentation

## 2020-03-10 DIAGNOSIS — I4819 Other persistent atrial fibrillation: Secondary | ICD-10-CM | POA: Diagnosis not present

## 2020-03-10 DIAGNOSIS — E119 Type 2 diabetes mellitus without complications: Secondary | ICD-10-CM | POA: Diagnosis not present

## 2020-03-10 DIAGNOSIS — G4733 Obstructive sleep apnea (adult) (pediatric): Secondary | ICD-10-CM | POA: Insufficient documentation

## 2020-03-10 DIAGNOSIS — I10 Essential (primary) hypertension: Secondary | ICD-10-CM | POA: Insufficient documentation

## 2020-03-10 DIAGNOSIS — D6869 Other thrombophilia: Secondary | ICD-10-CM | POA: Diagnosis not present

## 2020-03-10 DIAGNOSIS — Z7984 Long term (current) use of oral hypoglycemic drugs: Secondary | ICD-10-CM | POA: Insufficient documentation

## 2020-03-10 DIAGNOSIS — K219 Gastro-esophageal reflux disease without esophagitis: Secondary | ICD-10-CM | POA: Insufficient documentation

## 2020-03-10 DIAGNOSIS — Z8249 Family history of ischemic heart disease and other diseases of the circulatory system: Secondary | ICD-10-CM | POA: Diagnosis not present

## 2020-03-10 DIAGNOSIS — Z7901 Long term (current) use of anticoagulants: Secondary | ICD-10-CM | POA: Diagnosis not present

## 2020-03-10 DIAGNOSIS — Z79890 Hormone replacement therapy (postmenopausal): Secondary | ICD-10-CM | POA: Insufficient documentation

## 2020-03-10 DIAGNOSIS — Z800 Family history of malignant neoplasm of digestive organs: Secondary | ICD-10-CM | POA: Diagnosis not present

## 2020-03-10 DIAGNOSIS — Z85850 Personal history of malignant neoplasm of thyroid: Secondary | ICD-10-CM | POA: Insufficient documentation

## 2020-03-10 DIAGNOSIS — Z8042 Family history of malignant neoplasm of prostate: Secondary | ICD-10-CM | POA: Insufficient documentation

## 2020-03-10 DIAGNOSIS — I4891 Unspecified atrial fibrillation: Secondary | ICD-10-CM | POA: Diagnosis present

## 2020-03-10 NOTE — Progress Notes (Signed)
Primary Care Physician: Lavone Orn, MD Cardiologist: [name] Pulmonologist: Dr. Elsworth Soho GI: Dr. Dolphus Jenny [name] is a 86 y.o. female with a h/o afib that has been on amiodarone since June of 2015. She has had many breakthrough events, last one in February with successful cardioversion. She does feel very weak in afib. She went into afib around 2 weeks ago and a RN came out to her house on Friday and documented the afib.  Dr. Rayann Heman was called and left instructions to increase amio to 200 mg bid and stop losartan. Apparently her BP was low on that day.  She is in now in the afib clinc and she is rate controlled and BP is stable. She reports around 2-3 weeks ago she first had 2 teeth pulled and then a shot of cortisone in her knee. She hasn't felt well since. She usually requires a cardioversion to restore SR.    F/u in afib clinic 7/15 after successful cardioversion. She is in SR today and feels improved. She is back to Amio 200 mg daily.  F/u in afib clinic, 11/25/19, with feelings of not being well and being out of rhythm x one week. She has had an UTI and just finished a round of doxycycline. EKG shows atrial flutter  variable rate at 110 bpm.CHA2DS2-VASc Score = 6, continues eliquis 5 mg bid. 1   F/u in  afib clinic, 12/16/18. She had a successful cardioversion but unfortunately felt like it only lasted one day. She is in afib in the 90's. Natasha will try to increase BB and keep amio  at 200 mg a little longer to see if she may return to [place].   F/u  in afib clinic, 01/15/20.  F/u cardioversion 2/17. She was kept on her higher dose of BB at time of cardioversion as she did covert out but with PAC's. She saw [name] one week out and was staying in [place]. She  was left on amiodarone 200 mg bid until f/u here. She called the office earlier in the week and c/o of feeling weak. EKG shows  Sinus brady at 48 bpm. Natasha feel her bradycardia is the reason for her weakness and will titrate meds down today.    F/u 01/22/20. She remains  in SR  in the low 60's but does not feel that much better.  Back on amiodarone 200 mg daily from  BID and lower dose of BB. Marland Kitchen She had some GI distress yesterday and recently had issues with UTI. She  denies urgency or frequency today.  F/u in afib clinic, 4/1, as pt has gone back into afib. Discussed with her Natasha feel that amiodarone is no longer effective and Natasha think it is best to stop drug at this point and rate control. If qt shortens off 3 month amiodarone wash out, currently at 512 ms then tikosyn may be an option down the line. She has multiple cardioversions with significant ERAF.  F/u in afib clinic, 4/14. She is feeling better off amiodarone and she is rate controlled in the 80's but would like to see her a little slower so will increase BB today. We can consider Tikosyn after  3 month amiodarone wash out if qt interval shortens.    She actually is feeling better and had the enegy to do more around her house and some light exercise. She is upset today as she had some diarrhea this am and had an accident in the clinic.Natasha feel this  is why her BP is higher today as she usually  does not have high BP. She feels it may the the plate of food her neighbor gave her last night. She is rate controlled at home in the 70's and 80's.   Today, she denies symptoms of chest pain, shortness of breath, orthopnea, PND, lower extremity edema, dizziness, presyncope, syncope, or neurologic sequela.Perisistent cough. The patient is tolerating medications without difficulties and is otherwise without complaint today.   Past Medical History:  Diagnosis Date  . Acute bronchitis 11/15/2017  . ADENOCARCINOMA, BREAST 10/13/2010   Qualifier: Diagnosis of  By: Nils Pyle CMA (AAMA), Mearl Latin    . 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 Allergy    SEASONAL  . Anemia    years ago  . ANEMIA, IRON DEFICIENCY 10/14/2010   Qualifier: Diagnosis of  By: Sharlett Iles MD Byrd Hesselbach   . Anemia,  unspecified 10/13/2010   Qualifier: Diagnosis of  By: Nils Pyle CMA ([place]), Mearl Latin    . Anticoagulated by anticoagulation treatment - Eliquis 03/30/2014   Started May 2015   . Anxiety state 10/13/2010   Qualifier: Diagnosis of  By: Nils Pyle CMA ([facility]), Mearl Latin    . Arthritis    "all my joints" (03/31/2014)  . Atrial fibrillation ([place])   . Atrial fibrillation with RVR ([place]) 03/31/2015   DLCO dropped from 100% in 2015-50% in 01/2017  . Barrett's esophagus   . Breast cancer [facility])    s/p right breast lumpectomy  . CARDIAC MURMUR 10/13/2010   Qualifier: Diagnosis of  By: Nils Pyle CMA ([place]), Mearl Latin    . 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 Cervical cancer ([place])   . Chronic cough 01/02/2017   GERD, hiatal hernia Doubt amio  . COLONIC POLYPS, ADENOMATOUS, HX OF 10/13/2010   Qualifier: Diagnosis of  By: Nils Pyle CMA (AAMA), Mearl Latin    . Degenerative disorder of eye 07/25/2012  . Diabetes mellitus without complication ([place])   . Dilated cardiomyopathy ([place]) 08/27/2018  . DIVERTICULOSIS, COLON 10/13/2010   Qualifier: Diagnosis of  By: Nils Pyle CMA (AAMA), Mearl Latin    . DJD (degenerative joint disease) of knee   . Dysrhythmia    Afib  . EROSIVE ESOPHAGITIS 10/13/2010   Qualifier: Diagnosis of  By: Nils Pyle CMA ([place]), Mearl Latin    . Error, refractive, myopia 07/09/2012  . Essential hypertension 10/13/2010   Qualifier: Diagnosis of  By: Nils Pyle CMA ([place]), Mearl Latin    . Exotropia, intermittent 07/09/2012  . GERD 10/14/2010   Qualifier: Diagnosis of  By: Sharlett Iles MD Byrd Hesselbach GERD (gastroesophageal reflux disease)   . Glaucoma   . H/O hiatal hernia   . Heart murmur   . Hiatal hernia s/p robotic repair & fundoplication 05/01/5092 26/05/1244   Qualifier: Diagnosis of  By: Nils Pyle CMA (AAMA), Mearl Latin    . History of laser assisted in situ keratomileusis 07/25/2012  . Hx of transesophageal echocardiography (TEE) for monitoring    TEE (03/2014): No LAA clot, moderate LAE, core triatriatum type structure in RA with no  stenosis, normal EF 55-60%, mild MR  . HYPERCHOLESTEROLEMIA 10/13/2010   Qualifier: Diagnosis of  By: Nils Pyle CMA ([place]), Mearl Latin    . Hypertension   . Hypothyroidism, postsurgical   . Irritable bowel syndrome 10/13/2010   Qualifier: Diagnosis of  By: Nils Pyle CMA (AAMA), Mearl Latin    . Lumbar stenosis with neurogenic claudication 04/20/2016  . Malignant neoplasm of breast ([place]) 02/25/2016  . Migraine    "last one was years ago" (03/31/2014)  .  MIGRAINE HEADACHE 10/13/2010   Qualifier: Diagnosis of  By: Nils Pyle CMA ([place]), Mearl Latin    . 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 OSA on CPAP    settings at 10-11   . OSTEOARTHRITIS 10/13/2010   Qualifier: Diagnosis of  By: Nils Pyle CMA ([place]), Mearl Latin    . Post-surgical hypothyroidism 04/09/2014  . RECTAL FISSURE 10/13/2010   Qualifier: Diagnosis of  By: Nils Pyle CMA ([employer]), Mearl Latin    . S/P Nissen fundoplication (without gastrostomy tube) procedure 05/25/2017  . Sleep apnea   . THYROID CANCER 10/13/2010   Qualifier: Diagnosis of  By: Nils Pyle CMA ([place]), Mearl Latin    . Thyroid cancer [facility])    s/p thyroidectomy  . Type II diabetes mellitus ([place])    type II    Past Surgical History:  Procedure Laterality Date  . ABDOMINAL HYSTERECTOMY     "partial"  . BREAST BIOPSY Right   . BREAST LUMPECTOMY Right   . CARDIOVERSION N/A 03/30/2014   Procedure: CARDIOVERSION - BEDSIDE;  Surgeon: [name], MD;  Location: [place];  Service: Cardiovascular;  Laterality: N/A;  . CARDIOVERSION N/A 03/31/2014   Procedure: CARDIOVERSION  (BEDSIDE) ;  Surgeon: Lelon Perla, MD;  Location: [place];  Service: Cardiovascular;  Laterality: N/A;  . CARDIOVERSION N/A 04/27/2014   Procedure: CARDIOVERSION;  Surgeon: Darlin Coco, MD;  Location: [place];  Service: Cardiovascular;  Laterality: N/A;  . CARDIOVERSION N/A 04/13/2015   Procedure: CARDIOVERSION;  Surgeon: Thayer Headings, MD;  Location: [place];  Service: Cardiovascular;  Laterality: N/A;  . CARDIOVERSION  N/A 01/08/2017   Procedure: CARDIOVERSION;  Surgeon: Sanda Klein, MD;  Location: [facility] ENDOSCOPY;  Service: Cardiovascular;  Laterality: N/A;  . CARDIOVERSION N/A 09/19/2017   Procedure: CARDIOVERSION;  Surgeon: [name], MD;  Location: [place];  Service: Cardiovascular;  Laterality: N/A;  . CARDIOVERSION N/A 10/25/2017   Procedure: CARDIOVERSION;  Surgeon: Acie Fredrickson Wonda Cheng, MD;  Location: [place];  Service: Cardiovascular;  Laterality: N/A;  . CARDIOVERSION N/A 12/27/2018   Procedure: CARDIOVERSION;  Surgeon: [name], MD;  Location: [place];  Service: Cardiovascular;  Laterality: N/A;  . CARDIOVERSION N/A 05/20/2019   Procedure: CARDIOVERSION;  Surgeon: [name], MD;  Location[facility] ENDOSCOPY;  Service: Cardiovascular;  Laterality: N/A;  . CARDIOVERSION N/A 12/10/2019   Procedure: CARDIOVERSION;  Surgeon: Pixie Casino, MD;  Location: [facility] ENDOSCOPY;  Service: Cardiovascular;  Laterality: N/A;  . CARDIOVERSION N/A 12/31/2019   Procedure: CARDIOVERSION;  Surgeon: Sanda Klein, MD;  Location: [facility] ENDOSCOPY;  Service: Cardiovascular;  Laterality: N/A;  . CARPAL TUNNEL RELEASE Right   . CATARACT EXTRACTION W/ INTRAOCULAR LENS  IMPLANT, BILATERAL Bilateral   . COLONOSCOPY    . EXCISIONAL HEMORRHOIDECTOMY    . INSERTION OF MESH N/A 05/25/2017   Procedure: INSERTION OF MESH;  Surgeon: Ralene Ok, MD;  Location: [facility];  Service: General;  Laterality: N/A;  . TEE WITHOUT CARDIOVERSION N/A 03/30/2014   Procedure: TRANSESOPHAGEAL ECHOCARDIOGRAM (TEE);  Surgeon: [name], MD;  Location: [facility] ENDOSCOPY;  Service: Cardiovascular;  Laterality: N/A;  . THYROIDECTOMY    . TONSILLECTOMY      Current Outpatient Medications  Medication Sig Dispense Refill  . ACCU-CHEK GUIDE test strip     . acetaminophen (TYLENOL) 650 MG CR tablet Take 650 mg by mouth every 8 (eight) hours.     Marland Kitchen apixaban (ELIQUIS) 5 MG TABS tablet Take 1 tablet (5 mg total) by mouth 2 (two)  times  daily. 180 tablet 2  . Blood Glucose Monitoring Suppl (ACCU-CHEK GUIDE ME) w/Device KIT     . calcium carbonate (OSCAL) 1500 (600 Ca) MG TABS tablet Take 600 mg of elemental calcium by mouth daily.    . cetirizine (ZYRTEC) 10 MG tablet Take 10 mg by mouth daily.     . Cholecalciferol (DIALYVITE VITAMIN D 5000) 125 MCG (5000 UT) capsule Take 5,000 Units by mouth daily.    . ferrous sulfate 325 (65 FE) MG tablet Take 325 mg by mouth daily with breakfast.    . fluticasone (FLONASE) 50 MCG/ACT nasal spray Place 1 spray into both nostrils at bedtime.     . lansoprazole (PREVACID) 30 MG capsule Take 30 mg by mouth daily.     Marland Kitchen levothyroxine (SYNTHROID) 137 MCG tablet Take 137 mcg by mouth daily.     Marland Kitchen LORazepam (ATIVAN) 1 MG tablet Take 1.5 mg by mouth at bedtime.     . Lutein 6 MG CAPS Take 6 mg by mouth once a week.     . metFORMIN (GLUCOPHAGE-XR) 500 MG 24 hr tablet Take 500 mg by mouth 2 (two) times daily.     . metoprolol tartrate (LOPRESSOR) 25 MG tablet Take 1.5 tablets (37.5 mg total) by mouth 2 (two) times daily. 180 tablet 2  . Multiple Vitamins-Minerals (PRESERVISION AREDS 2) CAPS Take 1 capsule by mouth daily.    . Multiple Vitamins-Minerals (ZINC PO) Take 0.5 tablets by mouth 2 (two) times a week.     . Polyvinyl Alcohol-Povidone PF (REFRESH) 1.4-0.6 % SOLN Place 1 drop into both eyes daily as needed (dry eyes).     . potassium chloride SA (KLOR-CON) 20 MEQ tablet Take 1 tablet (20 mEq total) by mouth daily. 90 tablet 2  . rosuvastatin (CRESTOR) 10 MG tablet Take 10 mg by mouth at bedtime.    . sertraline (ZOLOFT) 50 MG tablet Take 50 mg by mouth daily.    . vitamin B-12 (CYANOCOBALAMIN) 1000 MCG tablet Take 1,000 mcg by mouth as needed (energy).      No current facility-administered medications for this encounter.    Allergies  Allergen Reactions  . Penicillins Itching, Rash and Other (See Comments)    Did it involve swelling of the face/tongue/throat, SOB, or low BP? No Did  it involve sudden or severe rash/hives, skin peeling, or any reaction on the inside of your mouth or nose? No Did you need to seek medical attention at a hospital or doctor's office? No When did it last happen?30+ years If all above answers are "NO", may proceed with cephalosporin use.     Social History   Socioeconomic History  . Marital status: Divorced    Spouse name: Not on file  . Number of children: 2  . Years of education: Not on file  . Highest education level: Not on file  Occupational History  . Occupation: Retired    Comment: Education officer, museum  Tobacco Use  . Smoking status: Never Smoker  . Smokeless tobacco: Never Used  Substance and Sexual Activity  . Alcohol use: No    Alcohol/week: 0.0 standard drinks  . Drug use: No  . Sexual activity: Never  Other Topics Concern  . Not on file  Social History Narrative  . Not on file   Social Determinants of Health   Financial Resource Strain:   . Difficulty of Paying Living Expenses:   Food Insecurity:   . Worried About Charity fundraiser in the Last Year:   .  Ran Out of Food in the Last Year:   Transportation Needs:   . Film/video editor (Medical):   Marland Kitchen Lack of Transportation (Non-Medical):   Physical Activity:   . Days of Exercise per Week:   . Minutes of Exercise per Session:   Stress:   . Feeling of Stress :   Social Connections:   . Frequency of Communication with Friends and Family:   . Frequency of Social Gatherings with Friends and Family:   . Attends Religious Services:   . Active Member of Clubs or Organizations:   . Attends Archivist Meetings:   Marland Kitchen Marital Status:   Intimate Partner Violence:   . Fear of Current or Ex-Partner:   . Emotionally Abused:   Marland Kitchen Physically Abused:   . Sexually Abused:     Family History  Problem Relation Age of Onset  . Prostate cancer Father   . Diabetes Sister   . Heart disease Sister   . Heart attack Brother   . Heart disease Brother   .  Diabetes Brother   . Diabetes Sister   . Diabetes Sister   . Pancreatic cancer Sister   . Diabetes Brother   . Heart disease Brother   . Diabetes Brother   . Heart disease Brother   . Diabetes Brother   . Heart disease Brother   . Diabetes Brother   . Heart disease Brother   . Diabetes Brother   . Heart disease Brother   . Diabetes Brother   . Diabetes Child     ROS- All systems are reviewed and negative except as per the HPI above  Physical Exam: Vitals:   03/10/20 1413  BP: (!) 164/104  Pulse: 95  Weight: 81.8 kg  Height: 5' 6"  (1.676 m)   Wt Readings from Last 3 Encounters:  03/10/20 81.8 kg  02/25/20 83 kg  02/11/20 81.4 kg    Labs: Lab Results  Component Value Date   NA 139 12/03/2019   K 4.4 12/03/2019   CL 106 12/03/2019   CO2 21 (L) 12/03/2019   GLUCOSE 120 (H) 12/03/2019   BUN 18 12/03/2019   CREATININE 1.11 (H) 12/03/2019   CALCIUM 9.1 12/03/2019   MG 2.2 06/07/2017   Lab Results  Component Value Date   INR 1.12 04/20/2016   No results found for: CHOL, HDL, LDLCALC, TRIG   GEN- The patient is well appearing, alert and oriented x 3 today.   Head- normocephalic, atraumatic Eyes-  Sclera clear, conjunctiva pink Ears- hearing intact Oropharynx- clear Neck- supple, no JVP Lymph- no cervical lymphadenopathy Lungs- Clear to ausculation bilaterally, normal work of breathing Heart- irregular rate and rhythm, no murmurs, rubs or gallops, PMI not laterally displaced GI- soft, NT, ND, + BS Extremities- no clubbing, cyanosis, or edema MS- no significant deformity or atrophy Skin- no rash or lesion Psych- euthymic mood, full affect Neuro- strength and sensation are intact  EKG- afib at 95 bpm,  qrs int 98  ms, qt int 490  ms  Epic records reviewed   Assessment and Plan: 1. Persistent afib Multiple cardioversion's in the past  Last cardioversion, 2/17, was successful but  with significant  weakness 2/2 bradycardia Sinus brady resolved with  reduction of BB and amiodarone back to 200 mg qd  However now back in afib  Discussed on last visit  with pt Natasha feel that amiodarone is no longer effective to maintain SR and Natasha can not keep her at 200 mg bid  as she may get toxic on it. Natasha think it is best to stop drug and rate control  Continue metoprolol to 25 (1 1/2 mg)  bid  She actually is feeling better off amiodarone and rate controlled  She may be a Tikosyn candidate after 3 month amio washout if qt shortens and she does not tolerate rate control ( today qtc is 490 ms)  2. HTN Recheck 154/100  Elevated today Natasha think as she had an accident here today and has been bothered with diarrhea since this am   3. CHA2DS2VASc score of 6 Eliquis 5 mg bid   F/u in afib clinic in 6 weeks    Butch Penny C. Leiann Sporer, [facility] [address] [place],  [address] [phone]

## 2020-05-06 ENCOUNTER — Other Ambulatory Visit: Payer: Self-pay | Admitting: Nurse Practitioner

## 2020-05-11 ENCOUNTER — Ambulatory Visit (HOSPITAL_COMMUNITY)
Admission: RE | Admit: 2020-05-11 | Discharge: 2020-05-11 | Disposition: A | Discharge status: Home / Self Care | Payer: Medicare PPO | Source: Ambulatory Visit | Attending: Nurse Practitioner | Admitting: Nurse Practitioner

## 2020-05-11 ENCOUNTER — Other Ambulatory Visit: Payer: Self-pay

## 2020-05-11 VITALS — BP 136/80 | HR 98 | Ht 66.0 in | Wt 177.2 lb

## 2020-05-11 DIAGNOSIS — E7800 Pure hypercholesterolemia, unspecified: Secondary | ICD-10-CM | POA: Insufficient documentation

## 2020-05-11 DIAGNOSIS — Z85850 Personal history of malignant neoplasm of thyroid: Secondary | ICD-10-CM | POA: Insufficient documentation

## 2020-05-11 DIAGNOSIS — Z800 Family history of malignant neoplasm of digestive organs: Secondary | ICD-10-CM | POA: Diagnosis not present

## 2020-05-11 DIAGNOSIS — I4819 Other persistent atrial fibrillation: Secondary | ICD-10-CM | POA: Insufficient documentation

## 2020-05-11 DIAGNOSIS — I10 Essential (primary) hypertension: Secondary | ICD-10-CM | POA: Insufficient documentation

## 2020-05-11 DIAGNOSIS — D6869 Other thrombophilia: Secondary | ICD-10-CM

## 2020-05-11 DIAGNOSIS — Z833 Family history of diabetes mellitus: Secondary | ICD-10-CM | POA: Diagnosis not present

## 2020-05-11 DIAGNOSIS — G4733 Obstructive sleep apnea (adult) (pediatric): Secondary | ICD-10-CM | POA: Diagnosis not present

## 2020-05-11 DIAGNOSIS — Z8042 Family history of malignant neoplasm of prostate: Secondary | ICD-10-CM | POA: Diagnosis not present

## 2020-05-11 DIAGNOSIS — Z79890 Hormone replacement therapy (postmenopausal): Secondary | ICD-10-CM | POA: Diagnosis not present

## 2020-05-11 DIAGNOSIS — I4891 Unspecified atrial fibrillation: Secondary | ICD-10-CM | POA: Diagnosis present

## 2020-05-11 DIAGNOSIS — Z853 Personal history of malignant neoplasm of breast: Secondary | ICD-10-CM | POA: Insufficient documentation

## 2020-05-11 DIAGNOSIS — Z7984 Long term (current) use of oral hypoglycemic drugs: Secondary | ICD-10-CM | POA: Diagnosis not present

## 2020-05-11 DIAGNOSIS — Z8541 Personal history of malignant neoplasm of cervix uteri: Secondary | ICD-10-CM | POA: Insufficient documentation

## 2020-05-11 DIAGNOSIS — Z880 Allergy status to penicillin: Secondary | ICD-10-CM | POA: Diagnosis not present

## 2020-05-11 DIAGNOSIS — Z8249 Family history of ischemic heart disease and other diseases of the circulatory system: Secondary | ICD-10-CM | POA: Diagnosis not present

## 2020-05-11 DIAGNOSIS — Z7901 Long term (current) use of anticoagulants: Secondary | ICD-10-CM | POA: Insufficient documentation

## 2020-05-11 DIAGNOSIS — E890 Postprocedural hypothyroidism: Secondary | ICD-10-CM | POA: Diagnosis not present

## 2020-05-11 DIAGNOSIS — Z79899 Other long term (current) drug therapy: Secondary | ICD-10-CM | POA: Insufficient documentation

## 2020-05-11 DIAGNOSIS — K219 Gastro-esophageal reflux disease without esophagitis: Secondary | ICD-10-CM | POA: Insufficient documentation

## 2020-05-11 DIAGNOSIS — E119 Type 2 diabetes mellitus without complications: Secondary | ICD-10-CM | POA: Diagnosis not present

## 2020-05-11 NOTE — Progress Notes (Signed)
Primary Care Physician: Lavone Orn, MD Cardiologist: [name] Pulmonologist: Dr. Elsworth Soho GI: Dr. Dolphus Jenny [name] is a 86 y.o. female with a h/o afib that has been on amiodarone since June of 2015. She has had many breakthrough events, last one in February with successful cardioversion. She does feel very weak in afib. She went into afib around 2 weeks ago and a RN came out to her house on Friday and documented the afib.  Dr. Rayann Heman was called and left instructions to increase amio to 200 mg bid and stop losartan. Apparently her BP was low on that day.  She is in now in the afib clinc and she is rate controlled and BP is stable. She reports around 2-3 weeks ago she first had 2 teeth pulled and then a shot of cortisone in her knee. She hasn't felt well since. She usually requires a cardioversion to restore SR.    F/u in afib clinic 7/15 after successful cardioversion. She is in SR today and feels improved. She is back to Amio 200 mg daily.  F/u in afib clinic, 11/25/19, with feelings of not being well and being out of rhythm x one week. She has had an UTI and just finished a round of doxycycline. EKG shows atrial flutter  variable rate at 110 bpm.CHA2DS2-VASc Score = 6, continues eliquis 5 mg bid. 1   F/u in  afib clinic, 12/16/18. She had a successful cardioversion but unfortunately felt like it only lasted one day. She is in afib in the 90's. Natasha will try to increase BB and keep amio  at 200 mg a little longer to see if she may return to [place].   F/u  in afib clinic, 01/15/20.  F/u cardioversion 2/17. She was kept on her higher dose of BB at time of cardioversion as she did covert out but with PAC's. She saw [name] one week out and was staying in [place]. She  was left on amiodarone 200 mg bid until f/u here. She called the office earlier in the week and c/o of feeling weak. EKG shows  Sinus brady at 48 bpm. Natasha feel her bradycardia is the reason for her weakness and will titrate meds down today.    F/u 01/22/20. She remains  in SR  in the low 60's but does not feel that much better.  Back on amiodarone 200 mg daily from  BID and lower dose of BB. Marland Kitchen She had some GI distress yesterday and recently had issues with UTI. She  denies urgency or frequency today.  F/u in afib clinic, 4/1, as pt has gone back into afib. Discussed with her Natasha feel that amiodarone is no longer effective and Natasha think it is best to stop drug at this point and rate control. If qt shortens off 3 month amiodarone wash out, currently at 512 ms then tikosyn may be an option down the line. She has multiple cardioversions with significant ERAF.  F/u in afib clinic, 4/14. She is feeling better off amiodarone and she is rate controlled in the 80's but would like to see her a little slower so will increase BB today. We can consider Tikosyn after  3 month amiodarone wash out if qt interval shortens.    She actually is feeling better and had the enegy to do more around her house and some light exercise. She is upset today as she had some diarrhea this am and had an accident in the clinic.Natasha feel this  is why her BP is higher today as she usually  does not have high BP. She feels it may the the plate of food her neighbor gave her last night. She is rate controlled at home in the 70's and 80's.   F/u in afib clinic, 6/29. She remains in rate controlled afib and states that she feels very good. She is exercising without any significant shortness of breath and is rate controlled. No excessive fluid retention. Natasha feel that amiodarone may have been contributing to fatigue and she feels better off drug. She remains on eliquis 5 mg bid for a CHA2DS2VASc score of 6.  Today, she denies symptoms of chest pain, shortness of breath, orthopnea, PND, lower extremity edema, dizziness, presyncope, syncope, or neurologic sequela.Perisistent cough. The patient is tolerating medications without difficulties and is otherwise without complaint today.   Past  Medical History:  Diagnosis Date  . Acute bronchitis 11/15/2017  . ADENOCARCINOMA, BREAST 10/13/2010   Qualifier: Diagnosis of  By: Nils Pyle CMA (AAMA), Mearl Latin    . 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 Allergy    SEASONAL  . Anemia    years ago  . ANEMIA, IRON DEFICIENCY 10/14/2010   Qualifier: Diagnosis of  By: Sharlett Iles MD Byrd Hesselbach   . Anemia, unspecified 10/13/2010   Qualifier: Diagnosis of  By: Nils Pyle CMA ([place]), Mearl Latin    . Anticoagulated by anticoagulation treatment - Eliquis 03/30/2014   Started May 2015   . Anxiety state 10/13/2010   Qualifier: Diagnosis of  By: Nils Pyle CMA ([place]), Mearl Latin    . Arthritis    "all my joints" (03/31/2014)  . Atrial fibrillation ([place])   . Atrial fibrillation with RVR ([place]) 03/31/2015   DLCO dropped from 100% in 2015-50% in 01/2017  . Barrett's esophagus   . Breast cancer [facility])    s/p right breast lumpectomy  . CARDIAC MURMUR 10/13/2010   Qualifier: Diagnosis of  By: Nils Pyle CMA ([place]), Mearl Latin    . 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 Cervical cancer ([place])   . Chronic cough 01/02/2017   GERD, hiatal hernia Doubt amio  . COLONIC POLYPS, ADENOMATOUS, HX OF 10/13/2010   Qualifier: Diagnosis of  By: Nils Pyle CMA (AAMA), Mearl Latin    . Degenerative disorder of eye 07/25/2012  . Diabetes mellitus without complication ([place])   . Dilated cardiomyopathy ([place]) 08/27/2018  . DIVERTICULOSIS, COLON 10/13/2010   Qualifier: Diagnosis of  By: Nils Pyle CMA (AAMA), Mearl Latin    . DJD (degenerative joint disease) of knee   . Dysrhythmia    Afib  . EROSIVE ESOPHAGITIS 10/13/2010   Qualifier: Diagnosis of  By: Nils Pyle CMA ([place]), Mearl Latin    . Error, refractive, myopia 07/09/2012  . Essential hypertension 10/13/2010   Qualifier: Diagnosis of  By: Nils Pyle CMA ([place]), Mearl Latin    . Exotropia, intermittent 07/09/2012  . GERD 10/14/2010   Qualifier: Diagnosis of  By: Sharlett Iles MD Byrd Hesselbach GERD (gastroesophageal reflux disease)   . Glaucoma   . H/O hiatal hernia     . Heart murmur   . Hiatal hernia s/p robotic repair & fundoplication 1/60/7371 04/15/6947   Qualifier: Diagnosis of  By: Nils Pyle CMA (AAMA), Mearl Latin    . History of laser assisted in situ keratomileusis 07/25/2012  . Hx of transesophageal echocardiography (TEE) for monitoring    TEE (03/2014): No LAA clot, moderate LAE, core triatriatum type structure in RA with no stenosis, normal EF 55-60%, mild MR  . HYPERCHOLESTEROLEMIA 10/13/2010   Qualifier: Diagnosis  of  By: Nils Pyle CMA ([place]), Mearl Latin    . Hypertension   . Hypothyroidism, postsurgical   . Irritable bowel syndrome 10/13/2010   Qualifier: Diagnosis of  By: Nils Pyle CMA (AAMA), Mearl Latin    . Lumbar stenosis with neurogenic claudication 04/20/2016  . Malignant neoplasm of breast ([place]) 02/25/2016  . Migraine    "last one was years ago" (03/31/2014)  . MIGRAINE HEADACHE 10/13/2010   Qualifier: Diagnosis of  By: Nils Pyle CMA ([place]), Mearl Latin    . 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 OSA on CPAP    settings at 10-11   . OSTEOARTHRITIS 10/13/2010   Qualifier: Diagnosis of  By: Nils Pyle CMA ([place]), Mearl Latin    . Post-surgical hypothyroidism 04/09/2014  . RECTAL FISSURE 10/13/2010   Qualifier: Diagnosis of  By: Nils Pyle CMA ([place]), Mearl Latin    . S/P Nissen fundoplication (without gastrostomy tube) procedure 05/25/2017  . Sleep apnea   . THYROID CANCER 10/13/2010   Qualifier: Diagnosis of  By: Nils Pyle CMA ([place]), Mearl Latin    . Thyroid cancer [facility])    s/p thyroidectomy  . Type II diabetes mellitus ([place])    type II    Past Surgical History:  Procedure Laterality Date  . ABDOMINAL HYSTERECTOMY     "partial"  . BREAST BIOPSY Right   . BREAST LUMPECTOMY Right   . CARDIOVERSION N/A 03/30/2014   Procedure: CARDIOVERSION - BEDSIDE;  Surgeon: [name], MD;  Location: [place];  Service: Cardiovascular;  Laterality: N/A;  . CARDIOVERSION N/A 03/31/2014   Procedure: CARDIOVERSION  (BEDSIDE) ;  Surgeon: Lelon Perla, MD;  Location: [place];  Service:  Cardiovascular;  Laterality: N/A;  . CARDIOVERSION N/A 04/27/2014   Procedure: CARDIOVERSION;  Surgeon: Darlin Coco, MD;  Location: [place];  Service: Cardiovascular;  Laterality: N/A;  . CARDIOVERSION N/A 04/13/2015   Procedure: CARDIOVERSION;  Surgeon: Thayer Headings, MD;  Location: [place];  Service: Cardiovascular;  Laterality: N/A;  . CARDIOVERSION N/A 01/08/2017   Procedure: CARDIOVERSION;  Surgeon: Sanda Klein, MD;  Location: [facility] ENDOSCOPY;  Service: Cardiovascular;  Laterality: N/A;  . CARDIOVERSION N/A 09/19/2017   Procedure: CARDIOVERSION;  Surgeon: [name], MD;  Location: [place];  Service: Cardiovascular;  Laterality: N/A;  . CARDIOVERSION N/A 10/25/2017   Procedure: CARDIOVERSION;  Surgeon: Acie Fredrickson Wonda Cheng, MD;  Location: [place];  Service: Cardiovascular;  Laterality: N/A;  . CARDIOVERSION N/A 12/27/2018   Procedure: CARDIOVERSION;  Surgeon: [name], MD;  Location: [place];  Service: Cardiovascular;  Laterality: N/A;  . CARDIOVERSION N/A 05/20/2019   Procedure: CARDIOVERSION;  Surgeon: [name], MD;  Location: [facility] ENDOSCOPY;  Service: Cardiovascular;  Laterality: N/A;  . CARDIOVERSION N/A 12/10/2019   Procedure: CARDIOVERSION;  Surgeon: Pixie Casino, MD;  Location: [facility] ENDOSCOPY;  Service: Cardiovascular;  Laterality: N/A;  . CARDIOVERSION N/A 12/31/2019   Procedure: CARDIOVERSION;  Surgeon: Sanda Klein, MD;  Location: [facility] ENDOSCOPY;  Service: Cardiovascular;  Laterality: N/A;  . CARPAL TUNNEL RELEASE Right   . CATARACT EXTRACTION W/ INTRAOCULAR LENS  IMPLANT, BILATERAL Bilateral   . COLONOSCOPY    . EXCISIONAL HEMORRHOIDECTOMY    . INSERTION OF MESH N/A 05/25/2017   Procedure: INSERTION OF MESH;  Surgeon: Ralene Ok, MD;  Location: [facility];  Service: General;  Laterality: N/A;  . TEE WITHOUT CARDIOVERSION N/A 03/30/2014   Procedure: TRANSESOPHAGEAL ECHOCARDIOGRAM (TEE);  Surgeon: [name], MD;  Location: [place];  Service: Cardiovascular;  Laterality: N/A;  .  THYROIDECTOMY    . TONSILLECTOMY      Current Outpatient Medications  Medication Sig Dispense Refill  . ACCU-CHEK GUIDE test strip     . acetaminophen (TYLENOL) 650 MG CR tablet Take 650 mg by mouth every 8 (eight) hours.     Marland Kitchen apixaban (ELIQUIS) 5 MG TABS tablet Take 1 tablet (5 mg total) by mouth 2 (two) times daily. 180 tablet 2  . Blood Glucose Monitoring Suppl (ACCU-CHEK GUIDE ME) w/Device KIT     . calcium carbonate (OSCAL) 1500 (600 Ca) MG TABS tablet Take 600 mg of elemental calcium by mouth daily.    . cetirizine (ZYRTEC) 10 MG tablet Take 10 mg by mouth daily.     . Cholecalciferol (DIALYVITE VITAMIN D 5000) 125 MCG (5000 UT) capsule Take 5,000 Units by mouth daily.    . ferrous sulfate 325 (65 FE) MG tablet Take 325 mg by mouth daily with breakfast.    . fluticasone (FLONASE) 50 MCG/ACT nasal spray Place 1 spray into both nostrils at bedtime.     . lansoprazole (PREVACID) 30 MG capsule Take 30 mg by mouth daily.     Marland Kitchen levothyroxine (SYNTHROID) 137 MCG tablet Take 137 mcg by mouth daily.     Marland Kitchen LORazepam (ATIVAN) 1 MG tablet Take 1.5 mg by mouth at bedtime.     . Lutein 6 MG CAPS Take 6 mg by mouth once a week.     . metFORMIN (GLUCOPHAGE-XR) 500 MG 24 hr tablet Take 500 mg by mouth 2 (two) times daily.     . metoprolol tartrate 37.5 MG TABS Take one tablet by mouth twice daily 270 tablet 1  . Multiple Vitamins-Minerals (PRESERVISION AREDS 2) CAPS Take 1 capsule by mouth daily.    . Multiple Vitamins-Minerals (ZINC PO) Take 0.5 tablets by mouth 2 (two) times a week.     . Polyvinyl Alcohol-Povidone PF (REFRESH) 1.4-0.6 % SOLN Place 1 drop into both eyes daily as needed (dry eyes).     . potassium chloride SA (KLOR-CON) 20 MEQ tablet Take 1 tablet (20 mEq total) by mouth daily. 90 tablet 2  . rosuvastatin (CRESTOR) 10 MG tablet Take 10 mg by mouth at bedtime.    . sertraline (ZOLOFT) 50 MG tablet Take 50 mg by mouth daily.     . vitamin B-12 (CYANOCOBALAMIN) 1000 MCG tablet Take 1,000 mcg by mouth as needed (energy).      No current facility-administered medications for this encounter.    Allergies  Allergen Reactions  . Penicillins Itching, Rash and Other (See Comments)    Did it involve swelling of the face/tongue/throat, SOB, or low BP? No Did it involve sudden or severe rash/hives, skin peeling, or any reaction on the inside of your mouth or nose? No Did you need to seek medical attention at a hospital or doctor's office? No When did it last happen?30+ years If all above answers are "NO", may proceed with cephalosporin use.     Social History   Socioeconomic History  . Marital status: Divorced    Spouse name: Not on file  . Number of children: 2  . Years of education: Not on file  . Highest education level: Not on file  Occupational History  . Occupation: Retired    Comment: Education officer, museum  Tobacco Use  . Smoking status: Never Smoker  . Smokeless tobacco: Never Used  Vaping Use  . Vaping Use: Never used  Substance and Sexual Activity  . Alcohol use: No  Alcohol/week: 0.0 standard drinks  . Drug use: No  . Sexual activity: Never  Other Topics Concern  . Not on file  Social History Narrative  . Not on file   Social Determinants of Health   Financial Resource Strain:   . Difficulty of Paying Living Expenses:   Food Insecurity:   . Worried About Charity fundraiser in the Last Year:   . Arboriculturist in the Last Year:   Transportation Needs:   . Film/video editor (Medical):   Marland Kitchen Lack of Transportation (Non-Medical):   Physical Activity:   . Days of Exercise per Week:   . Minutes of Exercise per Session:   Stress:   . Feeling of Stress :   Social Connections:   . Frequency of Communication with Friends and Family:   . Frequency of Social Gatherings with Friends and Family:   . Attends Religious Services:   . Active Member of Clubs or Organizations:   . Attends  Archivist Meetings:   Marland Kitchen Marital Status:   Intimate Partner Violence:   . Fear of Current or Ex-Partner:   . Emotionally Abused:   Marland Kitchen Physically Abused:   . Sexually Abused:     Family History  Problem Relation Age of Onset  . Prostate cancer Father   . Diabetes Sister   . Heart disease Sister   . Heart attack Brother   . Heart disease Brother   . Diabetes Brother   . Diabetes Sister   . Diabetes Sister   . Pancreatic cancer Sister   . Diabetes Brother   . Heart disease Brother   . Diabetes Brother   . Heart disease Brother   . Diabetes Brother   . Heart disease Brother   . Diabetes Brother   . Heart disease Brother   . Diabetes Brother   . Heart disease Brother   . Diabetes Brother   . Diabetes Child     ROS- All systems are reviewed and negative except as per the HPI above  Physical Exam: Vitals:   05/11/20 1018  BP: 136/80  Pulse: 98  Weight: 80.4 kg  Height: '5\' 6"'$  (1.676 m)   Wt Readings from Last 3 Encounters:  05/11/20 80.4 kg  03/10/20 81.8 kg  02/25/20 83 kg    Labs: Lab Results  Component Value Date   NA 139 12/03/2019   K 4.4 12/03/2019   CL 106 12/03/2019   CO2 21 (L) 12/03/2019   GLUCOSE 120 (H) 12/03/2019   BUN 18 12/03/2019   CREATININE 1.11 (H) 12/03/2019   CALCIUM 9.1 12/03/2019   MG 2.2 06/07/2017   Lab Results  Component Value Date   INR 1.12 04/20/2016   No results found for: CHOL, HDL, LDLCALC, TRIG   GEN- The patient is well appearing, alert and oriented x 3 today.   Head- normocephalic, atraumatic Eyes-  Sclera clear, conjunctiva pink Ears- hearing intact Oropharynx- clear Neck- supple, no JVP Lymph- no cervical lymphadenopathy Lungs- Clear to ausculation bilaterally, normal work of breathing Heart- irregular rate and rhythm, no murmurs, rubs or gallops, PMI not laterally displaced GI- soft, NT, ND, + BS Extremities- no clubbing, cyanosis, or edema MS- no significant deformity or atrophy Skin- no rash or  lesion Psych- euthymic mood, full affect Neuro- strength and sensation are intact  EKG- a flutter  at 98 bpm,  qrs int 100 ms, qt int 449   ms  Epic records reviewed   Assessment and  Plan: 1. Persistent afib Multiple cardioversion's in the past  Last cardioversion, 2/17, was successful but  with significant  weakness 2/2 bradycardia Sinus brady resolved with reduction of BB and amiodarone redcued  back to 200 mg qd  However returned to  afib  Discussed on previous  visit  with pt Natasha feel that amiodarone is no longer effective to maintain SR and Natasha can not keep her at 200 mg bid as she may get toxic on it. Natasha think it is best to stop drug and rate control  Continue metoprolol to 25 (1 1/2 mg)  bid  She actually is feeling better off amiodarone and being  rate controlled  She may be a Tikosyn candidate after 3 month amio washout if qt shortens and she does not tolerate rate control    2. HTN Stable   3. CHA2DS2VASc score of 6 Eliquis 5 mg bid   F/u in afib clinic in 3 months    Butch Penny C. Dale Ribeiro, [facility] [address] [place], [place] [address] [phone]

## 2020-07-06 NOTE — Progress Notes (Signed)
Cardiology Office Note:    Date:  07/07/2020   ID:  [name], DOB 08-21-1931, MRN [id number]  PCP:  Lavone Orn, MD  Cardiologist:  Sherren Mocha, MD   Electrophysiologist:  None   Referring MD: Lavone Orn, MD   Chief Complaint:  Atrial Fibrillation    Patient Profile:    [name] is a 86 y.o. female with:   Persistent atrial fibrillation   Amiodarone Rx (2015)  S/p recurrent AFib >> repeat DCCVs in early 2021 (last 12/2019)  Amiodarone dose increased  Followed in AFib Clinic  Amiodarone DC'd in 02/2020 (due to ERAF)  Rate control strategy; consider Dofetilide after Amio washout   Non-ischemic cardiomyopathy (tachycardia induced)  Echocardiogram 11/18: EF 40-45  Echocardiogram 6/19: EF 65-70  Breast CA  Anemia  Diabetes mellitus   Hypertension   GERD  Hyperlipidemia   Thyroid CA s/p thyroidectomy   Hypothyroidism   OSA  Prior CV studies: Echocardiogram 04/24/2018 Moderate concentric LVH, EF 65-70, normal wall motion, grade 2 diastolic dysfunction, mild AI, mild MR, moderate LAE, mild RAE, mild TR, PASP 40  Echocardiogram 10/11/2017 Moderate LVH, severe focal basal hypertrophy of the septum to EF 40-45, diffuse HK, aortic sclerosis without stenosis, MAC, severe LAE, moderate TR, PASP 33  TEE (03/2014): No LAA clot, moderate LAE, core triatriatum type structure in RA with no stenosis, normal EF 55-60%, mild MR  History of Present Illness:    [name] was last seen by [name] in 12/2019.  She has been followed closely in the AFib clinic.  She was noted to have recurrent AFib in 02/2020.  She was taken off of Amiodarone.  Rate control strategy has been employed since then and the patient has done well.  Dofetilide can be considered if her QT shortens after Amiodarone washout.    She returns for follow up.   She is here alone.  She notes that she is feeling awful.  She feels weak.  She tells me that she is exercising but she  also notes that she is so weak it is difficult to get dressed in the mornings.  She is short of breath at times.  She sleeps on 2 pillows chronically.  She has not had PND or significant leg swelling.  She has not had chest pain or syncope.  She has been taking her Apixaban without interruption.  Past Medical History:  Diagnosis Date  . Acute bronchitis 11/15/2017  . ADENOCARCINOMA, BREAST 10/13/2010   Qualifier: Diagnosis of  By: Nils Pyle CMA (AAMA), Mearl Latin    . 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 Allergy    SEASONAL  . Anemia    years ago  . ANEMIA, IRON DEFICIENCY 10/14/2010   Qualifier: Diagnosis of  By: Sharlett Iles MD Byrd Hesselbach   . Anemia, unspecified 10/13/2010   Qualifier: Diagnosis of  By: Nils Pyle CMA ([place]), Mearl Latin    . Anticoagulated by anticoagulation treatment - Eliquis 03/30/2014   Started May 2015   . Anxiety state 10/13/2010   Qualifier: Diagnosis of  By: Nils Pyle CMA ([place]), Mearl Latin    . Arthritis    "all my joints" (03/31/2014)  . Atrial fibrillation ([place])   . Atrial fibrillation with RVR ([place]) 03/31/2015   DLCO dropped from 100% in 2015-50% in 01/2017  . Barrett's esophagus   . Breast cancer [facility]    s/p right breast lumpectomy  . CARDIAC MURMUR 10/13/2010   Qualifier: Diagnosis of  By: Nils Pyle CMA ([place]), Mearl Latin    .  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 Cervical cancer ([place])   . Chronic cough 01/02/2017   GERD, hiatal hernia [place] amio  . COLONIC POLYPS, ADENOMATOUS, HX OF 10/13/2010   Qualifier: Diagnosis of  By: Nils Pyle CMA (AAMA), Mearl Latin    . Degenerative disorder of eye 07/25/2012  . Diabetes mellitus without complication ([place])   . Dilated cardiomyopathy ([place]) 08/27/2018  . DIVERTICULOSIS, COLON 10/13/2010   Qualifier: Diagnosis of  By: Nils Pyle CMA (AAMA), Mearl Latin    . DJD (degenerative joint disease) of knee   . Dysrhythmia    Afib  . EROSIVE ESOPHAGITIS 10/13/2010   Qualifier: Diagnosis of  By: Nils Pyle CMA ([place]), Mearl Latin    . Error, refractive, myopia  07/09/2012  . Essential hypertension 10/13/2010   Qualifier: Diagnosis of  By: Nils Pyle CMA ([place]), Mearl Latin    . Exotropia, intermittent 07/09/2012  . GERD 10/14/2010   Qualifier: Diagnosis of  By: Sharlett Iles MD Byrd Hesselbach GERD (gastroesophageal reflux disease)   . Glaucoma   . H/O hiatal hernia   . Heart murmur   . Hiatal hernia s/p robotic repair & fundoplication 8/56/3149 70/12/6376   Qualifier: Diagnosis of  By: Nils Pyle CMA (AAMA), Mearl Latin    . History of laser assisted in situ keratomileusis 07/25/2012  . Hx of transesophageal echocardiography (TEE) for monitoring    TEE (03/2014): No LAA clot, moderate LAE, core triatriatum type structure in RA with no stenosis, normal EF 55-60%, mild MR  . HYPERCHOLESTEROLEMIA 10/13/2010   Qualifier: Diagnosis of  By: Nils Pyle CMA ([place]), Mearl Latin    . Hypertension   . Hypothyroidism, postsurgical   . Irritable bowel syndrome 10/13/2010   Qualifier: Diagnosis of  By: Nils Pyle CMA (AAMA), Mearl Latin    . Lumbar stenosis with neurogenic claudication 04/20/2016  . Malignant neoplasm of breast ([place]) 02/25/2016  . Migraine    "last one was years ago" (03/31/2014)  . MIGRAINE HEADACHE 10/13/2010   Qualifier: Diagnosis of  By: Nils Pyle CMA ([place]), Mearl Latin    . 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 OSA on CPAP    settings at 10-11   . OSTEOARTHRITIS 10/13/2010   Qualifier: Diagnosis of  By: Nils Pyle CMA ([place]), Mearl Latin    . Post-surgical hypothyroidism 04/09/2014  . RECTAL FISSURE 10/13/2010   Qualifier: Diagnosis of  By: Nils Pyle CMA ([place]), Mearl Latin    . S/P Nissen fundoplication (without gastrostomy tube) procedure 05/25/2017  . Sleep apnea   . THYROID CANCER 10/13/2010   Qualifier: Diagnosis of  By: Nils Pyle CMA ([place]), Mearl Latin    . Thyroid cancer [facility])    s/p thyroidectomy  . Type II diabetes mellitus ([place])    type II     Current Medications: Current Meds  Medication Sig  . ACCU-CHEK GUIDE test strip   . acetaminophen (TYLENOL) 650 MG CR tablet Take 650 mg  by mouth every 8 (eight) hours.   Marland Kitchen apixaban (ELIQUIS) 5 MG TABS tablet Take 1 tablet (5 mg total) by mouth 2 (two) times daily.  . Blood Glucose Monitoring Suppl (ACCU-CHEK GUIDE ME) w/Device KIT   . calcium carbonate (OSCAL) 1500 (600 Ca) MG TABS tablet Take 600 mg of elemental calcium by mouth daily.  . cetirizine (ZYRTEC) 10 MG tablet Take 10 mg by mouth daily.   . Cholecalciferol (DIALYVITE VITAMIN D 5000) 125 MCG (5000 UT) capsule Take 5,000 Units by mouth daily.  . ferrous sulfate 325 (65 FE) MG tablet Take 325 mg by mouth daily with breakfast.  .  fluticasone (FLONASE) 50 MCG/ACT nasal spray Place 1 spray into both nostrils at bedtime.   . lansoprazole (PREVACID) 30 MG capsule Take 30 mg by mouth daily.   Marland Kitchen levothyroxine (SYNTHROID) 137 MCG tablet Take 137 mcg by mouth daily.   Marland Kitchen LORazepam (ATIVAN) 1 MG tablet Take 1.5 mg by mouth at bedtime.   . Lutein 6 MG CAPS Take 6 mg by mouth once a week.   . metFORMIN (GLUCOPHAGE-XR) 500 MG 24 hr tablet Take 500 mg by mouth 2 (two) times daily.   . metoprolol tartrate 37.5 MG TABS Take one tablet by mouth twice daily  . Multiple Vitamins-Minerals (PRESERVISION AREDS 2) CAPS Take 1 capsule by mouth daily.  . Multiple Vitamins-Minerals (ZINC PO) Take 0.5 tablets by mouth 2 (two) times a week.   . Polyvinyl Alcohol-Povidone PF (REFRESH) 1.4-0.6 % SOLN Place 1 drop into both eyes daily as needed (dry eyes).   . potassium chloride SA (KLOR-CON) 20 MEQ tablet Take 1 tablet (20 mEq total) by mouth daily.  . rosuvastatin (CRESTOR) 10 MG tablet Take 10 mg by mouth at bedtime.  . sertraline (ZOLOFT) 50 MG tablet Take 50 mg by mouth daily.  . vitamin B-12 (CYANOCOBALAMIN) 1000 MCG tablet Take 1,000 mcg by mouth as needed (energy).      Allergies:   Penicillins   Social History   Tobacco Use  . Smoking status: Never Smoker  . Smokeless tobacco: Never Used  Vaping Use  . Vaping Use: Never used  Substance Use Topics  . Alcohol use: No     Alcohol/week: 0.0 standard drinks  . Drug use: No     Family Hx: The patient's family history includes Diabetes in her brother, brother, brother, brother, brother, brother, brother, child, sister, sister, and sister; Heart attack in her brother; Heart disease in her brother, brother, brother, brother, brother, brother, and sister; Pancreatic cancer in her sister; Prostate cancer in her father.  ROS   EKGs/Labs/Other Test Reviewed:    EKG:  EKG is  ordered today.  The ekg ordered today demonstrates atrial fibrillation, HR 95, normal axis, IVCD, QTC 497  Recent Labs: 12/03/2019: BUN 18; Creatinine, Ser 1.11; Hemoglobin 13.8; Platelets 166; Potassium 4.4; Sodium 139 01/07/2020: TSH 0.745   Recent Lipid Panel No results found for: CHOL, TRIG, HDL, CHOLHDL, LDLCALC, LDLDIRECT  Physical Exam:    VS:  BP 110/60   Pulse 95   Ht _0  (1.676 m)   Wt 175 lb (79.4 kg)   LMP  (LMP Unknown)   SpO2 98%   BMI 28.25 kg/m     Wt Readings from Last 3 Encounters:  07/07/20 175 lb (79.4 kg)  05/11/20 177 lb 3.2 oz (80.4 kg)  03/10/20 180 lb 6.4 oz (81.8 kg)     Constitutional:      Appearance: Healthy appearance. Not in distress.  Pulmonary:     Effort: Pulmonary effort is normal.     Breath sounds: No wheezing. No rales.  Cardiovascular:     Normal rate. Irregularly irregular rhythm. Normal S1. Normal S2.     Murmurs: There is no murmur.  Edema:    Peripheral edema absent.  Abdominal:     Palpations: Abdomen is soft.  Musculoskeletal:     Cervical back: Neck supple. Skin:    General: Skin is warm and dry.  Neurological:     Mental Status: Alert and oriented to person, place and time.     Cranial Nerves: Cranial nerves are intact.  ASSESSMENT & PLAN:    1. Persistent atrial fibrillation ([place]) She has been back in atrial fibrillation since the end of March.  She has been off of amiodarone since that time.  Rate control strategy has been used.  She has been followed closely  by Roderic Palau, NP in the AF Clinic.  It has been noted that dofetilide could be considered once her QT shortens after amiodarone washout.  It appears initially, the patient was feeling better off of amiodarone despite being in atrial fibrillation.  However, she tells me today that she has been feeling off for the past few months.  I will obtain a follow-up echocardiogram as she has had reduced LV function in the past with amiodarone.  It appears her heart rate is well controlled at this time.  I will obtain an amiodarone level get her into see Butch Penny sooner to hopefully consider initiating dofetilide.  Her QT is shorter.  Hopefully, dofetilide will be an option.  2. Secondary hypercoagulable state ([place]) Continue anticoagulation with Apixaban 5 mg twice daily.   3. Dilated cardiomyopathy ([place]) As noted, she has had reduced LV function in the past with atrial fibrillation.  Her most recent echocardiogram in 2019 demonstrated normal LV function.  Obtain follow-up echocardiogram as noted above.  4. Essential hypertension The patient's blood pressure is controlled on her current regimen.  Continue current therapy.     Dispo:  Return in about 6 months (around 01/07/2021) for Routine Follow Up, w/ [name], in person.   Medication Adjustments/Labs and Tests Ordered: Current medicines are reviewed at length with the patient today.  Concerns regarding medicines are outlined above.  Tests Ordered: Orders Placed This Encounter  Procedures  . Amiodarone level  . EKG 12-Lead  . ECHOCARDIOGRAM COMPLETE   Medication Changes: No orders of the defined types were placed in this encounter.   Signed, Richardson Dopp, PA-C  07/07/2020 11:05 AM    [facility] [address], [place], [place]  [address] Phone: [phone]; Fax: [phone]

## 2020-07-07 ENCOUNTER — Encounter: Payer: Self-pay | Admitting: Physician Assistant

## 2020-07-07 ENCOUNTER — Other Ambulatory Visit: Payer: Self-pay

## 2020-07-07 ENCOUNTER — Ambulatory Visit: Payer: Medicare PPO | Admitting: Physician Assistant

## 2020-07-07 VITALS — BP 110/60 | HR 95 | Ht 66.0 in | Wt 175.0 lb

## 2020-07-07 DIAGNOSIS — D6869 Other thrombophilia: Secondary | ICD-10-CM | POA: Diagnosis not present

## 2020-07-07 DIAGNOSIS — I420 Dilated cardiomyopathy: Secondary | ICD-10-CM

## 2020-07-07 DIAGNOSIS — I10 Essential (primary) hypertension: Secondary | ICD-10-CM | POA: Diagnosis not present

## 2020-07-07 DIAGNOSIS — I4819 Other persistent atrial fibrillation: Secondary | ICD-10-CM

## 2020-07-07 NOTE — Patient Instructions (Signed)
Medication Instructions:  Your physician recommends that you continue on your current medications as directed. Please refer to the Current Medication list given to you today.  *If you need a refill on your cardiac medications before your next appointment, please call your pharmacy*  Lab Work: You will have labs drawn today: Amiodarone level  If you have labs (blood work) drawn today and your tests are completely normal, you will receive your results only by: Marland Kitchen MyChart Message (if you have MyChart) OR . A paper copy in the mail If you have any lab test that is abnormal or we need to change your treatment, we will call you to review the results.  Testing/Procedures: Your physician has requested that you have an echocardiogram. Echocardiography is a painless test that uses sound waves to create images of your heart. It provides your doctor with information about the size and shape of your heart and how well your heart's chambers and valves are working. This procedure takes approximately one hour. There are no restrictions for this procedure.  Follow-Up: On 01/11/20 at 10:20AM with Sherren Mocha, MD

## 2020-07-07 NOTE — Addendum Note (Signed)
Addended by: Mady Haagensen on: 07/07/2020 11:22 AM   Modules accepted: Orders

## 2020-07-13 LAB — AMIODARONE (CORDARONE), S/P
AMIODARONE: 141 ng/mL — ABNORMAL LOW (ref 1000–2500)
DESETHYLAMIODARONE: 191 ng/mL

## 2020-07-21 ENCOUNTER — Encounter: Payer: Self-pay | Admitting: Physician Assistant

## 2020-07-21 ENCOUNTER — Ambulatory Visit (HOSPITAL_COMMUNITY): Payer: Medicare PPO | Attending: Cardiology

## 2020-07-21 ENCOUNTER — Other Ambulatory Visit: Payer: Self-pay

## 2020-07-21 DIAGNOSIS — I420 Dilated cardiomyopathy: Secondary | ICD-10-CM | POA: Diagnosis not present

## 2020-07-21 DIAGNOSIS — I4819 Other persistent atrial fibrillation: Secondary | ICD-10-CM | POA: Diagnosis not present

## 2020-07-21 LAB — ECHOCARDIOGRAM COMPLETE
Area-P 1/2: 3.56 cm2
P 1/2 time: 474 msec
S' Lateral: 2.5 cm

## 2020-07-28 ENCOUNTER — Ambulatory Visit (HOSPITAL_COMMUNITY): Payer: Medicare PPO | Admitting: Nurse Practitioner

## 2020-07-28 ENCOUNTER — Other Ambulatory Visit: Payer: Self-pay | Admitting: Cardiovascular Disease

## 2020-07-29 MED ORDER — POTASSIUM CHLORIDE CRYS ER 20 MEQ PO TBCR: 20.0000 meq | EXTENDED_RELEASE_TABLET | Freq: Every day | ORAL | 3 refills | Status: DC

## 2020-08-04 ENCOUNTER — Encounter (HOSPITAL_COMMUNITY): Payer: Self-pay | Admitting: Nurse Practitioner

## 2020-08-04 ENCOUNTER — Ambulatory Visit (HOSPITAL_COMMUNITY)
Admission: RE | Admit: 2020-08-04 | Discharge: 2020-08-04 | Disposition: A | Discharge status: Home / Self Care | Payer: Medicare PPO | Source: Ambulatory Visit | Attending: Nurse Practitioner | Admitting: Nurse Practitioner

## 2020-08-04 ENCOUNTER — Other Ambulatory Visit: Payer: Self-pay

## 2020-08-04 VITALS — BP 144/82 | HR 87 | Ht 66.0 in | Wt 174.6 lb

## 2020-08-04 DIAGNOSIS — Z7901 Long term (current) use of anticoagulants: Secondary | ICD-10-CM | POA: Insufficient documentation

## 2020-08-04 DIAGNOSIS — F419 Anxiety disorder, unspecified: Secondary | ICD-10-CM | POA: Insufficient documentation

## 2020-08-04 DIAGNOSIS — E890 Postprocedural hypothyroidism: Secondary | ICD-10-CM | POA: Diagnosis not present

## 2020-08-04 DIAGNOSIS — D509 Iron deficiency anemia, unspecified: Secondary | ICD-10-CM | POA: Diagnosis not present

## 2020-08-04 DIAGNOSIS — K2270 Barrett's esophagus without dysplasia: Secondary | ICD-10-CM | POA: Diagnosis not present

## 2020-08-04 DIAGNOSIS — Z79890 Hormone replacement therapy (postmenopausal): Secondary | ICD-10-CM | POA: Insufficient documentation

## 2020-08-04 DIAGNOSIS — M1990 Unspecified osteoarthritis, unspecified site: Secondary | ICD-10-CM | POA: Insufficient documentation

## 2020-08-04 DIAGNOSIS — Z79899 Other long term (current) drug therapy: Secondary | ICD-10-CM | POA: Diagnosis not present

## 2020-08-04 DIAGNOSIS — G4733 Obstructive sleep apnea (adult) (pediatric): Secondary | ICD-10-CM | POA: Insufficient documentation

## 2020-08-04 DIAGNOSIS — D6869 Other thrombophilia: Secondary | ICD-10-CM | POA: Diagnosis not present

## 2020-08-04 DIAGNOSIS — E119 Type 2 diabetes mellitus without complications: Secondary | ICD-10-CM | POA: Insufficient documentation

## 2020-08-04 DIAGNOSIS — I4819 Other persistent atrial fibrillation: Secondary | ICD-10-CM

## 2020-08-04 DIAGNOSIS — Z7984 Long term (current) use of oral hypoglycemic drugs: Secondary | ICD-10-CM | POA: Diagnosis not present

## 2020-08-04 DIAGNOSIS — I10 Essential (primary) hypertension: Secondary | ICD-10-CM | POA: Insufficient documentation

## 2020-08-04 DIAGNOSIS — K2100 Gastro-esophageal reflux disease with esophagitis, without bleeding: Secondary | ICD-10-CM | POA: Insufficient documentation

## 2020-08-04 MED ORDER — METOPROLOL TARTRATE 50 MG PO TABS: 50.0000 mg | ORAL_TABLET | Freq: Two times a day (BID) | ORAL | Status: DC

## 2020-08-04 NOTE — Progress Notes (Signed)
Primary Care Physician: Lavone Orn, MD Cardiologist: [name] Pulmonologist: Dr. Elsworth Soho GI: Dr. Dolphus Jenny [name] is a 86 y.o. female with a h/o afib that has been on amiodarone since June of 2015. She has had many breakthrough events, last one in February with successful cardioversion. She does feel very weak in afib. She went into afib around 2 weeks ago and a RN came out to her house on Friday and documented the afib.  Dr. Rayann Heman was called and left instructions to increase amio to 200 mg bid and stop losartan. Apparently her BP was low on that day.  She is in now in the afib clinc and she is rate controlled and BP is stable. She reports around 2-3 weeks ago she first had 2 teeth pulled and then a shot of cortisone in her knee. She hasn't felt well since. She usually requires a cardioversion to restore SR.    F/u in afib clinic 7/15 after successful cardioversion. She is in SR today and feels improved. She is back to Amio 200 mg daily.  F/u in afib clinic, 11/25/19, with feelings of not being well and being out of rhythm x one week. She has had an UTI and just finished a round of doxycycline. EKG shows atrial flutter  variable rate at 110 bpm.CHA2DS2-VASc Score = 6, continues eliquis 5 mg bid. 1   F/u in  afib clinic, 12/16/18. She had a successful cardioversion but unfortunately felt like it only lasted one day. She is in afib in the 90's. Natasha will try to increase BB and keep amio  at 200 mg a little longer to see if she may return to [place].   F/u  in afib clinic, 01/15/20.  F/u cardioversion 2/17. She was kept on her higher dose of BB at time of cardioversion as she did covert out but with PAC's. She saw [name] one week out and was staying in [place]. She  was left on amiodarone 200 mg bid until f/u here. She called the office earlier in the week and c/o of feeling weak. EKG shows  Sinus brady at 48 bpm. Natasha feel her bradycardia is the reason for her weakness and will titrate meds down today.    F/u 01/22/20. She remains  in SR  in the low 60's but does not feel that much better.  Back on amiodarone 200 mg daily from  BID and lower dose of BB. Marland Kitchen She had some GI distress yesterday and recently had issues with UTI. She  denies urgency or frequency today.  F/u in afib clinic, 4/1, as pt has gone back into afib. Discussed with her Natasha feel that amiodarone is no longer effective and Natasha think it is best to stop drug at this point and rate control. If qt shortens off 3 month amiodarone wash out, currently at 512 ms then tikosyn may be an option down the line. She has multiple cardioversions with significant ERAF.  F/u in afib clinic, 4/14. She is feeling better off amiodarone and she is rate controlled in the 80's but would like to see her a little slower so will increase BB today. We can consider Tikosyn after  3 month amiodarone wash out if qt interval shortens.    She actually is feeling better and had the enegy to do more around her house and some light exercise. She is upset today as she had some diarrhea this am and had an accident in the clinic.Natasha feel this  is why her BP is higher today as she usually  does not have high BP. She feels it may the the plate of food her neighbor gave her last night. She is rate controlled at home in the 70's and 80's.   F/u in afib clinic, 6/29. She remains in rate controlled afib and states that she feels very good. She is exercising without any significant shortness of breath and is rate controlled. No excessive fluid retention. Natasha feel that amiodarone may have been contributing to fatigue and she feels better off drug. She remains on eliquis 5 mg bid for a CHA2DS2VASc score of 6.  F/u in afib clinic, 08/04/20. She  remains in rate controlled afib and initially tells me that she is exercising and feels great, but then tells me see feels bad and would love to get back in rhythm. QT corrected around 430 ms. She has now been off amiodarone x 5 months.   Today, she  denies symptoms of chest pain, shortness of breath, orthopnea, PND, lower extremity edema, dizziness, presyncope, syncope, or neurologic sequela.Perisistent cough. The patient is tolerating medications without difficulties and is otherwise without complaint today.   Past Medical History:  Diagnosis Date  . Acute bronchitis 11/15/2017  . ADENOCARCINOMA, BREAST 10/13/2010   Qualifier: Diagnosis of  By: Nils Pyle CMA (AAMA), Mearl Latin    . 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 Allergy    SEASONAL  . Anemia    years ago  . Anticoagulated by anticoagulation treatment - Eliquis 03/30/2014   Started May 2015   . Anxiety state 10/13/2010   Qualifier: Diagnosis of  By: Nils Pyle CMA ([place]), Mearl Latin    . Arthritis    "all my joints" (03/31/2014)  . Barrett's esophagus   . Breast cancer [facility])    s/p right breast lumpectomy  . CARDIAC MURMUR 10/13/2010   Qualifier: Diagnosis of  By: Nils Pyle CMA ([place]), Mearl Latin    . 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 Cervical cancer ([place])   . Chronic cough 01/02/2017   GERD, hiatal hernia Doubt amio  . COLONIC POLYPS, ADENOMATOUS, HX OF 10/13/2010   Qualifier: Diagnosis of  By: Nils Pyle CMA (AAMA), Mearl Latin    . Degenerative disorder of eye 07/25/2012  . Diabetes mellitus without complication ([place])   . Dilated cardiomyopathy ([place]) 08/27/2018  . DIVERTICULOSIS, COLON 10/13/2010   Qualifier: Diagnosis of  By: Nils Pyle CMA (AAMA), Mearl Latin    . DJD (degenerative joint disease) of knee   . Dysrhythmia    Afib  . Echocardiogram 07/2020    Echo 9/21: EF 70-75, moderate LVH, GR 1 DD, RVSP 40.5 (mildly elevated), normal RVSF, moderate LAE, mild RAE, mild MR, trivial AI  . EROSIVE ESOPHAGITIS 10/13/2010   Qualifier: Diagnosis of  By: Nils Pyle CMA ([place]), Mearl Latin    . Error, refractive, myopia 07/09/2012  . Essential hypertension 10/13/2010   Qualifier: Diagnosis of  By: Nils Pyle CMA ([place]), Mearl Latin    . Exotropia, intermittent 07/09/2012  . GERD 10/14/2010   Qualifier: Diagnosis of   By: Sharlett Iles MD Byrd Hesselbach GERD (gastroesophageal reflux disease)   . Glaucoma   . H/O hiatal hernia   . Heart murmur   . Hiatal hernia s/p robotic repair & fundoplication 07/14/5175 16/0/7371   Qualifier: Diagnosis of  By: Nils Pyle CMA (AAMA), Mearl Latin    . History of laser assisted in situ keratomileusis 07/25/2012  . Hx of transesophageal echocardiography (TEE) for monitoring    TEE (03/2014): No LAA clot, moderate LAE,  core triatriatum type structure in RA with no stenosis, normal EF 55-60%, mild MR  . HYPERCHOLESTEROLEMIA 10/13/2010   Qualifier: Diagnosis of  By: Nils Pyle CMA ([place]), Mearl Latin    . Hypertension   . Hypothyroidism, postsurgical   . Iron Deficiency Anemia 10/14/2010   Qualifier: Diagnosis of  By: Sharlett Iles MD Byrd Hesselbach   . Irritable bowel syndrome 10/13/2010   Qualifier: Diagnosis of  By: Nils Pyle CMA (AAMA), Mearl Latin    . Lumbar stenosis with neurogenic claudication 04/20/2016  . Malignant neoplasm of breast ([place]) 02/25/2016  . Migraine    "last one was years ago" (03/31/2014)  . MIGRAINE HEADACHE 10/13/2010   Qualifier: Diagnosis of  By: Nils Pyle CMA ([place]), Mearl Latin    . 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 OSA on CPAP    settings at 10-11   . OSTEOARTHRITIS 10/13/2010   Qualifier: Diagnosis of  By: Nils Pyle CMA ([place]), Mearl Latin    . Persistent atrial fibrillation 03/31/2015   DLCO dropped from 100% in 2015-50% in 01/2017  . Post-surgical hypothyroidism 04/09/2014  . RECTAL FISSURE 10/13/2010   Qualifier: Diagnosis of  By: Nils Pyle CMA ([place]), Mearl Latin    . S/P Nissen fundoplication (without gastrostomy tube) procedure 05/25/2017  . Sleep apnea   . THYROID CANCER 10/13/2010   Qualifier: Diagnosis of  By: Nils Pyle CMA ([place]), Mearl Latin    . Thyroid cancer [facility])    s/p thyroidectomy  . Type II diabetes mellitus ([place])    type II    Past Surgical History:  Procedure Laterality Date  . ABDOMINAL HYSTERECTOMY     "partial"  . BREAST BIOPSY Right   . BREAST LUMPECTOMY  Right   . CARDIOVERSION N/A 03/30/2014   Procedure: CARDIOVERSION - BEDSIDE;  Surgeon: [name], MD;  Location: [facility];  Service: Cardiovascular;  Laterality: N/A;  . CARDIOVERSION N/A 03/31/2014   Procedure: CARDIOVERSION  (BEDSIDE) ;  Surgeon: Lelon Perla, MD;  Location: [place];  Service: Cardiovascular;  Laterality: N/A;  . CARDIOVERSION N/A 04/27/2014   Procedure: CARDIOVERSION;  Surgeon: Darlin Coco, MD;  Location: [place];  Service: Cardiovascular;  Laterality: N/A;  . CARDIOVERSION N/A 04/13/2015   Procedure: CARDIOVERSION;  Surgeon: Thayer Headings, MD;  Location: [place];  Service: Cardiovascular;  Laterality: N/A;  . CARDIOVERSION N/A 01/08/2017   Procedure: CARDIOVERSION;  Surgeon: Sanda Klein, MD;  Location: [facility] ENDOSCOPY;  Service: Cardiovascular;  Laterality: N/A;  . CARDIOVERSION N/A 09/19/2017   Procedure: CARDIOVERSION;  Surgeon: [name], MD;  Location: [place];  Service: Cardiovascular;  Laterality: N/A;  . CARDIOVERSION N/A 10/25/2017   Procedure: CARDIOVERSION;  Surgeon: Acie Fredrickson Wonda Cheng, MD;  Location: [place];  Service: Cardiovascular;  Laterality: N/A;  . CARDIOVERSION N/A 12/27/2018   Procedure: CARDIOVERSION;  Surgeon: [name], MD;  Location: [place];  Service: Cardiovascular;  Laterality: N/A;  . CARDIOVERSION N/A 05/20/2019   Procedure: CARDIOVERSION;  Surgeon: [name], MD;  Location: [facility] ENDOSCOPY;  Service: Cardiovascular;  Laterality: N/A;  . CARDIOVERSION N/A 12/10/2019   Procedure: CARDIOVERSION;  Surgeon: Pixie Casino, MD;  Location[facility]COPY;  Service: Cardiovascular;  Laterality: N/A;  . CARDIOVERSION N/A 12/31/2019   Procedure: CARDIOVERSION;  Surgeon: Sanda Klein, MD;  Location: [facility] ENDOSCOPY;  Service: Cardiovascular;  Laterality: N/A;  . CARPAL TUNNEL RELEASE Right   . CATARACT EXTRACTION W/ INTRAOCULAR LENS  IMPLANT, BILATERAL Bilateral   . COLONOSCOPY    . EXCISIONAL  HEMORRHOIDECTOMY    .  INSERTION OF MESH N/A 05/25/2017   Procedure: INSERTION OF MESH;  Surgeon: Ralene Ok, MD;  Location: [facility];  Service: General;  Laterality: N/A;  . TEE WITHOUT CARDIOVERSION N/A 03/30/2014   Procedure: TRANSESOPHAGEAL ECHOCARDIOGRAM (TEE);  Surgeon: [name], MD;  Location: [facility] ENDOSCOPY;  Service: Cardiovascular;  Laterality: N/A;  . THYROIDECTOMY    . TONSILLECTOMY      Current Outpatient Medications  Medication Sig Dispense Refill  . ACCU-CHEK GUIDE test strip     . acetaminophen (TYLENOL) 650 MG CR tablet Take 650 mg by mouth every 8 (eight) hours.     Marland Kitchen apixaban (ELIQUIS) 5 MG TABS tablet Take 1 tablet (5 mg total) by mouth 2 (two) times daily. 180 tablet 2  . Blood Glucose Monitoring Suppl (ACCU-CHEK GUIDE ME) w/Device KIT     . calcium carbonate (OSCAL) 1500 (600 Ca) MG TABS tablet Take 600 mg of elemental calcium by mouth daily.    . cetirizine (ZYRTEC) 10 MG tablet Take 10 mg by mouth daily.     . Cholecalciferol (DIALYVITE VITAMIN D 5000) 125 MCG (5000 UT) capsule Take 5,000 Units by mouth daily.    . ferrous sulfate 325 (65 FE) MG tablet Take 325 mg by mouth daily with breakfast.    . fluticasone (FLONASE) 50 MCG/ACT nasal spray Place 1 spray into both nostrils at bedtime.     . lansoprazole (PREVACID) 30 MG capsule Take 30 mg by mouth daily.     Marland Kitchen levothyroxine (SYNTHROID) 137 MCG tablet Take 137 mcg by mouth daily.     Marland Kitchen LORazepam (ATIVAN) 1 MG tablet Take 1.5 mg by mouth at bedtime.     . Lutein 6 MG CAPS Take 6 mg by mouth once a week.     . metFORMIN (GLUCOPHAGE-XR) 500 MG 24 hr tablet Take 500 mg by mouth 2 (two) times daily.     . Multiple Vitamins-Minerals (PRESERVISION AREDS 2) CAPS Take 1 capsule by mouth daily.    . Multiple Vitamins-Minerals (ZINC PO) Take 0.5 tablets by mouth 2 (two) times a week.     . Polyvinyl Alcohol-Povidone PF (REFRESH) 1.4-0.6 % SOLN Place 1 drop into both eyes daily as needed (dry eyes).     . potassium  chloride SA (KLOR-CON) 20 MEQ tablet Take 1 tablet (20 mEq total) by mouth daily. 90 tablet 3  . rosuvastatin (CRESTOR) 10 MG tablet Take 10 mg by mouth at bedtime.    . sertraline (ZOLOFT) 50 MG tablet Take 50 mg by mouth daily.    . vitamin B-12 (CYANOCOBALAMIN) 1000 MCG tablet Take 1,000 mcg by mouth as needed (energy).     . metoprolol tartrate (LOPRESSOR) 50 MG tablet Take 1 tablet (50 mg total) by mouth 2 (two) times daily.     No current facility-administered medications for this encounter.    Allergies  Allergen Reactions  . Penicillins Itching, Rash and Other (See Comments)    Did it involve swelling of the face/tongue/throat, SOB, or low BP? No Did it involve sudden or severe rash/hives, skin peeling, or any reaction on the inside of your mouth or nose? No Did you need to seek medical attention at a hospital or doctor's office? No When did it last happen?30+ years If all above answers are "NO", may proceed with cephalosporin use.     Social History   Socioeconomic History  . Marital status: Divorced    Spouse name: Not on file  . Number of children: 2  .  Years of education: Not on file  . Highest education level: Not on file  Occupational History  . Occupation: Retired    Comment: Education officer, museum  Tobacco Use  . Smoking status: Never Smoker  . Smokeless tobacco: Never Used  Vaping Use  . Vaping Use: Never used  Substance and Sexual Activity  . Alcohol use: No    Alcohol/week: 0.0 standard drinks  . Drug use: No  . Sexual activity: Never  Other Topics Concern  . Not on file  Social History Narrative  . Not on file   Social Determinants of Health   Financial Resource Strain:   . Difficulty of Paying Living Expenses: Not on file  Food Insecurity:   . Worried About Charity fundraiser in the Last Year: Not on file  . Ran Out of Food in the Last Year: Not on file  Transportation Needs:   . Lack of Transportation (Medical): Not on file  . Lack of  Transportation (Non-Medical): Not on file  Physical Activity:   . Days of Exercise per Week: Not on file  . Minutes of Exercise per Session: Not on file  Stress:   . Feeling of Stress : Not on file  Social Connections:   . Frequency of Communication with Friends and Family: Not on file  . Frequency of Social Gatherings with Friends and Family: Not on file  . Attends Religious Services: Not on file  . Active Member of Clubs or Organizations: Not on file  . Attends Archivist Meetings: Not on file  . Marital Status: Not on file  Intimate Partner Violence:   . Fear of Current or Ex-Partner: Not on file  . Emotionally Abused: Not on file  . Physically Abused: Not on file  . Sexually Abused: Not on file    Family History  Problem Relation Age of Onset  . Prostate cancer Father   . Diabetes Sister   . Heart disease Sister   . Heart attack Brother   . Heart disease Brother   . Diabetes Brother   . Diabetes Sister   . Diabetes Sister   . Pancreatic cancer Sister   . Diabetes Brother   . Heart disease Brother   . Diabetes Brother   . Heart disease Brother   . Diabetes Brother   . Heart disease Brother   . Diabetes Brother   . Heart disease Brother   . Diabetes Brother   . Heart disease Brother   . Diabetes Brother   . Diabetes Child     ROS- All systems are reviewed and negative except as per the HPI above  Physical Exam: Vitals:   08/04/20 1004  BP: (!) 144/82  Pulse: 87  Weight: 79.2 kg  Height: $Remove'5\' 6"'IdxuiwC$  (1.676 m)   Wt Readings from Last 3 Encounters:  08/04/20 79.2 kg  07/07/20 79.4 kg  05/11/20 80.4 kg    Labs: Lab Results  Component Value Date   NA 139 12/03/2019   K 4.4 12/03/2019   CL 106 12/03/2019   CO2 21 (L) 12/03/2019   GLUCOSE 120 (H) 12/03/2019   BUN 18 12/03/2019   CREATININE 1.11 (H) 12/03/2019   CALCIUM 9.1 12/03/2019   MG 2.2 06/07/2017   Lab Results  Component Value Date   INR 1.12 04/20/2016   No results found for: CHOL,  HDL, LDLCALC, TRIG   GEN- The patient is well appearing, alert and oriented x 3 today.   Head- normocephalic, atraumatic Eyes-  Sclera clear, conjunctiva pink Ears- hearing intact Oropharynx- clear Neck- supple, no JVP Lymph- no cervical lymphadenopathy Lungs- Clear to ausculation bilaterally, normal work of breathing Heart- irregular rate and rhythm, no murmurs, rubs or gallops, PMI not laterally displaced GI- soft, NT, ND, + BS Extremities- no clubbing, cyanosis, or edema MS- no significant deformity or atrophy Skin- no rash or lesion Psych- euthymic mood, full affect Neuro- strength and sensation are intact  EKG- a fib at 87 bpm, qrs int 88 ms, qtc 471 ms Epic records reviewed   Assessment and Plan: 1. Persistent afib Multiple cardioversion's in the past 1-2 years Last cardioversion, 2/17, was successful but  with significant  weakness 2/2 bradycardia Sinus brady resolved with reduction of BB and amiodarone reduced  back to 200 mg qd  However returned to  afib  Discussed on previous visit in April,  Natasha feel that amiodarone is no longer effective to maintain SR and Natasha could  not keep her at 200 mg bid as she may get toxic on it. Natasha thought  it was best to stop drug and rate control  We discussed Tikosyn and she really wants to try drug, qtc corrected looks to be around 430 ms today  However, beds are on hold now for covid and Natasha will put her name on the Tikosyn list and let her know when we are starting to re admit for tikosyn Continue metoprolol 50 mg bid, this will have to be reduced with return of SR as she has symptomatic bradycardia in SR   2. HTN Stable   3. CHA2DS2VASc score of 6 Eliquis 5 mg bid   She has been added to the Tikosyn admit  list and the office will contact her when the beds are more readily available  when covid admits decline    Butch Penny C. Tessa Seaberry, [facility] [address] [place], [place]  [address] [phone]        Primary Care Physician: Lavone Orn, MD Cardiologist: [name] Pulmonologist: Dr. Elsworth Soho GI: Dr. Dolphus Jenny Natasha [name] is a 86 y.o. female with a h/o afib that has been on amiodarone since June of 2015. She has had many breakthrough events, last one in February with successful cardioversion. She does feel very weak in afib. She went into afib around 2 weeks ago and a RN came out to her house on Friday and documented the afib.  Dr. Rayann Heman was called and left instructions to increase amio to 200 mg bid and stop losartan. Apparently her BP was low on that day.  She is in now in the afib clinc and she is rate controlled and BP is stable. She reports around 2-3 weeks ago she first had 2 teeth pulled and then a shot of cortisone in her knee. She hasn't felt well since. She usually requires a cardioversion to restore SR.    F/u in afib clinic 7/15 after successful cardioversion. She is in SR today and feels improved. She is back to Amio 200 mg daily.  F/u in afib clinic, 11/25/19, with feelings of not being well and being out of rhythm x one week. She has had an UTI and just finished a round of doxycycline. EKG shows atrial flutter  variable rate at 110 bpm.CHA2DS2-VASc Score = 6, continues eliquis 5 mg bid. 1   F/u in  afib clinic, 12/16/18. She had a successful cardioversion but unfortunately felt like it only lasted one day. She is in afib  in the 90's. Natasha will try to increase BB and keep amio  at 200 mg a little longer to see if she may return to SR.   F/u  in afib clinic, 01/15/20.  F/u cardioversion 2/17. She was kept on her higher dose of BB at time of cardioversion as she did covert out but with PAC's. She saw Dr. Excell Seltzer one week out and was staying in [place]. She  was left on amiodarone 200 mg bid until f/u here. She called the office earlier in the week and c/o of feeling weak. EKG shows  Sinus brady at 48 bpm. Natasha feel her bradycardia is the reason for her weakness  and will titrate meds down today.   F/u 01/22/20. She remains  in SR  in the low 60's but does not feel that much better.  Back on amiodarone 200 mg daily from  BID and lower dose of BB. Marland Kitchen She had some GI distress yesterday and recently had issues with UTI. She  denies urgency or frequency today.  F/u in afib clinic, 4/1, as pt has gone back into afib. Discussed with her Natasha feel that amiodarone is no longer effective and Natasha think it is best to stop drug at this point and rate control. If qt shortens off 3 month amiodarone wash out, currently at 512 ms then tikosyn may be an option down the line. She has multiple cardioversions with significant ERAF.  F/u in afib clinic, 4/14. She is feeling better off amiodarone and she is rate controlled in the 80's but would like to see her a little slower so will increase BB today. We can consider Tikosyn after  3 month amiodarone wash out if qt interval shortens.    She actually is feeling better and had the enegy to do more around her house and some light exercise. She is upset today as she had some diarrhea this am and had an accident in the clinic.Natasha feel this is why her BP is higher today as she usually  does not have high BP. She feels it may the the plate of food her neighbor gave her last night. She is rate controlled at home in the 70's and 80's.   F/u in afib clinic, 6/29. She remains in rate controlled afib and states that she feels very good. She is exercising without any significant shortness of breath and is rate controlled. No excessive fluid retention. Natasha feel that amiodarone may have been contributing to fatigue and she feels better off drug. She remains on eliquis 5 mg bid for a CHA2DS2VASc score of 6.  Today, she denies symptoms of chest pain, shortness of breath, orthopnea, PND, lower extremity edema, dizziness, presyncope, syncope, or neurologic sequela.Perisistent cough. The patient is tolerating medications without difficulties and is otherwise  without complaint today.   Past Medical History:  Diagnosis Date  . Acute bronchitis 11/15/2017  . ADENOCARCINOMA, BREAST 10/13/2010   Qualifier: Diagnosis of  By: Koleen Distance CMA (AAMA), Hulan Saas    . 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 Allergy    SEASONAL  . Anemia    years ago  . Anticoagulated by anticoagulation treatment - Eliquis 03/30/2014   Started May 2015   . Anxiety state 10/13/2010   Qualifier: Diagnosis of  By: Koleen Distance CMA (AAMA), Hulan Saas    . Arthritis    "all my joints" (03/31/2014)  . Barrett's esophagus   . Breast cancer [facility])    s/p right breast lumpectomy  . CARDIAC MURMUR  10/13/2010   Qualifier: Diagnosis of  By: Nils Pyle CMA ([place]), Mearl Latin    . 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 Cervical cancer ([place])   . Chronic cough 01/02/2017   GERD, hiatal hernia Doubt amio  . COLONIC POLYPS, ADENOMATOUS, HX OF 10/13/2010   Qualifier: Diagnosis of  By: Nils Pyle CMA (AAMA), Mearl Latin    . Degenerative disorder of eye 07/25/2012  . Diabetes mellitus without complication ([place])   . Dilated cardiomyopathy ([place]) 08/27/2018  . DIVERTICULOSIS, COLON 10/13/2010   Qualifier: Diagnosis of  By: Nils Pyle CMA (AAMA), Mearl Latin    . DJD (degenerative joint disease) of knee   . Dysrhythmia    Afib  . Echocardiogram 07/2020    Echo 9/21: EF 70-75, moderate LVH, GR 1 DD, RVSP 40.5 (mildly elevated), normal RVSF, moderate LAE, mild RAE, mild MR, trivial AI  . EROSIVE ESOPHAGITIS 10/13/2010   Qualifier: Diagnosis of  By: Nils Pyle CMA ([place]), Mearl Latin    . Error, refractive, myopia 07/09/2012  . Essential hypertension 10/13/2010   Qualifier: Diagnosis of  By: Nils Pyle CMA ([place]), Mearl Latin    . Exotropia, intermittent 07/09/2012  . GERD 10/14/2010   Qualifier: Diagnosis of  By: Sharlett Iles MD Byrd Hesselbach GERD (gastroesophageal reflux disease)   . Glaucoma   . H/O hiatal hernia   . Heart murmur   . Hiatal hernia s/p robotic repair & fundoplication 2/53/6644 01/15/7424   Qualifier: Diagnosis of  By:  Nils Pyle CMA (AAMA), Mearl Latin    . History of laser assisted in situ keratomileusis 07/25/2012  . Hx of transesophageal echocardiography (TEE) for monitoring    TEE (03/2014): No LAA clot, moderate LAE, core triatriatum type structure in RA with no stenosis, normal EF 55-60%, mild MR  . HYPERCHOLESTEROLEMIA 10/13/2010   Qualifier: Diagnosis of  By: Nils Pyle CMA ([place]), Mearl Latin    . Hypertension   . Hypothyroidism, postsurgical   . Iron Deficiency Anemia 10/14/2010   Qualifier: Diagnosis of  By: Sharlett Iles MD Byrd Hesselbach   . Irritable bowel syndrome 10/13/2010   Qualifier: Diagnosis of  By: Nils Pyle CMA (AAMA), Mearl Latin    . Lumbar stenosis with neurogenic claudication 04/20/2016  . Malignant neoplasm of breast ([place]) 02/25/2016  . Migraine    "last one was years ago" (03/31/2014)  . MIGRAINE HEADACHE 10/13/2010   Qualifier: Diagnosis of  By: Nils Pyle CMA ([place]), Mearl Latin    . 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 OSA on CPAP    settings at 10-11   . OSTEOARTHRITIS 10/13/2010   Qualifier: Diagnosis of  By: Nils Pyle CMA ([place]), Mearl Latin    . Persistent atrial fibrillation 03/31/2015   DLCO dropped from 100% in 2015-50% in 01/2017  . Post-surgical hypothyroidism 04/09/2014  . RECTAL FISSURE 10/13/2010   Qualifier: Diagnosis of  By: Nils Pyle CMA ([place]), Mearl Latin    . S/P Nissen fundoplication (without gastrostomy tube) procedure 05/25/2017  . Sleep apnea   . THYROID CANCER 10/13/2010   Qualifier: Diagnosis of  By: Nils Pyle CMA ([place]), Mearl Latin    . Thyroid cancer [facility])    s/p thyroidectomy  . Type II diabetes mellitus ([place])    type II    Past Surgical History:  Procedure Laterality Date  . ABDOMINAL HYSTERECTOMY     "partial"  . BREAST BIOPSY Right   . BREAST LUMPECTOMY Right   . CARDIOVERSION N/A 03/30/2014   Procedure: CARDIOVERSION - BEDSIDE;  Surgeon: [name], MD;  Location: [place];  Service: Cardiovascular;  Laterality: N/A;  .  CARDIOVERSION N/A 03/31/2014   Procedure: CARDIOVERSION   (BEDSIDE) ;  Surgeon: Lelon Perla, MD;  Location: [place];  Service: Cardiovascular;  Laterality: N/A;  . CARDIOVERSION N/A 04/27/2014   Procedure: CARDIOVERSION;  Surgeon: Darlin Coco, MD;  Location: [place];  Service: Cardiovascular;  Laterality: N/A;  . CARDIOVERSION N/A 04/13/2015   Procedure: CARDIOVERSION;  Surgeon: Thayer Headings, MD;  Location: [place];  Service: Cardiovascular;  Laterality: N/A;  . CARDIOVERSION N/A 01/08/2017   Procedure: CARDIOVERSION;  Surgeon: Sanda Klein, MD;  Location: [facility] ENDOSCOPY;  Service: Cardiovascular;  Laterality: N/A;  . CARDIOVERSION N/A 09/19/2017   Procedure: CARDIOVERSION;  Surgeon: [name], MD;  Location: [place];  Service: Cardiovascular;  Laterality: N/A;  . CARDIOVERSION N/A 10/25/2017   Procedure: CARDIOVERSION;  Surgeon: Acie Fredrickson Wonda Cheng, MD;  Location: [place];  Service: Cardiovascular;  Laterality: N/A;  . CARDIOVERSION N/A 12/27/2018   Procedure: CARDIOVERSION;  Surgeon: [name], MD;  Location: [place];  Service: Cardiovascular;  Laterality: N/A;  . CARDIOVERSION N/A 05/20/2019   Procedure: CARDIOVERSION;  Surgeon: [name], MD;  Location: [facility] ENDOSCOPY;  Service: Cardiovascular;  Laterality: N/A;  . CARDIOVERSION N/A 12/10/2019   Procedure: CARDIOVERSION;  Surgeon: Pixie Casino, MD;  Location: [facility] ENDOSCOPY;  Service: Cardiovascular;  Laterality: N/A;  . CARDIOVERSION N/A 12/31/2019   Procedure: CARDIOVERSION;  Surgeon: Sanda Klein, MD;  Location: [facility] ENDOSCOPY;  Service: Cardiovascular;  Laterality: N/A;  . CARPAL TUNNEL RELEASE Right   . CATARACT EXTRACTION W/ INTRAOCULAR LENS  IMPLANT, BILATERAL Bilateral   . COLONOSCOPY    . EXCISIONAL HEMORRHOIDECTOMY    . INSERTION OF MESH N/A 05/25/2017   Procedure: INSERTION OF MESH;  Surgeon: Ralene Ok, MD;  Location: [facility];  Service: General;  Laterality: N/A;  . TEE WITHOUT CARDIOVERSION N/A 03/30/2014   Procedure:  TRANSESOPHAGEAL ECHOCARDIOGRAM (TEE);  Surgeon: [name], MD;  Location: [facility] ENDOSCOPY;  Service: Cardiovascular;  Laterality: N/A;  . THYROIDECTOMY    . TONSILLECTOMY      Current Outpatient Medications  Medication Sig Dispense Refill  . ACCU-CHEK GUIDE test strip     . acetaminophen (TYLENOL) 650 MG CR tablet Take 650 mg by mouth every 8 (eight) hours.     Marland Kitchen apixaban (ELIQUIS) 5 MG TABS tablet Take 1 tablet (5 mg total) by mouth 2 (two) times daily. 180 tablet 2  . Blood Glucose Monitoring Suppl (ACCU-CHEK GUIDE ME) w/Device KIT     . calcium carbonate (OSCAL) 1500 (600 Ca) MG TABS tablet Take 600 mg of elemental calcium by mouth daily.    . cetirizine (ZYRTEC) 10 MG tablet Take 10 mg by mouth daily.     . Cholecalciferol (DIALYVITE VITAMIN D 5000) 125 MCG (5000 UT) capsule Take 5,000 Units by mouth daily.    . ferrous sulfate 325 (65 FE) MG tablet Take 325 mg by mouth daily with breakfast.    . fluticasone (FLONASE) 50 MCG/ACT nasal spray Place 1 spray into both nostrils at bedtime.     . lansoprazole (PREVACID) 30 MG capsule Take 30 mg by mouth daily.     Marland Kitchen levothyroxine (SYNTHROID) 137 MCG tablet Take 137 mcg by mouth daily.     Marland Kitchen LORazepam (ATIVAN) 1 MG tablet Take 1.5 mg by mouth at bedtime.     . Lutein 6 MG CAPS Take 6 mg by mouth once a week.     . metFORMIN (GLUCOPHAGE-XR) 500 MG 24 hr tablet Take 500 mg by mouth  2 (two) times daily.     . Multiple Vitamins-Minerals (PRESERVISION AREDS 2) CAPS Take 1 capsule by mouth daily.    . Multiple Vitamins-Minerals (ZINC PO) Take 0.5 tablets by mouth 2 (two) times a week.     . Polyvinyl Alcohol-Povidone PF (REFRESH) 1.4-0.6 % SOLN Place 1 drop into both eyes daily as needed (dry eyes).     . potassium chloride SA (KLOR-CON) 20 MEQ tablet Take 1 tablet (20 mEq total) by mouth daily. 90 tablet 3  . rosuvastatin (CRESTOR) 10 MG tablet Take 10 mg by mouth at bedtime.    . sertraline (ZOLOFT) 50 MG tablet Take 50 mg by mouth daily.    .  vitamin B-12 (CYANOCOBALAMIN) 1000 MCG tablet Take 1,000 mcg by mouth as needed (energy).     . metoprolol tartrate (LOPRESSOR) 50 MG tablet Take 1 tablet (50 mg total) by mouth 2 (two) times daily.     No current facility-administered medications for this encounter.    Allergies  Allergen Reactions  . Penicillins Itching, Rash and Other (See Comments)    Did it involve swelling of the face/tongue/throat, SOB, or low BP? No Did it involve sudden or severe rash/hives, skin peeling, or any reaction on the inside of your mouth or nose? No Did you need to seek medical attention at a hospital or doctor's office? No When did it last happen?30+ years If all above answers are "NO", may proceed with cephalosporin use.     Social History   Socioeconomic History  . Marital status: Divorced    Spouse name: Not on file  . Number of children: 2  . Years of education: Not on file  . Highest education level: Not on file  Occupational History  . Occupation: Retired    Comment: Education officer, museum  Tobacco Use  . Smoking status: Never Smoker  . Smokeless tobacco: Never Used  Vaping Use  . Vaping Use: Never used  Substance and Sexual Activity  . Alcohol use: No    Alcohol/week: 0.0 standard drinks  . Drug use: No  . Sexual activity: Never  Other Topics Concern  . Not on file  Social History Narrative  . Not on file   Social Determinants of Health   Financial Resource Strain:   . Difficulty of Paying Living Expenses: Not on file  Food Insecurity:   . Worried About Charity fundraiser in the Last Year: Not on file  . Ran Out of Food in the Last Year: Not on file  Transportation Needs:   . Lack of Transportation (Medical): Not on file  . Lack of Transportation (Non-Medical): Not on file  Physical Activity:   . Days of Exercise per Week: Not on file  . Minutes of Exercise per Session: Not on file  Stress:   . Feeling of Stress : Not on file  Social Connections:   . Frequency of  Communication with Friends and Family: Not on file  . Frequency of Social Gatherings with Friends and Family: Not on file  . Attends Religious Services: Not on file  . Active Member of Clubs or Organizations: Not on file  . Attends Archivist Meetings: Not on file  . Marital Status: Not on file  Intimate Partner Violence:   . Fear of Current or Ex-Partner: Not on file  . Emotionally Abused: Not on file  . Physically Abused: Not on file  . Sexually Abused: Not on file    Family History  Problem Relation  Age of Onset  . Prostate cancer Father   . Diabetes Sister   . Heart disease Sister   . Heart attack Brother   . Heart disease Brother   . Diabetes Brother   . Diabetes Sister   . Diabetes Sister   . Pancreatic cancer Sister   . Diabetes Brother   . Heart disease Brother   . Diabetes Brother   . Heart disease Brother   . Diabetes Brother   . Heart disease Brother   . Diabetes Brother   . Heart disease Brother   . Diabetes Brother   . Heart disease Brother   . Diabetes Brother   . Diabetes Child     ROS- All systems are reviewed and negative except as per the HPI above  Physical Exam: Vitals:   08/04/20 1004  BP: (!) 144/82  Pulse: 87  Weight: 79.2 kg  Height: $Remove'5\' 6"'mZlwviW$  (1.676 m)   Wt Readings from Last 3 Encounters:  08/04/20 79.2 kg  07/07/20 79.4 kg  05/11/20 80.4 kg    Labs: Lab Results  Component Value Date   NA 139 12/03/2019   K 4.4 12/03/2019   CL 106 12/03/2019   CO2 21 (L) 12/03/2019   GLUCOSE 120 (H) 12/03/2019   BUN 18 12/03/2019   CREATININE 1.11 (H) 12/03/2019   CALCIUM 9.1 12/03/2019   MG 2.2 06/07/2017   Lab Results  Component Value Date   INR 1.12 04/20/2016   No results found for: CHOL, HDL, LDLCALC, TRIG   GEN- The patient is well appearing, alert and oriented x 3 today.   Head- normocephalic, atraumatic Eyes-  Sclera clear, conjunctiva pink Ears- hearing intact Oropharynx- clear Neck- supple, no JVP Lymph- no  cervical lymphadenopathy Lungs- Clear to ausculation bilaterally, normal work of breathing Heart- irregular rate and rhythm, no murmurs, rubs or gallops, PMI not laterally displaced GI- soft, NT, ND, + BS Extremities- no clubbing, cyanosis, or edema MS- no significant deformity or atrophy Skin- no rash or lesion Psych- euthymic mood, full affect Neuro- strength and sensation are intact  EKG- a flutter  at 98 bpm,  qrs int 100 ms, qt int 449   ms  Epic records reviewed   Assessment and Plan: 1. Persistent afib Multiple cardioversion's in the past  Last cardioversion, 2/17, was successful but  with significant  weakness 2/2 bradycardia Sinus brady resolved with reduction of BB and amiodarone redcued  back to 200 mg qd  However returned to  afib  Discussed on previous  visit  with pt Natasha feel that amiodarone is no longer effective to maintain SR and Natasha can not keep her at 200 mg bid as she may get toxic on it. Natasha think it is best to stop drug and rate control  Continue metoprolol to 25 (1 1/2 mg)  bid  She actually is feeling better off amiodarone and being  rate controlled  She may be a Tikosyn candidate after 3 month amio washout if qt shortens and she does not tolerate rate control    2. HTN Stable   3. CHA2DS2VASc score of 6 Eliquis 5 mg bid   F/u in afib clinic in 3 months    Butch Penny C. Julee Stoll, [facility] [address] [place], [place] [address] [phone]

## 2020-08-12 ENCOUNTER — Ambulatory Visit (HOSPITAL_COMMUNITY): Payer: Medicare PPO | Admitting: Nurse Practitioner

## 2020-08-12 IMAGING — MR MR CERVICAL SPINE W/O CM
4 of 5 series · 28 of 48 positions shown · non-contrast
Comparison: Brain MRI today reported separately.

CLINICAL DATA: 87-year-old female with ataxia, spastic gait, falls.
Progressive symptoms.

EXAM:
MRI CERVICAL SPINE WITHOUT CONTRAST
TECHNIQUE: Multiplanar, multisequence MR imaging of the cervical spine was
performed. No intravenous contrast was administered.

[Series 3: tir sag · sagittal · 3.0mm · 0.41mm/px · 6 of 13 slices shown]
[im 1/13]
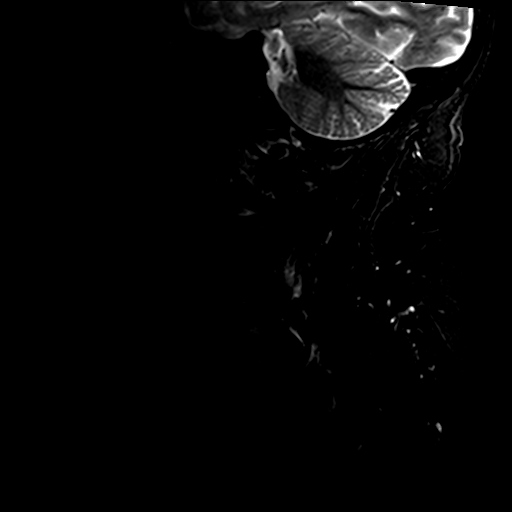
[im 3/13]
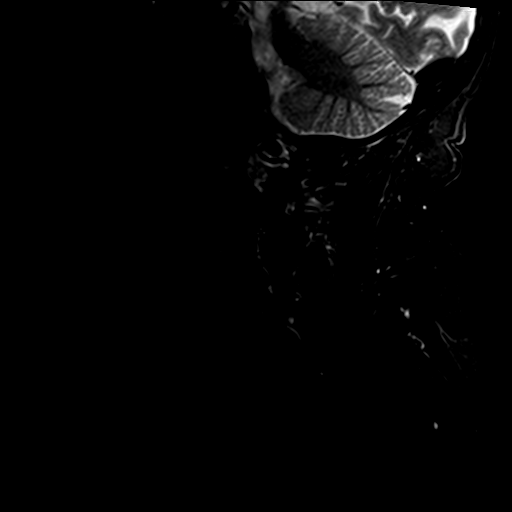
[im 5/13]
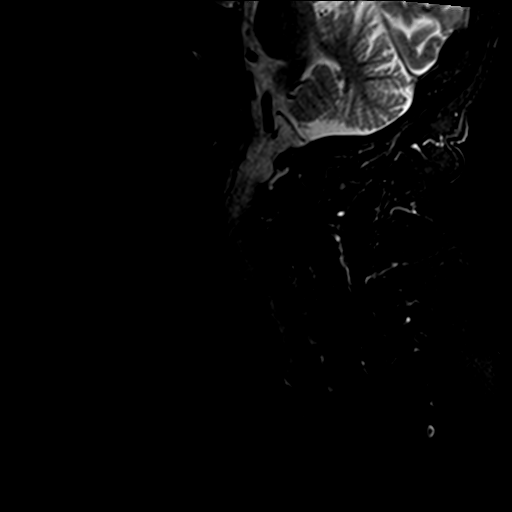
[im 8/13]
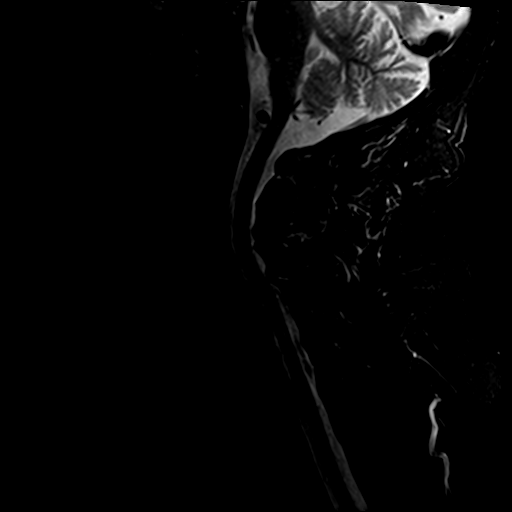
[im 10/13]
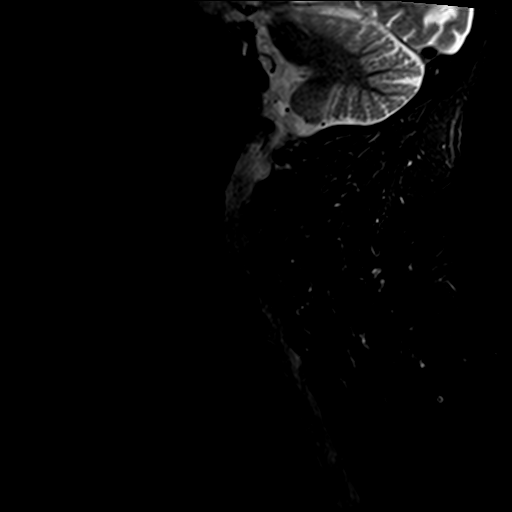
[im 13/13]
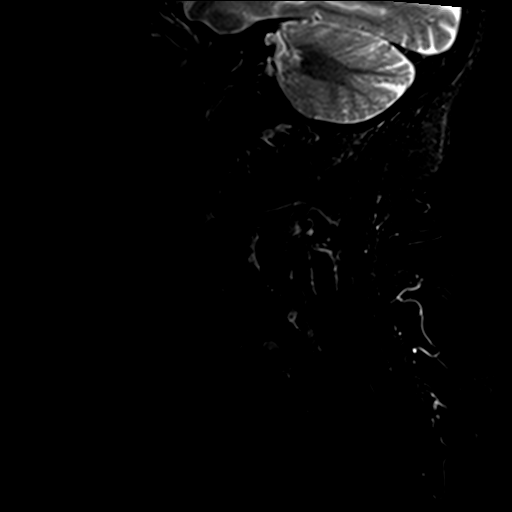

[Series 4: T1 · sagittal · 3.0mm · 0.41mm/px · 7 of 13 slices shown]
[im 1/13]
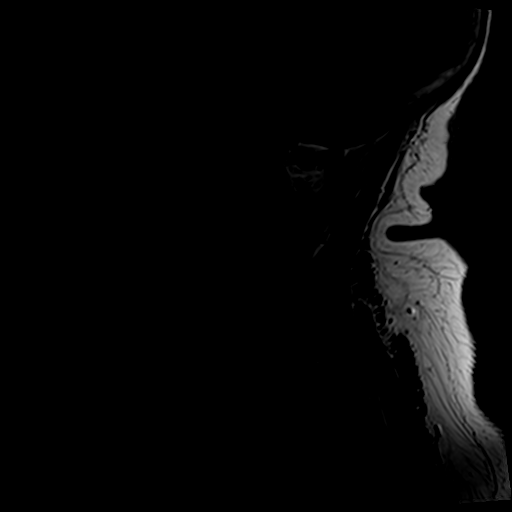
[im 3/13]
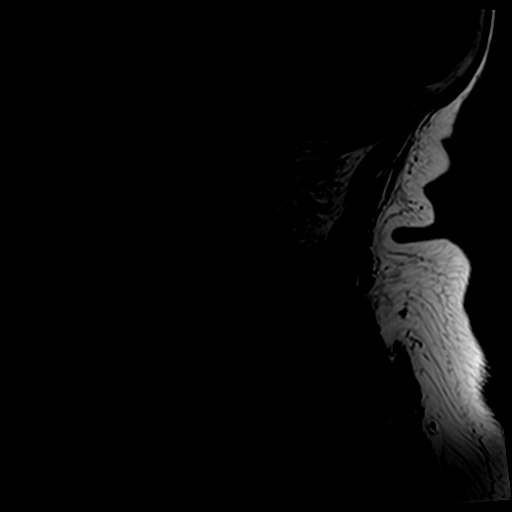
[im 5/13]
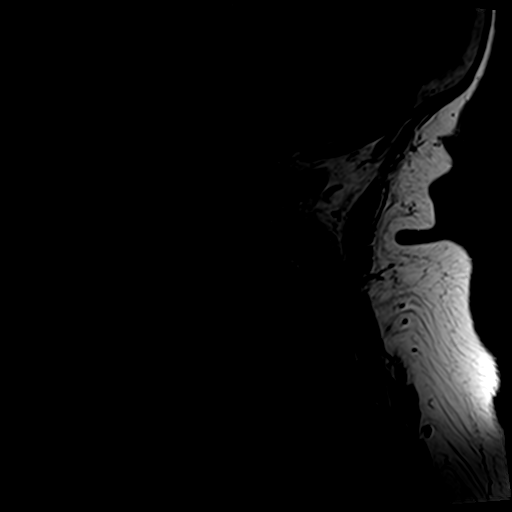
[im 7/13]
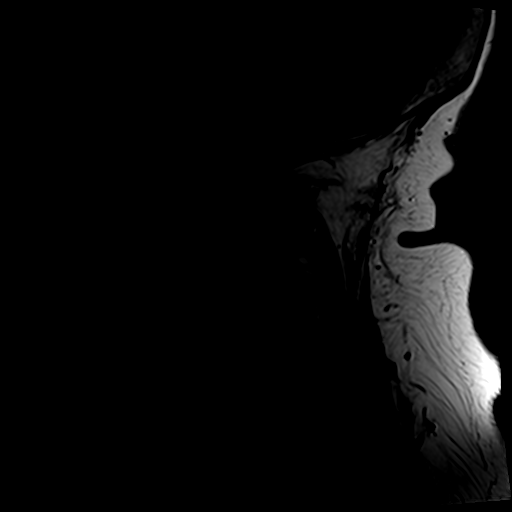
[im 9/13]
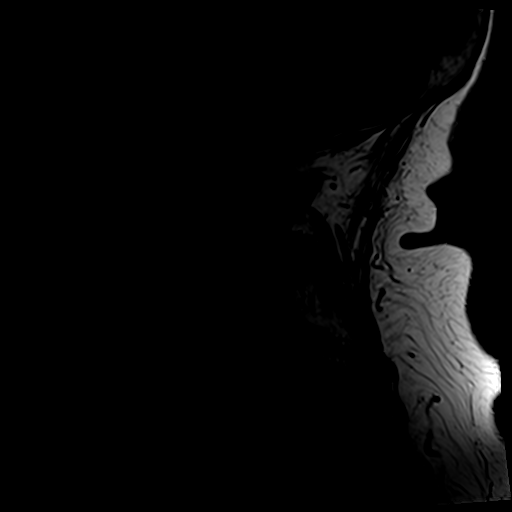
[im 11/13]
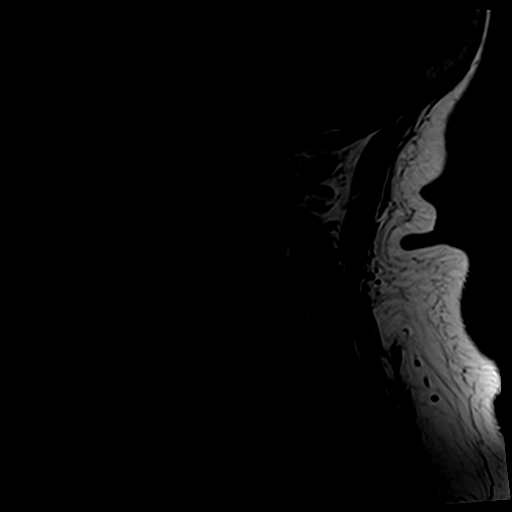
[im 13/13]
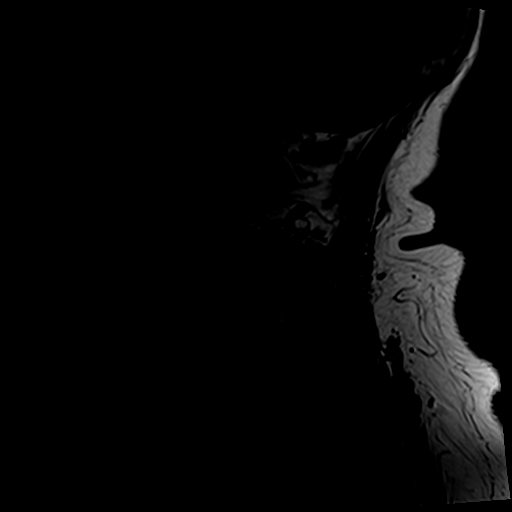

[Series 5: T2 · sagittal · 3.0mm · 0.66mm/px · 7 of 13 slices shown (1 of 2)]
[im 1/13]
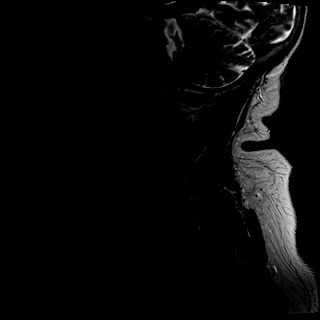
[im 3/13]
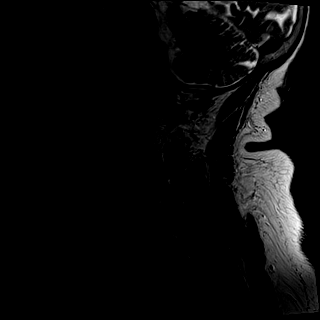
[im 5/13]
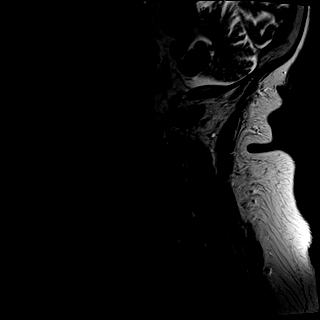
[im 7/13]
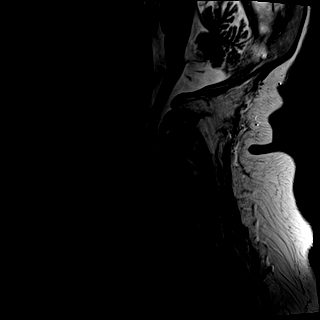
[im 9/13]
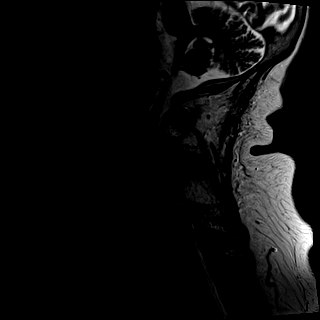
[im 11/13]
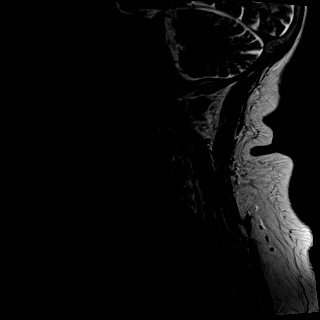
[im 13/13]
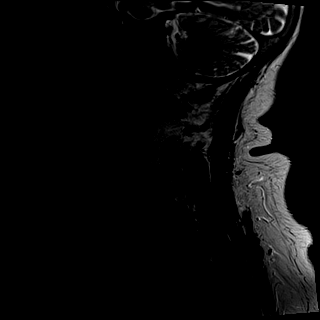

[Series 7: T2 · axial · 3.0mm · 0.70mm/px · z∈[-72,+25]mm · 8 of 28 slices shown (2 of 2)]
[im 1/28]
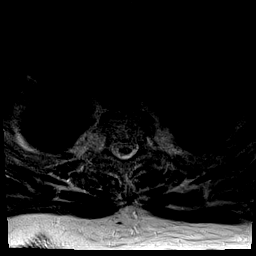
[im 5/28]
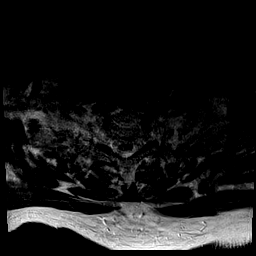
[im 9/28]
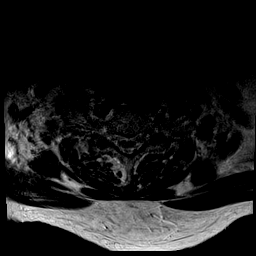
[im 13/28]
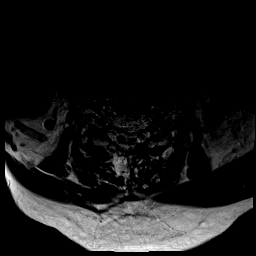
[im 15/28]
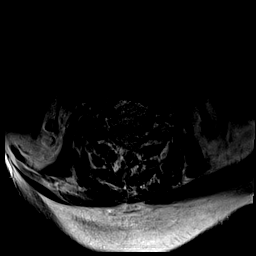
[im 19/28]
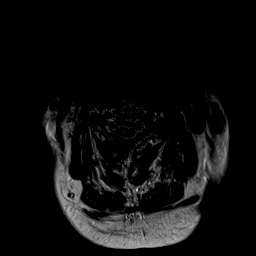
[im 23/28]
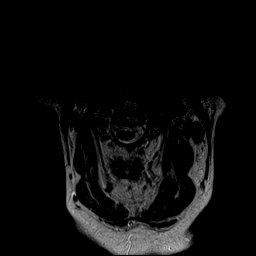
[im 28/28]
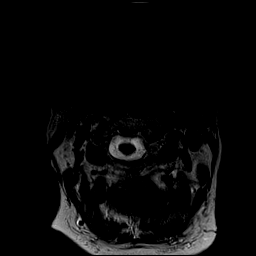

[28 of 48 positions shown; findings below may reference images not displayed]

FINDINGS: Alignment: Preserved lordosis in the upper cervical spine, but mild
reversal of lordosis in the cervical spine and subtle degenerative
appearing anterolisthesis of C5 on C6.

Vertebrae: Normal overall bone marrow signal. Mild degenerative
endplate chronic marrow signal changes C5-C6 through C7-T1. No
marrow edema or evidence of acute osseous abnormality.

Cord: Spinal cord signal is within normal limits at all visualized
levels.

Posterior Fossa, vertebral arteries, paraspinal tissues: Preserved
major vascular flow voids in the neck. Cervicomedullary junction is
within normal limits. Negative visible posterior fossa. Negative
neck soft tissues and visible lung apices.

Disc levels:

C2-C3:  Mild ligament flavum hypertrophy.  Otherwise negative.

C3-C4: Leftward disc bulge and endplate spurring. Mild to moderate
facet and ligament flavum hypertrophy greater on the left. No spinal
stenosis. Moderate to severe left C4 foraminal stenosis.

C4-C5: Mild facet and ligament flavum hypertrophy. Tortuous left
vertebral artery in the left neural foramen. No stenosis.

C5-C6: Subtle anterolisthesis. Disc space loss with circumferential
disc bulge and endplate spurring. Mild facet and ligament flavum
hypertrophy. Mild spinal stenosis. Mild if any cord mass effect.
Moderate bilateral C6 foraminal stenosis.

C6-C7: Disc space loss with circumferential disc bulge and endplate
spurring. No stenosis.

C7-T1: Disc space loss with circumferential disc bulge and endplate
spurring. Mild to moderate facet and ligament flavum hypertrophy. No
significant stenosis.

Disc bulging and posterior element hypertrophy at T1-T2 do not
result in spinal stenosis but do mildly to moderately affect the
bilateral T1 neural foramina. Other visible upper thoracic levels
are negative.
IMPRESSION: 1. No acute osseous abnormality and mild for age cervical spine
degeneration.
2. Isolate mild multifactorial spinal stenosis at C5-C6. Mild if any
spinal cord mass effect and no cord signal abnormality identified.
3. Degenerative moderate bilateral C6 and moderate to severe left C4
neural foraminal stenosis.

## 2020-08-18 ENCOUNTER — Ambulatory Visit
Admission: RE | Admit: 2020-08-18 | Discharge: 2020-08-18 | Disposition: A | Discharge status: Home / Self Care | Payer: Medicare PPO | Source: Ambulatory Visit | Attending: Internal Medicine | Admitting: Internal Medicine

## 2020-08-18 ENCOUNTER — Other Ambulatory Visit: Payer: Self-pay | Admitting: Internal Medicine

## 2020-08-18 DIAGNOSIS — E039 Hypothyroidism, unspecified: Secondary | ICD-10-CM | POA: Diagnosis not present

## 2020-08-18 DIAGNOSIS — R059 Cough, unspecified: Secondary | ICD-10-CM

## 2020-08-18 DIAGNOSIS — R531 Weakness: Secondary | ICD-10-CM | POA: Diagnosis not present

## 2020-08-18 DIAGNOSIS — R634 Abnormal weight loss: Secondary | ICD-10-CM | POA: Diagnosis not present

## 2020-08-18 DIAGNOSIS — R8290 Unspecified abnormal findings in urine: Secondary | ICD-10-CM | POA: Diagnosis not present

## 2020-08-25 ENCOUNTER — Other Ambulatory Visit (HOSPITAL_COMMUNITY): Payer: Self-pay | Admitting: Nurse Practitioner

## 2020-09-03 ENCOUNTER — Telehealth: Payer: Self-pay | Admitting: Pharmacist

## 2020-09-03 NOTE — Telephone Encounter (Signed)
Medication list reviewed in anticipation of upcoming Tikosyn initiation. Patient is taking 2 QTc prolonging medications.   Concurrent use of DOFETILIDE and METFORMIN may result in an increased risk of cardiotoxicity (QT prolongation, torsades de pointes, cardiac arrest).  Concurrent use of SERTRALINE and DOFETILIDE may result in increased risk of QT-interval prolongation.  Patient is anticoagulated on Eliquis on the appropriate dose. Please ensure that patient has not missed any anticoagulation doses in the 3 weeks prior to Tikosyn initiation.   Patient will need to be counseled to avoid use of Benadryl while on Tikosyn and in the 2-3 days prior to Tikosyn initiation.

## 2020-09-07 DIAGNOSIS — E7800 Pure hypercholesterolemia, unspecified: Secondary | ICD-10-CM | POA: Diagnosis not present

## 2020-09-07 DIAGNOSIS — Z0000 Encounter for general adult medical examination without abnormal findings: Secondary | ICD-10-CM | POA: Diagnosis not present

## 2020-09-07 DIAGNOSIS — G4733 Obstructive sleep apnea (adult) (pediatric): Secondary | ICD-10-CM | POA: Diagnosis not present

## 2020-09-07 DIAGNOSIS — I700 Atherosclerosis of aorta: Secondary | ICD-10-CM | POA: Diagnosis not present

## 2020-09-07 DIAGNOSIS — E538 Deficiency of other specified B group vitamins: Secondary | ICD-10-CM | POA: Diagnosis not present

## 2020-09-07 DIAGNOSIS — I482 Chronic atrial fibrillation, unspecified: Secondary | ICD-10-CM | POA: Diagnosis not present

## 2020-09-07 DIAGNOSIS — E1142 Type 2 diabetes mellitus with diabetic polyneuropathy: Secondary | ICD-10-CM | POA: Diagnosis not present

## 2020-09-07 DIAGNOSIS — Z1389 Encounter for screening for other disorder: Secondary | ICD-10-CM | POA: Diagnosis not present

## 2020-09-07 DIAGNOSIS — E039 Hypothyroidism, unspecified: Secondary | ICD-10-CM | POA: Diagnosis not present

## 2020-09-07 DIAGNOSIS — R350 Frequency of micturition: Secondary | ICD-10-CM | POA: Diagnosis not present

## 2020-09-07 DIAGNOSIS — I10 Essential (primary) hypertension: Secondary | ICD-10-CM | POA: Diagnosis not present

## 2020-09-22 ENCOUNTER — Other Ambulatory Visit (HOSPITAL_COMMUNITY): Payer: Self-pay | Admitting: Nurse Practitioner

## 2020-09-28 DIAGNOSIS — E7800 Pure hypercholesterolemia, unspecified: Secondary | ICD-10-CM | POA: Diagnosis not present

## 2020-09-28 DIAGNOSIS — E039 Hypothyroidism, unspecified: Secondary | ICD-10-CM | POA: Diagnosis not present

## 2020-09-28 DIAGNOSIS — E1142 Type 2 diabetes mellitus with diabetic polyneuropathy: Secondary | ICD-10-CM | POA: Diagnosis not present

## 2020-09-28 DIAGNOSIS — E538 Deficiency of other specified B group vitamins: Secondary | ICD-10-CM | POA: Diagnosis not present

## 2020-10-15 ENCOUNTER — Other Ambulatory Visit (HOSPITAL_COMMUNITY)
Admission: RE | Admit: 2020-10-15 | Discharge: 2020-10-15 | Disposition: A | Discharge status: Home / Self Care | Payer: Medicare PPO | Source: Ambulatory Visit | Attending: Nurse Practitioner | Admitting: Nurse Practitioner

## 2020-10-15 DIAGNOSIS — Z01812 Encounter for preprocedural laboratory examination: Secondary | ICD-10-CM | POA: Diagnosis not present

## 2020-10-15 DIAGNOSIS — Z20822 Contact with and (suspected) exposure to covid-19: Secondary | ICD-10-CM | POA: Insufficient documentation

## 2020-10-16 LAB — SARS CORONAVIRUS 2 (TAT 6-24 HRS): SARS Coronavirus 2: NEGATIVE

## 2020-10-18 NOTE — Progress Notes (Signed)
Primary Care Physician: Lavone Orn, MD Cardiologist: [name] Pulmonologist: Dr. Elsworth Soho GI: Dr. Dolphus Jenny [name] is a 86 y.o. female with a h/o afib that has been on amiodarone since June of 2015. She has had many breakthrough events, last one in February with successful cardioversion. She does feel very weak in afib. She went into afib around 2 weeks ago and a RN came out to her house on Friday and documented the afib.  Dr. Rayann Heman was called and left instructions to increase amio to 200 mg bid and stop losartan. Apparently her BP was low on that day.  She is in now in the afib clinc and she is rate controlled and BP is stable. She reports around 2-3 weeks ago she first had 2 teeth pulled and then a shot of cortisone in her knee. She hasn't felt well since. She usually requires a cardioversion to restore SR.    F/u in afib clinic 7/15 after successful cardioversion. She is in SR today and feels improved. She is back to Amio 200 mg daily.  F/u in afib clinic, 11/25/19, with feelings of not being well and being out of rhythm x one week. She has had an UTI and just finished a round of doxycycline. EKG shows atrial flutter  variable rate at 110 bpm.CHA2DS2-VASc Score = 6, continues eliquis 5 mg bid. 1   F/u in  afib clinic, 12/16/18. She had a successful cardioversion but unfortunately felt like it only lasted one day. She is in afib in the 90's. Natasha will try to increase BB and keep amio  at 200 mg a little longer to see if she may return to [place].   F/u  in afib clinic, 01/15/20.  F/u cardioversion 2/17. She was kept on her higher dose of BB at time of cardioversion as she did covert out but with PAC's. She saw [name] one week out and was staying in [place]. She  was left on amiodarone 200 mg bid until f/u here. She called the office earlier in the week and c/o of feeling weak. EKG shows  Sinus brady at 48 bpm. Natasha feel her bradycardia is the reason for her weakness and will titrate meds down today.    F/u 01/22/20. She remains  in SR  in the low 60's but does not feel that much better.  Back on amiodarone 200 mg daily from  BID and lower dose of BB. Marland Kitchen She had some GI distress yesterday and recently had issues with UTI. She  denies urgency or frequency today.  F/u in afib clinic, 4/1, as pt has gone back into afib. Discussed with her Natasha feel that amiodarone is no longer effective and Natasha think it is best to stop drug at this point and rate control. If qt shortens off 3 month amiodarone wash out, currently at 512 ms then tikosyn may be an option down the line. She has multiple cardioversions with significant ERAF.  F/u in afib clinic, 4/14. She is feeling better off amiodarone and she is rate controlled in the 80's but would like to see her a little slower so will increase BB today. We can consider Tikosyn after  3 month amiodarone wash out if qt interval shortens.    She actually is feeling better and had the enegy to do more around her house and some light exercise. She is upset today as she had some diarrhea this am and had an accident in the clinic.Natasha feel this  is why her BP is higher today as she usually  does not have high BP. She feels it may the the plate of food her neighbor gave her last night. She is rate controlled at home in the 70's and 80's.   F/u in afib clinic, 6/29. She remains in rate controlled afib and states that she feels very good. She is exercising without any significant shortness of breath and is rate controlled. No excessive fluid retention. Natasha feel that amiodarone may have been contributing to fatigue and she feels better off drug. She remains on eliquis 5 mg bid for a CHA2DS2VASc score of 6.  F/u in afib clinic, 08/04/20. She  remains in rate controlled afib and initially tells me that she is exercising and feels great, but then tells me she  feels bad and would love to get back in rhythm. QT corrected around 430 ms. She has now been off amiodarone x 5 months.   F/u in afib  clinic, 10/19/20,  as pt wants to be admitted for Tikosyn initiation. She  states that she feels terrible in afib. She has been living afib for several months . Natasha cut back her BB yesterday as she is slow in SR when she returns to SR and she is  not quite as well rate controlled today. She has been on 50 mg metoprolol bid with nice rate control.   She failed amiodarone after taking for years  and it was stopped late Spring after multiple cardioversion that would only hold short term.  She  has been off drug for sufficient period of time to try Tikosyn Her qtc today is 480 ms, but in SR with amiodarone on board was 460 ms, EKg  in spring 2015 without amio was 439 ms.  No benadryl use.  No missed anticoagulation. Electrolytes ok for admission with crcl cal at 64 so she will qualify for 500 msc. Covid negative. + vaccines  Today, she denies symptoms of chest pain, shortness of breath, orthopnea, PND, lower extremity edema, dizziness, presyncope, syncope, or neurologic sequela.Perisistent cough. The patient is tolerating medications without difficulties and is otherwise without complaint today.   Past Medical History:  Diagnosis Date  . Acute bronchitis 11/15/2017  . ADENOCARCINOMA, BREAST 10/13/2010   Qualifier: Diagnosis of  By: Nils Pyle CMA (AAMA), Mearl Latin    . 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 Allergy    SEASONAL  . Anemia    years ago  . Anticoagulated by anticoagulation treatment - Eliquis 03/30/2014   Started May 2015   . Anxiety state 10/13/2010   Qualifier: Diagnosis of  By: Nils Pyle CMA ([place]), Mearl Latin    . Arthritis    "all my joints" (03/31/2014)  . Barrett's esophagus   . Breast cancer [facility])    s/p right breast lumpectomy  . CARDIAC MURMUR 10/13/2010   Qualifier: Diagnosis of  By: Nils Pyle CMA ([place]), Mearl Latin    . 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 Cervical cancer ([place])   . Chronic cough 01/02/2017   GERD, hiatal hernia Doubt amio  . COLONIC POLYPS, ADENOMATOUS, HX OF  10/13/2010   Qualifier: Diagnosis of  By: Nils Pyle CMA (AAMA), Mearl Latin    . Degenerative disorder of eye 07/25/2012  . Diabetes mellitus without complication ([place])   . Dilated cardiomyopathy ([place]) 08/27/2018  . DIVERTICULOSIS, COLON 10/13/2010   Qualifier: Diagnosis of  By: Nils Pyle CMA (AAMA), Mearl Latin    . DJD (degenerative joint disease) of knee   . Dysrhythmia    Afib  .  Echocardiogram 07/2020    Echo 9/21: EF 70-75, moderate LVH, GR 1 DD, RVSP 40.5 (mildly elevated), normal RVSF, moderate LAE, mild RAE, mild MR, trivial AI  . EROSIVE ESOPHAGITIS 10/13/2010   Qualifier: Diagnosis of  By: Nils Pyle CMA ([place]), Mearl Latin    . Error, refractive, myopia 07/09/2012  . Essential hypertension 10/13/2010   Qualifier: Diagnosis of  By: Nils Pyle CMA ([place]), Mearl Latin    . Exotropia, intermittent 07/09/2012  . GERD 10/14/2010   Qualifier: Diagnosis of  By: Sharlett Iles MD Byrd Hesselbach GERD (gastroesophageal reflux disease)   . Glaucoma   . H/O hiatal hernia   . Heart murmur   . Hiatal hernia s/p robotic repair & fundoplication 6/43/3295 18/06/4165   Qualifier: Diagnosis of  By: Nils Pyle CMA (AAMA), Mearl Latin    . History of laser assisted in situ keratomileusis 07/25/2012  . Hx of transesophageal echocardiography (TEE) for monitoring    TEE (03/2014): No LAA clot, moderate LAE, core triatriatum type structure in RA with no stenosis, normal EF 55-60%, mild MR  . HYPERCHOLESTEROLEMIA 10/13/2010   Qualifier: Diagnosis of  By: Nils Pyle CMA ([place]), Mearl Latin    . Hypertension   . Hypothyroidism, postsurgical   . Iron Deficiency Anemia 10/14/2010   Qualifier: Diagnosis of  By: Sharlett Iles MD Byrd Hesselbach   . Irritable bowel syndrome 10/13/2010   Qualifier: Diagnosis of  By: Nils Pyle CMA (AAMA), Mearl Latin    . Lumbar stenosis with neurogenic claudication 04/20/2016  . Malignant neoplasm of breast ([place]) 02/25/2016  . Migraine    "last one was years ago" (03/31/2014)  . MIGRAINE HEADACHE 10/13/2010   Qualifier: Diagnosis of  By: Nils Pyle CMA  ([place]), Mearl Latin    . 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 OSA on CPAP    settings at 10-11   . OSTEOARTHRITIS 10/13/2010   Qualifier: Diagnosis of  By: Nils Pyle CMA ([place]), Mearl Latin    . Persistent atrial fibrillation 03/31/2015   DLCO dropped from 100% in 2015-50% in 01/2017  . Post-surgical hypothyroidism 04/09/2014  . RECTAL FISSURE 10/13/2010   Qualifier: Diagnosis of  By: Nils Pyle CMA ([place]), Mearl Latin    . S/P Nissen fundoplication (without gastrostomy tube) procedure 05/25/2017  . Sleep apnea   . THYROID CANCER 10/13/2010   Qualifier: Diagnosis of  By: Nils Pyle CMA ([place]), Mearl Latin    . Thyroid cancer [facility])    s/p thyroidectomy  . Type II diabetes mellitus ([place])    type II    Past Surgical History:  Procedure Laterality Date  . ABDOMINAL HYSTERECTOMY     "partial"  . BREAST BIOPSY Right   . BREAST LUMPECTOMY Right   . CARDIOVERSION N/A 03/30/2014   Procedure: CARDIOVERSION - BEDSIDE;  Surgeon: [name], MD;  Location: [place];  Service: Cardiovascular;  Laterality: N/A;  . CARDIOVERSION N/A 03/31/2014   Procedure: CARDIOVERSION  (BEDSIDE) ;  Surgeon: Lelon Perla, MD;  Location: [place];  Service: Cardiovascular;  Laterality: N/A;  . CARDIOVERSION N/A 04/27/2014   Procedure: CARDIOVERSION;  Surgeon: Darlin Coco, MD;  Location[facility] ENDOSCOPY;  Service: Cardiovascular;  Laterality: N/A;  . CARDIOVERSION N/A 04/13/2015   Procedure: CARDIOVERSION;  Surgeon: Thayer Headings, MD;  Location: [place];  Service: Cardiovascular;  Laterality: N/A;  . CARDIOVERSION N/A 01/08/2017   Procedure: CARDIOVERSION;  Surgeon: Sanda Klein, MD;  Location: [facility] ENDOSCOPY;  Service: Cardiovascular;  Laterality: N/A;  . CARDIOVERSION N/A 09/19/2017   Procedure: CARDIOVERSION;  Surgeon: [name], MD;  Location: [place];  Service: Cardiovascular;  Laterality: N/A;  . CARDIOVERSION N/A 10/25/2017   Procedure: CARDIOVERSION;  Surgeon: Thayer Headings, MD;  Location: [facility] ENDOSCOPY;   Service: Cardiovascular;  Laterality: N/A;  . CARDIOVERSION N/A 12/27/2018   Procedure: CARDIOVERSION;  Surgeon: [name], MD;  Location: [facility] ENDOSCOPY;  Service: Cardiovascular;  Laterality: N/A;  . CARDIOVERSION N/A 05/20/2019   Procedure: CARDIOVERSION;  Surgeon: [name], MD;  Location: [facility] ENDOSCOPY;  Service: Cardiovascular;  Laterality: N/A;  . CARDIOVERSION N/A 12/10/2019   Procedure: CARDIOVERSION;  Surgeon: Pixie Casino, MD;  Location: [facility] ENDOSCOPY;  Service: Cardiovascular;  Laterality: N/A;  . CARDIOVERSION N/A 12/31/2019   Procedure: CARDIOVERSION;  Surgeon: Sanda Klein, MD;  Location: [facility] ENDOSCOPY;  Service: Cardiovascular;  Laterality: N/A;  . CARPAL TUNNEL RELEASE Right   . CATARACT EXTRACTION W/ INTRAOCULAR LENS  IMPLANT, BILATERAL Bilateral   . COLONOSCOPY    . EXCISIONAL HEMORRHOIDECTOMY    . INSERTION OF MESH N/A 05/25/2017   Procedure: INSERTION OF MESH;  Surgeon: Ralene Ok, MD;  Location: [facility];  Service: General;  Laterality: N/A;  . TEE WITHOUT CARDIOVERSION N/A 03/30/2014   Procedure: TRANSESOPHAGEAL ECHOCARDIOGRAM (TEE);  Surgeon: [name], MD;  Location: [facility] ENDOSCOPY;  Service: Cardiovascular;  Laterality: N/A;  . THYROIDECTOMY    . TONSILLECTOMY      Current Outpatient Medications  Medication Sig Dispense Refill  . ACCU-CHEK GUIDE test strip     . acetaminophen (TYLENOL) 650 MG CR tablet Take 650 mg by mouth every 8 (eight) hours.     . Blood Glucose Monitoring Suppl (ACCU-CHEK GUIDE ME) w/Device KIT     . calcium carbonate (OSCAL) 1500 (600 Ca) MG TABS tablet Take 600 mg of elemental calcium by mouth daily.    . cetirizine (ZYRTEC) 10 MG tablet Take 10 mg by mouth daily.     . Cholecalciferol (DIALYVITE VITAMIN D 5000) 125 MCG (5000 UT) capsule Take 5,000 Units by mouth daily.    Marland Kitchen ELIQUIS 5 MG TABS tablet TAKE 1 TABLET TWICE DAILY 180 tablet 1  . ferrous sulfate 325 (65 FE) MG tablet Take 325 mg by mouth daily with  breakfast.    . fluticasone (FLONASE) 50 MCG/ACT nasal spray Place 1 spray into both nostrils at bedtime.     . lansoprazole (PREVACID) 30 MG capsule Take 30 mg by mouth daily.     Marland Kitchen levothyroxine (SYNTHROID) 137 MCG tablet Take 137 mcg by mouth daily.     Marland Kitchen LORazepam (ATIVAN) 1 MG tablet Take 1.5 mg by mouth at bedtime.     . Lutein 6 MG CAPS Take 6 mg by mouth once a week.     . metFORMIN (GLUCOPHAGE-XR) 500 MG 24 hr tablet Take 500 mg by mouth 2 (two) times daily.     . metoprolol tartrate (LOPRESSOR) 50 MG tablet Take 1 tablet (50 mg total) by mouth 2 (two) times daily.    . metoprolol tartrate (LOPRESSOR) 50 MG tablet Take 1 tablet (50 mg total) by mouth 2 (two) times daily. 180 tablet 1  . Multiple Vitamins-Minerals (PRESERVISION AREDS 2) CAPS Take 1 capsule by mouth daily.    . Multiple Vitamins-Minerals (ZINC PO) Take 0.5 tablets by mouth 2 (two) times a week.     . Polyvinyl Alcohol-Povidone PF (REFRESH) 1.4-0.6 % SOLN Place 1 drop into both eyes daily as needed (dry eyes).     . potassium chloride SA (KLOR-CON) 20 MEQ tablet Take 1 tablet (20  mEq total) by mouth daily. 90 tablet 3  . rosuvastatin (CRESTOR) 10 MG tablet Take 10 mg by mouth at bedtime.    . sertraline (ZOLOFT) 50 MG tablet Take 50 mg by mouth daily.    . vitamin B-12 (CYANOCOBALAMIN) 1000 MCG tablet Take 1,000 mcg by mouth as needed (energy).      No current facility-administered medications for this encounter.    Allergies  Allergen Reactions  . Penicillins Itching, Rash and Other (See Comments)    Did it involve swelling of the face/tongue/throat, SOB, or low BP? No Did it involve sudden or severe rash/hives, skin peeling, or any reaction on the inside of your mouth or nose? No Did you need to seek medical attention at a hospital or doctor's office? No When did it last happen?30+ years If all above answers are "NO", may proceed with cephalosporin use.     Social History   Socioeconomic History  .  Marital status: Divorced    Spouse name: Not on file  . Number of children: 2  . Years of education: Not on file  . Highest education level: Not on file  Occupational History  . Occupation: Retired    Comment: Education officer, museum  Tobacco Use  . Smoking status: Never Smoker  . Smokeless tobacco: Never Used  Vaping Use  . Vaping Use: Never used  Substance and Sexual Activity  . Alcohol use: No    Alcohol/week: 0.0 standard drinks  . Drug use: No  . Sexual activity: Never  Other Topics Concern  . Not on file  Social History Narrative  . Not on file   Social Determinants of Health   Financial Resource Strain:   . Difficulty of Paying Living Expenses: Not on file  Food Insecurity:   . Worried About Charity fundraiser in the Last Year: Not on file  . Ran Out of Food in the Last Year: Not on file  Transportation Needs:   . Lack of Transportation (Medical): Not on file  . Lack of Transportation (Non-Medical): Not on file  Physical Activity:   . Days of Exercise per Week: Not on file  . Minutes of Exercise per Session: Not on file  Stress:   . Feeling of Stress : Not on file  Social Connections:   . Frequency of Communication with Friends and Family: Not on file  . Frequency of Social Gatherings with Friends and Family: Not on file  . Attends Religious Services: Not on file  . Active Member of Clubs or Organizations: Not on file  . Attends Archivist Meetings: Not on file  . Marital Status: Not on file  Intimate Partner Violence:   . Fear of Current or Ex-Partner: Not on file  . Emotionally Abused: Not on file  . Physically Abused: Not on file  . Sexually Abused: Not on file    Family History  Problem Relation Age of Onset  . Prostate cancer Father   . Diabetes Sister   . Heart disease Sister   . Heart attack Brother   . Heart disease Brother   . Diabetes Brother   . Diabetes Sister   . Diabetes Sister   . Pancreatic cancer Sister   . Diabetes Brother   .  Heart disease Brother   . Diabetes Brother   . Heart disease Brother   . Diabetes Brother   . Heart disease Brother   . Diabetes Brother   . Heart disease Brother   . Diabetes  Brother   . Heart disease Brother   . Diabetes Brother   . Diabetes Child     ROS- All systems are reviewed and negative except as per the HPI above  Physical Exam: There were no vitals filed for this visit. Wt Readings from Last 3 Encounters:  08/04/20 79.2 kg  07/07/20 79.4 kg  05/11/20 80.4 kg    Labs: Lab Results  Component Value Date   NA 139 12/03/2019   K 4.4 12/03/2019   CL 106 12/03/2019   CO2 21 (L) 12/03/2019   GLUCOSE 120 (H) 12/03/2019   BUN 18 12/03/2019   CREATININE 1.11 (H) 12/03/2019   CALCIUM 9.1 12/03/2019   MG 2.2 06/07/2017   Lab Results  Component Value Date   INR 1.12 04/20/2016   No results found for: CHOL, HDL, LDLCALC, TRIG   GEN- The patient is well appearing, alert and oriented x 3 today.   Head- normocephalic, atraumatic Eyes-  Sclera clear, conjunctiva pink Ears- hearing intact Oropharynx- clear Neck- supple, no JVP Lymph- no cervical lymphadenopathy Lungs- Clear to ausculation bilaterally, normal work of breathing Heart- irregular rate and rhythm, no murmurs, rubs or gallops, PMI not laterally displaced GI- soft, NT, ND, + BS Extremities- no clubbing, cyanosis, or edema MS- no significant deformity or atrophy Skin- no rash or lesion Psych- euthymic mood, full affect Neuro- strength and sensation are intact  EKG- a fib at 87 bpm, qrs int 88 ms, qtc 471 ms Epic records reviewed   Assessment and Plan: 1. Persistent afib Multiple cardioversion's in the past 1-2 years Last cardioversion, 12/31/19, was successful but  with significant  weakness 2/2 bradycardia Sinus brady resolved with reduction of BB and amiodarone reduced  back to 200 mg qd  However returned to  afib  Discussed on previous visit in April,  Natasha felt that amiodarone was  no longer  effective to maintain SR and Natasha could  not keep her at 200 mg bid as she may get toxic on it. Natasha thought  it was best to stop drug and rate control  She has had good rate control in afib but really desired to get back in SR, has a lot of fatigue," feels terrible"  We discussed Tikosyn and she really wanted to pursue, qtc appears to be  slightly prolonged in afib today, but appeared better in SR with amio on board at 460 ms  Natasha discussed with A. Tillery PA re my concerns re qt. Zoloft  is a Automotive engineer as well but  PharmD did not say that it had to be stopped  Pt is also aware that Natasha can not promise if she will be able to get on the dtug with borderline qt, but she would appreciate the chance to restore SR No benadryl use   2. HTN Elevated today   3. CHA2DS2VASc score of 6 Eliquis 5 mg bid  States no missed doses of eliquis for at least 3 weeks  To 6 E when bed ready   Butch Penny C. Katerina Zurn, [facility] [address] [place], [place] [address] [phone]        Primary Care Physician: Lavone Orn, MD Cardiologist: [name] Pulmonologist: Dr. Elsworth Soho GI: Dr. Dolphus Jenny Natasha [name] is a 86 y.o. female with a h/o afib that has been on amiodarone since June of 2015. She has had many breakthrough events, last one in February with successful cardioversion. She does feel very weak in  afib. She went into afib around 2 weeks ago and a RN came out to her house on Friday and documented the afib.  Dr. Rayann Heman was called and left instructions to increase amio to 200 mg bid and stop losartan. Apparently her BP was low on that day.  She is in now in the afib clinc and she is rate controlled and BP is stable. She reports around 2-3 weeks ago she first had 2 teeth pulled and then a shot of cortisone in her knee. She hasn't felt well since. She usually requires a cardioversion to restore SR.    F/u in afib clinic 7/15 after successful cardioversion. She is in SR  today and feels improved. She is back to Amio 200 mg daily.  F/u in afib clinic, 11/25/19, with feelings of not being well and being out of rhythm x one week. She has had an UTI and just finished a round of doxycycline. EKG shows atrial flutter  variable rate at 110 bpm.CHA2DS2-VASc Score = 6, continues eliquis 5 mg bid. 1   F/u in  afib clinic, 12/16/18. She had a successful cardioversion but unfortunately felt like it only lasted one day. She is in afib in the 90's. Natasha will try to increase BB and keep amio  at 200 mg a little longer to see if she may return to [place].   F/u  in afib clinic, 01/15/20.  F/u cardioversion 2/17. She was kept on her higher dose of BB at time of cardioversion as she did covert out but with PAC's. She saw [name] one week out and was staying in [place]. She  was left on amiodarone 200 mg bid until f/u here. She called the office earlier in the week and c/o of feeling weak. EKG shows  Sinus brady at 48 bpm. Natasha feel her bradycardia is the reason for her weakness and will titrate meds down today.   F/u 01/22/20. She remains  in SR  in the low 60's but does not feel that much better.  Back on amiodarone 200 mg daily from  BID and lower dose of BB. Marland Kitchen She had some GI distress yesterday and recently had issues with UTI. She  denies urgency or frequency today.  F/u in afib clinic, 4/1, as pt has gone back into afib. Discussed with her Natasha feel that amiodarone is no longer effective and Natasha think it is best to stop drug at this point and rate control. If qt shortens off 3 month amiodarone wash out, currently at 512 ms then tikosyn may be an option down the line. She has multiple cardioversions with significant ERAF.  F/u in afib clinic, 4/14. She is feeling better off amiodarone and she is rate controlled in the 80's but would like to see her a little slower so will increase BB today. We can consider Tikosyn after  3 month amiodarone wash out if qt interval shortens.    She actually is feeling better  and had the enegy to do more around her house and some light exercise. She is upset today as she had some diarrhea this am and had an accident in the clinic.Natasha feel this is why her BP is higher today as she usually  does not have high BP. She feels it may the the plate of food her neighbor gave her last night. She is rate controlled at home in the 70's and 80's.   F/u in afib clinic, 6/29. She remains in rate controlled afib and states  that she feels very good. She is exercising without any significant shortness of breath and is rate controlled. No excessive fluid retention. Natasha feel that amiodarone may have been contributing to fatigue and she feels better off drug. She remains on eliquis 5 mg bid for a CHA2DS2VASc score of 6.  Today, she denies symptoms of chest pain, shortness of breath, orthopnea, PND, lower extremity edema, dizziness, presyncope, syncope, or neurologic sequela.Perisistent cough. The patient is tolerating medications without difficulties and is otherwise without complaint today.   Past Medical History:  Diagnosis Date  . Acute bronchitis 11/15/2017  . ADENOCARCINOMA, BREAST 10/13/2010   Qualifier: Diagnosis of  By: Nils Pyle CMA (AAMA), Mearl Latin    . 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 Allergy    SEASONAL  . Anemia    years ago  . Anticoagulated by anticoagulation treatment - Eliquis 03/30/2014   Started May 2015   . Anxiety state 10/13/2010   Qualifier: Diagnosis of  By: Nils Pyle CMA ([place]), Mearl Latin    . Arthritis    "all my joints" (03/31/2014)  . Barrett's esophagus   . Breast cancer [facility])    s/p right breast lumpectomy  . CARDIAC MURMUR 10/13/2010   Qualifier: Diagnosis of  By: Nils Pyle CMA ([place]), Mearl Latin    . 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 Cervical cancer ([place])   . Chronic cough 01/02/2017   GERD, hiatal hernia Doubt amio  . COLONIC POLYPS, ADENOMATOUS, HX OF 10/13/2010   Qualifier: Diagnosis of  By: Nils Pyle CMA (AAMA), Mearl Latin    . Degenerative disorder of  eye 07/25/2012  . Diabetes mellitus without complication ([place])   . Dilated cardiomyopathy ([place]) 08/27/2018  . DIVERTICULOSIS, COLON 10/13/2010   Qualifier: Diagnosis of  By: Nils Pyle CMA (AAMA), Mearl Latin    . DJD (degenerative joint disease) of knee   . Dysrhythmia    Afib  . Echocardiogram 07/2020    Echo 9/21: EF 70-75, moderate LVH, GR 1 DD, RVSP 40.5 (mildly elevated), normal RVSF, moderate LAE, mild RAE, mild MR, trivial AI  . EROSIVE ESOPHAGITIS 10/13/2010   Qualifier: Diagnosis of  By: Nils Pyle CMA ([place]), Mearl Latin    . Error, refractive, myopia 07/09/2012  . Essential hypertension 10/13/2010   Qualifier: Diagnosis of  By: Nils Pyle CMA ([place]), Mearl Latin    . Exotropia, intermittent 07/09/2012  . GERD 10/14/2010   Qualifier: Diagnosis of  By: Sharlett Iles MD Byrd Hesselbach GERD (gastroesophageal reflux disease)   . Glaucoma   . H/O hiatal hernia   . Heart murmur   . Hiatal hernia s/p robotic repair & fundoplication 12/15/5425 04/14/3761   Qualifier: Diagnosis of  By: Nils Pyle CMA (AAMA), Mearl Latin    . History of laser assisted in situ keratomileusis 07/25/2012  . Hx of transesophageal echocardiography (TEE) for monitoring    TEE (03/2014): No LAA clot, moderate LAE, core triatriatum type structure in RA with no stenosis, normal EF 55-60%, mild MR  . HYPERCHOLESTEROLEMIA 10/13/2010   Qualifier: Diagnosis of  By: Nils Pyle CMA ([place]), Mearl Latin    . Hypertension   . Hypothyroidism, postsurgical   . Iron Deficiency Anemia 10/14/2010   Qualifier: Diagnosis of  By: Sharlett Iles MD Byrd Hesselbach   . Irritable bowel syndrome 10/13/2010   Qualifier: Diagnosis of  By: Nils Pyle CMA (AAMA), Mearl Latin    . Lumbar stenosis with neurogenic claudication 04/20/2016  . Malignant neoplasm of breast ([place]) 02/25/2016  . Migraine    "last one was years ago" (03/31/2014)  . MIGRAINE HEADACHE 10/13/2010  Qualifier: Diagnosis of  By: Nils Pyle CMA ([place]), Mearl Latin    . 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 OSA on CPAP    settings  at 10-11   . OSTEOARTHRITIS 10/13/2010   Qualifier: Diagnosis of  By: Nils Pyle CMA ([place]), Mearl Latin    . Persistent atrial fibrillation 03/31/2015   DLCO dropped from 100% in 2015-50% in 01/2017  . Post-surgical hypothyroidism 04/09/2014  . RECTAL FISSURE 10/13/2010   Qualifier: Diagnosis of  By: Nils Pyle CMA ([place]), Mearl Latin    . S/P Nissen fundoplication (without gastrostomy tube) procedure 05/25/2017  . Sleep apnea   . THYROID CANCER 10/13/2010   Qualifier: Diagnosis of  By: Nils Pyle CMA ([place]), Mearl Latin    . Thyroid cancer [facility])    s/p thyroidectomy  . Type II diabetes mellitus ([place])    type II    Past Surgical History:  Procedure Laterality Date  . ABDOMINAL HYSTERECTOMY     "partial"  . BREAST BIOPSY Right   . BREAST LUMPECTOMY Right   . CARDIOVERSION N/A 03/30/2014   Procedure: CARDIOVERSION - BEDSIDE;  Surgeon: [name], MD;  Location: [place];  Service: Cardiovascular;  Laterality: N/A;  . CARDIOVERSION N/A 03/31/2014   Procedure: CARDIOVERSION  (BEDSIDE) ;  Surgeon: Lelon Perla, MD;  Location: [place];  Service: Cardiovascular;  Laterality: N/A;  . CARDIOVERSION N/A 04/27/2014   Procedure: CARDIOVERSION;  Surgeon: Darlin Coco, MD;  Location: [place];  Service: Cardiovascular;  Laterality: N/A;  . CARDIOVERSION N/A 04/13/2015   Procedure: CARDIOVERSION;  Surgeon: Thayer Headings, MD;  Location: [place];  Service: Cardiovascular;  Laterality: N/A;  . CARDIOVERSION N/A 01/08/2017   Procedure: CARDIOVERSION;  Surgeon: Sanda Klein, MD;  Location: [facility] ENDOSCOPY;  Service: Cardiovascular;  Laterality: N/A;  . CARDIOVERSION N/A 09/19/2017   Procedure: CARDIOVERSION;  Surgeon: [name], MD;  Location: [place];  Service: Cardiovascular;  Laterality: N/A;  . CARDIOVERSION N/A 10/25/2017   Procedure: CARDIOVERSION;  Surgeon: Acie Fredrickson Wonda Cheng, MD;  Location: [place];  Service: Cardiovascular;  Laterality: N/A;  . CARDIOVERSION N/A 12/27/2018   Procedure:  CARDIOVERSION;  Surgeon: [name], MD;  Location: [place];  Service: Cardiovascular;  Laterality: N/A;  . CARDIOVERSION N/A 05/20/2019   Procedure: CARDIOVERSION;  Surgeon: [name], MD;  Location: [facility] ENDOSCOPY;  Service: Cardiovascular;  Laterality: N/A;  . CARDIOVERSION N/A 12/10/2019   Procedure: CARDIOVERSION;  Surgeon: Pixie Casino, MD;  Location: [facility] ENDOSCOPY;  Service: Cardiovascular;  Laterality: N/A;  . CARDIOVERSION N/A 12/31/2019   Procedure: CARDIOVERSION;  Surgeon: Sanda Klein, MD;  Location: [facility] ENDOSCOPY;  Service: Cardiovascular;  Laterality: N/A;  . CARPAL TUNNEL RELEASE Right   . CATARACT EXTRACTION W/ INTRAOCULAR LENS  IMPLANT, BILATERAL Bilateral   . COLONOSCOPY    . EXCISIONAL HEMORRHOIDECTOMY    . INSERTION OF MESH N/A 05/25/2017   Procedure: INSERTION OF MESH;  Surgeon: Ralene Ok, MD;  Location: [facility];  Service: General;  Laterality: N/A;  . TEE WITHOUT CARDIOVERSION N/A 03/30/2014   Procedure: TRANSESOPHAGEAL ECHOCARDIOGRAM (TEE);  Surgeon: [name], MD;  Location: [facility] ENDOSCOPY;  Service: Cardiovascular;  Laterality: N/A;  . THYROIDECTOMY    . TONSILLECTOMY      Current Outpatient Medications  Medication Sig Dispense Refill  . ACCU-CHEK GUIDE test strip     . acetaminophen (TYLENOL) 650 MG CR tablet Take 650 mg by mouth every 8 (eight) hours.     . Blood Glucose Monitoring Suppl (ACCU-CHEK GUIDE  ME) w/Device KIT     . calcium carbonate (OSCAL) 1500 (600 Ca) MG TABS tablet Take 600 mg of elemental calcium by mouth daily.    . cetirizine (ZYRTEC) 10 MG tablet Take 10 mg by mouth daily.     . Cholecalciferol (DIALYVITE VITAMIN D 5000) 125 MCG (5000 UT) capsule Take 5,000 Units by mouth daily.    Marland Kitchen ELIQUIS 5 MG TABS tablet TAKE 1 TABLET TWICE DAILY 180 tablet 1  . ferrous sulfate 325 (65 FE) MG tablet Take 325 mg by mouth daily with breakfast.    . fluticasone (FLONASE) 50 MCG/ACT nasal spray Place 1 spray into both nostrils at  bedtime.     . lansoprazole (PREVACID) 30 MG capsule Take 30 mg by mouth daily.     Marland Kitchen levothyroxine (SYNTHROID) 137 MCG tablet Take 137 mcg by mouth daily.     Marland Kitchen LORazepam (ATIVAN) 1 MG tablet Take 1.5 mg by mouth at bedtime.     . Lutein 6 MG CAPS Take 6 mg by mouth once a week.     . metFORMIN (GLUCOPHAGE-XR) 500 MG 24 hr tablet Take 500 mg by mouth 2 (two) times daily.     . metoprolol tartrate (LOPRESSOR) 50 MG tablet Take 1 tablet (50 mg total) by mouth 2 (two) times daily.    . metoprolol tartrate (LOPRESSOR) 50 MG tablet Take 1 tablet (50 mg total) by mouth 2 (two) times daily. 180 tablet 1  . Multiple Vitamins-Minerals (PRESERVISION AREDS 2) CAPS Take 1 capsule by mouth daily.    . Multiple Vitamins-Minerals (ZINC PO) Take 0.5 tablets by mouth 2 (two) times a week.     . Polyvinyl Alcohol-Povidone PF (REFRESH) 1.4-0.6 % SOLN Place 1 drop into both eyes daily as needed (dry eyes).     . potassium chloride SA (KLOR-CON) 20 MEQ tablet Take 1 tablet (20 mEq total) by mouth daily. 90 tablet 3  . rosuvastatin (CRESTOR) 10 MG tablet Take 10 mg by mouth at bedtime.    . sertraline (ZOLOFT) 50 MG tablet Take 50 mg by mouth daily.    . vitamin B-12 (CYANOCOBALAMIN) 1000 MCG tablet Take 1,000 mcg by mouth as needed (energy).      No current facility-administered medications for this encounter.    Allergies  Allergen Reactions  . Penicillins Itching, Rash and Other (See Comments)    Did it involve swelling of the face/tongue/throat, SOB, or low BP? No Did it involve sudden or severe rash/hives, skin peeling, or any reaction on the inside of your mouth or nose? No Did you need to seek medical attention at a hospital or doctor's office? No When did it last happen?30+ years If all above answers are "NO", may proceed with cephalosporin use.     Social History   Socioeconomic History  . Marital status: Divorced    Spouse name: Not on file  . Number of children: 2  . Years of  education: Not on file  . Highest education level: Not on file  Occupational History  . Occupation: Retired    Comment: Education officer, museum  Tobacco Use  . Smoking status: Never Smoker  . Smokeless tobacco: Never Used  Vaping Use  . Vaping Use: Never used  Substance and Sexual Activity  . Alcohol use: No    Alcohol/week: 0.0 standard drinks  . Drug use: No  . Sexual activity: Never  Other Topics Concern  . Not on file  Social History Narrative  . Not on file  Social Determinants of Health   Financial Resource Strain:   . Difficulty of Paying Living Expenses: Not on file  Food Insecurity:   . Worried About Charity fundraiser in the Last Year: Not on file  . Ran Out of Food in the Last Year: Not on file  Transportation Needs:   . Lack of Transportation (Medical): Not on file  . Lack of Transportation (Non-Medical): Not on file  Physical Activity:   . Days of Exercise per Week: Not on file  . Minutes of Exercise per Session: Not on file  Stress:   . Feeling of Stress : Not on file  Social Connections:   . Frequency of Communication with Friends and Family: Not on file  . Frequency of Social Gatherings with Friends and Family: Not on file  . Attends Religious Services: Not on file  . Active Member of Clubs or Organizations: Not on file  . Attends Archivist Meetings: Not on file  . Marital Status: Not on file  Intimate Partner Violence:   . Fear of Current or Ex-Partner: Not on file  . Emotionally Abused: Not on file  . Physically Abused: Not on file  . Sexually Abused: Not on file    Family History  Problem Relation Age of Onset  . Prostate cancer Father   . Diabetes Sister   . Heart disease Sister   . Heart attack Brother   . Heart disease Brother   . Diabetes Brother   . Diabetes Sister   . Diabetes Sister   . Pancreatic cancer Sister   . Diabetes Brother   . Heart disease Brother   . Diabetes Brother   . Heart disease Brother   . Diabetes Brother    . Heart disease Brother   . Diabetes Brother   . Heart disease Brother   . Diabetes Brother   . Heart disease Brother   . Diabetes Brother   . Diabetes Child     ROS- All systems are reviewed and negative except as per the HPI above  Physical Exam: There were no vitals filed for this visit. Wt Readings from Last 3 Encounters:  08/04/20 79.2 kg  07/07/20 79.4 kg  05/11/20 80.4 kg    Labs: Lab Results  Component Value Date   NA 139 12/03/2019   K 4.4 12/03/2019   CL 106 12/03/2019   CO2 21 (L) 12/03/2019   GLUCOSE 120 (H) 12/03/2019   BUN 18 12/03/2019   CREATININE 1.11 (H) 12/03/2019   CALCIUM 9.1 12/03/2019   MG 2.2 06/07/2017   Lab Results  Component Value Date   INR 1.12 04/20/2016   No results found for: CHOL, HDL, LDLCALC, TRIG   GEN- The patient is well appearing, alert and oriented x 3 today.   Head- normocephalic, atraumatic Eyes-  Sclera clear, conjunctiva pink Ears- hearing intact Oropharynx- clear Neck- supple, no JVP Lymph- no cervical lymphadenopathy Lungs- Clear to ausculation bilaterally, normal work of breathing Heart- irregular rate and rhythm, no murmurs, rubs or gallops, PMI not laterally displaced GI- soft, NT, ND, + BS Extremities- no clubbing, cyanosis, or edema MS- no significant deformity or atrophy Skin- no rash or lesion Psych- euthymic mood, full affect Neuro- strength and sensation are intact  EKG- a flutter  at 98 bpm,  qrs int 100 ms, qt int 449   ms  Epic records reviewed   Assessment and Plan: 1. Persistent afib Multiple cardioversion's in the past  Last cardioversion, 2/17, was  successful but  with significant  weakness 2/2 bradycardia Sinus brady resolved with reduction of BB and amiodarone redcued  back to 200 mg qd  However returned to  afib  Discussed on previous  visit  with pt Natasha feel that amiodarone is no longer effective to maintain SR and Natasha can not keep her at 200 mg bid as she may get toxic on it. Natasha think it  is best to stop drug and rate control  Continue metoprolol to 25 (1 1/2 mg)  bid  She actually is feeling better off amiodarone and being  rate controlled  She may be a Tikosyn candidate after 3 month amio washout if qt shortens and she does not tolerate rate control    2. HTN Stable   3. CHA2DS2VASc score of 6 Eliquis 5 mg bid   F/u in afib clinic in 3 months    Butch Penny C. Cris Gibby, [facility] [address] [place], [place] [address] [phone]

## 2020-10-19 ENCOUNTER — Encounter (HOSPITAL_COMMUNITY): Payer: Self-pay | Admitting: Nurse Practitioner

## 2020-10-19 ENCOUNTER — Ambulatory Visit (HOSPITAL_COMMUNITY)
Admission: RE | Admit: 2020-10-19 | Discharge: 2020-10-19 | Disposition: A | Discharge status: Home / Self Care | Payer: Medicare PPO | Source: Ambulatory Visit | Attending: Nurse Practitioner | Admitting: Nurse Practitioner

## 2020-10-19 ENCOUNTER — Inpatient Hospital Stay (HOSPITAL_COMMUNITY)
Admission: AD | Admit: 2020-10-19 | Discharge: 2020-10-24 | DRG: 310 | Disposition: A | Discharge status: Home / Self Care | Payer: Medicare PPO | Source: Ambulatory Visit | Attending: Internal Medicine | Admitting: Internal Medicine

## 2020-10-19 ENCOUNTER — Other Ambulatory Visit: Payer: Self-pay

## 2020-10-19 ENCOUNTER — Encounter (HOSPITAL_COMMUNITY): Payer: Self-pay | Admitting: Internal Medicine

## 2020-10-19 VITALS — BP 166/98 | HR 113 | Ht 66.0 in | Wt 177.6 lb

## 2020-10-19 DIAGNOSIS — K589 Irritable bowel syndrome without diarrhea: Secondary | ICD-10-CM | POA: Diagnosis present

## 2020-10-19 DIAGNOSIS — Z79890 Hormone replacement therapy (postmenopausal): Secondary | ICD-10-CM

## 2020-10-19 DIAGNOSIS — E119 Type 2 diabetes mellitus without complications: Secondary | ICD-10-CM

## 2020-10-19 DIAGNOSIS — I472 Ventricular tachycardia: Secondary | ICD-10-CM | POA: Diagnosis not present

## 2020-10-19 DIAGNOSIS — J302 Other seasonal allergic rhinitis: Secondary | ICD-10-CM | POA: Diagnosis present

## 2020-10-19 DIAGNOSIS — Z7901 Long term (current) use of anticoagulants: Secondary | ICD-10-CM

## 2020-10-19 DIAGNOSIS — I4819 Other persistent atrial fibrillation: Secondary | ICD-10-CM

## 2020-10-19 DIAGNOSIS — Z79899 Other long term (current) drug therapy: Secondary | ICD-10-CM | POA: Diagnosis not present

## 2020-10-19 DIAGNOSIS — D6869 Other thrombophilia: Secondary | ICD-10-CM

## 2020-10-19 DIAGNOSIS — Z90710 Acquired absence of both cervix and uterus: Secondary | ICD-10-CM | POA: Diagnosis not present

## 2020-10-19 DIAGNOSIS — I10 Essential (primary) hypertension: Secondary | ICD-10-CM | POA: Diagnosis present

## 2020-10-19 DIAGNOSIS — H409 Unspecified glaucoma: Secondary | ICD-10-CM | POA: Diagnosis present

## 2020-10-19 DIAGNOSIS — D509 Iron deficiency anemia, unspecified: Secondary | ICD-10-CM | POA: Diagnosis not present

## 2020-10-19 DIAGNOSIS — I420 Dilated cardiomyopathy: Secondary | ICD-10-CM | POA: Diagnosis not present

## 2020-10-19 DIAGNOSIS — Z7984 Long term (current) use of oral hypoglycemic drugs: Secondary | ICD-10-CM | POA: Diagnosis not present

## 2020-10-19 DIAGNOSIS — Z8541 Personal history of malignant neoplasm of cervix uteri: Secondary | ICD-10-CM

## 2020-10-19 DIAGNOSIS — K219 Gastro-esophageal reflux disease without esophagitis: Secondary | ICD-10-CM | POA: Diagnosis present

## 2020-10-19 DIAGNOSIS — Z9989 Dependence on other enabling machines and devices: Secondary | ICD-10-CM | POA: Diagnosis not present

## 2020-10-19 DIAGNOSIS — E890 Postprocedural hypothyroidism: Secondary | ICD-10-CM | POA: Diagnosis present

## 2020-10-19 DIAGNOSIS — I4891 Unspecified atrial fibrillation: Secondary | ICD-10-CM

## 2020-10-19 DIAGNOSIS — R001 Bradycardia, unspecified: Secondary | ICD-10-CM | POA: Diagnosis not present

## 2020-10-19 DIAGNOSIS — Z853 Personal history of malignant neoplasm of breast: Secondary | ICD-10-CM | POA: Diagnosis not present

## 2020-10-19 DIAGNOSIS — G4733 Obstructive sleep apnea (adult) (pediatric): Secondary | ICD-10-CM | POA: Diagnosis present

## 2020-10-19 DIAGNOSIS — E7800 Pure hypercholesterolemia, unspecified: Secondary | ICD-10-CM | POA: Diagnosis not present

## 2020-10-19 DIAGNOSIS — Z85850 Personal history of malignant neoplasm of thyroid: Secondary | ICD-10-CM

## 2020-10-19 DIAGNOSIS — M25561 Pain in right knee: Secondary | ICD-10-CM | POA: Diagnosis not present

## 2020-10-19 DIAGNOSIS — F419 Anxiety disorder, unspecified: Secondary | ICD-10-CM | POA: Diagnosis present

## 2020-10-19 DIAGNOSIS — M1990 Unspecified osteoarthritis, unspecified site: Secondary | ICD-10-CM | POA: Diagnosis present

## 2020-10-19 DIAGNOSIS — Z880 Allergy status to penicillin: Secondary | ICD-10-CM | POA: Diagnosis not present

## 2020-10-19 LAB — BASIC METABOLIC PANEL
Anion gap: 8 (ref 5–15)
BUN: 17 mg/dL (ref 8–23)
CO2: 25 mmol/L (ref 22–32)
Calcium: 9.3 mg/dL (ref 8.9–10.3)
Chloride: 105 mmol/L (ref 98–111)
Creatinine, Ser: 0.78 mg/dL (ref 0.44–1.00)
GFR, Estimated: >60 mL/min (ref >60)
Glucose, Bld: 130 mg/dL — ABNORMAL HIGH (ref 70–99)
Potassium: 4.3 mmol/L (ref 3.5–5.1)
Sodium: 138 mmol/L (ref 135–145)

## 2020-10-19 LAB — MAGNESIUM: Magnesium: 1.9 mg/dL (ref 1.7–2.4)

## 2020-10-19 LAB — GLUCOSE, CAPILLARY: Glucose-Capillary: 116 mg/dL — ABNORMAL HIGH (ref 70–99)

## 2020-10-19 MED ORDER — ROSUVASTATIN CALCIUM 5 MG PO TABS
10.0000 mg | ORAL_TABLET | Freq: Every day | ORAL | Status: DC
  Administered 2020-10-19 – 2020-10-23 (×5): 10 mg via ORAL
  Filled 2020-10-19 (×5): qty 2

## 2020-10-19 MED ORDER — VITAMIN D 25 MCG (1000 UNIT) PO TABS
5000.0000 [IU] | ORAL_TABLET | Freq: Every day | ORAL | Status: DC
  Administered 2020-10-20 – 2020-10-24 (×5): 5000 [IU] via ORAL
  Filled 2020-10-19 (×6): qty 5

## 2020-10-19 MED ORDER — VITAMIN B-12 1000 MCG PO TABS: 1000.0000 ug | ORAL_TABLET | Freq: Every day | ORAL | Status: DC

## 2020-10-19 MED ORDER — ROSUVASTATIN CALCIUM 5 MG PO TABS: 10.0000 mg | ORAL_TABLET | Freq: Every day | ORAL | Status: DC

## 2020-10-19 MED ORDER — VITAMIN D3 25 MCG (1000 UNIT) PO TABS: 5000.0000 [IU] | ORAL_TABLET | Freq: Every day | ORAL | Status: DC

## 2020-10-19 MED ORDER — LORATADINE 10 MG PO TABS
10.0000 mg | ORAL_TABLET | Freq: Every day | ORAL | Status: DC
  Administered 2020-10-20 – 2020-10-24 (×5): 10 mg via ORAL
  Filled 2020-10-19 (×5): qty 1

## 2020-10-19 MED ORDER — SERTRALINE HCL 50 MG PO TABS: 50.0000 mg | ORAL_TABLET | Freq: Every day | ORAL | Status: DC

## 2020-10-19 MED ORDER — LEVOTHYROXINE SODIUM 112 MCG PO TABS: 112.0000 ug | ORAL_TABLET | Freq: Every day | ORAL | Status: DC

## 2020-10-19 MED ORDER — SODIUM CHLORIDE 0.9% FLUSH: 3.0000 mL | INTRAVENOUS | Status: DC | PRN

## 2020-10-19 MED ORDER — POTASSIUM CHLORIDE CRYS ER 20 MEQ PO TBCR
20.0000 meq | EXTENDED_RELEASE_TABLET | Freq: Every day | ORAL | Status: DC
  Administered 2020-10-20 – 2020-10-22 (×3): 20 meq via ORAL
  Filled 2020-10-19 (×3): qty 1

## 2020-10-19 MED ORDER — APIXABAN 5 MG PO TABS
5.0000 mg | ORAL_TABLET | Freq: Two times a day (BID) | ORAL | Status: DC
  Administered 2020-10-19 – 2020-10-24 (×10): 5 mg via ORAL
  Filled 2020-10-19 (×10): qty 1

## 2020-10-19 MED ORDER — LORAZEPAM 0.5 MG PO TABS: 1.5000 mg | ORAL_TABLET | Freq: Every day | ORAL | Status: DC

## 2020-10-19 MED ORDER — METOPROLOL TARTRATE 25 MG PO TABS
25.0000 mg | ORAL_TABLET | Freq: Two times a day (BID) | ORAL | Status: DC
  Administered 2020-10-19 – 2020-10-24 (×10): 25 mg via ORAL
  Filled 2020-10-19 (×10): qty 1

## 2020-10-19 MED ORDER — SODIUM CHLORIDE 0.9% FLUSH
3.0000 mL | Freq: Two times a day (BID) | INTRAVENOUS | Status: DC
  Administered 2020-10-19 – 2020-10-24 (×9): 3 mL via INTRAVENOUS

## 2020-10-19 MED ORDER — DOFETILIDE 500 MCG PO CAPS
500.0000 ug | ORAL_CAPSULE | Freq: Two times a day (BID) | ORAL | Status: DC
  Administered 2020-10-19 – 2020-10-22 (×6): 500 ug via ORAL
  Filled 2020-10-19 (×6): qty 1

## 2020-10-19 MED ORDER — ACETAMINOPHEN 325 MG PO TABS: 650.0000 mg | ORAL_TABLET | Freq: Two times a day (BID) | ORAL | Status: DC | PRN

## 2020-10-19 MED ORDER — VITAMIN B-12 1000 MCG PO TABS
1000.0000 ug | ORAL_TABLET | Freq: Every day | ORAL | Status: DC
  Administered 2020-10-20 – 2020-10-24 (×5): 1000 ug via ORAL
  Filled 2020-10-19 (×5): qty 1

## 2020-10-19 MED ORDER — SODIUM CHLORIDE 0.9 % IV SOLN: 250.0000 mL | INTRAVENOUS | Status: DC | PRN

## 2020-10-19 MED ORDER — METFORMIN HCL ER 500 MG PO TB24
500.0000 mg | ORAL_TABLET | Freq: Two times a day (BID) | ORAL | Status: DC
  Administered 2020-10-20 – 2020-10-23 (×4): 500 mg via ORAL
  Filled 2020-10-19 (×11): qty 1

## 2020-10-19 MED ORDER — SERTRALINE HCL 50 MG PO TABS
50.0000 mg | ORAL_TABLET | Freq: Every day | ORAL | Status: DC
  Administered 2020-10-20 – 2020-10-24 (×5): 50 mg via ORAL
  Filled 2020-10-19 (×5): qty 1

## 2020-10-19 MED ORDER — MAGNESIUM SULFATE 2 GM/50ML IV SOLN
2.0000 g | Freq: Once | INTRAVENOUS | Status: AC
  Administered 2020-10-19: 2 g via INTRAVENOUS
  Filled 2020-10-19: qty 50

## 2020-10-19 MED ORDER — LORATADINE 10 MG PO TABS: 10.0000 mg | ORAL_TABLET | Freq: Every day | ORAL | Status: DC

## 2020-10-19 MED ORDER — ACETAMINOPHEN 325 MG PO TABS
650.0000 mg | ORAL_TABLET | Freq: Two times a day (BID) | ORAL | Status: DC | PRN
  Administered 2020-10-19 – 2020-10-21 (×2): 650 mg via ORAL
  Filled 2020-10-19 (×2): qty 2

## 2020-10-19 MED ORDER — PANTOPRAZOLE SODIUM 40 MG PO TBEC: 40.0000 mg | DELAYED_RELEASE_TABLET | Freq: Every day | ORAL | Status: DC

## 2020-10-19 MED ORDER — PANTOPRAZOLE SODIUM 40 MG PO TBEC
40.0000 mg | DELAYED_RELEASE_TABLET | Freq: Every day | ORAL | Status: DC
  Administered 2020-10-20 – 2020-10-24 (×5): 40 mg via ORAL
  Filled 2020-10-19 (×5): qty 1

## 2020-10-19 MED ORDER — POTASSIUM CHLORIDE CRYS ER 20 MEQ PO TBCR: 20.0000 meq | EXTENDED_RELEASE_TABLET | Freq: Every day | ORAL | Status: DC

## 2020-10-19 MED ORDER — LUTEIN 6 MG PO CAPS: 6.0000 mg | ORAL_CAPSULE | Freq: Every day | ORAL | Status: DC

## 2020-10-19 MED ORDER — LEVOTHYROXINE SODIUM 112 MCG PO TABS
112.0000 ug | ORAL_TABLET | Freq: Every day | ORAL | Status: DC
  Administered 2020-10-20 – 2020-10-24 (×5): 112 ug via ORAL
  Filled 2020-10-19 (×5): qty 1

## 2020-10-19 MED ORDER — SODIUM CHLORIDE 0.9% FLUSH: 3.0000 mL | Freq: Two times a day (BID) | INTRAVENOUS | Status: DC

## 2020-10-19 MED ORDER — METFORMIN HCL ER 500 MG PO TB24
500.0000 mg | ORAL_TABLET | Freq: Two times a day (BID) | ORAL | Status: DC
  Administered 2020-10-19: 500 mg via ORAL
  Filled 2020-10-19 (×2): qty 1

## 2020-10-19 MED ORDER — METOPROLOL TARTRATE 25 MG PO TABS: 25.0000 mg | ORAL_TABLET | Freq: Two times a day (BID) | ORAL | Status: DC

## 2020-10-19 MED ORDER — LORAZEPAM 1 MG PO TABS
1.5000 mg | ORAL_TABLET | Freq: Every day | ORAL | Status: DC
  Administered 2020-10-19 – 2020-10-23 (×5): 1.5 mg via ORAL
  Filled 2020-10-19 (×6): qty 1

## 2020-10-19 MED ORDER — APIXABAN 5 MG PO TABS: 5.0000 mg | ORAL_TABLET | Freq: Two times a day (BID) | ORAL | Status: DC

## 2020-10-19 NOTE — H&P (Addendum)
Electrophysiology H&P  Note    Primary Care Physician: Lavone Orn, MD Cardiologist: [name] Pulmonologist: Dr. Elsworth Soho GI: Dr. Dolphus Jenny I Jawad is a 86 y.o. female with a h/o afib that has been on amiodarone since June of 2015. She has had many breakthrough events, last one in February with successful cardioversion. She does feel very weak in afib. She went into afib around 2 weeks ago and a RN came out to her house on Friday and documented the afib.  Dr. Rayann Heman was called and left instructions to increase amio to 200 mg bid and stop losartan. Apparently her BP was low on that day.  She is in now in the afib clinc and she is rate controlled and BP is stable. She reports around 2-3 weeks ago she first had 2 teeth pulled and then a shot of cortisone in her knee. She hasn't felt well since. She usually requires a cardioversion to restore SR.    F/u in afib clinic 7/15 after successful cardioversion. She is in SR today and feels improved. She is back to Amio 200 mg daily.  F/u in afib clinic, 11/25/19, with feelings of not being well and being out of rhythm x one week. She has had an UTI and just finished a round of doxycycline. EKG shows atrial flutter  variable rate at 110 bpm.CHA2DS2-VASc Score = 6, continues eliquis 5 mg bid. 1   F/u in  afib clinic, 12/16/18. She had a successful cardioversion but unfortunately felt like it only lasted one day. She is in afib in the 90's. I will try to increase BB and keep amio  at 200 mg a little longer to see if she may return to [place].   F/u  in afib clinic, 01/15/20.  F/u cardioversion 2/17. She was kept on her higher dose of BB at time of cardioversion as she did covert out but with PAC's. She saw [name] one week out and was staying in [place]. She  was left on amiodarone 200 mg bid until f/u here. She called the office earlier in the week and c/o of feeling weak. EKG shows  Sinus brady at 48 bpm. I feel her bradycardia is the reason for her  weakness and will titrate meds down today.   F/u 01/22/20. She remains  in SR  in the low 60's but does not feel that much better.  Back on amiodarone 200 mg daily from  BID and lower dose of BB. Marland Kitchen She had some GI distress yesterday and recently had issues with UTI. She  denies urgency or frequency today.  F/u in afib clinic, 4/1, as pt has gone back into afib. Discussed with her I feel that amiodarone is no longer effective and I think it is best to stop drug at this point and rate control. If qt shortens off 3 month amiodarone wash out, currently at 512 ms then tikosyn may be an option down the line. She has multiple cardioversions with significant ERAF.  F/u in afib clinic, 4/14. She is feeling better off amiodarone and she is rate controlled in the 80's but would like to see her a little slower so will increase BB today. We can consider Tikosyn after  3 month amiodarone wash out if qt interval shortens.    She actually is feeling better and had the enegy to do more around her house and some light exercise. She is upset today as she had some diarrhea this am and  had an accident in the clinic.i feel this is why her BP is higher today as she usually  does not have high BP. She feels it may the the plate of food her neighbor gave her last night. She is rate controlled at home in the 70's and 80's.   F/u in afib clinic, 6/29. She remains in rate controlled afib and states that she feels very good. She is exercising without any significant shortness of breath and is rate controlled. No excessive fluid retention. I feel that amiodarone may have been contributing to fatigue and she feels better off drug. She remains on eliquis 5 mg bid for a CHA2DS2VASc score of 6.  F/u in afib clinic, 08/04/20. She  remains in rate controlled afib and initially tells me that she is exercising and feels great, but then tells me she  feels bad and would love to get back in rhythm. QT corrected around 430 ms. She has now been  off amiodarone x 5 months.   F/u in afib clinic, 10/19/20,  as pt wants to be admitted for Tikosyn initiation. She  states that she feels terrible in afib. She has been living afib for several months . I cut back her BB yesterday as she is slow in SR when she returns to SR and she is  not quite as well rate controlled today. She has been on 50 mg metoprolol bid with nice rate control.   She failed amiodarone after taking for years  and it was stopped late Spring after multiple cardioversion that would only hold short term.  She  has been off drug for sufficient period of time to try Tikosyn Her qtc today is 480 ms, but in SR with amiodarone on board was 460 ms, EKg  in spring 2015 without amio was 439 ms.  No benadryl use.  No missed anticoagulation. Electrolytes ok for admission with crcl cal at 64 so she will qualify for 500 msc. Covid negative. + vaccines  Today, she denies symptoms of chest pain, shortness of breath, orthopnea, PND, lower extremity edema, dizziness, presyncope, syncope, or neurologic sequela.Perisistent cough. The patient is tolerating medications without difficulties and is otherwise without complaint today.   I examined patient on her arrival to [facility]. Answered her and her daughters questions.   Past Medical History:  Diagnosis Date  . Acute bronchitis 11/15/2017  . ADENOCARCINOMA, BREAST 10/13/2010   Qualifier: Diagnosis of  By: Nils Pyle CMA (AAMA), Mearl Latin    . 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 Allergy    SEASONAL  . Anemia    years ago  . Anticoagulated by anticoagulation treatment - Eliquis 03/30/2014   Started May 2015   . Anxiety state 10/13/2010   Qualifier: Diagnosis of  By: Nils Pyle CMA ([place]), Mearl Latin    . Arthritis    "all my joints" (03/31/2014)  . Barrett's esophagus   . Breast cancer [facility])    s/p right breast lumpectomy  . CARDIAC MURMUR 10/13/2010   Qualifier: Diagnosis of  By: Nils Pyle CMA ([place]), Mearl Latin    . 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  Cervical cancer ([place])   . Chronic cough 01/02/2017   GERD, hiatal hernia Doubt amio  . COLONIC POLYPS, ADENOMATOUS, HX OF 10/13/2010   Qualifier: Diagnosis of  By: Nils Pyle CMA (AAMA), Mearl Latin    . Degenerative disorder of eye 07/25/2012  . Diabetes mellitus without complication ([place])   . Dilated cardiomyopathy ([place]) 08/27/2018  . DIVERTICULOSIS, COLON 10/13/2010   Qualifier: Diagnosis of  By: Nils Pyle CMA ([place]), Mearl Latin    . DJD (degenerative joint disease) of knee   . Dysrhythmia    Afib  . Echocardiogram 07/2020    Echo 9/21: EF 70-75, moderate LVH, GR 1 DD, RVSP 40.5 (mildly elevated), normal RVSF, moderate LAE, mild RAE, mild MR, trivial AI  . EROSIVE ESOPHAGITIS 10/13/2010   Qualifier: Diagnosis of  By: Nils Pyle CMA ([place]), Mearl Latin    . Error, refractive, myopia 07/09/2012  . Essential hypertension 10/13/2010   Qualifier: Diagnosis of  By: Nils Pyle CMA ([place]), Mearl Latin    . Exotropia, intermittent 07/09/2012  . GERD 10/14/2010   Qualifier: Diagnosis of  By: Sharlett Iles MD Byrd Hesselbach GERD (gastroesophageal reflux disease)   . Glaucoma   . H/O hiatal hernia   . Heart murmur   . Hiatal hernia s/p robotic repair & fundoplication 02/09/5187 41/04/6062   Qualifier: Diagnosis of  By: Nils Pyle CMA (AAMA), Mearl Latin    . History of laser assisted in situ keratomileusis 07/25/2012  . Hx of transesophageal echocardiography (TEE) for monitoring    TEE (03/2014): No LAA clot, moderate LAE, core triatriatum type structure in RA with no stenosis, normal EF 55-60%, mild MR  . HYPERCHOLESTEROLEMIA 10/13/2010   Qualifier: Diagnosis of  By: Nils Pyle CMA ([place]), Mearl Latin    . Hypertension   . Hypothyroidism, postsurgical   . Iron Deficiency Anemia 10/14/2010   Qualifier: Diagnosis of  By: Sharlett Iles MD Byrd Hesselbach   . Irritable bowel syndrome 10/13/2010   Qualifier: Diagnosis of  By: Nils Pyle CMA (AAMA), Mearl Latin    . Lumbar stenosis with neurogenic claudication 04/20/2016  . Malignant neoplasm of breast ([place]) 02/25/2016  .  Migraine    "last one was years ago" (03/31/2014)  . MIGRAINE HEADACHE 10/13/2010   Qualifier: Diagnosis of  By: Nils Pyle CMA ([place]), Mearl Latin    . 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 OSA on CPAP    settings at 10-11   . OSTEOARTHRITIS 10/13/2010   Qualifier: Diagnosis of  By: Nils Pyle CMA ([place]), Mearl Latin    . Persistent atrial fibrillation 03/31/2015   DLCO dropped from 100% in 2015-50% in 01/2017  . Post-surgical hypothyroidism 04/09/2014  . RECTAL FISSURE 10/13/2010   Qualifier: Diagnosis of  By: Nils Pyle CMA ([place]), Mearl Latin    . S/P Nissen fundoplication (without gastrostomy tube) procedure 05/25/2017  . Sleep apnea   . THYROID CANCER 10/13/2010   Qualifier: Diagnosis of  By: Nils Pyle CMA ([place]), Mearl Latin    . Thyroid cancer [facility])    s/p thyroidectomy  . Type II diabetes mellitus ([place])    type II    Past Surgical History:  Procedure Laterality Date  . ABDOMINAL HYSTERECTOMY     "partial"  . BREAST BIOPSY Right   . BREAST LUMPECTOMY Right   . CARDIOVERSION N/A 03/30/2014   Procedure: CARDIOVERSION - BEDSIDE;  Surgeon: [name], MD;  Location: [place];  Service: Cardiovascular;  Laterality: N/A;  . CARDIOVERSION N/A 03/31/2014   Procedure: CARDIOVERSION  (BEDSIDE) ;  Surgeon: Lelon Perla, MD;  Location: [place];  Service: Cardiovascular;  Laterality: N/A;  . CARDIOVERSION N/A 04/27/2014   Procedure: CARDIOVERSION;  Surgeon: Darlin Coco, MD;  Location: [facility] ENDOSCOPY;  Service: Cardiovascular;  Laterality: N/A;  . CARDIOVERSION N/A 04/13/2015   Procedure: CARDIOVERSION;  Surgeon: Thayer Headings, MD;  Location: [place];  Service: Cardiovascular;  Laterality: N/A;  . CARDIOVERSION N/A 01/08/2017   Procedure: CARDIOVERSION;  Surgeon: Sanda Klein, MD;  Location: [place];  Service: Cardiovascular;  Laterality: N/A;  . CARDIOVERSION N/A 09/19/2017   Procedure: CARDIOVERSION;  Surgeon: [name], MD;  Location: [facility] ENDOSCOPY;  Service: Cardiovascular;  Laterality:  N/A;  . CARDIOVERSION N/A 10/25/2017   Procedure: CARDIOVERSION;  Surgeon: Thayer Headings, MD;  Location: [place];  Service: Cardiovascular;  Laterality: N/A;  . CARDIOVERSION N/A 12/27/2018   Procedure: CARDIOVERSION;  Surgeon: [name], MD;  Location: [facility] ENDOSCOPY;  Service: Cardiovascular;  Laterality: N/A;  . CARDIOVERSION N/A 05/20/2019   Procedure: CARDIOVERSION;  Surgeon: [name], MD;  Location: [facility] ENDOSCOPY;  Service: Cardiovascular;  Laterality: N/A;  . CARDIOVERSION N/A 12/10/2019   Procedure: CARDIOVERSION;  Surgeon: Pixie Casino, MD;  Location: [facility] ENDOSCOPY;  Service: Cardiovascular;  Laterality: N/A;  . CARDIOVERSION N/A 12/31/2019   Procedure: CARDIOVERSION;  Surgeon: Sanda Klein, MD;  Location: [facility] ENDOSCOPY;  Service: Cardiovascular;  Laterality: N/A;  . CARPAL TUNNEL RELEASE Right   . CATARACT EXTRACTION W/ INTRAOCULAR LENS  IMPLANT, BILATERAL Bilateral   . COLONOSCOPY    . EXCISIONAL HEMORRHOIDECTOMY    . INSERTION OF MESH N/A 05/25/2017   Procedure: INSERTION OF MESH;  Surgeon: Ralene Ok, MD;  Location: [facility] ORS;  Service: General;  Laterality: N/A;  . TEE WITHOUT CARDIOVERSION N/A 03/30/2014   Procedure: TRANSESOPHAGEAL ECHOCARDIOGRAM (TEE);  Surgeon: [name], MD;  Location: [facility] ENDOSCOPY;  Service: Cardiovascular;  Laterality: N/A;  . THYROIDECTOMY    . TONSILLECTOMY      Current Facility-Administered Medications  Medication Dose Route Frequency Provider Last Rate Last Admin  . 0.9 %  sodium chloride infusion  250 mL Intravenous PRN Sherran Needs, NP      . acetaminophen (TYLENOL) tablet 650 mg  650 mg Oral BID PRN Sherran Needs, NP      . apixaban Arne [place]) tablet 5 mg  5 mg Oral BID Sherran Needs, NP      . Derrill Memo ON 10/20/2020] cholecalciferol (VITAMIN D) tablet 5,000 Units  5,000 Units Oral Daily Sherran Needs, NP      . dofetilide (TIKOSYN) capsule 500 mcg  500 mcg Oral BID Sherran Needs, NP      . Derrill Memo ON  10/20/2020] levothyroxine (SYNTHROID) tablet 112 mcg  112 mcg Oral Q0600 Sherran Needs, NP      . Derrill Memo ON 10/20/2020] loratadine (CLARITIN) tablet 10 mg  10 mg Oral Daily Sherran Needs, NP      . LORazepam (ATIVAN) tablet 1.5 mg  1.5 mg Oral QHS Sherran Needs, NP      . metFORMIN (GLUCOPHAGE-XR) 24 hr tablet 500 mg  500 mg Oral BID WC Sherran Needs, NP      . metoprolol tartrate (LOPRESSOR) tablet 25 mg  25 mg Oral BID Sherran Needs, NP      . Derrill Memo ON 10/20/2020] pantoprazole (PROTONIX) EC tablet 40 mg  40 mg Oral Daily Sherran Needs, NP      . Derrill Memo ON 10/20/2020] potassium chloride SA (KLOR-CON) CR tablet 20 mEq  20 mEq Oral Daily Sherran Needs, NP      . rosuvastatin (CRESTOR) tablet 10 mg  10 mg Oral QHS Sherran Needs, NP      . Derrill Memo ON 10/20/2020] sertraline (ZOLOFT) tablet 50 mg  50 mg Oral Daily Roderic Palau C, NP      . sodium chloride flush (NS) 0.9 % injection 3 mL  3 mL Intravenous Q12H Roderic Palau  C, NP      . sodium chloride flush (NS) 0.9 % injection 3 mL  3 mL Intravenous PRN Sherran Needs, NP      . Derrill Memo ON 10/20/2020] vitamin B-12 (CYANOCOBALAMIN) tablet 1,000 mcg  1,000 mcg Oral Daily Sherran Needs, NP        Allergies  Allergen Reactions  . Penicillins Itching, Rash and Other (See Comments)    Did it involve swelling of the face/tongue/throat, SOB, or low BP? No Did it involve sudden or severe rash/hives, skin peeling, or any reaction on the inside of your mouth or nose? No Did you need to seek medical attention at a hospital or doctor's office? No When did it last happen?30+ years If all above answers are "NO", may proceed with cephalosporin use.     Social History   Socioeconomic History  . Marital status: Divorced    Spouse name: Not on file  . Number of children: 2  . Years of education: Not on file  . Highest education level: Not on file  Occupational History  . Occupation: Retired    Comment: Education officer, museum   Tobacco Use  . Smoking status: Never Smoker  . Smokeless tobacco: Never Used  Vaping Use  . Vaping Use: Never used  Substance and Sexual Activity  . Alcohol use: No    Alcohol/week: 0.0 standard drinks  . Drug use: No  . Sexual activity: Never  Other Topics Concern  . Not on file  Social History Narrative  . Not on file   Social Determinants of Health   Financial Resource Strain:   . Difficulty of Paying Living Expenses: Not on file  Food Insecurity:   . Worried About Charity fundraiser in the Last Year: Not on file  . Ran Out of Food in the Last Year: Not on file  Transportation Needs:   . Lack of Transportation (Medical): Not on file  . Lack of Transportation (Non-Medical): Not on file  Physical Activity:   . Days of Exercise per Week: Not on file  . Minutes of Exercise per Session: Not on file  Stress:   . Feeling of Stress : Not on file  Social Connections:   . Frequency of Communication with Friends and Family: Not on file  . Frequency of Social Gatherings with Friends and Family: Not on file  . Attends Religious Services: Not on file  . Active Member of Clubs or Organizations: Not on file  . Attends Archivist Meetings: Not on file  . Marital Status: Not on file  Intimate Partner Violence:   . Fear of Current or Ex-Partner: Not on file  . Emotionally Abused: Not on file  . Physically Abused: Not on file  . Sexually Abused: Not on file    Family History  Problem Relation Age of Onset  . Prostate cancer Father   . Diabetes Sister   . Heart disease Sister   . Heart attack Brother   . Heart disease Brother   . Diabetes Brother   . Diabetes Sister   . Diabetes Sister   . Pancreatic cancer Sister   . Diabetes Brother   . Heart disease Brother   . Diabetes Brother   . Heart disease Brother   . Diabetes Brother   . Heart disease Brother   . Diabetes Brother   . Heart disease Brother   . Diabetes Brother   . Heart disease Brother   . Diabetes  Brother   .  Diabetes Child     ROS- All systems are reviewed and negative except as per the HPI above  Physical Exam: Vitals:   10/19/20 1545  BP: (!) 147/86  Pulse: (!) 129  Resp: 20  Temp: (!) 97.4 F (36.3 C)  TempSrc: Oral  Height: 5' 6.5" (1.689 m)   Wt Readings from Last 3 Encounters:  10/19/20 80.6 kg  08/04/20 79.2 kg  07/07/20 79.4 kg    Labs: Lab Results  Component Value Date   NA 138 10/19/2020   K 4.3 10/19/2020   CL 105 10/19/2020   CO2 25 10/19/2020   GLUCOSE 130 (H) 10/19/2020   BUN 17 10/19/2020   CREATININE 0.78 10/19/2020   CALCIUM 9.3 10/19/2020   MG 1.9 10/19/2020   Lab Results  Component Value Date   INR 1.12 04/20/2016   No results found for: CHOL, HDL, LDLCALC, TRIG   GEN- The patient is well appearing, alert and oriented x 3 today.   Head- normocephalic, atraumatic Eyes-  Sclera clear, conjunctiva pink Ears- hearing intact Oropharynx- clear Neck- supple, no JVP Lymph- no cervical lymphadenopathy Lungs- Clear to ausculation bilaterally, normal work of breathing Heart- irregular rate and rhythm, no murmurs, rubs or gallops, PMI not laterally displaced GI- soft, NT, ND, + BS Extremities- no clubbing, cyanosis, or edema MS- no significant deformity or atrophy Skin- no rash or lesion Psych- euthymic mood, full affect Neuro- strength and sensation are intact  EKG- a fib at 87 bpm, qrs int 88 ms, qtc 471 ms Epic records reviewed   Assessment and Plan: 1. Persistent afib Multiple cardioversion's in the past 1-2 years Last cardioversion, 12/31/19, was successful but  with significant  weakness 2/2 bradycardia Sinus brady resolved with reduction of BB and amiodarone reduced  back to 200 mg qd  However returned to  afib  Discussed on previous visit in April,  I felt that amiodarone was  no longer effective to maintain SR and I could  not keep her at 200 mg bid as she may get toxic on it. I thought  it was best to stop drug and rate  control  She has had good rate control in afib but really desired to get back in SR, has a lot of fatigue," feels terrible"  We discussed Tikosyn and she really wanted to pursue, qtc appears to be  slightly prolonged in afib today, but appeared better in SR with amio on board at 460 ms  I discussed with A. Tillery PA re my concerns re qt. Zoloft  is a Automotive engineer as well but  PharmD did not say that it had to be stopped  Pt is also aware that I can not promise if she will be able to get on the dtug with borderline qt, but she would appreciate the chance to restore SR No benadryl use   2. HTN Elevated today   3. CHA2DS2VASc score of 6 Eliquis 5 mg bid  States no missed doses of eliquis for at least 3 weeks  To 6 E when bed ready   Butch Penny C. Carroll, [facility] [address] [place],  [address] [phone]        Primary Care Physician: Lavone Orn, MD Cardiologist: [name] Pulmonologist: Dr. Elsworth Soho GI: Dr. Dolphus Jenny I [name] is a 86 y.o. female with a h/o afib that has been on amiodarone since June of 2015. She has had many breakthrough events, last one in February  with successful cardioversion. She does feel very weak in afib. She went into afib around 2 weeks ago and a RN came out to her house on Friday and documented the afib.  Dr. Rayann Heman was called and left instructions to increase amio to 200 mg bid and stop losartan. Apparently her BP was low on that day.  She is in now in the afib clinc and she is rate controlled and BP is stable. She reports around 2-3 weeks ago she first had 2 teeth pulled and then a shot of cortisone in her knee. She hasn't felt well since. She usually requires a cardioversion to restore SR.    F/u in afib clinic 7/15 after successful cardioversion. She is in SR today and feels improved. She is back to Amio 200 mg daily.  F/u in afib clinic, 11/25/19, with feelings of not being well and being out of  rhythm x one week. She has had an UTI and just finished a round of doxycycline. EKG shows atrial flutter  variable rate at 110 bpm.CHA2DS2-VASc Score = 6, continues eliquis 5 mg bid. 1   F/u in  afib clinic, 12/16/18. She had a successful cardioversion but unfortunately felt like it only lasted one day. She is in afib in the 90's. I will try to increase BB and keep amio  at 200 mg a little longer to see if she may return to [place].   F/u  in afib clinic, 01/15/20.  F/u cardioversion 2/17. She was kept on her higher dose of BB at time of cardioversion as she did covert out but with PAC's. She saw [name] one week out and was staying in [place]. She  was left on amiodarone 200 mg bid until f/u here. She called the office earlier in the week and c/o of feeling weak. EKG shows  Sinus brady at 48 bpm. I feel her bradycardia is the reason for her weakness and will titrate meds down today.   F/u 01/22/20. She remains  in SR  in the low 60's but does not feel that much better.  Back on amiodarone 200 mg daily from  BID and lower dose of BB. Marland Kitchen She had some GI distress yesterday and recently had issues with UTI. She  denies urgency or frequency today.  F/u in afib clinic, 4/1, as pt has gone back into afib. Discussed with her I feel that amiodarone is no longer effective and I think it is best to stop drug at this point and rate control. If qt shortens off 3 month amiodarone wash out, currently at 512 ms then tikosyn may be an option down the line. She has multiple cardioversions with significant ERAF.  F/u in afib clinic, 4/14. She is feeling better off amiodarone and she is rate controlled in the 80's but would like to see her a little slower so will increase BB today. We can consider Tikosyn after  3 month amiodarone wash out if qt interval shortens.    She actually is feeling better and had the enegy to do more around her house and some light exercise. She is upset today as she had some diarrhea this am and had an  accident in the clinic.i feel this is why her BP is higher today as she usually  does not have high BP. She feels it may the the plate of food her neighbor gave her last night. She is rate controlled at home in the 70's and 80's.   F/u in afib clinic,  6/29. She remains in rate controlled afib and states that she feels very good. She is exercising without any significant shortness of breath and is rate controlled. No excessive fluid retention. I feel that amiodarone may have been contributing to fatigue and she feels better off drug. She remains on eliquis 5 mg bid for a CHA2DS2VASc score of 6.  Today, she denies symptoms of chest pain, shortness of breath, orthopnea, PND, lower extremity edema, dizziness, presyncope, syncope, or neurologic sequela.Perisistent cough. The patient is tolerating medications without difficulties and is otherwise without complaint today.   Past Medical History:  Diagnosis Date  . Acute bronchitis 11/15/2017  . ADENOCARCINOMA, BREAST 10/13/2010   Qualifier: Diagnosis of  By: Nils Pyle CMA (AAMA), Mearl Latin    . 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 Allergy    SEASONAL  . Anemia    years ago  . Anticoagulated by anticoagulation treatment - Eliquis 03/30/2014   Started May 2015   . Anxiety state 10/13/2010   Qualifier: Diagnosis of  By: Nils Pyle CMA ([place]), Mearl Latin    . Arthritis    "all my joints" (03/31/2014)  . Barrett's esophagus   . Breast cancer [facility])    s/p right breast lumpectomy  . CARDIAC MURMUR 10/13/2010   Qualifier: Diagnosis of  By: Nils Pyle CMA ([place]), Mearl Latin    . 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 Cervical cancer ([place])   . Chronic cough 01/02/2017   GERD, hiatal hernia Doubt amio  . COLONIC POLYPS, ADENOMATOUS, HX OF 10/13/2010   Qualifier: Diagnosis of  By: Nils Pyle CMA (AAMA), Mearl Latin    . Degenerative disorder of eye 07/25/2012  . Diabetes mellitus without complication ([place])   . Dilated cardiomyopathy ([place]) 08/27/2018  . DIVERTICULOSIS, COLON  10/13/2010   Qualifier: Diagnosis of  By: Nils Pyle CMA (AAMA), Mearl Latin    . DJD (degenerative joint disease) of knee   . Dysrhythmia    Afib  . Echocardiogram 07/2020    Echo 9/21: EF 70-75, moderate LVH, GR 1 DD, RVSP 40.5 (mildly elevated), normal RVSF, moderate LAE, mild RAE, mild MR, trivial AI  . EROSIVE ESOPHAGITIS 10/13/2010   Qualifier: Diagnosis of  By: Nils Pyle CMA ([place]), Mearl Latin    . Error, refractive, myopia 07/09/2012  . Essential hypertension 10/13/2010   Qualifier: Diagnosis of  By: Nils Pyle CMA ([place]), Mearl Latin    . Exotropia, intermittent 07/09/2012  . GERD 10/14/2010   Qualifier: Diagnosis of  By: Sharlett Iles MD Byrd Hesselbach GERD (gastroesophageal reflux disease)   . Glaucoma   . H/O hiatal hernia   . Heart murmur   . Hiatal hernia s/p robotic repair & fundoplication 05/13/7792 90/01/91   Qualifier: Diagnosis of  By: Nils Pyle CMA (AAMA), Mearl Latin    . History of laser assisted in situ keratomileusis 07/25/2012  . Hx of transesophageal echocardiography (TEE) for monitoring    TEE (03/2014): No LAA clot, moderate LAE, core triatriatum type structure in RA with no stenosis, normal EF 55-60%, mild MR  . HYPERCHOLESTEROLEMIA 10/13/2010   Qualifier: Diagnosis of  By: Nils Pyle CMA ([place]), Mearl Latin    . Hypertension   . Hypothyroidism, postsurgical   . Iron Deficiency Anemia 10/14/2010   Qualifier: Diagnosis of  By: Sharlett Iles MD Byrd Hesselbach   . Irritable bowel syndrome 10/13/2010   Qualifier: Diagnosis of  By: Nils Pyle CMA (AAMA), Mearl Latin    . Lumbar stenosis with neurogenic claudication 04/20/2016  . Malignant neoplasm of breast ([place]) 02/25/2016  . Migraine    "last one was  years ago" (03/31/2014)  . MIGRAINE HEADACHE 10/13/2010   Qualifier: Diagnosis of  By: Nils Pyle CMA ([place]), Mearl Latin    . 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 OSA on CPAP    settings at 10-11   . OSTEOARTHRITIS 10/13/2010   Qualifier: Diagnosis of  By: Nils Pyle CMA ([place]), Mearl Latin    . Persistent atrial fibrillation  03/31/2015   DLCO dropped from 100% in 2015-50% in 01/2017  . Post-surgical hypothyroidism 04/09/2014  . RECTAL FISSURE 10/13/2010   Qualifier: Diagnosis of  By: Nils Pyle CMA ([place]), Mearl Latin    . S/P Nissen fundoplication (without gastrostomy tube) procedure 05/25/2017  . Sleep apnea   . THYROID CANCER 10/13/2010   Qualifier: Diagnosis of  By: Nils Pyle CMA ([place]), Mearl Latin    . Thyroid cancer [facility])    s/p thyroidectomy  . Type II diabetes mellitus ([place])    type II    Past Surgical History:  Procedure Laterality Date  . ABDOMINAL HYSTERECTOMY     "partial"  . BREAST BIOPSY Right   . BREAST LUMPECTOMY Right   . CARDIOVERSION N/A 03/30/2014   Procedure: CARDIOVERSION - BEDSIDE;  Surgeon: [name], MD;  Location: [place];  Service: Cardiovascular;  Laterality: N/A;  . CARDIOVERSION N/A 03/31/2014   Procedure: CARDIOVERSION  (BEDSIDE) ;  Surgeon: Lelon Perla, MD;  Location: [place];  Service: Cardiovascular;  Laterality: N/A;  . CARDIOVERSION N/A 04/27/2014   Procedure: CARDIOVERSION;  Surgeon: Darlin Coco, MD;  Location: [place];  Service: Cardiovascular;  Laterality: N/A;  . CARDIOVERSION N/A 04/13/2015   Procedure: CARDIOVERSION;  Surgeon: Thayer Headings, MD;  Location[place];  Service: Cardiovascular;  Laterality: N/A;  . CARDIOVERSION N/A 01/08/2017   Procedure: CARDIOVERSION;  Surgeon: Sanda Klein, MD;  Location[facility] ENDOSCOPY;  Service: Cardiovascular;  Laterality: N/A;  . CARDIOVERSION N/A 09/19/2017   Procedure: CARDIOVERSION;  Surgeon: [name], MD;  Location: [place];  Service: Cardiovascular;  Laterality: N/A;  . CARDIOVERSION N/A 10/25/2017   Procedure: CARDIOVERSION;  Surgeon: Acie Fredrickson Wonda Cheng, MD;  Location: [place];  Service: Cardiovascular;  Laterality: N/A;  . CARDIOVERSION N/A 12/27/2018   Procedure: CARDIOVERSION;  Surgeon: [name], MD;  Location: [place];  Service: Cardiovascular;  Laterality: N/A;  . CARDIOVERSION N/A  05/20/2019   Procedure: CARDIOVERSION;  Surgeon: [name], MD;  Location: [facility] ENDOSCOPY;  Service: Cardiovascular;  Laterality: N/A;  . CARDIOVERSION N/A 12/10/2019   Procedure: CARDIOVERSION;  Surgeon: Pixie Casino, MD;  Location: [facility] ENDOSCOPY;  Service: Cardiovascular;  Laterality: N/A;  . CARDIOVERSION N/A 12/31/2019   Procedure: CARDIOVERSION;  Surgeon: Sanda Klein, MD;  Location: [facility] ENDOSCOPY;  Service: Cardiovascular;  Laterality: N/A;  . CARPAL TUNNEL RELEASE Right   . CATARACT EXTRACTION W/ INTRAOCULAR LENS  IMPLANT, BILATERAL Bilateral   . COLONOSCOPY    . EXCISIONAL HEMORRHOIDECTOMY    . INSERTION OF MESH N/A 05/25/2017   Procedure: INSERTION OF MESH;  Surgeon: Ralene Ok, MD;  Location: [facility];  Service: General;  Laterality: N/A;  . TEE WITHOUT CARDIOVERSION N/A 03/30/2014   Procedure: TRANSESOPHAGEAL ECHOCARDIOGRAM (TEE);  Surgeon: [name], MD;  Location[facility]COPY;  Service: Cardiovascular;  Laterality: N/A;  . THYROIDECTOMY    . TONSILLECTOMY      Current Facility-Administered Medications  Medication Dose Route Frequency Provider Last Rate Last Admin  . 0.9 %  sodium chloride infusion  250 mL Intravenous PRN Sherran Needs, NP      .  acetaminophen (TYLENOL) tablet 650 mg  650 mg Oral BID PRN Sherran Needs, NP      . apixaban Arne Cleveland) tablet 5 mg  5 mg Oral BID Sherran Needs, NP      . Derrill Memo ON 10/20/2020] cholecalciferol (VITAMIN D) tablet 5,000 Units  5,000 Units Oral Daily Sherran Needs, NP      . dofetilide (TIKOSYN) capsule 500 mcg  500 mcg Oral BID Sherran Needs, NP      . Derrill Memo ON 10/20/2020] levothyroxine (SYNTHROID) tablet 112 mcg  112 mcg Oral Q0600 Sherran Needs, NP      . Derrill Memo ON 10/20/2020] loratadine (CLARITIN) tablet 10 mg  10 mg Oral Daily Sherran Needs, NP      . LORazepam (ATIVAN) tablet 1.5 mg  1.5 mg Oral QHS Sherran Needs, NP      . metFORMIN (GLUCOPHAGE-XR) 24 hr tablet 500 mg  500 mg Oral BID WC  Sherran Needs, NP      . metoprolol tartrate (LOPRESSOR) tablet 25 mg  25 mg Oral BID Sherran Needs, NP      . Derrill Memo ON 10/20/2020] pantoprazole (PROTONIX) EC tablet 40 mg  40 mg Oral Daily Sherran Needs, NP      . Derrill Memo ON 10/20/2020] potassium chloride SA (KLOR-CON) CR tablet 20 mEq  20 mEq Oral Daily Sherran Needs, NP      . rosuvastatin (CRESTOR) tablet 10 mg  10 mg Oral QHS Sherran Needs, NP      . Derrill Memo ON 10/20/2020] sertraline (ZOLOFT) tablet 50 mg  50 mg Oral Daily Roderic Palau C, NP      . sodium chloride flush (NS) 0.9 % injection 3 mL  3 mL Intravenous Q12H Roderic Palau C, NP      . sodium chloride flush (NS) 0.9 % injection 3 mL  3 mL Intravenous PRN Sherran Needs, NP      . Derrill Memo ON 10/20/2020] vitamin B-12 (CYANOCOBALAMIN) tablet 1,000 mcg  1,000 mcg Oral Daily Sherran Needs, NP        Allergies  Allergen Reactions  . Penicillins Itching, Rash and Other (See Comments)    Did it involve swelling of the face/tongue/throat, SOB, or low BP? No Did it involve sudden or severe rash/hives, skin peeling, or any reaction on the inside of your mouth or nose? No Did you need to seek medical attention at a hospital or doctor's office? No When did it last happen?30+ years If all above answers are "NO", may proceed with cephalosporin use.     Social History   Socioeconomic History  . Marital status: Divorced    Spouse name: Not on file  . Number of children: 2  . Years of education: Not on file  . Highest education level: Not on file  Occupational History  . Occupation: Retired    Comment: Education officer, museum  Tobacco Use  . Smoking status: Never Smoker  . Smokeless tobacco: Never Used  Vaping Use  . Vaping Use: Never used  Substance and Sexual Activity  . Alcohol use: No    Alcohol/week: 0.0 standard drinks  . Drug use: No  . Sexual activity: Never  Other Topics Concern  . Not on file  Social History Narrative  . Not on file   Social  Determinants of Health   Financial Resource Strain:   . Difficulty of Paying Living Expenses: Not on file  Food Insecurity:   . Worried About  Running Out of Food in the Last Year: Not on file  . Ran Out of Food in the Last Year: Not on file  Transportation Needs:   . Lack of Transportation (Medical): Not on file  . Lack of Transportation (Non-Medical): Not on file  Physical Activity:   . Days of Exercise per Week: Not on file  . Minutes of Exercise per Session: Not on file  Stress:   . Feeling of Stress : Not on file  Social Connections:   . Frequency of Communication with Friends and Family: Not on file  . Frequency of Social Gatherings with Friends and Family: Not on file  . Attends Religious Services: Not on file  . Active Member of Clubs or Organizations: Not on file  . Attends Archivist Meetings: Not on file  . Marital Status: Not on file  Intimate Partner Violence:   . Fear of Current or Ex-Partner: Not on file  . Emotionally Abused: Not on file  . Physically Abused: Not on file  . Sexually Abused: Not on file    Family History  Problem Relation Age of Onset  . Prostate cancer Father   . Diabetes Sister   . Heart disease Sister   . Heart attack Brother   . Heart disease Brother   . Diabetes Brother   . Diabetes Sister   . Diabetes Sister   . Pancreatic cancer Sister   . Diabetes Brother   . Heart disease Brother   . Diabetes Brother   . Heart disease Brother   . Diabetes Brother   . Heart disease Brother   . Diabetes Brother   . Heart disease Brother   . Diabetes Brother   . Heart disease Brother   . Diabetes Brother   . Diabetes Child     ROS- All systems are reviewed and negative except as per the HPI above  Physical Exam: Vitals:   10/19/20 1545  BP: (!) 147/86  Pulse: (!) 129  Resp: 20  Temp: (!) 97.4 F (36.3 C)  TempSrc: Oral  Height: 5' 6.5" (1.689 m)   Wt Readings from Last 3 Encounters:  10/19/20 80.6 kg  08/04/20 79.2 kg   07/07/20 79.4 kg    Labs: Lab Results  Component Value Date   NA 138 10/19/2020   K 4.3 10/19/2020   CL 105 10/19/2020   CO2 25 10/19/2020   GLUCOSE 130 (H) 10/19/2020   BUN 17 10/19/2020   CREATININE 0.78 10/19/2020   CALCIUM 9.3 10/19/2020   MG 1.9 10/19/2020   Lab Results  Component Value Date   INR 1.12 04/20/2016   No results found for: CHOL, HDL, LDLCALC, TRIG  GEN- The patient is well appearing, alert and oriented x 3 today.   Head- normocephalic, atraumatic Eyes-  Sclera clear, conjunctiva pink Ears- hearing intact Oropharynx- clear Neck- supple, no JVP Lymph- no cervical lymphadenopathy Lungs- Clear to ausculation bilaterally, normal work of breathing Heart- irregular rate and rhythm, no murmurs, rubs or gallops, PMI not laterally displaced GI- soft, NT, ND, + BS Extremities- no clubbing, cyanosis, or edema MS- no significant deformity or atrophy Skin- no rash or lesion Psych- euthymic mood, full affect Neuro- strength and sensation are intact  EKG- today appears atrial fibrillation at 113 flutter, QTc ~ 480 (Runs 460-470 usually)  Epic records reviewed   Assessment and Plan: 1. Persistent afib/flutter Multiple cardioversion's in the past  Last cardioversion, 2/17, was successful but  with significant  weakness 2/2 bradycardia  Sinus brady resolved with reduction of BB and amiodarone reduced  back to 200 mg qd  However returned to  afib -> Now she has been off amiodarone for some time.  Initially felt better off amio with rate control, but over past several months reports more fatigue.  Continue metoprolol to 25 (1 1/2 mg)  bid  Will need to follow rates closely in NSR Reviewed EKG with Dr. Rayann Heman and OK to start tikosyn at 500 mcg BID this evening. Will follow QTc closely.  We again discussed the risks, benefits, and monitoring associated with Tikosyn.   2. HTN Follow  3. CHA2DS2VASc score of 6 Continue Eliquis 5 mg bid   Beryle Beams" [place],  [place]  10/19/2020 3:49 PM       I have seen, examined the patient, and reviewed the above assessment and plan.  Changes to above are made where necessary.  On exam, iRRR.  She reports compliance with eliquis without interruption.  We will admit for careful tikosyn administration, monitoring closely for toxicity.  Co Sign: Thompson Grayer, MD 10/19/2020 4:31 PM

## 2020-10-19 NOTE — Progress Notes (Addendum)
Pharmacy: Dofetilide (Tikosyn) - Initial Consult Assessment and Electrolyte Replacement  Pharmacy consulted to assist in monitoring and replacing electrolytes in this 86 y.o. female admitted on 10/19/2020 undergoing dofetilide initiation. First dofetilide dose: 500 mcg PO BID.  Assessment:  Patient Exclusion Criteria: If any screening criteria checked as "Yes", then  patient  should NOT receive dofetilide until criteria item is corrected.  If "Yes" please indicate correction plan.  YES  NO Patient  Exclusion Criteria Correction Plan   [x]   []   Baseline QTc interval is greater than or equal to 440 msec. IF above YES box checked dofetilide contraindicated unless patient has ICD; then may proceed if QTc 500-550 msec or with known ventricular conduction abnormalities may proceed with QTc 550-600 msec. QTc = 480 msec Per Barrington Ellison note, pt's ECG was reviewed with Dr. Rayann Heman and pt approved to start dofetilide this evening   []   [x]   Patient is known or suspected to have a digoxin level greater than 2 ng/ml: No results found for: DIGOXIN     []   [x]   Creatinine clearance less than 20 ml/min (calculated using Cockcroft-Gault, actual body weight and serum creatinine): Estimated Creatinine Clearance: 52.9 mL/min (by C-G formula based on SCr of 0.78 mg/dL).  TBW CrCl = ~63 ml/min    []   [x]  Patient has received drugs known to prolong the QT intervals within the last 48 hours (phenothiazines, tricyclics or tetracyclic antidepressants, erythromycin, H-1 antihistamines, cisapride, fluoroquinolones, azithromycin, ondansetron).   Updated information on QT prolonging agents is available to be searched on the following database:QT prolonging agents  Cetirizine on pt's home med list, but last dose unclear; loratadine substituted for cetirizine on admission (both with minimal risk of QT prolongation)   []   [x]   Patient received a dose of hydrochlorothiazide (Oretic) alone or in any combination  including triamterene (Dyazide, Maxzide) in the last 48 hours.    []   [x]  Patient received a medication known to increase dofetilide plasma concentrations prior to initial dofetilide dose:  . Trimethoprim (Primsol, Proloprim) in the last 36 hours . Verapamil (Calan, Verelan) in the last 36 hours or a sustained release dose in the last 72 hours . Megestrol (Megace) in the last 5 days  . Cimetidine (Tagamet) in the last 6 hours . Ketoconazole (Nizoral) in the last 24 hours . Itraconazole (Sporanox) in the last 48 hours  . Prochlorperazine (Compazine) in the last 36 hours     []   [x]   Patient is known to have a history of torsades de pointes; congenital or acquired long QT syndromes.    []   [x]   Patient has received a Class 1 antiarrhythmic with less than 2 half-lives since last dose. (Disopyramide, Quinidine, Procainamide, Lidocaine, Mexiletine, Flecainide, Propafenone)    []   [x]   Patient has received amiodarone therapy in the past 3 months or amiodarone level is greater than 0.3 ng/ml.    Patient has been appropriately anticoagulated with apixaban.  Labs:    Component Value Date/Time   K 4.3 10/19/2020 1127   MG 1.9 10/19/2020 1127     Plan: Potassium: K >/= 4: Appropriate to initiate Tikosyn, no replacement needed    Magnesium: Mg 1.8-2: Give Mg 2 gm IV x1 to prevent Mg from dropping below 1.8 - do not need to recheck Mg. Appropriate to initiate Tikosyn.  Thank you for allowing pharmacy to participate in this patient's care   Gillermina Hu, PharmD, BCPS, [facility] Clinical Pharmacist 10/19/2020  4:06 PM

## 2020-10-19 NOTE — Progress Notes (Signed)
   10/19/20 1545  Assess: MEWS Score  Temp (!) 97.4 F (36.3 C)  BP (!) 147/86  Pulse Rate (!) 129  ECG Heart Rate (!) 129  Resp 20  Level of Consciousness Alert  SpO2 98 %  O2 Device Room Air  Assess: MEWS Score  MEWS Temp 0  MEWS Systolic 0  MEWS Pulse 2  MEWS RR 0  MEWS LOC 0  MEWS Score 2  MEWS Score Color Yellow  Assess: if the MEWS score is Yellow or Red  Were vital signs taken at a resting state? Yes  Focused Assessment No change from prior assessment  Early Detection of Sepsis Score *See Row Information* Low  MEWS guidelines implemented *See Row Information* Yes  Treat  Pain Scale 0-10  Pain Score 0  Patients Stated Pain Goal 0  Take Vital Signs  Increase Vital Sign Frequency  Yellow: Q 2hr X 2 then Q 4hr X 2, if remains yellow, continue Q 4hrs  Escalate  MEWS: Escalate Yellow: discuss with charge nurse/RN and consider discussing with provider and RRT  Notify: Charge Nurse/RN  Name of Charge Nurse/RN Notified Lisette Abu  Date Charge Nurse/RN Notified 10/19/20  Time Charge Nurse/RN Notified 1545  Document  Patient Outcome Stabilized after interventions (stable; admitted w/ afib for tikosyn load)  Progress note created (see row info) Yes

## 2020-10-19 NOTE — Progress Notes (Signed)
Post tikosyn 2 hour EKG completed and on the pts chart.

## 2020-10-19 NOTE — Plan of Care (Signed)
  Problem: Education: Goal: Knowledge of General Education information will improve Description: Including pain rating scale, medication(s)/side effects and non-pharmacologic comfort measures Outcome: Progressing   Problem: Health Behavior/Discharge Planning: Goal: Ability to manage health-related needs will improve Outcome: Progressing   Problem: Clinical Measurements: Goal: Ability to maintain clinical measurements within normal limits will improve Outcome: Progressing Goal: Will remain free from infection Outcome: Progressing Goal: Diagnostic test results will improve Outcome: Progressing Goal: Respiratory complications will improve Outcome: Progressing Goal: Cardiovascular complication will be avoided Outcome: Progressing   Problem: Education: Goal: Knowledge of disease or condition will improve Outcome: Progressing Goal: Understanding of medication regimen will improve Outcome: Progressing Goal: Individualized Educational Video(s) Outcome: Progressing   Problem: Activity: Goal: Ability to tolerate increased activity will improve Outcome: Progressing   Problem: Cardiac: Goal: Ability to achieve and maintain adequate cardiopulmonary perfusion will improve Outcome: Progressing   Problem: Health Behavior/Discharge Planning: Goal: Ability to safely manage health-related needs after discharge will improve Outcome: Progressing

## 2020-10-20 ENCOUNTER — Encounter (HOSPITAL_COMMUNITY): Payer: Self-pay | Admitting: Internal Medicine

## 2020-10-20 LAB — BASIC METABOLIC PANEL
Anion gap: 10 (ref 5–15)
BUN: 15 mg/dL (ref 8–23)
CO2: 26 mmol/L (ref 22–32)
Calcium: 8.8 mg/dL — ABNORMAL LOW (ref 8.9–10.3)
Chloride: 105 mmol/L (ref 98–111)
Creatinine, Ser: 0.72 mg/dL (ref 0.44–1.00)
GFR, Estimated: >60 mL/min (ref >60)
Glucose, Bld: 114 mg/dL — ABNORMAL HIGH (ref 70–99)
Potassium: 3.7 mmol/L (ref 3.5–5.1)
Sodium: 141 mmol/L (ref 135–145)

## 2020-10-20 LAB — GLUCOSE, CAPILLARY
Glucose-Capillary: 87 mg/dL (ref 70–99)
Glucose-Capillary: 225 mg/dL — ABNORMAL HIGH (ref 70–99)
Glucose-Capillary: 113 mg/dL — ABNORMAL HIGH (ref 70–99)
Glucose-Capillary: 117 mg/dL — ABNORMAL HIGH (ref 70–99)
Glucose-Capillary: 155 mg/dL — ABNORMAL HIGH (ref 70–99)

## 2020-10-20 LAB — MAGNESIUM: Magnesium: 2.2 mg/dL (ref 1.7–2.4)

## 2020-10-20 MED ORDER — POTASSIUM CHLORIDE CRYS ER 20 MEQ PO TBCR
40.0000 meq | EXTENDED_RELEASE_TABLET | Freq: Once | ORAL | Status: AC
  Administered 2020-10-20: 40 meq via ORAL
  Filled 2020-10-20: qty 2

## 2020-10-20 NOTE — H&P (View-Only) (Signed)
Electrophysiology Rounding Note  Patient Name: [name] Date of Encounter: 10/20/2020  Primary Cardiologist: Sherren Mocha, MD  Electrophysiologist: Kahlyn Shippey   Subjective   Pt remains in afib/flutter on Tikosyn 500 mcg BID   QTc from EKG last pm shows stable QTc at ~470 when corrected for irregularity over multiple RR intervals.  The patient is doing well today.  At this time, the patient denies chest pain, shortness of breath, or any new concerns.  Inpatient Medications    Scheduled Meds: . apixaban  5 mg Oral BID  . cholecalciferol  5,000 Units Oral Daily  . dofetilide  500 mcg Oral BID  . levothyroxine  112 mcg Oral Q0600  . loratadine  10 mg Oral Daily  . LORazepam  1.5 mg Oral QHS  . metFORMIN  500 mg Oral BID WC  . metoprolol tartrate  25 mg Oral BID  . pantoprazole  40 mg Oral Daily  . potassium chloride SA  20 mEq Oral Daily  . potassium chloride  40 mEq Oral Once  . rosuvastatin  10 mg Oral QHS  . sertraline  50 mg Oral Daily  . sodium chloride flush  3 mL Intravenous Q12H  . vitamin B-12  1,000 mcg Oral Daily   Continuous Infusions: . sodium chloride     PRN Meds: sodium chloride, acetaminophen, sodium chloride flush   Vital Signs    Vitals:   10/19/20 2138 10/19/20 2218 10/20/20 0000 10/20/20 0637  BP: (!) 178/124 132/72 130/70 134/74  Pulse: 96 94  89  Resp: 18 16  16   Temp: (!) 97.5 F (36.4 C) 97.7 F (36.5 C) 97.7 F (36.5 C) 98.2 F (36.8 C)  TempSrc: Oral Oral Oral Oral  SpO2: 98% 98%  97%  Weight:    78.5 kg  Height:        Intake/Output Summary (Last 24 hours) at 10/20/2020 0748 Last data filed at 10/20/2020 0435 Gross per 24 hour  Intake 995.98 ml  Output 1000 ml  Net -4.02 ml   Filed Weights   10/19/20 1545 10/20/20 0637  Weight: 78.5 kg 78.5 kg    Physical Exam    GEN- The patient is well appearing, alert and oriented x 3 today.   Head- normocephalic, atraumatic Eyes-  Sclera clear, conjunctiva pink Ears-  hearing intact Oropharynx- clear Neck- supple Lungs- Clear to ausculation bilaterally, normal work of breathing Heart- Irregularly irregular rate and rhythm, no murmurs, rubs or gallops GI- soft, NT, ND, + BS Extremities- no clubbing, cyanosis, or edema Skin- no rash or lesion Psych- euthymic mood, full affect Neuro- strength and sensation are intact  Labs    CBC No results for input(s): WBC, NEUTROABS, HGB, HCT, MCV, PLT in the last 72 hours. Basic Metabolic Panel Recent Labs    10/19/20 1127 10/20/20 0354  NA 138 141  K 4.3 3.7  CL 105 105  CO2 25 26  GLUCOSE 130* 114*  BUN 17 15  CREATININE 0.78 0.72  CALCIUM 9.3 8.8*  MG 1.9 2.2    Potassium  Date/Time Value Ref Range Status  10/20/2020 03:54 AM 3.7 3.5 - 5.1 mmol/L Final   Magnesium  Date/Time Value Ref Range Status  10/20/2020 03:54 AM 2.2 1.7 - 2.4 mg/dL Final    Comment:    Performed at [facility], [place] [address], [place], [place] [address]    Telemetry    Atrial flutter 90-110s (personally reviewed)  Radiology    No results found.  Patient Profile     [name] is a 86 y.o. female with a past medical history significant for persistent atrial fibrillation.  They were admitted for tikosyn load.   Assessment & Plan    1. Persistent atrial fibrillation Pt remains in afib/flutter on Tikosyn 500 mcg BID  Continue Eliquis Electrolytes stable.  CHA2DS2VASC is at least 6.  2. HTN Follow  If pt does not convert chemically, plan on DCCV tomorrow    For questions or updates, please contact [place] Please consult [email or web address] for contact info under Cardiology/STEMI.  Signed, Shirley Friar, PA-C  10/20/2020, 7:48 AM    Qt appears stable.  Continue tikosyn load.

## 2020-10-20 NOTE — TOC Benefit Eligibility Note (Signed)
Transition of Care [facility]) Benefit Eligibility Note    Patient Details  Name: [name] MRN: [id number] Date of Birth: 03-10-1931   Medication/Dose: Phyllis Ginger 500 MCG BID  250 MCG BID  125 MCG BID  Covered?: Yes  Tier:  (4 DRUG)  Prescription Coverage Preferred Pharmacy: [name] with Person/Company/Phone Number:: [name]    @  [employer] RX #  [phone]  Co-Pay: $100.00   FOR EACH PRESCRIPTION  Prior Approval: Yes [id number])  Deductible:  (PT'S IN COVERAGE GAP)  Additional Notes: DOFETILIDE  500 MCG BID  250 MCG BID  125 MCG BID : COVER- YES   CO-PAY- $10.00 FOR EACH PRESCRIPTION  TIER-1 DRUG  P/A- Odetta Pink Phone Number: 10/20/2020, 3:20 PM

## 2020-10-20 NOTE — Progress Notes (Signed)
  Morning EKG reviewed  Shows pt remains in afib at 92 bpm with borderline QTc at ~480-490 ms.  QT 400-420. When 4-5 consecutive R-R intervals are used, patients QTc remains in stable range.    Continue Tikosyn 500 mcg BID. Continue to follow closely for toxicity.  Pt will be NPO after midnight for DCCV if remains in [place], [place]  Pager: [phone]  10/20/2020 1:05 PM

## 2020-10-20 NOTE — Progress Notes (Signed)
Pharmacy: Dofetilide (Tikosyn) - Follow Up Assessment and Electrolyte Replacement  Pharmacy consulted to assist in monitoring and replacing electrolytes in this 86 y.o. female admitted on 10/19/2020 undergoing dofetilide initiation. First dofetilide dose: 10/19/20.  Labs:    Component Value Date/Time   K 3.7 10/20/2020 0354   MG 2.2 10/20/2020 0354     Plan: Potassium: K 3.5-3.7:  Give KCl 60 mEq po x1 >> 40 mEq per EP  Magnesium: Mg > 2: No additional supplementation needed    Thank you for allowing pharmacy to participate in this patient's care   Arrie Senate, PharmD, BCPS, [facility] Clinical Pharmacist [phone] Please check AMION for all [place] numbers 10/20/2020

## 2020-10-20 NOTE — Progress Notes (Addendum)
Electrophysiology Rounding Note  Patient Name: [name] Date of Encounter: 10/20/2020  Primary Cardiologist: Sherren Mocha, MD  Electrophysiologist: Trelon Plush   Subjective   Pt remains in afib/flutter on Tikosyn 500 mcg BID   QTc from EKG last pm shows stable QTc at ~470 when corrected for irregularity over multiple RR intervals.  The patient is doing well today.  At this time, the patient denies chest pain, shortness of breath, or any new concerns.  Inpatient Medications    Scheduled Meds: . apixaban  5 mg Oral BID  . cholecalciferol  5,000 Units Oral Daily  . dofetilide  500 mcg Oral BID  . levothyroxine  112 mcg Oral Q0600  . loratadine  10 mg Oral Daily  . LORazepam  1.5 mg Oral QHS  . metFORMIN  500 mg Oral BID WC  . metoprolol tartrate  25 mg Oral BID  . pantoprazole  40 mg Oral Daily  . potassium chloride SA  20 mEq Oral Daily  . potassium chloride  40 mEq Oral Once  . rosuvastatin  10 mg Oral QHS  . sertraline  50 mg Oral Daily  . sodium chloride flush  3 mL Intravenous Q12H  . vitamin B-12  1,000 mcg Oral Daily   Continuous Infusions: . sodium chloride     PRN Meds: sodium chloride, acetaminophen, sodium chloride flush   Vital Signs    Vitals:   10/19/20 2138 10/19/20 2218 10/20/20 0000 10/20/20 0637  BP: (!) 178/124 132/72 130/70 134/74  Pulse: 96 94  89  Resp: 18 16  16   Temp: (!) 97.5 F (36.4 C) 97.7 F (36.5 C) 97.7 F (36.5 C) 98.2 F (36.8 C)  TempSrc: Oral Oral Oral Oral  SpO2: 98% 98%  97%  Weight:    78.5 kg  Height:        Intake/Output Summary (Last 24 hours) at 10/20/2020 0748 Last data filed at 10/20/2020 0435 Gross per 24 hour  Intake 995.98 ml  Output 1000 ml  Net -4.02 ml   Filed Weights   10/19/20 1545 10/20/20 0637  Weight: 78.5 kg 78.5 kg    Physical Exam    GEN- The patient is well appearing, alert and oriented x 3 today.   Head- normocephalic, atraumatic Eyes-  Sclera clear, conjunctiva pink Ears-  hearing intact Oropharynx- clear Neck- supple Lungs- Clear to ausculation bilaterally, normal work of breathing Heart- Irregularly irregular rate and rhythm, no murmurs, rubs or gallops GI- soft, NT, ND, + BS Extremities- no clubbing, cyanosis, or edema Skin- no rash or lesion Psych- euthymic mood, full affect Neuro- strength and sensation are intact  Labs    CBC No results for input(s): WBC, NEUTROABS, HGB, HCT, MCV, PLT in the last 72 hours. Basic Metabolic Panel Recent Labs    10/19/20 1127 10/20/20 0354  NA 138 141  K 4.3 3.7  CL 105 105  CO2 25 26  GLUCOSE 130* 114*  BUN 17 15  CREATININE 0.78 0.72  CALCIUM 9.3 8.8*  MG 1.9 2.2    Potassium  Date/Time Value Ref Range Status  10/20/2020 03:54 AM 3.7 3.5 - 5.1 mmol/L Final   Magnesium  Date/Time Value Ref Range Status  10/20/2020 03:54 AM 2.2 1.7 - 2.4 mg/dL Final    Comment:    Performed at [facility], [place] [address] [place], [place] [address]    Telemetry    Atrial flutter 90-110s (personally reviewed)  Radiology    No results found.  Patient Profile     [name] is a 86 y.o. female with a past medical history significant for persistent atrial fibrillation.  They were admitted for tikosyn load.   Assessment & Plan    1. Persistent atrial fibrillation Pt remains in afib/flutter on Tikosyn 500 mcg BID  Continue Eliquis Electrolytes stable.  CHA2DS2VASC is at least 6.  2. HTN Follow  If pt does not convert chemically, plan on DCCV tomorrow    For questions or updates, please contact [place] Please consult [email or web address] for contact info under Cardiology/STEMI.  Signed, Shirley Friar, PA-C  10/20/2020, 7:48 AM    Qt appears stable.  Continue tikosyn load.

## 2020-10-20 NOTE — Care Management (Signed)
1128 10-20-20 Pt presented for Tikosyn load. Benefits check submitted for Tikosyn. Case Manager will follow for cost, make the patient aware of cost and verify pharmacy of choice. Bethena Roys, RN,BSN Case Manager

## 2020-10-21 ENCOUNTER — Inpatient Hospital Stay (HOSPITAL_COMMUNITY): Payer: Medicare PPO | Admitting: Anesthesiology

## 2020-10-21 ENCOUNTER — Encounter (HOSPITAL_COMMUNITY): Payer: Self-pay | Admitting: Internal Medicine

## 2020-10-21 ENCOUNTER — Encounter (HOSPITAL_COMMUNITY)
Admission: AD | Disposition: A | Discharge status: Home / Self Care | Payer: Self-pay | Source: Ambulatory Visit | Attending: Internal Medicine

## 2020-10-21 ENCOUNTER — Other Ambulatory Visit: Payer: Self-pay

## 2020-10-21 LAB — BASIC METABOLIC PANEL
Anion gap: 11 (ref 5–15)
BUN: 11 mg/dL (ref 8–23)
CO2: 25 mmol/L (ref 22–32)
Calcium: 10.3 mg/dL (ref 8.9–10.3)
Chloride: 105 mmol/L (ref 98–111)
Creatinine, Ser: 0.61 mg/dL (ref 0.44–1.00)
GFR, Estimated: >60 mL/min (ref >60)
Glucose, Bld: 112 mg/dL — ABNORMAL HIGH (ref 70–99)
Potassium: 4.5 mmol/L (ref 3.5–5.1)
Sodium: 141 mmol/L (ref 135–145)

## 2020-10-21 LAB — GLUCOSE, CAPILLARY
Glucose-Capillary: 109 mg/dL — ABNORMAL HIGH (ref 70–99)
Glucose-Capillary: 121 mg/dL — ABNORMAL HIGH (ref 70–99)
Glucose-Capillary: 128 mg/dL — ABNORMAL HIGH (ref 70–99)

## 2020-10-21 LAB — MAGNESIUM: Magnesium: 2.1 mg/dL (ref 1.7–2.4)

## 2020-10-21 LAB — PROTIME-INR
INR: 1.2 (ref 0.8–1.2)
Prothrombin Time: 15.1 seconds (ref 11.4–15.2)

## 2020-10-21 SURGERY — CARDIOVERSION
Anesthesia: General

## 2020-10-21 MED ORDER — SODIUM CHLORIDE 0.9 % IV SOLN: Freq: Once | INTRAVENOUS | Status: AC

## 2020-10-21 MED ORDER — PROPOFOL 10 MG/ML IV BOLUS
INTRAVENOUS | Status: DC | PRN
  Administered 2020-10-21: 50 mg via INTRAVENOUS

## 2020-10-21 MED ORDER — ACETAMINOPHEN 500 MG PO TABS
1000.0000 mg | ORAL_TABLET | Freq: Three times a day (TID) | ORAL | Status: DC | PRN
  Administered 2020-10-21 – 2020-10-23 (×7): 1000 mg via ORAL
  Filled 2020-10-21 (×7): qty 2

## 2020-10-21 MED ORDER — LIDOCAINE HCL (CARDIAC) PF 100 MG/5ML IV SOSY
PREFILLED_SYRINGE | INTRAVENOUS | Status: DC | PRN
  Administered 2020-10-21: 80 mg via INTRAVENOUS

## 2020-10-21 MED ORDER — LACTATED RINGERS IV SOLN: INTRAVENOUS | Status: DC | PRN

## 2020-10-21 NOTE — Anesthesia Procedure Notes (Signed)
Procedure Name: General with mask airway Date/Time: 10/21/2020 12:52 PM Performed by: Colin Benton, CRNA Pre-anesthesia Checklist: Patient identified, Emergency Drugs available, Suction available and Patient being monitored Patient Re-evaluated:Patient Re-evaluated prior to induction Preoxygenation: Pre-oxygenation with 100% oxygen Induction Type: IV induction Ventilation: Mask ventilation without difficulty Placement Confirmation: positive ETCO2 Dental Injury: Teeth and Oropharynx as per pre-operative assessment

## 2020-10-21 NOTE — Interval H&P Note (Signed)
History and Physical Interval Note:  10/21/2020 12:39 PM  [name]  has presented today for surgery, with the diagnosis of afib.  The various methods of treatment have been discussed with the patient and family. After consideration of risks, benefits and other options for treatment, the patient has consented to  Procedure(s): CARDIOVERSION (N/A) as a surgical intervention.  The patient's history has been reviewed, patient examined, no change in status, stable for surgery.  I have reviewed the patient's chart and labs.  Questions were answered to the patient's satisfaction.     [name]

## 2020-10-21 NOTE — Transfer of Care (Signed)
Immediate Anesthesia Transfer of Care Note  Patient: [name]  Procedure(s) Performed: CARDIOVERSION (N/A )  Patient Location: Endoscopy Unit  Anesthesia Type:General  Level of Consciousness: drowsy  Airway & Oxygen Therapy: Patient Spontanous Breathing  Post-op Assessment: Report given to RN and Post -op Vital signs reviewed and stable  Post vital signs: Reviewed and stable  Last Vitals:  Vitals Value Taken Time  BP    Temp    Pulse    Resp    SpO2      Last Pain:  Vitals:   10/21/20 1157  TempSrc: Oral  PainSc: 0-No pain      Patients Stated Pain Goal: 0 (75/64/33 2951)  Complications: No complications documented.

## 2020-10-21 NOTE — Care Management (Signed)
1446 10-21-20 Case Manager spoke with patient and she is aware of her co pay and agreeable to the amount. She would like her first dose filled via [facility] for 30 day supply and then Rx for refills sent to [facility] for 90 day supply. No further needs from Case Manager at this time. Bethena Roys, RN,BSN Case Manager

## 2020-10-21 NOTE — Progress Notes (Signed)
Morning EKG reviewed (Post [facility])  Shows has converted to NSR at 57 bpm with stable QTc at ~460 ms.  Continue Tikosyn 500 mcg BID.   Plan for d/c tomorrow if QTc remains stable.    Shirley Friar, PA-C  Pager: [phone]  10/21/2020 2:06 PM

## 2020-10-21 NOTE — Progress Notes (Signed)
Electrophysiology Rounding Note  Patient Name: [name] Date of Encounter: 10/21/2020  Primary Cardiologist: Sherren Mocha, MD  Electrophysiologist: Dr. Rayann Heman   Subjective   Pt remains in afib on Tikosyn 500 mcg BID   QTc from EKG last pm shows borderline at ~490 ms on average.  The patient is doing well today.  At this time, the patient denies chest pain, shortness of breath, or any new concerns.  Inpatient Medications    Scheduled Meds: . apixaban  5 mg Oral BID  . cholecalciferol  5,000 Units Oral Daily  . dofetilide  500 mcg Oral BID  . levothyroxine  112 mcg Oral Q0600  . loratadine  10 mg Oral Daily  . LORazepam  1.5 mg Oral QHS  . metFORMIN  500 mg Oral BID WC  . metoprolol tartrate  25 mg Oral BID  . pantoprazole  40 mg Oral Daily  . potassium chloride SA  20 mEq Oral Daily  . rosuvastatin  10 mg Oral QHS  . sertraline  50 mg Oral Daily  . sodium chloride flush  3 mL Intravenous Q12H  . vitamin B-12  1,000 mcg Oral Daily   Continuous Infusions: . sodium chloride     PRN Meds: sodium chloride, acetaminophen, sodium chloride flush   Vital Signs    Vitals:   10/21/20 0000 10/21/20 0300 10/21/20 0500 10/21/20 0600  BP: 139/72 136/76 139/78 136/74  Pulse: 88 84 86 84  Resp: 18 16 16 16   Temp: 97.8 F (36.6 C) 97.8 F (36.6 C) 97.8 F (36.6 C) 97.8 F (36.6 C)  TempSrc: Oral Oral Oral Oral  SpO2: 98% 97% 98% 99%  Weight:      Height:        Intake/Output Summary (Last 24 hours) at 10/21/2020 0826 Last data filed at 10/21/2020 9833 Gross per 24 hour  Intake 560 ml  Output 700 ml  Net -140 ml   Filed Weights   10/19/20 1545 10/20/20 0637  Weight: 78.5 kg 78.5 kg    Physical Exam    GEN- The patient is well appearing, alert and oriented x 3 today.   Head- normocephalic, atraumatic Eyes-  Sclera clear, conjunctiva pink Ears- hearing intact Oropharynx- clear Neck- supple Lungs- Clear to ausculation bilaterally, normal work of  breathing Heart- Irregularly irregular rate and rhythm, no murmurs, rubs or gallops GI- soft, NT, ND, + BS Extremities- no clubbing, cyanosis, or edema Skin- no rash or lesion Psych- euthymic mood, full affect Neuro- strength and sensation are intact  Labs    CBC No results for input(s): WBC, NEUTROABS, HGB, HCT, MCV, PLT in the last 72 hours. Basic Metabolic Panel Recent Labs    10/20/20 0354 10/21/20 0227  NA 141 141  K 3.7 4.5  CL 105 105  CO2 26 25  GLUCOSE 114* 112*  BUN 15 11  CREATININE 0.72 0.61  CALCIUM 8.8* 10.3  MG 2.2 2.1    Potassium  Date/Time Value Ref Range Status  10/21/2020 02:27 AM 4.5 3.5 - 5.1 mmol/L Final   Magnesium  Date/Time Value Ref Range Status  10/21/2020 02:27 AM 2.1 1.7 - 2.4 mg/dL Final    Comment:    Performed at [facility], [place] [address], [place], [place] [address]    Telemetry    AF 80-110s (personally reviewed)  Radiology    No results found.   Patient Profile     [name] is a 86 y.o. female with a past  medical history significant for persistent atrial fibrillation.  They were admitted for tikosyn load.   Assessment & Plan    1. Persistent atrial fibrillation Pt remains in afib on Tikosyn 500 mcg BID  Continue Eliquis Electrolytes stable.  CHA2DS2VASC is at least 6. QTc borderline. Will follow closely in sinus for determination of dose.    If pt does not convert chemically, plan on DCCV this afternoon.     For questions or updates, please contact [place] Please consult [email or web address] for contact info under Cardiology/STEMI.  Signed, Shirley Friar, PA-C  10/21/2020, 8:26 AM

## 2020-10-21 NOTE — CV Procedure (Signed)
Procedure: Electrical Cardioversion Indications:  Atrial Fibrillation  Procedure Details:  Consent: Risks of procedure as well as the alternatives and risks of each were explained to the (patient/caregiver).  Consent for procedure obtained.  Time Out: Verified patient identification, verified procedure, site/side was marked, verified correct patient position, special equipment/implants available, medications/allergies/relevent history reviewed, required imaging and test results available. PERFORMED.  Patient placed on cardiac monitor, pulse oximetry, supplemental oxygen as necessary.  Sedation given: propofol and lidocaine per anesthesia Pacer pads placed anterior and posterior chest.  Cardioverted 1 time(s).  Cardioversion with synchronized biphasic 120J shock.  Evaluation: Findings: Post procedure EKG shows: NSR - sinus bradycardia Complications: None Patient did tolerate procedure well.  Time Spent Directly with the Patient:  15 minutes   [name] 10/21/2020, 12:56 PM

## 2020-10-21 NOTE — Progress Notes (Signed)
Pharmacy: Dofetilide (Tikosyn) - Follow Up Assessment and Electrolyte Replacement  Pharmacy consulted to assist in monitoring and replacing electrolytes in this 86 y.o. female admitted on 10/19/2020 undergoing dofetilide initiation. First dofetilide dose: 10/19/20.  Labs:    Component Value Date/Time   K 4.5 10/21/2020 0227   MG 2.1 10/21/2020 0227     Plan: Potassium: K >/= 4: No additional supplementation needed  Magnesium: Mg > 2: No additional supplementation needed    Thank you for allowing pharmacy to participate in this patient's care   Arrie Senate, PharmD, BCPS, [facility] Clinical Pharmacist [phone] Please check AMION for all [place] numbers 10/21/2020

## 2020-10-21 NOTE — Plan of Care (Signed)
  Problem: Education: Goal: Knowledge of General Education information will improve Description: Including pain rating scale, medication(s)/side effects and non-pharmacologic comfort measures Outcome: Progressing   Problem: Health Behavior/Discharge Planning: Goal: Ability to manage health-related needs will improve Outcome: Progressing   Problem: Clinical Measurements: Goal: Ability to maintain clinical measurements within normal limits will improve Outcome: Progressing Goal: Will remain free from infection Outcome: Progressing Goal: Diagnostic test results will improve Outcome: Progressing Goal: Respiratory complications will improve Outcome: Progressing Goal: Cardiovascular complication will be avoided Outcome: Progressing   Problem: Education: Goal: Knowledge of disease or condition will improve Outcome: Progressing Goal: Understanding of medication regimen will improve Outcome: Progressing Goal: Individualized Educational Video(s) Outcome: Progressing   Problem: Activity: Goal: Ability to tolerate increased activity will improve Outcome: Progressing   Problem: Cardiac: Goal: Ability to achieve and maintain adequate cardiopulmonary perfusion will improve Outcome: Progressing   Problem: Health Behavior/Discharge Planning: Goal: Ability to safely manage health-related needs after discharge will improve Outcome: Progressing

## 2020-10-21 NOTE — Anesthesia Postprocedure Evaluation (Signed)
Anesthesia Post Note  Patient: [name]  Procedure(s) Performed: CARDIOVERSION (N/A )     Patient location during evaluation: PACU Anesthesia Type: General Level of consciousness: awake and alert Pain management: pain level controlled Vital Signs Assessment: post-procedure vital signs reviewed and stable Respiratory status: spontaneous breathing, nonlabored ventilation and respiratory function stable Cardiovascular status: blood pressure returned to baseline and stable Postop Assessment: no apparent nausea or vomiting Anesthetic complications: no   No complications documented.  Last Vitals:  Vitals:   10/21/20 1320 10/21/20 1344  BP: (!) 151/85 (!) 153/88  Pulse: (!) 58 (!) 59  Resp: (!) 31 20  Temp:  36.6 C  SpO2: 96% 98%    Last Pain:  Vitals:   10/21/20 1344  TempSrc: Oral  PainSc: 0-No pain                 Merlinda [place]

## 2020-10-21 NOTE — Anesthesia Preprocedure Evaluation (Addendum)
Anesthesia Evaluation  Patient identified by MRN, date of birth, ID band Patient awake    Reviewed: Allergy & Precautions, H&P , NPO status , Patient's Chart, lab work & pertinent test results  Airway Mallampati: III  TM Distance: >3 FB Neck ROM: Full    Dental no notable dental hx. (+) Teeth Intact, Dental Advisory Given   Pulmonary sleep apnea ,    Pulmonary exam normal breath sounds clear to auscultation       Cardiovascular hypertension, Pt. on medications and Pt. on home beta blockers + dysrhythmias Atrial Fibrillation  Rhythm:Irregular Rate:Normal    1. Left ventricular ejection fraction, by estimation, is 70 to 75%. The  left ventricle has hyperdynamic function. The left ventricle has no  regional wall motion abnormalities. There is moderate left ventricular  hypertrophy. Left ventricular diastolic  parameters are consistent with Grade I diastolic dysfunction (impaired  relaxation).  2. Right ventricular systolic function is normal. The right ventricular  size is normal. There is mildly elevated pulmonary artery systolic  pressure.  3. Left atrial size was moderately dilated.  4. Right atrial size was mildly dilated.  5. The mitral valve is grossly normal. Mild mitral valve regurgitation.  No evidence of mitral stenosis. Severe mitral annular calcification.  6. The aortic valve is tricuspid. There is moderate calcification of the  aortic valve. There is moderate thickening of the aortic valve. Aortic  valve regurgitation is trivial. No aortic stenosis is present. Aortic  regurgitation PHT measures 474 msec.  7. The inferior vena cava is normal in size with greater than 50%  respiratory variability, suggesting right atrial pressure of 3 mmHg.    Neuro/Psych  Headaches, Anxiety    GI/Hepatic Neg liver ROS, hiatal hernia, GERD  Medicated and Controlled,  Endo/Other  diabetes, Type 2, Oral Hypoglycemic  AgentsHypothyroidism   Renal/GU negative Renal ROS  negative genitourinary   Musculoskeletal  (+) Arthritis , Osteoarthritis,    Abdominal   Peds  Hematology  (+) Blood dyscrasia, anemia ,   Anesthesia Other Findings   Reproductive/Obstetrics negative OB ROS                            Anesthesia Physical  Anesthesia Plan  ASA: III  Anesthesia Plan: General   Post-op Pain Management:    Induction: Intravenous  PONV Risk Score and Plan: 3 and Propofol infusion and Treatment may vary due to age or medical condition  Airway Management Planned: Mask  Additional Equipment:   Intra-op Plan:   Post-operative Plan:   Informed Consent: I have reviewed the patients History and Physical, chart, labs and discussed the procedure including the risks, benefits and alternatives for the proposed anesthesia with the patient or authorized representative who has indicated his/her understanding and acceptance.     Dental advisory given  Plan Discussed with: CRNA  Anesthesia Plan Comments:         Anesthesia Quick Evaluation

## 2020-10-22 LAB — BASIC METABOLIC PANEL
Anion gap: 8 (ref 5–15)
BUN: 16 mg/dL (ref 8–23)
CO2: 24 mmol/L (ref 22–32)
Calcium: 8.6 mg/dL — ABNORMAL LOW (ref 8.9–10.3)
Chloride: 104 mmol/L (ref 98–111)
Creatinine, Ser: 0.7 mg/dL (ref 0.44–1.00)
GFR, Estimated: >60 mL/min (ref >60)
Glucose, Bld: 154 mg/dL — ABNORMAL HIGH (ref 70–99)
Potassium: 3.7 mmol/L (ref 3.5–5.1)
Sodium: 136 mmol/L (ref 135–145)

## 2020-10-22 LAB — GLUCOSE, CAPILLARY
Glucose-Capillary: 227 mg/dL — ABNORMAL HIGH (ref 70–99)
Glucose-Capillary: 92 mg/dL (ref 70–99)
Glucose-Capillary: 112 mg/dL — ABNORMAL HIGH (ref 70–99)

## 2020-10-22 LAB — MAGNESIUM: Magnesium: 1.8 mg/dL (ref 1.7–2.4)

## 2020-10-22 MED ORDER — MAGNESIUM OXIDE 400 (241.3 MG) MG PO TABS
400.0000 mg | ORAL_TABLET | Freq: Every day | ORAL | Status: DC
  Administered 2020-10-23 – 2020-10-24 (×2): 400 mg via ORAL
  Filled 2020-10-22 (×2): qty 1

## 2020-10-22 MED ORDER — TRAMADOL HCL 50 MG PO TABS
50.0000 mg | ORAL_TABLET | Freq: Once | ORAL | Status: AC
  Administered 2020-10-22: 50 mg via ORAL
  Filled 2020-10-22: qty 1

## 2020-10-22 MED ORDER — MAGNESIUM SULFATE 2 GM/50ML IV SOLN
2.0000 g | Freq: Once | INTRAVENOUS | Status: AC
  Administered 2020-10-22: 2 g via INTRAVENOUS
  Filled 2020-10-22: qty 50

## 2020-10-22 MED ORDER — POTASSIUM CHLORIDE CRYS ER 20 MEQ PO TBCR
40.0000 meq | EXTENDED_RELEASE_TABLET | Freq: Once | ORAL | Status: AC
  Administered 2020-10-22: 40 meq via ORAL
  Filled 2020-10-22: qty 2

## 2020-10-22 MED ORDER — DOFETILIDE 250 MCG PO CAPS
250.0000 ug | ORAL_CAPSULE | Freq: Two times a day (BID) | ORAL | Status: DC
  Administered 2020-10-22 – 2020-10-23 (×2): 250 ug via ORAL
  Filled 2020-10-22 (×2): qty 1

## 2020-10-22 NOTE — Care Management (Signed)
10-22-20 1630 Case Manager spoke with Physical Therapy to see if they can see the patient on Saturday. Per therapist, the patient will be added to the list to see how she is progressing. Case Manager will continue to follow for additional transition of care needs. Bethena Roys, RN,BSN Case Manager

## 2020-10-22 NOTE — Progress Notes (Addendum)
Evening EKG reviewed  Shows remains in NSR at 68 bpm with stable QTc at ~445 ms.  Continue Tikosyn 500 mcg BID.   NSVT noted (PVCs preceded tikosyn dosing). Short run of SVT also noted.   K 3.7 Mg. 1.8. Supp ordered for both.   Chronic knee pain. Refuses PT consideration.   Plan for discharge today if remains stable.    Shirley Friar, PA-C  Pager: [phone]  10/22/2020 8:22 AM

## 2020-10-22 NOTE — Discharge Summary (Incomplete)
ELECTROPHYSIOLOGY PROCEDURE DISCHARGE SUMMARY    Patient ID: [name],  MRN: [id number], DOB/AGE: 1931/01/08 86 y.o.  Admit date: 10/19/2020 Discharge date: ***  Primary Care Physician: Lavone Orn, MD  Primary Cardiologist: Sherren Mocha, MD  Electrophysiologist: Dr. Rayann Heman  Primary Discharge Diagnosis:  1.  Persistent atrial fibrillation status post Tikosyn loading this admission  Secondary Discharge Diagnosis:  2. Chronic knee pain  Allergies  Allergen Reactions  . Penicillins Itching, Rash and Other (See Comments)    Did it involve swelling of the face/tongue/throat, SOB, or low BP? No Did it involve sudden or severe rash/hives, skin peeling, or any reaction on the inside of your mouth or nose? No Did you need to seek medical attention at a hospital or doctor's office? No When did it last happen?30+ years If all above answers are "NO", may proceed with cephalosporin use.      Procedures This Admission:  1.  Tikosyn loading 2.  Direct current cardioversion on Thursday October 21, 2020 by Dr Margaretann Loveless which successfully restored SR.  There were no early apparent complications.   Brief HPI: [name] is a 86 y.o. female with a past medical history as noted above.  They were referred to EP in the outpatient setting for treatment options of atrial fibrillation.  Risks, benefits, and alternatives to Tikosyn were reviewed with the patient who wished to proceed.    Hospital Course:  The patient was admitted and Tikosyn was initiated.  Renal function and electrolytes were followed during the hospitalization.   On 10/21/2020 they underwent direct current cardioversion which restored sinus rhythm.  They were monitored until discharge on telemetry which demonstrated NSR, PVCs which preceded tikosyn initiation, short run of atrial tachycardia, one episode NSVT.   Pt QTc extended on am of 12/10, and Tikosyn was decreased to 250 mcg BID. ***   On the day of  discharge, they were examined by ***  who considered them stable for discharge to home.  Follow-up has been arranged with the Atrial Fibrillation clinic in approximately 1 week and with EP APP or Dr. Rayann Heman  in 4 weeks.   Physical Exam: Vitals:   10/21/20 1542 10/21/20 1944 10/22/20 0501 10/22/20 0814  BP: (!) 156/86  (!) 157/82 (!) 146/68  Pulse: (!) 57 66 69 69  Resp: 20 (!) 25 19 20   Temp: 98.4 F (36.9 C) 99.1 F (37.3 C) 98.9 F (37.2 C) 98.1 F (36.7 C)  TempSrc: Oral Oral Oral Oral  SpO2: 96%  94% 98%  Weight:   79.4 kg   Height:        GEN- The patient is well appearing, alert and oriented x 3 today.   HEENT: normocephalic, atraumatic; sclera clear, conjunctiva pink; hearing intact; oropharynx clear; neck supple, no JVP Lymph- no cervical lymphadenopathy Lungs- Clear to ausculation bilaterally, normal work of breathing.  No wheezes, rales, rhonchi Heart- Regular rate and rhythm, no murmurs, rubs or gallops, PMI not laterally displaced GI- soft, non-tender, non-distended, bowel sounds present, no hepatosplenomegaly Extremities- no clubbing, cyanosis, or edema; DP/PT/radial pulses 2+ bilaterally MS- no significant deformity or atrophy Skin- warm and dry, no rash or lesion Psych- euthymic mood, full affect Neuro- strength and sensation are intact   Labs:   Lab Results  Component Value Date   WBC 4.6 12/03/2019   HGB 13.8 12/03/2019   HCT 42.0 12/03/2019   MCV 98.6 12/03/2019   PLT 166 12/03/2019    Recent Labs  Lab 10/22/20  0035  NA 136  K 3.7  CL 104  CO2 24  BUN 16  CREATININE 0.70  CALCIUM 8.6*  GLUCOSE 154*     Discharge Medications:  Allergies as of 10/22/2020      Reactions   Penicillins Itching, Rash, Other (See Comments)   Did it involve swelling of the face/tongue/throat, SOB, or low BP? No Did it involve sudden or severe rash/hives, skin peeling, or any reaction on the inside of your mouth or nose? No Did you need to seek medical attention  at a hospital or doctor's office? No When did it last happen?30+ years If all above answers are "NO", may proceed with cephalosporin use.    Med Rec must be completed prior to using this [place]***       Disposition:    Follow-up Information    Thompson Grayer, MD Follow up on 12/03/2020.   Specialty: Cardiology Why: at 78 for post tikosyn 1 month follow up Contact information: [place] [id number] [phone]        [place] Follow up on 10/29/2020.   Specialty: Cardiology Why: at 0900 for post hospital tikosyn check Contact information: [address] [id number] [place] [phone]              Duration of Discharge Encounter: Greater than 30 minutes including physician time.  Jacalyn Lefevre, PA-C  10/22/2020 10:23 AM

## 2020-10-22 NOTE — Progress Notes (Signed)
If patient is able to discharge 10/23/20, confirmed with Kristopher Oppenheim at [facility] that they currently (as of 18:35 12/10) have enough tikosyn 250 mcg tablets to provide a months supply.  Legrand Como [address] [place], [place]

## 2020-10-22 NOTE — Progress Notes (Signed)
Patient unable to get out of bed, having right knee pain. Patient received 1000 mg acetaminophen PRN. Patient refusing to take anything else for pain. Patient lives alone, probable discharge today. PT eval order in.

## 2020-10-22 NOTE — Progress Notes (Addendum)
Electrophysiology Rounding Note  Patient Name: [name] Date of Encounter: 10/22/2020  Primary Cardiologist: Sherren Mocha, MD  Electrophysiologist: Dr. Rayann Heman  Subjective   Pt remains in NSR on Tikosyn 500 mcg BID   QTc this am shows sinus bradycardia at 54 bpm with prolonged QTc at ~510-520 depending on lead and QRS used.  The patient is doing well today.  At this time, the patient denies chest pain, shortness of breath, or any new concerns.  Inpatient Medications    Scheduled Meds: . apixaban  5 mg Oral BID  . cholecalciferol  5,000 Units Oral Daily  . dofetilide  250 mcg Oral BID  . levothyroxine  112 mcg Oral Q0600  . loratadine  10 mg Oral Daily  . LORazepam  1.5 mg Oral QHS  . [START ON 10/23/2020] magnesium oxide  400 mg Oral Daily  . metFORMIN  500 mg Oral BID WC  . metoprolol tartrate  25 mg Oral BID  . pantoprazole  40 mg Oral Daily  . potassium chloride SA  20 mEq Oral Daily  . rosuvastatin  10 mg Oral QHS  . sertraline  50 mg Oral Daily  . sodium chloride flush  3 mL Intravenous Q12H  . vitamin B-12  1,000 mcg Oral Daily   Continuous Infusions: . sodium chloride     PRN Meds: sodium chloride, acetaminophen, sodium chloride flush   Vital Signs    Vitals:   10/21/20 1944 10/22/20 0501 10/22/20 0814 10/22/20 1138  BP:  (!) 157/82 (!) 146/68 (!) 147/84  Pulse: 66 69 69 61  Resp: (!) 25 19 20 20   Temp: 99.1 F (37.3 C) 98.9 F (37.2 C) 98.1 F (36.7 C) 97.8 F (36.6 C)  TempSrc: Oral Oral Oral Oral  SpO2:  94% 98%   Weight:  79.4 kg    Height:        Intake/Output Summary (Last 24 hours) at 10/22/2020 1206 Last data filed at 10/21/2020 1258 Gross per 24 hour  Intake 200 ml  Output --  Net 200 ml   Filed Weights   10/19/20 1545 10/20/20 0637 10/22/20 0501  Weight: 78.5 kg 78.5 kg 79.4 kg    Physical Exam    GEN- The patient is well appearing, alert and oriented x 3 today.   Head- normocephalic, atraumatic Eyes-  Sclera  clear, conjunctiva pink Ears- hearing intact Oropharynx- clear Neck- supple Lungs- Clear to ausculation bilaterally, normal work of breathing Heart- Regular rate and rhythm, no murmurs, rubs or gallops GI- soft, NT, ND, + BS Extremities- no clubbing, cyanosis, or edema Skin- no rash or lesion Psych- euthymic mood, full affect Neuro- strength and sensation are intact  Labs    CBC No results for input(s): WBC, NEUTROABS, HGB, HCT, MCV, PLT in the last 72 hours. Basic Metabolic Panel Recent Labs    10/21/20 0227 10/22/20 0035  NA 141 136  K 4.5 3.7  CL 105 104  CO2 25 24  GLUCOSE 112* 154*  BUN 11 16  CREATININE 0.61 0.70  CALCIUM 10.3 8.6*  MG 2.1 1.8    Potassium  Date/Time Value Ref Range Status  10/22/2020 12:35 AM 3.7 3.5 - 5.1 mmol/L Final   Magnesium  Date/Time Value Ref Range Status  10/22/2020 12:35 AM 1.8 1.7 - 2.4 mg/dL Final    Comment:    Performed at [facility], [place] [address] [place], [place] [address]    Telemetry    NSR 60-70s, pt did have  short run of NSVT. Overnight had short run of atrial tach that resolved spontaneously. (personally reviewed)  Radiology    No results found.   Patient Profile     [name] is a 86 y.o. female with a past medical history significant for persistent atrial fibrillation.  They were admitted for tikosyn load.   Assessment & Plan    1. Persistent atrial fibrillation Pt remains in NSR on Tikosyn 500 mcg BID  Unfortunately, her QT has prolonged.  DECREASE dose to 250 mcg BID and follow at least 2 more doses.  Continue Xarelto K 3.7 and Mg 1.8. Both have been supped.  CHA2DS2VASC is at least 6.  With QT prolongation will decrease tikosyn to 250 mcg BID and continue to observe. Will attempt to call local pharmacies to see where 250 mcg doses are available.   Discussed above with patient and her daughter. Dr. Rayann Heman aware.  For questions or updates, please contact [place] Please  consult [email or web address] for contact info under Cardiology/STEMI.  Signed, Shirley Friar, PA-C  10/22/2020, 12:06 PM    I have seen, examined the patient, and reviewed the above assessment and plan.  Changes to above are made where necessary.  On exam, RRR.  Qt has lengthened.  We will reduce dose and continue to monitor  Co Sign: Thompson Grayer, MD

## 2020-10-22 NOTE — Progress Notes (Signed)
Pharmacy: Dofetilide (Tikosyn) - Follow Up Assessment and Electrolyte Replacement  Pharmacy consulted to assist in monitoring and replacing electrolytes in this 86 y.o. female admitted on 10/19/2020 undergoing dofetilide initiation.  Labs:    Component Value Date/Time   K 3.7 10/22/2020 0035   MG 1.8 10/22/2020 0035     Plan: Potassium: K= 3.7; kdur 68meq per MD  Magnesium: -Mg= 1.8, 2gm Mg per MD   As patient has required on average 30 mEq of potassium replacement every day, recommend discharging patient with prescription for:  Potassium chloride 20 mEq  daily  Thank you for allowing pharmacy to participate in this patient's care   Hildred Laser, PharmD Clinical Pharmacist **Pharmacist phone directory can now be found on [email or web address] ([email or web address]).  Listed under [place].

## 2020-10-22 NOTE — Evaluation (Signed)
Physical Therapy Evaluation Patient Details Name: [name] MRN: [id number] DOB: [birth year] Today's Date: 10/22/2020   History of Present Illness  86 y.o. female with a h/o afib that has been on amiodarone since June of 2015. She has had many breakthrough events, last one in February with successful cardioversion. She does feel very weak in afib. She went into afib around 2 weeks. Pt underwent cardioversion on 10/21/2020.  Clinical Impression  Pt presents to PT with deficits in strength, functional mobility, gait, balance, endurance, power, and with significant R knee pain and ROM limitations. Pt currently requires physical assistance to perform all functional mobility including maxA to stand and transfer. Pt also has limited awareness of the impact these deficits have on her falls risk, still believing that if she discharged home today that she would be able to walk and negotiate stairs independently despite not being able to stand on her own at this time. PT discusses recommendation for SNF placement at this time, daughter seems agreeable however the pt would prefer to discharge home. If the pt declines PT recommendations for SNF she will then benefit from HHPT, 24/7 assistance from family or PCA, and the DME listed below.    Follow Up Recommendations SNF;Supervision/Assistance - 24 hour    Equipment Recommendations  Wheelchair (measurements PT);Wheelchair cushion (measurements PT);Hospital bed (mechanical lift, all if home today)    Recommendations for Other Services       Precautions / Restrictions Precautions Precautions: Fall Restrictions Weight Bearing Restrictions: No      Mobility  Bed Mobility Overal bed mobility: Needs Assistance Bed Mobility: Supine to Sit     Supine to sit: Mod assist;HOB elevated          Transfers Overall transfer level: Needs assistance Equipment used: Rolling walker (2 wheeled);1 person hand held assist Transfers: Sit to/from  [employer] Sit to Stand: Max assist;From elevated surface   Squat pivot transfers: Max assist;From elevated surface     General transfer comment: maxA squat pivot transfer from bed to drop-arm recliner  Ambulation/Gait                Stairs            Wheelchair Mobility    Modified Rankin (Stroke Patients Only)       Balance Overall balance assessment: Needs assistance Sitting-balance support: No upper extremity supported;Feet supported Sitting balance-Leahy Scale: Fair     Standing balance support: Bilateral upper extremity supported Standing balance-Leahy Scale: Zero Standing balance comment: mod-maxA with BUE support of PT                             Pertinent Vitals/Pain Pain Assessment: Faces Faces Pain Scale: Hurts whole lot Pain Location: R knee Pain Descriptors / Indicators: Grimacing;Moaning Pain Intervention(s): Monitored during session    Home Living Family/patient expects to be discharged to:: Private residence Living Arrangements: Alone Available Help at Discharge: Family;Available PRN/intermittently;Personal care attendant Type of Home: House Home Access: Stairs to enter Entrance Stairs-Rails: Left Entrance Stairs-Number of Steps: 3 Home Layout: One level Home Equipment: Walker - 2 wheels;Cane - single point      Prior Function Level of Independence: Independent with assistive device(s)         Comments: pt utilizes cane for all ambulation     Hand Dominance        Extremity/Trunk Assessment   Upper Extremity Assessment Upper Extremity Assessment: Generalized weakness  Lower Extremity Assessment Lower Extremity Assessment: RLE deficits/detail RLE Deficits / Details: R knee extension limited by >10 degrees PROM. knee extensor strength limited to 3-/5    Cervical / Trunk Assessment Cervical / Trunk Assessment: Kyphotic  Communication   Communication: No difficulties  Cognition  Arousal/Alertness: Awake/alert Behavior During Therapy: WFL for tasks assessed/performed Overall Cognitive Status: Impaired/Different from baseline Area of Impairment: Safety/judgement;Awareness                         Safety/Judgement: Decreased awareness of deficits Awareness: Emergent          General Comments General comments (skin integrity, edema, etc.): VSS on RA, HR in 60-70s throughout session    Exercises General Exercises - Lower Extremity Long Arc Quad: AROM;Right;15 reps   Assessment/Plan    PT Assessment Patient needs continued PT services  PT Problem List Decreased strength;Decreased activity tolerance;Decreased balance;Decreased mobility;Pain;Decreased knowledge of use of DME;Decreased safety awareness;Decreased knowledge of precautions       PT Treatment Interventions DME instruction;Gait training;Stair training;Functional mobility training;Therapeutic activities;Neuromuscular re-education;Balance training;Therapeutic exercise;Patient/family education    PT Goals (Current goals can be found in the Care Plan section)  Acute Rehab PT Goals Patient Stated Goal: to reduce pain and go home PT Goal Formulation: With patient/family Time For Goal Achievement: 11/05/20 Potential to Achieve Goals: Fair    Frequency Min 3X/week (may progress to home pending improvement in pain)   Barriers to discharge Decreased caregiver support      Co-evaluation               AM-PAC PT "6 Clicks" Mobility  Outcome Measure Help needed turning from your back to your side while in a flat bed without using bedrails?: A Lot Help needed moving from lying on your back to sitting on the side of a flat bed without using bedrails?: A Lot Help needed moving to and from a bed to a chair (including a wheelchair)?: A Lot Help needed standing up from a chair using your arms (e.g., wheelchair or bedside chair)?: A Lot Help needed to walk in hospital room?: Total Help needed  climbing 3-5 steps with a railing? : Total 6 Click Score: 10    End of Session   Activity Tolerance: Patient limited by pain Patient left: in chair;with call bell/phone within reach;with chair alarm set Nurse Communication: Mobility status;Need for lift equipment (STEDY) PT Visit Diagnosis: Other abnormalities of gait and mobility (R26.89);Muscle weakness (generalized) (M62.81)    Time: 7412-8786 PT Time Calculation (min) (ACUTE ONLY): 26 min   Charges:   PT Evaluation $PT Eval Low Complexity: 1 Low PT Treatments $Therapeutic Activity: 8-22 mins        Zenaida Niece, PT, DPT Acute Rehabilitation Pager: [phone]   Zenaida Niece 10/22/2020, 3:15 PM

## 2020-10-23 LAB — BASIC METABOLIC PANEL
Anion gap: 8 (ref 5–15)
BUN: 16 mg/dL (ref 8–23)
CO2: 24 mmol/L (ref 22–32)
Calcium: 8.7 mg/dL — ABNORMAL LOW (ref 8.9–10.3)
Chloride: 103 mmol/L (ref 98–111)
Creatinine, Ser: 0.72 mg/dL (ref 0.44–1.00)
GFR, Estimated: >60 mL/min (ref >60)
Glucose, Bld: 254 mg/dL — ABNORMAL HIGH (ref 70–99)
Potassium: 3.8 mmol/L (ref 3.5–5.1)
Sodium: 135 mmol/L (ref 135–145)

## 2020-10-23 LAB — GLUCOSE, CAPILLARY
Glucose-Capillary: 202 mg/dL — ABNORMAL HIGH (ref 70–99)
Glucose-Capillary: 97 mg/dL (ref 70–99)
Glucose-Capillary: 158 mg/dL — ABNORMAL HIGH (ref 70–99)
Glucose-Capillary: 143 mg/dL — ABNORMAL HIGH (ref 70–99)

## 2020-10-23 LAB — MAGNESIUM: Magnesium: 1.8 mg/dL (ref 1.7–2.4)

## 2020-10-23 MED ORDER — POTASSIUM CHLORIDE CRYS ER 20 MEQ PO TBCR: 40.0000 meq | EXTENDED_RELEASE_TABLET | Freq: Every day | ORAL | 1 refills | Status: DC

## 2020-10-23 MED ORDER — POTASSIUM CHLORIDE CRYS ER 20 MEQ PO TBCR
40.0000 meq | EXTENDED_RELEASE_TABLET | Freq: Every day | ORAL | Status: DC
  Administered 2020-10-24: 40 meq via ORAL
  Filled 2020-10-23: qty 2

## 2020-10-23 MED ORDER — POTASSIUM CHLORIDE CRYS ER 20 MEQ PO TBCR
40.0000 meq | EXTENDED_RELEASE_TABLET | Freq: Once | ORAL | Status: AC
  Administered 2020-10-23: 40 meq via ORAL
  Filled 2020-10-23: qty 2

## 2020-10-23 MED ORDER — DOFETILIDE 250 MCG PO CAPS: 250.0000 ug | ORAL_CAPSULE | Freq: Two times a day (BID) | ORAL | 0 refills | Status: DC

## 2020-10-23 MED ORDER — METOPROLOL TARTRATE 25 MG PO TABS: 25.0000 mg | ORAL_TABLET | Freq: Two times a day (BID) | ORAL | 5 refills | Status: DC

## 2020-10-23 MED ORDER — MAGNESIUM SULFATE 2 GM/50ML IV SOLN
2.0000 g | Freq: Once | INTRAVENOUS | Status: AC
  Administered 2020-10-23: 2 g via INTRAVENOUS
  Filled 2020-10-23: qty 50

## 2020-10-23 MED ORDER — DOFETILIDE 250 MCG PO CAPS: 250.0000 ug | ORAL_CAPSULE | Freq: Two times a day (BID) | ORAL | 11 refills | Status: DC

## 2020-10-23 MED ORDER — MAGNESIUM OXIDE 400 (241.3 MG) MG PO TABS: 400.0000 mg | ORAL_TABLET | Freq: Every day | ORAL | 1 refills | Status: DC

## 2020-10-23 MED ORDER — DOFETILIDE 125 MCG PO CAPS
125.0000 ug | ORAL_CAPSULE | Freq: Two times a day (BID) | ORAL | Status: DC
  Administered 2020-10-23 – 2020-10-24 (×2): 125 ug via ORAL
  Filled 2020-10-23 (×3): qty 1

## 2020-10-23 MED ORDER — HYDRALAZINE HCL 10 MG PO TABS
10.0000 mg | ORAL_TABLET | Freq: Once | ORAL | Status: AC
  Administered 2020-10-23: 10 mg via ORAL
  Filled 2020-10-23: qty 1

## 2020-10-23 NOTE — TOC Progression Note (Signed)
Transition of Care [facility]) - Progression Note    Patient Details  Name: [name] MRN: [id number] Date of Birth: 01-Apr-1931  [facility] CM/SW Contact  Oren Section Cleta Alberts, RN Phone Number: 10/23/2020, 5:14 PM  Clinical Narrative:   Patient declining skilled nursing facility placement; home health ordered by provider.  Met with patient to discuss home health arrangements; she politely declines arrangement of home health at this time.    Expected Discharge Plan: [place] Barriers to Discharge: Continued Medical Work up  Expected Discharge Plan and Services Expected Discharge Plan: [place] In-house Referral: Clinical Social Work   Post Acute Care Choice: [place] arrangements for the past 2 months: Single Family Home                                       Social Determinants of Health (SDOH) Interventions    Readmission Risk Interventions No flowsheet data found.  Reinaldo Raddle, RN, BSN  Trauma/Neuro ICU Case Manager [phone]

## 2020-10-23 NOTE — Progress Notes (Signed)
Her QTC was 501 with QRS 92 last night after Tikosyn 250 mcg was given.  Consulted [place], [place]  this morning.  Received order to repeat EKG for this morning.  QTC was 491 with QRS 96 in NSR. Received order to hold Tikosyn this morning until EP doctor reviews it.  Idolina Primer, RN

## 2020-10-23 NOTE — Progress Notes (Addendum)
Patients blood pressure 172/94, she is asymptomatic. Definitely higher than yesterdays readings. Paged Cardiology for possible PRN BP medication. Will continue to monitor patient.   No prn med give, blood pressure 146/85.

## 2020-10-23 NOTE — Progress Notes (Signed)
Spoke with our clinical pharmacist, patient has needed frequent potassium supplement in the hospital, will increase her daily potassium chloride to 40mg  daily and prescribe magnesium as well.   She will need outpatient BMET on followup  Note, family says she won't have enough support at home for 24/7 supervision. Case manage and social worker working on SNF. Will plan for discharge once SNF placement has been determined.   Paper Rx printed out and placed in her chart.

## 2020-10-23 NOTE — Plan of Care (Signed)
  Problem: Education: Goal: Knowledge of General Education information will improve Description: Including pain rating scale, medication(s)/side effects and non-pharmacologic comfort measures Outcome: Progressing   Problem: Health Behavior/Discharge Planning: Goal: Ability to manage health-related needs will improve Outcome: Progressing   Problem: Clinical Measurements: Goal: Ability to maintain clinical measurements within normal limits will improve Outcome: Progressing Goal: Will remain free from infection Outcome: Progressing Goal: Diagnostic test results will improve Outcome: Progressing Goal: Respiratory complications will improve Outcome: Progressing Goal: Cardiovascular complication will be avoided Outcome: Progressing   Problem: Education: Goal: Knowledge of disease or condition will improve Outcome: Progressing Goal: Understanding of medication regimen will improve Outcome: Progressing   Problem: Activity: Goal: Ability to tolerate increased activity will improve Outcome: Progressing Note: PRN pain medication.

## 2020-10-23 NOTE — Plan of Care (Signed)
  Problem: Education: Goal: Knowledge of General Education information will improve Description: Including pain rating scale, medication(s)/side effects and non-pharmacologic comfort measures Outcome: Progressing   Problem: Health Behavior/Discharge Planning: Goal: Ability to manage health-related needs will improve Outcome: Progressing   Problem: Clinical Measurements: Goal: Ability to maintain clinical measurements within normal limits will improve Outcome: Progressing Goal: Will remain free from infection Outcome: Progressing Goal: Diagnostic test results will improve Outcome: Progressing Goal: Respiratory complications will improve Outcome: Progressing Goal: Cardiovascular complication will be avoided Outcome: Progressing   Problem: Education: Goal: Knowledge of disease or condition will improve Outcome: Progressing Goal: Understanding of medication regimen will improve Outcome: Progressing Goal: Individualized Educational Video(s) Outcome: Progressing   Problem: Activity: Goal: Ability to tolerate increased activity will improve Outcome: Progressing   Problem: Cardiac: Goal: Ability to achieve and maintain adequate cardiopulmonary perfusion will improve Outcome: Progressing   Problem: Health Behavior/Discharge Planning: Goal: Ability to safely manage health-related needs after discharge will improve Outcome: Progressing

## 2020-10-23 NOTE — TOC Initial Note (Signed)
Transition of Care [facility]) - Initial/Assessment Note    Patient Details  Name: [name] MRN: [id number] Date of Birth: [birth year]  Transition of Care [facility]) CM/SW Contact:    Varney Baas Phone Number: 10/23/2020, 12:50 PM  Clinical Narrative:                 CSW met with patient to discuss PT recommendation for short-term SNF placement. Patient's daughter Shirlean Mylar was present in the room. Patient expressed understanding of recommendation and stated she plans to return home at discharge. Patient lives alone and reported she has a CNA that she can arrange to come to the home daily. Patient's daughter stated she could come to the home sometimes, but could not provide 24/7 care. Patient stated her son is typically very helpful, but he recently broke his leg. Patient expressed confidence in the ability to return home with assistance from CNA and [place]. TOC team will continue to follow and assist with discharge planning.   Expected Discharge Plan: [place] Barriers to Discharge: Continued Medical Work up   Patient Goals and CMS Choice Patient states their goals for this hospitalization and ongoing recovery are:: to return home. CMS Medicare.gov Compare Post Acute Care list provided to:: Patient Choice offered to / list presented to : Patient  Expected Discharge Plan and Services Expected Discharge Plan: [place] In-house Referral: Clinical Social Work   Post Acute Care Choice: [place] arrangements for the past 2 months: [place]                                      Prior Living Arrangements/Services Living arrangements for the past 2 months: Single Family Home Lives with:: Self Patient language and need for interpreter reviewed:: Yes Do you feel safe going back to the place where you live?: Yes      Need for Family Participation in Patient Care: Yes (Comment) Care giver support system in place?: Yes  (comment)   Criminal Activity/Legal Involvement Pertinent to Current Situation/Hospitalization: No - Comment as needed  Activities of Daily Living Home Assistive Devices/Equipment: Walker (specify type),Cane (specify quad or straight),Grab bars in shower ADL Screening (condition at time of admission) Patient's cognitive ability adequate to safely complete daily activities?: Yes Is the patient deaf or have difficulty hearing?: No Does the patient have difficulty seeing, even when wearing glasses/contacts?: No Does the patient have difficulty concentrating, remembering, or making decisions?: No Patient able to express need for assistance with ADLs?: Yes Does the patient have difficulty dressing or bathing?: No Independently performs ADLs?: Yes (appropriate for developmental age) Does the patient have difficulty walking or climbing stairs?: Yes Weakness of Legs: Both Weakness of Arms/Hands: None  Permission Sought/Granted                  Emotional Assessment Appearance:: Appears stated age Attitude/Demeanor/Rapport: Self-Confident Affect (typically observed): Pleasant Orientation: : Oriented to Place,Oriented to  Time,Oriented to Situation,Oriented to Self Alcohol / Substance Use: Not Applicable Psych Involvement: No (comment)  Admission diagnosis:  Afib ([place]) [I48.91] Patient Active Problem List   Diagnosis Date Noted   Afib ([place]) 10/19/2020   A-fib ([place])    Dilated cardiomyopathy ([place]) 08/27/2018   Acute bronchitis 11/15/2017   S/P Nissen fundoplication (without gastrostomy tube) procedure 05/25/2017   Persistent atrial fibrillation ([place])    Chronic cough 01/02/2017  Lumbar stenosis with neurogenic claudication 04/20/2016   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   Malignant neoplasm of breast ([place]) 02/25/2016   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   Post-surgical hypothyroidism 04/09/2014   Anticoagulated by anticoagulation treatment - Eliquis  03/30/2014   Diabetes mellitus without complication (HCC)    Heart murmur    DJD (degenerative joint disease) of knee    Glaucoma    History of laser assisted in situ keratomileusis 07/25/2012   Degenerative disorder of eye 07/25/2012   Exotropia, intermittent 07/09/2012   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   Error, refractive, myopia 07/09/2012   ANEMIA, IRON DEFICIENCY 10/14/2010   GERD 10/14/2010   THYROID CANCER 10/13/2010   HYPERCHOLESTEROLEMIA 10/13/2010   ANEMIA, UNSPECIFIED 10/13/2010   Anxiety state 10/13/2010   MIGRAINE HEADACHE 10/13/2010   Essential hypertension 10/13/2010   EROSIVE ESOPHAGITIS 10/13/2010   BARRETTS ESOPHAGUS 10/13/2010   Hiatal hernia s/p robotic repair & fundoplication 0/86/5784 69/62/9528   DIVERTICULOSIS, COLON 10/13/2010   Irritable bowel syndrome 10/13/2010   OSTEOARTHRITIS 10/13/2010   CARDIAC MURMUR 10/13/2010   COLONIC POLYPS, ADENOMATOUS, HX OF 10/13/2010   PCP:  Lavone Orn, MD Pharmacy:   [facility] [place], [address] [place] [address] Phone: [phone] Fax: [phone]  [place], [place] [place], [address] [place], [place] [address] Phone: [phone] Fax: [facility] [place], [place] [address] Phone: [phone] Fax: [phone]  [facility] [address], [place] [address] Phone: [phone] Fax: [phone]     Social Determinants of Health (SDOH) Interventions    Readmission Risk Interventions No flowsheet data found.

## 2020-10-23 NOTE — Progress Notes (Signed)
QTc is still prolonged, discussed with the patient and Dr. Lovena Le, will reduce tikosyn to 125mg  BID starting tonight and plan for D/C tomorrow. Also patient refused SNF, I have ordered face to face PT/OT. Per case manager, family can arrange home health aide to stay with the patient.

## 2020-10-23 NOTE — Progress Notes (Signed)
Pharmacy: Dofetilide (Tikosyn) - Follow Up Assessment and Electrolyte Replacement  Pharmacy consulted to assist in monitoring and replacing electrolytes in this 86 y.o. female admitted on 10/19/2020 undergoing dofetilide initiation. First dofetilide dose: 12/7 @1944   Labs:    Component Value Date/Time   K 3.8 10/23/2020 0725   MG 1.8 10/23/2020 0725     Plan: Potassium: K 3.8-3.9:  Give KCl 40 mEq po x1   Magnesium: Mg 1.8-2: Give Mg 2 gm IV x1    As patient has required on average 50 mEq of potassium replacement every day, recommend discharging patient with prescription for:  Potassium chloride 40 mEq PO daily and magnesium oxide 400 mg PO daily  Thank you for allowing pharmacy to participate in this patient's care   Shauna Hugh, PharmD, [place]  PGY-1 Pharmacy Resident 10/23/2020 8:41 AM  Please check [email or web address] for unit-specific pharmacy phone numbers.

## 2020-10-23 NOTE — Progress Notes (Signed)
Progress Note  Patient Name: [name] Date of Encounter: 10/23/2020  Primary Cardiologist: Sherren Mocha, MD   Subjective   "I want to go home."  Inpatient Medications    Scheduled Meds: . apixaban  5 mg Oral BID  . cholecalciferol  5,000 Units Oral Daily  . dofetilide  250 mcg Oral BID  . levothyroxine  112 mcg Oral Q0600  . loratadine  10 mg Oral Daily  . LORazepam  1.5 mg Oral QHS  . magnesium oxide  400 mg Oral Daily  . metFORMIN  500 mg Oral BID WC  . metoprolol tartrate  25 mg Oral BID  . pantoprazole  40 mg Oral Daily  . [START ON 10/24/2020] potassium chloride SA  40 mEq Oral Daily  . rosuvastatin  10 mg Oral QHS  . sertraline  50 mg Oral Daily  . sodium chloride flush  3 mL Intravenous Q12H  . vitamin B-12  1,000 mcg Oral Daily   Continuous Infusions: . sodium chloride     PRN Meds: sodium chloride, acetaminophen, sodium chloride flush   Vital Signs    Vitals:   10/23/20 0420 10/23/20 0539 10/23/20 0807 10/23/20 0949  BP:  (!) 146/85 (!) 141/71 139/82  Pulse:    70  Resp:   (!) 22   Temp:   98.2 F (36.8 C)   TempSrc:   Oral   SpO2:      Weight: 78.9 kg     Height:        Intake/Output Summary (Last 24 hours) at 10/23/2020 1256 Last data filed at 10/23/2020 0949 Gross per 24 hour  Intake 923 ml  Output 700 ml  Net 223 ml   Filed Weights   10/20/20 0637 10/22/20 0501 10/23/20 0420  Weight: 78.5 kg 79.4 kg 78.9 kg    Telemetry    nsr - Personally Reviewed  ECG    NSR with QTC 470 - Personally Reviewed  Physical Exam   GEN: No acute distress.   Neck: No JVD Cardiac: RRR, no murmurs, rubs, or gallops.  Respiratory: Clear to auscultation bilaterally. GI: Soft, nontender, non-distended  MS: No edema; No deformity. Neuro:  Nonfocal  Psych: Normal affect   Labs    Chemistry Recent Labs  Lab 10/21/20 0227 10/22/20 0035 10/23/20 0725  NA 141 136 135  K 4.5 3.7 3.8  CL 105 104 103  CO2 25 24 24   GLUCOSE 112* 154*  254*  BUN 11 16 16   CREATININE 0.61 0.70 0.72  CALCIUM 10.3 8.6* 8.7*  GFRNONAA >60 >60 >60  ANIONGAP 11 8 8      HematologyNo results for input(s): WBC, RBC, HGB, HCT, MCV, MCH, MCHC, RDW, PLT in the last 168 hours.  Cardiac EnzymesNo results for input(s): TROPONINI in the last 168 hours. No results for input(s): TROPIPOC in the last 168 hours.   BNPNo results for input(s): BNP, PROBNP in the last 168 hours.   DDimer No results for input(s): DDIMER in the last 168 hours.   Radiology    No results found.  Cardiac Studies   none  Patient Profile     86 y.o. female admitted with atrial fib for initiation of dofetilide but with inability to take of herself as per PT awaiting SNF.  Assessment & Plan    1. Atrial fib - she is maintaining NSR. She will continue dofetilide 250 q12. 2. Placement - she is awaiting SNF. She is stable from medical perspective.  Carleene Overlie [name],MD  For questions or updates, please contact [facility] Please consult [email or web address] for contact info under Cardiology/STEMI.      Signed, Cristopher Peru, MD  10/23/2020, 12:56 PM  Patient ID: [name], female   DOB: 1930-12-01, 86 y.o.   MRN: [id number]

## 2020-10-23 NOTE — Progress Notes (Signed)
Physical Therapy Treatment Patient Details Name: [name] MRN: [id number] DOB: 17-Nov-1930 Today's Date: 10/23/2020    History of Present Illness 86 y.o. female with a h/o afib that has been on amiodarone since June of 2015. She has had many breakthrough events, last one in February with successful cardioversion. She does feel very weak in afib. She went into afib around 2 weeks. Pt underwent cardioversion on 10/21/2020.    PT Comments    Pt is willing to work on standing attempts and was successful with getting upright on the walker.  Her achievement made three short walks possible, and then tolerated ROM to knees and hips well.  Pt is making progress but still is max assist to stand.  This makes the SNF plan still very reasonable, although pt may elect to go directly home despite this.  Follow for goals of acute PT.   Follow Up Recommendations  SNF     Equipment Recommendations  Wheelchair (measurements PT);Wheelchair cushion (measurements PT);Hospital bed    Recommendations for Other Services       Precautions / Restrictions Precautions Precautions: Fall Restrictions Weight Bearing Restrictions: No    Mobility  Bed Mobility               General bed mobility comments: in chair when PT arrived  Transfers Overall transfer level: Needs assistance Equipment used: Rolling walker (2 wheeled);1 person hand held assist Transfers: Sit to/from Stand Sit to Stand: Max assist;From elevated surface         General transfer comment: max to stand from chair with cues for capturing balance to stand at walker  Ambulation/Gait Ambulation/Gait assistance: Min assist Gait Distance (Feet): 18 Feet (6+6+6) Assistive device: Rolling walker (2 wheeled);1 person hand held assist Gait Pattern/deviations: Step-to pattern;Step-through pattern;Decreased stride length;Wide base of support Gait velocity: reduced Gait velocity interpretation: <1.8 ft/sec, indicate of risk for  recurrent falls General Gait Details: forward and backward steps to initiate gait   Stairs             Wheelchair Mobility    Modified Rankin (Stroke Patients Only)       Balance Overall balance assessment: Needs assistance Sitting-balance support: Feet supported Sitting balance-Leahy Scale: Fair     Standing balance support: Bilateral upper extremity supported;During functional activity Standing balance-Leahy Scale: Poor                              Cognition Arousal/Alertness: Awake/alert Behavior During Therapy: WFL for tasks assessed/performed Overall Cognitive Status: Impaired/Different from baseline Area of Impairment: Problem solving;Following commands                       Following Commands: Follows one step commands with increased time;Follows one step commands inconsistently Safety/Judgement: Decreased awareness of deficits Awareness: Intellectual Problem Solving: Slow processing;Requires verbal cues;Requires tactile cues        Exercises General Exercises - Lower Extremity Ankle Circles/Pumps: AROM;5 reps Long Arc Quad: AAROM;Strengthening;10 reps Heel Slides: AAROM;Strengthening;10 reps Hip ABduction/ADduction: Strengthening;10 reps Hip Flexion/Marching: AROM;AAROM;10 reps Toe Raises: Strengthening;10 reps    General Comments General comments (skin integrity, edema, etc.): O2 sats controlled and pulses 70-80      Pertinent Vitals/Pain Pain Assessment: Faces Faces Pain Scale: Hurts little more Pain Location: R knee Pain Descriptors / Indicators: Grimacing;Guarding Pain Intervention(s): Monitored during session;Repositioned    Home Living  Prior Function            PT Goals (current goals can now be found in the care plan section) Acute Rehab PT Goals Patient Stated Goal: to reduce pain and go home Progress towards PT goals: Progressing toward goals    Frequency    Min  3X/week      PT Plan Current plan remains appropriate    Co-evaluation              AM-PAC PT "6 Clicks" Mobility   Outcome Measure  Help needed turning from your back to your side while in a flat bed without using bedrails?: A Lot Help needed moving from lying on your back to sitting on the side of a flat bed without using bedrails?: A Lot Help needed moving to and from a bed to a chair (including a wheelchair)?: A Lot Help needed standing up from a chair using your arms (e.g., wheelchair or bedside chair)?: A Lot Help needed to walk in hospital room?: A Little Help needed climbing 3-5 steps with a railing? : A Little 6 Click Score: 14    End of Session Equipment Utilized During Treatment: Gait belt Activity Tolerance: No increased pain;Patient limited by fatigue Patient left: in chair;with call bell/phone within reach;with chair alarm set Nurse Communication: Mobility status PT Visit Diagnosis: Other abnormalities of gait and mobility (R26.89);Muscle weakness (generalized) (M62.81)     Time: 2919-1660 PT Time Calculation (min) (ACUTE ONLY): 26 min  Charges:  $Gait Training: 8-22 mins $Therapeutic Exercise: 8-22 mins                   Ramond Dial 10/23/2020, 5:38 PM  Mee Hives, PT MS Acute Rehab Dept. Number: [place]

## 2020-10-24 ENCOUNTER — Other Ambulatory Visit: Payer: Self-pay

## 2020-10-24 ENCOUNTER — Encounter (HOSPITAL_COMMUNITY): Payer: Self-pay | Admitting: Internal Medicine

## 2020-10-24 LAB — BASIC METABOLIC PANEL
Anion gap: 9 (ref 5–15)
BUN: 15 mg/dL (ref 8–23)
CO2: 25 mmol/L (ref 22–32)
Calcium: 8.6 mg/dL — ABNORMAL LOW (ref 8.9–10.3)
Chloride: 104 mmol/L (ref 98–111)
Creatinine, Ser: 0.61 mg/dL (ref 0.44–1.00)
GFR, Estimated: >60 mL/min (ref >60)
Glucose, Bld: 149 mg/dL — ABNORMAL HIGH (ref 70–99)
Potassium: 4 mmol/L (ref 3.5–5.1)
Sodium: 138 mmol/L (ref 135–145)

## 2020-10-24 LAB — MAGNESIUM: Magnesium: 1.7 mg/dL (ref 1.7–2.4)

## 2020-10-24 LAB — GLUCOSE, CAPILLARY
Glucose-Capillary: 154 mg/dL — ABNORMAL HIGH (ref 70–99)
Glucose-Capillary: 167 mg/dL — ABNORMAL HIGH (ref 70–99)

## 2020-10-24 MED ORDER — DOFETILIDE 125 MCG PO CAPS: 125.0000 ug | ORAL_CAPSULE | Freq: Two times a day (BID) | ORAL | 11 refills | Status: DC

## 2020-10-24 MED ORDER — MAGNESIUM SULFATE 4 GM/100ML IV SOLN
4.0000 g | Freq: Once | INTRAVENOUS | Status: AC
  Administered 2020-10-24: 4 g via INTRAVENOUS
  Filled 2020-10-24: qty 100

## 2020-10-24 MED ORDER — MAGNESIUM SULFATE 2 GM/50ML IV SOLN: 2.0000 g | Freq: Once | INTRAVENOUS | Status: DC

## 2020-10-24 MED ORDER — MAGNESIUM OXIDE 400 (241.3 MG) MG PO TABS: 400.0000 mg | ORAL_TABLET | Freq: Two times a day (BID) | ORAL | 3 refills | Status: DC

## 2020-10-24 NOTE — Discharge Summary (Addendum)
Discharge Summary    Patient ID: [name] MRN: [id number]; DOB: Jul 22, 1931  Admit date: 10/19/2020 Discharge date: 10/24/2020  Primary Care Provider: Lavone Orn, MD  Primary Cardiologist: Sherren Mocha, MD  Primary Electrophysiologist:  None   Discharge Diagnoses    Principal Problem:   A-fib [facility]) Active Problems:   Essential hypertension   Diabetes mellitus without complication [facility])    Diagnostic Studies/Procedures    DCCV 10/21/2020 Cardioverted 1 time(s).  Cardioversion with synchronized biphasic 120J shock.  Evaluation: Findings: Post procedure EKG shows: NSR - sinus bradycardia Complications: None Patient did tolerate procedure well. _____________   History of Present Illness     [name] is a 86 y.o. female with with a h/o afib that has been on amiodarone since June of 2015. She has had many breakthrough events, last one in February with successful cardioversion. She does feel very weak in afib. She went into afib around 2 weeks ago and a RN came out to her house on Friday and documented the afib.  Dr. Rayann Heman was called and left instructions to increase amio to 200 mg bid and stop losartan. Apparently her BP was low on that day.  She is in now in the afib clinc and she is rate controlled and BP is stable. She reports around 2-3 weeks ago she first had 2 teeth pulled and then a shot of cortisone in her knee. She hasn't felt well since. She usually requires a cardioversion to restore SR.    F/u in afib clinic 7/15 after successful cardioversion. She is in SR today and feels improved. She is back to Amio 200 mg daily.  F/u in afib clinic, 11/25/19, with feelings of not being well and being out of rhythm x one week. She has had an UTI and just finished a round of doxycycline. EKG shows atrial flutter  variable rate at 110 bpm.CHA2DS2-VASc Score = 6, continues eliquis 5 mg bid. 1   F/u in  afib clinic, 12/16/18. She had a successful cardioversion but  unfortunately felt like it only lasted one day. She is in afib in the 90's. I will try to increase BB and keep amio  at 200 mg a little longer to see if she may return to [place].   F/u  in afib clinic, 01/15/20.  F/u cardioversion 2/17. She was kept on her higher dose of BB at time of cardioversion as she did covert out but with PAC's. She saw [name] one week out and was staying in [name]. She  was left on amiodarone 200 mg bid until f/u here. She called the office earlier in the week and c/o of feeling weak. EKG shows  Sinus brady at 48 bpm. I feel her bradycardia is the reason for her weakness and will titrate meds down today.   F/u 01/22/20. She remains  in SR  in the low 60's but does not feel that much better.  Back on amiodarone 200 mg daily from  BID and lower dose of BB. Marland Kitchen She had some GI distress yesterday and recently had issues with UTI. She  denies urgency or frequency today.  F/u in afib clinic, 4/1, as pt has gone back into afib. Discussed with her I feel that amiodarone is no longer effective and I think it is best to stop drug at this point and rate control. If qt shortens off 3 month amiodarone wash out, currently at 512 ms then tikosyn may be an option down the line. She  has multiple cardioversions with significant ERAF.  F/u in afib clinic, 4/14. She is feeling better off amiodarone and she is rate controlled in the 80's but would like to see her a little slower so will increase BB today. We can consider Tikosyn after  3 month amiodarone wash out if qt interval shortens.    She actually is feeling better and had the enegy to do more around her house and some light exercise. She is upset today as she had some diarrhea this am and had an accident in the clinic.i feel this is why her BP is higher today as she usually  does not have high BP. She feels it may the the plate of food her neighbor gave her last night. She is rate controlled at home in the 70's and 80's.   F/u in afib clinic,  6/29. She remains in rate controlled afib and states that she feels very good. She is exercising without any significant shortness of breath and is rate controlled. No excessive fluid retention. I feel that amiodarone may have been contributing to fatigue and she feels better off drug. She remains on eliquis 5 mg bid for a CHA2DS2VASc score of 6.  F/u in afib clinic, 08/04/20. She  remains in rate controlled afib and initially tells me that she is exercising and feels great, but then tells me she  feels bad and would love to get back in rhythm. QT corrected around 430 ms. She has now been off amiodarone x 5 months.   F/u in afib clinic, 10/19/20,  as pt wants to be admitted for Tikosyn initiation. She  states that she feels terrible in afib. She has been living afib for several months . I cut back her BB yesterday as she is slow in SR when she returns to SR and she is  not quite as well rate controlled today. She has been on 50 mg metoprolol bid with nice rate control.   She failed amiodarone after taking for years  and it was stopped late Spring after multiple cardioversion that would only hold short term.  She  has been off drug for sufficient period of time to try Tikosyn Her qtc today is 480 ms, but in SR with amiodarone on board was 460 ms, EKg  in spring 2015 without amio was 439 ms.  No benadryl use.  No missed anticoagulation. Electrolytes ok for admission with crcl cal at 64 so she will qualify for 500 msc. Covid negative. + vaccines  Today, she denies symptoms of chest pain, shortness of breath, orthopnea, PND, lower extremity edema, dizziness, presyncope, syncope, or neurologic sequela.Perisistent cough. The patient is tolerating medications without difficulties and is otherwise without complaint today.   I examined patient on her arrival to [facility]. Answered her and her daughters questions.   Hospital Course     Consultants: N/A   Patient was admitted for Tikosyn loading.  She was initially  placed on 500 mg twice daily dosing.  She subsequently underwent DCCV by Dr. Margaretann Loveless on 10/21/2020.  Tikosyn dose was eventually decreased to 250 mg twice daily due to prolonged QTC.  This dose was further decreased to 125 mg twice a day in the afternoon of 12/11.  During the hospitalization, patient required frequent extra potassium and magnesium supplement.  I have discussed with our clinical pharmacist who recommended to start the patient on 400 mg twice daily dosing of magnesium and increase home dose of potassium to 40 mEq daily.  Patient was  seen in the morning of 10/24/2020 at which time she was doing well without any chest pain or shortness of breath.  She is maintaining sinus rhythm.  Although EKG shows prolonged QTC at 493, however this was personally reviewed by Dr. Lovena Le, he calculated the QTC to be 460 instead.  Last night's EKG QTC was 420.  Patient is cleared for discharge from cardiology perspective.   Due to the fact it is the weekend, we were unable to obtain extra bottle of tikosyn from [facility].  Our pharmacist has called multiple pharmacies and identified [facility] on [address] to have tikosyn on stock.  Outpatient follow-up has been arranged with A. fib clinic.  Given potassium and magnesium dosage adjustment, she will need repeat basic metabolic panel and magnesium on follow-up.  She will also need a repeat EKG to follow-up on the QTC.  Addendum: I have called the [facility] on [address], they do not have the 144m tikosyn in stock, I have asked the pharmacist to transfer the Rx to [facility]    Did the patient have an acute coronary syndrome (MI, NSTEMI, STEMI, etc) this admission?:  No                               Did the patient have a percutaneous coronary intervention (stent / angioplasty)?:  No.       _____________  Discharge Vitals Blood pressure 138/72, pulse 74, temperature 97.6 F (36.4  C), temperature source Oral, resp. rate 18, height 5' 6.5" (1.689 m), weight 78.9 kg, SpO2 99 %.  Filed Weights   10/20/20 0637 10/22/20 0501 10/23/20 0420  Weight: 78.5 kg 79.4 kg 78.9 kg    Labs & Radiologic Studies    CBC No results for input(s): WBC, NEUTROABS, HGB, HCT, MCV, PLT in the last 72 hours. Basic Metabolic Panel Recent Labs    10/23/20 0725 10/24/20 0559  NA 135 138  K 3.8 4.0  CL 103 104  CO2 24 25  GLUCOSE 254* 149*  BUN 16 15  CREATININE 0.72 0.61  CALCIUM 8.7* 8.6*  MG 1.8 1.7   Liver Function Tests No results for input(s): AST, ALT, ALKPHOS, BILITOT, PROT, ALBUMIN in the last 72 hours. No results for input(s): LIPASE, AMYLASE in the last 72 hours. High Sensitivity Troponin:   No results for input(s): TROPONINIHS in the last 720 hours.  BNP Invalid input(s): POCBNP D-Dimer No results for input(s): DDIMER in the last 72 hours. Hemoglobin A1C No results for input(s): HGBA1C in the last 72 hours. Fasting Lipid Panel No results for input(s): CHOL, HDL, LDLCALC, TRIG, CHOLHDL, LDLDIRECT in the last 72 hours. Thyroid Function Tests No results for input(s): TSH, T4TOTAL, T3FREE, THYROIDAB in the last 72 hours.  Invalid input(s): FREET3 _____________  No results found. Disposition   Pt is being discharged home today in good condition.  Follow-up Plans & Appointments     Follow-up Information    AThompson Grayer MD Follow up on 12/03/2020.   Specialty: Cardiology Why: at 314for post tikosyn 1 month follow up Contact information: [phone]       [place].   Specialty: Cardiology Why: at 0900 for post hospital tikosyn check Contact information: [id number]  [phone]               Discharge Medications   Allergies as of 10/24/2020      Reactions   Penicillins  Itching, Rash, Other (See Comments)   Did it involve swelling of the face/tongue/throat, SOB, or low BP? No Did it involve sudden or severe rash/hives, skin peeling, or any reaction on the inside of your mouth or nose? No Did you need to seek medical attention at a hospital or doctor's office? No When did it last happen?30+ years If all above answers are "NO", may proceed with cephalosporin use.      Medication List    STOP taking these medications   acetaminophen 650 MG CR tablet Commonly known as: TYLENOL     TAKE these medications   Accu-Chek Guide Me w/Device Kit   Accu-Chek Guide test strip Generic drug: glucose blood   cetirizine 10 MG tablet Commonly known as: ZYRTEC Take 10 mg by mouth daily.   CITRACAL PO Take 800 mg by mouth daily.   Dialyvite Vitamin D 5000 125 MCG (5000 UT) capsule Generic drug: Cholecalciferol Take 5,000 Units by mouth daily.   dofetilide 125 MCG capsule Commonly known as: TIKOSYN Take 1 capsule (125 mcg total) by mouth 2 (two) times daily.   Eliquis 5 MG Tabs tablet Generic drug: apixaban TAKE 1 TABLET TWICE DAILY What changed: how much to take   ferrous sulfate 325 (65 FE) MG tablet Take 325 mg by mouth daily with breakfast.   fluticasone 50 MCG/ACT nasal spray Commonly known as: FLONASE Place 1 spray into both nostrils at bedtime as needed for allergies.   lansoprazole 30 MG capsule Commonly known as: PREVACID Take 30 mg by mouth daily as needed (acid reflux).   levothyroxine 112 MCG tablet Commonly known as: SYNTHROID Take 112 mcg by mouth daily before breakfast.   LORazepam 1 MG tablet Commonly known as: ATIVAN Take 1.5 mg by mouth at bedtime.   Lutein 6 MG Caps Take 6 mg by mouth daily.   magnesium oxide 400 (241.3 Mg) MG tablet Commonly known as: MAG-OX Take 1 tablet (400 mg total) by mouth 2 (two) times daily.   metFORMIN 500 MG 24 hr tablet Commonly known as: GLUCOPHAGE-XR Take 500 mg by mouth 2 (two)  times daily.   metoprolol tartrate 25 MG tablet Commonly known as: LOPRESSOR Take 1 tablet (25 mg total) by mouth 2 (two) times daily. What changed:   medication strength  how much to take   potassium chloride SA 20 MEQ tablet Commonly known as: KLOR-CON Take 2 tablets (40 mEq total) by mouth daily. What changed: how much to take   PreserVision AREDS 2 Caps Take 1 capsule by mouth daily.   Refresh 1.4-0.6 % Soln Generic drug: Polyvinyl Alcohol-Povidone PF Place 1 drop into both eyes daily as needed (dry eyes).   rosuvastatin 10 MG tablet Commonly known as: CRESTOR Take 10 mg by mouth at bedtime.   sertraline 50 MG tablet Commonly known as: ZOLOFT Take 50 mg by mouth daily.   vitamin B-12 1000 MCG tablet Commonly known as: CYANOCOBALAMIN Take 1,000 mcg by mouth daily.   ZINC PO Take 0.5 tablets by mouth 2 (two) times a week.          Outstanding Labs/Studies   BMET and Magnesium  Duration of Discharge Encounter   Greater than 30 minutes including physician time.  Hilbert Corrigan, PA 10/24/2020, 12:26 PM  EP Attending Patient seen and  examined. Agree with above. The patient is stable for DC home. Her QTC was 460 on my review. She will be discharged with usual followup on meds as outlined above.  Carleene Overlie Oretha Weismann,MD

## 2020-10-24 NOTE — Progress Notes (Signed)
EKG completed 2 hours after Tikosyn and placed on pts chart.

## 2020-10-24 NOTE — Progress Notes (Addendum)
Progress Note  Patient Name: [name] Date of Encounter: 10/24/2020  [facility] Cardiologist: Sherren Mocha, MD   Subjective   Denies any CP or SOB. Eager to go home. Refused SNF  Inpatient Medications    Scheduled Meds: . apixaban  5 mg Oral BID  . cholecalciferol  5,000 Units Oral Daily  . dofetilide  125 mcg Oral BID  . levothyroxine  112 mcg Oral Q0600  . loratadine  10 mg Oral Daily  . LORazepam  1.5 mg Oral QHS  . magnesium oxide  400 mg Oral Daily  . metFORMIN  500 mg Oral BID WC  . metoprolol tartrate  25 mg Oral BID  . pantoprazole  40 mg Oral Daily  . potassium chloride SA  40 mEq Oral Daily  . rosuvastatin  10 mg Oral QHS  . sertraline  50 mg Oral Daily  . sodium chloride flush  3 mL Intravenous Q12H  . vitamin B-12  1,000 mcg Oral Daily   Continuous Infusions: . sodium chloride    . magnesium sulfate bolus IVPB 4 g (10/24/20 1120)   PRN Meds: sodium chloride, acetaminophen, sodium chloride flush   Vital Signs    Vitals:   10/23/20 1930 10/23/20 2135 10/24/20 0000 10/24/20 0402  BP: 138/74 (!) 155/82 (!) 142/74 138/72  Pulse: 68 68 77 74  Resp: 20  20 18   Temp: 97.6 F (36.4 C)  97.6 F (36.4 C) 97.6 F (36.4 C)  TempSrc: Oral  Oral Oral  SpO2: 99%  98% 99%  Weight:      Height:        Intake/Output Summary (Last 24 hours) at 10/24/2020 1155 Last data filed at 10/24/2020 1140 Gross per 24 hour  Intake 600 ml  Output 300 ml  Net 300 ml   Last 3 Weights 10/23/2020 10/22/2020 10/20/2020  Weight (lbs) 173 lb 15.1 oz 175 lb 0.7 oz 173 lb  Weight (kg) 78.9 kg 79.4 kg 78.472 kg      Telemetry    NSR without recurrent afib - Personally Reviewed  ECG    NSR without TWI in the anterior leads, QTc 493 - Personally Reviewed  Physical Exam   GEN: No acute distress.   Neck: No JVD Cardiac: RRR, no murmurs, rubs, or gallops.  Respiratory: Clear to auscultation bilaterally. GI: Soft, nontender, non-distended  MS: No edema; No  deformity. Neuro:  Nonfocal  Psych: Normal affect   Labs    High Sensitivity Troponin:  No results for input(s): TROPONINIHS in the last 720 hours.    Chemistry Recent Labs  Lab 10/22/20 0035 10/23/20 0725 10/24/20 0559  NA 136 135 138  K 3.7 3.8 4.0  CL 104 103 104  CO2 24 24 25   GLUCOSE 154* 254* 149*  BUN 16 16 15   CREATININE 0.70 0.72 0.61  CALCIUM 8.6* 8.7* 8.6*  GFRNONAA >60 >60 >60  ANIONGAP 8 8 9      HematologyNo results for input(s): WBC, RBC, HGB, HCT, MCV, MCH, MCHC, RDW, PLT in the last 168 hours.  BNPNo results for input(s): BNP, PROBNP in the last 168 hours.   DDimer No results for input(s): DDIMER in the last 168 hours.   Radiology    No results found.  Cardiac Studies   Echo 07/21/2020 1. Left ventricular ejection fraction, by estimation, is 70 to 75%. The  left ventricle has hyperdynamic function. The left ventricle has no  regional wall motion abnormalities. There is moderate left ventricular  hypertrophy.  Left ventricular diastolic  parameters are consistent with Grade I diastolic dysfunction (impaired  relaxation).  2. Right ventricular systolic function is normal. The right ventricular  size is normal. There is mildly elevated pulmonary artery systolic  pressure.  3. Left atrial size was moderately dilated.  4. Right atrial size was mildly dilated.  5. The mitral valve is grossly normal. Mild mitral valve regurgitation.  No evidence of mitral stenosis. Severe mitral annular calcification.  6. The aortic valve is tricuspid. There is moderate calcification of the  aortic valve. There is moderate thickening of the aortic valve. Aortic  valve regurgitation is trivial. No aortic stenosis is present. Aortic  regurgitation PHT measures 474 msec.  7. The inferior vena cava is normal in size with greater than 50%  respiratory variability, suggesting right atrial pressure of 3 mmHg.   Patient Profile     86 y.o. female admitted with atrial  fibrillation for initiation of dofetilide.   Assessment & Plan    1. Atrial fibrillation  - admitted for dofetilide loading. Underwent DCCV on 12/9. Previously had fatigue on amiodarone  - QTc prolonged, tikosyn decreased to 250mg  BID on 12/10, then decreased again to 125mg  BID.   - seen by Dr. Lovena Le, this morning, EKG obtained after tikosyn this morning showed QTc 493. EKG personally reviewed by Dr. Lovena Le, who has calculated the QTc on the last 2 EKGs as 420 and 460 respectly. No significant prolongation noted.   - she has follow up with Afib clinic on 12/17. Tikosyn has been sent to local pharmacy and our pharmacist has confirmed they do have [place] in stock.   2. HTN  For questions or updates, please contact [place] Please consult [email or web address] for contact info under     Signed, Almyra Deforest, [place]  10/24/2020, 11:55 AM    EP Attending  Patient seen and examined. Agree with above. The QTC on my review is 460. She can be discharged home with usual followup.  Carleene Overlie Kemiah Booz,MD

## 2020-10-24 NOTE — Discharge Instructions (Signed)
Addendum: I have called the [facility] on [address], they do not have the 125mg  tikosyn in stock, I have asked the pharmacist to transfer all the new Rx to [facility] at [facility].    Electrical Cardioversion Electrical cardioversion is the delivery of a jolt of electricity to restore a normal rhythm to the heart. A rhythm that is too fast or is not regular keeps the heart from pumping well. In this procedure, sticky patches or metal paddles are placed on the chest to deliver electricity to the heart from a device. This procedure may be done in an emergency if:  There is low or no blood pressure as a result of the heart rhythm.  Normal rhythm must be restored as fast as possible to protect the brain and heart from further damage.  It may save a life. This may also be a scheduled procedure for irregular or fast heart rhythms that are not immediately life-threatening. Tell a health care provider about:  Any allergies you have.  All medicines you are taking, including vitamins, herbs, eye drops, creams, and over-the-counter medicines.  Any problems you or family members have had with anesthetic medicines.  Any blood disorders you have.  Any surgeries you have had.  Any medical conditions you have.  Whether you are pregnant or may be pregnant. What are the risks? Generally, this is a safe procedure. However, problems may occur, including:  Allergic reactions to medicines.  A blood clot that breaks free and travels to other parts of your body.  The possible return of an abnormal heart rhythm within hours or days after the procedure.  Your heart stopping (cardiac arrest). This is rare. What happens before the procedure? Medicines  Your health care provider may have you start taking: ? Blood-thinning medicines (anticoagulants) so your blood does not clot as easily. ? Medicines to help stabilize your heart rate and rhythm.  Ask  your health care provider about: ? Changing or stopping your regular medicines. This is especially important if you are taking diabetes medicines or blood thinners. ? Taking medicines such as aspirin and ibuprofen. These medicines can thin your blood. Do not take these medicines unless your health care provider tells you to take them. ? Taking over-the-counter medicines, vitamins, herbs, and supplements. General instructions  Follow instructions from your health care provider about eating or drinking restrictions.  Plan to have someone take you home from the hospital or clinic.  If you will be going home right after the procedure, plan to have someone with you for 24 hours.  Ask your health care provider what steps will be taken to help prevent infection. These may include washing your skin with a germ-killing soap. What happens during the procedure?   An IV will be inserted into one of your veins.  Sticky patches (electrodes) or metal paddles may be placed on your chest.  You will be given a medicine to help you relax (sedative).  An electrical shock will be delivered. The procedure may vary among health care providers and hospitals. What can I expect after the procedure?  Your blood pressure, heart rate, breathing rate, and blood oxygen level will be monitored until you leave the hospital or clinic.  Your heart rhythm will be watched to make sure it does not change.  You may have some redness on the skin where the shocks were given. Follow these instructions at home:  Do not drive for 24  hours if you were given a sedative during your procedure.  Take over-the-counter and prescription medicines only as told by your health care provider.  Ask your health care provider how to check your pulse. Check it often.  Rest for 48 hours after the procedure or as told by your health care provider.  Avoid or limit your caffeine use as told by your health care provider.  Keep all  follow-up visits as told by your health care provider. This is important. Contact a health care provider if:  You feel like your heart is beating too quickly or your pulse is not regular.  You have a serious muscle cramp that does not go away. Get help right away if:  You have discomfort in your chest.  You are dizzy or you feel faint.  You have trouble breathing or you are short of breath.  Your speech is slurred.  You have trouble moving an arm or leg on one side of your body.  Your fingers or toes turn cold or blue. Summary  Electrical cardioversion is the delivery of a jolt of electricity to restore a normal rhythm to the heart.  This procedure may be done right away in an emergency or may be a scheduled procedure if the condition is not an emergency.  Generally, this is a safe procedure.  After the procedure, check your pulse often as told by your health care provider. This information is not intended to replace advice given to you by your health care provider. Make sure you discuss any questions you have with your health care provider. Document Revised: 06/02/2019 Document Reviewed: 06/02/2019 Elsevier Patient Education  [employer].

## 2020-10-24 NOTE — Progress Notes (Signed)
Pharmacy: Dofetilide (Tikosyn) - Follow Up Assessment and Electrolyte Replacement  Pharmacy consulted to assist in monitoring and replacing electrolytes in this 86 y.o. female admitted on 10/19/2020 undergoing dofetilide initiation. First dofetilide dose: 12/7 @ 1944   Labs:    Component Value Date/Time   K 4.0 10/24/2020 0559   MG 1.8 10/23/2020 0725     Plan: Potassium: K >/= 4: No additional supplementation needed  Magnesium: Mg 1.3-1.7: Give Mg 4 gm IV x1  Due to the patient's potassium and magnesium requirements while inpatient, recommend discharging patient with prescription for:  Potassium chloride 40 mEq PO daily and magnesium oxide 400 mg PO BID  Thank you for allowing pharmacy to participate in this patient's care   Shauna Hugh, PharmD, [place]  PGY-1 Pharmacy Resident 10/24/2020 9:22 AM  Please check [email or web address] for unit-specific pharmacy phone numbers.

## 2020-10-24 NOTE — Progress Notes (Signed)
Pt's stable in NSR, DC home via wheelchair

## 2020-10-27 ENCOUNTER — Other Ambulatory Visit: Payer: Self-pay

## 2020-10-27 NOTE — Patient Outreach (Signed)
[facility]) Care Management  10/27/2020  [name] 17-Apr-1931 [id number]    EMMI-General Discharge RED ON EMMI ALERT Day # 1 Date: 10/26/2020 Red Alert Reason: "Scheduled follow-up? No"   Outreach attempt # 1 to patient. Spoke with patient who denies any acute issues or concerns at present. She reports she is doing and feeling well. Reviewed and addressed red alert with patient. Error in response. She confirms that she has discharge paperwork with appt info on it. Appointments were made prior to discharge home. She confirms she has transportation tog et to follow up appts. Patient states she has all her meds in the home and no issues or concerns regarding them. Advised patient that they would get one more automated EMMI-GENERAL post discharge calls to assess how they are doing following recent hospitalization and will receive a call from a nurse if any of their responses were abnormal. Patient voiced understanding and was appreciative of f/u call.    Plan: RN CM will close case at this time.   Enzo Montgomery, RN,BSN,CCM [facility] Telephonic Care Management Coordinator Direct Phone: [phone] Toll Free: [phone] Fax: [phone]

## 2020-10-29 ENCOUNTER — Encounter (HOSPITAL_COMMUNITY): Payer: Self-pay | Admitting: Nurse Practitioner

## 2020-10-29 ENCOUNTER — Ambulatory Visit (HOSPITAL_COMMUNITY)
Admission: RE | Admit: 2020-10-29 | Discharge: 2020-10-29 | Disposition: A | Discharge status: Home / Self Care | Payer: Medicare PPO | Source: Ambulatory Visit | Attending: Nurse Practitioner | Admitting: Nurse Practitioner

## 2020-10-29 ENCOUNTER — Other Ambulatory Visit: Payer: Self-pay

## 2020-10-29 ENCOUNTER — Ambulatory Visit (HOSPITAL_COMMUNITY): Payer: Medicare PPO | Admitting: Nurse Practitioner

## 2020-10-29 VITALS — BP 154/94 | HR 67 | Ht 66.5 in | Wt 174.8 lb

## 2020-10-29 DIAGNOSIS — Z8249 Family history of ischemic heart disease and other diseases of the circulatory system: Secondary | ICD-10-CM | POA: Insufficient documentation

## 2020-10-29 DIAGNOSIS — Z880 Allergy status to penicillin: Secondary | ICD-10-CM | POA: Diagnosis not present

## 2020-10-29 DIAGNOSIS — D6869 Other thrombophilia: Secondary | ICD-10-CM | POA: Diagnosis not present

## 2020-10-29 DIAGNOSIS — Z79890 Hormone replacement therapy (postmenopausal): Secondary | ICD-10-CM | POA: Diagnosis not present

## 2020-10-29 DIAGNOSIS — Z7984 Long term (current) use of oral hypoglycemic drugs: Secondary | ICD-10-CM | POA: Diagnosis not present

## 2020-10-29 DIAGNOSIS — R197 Diarrhea, unspecified: Secondary | ICD-10-CM | POA: Diagnosis not present

## 2020-10-29 DIAGNOSIS — I10 Essential (primary) hypertension: Secondary | ICD-10-CM | POA: Diagnosis not present

## 2020-10-29 DIAGNOSIS — I4819 Other persistent atrial fibrillation: Secondary | ICD-10-CM | POA: Insufficient documentation

## 2020-10-29 DIAGNOSIS — Z7901 Long term (current) use of anticoagulants: Secondary | ICD-10-CM | POA: Diagnosis not present

## 2020-10-29 DIAGNOSIS — Z79899 Other long term (current) drug therapy: Secondary | ICD-10-CM | POA: Diagnosis not present

## 2020-10-29 LAB — BASIC METABOLIC PANEL
Anion gap: 10 (ref 5–15)
BUN: 14 mg/dL (ref 8–23)
CO2: 27 mmol/L (ref 22–32)
Calcium: 9.6 mg/dL (ref 8.9–10.3)
Chloride: 103 mmol/L (ref 98–111)
Creatinine, Ser: 0.72 mg/dL (ref 0.44–1.00)
GFR, Estimated: >60 mL/min (ref >60)
Glucose, Bld: 174 mg/dL — ABNORMAL HIGH (ref 70–99)
Potassium: 4.8 mmol/L (ref 3.5–5.1)
Sodium: 140 mmol/L (ref 135–145)

## 2020-10-29 LAB — MAGNESIUM: Magnesium: 2.2 mg/dL (ref 1.7–2.4)

## 2020-10-29 NOTE — Progress Notes (Addendum)
Primary Care Physician: Lavone Orn, MD Cardiologist: [name] Pulmonologist: Dr. Elsworth Soho GI: Dr. Dolphus Jenny [name] is a 86 y.o. female with a h/o afib that has been on amiodarone since June of 2015. She has had many breakthrough events, last one in February with successful cardioversion. She does feel very weak in afib. She went into afib around 2 weeks ago and a RN came out to her house on Friday and documented the afib.  Dr. Rayann Heman was called and left instructions to increase amio to 200 mg bid and stop losartan. Apparently her BP was low on that day.  She is in now in the afib clinc and she is rate controlled and BP is stable. She reports around 2-3 weeks ago she first had 2 teeth pulled and then a shot of cortisone in her knee. She hasn't felt well since. She usually requires a cardioversion to restore SR.    F/u in afib clinic 7/15 after successful cardioversion. She is in SR today and feels improved. She is back to Amio 200 mg daily.  F/u in afib clinic, 11/25/19, with feelings of not being well and being out of rhythm x one week. She has had an UTI and just finished a round of doxycycline. EKG shows atrial flutter  variable rate at 110 bpm.CHA2DS2-VASc Score = 6, continues eliquis 5 mg bid. 1   F/u in  afib clinic, 12/16/18. She had a successful cardioversion but unfortunately felt like it only lasted one day. She is in afib in the 90's. Natasha will try to increase BB and keep amio  at 200 mg a little longer to see if she may return to [place].   F/u  in afib clinic, 01/15/20.  F/u cardioversion 2/17. She was kept on her higher dose of BB at time of cardioversion as she did covert out but with PAC's. She saw [name] one week out and was staying in [place]. She  was left on amiodarone 200 mg bid until f/u here. She called the office earlier in the week and c/o of feeling weak. EKG shows  Sinus brady at 48 bpm. Natasha feel her bradycardia is the reason for her weakness and will titrate meds down today.    F/u 01/22/20. She remains  in SR  in the low 60's but does not feel that much better.  Back on amiodarone 200 mg daily from  BID and lower dose of BB. Marland Kitchen She had some GI distress yesterday and recently had issues with UTI. She  denies urgency or frequency today.  F/u in afib clinic, 4/1, as pt has gone back into afib. Discussed with her Natasha feel that amiodarone is no longer effective and Natasha think it is best to stop drug at this point and rate control. If qt shortens off 3 month amiodarone wash out, currently at 512 ms then tikosyn may be an option down the line. She has multiple cardioversions with significant ERAF.  F/u in afib clinic, 4/14. She is feeling better off amiodarone and she is rate controlled in the 80's but would like to see her a little slower so will increase BB today. We can consider Tikosyn after  3 month amiodarone wash out if qt interval shortens.    She actually is feeling better and had the enegy to do more around her house and some light exercise. She is upset today as she had some diarrhea this am and had an accident in the clinic.Natasha feel this  is why her BP is higher today as she usually  does not have high BP. She feels it may the the plate of food her neighbor gave her last night. She is rate controlled at home in the 70's and 80's.   F/u in afib clinic, 6/29. She remains in rate controlled afib and states that she feels very good. She is exercising without any significant shortness of breath and is rate controlled. No excessive fluid retention. Natasha feel that amiodarone may have been contributing to fatigue and she feels better off drug. She remains on eliquis 5 mg bid for a CHA2DS2VASc score of 6.  F/u in afib clinic, 08/04/20. She  remains in rate controlled afib and initially tells me that she is exercising and feels great, but then tells me she  feels bad and would love to get back in rhythm. QT corrected around 430 ms. She has now been off amiodarone x 5 months.   F/u in afib  clinic, 10/19/20,  as pt wants to be admitted for Tikosyn initiation. She  states that she feels terrible in afib. She has been living afib for several months . Natasha cut back her BB yesterday as she is slow in SR when she returns to SR and she is  not quite as well rate controlled today. She has been on 50 mg metoprolol bid with nice rate control.   She failed amiodarone after taking for years  and it was stopped late Spring after multiple cardioversion that would only hold short term.  She  has been off drug for sufficient period of time to try Tikosyn Her qtc today is 480 ms, but in SR with amiodarone on board was 460 ms, EKg  in spring 2015 without amio was 439 ms.  No benadryl use.  No missed anticoagulation. Electrolytes ok for admission with crcl cal at 64 so she will qualify for 500 msc. Covid negative. + vaccines.  F/u in afib clinic, 10/29/20,  one week after hospital admit for Tikosyn. Her qt prolonged and she was discharged on 125 mcg bid. She  did require cardioversion. She states today she feels improved. Her qt is 500 ms. Natasha will need to recheck qt early next week.   Today, she denies symptoms of chest pain, shortness of breath, orthopnea, PND, lower extremity edema, dizziness, presyncope, syncope, or neurologic sequela.Perisistent cough. The patient is tolerating medications without difficulties and is otherwise without complaint today.   Past Medical History:  Diagnosis Date  . Acute bronchitis 11/15/2017  . ADENOCARCINOMA, BREAST 10/13/2010   Qualifier: Diagnosis of  By: Nils Pyle CMA (AAMA), Mearl Latin    . 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 Allergy    SEASONAL  . Anemia    years ago  . Anticoagulated by anticoagulation treatment - Eliquis 03/30/2014   Started May 2015   . Anxiety state 10/13/2010   Qualifier: Diagnosis of  By: Nils Pyle CMA ([place]), Mearl Latin    . Arthritis    "all my joints" (03/31/2014)  . Barrett's esophagus   . Breast cancer [facility])    s/p right breast  lumpectomy  . CARDIAC MURMUR 10/13/2010   Qualifier: Diagnosis of  By: Nils Pyle CMA ([place]), Mearl Latin    . 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 Cervical cancer ([place])   . Chronic cough 01/02/2017   GERD, hiatal hernia Doubt amio  . COLONIC POLYPS, ADENOMATOUS, HX OF 10/13/2010   Qualifier: Diagnosis of  By: Nils Pyle CMA (AAMA), Mearl Latin    . Degenerative disorder  of eye 07/25/2012  . Diabetes mellitus without complication ([place])   . Dilated cardiomyopathy ([place]) 08/27/2018  . DIVERTICULOSIS, COLON 10/13/2010   Qualifier: Diagnosis of  By: Nils Pyle CMA (AAMA), Mearl Latin    . DJD (degenerative joint disease) of knee   . Dysrhythmia    Afib  . Echocardiogram 07/2020    Echo 9/21: EF 70-75, moderate LVH, GR 1 DD, RVSP 40.5 (mildly elevated), normal RVSF, moderate LAE, mild RAE, mild MR, trivial AI  . EROSIVE ESOPHAGITIS 10/13/2010   Qualifier: Diagnosis of  By: Nils Pyle CMA ([place]), Mearl Latin    . Error, refractive, myopia 07/09/2012  . Essential hypertension 10/13/2010   Qualifier: Diagnosis of  By: Nils Pyle CMA ([place]), Mearl Latin    . Exotropia, intermittent 07/09/2012  . GERD 10/14/2010   Qualifier: Diagnosis of  By: Sharlett Iles MD Byrd Hesselbach GERD (gastroesophageal reflux disease)   . Glaucoma   . H/O hiatal hernia   . Heart murmur   . Hiatal hernia s/p robotic repair & fundoplication 4/92/0100 71/12/1973   Qualifier: Diagnosis of  By: Nils Pyle CMA (AAMA), Mearl Latin    . History of laser assisted in situ keratomileusis 07/25/2012  . Hx of transesophageal echocardiography (TEE) for monitoring    TEE (03/2014): No LAA clot, moderate LAE, core triatriatum type structure in RA with no stenosis, normal EF 55-60%, mild MR  . HYPERCHOLESTEROLEMIA 10/13/2010   Qualifier: Diagnosis of  By: Nils Pyle CMA ([place]), Mearl Latin    . Hypertension   . Hypothyroidism, postsurgical   . Iron Deficiency Anemia 10/14/2010   Qualifier: Diagnosis of  By: Sharlett Iles MD Byrd Hesselbach   . Irritable bowel syndrome 10/13/2010   Qualifier: Diagnosis of  By:  Nils Pyle CMA (AAMA), Mearl Latin    . Lumbar stenosis with neurogenic claudication 04/20/2016  . Malignant neoplasm of breast ([place]) 02/25/2016  . Migraine    "last one was years ago" (03/31/2014)  . MIGRAINE HEADACHE 10/13/2010   Qualifier: Diagnosis of  By: Nils Pyle CMA ([place]), Mearl Latin    . 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 OSA on CPAP    settings at 10-11   . OSTEOARTHRITIS 10/13/2010   Qualifier: Diagnosis of  By: Nils Pyle CMA ([place]), Mearl Latin    . Persistent atrial fibrillation 03/31/2015   DLCO dropped from 100% in 2015-50% in 01/2017  . Post-surgical hypothyroidism 04/09/2014  . RECTAL FISSURE 10/13/2010   Qualifier: Diagnosis of  By: Nils Pyle CMA ([place]), Mearl Latin    . S/P Nissen fundoplication (without gastrostomy tube) procedure 05/25/2017  . Sleep apnea   . THYROID CANCER 10/13/2010   Qualifier: Diagnosis of  By: Nils Pyle CMA ([place]), Mearl Latin    . Thyroid cancer [facility])    s/p thyroidectomy  . Type II diabetes mellitus ([place])    type II    Past Surgical History:  Procedure Laterality Date  . ABDOMINAL HYSTERECTOMY     "partial"  . BREAST BIOPSY Right   . BREAST LUMPECTOMY Right   . CARDIOVERSION N/A 03/30/2014   Procedure: CARDIOVERSION - BEDSIDE;  Surgeon: [name], MD;  Location: [place];  Service: Cardiovascular;  Laterality: N/A;  . CARDIOVERSION N/A 03/31/2014   Procedure: CARDIOVERSION  (BEDSIDE) ;  Surgeon: Lelon Perla, MD;  Location: [place];  Service: Cardiovascular;  Laterality: N/A;  . CARDIOVERSION N/A 04/27/2014   Procedure: CARDIOVERSION;  Surgeon: Darlin Coco, MD;  Location: [facility] ENDOSCOPY;  Service: Cardiovascular;  Laterality: N/A;  . CARDIOVERSION N/A 04/13/2015   Procedure: CARDIOVERSION;  Surgeon:  Thayer Headings, MD;  Location: [place];  Service: Cardiovascular;  Laterality: N/A;  . CARDIOVERSION N/A 01/08/2017   Procedure: CARDIOVERSION;  Surgeon: Sanda Klein, MD;  Location: [facility] ENDOSCOPY;  Service: Cardiovascular;  Laterality: N/A;  . CARDIOVERSION  N/A 09/19/2017   Procedure: CARDIOVERSION;  Surgeon: [name], MD;  Location: [place];  Service: Cardiovascular;  Laterality: N/A;  . CARDIOVERSION N/A 10/25/2017   Procedure: CARDIOVERSION;  Surgeon: Thayer Headings, MD;  Location: [place];  Service: Cardiovascular;  Laterality: N/A;  . CARDIOVERSION N/A 12/27/2018   Procedure: CARDIOVERSION;  Surgeon: [name], MD;  Location: [place];  Service: Cardiovascular;  Laterality: N/A;  . CARDIOVERSION N/A 05/20/2019   Procedure: CARDIOVERSION;  Surgeon: [name], MD;  Location: [facility] ENDOSCOPY;  Service: Cardiovascular;  Laterality: N/A;  . CARDIOVERSION N/A 12/10/2019   Procedure: CARDIOVERSION;  Surgeon: Pixie Casino, MD;  Location: [facility] ENDOSCOPY;  Service: Cardiovascular;  Laterality: N/A;  . CARDIOVERSION N/A 12/31/2019   Procedure: CARDIOVERSION;  Surgeon: Sanda Klein, MD;  Location: [facility] ENDOSCOPY;  Service: Cardiovascular;  Laterality: N/A;  . CARDIOVERSION N/A 10/21/2020   Procedure: CARDIOVERSION;  Surgeon: [name], MD;  Location: [facility] ENDOSCOPY;  Service: Cardiovascular;  Laterality: N/A;  . CARPAL TUNNEL RELEASE Right   . CATARACT EXTRACTION W/ INTRAOCULAR LENS  IMPLANT, BILATERAL Bilateral   . COLONOSCOPY    . EXCISIONAL HEMORRHOIDECTOMY    . INSERTION OF MESH N/A 05/25/2017   Procedure: INSERTION OF MESH;  Surgeon: Ralene Ok, MD;  Location: [facility] ORS;  Service: General;  Laterality: N/A;  . TEE WITHOUT CARDIOVERSION N/A 03/30/2014   Procedure: TRANSESOPHAGEAL ECHOCARDIOGRAM (TEE);  Surgeon: [name], MD;  Location: [facility] ENDOSCOPY;  Service: Cardiovascular;  Laterality: N/A;  . THYROIDECTOMY    . TONSILLECTOMY      Current Outpatient Medications  Medication Sig Dispense Refill  . ACCU-CHEK GUIDE test strip     . acetaminophen (TYLENOL) 650 MG CR tablet Take 650 mg by mouth in the morning, at noon, and at bedtime.    . B Complex-C-Biotin-D-FA (DIALYVITE 800 PLUS D) 800 MCG WAFR  See admin instructions.    . Blood Glucose Monitoring Suppl (ACCU-CHEK GUIDE ME) w/Device KIT     . Calcium Citrate (CITRACAL PO) Take 800 mg by mouth daily.     . cetirizine (ZYRTEC) 10 MG tablet Take 10 mg by mouth daily.    . Cholecalciferol (DIALYVITE VITAMIN D 5000) 125 MCG (5000 UT) capsule Take 5,000 Units by mouth daily.    Marland Kitchen dofetilide (TIKOSYN) 125 MCG capsule Take 1 capsule (125 mcg total) by mouth 2 (two) times daily. 60 capsule 11  . ELIQUIS 5 MG TABS tablet TAKE 1 TABLET TWICE DAILY (Patient taking differently: Take 5 mg by mouth 2 (two) times daily.) 180 tablet 1  . ferrous sulfate 325 (65 FE) MG tablet Take 325 mg by mouth daily with breakfast.    . fluticasone (FLONASE) 50 MCG/ACT nasal spray Place 1 spray into both nostrils at bedtime as needed for allergies.     Marland Kitchen lansoprazole (PREVACID) 30 MG capsule Take 30 mg by mouth daily as needed (acid reflux).     Marland Kitchen levothyroxine (SYNTHROID) 112 MCG tablet Take 112 mcg by mouth daily before breakfast.     . LORazepam (ATIVAN) 1 MG tablet Take 1.5 mg by mouth at bedtime.     . Lutein 6 MG CAPS Take 6 mg by mouth daily.     . magnesium oxide (MAG-OX)  400 (241.3 Mg) MG tablet Take 1 tablet (400 mg total) by mouth 2 (two) times daily. 60 tablet 3  . metFORMIN (GLUCOPHAGE-XR) 500 MG 24 hr tablet Take 225 mg by mouth 2 (two) times daily.    . metoprolol tartrate (LOPRESSOR) 25 MG tablet Take 1 tablet (25 mg total) by mouth 2 (two) times daily. 60 tablet 5  . Multiple Vitamins-Minerals (PRESERVISION AREDS 2) CAPS Take 1 capsule by mouth daily.    . Multiple Vitamins-Minerals (ZINC PO) Take 0.5 tablets by mouth 2 (two) times a week.     . Polyvinyl Alcohol-Povidone PF 1.4-0.6 % SOLN Place 1 drop into both eyes daily as needed (dry eyes).     . potassium chloride SA (KLOR-CON) 20 MEQ tablet Take 2 tablets (40 mEq total) by mouth daily. 90 tablet 1  . rosuvastatin (CRESTOR) 10 MG tablet Take 10 mg by mouth at bedtime.    . sertraline (ZOLOFT) 50  MG tablet Take 50 mg by mouth daily.    . vitamin B-12 (CYANOCOBALAMIN) 1000 MCG tablet Take 1,000 mcg by mouth daily.     No current facility-administered medications for this encounter.    Allergies  Allergen Reactions  . Penicillins Itching, Rash and Other (See Comments)    Did it involve swelling of the face/tongue/throat, SOB, or low BP? No Did it involve sudden or severe rash/hives, skin peeling, or any reaction on the inside of your mouth or nose? No Did you need to seek medical attention at a hospital or doctor's office? No When did it last happen?30+ years If all above answers are "NO", may proceed with cephalosporin use.     Social History   Socioeconomic History  . Marital status: Divorced    Spouse name: Not on file  . Number of children: 2  . Years of education: Not on file  . Highest education level: Not on file  Occupational History  . Occupation: Retired    Comment: Education officer, [employer]  Tobacco Use  . Smoking status: Never Smoker  . Smokeless tobacco: Never Used  Vaping Use  . Vaping Use: Never used  Substance and Sexual Activity  . Alcohol use: No    Alcohol/week: 0.0 standard drinks  . Drug use: No  . Sexual activity: Never  Other Topics Concern  . Not on file  Social History Narrative  . Not on file   Social Determinants of Health   Financial Resource Strain: Not on file  Food Insecurity: Not on file  Transportation Needs: Not on file  Physical Activity: Not on file  Stress: Not on file  Social Connections: Not on file  Intimate Partner Violence: Not on file    Family History  Problem Relation Age of Onset  . Prostate cancer Father   . Diabetes Sister   . Heart disease Sister   . Heart attack Brother   . Heart disease Brother   . Diabetes Brother   . Diabetes Sister   . Diabetes Sister   . Pancreatic cancer Sister   . Diabetes Brother   . Heart disease Brother   . Diabetes Brother   . Heart disease Brother   . Diabetes Brother    . Heart disease Brother   . Diabetes Brother   . Heart disease Brother   . Diabetes Brother   . Heart disease Brother   . Diabetes Brother   . Diabetes Child     ROS- All systems are reviewed and negative except as per the HPI  above  Physical Exam: Vitals:   10/29/20 1152  BP: (!) 154/94  Pulse: 67  Weight: 79.3 kg  Height: 5' 6.5" (1.689 m)   Wt Readings from Last 3 Encounters:  10/29/20 79.3 kg  10/23/20 78.9 kg  10/19/20 80.6 kg    Labs: Lab Results  Component Value Date   NA 138 10/24/2020   K 4.0 10/24/2020   CL 104 10/24/2020   CO2 25 10/24/2020   GLUCOSE 149 (H) 10/24/2020   BUN 15 10/24/2020   CREATININE 0.61 10/24/2020   CALCIUM 8.6 (L) 10/24/2020   MG 1.7 10/24/2020   Lab Results  Component Value Date   INR 1.2 10/21/2020   No results found for: CHOL, HDL, LDLCALC, TRIG   GEN- The patient is well appearing, alert and oriented x 3 today.   Head- normocephalic, atraumatic Eyes-  Sclera clear, conjunctiva pink Ears- hearing intact Oropharynx- clear Neck- supple, no JVP Lymph- no cervical lymphadenopathy Lungs- Clear to ausculation bilaterally, normal work of breathing Heart regular rate and rhythm, no murmurs, rubs or gallops, PMI not laterally displaced GI- soft, NT, ND, + BS Extremities- no clubbing, cyanosis, or edema MS- no significant deformity or atrophy Skin- no rash or lesion Psych- euthymic mood, full affect Neuro- strength and sensation are intact  EKG- NSR at 67 bpm, PR int 182 ms, qrs int 88 ms, qtc 500 ms Epic records reviewed   Assessment and Plan: 1. Persistent afib Multiple cardioversion's in the past 1-2 years Last cardioversion, 12/31/19, was successful but  with significant  weakness 2/2 bradycardia Sinus brady resolved with reduction of BB and amiodarone reduced  back to 200 mg qd  However returned to  afib  Discussed on previous visit in April,  Natasha felt that amiodarone was  no longer effective to maintain SR and Natasha  could  not keep her at 200 mg bid as she may get toxic on it. Natasha thought  it was best to stop drug and rate control  She has had good rate control in afib but really desired to get back in SR, has a lot of fatigue," feels terrible"  We discussed Tikosyn and she really wanted to pursue, qtc appears to be  slightly prolonged in afib today, but appeared better in SR with amio on board at 460 ms  She  was able to get on Tikosyn at 125 mcg bid but qt today is borderline prolonged and this will need to be rechecked with ekg early next week  2. HTN Stable for pt   3. CHA2DS2VASc score of 6 Eliquis 5 mg bid    F/u for ekg 12/21  Butch Penny C. Sudie Bandel, [facility] [address] [place], [place] [address] [phone]        Primary Care Physician: Lavone Orn, MD Cardiologist: [name] Pulmonologist: Dr. Elsworth Soho GI: Dr. Dolphus Jenny Natasha Cella is a 86 y.o. female with a h/o afib that has been on amiodarone since June of 2015. She has had many breakthrough events, last one in February with successful cardioversion. She does feel very weak in afib. She went into afib around 2 weeks ago and a RN came out to her house on Friday and documented the afib.  Dr. Rayann Heman was called and left instructions to increase amio to 200 mg bid and stop losartan. Apparently her BP was low on that day.  She is in now in the afib clinc and she is rate controlled and BP  is stable. She reports around 2-3 weeks ago she first had 2 teeth pulled and then a shot of cortisone in her knee. She hasn't felt well since. She usually requires a cardioversion to restore SR.    F/u in afib clinic 7/15 after successful cardioversion. She is in SR today and feels improved. She is back to Amio 200 mg daily.  F/u in afib clinic, 11/25/19, with feelings of not being well and being out of rhythm x one week. She has had an UTI and just finished a round of doxycycline. EKG shows atrial flutter  variable rate  at 110 bpm.CHA2DS2-VASc Score = 6, continues eliquis 5 mg bid. 1   F/u in  afib clinic, 12/16/18. She had a successful cardioversion but unfortunately felt like it only lasted one day. She is in afib in the 90's. Natasha will try to increase BB and keep amio  at 200 mg a little longer to see if she may return to [place].   F/u  in afib clinic, 01/15/20.  F/u cardioversion 2/17. She was kept on her higher dose of BB at time of cardioversion as she did covert out but with PAC's. She saw [name] one week out and was staying in [place]. She  was left on amiodarone 200 mg bid until f/u here. She called the office earlier in the week and c/o of feeling weak. EKG shows  Sinus brady at 48 bpm. Natasha feel her bradycardia is the reason for her weakness and will titrate meds down today.   F/u 01/22/20. She remains  in SR  in the low 60's but does not feel that much better.  Back on amiodarone 200 mg daily from  BID and lower dose of BB. Marland Kitchen She had some GI distress yesterday and recently had issues with UTI. She  denies urgency or frequency today.  F/u in afib clinic, 4/1, as pt has gone back into afib. Discussed with her Natasha feel that amiodarone is no longer effective and Natasha think it is best to stop drug at this point and rate control. If qt shortens off 3 month amiodarone wash out, currently at 512 ms then tikosyn may be an option down the line. She has multiple cardioversions with significant ERAF.  F/u in afib clinic, 4/14. She is feeling better off amiodarone and she is rate controlled in the 80's but would like to see her a little slower so will increase BB today. We can consider Tikosyn after  3 month amiodarone wash out if qt interval shortens.    She actually is feeling better and had the enegy to do more around her house and some light exercise. She is upset today as she had some diarrhea this am and had an accident in the clinic.Natasha feel this is why her BP is higher today as she usually  does not have high BP. She feels it may the  the plate of food her neighbor gave her last night. She is rate controlled at home in the 70's and 80's.   F/u in afib clinic, 6/29. She remains in rate controlled afib and states that she feels very good. She is exercising without any significant shortness of breath and is rate controlled. No excessive fluid retention. Natasha feel that amiodarone may have been contributing to fatigue and she feels better off drug. She remains on eliquis 5 mg bid for a CHA2DS2VASc score of 6.  Today, she denies symptoms of chest pain, shortness of breath, orthopnea, PND, lower  extremity edema, dizziness, presyncope, syncope, or neurologic sequela.Perisistent cough. The patient is tolerating medications without difficulties and is otherwise without complaint today.   Past Medical History:  Diagnosis Date  . Acute bronchitis 11/15/2017  . ADENOCARCINOMA, BREAST 10/13/2010   Qualifier: Diagnosis of  By: Nils Pyle CMA (AAMA), Mearl Latin    . 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 Allergy    SEASONAL  . Anemia    years ago  . Anticoagulated by anticoagulation treatment - Eliquis 03/30/2014   Started May 2015   . Anxiety state 10/13/2010   Qualifier: Diagnosis of  By: Nils Pyle CMA ([place]), Mearl Latin    . Arthritis    "all my joints" (03/31/2014)  . Barrett's esophagus   . Breast cancer [facility])    s/p right breast lumpectomy  . CARDIAC MURMUR 10/13/2010   Qualifier: Diagnosis of  By: Nils Pyle CMA ([place]), Mearl Latin    . 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 Cervical cancer ([place])   . Chronic cough 01/02/2017   GERD, hiatal hernia Doubt amio  . COLONIC POLYPS, ADENOMATOUS, HX OF 10/13/2010   Qualifier: Diagnosis of  By: Nils Pyle CMA (AAMA), Mearl Latin    . Degenerative disorder of eye 07/25/2012  . Diabetes mellitus without complication ([place])   . Dilated cardiomyopathy ([place]) 08/27/2018  . DIVERTICULOSIS, COLON 10/13/2010   Qualifier: Diagnosis of  By: Nils Pyle CMA (AAMA), Mearl Latin    . DJD (degenerative joint disease) of knee   .  Dysrhythmia    Afib  . Echocardiogram 07/2020    Echo 9/21: EF 70-75, moderate LVH, GR 1 DD, RVSP 40.5 (mildly elevated), normal RVSF, moderate LAE, mild RAE, mild MR, trivial AI  . EROSIVE ESOPHAGITIS 10/13/2010   Qualifier: Diagnosis of  By: Nils Pyle CMA ([place]), Mearl Latin    . Error, refractive, myopia 07/09/2012  . Essential hypertension 10/13/2010   Qualifier: Diagnosis of  By: Nils Pyle CMA ([place]), Mearl Latin    . Exotropia, intermittent 07/09/2012  . GERD 10/14/2010   Qualifier: Diagnosis of  By: Sharlett Iles MD Byrd Hesselbach GERD (gastroesophageal reflux disease)   . Glaucoma   . H/O hiatal hernia   . Heart murmur   . Hiatal hernia s/p robotic repair & fundoplication 07/29/9449 38/06/8279   Qualifier: Diagnosis of  By: Nils Pyle CMA (AAMA), Mearl Latin    . History of laser assisted in situ keratomileusis 07/25/2012  . Hx of transesophageal echocardiography (TEE) for monitoring    TEE (03/2014): No LAA clot, moderate LAE, core triatriatum type structure in RA with no stenosis, normal EF 55-60%, mild MR  . HYPERCHOLESTEROLEMIA 10/13/2010   Qualifier: Diagnosis of  By: Nils Pyle CMA ([place]), Mearl Latin    . Hypertension   . Hypothyroidism, postsurgical   . Iron Deficiency Anemia 10/14/2010   Qualifier: Diagnosis of  By: Sharlett Iles MD Byrd Hesselbach   . Irritable bowel syndrome 10/13/2010   Qualifier: Diagnosis of  By: Nils Pyle CMA (AAMA), Mearl Latin    . Lumbar stenosis with neurogenic claudication 04/20/2016  . Malignant neoplasm of breast ([place]) 02/25/2016  . Migraine    "last one was years ago" (03/31/2014)  . MIGRAINE HEADACHE 10/13/2010   Qualifier: Diagnosis of  By: Nils Pyle CMA ([place]), Mearl Latin    . 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 OSA on CPAP    settings at 10-11   . OSTEOARTHRITIS 10/13/2010   Qualifier: Diagnosis of  By: Nils Pyle CMA ([place]), Mearl Latin    . Persistent atrial fibrillation 03/31/2015   DLCO dropped from 100% in  2015-50% in 01/2017  . Post-surgical hypothyroidism 04/09/2014  . RECTAL  FISSURE 10/13/2010   Qualifier: Diagnosis of  By: Nils Pyle CMA ([place]), Mearl Latin    . S/P Nissen fundoplication (without gastrostomy tube) procedure 05/25/2017  . Sleep apnea   . THYROID CANCER 10/13/2010   Qualifier: Diagnosis of  By: Nils Pyle CMA ([place]), Mearl Latin    . Thyroid cancer [facility])    s/p thyroidectomy  . Type II diabetes mellitus ([place])    type II    Past Surgical History:  Procedure Laterality Date  . ABDOMINAL HYSTERECTOMY     "partial"  . BREAST BIOPSY Right   . BREAST LUMPECTOMY Right   . CARDIOVERSION N/A 03/30/2014   Procedure: CARDIOVERSION - BEDSIDE;  Surgeon: [name], MD;  Location: [place];  Service: Cardiovascular;  Laterality: N/A;  . CARDIOVERSION N/A 03/31/2014   Procedure: CARDIOVERSION  (BEDSIDE) ;  Surgeon: Lelon Perla, MD;  Location: [place];  Service: Cardiovascular;  Laterality: N/A;  . CARDIOVERSION N/A 04/27/2014   Procedure: CARDIOVERSION;  Surgeon: Darlin Coco, MD;  Location: [place];  Service: Cardiovascular;  Laterality: N/A;  . CARDIOVERSION N/A 04/13/2015   Procedure: CARDIOVERSION;  Surgeon: Thayer Headings, MD;  Location: [place];  Service: Cardiovascular;  Laterality: N/A;  . CARDIOVERSION N/A 01/08/2017   Procedure: CARDIOVERSION;  Surgeon: Sanda Klein, MD;  Location: [facility] ENDOSCOPY;  Service: Cardiovascular;  Laterality: N/A;  . CARDIOVERSION N/A 09/19/2017   Procedure: CARDIOVERSION;  Surgeon: [name], MD;  Location: [place];  Service: Cardiovascular;  Laterality: N/A;  . CARDIOVERSION N/A 10/25/2017   Procedure: CARDIOVERSION;  Surgeon: Acie Fredrickson Wonda Cheng, MD;  Location: [place];  Service: Cardiovascular;  Laterality: N/A;  . CARDIOVERSION N/A 12/27/2018   Procedure: CARDIOVERSION;  Surgeon: [name], MD;  Location: [place];  Service: Cardiovascular;  Laterality: N/A;  . CARDIOVERSION N/A 05/20/2019   Procedure: CARDIOVERSION;  Surgeon: [name], MD;  Location: [facility] ENDOSCOPY;  Service:  Cardiovascular;  Laterality: N/A;  . CARDIOVERSION N/A 12/10/2019   Procedure: CARDIOVERSION;  Surgeon: Pixie Casino, MD;  Location: [facility] ENDOSCOPY;  Service: Cardiovascular;  Laterality: N/A;  . CARDIOVERSION N/A 12/31/2019   Procedure: CARDIOVERSION;  Surgeon: Sanda Klein, MD;  Location: [facility] ENDOSCOPY;  Service: Cardiovascular;  Laterality: N/A;  . CARDIOVERSION N/A 10/21/2020   Procedure: CARDIOVERSION;  Surgeon: [name], MD;  Location: [facility] ENDOSCOPY;  Service: Cardiovascular;  Laterality: N/A;  . CARPAL TUNNEL RELEASE Right   . CATARACT EXTRACTION W/ INTRAOCULAR LENS  IMPLANT, BILATERAL Bilateral   . COLONOSCOPY    . EXCISIONAL HEMORRHOIDECTOMY    . INSERTION OF MESH N/A 05/25/2017   Procedure: INSERTION OF MESH;  Surgeon: Ralene Ok, MD;  Location: [facility];  Service: General;  Laterality: N/A;  . TEE WITHOUT CARDIOVERSION N/A 03/30/2014   Procedure: TRANSESOPHAGEAL ECHOCARDIOGRAM (TEE);  Surgeon: [name], MD;  Location: [facility] ENDOSCOPY;  Service: Cardiovascular;  Laterality: N/A;  . THYROIDECTOMY    . TONSILLECTOMY      Current Outpatient Medications  Medication Sig Dispense Refill  . ACCU-CHEK GUIDE test strip     . acetaminophen (TYLENOL) 650 MG CR tablet Take 650 mg by mouth in the morning, at noon, and at bedtime.    . B Complex-C-Biotin-D-FA (DIALYVITE 800 PLUS D) 800 MCG WAFR See admin instructions.    . Blood Glucose Monitoring Suppl (ACCU-CHEK GUIDE ME) w/Device KIT     . Calcium Citrate (CITRACAL PO) Take 800 mg by mouth daily.     Marland Kitchen  cetirizine (ZYRTEC) 10 MG tablet Take 10 mg by mouth daily.    . Cholecalciferol (DIALYVITE VITAMIN D 5000) 125 MCG (5000 UT) capsule Take 5,000 Units by mouth daily.    Marland Kitchen dofetilide (TIKOSYN) 125 MCG capsule Take 1 capsule (125 mcg total) by mouth 2 (two) times daily. 60 capsule 11  . ELIQUIS 5 MG TABS tablet TAKE 1 TABLET TWICE DAILY (Patient taking differently: Take 5 mg by mouth 2 (two) times daily.) 180 tablet 1  .  ferrous sulfate 325 (65 FE) MG tablet Take 325 mg by mouth daily with breakfast.    . fluticasone (FLONASE) 50 MCG/ACT nasal spray Place 1 spray into both nostrils at bedtime as needed for allergies.     Marland Kitchen lansoprazole (PREVACID) 30 MG capsule Take 30 mg by mouth daily as needed (acid reflux).     Marland Kitchen levothyroxine (SYNTHROID) 112 MCG tablet Take 112 mcg by mouth daily before breakfast.     . LORazepam (ATIVAN) 1 MG tablet Take 1.5 mg by mouth at bedtime.     . Lutein 6 MG CAPS Take 6 mg by mouth daily.     . magnesium oxide (MAG-OX) 400 (241.3 Mg) MG tablet Take 1 tablet (400 mg total) by mouth 2 (two) times daily. 60 tablet 3  . metFORMIN (GLUCOPHAGE-XR) 500 MG 24 hr tablet Take 225 mg by mouth 2 (two) times daily.    . metoprolol tartrate (LOPRESSOR) 25 MG tablet Take 1 tablet (25 mg total) by mouth 2 (two) times daily. 60 tablet 5  . Multiple Vitamins-Minerals (PRESERVISION AREDS 2) CAPS Take 1 capsule by mouth daily.    . Multiple Vitamins-Minerals (ZINC PO) Take 0.5 tablets by mouth 2 (two) times a week.     . Polyvinyl Alcohol-Povidone PF 1.4-0.6 % SOLN Place 1 drop into both eyes daily as needed (dry eyes).     . potassium chloride SA (KLOR-CON) 20 MEQ tablet Take 2 tablets (40 mEq total) by mouth daily. 90 tablet 1  . rosuvastatin (CRESTOR) 10 MG tablet Take 10 mg by mouth at bedtime.    . sertraline (ZOLOFT) 50 MG tablet Take 50 mg by mouth daily.    . vitamin B-12 (CYANOCOBALAMIN) 1000 MCG tablet Take 1,000 mcg by mouth daily.     No current facility-administered medications for this encounter.    Allergies  Allergen Reactions  . Penicillins Itching, Rash and Other (See Comments)    Did it involve swelling of the face/tongue/throat, SOB, or low BP? No Did it involve sudden or severe rash/hives, skin peeling, or any reaction on the inside of your mouth or nose? No Did you need to seek medical attention at a hospital or doctor's office? No When did it last happen?30+ years If  all above answers are "NO", may proceed with cephalosporin use.     Social History   Socioeconomic History  . Marital status: Divorced    Spouse name: Not on file  . Number of children: 2  . Years of education: Not on file  . Highest education level: Not on file  Occupational History  . Occupation: Retired    Comment: Education officer, [employer]  Tobacco Use  . Smoking status: Never Smoker  . Smokeless tobacco: Never Used  Vaping Use  . Vaping Use: Never used  Substance and Sexual Activity  . Alcohol use: No    Alcohol/week: 0.0 standard drinks  . Drug use: No  . Sexual activity: Never  Other Topics Concern  . Not on file  Social History Narrative  . Not on file   Social Determinants of Health   Financial Resource Strain: Not on file  Food Insecurity: Not on file  Transportation Needs: Not on file  Physical Activity: Not on file  Stress: Not on file  Social Connections: Not on file  Intimate Partner Violence: Not on file    Family History  Problem Relation Age of Onset  . Prostate cancer Father   . Diabetes Sister   . Heart disease Sister   . Heart attack Brother   . Heart disease Brother   . Diabetes Brother   . Diabetes Sister   . Diabetes Sister   . Pancreatic cancer Sister   . Diabetes Brother   . Heart disease Brother   . Diabetes Brother   . Heart disease Brother   . Diabetes Brother   . Heart disease Brother   . Diabetes Brother   . Heart disease Brother   . Diabetes Brother   . Heart disease Brother   . Diabetes Brother   . Diabetes Child     ROS- All systems are reviewed and negative except as per the HPI above  Physical Exam: Vitals:   10/29/20 1152  BP: (!) 154/94  Pulse: 67  Weight: 79.3 kg  Height: 5' 6.5" (1.689 m)   Wt Readings from Last 3 Encounters:  10/29/20 79.3 kg  10/23/20 78.9 kg  10/19/20 80.6 kg    Labs: Lab Results  Component Value Date   NA 138 10/24/2020   K 4.0 10/24/2020   CL 104 10/24/2020   CO2 25 10/24/2020    GLUCOSE 149 (H) 10/24/2020   BUN 15 10/24/2020   CREATININE 0.61 10/24/2020   CALCIUM 8.6 (L) 10/24/2020   MG 1.7 10/24/2020   Lab Results  Component Value Date   INR 1.2 10/21/2020   No results found for: CHOL, HDL, LDLCALC, TRIG   GEN- The patient is well appearing, alert and oriented x 3 today.   Head- normocephalic, atraumatic Eyes-  Sclera clear, conjunctiva pink Ears- hearing intact Oropharynx- clear Neck- supple, no JVP Lymph- no cervical lymphadenopathy Lungs- Clear to ausculation bilaterally, normal work of breathing Heart- irregular rate and rhythm, no murmurs, rubs or gallops, PMI not laterally displaced GI- soft, NT, ND, + BS Extremities- no clubbing, cyanosis, or edema MS- no significant deformity or atrophy Skin- no rash or lesion Psych- euthymic mood, full affect Neuro- strength and sensation are intact  EKG- a flutter  at 98 bpm,  qrs int 100 ms, qt int 449   ms  Epic records reviewed   Assessment and Plan: 1. Persistent afib Multiple cardioversion's in the past  Last cardioversion, 2/17, was successful but  with significant  weakness 2/2 bradycardia Sinus brady resolved with reduction of BB and amiodarone redcued  back to 200 mg qd  However returned to  afib  Discussed on previous  visit  with pt Natasha feel that amiodarone is no longer effective to maintain SR and Natasha can not keep her at 200 mg bid as she may get toxic on it. Natasha think it is best to stop drug and rate control  Continue metoprolol to 25 (1 1/2 mg)  bid  She actually is feeling better off amiodarone and being  rate controlled  She may be a Tikosyn candidate after 3 month amio washout if qt shortens and she does not tolerate rate control    2. HTN Stable   3. CHA2DS2VASc score of 6 Eliquis  5 mg bid   F/u in afib clinic in 3 months    Butch Penny C. , [facility] [address] [place], [place] [address] [phone]

## 2020-11-02 ENCOUNTER — Other Ambulatory Visit: Payer: Self-pay

## 2020-11-02 ENCOUNTER — Ambulatory Visit (HOSPITAL_COMMUNITY)
Admission: RE | Admit: 2020-11-02 | Discharge: 2020-11-02 | Disposition: A | Discharge status: Home / Self Care | Payer: Medicare PPO | Source: Ambulatory Visit | Attending: Nurse Practitioner | Admitting: Nurse Practitioner

## 2020-11-02 ENCOUNTER — Encounter (HOSPITAL_COMMUNITY): Payer: Self-pay | Admitting: Nurse Practitioner

## 2020-11-02 VITALS — BP 134/80 | HR 71

## 2020-11-02 DIAGNOSIS — I4819 Other persistent atrial fibrillation: Secondary | ICD-10-CM | POA: Insufficient documentation

## 2020-11-02 DIAGNOSIS — D6869 Other thrombophilia: Secondary | ICD-10-CM | POA: Diagnosis not present

## 2020-11-02 NOTE — Progress Notes (Signed)
[name] returned today for further look at her qtc. Last week it was 500 ms. Today it is 489 ms . She continues in SR at 71 bpm, pr int 184 ms, qrs int 82 ms, qtc 489 ms.She is very happy to be able to stay on tikosyn 125 mcg bid to maintain SR. BP 134/80. F/u with Dr. Rayann Heman  in January as scheduled.

## 2020-11-23 ENCOUNTER — Telehealth (HOSPITAL_COMMUNITY): Payer: Self-pay | Admitting: *Deleted

## 2020-11-23 NOTE — Telephone Encounter (Signed)
Pt called in stating she went back into afib sometime within the last week. HR 106-130 BP is running in the 86-92/50s. She feels fine just tired denies dizziness, shortness of breath or chest pain. She is under a lot of stress due to illness in her family. She will call me back Monday with update of symptoms as patient is unable to come to the office this week for assessment due to no transportation. ER precations reviewed with patient.

## 2020-11-26 ENCOUNTER — Other Ambulatory Visit: Payer: Self-pay

## 2020-11-26 ENCOUNTER — Encounter (HOSPITAL_COMMUNITY): Payer: Self-pay | Admitting: Nurse Practitioner

## 2020-11-26 ENCOUNTER — Ambulatory Visit (HOSPITAL_COMMUNITY)
Admission: RE | Admit: 2020-11-26 | Discharge: 2020-11-26 | Disposition: A | Discharge status: Home / Self Care | Payer: Medicare PPO | Source: Ambulatory Visit | Attending: Nurse Practitioner | Admitting: Nurse Practitioner

## 2020-11-26 ENCOUNTER — Other Ambulatory Visit (HOSPITAL_COMMUNITY): Payer: Self-pay | Admitting: *Deleted

## 2020-11-26 VITALS — BP 138/94 | HR 132 | Ht 66.5 in | Wt 175.0 lb

## 2020-11-26 DIAGNOSIS — Z7901 Long term (current) use of anticoagulants: Secondary | ICD-10-CM | POA: Insufficient documentation

## 2020-11-26 DIAGNOSIS — D6869 Other thrombophilia: Secondary | ICD-10-CM

## 2020-11-26 DIAGNOSIS — I4819 Other persistent atrial fibrillation: Secondary | ICD-10-CM | POA: Insufficient documentation

## 2020-11-26 DIAGNOSIS — I10 Essential (primary) hypertension: Secondary | ICD-10-CM | POA: Diagnosis not present

## 2020-11-26 DIAGNOSIS — I4892 Unspecified atrial flutter: Secondary | ICD-10-CM | POA: Diagnosis not present

## 2020-11-26 LAB — CBC
HCT: 39.9 % (ref 36.0–46.0)
Hemoglobin: 13.6 g/dL (ref 12.0–15.0)
MCH: 32.9 pg (ref 26.0–34.0)
MCHC: 34.1 g/dL (ref 30.0–36.0)
MCV: 96.4 fL (ref 80.0–100.0)
Platelets: 185 10*3/uL (ref 150–400)
RBC: 4.14 MIL/uL (ref 3.87–5.11)
RDW: 12.8 % (ref 11.5–15.5)
WBC: 6.2 10*3/uL (ref 4.0–10.5)
nRBC: 0 % (ref 0.0–0.2)

## 2020-11-26 LAB — BASIC METABOLIC PANEL
Anion gap: 12 (ref 5–15)
BUN: 14 mg/dL (ref 8–23)
CO2: 25 mmol/L (ref 22–32)
Calcium: 9.8 mg/dL (ref 8.9–10.3)
Chloride: 102 mmol/L (ref 98–111)
Creatinine, Ser: 0.95 mg/dL (ref 0.44–1.00)
GFR, Estimated: 58 mL/min — ABNORMAL LOW (ref >60)
Glucose, Bld: 177 mg/dL — ABNORMAL HIGH (ref 70–99)
Potassium: 4.5 mmol/L (ref 3.5–5.1)
Sodium: 139 mmol/L (ref 135–145)

## 2020-11-26 LAB — MAGNESIUM: Magnesium: 2 mg/dL (ref 1.7–2.4)

## 2020-11-26 NOTE — H&P (View-Only) (Signed)
Primary Care Physician: Lavone Orn, MD Cardiologist: [name] Pulmonologist: Dr. Elsworth Soho GI: Dr. Dolphus Jenny [name] is a 87 y.o. female with a h/o afib that has been on amiodarone since June of 2015. She has had many breakthrough events, last one in February with successful cardioversion. She does feel very weak in afib. She went into afib around 2 weeks ago and a RN came out to her house on Friday and documented the afib.  Dr. Rayann Heman was called and left instructions to increase amio to 200 mg bid and stop losartan. Apparently her BP was low on that day.  She is in now in the afib clinc and she is rate controlled and BP is stable. She reports around 2-3 weeks ago she first had 2 teeth pulled and then a shot of cortisone in her knee. She hasn't felt well since. She usually requires a cardioversion to restore SR.    F/u in afib clinic 7/15 after successful cardioversion. She is in SR today and feels improved. She is back to Amio 200 mg daily.  F/u in afib clinic, 11/25/19, with feelings of not being well and being out of rhythm x one week. She has had an UTI and just finished a round of doxycycline. EKG shows atrial flutter  variable rate at 110 bpm.CHA2DS2-VASc Score = 6, continues eliquis 5 mg bid. 1   F/u in  afib clinic, 12/16/18. She had a successful cardioversion but unfortunately felt like it only lasted one day. She is in afib in the 90's. Natasha will try to increase BB and keep amio  at 200 mg a little longer to see if she may return to [place].   F/u  in afib clinic, 01/15/20.  F/u cardioversion 2/17. She was kept on her higher dose of BB at time of cardioversion as she did covert out but with PAC's. She saw [name] one week out and was staying in [place]. She  was left on amiodarone 200 mg bid until f/u here. She called the office earlier in the week and c/o of feeling weak. EKG shows  Sinus brady at 48 bpm. Natasha feel her bradycardia is the reason for her weakness and will titrate meds down today.    F/u 01/22/20. She remains  in SR  in the low 60's but does not feel that much better.  Back on amiodarone 200 mg daily from  BID and lower dose of BB. Marland Kitchen She had some GI distress yesterday and recently had issues with UTI. She  denies urgency or frequency today.  F/u in afib clinic, 4/1, as pt has gone back into afib. Discussed with her Natasha feel that amiodarone is no longer effective and Natasha think it is best to stop drug at this point and rate control. If qt shortens off 3 month amiodarone wash out, currently at 512 ms then tikosyn may be an option down the line. She has multiple cardioversions with significant ERAF.  F/u in afib clinic, 4/14. She is feeling better off amiodarone and she is rate controlled in the 80's but would like to see her a little slower so will increase BB today. We can consider Tikosyn after  3 month amiodarone wash out if qt interval shortens.    She actually is feeling better and had the enegy to do more around her house and some light exercise. She is upset today as she had some diarrhea this am and had an accident in the clinic.Natasha feel this  is why her BP is higher today as she usually  does not have high BP. She feels it may the the plate of food her neighbor gave her last night. She is rate controlled at home in the 70's and 80's.   F/u in afib clinic, 6/29. She remains in rate controlled afib and states that she feels very good. She is exercising without any significant shortness of breath and is rate controlled. No excessive fluid retention. Natasha feel that amiodarone may have been contributing to fatigue and she feels better off drug. She remains on eliquis 5 mg bid for a CHA2DS2VASc score of 6.  F/u in afib clinic, 08/04/20. She  remains in rate controlled afib and initially tells me that she is exercising and feels great, but then tells me she  feels bad and would love to get back in rhythm. QT corrected around 430 ms. She has now been off amiodarone x 5 months.   F/u in afib  clinic, 10/19/20,  as pt wants to be admitted for Tikosyn initiation. She  states that she feels terrible in afib. She has been living afib for several months . Natasha cut back her BB yesterday as she is slow in SR when she returns to SR and she is  not quite as well rate controlled today. She has been on 50 mg metoprolol bid with nice rate control.   She failed amiodarone after taking for years  and it was stopped late Spring after multiple cardioversion that would only hold short term.  She  has been off drug for sufficient period of time to try Tikosyn Her qtc today is 480 ms, but in SR with amiodarone on board was 460 ms, EKg  in spring 2015 without amio was 439 ms.  No benadryl use.  No missed anticoagulation. Electrolytes ok for admission with crcl cal at 64 so she will qualify for 500 msc. Covid negative. + vaccines.  F/u in afib clinic, 10/29/20,  one week after hospital admit for Tikosyn. Her qt prolonged and she was discharged on 125 mcg bid. She  did require cardioversion. She states today she feels improved. Her qt is 500 ms. Natasha will need to recheck qt early next week.   F/u in afib clinic, 11/26/20, as she reported to the office last week that she went back into aifb. She states that she got stressed over her son falling and breaking his knee and a covid outbreak in the family but she was not exposed.  Ekg today shows atrial flutter with RVR with v rates in the 130's. Despite this pt feels well.  Will schedule cardioversion. She continues on Tikosyn 125 mcg bid. No missed eliquis with CHA2DS2VASc score of 6.   Today, she denies symptoms of chest pain, shortness of breath, orthopnea, PND, lower extremity edema, dizziness, presyncope, syncope, or neurologic sequela.Perisistent cough. The patient is tolerating medications without difficulties and is otherwise without complaint today.   Past Medical History:  Diagnosis Date  . Acute bronchitis 11/15/2017  . ADENOCARCINOMA, BREAST 10/13/2010   Qualifier:  Diagnosis of  By: Nils Pyle CMA (AAMA), Mearl Latin    . 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 Allergy    SEASONAL  . Anemia    years ago  . Anticoagulated by anticoagulation treatment - Eliquis 03/30/2014   Started May 2015   . Anxiety state 10/13/2010   Qualifier: Diagnosis of  By: Nils Pyle CMA ([place]), Mearl Latin    . Arthritis    "all my  joints" (03/31/2014)  . Barrett's esophagus   . Breast cancer [facility])    s/p right breast lumpectomy  . CARDIAC MURMUR 10/13/2010   Qualifier: Diagnosis of  By: Nils Pyle CMA ([place]), Mearl Latin    . 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 Cervical cancer ([place])   . Chronic cough 01/02/2017   GERD, hiatal hernia Doubt amio  . COLONIC POLYPS, ADENOMATOUS, HX OF 10/13/2010   Qualifier: Diagnosis of  By: Nils Pyle CMA (AAMA), Mearl Latin    . Degenerative disorder of eye 07/25/2012  . Diabetes mellitus without complication ([place])   . Dilated cardiomyopathy ([place]) 08/27/2018  . DIVERTICULOSIS, COLON 10/13/2010   Qualifier: Diagnosis of  By: Nils Pyle CMA (AAMA), Mearl Latin    . DJD (degenerative joint disease) of knee   . Dysrhythmia    Afib  . Echocardiogram 07/2020    Echo 9/21: EF 70-75, moderate LVH, GR 1 DD, RVSP 40.5 (mildly elevated), normal RVSF, moderate LAE, mild RAE, mild MR, trivial AI  . EROSIVE ESOPHAGITIS 10/13/2010   Qualifier: Diagnosis of  By: Nils Pyle CMA ([place]), Mearl Latin    . Error, refractive, myopia 07/09/2012  . Essential hypertension 10/13/2010   Qualifier: Diagnosis of  By: Nils Pyle CMA ([place]), Mearl Latin    . Exotropia, intermittent 07/09/2012  . GERD 10/14/2010   Qualifier: Diagnosis of  By: Sharlett Iles MD Byrd Hesselbach GERD (gastroesophageal reflux disease)   . Glaucoma   . H/O hiatal hernia   . Heart murmur   . Hiatal hernia s/p robotic repair & fundoplication 4/56/2563 89/01/7341   Qualifier: Diagnosis of  By: Nils Pyle CMA (AAMA), Mearl Latin    . History of laser assisted in situ keratomileusis 07/25/2012  . Hx of transesophageal echocardiography (TEE) for  monitoring    TEE (03/2014): No LAA clot, moderate LAE, core triatriatum type structure in RA with no stenosis, normal EF 55-60%, mild MR  . HYPERCHOLESTEROLEMIA 10/13/2010   Qualifier: Diagnosis of  By: Nils Pyle CMA ([place]), Mearl Latin    . Hypertension   . Hypothyroidism, postsurgical   . Iron Deficiency Anemia 10/14/2010   Qualifier: Diagnosis of  By: Sharlett Iles MD Byrd Hesselbach   . Irritable bowel syndrome 10/13/2010   Qualifier: Diagnosis of  By: Nils Pyle CMA (AAMA), Mearl Latin    . Lumbar stenosis with neurogenic claudication 04/20/2016  . Malignant neoplasm of breast ([place]) 02/25/2016  . Migraine    "last one was years ago" (03/31/2014)  . MIGRAINE HEADACHE 10/13/2010   Qualifier: Diagnosis of  By: Nils Pyle CMA ([place]), Mearl Latin    . 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 OSA on CPAP    settings at 10-11   . OSTEOARTHRITIS 10/13/2010   Qualifier: Diagnosis of  By: Nils Pyle CMA ([place]), Mearl Latin    . Persistent atrial fibrillation 03/31/2015   DLCO dropped from 100% in 2015-50% in 01/2017  . Post-surgical hypothyroidism 04/09/2014  . RECTAL FISSURE 10/13/2010   Qualifier: Diagnosis of  By: Nils Pyle CMA ([place]), Mearl Latin    . S/P Nissen fundoplication (without gastrostomy tube) procedure 05/25/2017  . Sleep apnea   . THYROID CANCER 10/13/2010   Qualifier: Diagnosis of  By: Nils Pyle CMA ([place]), Mearl Latin    . Thyroid cancer [facility])    s/p thyroidectomy  . Type II diabetes mellitus ([place])    type II    Past Surgical History:  Procedure Laterality Date  . ABDOMINAL HYSTERECTOMY     "partial"  . BREAST BIOPSY Right   . BREAST LUMPECTOMY Right   . CARDIOVERSION N/A 03/30/2014  Procedure: CARDIOVERSION - BEDSIDE;  Surgeon: [name], MD;  Location: [place];  Service: Cardiovascular;  Laterality: N/A;  . CARDIOVERSION N/A 03/31/2014   Procedure: CARDIOVERSION  (BEDSIDE) ;  Surgeon: Lelon Perla, MD;  Location: [place];  Service: Cardiovascular;  Laterality: N/A;  . CARDIOVERSION N/A 04/27/2014   Procedure:  CARDIOVERSION;  Surgeon: Darlin Coco, MD;  Location: [place];  Service: Cardiovascular;  Laterality: N/A;  . CARDIOVERSION N/A 04/13/2015   Procedure: CARDIOVERSION;  Surgeon: Thayer Headings, MD;  Location: [place];  Service: Cardiovascular;  Laterality: N/A;  . CARDIOVERSION N/A 01/08/2017   Procedure: CARDIOVERSION;  Surgeon: Sanda Klein, MD;  Location: [facility] ENDOSCOPY;  Service: Cardiovascular;  Laterality: N/A;  . CARDIOVERSION N/A 09/19/2017   Procedure: CARDIOVERSION;  Surgeon: [name], MD;  Location: [place];  Service: Cardiovascular;  Laterality: N/A;  . CARDIOVERSION N/A 10/25/2017   Procedure: CARDIOVERSION;  Surgeon: Acie Fredrickson Wonda Cheng, MD;  Location: [place];  Service: Cardiovascular;  Laterality: N/A;  . CARDIOVERSION N/A 12/27/2018   Procedure: CARDIOVERSION;  Surgeon: [name], MD;  Location: [place];  Service: Cardiovascular;  Laterality: N/A;  . CARDIOVERSION N/A 05/20/2019   Procedure: CARDIOVERSION;  Surgeon: [name], MD;  Location: [facility] ENDOSCOPY;  Service: Cardiovascular;  Laterality: N/A;  . CARDIOVERSION N/A 12/10/2019   Procedure: CARDIOVERSION;  Surgeon: Pixie Casino, MD;  Location: [facility] ENDOSCOPY;  Service: Cardiovascular;  Laterality: N/A;  . CARDIOVERSION N/A 12/31/2019   Procedure: CARDIOVERSION;  Surgeon: Sanda Klein, MD;  Location: [facility] ENDOSCOPY;  Service: Cardiovascular;  Laterality: N/A;  . CARDIOVERSION N/A 10/21/2020   Procedure: CARDIOVERSION;  Surgeon: [name], MD;  Location: [facility] ENDOSCOPY;  Service: Cardiovascular;  Laterality: N/A;  . CARPAL TUNNEL RELEASE Right   . CATARACT EXTRACTION W/ INTRAOCULAR LENS  IMPLANT, BILATERAL Bilateral   . COLONOSCOPY    . EXCISIONAL HEMORRHOIDECTOMY    . INSERTION OF MESH N/A 05/25/2017   Procedure: INSERTION OF MESH;  Surgeon: Ralene Ok, MD;  Location: [facility];  Service: General;  Laterality: N/A;  . TEE WITHOUT CARDIOVERSION N/A 03/30/2014   Procedure:  TRANSESOPHAGEAL ECHOCARDIOGRAM (TEE);  Surgeon: [name], MD;  Location: [facility] ENDOSCOPY;  Service: Cardiovascular;  Laterality: N/A;  . THYROIDECTOMY    . TONSILLECTOMY      Current Outpatient Medications  Medication Sig Dispense Refill  . ACCU-CHEK GUIDE test strip     . acetaminophen (TYLENOL) 650 MG CR tablet Take 650 mg by mouth in the morning, at noon, and at bedtime.    . Blood Glucose Monitoring Suppl (ACCU-CHEK GUIDE ME) w/Device KIT     . Calcium Citrate (CITRACAL PO) Take 800 mg by mouth daily.     . cetirizine (ZYRTEC) 10 MG tablet Take 10 mg by mouth daily.    . Cholecalciferol (DIALYVITE VITAMIN D 5000) 125 MCG (5000 UT) capsule Take 5,000 Units by mouth daily.    Marland Kitchen dofetilide (TIKOSYN) 125 MCG capsule Take 1 capsule (125 mcg total) by mouth 2 (two) times daily. 60 capsule 11  . ELIQUIS 5 MG TABS tablet TAKE 1 TABLET TWICE DAILY (Patient taking differently: Take 5 mg by mouth 2 (two) times daily.) 180 tablet 1  . ferrous sulfate 325 (65 FE) MG tablet Take 325 mg by mouth daily with breakfast.    . fluticasone (FLONASE) 50 MCG/ACT nasal spray Place 1 spray into both nostrils at bedtime as needed for allergies.     Marland Kitchen lansoprazole (PREVACID) 30 MG capsule Take  30 mg by mouth daily as needed (acid reflux).     Marland Kitchen levothyroxine (SYNTHROID) 112 MCG tablet Take 112 mcg by mouth daily before breakfast.     . LORazepam (ATIVAN) 1 MG tablet Take 1.5 mg by mouth at bedtime.     . Lutein 6 MG CAPS Take 6 mg by mouth daily.     . magnesium oxide (MAG-OX) 400 (241.3 Mg) MG tablet Take 1 tablet (400 mg total) by mouth 2 (two) times daily. 60 tablet 3  . metFORMIN (GLUCOPHAGE-XR) 500 MG 24 hr tablet Take 1 tablet by mouth 2 (two) times daily.    . metoprolol tartrate (LOPRESSOR) 25 MG tablet Take 1 tablet (25 mg total) by mouth 2 (two) times daily. 60 tablet 5  . Multiple Vitamins-Minerals (PRESERVISION AREDS 2) CAPS Take 1 capsule by mouth daily.    . Multiple Vitamins-Minerals (ZINC PO)  Take 0.5 tablets by mouth 2 (two) times a week.     . Polyvinyl Alcohol-Povidone PF 1.4-0.6 % SOLN Place 1 drop into both eyes daily as needed (dry eyes).     . potassium chloride SA (KLOR-CON) 20 MEQ tablet Take 2 tablets (40 mEq total) by mouth daily. 90 tablet 1  . rosuvastatin (CRESTOR) 10 MG tablet Take 10 mg by mouth at bedtime.    . sertraline (ZOLOFT) 50 MG tablet Take 50 mg by mouth daily.    . vitamin B-12 (CYANOCOBALAMIN) 1000 MCG tablet Take 1,000 mcg by mouth daily.     No current facility-administered medications for this encounter.    Allergies  Allergen Reactions  . Penicillins Itching, Rash and Other (See Comments)    Did it involve swelling of the face/tongue/throat, SOB, or low BP? No Did it involve sudden or severe rash/hives, skin peeling, or any reaction on the inside of your mouth or nose? No Did you need to seek medical attention at a hospital or doctor's office? No When did it last happen?30+ years If all above answers are "NO", may proceed with cephalosporin use.     Social History   Socioeconomic History  . Marital status: Divorced    Spouse name: Not on file  . Number of children: 2  . Years of education: Not on file  . Highest education level: Not on file  Occupational History  . Occupation: Retired    Comment: Education officer, museum  Tobacco Use  . Smoking status: Never Smoker  . Smokeless tobacco: Never Used  Vaping Use  . Vaping Use: Never used  Substance and Sexual Activity  . Alcohol use: No    Alcohol/week: 0.0 standard drinks  . Drug use: No  . Sexual activity: Never  Other Topics Concern  . Not on file  Social History Narrative  . Not on file   Social Determinants of Health   Financial Resource Strain: Not on file  Food Insecurity: Not on file  Transportation Needs: Not on file  Physical Activity: Not on file  Stress: Not on file  Social Connections: Not on file  Intimate Partner Violence: Not on file    Family History   Problem Relation Age of Onset  . Prostate cancer Father   . Diabetes Sister   . Heart disease Sister   . Heart attack Brother   . Heart disease Brother   . Diabetes Brother   . Diabetes Sister   . Diabetes Sister   . Pancreatic cancer Sister   . Diabetes Brother   . Heart disease Brother   .  Diabetes Brother   . Heart disease Brother   . Diabetes Brother   . Heart disease Brother   . Diabetes Brother   . Heart disease Brother   . Diabetes Brother   . Heart disease Brother   . Diabetes Brother   . Diabetes Child     ROS- All systems are reviewed and negative except as per the HPI above  Physical Exam: There were no vitals filed for this visit. Wt Readings from Last 3 Encounters:  10/29/20 79.3 kg  10/23/20 78.9 kg  10/19/20 80.6 kg    Labs: Lab Results  Component Value Date   NA 140 10/29/2020   K 4.8 10/29/2020   CL 103 10/29/2020   CO2 27 10/29/2020   GLUCOSE 174 (H) 10/29/2020   BUN 14 10/29/2020   CREATININE 0.72 10/29/2020   CALCIUM 9.6 10/29/2020   MG 2.2 10/29/2020   Lab Results  Component Value Date   INR 1.2 10/21/2020   No results found for: CHOL, HDL, LDLCALC, TRIG   GEN- The patient is well appearing, alert and oriented x 3 today.   Head- normocephalic, atraumatic Eyes-  Sclera clear, conjunctiva pink Ears- hearing intact Oropharynx- clear Neck- supple, no JVP Lymph- no cervical lymphadenopathy Lungs- Clear to ausculation bilaterally, normal work of breathing Heart irregular rate and rhythm, no murmurs, rubs or gallops, PMI not laterally displaced GI- soft, NT, ND, + BS Extremities- no clubbing, cyanosis, or edema MS- no significant deformity or atrophy Skin- no rash or lesion Psych- euthymic mood, full affect Neuro- strength and sensation are intact  EKG- atrial flutter with variable block at 132 bpm, qrs int 88 ms, qtc 521 ms  Epic records reviewed   Assessment and Plan: 1. Persistent afib Multiple cardioversion's in the past  1-2 years Last cardioversion, 12/31/19, was successful but  with significant  weakness 2/2 bradycardia Sinus brady resolved with reduction of BB and amiodarone reduced  back to 200 mg qd  However returned to  afib  Discussed on previous visit in April,  Natasha felt that amiodarone was  no longer effective to maintain SR and Natasha could  not keep her at 200 mg bid as she may get toxic on it. Natasha thought  it was best to stop drug and rate control  She has had good rate control in afib but really desired to get back in SR, has a lot of fatigue," feels terrible"  We discussed Tikosyn and she really wanted to pursue, qtc appears to be  slightly prolonged in afib today, but appeared better in SR with amio on board at 460 ms  She  was able to get on Tikosyn at 125 mcg bid but has now had return to afib with RVR She did miss one dose of tikosyn last week as well, afib started shortly after . Will schedule for cardioversion  Increase metoprolol to 37.5 mg bid until am of cardioversion then return to 25 mg bid  Cbc/bmet/mag/covid   2. HTN Stable    3. CHA2DS2VASc score of 6 Eliquis 5 mg bid  States no missed doses x 3 weeks    F/u with Dr. Rayann Heman after cardioversion   Geroge Baseman. Marykathleen Russi, [facility] [address] [place], [place] [address] [phone]

## 2020-11-26 NOTE — Patient Instructions (Signed)
Increase metoprolol to 1 and 1/2 tablets twice a day until day of cardioversion then go back to 1 tablet twice a day  Cardioversion scheduled for Friday, January 21st  - Arrive at the [facility] and go to admitting at 730AM  - Do not eat or drink anything after midnight the night prior to your procedure.  - Take all your morning medication (except diabetic medications) with a sip of water prior to arrival.  - You will not be able to drive home after your procedure.  - Do NOT miss any doses of your blood thinner - if you should miss a dose please notify our office immediately.  - If you feel as if you go back into normal rhythm prior to scheduled cardioversion, please notify our office immediately. If your procedure is canceled in the cardioversion suite you will be charged a cancellation fee.

## 2020-11-26 NOTE — Progress Notes (Signed)
Primary Care Physician: Lavone Orn, MD Cardiologist: [name] Pulmonologist: Dr. Elsworth Soho GI: Dr. Dolphus Jenny [name] is a 87 y.o. female with a h/o afib that has been on amiodarone since June of 2015. She has had many breakthrough events, last one in February with successful cardioversion. She does feel very weak in afib. She went into afib around 2 weeks ago and a RN came out to her house on Friday and documented the afib.  Dr. Rayann Heman was called and left instructions to increase amio to 200 mg bid and stop losartan. Apparently her BP was low on that day.  She is in now in the afib clinc and she is rate controlled and BP is stable. She reports around 2-3 weeks ago she first had 2 teeth pulled and then a shot of cortisone in her knee. She hasn't felt well since. She usually requires a cardioversion to restore SR.    F/u in afib clinic 7/15 after successful cardioversion. She is in SR today and feels improved. She is back to Amio 200 mg daily.  F/u in afib clinic, 11/25/19, with feelings of not being well and being out of rhythm x one week. She has had an UTI and just finished a round of doxycycline. EKG shows atrial flutter  variable rate at 110 bpm.CHA2DS2-VASc Score = 6, continues eliquis 5 mg bid. 1   F/u in  afib clinic, 12/16/18. She had a successful cardioversion but unfortunately felt like it only lasted one day. She is in afib in the 90's. Natasha will try to increase BB and keep amio  at 200 mg a little longer to see if she may return to [place].   F/u  in afib clinic, 01/15/20.  F/u cardioversion 2/17. She was kept on her higher dose of BB at time of cardioversion as she did covert out but with PAC's. She saw [name] one week out and was staying in [place]. She  was left on amiodarone 200 mg bid until f/u here. She called the office earlier in the week and c/o of feeling weak. EKG shows  Sinus brady at 48 bpm. Natasha feel her bradycardia is the reason for her weakness and will titrate meds down today.    F/u 01/22/20. She remains  in SR  in the low 60's but does not feel that much better.  Back on amiodarone 200 mg daily from  BID and lower dose of BB. Marland Kitchen She had some GI distress yesterday and recently had issues with UTI. She  denies urgency or frequency today.  F/u in afib clinic, 4/1, as pt has gone back into afib. Discussed with her Natasha feel that amiodarone is no longer effective and Natasha think it is best to stop drug at this point and rate control. If qt shortens off 3 month amiodarone wash out, currently at 512 ms then tikosyn may be an option down the line. She has multiple cardioversions with significant ERAF.  F/u in afib clinic, 4/14. She is feeling better off amiodarone and she is rate controlled in the 80's but would like to see her a little slower so will increase BB today. We can consider Tikosyn after  3 month amiodarone wash out if qt interval shortens.    She actually is feeling better and had the enegy to do more around her house and some light exercise. She is upset today as she had some diarrhea this am and had an accident in the clinic.Natasha feel this  is why her BP is higher today as she usually  does not have high BP. She feels it may the the plate of food her neighbor gave her last night. She is rate controlled at home in the 70's and 80's.   F/u in afib clinic, 6/29. She remains in rate controlled afib and states that she feels very good. She is exercising without any significant shortness of breath and is rate controlled. No excessive fluid retention. Natasha feel that amiodarone may have been contributing to fatigue and she feels better off drug. She remains on eliquis 5 mg bid for a CHA2DS2VASc score of 6.  F/u in afib clinic, 08/04/20. She  remains in rate controlled afib and initially tells me that she is exercising and feels great, but then tells me she  feels bad and would love to get back in rhythm. QT corrected around 430 ms. She has now been off amiodarone x 5 months.   F/u in afib  clinic, 10/19/20,  as pt wants to be admitted for Tikosyn initiation. She  states that she feels terrible in afib. She has been living afib for several months . Natasha cut back her BB yesterday as she is slow in SR when she returns to SR and she is  not quite as well rate controlled today. She has been on 50 mg metoprolol bid with nice rate control.   She failed amiodarone after taking for years  and it was stopped late Spring after multiple cardioversion that would only hold short term.  She  has been off drug for sufficient period of time to try Tikosyn Her qtc today is 480 ms, but in SR with amiodarone on board was 460 ms, EKg  in spring 2015 without amio was 439 ms.  No benadryl use.  No missed anticoagulation. Electrolytes ok for admission with crcl cal at 64 so she will qualify for 500 msc. Covid negative. + vaccines.  F/u in afib clinic, 10/29/20,  one week after hospital admit for Tikosyn. Her qt prolonged and she was discharged on 125 mcg bid. She  did require cardioversion. She states today she feels improved. Her qt is 500 ms. Natasha will need to recheck qt early next week.   F/u in afib clinic, 11/26/20, as she reported to the office last week that she went back into aifb. She states that she got stressed over her son falling and breaking his knee and a covid outbreak in the family but she was not exposed.  Ekg today shows atrial flutter with RVR with v rates in the 130's. Despite this pt feels well.  Will schedule cardioversion. She continues on Tikosyn 125 mcg bid. No missed eliquis with CHA2DS2VASc score of 6.   Today, she denies symptoms of chest pain, shortness of breath, orthopnea, PND, lower extremity edema, dizziness, presyncope, syncope, or neurologic sequela.Perisistent cough. The patient is tolerating medications without difficulties and is otherwise without complaint today.   Past Medical History:  Diagnosis Date  . Acute bronchitis 11/15/2017  . ADENOCARCINOMA, BREAST 10/13/2010   Qualifier:  Diagnosis of  By: Nils Pyle CMA (AAMA), Mearl Latin    . 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 Allergy    SEASONAL  . Anemia    years ago  . Anticoagulated by anticoagulation treatment - Eliquis 03/30/2014   Started May 2015   . Anxiety state 10/13/2010   Qualifier: Diagnosis of  By: Nils Pyle CMA ([place]), Mearl Latin    . Arthritis    "all my  joints" (03/31/2014)  . Barrett's esophagus   . Breast cancer [facility])    s/p right breast lumpectomy  . CARDIAC MURMUR 10/13/2010   Qualifier: Diagnosis of  By: Nils Pyle CMA ([place]), Mearl Latin    . 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 Cervical cancer ([place])   . Chronic cough 01/02/2017   GERD, hiatal hernia Doubt amio  . COLONIC POLYPS, ADENOMATOUS, HX OF 10/13/2010   Qualifier: Diagnosis of  By: Nils Pyle CMA (AAMA), Mearl Latin    . Degenerative disorder of eye 07/25/2012  . Diabetes mellitus without complication ([place])   . Dilated cardiomyopathy ([place]) 08/27/2018  . DIVERTICULOSIS, COLON 10/13/2010   Qualifier: Diagnosis of  By: Nils Pyle CMA (AAMA), Mearl Latin    . DJD (degenerative joint disease) of knee   . Dysrhythmia    Afib  . Echocardiogram 07/2020    Echo 9/21: EF 70-75, moderate LVH, GR 1 DD, RVSP 40.5 (mildly elevated), normal RVSF, moderate LAE, mild RAE, mild MR, trivial AI  . EROSIVE ESOPHAGITIS 10/13/2010   Qualifier: Diagnosis of  By: Nils Pyle CMA ([place]), Mearl Latin    . Error, refractive, myopia 07/09/2012  . Essential hypertension 10/13/2010   Qualifier: Diagnosis of  By: Nils Pyle CMA ([place]), Mearl Latin    . Exotropia, intermittent 07/09/2012  . GERD 10/14/2010   Qualifier: Diagnosis of  By: Sharlett Iles MD Byrd Hesselbach GERD (gastroesophageal reflux disease)   . Glaucoma   . H/O hiatal hernia   . Heart murmur   . Hiatal hernia s/p robotic repair & fundoplication 4/56/2563 89/01/7341   Qualifier: Diagnosis of  By: Nils Pyle CMA (AAMA), Mearl Latin    . History of laser assisted in situ keratomileusis 07/25/2012  . Hx of transesophageal echocardiography (TEE) for  monitoring    TEE (03/2014): No LAA clot, moderate LAE, core triatriatum type structure in RA with no stenosis, normal EF 55-60%, mild MR  . HYPERCHOLESTEROLEMIA 10/13/2010   Qualifier: Diagnosis of  By: Nils Pyle CMA ([place]), Mearl Latin    . Hypertension   . Hypothyroidism, postsurgical   . Iron Deficiency Anemia 10/14/2010   Qualifier: Diagnosis of  By: Sharlett Iles MD Byrd Hesselbach   . Irritable bowel syndrome 10/13/2010   Qualifier: Diagnosis of  By: Nils Pyle CMA (AAMA), Mearl Latin    . Lumbar stenosis with neurogenic claudication 04/20/2016  . Malignant neoplasm of breast ([place]) 02/25/2016  . Migraine    "last one was years ago" (03/31/2014)  . MIGRAINE HEADACHE 10/13/2010   Qualifier: Diagnosis of  By: Nils Pyle CMA ([place]), Mearl Latin    . 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 OSA on CPAP    settings at 10-11   . OSTEOARTHRITIS 10/13/2010   Qualifier: Diagnosis of  By: Nils Pyle CMA ([place]), Mearl Latin    . Persistent atrial fibrillation 03/31/2015   DLCO dropped from 100% in 2015-50% in 01/2017  . Post-surgical hypothyroidism 04/09/2014  . RECTAL FISSURE 10/13/2010   Qualifier: Diagnosis of  By: Nils Pyle CMA ([place]), Mearl Latin    . S/P Nissen fundoplication (without gastrostomy tube) procedure 05/25/2017  . Sleep apnea   . THYROID CANCER 10/13/2010   Qualifier: Diagnosis of  By: Nils Pyle CMA ([place]), Mearl Latin    . Thyroid cancer [facility])    s/p thyroidectomy  . Type II diabetes mellitus ([place])    type II    Past Surgical History:  Procedure Laterality Date  . ABDOMINAL HYSTERECTOMY     "partial"  . BREAST BIOPSY Right   . BREAST LUMPECTOMY Right   . CARDIOVERSION N/A 03/30/2014  Procedure: CARDIOVERSION - BEDSIDE;  Surgeon: [name], MD;  Location: [place];  Service: Cardiovascular;  Laterality: N/A;  . CARDIOVERSION N/A 03/31/2014   Procedure: CARDIOVERSION  (BEDSIDE) ;  Surgeon: Lelon Perla, MD;  Location: [place];  Service: Cardiovascular;  Laterality: N/A;  . CARDIOVERSION N/A 04/27/2014   Procedure:  CARDIOVERSION;  Surgeon: Darlin Coco, MD;  Location: [place];  Service: Cardiovascular;  Laterality: N/A;  . CARDIOVERSION N/A 04/13/2015   Procedure: CARDIOVERSION;  Surgeon: Thayer Headings, MD;  Location: [place];  Service: Cardiovascular;  Laterality: N/A;  . CARDIOVERSION N/A 01/08/2017   Procedure: CARDIOVERSION;  Surgeon: Sanda Klein, MD;  Location: [facility] ENDOSCOPY;  Service: Cardiovascular;  Laterality: N/A;  . CARDIOVERSION N/A 09/19/2017   Procedure: CARDIOVERSION;  Surgeon: [name], MD;  Location: [place];  Service: Cardiovascular;  Laterality: N/A;  . CARDIOVERSION N/A 10/25/2017   Procedure: CARDIOVERSION;  Surgeon: Acie Fredrickson Wonda Cheng, MD;  Location: [place];  Service: Cardiovascular;  Laterality: N/A;  . CARDIOVERSION N/A 12/27/2018   Procedure: CARDIOVERSION;  Surgeon: [name], MD;  Location: [place];  Service: Cardiovascular;  Laterality: N/A;  . CARDIOVERSION N/A 05/20/2019   Procedure: CARDIOVERSION;  Surgeon: [name], MD;  Location: [facility] ENDOSCOPY;  Service: Cardiovascular;  Laterality: N/A;  . CARDIOVERSION N/A 12/10/2019   Procedure: CARDIOVERSION;  Surgeon: Pixie Casino, MD;  Location: [facility] ENDOSCOPY;  Service: Cardiovascular;  Laterality: N/A;  . CARDIOVERSION N/A 12/31/2019   Procedure: CARDIOVERSION;  Surgeon: Sanda Klein, MD;  Location: [facility] ENDOSCOPY;  Service: Cardiovascular;  Laterality: N/A;  . CARDIOVERSION N/A 10/21/2020   Procedure: CARDIOVERSION;  Surgeon: [name], MD;  Location: [facility] ENDOSCOPY;  Service: Cardiovascular;  Laterality: N/A;  . CARPAL TUNNEL RELEASE Right   . CATARACT EXTRACTION W/ INTRAOCULAR LENS  IMPLANT, BILATERAL Bilateral   . COLONOSCOPY    . EXCISIONAL HEMORRHOIDECTOMY    . INSERTION OF MESH N/A 05/25/2017   Procedure: INSERTION OF MESH;  Surgeon: Ralene Ok, MD;  Location: [facility];  Service: General;  Laterality: N/A;  . TEE WITHOUT CARDIOVERSION N/A 03/30/2014   Procedure:  TRANSESOPHAGEAL ECHOCARDIOGRAM (TEE);  Surgeon: [name], MD;  Location: [facility] ENDOSCOPY;  Service: Cardiovascular;  Laterality: N/A;  . THYROIDECTOMY    . TONSILLECTOMY      Current Outpatient Medications  Medication Sig Dispense Refill  . ACCU-CHEK GUIDE test strip     . acetaminophen (TYLENOL) 650 MG CR tablet Take 650 mg by mouth in the morning, at noon, and at bedtime.    . Blood Glucose Monitoring Suppl (ACCU-CHEK GUIDE ME) w/Device KIT     . Calcium Citrate (CITRACAL PO) Take 800 mg by mouth daily.     . cetirizine (ZYRTEC) 10 MG tablet Take 10 mg by mouth daily.    . Cholecalciferol (DIALYVITE VITAMIN D 5000) 125 MCG (5000 UT) capsule Take 5,000 Units by mouth daily.    Marland Kitchen dofetilide (TIKOSYN) 125 MCG capsule Take 1 capsule (125 mcg total) by mouth 2 (two) times daily. 60 capsule 11  . ELIQUIS 5 MG TABS tablet TAKE 1 TABLET TWICE DAILY (Patient taking differently: Take 5 mg by mouth 2 (two) times daily.) 180 tablet 1  . ferrous sulfate 325 (65 FE) MG tablet Take 325 mg by mouth daily with breakfast.    . fluticasone (FLONASE) 50 MCG/ACT nasal spray Place 1 spray into both nostrils at bedtime as needed for allergies.     Marland Kitchen lansoprazole (PREVACID) 30 MG capsule Take  30 mg by mouth daily as needed (acid reflux).     Marland Kitchen levothyroxine (SYNTHROID) 112 MCG tablet Take 112 mcg by mouth daily before breakfast.     . LORazepam (ATIVAN) 1 MG tablet Take 1.5 mg by mouth at bedtime.     . Lutein 6 MG CAPS Take 6 mg by mouth daily.     . magnesium oxide (MAG-OX) 400 (241.3 Mg) MG tablet Take 1 tablet (400 mg total) by mouth 2 (two) times daily. 60 tablet 3  . metFORMIN (GLUCOPHAGE-XR) 500 MG 24 hr tablet Take 1 tablet by mouth 2 (two) times daily.    . metoprolol tartrate (LOPRESSOR) 25 MG tablet Take 1 tablet (25 mg total) by mouth 2 (two) times daily. 60 tablet 5  . Multiple Vitamins-Minerals (PRESERVISION AREDS 2) CAPS Take 1 capsule by mouth daily.    . Multiple Vitamins-Minerals (ZINC PO)  Take 0.5 tablets by mouth 2 (two) times a week.     . Polyvinyl Alcohol-Povidone PF 1.4-0.6 % SOLN Place 1 drop into both eyes daily as needed (dry eyes).     . potassium chloride SA (KLOR-CON) 20 MEQ tablet Take 2 tablets (40 mEq total) by mouth daily. 90 tablet 1  . rosuvastatin (CRESTOR) 10 MG tablet Take 10 mg by mouth at bedtime.    . sertraline (ZOLOFT) 50 MG tablet Take 50 mg by mouth daily.    . vitamin B-12 (CYANOCOBALAMIN) 1000 MCG tablet Take 1,000 mcg by mouth daily.     No current facility-administered medications for this encounter.    Allergies  Allergen Reactions  . Penicillins Itching, Rash and Other (See Comments)    Did it involve swelling of the face/tongue/throat, SOB, or low BP? No Did it involve sudden or severe rash/hives, skin peeling, or any reaction on the inside of your mouth or nose? No Did you need to seek medical attention at a hospital or doctor's office? No When did it last happen?30+ years If all above answers are "NO", may proceed with cephalosporin use.     Social History   Socioeconomic History  . Marital status: Divorced    Spouse name: Not on file  . Number of children: 2  . Years of education: Not on file  . Highest education level: Not on file  Occupational History  . Occupation: Retired    Comment: Education officer, museum  Tobacco Use  . Smoking status: Never Smoker  . Smokeless tobacco: Never Used  Vaping Use  . Vaping Use: Never used  Substance and Sexual Activity  . Alcohol use: No    Alcohol/week: 0.0 standard drinks  . Drug use: No  . Sexual activity: Never  Other Topics Concern  . Not on file  Social History Narrative  . Not on file   Social Determinants of Health   Financial Resource Strain: Not on file  Food Insecurity: Not on file  Transportation Needs: Not on file  Physical Activity: Not on file  Stress: Not on file  Social Connections: Not on file  Intimate Partner Violence: Not on file    Family History   Problem Relation Age of Onset  . Prostate cancer Father   . Diabetes Sister   . Heart disease Sister   . Heart attack Brother   . Heart disease Brother   . Diabetes Brother   . Diabetes Sister   . Diabetes Sister   . Pancreatic cancer Sister   . Diabetes Brother   . Heart disease Brother   .  Diabetes Brother   . Heart disease Brother   . Diabetes Brother   . Heart disease Brother   . Diabetes Brother   . Heart disease Brother   . Diabetes Brother   . Heart disease Brother   . Diabetes Brother   . Diabetes Child     ROS- All systems are reviewed and negative except as per the HPI above  Physical Exam: There were no vitals filed for this visit. Wt Readings from Last 3 Encounters:  10/29/20 79.3 kg  10/23/20 78.9 kg  10/19/20 80.6 kg    Labs: Lab Results  Component Value Date   NA 140 10/29/2020   K 4.8 10/29/2020   CL 103 10/29/2020   CO2 27 10/29/2020   GLUCOSE 174 (H) 10/29/2020   BUN 14 10/29/2020   CREATININE 0.72 10/29/2020   CALCIUM 9.6 10/29/2020   MG 2.2 10/29/2020   Lab Results  Component Value Date   INR 1.2 10/21/2020   No results found for: CHOL, HDL, LDLCALC, TRIG   GEN- The patient is well appearing, alert and oriented x 3 today.   Head- normocephalic, atraumatic Eyes-  Sclera clear, conjunctiva pink Ears- hearing intact Oropharynx- clear Neck- supple, no JVP Lymph- no cervical lymphadenopathy Lungs- Clear to ausculation bilaterally, normal work of breathing Heart irregular rate and rhythm, no murmurs, rubs or gallops, PMI not laterally displaced GI- soft, NT, ND, + BS Extremities- no clubbing, cyanosis, or edema MS- no significant deformity or atrophy Skin- no rash or lesion Psych- euthymic mood, full affect Neuro- strength and sensation are intact  EKG- atrial flutter with variable block at 132 bpm, qrs int 88 ms, qtc 521 ms  Epic records reviewed   Assessment and Plan: 1. Persistent afib Multiple cardioversion's in the past  1-2 years Last cardioversion, 12/31/19, was successful but  with significant  weakness 2/2 bradycardia Sinus brady resolved with reduction of BB and amiodarone reduced  back to 200 mg qd  However returned to  afib  Discussed on previous visit in April,  Natasha felt that amiodarone was  no longer effective to maintain SR and Natasha could  not keep her at 200 mg bid as she may get toxic on it. Natasha thought  it was best to stop drug and rate control  She has had good rate control in afib but really desired to get back in SR, has a lot of fatigue," feels terrible"  We discussed Tikosyn and she really wanted to pursue, qtc appears to be  slightly prolonged in afib today, but appeared better in SR with amio on board at 460 ms  She  was able to get on Tikosyn at 125 mcg bid but has now had return to afib with RVR She did miss one dose of tikosyn last week as well, afib started shortly after . Will schedule for cardioversion  Increase metoprolol to 37.5 mg bid until am of cardioversion then return to 25 mg bid  Cbc/bmet/mag/covid   2. HTN Stable    3. CHA2DS2VASc score of 6 Eliquis 5 mg bid  States no missed doses x 3 weeks    F/u with Dr. Rayann Heman after cardioversion   Geroge Baseman. Rayhana Slider, [facility] [address] [place], [place] [address] [phone]

## 2020-12-01 ENCOUNTER — Other Ambulatory Visit (HOSPITAL_COMMUNITY)
Admission: RE | Admit: 2020-12-01 | Discharge: 2020-12-01 | Disposition: A | Discharge status: Home / Self Care | Payer: Medicare PPO | Source: Ambulatory Visit | Attending: Cardiology | Admitting: Cardiology

## 2020-12-01 DIAGNOSIS — Z01812 Encounter for preprocedural laboratory examination: Secondary | ICD-10-CM | POA: Insufficient documentation

## 2020-12-01 DIAGNOSIS — Z20822 Contact with and (suspected) exposure to covid-19: Secondary | ICD-10-CM | POA: Diagnosis not present

## 2020-12-01 LAB — SARS CORONAVIRUS 2 (TAT 6-24 HRS): SARS Coronavirus 2: NEGATIVE

## 2020-12-02 NOTE — Anesthesia Preprocedure Evaluation (Addendum)
Anesthesia Evaluation  Patient identified by MRN, date of birth, ID band Patient awake    Reviewed: Allergy & Precautions, H&P , NPO status , Patient's Chart, lab work & pertinent test results  History of Anesthesia Complications Negative for: history of anesthetic complications  Airway Mallampati: II  TM Distance: >3 FB Neck ROM: Full    Dental   Pulmonary sleep apnea ,    breath sounds clear to auscultation       Cardiovascular hypertension, Pt. on medications and Pt. on home beta blockers + dysrhythmias Atrial Fibrillation  Rhythm:Irregular Rate:Tachycardia    1. Left ventricular ejection fraction, by estimation, is 70 to 75%. The  left ventricle has hyperdynamic function. The left ventricle has no  regional wall motion abnormalities. There is moderate left ventricular  hypertrophy. Left ventricular diastolic  parameters are consistent with Grade I diastolic dysfunction (impaired  relaxation).  2. Right ventricular systolic function is normal. The right ventricular  size is normal. There is mildly elevated pulmonary artery systolic  pressure.  3. Left atrial size was moderately dilated.  4. Right atrial size was mildly dilated.  5. The mitral valve is grossly normal. Mild mitral valve regurgitation.  No evidence of mitral stenosis. Severe mitral annular calcification.  6. The aortic valve is tricuspid. There is moderate calcification of the  aortic valve. There is moderate thickening of the aortic valve. Aortic  valve regurgitation is trivial. No aortic stenosis is present. Aortic  regurgitation PHT measures 474 msec.  7. The inferior vena cava is normal in size with greater than 50%  respiratory variability, suggesting right atrial pressure of 3 mmHg.    Neuro/Psych  Headaches, Anxiety    GI/Hepatic Neg liver ROS, hiatal hernia, GERD  Medicated and Controlled,  Endo/Other  diabetes, Type 2, Oral Hypoglycemic  AgentsHypothyroidism   Renal/GU negative Renal ROS  negative genitourinary   Musculoskeletal  (+) Arthritis , Osteoarthritis,    Abdominal   Peds  Hematology  (+) Blood dyscrasia, anemia ,   Anesthesia Other Findings   Reproductive/Obstetrics negative OB ROS                            Anesthesia Physical  Anesthesia Plan  ASA: III  Anesthesia Plan: General   Post-op Pain Management:    Induction: Intravenous  PONV Risk Score and Plan: 3 and Propofol infusion and Treatment may vary due to age or medical condition  Airway Management Planned: Mask  Additional Equipment:   Intra-op Plan:   Post-operative Plan:   Informed Consent: I have reviewed the patients History and Physical, chart, labs and discussed the procedure including the risks, benefits and alternatives for the proposed anesthesia with the patient or authorized representative who has indicated his/her understanding and acceptance.     Dental advisory given  Plan Discussed with: Anesthesiologist  Anesthesia Plan Comments: (80/50)      Anesthesia Quick Evaluation

## 2020-12-03 ENCOUNTER — Encounter (HOSPITAL_COMMUNITY): Payer: Self-pay | Admitting: Cardiology

## 2020-12-03 ENCOUNTER — Ambulatory Visit (HOSPITAL_COMMUNITY)
Admission: RE | Admit: 2020-12-03 | Discharge: 2020-12-03 | Disposition: A | Discharge status: Home / Self Care | Payer: Medicare PPO | Attending: Cardiology | Admitting: Cardiology

## 2020-12-03 ENCOUNTER — Ambulatory Visit (HOSPITAL_COMMUNITY): Payer: Medicare PPO | Admitting: Anesthesiology

## 2020-12-03 ENCOUNTER — Ambulatory Visit: Payer: Medicare PPO | Admitting: Internal Medicine

## 2020-12-03 ENCOUNTER — Other Ambulatory Visit: Payer: Self-pay

## 2020-12-03 ENCOUNTER — Encounter (HOSPITAL_COMMUNITY)
Admission: RE | Disposition: A | Discharge status: Home / Self Care | Payer: Self-pay | Source: Home / Self Care | Attending: Cardiology

## 2020-12-03 DIAGNOSIS — I480 Paroxysmal atrial fibrillation: Secondary | ICD-10-CM

## 2020-12-03 DIAGNOSIS — I10 Essential (primary) hypertension: Secondary | ICD-10-CM | POA: Insufficient documentation

## 2020-12-03 DIAGNOSIS — Z79899 Other long term (current) drug therapy: Secondary | ICD-10-CM | POA: Insufficient documentation

## 2020-12-03 DIAGNOSIS — I420 Dilated cardiomyopathy: Secondary | ICD-10-CM | POA: Diagnosis not present

## 2020-12-03 DIAGNOSIS — Z79890 Hormone replacement therapy (postmenopausal): Secondary | ICD-10-CM | POA: Insufficient documentation

## 2020-12-03 DIAGNOSIS — Z7984 Long term (current) use of oral hypoglycemic drugs: Secondary | ICD-10-CM | POA: Diagnosis not present

## 2020-12-03 DIAGNOSIS — I4819 Other persistent atrial fibrillation: Secondary | ICD-10-CM | POA: Diagnosis not present

## 2020-12-03 DIAGNOSIS — Z880 Allergy status to penicillin: Secondary | ICD-10-CM | POA: Diagnosis not present

## 2020-12-03 DIAGNOSIS — Z7901 Long term (current) use of anticoagulants: Secondary | ICD-10-CM | POA: Diagnosis not present

## 2020-12-03 DIAGNOSIS — E7800 Pure hypercholesterolemia, unspecified: Secondary | ICD-10-CM | POA: Diagnosis not present

## 2020-12-03 HISTORY — PX: CARDIOVERSION: SHX1299

## 2020-12-03 LAB — GLUCOSE, CAPILLARY: Glucose-Capillary: 125 mg/dL — ABNORMAL HIGH (ref 70–99)

## 2020-12-03 SURGERY — CARDIOVERSION
Anesthesia: General

## 2020-12-03 MED ORDER — PROPOFOL 10 MG/ML IV BOLUS
INTRAVENOUS | Status: DC | PRN
  Administered 2020-12-03: 50 mg via INTRAVENOUS

## 2020-12-03 MED ORDER — SODIUM CHLORIDE 0.9 % IV SOLN: INTRAVENOUS | Status: DC | PRN

## 2020-12-03 MED ORDER — LIDOCAINE 2% (20 MG/ML) 5 ML SYRINGE
INTRAMUSCULAR | Status: DC | PRN
  Administered 2020-12-03: 100 mg via INTRAVENOUS

## 2020-12-03 MED ORDER — SODIUM CHLORIDE 0.9 % IV SOLN: INTRAVENOUS | Status: DC

## 2020-12-03 NOTE — Interval H&P Note (Signed)
History and Physical Interval Note:  12/03/2020 8:26 AM  [name]  has presented today for surgery, with the diagnosis of AFIB.  The various methods of treatment have been discussed with the patient and family. After consideration of risks, benefits and other options for treatment, the patient has consented to  Procedure(s): CARDIOVERSION (N/A) as a surgical intervention.  The patient's history has been reviewed, patient examined, no change in status, stable for surgery.  I have reviewed the patient's chart and labs.  Questions were answered to the patient's satisfaction.     [employer]

## 2020-12-03 NOTE — Transfer of Care (Signed)
Immediate Anesthesia Transfer of Care Note  Patient: [name]  Procedure(s) Performed: CARDIOVERSION (N/A )  Patient Location: Endoscopy Unit  Anesthesia Type:General  Level of Consciousness: awake and alert   Airway & Oxygen Therapy: Patient Spontanous Breathing  Post-op Assessment: Report given to RN and Post -op Vital signs reviewed and stable  Post vital signs: Reviewed and stable  Last Vitals:  Vitals Value Taken Time  BP 122/69 12/03/20 0840  Temp    Pulse 48 12/03/20 0842  Resp 22 12/03/20 0842  SpO2 96 % 12/03/20 0842  Vitals shown include unvalidated device data.  Last Pain:  Vitals:   12/03/20 0737  TempSrc: Axillary  PainSc: 0-No pain         Complications: No complications documented.

## 2020-12-03 NOTE — Anesthesia Postprocedure Evaluation (Signed)
Anesthesia Post Note  Patient: [name]  Procedure(s) Performed: CARDIOVERSION (N/A )     Patient location during evaluation: PACU Anesthesia Type: General Level of consciousness: sedated Pain management: pain level controlled Vital Signs Assessment: post-procedure vital signs reviewed and stable Respiratory status: spontaneous breathing and respiratory function stable Cardiovascular status: stable Postop Assessment: no apparent nausea or vomiting Anesthetic complications: no   No complications documented.  Last Vitals:  Vitals:   12/03/20 0850 12/03/20 0902  BP: 124/66 125/61  Pulse: (!) 48 (!) 50  Resp: (!) 30 (!) 29  Temp:    SpO2: 100% 100%    Last Pain:  Vitals:   12/03/20 0902  TempSrc:   PainSc: 0-No pain                 [name]

## 2020-12-03 NOTE — Discharge Instructions (Signed)
Electrical Cardioversion Electrical cardioversion is the delivery of a jolt of electricity to restore a normal rhythm to the heart. A rhythm that is too fast or is not regular keeps the heart from pumping well. In this procedure, sticky patches or metal paddles are placed on the chest to deliver electricity to the heart from a device. This procedure may be done in an emergency if:  There is low or no blood pressure as a result of the heart rhythm.  Normal rhythm must be restored as fast as possible to protect the brain and heart from further damage.  It may save a life. This may also be a scheduled procedure for irregular or fast heart rhythms that are not immediately life-threatening. Tell a health care provider about:  Any allergies you have.  All medicines you are taking, including vitamins, herbs, eye drops, creams, and over-the-counter medicines.  Any problems you or family members have had with anesthetic medicines.  Any blood disorders you have.  Any surgeries you have had.  Any medical conditions you have.  Whether you are pregnant or may be pregnant. What are the risks? Generally, this is a safe procedure. However, problems may occur, including:  Allergic reactions to medicines.  A blood clot that breaks free and travels to other parts of your body.  The possible return of an abnormal heart rhythm within hours or days after the procedure.  Your heart stopping (cardiac arrest). This is rare. What happens before the procedure? Medicines  Your health care provider may have you start taking: ? Blood-thinning medicines (anticoagulants) so your blood does not clot as easily. ? Medicines to help stabilize your heart rate and rhythm.  Ask your health care provider about: ? Changing or stopping your regular medicines. This is especially important if you are taking diabetes medicines or blood thinners. ? Taking medicines such as aspirin and ibuprofen. These medicines can  thin your blood. Do not take these medicines unless your health care provider tells you to take them. ? Taking over-the-counter medicines, vitamins, herbs, and supplements. General instructions  Follow instructions from your health care provider about eating or drinking restrictions.  Plan to have someone take you home from the hospital or clinic.  If you will be going home right after the procedure, plan to have someone with you for 24 hours.  Ask your health care provider what steps will be taken to help prevent infection. These may include washing your skin with a germ-killing soap. What happens during the procedure?  An IV will be inserted into one of your veins.  Sticky patches (electrodes) or metal paddles may be placed on your chest.  You will be given a medicine to help you relax (sedative).  An electrical shock will be delivered. The procedure may vary among health care providers and hospitals.   What can I expect after the procedure?  Your blood pressure, heart rate, breathing rate, and blood oxygen level will be monitored until you leave the hospital or clinic.  Your heart rhythm will be watched to make sure it does not change.  You may have some redness on the skin where the shocks were given. Follow these instructions at home:  Do not drive for 24 hours if you were given a sedative during your procedure.  Take over-the-counter and prescription medicines only as told by your health care provider.  Ask your health care provider how to check your pulse. Check it often.  Rest for 48 hours after the procedure   or as told by your health care provider.  Avoid or limit your caffeine use as told by your health care provider.  Keep all follow-up visits as told by your health care provider. This is important. Contact a health care provider if:  You feel like your heart is beating too quickly or your pulse is not regular.  You have a serious muscle cramp that does not go  away. Get help right away if:  You have discomfort in your chest.  You are dizzy or you feel faint.  You have trouble breathing or you are short of breath.  Your speech is slurred.  You have trouble moving an arm or leg on one side of your body.  Your fingers or toes turn cold or blue. Summary  Electrical cardioversion is the delivery of a jolt of electricity to restore a normal rhythm to the heart.  This procedure may be done right away in an emergency or may be a scheduled procedure if the condition is not an emergency.  Generally, this is a safe procedure.  After the procedure, check your pulse often as told by your health care provider. This information is not intended to replace advice given to you by your health care provider. Make sure you discuss any questions you have with your health care provider. Document Revised: 06/02/2019 Document Reviewed: 06/02/2019 Elsevier Patient Education  2021 Elsevier Inc.  

## 2020-12-03 NOTE — CV Procedure (Signed)
    Electrical Cardioversion Procedure Note [name] [id number] 03/22/1931  Procedure: Electrical Cardioversion Indications:  Atrial Fibrillation  Time Out: Verified patient identification, verified procedure,medications/allergies/relevent history reviewed, required imaging and test results available.  Performed  Procedure Details  The patient was NPO after midnight. Anesthesia was administered at the beside  by Dr.Singer with 50mg  of propofol.  Cardioversion was performed with synchronized biphasic defibrillation via AP pads with 200 joules.  1 attempt(s) were performed.  The patient converted to normal sinus rhythm. The patient tolerated the procedure well   IMPRESSION:  Successful cardioversion of atrial fibrillation on [employer] 12/03/2020, 8:36 AM

## 2020-12-06 ENCOUNTER — Encounter (HOSPITAL_COMMUNITY): Payer: Self-pay | Admitting: Cardiology

## 2020-12-23 ENCOUNTER — Ambulatory Visit: Payer: Medicare PPO | Admitting: Internal Medicine

## 2020-12-23 ENCOUNTER — Other Ambulatory Visit: Payer: Self-pay

## 2020-12-23 ENCOUNTER — Encounter: Payer: Self-pay | Admitting: Internal Medicine

## 2020-12-23 VITALS — BP 132/74 | HR 56 | Ht 65.5 in | Wt 172.0 lb

## 2020-12-23 DIAGNOSIS — I5032 Chronic diastolic (congestive) heart failure: Secondary | ICD-10-CM

## 2020-12-23 DIAGNOSIS — I4819 Other persistent atrial fibrillation: Secondary | ICD-10-CM | POA: Diagnosis not present

## 2020-12-23 DIAGNOSIS — I10 Essential (primary) hypertension: Secondary | ICD-10-CM | POA: Diagnosis not present

## 2020-12-23 NOTE — Patient Instructions (Signed)
Medication Instructions:  Your physician recommends that you continue on your current medications as directed. Please refer to the Current Medication list given to you today.  Labwork: TSH, CBC, BMP, MAG  Testing/Procedures: None ordered.  Follow-Up: Your physician wants you to follow-up in: 3 months in the Afib Clinic. They will call you to schedule.     Any Other Special Instructions Will Be Listed Below (If Applicable).  If you need a refill on your cardiac medications before your next appointment, please call your pharmacy.

## 2020-12-23 NOTE — Progress Notes (Signed)
PCP: Lavone Orn, MD   Primary EP: Dr Sandria Senter I Kuennen is a 87 y.o. female who presents today for routine electrophysiology followup.  Since last being seen in our clinic, the patient reports doing reasonably well.  She feels "much better" in sinus rhythm.  We have had to reduce her tikosyn due to prolonged QT. Today, she denies symptoms of palpitations, chest pain, shortness of breath,  lower extremity edema, dizziness, presyncope, or syncope.  The patient is otherwise without complaint today.   Past Medical History:  Diagnosis Date  . Acute bronchitis 11/15/2017  . ADENOCARCINOMA, BREAST 10/13/2010   Qualifier: Diagnosis of  By: Nils Pyle CMA (AAMA), Mearl Latin    . 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 Allergy    SEASONAL  . Anemia    years ago  . Anticoagulated by anticoagulation treatment - Eliquis 03/30/2014   Started May 2015   . Anxiety state 10/13/2010   Qualifier: Diagnosis of  By: Nils Pyle CMA ([place]), Mearl Latin    . Arthritis    "all my joints" (03/31/2014)  . Barrett's esophagus   . Breast cancer [facility])    s/p right breast lumpectomy  . CARDIAC MURMUR 10/13/2010   Qualifier: Diagnosis of  By: Nils Pyle CMA ([place]), Mearl Latin    . 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 Cervical cancer ([place])   . Chronic cough 01/02/2017   GERD, hiatal hernia [place] amio  . COLONIC POLYPS, ADENOMATOUS, HX OF 10/13/2010   Qualifier: Diagnosis of  By: Nils Pyle CMA (AAMA), Mearl Latin    . Degenerative disorder of eye 07/25/2012  . Diabetes mellitus without complication ([place])   . Dilated cardiomyopathy ([place]) 08/27/2018  . DIVERTICULOSIS, COLON 10/13/2010   Qualifier: Diagnosis of  By: Nils Pyle CMA (AAMA), Mearl Latin    . DJD (degenerative joint disease) of knee   . Dysrhythmia    Afib  . Echocardiogram 07/2020    Echo 9/21: EF 70-75, moderate LVH, GR 1 DD, RVSP 40.5 (mildly elevated), normal RVSF, moderate LAE, mild RAE, mild MR, trivial AI  . EROSIVE ESOPHAGITIS 10/13/2010   Qualifier: Diagnosis of   By: Nils Pyle CMA ([place]), Mearl Latin    . Error, refractive, myopia 07/09/2012  . Essential hypertension 10/13/2010   Qualifier: Diagnosis of  By: Nils Pyle CMA ([place]), Mearl Latin    . Exotropia, intermittent 07/09/2012  . GERD 10/14/2010   Qualifier: Diagnosis of  By: Sharlett Iles MD Byrd Hesselbach GERD (gastroesophageal reflux disease)   . Glaucoma   . H/O hiatal hernia   . Heart murmur   . Hiatal hernia s/p robotic repair & fundoplication 0/92/3300 76/12/2631   Qualifier: Diagnosis of  By: Nils Pyle CMA (AAMA), Mearl Latin    . History of laser assisted in situ keratomileusis 07/25/2012  . Hx of transesophageal echocardiography (TEE) for monitoring    TEE (03/2014): No LAA clot, moderate LAE, core triatriatum type structure in RA with no stenosis, normal EF 55-60%, mild MR  . HYPERCHOLESTEROLEMIA 10/13/2010   Qualifier: Diagnosis of  By: Nils Pyle CMA ([place]), Mearl Latin    . Hypertension   . Hypothyroidism, postsurgical   . Iron Deficiency Anemia 10/14/2010   Qualifier: Diagnosis of  By: Sharlett Iles MD Byrd Hesselbach   . Irritable bowel syndrome 10/13/2010   Qualifier: Diagnosis of  By: Nils Pyle CMA (AAMA), Mearl Latin    . Lumbar stenosis with neurogenic claudication 04/20/2016  . Malignant neoplasm of breast ([place]) 02/25/2016  . Migraine    "last one was years ago" (03/31/2014)  . MIGRAINE HEADACHE 10/13/2010  Qualifier: Diagnosis of  By: Nils Pyle CMA ([place]), Mearl Latin    . 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 OSA on CPAP    settings at 10-11   . OSTEOARTHRITIS 10/13/2010   Qualifier: Diagnosis of  By: Nils Pyle CMA ([place]), Mearl Latin    . Persistent atrial fibrillation 03/31/2015   DLCO dropped from 100% in 2015-50% in 01/2017  . Post-surgical hypothyroidism 04/09/2014  . RECTAL FISSURE 10/13/2010   Qualifier: Diagnosis of  By: Nils Pyle CMA ([place]), Mearl Latin    . S/P Nissen fundoplication (without gastrostomy tube) procedure 05/25/2017  . Sleep apnea   . THYROID CANCER 10/13/2010   Qualifier: Diagnosis of  By: Nils Pyle CMA  ([place]), Mearl Latin    . Thyroid cancer [facility])    s/p thyroidectomy  . Type II diabetes mellitus ([place])    type II    Past Surgical History:  Procedure Laterality Date  . ABDOMINAL HYSTERECTOMY     "partial"  . BREAST BIOPSY Right   . BREAST LUMPECTOMY Right   . CARDIOVERSION N/A 03/30/2014   Procedure: CARDIOVERSION - BEDSIDE;  Surgeon: [name], MD;  Location: [place];  Service: Cardiovascular;  Laterality: N/A;  . CARDIOVERSION N/A 03/31/2014   Procedure: CARDIOVERSION  (BEDSIDE) ;  Surgeon: Lelon Perla, MD;  Location: [place];  Service: Cardiovascular;  Laterality: N/A;  . CARDIOVERSION N/A 04/27/2014   Procedure: CARDIOVERSION;  Surgeon: Darlin Coco, MD;  Location: [place];  Service: Cardiovascular;  Laterality: N/A;  . CARDIOVERSION N/A 04/13/2015   Procedure: CARDIOVERSION;  Surgeon: Thayer Headings, MD;  Location: [place];  Service: Cardiovascular;  Laterality: N/A;  . CARDIOVERSION N/A 01/08/2017   Procedure: CARDIOVERSION;  Surgeon: Sanda Klein, MD;  Location: [facility] ENDOSCOPY;  Service: Cardiovascular;  Laterality: N/A;  . CARDIOVERSION N/A 09/19/2017   Procedure: CARDIOVERSION;  Surgeon: [name], MD;  Location: [place];  Service: Cardiovascular;  Laterality: N/A;  . CARDIOVERSION N/A 10/25/2017   Procedure: CARDIOVERSION;  Surgeon: Acie Fredrickson Wonda Cheng, MD;  Location: [place];  Service: Cardiovascular;  Laterality: N/A;  . CARDIOVERSION N/A 12/27/2018   Procedure: CARDIOVERSION;  Surgeon: [name], MD;  Location: [place];  Service: Cardiovascular;  Laterality: N/A;  . CARDIOVERSION N/A 05/20/2019   Procedure: CARDIOVERSION;  Surgeon: [name], MD;  Location: [place];  Service: Cardiovascular;  Laterality: N/A;  . CARDIOVERSION N/A 12/10/2019   Procedure: CARDIOVERSION;  Surgeon: Pixie Casino, MD;  Location: [facility] ENDOSCOPY;  Service: Cardiovascular;  Laterality: N/A;  . CARDIOVERSION N/A 12/31/2019   Procedure:  CARDIOVERSION;  Surgeon: Sanda Klein, MD;  Location: [facility] ENDOSCOPY;  Service: Cardiovascular;  Laterality: N/A;  . CARDIOVERSION N/A 10/21/2020   Procedure: CARDIOVERSION;  Surgeon: [name], MD;  Location: [place];  Service: Cardiovascular;  Laterality: N/A;  . CARDIOVERSION N/A 12/03/2020   Procedure: CARDIOVERSION;  Surgeon: Jerline Pain, MD;  Location: [place];  Service: Cardiovascular;  Laterality: N/A;  . CARPAL TUNNEL RELEASE Right   . CATARACT EXTRACTION W/ INTRAOCULAR LENS  IMPLANT, BILATERAL Bilateral   . COLONOSCOPY    . EXCISIONAL HEMORRHOIDECTOMY    . INSERTION OF MESH N/A 05/25/2017   Procedure: INSERTION OF MESH;  Surgeon: Ralene Ok, MD;  Location: [facility];  Service: General;  Laterality: N/A;  . TEE WITHOUT CARDIOVERSION N/A 03/30/2014   Procedure: TRANSESOPHAGEAL ECHOCARDIOGRAM (TEE);  Surgeon: [name], MD;  Location[facility] ENDOSCOPY;  Service: Cardiovascular;  Laterality: N/A;  . THYROIDECTOMY    . TONSILLECTOMY  ROS- all systems are reviewed and negatives except as per HPI above  Current Outpatient Medications  Medication Sig Dispense Refill  . ACCU-CHEK GUIDE test strip     . acetaminophen (TYLENOL) 650 MG CR tablet Take 650 mg by mouth in the morning, at noon, and at bedtime.    . Blood Glucose Monitoring Suppl (ACCU-CHEK GUIDE ME) w/Device KIT     . Calcium Citrate (CITRACAL PO) Take 800 mg by mouth daily.     . cetirizine (ZYRTEC) 10 MG tablet Take 10 mg by mouth daily.    . Cholecalciferol (DIALYVITE VITAMIN D 5000) 125 MCG (5000 UT) capsule Take 5,000 Units by mouth daily.    Marland Kitchen dofetilide (TIKOSYN) 125 MCG capsule Take 1 capsule (125 mcg total) by mouth 2 (two) times daily. 60 capsule 11  . ELIQUIS 5 MG TABS tablet TAKE 1 TABLET TWICE DAILY (Patient taking differently: Take 5 mg by mouth 2 (two) times daily.) 180 tablet 1  . ferrous sulfate 325 (65 FE) MG tablet Take 325 mg by mouth daily with breakfast.    . fluticasone  (FLONASE) 50 MCG/ACT nasal spray Place 1 spray into both nostrils at bedtime as needed for allergies.     Marland Kitchen lansoprazole (PREVACID) 30 MG capsule Take 30 mg by mouth daily as needed (acid reflux).     Marland Kitchen levothyroxine (SYNTHROID) 112 MCG tablet Take 112 mcg by mouth at bedtime.    Marland Kitchen LORazepam (ATIVAN) 1 MG tablet Take 1.5 mg by mouth at bedtime.     . Lutein 6 MG CAPS Take 6 mg by mouth daily.     . magnesium oxide (MAG-OX) 400 (241.3 Mg) MG tablet Take 1 tablet (400 mg total) by mouth 2 (two) times daily. 60 tablet 3  . metFORMIN (GLUCOPHAGE-XR) 500 MG 24 hr tablet Take 500 mg by mouth 2 (two) times daily.    . metoprolol tartrate (LOPRESSOR) 25 MG tablet Take 25 mg by mouth 2 (two) times daily.    . Multiple Vitamins-Minerals (PRESERVISION AREDS 2) CAPS Take 1 capsule by mouth daily.    . Multiple Vitamins-Minerals (ZINC PO) Take 0.5 tablets by mouth 2 (two) times a week.     . Polyvinyl Alcohol-Povidone PF 1.4-0.6 % SOLN Place 1 drop into both eyes daily as needed (dry eyes).     . potassium chloride SA (KLOR-CON) 20 MEQ tablet Take 2 tablets (40 mEq total) by mouth daily. 90 tablet 1  . rosuvastatin (CRESTOR) 10 MG tablet Take 10 mg by mouth at bedtime.    . sertraline (ZOLOFT) 50 MG tablet Take 50 mg by mouth daily.    . vitamin B-12 (CYANOCOBALAMIN) 1000 MCG tablet Take 1,000 mcg by mouth daily.     No current facility-administered medications for this visit.    Physical Exam: Vitals:   12/23/20 1212  BP: 132/74  Pulse: (!) 56  SpO2: 98%  Weight: 172 lb (78 kg)  Height: 5' 5.5" (1.664 m)    GEN- The patient is elderly appearing, alert and oriented x 3 today.   Head- normocephalic, atraumatic Eyes-  Sclera clear, conjunctiva pink Ears- hearing intact Oropharynx- clear Lungs- Clear to ausculation bilaterally, normal work of breathing Heart- Regular rate and rhythm, no murmurs, rubs or gallops, PMI not laterally displaced GI- soft, NT, ND, + BS Extremities- no clubbing,  cyanosis, or edema  Wt Readings from Last 3 Encounters:  12/23/20 172 lb (78 kg)  12/03/20 174 lb (78.9 kg)  11/26/20 175 lb (79.4 kg)  EKG tracing ordered today is personally reviewed and shows sinus rhythm 59 bpm, Qtc 500 msec  Assessment and Plan:  1. Persistent afib Doing well with tikosyn, though recently dose had to be decreased due to prolonged QT.  She subsequently required cardioversion. Qt is stable We will follow her closely on tikosyn to avoid toxicity. Continue xarelto for chads2vasc score of at least 6  2. HTN Stable No change required today  3. Chronic diastolic dysfunction Stable No change required today   Risks, benefits and potential toxicities for medications prescribed and/or refilled reviewed with patient today.   Follow closely in the AF clinic  Thompson Grayer MD, [facility] 12/23/2020 1:02 PM

## 2020-12-24 LAB — CBC
Hematocrit: 39.9 % (ref 34.0–46.6)
Hemoglobin: 13.4 g/dL (ref 11.1–15.9)
MCH: 32.1 pg (ref 26.6–33.0)
MCHC: 33.6 g/dL (ref 31.5–35.7)
MCV: 96 fL (ref 79–97)
Platelets: 173 10*3/uL (ref 150–450)
RBC: 4.18 x10E6/uL (ref 3.77–5.28)
RDW: 12.2 % (ref 11.7–15.4)
WBC: 5.4 10*3/uL (ref 3.4–10.8)

## 2020-12-24 LAB — BASIC METABOLIC PANEL
BUN/Creatinine Ratio: 29 — ABNORMAL HIGH (ref 12–28)
BUN: 20 mg/dL (ref 8–27)
CO2: 23 mmol/L (ref 20–29)
Calcium: 9.5 mg/dL (ref 8.7–10.3)
Chloride: 104 mmol/L (ref 96–106)
Creatinine, Ser: 0.7 mg/dL (ref 0.57–1.00)
GFR calc Af Amer: 90 mL/min/{1.73_m2} (ref >59)
GFR calc non Af Amer: 78 mL/min/{1.73_m2} (ref >59)
Glucose: 122 mg/dL — ABNORMAL HIGH (ref 65–99)
Potassium: 4.2 mmol/L (ref 3.5–5.2)
Sodium: 142 mmol/L (ref 134–144)

## 2020-12-24 LAB — MAGNESIUM: Magnesium: 2.1 mg/dL (ref 1.6–2.3)

## 2020-12-24 LAB — TSH: TSH: 1.66 u[IU]/mL (ref 0.450–4.500)

## 2021-01-03 ENCOUNTER — Other Ambulatory Visit (HOSPITAL_COMMUNITY): Payer: Self-pay | Admitting: Nurse Practitioner

## 2021-01-10 ENCOUNTER — Encounter (INDEPENDENT_AMBULATORY_CARE_PROVIDER_SITE_OTHER): Payer: Self-pay

## 2021-01-10 ENCOUNTER — Encounter: Payer: Self-pay | Admitting: Cardiovascular Disease

## 2021-01-10 ENCOUNTER — Other Ambulatory Visit: Payer: Self-pay

## 2021-01-10 ENCOUNTER — Ambulatory Visit: Payer: Medicare PPO | Admitting: Cardiovascular Disease

## 2021-01-10 VITALS — BP 130/76 | HR 107 | Ht 66.5 in | Wt 176.2 lb

## 2021-01-10 DIAGNOSIS — I5032 Chronic diastolic (congestive) heart failure: Secondary | ICD-10-CM | POA: Diagnosis not present

## 2021-01-10 DIAGNOSIS — I4819 Other persistent atrial fibrillation: Secondary | ICD-10-CM

## 2021-01-10 DIAGNOSIS — I10 Essential (primary) hypertension: Secondary | ICD-10-CM | POA: Diagnosis not present

## 2021-01-10 LAB — CBC WITH DIFFERENTIAL/PLATELET
Basophils Absolute: 0 10*3/uL (ref 0.0–0.2)
Basos: 1 %
EOS (ABSOLUTE): 0.3 10*3/uL (ref 0.0–0.4)
Eos: 4 %
Hematocrit: 39.2 % (ref 34.0–46.6)
Hemoglobin: 13.3 g/dL (ref 11.1–15.9)
Lymphocytes Absolute: 1.3 10*3/uL (ref 0.7–3.1)
Lymphs: 23 %
MCH: 31.7 pg (ref 26.6–33.0)
MCHC: 33.9 g/dL (ref 31.5–35.7)
MCV: 93 fL (ref 79–97)
Monocytes Absolute: 0.6 10*3/uL (ref 0.1–0.9)
Monocytes: 10 %
Neutrophils Absolute: 3.6 10*3/uL (ref 1.4–7.0)
Neutrophils: 62 %
Platelets: 193 10*3/uL (ref 150–450)
RBC: 4.2 x10E6/uL (ref 3.77–5.28)
RDW: 13.2 % (ref 11.7–15.4)
WBC: 5.8 10*3/uL (ref 3.4–10.8)

## 2021-01-10 LAB — BASIC METABOLIC PANEL
BUN/Creatinine Ratio: 23 (ref 12–28)
BUN: 17 mg/dL (ref 8–27)
CO2: 26 mmol/L (ref 20–29)
Calcium: 9.5 mg/dL (ref 8.7–10.3)
Chloride: 105 mmol/L (ref 96–106)
Creatinine, Ser: 0.74 mg/dL (ref 0.57–1.00)
Glucose: 107 mg/dL — ABNORMAL HIGH (ref 65–99)
Potassium: 4.9 mmol/L (ref 3.5–5.2)
Sodium: 141 mmol/L (ref 134–144)
eGFR: 78 mL/min/{1.73_m2} (ref >59)

## 2021-01-10 MED ORDER — METOPROLOL TARTRATE 50 MG PO TABS: 25.0000 mg | ORAL_TABLET | Freq: Two times a day (BID) | ORAL | 3 refills | Status: DC

## 2021-01-10 NOTE — Progress Notes (Signed)
DCCV scheduled 01/17/21 at noon with Dr. Debara Pickett Dx: Afib Case ID # [id number] Arrival time 1100 for 1200 case Labs updated today Covid test scheduled 01/15/21 Follow-up in AFib Clinic scheduled 01/26/21

## 2021-01-10 NOTE — Progress Notes (Signed)
Cardiology Office Note:    Date:  01/10/2021   ID:  [name], DOB September 21, 1931, MRN [id number]  PCP:  Lavone Orn, [place]  Cardiologist:  Sherren Mocha, MD  Advanced Practice Provider:  No care team member to display Electrophysiologist:  None       Referring MD: Lavone Orn, MD   Chief Complaint  Patient presents with  . Atrial Fibrillation    History of Present Illness:    [name] is a 87 y.o. female with a long history of atrial fibrillation, presenting for follow-up evaluation.  The patient has always been very symptomatic with her atrial fibrillation.  She has been followed closely in the atrial fibrillation clinic.  The patient has undergone multiple cardioversions in the past, both with and without antiarrhythmic drug therapy.  Medications have been adjusted based on her heart rate and she has had to reduce the dose of beta-blocker especially when she was maintained on chronic amiodarone.  She ultimately failed amiodarone with recurrent episodes of atrial fibrillation.  This was ultimately discontinued.  With recurrent atrial fibrillation she was started on Tikosyn in January 2021.  She has undergone cardioversion since that time.  She was just seen by Dr. Rayann Heman about 2 weeks ago when she was in sinus rhythm and feeling quite well.  Unfortunately, since that time she has began to feel poorly again and is now back in atrial fibrillation today.  She is here with her daughter today.  She complains of feeling fatigued and weak.  She thinks she has been in atrial fibrillation for 2 or 3 days.  She is short of breath with activity.  No chest pain, chest pressure, edema, orthopnea, PND, lightheadedness, or syncope.  Past Medical History:  Diagnosis Date  . Acute bronchitis 11/15/2017  . ADENOCARCINOMA, BREAST 10/13/2010   Qualifier: Diagnosis of  By: Nils Pyle CMA (AAMA), Mearl Latin    . 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  Allergy    SEASONAL  . Anemia    years ago  . Anticoagulated by anticoagulation treatment - Eliquis 03/30/2014   Started May 2015   . Anxiety state 10/13/2010   Qualifier: Diagnosis of  By: Nils Pyle CMA ([place]), Mearl Latin    . Arthritis    "all my joints" (03/31/2014)  . Barrett's esophagus   . Breast cancer [facility])    s/p right breast lumpectomy  . CARDIAC MURMUR 10/13/2010   Qualifier: Diagnosis of  By: Nils Pyle CMA ([place]), Mearl Latin    . 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 Cervical cancer ([place])   . Chronic cough 01/02/2017   GERD, hiatal hernia [place] amio  . COLONIC POLYPS, ADENOMATOUS, HX OF 10/13/2010   Qualifier: Diagnosis of  By: Nils Pyle CMA (AAMA), Mearl Latin    . Degenerative disorder of eye 07/25/2012  . Diabetes mellitus without complication ([place])   . Dilated cardiomyopathy ([place]) 08/27/2018  . DIVERTICULOSIS, COLON 10/13/2010   Qualifier: Diagnosis of  By: Nils Pyle CMA (AAMA), Mearl Latin    . DJD (degenerative joint disease) of knee   . Dysrhythmia    Afib  . Echocardiogram 07/2020    Echo 9/21: EF 70-75, moderate LVH, GR 1 DD, RVSP 40.5 (mildly elevated), normal RVSF, moderate LAE, mild RAE, mild MR, trivial AI  . EROSIVE ESOPHAGITIS 10/13/2010   Qualifier: Diagnosis of  By: Nils Pyle CMA ([place]), Mearl Latin    . Error, refractive, myopia 07/09/2012  . Essential hypertension 10/13/2010   Qualifier: Diagnosis of  By: Nils Pyle  CMA (AAMA), Mearl Latin    . Exotropia, intermittent 07/09/2012  . GERD 10/14/2010   Qualifier: Diagnosis of  By: Sharlett Iles MD Byrd Hesselbach GERD (gastroesophageal reflux disease)   . Glaucoma   . H/O hiatal hernia   . Heart murmur   . Hiatal hernia s/p robotic repair & fundoplication 1/61/0960 45/02/980   Qualifier: Diagnosis of  By: Nils Pyle CMA (AAMA), Mearl Latin    . History of laser assisted in situ keratomileusis 07/25/2012  . Hx of transesophageal echocardiography (TEE) for monitoring    TEE (03/2014): No LAA clot, moderate LAE, core triatriatum type structure in RA with no stenosis,  normal EF 55-60%, mild MR  . HYPERCHOLESTEROLEMIA 10/13/2010   Qualifier: Diagnosis of  By: Nils Pyle CMA ([place]), Mearl Latin    . Hypertension   . Hypothyroidism, postsurgical   . Iron Deficiency Anemia 10/14/2010   Qualifier: Diagnosis of  By: Sharlett Iles MD Byrd Hesselbach   . Irritable bowel syndrome 10/13/2010   Qualifier: Diagnosis of  By: Nils Pyle CMA (AAMA), Mearl Latin    . Lumbar stenosis with neurogenic claudication 04/20/2016  . Malignant neoplasm of breast ([place]) 02/25/2016  . Migraine    "last one was years ago" (03/31/2014)  . MIGRAINE HEADACHE 10/13/2010   Qualifier: Diagnosis of  By: Nils Pyle CMA ([place]), Mearl Latin    . 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 OSA on CPAP    settings at 10-11   . OSTEOARTHRITIS 10/13/2010   Qualifier: Diagnosis of  By: Nils Pyle CMA ([place]), Mearl Latin    . Persistent atrial fibrillation 03/31/2015   DLCO dropped from 100% in 2015-50% in 01/2017  . Post-surgical hypothyroidism 04/09/2014  . RECTAL FISSURE 10/13/2010   Qualifier: Diagnosis of  By: Nils Pyle CMA ([place]), Mearl Latin    . S/P Nissen fundoplication (without gastrostomy tube) procedure 05/25/2017  . Sleep apnea   . THYROID CANCER 10/13/2010   Qualifier: Diagnosis of  By: Nils Pyle CMA ([place]), Mearl Latin    . Thyroid cancer [facility])    s/p thyroidectomy  . Type II diabetes mellitus ([place])    type II     Past Surgical History:  Procedure Laterality Date  . ABDOMINAL HYSTERECTOMY     "partial"  . BREAST BIOPSY Right   . BREAST LUMPECTOMY Right   . CARDIOVERSION N/A 03/30/2014   Procedure: CARDIOVERSION - BEDSIDE;  Surgeon: [name], MD;  Location: [place];  Service: Cardiovascular;  Laterality: N/A;  . CARDIOVERSION N/A 03/31/2014   Procedure: CARDIOVERSION  (BEDSIDE) ;  Surgeon: Lelon Perla, MD;  Location: [place];  Service: Cardiovascular;  Laterality: N/A;  . CARDIOVERSION N/A 04/27/2014   Procedure: CARDIOVERSION;  Surgeon: Darlin Coco, MD;  Location: [place];  Service: Cardiovascular;  Laterality:  N/A;  . CARDIOVERSION N/A 04/13/2015   Procedure: CARDIOVERSION;  Surgeon: Thayer Headings, MD;  Location: [place];  Service: Cardiovascular;  Laterality: N/A;  . CARDIOVERSION N/A 01/08/2017   Procedure: CARDIOVERSION;  Surgeon: Sanda Klein, MD;  Location: [facility] ENDOSCOPY;  Service: Cardiovascular;  Laterality: N/A;  . CARDIOVERSION N/A 09/19/2017   Procedure: CARDIOVERSION;  Surgeon: [name], MD;  Location: [facility] ENDOSCOPY;  Service: Cardiovascular;  Laterality: N/A;  . CARDIOVERSION N/A 10/25/2017   Procedure: CARDIOVERSION;  Surgeon: Thayer Headings, MD;  Location: [facility] ENDOSCOPY;  Service: Cardiovascular;  Laterality: N/A;  . CARDIOVERSION N/A 12/27/2018   Procedure: CARDIOVERSION;  Surgeon: [name], MD;  Location: [facility] ENDOSCOPY;  Service: Cardiovascular;  Laterality: N/A;  . CARDIOVERSION  N/A 05/20/2019   Procedure: CARDIOVERSION;  Surgeon: [name], MD;  Location: [place];  Service: Cardiovascular;  Laterality: N/A;  . CARDIOVERSION N/A 12/10/2019   Procedure: CARDIOVERSION;  Surgeon: Pixie Casino, MD;  Location: [facility] ENDOSCOPY;  Service: Cardiovascular;  Laterality: N/A;  . CARDIOVERSION N/A 12/31/2019   Procedure: CARDIOVERSION;  Surgeon: Sanda Klein, MD;  Location: [place] ENDOSCOPY;  Service: Cardiovascular;  Laterality: N/A;  . CARDIOVERSION N/A 10/21/2020   Procedure: CARDIOVERSION;  Surgeon: [name], MD;  Location: [place];  Service: Cardiovascular;  Laterality: N/A;  . CARDIOVERSION N/A 12/03/2020   Procedure: CARDIOVERSION;  Surgeon: Jerline Pain, MD;  Location: [place];  Service: Cardiovascular;  Laterality: N/A;  . CARPAL TUNNEL RELEASE Right   . CATARACT EXTRACTION W/ INTRAOCULAR LENS  IMPLANT, BILATERAL Bilateral   . COLONOSCOPY    . EXCISIONAL HEMORRHOIDECTOMY    . INSERTION OF MESH N/A 05/25/2017   Procedure: INSERTION OF MESH;  Surgeon: Ralene Ok, MD;  Location: [facility];  Service: General;  Laterality: N/A;  . TEE  WITHOUT CARDIOVERSION N/A 03/30/2014   Procedure: TRANSESOPHAGEAL ECHOCARDIOGRAM (TEE);  Surgeon: [name], MD;  Location: [facility] ENDOSCOPY;  Service: Cardiovascular;  Laterality: N/A;  . THYROIDECTOMY    . TONSILLECTOMY      Current Medications: Current Meds  Medication Sig  . ACCU-CHEK GUIDE test strip   . acetaminophen (TYLENOL) 650 MG CR tablet Take 650 mg by mouth in the morning, at noon, and at bedtime.  . Blood Glucose Monitoring Suppl (ACCU-CHEK GUIDE ME) w/Device KIT   . Calcium Citrate (CITRACAL PO) Take 800 mg by mouth daily.   . cetirizine (ZYRTEC) 10 MG tablet Take 10 mg by mouth daily.  . Cholecalciferol (DIALYVITE VITAMIN D 5000) 125 MCG (5000 UT) capsule Take 5,000 Units by mouth daily.  Marland Kitchen dofetilide (TIKOSYN) 125 MCG capsule Take 1 capsule (125 mcg total) by mouth 2 (two) times daily.  Marland Kitchen ELIQUIS 5 MG TABS tablet TAKE 1 TABLET TWICE DAILY  . ferrous sulfate 325 (65 FE) MG tablet Take 325 mg by mouth daily with breakfast.  . fluticasone (FLONASE) 50 MCG/ACT nasal spray Place 1 spray into both nostrils at bedtime as needed for allergies.   Marland Kitchen lansoprazole (PREVACID) 30 MG capsule Take 30 mg by mouth daily as needed (acid reflux).   Marland Kitchen levothyroxine (SYNTHROID) 112 MCG tablet Take 112 mcg by mouth at bedtime.  Marland Kitchen LORazepam (ATIVAN) 1 MG tablet Take 1.5 mg by mouth at bedtime.   . Lutein 6 MG CAPS Take 6 mg by mouth daily.   . magnesium oxide (MAG-OX) 400 (241.3 Mg) MG tablet Take 1 tablet (400 mg total) by mouth 2 (two) times daily.  . metFORMIN (GLUCOPHAGE-XR) 500 MG 24 hr tablet Take 500 mg by mouth 2 (two) times daily.  . Multiple Vitamins-Minerals (PRESERVISION AREDS 2) CAPS Take 1 capsule by mouth daily.  . Multiple Vitamins-Minerals (ZINC PO) Take 0.5 tablets by mouth 2 (two) times a week.   . Polyvinyl Alcohol-Povidone PF 1.4-0.6 % SOLN Place 1 drop into both eyes daily as needed (dry eyes).   . potassium chloride SA (KLOR-CON) 20 MEQ tablet Take 2 tablets (40 mEq total)  by mouth daily.  . rosuvastatin (CRESTOR) 10 MG tablet Take 10 mg by mouth at bedtime.  . sertraline (ZOLOFT) 50 MG tablet Take 50 mg by mouth daily.  . vitamin B-12 (CYANOCOBALAMIN) 1000 MCG tablet Take 1,000 mcg by mouth daily.  . [DISCONTINUED] metoprolol tartrate (LOPRESSOR) 25  MG tablet Take 25 mg by mouth 2 (two) times daily.     Allergies:   Penicillins   Social History   Socioeconomic History  . Marital status: Divorced    Spouse name: Not on file  . Number of children: 2  . Years of education: Not on file  . Highest education level: Not on file  Occupational History  . Occupation: Retired    Comment: Education officer, museum  Tobacco Use  . Smoking status: Never Smoker  . Smokeless tobacco: Never Used  Vaping Use  . Vaping Use: Never used  Substance and Sexual Activity  . Alcohol use: No    Alcohol/week: 0.0 standard drinks  . Drug use: No  . Sexual activity: Never  Other Topics Concern  . Not on file  Social History Narrative  . Not on file   Social Determinants of Health   Financial Resource Strain: Not on file  Food Insecurity: Not on file  Transportation Needs: Not on file  Physical Activity: Not on file  Stress: Not on file  Social Connections: Not on file     Family History: The patient's family history includes Diabetes in her brother, brother, brother, brother, brother, brother, brother, child, sister, sister, and sister; Heart attack in her brother; Heart disease in her brother, brother, brother, brother, brother, brother, and sister; Pancreatic cancer in her sister; Prostate cancer in her father.  ROS:   Please see the history of present illness.    All other systems reviewed and are negative.  EKGs/Labs/Other Studies Reviewed:    The following studies were reviewed today: Echo 07/21/20: IMPRESSIONS    1. Left ventricular ejection fraction, by estimation, is 70 to 75%. The  left ventricle has hyperdynamic function. The left ventricle has no   regional wall motion abnormalities. There is moderate left ventricular  hypertrophy. Left ventricular diastolic  parameters are consistent with Grade I diastolic dysfunction (impaired  relaxation).  2. Right ventricular systolic function is normal. The right ventricular  size is normal. There is mildly elevated pulmonary artery systolic  pressure.  3. Left atrial size was moderately dilated.  4. Right atrial size was mildly dilated.  5. The mitral valve is grossly normal. Mild mitral valve regurgitation.  No evidence of mitral stenosis. Severe mitral annular calcification.  6. The aortic valve is tricuspid. There is moderate calcification of the  aortic valve. There is moderate thickening of the aortic valve. Aortic  valve regurgitation is trivial. No aortic stenosis is present. Aortic  regurgitation PHT measures 474 msec.  7. The inferior vena cava is normal in size with greater than 50%  respiratory variability, suggesting right atrial pressure of 3 mmHg.   EKG:  EKG is ordered today.  The ekg ordered today demonstrates atrial fibrillation 107 bpm, QT 384, QTc 512.  Recent Labs: 12/23/2020: BUN 20; Creatinine, Ser 0.70; Hemoglobin 13.4; Magnesium 2.1; Platelets 173; Potassium 4.2; Sodium 142; TSH 1.660  Recent Lipid Panel No results found for: CHOL, TRIG, HDL, CHOLHDL, VLDL, LDLCALC, LDLDIRECT   Risk Assessment/Calculations:    CHA2DS2-VASc Score = 6  This indicates a 9.7% annual risk of stroke. The patient's score is based upon: CHF History: Yes HTN History: Yes Diabetes History: Yes Stroke History: No Vascular Disease History: No Age Score: 2 Gender Score: 1      Physical Exam:    VS:  BP 130/76   Pulse (!) 107   Ht 5' 6.5" (1.689 m)   Wt 176 lb 3.2 oz (79.9 kg)  LMP  (LMP Unknown)   SpO2 96%   BMI 28.01 kg/m     Wt Readings from Last 3 Encounters:  01/10/21 176 lb 3.2 oz (79.9 kg)  12/23/20 172 lb (78 kg)  12/03/20 174 lb (78.9 kg)     GEN:   Well nourished, well developed pleasant elderly woman in no acute distress HEENT: Normal NECK: No JVD; No carotid bruits LYMPHATICS: No lymphadenopathy CARDIAC: Irregularly irregular, 2/6 systolic murmur at the right upper sternal border RESPIRATORY:  Clear to auscultation without rales, wheezing or rhonchi  ABDOMEN: Soft, non-tender, non-distended MUSCULOSKELETAL: Mild bilateral ankle edema; No deformity  SKIN: Warm and dry NEUROLOGIC:  Alert and oriented x 3 PSYCHIATRIC:  Normal affect   ASSESSMENT:    1. Persistent atrial fibrillation ([place])   2. Essential hypertension   3. Chronic diastolic heart failure (HCC)    PLAN:    In order of problems listed above:  1. Has been difficult to manage.  The patient has had multiple cardioversions.  She has been maintained on long-term beta-blocker therapy.  She has been managed with antiarrhythmic drug therapy including amiodarone which she eventually failed because of breakthrough atrial fibrillation.  She was initiated on Tikosyn in January 2021 and had successful cardioversion.  She had a recurrence after missing a dose of Tikosyn.  She has now had another recurrence.  I think we are running out of options for her and likely are headed towards an approach of rate control.  However, after discussion of options, the patient would like to proceed with another cardioversion as she has marked symptomatic benefit with sinus rhythm restoration.  I think if we do this, we should increase her metoprolol to 50 mg twice daily as she may have a better chance of maintaining sinus rhythm on a higher dose of her beta-blocker.  She will make this medicine change, then will be scheduled for cardioversion.  She understands the risks, benefits, and alternatives as these have been reviewed with her today.  She confirms that she has not missed any doses of apixaban.  She will proceed.  Will arrange follow-up with Roderic Palau in the atrial fibrillation clinic. 2. Blood  pressure well controlled 3. Continue current medical program.  Attempt to restore sinus rhythm with cardioversion.  Increase beta-blocker.  Plan: Increase metoprolol to 50 mg twice daily, proceed with DC cardioversion, follow-up with Roderic Palau post cardioversion.  Shared Decision Making/Informed Consent The risks (stroke, cardiac arrhythmias rarely resulting in the need for a temporary or permanent pacemaker, skin irritation or burns and complications associated with conscious sedation including aspiration, arrhythmia, respiratory failure and death), benefits (restoration of normal sinus rhythm) and alternatives of a direct current cardioversion were explained in detail to Ms. Kogler and she agrees to proceed.          Medication Adjustments/Labs and Tests Ordered: Current medicines are reviewed at length with the patient today.  Concerns regarding medicines are outlined above.  Orders Placed This Encounter  Procedures  . EKG 12-Lead   Meds ordered this encounter  Medications  . metoprolol tartrate (LOPRESSOR) 50 MG tablet    Sig: Take 0.5 tablets (25 mg total) by mouth 2 (two) times daily.    Dispense:  90 tablet    Refill:  3    There are no Patient Instructions on file for this visit.   Signed, Sherren Mocha, MD  01/10/2021 11:14 AM    [place]

## 2021-01-10 NOTE — Patient Instructions (Addendum)
Medication Instructions:  1) INCREASE METOPROLOL to 50 mg twice daily *If you need a refill on your cardiac medications before your next appointment, please call your pharmacy*  Lab Work: TODAY! BMET, CBC If you have labs (blood work) drawn today and your tests are completely normal, you will receive your results only by: Marland Kitchen MyChart Message (if you have MyChart) OR . A paper copy in the mail If you have any lab test that is abnormal or we need to change your treatment, we will call you to review the results.   Testing/Procedures: Your physician has recommended that you have a Cardioversion (DCCV). Electrical Cardioversion uses a jolt of electricity to your heart either through paddles or wired patches attached to your chest. This is a controlled, usually prescheduled, procedure. Defibrillation is done under light anesthesia in the hospital, and you usually go home the day of the procedure. This is done to get your heart back into a normal rhythm. You are not awake for the procedure. Please see the instruction sheet given to you today.   COVID SCREENING INFORMATION (01/15/2021): You are scheduled for your drive-thru COVID screening on 3/5 between 10:30AM and 11:30AM. Pre-Procedural COVID-19 Testing Site [address] [place], [place] [address] You will need to go home after your screening and quarantine until your procedure.   CARDIOVERSION INSTRUCTIONS (01/17/2021): You are scheduled for a Cardioversion on Monday, January 17, 2021 with Dr. Debara Pickett.  Please arrive at the [facility] (Main Entrance A) at [facility]: [address] [place], [place] [address] at 11:00AM.  DIET: Nothing to eat or drink after midnight except a sip of water with medications (see medication instructions below).  Medication Instructions: 1) HOLD METFORMIN the morning of your procedure 2) You may take your other medications as directed.  3) MAKE SURE TO TALK YOUR ELIQUIS.  You must have a responsible person to  drive you home and stay in the waiting area during your procedure. Failure to do so could result in cancellation.  Bring your insurance cards.  *Special Note: Every effort is made to have your procedure done on time. Occasionally there are emergencies that occur at the hospital that may cause delays. Please be patient if a delay does occur.   Follow-Up (01/26/2021): Your appointment is scheduled on: 01/26/2021 at 11:00AM with Roderic Palau in the AFib Clinic. Please arrive 15 minutes early for check-in. The AFib Clinic is located in the Heart and Vascular Specialty Clinics at [facility]. Parking instructions/directions: [address] (off [address]). When you pull in to Entrance C, there is an underground parking garage to your right. The code to enter the garage is 1223. Take the elevators to the first floor. Follow the signs to the Heart and Vascular Specialty Clinics. You will see registration at the end of the hallway.  Phone number: [phone]

## 2021-01-11 DIAGNOSIS — Z7984 Long term (current) use of oral hypoglycemic drugs: Secondary | ICD-10-CM | POA: Diagnosis not present

## 2021-01-11 DIAGNOSIS — H353121 Nonexudative age-related macular degeneration, left eye, early dry stage: Secondary | ICD-10-CM | POA: Diagnosis not present

## 2021-01-11 DIAGNOSIS — H31011 Macula scars of posterior pole (postinflammatory) (post-traumatic), right eye: Secondary | ICD-10-CM | POA: Diagnosis not present

## 2021-01-11 DIAGNOSIS — E119 Type 2 diabetes mellitus without complications: Secondary | ICD-10-CM | POA: Diagnosis not present

## 2021-01-11 DIAGNOSIS — Z961 Presence of intraocular lens: Secondary | ICD-10-CM | POA: Diagnosis not present

## 2021-01-13 ENCOUNTER — Telehealth (HOSPITAL_COMMUNITY): Payer: Self-pay | Admitting: *Deleted

## 2021-01-13 MED ORDER — METOPROLOL TARTRATE 50 MG PO TABS
50.0000 mg | ORAL_TABLET | Freq: Two times a day (BID) | ORAL | 3 refills | Status: DC
Start: 2021-01-13 — End: 2021-03-21

## 2021-01-13 NOTE — Telephone Encounter (Signed)
Patient called this morning stating she has been checking her vitals all morning and she is back in rhythm.  She is so excited to have converted on her this time.  HRs 56-58 BP 120-127/67-73 feels great. Discussed with Adline Peals PA since pt knows when she is in normal rhythm can cancel cardioversion and continue on current dose of metoprolol 50mg  BID since tolerating well. If she should develop dizziness, extreme fatigue from higher dose of metoprolol she will let our office know. Pt will call if issues arise otherwise will plan to see in May per recall. Pt in agreement and very appreciative of all the care received.

## 2021-01-13 NOTE — Telephone Encounter (Signed)
That's great thanks [employer]

## 2021-01-15 ENCOUNTER — Other Ambulatory Visit (HOSPITAL_COMMUNITY): Payer: Medicare PPO

## 2021-01-17 ENCOUNTER — Ambulatory Visit (HOSPITAL_COMMUNITY): Admission: RE | Admit: 2021-01-17 | Payer: Medicare PPO | Source: Home / Self Care | Admitting: Internal Medicine

## 2021-01-17 ENCOUNTER — Encounter (HOSPITAL_COMMUNITY): Admission: RE | Payer: Self-pay | Source: Home / Self Care

## 2021-01-17 SURGERY — CARDIOVERSION
Anesthesia: General

## 2021-01-26 ENCOUNTER — Ambulatory Visit (HOSPITAL_COMMUNITY): Payer: Medicare PPO | Admitting: Nurse Practitioner

## 2021-02-02 DIAGNOSIS — I482 Chronic atrial fibrillation, unspecified: Secondary | ICD-10-CM | POA: Diagnosis not present

## 2021-02-02 DIAGNOSIS — I10 Essential (primary) hypertension: Secondary | ICD-10-CM | POA: Diagnosis not present

## 2021-02-02 DIAGNOSIS — K219 Gastro-esophageal reflux disease without esophagitis: Secondary | ICD-10-CM | POA: Diagnosis not present

## 2021-02-02 DIAGNOSIS — K644 Residual hemorrhoidal skin tags: Secondary | ICD-10-CM | POA: Diagnosis not present

## 2021-02-02 DIAGNOSIS — Z23 Encounter for immunization: Secondary | ICD-10-CM | POA: Diagnosis not present

## 2021-02-02 DIAGNOSIS — E1142 Type 2 diabetes mellitus with diabetic polyneuropathy: Secondary | ICD-10-CM | POA: Diagnosis not present

## 2021-02-19 ENCOUNTER — Other Ambulatory Visit: Payer: Self-pay | Admitting: Physician Assistant

## 2021-03-21 ENCOUNTER — Other Ambulatory Visit (HOSPITAL_COMMUNITY): Payer: Self-pay | Admitting: Nurse Practitioner

## 2021-04-06 ENCOUNTER — Other Ambulatory Visit: Payer: Self-pay | Admitting: Cardiovascular Disease

## 2021-04-13 DIAGNOSIS — R300 Dysuria: Secondary | ICD-10-CM | POA: Diagnosis not present

## 2021-04-13 DIAGNOSIS — R197 Diarrhea, unspecified: Secondary | ICD-10-CM | POA: Diagnosis not present

## 2021-06-09 DIAGNOSIS — I480 Paroxysmal atrial fibrillation: Secondary | ICD-10-CM | POA: Diagnosis not present

## 2021-06-09 DIAGNOSIS — E1142 Type 2 diabetes mellitus with diabetic polyneuropathy: Secondary | ICD-10-CM | POA: Diagnosis not present

## 2021-06-09 DIAGNOSIS — I10 Essential (primary) hypertension: Secondary | ICD-10-CM | POA: Diagnosis not present

## 2021-06-09 DIAGNOSIS — K219 Gastro-esophageal reflux disease without esophagitis: Secondary | ICD-10-CM | POA: Diagnosis not present

## 2021-06-10 ENCOUNTER — Telehealth: Payer: Self-pay | Admitting: Internal Medicine

## 2021-06-10 NOTE — Telephone Encounter (Signed)
Reached out to [place] double book Monday 2p. Advise patient about continue SOB, chest pain to go to ED.

## 2021-06-10 NOTE — Telephone Encounter (Signed)
Pt called to report that she saw her Pcp Dr. Laurann Montana yesterday and he advised her that her HR was too fast... she says that she has been SOB for several weeks and very tired. He advised her that she needs to address with cardio.. I was unable to get the pt in with the Afib clinic early next week... I will forward the message to Dr. Rayann Heman, and the Afib clinic fro recommendations on when to sched the pt for an appt and any med changes she can make until then for some relief.   She is consistently taking her Eliquis and her Metoprolol 50 mg BID... pt advised that she may need to consider going to the ED if her symptoms worsen but she says that she has been feeling this way for several weeks and felt no urgency but worried she would need to be seen soon if possible.

## 2021-06-10 NOTE — Telephone Encounter (Signed)
Patient c/o Palpitations:  High priority if patient c/o lightheadedness, shortness of breath, or chest pain  How long have you had palpitations/irregular HR/ Afib? Are you having the symptoms now? 6-8 months, yes  Are you currently experiencing lightheadedness, SOB or CP? no  Do you have a history of afib (atrial fibrillation) or irregular heart rhythm? yes  Have you checked your BP or HR? (document readings if available): no  Are you experiencing any other symptoms? No    Patient states she saw her PCP Dr. Laurann Montana yesterday and he told her her heart was racing. She says he told her he would contact her cardiologist and have them call her to be scheduled for an appointment. She is not sure which provider she would need to see and has not heard anything. She states she was not told what her HR was at the time of the appointment, but she is in afib.

## 2021-06-13 ENCOUNTER — Other Ambulatory Visit: Payer: Self-pay

## 2021-06-13 ENCOUNTER — Ambulatory Visit (HOSPITAL_COMMUNITY)
Admission: RE | Admit: 2021-06-13 | Discharge: 2021-06-13 | Disposition: A | Discharge status: Home / Self Care | Payer: Medicare PPO | Source: Ambulatory Visit | Attending: Physician Assistant | Admitting: Physician Assistant

## 2021-06-13 VITALS — BP 120/78 | HR 104 | Ht 66.5 in | Wt 169.2 lb

## 2021-06-13 DIAGNOSIS — I10 Essential (primary) hypertension: Secondary | ICD-10-CM | POA: Diagnosis not present

## 2021-06-13 DIAGNOSIS — I484 Atypical atrial flutter: Secondary | ICD-10-CM | POA: Insufficient documentation

## 2021-06-13 DIAGNOSIS — Z7901 Long term (current) use of anticoagulants: Secondary | ICD-10-CM | POA: Diagnosis not present

## 2021-06-13 DIAGNOSIS — I4819 Other persistent atrial fibrillation: Secondary | ICD-10-CM | POA: Insufficient documentation

## 2021-06-13 DIAGNOSIS — D6869 Other thrombophilia: Secondary | ICD-10-CM | POA: Diagnosis not present

## 2021-06-13 DIAGNOSIS — I4892 Unspecified atrial flutter: Secondary | ICD-10-CM | POA: Diagnosis not present

## 2021-06-13 MED ORDER — METOPROLOL TARTRATE 50 MG PO TABS: 75.0000 mg | ORAL_TABLET | Freq: Two times a day (BID) | ORAL | 3 refills | Status: DC

## 2021-06-13 NOTE — Progress Notes (Signed)
Primary Care Physician: Lavone Orn, MD Cardiologist: [name] Pulmonologist: Dr. Elsworth Soho GI: Dr. Dolphus [name] is a 87 y.o. female with a h/o afib that has been on amiodarone since June of 2015. She has had many breakthrough events, last one in February with successful cardioversion. She does feel very weak in afib. She went into afib around 2 weeks ago and a RN came out to her house on Friday and documented the afib.  Dr. Rayann Heman was called and left instructions to increase amio to 200 mg bid and stop losartan. Apparently her BP was low on that day.  She is in now in the afib clinc and she is rate controlled and BP is stable. She reports around 2-3 weeks ago she first had 2 teeth pulled and then a shot of cortisone in her knee. She hasn't felt well since. She usually requires a cardioversion to restore SR.    F/u in afib clinic 7/15 after successful cardioversion. She is in SR today and feels improved. She is back to Amio 200 mg daily.  F/u in afib clinic, 11/25/19, with feelings of not being well and being out of rhythm x one week. She has had an UTI and just finished a round of doxycycline. EKG shows atrial flutter  variable rate at 110 bpm.CHA2DS2-VASc Score = 6, continues eliquis 5 mg bid. 1   F/u in  afib clinic, 12/16/18. She had a successful cardioversion but unfortunately felt like it only lasted one day. She is in afib in the 90's. I will try to increase BB and keep amio  at 200 mg a little longer to see if she may return to [place].   F/u  in afib clinic, 01/15/20.  F/u cardioversion 2/17. She was kept on her higher dose of BB at time of cardioversion as she did covert out but with PAC's. She saw [name] one week out and was staying in [place]. She  was left on amiodarone 200 mg bid until f/u here. She called the office earlier in the week and c/o of feeling weak. EKG shows  Sinus brady at 48 bpm. I feel her bradycardia is the reason for her weakness and will titrate meds down today.    F/u 01/22/20. She remains  in SR  in the low 60's but does not feel that much better.  Back on amiodarone 200 mg daily from  BID and lower dose of BB. Marland Kitchen She had some GI distress yesterday and recently had issues with UTI. She  denies urgency or frequency today.  F/u in afib clinic, 4/1, as pt has gone back into afib. Discussed with her I feel that amiodarone is no longer effective and I think it is best to stop drug at this point and rate control. If qt shortens off 3 month amiodarone wash out, currently at 512 ms then tikosyn may be an option down the line. She has multiple cardioversions with significant ERAF.  F/u in afib clinic, 4/14. She is feeling better off amiodarone and she is rate controlled in the 80's but would like to see her a little slower so will increase BB today. We can consider Tikosyn after  3 month amiodarone wash out if qt interval shortens.    She actually is feeling better and had the enegy to do more around her house and some light exercise. She is upset today as she had some diarrhea this am and had an accident in the clinic.i feel this  is why her BP is higher today as she usually  does not have high BP. She feels it may the the plate of food her neighbor gave her last night. She is rate controlled at home in the 70's and 80's.   F/u in afib clinic, 6/29. She remains in rate controlled afib and states that she feels very good. She is exercising without any significant shortness of breath and is rate controlled. No excessive fluid retention. I feel that amiodarone may have been contributing to fatigue and she feels better off drug. She remains on eliquis 5 mg bid for a CHA2DS2VASc score of 6.  F/u in afib clinic, 08/04/20. She  remains in rate controlled afib and initially tells me that she is exercising and feels great, but then tells me she  feels bad and would love to get back in rhythm. QT corrected around 430 ms. She has now been off amiodarone x 5 months.   F/u in afib  clinic, 10/19/20,  as pt wants to be admitted for Tikosyn initiation. She  states that she feels terrible in afib. She has been living afib for several months . I cut back her BB yesterday as she is slow in SR when she returns to SR and she is  not quite as well rate controlled today. She has been on 50 mg metoprolol bid with nice rate control.   She failed amiodarone after taking for years  and it was stopped late Spring after multiple cardioversion that would only hold short term.  She  has been off drug for sufficient period of time to try Tikosyn Her qtc today is 480 ms, but in SR with amiodarone on board was 460 ms, EKg  in spring 2015 without amio was 439 ms.  No benadryl use.  No missed anticoagulation. Electrolytes ok for admission with crcl cal at 64 so she will qualify for 500 msc. Covid negative. + vaccines.  F/u in afib clinic, 10/29/20,  one week after hospital admit for Tikosyn. Her qt prolonged and she was discharged on 125 mcg bid. She  did require cardioversion. She states today she feels improved. Her qt is 500 ms. I will need to recheck qt early next week.   F/u in afib clinic, 11/26/20, as she reported to the office last week that she went back into aifb. She states that she got stressed over her son falling and breaking his knee and a covid outbreak in the family but she was not exposed.  Ekg today shows atrial flutter with RVR with v rates in the 130's. Despite this pt feels well.  Will schedule cardioversion. She continues on Tikosyn 125 mcg bid. No missed eliquis with CHA2DS2VASc score of 6.   Follow up in the AF clinic 06/13/21. Patient reports that she went back into afib a few days after calling to cancel her DCCV in 01/2021. She states she has been in afib for months. She went to see her PCP on 7/28 and was told her heart rates were elevated. It is unclear how symptomatic she is with her afib.   Today, she denies symptoms of chest pain, shortness of breath, orthopnea, PND, lower  extremity edema, dizziness, presyncope, syncope, or neurologic sequela.Perisistent cough. The patient is tolerating medications without difficulties and is otherwise without complaint today.   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   Allergy    SEASONAL   Anemia    years ago   Anticoagulated by anticoagulation treatment - Eliquis 03/30/2014   Started May 2015    Anxiety state 10/13/2010   Qualifier: Diagnosis of  By: Nils Pyle CMA (AAMA), Leisha     Arthritis    "all my joints" (03/31/2014)   Barrett's esophagus    Breast cancer ([place])    s/p right breast lumpectomy   CARDIAC MURMUR 10/13/2010   Qualifier: Diagnosis of  By: Nils Pyle CMA (AAMA), Leisha     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   Cervical cancer ([place])    Chronic cough 01/02/2017   GERD, hiatal hernia Doubt amio   COLONIC POLYPS, ADENOMATOUS, HX OF 10/13/2010   Qualifier: Diagnosis of  By: Nils Pyle CMA (AAMA), Leisha     Degenerative disorder of eye 07/25/2012   Diabetes mellitus without complication ([place])    Dilated cardiomyopathy ([place]) 08/27/2018   DIVERTICULOSIS, COLON 10/13/2010   Qualifier: Diagnosis of  By: Nils Pyle CMA (AAMA), Leisha     DJD (degenerative joint disease) of knee    Dysrhythmia    Afib   Echocardiogram 07/2020    Echo 9/21: EF 70-75, moderate LVH, GR 1 DD, RVSP 40.5 (mildly elevated), normal RVSF, moderate LAE, mild RAE, mild MR, trivial AI   EROSIVE ESOPHAGITIS 10/13/2010   Qualifier: Diagnosis of  By: Nils Pyle CMA (AAMA), Leisha     Error, refractive, myopia 07/09/2012   Essential hypertension 10/13/2010   Qualifier: Diagnosis of  By: Nils Pyle CMA (AAMA), Rueben Bash, intermittent 07/09/2012   GERD 10/14/2010   Qualifier: Diagnosis of  By: Sharlett Iles MD Cline Cools R    GERD (gastroesophageal reflux disease)    Glaucoma    H/O hiatal  hernia    Heart murmur    Hiatal hernia s/p robotic repair & fundoplication 0/45/9977 41/02/2394   Qualifier: Diagnosis of  By: Nils Pyle CMA (AAMA), Mearl Latin     History of laser assisted in situ keratomileusis 07/25/2012   Hx of transesophageal echocardiography (TEE) for monitoring    TEE (03/2014): No LAA clot, moderate LAE, core triatriatum type structure in RA with no stenosis, normal EF 55-60%, mild MR   HYPERCHOLESTEROLEMIA 10/13/2010   Qualifier: Diagnosis of  By: Nils Pyle CMA (AAMA), Leisha     Hypertension    Hypothyroidism, postsurgical    Iron Deficiency Anemia 10/14/2010   Qualifier: Diagnosis of  By: Sharlett Iles MD Cline Cools R    Irritable bowel syndrome 10/13/2010   Qualifier: Diagnosis of  By: Nils Pyle CMA (AAMA), Leisha     Lumbar stenosis with neurogenic claudication 04/20/2016   Malignant neoplasm of breast ([place]) 02/25/2016   Migraine    "last one was years ago" (03/31/2014)   MIGRAINE HEADACHE 10/13/2010   Qualifier: Diagnosis of  By: Nils Pyle CMA (AAMA), Leisha     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   Overview:  X(T)    OSA on CPAP    settings at 10-11    OSTEOARTHRITIS 10/13/2010   Qualifier: Diagnosis of  By: Nils Pyle CMA (AAMA), Leisha     Persistent atrial fibrillation 03/31/2015   DLCO dropped from 100% in 2015-50% in 01/2017   Post-surgical hypothyroidism 04/09/2014   RECTAL FISSURE 10/13/2010   Qualifier: Diagnosis of  By: Nils Pyle CMA (AAMA), Mearl Latin     S/P Nissen fundoplication (without gastrostomy tube) procedure 05/25/2017   Sleep apnea    THYROID CANCER 10/13/2010   Qualifier: Diagnosis of  By: Nils Pyle CMA (  AAMA), Mearl Latin     Thyroid cancer ([place])    s/p thyroidectomy   Type II diabetes mellitus ([place])    type II    Past Surgical History:  Procedure Laterality Date   ABDOMINAL HYSTERECTOMY     "partial"   BREAST BIOPSY Right    BREAST LUMPECTOMY Right    CARDIOVERSION N/A 03/30/2014   Procedure: CARDIOVERSION - BEDSIDE;  Surgeon: [name], MD;  Location: [facility];  Service:  Cardiovascular;  Laterality: N/A;   CARDIOVERSION N/A 03/31/2014   Procedure: CARDIOVERSION  (BEDSIDE) ;  Surgeon: Lelon Perla, MD;  Location: [facility];  Service: Cardiovascular;  Laterality: N/A;   CARDIOVERSION N/A 04/27/2014   Procedure: CARDIOVERSION;  Surgeon: Darlin Coco, MD;  Location: [facility];  Service: Cardiovascular;  Laterality: N/A;   CARDIOVERSION N/A 04/13/2015   Procedure: CARDIOVERSION;  Surgeon: Thayer Headings, MD;  Location: [place] [facility];  Service: Cardiovascular;  Laterality: N/A;   CARDIOVERSION N/A 01/08/2017   Procedure: CARDIOVERSION;  Surgeon: Sanda Klein, MD;  Location: [facility] ENDOSCOPY;  Service: Cardiovascular;  Laterality: N/A;   CARDIOVERSION N/A 09/19/2017   Procedure: CARDIOVERSION;  Surgeon: [name], MD;  Location: [place];  Service: Cardiovascular;  Laterality: N/A;   CARDIOVERSION N/A 10/25/2017   Procedure: CARDIOVERSION;  Surgeon: Nahser, Wonda Cheng, MD;  Location: [place];  Service: Cardiovascular;  Laterality: N/A;   CARDIOVERSION N/A 12/27/2018   Procedure: CARDIOVERSION;  Surgeon: [name], MD;  Location: [place];  Service: Cardiovascular;  Laterality: N/A;   CARDIOVERSION N/A 05/20/2019   Procedure: CARDIOVERSION;  Surgeon: [name], MD;  Location: [place];  Service: Cardiovascular;  Laterality: N/A;   CARDIOVERSION N/A 12/10/2019   Procedure: CARDIOVERSION;  Surgeon: Pixie Casino, MD;  Location: [place];  Service: Cardiovascular;  Laterality: N/A;   CARDIOVERSION N/A 12/31/2019   Procedure: CARDIOVERSION;  Surgeon: Sanda Klein, MD;  Location: [place];  Service: Cardiovascular;  Laterality: N/A;   CARDIOVERSION N/A 10/21/2020   Procedure: CARDIOVERSION;  Surgeon: [name], MD;  Location: [place];  Service: Cardiovascular;  Laterality: N/A;   CARDIOVERSION N/A 12/03/2020   Procedure: CARDIOVERSION;  Surgeon: Jerline Pain, MD;  Location: [place] [facility]OSCOPY;  Service:  Cardiovascular;  Laterality: N/A;   CARPAL TUNNEL RELEASE Right    CATARACT EXTRACTION W/ INTRAOCULAR LENS  IMPLANT, BILATERAL Bilateral    COLONOSCOPY     EXCISIONAL HEMORRHOIDECTOMY     INSERTION OF MESH N/A 05/25/2017   Procedure: INSERTION OF MESH;  Surgeon: Ralene Ok, MD;  Location: [facility];  Service: General;  Laterality: N/A;   TEE WITHOUT CARDIOVERSION N/A 03/30/2014   Procedure: TRANSESOPHAGEAL ECHOCARDIOGRAM (TEE);  Surgeon: [name], MD;  [facility]COPY;  Service: Cardiovascular;  Laterality: N/A;   THYROIDECTOMY     TONSILLECTOMY      Current Outpatient Medications  Medication Sig Dispense Refill   ACCU-CHEK GUIDE test strip      acetaminophen (TYLENOL) 650 MG CR tablet Take 650 mg by mouth every 8 (eight) hours.     Blood Glucose Monitoring Suppl (ACCU-CHEK GUIDE ME) w/Device KIT      Calcium Citrate (CITRACAL PO) Take 800 mg by mouth daily.      cetirizine (ZYRTEC) 10 MG tablet Take 10 mg by mouth daily.     cholecalciferol (VITAMIN D) 25 MCG (1000 UNIT) tablet Take 1,000 Units by mouth daily.     diclofenac Sodium (VOLTAREN) 1 % GEL Apply 2 g topically daily as needed (Knee  pain).     dofetilide (TIKOSYN) 125 MCG capsule Take 1 capsule (125 mcg total) by mouth 2 (two) times daily. 60 capsule 11   ELIQUIS 5 MG TABS tablet TAKE 1 TABLET TWICE DAILY (Patient taking differently: Take 5 mg by mouth 2 (two) times daily.) 180 tablet 3   ferrous sulfate 325 (65 FE) MG tablet Take 325 mg by mouth daily with breakfast.     fluticasone (FLONASE) 50 MCG/ACT nasal spray Place 1 spray into both nostrils at bedtime as needed for allergies.      lansoprazole (PREVACID) 30 MG capsule Take 30 mg by mouth daily as needed (acid reflux).      levothyroxine (SYNTHROID) 112 MCG tablet Take 112 mcg by mouth at bedtime. Middle of night     LORazepam (ATIVAN) 1 MG tablet Take 1.5 mg by mouth at bedtime.      Lutein 6 MG CAPS Take 6 mg by mouth daily.      magnesium oxide (MAG-OX)  400 MG tablet TAKE ONE TABLET BY MOUTH TWICE A DAY 60 tablet 6   metFORMIN (GLUCOPHAGE-XR) 500 MG 24 hr tablet Take 500 mg by mouth 2 (two) times daily.     Multiple Vitamins-Minerals (PRESERVISION AREDS 2) CAPS Take 1 capsule by mouth daily.     Polyvinyl Alcohol-Povidone PF 1.4-0.6 % SOLN Place 1 drop into both eyes daily as needed (dry eyes).      potassium chloride SA (KLOR-CON) 20 MEQ tablet TAKE 1 TABLET EVERY DAY (Patient taking differently: Take 20 mEq by mouth 2 (two) times daily.) 90 tablet 1   rosuvastatin (CRESTOR) 10 MG tablet Take 10 mg by mouth at bedtime.     sertraline (ZOLOFT) 50 MG tablet Take 50 mg by mouth daily.     vitamin B-12 (CYANOCOBALAMIN) 1000 MCG tablet Take 500 mcg by mouth daily.     zinc gluconate 50 MG tablet Take 50 mg by mouth daily.     metoprolol tartrate (LOPRESSOR) 50 MG tablet Take 1.5 tablets (75 mg total) by mouth 2 (two) times daily. 180 tablet 3   No current facility-administered medications for this encounter.    Allergies  Allergen Reactions   Penicillins Itching, Rash and Other (See Comments)    Did it involve swelling of the face/tongue/throat, SOB, or low BP? No Did it involve sudden or severe rash/hives, skin peeling, or any reaction on the inside of your mouth or nose? No Did you need to seek medical attention at a hospital or doctor's office? No When did it last happen?    30+ years   If all above answers are "NO", may proceed with cephalosporin use.     Social History   Socioeconomic History   Marital status: Divorced    Spouse name: Not on file   Number of children: 2   Years of education: Not on file   Highest education level: Not on file  Occupational History   Occupation: Retired    Comment: Education officer, museum  Tobacco Use   Smoking status: Never   Smokeless tobacco: Never  Vaping Use   Vaping Use: Never used  Substance and Sexual Activity   Alcohol use: No    Alcohol/week: 0.0 standard drinks   Drug use: No   Sexual  activity: Never  Other Topics Concern   Not on file  Social History Narrative   Not on file   Social Determinants of Health   Financial Resource Strain: Not on file  Food Insecurity: Not on  file  Transportation Needs: Not on file  Physical Activity: Not on file  Stress: Not on file  Social Connections: Not on file  Intimate Partner Violence: Not on file    Family History  Problem Relation Age of Onset   Prostate cancer Father    Diabetes Sister    Heart disease Sister    Heart attack Brother    Heart disease Brother    Diabetes Brother    Diabetes Sister    Diabetes Sister    Pancreatic cancer Sister    Diabetes Brother    Heart disease Brother    Diabetes Brother    Heart disease Brother    Diabetes Brother    Heart disease Brother    Diabetes Brother    Heart disease Brother    Diabetes Brother    Heart disease Brother    Diabetes Brother    Diabetes Child     ROS- All systems are reviewed and negative except as per the HPI above  Physical Exam: Vitals:   06/13/21 1337  BP: 120/78  Pulse: (!) 104  Weight: 76.7 kg  Height: 5' 6.5" (1.689 m)   Wt Readings from Last 3 Encounters:  06/13/21 76.7 kg  01/10/21 79.9 kg  12/23/20 78 kg    Labs: Lab Results  Component Value Date   NA 141 01/10/2021   K 4.9 01/10/2021   CL 105 01/10/2021   CO2 26 01/10/2021   GLUCOSE 107 (H) 01/10/2021   BUN 17 01/10/2021   CREATININE 0.74 01/10/2021   CALCIUM 9.5 01/10/2021   MG 2.1 12/23/2020   Lab Results  Component Value Date   INR 1.2 10/21/2020   No results found for: CHOL, HDL, LDLCALC, TRIG   GEN- The patient is a well appearing elderly female, alert and oriented x 3 today.   HEENT-head normocephalic, atraumatic, sclera clear, conjunctiva pink, hearing intact, trachea midline. Lungs- Clear to ausculation bilaterally, normal work of breathing Heart- irregular rate and rhythm, no murmurs, rubs or gallops  GI- soft, NT, ND, + BS Extremities- no clubbing,  cyanosis, or edema MS- no significant deformity or atrophy Skin- no rash or lesion Psych- euthymic mood, full affect Neuro- strength and sensation are intact   EKG- atypical atrial flutter with variable block Vent. rate 104 BPM PR interval * ms QRS duration 86 ms QT/QTcB 344/452 ms  Epic records reviewed   Assessment and Plan: 1. Persistent atrial fibrillation/atrial flutter Recall previous failure of amiodarone.  Loaded on dofetilide 10/2020 Patient in atypical atrial flutter. It is unclear how symptomatic she is. Patient is more concerned about her elevated rates.  Will increase metoprolol to 75 mg BID for better rate control.  We discussed DCCV vs rate control. Patient would like to see how she does on higher dose of BB first. Continue Eliquis 5 mg BID Continue dofetilide 125 mcg BID for now. If rate control is pursued, would d/c.  2. HTN Stable, no changes today.  3. CHA2DS2VASc score of 6 Eliquis 5 mg BID    Follow up in the AF clinic in 2 weeks.    [facility] [address] [name],  [address] [phone]

## 2021-06-13 NOTE — Patient Instructions (Signed)
Increase Metoprolol '75mg'$  twice daily

## 2021-06-14 MED ORDER — POTASSIUM CHLORIDE CRYS ER 20 MEQ PO TBCR: 20.0000 meq | EXTENDED_RELEASE_TABLET | Freq: Two times a day (BID) | ORAL | Status: DC

## 2021-06-14 NOTE — Addendum Note (Signed)
Encounter addended by: Enid Derry, CMA on: 06/14/2021 9:34 AM  Actions taken: Order Reconciliation Section accessed, Order list changed, Pharmacy for encounter modified

## 2021-06-30 ENCOUNTER — Ambulatory Visit (HOSPITAL_COMMUNITY)
Admission: RE | Admit: 2021-06-30 | Discharge: 2021-06-30 | Disposition: A | Discharge status: Home / Self Care | Payer: Medicare PPO | Source: Ambulatory Visit | Attending: Nurse Practitioner | Admitting: Nurse Practitioner

## 2021-06-30 ENCOUNTER — Encounter (HOSPITAL_COMMUNITY): Payer: Self-pay | Admitting: Nurse Practitioner

## 2021-06-30 ENCOUNTER — Other Ambulatory Visit: Payer: Self-pay

## 2021-06-30 VITALS — BP 132/92 | HR 111 | Ht 66.5 in | Wt 172.4 lb

## 2021-06-30 DIAGNOSIS — D6869 Other thrombophilia: Secondary | ICD-10-CM | POA: Diagnosis not present

## 2021-06-30 DIAGNOSIS — I4819 Other persistent atrial fibrillation: Secondary | ICD-10-CM | POA: Diagnosis not present

## 2021-06-30 DIAGNOSIS — I10 Essential (primary) hypertension: Secondary | ICD-10-CM | POA: Diagnosis not present

## 2021-06-30 DIAGNOSIS — Z7901 Long term (current) use of anticoagulants: Secondary | ICD-10-CM | POA: Insufficient documentation

## 2021-06-30 DIAGNOSIS — Z79899 Other long term (current) drug therapy: Secondary | ICD-10-CM | POA: Insufficient documentation

## 2021-06-30 DIAGNOSIS — I4892 Unspecified atrial flutter: Secondary | ICD-10-CM | POA: Insufficient documentation

## 2021-06-30 DIAGNOSIS — I484 Atypical atrial flutter: Secondary | ICD-10-CM

## 2021-06-30 DIAGNOSIS — Z8249 Family history of ischemic heart disease and other diseases of the circulatory system: Secondary | ICD-10-CM | POA: Diagnosis not present

## 2021-06-30 DIAGNOSIS — Z880 Allergy status to penicillin: Secondary | ICD-10-CM | POA: Diagnosis not present

## 2021-06-30 MED ORDER — METOPROLOL TARTRATE 50 MG PO TABS: ORAL_TABLET | ORAL | 3 refills | Status: DC

## 2021-06-30 NOTE — Patient Instructions (Signed)
Increase metoprolol medication - Taking 2 tablets in the am and take two tablets in the evening- to equal '200mg'$  total

## 2021-06-30 NOTE — Progress Notes (Signed)
Primary Care Physician: Lavone Orn, MD Cardiologist: [name] Pulmonologist: Dr. Elsworth Soho GI: Dr. Dolphus [name] is a 87 y.o. female with a h/o afib that has been on amiodarone since June of 2015. She has had many breakthrough events, last one in February with successful cardioversion. She does feel very weak in afib. She went into afib around 2 weeks ago and a RN came out to her house on Friday and documented the afib.  Dr. Rayann Heman was called and left instructions to increase amio to 200 mg bid and stop losartan. Apparently her BP was low on that day.  She is in now in the afib clinc and she is rate controlled and BP is stable. She reports around 2-3 weeks ago she first had 2 teeth pulled and then a shot of cortisone in her knee. She hasn't felt well since. She usually requires a cardioversion to restore SR.    F/u in afib clinic 7/15 after successful cardioversion. She is in SR today and feels improved. She is back to Amio 200 mg daily.  F/u in afib clinic, 11/25/19, with feelings of not being well and being out of rhythm x one week. She has had an UTI and just finished a round of doxycycline. EKG shows atrial flutter  variable rate at 110 bpm.CHA2DS2-VASc Score = 6, continues eliquis 5 mg bid. 1   F/u in  afib clinic, 12/16/18. She had a successful cardioversion but unfortunately felt like it only lasted one day. She is in afib in the 90's. I will try to increase BB and keep amio  at 200 mg a little longer to see if she may return to [place].   F/u  in afib clinic, 01/15/20.  F/u cardioversion 2/17. She was kept on her higher dose of BB at time of cardioversion as she did covert out but with PAC's. She saw [name] one week out and was staying in [place]. She  was left on amiodarone 200 mg bid until f/u here. She called the office earlier in the week and c/o of feeling weak. EKG shows  Sinus brady at 48 bpm. I feel her bradycardia is the reason for her weakness and will titrate meds down today.    F/u 01/22/20. She remains  in SR  in the low 60's but does not feel that much better.  Back on amiodarone 200 mg daily from  BID and lower dose of BB. Marland Kitchen She had some GI distress yesterday and recently had issues with UTI. She  denies urgency or frequency today.  F/u in afib clinic, 4/1, as pt has gone back into afib. Discussed with her I feel that amiodarone is no longer effective and I think it is best to stop drug at this point and rate control. If qt shortens off 3 month amiodarone wash out, currently at 512 ms then tikosyn may be an option down the line. She has multiple cardioversions with significant ERAF.  F/u in afib clinic, 4/14. She is feeling better off amiodarone and she is rate controlled in the 80's but would like to see her a little slower so will increase BB today. We can consider Tikosyn after  3 month amiodarone wash out if qt interval shortens.    She actually is feeling better and had the enegy to do more around her house and some light exercise. She is upset today as she had some diarrhea this am and had an accident in the clinic.i feel this  is why her BP is higher today as she usually  does not have high BP. She feels it may the the plate of food her neighbor gave her last night. She is rate controlled at home in the 70's and 80's.   F/u in afib clinic, 6/29. She remains in rate controlled afib and states that she feels very good. She is exercising without any significant shortness of breath and is rate controlled. No excessive fluid retention. I feel that amiodarone may have been contributing to fatigue and she feels better off drug. She remains on eliquis 5 mg bid for a CHA2DS2VASc score of 6.  F/u in afib clinic, 08/04/20. She  remains in rate controlled afib and initially tells me that she is exercising and feels great, but then tells me she  feels bad and would love to get back in rhythm. QT corrected around 430 ms. She has now been off amiodarone x 5 months.   F/u in afib  clinic, 10/19/20,  as pt wants to be admitted for Tikosyn initiation. She  states that she feels terrible in afib. She has been living afib for several months . I cut back her BB yesterday as she is slow in SR when she returns to SR and she is  not quite as well rate controlled today. She has been on 50 mg metoprolol bid with nice rate control.   She failed amiodarone after taking for years  and it was stopped late Spring after multiple cardioversion that would only hold short term.  She  has been off drug for sufficient period of time to try Tikosyn Her qtc today is 480 ms, but in SR with amiodarone on board was 460 ms, EKg  in spring 2015 without amio was 439 ms.  No benadryl use.  No missed anticoagulation. Electrolytes ok for admission with crcl cal at 64 so she will qualify for 500 msc. Covid negative. + vaccines.  F/u in afib clinic, 10/29/20,  one week after hospital admit for Tikosyn. Her qt prolonged and she was discharged on 125 mcg bid. She  did require cardioversion. She states today she feels improved. Her qt is 500 ms. I will need to recheck qt early next week.   F/u in afib clinic, 11/26/20, as she reported to the office last week that she went back into aifb. She states that she got stressed over her son falling and breaking his knee and a covid outbreak in the family but she was not exposed.  Ekg today shows atrial flutter with RVR with v rates in the 130's. Despite this pt feels well.  Will schedule cardioversion. She continues on Tikosyn 125 mcg bid. No missed eliquis with CHA2DS2VASc score of 6.   Follow up in the AF clinic 06/13/21. Patient reports that she went back into afib a few days after calling to cancel her DCCV in 01/2021. She states she has been in afib for months. She went to see her PCP on 7/28 and was told her heart rates were elevated. It is unclear how symptomatic she is with her afib.   F?u in afib clinic. She remains in afib with RVR. We discussed cardioversion vrs further  adjustment of her meds. She would prefer to increase BB again and if the does not improve things then pursue CV.   Today, she denies symptoms of chest pain, shortness of breath, orthopnea, PND, lower extremity edema, dizziness, presyncope, syncope, or neurologic sequela.Perisistent cough. The patient is tolerating medications without difficulties  and is otherwise without complaint today.   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   Allergy    SEASONAL   Anemia    years ago   Anticoagulated by anticoagulation treatment - Eliquis 03/30/2014   Started May 2015    Anxiety state 10/13/2010   Qualifier: Diagnosis of  By: Nils Pyle CMA (AAMA), Leisha     Arthritis    "all my joints" (03/31/2014)   Barrett's esophagus    Breast cancer ([place])    s/p right breast lumpectomy   CARDIAC MURMUR 10/13/2010   Qualifier: Diagnosis of  By: Nils Pyle CMA (AAMA), Leisha     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   Cervical cancer ([place])    Chronic cough 01/02/2017   GERD, hiatal hernia Doubt amio   COLONIC POLYPS, ADENOMATOUS, HX OF 10/13/2010   Qualifier: Diagnosis of  By: Nils Pyle CMA (AAMA), Leisha     Degenerative disorder of eye 07/25/2012   Diabetes mellitus without complication ([place])    Dilated cardiomyopathy ([place]) 08/27/2018   DIVERTICULOSIS, COLON 10/13/2010   Qualifier: Diagnosis of  By: Nils Pyle CMA (AAMA), Leisha     DJD (degenerative joint disease) of knee    Dysrhythmia    Afib   Echocardiogram 07/2020    Echo 9/21: EF 70-75, moderate LVH, GR 1 DD, RVSP 40.5 (mildly elevated), normal RVSF, moderate LAE, mild RAE, mild MR, trivial AI   EROSIVE ESOPHAGITIS 10/13/2010   Qualifier: Diagnosis of  By: Nils Pyle CMA (AAMA), Leisha     Error, refractive, myopia 07/09/2012   Essential hypertension 10/13/2010   Qualifier: Diagnosis of  By: Nils Pyle CMA  (AAMA), Rueben Bash, intermittent 07/09/2012   GERD 10/14/2010   Qualifier: Diagnosis of  By: Sharlett Iles MD Cline Cools R    GERD (gastroesophageal reflux disease)    Glaucoma    H/O hiatal hernia    Heart murmur    Hiatal hernia s/p robotic repair & fundoplication 4/78/2956 21/01/864   Qualifier: Diagnosis of  By: Nils Pyle CMA (AAMA), Mearl Latin     History of laser assisted in situ keratomileusis 07/25/2012   Hx of transesophageal echocardiography (TEE) for monitoring    TEE (03/2014): No LAA clot, moderate LAE, core triatriatum type structure in RA with no stenosis, normal EF 55-60%, mild MR   HYPERCHOLESTEROLEMIA 10/13/2010   Qualifier: Diagnosis of  By: Nils Pyle CMA (AAMA), Leisha     Hypertension    Hypothyroidism, postsurgical    Iron Deficiency Anemia 10/14/2010   Qualifier: Diagnosis of  By: Sharlett Iles MD Cline Cools R    Irritable bowel syndrome 10/13/2010   Qualifier: Diagnosis of  By: Nils Pyle CMA (AAMA), Leisha     Lumbar stenosis with neurogenic claudication 04/20/2016   Malignant neoplasm of breast ([place]) 02/25/2016   Migraine    "last one was years ago" (03/31/2014)   MIGRAINE HEADACHE 10/13/2010   Qualifier: Diagnosis of  By: Nils Pyle CMA (AAMA), Leisha     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   Overview:  X(T)    OSA on CPAP    settings at 10-11    OSTEOARTHRITIS 10/13/2010   Qualifier: Diagnosis of  By: Nils Pyle CMA (AAMA), Leisha     Persistent atrial fibrillation 03/31/2015   DLCO dropped from 100% in 2015-50% in 01/2017   Post-surgical hypothyroidism 04/09/2014   RECTAL FISSURE 10/13/2010   Qualifier:  Diagnosis of  By: Nils Pyle CMA (AAMA), Mearl Latin     S/P Nissen fundoplication (without gastrostomy tube) procedure 05/25/2017   Sleep apnea    THYROID CANCER 10/13/2010   Qualifier: Diagnosis of  By: Nils Pyle CMA Deborra Medina), Mearl Latin     Thyroid cancer [facility])    s/p thyroidectomy   Type II diabetes mellitus ([place])    type II    Past Surgical History:  Procedure Laterality Date   ABDOMINAL  HYSTERECTOMY     "partial"   BREAST BIOPSY Right    BREAST LUMPECTOMY Right    CARDIOVERSION N/A 03/30/2014   Procedure: CARDIOVERSION - BEDSIDE;  Surgeon: [name], MD;  Location: [place];  Service: Cardiovascular;  Laterality: N/A;   CARDIOVERSION N/A 03/31/2014   Procedure: CARDIOVERSION  (BEDSIDE) ;  Surgeon: Lelon Perla, MD;  Location: [place];  Service: Cardiovascular;  Laterality: N/A;   CARDIOVERSION N/A 04/27/2014   Procedure: CARDIOVERSION;  Surgeon: Darlin Coco, MD;  Location: [place];  Service: Cardiovascular;  Laterality: N/A;   CARDIOVERSION N/A 04/13/2015   Procedure: CARDIOVERSION;  Surgeon: Thayer Headings, MD;  Location: [place];  Service: Cardiovascular;  Laterality: N/A;   CARDIOVERSION N/A 01/08/2017   Procedure: CARDIOVERSION;  Surgeon: Sanda Klein, MD;  Location: [facility] ENDOSCOPY;  Service: Cardiovascular;  Laterality: N/A;   CARDIOVERSION N/A 09/19/2017   Procedure: CARDIOVERSION;  Surgeon: [name], MD;  Location: [place];  Service: Cardiovascular;  Laterality: N/A;   CARDIOVERSION N/A 10/25/2017   Procedure: CARDIOVERSION;  Surgeon: Nahser, Wonda Cheng, MD;  Location: [place];  Service: Cardiovascular;  Laterality: N/A;   CARDIOVERSION N/A 12/27/2018   Procedure: CARDIOVERSION;  Surgeon: [name], MD;  Location: [place];  Service: Cardiovascular;  Laterality: N/A;   CARDIOVERSION N/A 05/20/2019   Procedure: CARDIOVERSION;  Surgeon: [name], MD;  Location: [place];  Service: Cardiovascular;  Laterality: N/A;   CARDIOVERSION N/A 12/10/2019   Procedure: CARDIOVERSION;  Surgeon: Pixie Casino, MD;  Location: [place];  Service: Cardiovascular;  Laterality: N/A;   CARDIOVERSION N/A 12/31/2019   Procedure: CARDIOVERSION;  Surgeon: Sanda Klein, MD;  Location: [place];  Service: Cardiovascular;  Laterality: N/A;   CARDIOVERSION N/A 10/21/2020   Procedure: CARDIOVERSION;  Surgeon: [name], MD;   Location: [place];  Service: Cardiovascular;  Laterality: N/A;   CARDIOVERSION N/A 12/03/2020   Procedure: CARDIOVERSION;  Surgeon: Jerline Pain, MD;  Location: [place] [facility]OSCOPY;  Service: Cardiovascular;  Laterality: N/A;   CARPAL TUNNEL RELEASE Right    CATARACT EXTRACTION W/ INTRAOCULAR LENS  IMPLANT, BILATERAL Bilateral    COLONOSCOPY     EXCISIONAL HEMORRHOIDECTOMY     INSERTION OF MESH N/A 05/25/2017   Procedure: INSERTION OF MESH;  Surgeon: Ralene Ok, MD;  Location: [facility];  Service: General;  Laterality: N/A;   TEE WITHOUT CARDIOVERSION N/A 03/30/2014   Procedure: TRANSESOPHAGEAL ECHOCARDIOGRAM (TEE);  Surgeon: [name], MD;  Location: [facility] END[facility]COPY;  Service: Cardiovascular;  Laterality: N/A;   THYROIDECTOMY     TONSILLECTOMY      Current Outpatient Medications  Medication Sig Dispense Refill   ACCU-CHEK GUIDE test strip      acetaminophen (TYLENOL) 650 MG CR tablet Take 650 mg by mouth every 8 (eight) hours.     Blood Glucose Monitoring Suppl (ACCU-CHEK GUIDE ME) w/Device KIT      Calcium Citrate (CITRACAL PO) Take 800 mg by mouth daily.      cetirizine (ZYRTEC) 10 MG tablet Take 10 mg  by mouth daily.     cholecalciferol (VITAMIN D) 25 MCG (1000 UNIT) tablet Take 1,000 Units by mouth daily.     diclofenac Sodium (VOLTAREN) 1 % GEL Apply 2 g topically daily as needed (Knee pain).     dofetilide (TIKOSYN) 125 MCG capsule Take 1 capsule (125 mcg total) by mouth 2 (two) times daily. 60 capsule 11   ELIQUIS 5 MG TABS tablet TAKE 1 TABLET TWICE DAILY 180 tablet 3   ferrous sulfate 325 (65 FE) MG tablet Take 325 mg by mouth daily with breakfast.     fluticasone (FLONASE) 50 MCG/ACT nasal spray Place 1 spray into both nostrils at bedtime as needed for allergies.      lansoprazole (PREVACID) 30 MG capsule Take 30 mg by mouth daily as needed (acid reflux).      levothyroxine (SYNTHROID) 112 MCG tablet Take 112 mcg by mouth at bedtime. Middle of night     LORazepam (ATIVAN)  1 MG tablet Take 1.5 mg by mouth at bedtime.      Lutein 6 MG CAPS Take 6 mg by mouth daily.      magnesium oxide (MAG-OX) 400 MG tablet TAKE ONE TABLET BY MOUTH TWICE A DAY 60 tablet 6   metFORMIN (GLUCOPHAGE-XR) 500 MG 24 hr tablet Take 500 mg by mouth 2 (two) times daily.     Multiple Vitamins-Minerals (PRESERVISION AREDS 2) CAPS Take 1 capsule by mouth daily.     Polyvinyl Alcohol-Povidone PF 1.4-0.6 % SOLN Place 1 drop into both eyes daily as needed (dry eyes).      potassium chloride SA (KLOR-CON) 20 MEQ tablet Take 1 tablet (20 mEq total) by mouth 2 (two) times daily.     rosuvastatin (CRESTOR) 10 MG tablet Take 10 mg by mouth at bedtime.     sertraline (ZOLOFT) 50 MG tablet Take 50 mg by mouth daily.     vitamin B-12 (CYANOCOBALAMIN) 1000 MCG tablet Take 500 mcg by mouth daily.     zinc gluconate 50 MG tablet Take 50 mg by mouth daily.     metoprolol tartrate (LOPRESSOR) 50 MG tablet Take 2 tablets by mouth in the am and take two tablets in the evening 180 tablet 3   No current facility-administered medications for this encounter.    Allergies  Allergen Reactions   Penicillins Itching, Rash and Other (See Comments)    Did it involve swelling of the face/tongue/throat, SOB, or low BP? No Did it involve sudden or severe rash/hives, skin peeling, or any reaction on the inside of your mouth or nose? No Did you need to seek medical attention at a hospital or doctor's office? No When did it last happen?    30+ years   If all above answers are "NO", may proceed with cephalosporin use.     Social History   Socioeconomic History   Marital status: Divorced    Spouse name: Not on file   Number of children: 2   Years of education: Not on file   Highest education level: Not on file  Occupational History   Occupation: Retired    Comment: Education officer, museum  Tobacco Use   Smoking status: Never   Smokeless tobacco: Never  Vaping Use   Vaping Use: Never used  Substance and Sexual  Activity   Alcohol use: No    Alcohol/week: 0.0 standard drinks   Drug use: No   Sexual activity: Never  Other Topics Concern   Not on file  Social History Narrative  Not on file   Social Determinants of Health   Financial Resource Strain: Not on file  Food Insecurity: Not on file  Transportation Needs: Not on file  Physical Activity: Not on file  Stress: Not on file  Social Connections: Not on file  Intimate Partner Violence: Not on file    Family History  Problem Relation Age of Onset   Prostate cancer Father    Diabetes Sister    Heart disease Sister    Heart attack Brother    Heart disease Brother    Diabetes Brother    Diabetes Sister    Diabetes Sister    Pancreatic cancer Sister    Diabetes Brother    Heart disease Brother    Diabetes Brother    Heart disease Brother    Diabetes Brother    Heart disease Brother    Diabetes Brother    Heart disease Brother    Diabetes Brother    Heart disease Brother    Diabetes Brother    Diabetes Child     ROS- All systems are reviewed and negative except as per the HPI above  Physical Exam: Vitals:   06/30/21 1329  BP: (!) 132/92  Pulse: (!) 111  Weight: 78.2 kg  Height: 5' 6.5" (1.689 m)   Wt Readings from Last 3 Encounters:  06/30/21 78.2 kg  06/13/21 76.7 kg  01/10/21 79.9 kg    Labs: Lab Results  Component Value Date   NA 141 01/10/2021   K 4.9 01/10/2021   CL 105 01/10/2021   CO2 26 01/10/2021   GLUCOSE 107 (H) 01/10/2021   BUN 17 01/10/2021   CREATININE 0.74 01/10/2021   CALCIUM 9.5 01/10/2021   MG 2.1 12/23/2020   Lab Results  Component Value Date   INR 1.2 10/21/2020   No results found for: CHOL, HDL, LDLCALC, TRIG   GEN- The patient is a well appearing elderly female, alert and oriented x 3 today.   HEENT-head normocephalic, atraumatic, sclera clear, conjunctiva pink, hearing intact, trachea midline. Lungs- Clear to ausculation bilaterally, normal work of breathing Heart-  irregular rate and rhythm, no murmurs, rubs or gallops  GI- soft, NT, ND, + BS Extremities- no clubbing, cyanosis, or edema MS- no significant deformity or atrophy Skin- no rash or lesion Psych- euthymic mood, full affect Neuro- strength and sensation are intact   EKG- atypical atrial flutter with variable block at 11 bpm, qrs int 90 ms, qtc 503 ms   Epic records reviewed   Assessment and Plan: 1. Persistent atrial fibrillation/atrial flutter Recall previous failure of amiodarone.  Loaded on dofetilide 10/2020 Patient in atypical atrial flutter. It is unclear how symptomatic she is. Patient is more concerned about her elevated rates.  Will increase metoprolol to 100  mg BID for better rate control.  We discussed DCCV vs rate control. Patient would like to see how she does on higher dose of BB first. Continue Eliquis 5 mg BID Continue dofetilide 125 mcg BID for now.   2. HTN Stable, no changes today.  3. CHA2DS2VASc score of 6 Eliquis 5 mg BID    Follow up in the AF clinic in one week    [facility] [address] [place], [place] [address] [phone]

## 2021-07-05 ENCOUNTER — Other Ambulatory Visit (HOSPITAL_COMMUNITY): Payer: Self-pay | Admitting: Nurse Practitioner

## 2021-07-05 ENCOUNTER — Other Ambulatory Visit: Payer: Self-pay

## 2021-07-05 ENCOUNTER — Ambulatory Visit (HOSPITAL_COMMUNITY)
Admission: RE | Admit: 2021-07-05 | Discharge: 2021-07-05 | Disposition: A | Discharge status: Home / Self Care | Payer: Medicare PPO | Source: Ambulatory Visit | Attending: Nurse Practitioner | Admitting: Nurse Practitioner

## 2021-07-05 ENCOUNTER — Encounter (HOSPITAL_COMMUNITY): Payer: Self-pay | Admitting: Nurse Practitioner

## 2021-07-05 VITALS — BP 128/80 | HR 78 | Ht 66.5 in | Wt 171.8 lb

## 2021-07-05 DIAGNOSIS — I4819 Other persistent atrial fibrillation: Secondary | ICD-10-CM | POA: Insufficient documentation

## 2021-07-05 DIAGNOSIS — D6869 Other thrombophilia: Secondary | ICD-10-CM

## 2021-07-05 DIAGNOSIS — I10 Essential (primary) hypertension: Secondary | ICD-10-CM | POA: Diagnosis not present

## 2021-07-05 DIAGNOSIS — I484 Atypical atrial flutter: Secondary | ICD-10-CM

## 2021-07-05 DIAGNOSIS — Z7901 Long term (current) use of anticoagulants: Secondary | ICD-10-CM | POA: Diagnosis not present

## 2021-07-05 DIAGNOSIS — Z7984 Long term (current) use of oral hypoglycemic drugs: Secondary | ICD-10-CM | POA: Diagnosis not present

## 2021-07-05 DIAGNOSIS — Z79890 Hormone replacement therapy (postmenopausal): Secondary | ICD-10-CM | POA: Diagnosis not present

## 2021-07-05 DIAGNOSIS — Z79899 Other long term (current) drug therapy: Secondary | ICD-10-CM | POA: Diagnosis not present

## 2021-07-05 DIAGNOSIS — I483 Typical atrial flutter: Secondary | ICD-10-CM | POA: Insufficient documentation

## 2021-07-05 LAB — BASIC METABOLIC PANEL
Anion gap: 6 (ref 5–15)
BUN: 21 mg/dL (ref 8–23)
CO2: 27 mmol/L (ref 22–32)
Calcium: 9.5 mg/dL (ref 8.9–10.3)
Chloride: 106 mmol/L (ref 98–111)
Creatinine, Ser: 0.92 mg/dL (ref 0.44–1.00)
GFR, Estimated: 60 mL/min — ABNORMAL LOW (ref >60)
Glucose, Bld: 141 mg/dL — ABNORMAL HIGH (ref 70–99)
Potassium: 4.9 mmol/L (ref 3.5–5.1)
Sodium: 139 mmol/L (ref 135–145)

## 2021-07-05 LAB — CBC
HCT: 43.1 % (ref 36.0–46.0)
Hemoglobin: 14.1 g/dL (ref 12.0–15.0)
MCH: 31.5 pg (ref 26.0–34.0)
MCHC: 32.7 g/dL (ref 30.0–36.0)
MCV: 96.4 fL (ref 80.0–100.0)
Platelets: 163 10*3/uL (ref 150–400)
RBC: 4.47 MIL/uL (ref 3.87–5.11)
RDW: 13 % (ref 11.5–15.5)
WBC: 4.6 10*3/uL (ref 4.0–10.5)
nRBC: 0 % (ref 0.0–0.2)

## 2021-07-05 LAB — MAGNESIUM: Magnesium: 2.2 mg/dL (ref 1.7–2.4)

## 2021-07-05 NOTE — Patient Instructions (Signed)
Morning of Cardioversion Decrease Metoprolol '50mg'$ - Taking one tablet by mouth twice daily  Cardioversion scheduled for August 29th 2022  - Arrive at the [facility] and go to admitting at 10:30am  - Do not eat or drink anything after midnight the night prior to your procedure.  - Take all your morning medication (except diabetic medications) with a sip of water prior to arrival.  - You will not be able to drive home after your procedure.  - Do NOT miss any doses of your blood thinner - if you should miss a dose please notify our office immediately.  - If you feel as if you go back into normal rhythm prior to scheduled cardioversion, please notify our office immediately. If your procedure is canceled in the cardioversion suite you will be charged a cancellation fee.  Patients will be asked to: to mask in public and hand hygiene (no longer quarantine) in the 3 days prior to surgery, to report if any COVID-19-like illness or household contacts to COVID-19 to determine need for testing

## 2021-07-05 NOTE — Progress Notes (Signed)
Primary Care Physician: Lavone Orn, MD Cardiologist: [name] Pulmonologist: Dr. Elsworth Soho GI: Dr. Dolphus [name] is a 87 y.o. female with a h/o afib that has been on amiodarone since June of 2015. She has had many breakthrough events, last one in February with successful cardioversion. She does feel very weak in afib. She went into afib around 2 weeks ago and a RN came out to her house on Friday and documented the afib.  Dr. Rayann Heman was called and left instructions to increase amio to 200 mg bid and stop losartan. Apparently her BP was low on that day.  She is in now in the afib clinc and she is rate controlled and BP is stable. She reports around 2-3 weeks ago she first had 2 teeth pulled and then a shot of cortisone in her knee. She hasn't felt well since. She usually requires a cardioversion to restore SR.    F/u in afib clinic 7/15 after successful cardioversion. She is in SR today and feels improved. She is back to Amio 200 mg daily.  F/u in afib clinic, 11/25/19, with feelings of not being well and being out of rhythm x one week. She has had an UTI and just finished a round of doxycycline. EKG shows atrial flutter  variable rate at 110 bpm.CHA2DS2-VASc Score = 6, continues eliquis 5 mg bid. 1   F/u in  afib clinic, 12/16/18. She had a successful cardioversion but unfortunately felt like it only lasted one day. She is in afib in the 90's. I will try to increase BB and keep amio  at 200 mg a little longer to see if she may return to [place].   F/u  in afib clinic, 01/15/20.  F/u cardioversion 2/17. She was kept on her higher dose of BB at time of cardioversion as she did covert out but with PAC's. She saw [name] one week out and was staying in [place]. She  was left on amiodarone 200 mg bid until f/u here. She called the office earlier in the week and c/o of feeling weak. EKG shows  Sinus brady at 48 bpm. I feel her bradycardia is the reason for her weakness and will titrate meds down today.    F/u 01/22/20. She remains  in SR  in the low 60's but does not feel that much better.  Back on amiodarone 200 mg daily from  BID and lower dose of BB. Marland Kitchen She had some GI distress yesterday and recently had issues with UTI. She  denies urgency or frequency today.  F/u in afib clinic, 4/1, as pt has gone back into afib. Discussed with her I feel that amiodarone is no longer effective and I think it is best to stop drug at this point and rate control. If qt shortens off 3 month amiodarone wash out, currently at 512 ms then tikosyn may be an option down the line. She has multiple cardioversions with significant ERAF.  F/u in afib clinic, 4/14. She is feeling better off amiodarone and she is rate controlled in the 80's but would like to see her a little slower so will increase BB today. We can consider Tikosyn after  3 month amiodarone wash out if qt interval shortens.    She actually is feeling better and had the enegy to do more around her house and some light exercise. She is upset today as she had some diarrhea this am and had an accident in the clinic.i feel this  is why her BP is higher today as she usually  does not have high BP. She feels it may the the plate of food her neighbor gave her last night. She is rate controlled at home in the 70's and 80's.   F/u in afib clinic, 6/29. She remains in rate controlled afib and states that she feels very good. She is exercising without any significant shortness of breath and is rate controlled. No excessive fluid retention. I feel that amiodarone may have been contributing to fatigue and she feels better off drug. She remains on eliquis 5 mg bid for a CHA2DS2VASc score of 6.  F/u in afib clinic, 08/04/20. She  remains in rate controlled afib and initially tells me that she is exercising and feels great, but then tells me she  feels bad and would love to get back in rhythm. QT corrected around 430 ms. She has now been off amiodarone x 5 months.   F/u in afib  clinic, 10/19/20,  as pt wants to be admitted for Tikosyn initiation. She  states that she feels terrible in afib. She has been living afib for several months . I cut back her BB yesterday as she is slow in SR when she returns to SR and she is  not quite as well rate controlled today. She has been on 50 mg metoprolol bid with nice rate control.   She failed amiodarone after taking for years  and it was stopped late Spring after multiple cardioversion that would only hold short term.  She  has been off drug for sufficient period of time to try Tikosyn Her qtc today is 480 ms, but in SR with amiodarone on board was 460 ms, EKg  in spring 2015 without amio was 439 ms.  No benadryl use.  No missed anticoagulation. Electrolytes ok for admission with crcl cal at 64 so she will qualify for 500 msc. Covid negative. + vaccines.  F/u in afib clinic, 10/29/20,  one week after hospital admit for Tikosyn. Her qt prolonged and she was discharged on 125 mcg bid. She  did require cardioversion. She states today she feels improved. Her qt is 500 ms. I will need to recheck qt early next week.   F/u in afib clinic, 11/26/20, as she reported to the office last week that she went back into aifb. She states that she got stressed over her son falling and breaking his knee and a covid outbreak in the family but she was not exposed.  Ekg today shows atrial flutter with RVR with v rates in the 130's. Despite this pt feels well.  Will schedule cardioversion. She continues on Tikosyn 125 mcg bid. No missed eliquis with CHA2DS2VASc score of 6.   Follow up in the AF clinic 06/13/21. Patient reports that she went back into afib a few days after calling to cancel her DCCV in 01/2021. She states she has been in afib for months. She went to see her PCP on 7/28 and was told her heart rates were elevated. It is unclear how symptomatic she is with her afib.   F/u in afib clinic. She remains in afib with RVR. We discussed cardioversion vrs further  adjustment of her meds. She would prefer to increase BB again and if the does not improve things then pursue CV.   F/u 07/05/21. Despite increase in BB, pt has not converted to SR and remains in afib, rate controlled. She feels better in SR, she has fatigue. With her being  on tikosyn, we will try one more cardioversion but if this fails to keep her in rhythm, we will need to discuss stopping tikosyn and living in afib. She  has failed amiodarone in the past as well.   Today, she denies symptoms of chest pain, shortness of breath, orthopnea, PND, lower extremity edema, dizziness, presyncope, syncope, or neurologic sequela.Perisistent cough. The patient is tolerating medications without difficulties and is otherwise without complaint today.   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   Allergy    SEASONAL   Anemia    years ago   Anticoagulated by anticoagulation treatment - Eliquis 03/30/2014   Started May 2015    Anxiety state 10/13/2010   Qualifier: Diagnosis of  By: Koleen Distance CMA (AAMA), Leisha     Arthritis    "all my joints" (03/31/2014)   Barrett's esophagus    Breast cancer (HCC)    s/p right breast lumpectomy   CARDIAC MURMUR 10/13/2010   Qualifier: Diagnosis of  By: Koleen Distance CMA (AAMA), Leisha     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   Cervical cancer (HCC)    Chronic cough 01/02/2017   GERD, hiatal hernia Doubt amio   COLONIC POLYPS, ADENOMATOUS, HX OF 10/13/2010   Qualifier: Diagnosis of  By: Koleen Distance CMA (AAMA), Leisha     Degenerative disorder of eye 07/25/2012   Diabetes mellitus without complication (HCC)    Dilated cardiomyopathy (HCC) 08/27/2018   DIVERTICULOSIS, COLON 10/13/2010   Qualifier: Diagnosis of  By: Koleen Distance CMA (AAMA), Leisha     DJD (degenerative joint disease) of knee    Dysrhythmia    Afib    Echocardiogram 07/2020    Echo 9/21: EF 70-75, moderate LVH, GR 1 DD, RVSP 40.5 (mildly elevated), normal RVSF, moderate LAE, mild RAE, mild MR, trivial AI   EROSIVE ESOPHAGITIS 10/13/2010   Qualifier: Diagnosis of  By: Koleen Distance CMA (AAMA), Leisha     Error, refractive, myopia 07/09/2012   Essential hypertension 10/13/2010   Qualifier: Diagnosis of  By: Koleen Distance CMA (AAMA), [place] Cancer, intermittent 07/09/2012   GERD 10/14/2010   Qualifier: Diagnosis of  By: Jarold Motto MD Mervyn Skeeters R    GERD (gastroesophageal reflux disease)    Glaucoma    H/O hiatal hernia    Heart murmur    Hiatal hernia s/p robotic repair & fundoplication 05/25/2017 10/13/2010   Qualifier: Diagnosis of  By: Koleen Distance CMA (AAMA), Hulan Saas     History of laser assisted in situ keratomileusis 07/25/2012   Hx of transesophageal echocardiography (TEE) for monitoring    TEE (03/2014): No LAA clot, moderate LAE, core triatriatum type structure in RA with no stenosis, normal EF 55-60%, mild MR   HYPERCHOLESTEROLEMIA 10/13/2010   Qualifier: Diagnosis of  By: Koleen Distance CMA (AAMA), Leisha     Hypertension    Hypothyroidism, postsurgical    Iron Deficiency Anemia 10/14/2010   Qualifier: Diagnosis of  By: Jarold Motto MD Mervyn Skeeters R    Irritable bowel syndrome 10/13/2010   Qualifier: Diagnosis of  By: Koleen Distance CMA (AAMA), Leisha     Lumbar stenosis with neurogenic claudication 04/20/2016   Malignant neoplasm of breast (HCC) 02/25/2016   Migraine    "last one was years ago" (03/31/2014)   MIGRAINE HEADACHE 10/13/2010   Qualifier: Diagnosis of  By: Koleen Distance CMA (AAMA), Hulan Saas  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   Overview:  X(T)    OSA on CPAP    settings at 10-11    OSTEOARTHRITIS 10/13/2010   Qualifier: Diagnosis of  By: Nils Pyle CMA (AAMA), Leisha     Persistent atrial fibrillation 03/31/2015   DLCO dropped from 100% in 2015-50% in 01/2017   Post-surgical hypothyroidism 04/09/2014   RECTAL FISSURE 10/13/2010   Qualifier: Diagnosis of  By: Nils Pyle  CMA (AAMA), Mearl Latin     S/P Nissen fundoplication (without gastrostomy tube) procedure 05/25/2017   Sleep apnea    THYROID CANCER 10/13/2010   Qualifier: Diagnosis of  By: Nils Pyle CMA Deborra Medina), Mearl Latin     Thyroid cancer [facility])    s/p thyroidectomy   Type II diabetes mellitus ([place])    type II    Past Surgical History:  Procedure Laterality Date   ABDOMINAL HYSTERECTOMY     "partial"   BREAST BIOPSY Right    BREAST LUMPECTOMY Right    CARDIOVERSION N/A 03/30/2014   Procedure: CARDIOVERSION - BEDSIDE;  Surgeon: [name], MD;  Location: [place];  Service: Cardiovascular;  Laterality: N/A;   CARDIOVERSION N/A 03/31/2014   Procedure: CARDIOVERSION  (BEDSIDE) ;  Surgeon: Lelon Perla, MD;  Location: [place];  Service: Cardiovascular;  Laterality: N/A;   CARDIOVERSION N/A 04/27/2014   Procedure: CARDIOVERSION;  Surgeon: Darlin Coco, MD;  Location: [place];  Service: Cardiovascular;  Laterality: N/A;   CARDIOVERSION N/A 04/13/2015   Procedure: CARDIOVERSION;  Surgeon: Thayer Headings, MD;  Location: [place];  Service: Cardiovascular;  Laterality: N/A;   CARDIOVERSION N/A 01/08/2017   Procedure: CARDIOVERSION;  Surgeon: Sanda Klein, MD;  Location: [facility] ENDOSCOPY;  Service: Cardiovascular;  Laterality: N/A;   CARDIOVERSION N/A 09/19/2017   Procedure: CARDIOVERSION;  Surgeon: [name], MD;  Location: [place];  Service: Cardiovascular;  Laterality: N/A;   CARDIOVERSION N/A 10/25/2017   Procedure: CARDIOVERSION;  Surgeon: Nahser, Wonda Cheng, MD;  Location: [place];  Service: Cardiovascular;  Laterality: N/A;   CARDIOVERSION N/A 12/27/2018   Procedure: CARDIOVERSION;  Surgeon: [name], MD;  Location: [place];  Service: Cardiovascular;  Laterality: N/A;   CARDIOVERSION N/A 05/20/2019   Procedure: CARDIOVERSION;  Surgeon: [name], MD;  Location: [place];  Service: Cardiovascular;  Laterality: N/A;   CARDIOVERSION N/A 12/10/2019   Procedure:  CARDIOVERSION;  Surgeon: Pixie Casino, MD;  Location: [place];  Service: Cardiovascular;  Laterality: N/A;   CARDIOVERSION N/A 12/31/2019   Procedure: CARDIOVERSION;  Surgeon: Sanda Klein, MD;  Location: [place];  Service: Cardiovascular;  Laterality: N/A;   CARDIOVERSION N/A 10/21/2020   Procedure: CARDIOVERSION;  Surgeon: [name], MD;  Location: [place];  Service: Cardiovascular;  Laterality: N/A;   CARDIOVERSION N/A 12/03/2020   Procedure: CARDIOVERSION;  Surgeon: Jerline Pain, MD;  Location: [place] ENDOSCOPY;  Service: Cardiovascular;  Laterality: N/A;   CARPAL TUNNEL RELEASE Right    CATARACT EXTRACTION W/ INTRAOCULAR LENS  IMPLANT, BILATERAL Bilateral    COLONOSCOPY     EXCISIONAL HEMORRHOIDECTOMY     INSERTION OF MESH N/A 05/25/2017   Procedure: INSERTION OF MESH;  Surgeon: Ralene Ok, MD;  Location: [facility];  Service: General;  Laterality: N/A;   TEE WITHOUT CARDIOVERSION N/A 03/30/2014   Procedure: TRANSESOPHAGEAL ECHOCARDIOGRAM (TEE);  Surgeon: [name], MD;  Location: [facility] ENDOSCOPY;  Service: Cardiovascular;  Laterality: N/A;   THYROIDECTOMY     TONSILLECTOMY      Current Outpatient Medications  Medication Sig Dispense Refill  ACCU-CHEK GUIDE test strip      acetaminophen (TYLENOL) 650 MG CR tablet Take 650 mg by mouth every 8 (eight) hours.     Blood Glucose Monitoring Suppl (ACCU-CHEK GUIDE ME) w/Device KIT      Calcium Citrate (CITRACAL PO) Take 800 mg by mouth daily.      cetirizine (ZYRTEC) 10 MG tablet Take 10 mg by mouth daily.     cholecalciferol (VITAMIN D) 25 MCG (1000 UNIT) tablet Take 1,000 Units by mouth daily.     diclofenac Sodium (VOLTAREN) 1 % GEL Apply 2 g topically daily as needed (Knee pain).     dofetilide (TIKOSYN) 125 MCG capsule Take 1 capsule (125 mcg total) by mouth 2 (two) times daily. 60 capsule 11   ELIQUIS 5 MG TABS tablet TAKE 1 TABLET TWICE DAILY 180 tablet 3   ferrous sulfate 325 (65 FE) MG tablet Take 325  mg by mouth daily with breakfast.     fluticasone (FLONASE) 50 MCG/ACT nasal spray Place 1 spray into both nostrils at bedtime as needed for allergies.      lansoprazole (PREVACID) 30 MG capsule Take 30 mg by mouth daily as needed (acid reflux).      levothyroxine (SYNTHROID) 112 MCG tablet Take 112 mcg by mouth at bedtime. Middle of night     LORazepam (ATIVAN) 1 MG tablet Take 1.5 mg by mouth at bedtime.      Lutein 6 MG CAPS Take 6 mg by mouth daily.      magnesium oxide (MAG-OX) 400 MG tablet TAKE ONE TABLET BY MOUTH TWICE A DAY 60 tablet 6   metFORMIN (GLUCOPHAGE-XR) 500 MG 24 hr tablet Take 500 mg by mouth 2 (two) times daily.     metoprolol tartrate (LOPRESSOR) 50 MG tablet Take 2 tablets by mouth in the am and take two tablets in the evening 180 tablet 3   Multiple Vitamins-Minerals (PRESERVISION AREDS 2) CAPS Take 1 capsule by mouth daily.     Polyvinyl Alcohol-Povidone PF 1.4-0.6 % SOLN Place 1 drop into both eyes daily as needed (dry eyes).      potassium chloride SA (KLOR-CON) 20 MEQ tablet Take 1 tablet (20 mEq total) by mouth 2 (two) times daily.     rosuvastatin (CRESTOR) 10 MG tablet Take 10 mg by mouth at bedtime.     sertraline (ZOLOFT) 50 MG tablet Take 50 mg by mouth daily.     vitamin B-12 (CYANOCOBALAMIN) 1000 MCG tablet Take 500 mcg by mouth daily.     zinc gluconate 50 MG tablet Take 50 mg by mouth daily.     No current facility-administered medications for this encounter.    Allergies  Allergen Reactions   Penicillins Itching, Rash and Other (See Comments)    Did it involve swelling of the face/tongue/throat, SOB, or low BP? No Did it involve sudden or severe rash/hives, skin peeling, or any reaction on the inside of your mouth or nose? No Did you need to seek medical attention at a hospital or doctor's office? No When did it last happen?    30+ years   If all above answers are "NO", may proceed with cephalosporin use.     Social History   Socioeconomic  History   Marital status: Divorced    Spouse name: Not on file   Number of children: 2   Years of education: Not on file   Highest education level: Not on file  Occupational History   Occupation: Retired  Comment: school teacher  Tobacco Use   Smoking status: Never   Smokeless tobacco: Never  Vaping Use   Vaping Use: Never used  Substance and Sexual Activity   Alcohol use: No    Alcohol/week: 0.0 standard drinks   Drug use: No   Sexual activity: Never  Other Topics Concern   Not on file  Social History Narrative   Not on file   Social Determinants of Health   Financial Resource Strain: Not on file  Food Insecurity: Not on file  Transportation Needs: Not on file  Physical Activity: Not on file  Stress: Not on file  Social Connections: Not on file  Intimate Partner Violence: Not on file    Family History  Problem Relation Age of Onset   Prostate cancer Father    Diabetes Sister    Heart disease Sister    Heart attack Brother    Heart disease Brother    Diabetes Brother    Diabetes Sister    Diabetes Sister    Pancreatic cancer Sister    Diabetes Brother    Heart disease Brother    Diabetes Brother    Heart disease Brother    Diabetes Brother    Heart disease Brother    Diabetes Brother    Heart disease Brother    Diabetes Brother    Heart disease Brother    Diabetes Brother    Diabetes Child     ROS- All systems are reviewed and negative except as per the HPI above  Physical Exam: Vitals:   07/05/21 1408  BP: 128/80  Pulse: 78  Weight: 77.9 kg  Height: 5' 6.5" (1.689 m)   Wt Readings from Last 3 Encounters:  07/05/21 77.9 kg  06/30/21 78.2 kg  06/13/21 76.7 kg    Labs: Lab Results  Component Value Date   NA 141 01/10/2021   K 4.9 01/10/2021   CL 105 01/10/2021   CO2 26 01/10/2021   GLUCOSE 107 (H) 01/10/2021   BUN 17 01/10/2021   CREATININE 0.74 01/10/2021   CALCIUM 9.5 01/10/2021   MG 2.1 12/23/2020   Lab Results  Component  Value Date   INR 1.2 10/21/2020   No results found for: CHOL, HDL, LDLCALC, TRIG   GEN- The patient is a well appearing elderly female, alert and oriented x 3 today.   HEENT-head normocephalic, atraumatic, sclera clear, conjunctiva pink, hearing intact, trachea midline. Lungs- Clear to ausculation bilaterally, normal work of breathing Heart- irregular rate and rhythm, no murmurs, rubs or gallops  GI- soft, NT, ND, + BS Extremities- no clubbing, cyanosis, or edema MS- no significant deformity or atrophy Skin- no rash or lesion Psych- euthymic mood, full affect Neuro- strength and sensation are intact   EKG- atypical atrial flutter with variable block at 11 bpm, qrs int 90 ms, qtc 503 ms   Epic records reviewed   Assessment and Plan: 1. Persistent atrial fibrillation/atrial flutter Recall previous failure of amiodarone.  Loaded on dofetilide 10/2020 Patient in atypical atrial flutter.  She states she is very tired in afib and has failed to convert to 100 mg metoprolol bid She would like another cardioversion, will schedule  Decrease metoprolol back to 50 mg bid from 100 mg bid as she can have brady on return to SR  Continue dofetilide 125 mcg BID   Bmet/mag/cbc  2. HTN Stable, no changes today.  3. CHA2DS2VASc score of 6 Eliquis 5 mg BID States no missed does for at least 3  weeks    Follow up in the AF clinic in one week after CV   Ben Sanz C. Cathline Dowen, [facility] [address] [place], [place] [address] [phone]

## 2021-07-05 NOTE — H&P (View-Only) (Signed)
Primary Care Physician: Lavone Orn, MD Cardiologist: [name] Pulmonologist: Dr. Elsworth Soho GI: Dr. Dolphus [name] is a 87 y.o. female with a h/o afib that has been on amiodarone since June of 2015. She has had many breakthrough events, last one in February with successful cardioversion. She does feel very weak in afib. She went into afib around 2 weeks ago and a RN came out to her house on Friday and documented the afib.  Dr. Rayann Heman was called and left instructions to increase amio to 200 mg bid and stop losartan. Apparently her BP was low on that day.  She is in now in the afib clinc and she is rate controlled and BP is stable. She reports around 2-3 weeks ago she first had 2 teeth pulled and then a shot of cortisone in her knee. She hasn't felt well since. She usually requires a cardioversion to restore SR.    F/u in afib clinic 7/15 after successful cardioversion. She is in SR today and feels improved. She is back to Amio 200 mg daily.  F/u in afib clinic, 11/25/19, with feelings of not being well and being out of rhythm x one week. She has had an UTI and just finished a round of doxycycline. EKG shows atrial flutter  variable rate at 110 bpm.CHA2DS2-VASc Score = 6, continues eliquis 5 mg bid. 1   F/u in  afib clinic, 12/16/18. She had a successful cardioversion but unfortunately felt like it only lasted one day. She is in afib in the 90's. I will try to increase BB and keep amio  at 200 mg a little longer to see if she may return to [place].   F/u  in afib clinic, 01/15/20.  F/u cardioversion 2/17. She was kept on her higher dose of BB at time of cardioversion as she did covert out but with PAC's. She saw [name] one week out and was staying in [place]. She  was left on amiodarone 200 mg bid until f/u here. She called the office earlier in the week and c/o of feeling weak. EKG shows  Sinus brady at 48 bpm. I feel her bradycardia is the reason for her weakness and will titrate meds down today.    F/u 01/22/20. She remains  in SR  in the low 60's but does not feel that much better.  Back on amiodarone 200 mg daily from  BID and lower dose of BB. Marland Kitchen She had some GI distress yesterday and recently had issues with UTI. She  denies urgency or frequency today.  F/u in afib clinic, 4/1, as pt has gone back into afib. Discussed with her I feel that amiodarone is no longer effective and I think it is best to stop drug at this point and rate control. If qt shortens off 3 month amiodarone wash out, currently at 512 ms then tikosyn may be an option down the line. She has multiple cardioversions with significant ERAF.  F/u in afib clinic, 4/14. She is feeling better off amiodarone and she is rate controlled in the 80's but would like to see her a little slower so will increase BB today. We can consider Tikosyn after  3 month amiodarone wash out if qt interval shortens.    She actually is feeling better and had the enegy to do more around her house and some light exercise. She is upset today as she had some diarrhea this am and had an accident in the clinic.i feel this  is why her BP is higher today as she usually  does not have high BP. She feels it may the the plate of food her neighbor gave her last night. She is rate controlled at home in the 70's and 80's.   F/u in afib clinic, 6/29. She remains in rate controlled afib and states that she feels very good. She is exercising without any significant shortness of breath and is rate controlled. No excessive fluid retention. I feel that amiodarone may have been contributing to fatigue and she feels better off drug. She remains on eliquis 5 mg bid for a CHA2DS2VASc score of 6.  F/u in afib clinic, 08/04/20. She  remains in rate controlled afib and initially tells me that she is exercising and feels great, but then tells me she  feels bad and would love to get back in rhythm. QT corrected around 430 ms. She has now been off amiodarone x 5 months.   F/u in afib  clinic, 10/19/20,  as pt wants to be admitted for Tikosyn initiation. She  states that she feels terrible in afib. She has been living afib for several months . I cut back her BB yesterday as she is slow in SR when she returns to SR and she is  not quite as well rate controlled today. She has been on 50 mg metoprolol bid with nice rate control.   She failed amiodarone after taking for years  and it was stopped late Spring after multiple cardioversion that would only hold short term.  She  has been off drug for sufficient period of time to try Tikosyn Her qtc today is 480 ms, but in SR with amiodarone on board was 460 ms, EKg  in spring 2015 without amio was 439 ms.  No benadryl use.  No missed anticoagulation. Electrolytes ok for admission with crcl cal at 64 so she will qualify for 500 msc. Covid negative. + vaccines.  F/u in afib clinic, 10/29/20,  one week after hospital admit for Tikosyn. Her qt prolonged and she was discharged on 125 mcg bid. She  did require cardioversion. She states today she feels improved. Her qt is 500 ms. I will need to recheck qt early next week.   F/u in afib clinic, 11/26/20, as she reported to the office last week that she went back into aifb. She states that she got stressed over her son falling and breaking his knee and a covid outbreak in the family but she was not exposed.  Ekg today shows atrial flutter with RVR with v rates in the 130's. Despite this pt feels well.  Will schedule cardioversion. She continues on Tikosyn 125 mcg bid. No missed eliquis with CHA2DS2VASc score of 6.   Follow up in the AF clinic 06/13/21. Patient reports that she went back into afib a few days after calling to cancel her DCCV in 01/2021. She states she has been in afib for months. She went to see her PCP on 7/28 and was told her heart rates were elevated. It is unclear how symptomatic she is with her afib.   F/u in afib clinic. She remains in afib with RVR. We discussed cardioversion vrs further  adjustment of her meds. She would prefer to increase BB again and if the does not improve things then pursue CV.   F/u 07/05/21. Despite increase in BB, pt has not converted to SR and remains in afib, rate controlled. She feels better in SR, she has fatigue. With her being  on tikosyn, we will try one more cardioversion but if this fails to keep her in rhythm, we will need to discuss stopping tikosyn and living in afib. She  has failed amiodarone in the past as well.   Today, she denies symptoms of chest pain, shortness of breath, orthopnea, PND, lower extremity edema, dizziness, presyncope, syncope, or neurologic sequela.Perisistent cough. The patient is tolerating medications without difficulties and is otherwise without complaint today.   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   Allergy    SEASONAL   Anemia    years ago   Anticoagulated by anticoagulation treatment - Eliquis 03/30/2014   Started May 2015    Anxiety state 10/13/2010   Qualifier: Diagnosis of  By: Nils Pyle CMA (AAMA), Leisha     Arthritis    "all my joints" (03/31/2014)   Barrett's esophagus    Breast cancer ([place])    s/p right breast lumpectomy   CARDIAC MURMUR 10/13/2010   Qualifier: Diagnosis of  By: Nils Pyle CMA (AAMA), Leisha     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   Cervical cancer ([place])    Chronic cough 01/02/2017   GERD, hiatal hernia Doubt amio   COLONIC POLYPS, ADENOMATOUS, HX OF 10/13/2010   Qualifier: Diagnosis of  By: Nils Pyle CMA (AAMA), Leisha     Degenerative disorder of eye 07/25/2012   Diabetes mellitus without complication ([place])    Dilated cardiomyopathy ([place]) 08/27/2018   DIVERTICULOSIS, COLON 10/13/2010   Qualifier: Diagnosis of  By: Nils Pyle CMA (AAMA), Leisha     DJD (degenerative joint disease) of knee    Dysrhythmia    Afib    Echocardiogram 07/2020    Echo 9/21: EF 70-75, moderate LVH, GR 1 DD, RVSP 40.5 (mildly elevated), normal RVSF, moderate LAE, mild RAE, mild MR, trivial AI   EROSIVE ESOPHAGITIS 10/13/2010   Qualifier: Diagnosis of  By: Nils Pyle CMA (AAMA), Leisha     Error, refractive, myopia 07/09/2012   Essential hypertension 10/13/2010   Qualifier: Diagnosis of  By: Nils Pyle CMA (AAMA), Rueben Bash, intermittent 07/09/2012   GERD 10/14/2010   Qualifier: Diagnosis of  By: Sharlett Iles MD Cline Cools R    GERD (gastroesophageal reflux disease)    Glaucoma    H/O hiatal hernia    Heart murmur    Hiatal hernia s/p robotic repair & fundoplication 03/01/3789 24/0/9735   Qualifier: Diagnosis of  By: Nils Pyle CMA (AAMA), Mearl Latin     History of laser assisted in situ keratomileusis 07/25/2012   Hx of transesophageal echocardiography (TEE) for monitoring    TEE (03/2014): No LAA clot, moderate LAE, core triatriatum type structure in RA with no stenosis, normal EF 55-60%, mild MR   HYPERCHOLESTEROLEMIA 10/13/2010   Qualifier: Diagnosis of  By: Nils Pyle CMA (AAMA), Leisha     Hypertension    Hypothyroidism, postsurgical    Iron Deficiency Anemia 10/14/2010   Qualifier: Diagnosis of  By: Sharlett Iles MD Cline Cools R    Irritable bowel syndrome 10/13/2010   Qualifier: Diagnosis of  By: Nils Pyle CMA (AAMA), Leisha     Lumbar stenosis with neurogenic claudication 04/20/2016   Malignant neoplasm of breast ([place]) 02/25/2016   Migraine    "last one was years ago" (03/31/2014)   MIGRAINE HEADACHE 10/13/2010   Qualifier: Diagnosis of  By: Nils Pyle CMA (AAMA), Mearl Latin  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   Overview:  X(T)    OSA on CPAP    settings at 10-11    OSTEOARTHRITIS 10/13/2010   Qualifier: Diagnosis of  By: Nils Pyle CMA (AAMA), Leisha     Persistent atrial fibrillation 03/31/2015   DLCO dropped from 100% in 2015-50% in 01/2017   Post-surgical hypothyroidism 04/09/2014   RECTAL FISSURE 10/13/2010   Qualifier: Diagnosis of  By: Nils Pyle  CMA (AAMA), Mearl Latin     S/P Nissen fundoplication (without gastrostomy tube) procedure 05/25/2017   Sleep apnea    THYROID CANCER 10/13/2010   Qualifier: Diagnosis of  By: Nils Pyle CMA Deborra Medina), Mearl Latin     Thyroid cancer [facility])    s/p thyroidectomy   Type II diabetes mellitus ([place])    type II    Past Surgical History:  Procedure Laterality Date   ABDOMINAL HYSTERECTOMY     "partial"   BREAST BIOPSY Right    BREAST LUMPECTOMY Right    CARDIOVERSION N/A 03/30/2014   Procedure: CARDIOVERSION - BEDSIDE;  Surgeon: [name], MD;  Location: [place];  Service: Cardiovascular;  Laterality: N/A;   CARDIOVERSION N/A 03/31/2014   Procedure: CARDIOVERSION  (BEDSIDE) ;  Surgeon: Lelon Perla, MD;  Location: [place];  Service: Cardiovascular;  Laterality: N/A;   CARDIOVERSION N/A 04/27/2014   Procedure: CARDIOVERSION;  Surgeon: Darlin Coco, MD;  Location: [place];  Service: Cardiovascular;  Laterality: N/A;   CARDIOVERSION N/A 04/13/2015   Procedure: CARDIOVERSION;  Surgeon: Thayer Headings, MD;  Location: [place];  Service: Cardiovascular;  Laterality: N/A;   CARDIOVERSION N/A 01/08/2017   Procedure: CARDIOVERSION;  Surgeon: Sanda Klein, MD;  Location: [facility] ENDOSCOPY;  Service: Cardiovascular;  Laterality: N/A;   CARDIOVERSION N/A 09/19/2017   Procedure: CARDIOVERSION;  Surgeon: [name], MD;  Location: [place];  Service: Cardiovascular;  Laterality: N/A;   CARDIOVERSION N/A 10/25/2017   Procedure: CARDIOVERSION;  Surgeon: Nahser, Wonda Cheng, MD;  Location: [place];  Service: Cardiovascular;  Laterality: N/A;   CARDIOVERSION N/A 12/27/2018   Procedure: CARDIOVERSION;  Surgeon: [name], MD;  Location: [place];  Service: Cardiovascular;  Laterality: N/A;   CARDIOVERSION N/A 05/20/2019   Procedure: CARDIOVERSION;  Surgeon: [name], MD;  Location: [place];  Service: Cardiovascular;  Laterality: N/A;   CARDIOVERSION N/A 12/10/2019   Procedure:  CARDIOVERSION;  Surgeon: Pixie Casino, MD;  Location: [place];  Service: Cardiovascular;  Laterality: N/A;   CARDIOVERSION N/A 12/31/2019   Procedure: CARDIOVERSION;  Surgeon: Sanda Klein, MD;  Location: [place];  Service: Cardiovascular;  Laterality: N/A;   CARDIOVERSION N/A 10/21/2020   Procedure: CARDIOVERSION;  Surgeon: [name], MD;  Location: [place];  Service: Cardiovascular;  Laterality: N/A;   CARDIOVERSION N/A 12/03/2020   Procedure: CARDIOVERSION;  Surgeon: Jerline Pain, MD;  Location: [place] ENDOSCOPY;  Service: Cardiovascular;  Laterality: N/A;   CARPAL TUNNEL RELEASE Right    CATARACT EXTRACTION W/ INTRAOCULAR LENS  IMPLANT, BILATERAL Bilateral    COLONOSCOPY     EXCISIONAL HEMORRHOIDECTOMY     INSERTION OF MESH N/A 05/25/2017   Procedure: INSERTION OF MESH;  Surgeon: Ralene Ok, MD;  Location: [facility];  Service: General;  Laterality: N/A;   TEE WITHOUT CARDIOVERSION N/A 03/30/2014   Procedure: TRANSESOPHAGEAL ECHOCARDIOGRAM (TEE);  Surgeon: [name], MD;  Location: [facility] ENDOSCOPY;  Service: Cardiovascular;  Laterality: N/A;   THYROIDECTOMY     TONSILLECTOMY      Current Outpatient Medications  Medication Sig Dispense Refill  ACCU-CHEK GUIDE test strip      acetaminophen (TYLENOL) 650 MG CR tablet Take 650 mg by mouth every 8 (eight) hours.     Blood Glucose Monitoring Suppl (ACCU-CHEK GUIDE ME) w/Device KIT      Calcium Citrate (CITRACAL PO) Take 800 mg by mouth daily.      cetirizine (ZYRTEC) 10 MG tablet Take 10 mg by mouth daily.     cholecalciferol (VITAMIN D) 25 MCG (1000 UNIT) tablet Take 1,000 Units by mouth daily.     diclofenac Sodium (VOLTAREN) 1 % GEL Apply 2 g topically daily as needed (Knee pain).     dofetilide (TIKOSYN) 125 MCG capsule Take 1 capsule (125 mcg total) by mouth 2 (two) times daily. 60 capsule 11   ELIQUIS 5 MG TABS tablet TAKE 1 TABLET TWICE DAILY 180 tablet 3   ferrous sulfate 325 (65 FE) MG tablet Take 325  mg by mouth daily with breakfast.     fluticasone (FLONASE) 50 MCG/ACT nasal spray Place 1 spray into both nostrils at bedtime as needed for allergies.      lansoprazole (PREVACID) 30 MG capsule Take 30 mg by mouth daily as needed (acid reflux).      levothyroxine (SYNTHROID) 112 MCG tablet Take 112 mcg by mouth at bedtime. Middle of night     LORazepam (ATIVAN) 1 MG tablet Take 1.5 mg by mouth at bedtime.      Lutein 6 MG CAPS Take 6 mg by mouth daily.      magnesium oxide (MAG-OX) 400 MG tablet TAKE ONE TABLET BY MOUTH TWICE A DAY 60 tablet 6   metFORMIN (GLUCOPHAGE-XR) 500 MG 24 hr tablet Take 500 mg by mouth 2 (two) times daily.     metoprolol tartrate (LOPRESSOR) 50 MG tablet Take 2 tablets by mouth in the am and take two tablets in the evening 180 tablet 3   Multiple Vitamins-Minerals (PRESERVISION AREDS 2) CAPS Take 1 capsule by mouth daily.     Polyvinyl Alcohol-Povidone PF 1.4-0.6 % SOLN Place 1 drop into both eyes daily as needed (dry eyes).      potassium chloride SA (KLOR-CON) 20 MEQ tablet Take 1 tablet (20 mEq total) by mouth 2 (two) times daily.     rosuvastatin (CRESTOR) 10 MG tablet Take 10 mg by mouth at bedtime.     sertraline (ZOLOFT) 50 MG tablet Take 50 mg by mouth daily.     vitamin B-12 (CYANOCOBALAMIN) 1000 MCG tablet Take 500 mcg by mouth daily.     zinc gluconate 50 MG tablet Take 50 mg by mouth daily.     No current facility-administered medications for this encounter.    Allergies  Allergen Reactions   Penicillins Itching, Rash and Other (See Comments)    Did it involve swelling of the face/tongue/throat, SOB, or low BP? No Did it involve sudden or severe rash/hives, skin peeling, or any reaction on the inside of your mouth or nose? No Did you need to seek medical attention at a hospital or doctor's office? No When did it last happen?    30+ years   If all above answers are "NO", may proceed with cephalosporin use.     Social History   Socioeconomic  History   Marital status: Divorced    Spouse name: Not on file   Number of children: 2   Years of education: Not on file   Highest education level: Not on file  Occupational History   Occupation: Retired  Comment: school teacher  Tobacco Use   Smoking status: Never   Smokeless tobacco: Never  Vaping Use   Vaping Use: Never used  Substance and Sexual Activity   Alcohol use: No    Alcohol/week: 0.0 standard drinks   Drug use: No   Sexual activity: Never  Other Topics Concern   Not on file  Social History Narrative   Not on file   Social Determinants of Health   Financial Resource Strain: Not on file  Food Insecurity: Not on file  Transportation Needs: Not on file  Physical Activity: Not on file  Stress: Not on file  Social Connections: Not on file  Intimate Partner Violence: Not on file    Family History  Problem Relation Age of Onset   Prostate cancer Father    Diabetes Sister    Heart disease Sister    Heart attack Brother    Heart disease Brother    Diabetes Brother    Diabetes Sister    Diabetes Sister    Pancreatic cancer Sister    Diabetes Brother    Heart disease Brother    Diabetes Brother    Heart disease Brother    Diabetes Brother    Heart disease Brother    Diabetes Brother    Heart disease Brother    Diabetes Brother    Heart disease Brother    Diabetes Brother    Diabetes Child     ROS- All systems are reviewed and negative except as per the HPI above  Physical Exam: Vitals:   07/05/21 1408  BP: 128/80  Pulse: 78  Weight: 77.9 kg  Height: 5' 6.5" (1.689 m)   Wt Readings from Last 3 Encounters:  07/05/21 77.9 kg  06/30/21 78.2 kg  06/13/21 76.7 kg    Labs: Lab Results  Component Value Date   NA 141 01/10/2021   K 4.9 01/10/2021   CL 105 01/10/2021   CO2 26 01/10/2021   GLUCOSE 107 (H) 01/10/2021   BUN 17 01/10/2021   CREATININE 0.74 01/10/2021   CALCIUM 9.5 01/10/2021   MG 2.1 12/23/2020   Lab Results  Component  Value Date   INR 1.2 10/21/2020   No results found for: CHOL, HDL, LDLCALC, TRIG   GEN- The patient is a well appearing elderly female, alert and oriented x 3 today.   HEENT-head normocephalic, atraumatic, sclera clear, conjunctiva pink, hearing intact, trachea midline. Lungs- Clear to ausculation bilaterally, normal work of breathing Heart- irregular rate and rhythm, no murmurs, rubs or gallops  GI- soft, NT, ND, + BS Extremities- no clubbing, cyanosis, or edema MS- no significant deformity or atrophy Skin- no rash or lesion Psych- euthymic mood, full affect Neuro- strength and sensation are intact   EKG- atypical atrial flutter with variable block at 11 bpm, qrs int 90 ms, qtc 503 ms   Epic records reviewed   Assessment and Plan: 1. Persistent atrial fibrillation/atrial flutter Recall previous failure of amiodarone.  Loaded on dofetilide 10/2020 Patient in atypical atrial flutter.  She states she is very tired in afib and has failed to convert to 100 mg metoprolol bid She would like another cardioversion, will schedule  Decrease metoprolol back to 50 mg bid from 100 mg bid as she can have brady on return to SR  Continue dofetilide 125 mcg BID   Bmet/mag/cbc  2. HTN Stable, no changes today.  3. CHA2DS2VASc score of 6 Eliquis 5 mg BID States no missed does for at least 3  weeks    Follow up in the AF clinic in one week after CV   Jenine Krisher C. Dean Wonder, [facility] [address] [place], [place] [address] [phone]

## 2021-07-07 ENCOUNTER — Ambulatory Visit (HOSPITAL_COMMUNITY): Payer: Medicare PPO | Admitting: Nurse Practitioner

## 2021-07-11 ENCOUNTER — Ambulatory Visit (HOSPITAL_COMMUNITY)
Admission: RE | Admit: 2021-07-11 | Discharge: 2021-07-11 | Disposition: A | Discharge status: Home / Self Care | Payer: Medicare PPO | Attending: Internal Medicine | Admitting: Internal Medicine

## 2021-07-11 ENCOUNTER — Encounter (HOSPITAL_COMMUNITY)
Admission: RE | Disposition: A | Discharge status: Home / Self Care | Payer: Self-pay | Source: Home / Self Care | Attending: Internal Medicine

## 2021-07-11 ENCOUNTER — Ambulatory Visit (HOSPITAL_COMMUNITY): Payer: Medicare PPO | Admitting: Certified Registered Nurse Anesthetist

## 2021-07-11 ENCOUNTER — Other Ambulatory Visit: Payer: Self-pay

## 2021-07-11 DIAGNOSIS — I10 Essential (primary) hypertension: Secondary | ICD-10-CM | POA: Diagnosis not present

## 2021-07-11 DIAGNOSIS — I4819 Other persistent atrial fibrillation: Secondary | ICD-10-CM | POA: Insufficient documentation

## 2021-07-11 DIAGNOSIS — Z79899 Other long term (current) drug therapy: Secondary | ICD-10-CM | POA: Diagnosis not present

## 2021-07-11 DIAGNOSIS — Z7984 Long term (current) use of oral hypoglycemic drugs: Secondary | ICD-10-CM | POA: Diagnosis not present

## 2021-07-11 DIAGNOSIS — I4892 Unspecified atrial flutter: Secondary | ICD-10-CM | POA: Insufficient documentation

## 2021-07-11 DIAGNOSIS — I484 Atypical atrial flutter: Secondary | ICD-10-CM | POA: Diagnosis not present

## 2021-07-11 DIAGNOSIS — Z9989 Dependence on other enabling machines and devices: Secondary | ICD-10-CM | POA: Diagnosis not present

## 2021-07-11 DIAGNOSIS — G4733 Obstructive sleep apnea (adult) (pediatric): Secondary | ICD-10-CM | POA: Diagnosis not present

## 2021-07-11 HISTORY — PX: CARDIOVERSION: SHX1299

## 2021-07-11 LAB — GLUCOSE, CAPILLARY: Glucose-Capillary: 129 mg/dL — ABNORMAL HIGH (ref 70–99)

## 2021-07-11 SURGERY — CARDIOVERSION
Anesthesia: General

## 2021-07-11 MED ORDER — PROPOFOL 10 MG/ML IV BOLUS
INTRAVENOUS | Status: DC | PRN
  Administered 2021-07-11: 60 mg via INTRAVENOUS

## 2021-07-11 MED ORDER — SODIUM CHLORIDE 0.9 % IV SOLN
INTRAVENOUS | Status: AC | PRN
  Administered 2021-07-11: 500 mL via INTRAMUSCULAR

## 2021-07-11 MED ORDER — LIDOCAINE 2% (20 MG/ML) 5 ML SYRINGE
INTRAMUSCULAR | Status: DC | PRN
Start: 2021-07-11 — End: 2021-07-11
  Administered 2021-07-11: 60 mg via INTRAVENOUS

## 2021-07-11 MED ORDER — METOPROLOL TARTRATE 50 MG PO TABS: 50.0000 mg | ORAL_TABLET | Freq: Two times a day (BID) | ORAL | 3 refills | Status: DC

## 2021-07-11 NOTE — Interval H&P Note (Signed)
History and Physical Interval Note:  07/11/2021 10:27 AM  [name]  has presented today for surgery, with the diagnosis of ATRIAL FLUTTER.  The various methods of treatment have been discussed with the patient and family. After consideration of risks, benefits and other options for treatment, the patient has consented to  Procedure(s): CARDIOVERSION (N/A) as a surgical intervention.  The patient's history has been reviewed, patient examined, no change in status, stable for surgery.  I have reviewed the patient's chart and labs.  Questions were answered to the patient's satisfaction.     Pixie Casino

## 2021-07-11 NOTE — Transfer of Care (Signed)
Immediate Anesthesia Transfer of Care Note  Patient: [name]  Procedure(s) Performed: CARDIOVERSION  Patient Location: Endoscopy Unit  Anesthesia Type:General  Level of Consciousness: drowsy  Airway & Oxygen Therapy: Patient Spontanous Breathing  Post-op Assessment: Report given to RN and Post -op Vital signs reviewed and stable  Post vital signs: Reviewed and stable  Last Vitals:  Vitals Value Taken Time  BP    Temp    Pulse    Resp    SpO2      Last Pain:  Vitals:   07/11/21 1034  TempSrc: Oral  PainSc: 0-No pain         Complications: No notable events documented.

## 2021-07-11 NOTE — Anesthesia Procedure Notes (Signed)
Procedure Name: General with mask airway Date/Time: 07/11/2021 11:38 AM Performed by: Valda Favia, CRNA Pre-anesthesia Checklist: Patient identified, Emergency Drugs available, Suction available, Patient being monitored and Timeout performed Patient Re-evaluated:Patient Re-evaluated prior to induction Oxygen Delivery Method: Ambu bag Preoxygenation: Pre-oxygenation with 100% oxygen Induction Type: IV induction Ventilation: Mask ventilation without difficulty Placement Confirmation: positive ETCO2 Dental Injury: Teeth and Oropharynx as per pre-operative assessment

## 2021-07-11 NOTE — CV Procedure (Addendum)
   CARDIOVERSION NOTE  Procedure: Electrical Cardioversion Indications:  Atrial Fibrillation  Procedure Details:  Consent: Risks of procedure as well as the alternatives and risks of each were explained to the (patient/caregiver).  Consent for procedure obtained.  Time Out: Verified patient identification, verified procedure, site/side was marked, verified correct patient position, special equipment/implants available, medications/allergies/relevent history reviewed, required imaging and test results available.  Performed  Patient placed on cardiac monitor, pulse oximetry, supplemental oxygen as necessary.  Sedation given:  propofol per anesthesia Pacer pads placed anterior and posterior chest.  Cardioverted 1 time(s).  Cardioverted at 200J biphasic.  Impression: Findings: Post procedure EKG shows:  sinus bradycardia Complications: None Patient did tolerate procedure well.  Plan: Successful DCCV with a single 200J biphasic shock. Will decrease metoprolol to 50 mg BID d/t bradycardia.  Time Spent Directly with the Patient:  30 minutes   Pixie Casino, MD, [facility], [place] Director of the Advanced Lipid Disorders &  Cardiovascular Risk Reduction Clinic Diplomate of the American Board of Clinical Lipidology Attending Cardiologist  Direct Dial: [phone]  Fax: [phone]  Website:  [email or web address] 07/11/2021, 11:36 AM

## 2021-07-11 NOTE — Anesthesia Postprocedure Evaluation (Signed)
Anesthesia Post Note  Patient: [name]  Procedure(s) Performed: CARDIOVERSION     Patient location during evaluation: PACU Anesthesia Type: General Level of consciousness: awake and alert Pain management: pain level controlled Vital Signs Assessment: post-procedure vital signs reviewed and stable Respiratory status: spontaneous breathing, nonlabored ventilation and respiratory function stable Cardiovascular status: stable, blood pressure returned to baseline and bradycardic Anesthetic complications: no   No notable events documented.  Last Vitals:  Vitals:   07/11/21 1200 07/11/21 1201  BP: 129/62 129/62  Pulse: (!) 49 (!) 49  Resp: 17 17  Temp:    SpO2: 90% 90%    Last Pain:  Vitals:   07/11/21 1201  TempSrc:   PainSc: 0-No pain                 Audry Pili

## 2021-07-11 NOTE — Anesthesia Preprocedure Evaluation (Addendum)
Anesthesia Evaluation  Patient identified by MRN, date of birth, ID band Patient awake    Reviewed: Allergy & Precautions, NPO status , Patient's Chart, lab work & pertinent test results, reviewed documented beta blocker date and time   History of Anesthesia Complications Negative for: history of anesthetic complications  Airway Mallampati: II  TM Distance: >3 FB Neck ROM: Full    Dental  (+) Dental Advisory Given   Pulmonary sleep apnea and Continuous Positive Airway Pressure Ventilation ,    Pulmonary exam normal        Cardiovascular hypertension, Pt. on home beta blockers and Pt. on medications + dysrhythmias Atrial Fibrillation  Rhythm:Irregular Rate:Tachycardia   '21 TTE - EF 70 to 75%. Moderate left ventricular hypertrophy. Grade I diastolic dysfunction (impaired relaxation). There is mildly elevated pulmonary artery systolic pressure. Left atrial size was moderately dilated. Right atrial size was mildly dilated. Mild mitral valve regurgitation. Aortic valve regurgitation is trivial.     Neuro/Psych  Headaches, PSYCHIATRIC DISORDERS Anxiety    GI/Hepatic Neg liver ROS, hiatal hernia, GERD  Medicated and Controlled, IBS S/p Nissen    Endo/Other  diabetes, Type 2, Oral Hypoglycemic AgentsHypothyroidism  S/p thyroidectomy for thyroid cancer   Renal/GU negative Renal ROS     Musculoskeletal  (+) Arthritis ,   Abdominal   Peds  Hematology  On eliquis    Anesthesia Other Findings   Reproductive/Obstetrics  Breast cancer                             Anesthesia Physical Anesthesia Plan  ASA: 3  Anesthesia Plan: General   Post-op Pain Management:    Induction: Intravenous  PONV Risk Score and Plan: 3 and Treatment may vary due to age or medical condition and Propofol infusion  Airway Management Planned: Mask and Natural Airway  Additional Equipment: None  Intra-op Plan:    Post-operative Plan:   Informed Consent: I have reviewed the patients History and Physical, chart, labs and discussed the procedure including the risks, benefits and alternatives for the proposed anesthesia with the patient or authorized representative who has indicated his/her understanding and acceptance.       Plan Discussed with: CRNA and Anesthesiologist  Anesthesia Plan Comments:        Anesthesia Quick Evaluation

## 2021-07-12 ENCOUNTER — Telehealth (HOSPITAL_COMMUNITY): Payer: Self-pay | Admitting: *Deleted

## 2021-07-12 ENCOUNTER — Encounter (HOSPITAL_COMMUNITY): Payer: Self-pay | Admitting: Internal Medicine

## 2021-07-12 NOTE — Telephone Encounter (Signed)
Pt called stated this morning she is back out of rhythm HR 112 BP 158/108. Pt states she doesn't feel much different compared to yesterday. She will increase her metoprolol back to '100mg'$  BID and keep scheduled follow up.

## 2021-07-19 ENCOUNTER — Encounter (HOSPITAL_COMMUNITY): Payer: Self-pay | Admitting: Nurse Practitioner

## 2021-07-19 ENCOUNTER — Ambulatory Visit (HOSPITAL_COMMUNITY)
Admission: RE | Admit: 2021-07-19 | Discharge: 2021-07-19 | Disposition: A | Discharge status: Home / Self Care | Payer: Medicare PPO | Source: Ambulatory Visit | Attending: Nurse Practitioner | Admitting: Nurse Practitioner

## 2021-07-19 ENCOUNTER — Other Ambulatory Visit: Payer: Self-pay

## 2021-07-19 VITALS — BP 116/68 | HR 92 | Ht 66.0 in | Wt 172.6 lb

## 2021-07-19 DIAGNOSIS — D6869 Other thrombophilia: Secondary | ICD-10-CM | POA: Diagnosis not present

## 2021-07-19 DIAGNOSIS — E7800 Pure hypercholesterolemia, unspecified: Secondary | ICD-10-CM | POA: Insufficient documentation

## 2021-07-19 DIAGNOSIS — Z7901 Long term (current) use of anticoagulants: Secondary | ICD-10-CM | POA: Insufficient documentation

## 2021-07-19 DIAGNOSIS — Z880 Allergy status to penicillin: Secondary | ICD-10-CM | POA: Insufficient documentation

## 2021-07-19 DIAGNOSIS — Z8249 Family history of ischemic heart disease and other diseases of the circulatory system: Secondary | ICD-10-CM | POA: Insufficient documentation

## 2021-07-19 DIAGNOSIS — Z79899 Other long term (current) drug therapy: Secondary | ICD-10-CM | POA: Insufficient documentation

## 2021-07-19 DIAGNOSIS — I4819 Other persistent atrial fibrillation: Secondary | ICD-10-CM | POA: Diagnosis not present

## 2021-07-19 DIAGNOSIS — I4892 Unspecified atrial flutter: Secondary | ICD-10-CM | POA: Insufficient documentation

## 2021-07-19 DIAGNOSIS — I10 Essential (primary) hypertension: Secondary | ICD-10-CM | POA: Diagnosis not present

## 2021-07-19 NOTE — Patient Instructions (Signed)
No tikosyn tonight - restart tomorrow morning.

## 2021-07-19 NOTE — Progress Notes (Signed)
Primary Care Physician: Lavone Orn, MD Cardiologist: [name] Pulmonologist: Dr. Elsworth Soho GI: Dr. Dolphus Jenny I [name] is a 87 y.o. female with a h/o afib that has been on amiodarone since June of 2015. She has had many breakthrough events, last one in February with successful cardioversion. She does feel very weak in afib. She went into afib around 2 weeks ago and a RN came out to her house on Friday and documented the afib.  Dr. Rayann Heman was called and left instructions to increase amio to 200 mg bid and stop losartan. Apparently her BP was low on that day.  She is in now in the afib clinc and she is rate controlled and BP is stable. She reports around 2-3 weeks ago she first had 2 teeth pulled and then a shot of cortisone in her knee. She hasn't felt well since. She usually requires a cardioversion to restore SR.    F/u in afib clinic 05/28/19 after successful cardioversion. She is in SR today and feels improved. She is back to Amio 200 mg daily.  F/u in afib clinic, 11/25/19, with feelings of not being well and being out of rhythm x one week. She has had an UTI and just finished a round of doxycycline. EKG shows atrial flutter  variable rate at 110 bpm.CHA2DS2-VASc Score = 6, continues eliquis 5 mg bid. 1   F/u in  afib clinic, 12/16/18. She had a successful cardioversion but unfortunately felt like it only lasted one day. She is in afib in the 90's. I will try to increase BB and keep amio  at 200 mg a little longer to see if she may return to [place].   F/u  in afib clinic, 01/15/20.  F/u cardioversion 2/17. She was kept on her higher dose of BB at time of cardioversion as she did covert out but with PAC's. She saw [name] one week out and was staying in [place]. She  was left on amiodarone 200 mg bid until f/u here. She called the office earlier in the week and c/o of feeling weak. EKG shows  Sinus brady at 48 bpm. I feel her bradycardia is the reason for her weakness and will titrate meds down  today.   F/u 01/22/20. She remains  in SR  in the low 60's but does not feel that much better.  Back on amiodarone 200 mg daily from  BID and lower dose of BB. Marland Kitchen She had some GI distress yesterday and recently had issues with UTI. She  denies urgency or frequency today.  F/u in afib clinic, 4/1, as pt has gone back into afib. Discussed with her I feel that amiodarone is no longer effective and I think it is best to stop drug at this point and rate control. If qt shortens off 3 month amiodarone wash out, currently at 512 ms then tikosyn may be an option down the line. She has multiple cardioversions with significant ERAF.  F/u in afib clinic, 4/14. She is feeling better off amiodarone and she is rate controlled in the 80's but would like to see her a little slower so will increase BB today. We can consider Tikosyn after  3 month amiodarone wash out if qt interval shortens.   03/10/21- She actually is feeling better and had the enegy to do more around her house and some light exercise. She is upset today as she had some diarrhea this am and had an accident in the clinic. I feel  this is why her BP is higher today as she usually  does not have high BP. She feels it may the the plate of food her neighbor gave her last night. She is rate controlled at home in the 70's and 80's.   F/u in afib clinic, 6/29. She remains in rate controlled afib and states that she feels very good. She is exercising without any significant shortness of breath and is rate controlled. No excessive fluid retention. I feel that amiodarone may have been contributing to fatigue and she feels better off drug. She remains on eliquis 5 mg bid for a CHA2DS2VASc score of 6.  F/u in afib clinic, 08/04/20. She  remains in rate controlled afib and initially tells me that she is exercising and feels great, but then tells me she  feels bad and would love to get back in rhythm. QT corrected around 430 ms. She has now been off amiodarone x 5 months.    F/u in afib clinic, 10/19/20,  as pt wants to be admitted for Tikosyn initiation. She  states that she feels terrible in afib. She has been living afib for several months . I cut back her BB yesterday as she is slow in SR when she returns to SR and she is  not quite as well rate controlled today. She has been on 50 mg metoprolol bid with nice rate control.   She failed amiodarone after taking for years  and it was stopped late Spring after multiple cardioversion that would only hold short term.  She  has been off drug for sufficient period of time to try Tikosyn Her qtc today is 480 ms, but in SR with amiodarone on board was 460 ms, EKg  in spring 2015 without amio was 439 ms.  No benadryl use.  No missed anticoagulation. Electrolytes ok for admission with crcl cal at 64 so she will qualify for 500 msc. Covid negative. + vaccines.  F/u in afib clinic, 10/29/20,  one week after hospital admit for Tikosyn. Her qt prolonged and she was discharged on 125 mcg bid. She  did require cardioversion. She states today she feels improved. Her qt is 500 ms. I will need to recheck qt early next week.   F/u in afib clinic, 11/26/20, as she reported to the office last week that she went back into aifb. She states that she got stressed over her son falling and breaking his knee and a covid outbreak in the family but she was not exposed.  Ekg today shows atrial flutter with RVR with v rates in the 130's. Despite this pt feels well.  Will schedule cardioversion. She continues on Tikosyn 125 mcg bid. No missed eliquis with CHA2DS2VASc score of 6.   Follow up in the AF clinic 06/13/21. Patient reports that she went back into afib a few days after calling to cancel her DCCV in 01/2021. She states she has been in afib for months. She went to see her PCP on 7/28 and was told her heart rates were elevated. It is unclear how symptomatic she is with her afib.   F/u in afib clinic 06/30/21. She remains in afib with RVR. We discussed  cardioversion vrs further adjustment of her meds. She would prefer to increase BB again and if the does not improve things then pursue CV.   F/u 07/05/21. Despite increase in BB, pt has not converted to SR and remains in afib, rate controlled. She feels better in SR, she has fatigue. With   her being on tikosyn, we will try one more cardioversion but if this fails to keep her in rhythm, we will need to discuss stopping tikosyn and living in afib. She  has failed amiodarone in the past as well.   F/u in afib clinic, 07/19/21. She had a successful cardioversion but went back into afib by the next am. She did not feel any better but noted by her heart rates. She also thinks she may have taken an extra Tikosyn today. We discussed  stopping her Tikosyn but she feels she may go back in. I compromised and I will bring back in 2 weeks and if still out of rhythm will stop Tikosyn and she will live in rate controlled afib. She will not take her Tikosyn this pm for possible extra dose at noon.   Today, she denies symptoms of chest pain, shortness of breath, orthopnea, PND, lower extremity edema, dizziness, presyncope, syncope, or neurologic sequela.Perisistent cough. The patient is tolerating medications without difficulties and is otherwise without complaint today.   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   Allergy    SEASONAL   Anemia    years ago   Anticoagulated by anticoagulation treatment - Eliquis 03/30/2014   Started May 2015    Anxiety state 10/13/2010   Qualifier: Diagnosis of  By: Kowalk CMA (AAMA), Leisha     Arthritis    "all my joints" (03/31/2014)   Barrett's esophagus    Breast cancer (HCC)    s/p right breast lumpectomy   CARDIAC MURMUR 10/13/2010   Qualifier: Diagnosis of  By: Kowalk CMA (AAMA), Leisha     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   Cervical cancer (HCC)    Chronic cough 01/02/2017   GERD, hiatal hernia Doubt amio   COLONIC POLYPS, ADENOMATOUS, HX OF 10/13/2010   Qualifier: Diagnosis of  By: Kowalk CMA (AAMA), Leisha     Degenerative disorder of eye 07/25/2012   Diabetes mellitus without complication (HCC)    Dilated cardiomyopathy (HCC) 08/27/2018   DIVERTICULOSIS, COLON 10/13/2010   Qualifier: Diagnosis of  By: Kowalk CMA (AAMA), Leisha     DJD (degenerative joint disease) of knee    Dysrhythmia    Afib   Echocardiogram 07/2020    Echo 9/21: EF 70-75, moderate LVH, GR 1 DD, RVSP 40.5 (mildly elevated), normal RVSF, moderate LAE, mild RAE, mild MR, trivial AI   EROSIVE ESOPHAGITIS 10/13/2010   Qualifier: Diagnosis of  By: Kowalk CMA (AAMA), Leisha     Error, refractive, myopia 07/09/2012   Essential hypertension 10/13/2010   Qualifier: Diagnosis of  By: Kowalk CMA (AAMA), Leisha     Exotropia, intermittent 07/09/2012   GERD 10/14/2010   Qualifier: Diagnosis of  By: Patterson MD FACG, David R    GERD (gastroesophageal reflux disease)    Glaucoma    H/O hiatal hernia    Heart murmur    Hiatal hernia s/p robotic repair & fundoplication 05/25/2017 10/13/2010   Qualifier: Diagnosis of  By: Kowalk CMA (AAMA), Leisha     History of laser assisted in situ keratomileusis 07/25/2012   Hx of transesophageal echocardiography (TEE) for monitoring    TEE (03/2014): No LAA clot, moderate LAE, core triatriatum type structure in RA with no stenosis, normal EF 55-60%, mild MR   HYPERCHOLESTEROLEMIA 10/13/2010   Qualifier: Diagnosis of    By: Nils Pyle CMA (AAMA), Leisha     Hypertension    Hypothyroidism, postsurgical    Iron Deficiency Anemia 10/14/2010   Qualifier: Diagnosis of  By: Sharlett Iles MD Cline Cools R    Irritable bowel syndrome 10/13/2010   Qualifier: Diagnosis of  By: Nils Pyle CMA (AAMA), Leisha     Lumbar stenosis with neurogenic claudication 04/20/2016   Malignant neoplasm of breast ([place]) 02/25/2016    Migraine    "last one was years ago" (03/31/2014)   MIGRAINE HEADACHE 10/13/2010   Qualifier: Diagnosis of  By: Nils Pyle CMA (AAMA), Leisha     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   Overview:  X(T)    OSA on CPAP    settings at 10-11    OSTEOARTHRITIS 10/13/2010   Qualifier: Diagnosis of  By: Nils Pyle CMA (AAMA), Leisha     Persistent atrial fibrillation 03/31/2015   DLCO dropped from 100% in 2015-50% in 01/2017   Post-surgical hypothyroidism 04/09/2014   RECTAL FISSURE 10/13/2010   Qualifier: Diagnosis of  By: Nils Pyle CMA (AAMA), Mearl Latin     S/P Nissen fundoplication (without gastrostomy tube) procedure 05/25/2017   Sleep apnea    THYROID CANCER 10/13/2010   Qualifier: Diagnosis of  By: Nils Pyle CMA Deborra Medina), Mearl Latin     Thyroid cancer [facility])    s/p thyroidectomy   Type II diabetes mellitus ([place])    type II    Past Surgical History:  Procedure Laterality Date   ABDOMINAL HYSTERECTOMY     "partial"   BREAST BIOPSY Right    BREAST LUMPECTOMY Right    CARDIOVERSION N/A 03/30/2014   Procedure: CARDIOVERSION - BEDSIDE;  Surgeon: [name], MD;  Location: [place];  Service: Cardiovascular;  Laterality: N/A;   CARDIOVERSION N/A 03/31/2014   Procedure: CARDIOVERSION  (BEDSIDE) ;  Surgeon: Lelon Perla, MD;  Location: [place];  Service: Cardiovascular;  Laterality: N/A;   CARDIOVERSION N/A 04/27/2014   Procedure: CARDIOVERSION;  Surgeon: Darlin Coco, MD;  Location: [place];  Service: Cardiovascular;  Laterality: N/A;   CARDIOVERSION N/A 04/13/2015   Procedure: CARDIOVERSION;  Surgeon: Thayer Headings, MD;  Location: [place];  Service: Cardiovascular;  Laterality: N/A;   CARDIOVERSION N/A 01/08/2017   Procedure: CARDIOVERSION;  Surgeon: Sanda Klein, MD;  Location: [facility] ENDOSCOPY;  Service: Cardiovascular;  Laterality: N/A;   CARDIOVERSION N/A 09/19/2017   Procedure: CARDIOVERSION;  Surgeon: [name], MD;  Location: [place];  Service: Cardiovascular;  Laterality: N/A;   CARDIOVERSION  N/A 10/25/2017   Procedure: CARDIOVERSION;  Surgeon: Nahser, Wonda Cheng, MD;  Location: [place];  Service: Cardiovascular;  Laterality: N/A;   CARDIOVERSION N/A 12/27/2018   Procedure: CARDIOVERSION;  Surgeon: [name], MD;  Location: [place];  Service: Cardiovascular;  Laterality: N/A;   CARDIOVERSION N/A 05/20/2019   Procedure: CARDIOVERSION;  Surgeon: [name], MD;  Location: [place];  Service: Cardiovascular;  Laterality: N/A;   CARDIOVERSION N/A 12/10/2019   Procedure: CARDIOVERSION;  Surgeon: Pixie Casino, MD;  Location: [place];  Service: Cardiovascular;  Laterality: N/A;   CARDIOVERSION N/A 12/31/2019   Procedure: CARDIOVERSION;  Surgeon: Sanda Klein, MD;  Location: [facility] ENDOSCOPY;  Service: Cardiovascular;  Laterality: N/A;   CARDIOVERSION N/A 10/21/2020   Procedure: CARDIOVERSION;  Surgeon: [name], MD;  Location: [place];  Service: Cardiovascular;  Laterality: N/A;   CARDIOVERSION N/A 12/03/2020   Procedure: CARDIOVERSION;  Surgeon: Jerline Pain, MD;  Location: [place];  Service: Cardiovascular;  Laterality: N/A;   CARDIOVERSION N/A 07/11/2021  Procedure: CARDIOVERSION;  Surgeon: Pixie Casino, MD;  Location: [facility] ENDOSCOPY;  Service: Cardiovascular;  Laterality: N/A;   CARPAL TUNNEL RELEASE Right    CATARACT EXTRACTION W/ INTRAOCULAR LENS  IMPLANT, BILATERAL Bilateral    COLONOSCOPY     EXCISIONAL HEMORRHOIDECTOMY     INSERTION OF MESH N/A 05/25/2017   Procedure: INSERTION OF MESH;  Surgeon: Ralene Ok, MD;  Location: [facility];  Service: General;  Laterality: N/A;   TEE WITHOUT CARDIOVERSION N/A 03/30/2014   Procedure: TRANSESOPHAGEAL ECHOCARDIOGRAM (TEE);  Surgeon: [name], MD;  Location: [facility] ENDOSCOPY;  Service: Cardiovascular;  Laterality: N/A;   THYROIDECTOMY     TONSILLECTOMY      Current Outpatient Medications  Medication Sig Dispense Refill   ACCU-CHEK GUIDE test strip      acetaminophen (TYLENOL)  650 MG CR tablet Take 650 mg by mouth 3 (three) times daily.     Blood Glucose Monitoring Suppl (ACCU-CHEK GUIDE ME) w/Device KIT      Calcium Citrate (CITRACAL PO) Take 800 mg by mouth daily.      cetirizine (ZYRTEC) 10 MG tablet Take 10 mg by mouth daily.     cholecalciferol (VITAMIN D) 25 MCG (1000 UNIT) tablet Take 1,000 Units by mouth daily.     diclofenac Sodium (VOLTAREN) 1 % GEL Apply 2 g topically daily as needed (Knee pain).     dofetilide (TIKOSYN) 125 MCG capsule Take 1 capsule (125 mcg total) by mouth 2 (two) times daily. 60 capsule 11   ELIQUIS 5 MG TABS tablet TAKE 1 TABLET TWICE DAILY 180 tablet 3   ferrous sulfate 325 (65 FE) MG tablet Take 325 mg by mouth daily with breakfast.     fluticasone (FLONASE) 50 MCG/ACT nasal spray Place 1 spray into both nostrils at bedtime as needed for allergies.      lansoprazole (PREVACID) 30 MG capsule Take 30 mg by mouth daily as needed (acid reflux).      levothyroxine (SYNTHROID) 112 MCG tablet Take 112 mcg by mouth at bedtime. Middle of night     LORazepam (ATIVAN) 1 MG tablet Take 1.5 mg by mouth at bedtime. Take additional 0.5 mg if needed     Lutein 6 MG CAPS Take 6 mg by mouth daily.      magnesium oxide (MAG-OX) 400 MG tablet TAKE ONE TABLET BY MOUTH TWICE A DAY 60 tablet 6   metFORMIN (GLUCOPHAGE-XR) 500 MG 24 hr tablet Take 500 mg by mouth 2 (two) times daily.     metoprolol tartrate (LOPRESSOR) 50 MG tablet Take 1 tablet (50 mg total) by mouth 2 (two) times daily. Take 2 tablets by mouth in the am and take two tablets in the evening (Patient taking differently: Take 2 tablets by mouth in the am and take two tablets in the evening) 180 tablet 3   Multiple Vitamins-Minerals (PRESERVISION AREDS 2) CAPS Take 1 capsule by mouth daily.     Polyvinyl Alcohol-Povidone PF 1.4-0.6 % SOLN Place 1 drop into both eyes daily as needed (dry eyes).      potassium chloride SA (KLOR-CON) 20 MEQ tablet Take 1 tablet (20 mEq total) by mouth 2 (two) times  daily.     rosuvastatin (CRESTOR) 10 MG tablet Take 10 mg by mouth at bedtime.     sertraline (ZOLOFT) 50 MG tablet Take 50 mg by mouth daily.     vitamin B-12 (CYANOCOBALAMIN) 500 MCG tablet Take 500 mcg by mouth daily.     zinc gluconate 50  MG tablet Take 50 mg by mouth daily.     No current facility-administered medications for this encounter.    Allergies  Allergen Reactions   Penicillins Itching, Rash and Other (See Comments)    Did it involve swelling of the face/tongue/throat, SOB, or low BP? No Did it involve sudden or severe rash/hives, skin peeling, or any reaction on the inside of your mouth or nose? No Did you need to seek medical attention at a hospital or doctor's office? No When did it last happen?    30+ years   If all above answers are "NO", may proceed with cephalosporin use.     Social History   Socioeconomic History   Marital status: Divorced    Spouse name: Not on file   Number of children: 2   Years of education: Not on file   Highest education level: Not on file  Occupational History   Occupation: Retired    Comment: Education officer, museum  Tobacco Use   Smoking status: Never   Smokeless tobacco: Never  Vaping Use   Vaping Use: Never used  Substance and Sexual Activity   Alcohol use: No    Alcohol/week: 0.0 standard drinks   Drug use: No   Sexual activity: Never  Other Topics Concern   Not on file  Social History Narrative   Not on file   Social Determinants of Health   Financial Resource Strain: Not on file  Food Insecurity: Not on file  Transportation Needs: Not on file  Physical Activity: Not on file  Stress: Not on file  Social Connections: Not on file  Intimate Partner Violence: Not on file    Family History  Problem Relation Age of Onset   Prostate cancer Father    Diabetes Sister    Heart disease Sister    Heart attack Brother    Heart disease Brother    Diabetes Brother    Diabetes Sister    Diabetes Sister    Pancreatic cancer  Sister    Diabetes Brother    Heart disease Brother    Diabetes Brother    Heart disease Brother    Diabetes Brother    Heart disease Brother    Diabetes Brother    Heart disease Brother    Diabetes Brother    Heart disease Brother    Diabetes Brother    Diabetes Child     ROS- All systems are reviewed and negative except as per the HPI above  Physical Exam: Vitals:   07/19/21 1319  BP: 116/68  Pulse: 92  Weight: 78.3 kg  Height: 5' 6" (1.676 m)   Wt Readings from Last 3 Encounters:  07/19/21 78.3 kg  07/11/21 77.9 kg  07/05/21 77.9 kg    Labs: Lab Results  Component Value Date   NA 139 07/05/2021   K 4.9 07/05/2021   CL 106 07/05/2021   CO2 27 07/05/2021   GLUCOSE 141 (H) 07/05/2021   BUN 21 07/05/2021   CREATININE 0.92 07/05/2021   CALCIUM 9.5 07/05/2021   MG 2.2 07/05/2021   Lab Results  Component Value Date   INR 1.2 10/21/2020   No results found for: CHOL, HDL, LDLCALC, TRIG   GEN- The patient is a well appearing elderly female, alert and oriented x 3 today.   HEENT-head normocephalic, atraumatic, sclera clear, conjunctiva pink, hearing intact, trachea midline. Lungs- Clear to ausculation bilaterally, normal work of breathing Heart- irregular rate and rhythm, no murmurs, rubs or gallops  GI-  soft, NT, ND, + BS Extremities- no clubbing, cyanosis, or edema MS- no significant deformity or atrophy Skin- no rash or lesion Psych- euthymic mood, full affect Neuro- strength and sensation are intact   EKG-  afib at 92 bpm, qrs int 96 ms, qtc 516 ms   Epic records reviewed   Assessment and Plan: 1. Persistent atrial fibrillation/atrial flutter Previous failure of amiodarone.  Loaded on dofetilide 10/2020  and now with failing Tikosyn Patient is in afib, recent cardioversion successful with SR in the 40's, but  lasted  less than one day Continue metoprolol 50 mg bid  Continue dofetilide 125 mcg BID, may have taken an extra dose of tikosyn at lunch,  do not take pm dose, resume in am   2. HTN Stable, no changes today.  3. CHA2DS2VASc score of 6 Eliquis 5 mg BID States no missed does for at least 3 weeks     Pt thinks she may go back  into rhythm and asks for me not to stop Tikosyn at this time I will see back in 2 weeks and if still out of rhythm will stop Tikosyn and she will live in rate controlled afib    Butch Penny C. Michio Thier, [facility] [address] [place], [place] [id number] [phone]

## 2021-07-25 DIAGNOSIS — E119 Type 2 diabetes mellitus without complications: Secondary | ICD-10-CM | POA: Diagnosis not present

## 2021-07-25 DIAGNOSIS — E785 Hyperlipidemia, unspecified: Secondary | ICD-10-CM | POA: Diagnosis not present

## 2021-07-25 DIAGNOSIS — I4891 Unspecified atrial fibrillation: Secondary | ICD-10-CM | POA: Diagnosis not present

## 2021-07-25 DIAGNOSIS — Z800 Family history of malignant neoplasm of digestive organs: Secondary | ICD-10-CM | POA: Diagnosis not present

## 2021-07-25 DIAGNOSIS — Z8542 Personal history of malignant neoplasm of other parts of uterus: Secondary | ICD-10-CM | POA: Diagnosis not present

## 2021-07-25 DIAGNOSIS — Z853 Personal history of malignant neoplasm of breast: Secondary | ICD-10-CM | POA: Diagnosis not present

## 2021-07-25 DIAGNOSIS — I509 Heart failure, unspecified: Secondary | ICD-10-CM | POA: Diagnosis not present

## 2021-07-25 DIAGNOSIS — I110 Hypertensive heart disease with heart failure: Secondary | ICD-10-CM | POA: Diagnosis not present

## 2021-07-25 DIAGNOSIS — D649 Anemia, unspecified: Secondary | ICD-10-CM | POA: Diagnosis not present

## 2021-07-25 DIAGNOSIS — F3341 Major depressive disorder, recurrent, in partial remission: Secondary | ICD-10-CM | POA: Diagnosis not present

## 2021-07-25 DIAGNOSIS — Z880 Allergy status to penicillin: Secondary | ICD-10-CM | POA: Diagnosis not present

## 2021-07-25 DIAGNOSIS — Z8249 Family history of ischemic heart disease and other diseases of the circulatory system: Secondary | ICD-10-CM | POA: Diagnosis not present

## 2021-07-25 DIAGNOSIS — D6869 Other thrombophilia: Secondary | ICD-10-CM | POA: Diagnosis not present

## 2021-07-25 DIAGNOSIS — Z85850 Personal history of malignant neoplasm of thyroid: Secondary | ICD-10-CM | POA: Diagnosis not present

## 2021-07-25 DIAGNOSIS — Z7984 Long term (current) use of oral hypoglycemic drugs: Secondary | ICD-10-CM | POA: Diagnosis not present

## 2021-07-25 DIAGNOSIS — Z882 Allergy status to sulfonamides status: Secondary | ICD-10-CM | POA: Diagnosis not present

## 2021-07-25 DIAGNOSIS — E663 Overweight: Secondary | ICD-10-CM | POA: Diagnosis not present

## 2021-08-04 ENCOUNTER — Ambulatory Visit (HOSPITAL_COMMUNITY)
Admission: RE | Admit: 2021-08-04 | Discharge: 2021-08-04 | Disposition: A | Discharge status: Home / Self Care | Payer: Medicare PPO | Source: Ambulatory Visit | Attending: Nurse Practitioner | Admitting: Nurse Practitioner

## 2021-08-04 ENCOUNTER — Encounter (HOSPITAL_COMMUNITY): Payer: Self-pay | Admitting: Nurse Practitioner

## 2021-08-04 ENCOUNTER — Other Ambulatory Visit: Payer: Self-pay

## 2021-08-04 VITALS — BP 146/86 | HR 97 | Ht 66.0 in | Wt 175.0 lb

## 2021-08-04 DIAGNOSIS — Z79899 Other long term (current) drug therapy: Secondary | ICD-10-CM | POA: Diagnosis not present

## 2021-08-04 DIAGNOSIS — Z7901 Long term (current) use of anticoagulants: Secondary | ICD-10-CM | POA: Insufficient documentation

## 2021-08-04 DIAGNOSIS — I4819 Other persistent atrial fibrillation: Secondary | ICD-10-CM | POA: Insufficient documentation

## 2021-08-04 DIAGNOSIS — Z8249 Family history of ischemic heart disease and other diseases of the circulatory system: Secondary | ICD-10-CM | POA: Diagnosis not present

## 2021-08-04 DIAGNOSIS — D6869 Other thrombophilia: Secondary | ICD-10-CM

## 2021-08-04 DIAGNOSIS — I4892 Unspecified atrial flutter: Secondary | ICD-10-CM | POA: Insufficient documentation

## 2021-08-04 DIAGNOSIS — Z880 Allergy status to penicillin: Secondary | ICD-10-CM | POA: Diagnosis not present

## 2021-08-04 DIAGNOSIS — I10 Essential (primary) hypertension: Secondary | ICD-10-CM | POA: Insufficient documentation

## 2021-08-04 NOTE — Progress Notes (Signed)
Primary Care Physician: Lavone Orn, MD Cardiologist: [name] Pulmonologist: Dr. Elsworth Soho GI: Dr. Dolphus Jenny [name] is a 87 y.o. female with a h/o afib that has been on amiodarone since June of 2015. She has had many breakthrough events, last one in February with successful cardioversion. She does feel very weak in afib. She went into afib around 2 weeks ago and a RN came out to her house on Friday and documented the afib.  Dr. Rayann Heman was called and left instructions to increase amio to 200 mg bid and stop losartan. Apparently her BP was low on that day.  She is in now in the afib clinc and she is rate controlled and BP is stable. She reports around 2-3 weeks ago she first had 2 teeth pulled and then a shot of cortisone in her knee. She hasn't felt well since. She usually requires a cardioversion to restore SR.    F/u in afib clinic 05/28/19 after successful cardioversion. She is in SR today and feels improved. She is back to Amio 200 mg daily.  F/u in afib clinic, 11/25/19, with feelings of not being well and being out of rhythm x one week. She has had an UTI and just finished a round of doxycycline. EKG shows atrial flutter  variable rate at 110 bpm.CHA2DS2-VASc Score = 6, continues eliquis 5 mg bid. 1   F/u in  afib clinic, 12/16/18. She had a successful cardioversion but unfortunately felt like it only lasted one day. She is in afib in the 90's. Natasha will try to increase BB and keep amio  at 200 mg a little longer to see if she may return to [place].   F/u  in afib clinic, 01/15/20.  F/u cardioversion 2/17. She was kept on her higher dose of BB at time of cardioversion as she did covert out but with PAC's. She saw [name] one week out and was staying in [place]. She  was left on amiodarone 200 mg bid until f/u here. She called the office earlier in the week and c/o of feeling weak. EKG shows  Sinus brady at 48 bpm. Natasha feel her bradycardia is the reason for her weakness and will titrate meds down  today.   F/u 01/22/20. She remains  in SR  in the low 60's but does not feel that much better.  Back on amiodarone 200 mg daily from  BID and lower dose of BB. Marland Kitchen She had some GI distress yesterday and recently had issues with UTI. She  denies urgency or frequency today.  F/u in afib clinic, 4/1, as pt has gone back into afib. Discussed with her Natasha feel that amiodarone is no longer effective and Natasha think it is best to stop drug at this point and rate control. If qt shortens off 3 month amiodarone wash out, currently at 512 ms then tikosyn may be an option down the line. She has multiple cardioversions with significant ERAF.  F/u in afib clinic, 4/14. She is feeling better off amiodarone and she is rate controlled in the 80's but would like to see her a little slower so will increase BB today. We can consider Tikosyn after  3 month amiodarone wash out if qt interval shortens.   03/10/21- She actually is feeling better and had the enegy to do more around her house and some light exercise. She is upset today as she had some diarrhea this am and had an accident in the clinic. Natasha feel  this is why her BP is higher today as she usually  does not have high BP. She feels it may the the plate of food her neighbor gave her last night. She is rate controlled at home in the 70's and 80's.   F/u in afib clinic, 6/29. She remains in rate controlled afib and states that she feels very good. She is exercising without any significant shortness of breath and is rate controlled. No excessive fluid retention. Natasha feel that amiodarone may have been contributing to fatigue and she feels better off drug. She remains on eliquis 5 mg bid for a CHA2DS2VASc score of 6.  F/u in afib clinic, 08/04/20. She  remains in rate controlled afib and initially tells me that she is exercising and feels great, but then tells me she  feels bad and would love to get back in rhythm. QT corrected around 430 ms. She has now been off amiodarone x 5 months.    F/u in afib clinic, 10/19/20,  as pt wants to be admitted for Tikosyn initiation. She  states that she feels terrible in afib. She has been living afib for several months . Natasha cut back her BB yesterday as she is slow in SR when she returns to SR and she is  not quite as well rate controlled today. She has been on 50 mg metoprolol bid with nice rate control.   She failed amiodarone after taking for years  and it was stopped late Spring after multiple cardioversion that would only hold short term.  She  has been off drug for sufficient period of time to try Tikosyn Her qtc today is 480 ms, but in SR with amiodarone on board was 460 ms, EKg  in spring 2015 without amio was 439 ms.  No benadryl use.  No missed anticoagulation. Electrolytes ok for admission with crcl cal at 64 so she will qualify for 500 msc. Covid negative. + vaccines.  F/u in afib clinic, 10/29/20,  one week after hospital admit for Tikosyn. Her qt prolonged and she was discharged on 125 mcg bid. She  did require cardioversion. She states today she feels improved. Her qt is 500 ms. Natasha will need to recheck qt early next week.   F/u in afib clinic, 11/26/20, as she reported to the office last week that she went back into aifb. She states that she got stressed over her son falling and breaking his knee and a covid outbreak in the family but she was not exposed.  Ekg today shows atrial flutter with RVR with v rates in the 130's. Despite this pt feels well.  Will schedule cardioversion. She continues on Tikosyn 125 mcg bid. No missed eliquis with CHA2DS2VASc score of 6.   Follow up in the AF clinic 06/13/21. Patient reports that she went back into afib a few days after calling to cancel her DCCV in 01/2021. She states she has been in afib for months. She went to see her PCP on 7/28 and was told her heart rates were elevated. It is unclear how symptomatic she is with her afib.   F/u in afib clinic 06/30/21. She remains in afib with RVR. We discussed  cardioversion vrs further adjustment of her meds. She would prefer to increase BB again and if the does not improve things then pursue CV.   F/u 07/05/21. Despite increase in BB, pt has not converted to SR and remains in afib, rate controlled. She feels better in SR, she has fatigue. With   her being on tikosyn, we will try one more cardioversion but if this fails to keep her in rhythm, we will need to discuss stopping tikosyn and living in afib. She  has failed amiodarone in the past as well.   F/u in afib clinic, 07/19/21. She had a successful cardioversion but went back into afib by the next am. She did not feel any better but noted by her heart rates. She also thinks she may have taken an extra Tikosyn today. We discussed  stopping her Tikosyn but she feels she may go back in. Natasha compromised and Natasha will bring back in 2 weeks and if still out of rhythm will stop Tikosyn and she will live in rate controlled afib. She will not take her Tikosyn this pm for possible extra dose at noon.   F/u in afib clinic, 08/05/21. SHe remains  in afib, rate controlled . We discussed stopping tikosyn as it as well is not keeping her in rhythm. Last cardioversion with ERAF.   Today, she denies symptoms of chest pain, shortness of breath, orthopnea, PND, lower extremity edema, dizziness, presyncope, syncope, or neurologic sequela.Perisistent cough. The patient is tolerating medications without difficulties and is otherwise without complaint today.   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   Allergy    SEASONAL   Anemia    years ago   Anticoagulated by anticoagulation treatment - Eliquis 03/30/2014   Started May 2015    Anxiety state 10/13/2010   Qualifier: Diagnosis of  By: Nils Pyle CMA (AAMA), Leisha     Arthritis    "all my joints" (03/31/2014)    Barrett's esophagus    Breast cancer ([place])    s/p right breast lumpectomy   CARDIAC MURMUR 10/13/2010   Qualifier: Diagnosis of  By: Nils Pyle CMA (AAMA), Leisha     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   Cervical cancer ([place])    Chronic cough 01/02/2017   GERD, hiatal hernia Doubt amio   COLONIC POLYPS, ADENOMATOUS, HX OF 10/13/2010   Qualifier: Diagnosis of  By: Nils Pyle CMA (AAMA), Leisha     Degenerative disorder of eye 07/25/2012   Diabetes mellitus without complication ([place])    Dilated cardiomyopathy ([place]) 08/27/2018   DIVERTICULOSIS, COLON 10/13/2010   Qualifier: Diagnosis of  By: Nils Pyle CMA (AAMA), Leisha     DJD (degenerative joint disease) of knee    Dysrhythmia    Afib   Echocardiogram 07/2020    Echo 9/21: EF 70-75, moderate LVH, GR 1 DD, RVSP 40.5 (mildly elevated), normal RVSF, moderate LAE, mild RAE, mild MR, trivial AI   EROSIVE ESOPHAGITIS 10/13/2010   Qualifier: Diagnosis of  By: Nils Pyle CMA (AAMA), Leisha     Error, refractive, myopia 07/09/2012   Essential hypertension 10/13/2010   Qualifier: Diagnosis of  By: Nils Pyle CMA (AAMA), Rueben Bash, intermittent 07/09/2012   GERD 10/14/2010   Qualifier: Diagnosis of  By: Sharlett Iles MD Cline Cools R    GERD (gastroesophageal reflux disease)    Glaucoma    H/O hiatal hernia    Heart murmur    Hiatal hernia s/p robotic repair & fundoplication 2/75/1700 17/02/9448   Qualifier: Diagnosis of  By: Nils Pyle CMA (AAMA), Mearl Latin     History of laser assisted in situ keratomileusis 07/25/2012   Hx of transesophageal echocardiography (TEE) for monitoring  TEE (03/2014): No LAA clot, moderate LAE, core triatriatum type structure in RA with no stenosis, normal EF 55-60%, mild MR   HYPERCHOLESTEROLEMIA 10/13/2010   Qualifier: Diagnosis of  By: Nils Pyle CMA (AAMA), Leisha     Hypertension    Hypothyroidism, postsurgical    Iron Deficiency Anemia 10/14/2010   Qualifier: Diagnosis of  By: Sharlett Iles MD Cline Cools R    Irritable bowel syndrome  10/13/2010   Qualifier: Diagnosis of  By: Nils Pyle CMA (AAMA), Leisha     Lumbar stenosis with neurogenic claudication 04/20/2016   Malignant neoplasm of breast ([place]) 02/25/2016   Migraine    "last one was years ago" (03/31/2014)   MIGRAINE HEADACHE 10/13/2010   Qualifier: Diagnosis of  By: Nils Pyle CMA (AAMA), Leisha     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   Overview:  X(T)    OSA on CPAP    settings at 10-11    OSTEOARTHRITIS 10/13/2010   Qualifier: Diagnosis of  By: Nils Pyle CMA (AAMA), Leisha     Persistent atrial fibrillation 03/31/2015   DLCO dropped from 100% in 2015-50% in 01/2017   Post-surgical hypothyroidism 04/09/2014   RECTAL FISSURE 10/13/2010   Qualifier: Diagnosis of  By: Nils Pyle CMA (AAMA), Mearl Latin     S/P Nissen fundoplication (without gastrostomy tube) procedure 05/25/2017   Sleep apnea    THYROID CANCER 10/13/2010   Qualifier: Diagnosis of  By: Nils Pyle CMA Deborra Medina), Mearl Latin     Thyroid cancer [facility])    s/p thyroidectomy   Type II diabetes mellitus ([place])    type II    Past Surgical History:  Procedure Laterality Date   ABDOMINAL HYSTERECTOMY     "partial"   BREAST BIOPSY Right    BREAST LUMPECTOMY Right    CARDIOVERSION N/A 03/30/2014   Procedure: CARDIOVERSION - BEDSIDE;  Surgeon: [name], MD;  Location: [place];  Service: Cardiovascular;  Laterality: N/A;   CARDIOVERSION N/A 03/31/2014   Procedure: CARDIOVERSION  (BEDSIDE) ;  Surgeon: Lelon Perla, MD;  Location: [place];  Service: Cardiovascular;  Laterality: N/A;   CARDIOVERSION N/A 04/27/2014   Procedure: CARDIOVERSION;  Surgeon: Darlin Coco, MD;  Location: [place];  Service: Cardiovascular;  Laterality: N/A;   CARDIOVERSION N/A 04/13/2015   Procedure: CARDIOVERSION;  Surgeon: Thayer Headings, MD;  Location: [place];  Service: Cardiovascular;  Laterality: N/A;   CARDIOVERSION N/A 01/08/2017   Procedure: CARDIOVERSION;  Surgeon: Sanda Klein, MD;  Location: [facility] ENDOSCOPY;  Service: Cardiovascular;  Laterality:  N/A;   CARDIOVERSION N/A 09/19/2017   Procedure: CARDIOVERSION;  Surgeon: [name], MD;  Location: [place];  Service: Cardiovascular;  Laterality: N/A;   CARDIOVERSION N/A 10/25/2017   Procedure: CARDIOVERSION;  Surgeon: Nahser, Wonda Cheng, MD;  Location: [place];  Service: Cardiovascular;  Laterality: N/A;   CARDIOVERSION N/A 12/27/2018   Procedure: CARDIOVERSION;  Surgeon: [name], MD;  Location: [place];  Service: Cardiovascular;  Laterality: N/A;   CARDIOVERSION N/A 05/20/2019   Procedure: CARDIOVERSION;  Surgeon: [name], MD;  Location: [facility] ENDOSCOPY;  Service: Cardiovascular;  Laterality: N/A;   CARDIOVERSION N/A 12/10/2019   Procedure: CARDIOVERSION;  Surgeon: Pixie Casino, MD;  Location: [place];  Service: Cardiovascular;  Laterality: N/A;   CARDIOVERSION N/A 12/31/2019   Procedure: CARDIOVERSION;  Surgeon: Sanda Klein, MD;  Location: [facility] ENDOSCOPY;  Service: Cardiovascular;  Laterality: N/A;   CARDIOVERSION N/A 10/21/2020   Procedure: CARDIOVERSION;  Surgeon: [name], MD;  Location: [place];  Service: Cardiovascular;  Laterality: N/A;  CARDIOVERSION N/A 12/03/2020   Procedure: CARDIOVERSION;  Surgeon: Jerline Pain, MD;  Location: [place];  Service: Cardiovascular;  Laterality: N/A;   CARDIOVERSION N/A 07/11/2021   Procedure: CARDIOVERSION;  Surgeon: Pixie Casino, MD;  Location: [facility] ENDOSCOPY;  Service: Cardiovascular;  Laterality: N/A;   CARPAL TUNNEL RELEASE Right    CATARACT EXTRACTION W/ INTRAOCULAR LENS  IMPLANT, BILATERAL Bilateral    COLONOSCOPY     EXCISIONAL HEMORRHOIDECTOMY     INSERTION OF MESH N/A 05/25/2017   Procedure: INSERTION OF MESH;  Surgeon: Ralene Ok, MD;  Location: [facility];  Service: General;  Laterality: N/A;   TEE WITHOUT CARDIOVERSION N/A 03/30/2014   Procedure: TRANSESOPHAGEAL ECHOCARDIOGRAM (TEE);  Surgeon: [name], MD;  Location: [facility] ENDOSCOPY;  Service: Cardiovascular;   Laterality: N/A;   THYROIDECTOMY     TONSILLECTOMY      Current Outpatient Medications  Medication Sig Dispense Refill   ACCU-CHEK GUIDE test strip      acetaminophen (TYLENOL) 650 MG CR tablet Take 650 mg by mouth 3 (three) times daily.     Blood Glucose Monitoring Suppl (ACCU-CHEK GUIDE ME) w/Device KIT      Calcium Citrate (CITRACAL PO) Take 800 mg by mouth daily.      cetirizine (ZYRTEC) 10 MG tablet Take 10 mg by mouth daily.     cholecalciferol (VITAMIN D) 25 MCG (1000 UNIT) tablet Take 1,000 Units by mouth daily.     diclofenac Sodium (VOLTAREN) 1 % GEL Apply 2 g topically daily as needed (Knee pain).     ELIQUIS 5 MG TABS tablet TAKE 1 TABLET TWICE DAILY 180 tablet 3   ferrous sulfate 325 (65 FE) MG tablet Take 325 mg by mouth daily with breakfast.     fluticasone (FLONASE) 50 MCG/ACT nasal spray Place 1 spray into both nostrils at bedtime as needed for allergies.      lansoprazole (PREVACID) 30 MG capsule Take 30 mg by mouth daily as needed (acid reflux).      levothyroxine (SYNTHROID) 112 MCG tablet Take 112 mcg by mouth at bedtime. Middle of night     LORazepam (ATIVAN) 1 MG tablet Take 1.5 mg by mouth at bedtime. Take additional 0.5 mg if needed     Lutein 6 MG CAPS Take 6 mg by mouth daily.      metFORMIN (GLUCOPHAGE-XR) 500 MG 24 hr tablet Take 500 mg by mouth 2 (two) times daily.     metoprolol tartrate (LOPRESSOR) 50 MG tablet Take 1 tablet (50 mg total) by mouth 2 (two) times daily. Take 2 tablets by mouth in the am and take two tablets in the evening (Patient taking differently: Take 2 tablets by mouth in the am and take two tablets in the evening) 180 tablet 3   Multiple Vitamins-Minerals (PRESERVISION AREDS 2) CAPS Take 1 capsule by mouth daily.     Polyvinyl Alcohol-Povidone PF 1.4-0.6 % SOLN Place 1 drop into both eyes daily as needed (dry eyes).      rosuvastatin (CRESTOR) 10 MG tablet Take 10 mg by mouth at bedtime.     sertraline (ZOLOFT) 50 MG tablet Take 50 mg by  mouth daily.     vitamin B-12 (CYANOCOBALAMIN) 500 MCG tablet Take 500 mcg by mouth daily.     zinc gluconate 50 MG tablet Take 50 mg by mouth daily.     No current facility-administered medications for this encounter.    Allergies  Allergen Reactions   Penicillins Itching, Rash and Other (See  Comments)    Did it involve swelling of the face/tongue/throat, SOB, or low BP? No Did it involve sudden or severe rash/hives, skin peeling, or any reaction on the inside of your mouth or nose? No Did you need to seek medical attention at a hospital or doctor's office? No When did it last happen?    30+ years   If all above answers are "NO", may proceed with cephalosporin use.     Social History   Socioeconomic History   Marital status: Divorced    Spouse name: Not on file   Number of children: 2   Years of education: Not on file   Highest education level: Not on file  Occupational History   Occupation: Retired    Comment: Education officer, museum  Tobacco Use   Smoking status: Never   Smokeless tobacco: Never  Vaping Use   Vaping Use: Never used  Substance and Sexual Activity   Alcohol use: No    Alcohol/week: 0.0 standard drinks   Drug use: No   Sexual activity: Never  Other Topics Concern   Not on file  Social History Narrative   Not on file   Social Determinants of Health   Financial Resource Strain: Not on file  Food Insecurity: Not on file  Transportation Needs: Not on file  Physical Activity: Not on file  Stress: Not on file  Social Connections: Not on file  Intimate Partner Violence: Not on file    Family History  Problem Relation Age of Onset   Prostate cancer Father    Diabetes Sister    Heart disease Sister    Heart attack Brother    Heart disease Brother    Diabetes Brother    Diabetes Sister    Diabetes Sister    Pancreatic cancer Sister    Diabetes Brother    Heart disease Brother    Diabetes Brother    Heart disease Brother    Diabetes Brother    Heart  disease Brother    Diabetes Brother    Heart disease Brother    Diabetes Brother    Heart disease Brother    Diabetes Brother    Diabetes Child     ROS- All systems are reviewed and negative except as per the HPI above  Physical Exam: Vitals:   08/04/21 1403  BP: (!) 146/86  Pulse: 97  Weight: 79.4 kg  Height: $Remove'5\' 6"'NsTBXis$  (1.676 m)   Wt Readings from Last 3 Encounters:  08/04/21 79.4 kg  07/19/21 78.3 kg  07/11/21 77.9 kg    Labs: Lab Results  Component Value Date   NA 139 07/05/2021   K 4.9 07/05/2021   CL 106 07/05/2021   CO2 27 07/05/2021   GLUCOSE 141 (H) 07/05/2021   BUN 21 07/05/2021   CREATININE 0.92 07/05/2021   CALCIUM 9.5 07/05/2021   MG 2.2 07/05/2021   Lab Results  Component Value Date   INR 1.2 10/21/2020   No results found for: CHOL, HDL, LDLCALC, TRIG   GEN- The patient is a well appearing elderly female, alert and oriented x 3 today.   HEENT-head normocephalic, atraumatic, sclera clear, conjunctiva pink, hearing intact, trachea midline. Lungs- Clear to ausculation bilaterally, normal work of breathing Heart- irregular rate and rhythm, no murmurs, rubs or gallops  GI- soft, NT, ND, + BS Extremities- no clubbing, cyanosis, or edema MS- no significant deformity or atrophy Skin- no rash or lesion Psych- euthymic mood, full affect Neuro- strength and sensation are intact  EKG-  afib at 97 bpm, qrs int 90 ms, qtc 485 ms   Epic records reviewed   Assessment and Plan: 1. Persistent atrial fibrillation/atrial flutter Previous failure of amiodarone.  Loaded on dofetilide 10/2020  and now with failing Tikosyn Patient is in afib, recent cardioversion successful with SR in the 40's, but  lasted  less than one day Continue metoprolol 100 mg bid  We discussed stopping Tikosyn as it is not keeping her in SR and she is in agreement  She can also stop K+ and mag   2. HTN Stable, no changes today.  3. CHA2DS2VASc score of 6 Eliquis 5 mg BID    F/u  in 3 weeks    Butch Penny C. Rhyland Hinderliter, [facility] [address] [place], [place] [address] [phone]

## 2021-08-04 NOTE — Patient Instructions (Signed)
STOP tikosyn (dofetilide)  Stop magnesium  Stop Klor-Con (potassium)

## 2021-08-25 ENCOUNTER — Encounter (HOSPITAL_COMMUNITY): Payer: Self-pay | Admitting: Nurse Practitioner

## 2021-08-25 ENCOUNTER — Other Ambulatory Visit: Payer: Self-pay

## 2021-08-25 ENCOUNTER — Ambulatory Visit (HOSPITAL_COMMUNITY)
Admission: RE | Admit: 2021-08-25 | Discharge: 2021-08-25 | Disposition: A | Discharge status: Home / Self Care | Payer: Medicare PPO | Source: Ambulatory Visit | Attending: Nurse Practitioner | Admitting: Nurse Practitioner

## 2021-08-25 VITALS — BP 136/88 | HR 93 | Ht 66.0 in | Wt 169.8 lb

## 2021-08-25 DIAGNOSIS — Z7901 Long term (current) use of anticoagulants: Secondary | ICD-10-CM | POA: Insufficient documentation

## 2021-08-25 DIAGNOSIS — I10 Essential (primary) hypertension: Secondary | ICD-10-CM | POA: Diagnosis not present

## 2021-08-25 DIAGNOSIS — I4819 Other persistent atrial fibrillation: Secondary | ICD-10-CM | POA: Diagnosis not present

## 2021-08-25 DIAGNOSIS — I4892 Unspecified atrial flutter: Secondary | ICD-10-CM | POA: Insufficient documentation

## 2021-08-25 DIAGNOSIS — D6869 Other thrombophilia: Secondary | ICD-10-CM | POA: Diagnosis not present

## 2021-08-25 DIAGNOSIS — R197 Diarrhea, unspecified: Secondary | ICD-10-CM

## 2021-08-25 DIAGNOSIS — Z8249 Family history of ischemic heart disease and other diseases of the circulatory system: Secondary | ICD-10-CM | POA: Diagnosis not present

## 2021-08-25 DIAGNOSIS — Z79899 Other long term (current) drug therapy: Secondary | ICD-10-CM | POA: Insufficient documentation

## 2021-08-25 DIAGNOSIS — Z7984 Long term (current) use of oral hypoglycemic drugs: Secondary | ICD-10-CM | POA: Insufficient documentation

## 2021-08-25 LAB — BASIC METABOLIC PANEL
Anion gap: 8 (ref 5–15)
BUN: 17 mg/dL (ref 8–23)
CO2: 27 mmol/L (ref 22–32)
Calcium: 9.3 mg/dL (ref 8.9–10.3)
Chloride: 107 mmol/L (ref 98–111)
Creatinine, Ser: 0.64 mg/dL (ref 0.44–1.00)
GFR, Estimated: >60 mL/min (ref >60)
Glucose, Bld: 132 mg/dL — ABNORMAL HIGH (ref 70–99)
Potassium: 3.6 mmol/L (ref 3.5–5.1)
Sodium: 142 mmol/L (ref 135–145)

## 2021-08-25 LAB — TSH: TSH: 0.833 u[IU]/mL (ref 0.350–4.500)

## 2021-08-25 LAB — MAGNESIUM: Magnesium: 1.7 mg/dL (ref 1.7–2.4)

## 2021-08-25 MED ORDER — METOPROLOL TARTRATE 50 MG PO TABS: ORAL_TABLET | ORAL | Status: DC

## 2021-08-25 NOTE — Progress Notes (Signed)
Primary Care Physician: Lavone Orn, MD Cardiologist: [name] Pulmonologist: Dr. Elsworth Soho GI: Dr. Dolphus Jenny [name] is a 87 y.o. female with a h/o afib that has been on amiodarone since June of 2015. She has had many breakthrough events, last one in February with successful cardioversion. She does feel very weak in afib. She went into afib around 2 weeks ago and a RN came out to her house on Friday and documented the afib.  Dr. Rayann Heman was called and left instructions to increase amio to 200 mg bid and stop losartan. Apparently her BP was low on that day.  She is in now in the afib clinc and she is rate controlled and BP is stable. She reports around 2-3 weeks ago she first had 2 teeth pulled and then a shot of cortisone in her knee. She hasn't felt well since. She usually requires a cardioversion to restore SR.    F/u in afib clinic 05/28/19 after successful cardioversion. She is in SR today and feels improved. She is back to Amio 200 mg daily.  F/u in afib clinic, 11/25/19, with feelings of not being well and being out of rhythm x one week. She has had an UTI and just finished a round of doxycycline. EKG shows atrial flutter  variable rate at 110 bpm.CHA2DS2-VASc Score = 6, continues eliquis 5 mg bid. 1   F/u in  afib clinic, 12/16/18. She had a successful cardioversion but unfortunately felt like it only lasted one day. She is in afib in the 90's. Natasha will try to increase BB and keep amio  at 200 mg a little longer to see if she may return to [place].   F/u  in afib clinic, 01/15/20.  F/u cardioversion 2/17. She was kept on her higher dose of BB at time of cardioversion as she did covert out but with PAC's. She saw [name] one week out and was staying in [place]. She  was left on amiodarone 200 mg bid until f/u here. She called the office earlier in the week and c/o of feeling weak. EKG shows  Sinus brady at 48 bpm. Natasha feel her bradycardia is the reason for her weakness and will titrate meds down  today.   F/u 01/22/20. She remains  in SR  in the low 60's but does not feel that much better.  Back on amiodarone 200 mg daily from  BID and lower dose of BB. Marland Kitchen She had some GI distress yesterday and recently had issues with UTI. She  denies urgency or frequency today.  F/u in afib clinic, 4/1, as pt has gone back into afib. Discussed with her Natasha feel that amiodarone is no longer effective and Natasha think it is best to stop drug at this point and rate control. If qt shortens off 3 month amiodarone wash out, currently at 512 ms then tikosyn may be an option down the line. She has multiple cardioversions with significant ERAF.  F/u in afib clinic, 4/14. She is feeling better off amiodarone and she is rate controlled in the 80's but would like to see her a little slower so will increase BB today. We can consider Tikosyn after  3 month amiodarone wash out if qt interval shortens.   03/10/21- She actually is feeling better and had the enegy to do more around her house and some light exercise. She is upset today as she had some diarrhea this am and had an accident in the clinic. Natasha feel  this is why her BP is higher today as she usually  does not have high BP. She feels it may the the plate of food her neighbor gave her last night. She is rate controlled at home in the 70's and 80's.   F/u in afib clinic, 6/29. She remains in rate controlled afib and states that she feels very good. She is exercising without any significant shortness of breath and is rate controlled. No excessive fluid retention. Natasha feel that amiodarone may have been contributing to fatigue and she feels better off drug. She remains on eliquis 5 mg bid for a CHA2DS2VASc score of 6.  F/u in afib clinic, 08/04/20. She  remains in rate controlled afib and initially tells me that she is exercising and feels great, but then tells me she  feels bad and would love to get back in rhythm. QT corrected around 430 ms. She has now been off amiodarone x 5 months.    F/u in afib clinic, 10/19/20,  as pt wants to be admitted for Tikosyn initiation. She  states that she feels terrible in afib. She has been living afib for several months . Natasha cut back her BB yesterday as she is slow in SR when she returns to SR and she is  not quite as well rate controlled today. She has been on 50 mg metoprolol bid with nice rate control.   She failed amiodarone after taking for years  and it was stopped late Spring after multiple cardioversion that would only hold short term.  She  has been off drug for sufficient period of time to try Tikosyn Her qtc today is 480 ms, but in SR with amiodarone on board was 460 ms, EKg  in spring 2015 without amio was 439 ms.  No benadryl use.  No missed anticoagulation. Electrolytes ok for admission with crcl cal at 64 so she will qualify for 500 msc. Covid negative. + vaccines.  F/u in afib clinic, 10/29/20,  one week after hospital admit for Tikosyn. Her qt prolonged and she was discharged on 125 mcg bid. She  did require cardioversion. She states today she feels improved. Her qt is 500 ms. Natasha will need to recheck qt early next week.   F/u in afib clinic, 11/26/20, as she reported to the office last week that she went back into aifb. She states that she got stressed over her son falling and breaking his knee and a covid outbreak in the family but she was not exposed.  Ekg today shows atrial flutter with RVR with v rates in the 130's. Despite this pt feels well.  Will schedule cardioversion. She continues on Tikosyn 125 mcg bid. No missed eliquis with CHA2DS2VASc score of 6.   Follow up in the AF clinic 06/13/21. Patient reports that she went back into afib a few days after calling to cancel her DCCV in 01/2021. She states she has been in afib for months. She went to see her PCP on 7/28 and was told her heart rates were elevated. It is unclear how symptomatic she is with her afib.   F/u in afib clinic 06/30/21. She remains in afib with RVR. We discussed  cardioversion vrs further adjustment of her meds. She would prefer to increase BB again and if the does not improve things then pursue CV.   F/u 07/05/21. Despite increase in BB, pt has not converted to SR and remains in afib, rate controlled. She feels better in SR, she has fatigue. With   her being on tikosyn, we will try one more cardioversion but if this fails to keep her in rhythm, we will need to discuss stopping tikosyn and living in afib. She  has failed amiodarone in the past as well.   F/u in afib clinic, 07/19/21. She had a successful cardioversion but went back into afib by the next am. She did not feel any better but noted by her heart rates. She also thinks she may have taken an extra Tikosyn today. We discussed  stopping her Tikosyn but she feels she may go back in. Natasha compromised and Natasha will bring back in 2 weeks and if still out of rhythm will stop Tikosyn and she will live in rate controlled afib. She will not take her Tikosyn this pm for possible extra dose at noon.   F/u in afib clinic, 08/05/21. SHe remains  in afib, rate controlled . We discussed stopping tikosyn as it as well is not keeping her in rhythm. Last cardioversion with ERAF.   F/u in afib clinic, 08/25/21. She remains in rate controlled afib. She is off [place]. Her biggest concern is that she has had around 5 watery stools a day for the last 2 weeks with bowel incontinence. A friend of hers told her to try metamucil and eat apples with the skin and this is the first day she had a formed stool.   Her weight is down several pounds.   Today, she denies symptoms of chest pain, shortness of breath, orthopnea, PND, lower extremity edema, dizziness, presyncope, syncope, or neurologic sequela.  The patient is tolerating medications without difficulties and is otherwise without complaint today.   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   Allergy    SEASONAL   Anemia    years ago   Anticoagulated by anticoagulation treatment - Eliquis 03/30/2014   Started May 2015    Anxiety state 10/13/2010   Qualifier: Diagnosis of  By: Nils Pyle CMA (AAMA), Leisha     Arthritis    "all my joints" (03/31/2014)   Barrett's esophagus    Breast cancer ([place])    s/p right breast lumpectomy   CARDIAC MURMUR 10/13/2010   Qualifier: Diagnosis of  By: Nils Pyle CMA (AAMA), Leisha     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   Cervical cancer ([place])    Chronic cough 01/02/2017   GERD, hiatal hernia Doubt amio   COLONIC POLYPS, ADENOMATOUS, HX OF 10/13/2010   Qualifier: Diagnosis of  By: Nils Pyle CMA (AAMA), Leisha     Degenerative disorder of eye 07/25/2012   Diabetes mellitus without complication ([place])    Dilated cardiomyopathy ([place]) 08/27/2018   DIVERTICULOSIS, COLON 10/13/2010   Qualifier: Diagnosis of  By: Nils Pyle CMA (AAMA), Leisha     DJD (degenerative joint disease) of knee    Dysrhythmia    Afib   Echocardiogram 07/2020    Echo 9/21: EF 70-75, moderate LVH, GR 1 DD, RVSP 40.5 (mildly elevated), normal RVSF, moderate LAE, mild RAE, mild MR, trivial AI   EROSIVE ESOPHAGITIS 10/13/2010   Qualifier: Diagnosis of  By: Nils Pyle CMA (AAMA), Leisha     Error, refractive, myopia 07/09/2012   Essential hypertension 10/13/2010   Qualifier: Diagnosis of  By: Nils Pyle CMA (AAMA), Rueben Bash, intermittent 07/09/2012   GERD 10/14/2010   Qualifier: Diagnosis of  By: Sharlett Iles MD Byrd Hesselbach    GERD (gastroesophageal reflux disease)    Glaucoma    H/O hiatal hernia    Heart murmur    Hiatal hernia s/p robotic repair & fundoplication 9/83/3825 03/15/9766   Qualifier: Diagnosis of  By: Nils Pyle CMA (AAMA), Mearl Latin     History of laser assisted in situ keratomileusis 07/25/2012   Hx of transesophageal echocardiography (TEE) for monitoring    TEE (03/2014): No LAA clot, moderate LAE, core  triatriatum type structure in RA with no stenosis, normal EF 55-60%, mild MR   HYPERCHOLESTEROLEMIA 10/13/2010   Qualifier: Diagnosis of  By: Nils Pyle CMA (AAMA), Leisha     Hypertension    Hypothyroidism, postsurgical    Iron Deficiency Anemia 10/14/2010   Qualifier: Diagnosis of  By: Sharlett Iles MD Cline Cools R    Irritable bowel syndrome 10/13/2010   Qualifier: Diagnosis of  By: Nils Pyle CMA (AAMA), Leisha     Lumbar stenosis with neurogenic claudication 04/20/2016   Malignant neoplasm of breast ([place]) 02/25/2016   Migraine    "last one was years ago" (03/31/2014)   MIGRAINE HEADACHE 10/13/2010   Qualifier: Diagnosis of  By: Nils Pyle CMA (AAMA), Leisha     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   Overview:  X(T)    OSA on CPAP    settings at 10-11    OSTEOARTHRITIS 10/13/2010   Qualifier: Diagnosis of  By: Nils Pyle CMA (AAMA), Leisha     Persistent atrial fibrillation 03/31/2015   DLCO dropped from 100% in 2015-50% in 01/2017   Post-surgical hypothyroidism 04/09/2014   RECTAL FISSURE 10/13/2010   Qualifier: Diagnosis of  By: Nils Pyle CMA (AAMA), Mearl Latin     S/P Nissen fundoplication (without gastrostomy tube) procedure 05/25/2017   Sleep apnea    THYROID CANCER 10/13/2010   Qualifier: Diagnosis of  By: Nils Pyle CMA Deborra Medina), Mearl Latin     Thyroid cancer [facility])    s/p thyroidectomy   Type II diabetes mellitus ([place])    type II    Past Surgical History:  Procedure Laterality Date   ABDOMINAL HYSTERECTOMY     "partial"   BREAST BIOPSY Right    BREAST LUMPECTOMY Right    CARDIOVERSION N/A 03/30/2014   Procedure: CARDIOVERSION - BEDSIDE;  Surgeon: [name], MD;  Location: [place];  Service: Cardiovascular;  Laterality: N/A;   CARDIOVERSION N/A 03/31/2014   Procedure: CARDIOVERSION  (BEDSIDE) ;  Surgeon: Lelon Perla, MD;  Location: [place];  Service: Cardiovascular;  Laterality: N/A;   CARDIOVERSION N/A 04/27/2014   Procedure: CARDIOVERSION;  Surgeon: Darlin Coco, MD;  Location: [place];  Service:  Cardiovascular;  Laterality: N/A;   CARDIOVERSION N/A 04/13/2015   Procedure: CARDIOVERSION;  Surgeon: Thayer Headings, MD;  Location: [place];  Service: Cardiovascular;  Laterality: N/A;   CARDIOVERSION N/A 01/08/2017   Procedure: CARDIOVERSION;  Surgeon: Sanda Klein, MD;  Location: [facility] ENDOSCOPY;  Service: Cardiovascular;  Laterality: N/A;   CARDIOVERSION N/A 09/19/2017   Procedure: CARDIOVERSION;  Surgeon: [name], MD;  Location: [facility] ENDOSCOPY;  Service: Cardiovascular;  Laterality: N/A;   CARDIOVERSION N/A 10/25/2017   Procedure: CARDIOVERSION;  Surgeon: Nahser, Wonda Cheng, MD;  Location: [facility] ENDOSCOPY;  Service: Cardiovascular;  Laterality: N/A;   CARDIOVERSION N/A 12/27/2018   Procedure: CARDIOVERSION;  Surgeon: [name], MD;  Location: [facility] ENDOSCOPY;  Service: Cardiovascular;  Laterality: N/A;   CARDIOVERSION N/A 05/20/2019   Procedure: CARDIOVERSION;  Surgeon: [name], MD;  Location: [place];  Service: Cardiovascular;  Laterality:  N/A;   CARDIOVERSION N/A 12/10/2019   Procedure: CARDIOVERSION;  Surgeon: Pixie Casino, MD;  Location: [place];  Service: Cardiovascular;  Laterality: N/A;   CARDIOVERSION N/A 12/31/2019   Procedure: CARDIOVERSION;  Surgeon: Sanda Klein, MD;  Location: [place];  Service: Cardiovascular;  Laterality: N/A;   CARDIOVERSION N/A 10/21/2020   Procedure: CARDIOVERSION;  Surgeon: [name], MD;  Location: [place];  Service: Cardiovascular;  Laterality: N/A;   CARDIOVERSION N/A 12/03/2020   Procedure: CARDIOVERSION;  Surgeon: Jerline Pain, MD;  Location: [place];  Service: Cardiovascular;  Laterality: N/A;   CARDIOVERSION N/A 07/11/2021   Procedure: CARDIOVERSION;  Surgeon: Pixie Casino, MD;  Location: [facility] ENDOSCOPY;  Service: Cardiovascular;  Laterality: N/A;   CARPAL TUNNEL RELEASE Right    CATARACT EXTRACTION W/ INTRAOCULAR LENS  IMPLANT, BILATERAL Bilateral    COLONOSCOPY     EXCISIONAL  HEMORRHOIDECTOMY     INSERTION OF MESH N/A 05/25/2017   Procedure: INSERTION OF MESH;  Surgeon: Ralene Ok, MD;  Location: [facility];  Service: General;  Laterality: N/A;   TEE WITHOUT CARDIOVERSION N/A 03/30/2014   Procedure: TRANSESOPHAGEAL ECHOCARDIOGRAM (TEE);  Surgeon: [name], MD;  Location: [facility] ENDOSCOPY;  Service: Cardiovascular;  Laterality: N/A;   THYROIDECTOMY     TONSILLECTOMY      Current Outpatient Medications  Medication Sig Dispense Refill   psyllium (REGULOID) 0.52 g capsule Taking 3 capsules by mouth three times daily     ACCU-CHEK GUIDE test strip      acetaminophen (TYLENOL) 650 MG CR tablet Take 650 mg by mouth 3 (three) times daily.     Blood Glucose Monitoring Suppl (ACCU-CHEK GUIDE ME) w/Device KIT      Calcium Citrate (CITRACAL PO) Take 800 mg by mouth daily.      cetirizine (ZYRTEC) 10 MG tablet Take 10 mg by mouth daily.     cholecalciferol (VITAMIN D) 25 MCG (1000 UNIT) tablet Take 1,000 Units by mouth daily.     diclofenac Sodium (VOLTAREN) 1 % GEL Apply 2 g topically daily as needed (Knee pain).     ELIQUIS 5 MG TABS tablet TAKE 1 TABLET TWICE DAILY 180 tablet 3   ferrous sulfate 325 (65 FE) MG tablet Take 325 mg by mouth daily with breakfast.     fluticasone (FLONASE) 50 MCG/ACT nasal spray Place 1 spray into both nostrils at bedtime as needed for allergies.      lansoprazole (PREVACID) 30 MG capsule Take 30 mg by mouth daily as needed (acid reflux).      levothyroxine (SYNTHROID) 112 MCG tablet Take 112 mcg by mouth at bedtime. Middle of night     LORazepam (ATIVAN) 1 MG tablet Take 1.5 mg by mouth at bedtime. Take additional 0.5 mg if needed     Lutein 6 MG CAPS Take 6 mg by mouth daily.      metFORMIN (GLUCOPHAGE-XR) 500 MG 24 hr tablet Take 500 mg by mouth 2 (two) times daily.     metoprolol tartrate (LOPRESSOR) 50 MG tablet Take 2 tablets by mouth in the am and take two tablets in the evening     Multiple Vitamins-Minerals (PRESERVISION AREDS 2)  CAPS Take 1 capsule by mouth daily.     Polyvinyl Alcohol-Povidone PF 1.4-0.6 % SOLN Place 1 drop into both eyes daily as needed (dry eyes).      rosuvastatin (CRESTOR) 10 MG tablet Take 10 mg by mouth at bedtime.     sertraline (ZOLOFT) 50 MG tablet  Take 50 mg by mouth daily.     vitamin B-12 (CYANOCOBALAMIN) 500 MCG tablet Take 500 mcg by mouth daily.     zinc gluconate 50 MG tablet Take 50 mg by mouth daily.     No current facility-administered medications for this encounter.    Allergies  Allergen Reactions   Penicillins Itching, Rash and Other (See Comments)    Did it involve swelling of the face/tongue/throat, SOB, or low BP? No Did it involve sudden or severe rash/hives, skin peeling, or any reaction on the inside of your mouth or nose? No Did you need to seek medical attention at a hospital or doctor's office? No When did it last happen?    30+ years   If all above answers are "NO", may proceed with cephalosporin use.     Social History   Socioeconomic History   Marital status: Divorced    Spouse name: Not on file   Number of children: 2   Years of education: Not on file   Highest education level: Not on file  Occupational History   Occupation: Retired    Comment: Education officer, museum  Tobacco Use   Smoking status: Never   Smokeless tobacco: Never  Vaping Use   Vaping Use: Never used  Substance and Sexual Activity   Alcohol use: No    Alcohol/week: 0.0 standard drinks   Drug use: No   Sexual activity: Never  Other Topics Concern   Not on file  Social History Narrative   Not on file   Social Determinants of Health   Financial Resource Strain: Not on file  Food Insecurity: Not on file  Transportation Needs: Not on file  Physical Activity: Not on file  Stress: Not on file  Social Connections: Not on file  Intimate Partner Violence: Not on file    Family History  Problem Relation Age of Onset   Prostate cancer Father    Diabetes Sister    Heart disease  Sister    Heart attack Brother    Heart disease Brother    Diabetes Brother    Diabetes Sister    Diabetes Sister    Pancreatic cancer Sister    Diabetes Brother    Heart disease Brother    Diabetes Brother    Heart disease Brother    Diabetes Brother    Heart disease Brother    Diabetes Brother    Heart disease Brother    Diabetes Brother    Heart disease Brother    Diabetes Brother    Diabetes Child     ROS- All systems are reviewed and negative except as per the HPI above  Physical Exam: Vitals:   08/25/21 1354  BP: 136/88  Pulse: 93  Weight: 77 kg  Height: $Remove'5\' 6"'[id number]$  (1.676 m)   Wt Readings from Last 3 Encounters:  08/25/21 77 kg  08/04/21 79.4 kg  07/19/21 78.3 kg    Labs: Lab Results  Component Value Date   NA 139 07/05/2021   K 4.9 07/05/2021   CL 106 07/05/2021   CO2 27 07/05/2021   GLUCOSE 141 (H) 07/05/2021   BUN 21 07/05/2021   CREATININE 0.92 07/05/2021   CALCIUM 9.5 07/05/2021   MG 2.2 07/05/2021   Lab Results  Component Value Date   INR 1.2 10/21/2020   No results found for: CHOL, HDL, LDLCALC, TRIG   GEN- The patient is a well appearing elderly female, alert and oriented x 3 today.   HEENT-head normocephalic, atraumatic,  sclera clear, conjunctiva pink, hearing intact, trachea midline. Lungs- Clear to ausculation bilaterally, normal work of breathing Heart- irregular rate and rhythm, no murmurs, rubs or gallops  GI- soft, NT, ND, + BS Extremities- no clubbing, cyanosis, or edema MS- no significant deformity or atrophy Skin- no rash or lesion Psych- euthymic mood, full affect Neuro- strength and sensation are intact   EKG-  afib at 93 bpm, qrs int 90 ms, qtc 485 ms   Epic records reviewed   Assessment and Plan: 1. Persistent atrial fibrillation/atrial flutter Previous failure of amiodarone.  Loaded on dofetilide 10/2020  and for the last coupl of months Tikosyn failed to keep in rhythm  She is now off Tikosyn  Rate controlled afib  will be her treatment strategy going forward Continue metoprolol 100 mg bid   2. HTN Stable, no changes today.  3. CHA2DS2VASc score of 6 Eliquis 5 mg BID   4. Diarrhea Has slowed down with intake of fiber Weight is down several pounds  Bmet/mag/tsh today   F/u in 4 months   Butch Penny C. Melora Menon, [facility] [address] [place], [place] [address] [phone]

## 2021-08-31 ENCOUNTER — Other Ambulatory Visit (HOSPITAL_COMMUNITY): Payer: Self-pay | Admitting: Nurse Practitioner

## 2021-09-14 DIAGNOSIS — H53411 Scotoma involving central area, right eye: Secondary | ICD-10-CM | POA: Diagnosis not present

## 2021-09-14 DIAGNOSIS — H353112 Nonexudative age-related macular degeneration, right eye, intermediate dry stage: Secondary | ICD-10-CM | POA: Diagnosis not present

## 2021-09-14 DIAGNOSIS — H4421 Degenerative myopia, right eye: Secondary | ICD-10-CM | POA: Diagnosis not present

## 2021-09-14 DIAGNOSIS — H35453 Secondary pigmentary degeneration, bilateral: Secondary | ICD-10-CM | POA: Diagnosis not present

## 2021-09-14 DIAGNOSIS — H15831 Staphyloma posticum, right eye: Secondary | ICD-10-CM | POA: Diagnosis not present

## 2021-09-14 DIAGNOSIS — H353121 Nonexudative age-related macular degeneration, left eye, early dry stage: Secondary | ICD-10-CM | POA: Diagnosis not present

## 2021-09-14 DIAGNOSIS — H3122 Choroidal dystrophy (central areolar) (generalized) (peripapillary): Secondary | ICD-10-CM | POA: Diagnosis not present

## 2021-09-14 DIAGNOSIS — H35363 Drusen (degenerative) of macula, bilateral: Secondary | ICD-10-CM | POA: Diagnosis not present

## 2021-10-11 DIAGNOSIS — F320 Major depressive disorder, single episode, mild: Secondary | ICD-10-CM | POA: Diagnosis not present

## 2021-12-05 ENCOUNTER — Other Ambulatory Visit: Payer: Self-pay | Admitting: Physician Assistant

## 2021-12-05 ENCOUNTER — Ambulatory Visit
Admission: RE | Admit: 2021-12-05 | Discharge: 2021-12-05 | Disposition: A | Discharge status: Home / Self Care | Payer: Medicare PPO | Source: Ambulatory Visit | Attending: Physician Assistant | Admitting: Physician Assistant

## 2021-12-05 ENCOUNTER — Other Ambulatory Visit: Payer: Self-pay

## 2021-12-05 DIAGNOSIS — M25562 Pain in left knee: Secondary | ICD-10-CM

## 2021-12-05 DIAGNOSIS — M25561 Pain in right knee: Secondary | ICD-10-CM

## 2021-12-05 DIAGNOSIS — H538 Other visual disturbances: Secondary | ICD-10-CM

## 2021-12-05 DIAGNOSIS — S0990XA Unspecified injury of head, initial encounter: Secondary | ICD-10-CM | POA: Diagnosis not present

## 2021-12-05 DIAGNOSIS — G319 Degenerative disease of nervous system, unspecified: Secondary | ICD-10-CM | POA: Diagnosis not present

## 2021-12-05 DIAGNOSIS — M170 Bilateral primary osteoarthritis of knee: Secondary | ICD-10-CM | POA: Diagnosis not present

## 2021-12-05 DIAGNOSIS — Z7901 Long term (current) use of anticoagulants: Secondary | ICD-10-CM

## 2021-12-05 DIAGNOSIS — R9082 White matter disease, unspecified: Secondary | ICD-10-CM | POA: Diagnosis not present

## 2021-12-08 DIAGNOSIS — I4891 Unspecified atrial fibrillation: Secondary | ICD-10-CM | POA: Diagnosis not present

## 2021-12-08 DIAGNOSIS — E890 Postprocedural hypothyroidism: Secondary | ICD-10-CM | POA: Diagnosis not present

## 2021-12-08 DIAGNOSIS — I509 Heart failure, unspecified: Secondary | ICD-10-CM | POA: Diagnosis not present

## 2021-12-08 DIAGNOSIS — I110 Hypertensive heart disease with heart failure: Secondary | ICD-10-CM | POA: Diagnosis not present

## 2021-12-08 DIAGNOSIS — J302 Other seasonal allergic rhinitis: Secondary | ICD-10-CM | POA: Diagnosis not present

## 2021-12-08 DIAGNOSIS — Z6827 Body mass index (BMI) 27.0-27.9, adult: Secondary | ICD-10-CM | POA: Diagnosis not present

## 2021-12-08 DIAGNOSIS — D6869 Other thrombophilia: Secondary | ICD-10-CM | POA: Diagnosis not present

## 2021-12-08 DIAGNOSIS — G8929 Other chronic pain: Secondary | ICD-10-CM | POA: Diagnosis not present

## 2021-12-08 DIAGNOSIS — I4892 Unspecified atrial flutter: Secondary | ICD-10-CM | POA: Diagnosis not present

## 2021-12-08 DIAGNOSIS — G4733 Obstructive sleep apnea (adult) (pediatric): Secondary | ICD-10-CM | POA: Diagnosis not present

## 2021-12-08 DIAGNOSIS — E1142 Type 2 diabetes mellitus with diabetic polyneuropathy: Secondary | ICD-10-CM | POA: Diagnosis not present

## 2021-12-08 DIAGNOSIS — Z7409 Other reduced mobility: Secondary | ICD-10-CM | POA: Diagnosis not present

## 2021-12-08 DIAGNOSIS — K219 Gastro-esophageal reflux disease without esophagitis: Secondary | ICD-10-CM | POA: Diagnosis not present

## 2021-12-08 DIAGNOSIS — H3530 Unspecified macular degeneration: Secondary | ICD-10-CM | POA: Diagnosis not present

## 2021-12-08 DIAGNOSIS — M1990 Unspecified osteoarthritis, unspecified site: Secondary | ICD-10-CM | POA: Diagnosis not present

## 2021-12-08 DIAGNOSIS — R32 Unspecified urinary incontinence: Secondary | ICD-10-CM | POA: Diagnosis not present

## 2021-12-08 DIAGNOSIS — F324 Major depressive disorder, single episode, in partial remission: Secondary | ICD-10-CM | POA: Diagnosis not present

## 2021-12-08 DIAGNOSIS — E785 Hyperlipidemia, unspecified: Secondary | ICD-10-CM | POA: Diagnosis not present

## 2021-12-19 ENCOUNTER — Ambulatory Visit: Payer: Medicare PPO | Admitting: Orthopedic Surgery

## 2021-12-19 DIAGNOSIS — H35363 Drusen (degenerative) of macula, bilateral: Secondary | ICD-10-CM | POA: Diagnosis not present

## 2021-12-19 DIAGNOSIS — H35453 Secondary pigmentary degeneration, bilateral: Secondary | ICD-10-CM | POA: Diagnosis not present

## 2021-12-19 DIAGNOSIS — H15831 Staphyloma posticum, right eye: Secondary | ICD-10-CM | POA: Diagnosis not present

## 2021-12-19 DIAGNOSIS — H3122 Choroidal dystrophy (central areolar) (generalized) (peripapillary): Secondary | ICD-10-CM | POA: Diagnosis not present

## 2021-12-19 DIAGNOSIS — H35371 Puckering of macula, right eye: Secondary | ICD-10-CM | POA: Diagnosis not present

## 2021-12-19 DIAGNOSIS — H353112 Nonexudative age-related macular degeneration, right eye, intermediate dry stage: Secondary | ICD-10-CM | POA: Diagnosis not present

## 2021-12-19 DIAGNOSIS — H353121 Nonexudative age-related macular degeneration, left eye, early dry stage: Secondary | ICD-10-CM | POA: Diagnosis not present

## 2021-12-28 ENCOUNTER — Ambulatory Visit: Payer: Medicare PPO | Admitting: Surgical

## 2021-12-28 ENCOUNTER — Encounter: Payer: Self-pay | Admitting: Orthopedic Surgery

## 2021-12-28 ENCOUNTER — Other Ambulatory Visit: Payer: Self-pay

## 2021-12-28 DIAGNOSIS — M170 Bilateral primary osteoarthritis of knee: Secondary | ICD-10-CM

## 2021-12-29 ENCOUNTER — Encounter: Payer: Self-pay | Admitting: Orthopedic Surgery

## 2021-12-29 ENCOUNTER — Ambulatory Visit (HOSPITAL_COMMUNITY): Payer: Medicare PPO | Admitting: Nurse Practitioner

## 2021-12-29 MED ORDER — LIDOCAINE HCL 1 % IJ SOLN
5.0000 mL | INTRAMUSCULAR | Status: AC | PRN
  Administered 2021-12-28: 5 mL

## 2021-12-29 MED ORDER — BUPIVACAINE HCL 0.25 % IJ SOLN
4.0000 mL | INTRAMUSCULAR | Status: AC | PRN
  Administered 2021-12-28: 4 mL via INTRA_ARTICULAR

## 2021-12-29 MED ORDER — METHYLPREDNISOLONE ACETATE 40 MG/ML IJ SUSP
40.0000 mg | INTRAMUSCULAR | Status: AC | PRN
  Administered 2021-12-28: 40 mg via INTRA_ARTICULAR

## 2021-12-29 NOTE — Progress Notes (Signed)
Office Visit Note   Patient: [name]           Date of Birth: 03-Sep-1931           MRN: [id number] Visit Date: 12/28/2021 Requested by: Lavone Orn, MD [address] [place],  [place] [address] PCP: Lavone Orn, MD  Subjective: Chief Complaint  Patient presents with   Right Knee - Follow-up   Left Knee - Follow-up    HPI: [name] is a 88 y.o. female who presents to the office complaining of bilateral knee pain.  Patient has a history of bilateral knee osteoarthritis.  She had a recent fall several weeks ago with radiographs 3 weeks ago that demonstrated no fracture but severe osteoarthritis of both knees.  She had no history of previous knee surgery.  She has been taking over-the-counter Tylenol arthritis.  She has history of atrial fibrillation and takes Eliquis.  No history of uncontrolled diabetes.  Denies any groin pain.  She has been able to ambulate over the last several weeks since her fall..                ROS: All systems reviewed are negative as they relate to the chief complaint within the history of present illness.  Patient denies fevers or chills.  Assessment & Plan: Visit Diagnoses:  1. Primary osteoarthritis of both knees     Plan: Patient is an 88 year old female who presents for evaluation of bilateral knee pain.  She had fall several weeks ago with radiographs that are negative for any fracture or acute changes.  She has history of bilateral knee osteoarthritis and has had previous injections in her knees with last injection in mid 2020.  With increased pain since her fall, she would like to try bilateral knee injections today to see if this will improve her pain to how she was prior to her fall.  Both knees were injected and patient tolerated the procedure well without complication.  Follow-up with the office as needed with soonest repeat injection in 3 to 4 months.  Follow-Up Instructions: No follow-ups on file.   Orders:  No  orders of the defined types were placed in this encounter.  No orders of the defined types were placed in this encounter.     Procedures: Large Joint Inj: bilateral knee on 12/28/2021 9:12 AM Indications: diagnostic evaluation, joint swelling and pain Details: 18 G 1.5 in needle, superolateral approach  Arthrogram: No  Medications (Right): 5 mL lidocaine 1 %; 4 mL bupivacaine 0.25 %; 40 mg methylPREDNISolone acetate 40 MG/ML Medications (Left): 5 mL lidocaine 1 %; 4 mL bupivacaine 0.25 %; 40 mg methylPREDNISolone acetate 40 MG/ML Outcome: tolerated well, no immediate complications Procedure, treatment alternatives, risks and benefits explained, specific risks discussed. Consent was given by the patient. Immediately prior to procedure a time out was called to verify the correct patient, procedure, equipment, support staff and site/side marked as required. Patient was prepped and draped in the usual sterile fashion.      Clinical Data: No additional findings.  Objective: Vital Signs: LMP  (LMP Unknown)   Physical Exam:  Constitutional: Patient appears well-developed HEENT:  Head: Normocephalic Eyes:EOM are normal Neck: Normal range of motion Cardiovascular: Normal rate Pulmonary/chest: Effort normal Neurologic: Patient is alert Skin: Skin is warm Psychiatric: Patient has normal mood and affect  Ortho Exam: Ortho exam demonstrates bilateral knees with no effusion.  No ecchymosis or skin changes noted.  No hematoma.  Tenderness  over the medial and lateral joint lines of both knees.  Range of motion from 3 degrees extension to 100 degrees of knee flexion bilaterally.  No calf tenderness.  Negative Homans' sign.  No pain with hip range of motion in either hip.  Specialty Comments:  No specialty comments available.  Imaging: No results found.   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   Post-surgical hypothyroidism 04/09/2014   Anticoagulated by anticoagulation treatment - Eliquis 03/30/2014   Diabetes mellitus without complication ([facility])    Heart murmur    DJD (degenerative joint disease) of knee    Glaucoma    History of laser assisted in situ keratomileusis 07/25/2012   Degenerative disorder of eye 07/25/2012   Exotropia, intermittent 07/09/2012   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   Error, refractive, myopia 07/09/2012   ANEMIA, IRON DEFICIENCY 10/14/2010   GERD 10/14/2010   THYROID CANCER 10/13/2010   HYPERCHOLESTEROLEMIA 10/13/2010   ANEMIA, UNSPECIFIED 10/13/2010   Anxiety state 10/13/2010   MIGRAINE HEADACHE 10/13/2010   Essential hypertension 10/13/2010   EROSIVE ESOPHAGITIS 10/13/2010   BARRETTS ESOPHAGUS 10/13/2010   Hiatal hernia s/p robotic repair & fundoplication 02/06/7123 58/07/9832   DIVERTICULOSIS, COLON 10/13/2010   Irritable bowel syndrome 10/13/2010   OSTEOARTHRITIS 10/13/2010   CARDIAC MURMUR 10/13/2010   COLONIC POLYPS, ADENOMATOUS, HX OF 10/13/2010   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   Allergy    SEASONAL   Anemia    years ago   Anticoagulated by anticoagulation treatment - Eliquis 03/30/2014   Started May 2015    Anxiety state 10/13/2010   Qualifier: Diagnosis of  By:  Nils Pyle CMA (AAMA), Leisha     Arthritis    "all my joints" (03/31/2014)   Barrett's esophagus    Breast cancer ([place] [facility])    s/p right breast lumpectomy   CARDIAC MURMUR 10/13/2010   Qualifier: Diagnosis of  By: Nils Pyle CMA (AAMA), Leisha     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   Cervical cancer ([facility])    Chronic cough 01/02/2017   GERD, hiatal hernia Doubt amio   COLONIC POLYPS, ADENOMATOUS, HX OF 10/13/2010   Qualifier: Diagnosis of  By: Nils Pyle CMA (AAMA), Leisha     Degenerative disorder of eye 07/25/2012   Diabetes mellitus without complication ([facility])    Dilated cardiomyopathy ([place]) 08/27/2018   DIVERTICULOSIS, COLON 10/13/2010   Qualifier: Diagnosis of  By: Nils Pyle CMA (AAMA), Leisha     DJD (degenerative joint disease) of knee    Dysrhythmia    Afib   Echocardiogram 07/2020    Echo 9/21: EF 70-75, moderate LVH, GR 1 DD, RVSP 40.5 (mildly elevated), normal RVSF, moderate LAE, mild RAE, mild MR, trivial AI   EROSIVE ESOPHAGITIS 10/13/2010   Qualifier: Diagnosis of  By: Nils Pyle CMA (AAMA), Mearl Latin  Error, refractive, myopia 07/09/2012   Essential hypertension 10/13/2010   Qualifier: Diagnosis of  By: Nils Pyle CMA (AAMA), Rueben Bash, intermittent 07/09/2012   GERD 10/14/2010   Qualifier: Diagnosis of  By: Sharlett Iles MD Cline Cools R    GERD (gastroesophageal reflux disease)    Glaucoma    H/O hiatal hernia    Heart murmur    Hiatal hernia s/p robotic repair & fundoplication 5/46/2703 50/0/9381   Qualifier: Diagnosis of  By: Nils Pyle CMA (AAMA), Mearl Latin     History of laser assisted in situ keratomileusis 07/25/2012   Hx of transesophageal echocardiography (TEE) for monitoring    TEE (03/2014): No LAA clot, moderate LAE, core triatriatum type structure in RA with no stenosis, normal EF 55-60%, mild MR   HYPERCHOLESTEROLEMIA 10/13/2010   Qualifier: Diagnosis of  By: Nils Pyle CMA (AAMA), Leisha     Hypertension    Hypothyroidism, postsurgical    Iron Deficiency Anemia 10/14/2010   Qualifier:  Diagnosis of  By: Sharlett Iles MD Cline Cools R    Irritable bowel syndrome 10/13/2010   Qualifier: Diagnosis of  By: Nils Pyle CMA (AAMA), Leisha     Lumbar stenosis with neurogenic claudication 04/20/2016   Malignant neoplasm of breast ([place]) 02/25/2016   Migraine    "last one was years ago" (03/31/2014)   MIGRAINE HEADACHE 10/13/2010   Qualifier: Diagnosis of  By: Nils Pyle CMA (AAMA), Leisha     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   Overview:  X(T)    OSA on CPAP    settings at 10-11    OSTEOARTHRITIS 10/13/2010   Qualifier: Diagnosis of  By: Nils Pyle CMA (AAMA), Leisha     Persistent atrial fibrillation 03/31/2015   DLCO dropped from 100% in 2015-50% in 01/2017   Post-surgical hypothyroidism 04/09/2014   RECTAL FISSURE 10/13/2010   Qualifier: Diagnosis of  By: Nils Pyle CMA (AAMA), Mearl Latin     S/P Nissen fundoplication (without gastrostomy tube) procedure 05/25/2017   Sleep apnea    THYROID CANCER 10/13/2010   Qualifier: Diagnosis of  By: Nils Pyle CMA Deborra Medina), Mearl Latin     Thyroid cancer [facility])    s/p thyroidectomy   Type II diabetes mellitus ([place])    type II     Family History  Problem Relation Age of Onset   Prostate cancer Father    Diabetes Sister    Heart disease Sister    Heart attack Brother    Heart disease Brother    Diabetes Brother    Diabetes Sister    Diabetes Sister    Pancreatic cancer Sister    Diabetes Brother    Heart disease Brother    Diabetes Brother    Heart disease Brother    Diabetes Brother    Heart disease Brother    Diabetes Brother    Heart disease Brother    Diabetes Brother    Heart disease Brother    Diabetes Brother    Diabetes Child     Past Surgical History:  Procedure Laterality Date   ABDOMINAL HYSTERECTOMY     "partial"   BREAST BIOPSY Right    BREAST LUMPECTOMY Right    CARDIOVERSION N/A 03/30/2014   Procedure: CARDIOVERSION - BEDSIDE;  Surgeon: [name], MD;  Location: [facility];  Service: Cardiovascular;  Laterality: N/A;   CARDIOVERSION N/A  03/31/2014   Procedure: CARDIOVERSION  (BEDSIDE) ;  Surgeon: Lelon Perla, MD;  Location: [facility];  Service: Cardiovascular;  Laterality: N/A;   CARDIOVERSION N/A 04/27/2014   Procedure:  CARDIOVERSION;  Surgeon: Darlin Coco, MD;  Location: [facility];  Service: Cardiovascular;  Laterality: N/A;   CARDIOVERSION N/A 04/13/2015   Procedure: CARDIOVERSION;  Surgeon: Thayer Headings, MD;  Location: [facility];  Service: Cardiovascular;  Laterality: N/A;   CARDIOVERSION N/A 01/08/2017   Procedure: CARDIOVERSION;  Surgeon: Sanda Klein, MD;  Location: [facility];  Service: Cardiovascular;  Laterality: N/A;   CARDIOVERSION N/A 09/19/2017   Procedure: CARDIOVERSION;  Surgeon: [name], MD;  Location: [facility];  Service: Cardiovascular;  Laterality: N/A;   CARDIOVERSION N/A 10/25/2017   Procedure: CARDIOVERSION;  Surgeon: Nahser, Wonda Cheng, MD;  Location: [facility];  Service: Cardiovascular;  Laterality: N/A;   CARDIOVERSION N/A 12/27/2018   Procedure: CARDIOVERSION;  Surgeon: [name], MD;  Location: [facility];  Service: Cardiovascular;  Laterality: N/A;   CARDIOVERSION N/A 05/20/2019   Procedure: CARDIOVERSION;  Surgeon: [name], MD;  Location: [facility];  Service: Cardiovascular;  Laterality: N/A;   CARDIOVERSION N/A 12/10/2019   Procedure: CARDIOVERSION;  Surgeon: Pixie Casino, MD;  Location: [facility];  Service: Cardiovascular;  Laterality: N/A;   CARDIOVERSION N/A 12/31/2019   Procedure: CARDIOVERSION;  Surgeon: Sanda Klein, MD;  Location: [facility];  Service: Cardiovascular;  Laterality: N/A;   CARDIOVERSION N/A 10/21/2020   Procedure: CARDIOVERSION;  Surgeon: [name], MD;  Location: [facility];  Service: Cardiovascular;  Laterality: N/A;   CARDIOVERSION N/A 12/03/2020   Procedure: CARDIOVERSION;  Surgeon: Jerline Pain, MD;  Location: [facility];  Service: Cardiovascular;  Laterality: N/A;   CARDIOVERSION N/A 07/11/2021    Procedure: CARDIOVERSION;  Surgeon: Pixie Casino, MD;  Location[facility];  Service: Cardiovascular;  Laterality: N/A;   CARPAL TUNNEL RELEASE Right    CATARACT EXTRACTION W/ INTRAOCULAR LENS  IMPLANT, BILATERAL Bilateral    COLONOSCOPY     EXCISIONAL HEMORRHOIDECTOMY     INSERTION OF MESH N/A 05/25/2017   Procedure: INSERTION OF MESH;  Surgeon: Ralene Ok, MD;  Location: [facility];  Service: General;  Laterality: N/A;   TEE WITHOUT CARDIOVERSION N/A 03/30/2014   Procedure: TRANSESOPHAGEAL ECHOCARDIOGRAM (TEE);  Surgeon: [name], MD;  Location[facility]COPY;  Service: Cardiovascular;  Laterality: N/A;   THYROIDECTOMY     TONSILLECTOMY     Social History   Occupational History   Occupation: Retired    Comment: Education officer, museum  Tobacco Use   Smoking status: Never   Smokeless tobacco: Never  Vaping Use   Vaping Use: Never used  Substance and Sexual Activity   Alcohol use: No    Alcohol/week: 0.0 standard drinks   Drug use: No   Sexual activity: Never

## 2022-01-30 ENCOUNTER — Other Ambulatory Visit (HOSPITAL_COMMUNITY): Payer: Self-pay | Admitting: Nurse Practitioner

## 2022-02-06 DIAGNOSIS — R531 Weakness: Secondary | ICD-10-CM | POA: Diagnosis not present

## 2022-02-16 DIAGNOSIS — D6869 Other thrombophilia: Secondary | ICD-10-CM | POA: Diagnosis not present

## 2022-02-16 DIAGNOSIS — E7800 Pure hypercholesterolemia, unspecified: Secondary | ICD-10-CM | POA: Diagnosis not present

## 2022-02-16 DIAGNOSIS — D509 Iron deficiency anemia, unspecified: Secondary | ICD-10-CM | POA: Diagnosis not present

## 2022-02-16 DIAGNOSIS — K529 Noninfective gastroenteritis and colitis, unspecified: Secondary | ICD-10-CM | POA: Diagnosis not present

## 2022-02-16 DIAGNOSIS — F320 Major depressive disorder, single episode, mild: Secondary | ICD-10-CM | POA: Diagnosis not present

## 2022-02-16 DIAGNOSIS — Z0000 Encounter for general adult medical examination without abnormal findings: Secondary | ICD-10-CM | POA: Diagnosis not present

## 2022-02-16 DIAGNOSIS — E039 Hypothyroidism, unspecified: Secondary | ICD-10-CM | POA: Diagnosis not present

## 2022-02-16 DIAGNOSIS — Z23 Encounter for immunization: Secondary | ICD-10-CM | POA: Diagnosis not present

## 2022-02-16 DIAGNOSIS — E1142 Type 2 diabetes mellitus with diabetic polyneuropathy: Secondary | ICD-10-CM | POA: Diagnosis not present

## 2022-02-16 DIAGNOSIS — I10 Essential (primary) hypertension: Secondary | ICD-10-CM | POA: Diagnosis not present

## 2022-02-22 ENCOUNTER — Ambulatory Visit (HOSPITAL_COMMUNITY)
Admission: RE | Admit: 2022-02-22 | Discharge: 2022-02-22 | Disposition: A | Discharge status: Home / Self Care | Payer: Medicare PPO | Source: Ambulatory Visit | Attending: Nurse Practitioner | Admitting: Nurse Practitioner

## 2022-02-22 ENCOUNTER — Encounter (HOSPITAL_COMMUNITY): Payer: Self-pay | Admitting: Nurse Practitioner

## 2022-02-22 VITALS — BP 126/74 | HR 106 | Ht 66.0 in | Wt 164.0 lb

## 2022-02-22 DIAGNOSIS — I4821 Permanent atrial fibrillation: Secondary | ICD-10-CM | POA: Diagnosis not present

## 2022-02-22 DIAGNOSIS — Z7901 Long term (current) use of anticoagulants: Secondary | ICD-10-CM | POA: Insufficient documentation

## 2022-02-22 DIAGNOSIS — R197 Diarrhea, unspecified: Secondary | ICD-10-CM | POA: Insufficient documentation

## 2022-02-22 DIAGNOSIS — Z79899 Other long term (current) drug therapy: Secondary | ICD-10-CM | POA: Insufficient documentation

## 2022-02-22 DIAGNOSIS — I10 Essential (primary) hypertension: Secondary | ICD-10-CM | POA: Insufficient documentation

## 2022-02-22 DIAGNOSIS — Z8249 Family history of ischemic heart disease and other diseases of the circulatory system: Secondary | ICD-10-CM | POA: Diagnosis not present

## 2022-02-22 DIAGNOSIS — I4819 Other persistent atrial fibrillation: Secondary | ICD-10-CM | POA: Insufficient documentation

## 2022-02-22 DIAGNOSIS — I4892 Unspecified atrial flutter: Secondary | ICD-10-CM | POA: Insufficient documentation

## 2022-02-22 DIAGNOSIS — D6869 Other thrombophilia: Secondary | ICD-10-CM

## 2022-02-22 MED ORDER — METOPROLOL TARTRATE 100 MG PO TABS: 100.0000 mg | ORAL_TABLET | Freq: Two times a day (BID) | ORAL | 1 refills | Status: DC

## 2022-02-22 NOTE — Progress Notes (Signed)
? ?Primary Care Physician: Lavone Orn, MD ?Cardiologist: [name] ?Pulmonologist: Dr. Elsworth Soho ?GI: Dr. Carlean Purl ? ? ?[name] is a 88 y.o. female with a h/o afib that has been on amiodarone since June of 2015. She has had many breakthrough events, last one in February with successful cardioversion. She does feel very weak in afib. She went into afib around 2 weeks ago and a RN came out to her house on Friday and documented the afib.  Dr. Rayann Heman was called and left instructions to increase amio to 200 mg bid and stop losartan. Apparently her BP was low on that day. ? ?She is in now in the afib clinc and she is rate controlled and BP is stable. She reports around 2-3 weeks ago she first had 2 teeth pulled and then a shot of cortisone in her knee. She hasn't felt well since. She usually requires a cardioversion to restore SR.   ? ?F/u in afib clinic 05/28/19 after successful cardioversion. She is in SR today and feels improved. She is back to Amio 200 mg daily. ? ?F/u in afib clinic, 11/25/19, with feelings of not being well and being out of rhythm x one week. She has had an UTI and just finished a round of doxycycline. EKG shows atrial flutter  variable rate at 110 bpm.CHA2DS2-VASc Score = 6, continues eliquis 5 mg bid. 1 ? ? F/u in  afib clinic, 12/16/18. She had a successful cardioversion but unfortunately felt like it only lasted one day. She is in afib in the 90's. I will try to increase BB and keep amio  at 200 mg a little longer to see if she may return to SR.  ? ?F/u  in afib clinic, 01/15/20.  F/u cardioversion 2/17. She was kept on her higher dose of BB at time of cardioversion as she did covert out but with PAC's. She saw [name] one week out and was staying in [place]. She  was left on amiodarone 200 mg bid until f/u here. She called the office earlier in the week and c/o of feeling weak. EKG shows  Sinus brady at 48 bpm. I feel her bradycardia is the reason for her weakness and will titrate meds down  today.  ? ?F/u 01/22/20. She remains  in SR  in the low 60's but does not feel that much better.  Back on amiodarone 200 mg daily from  BID and lower dose of BB. Marland Kitchen She had some GI distress yesterday and recently had issues with UTI. She  denies urgency or frequency today. ? ?F/u in afib clinic, 4/1, as pt has gone back into afib. Discussed with her I feel that amiodarone is no longer effective and I think it is best to stop drug at this point and rate control. If qt shortens off 3 month amiodarone wash out, currently at 512 ms then tikosyn may be an option down the line. She has multiple cardioversions with significant ERAF. ? ?F/u in afib clinic, 4/14. She is feeling better off amiodarone and she is rate controlled in the 80's but would like to see her a little slower so will increase BB today. We can consider Tikosyn after  3 month amiodarone wash out if qt interval shortens.  ? ?03/10/21- She actually is feeling better and had the enegy to do more around her house and some light exercise. She is upset today as she had some diarrhea this am and had an accident in the clinic. I feel  this is why her BP is higher today as she usually  does not have high BP. She feels it may the the plate of food her neighbor gave her last night. She is rate controlled at home in the 70's and 80's.  ? ?F/u in afib clinic, 6/29. She remains in rate controlled afib and states that she feels very good. She is exercising without any significant shortness of breath and is rate controlled. No excessive fluid retention. I feel that amiodarone may have been contributing to fatigue and she feels better off drug. She remains on eliquis 5 mg bid for a CHA2DS2VASc score of 6. ? ?F/u in afib clinic, 08/04/20. She  remains in rate controlled afib and initially tells me that she is exercising and feels great, but then tells me she  feels bad and would love to get back in rhythm. QT corrected around 430 ms. She has now been off amiodarone x 5 months.   ? ?F/u in afib clinic, 10/19/20,  as pt wants to be admitted for Tikosyn initiation. She  states that she feels terrible in afib. She has been living afib for several months . I cut back her BB yesterday as she is slow in SR when she returns to SR and she is  not quite as well rate controlled today. She has been on 50 mg metoprolol bid with nice rate control.   She failed amiodarone after taking for years  and it was stopped late Spring after multiple cardioversion that would only hold short term.  She  has been off drug for sufficient period of time to try Tikosyn Her qtc today is 480 ms, but in SR with amiodarone on board was 460 ms, EKg  in spring 2015 without amio was 439 ms.  No benadryl use.  No missed anticoagulation. Electrolytes ok for admission with crcl cal at 64 so she will qualify for 500 msc. Covid negative. + vaccines. ? ?F/u in afib clinic, 10/29/20,  one week after hospital admit for Tikosyn. Her qt prolonged and she was discharged on 125 mcg bid. She  did require cardioversion. She states today she feels improved. Her qt is 500 ms. I will need to recheck qt early next week.  ? ?F/u in afib clinic, 11/26/20, as she reported to the office last week that she went back into aifb. She states that she got stressed over her son falling and breaking his knee and a covid outbreak in the family but she was not exposed.  ?Ekg today shows atrial flutter with RVR with v rates in the 130's. Despite this pt feels well.  Will schedule cardioversion. She continues on Tikosyn 125 mcg bid. No missed eliquis with CHA2DS2VASc score of 6.  ? ?Follow up in the AF clinic 06/13/21. Patient reports that she went back into afib a few days after calling to cancel her DCCV in 01/2021. She states she has been in afib for months. She went to see her PCP on 7/28 and was told her heart rates were elevated. It is unclear how symptomatic she is with her afib.  ? ?F/u in afib clinic 06/30/21. She remains in afib with RVR. We discussed  cardioversion vrs further adjustment of her meds. She would prefer to increase BB again and if the does not improve things then pursue CV.  ? ?F/u 07/05/21. Despite increase in BB, pt has not converted to SR and remains in afib, rate controlled. She feels better in SR, she has fatigue. With  her being on tikosyn, we will try one more cardioversion but if this fails to keep her in rhythm, we will need to discuss stopping tikosyn and living in afib. She  has failed amiodarone in the past as well.  ? ?F/u in afib clinic, 07/19/21. She had a successful cardioversion but went back into afib by the next am. She did not feel any better but noted by her heart rates. She also thinks she may have taken an extra Tikosyn today. We discussed  stopping her Tikosyn but she feels she may go back in. I compromised and I will bring back in 2 weeks and if still out of rhythm will stop Tikosyn and she will live in rate controlled afib. She will not take her Tikosyn this pm for possible extra dose at noon.  ? ?F/u in afib clinic, 08/05/21. SHe remains  in afib, rate controlled . We discussed stopping tikosyn as it as well is not keeping her in rhythm. Last cardioversion with ERAF.  ? ?F/u in afib clinic, 08/25/21. She remains in rate controlled afib. She is off [place]. Her biggest concern is that she has had around 5 watery stools a day for the last 2 weeks with bowel incontinence. A friend of hers told her to try metamucil and eat apples with the skin and this is the first day she had a formed stool.   Her weight is down several pounds.  ? ?F/u in the afib clinic, 02/22/22. She is in permanent afib. She is having issues with frequent diarrhea. PCP aware and stopped metamucil which has helped some this week. No blood in stool.  ? ?Today, she denies symptoms of chest pain, shortness of breath, orthopnea, PND, lower extremity edema, dizziness, presyncope, syncope, or neurologic sequela.  The patient is tolerating medications without  difficulties and is otherwise without complaint today.  ? ?Past Medical History:  ?Diagnosis Date  ? Acute bronchitis 11/15/2017  ? ADENOCARCINOMA, BREAST 10/13/2010  ? Qualifier: Diagnosis of  By: Nils Pyle CMA Deborra Medina), Pricilla Loveless

## 2022-02-24 ENCOUNTER — Encounter: Payer: Self-pay | Admitting: Physician Assistant

## 2022-03-15 ENCOUNTER — Ambulatory Visit: Payer: Medicare PPO | Admitting: Physician Assistant

## 2022-03-15 ENCOUNTER — Encounter: Payer: Self-pay | Admitting: Physician Assistant

## 2022-03-15 ENCOUNTER — Other Ambulatory Visit (INDEPENDENT_AMBULATORY_CARE_PROVIDER_SITE_OTHER): Payer: Medicare PPO

## 2022-03-15 VITALS — BP 120/64 | HR 84

## 2022-03-15 DIAGNOSIS — A09 Infectious gastroenteritis and colitis, unspecified: Secondary | ICD-10-CM

## 2022-03-15 DIAGNOSIS — R634 Abnormal weight loss: Secondary | ICD-10-CM | POA: Diagnosis not present

## 2022-03-15 DIAGNOSIS — R152 Fecal urgency: Secondary | ICD-10-CM | POA: Diagnosis not present

## 2022-03-15 LAB — CBC WITH DIFFERENTIAL/PLATELET
Basophils Absolute: 0.1 10*3/uL (ref 0.0–0.1)
Basophils Relative: 1 % (ref 0.0–3.0)
Eosinophils Absolute: 0.2 10*3/uL (ref 0.0–0.7)
Eosinophils Relative: 3.2 % (ref 0.0–5.0)
HCT: 38 % (ref 36.0–46.0)
Hemoglobin: 12.7 g/dL (ref 12.0–15.0)
Lymphocytes Relative: 16.9 % (ref 12.0–46.0)
Lymphs Abs: 0.9 10*3/uL (ref 0.7–4.0)
MCHC: 33.5 g/dL (ref 30.0–36.0)
MCV: 95.1 fl (ref 78.0–100.0)
Monocytes Absolute: 0.5 10*3/uL (ref 0.1–1.0)
Monocytes Relative: 8.9 % (ref 3.0–12.0)
Neutro Abs: 3.7 10*3/uL (ref 1.4–7.7)
Neutrophils Relative %: 70 % (ref 43.0–77.0)
Platelets: 158 10*3/uL (ref 150.0–400.0)
RBC: 4 Mil/uL (ref 3.87–5.11)
RDW: 13.5 % (ref 11.5–15.5)
WBC: 5.3 10*3/uL (ref 4.0–10.5)

## 2022-03-15 LAB — COMPREHENSIVE METABOLIC PANEL
ALT: 8 U/L (ref 0–35)
AST: 11 U/L (ref 0–37)
Albumin: 3.9 g/dL (ref 3.5–5.2)
Alkaline Phosphatase: 51 U/L (ref 39–117)
BUN: 21 mg/dL (ref 6–23)
CO2: 29 mEq/L (ref 19–32)
Calcium: 9.4 mg/dL (ref 8.4–10.5)
Chloride: 104 mEq/L (ref 96–112)
Creatinine, Ser: 0.69 mg/dL (ref 0.40–1.20)
GFR: 77.17 mL/min (ref >60.00)
Glucose, Bld: 130 mg/dL — ABNORMAL HIGH (ref 70–99)
Potassium: 3.7 mEq/L (ref 3.5–5.1)
Sodium: 140 mEq/L (ref 135–145)
Total Bilirubin: 0.6 mg/dL (ref 0.2–1.2)
Total Protein: 6.3 g/dL (ref 6.0–8.3)

## 2022-03-15 NOTE — Patient Instructions (Signed)
If you are age 86 or older, your body mass index should be between 23-30. Your There is no height or weight on file to calculate BMI. If this is out of the aforementioned range listed, please consider follow up with your Primary Care Provider. ? ?If you are age 43 or younger, your body mass index should be between 19-25. Your There is no height or weight on file to calculate BMI. If this is out of the aformentioned range listed, please consider follow up with your  ?Primary Care Provider.  ? ?Your provider has requested that you go to the basement level for lab work before leaving today. Press "B" on the elevator. The lab is located at the first door on the left as you exit the elevator. ? ?Try decreasing Metformin to once daily. ? ?Try imodium once daily.  ? ?You have been scheduled for a CT scan of the abdomen and pelvis at [facility], 1st floor Radiology. You are scheduled on 03/23/22  at 9:30am. You should arrive 15 minutes prior to your appointment time for registration.  Please pick up 2 bottles of contrast from [place] at least 3 days prior to your scan. The solution may taste better if refrigerated, but do NOT add ice or any other liquid to this solution. Shake well before drinking.  ? ?Please follow the written instructions below on the day of your exam:  ? ?1) Do not eat anything after 5:30am (4 hours prior to your test)  ? ?2) Drink 1 bottle of contrast @ 7:30am (2 hours prior to your exam)  Remember to shake well before drinking and do NOT pour over ice. ?    Drink 1 bottle of contrast @ 8:30am (1 hour prior to your exam)  ? ?You may take any medications as prescribed with a small amount of water, if necessary. If you take any of the following medications: METFORMIN, GLUCOPHAGE, GLUCOVANCE, AVANDAMET, RIOMET, FORTAMET, ACTOPLUS MET, JANUMET, [place] or METAGLIP, you MAY be asked to HOLD this medication 48 hours AFTER the exam.  ? ?The purpose of you drinking the oral contrast is to aid in  the visualization of your intestinal tract. The contrast solution may cause some diarrhea. Depending on your individual set of symptoms, you may also receive an intravenous injection of x-ray contrast/dye. Plan on being at [facility] for 45 minutes or longer, depending on the type of exam you are having performed.  ? ?If you have any questions regarding your exam or if you need to reschedule, you may call [facility] at [phone] between the hours of 8:00 am and 5:00 pm, Monday-Friday.  ? ?The [facility] [facility] providers would like to encourage you to use [facility] to communicate with providers for non-urgent requests or questions.  Due to long hold times on the telephone, sending your provider a message by [facility] may be a faster and more efficient way to get a response.  Please allow 48 business hours for a response.  Please remember that this is for non-urgent requests.  ? ?Due to recent changes in healthcare laws, you may see the results of your imaging and laboratory studies on MyChart before your provider has had a chance to review them.  We understand that in some cases there may be results that are confusing or concerning to you. Not all laboratory results come back in the same time frame and the provider may be waiting for multiple results in order to interpret others.  Please give  [place] 48 hours in order for your provider to thoroughly review all the results before contacting the office for clarification of your results.  ? ?It was a pleasure to see you today! ? ?Thank you for trusting me with your gastrointestinal care!   ? ?Ellouise Newer, PA-C  ? ?

## 2022-03-15 NOTE — Progress Notes (Signed)
? ?Chief Complaint: Diarrhea ? ?HPI: ?   [name] is an 88 year old female with past medical history as listed below including A-fib on Eliquis and breast cancer, Barrett's esophagus and multiple others, known to Dr. Carlean Purl, who was referred to me by Lavone Orn, MD for a complaint of diarrhea. ?   03/04/2008 colonoscopy done for history of adenomatous polyps with diverticulosis and no polyps at that time. ?   06/09/2015 EGD with E3 columnar tongue seen at the GE junction, hiatal hernia and moderate nonerosive gastritis.  Pathology showed Barrett's with no dysplasia and no recall was placed due to age. ?   05/25/2017 surgical hiatal hernia repair and Nissen fundoplication. ?   Today, the patient presents accompanied by family member who helps with her history.  She tells me that for the past 4 to 5 months she has had nothing but loose stools multiple times a day that are quite urgent and she has a lot of accidents.  Due to this she is now wearing depends because she can just never seem to make it to the toilet.  Along with this there is a documented weight loss of around 20 pounds in the past 6 months unintentionally.  She tells me her PCP started by stopping medications, initially Metamucil, then iron, then sertraline and now has told her to back off of her Metformin to 1 tab a day instead of 2 to see if this helps with the diarrhea.  All of these changes have helped slightly but she continues with problems.  She has not tried any over-the-counter antidiarrheals.  Associated symptoms include occasional left lower quadrant pain which she attributes to eating nuts and her diverticulosis. ?   Denies fever, chills, no blood in her stool, nausea or vomiting. ? ?Past Medical History:  ?Diagnosis Date  ? Acute bronchitis 11/15/2017  ? ADENOCARCINOMA, BREAST 10/13/2010  ? Qualifier: Diagnosis of  By: Nils Pyle CMA Deborra Medina), Mearl Latin    ? 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 Allergy   ? SEASONAL  ? Anemia   ?  years ago  ? Anticoagulated by anticoagulation treatment - Eliquis 03/30/2014  ? Started May 2015   ? Anxiety state 10/13/2010  ? Qualifier: Diagnosis of  By: Nils Pyle CMA Deborra Medina), Mearl Latin    ? Arthritis   ? "all my joints" (03/31/2014)  ? Barrett's esophagus   ? Breast cancer ([place])   ? s/p right breast lumpectomy  ? CARDIAC MURMUR 10/13/2010  ? Qualifier: Diagnosis of  By: Nils Pyle CMA Deborra Medina), Mearl Latin    ? 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 Cervical cancer ([place])   ? Chronic cough 01/02/2017  ? GERD, hiatal hernia Doubt amio  ? COLONIC POLYPS, ADENOMATOUS, HX OF 10/13/2010  ? Qualifier: Diagnosis of  By: Nils Pyle CMA Deborra Medina), Mearl Latin    ? Degenerative disorder of eye 07/25/2012  ? Diabetes mellitus without complication ([place])   ? Dilated cardiomyopathy ([place]) 08/27/2018  ? DIVERTICULOSIS, COLON 10/13/2010  ? Qualifier: Diagnosis of  By: Nils Pyle CMA Deborra Medina), Mearl Latin    ? DJD (degenerative joint disease) of knee   ? Dysrhythmia   ? Afib  ? Echocardiogram 07/2020   ? Echo 9/21: EF 70-75, moderate LVH, GR 1 DD, RVSP 40.5 (mildly elevated), normal RVSF, moderate LAE, mild RAE, mild MR, trivial AI  ? EROSIVE ESOPHAGITIS 10/13/2010  ? Qualifier: Diagnosis of  By: Nils Pyle CMA Deborra Medina), Mearl Latin    ? Error, refractive, myopia 07/09/2012  ? Essential hypertension 10/13/2010  ? Qualifier: Diagnosis of  By: Nils Pyle CMA Deborra Medina), Mearl Latin    ? Exotropia, intermittent 07/09/2012  ? GERD 10/14/2010  ? Qualifier: Diagnosis of  By: Sharlett Iles MD Cline Cools R   ? GERD (gastroesophageal reflux disease)   ? Glaucoma   ? H/O hiatal hernia   ? Heart murmur   ? Hiatal hernia s/p robotic repair & fundoplication 7/86/7672 07/18/7095  ? Qualifier: Diagnosis of  By: Nils Pyle CMA Deborra Medina), Mearl Latin    ? History of laser assisted in situ keratomileusis 07/25/2012  ? Hx of transesophageal echocardiography (TEE) for monitoring   ? TEE (03/2014): No LAA clot, moderate LAE, core triatriatum type structure in RA with no stenosis, normal EF 55-60%, mild MR  ? HYPERCHOLESTEROLEMIA 10/13/2010  ?  Qualifier: Diagnosis of  By: Nils Pyle CMA Deborra Medina), Mearl Latin    ? Hypertension   ? Hypothyroidism, postsurgical   ? Iron Deficiency Anemia 10/14/2010  ? Qualifier: Diagnosis of  By: Sharlett Iles MD Byrd Hesselbach   ? Irritable bowel syndrome 10/13/2010  ? Qualifier: Diagnosis of  By: Nils Pyle CMA Deborra Medina), Mearl Latin    ? Lumbar stenosis with neurogenic claudication 04/20/2016  ? Malignant neoplasm of breast ([place]) 02/25/2016  ? Migraine   ? "last one was years ago" (03/31/2014)  ? MIGRAINE HEADACHE 10/13/2010  ? Qualifier: Diagnosis of  By: Nils Pyle CMA Deborra Medina), Mearl Latin    ? 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 Overview:  X(T)   ? OSA on CPAP   ? settings at 10-11   ? OSTEOARTHRITIS 10/13/2010  ? Qualifier: Diagnosis of  By: Nils Pyle CMA Deborra Medina), Mearl Latin    ? Persistent atrial fibrillation 03/31/2015  ? DLCO dropped from 100% in 2015-50% in 01/2017  ? Post-surgical hypothyroidism 04/09/2014  ? RECTAL FISSURE 10/13/2010  ? Qualifier: Diagnosis of  By: Nils Pyle CMA Deborra Medina), Mearl Latin    ? S/P Nissen fundoplication (without gastrostomy tube) procedure 05/25/2017  ? Sleep apnea   ? THYROID CANCER 10/13/2010  ? Qualifier: Diagnosis of  By: Nils Pyle CMA Deborra Medina), Mearl Latin    ? Thyroid cancer [facility])   ? s/p thyroidectomy  ? Type II diabetes mellitus ([place])   ? type II   ? ? ?Past Surgical History:  ?Procedure Laterality Date  ? ABDOMINAL HYSTERECTOMY    ? "partial"  ? BREAST BIOPSY Right   ? BREAST LUMPECTOMY Right   ? CARDIOVERSION N/A 03/30/2014  ? Procedure: CARDIOVERSION - BEDSIDE;  Surgeon: [name], MD;  Location: [place];  Service: Cardiovascular;  Laterality: N/A;  ? CARDIOVERSION N/A 03/31/2014  ? Procedure: CARDIOVERSION  (BEDSIDE) ;  Surgeon: Lelon Perla, MD;  Location: [place];  Service: Cardiovascular;  Laterality: N/A;  ? CARDIOVERSION N/A 04/27/2014  ? Procedure: CARDIOVERSION;  Surgeon: Darlin Coco, MD;  Location: [place];  Service: Cardiovascular;  Laterality: N/A;  ? CARDIOVERSION N/A 04/13/2015  ? Procedure: CARDIOVERSION;  Surgeon: Thayer Headings, MD;  Location: [place];  Service: Cardiovascular;  Laterality: N/A;  ? CARDIOVERSION N/A 01/08/2017  ? Procedure: CARDIOVERSION;  Surgeon: Sanda Klein, MD;  Location: [place] ENDOSCOPY;  Service: Cardiovascular;  Laterality: N/A;  ? CARDIOVERSION N/A 09/19/2017  ? Procedure: CARDIOVERSION;  Surgeon: [name], MD;  Location: [facility] ENDOSCOPY;  Service: Cardiovascular;  Laterality: N/A;  ? CARDIOVERSION N/A 10/25/2017  ? Procedure: CARDIOVERSION;  Surgeon: Thayer Headings, MD;  Location: [place];  Service: Cardiovascular;  Laterality: N/A;  ? CARDIOVERSION N/A 12/27/2018  ? Procedure: CARDIOVERSION;  Surgeon: [name], MD;  Location: [place];  Service: Cardiovascular;  Laterality: N/A;  ?  CARDIOVERSION N/A 05/20/2019  ? Procedure: CARDIOVERSION;  Surgeon: [name], MD;  Location: [place];  Service: Cardiovascular;  Laterality: N/A;  ? CARDIOVERSION N/A 12/10/2019  ? Procedure: CARDIOVERSION;  Surgeon: Pixie Casino, MD;  Location: [place];  Service: Cardiovascular;  Laterality: N/A;  ? CARDIOVERSION N/A 12/31/2019  ? Procedure: CARDIOVERSION;  Surgeon: Sanda Klein, MD;  Location: [place];  Service: Cardiovascular;  Laterality: N/A;  ? CARDIOVERSION N/A 10/21/2020  ? Procedure: CARDIOVERSION;  Surgeon: [name], MD;  Location: [place];  Service: Cardiovascular;  Laterality: N/A;  ? CARDIOVERSION N/A 12/03/2020  ? Procedure: CARDIOVERSION;  Surgeon: Jerline Pain, MD;  Location: [facility] ENDOSCOPY;  Service: Cardiovascular;  Laterality: N/A;  ? CARDIOVERSION N/A 07/11/2021  ? Procedure: CARDIOVERSION;  Surgeon: Pixie Casino, MD;  Location: [facility] ENDOSCOPY;  Service: Cardiovascular;  Laterality: N/A;  ? CARPAL TUNNEL RELEASE Right   ? CATARACT EXTRACTION W/ INTRAOCULAR LENS  IMPLANT, BILATERAL Bilateral   ? COLONOSCOPY    ? EXCISIONAL HEMORRHOIDECTOMY    ? INSERTION OF MESH N/A 05/25/2017  ? Procedure: INSERTION OF MESH;  Surgeon: Ralene Ok, MD;   Location: [facility];  Service: General;  Laterality: N/A;  ? TEE WITHOUT CARDIOVERSION N/A 03/30/2014  ? Procedure: TRANSESOPHAGEAL ECHOCARDIOGRAM (TEE);  Surgeon: [name], MD;  Location: [place];  Ser

## 2022-03-16 ENCOUNTER — Other Ambulatory Visit: Payer: Medicare PPO

## 2022-03-16 DIAGNOSIS — R634 Abnormal weight loss: Secondary | ICD-10-CM | POA: Diagnosis not present

## 2022-03-16 DIAGNOSIS — R152 Fecal urgency: Secondary | ICD-10-CM

## 2022-03-16 DIAGNOSIS — A09 Infectious gastroenteritis and colitis, unspecified: Secondary | ICD-10-CM | POA: Diagnosis not present

## 2022-03-16 LAB — IGA: Immunoglobulin A: 191 mg/dL (ref 70–320)

## 2022-03-16 LAB — TISSUE TRANSGLUTAMINASE, IGA: (tTG) Ab, IgA: <1 U/mL

## 2022-03-17 LAB — FECAL LACTOFERRIN, QUANT
Fecal Lactoferrin: POSITIVE — AB
MICRO NUMBER:: 13352123
SPECIMEN QUALITY:: ADEQUATE

## 2022-03-20 LAB — GI PROFILE, STOOL, PCR

## 2022-03-20 LAB — CLOSTRIDIUM DIFFICILE BY PCR: Toxigenic C. Difficile by PCR: NEGATIVE

## 2022-03-23 ENCOUNTER — Ambulatory Visit (HOSPITAL_COMMUNITY)
Admission: RE | Admit: 2022-03-23 | Discharge: 2022-03-23 | Disposition: A | Discharge status: Home / Self Care | Payer: Medicare PPO | Source: Ambulatory Visit | Attending: Physician Assistant | Admitting: Physician Assistant

## 2022-03-23 DIAGNOSIS — K769 Liver disease, unspecified: Secondary | ICD-10-CM | POA: Diagnosis not present

## 2022-03-23 DIAGNOSIS — K6389 Other specified diseases of intestine: Secondary | ICD-10-CM | POA: Diagnosis not present

## 2022-03-23 DIAGNOSIS — R634 Abnormal weight loss: Secondary | ICD-10-CM | POA: Insufficient documentation

## 2022-03-23 DIAGNOSIS — A09 Infectious gastroenteritis and colitis, unspecified: Secondary | ICD-10-CM | POA: Insufficient documentation

## 2022-03-23 DIAGNOSIS — R152 Fecal urgency: Secondary | ICD-10-CM | POA: Diagnosis not present

## 2022-03-23 DIAGNOSIS — N281 Cyst of kidney, acquired: Secondary | ICD-10-CM | POA: Diagnosis not present

## 2022-03-23 LAB — PANCREATIC ELASTASE, FECAL: Pancreatic Elastase-1, Stool: 149 mcg/g — ABNORMAL LOW

## 2022-03-23 MED ORDER — IOHEXOL 300 MG/ML  SOLN
100.0000 mL | Freq: Once | INTRAMUSCULAR | Status: AC | PRN
  Administered 2022-03-23: 100 mL via INTRAVENOUS

## 2022-03-23 MED ORDER — SODIUM CHLORIDE (PF) 0.9 % IJ SOLN
INTRAMUSCULAR | Status: AC
  Filled 2022-03-23: qty 50

## 2022-03-27 ENCOUNTER — Other Ambulatory Visit: Payer: Self-pay

## 2022-03-27 MED ORDER — PANCRELIPASE (LIP-PROT-AMYL) 36000-114000 UNITS PO CPEP: ORAL_CAPSULE | ORAL | 11 refills | Status: DC

## 2022-03-27 MED ORDER — LANSOPRAZOLE 30 MG PO CPDR: 30.0000 mg | DELAYED_RELEASE_CAPSULE | Freq: Two times a day (BID) | ORAL | 2 refills | Status: DC

## 2022-04-20 ENCOUNTER — Encounter: Payer: Self-pay | Admitting: Physician Assistant

## 2022-04-20 ENCOUNTER — Ambulatory Visit: Payer: Medicare PPO | Admitting: Physician Assistant

## 2022-04-20 VITALS — BP 118/64 | HR 73 | Ht 66.5 in | Wt 162.4 lb

## 2022-04-20 DIAGNOSIS — K8689 Other specified diseases of pancreas: Secondary | ICD-10-CM | POA: Diagnosis not present

## 2022-04-20 NOTE — Progress Notes (Signed)
Chief Complaint: Follow-up diarrhea and weight loss  HPI:    [name] is a 88 year old female with a past medical history of A-fib on Eliquis and breast cancer, Barrett's esophagus and multiple others known to Dr. Carlean Purl, who returns clinic today for follow-up of her diarrhea and weight loss.    03/04/2008 colonoscopy done for history of adenomatous polyps with diverticulosis and no polyps at that time.    06/09/2015 EGD with E3 columnar tongue seen at the GE junction, hiatal hernia and moderate nonerosive gastritis.  Pathology showed Barrett's with no dysplasia and no recall was placed due to age.    05/25/2017 surgical hiatal hernia repair and Nissen fundoplication.    03/15/2022 patient seen in clinic and described that for 4 to 5 months she had nothing but loose stools multiple times a day that were urgent and had a lot of accidents.  Also reported documented weight loss of around 20 pounds in the past 6 months.  At that time ordered a CT of the abdomen pelvis for further evaluation of weight loss.  Ordered stool studies and labs.  Also she was encouraged to try over-the-counter Imodium.    04/11/2022 CMP, celiac testing and CBC normal.  Pancreatic elastase did return with moderate pancreatic insufficiency and fecal lactoferrin was positive.  C. difficile and GI profile panel negative.    03/25/2022 CT the abdomen pelvis showed status post gastric fundoplication with corresponding improvement in a large hiatal hernia and small residual sliding-type hiatal hernia with bowel wall thickening, nonspecific and could reflect esophagitis.  She was started on Prevacid 30 mg twice a day to see if it helped with her symptoms given signs of esophagitis.  Stool studies returned with a decreased fecal pancreatic elastase and patient was started on Creon 36,000 units 2 with a meal monitor snack.    Today, the patient presents to clinic accompanied by her home health nurse and when asked she is actually doing much  better with the Creon supplementation.  She is taking 2 with a meal and 1 with a snack and has some questions about this but she is now only having 1-2 bowel movements a day and they are slightly formed and not as urgent.  She is no longer having any incontinence.  Things have greatly improved.  She does tell me that she thinks these pills are "making me sad", and wonders if she needs to be on them forever.  Her nurse reminds me that she was also taken off of her Sertraline when her PCP was trying to figure out what was causing diarrhea.    Denies fever, chills, continued weight loss or blood in her stool.  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   Allergy    SEASONAL   Anemia    years ago   Anticoagulated by anticoagulation treatment - Eliquis 03/30/2014   Started May 2015    Anxiety state 10/13/2010   Qualifier: Diagnosis of  By: Nils Pyle CMA (AAMA), Leisha     Arthritis    "all my joints" (03/31/2014)   Barrett's esophagus    Breast cancer [facility])    s/p right breast lumpectomy   CARDIAC MURMUR 10/13/2010   Qualifier: Diagnosis of  By: Nils Pyle CMA (AAMA), Leisha     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   Cervical cancer ([place])  Chronic cough 01/02/2017   GERD, hiatal hernia Doubt amio   COLONIC POLYPS, ADENOMATOUS, HX OF 10/13/2010   Qualifier: Diagnosis of  By: Nils Pyle CMA (AAMA), Leisha     Degenerative disorder of eye 07/25/2012   Diabetes mellitus without complication ([place])    Dilated cardiomyopathy ([place]) 08/27/2018   DIVERTICULOSIS, COLON 10/13/2010   Qualifier: Diagnosis of  By: Nils Pyle CMA (AAMA), Mearl Latin     DJD (degenerative joint disease) of knee    Dysrhythmia    Afib   Echocardiogram 07/2020    Echo 9/21: EF 70-75, moderate LVH, GR 1 DD, RVSP 40.5 (mildly elevated), normal RVSF, moderate LAE, mild RAE, mild MR,  trivial AI   EROSIVE ESOPHAGITIS 10/13/2010   Qualifier: Diagnosis of  By: Nils Pyle CMA (AAMA), Leisha     Error, refractive, myopia 07/09/2012   Essential hypertension 10/13/2010   Qualifier: Diagnosis of  By: Nils Pyle CMA (AAMA), Rueben Bash, intermittent 07/09/2012   GERD 10/14/2010   Qualifier: Diagnosis of  By: Sharlett Iles MD Cline Cools R    GERD (gastroesophageal reflux disease)    Glaucoma    H/O hiatal hernia    Heart murmur    Hiatal hernia s/p robotic repair & fundoplication 6/83/7290 21/11/1550   Qualifier: Diagnosis of  By: Nils Pyle CMA (AAMA), Mearl Latin     History of laser assisted in situ keratomileusis 07/25/2012   Hx of transesophageal echocardiography (TEE) for monitoring    TEE (03/2014): No LAA clot, moderate LAE, core triatriatum type structure in RA with no stenosis, normal EF 55-60%, mild MR   HYPERCHOLESTEROLEMIA 10/13/2010   Qualifier: Diagnosis of  By: Nils Pyle CMA (AAMA), Leisha     Hypertension    Hypothyroidism, postsurgical    Iron Deficiency Anemia 10/14/2010   Qualifier: Diagnosis of  By: Sharlett Iles MD Cline Cools R    Irritable bowel syndrome 10/13/2010   Qualifier: Diagnosis of  By: Nils Pyle CMA (AAMA), Leisha     Lumbar stenosis with neurogenic claudication 04/20/2016   Malignant neoplasm of breast ([place]) 02/25/2016   Migraine    "last one was years ago" (03/31/2014)   MIGRAINE HEADACHE 10/13/2010   Qualifier: Diagnosis of  By: Nils Pyle CMA (AAMA), Leisha     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   Overview:  X(T)    OSA on CPAP    settings at 10-11    OSTEOARTHRITIS 10/13/2010   Qualifier: Diagnosis of  By: Nils Pyle CMA (AAMA), Leisha     Persistent atrial fibrillation 03/31/2015   DLCO dropped from 100% in 2015-50% in 01/2017   Post-surgical hypothyroidism 04/09/2014   RECTAL FISSURE 10/13/2010   Qualifier: Diagnosis of  By: Nils Pyle CMA (AAMA), Mearl Latin     S/P Nissen fundoplication (without gastrostomy tube) procedure 05/25/2017   Sleep apnea    THYROID CANCER 10/13/2010    Qualifier: Diagnosis of  By: Nils Pyle CMA Deborra Medina), Mearl Latin     Thyroid cancer [facility])    s/p thyroidectomy   Type II diabetes mellitus ([place])    type II     Past Surgical History:  Procedure Laterality Date   ABDOMINAL HYSTERECTOMY     "partial"   BREAST BIOPSY Right    BREAST LUMPECTOMY Right    CARDIOVERSION N/A 03/30/2014   Procedure: CARDIOVERSION - BEDSIDE;  Surgeon: [name], MD;  Location: [place];  Service: Cardiovascular;  Laterality: N/A;   CARDIOVERSION N/A 03/31/2014   Procedure: CARDIOVERSION  (BEDSIDE) ;  Surgeon: Lelon Perla, MD;  Location: [place];  Service: Cardiovascular;  Laterality: N/A;   CARDIOVERSION N/A 04/27/2014   Procedure: CARDIOVERSION;  Surgeon: Darlin Coco, MD;  Location: [place];  Service: Cardiovascular;  Laterality: N/A;   CARDIOVERSION N/A 04/13/2015   Procedure: CARDIOVERSION;  Surgeon: Thayer Headings, MD;  Location: [place];  Service: Cardiovascular;  Laterality: N/A;   CARDIOVERSION N/A 01/08/2017   Procedure: CARDIOVERSION;  Surgeon: Sanda Klein, MD;  Location: [place];  Service: Cardiovascular;  Laterality: N/A;   CARDIOVERSION N/A 09/19/2017   Procedure: CARDIOVERSION;  Surgeon: [name], MD;  Location: [place];  Service: Cardiovascular;  Laterality: N/A;   CARDIOVERSION N/A 10/25/2017   Procedure: CARDIOVERSION;  Surgeon: Nahser, Wonda Cheng, MD;  Location: [place];  Service: Cardiovascular;  Laterality: N/A;   CARDIOVERSION N/A 12/27/2018   Procedure: CARDIOVERSION;  Surgeon: [name], MD;  Location: [place];  Service: Cardiovascular;  Laterality: N/A;   CARDIOVERSION N/A 05/20/2019   Procedure: CARDIOVERSION;  Surgeon: [name], MD;  Location: [place];  Service: Cardiovascular;  Laterality: N/A;   CARDIOVERSION N/A 12/10/2019   Procedure: CARDIOVERSION;  Surgeon: Pixie Casino, MD;  Location: [place];  Service: Cardiovascular;  Laterality: N/A;   CARDIOVERSION N/A 12/31/2019    Procedure: CARDIOVERSION;  Surgeon: Sanda Klein, MD;  Location: [place];  Service: Cardiovascular;  Laterality: N/A;   CARDIOVERSION N/A 10/21/2020   Procedure: CARDIOVERSION;  Surgeon: [name], MD;  Location: [place];  Service: Cardiovascular;  Laterality: N/A;   CARDIOVERSION N/A 12/03/2020   Procedure: CARDIOVERSION;  Surgeon: Jerline Pain, MD;  Location: [place];  Service: Cardiovascular;  Laterality: N/A;   CARDIOVERSION N/A 07/11/2021   Procedure: CARDIOVERSION;  Surgeon: Pixie Casino, MD;  Location: [facility];  Service: Cardiovascular;  Laterality: N/A;   CARPAL TUNNEL RELEASE Right    CATARACT EXTRACTION W/ INTRAOCULAR LENS  IMPLANT, BILATERAL Bilateral    COLONOSCOPY     EXCISIONAL HEMORRHOIDECTOMY     INSERTION OF MESH N/A 05/25/2017   Procedure: INSERTION OF MESH;  Surgeon: Ralene Ok, MD;  Location: [facility];  Service: General;  Laterality: N/A;   TEE WITHOUT CARDIOVERSION N/A 03/30/2014   Procedure: TRANSESOPHAGEAL ECHOCARDIOGRAM (TEE);  Surgeon: [name], MD;  Location: [facility] ENDOSCOPY;  Service: Cardiovascular;  Laterality: N/A;   THYROIDECTOMY     TONSILLECTOMY      Current Outpatient Medications  Medication Sig Dispense Refill   ACCU-CHEK GUIDE test strip      acetaminophen (TYLENOL) 650 MG CR tablet Take 650 mg by mouth 3 (three) times daily.     Blood Glucose Monitoring Suppl (ACCU-CHEK GUIDE ME) w/Device KIT      cetirizine (ZYRTEC) 10 MG tablet Take 10 mg by mouth daily.     cholecalciferol (VITAMIN D) 25 MCG (1000 UNIT) tablet Take 1,000 Units by mouth daily.     ELIQUIS 5 MG TABS tablet TAKE 1 TABLET TWICE DAILY 180 tablet 3   ferrous sulfate 325 (65 FE) MG tablet Take 325 mg by mouth daily with breakfast.     fluticasone (FLONASE) 50 MCG/ACT nasal spray Place 1 spray into both nostrils at bedtime as needed for allergies.      lansoprazole (PREVACID) 30 MG capsule Take 1 capsule (30 mg total) by mouth 2 (two) times daily  before a meal. 60 capsule 2   levothyroxine (SYNTHROID) 112 MCG tablet Take 112 mcg by mouth at bedtime. Middle of night     lipase/protease/amylase (CREON) 36000 UNITS CPEP capsule Take 2 capsules (72,000 Units total) by  mouth 3 (three) times daily with meals. May also take 1 capsule (36,000 Units total) as needed (with snacks). 240 capsule 11   LORazepam (ATIVAN) 1 MG tablet Take 1.5 mg by mouth at bedtime. Take additional 0.5 mg if needed     Lutein 6 MG CAPS Take 6 mg by mouth daily.      metFORMIN (GLUCOPHAGE-XR) 500 MG 24 hr tablet Take 500 mg by mouth 2 (two) times daily.     metoprolol tartrate (LOPRESSOR) 100 MG tablet Take 1 tablet (100 mg total) by mouth 2 (two) times daily. 180 tablet 1   Polyvinyl Alcohol-Povidone PF 1.4-0.6 % SOLN Place 1 drop into both eyes daily as needed (dry eyes).      rosuvastatin (CRESTOR) 10 MG tablet Take 10 mg by mouth at bedtime.     vitamin B-12 (CYANOCOBALAMIN) 500 MCG tablet Take 500 mcg by mouth daily.     zinc gluconate 50 MG tablet Take 50 mg by mouth daily.     sertraline (ZOLOFT) 50 MG tablet Take 50 mg by mouth daily. (Patient not taking: Reported on 04/20/2022)     No current facility-administered medications for this visit.    Allergies as of 04/20/2022 - Review Complete 04/20/2022  Allergen Reaction Noted   Penicillins Itching, Rash, and Other (See Comments)     Family History  Problem Relation Age of Onset   Prostate cancer Father    Diabetes Sister    Heart disease Sister    Heart attack Brother    Heart disease Brother    Diabetes Brother    Diabetes Sister    Diabetes Sister    Pancreatic cancer Sister    Diabetes Brother    Heart disease Brother    Diabetes Brother    Heart disease Brother    Diabetes Brother    Heart disease Brother    Diabetes Brother    Heart disease Brother    Diabetes Brother    Heart disease Brother    Diabetes Brother    Diabetes Child     Social History   Socioeconomic History   Marital  status: Divorced    Spouse name: Not on file   Number of children: 2   Years of education: Not on file   Highest education level: Not on file  Occupational History   Occupation: Retired    Comment: Education officer, museum  Tobacco Use   Smoking status: Never   Smokeless tobacco: Never  Scientific laboratory technician Use: Never used  Substance and Sexual Activity   Alcohol use: No    Alcohol/week: 0.0 standard drinks of alcohol   Drug use: No   Sexual activity: Never  Other Topics Concern   Not on file  Social History Narrative   Not on file   Social Determinants of Health   Financial Resource Strain: Not on file  Food Insecurity: Not on file  Transportation Needs: Not on file  Physical Activity: Not on file  Stress: Not on file  Social Connections: Not on file  Intimate Partner Violence: Not on file    Review of Systems:    Constitutional: No weight loss, fever or chills Cardiovascular: No chest pain Respiratory: No SOB Gastrointestinal: See HPI and otherwise negative   Physical Exam:  Vital signs: BP 118/64   Pulse 73   Ht 5' 6.5" (1.689 m)   Wt 162 lb 6.4 oz (73.7 kg)   LMP  (LMP Unknown)   SpO2 95%   BMI  25.82 kg/m    Constitutional:   Pleasant Elderly Caucasian female appears to be in NAD, Well developed, Well nourished, alert and cooperative  Respiratory: Respirations even and unlabored. Lungs clear to auscultation bilaterally.   No wheezes, crackles, or rhonchi.  Cardiovascular: Normal S1, S2. No MRG. Regular rate and rhythm. No peripheral edema, cyanosis or pallor.  Gastrointestinal:  Soft, nondistended, nontender. No rebound or guarding. Normal bowel sounds. No appreciable masses or hepatomegaly. Rectal:  Not performed.  Psychiatric: Oriented to person, place and time. Demonstrates good judgement and reason without abnormal affect or behaviors.  RELEVANT LABS AND IMAGING: CBC    Component Value Date/Time   WBC 5.3 03/15/2022 1014   RBC 4.00 03/15/2022 1014   HGB  12.7 03/15/2022 1014   HGB 13.3 01/10/2021 1133   HGB 12.6 04/14/2009 1107   HCT 38.0 03/15/2022 1014   HCT 39.2 01/10/2021 1133   HCT 36.9 04/14/2009 1107   PLT 158.0 03/15/2022 1014   PLT 193 01/10/2021 1133   MCV 95.1 03/15/2022 1014   MCV 93 01/10/2021 1133   MCV 89.5 04/14/2009 1107   MCH 31.5 07/05/2021 1524   MCHC 33.5 03/15/2022 1014   RDW 13.5 03/15/2022 1014   RDW 13.2 01/10/2021 1133   RDW 13.4 04/14/2009 1107   LYMPHSABS 0.9 03/15/2022 1014   LYMPHSABS 1.3 01/10/2021 1133   LYMPHSABS 1.0 04/14/2009 1107   MONOABS 0.5 03/15/2022 1014   MONOABS 0.4 04/14/2009 1107   EOSABS 0.2 03/15/2022 1014   EOSABS 0.3 01/10/2021 1133   BASOSABS 0.1 03/15/2022 1014   BASOSABS 0.0 01/10/2021 1133   BASOSABS 0.0 04/14/2009 1107    CMP     Component Value Date/Time   NA 140 03/15/2022 1014   NA 141 01/10/2021 1133   K 3.7 03/15/2022 1014   CL 104 03/15/2022 1014   CO2 29 03/15/2022 1014   GLUCOSE 130 (H) 03/15/2022 1014   BUN 21 03/15/2022 1014   BUN 17 01/10/2021 1133   CREATININE 0.69 03/15/2022 1014   CREATININE 0.98 (H) 11/01/2016 1043   CALCIUM 9.4 03/15/2022 1014   PROT 6.3 03/15/2022 1014   PROT 6.2 04/22/2018 1521   ALBUMIN 3.9 03/15/2022 1014   ALBUMIN 4.1 04/22/2018 1521   AST 11 03/15/2022 1014   ALT 8 03/15/2022 1014   ALKPHOS 51 03/15/2022 1014   BILITOT 0.6 03/15/2022 1014   BILITOT 0.2 04/22/2018 1521   GFRNONAA >60 08/25/2021 1423   GFRAA 90 12/23/2020 1328    Assessment: 1.  Pancreatic insufficiency: Thought to be causing her episodes of diarrhea lately, things have greatly improved since starting Creon 2 with a meal and 1 with a snack  Plan: 1.  Answered all the patient's questions today.  She needs to continue Creon 36,000 lipase units 2 with a meal and 1 with a snack.  We discussed this in detail today.  This would be something that she likely takes from now on. 2.  Discussed other questions with the patient, many of these are about medications  which were stopped while her PCP was trying to figure out what is causing diarrhea, Sertraline being one of them and the patient has noticed an increase in feeling "sad", discussed this is not from the Creon and more than likely from medications that were stopped.  She needs to call her PCP and make an appointment with them to further discuss. 3.  Patient to follow in clinic with [place] as needed.  Ellouise Newer, PA-C [facility] 04/20/2022,  2:00 PM  Cc: Lavone Orn, MD

## 2022-04-20 NOTE — Patient Instructions (Signed)
Continue Creon, 2 capsules with meals and 1 capsule with snacks.  Please contact your primary care physician for follow up.  Follow up with GI as needed.  If you are age 86 or older, your body mass index should be between 23-30. Your Body mass index is 25.82 kg/m. If this is out of the aforementioned range listed, please consider follow up with your Primary Care Provider.  If you are age 27 or younger, your body mass index should be between 19-25. Your Body mass index is 25.82 kg/m. If this is out of the aformentioned range listed, please consider follow up with your Primary Care Provider.   ________________________________________________________  The [place] [facility] providers would like to encourage you to use [facility] to communicate with providers for non-urgent requests or questions.  Due to long hold times on the telephone, sending your provider a message by [facility] may be a faster and more efficient way to get a response.  Please allow 48 business hours for a response.  Please remember that this is for non-urgent requests.  _______________________________________________________  Due to recent changes in healthcare laws, you may see the results of your imaging and laboratory studies on MyChart before your provider has had a chance to review them.  We understand that in some cases there may be results that are confusing or concerning to you. Not all laboratory results come back in the same time frame and the provider may be waiting for multiple results in order to interpret others.  Please give [place] 48 hours in order for your provider to thoroughly review all the results before contacting the office for clarification of your results.

## 2022-04-21 ENCOUNTER — Ambulatory Visit: Payer: Medicare PPO | Admitting: Physician Assistant

## 2022-05-08 ENCOUNTER — Telehealth (HOSPITAL_COMMUNITY): Payer: Self-pay | Admitting: *Deleted

## 2022-05-08 NOTE — Telephone Encounter (Signed)
Pt called in stating she would like to discuss watchman procedure. Will forward to Dr. Excell Seltzer office for office visit to discuss.

## 2022-05-10 DIAGNOSIS — E039 Hypothyroidism, unspecified: Secondary | ICD-10-CM | POA: Diagnosis not present

## 2022-05-26 ENCOUNTER — Ambulatory Visit: Payer: Medicare PPO | Admitting: Surgical

## 2022-05-26 DIAGNOSIS — M170 Bilateral primary osteoarthritis of knee: Secondary | ICD-10-CM

## 2022-05-28 ENCOUNTER — Encounter: Payer: Self-pay | Admitting: Surgical

## 2022-05-28 MED ORDER — LIDOCAINE HCL 1 % IJ SOLN
5.0000 mL | INTRAMUSCULAR | Status: AC | PRN
  Administered 2022-05-26: 5 mL

## 2022-05-28 MED ORDER — BUPIVACAINE HCL 0.25 % IJ SOLN
4.0000 mL | INTRAMUSCULAR | Status: AC | PRN
  Administered 2022-05-26: 4 mL via INTRA_ARTICULAR

## 2022-05-28 MED ORDER — METHYLPREDNISOLONE ACETATE 40 MG/ML IJ SUSP
40.0000 mg | INTRAMUSCULAR | Status: AC | PRN
  Administered 2022-05-26: 40 mg via INTRA_ARTICULAR

## 2022-05-28 NOTE — Progress Notes (Signed)
Office Visit Note   Patient: [name]           Date of Birth: 02-06-1931           MRN: [id number] Visit Date: 05/26/2022 Requested by: Lavone Orn, MD [address] [place],  [place] [address] PCP: Lavone Orn, MD  Subjective: Chief Complaint  Patient presents with   Right Knee - Pain   Left Knee - Pain    HPI: [name] is a 88 y.o. female who presents to the office complaining of bilateral knee pain.  Patient has history of bilateral knee osteoarthritis.  Last injections in both knees were in February 2023.  She recently fell in April causing recurrence of her knee pain.  She has been able to bear weight.  Denies any groin pain.  Takes Tylenol for pain.  She would like to repeat injections.  She does have history of diabetes but this is well controlled..                ROS: All systems reviewed are negative as they relate to the chief complaint within the history of present illness.  Patient denies fevers or chills.  Assessment & Plan: Visit Diagnoses:  1. Primary osteoarthritis of both knees     Plan: Patient is a 88 year old female who presents for evaluation of bilateral knee pain.  She has history of bilateral knee osteoarthritis.  Previous injections in February earlier this year provided great relief for her.  She would like to repeat these today.  Tolerated both injections well.  Follow-up with the office as needed with soonest repeat injections in 3 to 4 months.  Follow-Up Instructions: No follow-ups on file.   Orders:  No orders of the defined types were placed in this encounter.  No orders of the defined types were placed in this encounter.     Procedures: Large Joint Inj: bilateral knee on 05/26/2022 10:24 AM Indications: diagnostic evaluation, joint swelling and pain Details: 18 G 1.5 in needle, superolateral approach  Arthrogram: No  Medications (Right): 5 mL lidocaine 1 %; 4 mL bupivacaine 0.25 %; 40 mg methylPREDNISolone  acetate 40 MG/ML Medications (Left): 5 mL lidocaine 1 %; 4 mL bupivacaine 0.25 %; 40 mg methylPREDNISolone acetate 40 MG/ML Outcome: tolerated well, no immediate complications Procedure, treatment alternatives, risks and benefits explained, specific risks discussed. Consent was given by the patient. Immediately prior to procedure a time out was called to verify the correct patient, procedure, equipment, support staff and site/side marked as required. Patient was prepped and draped in the usual sterile fashion.       Clinical Data: No additional findings.  Objective: Vital Signs: LMP  (LMP Unknown)   Physical Exam:  Constitutional: Patient appears well-developed HEENT:  Head: Normocephalic Eyes:EOM are normal Neck: Normal range of motion Cardiovascular: Normal rate Pulmonary/chest: Effort normal Neurologic: Patient is alert Skin: Skin is warm Psychiatric: Patient has normal mood and affect  Ortho Exam: Ortho exam demonstrates bilateral knees without effusion.  No calf tenderness bilaterally.  Negative Homans' sign bilaterally.  No pain with hip range of motion bilaterally.  Tenderness over the medial lateral joint lines of both knees.  No cellulitis or skin changes noted over either knee.  She has range of motion from 3 degrees extension to 110 degrees of knee flexion bilaterally.  Specialty Comments:  No specialty comments available.  Imaging: No results found.   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   Post-surgical hypothyroidism 04/09/2014   Anticoagulated by anticoagulation treatment - Eliquis 03/30/2014   Diabetes mellitus without complication ([place])    Heart murmur    DJD (degenerative joint disease) of knee    Glaucoma    History of laser assisted in situ keratomileusis 07/25/2012   Degenerative disorder of eye 07/25/2012   Exotropia, intermittent 07/09/2012   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   Error, refractive, myopia 07/09/2012   ANEMIA, IRON DEFICIENCY 10/14/2010   GERD 10/14/2010   THYROID CANCER 10/13/2010   HYPERCHOLESTEROLEMIA 10/13/2010   ANEMIA, UNSPECIFIED 10/13/2010   Anxiety state 10/13/2010   MIGRAINE HEADACHE 10/13/2010   Essential hypertension 10/13/2010   EROSIVE ESOPHAGITIS 10/13/2010   BARRETTS ESOPHAGUS 10/13/2010   Hiatal hernia s/p robotic repair & fundoplication 0/96/0454 09/81/1914   DIVERTICULOSIS, COLON 10/13/2010   Irritable bowel syndrome 10/13/2010   OSTEOARTHRITIS 10/13/2010   CARDIAC MURMUR 10/13/2010   COLONIC POLYPS, ADENOMATOUS, HX OF 10/13/2010   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   Allergy    SEASONAL   Anemia    years ago   Anticoagulated by anticoagulation treatment - Eliquis 03/30/2014   Started May 2015    Anxiety state 10/13/2010   Qualifier: Diagnosis of  By: Nils Pyle CMA (AAMA), Leisha     Arthritis    "all my joints" (03/31/2014)   Barrett's esophagus    Breast cancer ([facility])    s/p right breast lumpectomy   CARDIAC MURMUR 10/13/2010   Qualifier: Diagnosis of  By: Nils Pyle CMA (AAMA), Leisha     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   Cervical cancer ([facility])    Chronic cough 01/02/2017   GERD, hiatal hernia Doubt amio   COLONIC POLYPS, ADENOMATOUS, HX  OF 10/13/2010   Qualifier: Diagnosis of  By: Nils Pyle CMA ([place]), Leisha     Degenerative disorder of eye 07/25/2012   Diabetes mellitus without complication ([place])    Dilated cardiomyopathy ([place]) 08/27/2018   DIVERTICULOSIS, COLON 10/13/2010   Qualifier: Diagnosis of  By: Nils Pyle CMA (AAMA), Leisha     DJD (degenerative joint disease) of knee    Dysrhythmia    Afib   Echocardiogram 07/2020    Echo 9/21: EF 70-75, moderate LVH, GR 1 DD, RVSP 40.5 (mildly elevated), normal RVSF, moderate LAE, mild RAE, mild MR, trivial AI   EROSIVE ESOPHAGITIS 10/13/2010   Qualifier: Diagnosis of  By: Nils Pyle CMA (AAMA), Leisha     Error, refractive, myopia 07/09/2012   Essential hypertension 10/13/2010   Qualifier: Diagnosis of  By: Nils Pyle CMA (AAMA), Rueben Bash, intermittent 07/09/2012   GERD 10/14/2010   Qualifier: Diagnosis of  By: Sharlett Iles MD Byrd Hesselbach    GERD (gastroesophageal reflux disease)    Glaucoma    H/O hiatal hernia    Heart murmur    Hiatal  hernia s/p robotic repair & fundoplication 4/85/4627 01/16/92   Qualifier: Diagnosis of  By: Nils Pyle CMA (AAMA), Mearl Latin     History of laser assisted in situ keratomileusis 07/25/2012   Hx of transesophageal echocardiography (TEE) for monitoring    TEE (03/2014): No LAA clot, moderate LAE, core triatriatum type structure in RA with no stenosis, normal EF 55-60%, mild MR   HYPERCHOLESTEROLEMIA 10/13/2010   Qualifier: Diagnosis of  By: Nils Pyle CMA (AAMA), Leisha     Hypertension    Hypothyroidism, postsurgical    Iron Deficiency Anemia 10/14/2010   Qualifier: Diagnosis of  By: Sharlett Iles MD Cline Cools R    Irritable bowel syndrome 10/13/2010   Qualifier: Diagnosis of  By: Nils Pyle CMA (AAMA), Leisha     Lumbar stenosis with neurogenic claudication 04/20/2016   Malignant neoplasm of breast ([place] [facility]) 02/25/2016   Migraine    "last one was years ago" (03/31/2014)   MIGRAINE HEADACHE 10/13/2010   Qualifier: Diagnosis of  By: Nils Pyle CMA (AAMA), Leisha      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   Overview:  X(T)    OSA on CPAP    settings at 10-11    OSTEOARTHRITIS 10/13/2010   Qualifier: Diagnosis of  By: Nils Pyle CMA (AAMA), Leisha     Persistent atrial fibrillation 03/31/2015   DLCO dropped from 100% in 2015-50% in 01/2017   Post-surgical hypothyroidism 04/09/2014   RECTAL FISSURE 10/13/2010   Qualifier: Diagnosis of  By: Nils Pyle CMA (AAMA), Mearl Latin     S/P Nissen fundoplication (without gastrostomy tube) procedure 05/25/2017   Sleep apnea    THYROID CANCER 10/13/2010   Qualifier: Diagnosis of  By: Nils Pyle CMA Deborra Medina), Mearl Latin     Thyroid cancer [facility])    s/p thyroidectomy   Type II diabetes mellitus ([place])    type II     Family History  Problem Relation Age of Onset   Prostate cancer Father    Diabetes Sister    Heart disease Sister    Heart attack Brother    Heart disease Brother    Diabetes Brother    Diabetes Sister    Diabetes Sister    Pancreatic cancer Sister    Diabetes Brother    Heart disease Brother    Diabetes Brother    Heart disease Brother    Diabetes Brother    Heart disease Brother    Diabetes Brother    Heart disease Brother    Diabetes Brother    Heart disease Brother    Diabetes Brother    Diabetes Child     Past Surgical History:  Procedure Laterality Date   ABDOMINAL HYSTERECTOMY     "partial"   BREAST BIOPSY Right    BREAST LUMPECTOMY Right    CARDIOVERSION N/A 03/30/2014   Procedure: CARDIOVERSION - BEDSIDE;  Surgeon: [name], MD;  Location: [facility];  Service: Cardiovascular;  Laterality: N/A;   CARDIOVERSION N/A 03/31/2014   Procedure: CARDIOVERSION  (BEDSIDE) ;  Surgeon: Lelon Perla, MD;  Location: [facility];  Service: Cardiovascular;  Laterality: N/A;   CARDIOVERSION N/A 04/27/2014   Procedure: CARDIOVERSION;  Surgeon: Darlin Coco, MD;  Location: [facility] ENDOSCOPY;  Service: Cardiovascular;  Laterality: N/A;   CARDIOVERSION N/A 04/13/2015   Procedure: CARDIOVERSION;  Surgeon: Thayer Headings, MD;   Location: [facility];  Service: Cardiovascular;  Laterality: N/A;   CARDIOVERSION N/A 01/08/2017   Procedure: CARDIOVERSION;  Surgeon: Sanda Klein, MD;  Location: [facility] ENDOSCOPY;  Service: Cardiovascular;  Laterality: N/A;   CARDIOVERSION N/A  09/19/2017   Procedure: CARDIOVERSION;  Surgeon: [name], MD;  Location: [facility] ENDOSCOPY;  Service: Cardiovascular;  Laterality: N/A;   CARDIOVERSION N/A 10/25/2017   Procedure: CARDIOVERSION;  Surgeon: Thayer Headings, MD;  Location: [place];  Service: Cardiovascular;  Laterality: N/A;   CARDIOVERSION N/A 12/27/2018   Procedure: CARDIOVERSION;  Surgeon: [name], MD;  Location: [place];  Service: Cardiovascular;  Laterality: N/A;   CARDIOVERSION N/A 05/20/2019   Procedure: CARDIOVERSION;  Surgeon: [name], MD;  Location: [place];  Service: Cardiovascular;  Laterality: N/A;   CARDIOVERSION N/A 12/10/2019   Procedure: CARDIOVERSION;  Surgeon: Pixie Casino, MD;  Location: [facility];  Service: Cardiovascular;  Laterality: N/A;   CARDIOVERSION N/A 12/31/2019   Procedure: CARDIOVERSION;  Surgeon: Sanda Klein, MD;  Location: [place];  Service: Cardiovascular;  Laterality: N/A;   CARDIOVERSION N/A 10/21/2020   Procedure: CARDIOVERSION;  Surgeon: [name], MD;  Location: [facility];  Service: Cardiovascular;  Laterality: N/A;   CARDIOVERSION N/A 12/03/2020   Procedure: CARDIOVERSION;  Surgeon: Jerline Pain, MD;  Location: [facility];  Service: Cardiovascular;  Laterality: N/A;   CARDIOVERSION N/A 07/11/2021   Procedure: CARDIOVERSION;  Surgeon: Pixie Casino, MD;  Location: [facility];  Service: Cardiovascular;  Laterality: N/A;   CARPAL TUNNEL RELEASE Right    CATARACT EXTRACTION W/ INTRAOCULAR LENS  IMPLANT, BILATERAL Bilateral    COLONOSCOPY     EXCISIONAL HEMORRHOIDECTOMY     INSERTION OF MESH N/A 05/25/2017   Procedure: INSERTION OF MESH;  Surgeon: Ralene Ok, MD;  Location: [facility];  Service: General;  Laterality: N/A;   TEE WITHOUT CARDIOVERSION N/A 03/30/2014   Procedure: TRANSESOPHAGEAL ECHOCARDIOGRAM (TEE);  Surgeon: [name], MD;  Location: [facility] ENDOSCOPY;  Service: Cardiovascular;  Laterality: N/A;   THYROIDECTOMY     TONSILLECTOMY     Social History   Occupational History   Occupation: Retired    Comment: Education officer, museum  Tobacco Use   Smoking status: Never   Smokeless tobacco: Never  Vaping Use   Vaping Use: Never used  Substance and Sexual Activity   Alcohol use: No    Alcohol/week: 0.0 standard drinks of alcohol   Drug use: No   Sexual activity: Never

## 2022-06-06 ENCOUNTER — Telehealth: Payer: Self-pay | Admitting: Physician Assistant

## 2022-06-06 NOTE — Telephone Encounter (Signed)
Inbound call from patient stating that she has been having abd pain and also has been having accidents in her pants. I offered Jennifer's next appointment 8/3 and she told me she wanted a call back from the nurse to discuss. Please advise.

## 2022-06-06 NOTE — Telephone Encounter (Signed)
Called and spoke with patient. She states that her stools were doing much better and were formed until about 2 days ago. Pt reports having liquid diarrhea for the past 2 days. Large volume stools. Pt reports significant weight loss, she weighed today and she is 150 lbs. Pt reports that "food does not taste good". She wanted to know if there was something that she could take to increase her appetite. She reports that her caregiver recommended Imodium, but she has not tried that yet. Pt has not recently been around anyone with similar symptoms and has not had any recent antibiotic use. Pt denies any changes in diet or medications. Pt denies any fever, she states that her temp was low when her caregiver took it this morning. Pt states that she told her caregiver that she "felt like I am dying". Pt is still taking Creon 2 capsules with each meal. Pt reports trying to eat 3 times a day, but would not call them meals. Please advise, thanks.

## 2022-06-06 NOTE — Telephone Encounter (Signed)
Called and spoke with patient regarding Jennifer's recommendations. Pt verbalized understanding and had no concerns at the end of the call. ?

## 2022-06-14 ENCOUNTER — Ambulatory Visit: Payer: Medicare PPO | Admitting: Cardiovascular Disease

## 2022-06-14 ENCOUNTER — Encounter: Payer: Self-pay | Admitting: Cardiovascular Disease

## 2022-06-14 VITALS — BP 110/70 | HR 109 | Ht 66.0 in | Wt 158.2 lb

## 2022-06-14 DIAGNOSIS — I5032 Chronic diastolic (congestive) heart failure: Secondary | ICD-10-CM | POA: Diagnosis not present

## 2022-06-14 DIAGNOSIS — I4821 Permanent atrial fibrillation: Secondary | ICD-10-CM

## 2022-06-14 NOTE — Patient Instructions (Signed)
Medication Instructions:  Your physician recommends that you continue on your current medications as directed. Please refer to the Current Medication list given to you today.  *If you need a refill on your cardiac medications before your next appointment, please call your pharmacy*   Lab Work: NONE If you have labs (blood work) drawn today and your tests are completely normal, you will receive your results only by: [place] (if you have MyChart) OR A paper copy in the mail If you have any lab test that is abnormal or we need to change your treatment, we will call you to review the results.   Testing/Procedures: NONE   Follow-Up: At [facility], you and your health needs are our priority.  As part of our continuing mission to provide you with exceptional heart care, we have created designated Provider Care Teams.  These Care Teams include your primary Cardiologist (physician) and Advanced Practice Providers (APPs -  Physician Assistants and Nurse Practitioners) who all work together to provide you with the care you need, when you need it.  We recommend signing up for the patient portal called "MyChart".  Sign up information is provided on this After Visit Summary.  MyChart is used to connect with patients for Virtual Visits (Telemedicine).  Patients are able to view lab/test results, encounter notes, upcoming appointments, etc.  Non-urgent messages can be sent to your provider as well.   To learn more about what you can do with MyChart, go to [email or web address].    Your next appointment:   6 month(s)  The format for your next appointment:   In Person  Provider:   Sherren Mocha, MD    Important Information About Sugar

## 2022-06-14 NOTE — Progress Notes (Signed)
Cardiology Office Note:    Date:  06/14/2022   ID:  [name], DOB May 30, 1931, MRN [id number]  PCP:  Lavone Orn, [place] [facility] Cardiologist:  Sherren Mocha, MD     Referring MD: Lavone Orn, MD   Chief Complaint  Patient presents with   Atrial Fibrillation    History of Present Illness:    [name] is a 88 y.o. female with a hx of persistent atrial fibrillation, presenting for follow-up evaluation.  She has been seen frequently in the atrial fib clinic over the last several years.  At the time of her evaluation in 2022, she was noted to be in persistent atrial fibrillation despite use of Tikosyn.  She had failed amiodarone in the past.  The decision was made to leave her in atrial fibrillation and proceed with a strategy of rate control.  Her metoprolol was increased antiques and was discontinued.  She presents today for follow-up evaluation.  She is here with a caregiver today.  She states that she mostly feels "sick" and has had problems with nausea and loose stools.  She also states that she feels sad much of the time after losing all of her siblings and being the last remaining sibling of 31.  She denies chest pain, chest pressure, orthopnea, PND, or heart palpitations.  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   Allergy    SEASONAL   Anemia    years ago   Anticoagulated by anticoagulation treatment - Eliquis 03/30/2014   Started May 2015    Anxiety state 10/13/2010   Qualifier: Diagnosis of  By: Nils Pyle CMA (AAMA), Leisha     Arthritis    "all my joints" (03/31/2014)   Barrett's esophagus    Breast cancer ([place])    s/p right breast lumpectomy   CARDIAC MURMUR 10/13/2010   Qualifier: Diagnosis of  By: Nils Pyle CMA (AAMA), Leisha     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    Cervical cancer ([place])    Chronic cough 01/02/2017   GERD, hiatal hernia Doubt amio   COLONIC POLYPS, ADENOMATOUS, HX OF 10/13/2010   Qualifier: Diagnosis of  By: Nils Pyle CMA (AAMA), Leisha     Degenerative disorder of eye 07/25/2012   Diabetes mellitus without complication ([place])    Dilated cardiomyopathy ([place]) 08/27/2018   DIVERTICULOSIS, COLON 10/13/2010   Qualifier: Diagnosis of  By: Nils Pyle CMA (AAMA), Leisha     DJD (degenerative joint disease) of knee    Dysrhythmia    Afib   Echocardiogram 07/2020    Echo 9/21: EF 70-75, moderate LVH, GR 1 DD, RVSP 40.5 (mildly elevated), normal RVSF, moderate LAE, mild RAE, mild MR, trivial AI   EROSIVE ESOPHAGITIS 10/13/2010   Qualifier: Diagnosis of  By: Nils Pyle CMA (AAMA), Leisha     Error, refractive, myopia 07/09/2012   Essential hypertension 10/13/2010   Qualifier: Diagnosis of  By: Nils Pyle CMA (AAMA), Rueben Bash, intermittent 07/09/2012   GERD 10/14/2010   Qualifier: Diagnosis of  By: Sharlett Iles MD Cline Cools R    GERD (gastroesophageal reflux disease)    Glaucoma    H/O hiatal hernia    Heart murmur    Hiatal hernia s/p robotic repair & fundoplication 9/62/2297 98/07/2118   Qualifier: Diagnosis of  By: Nils Pyle CMA (AAMA), Mearl Latin  History of laser assisted in situ keratomileusis 07/25/2012   Hx of transesophageal echocardiography (TEE) for monitoring    TEE (03/2014): No LAA clot, moderate LAE, core triatriatum type structure in RA with no stenosis, normal EF 55-60%, mild MR   HYPERCHOLESTEROLEMIA 10/13/2010   Qualifier: Diagnosis of  By: Nils Pyle CMA (AAMA), Leisha     Hypertension    Hypothyroidism, postsurgical    Iron Deficiency Anemia 10/14/2010   Qualifier: Diagnosis of  By: Sharlett Iles MD Cline Cools R    Irritable bowel syndrome 10/13/2010   Qualifier: Diagnosis of  By: Nils Pyle CMA (AAMA), Leisha     Lumbar stenosis with neurogenic claudication 04/20/2016   Malignant neoplasm of breast ([place]) 02/25/2016   Migraine    "last one was  years ago" (03/31/2014)   MIGRAINE HEADACHE 10/13/2010   Qualifier: Diagnosis of  By: Nils Pyle CMA (AAMA), Leisha     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   Overview:  X(T)    OSA on CPAP    settings at 10-11    OSTEOARTHRITIS 10/13/2010   Qualifier: Diagnosis of  By: Nils Pyle CMA (AAMA), Leisha     Persistent atrial fibrillation 03/31/2015   DLCO dropped from 100% in 2015-50% in 01/2017   Post-surgical hypothyroidism 04/09/2014   RECTAL FISSURE 10/13/2010   Qualifier: Diagnosis of  By: Nils Pyle CMA (AAMA), Mearl Latin     S/P Nissen fundoplication (without gastrostomy tube) procedure 05/25/2017   Sleep apnea    THYROID CANCER 10/13/2010   Qualifier: Diagnosis of  By: Nils Pyle CMA Deborra Medina), Mearl Latin     Thyroid cancer [facility])    s/p thyroidectomy   Type II diabetes mellitus ([place])    type II     Past Surgical History:  Procedure Laterality Date   ABDOMINAL HYSTERECTOMY     "partial"   BREAST BIOPSY Right    BREAST LUMPECTOMY Right    CARDIOVERSION N/A 03/30/2014   Procedure: CARDIOVERSION - BEDSIDE;  Surgeon: [name], MD;  Location: [place];  Service: Cardiovascular;  Laterality: N/A;   CARDIOVERSION N/A 03/31/2014   Procedure: CARDIOVERSION  (BEDSIDE) ;  Surgeon: Lelon Perla, MD;  Location: [place];  Service: Cardiovascular;  Laterality: N/A;   CARDIOVERSION N/A 04/27/2014   Procedure: CARDIOVERSION;  Surgeon: Darlin Coco, MD;  Location: [place];  Service: Cardiovascular;  Laterality: N/A;   CARDIOVERSION N/A 04/13/2015   Procedure: CARDIOVERSION;  Surgeon: Thayer Headings, MD;  Location: [place];  Service: Cardiovascular;  Laterality: N/A;   CARDIOVERSION N/A 01/08/2017   Procedure: CARDIOVERSION;  Surgeon: Sanda Klein, MD;  Location: [facility] ENDOSCOPY;  Service: Cardiovascular;  Laterality: N/A;   CARDIOVERSION N/A 09/19/2017   Procedure: CARDIOVERSION;  Surgeon: [name], MD;  Location: [place];  Service: Cardiovascular;  Laterality: N/A;   CARDIOVERSION N/A 10/25/2017    Procedure: CARDIOVERSION;  Surgeon: Nahser, Wonda Cheng, MD;  Location: [place];  Service: Cardiovascular;  Laterality: N/A;   CARDIOVERSION N/A 12/27/2018   Procedure: CARDIOVERSION;  Surgeon: [name], MD;  Location: [place];  Service: Cardiovascular;  Laterality: N/A;   CARDIOVERSION N/A 05/20/2019   Procedure: CARDIOVERSION;  Surgeon: [name], MD;  Location: [facility] ENDOSCOPY;  Service: Cardiovascular;  Laterality: N/A;   CARDIOVERSION N/A 12/10/2019   Procedure: CARDIOVERSION;  Surgeon: Pixie Casino, MD;  Location: [place];  Service: Cardiovascular;  Laterality: N/A;   CARDIOVERSION N/A 12/31/2019   Procedure: CARDIOVERSION;  Surgeon: Sanda Klein, MD;  Location: [facility] ENDOSCOPY;  Service: Cardiovascular;  Laterality: N/A;   CARDIOVERSION N/A  10/21/2020   Procedure: CARDIOVERSION;  Surgeon: [name], MD;  Location: [place];  Service: Cardiovascular;  Laterality: N/A;   CARDIOVERSION N/A 12/03/2020   Procedure: CARDIOVERSION;  Surgeon: Jerline Pain, MD;  Location: [place];  Service: Cardiovascular;  Laterality: N/A;   CARDIOVERSION N/A 07/11/2021   Procedure: CARDIOVERSION;  Surgeon: Pixie Casino, MD;  Location: [facility] ENDOSCOPY;  Service: Cardiovascular;  Laterality: N/A;   CARPAL TUNNEL RELEASE Right    CATARACT EXTRACTION W/ INTRAOCULAR LENS  IMPLANT, BILATERAL Bilateral    COLONOSCOPY     EXCISIONAL HEMORRHOIDECTOMY     INSERTION OF MESH N/A 05/25/2017   Procedure: INSERTION OF MESH;  Surgeon: Ralene Ok, MD;  Location: [facility];  Service: General;  Laterality: N/A;   TEE WITHOUT CARDIOVERSION N/A 03/30/2014   Procedure: TRANSESOPHAGEAL ECHOCARDIOGRAM (TEE);  Surgeon: [name], MD;  Location[facility] ENDOSCOPY;  Service: Cardiovascular;  Laterality: N/A;   THYROIDECTOMY     TONSILLECTOMY      Current Medications: Current Meds  Medication Sig   ACCU-CHEK GUIDE test strip    acetaminophen (TYLENOL) 650 MG CR tablet Take 650 mg by  mouth 3 (three) times daily.   Blood Glucose Monitoring Suppl (ACCU-CHEK GUIDE ME) w/Device KIT    cetirizine (ZYRTEC) 10 MG tablet Take 10 mg by mouth daily.   cholecalciferol (VITAMIN D) 25 MCG (1000 UNIT) tablet Take 1,000 Units by mouth daily.   ELIQUIS 5 MG TABS tablet TAKE 1 TABLET TWICE DAILY   ferrous sulfate 325 (65 FE) MG tablet Take 325 mg by mouth daily with breakfast.   fluticasone (FLONASE) 50 MCG/ACT nasal spray Place 1 spray into both nostrils at bedtime as needed for allergies.    lansoprazole (PREVACID) 30 MG capsule Take 1 capsule (30 mg total) by mouth 2 (two) times daily before a meal.   levothyroxine (SYNTHROID) 112 MCG tablet Take 112 mcg by mouth at bedtime. Middle of night   lipase/protease/amylase (CREON) 36000 UNITS CPEP capsule Take 2 capsules (72,000 Units total) by mouth 3 (three) times daily with meals. May also take 1 capsule (36,000 Units total) as needed (with snacks).   LORazepam (ATIVAN) 1 MG tablet Take 1.5 mg by mouth at bedtime. Take additional 0.5 mg if needed   Lutein 6 MG CAPS Take 6 mg by mouth daily.    metFORMIN (GLUCOPHAGE-XR) 500 MG 24 hr tablet Take 500 mg by mouth 2 (two) times daily.   metoprolol tartrate (LOPRESSOR) 100 MG tablet Take 1 tablet (100 mg total) by mouth 2 (two) times daily.   Polyvinyl Alcohol-Povidone PF 1.4-0.6 % SOLN Place 1 drop into both eyes daily as needed (dry eyes).    rosuvastatin (CRESTOR) 10 MG tablet Take 10 mg by mouth at bedtime.   sertraline (ZOLOFT) 50 MG tablet Take 50 mg by mouth daily.   vitamin B-12 (CYANOCOBALAMIN) 500 MCG tablet Take 500 mcg by mouth daily.   zinc gluconate 50 MG tablet Take 50 mg by mouth daily.     Allergies:   Penicillins   Social History   Socioeconomic History   Marital status: Divorced    Spouse name: Not on file   Number of children: 2   Years of education: Not on file   Highest education level: Not on file  Occupational History   Occupation: Retired    Comment: Surveyor, minerals  Tobacco Use   Smoking status: Never   Smokeless tobacco: Never  Vaping Use   Vaping Use: Never used  Substance and Sexual Activity   Alcohol use: No    Alcohol/week: 0.0 standard drinks of alcohol   Drug use: No   Sexual activity: Never  Other Topics Concern   Not on file  Social History Narrative   Not on file   Social Determinants of Health   Financial Resource Strain: Not on file  Food Insecurity: Not on file  Transportation Needs: Not on file  Physical Activity: Not on file  Stress: Not on file  Social Connections: Not on file     Family History: The patient's family history includes Diabetes in her brother, brother, brother, brother, brother, brother, brother, child, sister, sister, and sister; Heart attack in her brother; Heart disease in her brother, brother, brother, brother, brother, brother, and sister; Pancreatic cancer in her sister; Prostate cancer in her father.  ROS:   Please see the history of present illness.    All other systems reviewed and are negative.  EKGs/Labs/Other Studies Reviewed:    The following studies were reviewed today: Echo 2021:  1. Left ventricular ejection fraction, by estimation, is 70 to 75%. The  left ventricle has hyperdynamic function. The left ventricle has no  regional wall motion abnormalities. There is moderate left ventricular  hypertrophy. Left ventricular diastolic  parameters are consistent with Grade I diastolic dysfunction (impaired  relaxation).   2. Right ventricular systolic function is normal. The right ventricular  size is normal. There is mildly elevated pulmonary artery systolic  pressure.   3. Left atrial size was moderately dilated.   4. Right atrial size was mildly dilated.   5. The mitral valve is grossly normal. Mild mitral valve regurgitation.  No evidence of mitral stenosis. Severe mitral annular calcification.   6. The aortic valve is tricuspid. There is moderate calcification of the  aortic  valve. There is moderate thickening of the aortic valve. Aortic  valve regurgitation is trivial. No aortic stenosis is present. Aortic  regurgitation PHT measures 474 msec.   7. The inferior vena cava is normal in size with greater than 50%  respiratory variability, suggesting right atrial pressure of 3 mmHg.   EKG:  EKG is ordered today.  The ekg ordered today demonstrates atrial fibrillation 109 bpm, otherwise normal  Recent Labs: 08/25/2021: Magnesium 1.7; TSH 0.833 03/15/2022: ALT 8; BUN 21; Creatinine, Ser 0.69; Hemoglobin 12.7; Platelets 158.0; Potassium 3.7; Sodium 140  Recent Lipid Panel No results found for: "CHOL", "TRIG", "HDL", "CHOLHDL", "VLDL", "LDLCALC", "LDLDIRECT"   Risk Assessment/Calculations:    CHA2DS2-VASc Score = 5   This indicates a 7.2% annual risk of stroke. The patient's score is based upon: CHF History: 1 HTN History: 1 Diabetes History: 0 Stroke History: 0 Vascular Disease History: 0 Age Score: 2 Gender Score: 1          Physical Exam:    VS:  BP 110/70   Pulse (!) 109   Ht _0  (1.676 m)   Wt 158 lb 3.2 oz (71.8 kg)   LMP  (LMP Unknown)   SpO2 97%   BMI 25.53 kg/m     Wt Readings from Last 3 Encounters:  06/14/22 158 lb 3.2 oz (71.8 kg)  04/20/22 162 lb 6.4 oz (73.7 kg)  02/22/22 164 lb (74.4 kg)     GEN:  Well nourished, well developed pleasant elderly woman in no acute distress HEENT: Normal NECK: No JVD; No carotid bruits LYMPHATICS: No lymphadenopathy CARDIAC: Irregularly irregular, no murmurs, rubs, gallops RESPIRATORY:  Clear to auscultation without rales,  wheezing or rhonchi  ABDOMEN: Soft, non-tender, non-distended MUSCULOSKELETAL:  No edema; No deformity  SKIN: Warm and dry NEUROLOGIC:  Alert and oriented x 3 PSYCHIATRIC:  Normal affect   ASSESSMENT:    1. Chronic diastolic heart failure ([place])   2. Permanent atrial fibrillation ([facility])    PLAN:    In order of problems listed above:  Appears stable on current  medical program.  Blood pressure is well controlled.  No exam evidence of volume overload. We discussed management of her atrial fibrillation today.  She really does not have any other viable option then rate control.  She will continue on metoprolol 100 mg twice daily.  She is tolerating oral anticoagulation with apixaban.  Recent blood count is normal with a hemoglobin of 12.7.  She had some interest in the Watchman device for left atrial appendage occlusion as an alternative to anticoagulation.  I discussed the pros and cons of this with her.  At age 43, I think conservative treatment is most appropriate and we will continue her on apixaban 5 mg twice daily.  She will return for follow-up in 6 months.  No medication changes are made today.           Medication Adjustments/Labs and Tests Ordered: Current medicines are reviewed at length with the patient today.  Concerns regarding medicines are outlined above.  Orders Placed This Encounter  Procedures   EKG 12-Lead   No orders of the defined types were placed in this encounter.   Patient Instructions  Medication Instructions:  Your physician recommends that you continue on your current medications as directed. Please refer to the Current Medication list given to you today.  *If you need a refill on your cardiac medications before your next appointment, please call your pharmacy*   Lab Work: NONE If you have labs (blood work) drawn today and your tests are completely normal, you will receive your results only by: [place] (if you have MyChart) OR A paper copy in the mail If you have any lab test that is abnormal or we need to change your treatment, we will call you to review the results.   Testing/Procedures: NONE   Follow-Up: At [facility], you and your health needs are our priority.  As part of our continuing mission to provide you with exceptional heart care, we have created designated Provider Care Teams.  These  Care Teams include your primary Cardiologist (physician) and Advanced Practice Providers (APPs -  Physician Assistants and Nurse Practitioners) who all work together to provide you with the care you need, when you need it.  We recommend signing up for the patient portal called "MyChart".  Sign up information is provided on this After Visit Summary.  MyChart is used to connect with patients for Virtual Visits (Telemedicine).  Patients are able to view lab/test results, encounter notes, upcoming appointments, etc.  Non-urgent messages can be sent to your provider as well.   To learn more about what you can do with MyChart, go to [email or web address].    Your next appointment:   6 month(s)  The format for your next appointment:   In Person  Provider:   Sherren Mocha, MD    Important Information About Sugar         Signed, Sherren Mocha, MD  06/14/2022 5:29 PM    [place]

## 2022-06-19 DIAGNOSIS — H353112 Nonexudative age-related macular degeneration, right eye, intermediate dry stage: Secondary | ICD-10-CM | POA: Diagnosis not present

## 2022-06-19 DIAGNOSIS — H35453 Secondary pigmentary degeneration, bilateral: Secondary | ICD-10-CM | POA: Diagnosis not present

## 2022-06-19 DIAGNOSIS — H353121 Nonexudative age-related macular degeneration, left eye, early dry stage: Secondary | ICD-10-CM | POA: Diagnosis not present

## 2022-06-19 DIAGNOSIS — H35371 Puckering of macula, right eye: Secondary | ICD-10-CM | POA: Diagnosis not present

## 2022-06-19 DIAGNOSIS — H35363 Drusen (degenerative) of macula, bilateral: Secondary | ICD-10-CM | POA: Diagnosis not present

## 2022-06-19 DIAGNOSIS — H3122 Choroidal dystrophy (central areolar) (generalized) (peripapillary): Secondary | ICD-10-CM | POA: Diagnosis not present

## 2022-06-21 DIAGNOSIS — E039 Hypothyroidism, unspecified: Secondary | ICD-10-CM | POA: Diagnosis not present

## 2022-06-21 DIAGNOSIS — D6869 Other thrombophilia: Secondary | ICD-10-CM | POA: Diagnosis not present

## 2022-06-21 DIAGNOSIS — F320 Major depressive disorder, single episode, mild: Secondary | ICD-10-CM | POA: Diagnosis not present

## 2022-06-21 DIAGNOSIS — I10 Essential (primary) hypertension: Secondary | ICD-10-CM | POA: Diagnosis not present

## 2022-06-21 DIAGNOSIS — I482 Chronic atrial fibrillation, unspecified: Secondary | ICD-10-CM | POA: Diagnosis not present

## 2022-06-21 DIAGNOSIS — K8681 Exocrine pancreatic insufficiency: Secondary | ICD-10-CM | POA: Diagnosis not present

## 2022-06-21 DIAGNOSIS — F419 Anxiety disorder, unspecified: Secondary | ICD-10-CM | POA: Diagnosis not present

## 2022-06-21 DIAGNOSIS — Z23 Encounter for immunization: Secondary | ICD-10-CM | POA: Diagnosis not present

## 2022-06-21 DIAGNOSIS — E1142 Type 2 diabetes mellitus with diabetic polyneuropathy: Secondary | ICD-10-CM | POA: Diagnosis not present

## 2022-07-10 ENCOUNTER — Telehealth: Payer: Self-pay | Admitting: Physician Assistant

## 2022-07-10 NOTE — Telephone Encounter (Signed)
Spoke with pt and she is aware of recommendations per Ellouise Newer PA.

## 2022-07-10 NOTE — Telephone Encounter (Signed)
Patient and her caretaker called today stating that patient is complaining of being nauseated and having increased feelings of depression and sadness since increasing her dosage of Creon.  She is asking 1) if there is something else she can take besides the creon; 2) if she should decrease her dose back to 2 a day, and 3) can she take something like Pepto Bismol for the nausea she's experiencing?  Please call patient and advise.  Thank you.

## 2022-07-10 NOTE — Telephone Encounter (Signed)
See note below from pt, please advise.

## 2022-07-24 ENCOUNTER — Other Ambulatory Visit (HOSPITAL_COMMUNITY): Payer: Self-pay | Admitting: Nurse Practitioner

## 2022-09-01 ENCOUNTER — Other Ambulatory Visit (HOSPITAL_COMMUNITY): Payer: Self-pay | Admitting: Nurse Practitioner

## 2022-09-05 DIAGNOSIS — L0292 Furuncle, unspecified: Secondary | ICD-10-CM | POA: Diagnosis not present

## 2022-09-05 DIAGNOSIS — F419 Anxiety disorder, unspecified: Secondary | ICD-10-CM | POA: Diagnosis not present

## 2022-09-05 DIAGNOSIS — I10 Essential (primary) hypertension: Secondary | ICD-10-CM | POA: Diagnosis not present

## 2022-09-19 ENCOUNTER — Other Ambulatory Visit (HOSPITAL_COMMUNITY): Payer: Self-pay | Admitting: Nurse Practitioner

## 2022-11-16 ENCOUNTER — Telehealth: Payer: Self-pay | Admitting: Physician Assistant

## 2022-11-16 NOTE — Telephone Encounter (Signed)
Called and spoke with patient, Natasha Glass, CNA was not listed on her DPR. Patient states that she has been taking 2 Creon capsules with meals and 1 with snacks. She also states that she ran out of Imodium. I advised patient that the last time this happened [name] increased her Creon to 3 capsules with meals and advised her to use Imodium PRN. Pt gave me verbal permission to relay this information to [name]. I called [name] and provided her with the recommendations. They will try this for a week and if no improvement they will call back and they are aware that we may need to order stool studies for patient. Natasha Glass, CNA verbalized understanding and had no concerns at the end of the call.

## 2022-11-16 NOTE — Telephone Encounter (Signed)
PT has been on creon since June and still have 6-7 loose BM daily along with bowel incontinence. They want to know should she come in for OV or can there be an adjustment made to medication

## 2022-11-23 NOTE — Telephone Encounter (Signed)
Called and spoke with [name]. She states that patient did not increase her Creon, she states that when patient eats she does not consider them meals since its not a large amount, I informed her that pt should still be at least taking 1 with snacks. I informed [name] that we need to get patient in for an appt since she is having ongoing issues with diarrhea. We scheduled patient for a follow up on Thursday, 11/30/22 at 1:30 pm with Ellouise Newer, PA-C. [name] will call back if pt foes not want to come in for the appt. [name] is going to try her best to get patient to take 3 Creon with meals and 1 with a snack. I informed her that all pancreatic enzyme supplements come in pill form like Zenpep. [name] also stated that pt reported "pain from head to toe" to her PCP and patient stated that she did not know if it was from the Creon or her arthritis. Pt was advised by PCP to take 2 Tylenol BID to see if that helps, pt thought that was too much to take. I reassured [name] that Tylenol is pretty safe, but pt should avoid large amount of Aleve, Ibuprofen, and motrin. [name] was thankful for the information. [name] verbalized understanding of all information and will relay it to the patient.

## 2022-11-23 NOTE — Telephone Encounter (Signed)
Incoming call from [place] the case Freight forwarder at patient's PCP office requesting for the nurse to call Lorin Glass her caregiver states she has some questions regarding her bowel movements. Please advise [phone]

## 2022-11-30 ENCOUNTER — Ambulatory Visit: Payer: Medicare PPO | Admitting: Physician Assistant

## 2022-11-30 ENCOUNTER — Other Ambulatory Visit (HOSPITAL_COMMUNITY): Payer: Self-pay | Admitting: Nurse Practitioner

## 2022-12-07 ENCOUNTER — Ambulatory Visit (HOSPITAL_COMMUNITY)
Admission: RE | Admit: 2022-12-07 | Discharge: 2022-12-07 | Disposition: A | Discharge status: Home / Self Care | Payer: Medicare PPO | Source: Ambulatory Visit | Attending: Nurse Practitioner | Admitting: Nurse Practitioner

## 2022-12-07 ENCOUNTER — Encounter (HOSPITAL_COMMUNITY): Payer: Self-pay | Admitting: Nurse Practitioner

## 2022-12-07 VITALS — BP 84/62 | HR 82 | Ht 66.0 in | Wt 140.2 lb

## 2022-12-07 DIAGNOSIS — I10 Essential (primary) hypertension: Secondary | ICD-10-CM | POA: Diagnosis not present

## 2022-12-07 DIAGNOSIS — I4821 Permanent atrial fibrillation: Secondary | ICD-10-CM | POA: Diagnosis present

## 2022-12-07 DIAGNOSIS — R0781 Pleurodynia: Secondary | ICD-10-CM | POA: Diagnosis not present

## 2022-12-07 DIAGNOSIS — I4819 Other persistent atrial fibrillation: Secondary | ICD-10-CM

## 2022-12-07 DIAGNOSIS — Z7901 Long term (current) use of anticoagulants: Secondary | ICD-10-CM | POA: Diagnosis not present

## 2022-12-07 DIAGNOSIS — D6869 Other thrombophilia: Secondary | ICD-10-CM | POA: Diagnosis not present

## 2022-12-07 NOTE — Progress Notes (Addendum)
Primary Care Physician: Lavone Orn, MD Cardiologist: [name] Pulmonologist: Dr. Elsworth Soho GI: Dr. Dolphus Jenny [name] is a 89 y.o. female with a h/o afib that has been on amiodarone since June of 2015. She has had many breakthrough events, last one in February with successful cardioversion. She does feel very weak in afib. She went into afib around 2 weeks ago and a RN came out to her house on Friday and documented the afib.  Dr. Rayann Heman was called and left instructions to increase amio to 200 mg bid and stop losartan. Apparently her BP was low on that day.  She is in now in the afib clinc and she is rate controlled and BP is stable. She reports around 2-3 weeks ago she first had 2 teeth pulled and then a shot of cortisone in her knee. She hasn't felt well since. She usually requires a cardioversion to restore SR.    F/u in afib clinic 05/28/19 after successful cardioversion. She is in SR today and feels improved. She is back to Amio 200 mg daily.  F/u in afib clinic, 11/25/19, with feelings of not being well and being out of rhythm x one week. She has had an UTI and just finished a round of doxycycline. EKG shows atrial flutter  variable rate at 110 bpm.CHA2DS2-VASc Score = 6, continues eliquis 5 mg bid. 1   F/u in  afib clinic, 12/16/18. She had a successful cardioversion but unfortunately felt like it only lasted one day. She is in afib in the 90's. Natasha will try to increase BB and keep amio  at 200 mg a little longer to see if she may return to [place].   F/u  in afib clinic, 01/15/20.  F/u cardioversion 2/17. She was kept on her higher dose of BB at time of cardioversion as she did covert out but with PAC's. She saw [name] one week out and was staying in [place]. She  was left on amiodarone 200 mg bid until f/u here. She called the office earlier in the week and c/o of feeling weak. EKG shows  Sinus brady at 48 bpm. Natasha feel her bradycardia is the reason for her weakness and will titrate meds down  today.   F/u 01/22/20. She remains  in SR  in the low 60's but does not feel that much better.  Back on amiodarone 200 mg daily from  BID and lower dose of BB. Marland Kitchen She had some GI distress yesterday and recently had issues with UTI. She  denies urgency or frequency today.  F/u in afib clinic, 4/1, as pt has gone back into afib. Discussed with her Natasha feel that amiodarone is no longer effective and Natasha think it is best to stop drug at this point and rate control. If qt shortens off 3 month amiodarone wash out, currently at 512 ms then tikosyn may be an option down the line. She has multiple cardioversions with significant ERAF.  F/u in afib clinic, 4/14. She is feeling better off amiodarone and she is rate controlled in the 80's but would like to see her a little slower so will increase BB today. We can consider Tikosyn after  3 month amiodarone wash out if qt interval shortens.   03/10/21- She actually is feeling better and had the enegy to do more around her house and some light exercise. She is upset today as she had some diarrhea this am and had an accident in the clinic. Natasha feel  this is why her BP is higher today as she usually  does not have high BP. She feels it may the the plate of food her neighbor gave her last night. She is rate controlled at home in the 70's and 80's.   F/u in afib clinic, 6/29. She remains in rate controlled afib and states that she feels very good. She is exercising without any significant shortness of breath and is rate controlled. No excessive fluid retention. Natasha feel that amiodarone may have been contributing to fatigue and she feels better off drug. She remains on eliquis 5 mg bid for a CHA2DS2VASc score of 6.  F/u in afib clinic, 08/04/20. She  remains in rate controlled afib and initially tells me that she is exercising and feels great, but then tells me she  feels bad and would love to get back in rhythm. QT corrected around 430 ms. She has now been off amiodarone x 5 months.    F/u in afib clinic, 10/19/20,  as pt wants to be admitted for Tikosyn initiation. She  states that she feels terrible in afib. She has been living afib for several months . Natasha cut back her BB yesterday as she is slow in SR when she returns to SR and she is  not quite as well rate controlled today. She has been on 50 mg metoprolol bid with nice rate control.   She failed amiodarone after taking for years  and it was stopped late Spring after multiple cardioversion that would only hold short term.  She  has been off drug for sufficient period of time to try Tikosyn Her qtc today is 480 ms, but in SR with amiodarone on board was 460 ms, EKg  in spring 2015 without amio was 439 ms.  No benadryl use.  No missed anticoagulation. Electrolytes ok for admission with crcl cal at 64 so she will qualify for 500 msc. Covid negative. + vaccines.  F/u in afib clinic, 10/29/20,  one week after hospital admit for Tikosyn. Her qt prolonged and she was discharged on 125 mcg bid. She  did require cardioversion. She states today she feels improved. Her qt is 500 ms. Natasha will need to recheck qt early next week.   F/u in afib clinic, 11/26/20, as she reported to the office last week that she went back into aifb. She states that she got stressed over her son falling and breaking his knee and a covid outbreak in the family but she was not exposed.  Ekg today shows atrial flutter with RVR with v rates in the 130's. Despite this pt feels well.  Will schedule cardioversion. She continues on Tikosyn 125 mcg bid. No missed eliquis with CHA2DS2VASc score of 6.   Follow up in the AF clinic 06/13/21. Patient reports that she went back into afib a few days after calling to cancel her DCCV in 01/2021. She states she has been in afib for months. She went to see her PCP on 7/28 and was told her heart rates were elevated. It is unclear how symptomatic she is with her afib.   F/u in afib clinic 06/30/21. She remains in afib with RVR. We discussed  cardioversion vrs further adjustment of her meds. She would prefer to increase BB again and if the does not improve things then pursue CV.   F/u 07/05/21. Despite increase in BB, pt has not converted to SR and remains in afib, rate controlled. She feels better in SR, she has fatigue. With   her being on tikosyn, we will try one more cardioversion but if this fails to keep her in rhythm, we will need to discuss stopping tikosyn and living in afib. She  has failed amiodarone in the past as well.   F/u in afib clinic, 07/19/21. She had a successful cardioversion but went back into afib by the next am. She did not feel any better but noted by her heart rates. She also thinks she may have taken an extra Tikosyn today. We discussed  stopping her Tikosyn but she feels she may go back in. Natasha compromised and Natasha will bring back in 2 weeks and if still out of rhythm will stop Tikosyn and she will live in rate controlled afib. She will not take her Tikosyn this pm for possible extra dose at noon.   F/u in afib clinic, 08/05/21. SHe remains  in afib, rate controlled . We discussed stopping tikosyn as it as well is not keeping her in rhythm. Last cardioversion with ERAF.   F/u in afib clinic, 08/25/21. She remains in rate controlled afib. She is off [place]. Her biggest concern is that she has had around 5 watery stools a day for the last 2 weeks with bowel incontinence. A friend of hers told her to try metamucil and eat apples with the skin and this is the first day she had a formed stool.   Her weight is down several pounds.   F/u in the afib clinic, 02/22/22. She is in permanent afib. She is having issues with frequent diarrhea. PCP aware and stopped metamucil which has helped some this week. No blood in stool.   F/u in afib clinic, 12/06/22. Her BP is soft this am, but was rushing to get here and did not get to drink much. She is not lightheaded with this.  She lives in permanent afib and her v rates are well managed. She  reports that she turned last week quickly to answer the phone and fell. She did strike her head but no obvious face or head trauma, she did not go to the ED but has not had any h/a, nausea, blurred vision. She is c/o of soreness of rt upper rib area , that was  struck with fall.  She now has s sitter with her most days and is getting meals on wheels.   Today, she denies symptoms of chest pain, shortness of breath, orthopnea, PND, lower extremity edema, dizziness, presyncope, syncope, or neurologic sequela.  The patient is tolerating medications without difficulties and is otherwise without complaint today.   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   Allergy    SEASONAL   Anemia    years ago   Anticoagulated by anticoagulation treatment - Eliquis 03/30/2014   Started May 2015    Anxiety state 10/13/2010   Qualifier: Diagnosis of  By: Nils Pyle CMA (AAMA), Leisha     Arthritis    "all my joints" (03/31/2014)   Barrett's esophagus    Breast cancer ([place])    s/p right breast lumpectomy   CARDIAC MURMUR 10/13/2010   Qualifier: Diagnosis of  By: Nils Pyle CMA (AAMA), Leisha     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   Cervical cancer ([place])    Chronic cough 01/02/2017   GERD, hiatal hernia Doubt amio   COLONIC POLYPS, ADENOMATOUS, HX OF  10/13/2010   Qualifier: Diagnosis of  By: Nils Pyle CMA (AAMA), Leisha     Degenerative disorder of eye 07/25/2012   Diabetes mellitus without complication ([place])    Dilated cardiomyopathy ([place]) 08/27/2018   DIVERTICULOSIS, COLON 10/13/2010   Qualifier: Diagnosis of  By: Nils Pyle CMA (AAMA), Mearl Latin     DJD (degenerative joint disease) of knee    Dysrhythmia    Afib   Echocardiogram 07/2020    Echo 9/21: EF 70-75, moderate LVH, GR 1 DD, RVSP 40.5 (mildly elevated), normal RVSF, moderate LAE, mild RAE, mild  MR, trivial AI   EROSIVE ESOPHAGITIS 10/13/2010   Qualifier: Diagnosis of  By: Nils Pyle CMA (AAMA), Leisha     Error, refractive, myopia 07/09/2012   Essential hypertension 10/13/2010   Qualifier: Diagnosis of  By: Nils Pyle CMA (AAMA), Rueben Bash, intermittent 07/09/2012   GERD 10/14/2010   Qualifier: Diagnosis of  By: Sharlett Iles MD Cline Cools R    GERD (gastroesophageal reflux disease)    Glaucoma    H/O hiatal hernia    Heart murmur    Hiatal hernia s/p robotic repair & fundoplication 1/61/0960 45/02/980   Qualifier: Diagnosis of  By: Nils Pyle CMA (AAMA), Mearl Latin     History of laser assisted in situ keratomileusis 07/25/2012   Hx of transesophageal echocardiography (TEE) for monitoring    TEE (03/2014): No LAA clot, moderate LAE, core triatriatum type structure in RA with no stenosis, normal EF 55-60%, mild MR   HYPERCHOLESTEROLEMIA 10/13/2010   Qualifier: Diagnosis of  By: Nils Pyle CMA (AAMA), Leisha     Hypertension    Hypothyroidism, postsurgical    Iron Deficiency Anemia 10/14/2010   Qualifier: Diagnosis of  By: Sharlett Iles MD Cline Cools R    Irritable bowel syndrome 10/13/2010   Qualifier: Diagnosis of  By: Nils Pyle CMA (AAMA), Leisha     Lumbar stenosis with neurogenic claudication 04/20/2016   Malignant neoplasm of breast ([place]) 02/25/2016   Migraine    "last one was years ago" (03/31/2014)   MIGRAINE HEADACHE 10/13/2010   Qualifier: Diagnosis of  By: Nils Pyle CMA (AAMA), Leisha     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   Overview:  X(T)    OSA on CPAP    settings at 10-11    OSTEOARTHRITIS 10/13/2010   Qualifier: Diagnosis of  By: Nils Pyle CMA (AAMA), Leisha     Persistent atrial fibrillation 03/31/2015   DLCO dropped from 100% in 2015-50% in 01/2017   Post-surgical hypothyroidism 04/09/2014   RECTAL FISSURE 10/13/2010   Qualifier: Diagnosis of  By: Nils Pyle CMA (AAMA), Mearl Latin     S/P Nissen fundoplication (without gastrostomy tube) procedure 05/25/2017   Sleep apnea    THYROID CANCER 10/13/2010    Qualifier: Diagnosis of  By: Nils Pyle CMA Deborra Medina), Mearl Latin     Thyroid cancer [facility])    s/p thyroidectomy   Type II diabetes mellitus ([place])    type II    Past Surgical History:  Procedure Laterality Date   ABDOMINAL HYSTERECTOMY     "partial"   BREAST BIOPSY Right    BREAST LUMPECTOMY Right    CARDIOVERSION N/A 03/30/2014   Procedure: CARDIOVERSION - BEDSIDE;  Surgeon: [name], MD;  Location: [place];  Service: Cardiovascular;  Laterality: N/A;   CARDIOVERSION N/A 03/31/2014   Procedure: CARDIOVERSION  (BEDSIDE) ;  Surgeon: Lelon Perla, MD;  Location: [place];  Service: Cardiovascular;  Laterality: N/A;   CARDIOVERSION N/A 04/27/2014   Procedure: CARDIOVERSION;  Surgeon: Darlin Coco, MD;  Location:  [place] [facility];  Service: Cardiovascular;  Laterality: N/A;   CARDIOVERSION N/A 04/13/2015   Procedure: CARDIOVERSION;  Surgeon: Thayer Headings, MD;  Location: [place];  Service: Cardiovascular;  Laterality: N/A;   CARDIOVERSION N/A 01/08/2017   Procedure: CARDIOVERSION;  Surgeon: Sanda Klein, MD;  Location: [place];  Service: Cardiovascular;  Laterality: N/A;   CARDIOVERSION N/A 09/19/2017   Procedure: CARDIOVERSION;  Surgeon: [name], MD;  Location: [place];  Service: Cardiovascular;  Laterality: N/A;   CARDIOVERSION N/A 10/25/2017   Procedure: CARDIOVERSION;  Surgeon: Nahser, Wonda Cheng, MD;  Location: [place];  Service: Cardiovascular;  Laterality: N/A;   CARDIOVERSION N/A 12/27/2018   Procedure: CARDIOVERSION;  Surgeon: [name], MD;  Location: [place];  Service: Cardiovascular;  Laterality: N/A;   CARDIOVERSION N/A 05/20/2019   Procedure: CARDIOVERSION;  Surgeon: [name], MD;  Location: [place];  Service: Cardiovascular;  Laterality: N/A;   CARDIOVERSION N/A 12/10/2019   Procedure: CARDIOVERSION;  Surgeon: Pixie Casino, MD;  Location: [place];  Service: Cardiovascular;  Laterality: N/A;   CARDIOVERSION N/A 12/31/2019    Procedure: CARDIOVERSION;  Surgeon: Sanda Klein, MD;  Location: [place];  Service: Cardiovascular;  Laterality: N/A;   CARDIOVERSION N/A 10/21/2020   Procedure: CARDIOVERSION;  Surgeon: [name], MD;  Location: [place];  Service: Cardiovascular;  Laterality: N/A;   CARDIOVERSION N/A 12/03/2020   Procedure: CARDIOVERSION;  Surgeon: Jerline Pain, MD;  Location: [place];  Service: Cardiovascular;  Laterality: N/A;   CARDIOVERSION N/A 07/11/2021   Procedure: CARDIOVERSION;  Surgeon: Pixie Casino, MD;  Location: [facility];  Service: Cardiovascular;  Laterality: N/A;   CARPAL TUNNEL RELEASE Right    CATARACT EXTRACTION W/ INTRAOCULAR LENS  IMPLANT, BILATERAL Bilateral    COLONOSCOPY     EXCISIONAL HEMORRHOIDECTOMY     INSERTION OF MESH N/A 05/25/2017   Procedure: INSERTION OF MESH;  Surgeon: Ralene Ok, MD;  Location: [facility];  Service: General;  Laterality: N/A;   TEE WITHOUT CARDIOVERSION N/A 03/30/2014   Procedure: TRANSESOPHAGEAL ECHOCARDIOGRAM (TEE);  Surgeon: [name], MD;  Location: [facility];  Service: Cardiovascular;  Laterality: N/A;   THYROIDECTOMY     TONSILLECTOMY      Current Outpatient Medications  Medication Sig Dispense Refill   ACCU-CHEK GUIDE test strip      acetaminophen (TYLENOL) 650 MG CR tablet Take 650 mg by mouth 3 (three) times daily.     apixaban (ELIQUIS) 5 MG TABS tablet TAKE 1 TABLET TWICE DAILY 180 tablet 2   Blood Glucose Monitoring Suppl (ACCU-CHEK GUIDE ME) w/Device KIT      cetirizine (ZYRTEC) 10 MG tablet Take 10 mg by mouth daily.     cholecalciferol (VITAMIN D) 25 MCG (1000 UNIT) tablet Take 1,000 Units by mouth daily.     ferrous sulfate 325 (65 FE) MG tablet Take 325 mg by mouth daily with breakfast.     fluticasone (FLONASE) 50 MCG/ACT nasal spray Place 1 spray into both nostrils at bedtime as needed for allergies.      lansoprazole (PREVACID) 30 MG capsule Take 1 capsule (30 mg total) by mouth 2 (two)  times daily before a meal. 60 capsule 2   levothyroxine (SYNTHROID) 125 MCG tablet Take 125 mcg by mouth daily.     lipase/protease/amylase (CREON) 36000 UNITS CPEP capsule Take 2 capsules (72,000 Units total) by mouth 3 (three) times daily with meals. May also take 1 capsule (36,000 Units total) as needed (with snacks). 240 capsule  11   LORazepam (ATIVAN) 1 MG tablet Take 1.5 mg by mouth at bedtime. Take additional 0.5 mg if needed     Lutein 6 MG CAPS Take 6 mg by mouth daily.      metFORMIN (GLUCOPHAGE-XR) 500 MG 24 hr tablet Take 500 mg by mouth 2 (two) times daily.     metoprolol tartrate (LOPRESSOR) 100 MG tablet TAKE 1 TABLET(100 MG) BY MOUTH TWICE DAILY 180 tablet 3   Polyvinyl Alcohol-Povidone PF 1.4-0.6 % SOLN Place 1 drop into both eyes daily as needed (dry eyes).      rosuvastatin (CRESTOR) 10 MG tablet Take 10 mg by mouth at bedtime.     sertraline (ZOLOFT) 50 MG tablet Take 50 mg by mouth daily.     vitamin B-12 (CYANOCOBALAMIN) 500 MCG tablet Take 500 mcg by mouth daily.     zinc gluconate 50 MG tablet Take 50 mg by mouth daily.     No current facility-administered medications for this encounter.    Allergies  Allergen Reactions   Penicillins Itching, Rash and Other (See Comments)    Did it involve swelling of the face/tongue/throat, SOB, or low BP? No Did it involve sudden or severe rash/hives, skin peeling, or any reaction on the inside of your mouth or nose? No Did you need to seek medical attention at a hospital or doctor's office? No When did it last happen?    30+ years   If all above answers are "NO", may proceed with cephalosporin use.     Social History   Socioeconomic History   Marital status: Divorced    Spouse name: Not on file   Number of children: 2   Years of education: Not on file   Highest education level: Not on file  Occupational History   Occupation: Retired    Comment: Education officer, museum  Tobacco Use   Smoking status: Never   Smokeless tobacco:  Never  Scientific laboratory technician Use: Never used  Substance and Sexual Activity   Alcohol use: No    Alcohol/week: 0.0 standard drinks of alcohol   Drug use: No   Sexual activity: Never  Other Topics Concern   Not on file  Social History Narrative   Not on file   Social Determinants of Health   Financial Resource Strain: Not on file  Food Insecurity: Not on file  Transportation Needs: Not on file  Physical Activity: Not on file  Stress: Not on file  Social Connections: Not on file  Intimate Partner Violence: Not on file    Family History  Problem Relation Age of Onset   Prostate cancer Father    Diabetes Sister    Heart disease Sister    Heart attack Brother    Heart disease Brother    Diabetes Brother    Diabetes Sister    Diabetes Sister    Pancreatic cancer Sister    Diabetes Brother    Heart disease Brother    Diabetes Brother    Heart disease Brother    Diabetes Brother    Heart disease Brother    Diabetes Brother    Heart disease Brother    Diabetes Brother    Heart disease Brother    Diabetes Brother    Diabetes Child     ROS- All systems are reviewed and negative except as per the HPI above  Physical Exam: Vitals:   12/07/22 0954  BP: (!) 84/62  Pulse: 82  Weight: 63.6 kg  Height:  $'5\' 6"'N$  (1.676 m)   Wt Readings from Last 3 Encounters:  12/07/22 63.6 kg  06/14/22 71.8 kg  04/20/22 73.7 kg    Labs: Lab Results  Component Value Date   NA 140 03/15/2022   K 3.7 03/15/2022   CL 104 03/15/2022   CO2 29 03/15/2022   GLUCOSE 130 (H) 03/15/2022   BUN 21 03/15/2022   CREATININE 0.69 03/15/2022   CALCIUM 9.4 03/15/2022   MG 1.7 08/25/2021   Lab Results  Component Value Date   INR 1.2 10/21/2020   No results found for: "CHOL", "HDL", "LDLCALC", "TRIG"   GEN- The patient is a well appearing elderly female, alert and oriented x 3 today.   HEENT-head normocephalic, atraumatic, sclera clear, conjunctiva pink, hearing intact, trachea  midline. Lungs- Clear to ausculation bilaterally, normal work of breathing, rt upper rib tenderness, around 3rd rib area  on palpation and with movement Heart- irregular rate and rhythm, no murmurs, rubs or gallops  GI- soft, NT, ND, + BS Extremities- no clubbing, cyanosis, or edema MS- no significant deformity or atrophy Skin- no rash or lesion Psych- euthymic mood, full affect Neuro- strength and sensation are intact   EKG-  Vent. rate 82 BPM PR interval * ms QRS duration 106 ms QT/QTcB 420/490 ms P-R-T axes * 52 86 Atrial fibrillation Nonspecific ST abnormality Prolonged QT Abnormal ECG When compared with ECG of 22-Feb-2022 15:32, PREVIOUS ECG IS PRESENT   Epic records reviewed   Assessment and Plan: 1. Permanent afib  Previous failure of amiodarone.  Loaded on dofetilide 10/2020  and for the last couple of months Tikosyn failed to keep in rhythm, as well as multiple cardioversion's along the way She is now off Tikosyn  Rate controlled afib will be her treatment strategy going forward Continue metoprolol 100 mg bid  No change today   2. HTN Soft this am, encouraged to go home and increase water intake   3. CHA2DS2VASc score of 6 Continue eliquis 5 mg bid  She is appropriate dosed  4. Rt upper rib pain since fall  Will send for cxr now   She states she sees her PCP in 3 days  F/u here in 6 months   Butch Penny C. Guiliana Shor, [facility] [address] [place], [place] [address] [phone]

## 2022-12-08 ENCOUNTER — Other Ambulatory Visit: Payer: Self-pay

## 2022-12-08 MED ORDER — PANCRELIPASE (LIP-PROT-AMYL) 36000-114000 UNITS PO CPEP: ORAL_CAPSULE | ORAL | 0 refills | Status: DC

## 2022-12-08 NOTE — Telephone Encounter (Signed)
Creon refilled to [facility].

## 2022-12-11 ENCOUNTER — Encounter (HOSPITAL_COMMUNITY): Payer: Self-pay | Admitting: *Deleted

## 2022-12-11 ENCOUNTER — Other Ambulatory Visit: Payer: Self-pay

## 2022-12-11 ENCOUNTER — Inpatient Hospital Stay (HOSPITAL_COMMUNITY)
Admission: EM | Admit: 2022-12-11 | Discharge: 2022-12-14 | DRG: 641 | Disposition: A | Discharge status: Home Health | Payer: Medicare PPO | Attending: Internal Medicine | Admitting: Internal Medicine

## 2022-12-11 DIAGNOSIS — F32A Depression, unspecified: Secondary | ICD-10-CM | POA: Diagnosis present

## 2022-12-11 DIAGNOSIS — I10 Essential (primary) hypertension: Secondary | ICD-10-CM | POA: Diagnosis present

## 2022-12-11 DIAGNOSIS — R197 Diarrhea, unspecified: Secondary | ICD-10-CM | POA: Diagnosis present

## 2022-12-11 DIAGNOSIS — Z853 Personal history of malignant neoplasm of breast: Secondary | ICD-10-CM

## 2022-12-11 DIAGNOSIS — R531 Weakness: Secondary | ICD-10-CM

## 2022-12-11 DIAGNOSIS — K219 Gastro-esophageal reflux disease without esophagitis: Secondary | ICD-10-CM | POA: Diagnosis present

## 2022-12-11 DIAGNOSIS — R5381 Other malaise: Secondary | ICD-10-CM | POA: Insufficient documentation

## 2022-12-11 DIAGNOSIS — F05 Delirium due to known physiological condition: Secondary | ICD-10-CM | POA: Diagnosis not present

## 2022-12-11 DIAGNOSIS — Z79890 Hormone replacement therapy (postmenopausal): Secondary | ICD-10-CM

## 2022-12-11 DIAGNOSIS — Z833 Family history of diabetes mellitus: Secondary | ICD-10-CM

## 2022-12-11 DIAGNOSIS — Z79899 Other long term (current) drug therapy: Secondary | ICD-10-CM

## 2022-12-11 DIAGNOSIS — E785 Hyperlipidemia, unspecified: Secondary | ICD-10-CM | POA: Diagnosis present

## 2022-12-11 DIAGNOSIS — W19XXXA Unspecified fall, initial encounter: Secondary | ICD-10-CM | POA: Diagnosis present

## 2022-12-11 DIAGNOSIS — E119 Type 2 diabetes mellitus without complications: Secondary | ICD-10-CM | POA: Diagnosis present

## 2022-12-11 DIAGNOSIS — Z7984 Long term (current) use of oral hypoglycemic drugs: Secondary | ICD-10-CM

## 2022-12-11 DIAGNOSIS — F419 Anxiety disorder, unspecified: Secondary | ICD-10-CM | POA: Diagnosis present

## 2022-12-11 DIAGNOSIS — D509 Iron deficiency anemia, unspecified: Secondary | ICD-10-CM | POA: Diagnosis present

## 2022-12-11 DIAGNOSIS — J302 Other seasonal allergic rhinitis: Secondary | ICD-10-CM | POA: Diagnosis present

## 2022-12-11 DIAGNOSIS — M25511 Pain in right shoulder: Secondary | ICD-10-CM | POA: Diagnosis present

## 2022-12-11 DIAGNOSIS — R296 Repeated falls: Secondary | ICD-10-CM | POA: Diagnosis present

## 2022-12-11 DIAGNOSIS — I4819 Other persistent atrial fibrillation: Secondary | ICD-10-CM | POA: Diagnosis present

## 2022-12-11 DIAGNOSIS — Z8249 Family history of ischemic heart disease and other diseases of the circulatory system: Secondary | ICD-10-CM

## 2022-12-11 DIAGNOSIS — I4821 Permanent atrial fibrillation: Secondary | ICD-10-CM | POA: Diagnosis present

## 2022-12-11 DIAGNOSIS — E872 Acidosis, unspecified: Secondary | ICD-10-CM | POA: Diagnosis present

## 2022-12-11 DIAGNOSIS — Z90710 Acquired absence of both cervix and uterus: Secondary | ICD-10-CM

## 2022-12-11 DIAGNOSIS — E1165 Type 2 diabetes mellitus with hyperglycemia: Secondary | ICD-10-CM | POA: Diagnosis present

## 2022-12-11 DIAGNOSIS — Z7901 Long term (current) use of anticoagulants: Secondary | ICD-10-CM

## 2022-12-11 DIAGNOSIS — Z880 Allergy status to penicillin: Secondary | ICD-10-CM

## 2022-12-11 DIAGNOSIS — Z85850 Personal history of malignant neoplasm of thyroid: Secondary | ICD-10-CM

## 2022-12-11 DIAGNOSIS — E890 Postprocedural hypothyroidism: Secondary | ICD-10-CM | POA: Diagnosis present

## 2022-12-11 DIAGNOSIS — E876 Hypokalemia: Secondary | ICD-10-CM | POA: Diagnosis not present

## 2022-12-11 LAB — BASIC METABOLIC PANEL
Anion gap: 13 (ref 5–15)
BUN: 22 mg/dL (ref 8–23)
CO2: 21 mmol/L — ABNORMAL LOW (ref 22–32)
Calcium: 9 mg/dL (ref 8.9–10.3)
Chloride: 104 mmol/L (ref 98–111)
Creatinine, Ser: 0.85 mg/dL (ref 0.44–1.00)
GFR, Estimated: >60 mL/min (ref >60)
Glucose, Bld: 143 mg/dL — ABNORMAL HIGH (ref 70–99)
Potassium: 2.5 mmol/L — CL (ref 3.5–5.1)
Sodium: 138 mmol/L (ref 135–145)

## 2022-12-11 LAB — CBC
HCT: 33.7 % — ABNORMAL LOW (ref 36.0–46.0)
Hemoglobin: 11.9 g/dL — ABNORMAL LOW (ref 12.0–15.0)
MCH: 34.5 pg — ABNORMAL HIGH (ref 26.0–34.0)
MCHC: 35.3 g/dL (ref 30.0–36.0)
MCV: 97.7 fL (ref 80.0–100.0)
Platelets: 197 10*3/uL (ref 150–400)
RBC: 3.45 MIL/uL — ABNORMAL LOW (ref 3.87–5.11)
RDW: 13.3 % (ref 11.5–15.5)
WBC: 6.6 10*3/uL (ref 4.0–10.5)
nRBC: 0 % (ref 0.0–0.2)

## 2022-12-11 LAB — CBG MONITORING, ED: Glucose-Capillary: 147 mg/dL — ABNORMAL HIGH (ref 70–99)

## 2022-12-11 MED ORDER — POTASSIUM CHLORIDE CRYS ER 20 MEQ PO TBCR
40.0000 meq | EXTENDED_RELEASE_TABLET | Freq: Once | ORAL | Status: AC
  Administered 2022-12-12: 40 meq via ORAL
  Filled 2022-12-11: qty 2

## 2022-12-11 MED ORDER — POTASSIUM CHLORIDE 10 MEQ/100ML IV SOLN
10.0000 meq | INTRAVENOUS | Status: AC
  Administered 2022-12-12 (×3): 10 meq via INTRAVENOUS
  Filled 2022-12-11 (×3): qty 100

## 2022-12-11 NOTE — ED Provider Notes (Incomplete)
[place] Provider Note   CSN: [id number] Arrival date & time: 12/11/22  2210     History {Add pertinent medical, surgical, social history, OB history to HPI:1} No chief complaint on file.   [name] is a 89 y.o. female.  HPI     Home Medications Prior to Admission medications   Medication Sig Start Date End Date Taking? Authorizing Provider  ACCU-CHEK GUIDE test strip  01/09/20   [provider]  acetaminophen (TYLENOL) 650 MG CR tablet Take 650 mg by mouth 3 (three) times daily.    [provider]  apixaban (ELIQUIS) 5 MG TABS tablet TAKE 1 TABLET TWICE DAILY 09/19/22   Sherran Needs, NP  Blood Glucose Monitoring Suppl (ACCU-CHEK GUIDE ME) w/Device KIT  01/09/20   [provider]  cetirizine (ZYRTEC) 10 MG tablet Take 10 mg by mouth daily.    [provider]  cholecalciferol (VITAMIN D) 25 MCG (1000 UNIT) tablet Take 1,000 Units by mouth daily.    [provider]  ferrous sulfate 325 (65 FE) MG tablet Take 325 mg by mouth daily with breakfast.    [provider]  fluticasone (FLONASE) 50 MCG/ACT nasal spray Place 1 spray into both nostrils at bedtime as needed for allergies.     [provider]  lansoprazole (PREVACID) 30 MG capsule Take 1 capsule (30 mg total) by mouth 2 (two) times daily before a meal. 03/27/22   Lemmon, Lavone Nian, PA  levothyroxine (SYNTHROID) 125 MCG tablet Take 125 mcg by mouth daily. 11/21/22   [provider]  lipase/protease/amylase (CREON) 36000 UNITS CPEP capsule Take 2 capsules (72,000 Units total) by mouth 3 (three) times daily with meals. May also take 1 capsule (36,000 Units total) as needed (with snacks). 12/08/22   Levin Erp, PA  LORazepam (ATIVAN) 1 MG tablet Take 1.5 mg by mouth at bedtime. Take additional 0.5 mg if needed    [provider]  Lutein 6 MG CAPS Take 6 mg by mouth daily.     [provider]  metFORMIN (GLUCOPHAGE-XR) 500 MG 24 hr tablet Take 500 mg by mouth 2 (two) times daily.    [provider]  metoprolol tartrate (LOPRESSOR) 100 MG tablet TAKE 1 TABLET(100 MG) BY MOUTH TWICE DAILY 11/30/22   Sherran Needs, NP  Polyvinyl Alcohol-Povidone PF 1.4-0.6 % SOLN Place 1 drop into both eyes daily as needed (dry eyes).     [provider]  rosuvastatin (CRESTOR) 10 MG tablet Take 10 mg by mouth at bedtime.    [provider]  sertraline (ZOLOFT) 50 MG tablet Take 50 mg by mouth daily.    [provider]  vitamin B-12 (CYANOCOBALAMIN) 500 MCG tablet Take 500 mcg by mouth daily.    [provider]  zinc gluconate 50 MG tablet Take 50 mg by mouth daily.    [provider]      Allergies    Penicillins    Review of Systems   Review of Systems  Physical Exam Updated Vital Signs BP 137/87 (BP Location: Left Arm)   Pulse 74   Temp 97.6 F (36.4 C) (Oral)   Resp 18   LMP  (LMP Unknown)   SpO2 96%  Physical Exam  ED Results / Procedures / Treatments   Labs (all labs ordered are listed, but only abnormal results are displayed) Labs Reviewed - No data to display  EKG None  Radiology  No results found.  Procedures Procedures  {Document cardiac monitor, telemetry assessment procedure when appropriate:1}  Medications Ordered in ED Medications - No data to display  ED Course/ Medical Decision Making/ A&P   {   Click here for ABCD2, HEART and other calculatorsREFRESH Note before signing :1}                          Medical Decision Making  ***  {Document critical care time when appropriate:1} {Document review of labs and clinical decision tools ie heart score, Chads2Vasc2 etc:1}  {Document your independent review of radiology images, and any outside records:1} {Document your discussion with family members, caretakers, and with consultants:1} {Document social determinants of health affecting pt's  care:1} {Document your decision making why or why not admission, treatments were needed:1} Final Clinical Impression(s) / ED Diagnoses Final diagnoses:  None    Rx / DC Orders ED Discharge Orders     None

## 2022-12-11 NOTE — ED Notes (Signed)
Pt had bowel movement pta, pericare completed, new brief and linens placed .

## 2022-12-11 NOTE — ED Triage Notes (Signed)
Pt from home by EMS for fall. Reports getting up and the house was dark and became disoriented and fell. Pt is on Eliquis, denies hitting her head. Had a previous fall last week and was seen in the afib clinic. C/o L leg pain  and R shoulder pain from previous fall.

## 2022-12-12 ENCOUNTER — Emergency Department (HOSPITAL_COMMUNITY): Payer: Medicare PPO

## 2022-12-12 DIAGNOSIS — E890 Postprocedural hypothyroidism: Secondary | ICD-10-CM | POA: Diagnosis present

## 2022-12-12 DIAGNOSIS — D509 Iron deficiency anemia, unspecified: Secondary | ICD-10-CM | POA: Diagnosis present

## 2022-12-12 DIAGNOSIS — I4821 Permanent atrial fibrillation: Secondary | ICD-10-CM | POA: Diagnosis present

## 2022-12-12 DIAGNOSIS — R5381 Other malaise: Secondary | ICD-10-CM | POA: Diagnosis not present

## 2022-12-12 DIAGNOSIS — Z7984 Long term (current) use of oral hypoglycemic drugs: Secondary | ICD-10-CM | POA: Diagnosis not present

## 2022-12-12 DIAGNOSIS — E872 Acidosis, unspecified: Secondary | ICD-10-CM | POA: Diagnosis present

## 2022-12-12 DIAGNOSIS — M25511 Pain in right shoulder: Secondary | ICD-10-CM | POA: Diagnosis present

## 2022-12-12 DIAGNOSIS — J302 Other seasonal allergic rhinitis: Secondary | ICD-10-CM | POA: Diagnosis present

## 2022-12-12 DIAGNOSIS — Z90710 Acquired absence of both cervix and uterus: Secondary | ICD-10-CM | POA: Diagnosis not present

## 2022-12-12 DIAGNOSIS — R197 Diarrhea, unspecified: Secondary | ICD-10-CM | POA: Diagnosis present

## 2022-12-12 DIAGNOSIS — R296 Repeated falls: Secondary | ICD-10-CM | POA: Diagnosis present

## 2022-12-12 DIAGNOSIS — Z7901 Long term (current) use of anticoagulants: Secondary | ICD-10-CM | POA: Diagnosis not present

## 2022-12-12 DIAGNOSIS — K219 Gastro-esophageal reflux disease without esophagitis: Secondary | ICD-10-CM | POA: Diagnosis present

## 2022-12-12 DIAGNOSIS — Z79899 Other long term (current) drug therapy: Secondary | ICD-10-CM | POA: Diagnosis not present

## 2022-12-12 DIAGNOSIS — W19XXXA Unspecified fall, initial encounter: Secondary | ICD-10-CM | POA: Diagnosis not present

## 2022-12-12 DIAGNOSIS — E1165 Type 2 diabetes mellitus with hyperglycemia: Secondary | ICD-10-CM | POA: Diagnosis present

## 2022-12-12 DIAGNOSIS — E785 Hyperlipidemia, unspecified: Secondary | ICD-10-CM | POA: Diagnosis present

## 2022-12-12 DIAGNOSIS — Z8249 Family history of ischemic heart disease and other diseases of the circulatory system: Secondary | ICD-10-CM | POA: Diagnosis not present

## 2022-12-12 DIAGNOSIS — E119 Type 2 diabetes mellitus without complications: Secondary | ICD-10-CM | POA: Diagnosis present

## 2022-12-12 DIAGNOSIS — I10 Essential (primary) hypertension: Secondary | ICD-10-CM | POA: Diagnosis present

## 2022-12-12 DIAGNOSIS — F05 Delirium due to known physiological condition: Secondary | ICD-10-CM | POA: Diagnosis not present

## 2022-12-12 DIAGNOSIS — F419 Anxiety disorder, unspecified: Secondary | ICD-10-CM | POA: Diagnosis present

## 2022-12-12 DIAGNOSIS — Z79890 Hormone replacement therapy (postmenopausal): Secondary | ICD-10-CM | POA: Diagnosis not present

## 2022-12-12 DIAGNOSIS — Z853 Personal history of malignant neoplasm of breast: Secondary | ICD-10-CM | POA: Diagnosis not present

## 2022-12-12 DIAGNOSIS — E876 Hypokalemia: Principal | ICD-10-CM

## 2022-12-12 DIAGNOSIS — F32A Depression, unspecified: Secondary | ICD-10-CM | POA: Diagnosis present

## 2022-12-12 DIAGNOSIS — E039 Hypothyroidism, unspecified: Secondary | ICD-10-CM | POA: Diagnosis not present

## 2022-12-12 LAB — CBC
HCT: 33.5 % — ABNORMAL LOW (ref 36.0–46.0)
Hemoglobin: 11.8 g/dL — ABNORMAL LOW (ref 12.0–15.0)
MCH: 34.6 pg — ABNORMAL HIGH (ref 26.0–34.0)
MCHC: 35.2 g/dL (ref 30.0–36.0)
MCV: 98.2 fL (ref 80.0–100.0)
Platelets: 189 10*3/uL (ref 150–400)
RBC: 3.41 MIL/uL — ABNORMAL LOW (ref 3.87–5.11)
RDW: 13.5 % (ref 11.5–15.5)
WBC: 6 10*3/uL (ref 4.0–10.5)
nRBC: 0 % (ref 0.0–0.2)

## 2022-12-12 LAB — BASIC METABOLIC PANEL
Anion gap: 11 (ref 5–15)
BUN: 17 mg/dL (ref 8–23)
CO2: 24 mmol/L (ref 22–32)
Calcium: 8.6 mg/dL — ABNORMAL LOW (ref 8.9–10.3)
Chloride: 106 mmol/L (ref 98–111)
Creatinine, Ser: 0.62 mg/dL (ref 0.44–1.00)
GFR, Estimated: >60 mL/min (ref >60)
Glucose, Bld: 112 mg/dL — ABNORMAL HIGH (ref 70–99)
Potassium: 2.5 mmol/L — CL (ref 3.5–5.1)
Sodium: 141 mmol/L (ref 135–145)

## 2022-12-12 LAB — HEMOGLOBIN A1C
Hgb A1c MFr Bld: 5.9 % — ABNORMAL HIGH (ref 4.8–5.6)
Mean Plasma Glucose: 122.63 mg/dL

## 2022-12-12 LAB — URINALYSIS, ROUTINE W REFLEX MICROSCOPIC
Bacteria, UA: NONE SEEN
Bilirubin Urine: NEGATIVE
Glucose, UA: NEGATIVE mg/dL
Hgb urine dipstick: NEGATIVE
Ketones, ur: 5 mg/dL — AB
Leukocytes,Ua: NEGATIVE
Nitrite: NEGATIVE
Protein, ur: 30 mg/dL — AB
Specific Gravity, Urine: 1.018 (ref 1.005–1.030)
pH: 5 (ref 5.0–8.0)

## 2022-12-12 LAB — CBG MONITORING, ED
Glucose-Capillary: 105 mg/dL — ABNORMAL HIGH (ref 70–99)
Glucose-Capillary: 122 mg/dL — ABNORMAL HIGH (ref 70–99)
Glucose-Capillary: 161 mg/dL — ABNORMAL HIGH (ref 70–99)
Glucose-Capillary: 166 mg/dL — ABNORMAL HIGH (ref 70–99)
Glucose-Capillary: 118 mg/dL — ABNORMAL HIGH (ref 70–99)

## 2022-12-12 LAB — MAGNESIUM: Magnesium: 1.5 mg/dL — ABNORMAL LOW (ref 1.7–2.4)

## 2022-12-12 LAB — PHOSPHORUS: Phosphorus: 2.4 mg/dL — ABNORMAL LOW (ref 2.5–4.6)

## 2022-12-12 MED ORDER — LORAZEPAM 0.5 MG PO TABS
0.5000 mg | ORAL_TABLET | Freq: Every day | ORAL | Status: DC | PRN
  Administered 2022-12-12 – 2022-12-14 (×3): 0.5 mg via ORAL
  Filled 2022-12-12 (×4): qty 1

## 2022-12-12 MED ORDER — HALOPERIDOL LACTATE 5 MG/ML IJ SOLN
5.0000 mg | Freq: Four times a day (QID) | INTRAMUSCULAR | Status: AC | PRN
  Administered 2022-12-12: 5 mg via INTRAVENOUS
  Filled 2022-12-12: qty 1

## 2022-12-12 MED ORDER — INSULIN ASPART 100 UNIT/ML IJ SOLN
0.0000 [IU] | Freq: Three times a day (TID) | INTRAMUSCULAR | Status: DC
  Administered 2022-12-12 – 2022-12-13 (×3): 2 [IU] via SUBCUTANEOUS
  Administered 2022-12-13 (×2): 1 [IU] via SUBCUTANEOUS

## 2022-12-12 MED ORDER — LACTATED RINGERS IV SOLN
INTRAVENOUS | Status: DC
  Administered 2022-12-12: 75 mL/h via INTRAVENOUS

## 2022-12-12 MED ORDER — LEVOTHYROXINE SODIUM 25 MCG PO TABS
125.0000 ug | ORAL_TABLET | Freq: Every day | ORAL | Status: DC
  Administered 2022-12-12 – 2022-12-14 (×3): 125 ug via ORAL
  Filled 2022-12-12 (×3): qty 1

## 2022-12-12 MED ORDER — METOPROLOL TARTRATE 12.5 MG HALF TABLET
12.5000 mg | ORAL_TABLET | Freq: Two times a day (BID) | ORAL | Status: DC
  Administered 2022-12-12 – 2022-12-14 (×6): 12.5 mg via ORAL
  Filled 2022-12-12 (×7): qty 1

## 2022-12-12 MED ORDER — APIXABAN 5 MG PO TABS
5.0000 mg | ORAL_TABLET | Freq: Two times a day (BID) | ORAL | Status: DC
  Administered 2022-12-12 – 2022-12-14 (×4): 5 mg via ORAL
  Filled 2022-12-12 (×5): qty 1

## 2022-12-12 MED ORDER — LORATADINE 10 MG PO TABS
10.0000 mg | ORAL_TABLET | Freq: Every day | ORAL | Status: DC
  Administered 2022-12-12 – 2022-12-14 (×3): 10 mg via ORAL
  Filled 2022-12-12 (×3): qty 1

## 2022-12-12 MED ORDER — PANTOPRAZOLE SODIUM 20 MG PO TBEC
20.0000 mg | DELAYED_RELEASE_TABLET | Freq: Every day | ORAL | Status: DC
  Administered 2022-12-12 – 2022-12-14 (×3): 20 mg via ORAL
  Filled 2022-12-12 (×3): qty 1

## 2022-12-12 MED ORDER — SERTRALINE HCL 50 MG PO TABS
50.0000 mg | ORAL_TABLET | Freq: Every day | ORAL | Status: DC
  Administered 2022-12-12 – 2022-12-14 (×3): 50 mg via ORAL
  Filled 2022-12-12 (×3): qty 1

## 2022-12-12 MED ORDER — IPRATROPIUM-ALBUTEROL 0.5-2.5 (3) MG/3ML IN SOLN
3.0000 mL | Freq: Once | RESPIRATORY_TRACT | Status: AC
  Administered 2022-12-12: 3 mL via RESPIRATORY_TRACT
  Filled 2022-12-12: qty 3

## 2022-12-12 MED ORDER — ZINC SULFATE 220 (50 ZN) MG PO CAPS
220.0000 mg | ORAL_CAPSULE | Freq: Every day | ORAL | Status: DC
  Administered 2022-12-12 – 2022-12-14 (×3): 220 mg via ORAL
  Filled 2022-12-12 (×3): qty 1

## 2022-12-12 MED ORDER — POTASSIUM CHLORIDE 2 MEQ/ML IV SOLN
INTRAVENOUS | Status: DC
  Filled 2022-12-12: qty 1000

## 2022-12-12 MED ORDER — POLYVINYL ALCOHOL 1.4 % OP SOLN: 1.0000 [drp] | Freq: Every day | OPHTHALMIC | Status: DC | PRN

## 2022-12-12 MED ORDER — POTASSIUM CHLORIDE CRYS ER 20 MEQ PO TBCR: 40.0000 meq | EXTENDED_RELEASE_TABLET | ORAL | Status: DC

## 2022-12-12 MED ORDER — ROSUVASTATIN CALCIUM 5 MG PO TABS
10.0000 mg | ORAL_TABLET | Freq: Every day | ORAL | Status: DC
  Administered 2022-12-12 – 2022-12-13 (×2): 10 mg via ORAL
  Filled 2022-12-12 (×2): qty 2

## 2022-12-12 MED ORDER — MAGNESIUM SULFATE 2 GM/50ML IV SOLN
2.0000 g | Freq: Once | INTRAVENOUS | Status: AC
  Administered 2022-12-12: 2 g via INTRAVENOUS
  Filled 2022-12-12: qty 50

## 2022-12-12 MED ORDER — VITAMIN B-12 1000 MCG PO TABS
500.0000 ug | ORAL_TABLET | Freq: Every day | ORAL | Status: DC
  Administered 2022-12-12 – 2022-12-14 (×3): 500 ug via ORAL
  Filled 2022-12-12 (×3): qty 1

## 2022-12-12 MED ORDER — PANCRELIPASE (LIP-PROT-AMYL) 36000-114000 UNITS PO CPEP
36000.0000 [IU] | ORAL_CAPSULE | Freq: Three times a day (TID) | ORAL | Status: DC
  Administered 2022-12-12 – 2022-12-14 (×7): 36000 [IU] via ORAL
  Filled 2022-12-12 (×8): qty 1

## 2022-12-12 MED ORDER — FERROUS SULFATE 325 (65 FE) MG PO TABS
325.0000 mg | ORAL_TABLET | Freq: Every day | ORAL | Status: DC
  Administered 2022-12-12 – 2022-12-14 (×3): 325 mg via ORAL
  Filled 2022-12-12 (×3): qty 1

## 2022-12-12 MED ORDER — FLUTICASONE PROPIONATE 50 MCG/ACT NA SUSP: 1.0000 | Freq: Every evening | NASAL | Status: DC | PRN

## 2022-12-12 MED ORDER — POTASSIUM CHLORIDE CRYS ER 20 MEQ PO TBCR
40.0000 meq | EXTENDED_RELEASE_TABLET | Freq: Once | ORAL | Status: AC
  Administered 2022-12-12: 40 meq via ORAL
  Filled 2022-12-12: qty 2

## 2022-12-12 MED ORDER — INSULIN ASPART 100 UNIT/ML IJ SOLN
0.0000 [IU] | Freq: Every day | INTRAMUSCULAR | Status: DC
  Administered 2022-12-13: 1 [IU] via SUBCUTANEOUS

## 2022-12-12 MED ORDER — POTASSIUM PHOSPHATES 15 MMOLE/5ML IV SOLN
30.0000 mmol | Freq: Once | INTRAVENOUS | Status: AC
  Administered 2022-12-12: 30 mmol via INTRAVENOUS
  Filled 2022-12-12: qty 10

## 2022-12-12 MED ORDER — VITAMIN D 25 MCG (1000 UNIT) PO TABS
1000.0000 [IU] | ORAL_TABLET | Freq: Every day | ORAL | Status: DC
  Administered 2022-12-12 – 2022-12-14 (×3): 1000 [IU] via ORAL
  Filled 2022-12-12 (×3): qty 1

## 2022-12-12 MED ORDER — ACETAMINOPHEN 325 MG PO TABS
650.0000 mg | ORAL_TABLET | Freq: Four times a day (QID) | ORAL | Status: DC | PRN
  Administered 2022-12-12 – 2022-12-14 (×3): 650 mg via ORAL
  Filled 2022-12-12 (×3): qty 2

## 2022-12-12 NOTE — ED Notes (Signed)
Pt transported to 6N room 30 by this RN and NT Lovena Le. Pt yelling help all the way to the room. Bedside report given to accepting RN.

## 2022-12-12 NOTE — Hospital Course (Signed)
[name] is an 89 year old female with PMH permanent A-fib on Eliquis, DM II, HTN, HLD, hypothyroidism, IDA, chronic anxiety/depression who presented after a fall at home. She has been having ongoing intermittent falls associated with worsening weakness.  She does have a caregiver, CNA at home as well.  She has also been having ongoing diarrhea and is on Creon.  She does endorse some mild improvement in her diarrhea after starting Creon recently. She was admitted for electrolyte replacement and PT/OT evaluation.

## 2022-12-12 NOTE — Evaluation (Signed)
Physical Therapy Evaluation Patient Details Name: [name] MRN: [id number] DOB: 1931/06/02 Today's Date: 12/12/2022  History of Present Illness  Pt is an 89 y/o female presenting after a fall at home. No acute fx from fall. Admitted for hypokalemia. PMH: a fib, DM2, HTN, HLD, hypothyroidism, anemia, anxiety/depression.   Clinical Impression  [name] is 89 y.o. female admitted with above HPI and diagnosis. Patient is currently limited by functional impairments below (see PT problem list). Patient lives alone and is independent with RW for mobility in home at baseline. She has a home aid 2x/week for ~3 hours a day. Currently she is able to complete bed mobility and transfers with min guard for safety, pt taking extra time throughout. Pt ambulated ~70' this session with RW and intermittent min assist to steady balance and manage walker properly. Discussed concerns with mobility and fall risk and recommendation for increased assist to close to 24/7 on return home. Pt plans to discuss with her children and caregiver agency. Caregiver present and confirms she will help pt with creating plan for increased assist along with her family. Patient will benefit from continued skilled PT interventions to address impairments and progress independence with mobility, recommending HHPT. Acute PT will follow and progress as able.        Recommendations for follow up therapy are one component of a multi-disciplinary discharge planning process, led by the attending physician.  Recommendations may be updated based on patient status, additional functional criteria and insurance authorization.  Follow Up Recommendations Home health PT      Assistance Recommended at Discharge Frequent or constant Supervision/Assistance  Patient can return home with the following  A little help with walking and/or transfers;A little help with bathing/dressing/bathroom;Assistance with cooking/housework;Direct  supervision/assist for medications management;Assist for transportation;Help with stairs or ramp for entrance    Equipment Recommendations None recommended by PT  Recommendations for Other Services       Functional Status Assessment Patient has had a recent decline in their functional status and demonstrates the ability to make significant improvements in function in a reasonable and predictable amount of time.     Precautions / Restrictions Precautions Precautions: Fall Restrictions Weight Bearing Restrictions: No      Mobility  Bed Mobility Overal bed mobility: Needs Assistance Bed Mobility: Supine to Sit, Sit to Supine     Supine to sit: Min assist, HOB elevated Sit to supine: Min guard, HOB elevated   General bed mobility comments: pt using bed rail, able to pivot hips to EOB and raise trunk with guarding only. Pt able to lift bil LE's onto bed at EOS to return to supine, no physical assist.    Transfers Overall transfer level: Needs assistance Equipment used: Rolling walker (2 wheels) Transfers: Sit to/from Stand Sit to Stand: Min guard           General transfer comment: guarding for safety to rise and lower at [employer]. pt using single UE on RW and one on EOB to power up.    Ambulation/Gait Ambulation/Gait assistance: Min assist, Min guard Gait Distance (Feet): 70 Feet Assistive device: Rolling walker (2 wheels) Gait Pattern/deviations: Step-through pattern, Decreased stride length Gait velocity: decr     General Gait Details: min assist at start to steady balance and manage walker to navigate out of room to hallway. pt progressed to min guard with cues for safe position to walker and for posture to scan environment.  Stairs  Wheelchair Mobility    Modified Rankin (Stroke Patients Only)       Balance Overall balance assessment: Needs assistance Sitting-balance support: No upper extremity supported, Feet supported Sitting  balance-Leahy Scale: Fair     Standing balance support: Bilateral upper extremity supported, During functional activity Standing balance-Leahy Scale: Poor                               Pertinent Vitals/Pain Pain Assessment Pain Assessment: No/denies pain Pain Intervention(s): Monitored during session, Repositioned    Home Living Family/patient expects to be discharged to:: Private residence Living Arrangements: Alone Available Help at Discharge: Personal care attendant;Family;Friend(s);Available PRN/intermittently Type of Home: House Home Access: Stairs to enter Entrance Stairs-Rails: Left Entrance Stairs-Number of Steps: 4-5   Home Layout: One level Home Equipment: Animator (2 wheels);Tub bench Additional Comments: reports aide helps 2 days/week for 3 hours; says aide can provide increased hours if she needs; children live in [place] and [place] ([facility]); son lives in [place] but no one    Prior Function Prior Level of Function : Needs assist             Mobility Comments: walker for mobility in the home. pt rides stationary bike 3-4 times/week. ADLs Comments: aide assists with IADLs (cleaning, occasional assist w/ meals), can do ADLs w/o assist but aide will help with a sponge bath when she is there too (1x/wk) sits on commode for sponge bath. assist for transportation to stores, MD appts. has meals on wheels M-F for lunch. daughter brings food for microwave. aid helps with food when there.     Hand Dominance   Dominant Hand: Right    Extremity/Trunk Assessment   Upper Extremity Assessment Upper Extremity Assessment: Defer to OT evaluation    Lower Extremity Assessment Lower Extremity Assessment: Generalized weakness    Cervical / Trunk Assessment Cervical / Trunk Assessment: Kyphotic  Communication   Communication: HOH  Cognition Arousal/Alertness: Awake/alert Behavior During Therapy: WFL for tasks  assessed/performed Overall Cognitive Status: No family/caregiver present to determine baseline cognitive functioning                                 General Comments: WFL for basic tasks, difficult to fully assess due to [facility]; perseverating on need for hemmoroid cream and having BM        General Comments      Exercises     Assessment/Plan    PT Assessment Patient needs continued PT services  PT Problem List Decreased strength       PT Treatment Interventions DME instruction;Gait training;Stair training;Functional mobility training;Therapeutic activities;Therapeutic exercise;Balance training;Neuromuscular re-education;Patient/family education;Cognitive remediation    PT Goals (Current goals can be found in the Care Plan section)  Acute Rehab PT Goals Patient Stated Goal: stop falling but remain at home PT Goal Formulation: With patient Time For Goal Achievement: 12/26/22 Potential to Achieve Goals: Good    Frequency Min 3X/week     Co-evaluation               AM-PAC PT "6 Clicks" Mobility  Outcome Measure Help needed turning from your back to your side while in a flat bed without using bedrails?: A Little Help needed moving from lying on your back to sitting on the side of a flat bed without using bedrails?: A Little Help needed moving to  and from a bed to a chair (including a wheelchair)?: A Little Help needed standing up from a chair using your arms (e.g., wheelchair or bedside chair)?: A Little Help needed to walk in hospital room?: A Little Help needed climbing 3-5 steps with a railing? : A Lot 6 Click Score: 17    End of Session Equipment Utilized During Treatment: Gait belt Activity Tolerance: Patient tolerated treatment well Patient left: in bed;with call bell/phone within reach;with family/visitor present Nurse Communication: Mobility status PT Visit Diagnosis: Unsteadiness on feet (R26.81);Muscle weakness (generalized) (M62.81);Difficulty  in walking, not elsewhere classified (R26.2)    Time: 7209-1068 PT Time Calculation (min) (ACUTE ONLY): 23 min   Charges:   PT Evaluation $PT Eval Low Complexity: 1 Low PT Treatments $Gait Training: 8-22 mins        Verner Mould, DPT Acute Rehabilitation Services Office [phone]  12/12/22 1:39 PM

## 2022-12-12 NOTE — ED Notes (Signed)
On initial assessment pt was mildly confused. Was A&Ox3, disoriented to month but oriented to year. Pt's confusion worsened while in this RN's care. Pt was intermittent able to answer orientation questions correctly. Pt called 911 stating she had been kidnapped. Pt attempted to exit bed. Was reorient by staff without success. [facility] floor coverage, Dr. Glo Herring informed regarding pts confusion. No reports of confusion from prior RN. No documentation of dementia/alzheimer. No documentation in H&P of confusion. This RN attempted to contact family without success. New orders placed by MD.

## 2022-12-12 NOTE — ED Notes (Signed)
Patient transported to CT 

## 2022-12-12 NOTE — Progress Notes (Signed)
[facility] night cross cover note:  I was notified by RN that this patient remains confused, attempting to get out of bed, with these behaviors refractory to attempts at verbal redirection.  In the setting of associated interference with ongoing medical treatment posing potential harm to themself, particularly given her fall risk, including presentation to the emergency department for fall, I have placed order for as needed IV Haldol.    Babs Bertin, DO Hospitalist

## 2022-12-12 NOTE — Evaluation (Signed)
Occupational Therapy Evaluation Patient Details Name: [name] MRN: [id number] DOB: December 19, 1930 Today's Date: 12/12/2022   History of Present Illness Pt is an 89 y/o female presenting after a fall at home. No acute fx from fall. Admitted for hypokalemia. PMH: a fib, DM2, HTN, HLD, hypothyroidism, anemia, anxiety/depression.   Clinical Impression   PTA, pt lives alone, typically Modified Independent with ADLs/mobility using RW, along w/ aide support for IADLs. Pt presents now w/ minor deficits in standing balance, endurance (3/4 DOE w/ activity), and overall strength. Eval slightly limited by pt bowel urgency/continence issues. Overall, pt requires Min A for bed mobility and min guard-Min A for BSC transfers w/ RW. Pt requires Setup for UB ADL and Mod A for LB ADLs assessed in the ED. Anticipate w/ continued mobility that pt will improve and be able to return home with increased support from family and aide. If increased support not able to be provided, would rec SNF rehab.     Recommendations for follow up therapy are one component of a multi-disciplinary discharge planning process, led by the attending physician.  Recommendations may be updated based on patient status, additional functional criteria and insurance authorization.   Follow Up Recommendations  Home health OT (pending increased aide hours at DC; if increased support not able to be provided, will need to consider SNF)     Assistance Recommended at Discharge Intermittent Supervision/Assistance  Patient can return home with the following A little help with walking and/or transfers;A little help with bathing/dressing/bathroom;Assistance with cooking/housework;Assist for transportation;Help with stairs or ramp for entrance    Functional Status Assessment  Patient has had a recent decline in their functional status and demonstrates the ability to make significant improvements in function in a reasonable and predictable amount of  time.  Equipment Recommendations  BSC/3in1    Recommendations for Other Services       Precautions / Restrictions Precautions Precautions: Fall Restrictions Weight Bearing Restrictions: No      Mobility Bed Mobility Overal bed mobility: Needs Assistance Bed Mobility: Supine to Sit, Sit to Supine     Supine to sit: Min assist Sit to supine: Min assist   General bed mobility comments: assist to lift trunk and BLE back onto stretcher    Transfers Overall transfer level: Needs assistance Equipment used: Rolling walker (2 wheels) Transfers: Bed to chair/wheelchair/BSC, Sit to/from Stand Sit to Stand: Min guard, Min assist     Step pivot transfers: Min guard     General transfer comment: min guard from stretcher, Min A from lower BSC. min guard to pivot w/ RW      Balance Overall balance assessment: Needs assistance Sitting-balance support: No upper extremity supported, Feet supported Sitting balance-Leahy Scale: Fair     Standing balance support: Bilateral upper extremity supported, During functional activity Standing balance-Leahy Scale: Poor                             ADL either performed or assessed with clinical judgement   ADL Overall ADL's : Needs assistance/impaired Eating/Feeding: Set up   Grooming: Set up;Sitting   Upper Body Bathing: Set up;Sitting   Lower Body Bathing: Minimal assistance;Sit to/from stand   Upper Body Dressing : Set up   Lower Body Dressing: Moderate assistance;Sit to/from stand;Sitting/lateral leans Lower Body Dressing Details (indicate cue type and reason): assist for wrap around brief on stretcher; difficult to fully assess in ED room Toilet Transfer: Minimal assistance;Stand-pivot;BSC/3in1;Rolling walker (  2 wheels) Toilet Transfer Details (indicate cue type and reason): able to stand from stretcher easily and pivot to [facility] w/ RW; assist to stand from [facility] d/t lower height Toileting- Clothing Manipulation and  Hygiene: Moderate assistance;Sit to/from stand;Sitting/lateral lean Toileting - Clothing Manipulation Details (indicate cue type and reason): assist d/t bowel urgency; initially trialed bedpan d/t pt reports of urgency though no BM noted so prompted to try [facility] w/ pt success; assist for peri care       General ADL Comments: SOB and fatigued with ADLs, reports soreness not as bad as she initially thought from fall     Vision Ability to See in Adequate Light: 0 Adequate Patient Visual Report: No change from baseline Vision Assessment?: No apparent visual deficits     Perception     Praxis      Pertinent Vitals/Pain Pain Assessment Pain Assessment: Faces Faces Pain Scale: Hurts little more Pain Location: LLE Pain Descriptors / Indicators: Sore Pain Intervention(s): Monitored during session     Hand Dominance Right   Extremity/Trunk Assessment Upper Extremity Assessment Upper Extremity Assessment: Generalized weakness   Lower Extremity Assessment Lower Extremity Assessment: Defer to PT evaluation   Cervical / Trunk Assessment Cervical / Trunk Assessment: Kyphotic   Communication Communication Communication: HOH   Cognition Arousal/Alertness: Awake/alert Behavior During Therapy: WFL for tasks assessed/performed Overall Cognitive Status: No family/caregiver present to determine baseline cognitive functioning                                 General Comments: WFL for basic tasks, difficult to fully assess due to [facility]; perseverating on need for hemmoroid cream and having BM     General Comments  Reports family called, could not reach her and went over to check on her to find her on the floor. reports only being on floor for maybe 1.5 hours. Educated that medical alert necklace may be help w/ agreeing    Exercises     Shoulder Instructions      Home Living Family/patient expects to be discharged to:: Private residence Living Arrangements: Alone Available  Help at Discharge: Personal care attendant;Family;Friend(s);Available PRN/intermittently Type of Home: House Home Access: Stairs to enter [employer] of Steps: 4-5   Home Layout: One level     Bathroom Shower/Tub: Sponge bathes at baseline   Bathroom Toilet: Standard     Home Equipment: Animator (2 wheels)   Additional Comments: reports aide helps 2 days/week for 3 hours; says aide can provide increased hours if she needs; children live in [place]      Prior Functioning/Environment Prior Level of Function : Needs assist             Mobility Comments: walker for mobility in the home ADLs Comments: aide assists with IADLs (cleaning, occasional assist w/ meals), can do ADLs w/o assist but aide will help with a sponge bath when she is there too. assist for transportation to stores, MD appts        OT Problem List: Decreased strength;Decreased activity tolerance;Impaired balance (sitting and/or standing);Cardiopulmonary status limiting activity;Pain      OT Treatment/Interventions: Self-care/ADL training;Therapeutic exercise;Energy conservation;DME and/or AE instruction;Therapeutic activities;Patient/family education    OT Goals(Current goals can be found in the care plan section) Acute Rehab OT Goals Patient Stated Goal: for bottom to feel better OT Goal Formulation: With patient Time For Goal Achievement: 12/26/22 Potential to Achieve Goals: Good  ADL Goals Pt Will Transfer to Toilet: with modified independence;ambulating Pt Will Perform Toileting - Clothing Manipulation and hygiene: with modified independence;sitting/lateral leans;sit to/from stand Additional ADL Goal #1: Pt to increase standing activity tolerance > 7 min during ADLs/mobility without rest break Additional ADL Goal #2: Pt to verbalize at least 3 fall prevention strategies to implement at home  OT Frequency: Min 2X/week    Co-evaluation               AM-PAC OT "6 Clicks" Daily Activity     Outcome Measure Help from another person eating meals?: None Help from another person taking care of personal grooming?: A Little Help from another person toileting, which includes using toliet, bedpan, or urinal?: A Lot Help from another person bathing (including washing, rinsing, drying)?: A Little Help from another person to put on and taking off regular upper body clothing?: A Little Help from another person to put on and taking off regular lower body clothing?: A Lot 6 Click Score: 17   End of Session Equipment Utilized During Treatment: Gait belt;Rolling walker (2 wheels) Nurse Communication: Mobility status;Other (comment) (hem cream request)  Activity Tolerance: Patient tolerated treatment well;Patient limited by fatigue Patient left: in bed;with call bell/phone within reach  OT Visit Diagnosis: Unsteadiness on feet (R26.81);Other abnormalities of gait and mobility (R26.89);Muscle weakness (generalized) (M62.81)                Time: 6834-1962 OT Time Calculation (min): 31 min Charges:  OT General Charges $OT Visit: 1 Visit OT Evaluation $OT Eval Low Complexity: 1 Low OT Treatments $Self Care/Home Management : 8-22 mins  Malachy Chamber, OTR/L Acute Rehab Services Office: [phone]   Layla Maw 12/12/2022, 9:04 AM

## 2022-12-12 NOTE — ED Notes (Signed)
Pt confusion continued to worsen. Pt began to interfering with care. Pt refused night time medications. Pt pulled and removed VS monitoring system. Pt began to attempt to remove IV. IVF stopped. Pt refused blood draw at this time. Admitting MD informed. Orders for PRN haldol placed and given per order.

## 2022-12-12 NOTE — ED Notes (Signed)
Pt updated regarding await room assignment. IVF running. Pt remains on VS monitor. Pur[place] in place. Call bell within reach. Pt denies any additional needs at this time.

## 2022-12-12 NOTE — ED Provider Notes (Signed)
[facility] Provider Note   CSN: [id number] Arrival date & time: 12/11/22  2210     History  Chief Complaint  Patient presents with   Weakness   Fall    [name] is a 89 y.o. female.  HPI     This is an 89 year old female who presents following a fall.  Patient reports that she was walking from 1 end of the house to the other and had turned off the lights.  She became disoriented.  She states she was unable to figure out where she was.  She ultimately lowered herself to the floor.  She states that she did not truly fall but landed on her buttock.  She is on Eliquis.  She denies hitting her head or loss of consciousness.  Since the fall she is having some left leg pain.  She also previously fell and was having some right shoulder pain.  CNA is at the bedside.  States that she was in her normal state of health prior to this.  No weakness, numbness, focal deficits noted.  Has had several frequent falls.  Home Medications Prior to Admission medications   Medication Sig Start Date End Date Taking? Authorizing Provider  ACCU-CHEK GUIDE test strip  01/09/20   [provider]  acetaminophen (TYLENOL) 650 MG CR tablet Take 650 mg by mouth 3 (three) times daily.    [provider]  apixaban (ELIQUIS) 5 MG TABS tablet TAKE 1 TABLET TWICE DAILY 09/19/22   Sherran Needs, NP  Blood Glucose Monitoring Suppl (ACCU-CHEK GUIDE ME) w/Device KIT  01/09/20   [provider]  cetirizine (ZYRTEC) 10 MG tablet Take 10 mg by mouth daily.    [provider]  cholecalciferol (VITAMIN D) 25 MCG (1000 UNIT) tablet Take 1,000 Units by mouth daily.    [provider]  ferrous sulfate 325 (65 FE) MG tablet Take 325 mg by mouth daily with breakfast.    [provider]  fluticasone (FLONASE) 50 MCG/ACT nasal spray Place 1 spray into both nostrils at bedtime as needed for allergies.     [provider]   lansoprazole (PREVACID) 30 MG capsule Take 1 capsule (30 mg total) by mouth 2 (two) times daily before a meal. 03/27/22   Lemmon, Lavone Nian, PA  levothyroxine (SYNTHROID) 125 MCG tablet Take 125 mcg by mouth daily. 11/21/22   [provider]  lipase/protease/amylase (CREON) 36000 UNITS CPEP capsule Take 2 capsules (72,000 Units total) by mouth 3 (three) times daily with meals. May also take 1 capsule (36,000 Units total) as needed (with snacks). 12/08/22   Levin Erp, PA  LORazepam (ATIVAN) 1 MG tablet Take 1.5 mg by mouth at bedtime. Take additional 0.5 mg if needed    [provider]  Lutein 6 MG CAPS Take 6 mg by mouth daily.     [provider]  metFORMIN (GLUCOPHAGE-XR) 500 MG 24 hr tablet Take 500 mg by mouth 2 (two) times daily.    [provider]  metoprolol tartrate (LOPRESSOR) 100 MG tablet TAKE 1 TABLET(100 MG) BY MOUTH TWICE DAILY 11/30/22   Sherran Needs, NP  Polyvinyl Alcohol-Povidone PF 1.4-0.6 % SOLN Place 1 drop into both eyes daily as needed (dry eyes).     [provider]  rosuvastatin (CRESTOR) 10 MG tablet Take 10 mg by mouth at bedtime.    [provider]  sertraline (ZOLOFT) 50 MG tablet Take 50 mg  by mouth daily.    [provider]  vitamin B-12 (CYANOCOBALAMIN) 500 MCG tablet Take 500 mcg by mouth daily.    [provider]  zinc gluconate 50 MG tablet Take 50 mg by mouth daily.    [provider]      Allergies    Penicillins    Review of Systems   Review of Systems  Constitutional:  Negative for fever.  Respiratory:  Negative for shortness of breath.   Cardiovascular:  Negative for chest pain.  Musculoskeletal:        Left leg, right shoulder pain  All other systems reviewed and are negative.   Physical Exam Updated Vital Signs BP (!) 145/84   Pulse 79   Temp 97.7 F (36.5 C) (Oral)   Resp (!) 27   LMP  (LMP Unknown)   SpO2 99%  Physical Exam Vitals and  nursing note reviewed.  Constitutional:      Appearance: She is well-developed.     Comments: Elderly, alert, ABCs intact  HENT:     Head: Normocephalic and atraumatic.     Nose: Nose normal.  Eyes:     Pupils: Pupils are equal, round, and reactive to light.  Cardiovascular:     Rate and Rhythm: Normal rate and regular rhythm.  Pulmonary:     Effort: Pulmonary effort is normal. No respiratory distress.  Abdominal:     Palpations: Abdomen is soft.  Musculoskeletal:     Cervical back: Neck supple.     Comments: Pain with palpation distal femur, difficulty with straight leg raise on the right secondary to pain, patient can flex and extend at the knee, no obvious deformities  Skin:    General: Skin is warm and dry.  Neurological:     Mental Status: She is alert and oriented to person, place, and time.  Psychiatric:        Mood and Affect: Mood normal.     ED Results / Procedures / Treatments   Labs (all labs ordered are listed, but only abnormal results are displayed) Labs Reviewed  BASIC METABOLIC PANEL - Abnormal; Notable for the following components:      Result Value   Potassium 2.5 (*)    CO2 21 (*)    Glucose, Bld 143 (*)    All other components within normal limits  CBC - Abnormal; Notable for the following components:   RBC 3.45 (*)    Hemoglobin 11.9 (*)    HCT 33.7 (*)    MCH 34.5 (*)    All other components within normal limits  URINALYSIS, ROUTINE W REFLEX MICROSCOPIC - Abnormal; Notable for the following components:   Ketones, ur 5 (*)    Protein, ur 30 (*)    All other components within normal limits  CBG MONITORING, ED - Abnormal; Notable for the following components:   Glucose-Capillary 147 (*)    All other components within normal limits  CBG MONITORING, ED    EKG EKG Interpretation  Date/Time:  Tuesday December 12 2022 00:09:28 EST Ventricular Rate:  83 PR Interval:    QRS Duration: 116 QT Interval:  340 QTC Calculation: 400 R  Axis:   68 Text Interpretation: Atrial fibrillation Incomplete left bundle branch block Probable inferior infarct, age indeterminate Confirmed by Thayer Jew ([id number]) on 12/12/2022 3:57:56 AM  Radiology CT Head Wo Contrast  Result Date: 12/12/2022 CLINICAL DATA:  Trauma EXAM: CT HEAD WITHOUT CONTRAST TECHNIQUE: Contiguous axial images were obtained from the base of  the skull through the vertex without intravenous contrast. RADIATION DOSE REDUCTION: This exam was performed according to the departmental dose-optimization program which includes automated exposure control, adjustment of the mA and/or kV according to patient size and/or use of iterative reconstruction technique. COMPARISON:  12/05/2021 FINDINGS: Brain: There is no mass, hemorrhage or extra-axial collection. The size and configuration of the ventricles and extra-axial CSF spaces are normal. There is hypoattenuation of the white matter, most commonly indicating chronic small vessel disease. Vascular: No abnormal hyperdensity of the major intracranial arteries or dural venous sinuses. No intracranial atherosclerosis. Skull: The visualized skull base, calvarium and extracranial soft tissues are normal. Sinuses/Orbits: No fluid levels or advanced mucosal thickening of the visualized paranasal sinuses. No mastoid or middle ear effusion. The orbits are normal. IMPRESSION: No acute intracranial abnormality. Electronically Signed   By: Ulyses Jarred M.D.   On: 12/12/2022 01:00   DG Femur Min 2 Views Left  Result Date: 12/12/2022 CLINICAL DATA:  Fall EXAM: LEFT FEMUR 2 VIEWS COMPARISON:  None Available. FINDINGS: No fracture or dislocation is seen. The joint spaces are preserved. Soft tissues are within normal limits, noting vascular calcifications. IMPRESSION: Negative. Electronically Signed   By: Julian Hy M.D.   On: 12/12/2022 00:56   DG Pelvis 1-2 Views  Result Date: 12/12/2022 CLINICAL DATA:  Fall EXAM: PELVIS - 1-2 VIEW COMPARISON:   None Available. FINDINGS: No fracture or dislocation is seen. Bilateral hip joint spaces are symmetric. Visualized bony pelvis appears intact. Lower lumbar spine fixation hardware, incompletely visualized. IMPRESSION: Negative. Electronically Signed   By: Julian Hy M.D.   On: 12/12/2022 00:56   DG Shoulder Right  Result Date: 12/12/2022 CLINICAL DATA:  Fall EXAM: RIGHT SHOULDER - 2+ VIEW COMPARISON:  None Available. FINDINGS: No fracture or dislocation is seen. Surgical clips in the right axilla. Visualized right lung is clear. IMPRESSION: Negative. Electronically Signed   By: Julian Hy M.D.   On: 12/12/2022 00:56   DG Knee Complete 4 Views Left  Result Date: 12/12/2022 CLINICAL DATA:  Fall EXAM: LEFT KNEE - COMPLETE 4+ VIEW COMPARISON:  None Available. FINDINGS: Severe tricompartmental degenerative changes, most prominent in the patellofemoral compartment. Medial and lateral compartment chondrocalcinosis. No fracture or dislocation is seen. The visualized soft tissues are unremarkable. No suprapatellar knee joint effusion. IMPRESSION: No fracture or dislocation is seen. Severe tricompartmental degenerative changes, as above. Electronically Signed   By: Julian Hy M.D.   On: 12/12/2022 00:55    Procedures Procedures    Medications Ordered in ED Medications  potassium chloride 10 mEq in 100 mL IVPB (0 mEq Intravenous Stopped 12/12/22 0440)  potassium chloride SA (KLOR-CON M) CR tablet 40 mEq (40 mEq Oral Given 12/12/22 0011)    ED Course/ Medical Decision Making/ A&P Clinical Course as of 12/12/22 0458  Tue Dec 12, 2022  0456 Patient unable to ambulate at her baseline.  Favoring her left leg. [CH]    Clinical Course User Index [CH] Delaine Hernandez, Barbette Hair, MD                             Medical Decision Making Amount and/or Complexity of Data Reviewed Labs: ordered. Radiology: ordered.  Risk Prescription drug management. Decision regarding  hospitalization.   This patient presents to the ED for concern of fall, this involves an extensive number of treatment options, and is a complaint that carries with it a high risk of complications and morbidity.  I considered the following differential and admission for this acute, potentially life threatening condition.  The differential diagnosis includes mechanical fall, deconditioning, electrolyte abnormality, infection, traumatic injury from fall  MDM:    This is an 89 year old female who presents following reported fall.  She is able to provide history.  Complaining of left leg pain and right shoulder pain.  She is overall nontoxic and vital signs are largely reassuring.  She has no external signs of trauma.  She does report left leg pain.  No obvious deformities.  Pain is mostly over the medial thigh.  X-rays obtained and reviewed.  No obvious fractures of the pelvis, left femur or shoulder.  Strength appears grossly intact although limited exam secondary to pain of the left leg.  Have low suspicion for stroke.  She is on good stroke prophylaxis for her A-fib.  CT head does not show any evidence of acute traumatic injury.  Lab testing notable for potassium of 2.5.  This was replaced.  Unfortunately, patient was unable to ambulate her bed line.  CNA at the bedside states she has been more unsteady recently and had more frequent falls.  Could be related to metabolic derangements.  Will admit for potassium supplementation and PT evaluation.  (Labs, imaging, consults)  Labs: I Ordered, and personally interpreted labs.  The pertinent results include: CBC, BMP, urinalysis  Imaging Studies ordered: I ordered imaging studies including CT head, x-rays of right shoulder, pelvis, left femur I independently visualized and interpreted imaging. I agree with the radiologist interpretation  Additional history obtained from CNA at bedside.  External records from outside source obtained and reviewed  including prior evaluations  Cardiac Monitoring: The patient was maintained on a cardiac monitor.  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   Allergy    SEASONAL   Anemia    years ago   Anticoagulated by anticoagulation treatment - Eliquis 03/30/2014   Started May 2015    Anxiety state 10/13/2010   Qualifier: Diagnosis of  By: Nils Pyle CMA (AAMA), Leisha     Arthritis    "all my joints" (03/31/2014)   Barrett's esophagus    Breast cancer ([facility])    s/p right breast lumpectomy   CARDIAC MURMUR 10/13/2010   Qualifier: Diagnosis of  By: Nils Pyle CMA (AAMA), Leisha     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   Cervical cancer ([place])    Chronic cough 01/02/2017   GERD, hiatal hernia Doubt amio   COLONIC POLYPS, ADENOMATOUS, HX OF 10/13/2010   Qualifier: Diagnosis of  By: Nils Pyle CMA (AAMA), Leisha     Degenerative disorder of eye 07/25/2012   Diabetes mellitus without complication ([place])    Dilated cardiomyopathy ([place]) 08/27/2018   DIVERTICULOSIS, COLON 10/13/2010   Qualifier: Diagnosis of  By: Nils Pyle CMA (AAMA), Leisha     DJD (degenerative joint disease) of knee    Dysrhythmia    Afib   Echocardiogram 07/2020    Echo 9/21: EF 70-75, moderate LVH, GR 1 DD,  RVSP 40.5 (mildly elevated), normal RVSF, moderate LAE, mild RAE, mild MR, trivial AI   EROSIVE ESOPHAGITIS 10/13/2010   Qualifier: Diagnosis of  By: Nils Pyle CMA (AAMA), Leisha     Error, refractive, myopia  07/09/2012   Essential hypertension 10/13/2010   Qualifier: Diagnosis of  By: Nils Pyle CMA (AAMA), Rueben Bash, intermittent 07/09/2012   GERD 10/14/2010   Qualifier: Diagnosis of  By: Sharlett Iles MD Cline Cools R    GERD (gastroesophageal reflux disease)    Glaucoma    H/O hiatal hernia    Heart murmur    Hiatal hernia s/p robotic repair & fundoplication 9/73/5329 92/02/2682   Qualifier: Diagnosis of  By: Nils Pyle CMA (AAMA), Mearl Latin     History of laser assisted in situ keratomileusis 07/25/2012   Hx of transesophageal echocardiography (TEE) for monitoring    TEE (03/2014): No LAA clot, moderate LAE, core triatriatum type structure in RA with no stenosis, normal EF 55-60%, mild MR   HYPERCHOLESTEROLEMIA 10/13/2010   Qualifier: Diagnosis of  By: Nils Pyle CMA (AAMA), Leisha     Hypertension    Hypothyroidism, postsurgical    Iron Deficiency Anemia 10/14/2010   Qualifier: Diagnosis of  By: Sharlett Iles MD Cline Cools R    Irritable bowel syndrome 10/13/2010   Qualifier: Diagnosis of  By: Nils Pyle CMA (AAMA), Leisha     Lumbar stenosis with neurogenic claudication 04/20/2016   Malignant neoplasm of breast ([place]) 02/25/2016   Migraine    "last one was years ago" (03/31/2014)   MIGRAINE HEADACHE 10/13/2010   Qualifier: Diagnosis of  By: Nils Pyle CMA (AAMA), Leisha     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   Overview:  X(T)    OSA on CPAP    settings at 10-11    OSTEOARTHRITIS 10/13/2010   Qualifier: Diagnosis of  By: Nils Pyle CMA (AAMA), Leisha     Persistent atrial fibrillation 03/31/2015   DLCO dropped from 100% in 2015-50% in 01/2017   Post-surgical hypothyroidism 04/09/2014   RECTAL FISSURE 10/13/2010   Qualifier: Diagnosis of  By: Nils Pyle CMA (AAMA), Mearl Latin     S/P Nissen fundoplication (without gastrostomy tube) procedure 05/25/2017   Sleep apnea    THYROID CANCER 10/13/2010   Qualifier: Diagnosis of  By: Nils Pyle CMA Deborra Medina), Mearl Latin     Thyroid cancer [facility])    s/p thyroidectomy   Type II diabetes mellitus  ([place])    type II      Medicines Meds ordered this encounter  Medications   potassium chloride 10 mEq in 100 mL IVPB   potassium chloride SA (KLOR-CON M) CR tablet 40 mEq    I have reviewed the patients home medicines and have made adjustments as needed  Problem List / ED Course: Problem List Items Addressed This Visit   None Visit Diagnoses     Hypokalemia    -  Primary   Generalized weakness       Frequent falls                       Final Clinical Impression(s) / ED Diagnoses Final diagnoses:  Hypokalemia  Generalized weakness  Frequent falls    Rx / DC Orders ED Discharge Orders     None         Merryl Hacker, MD 12/12/22 0502

## 2022-12-12 NOTE — Progress Notes (Signed)
Progress Note    [name]   HWE:[id number]  DOB: 1930-11-30  DOA: 12/11/2022     0 PCP: Lavone Orn, MD  Initial CC: Fall, weakness  Hospital Course: [name] is an 89 year old female with PMH permanent A-fib on Eliquis, DM II, HTN, HLD, hypothyroidism, IDA, chronic anxiety/depression who presented after a fall at home. She has been having ongoing intermittent falls associated with worsening weakness.  She does have a caregiver, CNA at home as well.  She has also been having ongoing diarrhea and is on Creon.  She does endorse some mild improvement in her diarrhea after starting Creon recently. She was admitted for electrolyte replacement and PT/OT evaluation.  Interval History:  Seen this morning in the ER, still appearing fatigued and lethargic.  No obvious distress.  Assessment and Plan:  Fall, mechanical Imaging revealed no fractures. Supportive care Analgesics as needed PT OT assessment Fall precautions   Hypokalemia likely due to GI losses Endorses frequent loose stools, possibly contributed by home metformin. -Repeating as necessary   Type 2 diabetes with hyperglycemia Hold home metformin -A1c 5.9%.  Likely could try diet control and see how diarrhea also responds to being off metformin -Continue SSI and CBG monitoring   Loose stools, up to 5/day Afebrile with no leukocytosis Possibly side effect from metformin vs pancreatic insufficiency or combo - continue creon - continue holding metformin at discharge    Mild non anion gap metabolic acidosis -Suspect due to GI losses -Continue hydration and encouraging oral intake   Persistent A-fib on Eliquis, rate controlled Resume home Eliquis and lopressor Monitor on telemetry.   Iron deficiency anemia Resume home ferrous sulfate Hemoglobin is stable No reported overt bleeding   Hypothyroidism Resume home levothyroxine   Hyperlipidemia Resume home Crestor   Chronic anxiety/depression Resume  home Zoloft   GERD Resume home lansoprazole  Old records reviewed in assessment of this patient  Antimicrobials:   DVT prophylaxis:   apixaban (ELIQUIS) tablet 5 mg   Code Status:   Code Status: Full Code  Mobility Assessment (last 72 hours)     Mobility Assessment     Row Name 12/12/22 1251 12/12/22 0800         What is the highest level of mobility based on the progressive mobility assessment? Level 5 (Walks with assist in room/hall) - Balance while stepping forward/back and can walk in room with assist - Complete Level 3 (Stands with assist) - Balance while standing  and cannot march in place               Barriers to discharge:  Disposition Plan:  pending PT eval Status is: Inpt  Objective: Blood pressure (!) 145/101, pulse 92, temperature 97.9 F (36.6 C), temperature source Oral, resp. rate 20, SpO2 100 %.  Examination:  Physical Exam Constitutional:      General: She is not in acute distress.    Appearance: Normal appearance.  HENT:     Head: Normocephalic and atraumatic.     Mouth/Throat:     Mouth: Mucous membranes are moist.  Eyes:     Extraocular Movements: Extraocular movements intact.  Pulmonary:     Effort: Pulmonary effort is normal.     Breath sounds: Normal breath sounds.  Abdominal:     General: Bowel sounds are normal. There is no distension.     Palpations: Abdomen is soft.     Tenderness: There is no abdominal tenderness.  Musculoskeletal:  General: Normal range of motion.     Cervical back: Normal range of motion and neck supple.  Skin:    General: Skin is warm and dry.  Neurological:     General: No focal deficit present.     Mental Status: She is alert.  Psychiatric:        Mood and Affect: Mood normal.      Consultants:    Procedures:    Data Reviewed: Results for orders placed or performed during the hospital encounter of 12/11/22 (from the past 24 hour(s))  Basic metabolic panel     Status: Abnormal    Collection Time: 12/11/22 10:20 PM  Result Value Ref Range   Sodium 138 135 - 145 mmol/L   Potassium 2.5 (LL) 3.5 - 5.1 mmol/L   Chloride 104 98 - 111 mmol/L   CO2 21 (L) 22 - 32 mmol/L   Glucose, Bld 143 (H) 70 - 99 mg/dL   BUN 22 8 - 23 mg/dL   Creatinine, Ser 0.85 0.44 - 1.00 mg/dL   Calcium 9.0 8.9 - 10.3 mg/dL   GFR, Estimated >60 >60 mL/min   Anion gap 13 5 - 15  CBC     Status: Abnormal   Collection Time: 12/11/22 10:20 PM  Result Value Ref Range   WBC 6.6 4.0 - 10.5 K/uL   RBC 3.45 (L) 3.87 - 5.11 MIL/uL   Hemoglobin 11.9 (L) 12.0 - 15.0 g/dL   HCT 33.7 (L) 36.0 - 46.0 %   MCV 97.7 80.0 - 100.0 fL   MCH 34.5 (H) 26.0 - 34.0 pg   MCHC 35.3 30.0 - 36.0 g/dL   RDW 13.3 11.5 - 15.5 %   Platelets 197 150 - 400 K/uL   nRBC 0.0 0.0 - 0.2 %  CBG monitoring, ED     Status: Abnormal   Collection Time: 12/11/22 10:22 PM  Result Value Ref Range   Glucose-Capillary 147 (H) 70 - 99 mg/dL  Urinalysis, Routine w reflex microscopic -Urine, Clean Catch     Status: Abnormal   Collection Time: 12/12/22  3:22 AM  Result Value Ref Range   Color, Urine YELLOW YELLOW   APPearance CLEAR CLEAR   Specific Gravity, Urine 1.018 1.005 - 1.030   pH 5.0 5.0 - 8.0   Glucose, UA NEGATIVE NEGATIVE mg/dL   Hgb urine dipstick NEGATIVE NEGATIVE   Bilirubin Urine NEGATIVE NEGATIVE   Ketones, ur 5 (A) NEGATIVE mg/dL   Protein, ur 30 (A) NEGATIVE mg/dL   Nitrite NEGATIVE NEGATIVE   Leukocytes,Ua NEGATIVE NEGATIVE   RBC / HPF 0-5 0 - 5 RBC/hpf   WBC, UA 0-5 0 - 5 WBC/hpf   Bacteria, UA NONE SEEN NONE SEEN   Squamous Epithelial / HPF 0-5 0 - 5 /HPF   Mucus PRESENT    Hyaline Casts, UA PRESENT   Hemoglobin A1c     Status: Abnormal   Collection Time: 12/12/22  6:32 AM  Result Value Ref Range   Hgb A1c MFr Bld 5.9 (H) 4.8 - 5.6 %   Mean Plasma Glucose 122.63 mg/dL  Magnesium     Status: Abnormal   Collection Time: 12/12/22  6:36 AM  Result Value Ref Range   Magnesium 1.5 (L) 1.7 - 2.4 mg/dL  CBC      Status: Abnormal   Collection Time: 12/12/22  6:36 AM  Result Value Ref Range   WBC 6.0 4.0 - 10.5 K/uL   RBC 3.41 (L) 3.87 - 5.11 MIL/uL  Hemoglobin 11.8 (L) 12.0 - 15.0 g/dL   HCT 33.5 (L) 36.0 - 46.0 %   MCV 98.2 80.0 - 100.0 fL   MCH 34.6 (H) 26.0 - 34.0 pg   MCHC 35.2 30.0 - 36.0 g/dL   RDW 13.5 11.5 - 15.5 %   Platelets 189 150 - 400 K/uL   nRBC 0.0 0.0 - 0.2 %  Basic metabolic panel     Status: Abnormal   Collection Time: 12/12/22  6:36 AM  Result Value Ref Range   Sodium 141 135 - 145 mmol/L   Potassium 2.5 (LL) 3.5 - 5.1 mmol/L   Chloride 106 98 - 111 mmol/L   CO2 24 22 - 32 mmol/L   Glucose, Bld 112 (H) 70 - 99 mg/dL   BUN 17 8 - 23 mg/dL   Creatinine, Ser 0.62 0.44 - 1.00 mg/dL   Calcium 8.6 (L) 8.9 - 10.3 mg/dL   GFR, Estimated >60 >60 mL/min   Anion gap 11 5 - 15  Phosphorus     Status: Abnormal   Collection Time: 12/12/22  6:36 AM  Result Value Ref Range   Phosphorus 2.4 (L) 2.5 - 4.6 mg/dL  CBG monitoring, ED     Status: Abnormal   Collection Time: 12/12/22  7:36 AM  Result Value Ref Range   Glucose-Capillary 105 (H) 70 - 99 mg/dL  CBG monitoring, ED     Status: Abnormal   Collection Time: 12/12/22  7:58 AM  Result Value Ref Range   Glucose-Capillary 122 (H) 70 - 99 mg/dL   Comment 1 Notify RN    Comment 2 Document in Chart   CBG monitoring, ED     Status: Abnormal   Collection Time: 12/12/22 12:20 PM  Result Value Ref Range   Glucose-Capillary 161 (H) 70 - 99 mg/dL    I have reviewed pertinent nursing notes, vitals, labs, and images as necessary. I have ordered labwork to follow up on as indicated.  I have reviewed the last notes from staff over past 24 hours. I have discussed patient's care plan and test results with nursing staff, CM/SW, and other staff as appropriate.  Time spent: Greater than 50% of the 55 minute visit was spent in counseling/coordination of care for the patient as laid out in the A&P.   LOS: 0 days   Dwyane Dee,  MD [facility] 12/12/2022, 2:49 PM

## 2022-12-12 NOTE — Progress Notes (Signed)
TRH night cross cover note:   I was notified by RN that the patient is appearing confused this evening, which is new relative to the report that she had received.  No report of any documented history of underlying dementia or documentation of prior episodes of sundowning.  She is here with following multiple electrolyte abnormalities.  Per my review chart review, as the patient presented with a fall and is on blood thinners as an outpatient, CT head was pursued and showed no evidence of acute process.  Additionally, presenting urinalysis was not consistent with UTI.  As this appears to be new confusion for the patient, will expand workup to include chest x-ray, ammonia level, VBG, CPK, and TSH. I also ordered fall precautions as well as delirium precautions.    Babs Bertin, DO Hospitalist

## 2022-12-12 NOTE — ED Notes (Signed)
ED TO INPATIENT HANDOFF REPORT  ED Nurse Name and Phone #: Mel Almond [phone]  S Name/Age/Gender [name] 89 y.o. female Room/Bed: 014C/014C  Code Status   Code Status: Full Code  Home/SNF/Other Home with caregiver  Patient oriented to: self, place, time, and situation Is this baseline? Yes   Triage Complete: Triage complete  Chief Complaint Fall [W19.XXXA]  Triage Note Pt from home by EMS for fall. Reports getting up and the house was dark and became disoriented and fell. Pt is on Eliquis, denies hitting her head. Had a previous fall last week and was seen in the afib clinic. C/o L leg pain  and R shoulder pain from previous fall.    Allergies Allergies  Allergen Reactions   Penicillins Itching, Rash and Other (See Comments)    Did it involve swelling of the face/tongue/throat, SOB, or low BP? No Did it involve sudden or severe rash/hives, skin peeling, or any reaction on the inside of your mouth or nose? No Did you need to seek medical attention at a hospital or doctor's office? No When did it last happen?    30+ years   If all above answers are "NO", may proceed with cephalosporin use.     Level of Care/Admitting Diagnosis ED Disposition     ED Disposition  Admit   Condition  --   Comment  Hospital Area: [place] [[id number]]  Level of Care: Med-Surg [16]  May admit patient to [facility] or [facility] if equivalent level of care is available:: No  Covid Evaluation: Asymptomatic - no recent exposure (last 10 days) testing not required  Diagnosis: Fall [290176]  Admitting Physician: Dwyane Dee [phone]  Attending Physician: Dwyane Dee [[id number]]  Certification:: I certify this patient will need inpatient services for at least 2 midnights  Estimated Length of Stay: 2          B Medical/Surgery History Past Medical History:  Diagnosis Date   Acute bronchitis 11/15/2017   ADENOCARCINOMA, BREAST 10/13/2010   Qualifier: Diagnosis of   By: Nils Pyle CMA (AAMA), Mearl Latin     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   Allergy    SEASONAL   Anemia    years ago   Anticoagulated by anticoagulation treatment - Eliquis 03/30/2014   Started May 2015    Anxiety state 10/13/2010   Qualifier: Diagnosis of  By: Nils Pyle CMA (AAMA), Leisha     Arthritis    "all my joints" (03/31/2014)   Barrett's esophagus    Breast cancer ([place])    s/p right breast lumpectomy   CARDIAC MURMUR 10/13/2010   Qualifier: Diagnosis of  By: Nils Pyle CMA (AAMA), Leisha     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   Cervical cancer ([place])    Chronic cough 01/02/2017   GERD, hiatal hernia Doubt amio   COLONIC POLYPS, ADENOMATOUS, HX OF 10/13/2010   Qualifier: Diagnosis of  By: Nils Pyle CMA ([place]), Leisha     Degenerative disorder of eye 07/25/2012   Diabetes mellitus without complication ([place])    Dilated cardiomyopathy ([place]) 08/27/2018   DIVERTICULOSIS, COLON 10/13/2010   Qualifier: Diagnosis of  By: Nils Pyle CMA (AAMA), Leisha     DJD (degenerative joint disease) of knee    Dysrhythmia    Afib   Echocardiogram 07/2020    Echo 9/21: EF 70-75, moderate LVH, GR 1 DD, RVSP 40.5 (mildly elevated), normal RVSF, moderate LAE, mild RAE, mild MR, trivial AI   EROSIVE ESOPHAGITIS 10/13/2010   Qualifier: Diagnosis of  By: Nils Pyle CMA (AAMA), Leisha     Error, refractive, myopia 07/09/2012   Essential hypertension 10/13/2010   Qualifier: Diagnosis of  By: Nils Pyle CMA (AAMA), Rueben Bash, intermittent 07/09/2012   GERD 10/14/2010   Qualifier: Diagnosis of  By: Sharlett Iles MD Cline Cools R    GERD (gastroesophageal reflux disease)    Glaucoma    H/O hiatal hernia    Heart murmur    Hiatal hernia s/p robotic repair & fundoplication 1/82/9937 16/07/6788   Qualifier: Diagnosis of  By: Nils Pyle CMA (AAMA), Mearl Latin     History of laser assisted in situ keratomileusis 07/25/2012   Hx of transesophageal echocardiography (TEE) for monitoring    TEE (03/2014): No LAA clot, moderate  LAE, core triatriatum type structure in RA with no stenosis, normal EF 55-60%, mild MR   HYPERCHOLESTEROLEMIA 10/13/2010   Qualifier: Diagnosis of  By: Nils Pyle CMA (AAMA), Leisha     Hypertension    Hypothyroidism, postsurgical    Iron Deficiency Anemia 10/14/2010   Qualifier: Diagnosis of  By: Sharlett Iles MD Cline Cools R    Irritable bowel syndrome 10/13/2010   Qualifier: Diagnosis of  By: Nils Pyle CMA (AAMA), Leisha     Lumbar stenosis with neurogenic claudication 04/20/2016   Malignant neoplasm of breast ([place]) 02/25/2016   Migraine    "last one was years ago" (03/31/2014)   MIGRAINE HEADACHE 10/13/2010   Qualifier: Diagnosis of  By: Nils Pyle CMA (AAMA), Leisha     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   Overview:  X(T)    OSA on CPAP    settings at 10-11    OSTEOARTHRITIS 10/13/2010   Qualifier: Diagnosis of  By: Nils Pyle CMA (AAMA), Leisha     Persistent atrial fibrillation 03/31/2015   DLCO dropped from 100% in 2015-50% in 01/2017   Post-surgical hypothyroidism 04/09/2014   RECTAL FISSURE 10/13/2010   Qualifier: Diagnosis of  By: Nils Pyle CMA (AAMA), Mearl Latin     S/P Nissen fundoplication (without gastrostomy tube) procedure 05/25/2017   Sleep apnea    THYROID CANCER 10/13/2010   Qualifier: Diagnosis of  By: Nils Pyle CMA Deborra Medina), Mearl Latin     Thyroid cancer [facility])    s/p thyroidectomy   Type II diabetes mellitus ([place])    type II    Past Surgical History:  Procedure Laterality Date   ABDOMINAL HYSTERECTOMY     "partial"   BREAST BIOPSY Right    BREAST LUMPECTOMY Right    CARDIOVERSION N/A 03/30/2014   Procedure: CARDIOVERSION - BEDSIDE;  Surgeon: [name], MD;  Location: [place];  Service: Cardiovascular;  Laterality: N/A;   CARDIOVERSION N/A 03/31/2014   Procedure: CARDIOVERSION  (BEDSIDE) ;  Surgeon: Lelon Perla, MD;  Location: [place];  Service: Cardiovascular;  Laterality: N/A;   CARDIOVERSION N/A 04/27/2014   Procedure: CARDIOVERSION;  Surgeon: Darlin Coco, MD;  Location: [place];   Service: Cardiovascular;  Laterality: N/A;   CARDIOVERSION N/A 04/13/2015   Procedure: CARDIOVERSION;  Surgeon: Thayer Headings, MD;  Location: [place];  Service: Cardiovascular;  Laterality: N/A;   CARDIOVERSION N/A 01/08/2017   Procedure: CARDIOVERSION;  Surgeon: Sanda Klein, MD;  Location: [facility] ENDOSCOPY;  Service: Cardiovascular;  Laterality: N/A;   CARDIOVERSION N/A 09/19/2017   Procedure: CARDIOVERSION;  Surgeon: [name], MD;  Location[facility] ENDOSCOPY;  Service: Cardiovascular;  Laterality: N/A;   CARDIOVERSION N/A 10/25/2017   Procedure: CARDIOVERSION;  Surgeon: Acie Fredrickson Wonda Cheng, MD;  Location: [place];  Service: Cardiovascular;  Laterality: N/A;   CARDIOVERSION  N/A 12/27/2018   Procedure: CARDIOVERSION;  Surgeon: [name], MD;  Location: [place];  Service: Cardiovascular;  Laterality: N/A;   CARDIOVERSION N/A 05/20/2019   Procedure: CARDIOVERSION;  Surgeon: [name], MD;  Location: [place];  Service: Cardiovascular;  Laterality: N/A;   CARDIOVERSION N/A 12/10/2019   Procedure: CARDIOVERSION;  Surgeon: Pixie Casino, MD;  Location: [place];  Service: Cardiovascular;  Laterality: N/A;   CARDIOVERSION N/A 12/31/2019   Procedure: CARDIOVERSION;  Surgeon: Sanda Klein, MD;  Location: [place];  Service: Cardiovascular;  Laterality: N/A;   CARDIOVERSION N/A 10/21/2020   Procedure: CARDIOVERSION;  Surgeon: [name], MD;  Location: [place];  Service: Cardiovascular;  Laterality: N/A;   CARDIOVERSION N/A 12/03/2020   Procedure: CARDIOVERSION;  Surgeon: Jerline Pain, MD;  Location: [place];  Service: Cardiovascular;  Laterality: N/A;   CARDIOVERSION N/A 07/11/2021   Procedure: CARDIOVERSION;  Surgeon: Pixie Casino, MD;  Location: [place];  Service: Cardiovascular;  Laterality: N/A;   CARPAL TUNNEL RELEASE Right    CATARACT EXTRACTION W/ INTRAOCULAR LENS  IMPLANT, BILATERAL Bilateral    COLONOSCOPY     EXCISIONAL  HEMORRHOIDECTOMY     INSERTION OF MESH N/A 05/25/2017   Procedure: INSERTION OF MESH;  Surgeon: Ralene Ok, MD;  Location: [facility];  Service: General;  Laterality: N/A;   TEE WITHOUT CARDIOVERSION N/A 03/30/2014   Procedure: TRANSESOPHAGEAL ECHOCARDIOGRAM (TEE);  Surgeon: [name], MD;  Location: [facility] ENDOSCOPY;  Service: Cardiovascular;  Laterality: N/A;   THYROIDECTOMY     TONSILLECTOMY       A IV Location/Drains/Wounds Patient Lines/Drains/Airways Status     Active Line/Drains/Airways     Name Placement date Placement time Site Days   Peripheral IV 12/11/22 22 G Anterior;Left Forearm 12/11/22  2227  Forearm  1            Intake/Output Last 24 hours  Intake/Output Summary (Last 24 hours) at 12/12/2022 2029 Last data filed at 12/12/2022 1600 Gross per 24 hour  Intake 599.21 ml  Output --  Net 599.21 ml    Labs/Imaging Results for orders placed or performed during the hospital encounter of 12/11/22 (from the past 48 hour(s))  Basic metabolic panel     Status: Abnormal   Collection Time: 12/11/22 10:20 PM  Result Value Ref Range   Sodium 138 135 - 145 mmol/L   Potassium 2.5 (LL) 3.5 - 5.1 mmol/L    Comment: CRITICAL RESULT CALLED TO, READ BACK BY AND VERIFIED WITH B.OLDLAND,RN. 2307 12/11/22. LPAIT   Chloride 104 98 - 111 mmol/L   CO2 21 (L) 22 - 32 mmol/L   Glucose, Bld 143 (H) 70 - 99 mg/dL    Comment: Glucose reference range applies only to samples taken after fasting for at least 8 hours.   BUN 22 8 - 23 mg/dL   Creatinine, Ser 0.85 0.44 - 1.00 mg/dL   Calcium 9.0 8.9 - 10.3 mg/dL   GFR, Estimated >60 >60 mL/min    Comment: (NOTE) Calculated using the CKD-EPI Creatinine Equation (2021)    Anion gap 13 5 - 15    Comment: Performed at [facility] [address] [place] [address]  CBC     Status: Abnormal   Collection Time: 12/11/22 10:20 PM  Result Value Ref Range   WBC 6.6 4.0 - 10.5 K/uL   RBC 3.45 (L) 3.87 - 5.11 MIL/uL    Hemoglobin 11.9 (L) 12.0 - 15.0 g/dL  HCT 33.7 (L) 36.0 - 46.0 %   MCV 97.7 80.0 - 100.0 fL   MCH 34.5 (H) 26.0 - 34.0 pg   MCHC 35.3 30.0 - 36.0 g/dL   RDW 13.3 11.5 - 15.5 %   Platelets 197 150 - 400 K/uL   nRBC 0.0 0.0 - 0.2 %    Comment: Performed at [facility] [address] [place], [place] [address]  CBG monitoring, ED     Status: Abnormal   Collection Time: 12/11/22 10:22 PM  Result Value Ref Range   Glucose-Capillary 147 (H) 70 - 99 mg/dL    Comment: Glucose reference range applies only to samples taken after fasting for at least 8 hours.  Urinalysis, Routine w reflex microscopic -Urine, Clean Catch     Status: Abnormal   Collection Time: 12/12/22  3:22 AM  Result Value Ref Range   Color, Urine YELLOW YELLOW   APPearance CLEAR CLEAR   Specific Gravity, Urine 1.018 1.005 - 1.030   pH 5.0 5.0 - 8.0   Glucose, UA NEGATIVE NEGATIVE mg/dL   Hgb urine dipstick NEGATIVE NEGATIVE   Bilirubin Urine NEGATIVE NEGATIVE   Ketones, ur 5 (A) NEGATIVE mg/dL   Protein, ur 30 (A) NEGATIVE mg/dL   Nitrite NEGATIVE NEGATIVE   Leukocytes,Ua NEGATIVE NEGATIVE   RBC / HPF 0-5 0 - 5 RBC/hpf   WBC, UA 0-5 0 - 5 WBC/hpf   Bacteria, UA NONE SEEN NONE SEEN   Squamous Epithelial / HPF 0-5 0 - 5 /HPF   Mucus PRESENT    Hyaline Casts, UA PRESENT     Comment: Performed at [facility], [address] [place], [place] [address]  Hemoglobin A1c     Status: Abnormal   Collection Time: 12/12/22  6:32 AM  Result Value Ref Range   Hgb A1c MFr Bld 5.9 (H) 4.8 - 5.6 %    Comment: (NOTE) Pre diabetes:          5.7%-6.4%  Diabetes:              >6.4%  Glycemic control for   <7.0% adults with diabetes    Mean Plasma Glucose 122.63 mg/dL    Comment: Performed at [facility] [address], [place], [place] [address]  Magnesium     Status: Abnormal   Collection Time: 12/12/22  6:36 AM  Result Value Ref Range   Magnesium 1.5 (L) 1.7 - 2.4 mg/dL    Comment: Performed at [facility] [address], [place], [place] [address]  CBC     Status: Abnormal   Collection Time: 12/12/22  6:36 AM  Result Value Ref Range   WBC 6.0 4.0 - 10.5 K/uL   RBC 3.41 (L) 3.87 - 5.11 MIL/uL   Hemoglobin 11.8 (L) 12.0 - 15.0 g/dL   HCT 33.5 (L) 36.0 - 46.0 %   MCV 98.2 80.0 - 100.0 fL   MCH 34.6 (H) 26.0 - 34.0 pg   MCHC 35.2 30.0 - 36.0 g/dL   RDW 13.5 11.5 - 15.5 %   Platelets 189 150 - 400 K/uL   nRBC 0.0 0.0 - 0.2 %    Comment: Performed at [facility], [place] [address], [place], [place] [address]  Basic metabolic panel     Status: Abnormal   Collection Time: 12/12/22  6:36 AM  Result Value Ref Range   Sodium 141 135 - 145 mmol/L   Potassium 2.5 (LL) 3.5 -  5.1 mmol/L    Comment: CRITICAL RESULT CALLED TO, READ BACK BY AND VERIFIED WITH M.DOSS RN [phone] 12/12/22 MCCORMICK K   Chloride 106 98 - 111 mmol/L   CO2 24 22 - 32 mmol/L   Glucose, Bld 112 (H) 70 - 99 mg/dL    Comment: Glucose reference range applies only to samples taken after fasting for at least 8 hours.   BUN 17 8 - 23 mg/dL   Creatinine, Ser 0.62 0.44 - 1.00 mg/dL   Calcium 8.6 (L) 8.9 - 10.3 mg/dL   GFR, Estimated >60 >60 mL/min    Comment: (NOTE) Calculated using the CKD-EPI Creatinine Equation (2021)    Anion gap 11 5 - 15    Comment: Performed at [facility] [address], [place], [place] [address]  Phosphorus     Status: Abnormal   Collection Time: 12/12/22  6:36 AM  Result Value Ref Range   Phosphorus 2.4 (L) 2.5 - 4.6 mg/dL    Comment: Performed at [facility] [address], [place], [place] [address]  CBG monitoring, ED     Status: Abnormal   Collection Time: 12/12/22  7:36 AM  Result Value Ref Range   Glucose-Capillary 105 (H) 70 - 99 mg/dL    Comment: Glucose reference range applies only to samples taken after fasting for at least 8 hours.  CBG monitoring, ED     Status: Abnormal   Collection Time: 12/12/22  7:58 AM  Result Value Ref Range   Glucose-Capillary  122 (H) 70 - 99 mg/dL    Comment: Glucose reference range applies only to samples taken after fasting for at least 8 hours.   Comment 1 Notify RN    Comment 2 Document in Chart   CBG monitoring, ED     Status: Abnormal   Collection Time: 12/12/22 12:20 PM  Result Value Ref Range   Glucose-Capillary 161 (H) 70 - 99 mg/dL    Comment: Glucose reference range applies only to samples taken after fasting for at least 8 hours.  CBG monitoring, ED     Status: Abnormal   Collection Time: 12/12/22  5:38 PM  Result Value Ref Range   Glucose-Capillary 166 (H) 70 - 99 mg/dL    Comment: Glucose reference range applies only to samples taken after fasting for at least 8 hours.   CT Head Wo Contrast  Result Date: 12/12/2022 CLINICAL DATA:  Trauma EXAM: CT HEAD WITHOUT CONTRAST TECHNIQUE: Contiguous axial images were obtained from the base of the skull through the vertex without intravenous contrast. RADIATION DOSE REDUCTION: This exam was performed according to the departmental dose-optimization program which includes automated exposure control, adjustment of the mA and/or kV according to patient size and/or use of iterative reconstruction technique. COMPARISON:  12/05/2021 FINDINGS: Brain: There is no mass, hemorrhage or extra-axial collection. The size and configuration of the ventricles and extra-axial CSF spaces are normal. There is hypoattenuation of the white matter, most commonly indicating chronic small vessel disease. Vascular: No abnormal hyperdensity of the major intracranial arteries or dural venous sinuses. No intracranial atherosclerosis. Skull: The visualized skull base, calvarium and extracranial soft tissues are normal. Sinuses/Orbits: No fluid levels or advanced mucosal thickening of the visualized paranasal sinuses. No mastoid or middle ear effusion. The orbits are normal. IMPRESSION: No acute intracranial abnormality. Electronically Signed   By: Ulyses Jarred M.D.   On: 12/12/2022 01:00   DG  Femur Min 2 Views Left  Result Date: 12/12/2022 CLINICAL DATA:  Fall EXAM: LEFT FEMUR 2 VIEWS COMPARISON:  None Available. FINDINGS: No fracture or dislocation is seen. The joint spaces are preserved. Soft tissues are within normal limits, noting vascular calcifications. IMPRESSION: Negative. Electronically Signed   By: Julian Hy M.D.   On: 12/12/2022 00:56   DG Pelvis 1-2 Views  Result Date: 12/12/2022 CLINICAL DATA:  Fall EXAM: PELVIS - 1-2 VIEW COMPARISON:  None Available. FINDINGS: No fracture or dislocation is seen. Bilateral hip joint spaces are symmetric. Visualized bony pelvis appears intact. Lower lumbar spine fixation hardware, incompletely visualized. IMPRESSION: Negative. Electronically Signed   By: Julian Hy M.D.   On: 12/12/2022 00:56   DG Shoulder Right  Result Date: 12/12/2022 CLINICAL DATA:  Fall EXAM: RIGHT SHOULDER - 2+ VIEW COMPARISON:  None Available. FINDINGS: No fracture or dislocation is seen. Surgical clips in the right axilla. Visualized right lung is clear. IMPRESSION: Negative. Electronically Signed   By: Julian Hy M.D.   On: 12/12/2022 00:56   DG Knee Complete 4 Views Left  Result Date: 12/12/2022 CLINICAL DATA:  Fall EXAM: LEFT KNEE - COMPLETE 4+ VIEW COMPARISON:  None Available. FINDINGS: Severe tricompartmental degenerative changes, most prominent in the patellofemoral compartment. Medial and lateral compartment chondrocalcinosis. No fracture or dislocation is seen. The visualized soft tissues are unremarkable. No suprapatellar knee joint effusion. IMPRESSION: No fracture or dislocation is seen. Severe tricompartmental degenerative changes, as above. Electronically Signed   By: Julian Hy M.D.   On: 12/12/2022 00:55    Pending Labs Unresulted Labs (From admission, onward)     Start     Ordered   12/13/22 3244  Basic metabolic panel  Daily,   R     Question:  Specimen collection method  Answer:  Lab=Lab collect   12/12/22 1856    12/13/22 0500  CBC with Differential/Platelet  Daily,   R     Question:  Specimen collection method  Answer:  Lab=Lab collect   12/12/22 1856   12/13/22 0500  Magnesium  Daily,   R     Question:  Specimen collection method  Answer:  Lab=Lab collect   12/12/22 1856            Vitals/Pain Today's Vitals   12/12/22 1700 12/12/22 1831 12/12/22 1924 12/12/22 1926  BP: (!) 148/87   (!) 147/84  Pulse: 74   85  Resp: (!) 23   (!) 23  Temp:  98.2 F (36.8 C)  98.2 F (36.8 C)  TempSrc:  Oral  Oral  SpO2: 98%   100%  PainSc:   0-No pain     Isolation Precautions No active isolations  Medications Medications  apixaban (ELIQUIS) tablet 5 mg (5 mg Oral Given 12/12/22 0910)  ferrous sulfate tablet 325 mg (325 mg Oral Given 12/12/22 0737)  pantoprazole (PROTONIX) EC tablet 20 mg (20 mg Oral Given 12/12/22 0910)  levothyroxine (SYNTHROID) tablet 125 mcg (125 mcg Oral Given 12/12/22 0651)  rosuvastatin (CRESTOR) tablet 10 mg (has no administration in time range)  sertraline (ZOLOFT) tablet 50 mg (50 mg Oral Given 12/12/22 0941)  cyanocobalamin (VITAMIN B12) tablet 500 mcg (500 mcg Oral Given 12/12/22 0910)  metoprolol tartrate (LOPRESSOR) tablet 12.5 mg (12.5 mg Oral Given 12/12/22 0654)  insulin aspart (novoLOG) injection 0-9 Units (2 Units Subcutaneous Given 12/12/22 1806)  insulin aspart (novoLOG) injection 0-5 Units (has no administration in time range)  acetaminophen (TYLENOL) tablet 650 mg (650 mg Oral Given 12/12/22 1500)  lipase/protease/amylase (CREON) capsule 36,000 Units (36,000 Units  Oral Given 12/12/22 1806)  loratadine (CLARITIN) tablet 10 mg (10 mg Oral Given 12/12/22 0941)  cholecalciferol (VITAMIN D3) 25 MCG (1000 UNIT) tablet 1,000 Units (1,000 Units Oral Given 12/12/22 0941)  fluticasone (FLONASE) 50 MCG/ACT nasal spray 1 spray (has no administration in time range)  polyvinyl alcohol (LIQUIFILM TEARS) 1.4 % ophthalmic solution 1 drop (has no administration in time range)  zinc  sulfate capsule 220 mg (220 mg Oral Given 12/12/22 0910)  lactated ringers infusion ( Intravenous Rate/Dose Verify 12/12/22 1924)  LORazepam (ATIVAN) tablet 0.5 mg (0.5 mg Oral Given 12/12/22 1339)  potassium chloride 10 mEq in 100 mL IVPB (0 mEq Intravenous Stopped 12/12/22 0440)  potassium chloride SA (KLOR-CON M) CR tablet 40 mEq (40 mEq Oral Given 12/12/22 0011)  ipratropium-albuterol (DUONEB) 0.5-2.5 (3) MG/3ML nebulizer solution 3 mL (3 mLs Nebulization Given 12/12/22 0738)  potassium chloride SA (KLOR-CON M) CR tablet 40 mEq (40 mEq Oral Given 12/12/22 0910)  potassium PHOSPHATE 30 mmol in dextrose 5 % 500 mL infusion (0 mmol Intravenous Stopped 12/12/22 1600)  magnesium sulfate IVPB 2 g 50 mL (0 g Intravenous Stopped 12/12/22 0954)    Mobility walks with person assist     Focused Assessments Neuro Assessment Handoff:   Cardiac Rhythm: Atrial fibrillation       Neuro Assessment: Exceptions to [facility] Neuro Checks:   If patient is a Neuro Trauma and patient is going to OR before floor call report to [place] nurse: [phone] or [phone]   R Recommendations: See Admitting Provider Note  Report given to:   Additional Notes: =-

## 2022-12-12 NOTE — ED Notes (Signed)
Attempted to ambulate with 2 assist. Favors L leg on ambulation. Has pain in upper leg and hip.

## 2022-12-12 NOTE — H&P (Addendum)
History and Physical  [name] IPJ:[id number] DOB: Jun 18, 1931 DOA: 12/11/2022  Referring physician: Dr. Chales Abrahams, [place] PCP: Lavone Orn, MD  Outpatient Specialists: Cardiology. Patient coming from: Home.  Chief Complaint: Fall.  HPI: [name] is a 89 y.o. female with medical history significant for permanent atrial fibrillation on Eliquis, type 2 diabetes, hypertension, hyperlipidemia, hypothyroidism, iron deficiency anemia, chronic anxiety/depression, who presented to [facility] ED from home after a fall.  Landed on her buttocks.  Since the fall she is having left leg pain.  She is in the room accompanied by her personal CNA who reports that the patient has had other falls.  Endorses frequent loose stools.  No abdominal pain.  No subjective fevers.  At baseline ambulates occasionally with a walker or a cane.  EMS was activated.  The patient was brought into the ED for further evaluation.  In the ED, she is afebrile with no leukocytosis.  Imagings are negative for fracture.  Lab studies notable for hypokalemia with serum potassium of 2.5.  Electrolytes replacement was initiated in the ED.  Per the patient's personal CNA, her ambulatory status is not at baseline.  EDP requested admission for electrolytes replacement and for PT OT evaluation in the setting of acute ambulatory dysfunction.  The patient was admitted by [facility], hospitalist service as observation status.  ED Course: Tmax 97.7.  BP 145/84, pulse 66, respiratory 28, O2 saturation 100% on room air.  Lab studies remarkable for serum potassium 2.5, serum bicarb 21, glucose 143.  Hemoglobin 11.9.  UA negative for pyuria.  Review of Systems: Review of systems as noted in the HPI. All other systems reviewed and are negative.   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   Allergy    SEASONAL   Anemia    years ago   Anticoagulated by anticoagulation treatment - Eliquis 03/30/2014   Started May 2015    Anxiety state 10/13/2010   Qualifier: Diagnosis of  By: Nils Pyle CMA (AAMA), Leisha     Arthritis    "all my joints" (03/31/2014)   Barrett's esophagus    Breast cancer ([place])    s/p right breast lumpectomy   CARDIAC MURMUR 10/13/2010   Qualifier: Diagnosis of  By: Nils Pyle CMA (AAMA), Leisha     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   Cervical cancer ([place])    Chronic cough 01/02/2017   GERD, hiatal hernia Doubt amio   COLONIC POLYPS, ADENOMATOUS, HX OF 10/13/2010   Qualifier: Diagnosis of  By: Nils Pyle CMA (AAMA), Leisha     Degenerative disorder of eye 07/25/2012   Diabetes mellitus without complication ([place])    Dilated cardiomyopathy ([place]) 08/27/2018   DIVERTICULOSIS, COLON 10/13/2010   Qualifier: Diagnosis of  By: Nils Pyle CMA (AAMA), Leisha     DJD (degenerative joint disease) of knee    Dysrhythmia    Afib   Echocardiogram 07/2020    Echo 9/21: EF 70-75, moderate LVH, GR 1 DD, RVSP 40.5 (mildly elevated), normal RVSF, moderate LAE, mild RAE, mild MR, trivial AI   EROSIVE ESOPHAGITIS 10/13/2010   Qualifier: Diagnosis of  By: Nils Pyle CMA (AAMA), Leisha     Error, refractive, myopia 07/09/2012   Essential hypertension 10/13/2010   Qualifier: Diagnosis of  By: Nils Pyle CMA (AAMA), Rueben Bash, intermittent 07/09/2012   GERD 10/14/2010  Qualifier: Diagnosis of  By: Sharlett Iles MD Byrd Hesselbach    GERD (gastroesophageal reflux disease)    Glaucoma    H/O hiatal hernia    Heart murmur    Hiatal hernia s/p robotic repair & fundoplication 6/43/3295 18/06/4165   Qualifier: Diagnosis of  By: Nils Pyle CMA (AAMA), Mearl Latin     History of laser assisted in situ keratomileusis 07/25/2012   Hx of transesophageal echocardiography (TEE) for monitoring    TEE (03/2014): No LAA clot, moderate LAE, core triatriatum type structure in RA with no stenosis, normal EF 55-60%,  mild MR   HYPERCHOLESTEROLEMIA 10/13/2010   Qualifier: Diagnosis of  By: Nils Pyle CMA (AAMA), Leisha     Hypertension    Hypothyroidism, postsurgical    Iron Deficiency Anemia 10/14/2010   Qualifier: Diagnosis of  By: Sharlett Iles MD Cline Cools R    Irritable bowel syndrome 10/13/2010   Qualifier: Diagnosis of  By: Nils Pyle CMA (AAMA), Leisha     Lumbar stenosis with neurogenic claudication 04/20/2016   Malignant neoplasm of breast ([place]) 02/25/2016   Migraine    "last one was years ago" (03/31/2014)   MIGRAINE HEADACHE 10/13/2010   Qualifier: Diagnosis of  By: Nils Pyle CMA (AAMA), Leisha     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   Overview:  X(T)    OSA on CPAP    settings at 10-11    OSTEOARTHRITIS 10/13/2010   Qualifier: Diagnosis of  By: Nils Pyle CMA (AAMA), Leisha     Persistent atrial fibrillation 03/31/2015   DLCO dropped from 100% in 2015-50% in 01/2017   Post-surgical hypothyroidism 04/09/2014   RECTAL FISSURE 10/13/2010   Qualifier: Diagnosis of  By: Nils Pyle CMA (AAMA), Mearl Latin     S/P Nissen fundoplication (without gastrostomy tube) procedure 05/25/2017   Sleep apnea    THYROID CANCER 10/13/2010   Qualifier: Diagnosis of  By: Nils Pyle CMA Deborra Medina), Mearl Latin     Thyroid cancer [facility])    s/p thyroidectomy   Type II diabetes mellitus ([place])    type II    Past Surgical History:  Procedure Laterality Date   ABDOMINAL HYSTERECTOMY     "partial"   BREAST BIOPSY Right    BREAST LUMPECTOMY Right    CARDIOVERSION N/A 03/30/2014   Procedure: CARDIOVERSION - BEDSIDE;  Surgeon: [name], MD;  Location: [place];  Service: Cardiovascular;  Laterality: N/A;   CARDIOVERSION N/A 03/31/2014   Procedure: CARDIOVERSION  (BEDSIDE) ;  Surgeon: Lelon Perla, MD;  Location: [place];  Service: Cardiovascular;  Laterality: N/A;   CARDIOVERSION N/A 04/27/2014   Procedure: CARDIOVERSION;  Surgeon: Darlin Coco, MD;  Location: [place];  Service: Cardiovascular;  Laterality: N/A;   CARDIOVERSION N/A 04/13/2015    Procedure: CARDIOVERSION;  Surgeon: Thayer Headings, MD;  Location: [place];  Service: Cardiovascular;  Laterality: N/A;   CARDIOVERSION N/A 01/08/2017   Procedure: CARDIOVERSION;  Surgeon: Sanda Klein, MD;  Location: [facility] ENDOSCOPY;  Service: Cardiovascular;  Laterality: N/A;   CARDIOVERSION N/A 09/19/2017   Procedure: CARDIOVERSION;  Surgeon: [name], MD;  Location: [facility] ENDOSCOPY;  Service: Cardiovascular;  Laterality: N/A;   CARDIOVERSION N/A 10/25/2017   Procedure: CARDIOVERSION;  Surgeon: Nahser, Wonda Cheng, MD;  Location: [facility] ENDOSCOPY;  Service: Cardiovascular;  Laterality: N/A;   CARDIOVERSION N/A 12/27/2018   Procedure: CARDIOVERSION;  Surgeon: [name], MD;  Location: [facility] ENDOSCOPY;  Service: Cardiovascular;  Laterality: N/A;   CARDIOVERSION N/A 05/20/2019   Procedure: CARDIOVERSION;  Surgeon: [name], MD;  Location: [place];  Service: Cardiovascular;  Laterality: N/A;   CARDIOVERSION N/A 12/10/2019   Procedure: CARDIOVERSION;  Surgeon: Pixie Casino, MD;  Location: [place];  Service: Cardiovascular;  Laterality: N/A;   CARDIOVERSION N/A 12/31/2019   Procedure: CARDIOVERSION;  Surgeon: Sanda Klein, MD;  Location: [place];  Service: Cardiovascular;  Laterality: N/A;   CARDIOVERSION N/A 10/21/2020   Procedure: CARDIOVERSION;  Surgeon: [name], MD;  Location: [place];  Service: Cardiovascular;  Laterality: N/A;   CARDIOVERSION N/A 12/03/2020   Procedure: CARDIOVERSION;  Surgeon: Jerline Pain, MD;  Location: [place];  Service: Cardiovascular;  Laterality: N/A;   CARDIOVERSION N/A 07/11/2021   Procedure: CARDIOVERSION;  Surgeon: Pixie Casino, MD;  Location: [facility] ENDOSCOPY;  Service: Cardiovascular;  Laterality: N/A;   CARPAL TUNNEL RELEASE Right    CATARACT EXTRACTION W/ INTRAOCULAR LENS  IMPLANT, BILATERAL Bilateral    COLONOSCOPY     EXCISIONAL HEMORRHOIDECTOMY     INSERTION OF MESH N/A 05/25/2017   Procedure:  INSERTION OF MESH;  Surgeon: Ralene Ok, MD;  Location: [facility];  Service: General;  Laterality: N/A;   TEE WITHOUT CARDIOVERSION N/A 03/30/2014   Procedure: TRANSESOPHAGEAL ECHOCARDIOGRAM (TEE);  Surgeon: [name], MD;  Location: [facility] ENDOSCOPY;  Service: Cardiovascular;  Laterality: N/A;   THYROIDECTOMY     TONSILLECTOMY      Social History:  reports that she has never smoked. She has never used smokeless tobacco. She reports that she does not drink alcohol and does not use drugs.   Allergies  Allergen Reactions   Penicillins Itching, Rash and Other (See Comments)    Did it involve swelling of the face/tongue/throat, SOB, or low BP? No Did it involve sudden or severe rash/hives, skin peeling, or any reaction on the inside of your mouth or nose? No Did you need to seek medical attention at a hospital or doctor's office? No When did it last happen?    30+ years   If all above answers are "NO", may proceed with cephalosporin use.     Family History  Problem Relation Age of Onset   Prostate cancer Father    Diabetes Sister    Heart disease Sister    Heart attack Brother    Heart disease Brother    Diabetes Brother    Diabetes Sister    Diabetes Sister    Pancreatic cancer Sister    Diabetes Brother    Heart disease Brother    Diabetes Brother    Heart disease Brother    Diabetes Brother    Heart disease Brother    Diabetes Brother    Heart disease Brother    Diabetes Brother    Heart disease Brother    Diabetes Brother    Diabetes Child       Prior to Admission medications   Medication Sig Start Date End Date Taking? Authorizing Provider  ACCU-CHEK GUIDE test strip  01/09/20   [provider]  acetaminophen (TYLENOL) 650 MG CR tablet Take 650 mg by mouth 3 (three) times daily.    [provider]  apixaban (ELIQUIS) 5 MG TABS tablet TAKE 1 TABLET TWICE DAILY 09/19/22   Sherran Needs, NP  Blood Glucose Monitoring Suppl (ACCU-CHEK GUIDE ME)  w/Device KIT  01/09/20   [provider]  cetirizine (ZYRTEC) 10 MG tablet Take 10 mg by mouth daily.    [provider]  cholecalciferol (VITAMIN D) 25 MCG (1000 UNIT) tablet Take 1,000 Units by mouth daily.  [provider]  ferrous sulfate 325 (65 FE) MG tablet Take 325 mg by mouth daily with breakfast.    [provider]  fluticasone (FLONASE) 50 MCG/ACT nasal spray Place 1 spray into both nostrils at bedtime as needed for allergies.     [provider]  lansoprazole (PREVACID) 30 MG capsule Take 1 capsule (30 mg total) by mouth 2 (two) times daily before a meal. 03/27/22   Lemmon, Lavone Nian, PA  levothyroxine (SYNTHROID) 125 MCG tablet Take 125 mcg by mouth daily. 11/21/22   [provider]  lipase/protease/amylase (CREON) 36000 UNITS CPEP capsule Take 2 capsules (72,000 Units total) by mouth 3 (three) times daily with meals. May also take 1 capsule (36,000 Units total) as needed (with snacks). 12/08/22   Levin Erp, PA  LORazepam (ATIVAN) 1 MG tablet Take 1.5 mg by mouth at bedtime. Take additional 0.5 mg if needed    [provider]  Lutein 6 MG CAPS Take 6 mg by mouth daily.     [provider]  metFORMIN (GLUCOPHAGE-XR) 500 MG 24 hr tablet Take 500 mg by mouth 2 (two) times daily.    [provider]  metoprolol tartrate (LOPRESSOR) 100 MG tablet TAKE 1 TABLET(100 MG) BY MOUTH TWICE DAILY 11/30/22   Sherran Needs, NP  Polyvinyl Alcohol-Povidone PF 1.4-0.6 % SOLN Place 1 drop into both eyes daily as needed (dry eyes).     [provider]  rosuvastatin (CRESTOR) 10 MG tablet Take 10 mg by mouth at bedtime.    [provider]  sertraline (ZOLOFT) 50 MG tablet Take 50 mg by mouth daily.    [provider]  vitamin B-12 (CYANOCOBALAMIN) 500 MCG tablet Take 500 mcg by mouth daily.    [provider]  zinc gluconate 50 MG tablet Take 50 mg by mouth daily.    [provider]    Physical Exam: BP (!) 145/84   Pulse 79   Temp 97.7 F (36.5 C) (Oral)   Resp (!) 27   LMP  (LMP Unknown)   SpO2 99%   General: 89 y.o. year-old female well developed well nourished in no acute distress.  Alert and interactive. Cardiovascular: Irregular rate and rhythm with no rubs or gallops.  No thyromegaly or JVD noted.  No lower extremity edema. 2/4 pulses in all 4 extremities. Respiratory: Clear to auscultation with no wheezes or rales. Good inspiratory effort. Abdomen: Soft nontender nondistended with normal bowel sounds x4 quadrants. Muskuloskeletal: No cyanosis, clubbing or edema noted bilaterally Neuro: CN II-XII intact, strength, sensation, reflexes Skin: No ulcerative lesions noted or rashes Psychiatry: Judgement and insight appear normal. Mood is appropriate for condition and setting          Labs on Admission:  Basic Metabolic Panel: Recent Labs  Lab 12/11/22 2220  NA 138  K 2.5*  CL 104  CO2 21*  GLUCOSE 143*  BUN 22  CREATININE 0.85  CALCIUM 9.0   Liver Function Tests: No results for input(s): "AST", "ALT", "ALKPHOS", "BILITOT", "PROT", "ALBUMIN" in the last 168 hours. No results for input(s): "LIPASE", "AMYLASE" in the last 168 hours. No results for input(s): "AMMONIA" in the last 168 hours. CBC: Recent Labs  Lab 12/11/22 2220  WBC 6.6  HGB 11.9*  HCT 33.7*  MCV 97.7  PLT 197   Cardiac Enzymes: No results for input(s): "CKTOTAL", "CKMB", "CKMBINDEX", "TROPONINI" in the last 168 hours.  BNP (last 3 results) No results for input(s): "BNP" in  the last 8760 hours.  ProBNP (last 3 results) No results for input(s): "PROBNP" in the last 8760 hours.  CBG: Recent Labs  Lab 12/11/22 2222  GLUCAP 147*    Radiological Exams on Admission: CT Head Wo Contrast  Result Date: 12/12/2022 CLINICAL DATA:  Trauma EXAM: CT HEAD WITHOUT CONTRAST TECHNIQUE: Contiguous axial images were obtained from the base of the skull through the  vertex without intravenous contrast. RADIATION DOSE REDUCTION: This exam was performed according to the departmental dose-optimization program which includes automated exposure control, adjustment of the mA and/or kV according to patient size and/or use of iterative reconstruction technique. COMPARISON:  12/05/2021 FINDINGS: Brain: There is no mass, hemorrhage or extra-axial collection. The size and configuration of the ventricles and extra-axial CSF spaces are normal. There is hypoattenuation of the white matter, most commonly indicating chronic small vessel disease. Vascular: No abnormal hyperdensity of the major intracranial arteries or dural venous sinuses. No intracranial atherosclerosis. Skull: The visualized skull base, calvarium and extracranial soft tissues are normal. Sinuses/Orbits: No fluid levels or advanced mucosal thickening of the visualized paranasal sinuses. No mastoid or middle ear effusion. The orbits are normal. IMPRESSION: No acute intracranial abnormality. Electronically Signed   By: Ulyses Jarred M.D.   On: 12/12/2022 01:00   DG Femur Min 2 Views Left  Result Date: 12/12/2022 CLINICAL DATA:  Fall EXAM: LEFT FEMUR 2 VIEWS COMPARISON:  None Available. FINDINGS: No fracture or dislocation is seen. The joint spaces are preserved. Soft tissues are within normal limits, noting vascular calcifications. IMPRESSION: Negative. Electronically Signed   By: Julian Hy M.D.   On: 12/12/2022 00:56   DG Pelvis 1-2 Views  Result Date: 12/12/2022 CLINICAL DATA:  Fall EXAM: PELVIS - 1-2 VIEW COMPARISON:  None Available. FINDINGS: No fracture or dislocation is seen. Bilateral hip joint spaces are symmetric. Visualized bony pelvis appears intact. Lower lumbar spine fixation hardware, incompletely visualized. IMPRESSION: Negative. Electronically Signed   By: Julian Hy M.D.   On: 12/12/2022 00:56   DG Shoulder Right  Result Date: 12/12/2022 CLINICAL DATA:  Fall EXAM: RIGHT SHOULDER - 2+  VIEW COMPARISON:  None Available. FINDINGS: No fracture or dislocation is seen. Surgical clips in the right axilla. Visualized right lung is clear. IMPRESSION: Negative. Electronically Signed   By: Julian Hy M.D.   On: 12/12/2022 00:56   DG Knee Complete 4 Views Left  Result Date: 12/12/2022 CLINICAL DATA:  Fall EXAM: LEFT KNEE - COMPLETE 4+ VIEW COMPARISON:  None Available. FINDINGS: Severe tricompartmental degenerative changes, most prominent in the patellofemoral compartment. Medial and lateral compartment chondrocalcinosis. No fracture or dislocation is seen. The visualized soft tissues are unremarkable. No suprapatellar knee joint effusion. IMPRESSION: No fracture or dislocation is seen. Severe tricompartmental degenerative changes, as above. Electronically Signed   By: Julian Hy M.D.   On: 12/12/2022 00:55    EKG: I independently viewed the EKG done and my findings are as followed: Atrial fibrillation rate of 83.  Incomplete left bundle branch block.  Nonspecific ST-T changes.  QTc 400.  Assessment/Plan Present on Admission:  Fall  Principal Problem:   Fall  Fall, mechanical Imaging revealed no fractures. Supportive care Analgesics as needed PT OT assessment Fall precautions  Hypokalemia likely due to GI losses Endorses frequent loose stools, possibly contributed by home metformin. Serum potassium 2.5 Repleted intravenously and orally. LR KCl 40 mill equivalent of 50 cc/h x 1 day. Check magnesium level  Type 2 diabetes with hyperglycemia Hold home metformin Obtain hemoglobin  A1c Heart healthy carb modified diet Start insulin sliding scale.  Loose stools, up to 5/day Afebrile with no leukocytosis Possibly side effect from metformin Replace potassium as needed Resume home Creon Consider outpatient follow-up with GI  Mild non anion gap metabolic acidosis Serum bicarb 21, anion gap 9. Gentle IV fluid hydration Repeat BMP in the morning  Persistent  A-fib on Eliquis, rate controlled Resume home Eliquis Rate controlled on metoprolol tartrate twice daily Monitor on telemetry.  Iron deficiency anemia Resume home ferrous sulfate Hemoglobin is stable No reported overt bleeding  Hypothyroidism Resume home levothyroxine  Hyperlipidemia Resume home Crestor  Chronic anxiety/depression Resume home Zoloft  GERD Resume home lansoprazole    DVT prophylaxis: Home Eliquis.  Code Status: Full code  Family Communication: Updated the patient and her personal CNA at bedside  Disposition Plan: Admitted to [place] unit with remote telemetry.  Consults called: None.  Admission status: Observation status.   Status is: Observation    Kayleen Memos MD [facility] Pager [phone]  If 7PM-7AM, please contact night-coverage [email or web address] Password [facility]  12/12/2022, 5:16 AM

## 2022-12-12 NOTE — ED Provider Notes (Incomplete)
[facility] Provider Note   CSN: [id number] Arrival date & time: 12/11/22  2210     History {Add pertinent medical, surgical, social history, OB history to HPI:1} Chief Complaint  Patient presents with  . Weakness  . Fall    [name] is a 89 y.o. female.  HPI     This is an 89 year old female who presents following a fall.  Patient reports that she was walking from 1 end of the house to the other and had turned off the lights.  She became disoriented.  She states she was unable to figure out where she was.  She ultimately lowered herself to the floor.  She states that she did not truly fall but landed on her buttock.  She is on Eliquis.  She denies hitting her head or loss of consciousness.  Since the fall she is having some left leg pain.  She also previously fell and was having some right shoulder pain.  Home Medications Prior to Admission medications   Medication Sig Start Date End Date Taking? Authorizing Provider  ACCU-CHEK GUIDE test strip  01/09/20   [provider]  acetaminophen (TYLENOL) 650 MG CR tablet Take 650 mg by mouth 3 (three) times daily.    [provider]  apixaban (ELIQUIS) 5 MG TABS tablet TAKE 1 TABLET TWICE DAILY 09/19/22   Sherran Needs, NP  Blood Glucose Monitoring Suppl (ACCU-CHEK GUIDE ME) w/Device KIT  01/09/20   [provider]  cetirizine (ZYRTEC) 10 MG tablet Take 10 mg by mouth daily.    [provider]  cholecalciferol (VITAMIN D) 25 MCG (1000 UNIT) tablet Take 1,000 Units by mouth daily.    [provider]  ferrous sulfate 325 (65 FE) MG tablet Take 325 mg by mouth daily with breakfast.    [provider]  fluticasone (FLONASE) 50 MCG/ACT nasal spray Place 1 spray into both nostrils at bedtime as needed for allergies.     [provider]  lansoprazole (PREVACID) 30 MG capsule Take 1 capsule (30 mg total) by mouth 2 (two) times daily  before a meal. 03/27/22   Lemmon, Lavone Nian, PA  levothyroxine (SYNTHROID) 125 MCG tablet Take 125 mcg by mouth daily. 11/21/22   [provider]  lipase/protease/amylase (CREON) 36000 UNITS CPEP capsule Take 2 capsules (72,000 Units total) by mouth 3 (three) times daily with meals. May also take 1 capsule (36,000 Units total) as needed (with snacks). 12/08/22   Levin Erp, PA  LORazepam (ATIVAN) 1 MG tablet Take 1.5 mg by mouth at bedtime. Take additional 0.5 mg if needed    [provider]  Lutein 6 MG CAPS Take 6 mg by mouth daily.     [provider]  metFORMIN (GLUCOPHAGE-XR) 500 MG 24 hr tablet Take 500 mg by mouth 2 (two) times daily.    [provider]  metoprolol tartrate (LOPRESSOR) 100 MG tablet TAKE 1 TABLET(100 MG) BY MOUTH TWICE DAILY 11/30/22   Sherran Needs, NP  Polyvinyl Alcohol-Povidone PF 1.4-0.6 % SOLN Place 1 drop into both eyes daily as needed (dry eyes).     [provider]  rosuvastatin (CRESTOR) 10 MG tablet Take 10 mg by mouth at bedtime.    [provider]  sertraline (ZOLOFT) 50 MG tablet Take 50 mg by mouth daily.    [provider]  vitamin B-12 (CYANOCOBALAMIN) 500 MCG tablet Take 500 mcg by mouth daily.  [provider]  zinc gluconate 50 MG tablet Take 50 mg by mouth daily.    [provider]      Allergies    Penicillins    Review of Systems   Review of Systems  Physical Exam Updated Vital Signs BP 137/87 (BP Location: Left Arm)   Pulse 74   Temp 97.6 F (36.4 C) (Oral)   Resp 18   LMP  (LMP Unknown)   SpO2 96%  Physical Exam  ED Results / Procedures / Treatments   Labs (all labs ordered are listed, but only abnormal results are displayed) Labs Reviewed  BASIC METABOLIC PANEL - Abnormal; Notable for the following components:      Result Value   Potassium 2.5 (*)    CO2 21 (*)    Glucose, Bld 143 (*)    All other components within normal limits   CBC - Abnormal; Notable for the following components:   RBC 3.45 (*)    Hemoglobin 11.9 (*)    HCT 33.7 (*)    MCH 34.5 (*)    All other components within normal limits  CBG MONITORING, ED - Abnormal; Notable for the following components:   Glucose-Capillary 147 (*)    All other components within normal limits  URINALYSIS, ROUTINE W REFLEX MICROSCOPIC  CBG MONITORING, ED    EKG None  Radiology No results found.  Procedures Procedures  {Document cardiac monitor, telemetry assessment procedure when appropriate:1}  Medications Ordered in ED Medications  potassium chloride 10 mEq in 100 mL IVPB (has no administration in time range)  potassium chloride SA (KLOR-CON M) CR tablet 40 mEq (has no administration in time range)    ED Course/ Medical Decision Making/ A&P   {   Click here for ABCD2, HEART and other calculatorsREFRESH Note before signing :1}                          Medical Decision Making Amount and/or Complexity of Data Reviewed Labs: ordered. Radiology: ordered.  Risk Prescription drug management.   ***  {Document critical care time when appropriate:1} {Document review of labs and clinical decision tools ie heart score, Chads2Vasc2 etc:1}  {Document your independent review of radiology images, and any outside records:1} {Document your discussion with family members, caretakers, and with consultants:1} {Document social determinants of health affecting pt's care:1} {Document your decision making why or why not admission, treatments were needed:1} Final Clinical Impression(s) / ED Diagnoses Final diagnoses:  None    Rx / DC Orders ED Discharge Orders     None

## 2022-12-13 DIAGNOSIS — R5381 Other malaise: Secondary | ICD-10-CM

## 2022-12-13 DIAGNOSIS — R197 Diarrhea, unspecified: Secondary | ICD-10-CM | POA: Insufficient documentation

## 2022-12-13 DIAGNOSIS — W19XXXA Unspecified fall, initial encounter: Secondary | ICD-10-CM | POA: Diagnosis not present

## 2022-12-13 DIAGNOSIS — E039 Hypothyroidism, unspecified: Secondary | ICD-10-CM

## 2022-12-13 DIAGNOSIS — E876 Hypokalemia: Principal | ICD-10-CM | POA: Insufficient documentation

## 2022-12-13 LAB — CBC WITH DIFFERENTIAL/PLATELET
Abs Immature Granulocytes: 0.05 10*3/uL (ref 0.00–0.07)
Basophils Absolute: 0 10*3/uL (ref 0.0–0.1)
Basophils Relative: 1 %
Eosinophils Absolute: 0.1 10*3/uL (ref 0.0–0.5)
Eosinophils Relative: 2 %
HCT: 36 % (ref 36.0–46.0)
Hemoglobin: 12.8 g/dL (ref 12.0–15.0)
Immature Granulocytes: 1 %
Lymphocytes Relative: 12 %
Lymphs Abs: 0.9 10*3/uL (ref 0.7–4.0)
MCH: 34.5 pg — ABNORMAL HIGH (ref 26.0–34.0)
MCHC: 35.6 g/dL (ref 30.0–36.0)
MCV: 97 fL (ref 80.0–100.0)
Monocytes Absolute: 0.7 10*3/uL (ref 0.1–1.0)
Monocytes Relative: 9 %
Neutro Abs: 5.5 10*3/uL (ref 1.7–7.7)
Neutrophils Relative %: 75 %
Platelets: 225 10*3/uL (ref 150–400)
RBC: 3.71 MIL/uL — ABNORMAL LOW (ref 3.87–5.11)
RDW: 13.5 % (ref 11.5–15.5)
WBC: 7.3 10*3/uL (ref 4.0–10.5)
nRBC: 0 % (ref 0.0–0.2)

## 2022-12-13 LAB — GLUCOSE, CAPILLARY
Glucose-Capillary: 145 mg/dL — ABNORMAL HIGH (ref 70–99)
Glucose-Capillary: 145 mg/dL — ABNORMAL HIGH (ref 70–99)
Glucose-Capillary: 172 mg/dL — ABNORMAL HIGH (ref 70–99)
Glucose-Capillary: 127 mg/dL — ABNORMAL HIGH (ref 70–99)

## 2022-12-13 LAB — CK: Total CK: 166 U/L (ref 38–234)

## 2022-12-13 LAB — BASIC METABOLIC PANEL
Anion gap: 11 (ref 5–15)
Anion gap: 11 (ref 5–15)
BUN: 10 mg/dL (ref 8–23)
BUN: 10 mg/dL (ref 8–23)
CO2: 25 mmol/L (ref 22–32)
CO2: 22 mmol/L (ref 22–32)
Calcium: 8.6 mg/dL — ABNORMAL LOW (ref 8.9–10.3)
Calcium: 8.5 mg/dL — ABNORMAL LOW (ref 8.9–10.3)
Chloride: 107 mmol/L (ref 98–111)
Chloride: 106 mmol/L (ref 98–111)
Creatinine, Ser: 0.75 mg/dL (ref 0.44–1.00)
Creatinine, Ser: 0.73 mg/dL (ref 0.44–1.00)
GFR, Estimated: >60 mL/min (ref >60)
GFR, Estimated: >60 mL/min (ref >60)
Glucose, Bld: 138 mg/dL — ABNORMAL HIGH (ref 70–99)
Glucose, Bld: 156 mg/dL — ABNORMAL HIGH (ref 70–99)
Potassium: 2.6 mmol/L — CL (ref 3.5–5.1)
Potassium: 3.1 mmol/L — ABNORMAL LOW (ref 3.5–5.1)
Sodium: 143 mmol/L (ref 135–145)
Sodium: 139 mmol/L (ref 135–145)

## 2022-12-13 LAB — T4, FREE: Free T4: 0.82 ng/dL (ref 0.61–1.12)

## 2022-12-13 LAB — TSH: TSH: 43.641 u[IU]/mL — ABNORMAL HIGH (ref 0.350–4.500)

## 2022-12-13 LAB — AMMONIA: Ammonia: 11 umol/L (ref 9–35)

## 2022-12-13 LAB — MAGNESIUM: Magnesium: 1.7 mg/dL (ref 1.7–2.4)

## 2022-12-13 MED ORDER — POTASSIUM CHLORIDE CRYS ER 20 MEQ PO TBCR
40.0000 meq | EXTENDED_RELEASE_TABLET | ORAL | Status: AC
  Administered 2022-12-13 (×2): 40 meq via ORAL
  Filled 2022-12-13 (×2): qty 2

## 2022-12-13 MED ORDER — MAGNESIUM SULFATE 2 GM/50ML IV SOLN
2.0000 g | Freq: Once | INTRAVENOUS | Status: AC
  Administered 2022-12-13: 2 g via INTRAVENOUS
  Filled 2022-12-13: qty 50

## 2022-12-13 MED ORDER — POTASSIUM CHLORIDE CRYS ER 20 MEQ PO TBCR
40.0000 meq | EXTENDED_RELEASE_TABLET | Freq: Once | ORAL | Status: AC
  Administered 2022-12-13: 40 meq via ORAL
  Filled 2022-12-13: qty 2

## 2022-12-13 MED ORDER — HALOPERIDOL LACTATE 5 MG/ML IJ SOLN
2.0000 mg | Freq: Four times a day (QID) | INTRAMUSCULAR | Status: DC | PRN
  Administered 2022-12-13 – 2022-12-14 (×2): 2 mg via INTRAVENOUS
  Filled 2022-12-13 (×2): qty 1

## 2022-12-13 NOTE — Progress Notes (Signed)
Progress Note    [name]   YIR:[id number]  DOB: 03-Aug-1931  DOA: 12/11/2022     1 PCP: Lavone Orn, MD  Initial CC: Fall, weakness  Hospital Course: [name] is an 89 year old female with PMH permanent A-fib on Eliquis, DM II, HTN, HLD, hypothyroidism, IDA, chronic anxiety/depression who presented after a fall at home. She has been having ongoing intermittent falls associated with worsening weakness.  She does have a caregiver, CNA at home as well.  She has also been having ongoing diarrhea and is on Creon.  She does endorse some mild improvement in her diarrhea after starting Creon recently. She was admitted for electrolyte replacement and PT/OT evaluation.  Interval History:  Some sundowning/delirium overnight. Better this morning after haldol was given last night. Slightly slowed mentation as expected. Family present bedside as well as her home CNA. I also called and updated her daughter after seeing patient this morning.  Overall, she's improved and has been seen by PT with rec's for 24/7 care at home if avail. This is being worked on.  Also spoke with patient regarding her Synthroid and how she takes her dose.  She was able to describe proper administration instructions.  We are still concerned about her forgetting to take some doses and she does not use a pill dispenser, simply takes medication from the bottles.  Recommended family count pills in her bottles to see if counts are accurate in regards to last fill.   Assessment and Plan:  Fall, mechanical Imaging revealed no fractures. Supportive care Analgesics as needed -PT/OT recommending 24/7 care at home   Hypokalemia likely due to GI losses Endorses frequent loose stools, possibly contributed by home metformin. -Repeating as necessary   Type 2 diabetes with hyperglycemia Hold home metformin -A1c 5.9%. Likely could try diet control and see how diarrhea also responds to being off metformin -Continue SSI and  CBG monitoring   Loose stools, up to 5/day Afebrile with no leukocytosis Possibly side effect from metformin vs pancreatic insufficiency or combo - continue creon - continue holding metformin at discharge   Hypothyroidism - TSH elevated 43.641 - FT4 normal, 0.82 - possible TSH elevation in setting of acute illness but FT4 slightly LLN; for now I'm concerned about missed doses rather than underdosed and do not want to increase yet - recommended to family to count pills left in bottle and compare to last fill and since she'll have 24/7 care or at least med assistance at discharge, to work on consistent administration for 3-4 weeks then repeat thryoid studies before dose adjusting any - I've explained this to Shirlean Mylar on the phone on 1/31   Mild non anion gap metabolic acidosis - resolved  -Suspect due to GI losses -Continue hydration and encouraging oral intake   Persistent A-fib on Eliquis, rate controlled Resume home Eliquis and lopressor Monitor on telemetry.   Iron deficiency anemia Resume home ferrous sulfate Hemoglobin is stable No reported overt bleeding  Hyperlipidemia Resume home Crestor   Chronic anxiety/depression Resume home Zoloft   GERD Resume home lansoprazole  Old records reviewed in assessment of this patient  Antimicrobials:   DVT prophylaxis:   apixaban (ELIQUIS) tablet 5 mg   Code Status:   Code Status: Full Code  Mobility Assessment (last 72 hours)     Mobility Assessment     Row Name 12/13/22 1058 12/12/22 2112 12/12/22 1251 12/12/22 0800     Does patient have an order for bedrest or is patient medically unstable  No - Continue assessment No - Continue assessment -- --    What is the highest level of mobility based on the progressive mobility assessment? Level 5 (Walks with assist in room/hall) - Balance while stepping forward/back and can walk in room with assist - Complete Level 5 (Walks with assist in room/hall) - Balance while stepping  forward/back and can walk in room with assist - Complete Level 5 (Walks with assist in room/hall) - Balance while stepping forward/back and can walk in room with assist - Complete Level 3 (Stands with assist) - Balance while standing  and cannot march in place             Barriers to discharge:  Disposition Plan:  Home with House Status is: Inpt  Objective: Blood pressure (!) 129/93, pulse 84, temperature 97.6 F (36.4 C), temperature source Oral, resp. rate 18, height '5\' 6"'$  (1.676 m), weight 66.9 kg, SpO2 93 %.  Examination:  Physical Exam Constitutional:      General: She is not in acute distress.    Appearance: Normal appearance.  HENT:     Head: Normocephalic and atraumatic.     Mouth/Throat:     Mouth: Mucous membranes are moist.  Eyes:     Extraocular Movements: Extraocular movements intact.  Pulmonary:     Effort: Pulmonary effort is normal.     Breath sounds: Normal breath sounds.  Abdominal:     General: Bowel sounds are normal. There is no distension.     Palpations: Abdomen is soft.     Tenderness: There is no abdominal tenderness.  Musculoskeletal:        General: Normal range of motion.     Cervical back: Normal range of motion and neck supple.  Skin:    General: Skin is warm and dry.  Neurological:     General: No focal deficit present.     Mental Status: She is alert.  Psychiatric:        Mood and Affect: Mood normal.      Consultants:    Procedures:    Data Reviewed: Results for orders placed or performed during the hospital encounter of 12/11/22 (from the past 24 hour(s))  CBG monitoring, ED     Status: Abnormal   Collection Time: 12/12/22  5:38 PM  Result Value Ref Range   Glucose-Capillary 166 (H) 70 - 99 mg/dL  CBG monitoring, ED     Status: Abnormal   Collection Time: 12/12/22  9:20 PM  Result Value Ref Range   Glucose-Capillary 118 (H) 70 - 99 mg/dL  T4, free     Status: None   Collection Time: 12/13/22  2:17 AM  Result Value Ref  Range   Free T4 0.82 0.61 - 1.12 ng/dL  Basic metabolic panel     Status: Abnormal   Collection Time: 12/13/22  2:20 AM  Result Value Ref Range   Sodium 143 135 - 145 mmol/L   Potassium 2.6 (LL) 3.5 - 5.1 mmol/L   Chloride 107 98 - 111 mmol/L   CO2 25 22 - 32 mmol/L   Glucose, Bld 138 (H) 70 - 99 mg/dL   BUN 10 8 - 23 mg/dL   Creatinine, Ser 0.75 0.44 - 1.00 mg/dL   Calcium 8.6 (L) 8.9 - 10.3 mg/dL   GFR, Estimated >60 >60 mL/min   Anion gap 11 5 - 15  CBC with Differential/Platelet     Status: Abnormal   Collection Time: 12/13/22  2:20 AM  Result Value  Ref Range   WBC 7.3 4.0 - 10.5 K/uL   RBC 3.71 (L) 3.87 - 5.11 MIL/uL   Hemoglobin 12.8 12.0 - 15.0 g/dL   HCT 36.0 36.0 - 46.0 %   MCV 97.0 80.0 - 100.0 fL   MCH 34.5 (H) 26.0 - 34.0 pg   MCHC 35.6 30.0 - 36.0 g/dL   RDW 13.5 11.5 - 15.5 %   Platelets 225 150 - 400 K/uL   nRBC 0.0 0.0 - 0.2 %   Neutrophils Relative % 75 %   Neutro Abs 5.5 1.7 - 7.7 K/uL   Lymphocytes Relative 12 %   Lymphs Abs 0.9 0.7 - 4.0 K/uL   Monocytes Relative 9 %   Monocytes Absolute 0.7 0.1 - 1.0 K/uL   Eosinophils Relative 2 %   Eosinophils Absolute 0.1 0.0 - 0.5 K/uL   Basophils Relative 1 %   Basophils Absolute 0.0 0.0 - 0.1 K/uL   Immature Granulocytes 1 %   Abs Immature Granulocytes 0.05 0.00 - 0.07 K/uL  Magnesium     Status: None   Collection Time: 12/13/22  2:20 AM  Result Value Ref Range   Magnesium 1.7 1.7 - 2.4 mg/dL  CK     Status: None   Collection Time: 12/13/22  2:20 AM  Result Value Ref Range   Total CK 166 38 - 234 U/L  TSH     Status: Abnormal   Collection Time: 12/13/22  2:20 AM  Result Value Ref Range   TSH 43.641 (H) 0.350 - 4.500 uIU/mL  Ammonia     Status: None   Collection Time: 12/13/22  2:20 AM  Result Value Ref Range   Ammonia 11 9 - 35 umol/L  Glucose, capillary     Status: Abnormal   Collection Time: 12/13/22  8:54 AM  Result Value Ref Range   Glucose-Capillary 145 (H) 70 - 99 mg/dL   Comment 1 Notify RN     Comment 2 Document in Chart   Glucose, capillary     Status: Abnormal   Collection Time: 12/13/22 11:43 AM  Result Value Ref Range   Glucose-Capillary 145 (H) 70 - 99 mg/dL  Glucose, capillary     Status: Abnormal   Collection Time: 12/13/22  3:59 PM  Result Value Ref Range   Glucose-Capillary 172 (H) 70 - 99 mg/dL   Comment 1 Notify RN    Comment 2 Document in Chart     I have reviewed pertinent nursing notes, vitals, labs, and images as necessary. I have ordered labwork to follow up on as indicated.  I have reviewed the last notes from staff over past 24 hours. I have discussed patient's care plan and test results with nursing staff, CM/SW, and other staff as appropriate.  Time spent: Greater than 50% of the 55 minute visit was spent in counseling/coordination of care for the patient as laid out in the A&P.   LOS: 1 day   Dwyane Dee, MD [facility] 12/13/2022, 4:45 PM

## 2022-12-13 NOTE — Plan of Care (Signed)

## 2022-12-13 NOTE — TOC Initial Note (Signed)
Transition of Care [facility]) - Initial/Assessment Note    Patient Details  Name: [name] MRN: [id number] Date of Birth: 05/27/1931  Transition of Care [facility]) CM/SW Contact:    Carles Collet, RN Phone Number: 12/13/2022, 11:27 AM  Clinical Narrative:                  Spoke with patient's grandson Quillian Quince and patien's sitter from [place] to discuss current home support and needs for DC. Patient has sitter 2 days a week for 3 hour visits. Daniel and sitter state that family will be able to figure out 24 hour supervision between them for the patient when she discharges.  Discussed DME needs, she has a RW, cane, Rollator, and bedside commode at home, no new DME needed for DC.  HH discussed, and family and I reviewed Medicare ratings and they chose [place]. Referral accepted. MD discussed with daughter, and DC will be planned for tomorrow. Family is able to transport home.   Expected Discharge Plan: [place] Barriers to Discharge: Continued Medical Work up   Patient Goals and CMS Choice Patient states their goals for this hospitalization and ongoing recovery are:: return home CMS Medicare.gov Compare Post Acute Care list provided to:: Other (Comment Required) Choice offered to / list presented to : Adult Children (Grandson at [place] and caregiver CNA from [place] participated in discussion)      Expected Discharge Plan and Services   Discharge Planning Services: CM Consult Post Acute Care Choice: [place] arrangements for the past 2 months: Single Family Home                 DME Arranged: N/A         HH Arranged: RN, PT, OT [place] Agency: [place] Date [facility] Agency Contacted: 12/13/22 Time [place]: 1127 Representative spoke with at [place]: Tommi Rumps  Prior Living Arrangements/Services Living arrangements for the past 2 months: [place] Lives with:: Self              Current home services: DME,  Actuary    Activities of Daily Living Home Assistive Devices/Equipment: Other (Comment) ADL Screening (condition at time of admission) Patient's cognitive ability adequate to safely complete daily activities?: No Is the patient deaf or have difficulty hearing?: No Does the patient have difficulty seeing, even when wearing glasses/contacts?: No Does the patient have difficulty concentrating, remembering, or making decisions?: Yes Patient able to express need for assistance with ADLs?: Yes Does the patient have difficulty dressing or bathing?: No Independently performs ADLs?: Yes (appropriate for developmental age) Does the patient have difficulty walking or climbing stairs?: Yes Weakness of Legs: Both Weakness of Arms/Hands: None  Permission Sought/Granted                  Emotional Assessment              Admission diagnosis:  Hypokalemia [E87.6] Fall [W19.XXXA] Generalized weakness [R53.1] Frequent falls [R29.6] Patient Active Problem List   Diagnosis Date Noted   Fall 12/12/2022   Atypical atrial flutter ([place]) 06/13/2021   Secondary hypercoagulable state ([place]) 06/13/2021   Afib ([place]) 10/19/2020   A-fib ([place])    Dilated cardiomyopathy ([place]) 08/27/2018   Acute bronchitis 11/15/2017   S/P Nissen fundoplication (without gastrostomy tube) procedure 05/25/2017   Persistent atrial fibrillation (HCC)    Chronic cough 01/02/2017   Lumbar stenosis with neurogenic claudication 04/20/2016   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  Malignant neoplasm of breast ([place]) 02/25/2016   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   Post-surgical hypothyroidism 04/09/2014   Anticoagulated by anticoagulation treatment - Eliquis 03/30/2014   Diabetes mellitus without complication ([place])    Heart murmur    DJD (degenerative joint disease) of knee    Glaucoma    History of laser assisted in situ keratomileusis 07/25/2012   Degenerative disorder of eye 07/25/2012   Exotropia,  intermittent 07/09/2012   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   Error, refractive, myopia 07/09/2012   ANEMIA, IRON DEFICIENCY 10/14/2010   GERD 10/14/2010   THYROID CANCER 10/13/2010   HYPERCHOLESTEROLEMIA 10/13/2010   ANEMIA, UNSPECIFIED 10/13/2010   Anxiety state 10/13/2010   MIGRAINE HEADACHE 10/13/2010   Essential hypertension 10/13/2010   EROSIVE ESOPHAGITIS 10/13/2010   BARRETTS ESOPHAGUS 10/13/2010   Hiatal hernia s/p robotic repair & fundoplication 06/13/6552 74/82/7078   DIVERTICULOSIS, COLON 10/13/2010   Irritable bowel syndrome 10/13/2010   OSTEOARTHRITIS 10/13/2010   CARDIAC MURMUR 10/13/2010   COLONIC POLYPS, ADENOMATOUS, HX OF 10/13/2010   PCP:  Lavone Orn, MD Pharmacy:   [facility] [place], [place] [address] [place] [address] Phone: [phone] Fax: [phone]  [address] [place], [place] [address] Phone: [phone] Fax: [phone]     Social Determinants of Health (SDOH) Social History: SDOH Screenings   Tobacco Use: Low Risk  (12/11/2022)   SDOH Interventions:     Readmission Risk Interventions     No data to display

## 2022-12-14 DIAGNOSIS — E890 Postprocedural hypothyroidism: Secondary | ICD-10-CM

## 2022-12-14 DIAGNOSIS — R197 Diarrhea, unspecified: Secondary | ICD-10-CM

## 2022-12-14 DIAGNOSIS — I10 Essential (primary) hypertension: Secondary | ICD-10-CM | POA: Diagnosis not present

## 2022-12-14 DIAGNOSIS — I4819 Other persistent atrial fibrillation: Secondary | ICD-10-CM

## 2022-12-14 DIAGNOSIS — W19XXXA Unspecified fall, initial encounter: Secondary | ICD-10-CM | POA: Diagnosis not present

## 2022-12-14 LAB — BASIC METABOLIC PANEL
Anion gap: 9 (ref 5–15)
BUN: 10 mg/dL (ref 8–23)
CO2: 26 mmol/L (ref 22–32)
Calcium: 8.4 mg/dL — ABNORMAL LOW (ref 8.9–10.3)
Chloride: 106 mmol/L (ref 98–111)
Creatinine, Ser: 0.54 mg/dL (ref 0.44–1.00)
GFR, Estimated: >60 mL/min (ref >60)
Glucose, Bld: 147 mg/dL — ABNORMAL HIGH (ref 70–99)
Potassium: 3.1 mmol/L — ABNORMAL LOW (ref 3.5–5.1)
Sodium: 141 mmol/L (ref 135–145)

## 2022-12-14 LAB — T3: T3, Total: 53 ng/dL — ABNORMAL LOW (ref 71–180)

## 2022-12-14 LAB — CBC WITH DIFFERENTIAL/PLATELET
Abs Immature Granulocytes: 0.02 10*3/uL (ref 0.00–0.07)
Basophils Absolute: 0.1 10*3/uL (ref 0.0–0.1)
Basophils Relative: 1 %
Eosinophils Absolute: 0.3 10*3/uL (ref 0.0–0.5)
Eosinophils Relative: 4 %
HCT: 31.6 % — ABNORMAL LOW (ref 36.0–46.0)
Hemoglobin: 11 g/dL — ABNORMAL LOW (ref 12.0–15.0)
Immature Granulocytes: 0 %
Lymphocytes Relative: 11 %
Lymphs Abs: 0.9 10*3/uL (ref 0.7–4.0)
MCH: 33.8 pg (ref 26.0–34.0)
MCHC: 34.8 g/dL (ref 30.0–36.0)
MCV: 97.2 fL (ref 80.0–100.0)
Monocytes Absolute: 0.8 10*3/uL (ref 0.1–1.0)
Monocytes Relative: 10 %
Neutro Abs: 5.9 10*3/uL (ref 1.7–7.7)
Neutrophils Relative %: 74 %
Platelets: 188 10*3/uL (ref 150–400)
RBC: 3.25 MIL/uL — ABNORMAL LOW (ref 3.87–5.11)
RDW: 13.6 % (ref 11.5–15.5)
WBC: 7.8 10*3/uL (ref 4.0–10.5)
nRBC: 0 % (ref 0.0–0.2)

## 2022-12-14 LAB — GLUCOSE, CAPILLARY: Glucose-Capillary: 135 mg/dL — ABNORMAL HIGH (ref 70–99)

## 2022-12-14 LAB — MAGNESIUM: Magnesium: 1.9 mg/dL (ref 1.7–2.4)

## 2022-12-14 MED ORDER — METOPROLOL TARTRATE 5 MG/5ML IV SOLN: 5.0000 mg | Freq: Once | INTRAVENOUS | Status: DC

## 2022-12-14 MED ORDER — METOPROLOL TARTRATE 12.5 MG HALF TABLET
12.5000 mg | ORAL_TABLET | Freq: Once | ORAL | Status: AC
  Administered 2022-12-14: 12.5 mg via ORAL
  Filled 2022-12-14: qty 1

## 2022-12-14 MED ORDER — APIXABAN 5 MG PO TABS
5.0000 mg | ORAL_TABLET | Freq: Once | ORAL | Status: AC
  Administered 2022-12-14: 5 mg via ORAL
  Filled 2022-12-14: qty 1

## 2022-12-14 MED ORDER — POTASSIUM CHLORIDE CRYS ER 20 MEQ PO TBCR
40.0000 meq | EXTENDED_RELEASE_TABLET | Freq: Once | ORAL | Status: AC
  Administered 2022-12-14: 40 meq via ORAL
  Filled 2022-12-14: qty 2

## 2022-12-14 MED ORDER — ROSUVASTATIN CALCIUM 5 MG PO TABS
10.0000 mg | ORAL_TABLET | Freq: Once | ORAL | Status: AC
  Administered 2022-12-14: 10 mg via ORAL
  Filled 2022-12-14: qty 2

## 2022-12-14 MED ORDER — IPRATROPIUM-ALBUTEROL 0.5-2.5 (3) MG/3ML IN SOLN: 3.0000 mL | RESPIRATORY_TRACT | Status: DC | PRN

## 2022-12-14 NOTE — TOC Progression Note (Addendum)
Transition of Care [facility]) - Progression Note    Patient Details  Name: [name] MRN: [id number] Date of Birth: Oct 04, 1931  Transition [facility]) CM/SW Contact  Samie Barclift, Edson Snowball, RN Phone Number: 12/14/2022, 4:22 PM  Clinical Narrative:      Patient discharging to her daughter's home, address: [place], [facility] [phone]   Daughter Robin phone number [phone]   NCM has left a message with Tommi Rumps with [facility] to see if they can still service patient. Will update Robin once NCM hears back.   Cory with [facility] can still accept patient and has Robin's contact information. Sandre Kitty Expected Discharge Plan: [place] Barriers to Discharge: Continued Medical Work up  Expected Discharge Plan and Services   Discharge Planning Services: CM Consult Post Acute Care Choice: [place] arrangements for the past 2 months: Single Family Home Expected Discharge Date: 12/14/22               DME Arranged: N/A         HH Arranged: RN, PT, OT HH Agency: [place] Date [facility] Agency Contacted: 12/13/22 Time [place]: 1127 Representative spoke with at [place]: [place] Determinants of Health ([place]) Interventions SDOH Screenings   Tobacco Use: Low Risk  (12/11/2022)    Readmission Risk Interventions     No data to display

## 2022-12-14 NOTE — Progress Notes (Signed)
Patient left unit accompanied by family and staff.  All questions and concerns addressed.  Patient has all belongings with her.

## 2022-12-14 NOTE — Progress Notes (Signed)
Physical Therapy Treatment Patient Details Name: [name] MRN: [id number] DOB: January 20, 1931 Today's Date: 12/14/2022   History of Present Illness Pt is an 89 y/o female presenting after a fall at home. No acute fx from fall. Admitted for hypokalemia. PMH: a fib, DM2, HTN, HLD, hypothyroidism, anemia, anxiety/depression.    PT Comments    Pt was received supine with daughter present and agreeable to therapy. Pt participated in rolling for hygiene and sat EOB with up to min A for LE management. Pt able to independently perform a bridge x2 in bed for hygiene. Pt with difficulty following verbal cues due to being [facility] requiring increased physical assistance for RW management and foot placement during gait and stair trial. Pt very anxious during stair trial due to not being able to see the step, requiring physical placement of foot on/off step. Family was educated on guarding and assistance techniques and were able to demonstrate them during session. Daughter notified PTA that the pt will be staying with her at discharge, so case manager was notified for [facility] discharge planning. Pt continues to benefit from PT services to progress toward functional mobility goals.    Recommendations for follow up therapy are one component of a multi-disciplinary discharge planning process, led by the attending physician.  Recommendations may be updated based on patient status, additional functional criteria and insurance authorization.  Follow Up Recommendations  Home health PT     Assistance Recommended at Discharge Frequent or constant Supervision/Assistance  Patient can return home with the following A little help with walking and/or transfers;A little help with bathing/dressing/bathroom;Assistance with cooking/housework;Direct supervision/assist for medications management;Assist for transportation;Help with stairs or ramp for entrance   Equipment Recommendations  None recommended by PT    Recommendations for  Other Services       Precautions / Restrictions Precautions Precautions: Fall Restrictions Weight Bearing Restrictions: No     Mobility  Bed Mobility Overal bed mobility: Needs Assistance Bed Mobility: Supine to Sit, Rolling Rolling: Min guard   Supine to sit: Min assist, HOB elevated (+2 for safety)          Transfers Overall transfer level: Needs assistance Equipment used: Rolling walker (2 wheels) Transfers: Sit to/from Stand Sit to Stand: +2 physical assistance, Mod assist (Pt with both hands on RW dispite cues to push up from bed)           General transfer comment: Pt initiated standing and did not follow cues to push from bed due to being [facility], so increased physical assist was required    Ambulation/Gait Ambulation/Gait assistance: Min assist, +2 physical assistance (Pt required cues and assistance with RW management and sequencing. Pt required min A for upright posture due to weakness. Pt's grandson present and assisting with gait belt.) Gait Distance (Feet): 14 Feet Assistive device: Rolling walker (2 wheels) Gait Pattern/deviations: Decreased stride length, Step-to pattern, Shuffle       General Gait Details: Min A for upright posture and cues for RW management   Stairs Stairs: Yes Stairs assistance: Mod assist, +2 safety/equipment (Education with grandson on stair technique. Pt required assistance with foot placement on the curb step when ascending and on the floor when descending.) Stair Management: With walker Number of Stairs: 1 (curb step) General stair comments: Pt was very anxious during stair trial and had difficulty placing foot onto and off of stair requiring physical assistance. Pt very forward flexed attempting to look at feet stating "I can't see the step".  Balance Overall balance assessment: Needs assistance Sitting-balance support: No upper extremity supported, Feet supported Sitting balance-Leahy Scale: Fair Sitting balance -  Comments: sitting EOB   Standing balance support: Bilateral upper extremity supported, During functional activity Standing balance-Leahy Scale: Poor Standing balance comment: with RW support                            Cognition Arousal/Alertness: Awake/alert Behavior During Therapy: WFL for tasks assessed/performed Overall Cognitive Status: No family/caregiver present to determine baseline cognitive functioning                                 General Comments: anxious, guarding, needs dense cues due to [facility] and fear of falls/poor vision        Exercises      General Comments General comments (skin integrity, edema, etc.): Pt O2 on RA WNL and HR 130-136bpm sitting and in 150s bpm after stair trial.      Pertinent Vitals/Pain Pain Assessment Faces Pain Scale: Hurts even more Pain Location: R shoulder Pain Descriptors / Indicators: Sore, Grimacing, Guarding, Discomfort     PT Goals (current goals can now be found in the care plan section) Acute Rehab PT Goals Patient Stated Goal: stop falling but remain at home PT Goal Formulation: With patient Time For Goal Achievement: 12/26/22 Potential to Achieve Goals: Good Progress towards PT goals: Progressing toward goals    Frequency    Min 3X/week      PT Plan Current plan remains appropriate       AM-PAC PT "6 Clicks" Mobility   Outcome Measure  Help needed turning from your back to your side while in a flat bed without using bedrails?: A Little Help needed moving from lying on your back to sitting on the side of a flat bed without using bedrails?: A Little Help needed moving to and from a bed to a chair (including a wheelchair)?: A Lot Help needed standing up from a chair using your arms (e.g., wheelchair or bedside chair)?: A Lot Help needed to walk in hospital room?: A Little Help needed climbing 3-5 steps with a railing? : A Lot 6 Click Score: 15    End of Session Equipment Utilized  During Treatment: Gait belt Activity Tolerance: Patient tolerated treatment well Patient left: with family/visitor present;in chair;with call bell/phone within reach Nurse Communication: Mobility status PT Visit Diagnosis: Unsteadiness on feet (R26.81);Muscle weakness (generalized) (M62.81);Difficulty in walking, not elsewhere classified (R26.2)     Time: 7517-0017 PT Time Calculation (min) (ACUTE ONLY): 21 min  Charges:  $Gait Training: 8-22 mins                     Michelle Nasuti, PTA Acute Rehabilitation Services Secure Chat Preferred  [phone]    Michelle Nasuti 12/14/2022, 5:31 PM

## 2022-12-14 NOTE — Discharge Summary (Signed)
Physician Discharge Summary   [name] XBM:[id number] DOB: 03/29/1931 DOA: 12/11/2022  PCP: Lavone Orn, MD  Admit date: 12/11/2022 Discharge date: 12/14/2022 Barriers to discharge: none  Admitted From: Home Disposition:  Home Discharging physician: Dwyane Dee, MD  Recommendations for Outpatient Follow-up:  Metformin held at discharge to see if helps with diarrhea improvement.  If not, can consider resuming metformin at follow-up Repeat thyroid studies in approximately 4 weeks.  No dose changes made as compliance was a concern due to forgetfulness; patient living with family at discharge so compliance is assumed to be addressed  Home Health: RN, PT, OT Equipment/Devices:   Discharge Condition: stable CODE STATUS: Full Diet recommendation:  Diet Orders (From admission, onward)     Start     Ordered   12/14/22 0000  Diet general        12/14/22 1501   12/12/22 0818  Diet regular Room service appropriate? Yes; Fluid consistency: Thin  Diet effective now       Question Answer Comment  Room service appropriate? Yes   Fluid consistency: Thin      12/12/22 0817            Hospital Course: Ms. Martine is an 89 year old female with PMH permanent A-fib on Eliquis, DM II, HTN, HLD, hypothyroidism, IDA, chronic anxiety/depression who presented after a fall at home. She has been having ongoing intermittent falls associated with worsening weakness.  She does have a caregiver, CNA at home as well.  She has also been having ongoing diarrhea and is on Creon.  She does endorse some mild improvement in her diarrhea after starting Creon recently. She was admitted for electrolyte replacement and PT/OT evaluation.  Assessment and Plan:  Fall, mechanical Imaging revealed no fractures. Supportive care -PT/OT recommending 24/7 care at home   Hypokalemia likely due to GI losses Endorses frequent loose stools, possibly contributed by home metformin. -Repleted   Type 2 diabetes  with hyperglycemia Hold home metformin -A1c 5.9%. Likely could try diet control and see how diarrhea also responds to being off metformin   Loose stools, up to 5/day Afebrile with no leukocytosis Possibly side effect from metformin vs pancreatic insufficiency or combo - continue creon - continue holding metformin at discharge    Hypothyroidism - TSH elevated 43.641 - FT4 normal, 0.82 - possible TSH elevation in setting of acute illness but FT4 slightly LLN; for now I'm concerned about missed doses rather than underdosed and do not want to increase yet - recommended to family to count pills left in bottle and compare to last fill and since she'll have 24/7 care or at least med assistance at discharge, to work on consistent administration for 3-4 weeks then repeat thryoid studies before dose adjusting any - I've explained this to Shirlean Mylar on the phone on 1/31   Mild non anion gap metabolic acidosis - resolved  -Suspect due to GI losses - s/p hydration   Persistent A-fib on Eliquis, rate controlled Resume home Eliquis and lopressor   Iron deficiency anemia Resume home ferrous sulfate Hemoglobin is stable No reported overt bleeding   Hyperlipidemia Resume home Crestor   Chronic anxiety/depression Resume home Zoloft   GERD Resume home lansoprazole  The patient's chronic medical conditions were treated accordingly per the patient's home medication regimen except as noted.  On day of discharge, patient was felt deemed stable for discharge. Patient/family member advised to call PCP or come back to ER if needed.   Principal Diagnosis: Fall  Discharge Diagnoses:  Active Hospital Problems   Diagnosis Date Noted   Fall 12/12/2022    Priority: 1.   Post-surgical hypothyroidism 04/09/2014    Priority: 2.   Physical deconditioning 12/13/2022   Hypokalemia 12/13/2022   Diarrhea 12/13/2022   Persistent atrial fibrillation (HCC)    DMII (diabetes mellitus, type 2) ([facility])    GERD  10/14/2010   Essential hypertension 10/13/2010    Resolved Hospital Problems  No resolved problems to display.     Discharge Instructions     Diet general   Complete by: As directed    Increase activity slowly   Complete by: As directed       Allergies as of 12/14/2022       Reactions   Penicillins Itching, Rash, Other (See Comments)   Did it involve swelling of the face/tongue/throat, SOB, or low BP? No Did it involve sudden or severe rash/hives, skin peeling, or any reaction on the inside of your mouth or nose? No Did you need to seek medical attention at a hospital or doctor's office? No When did it last happen?    30+ years   If all above answers are "NO", may proceed with cephalosporin use.        Medication List     STOP taking these medications    lansoprazole 30 MG capsule Commonly known as: PREVACID   metFORMIN 500 MG 24 hr tablet Commonly known as: GLUCOPHAGE-XR       TAKE these medications    Accu-Chek Guide Me w/Device Kit   Accu-Chek Guide test strip Generic drug: glucose blood   acetaminophen 650 MG CR tablet Commonly known as: TYLENOL Take 650 mg by mouth 3 (three) times daily.   cetirizine 10 MG tablet Commonly known as: ZYRTEC Take 10 mg by mouth daily.   cholecalciferol 25 MCG (1000 UNIT) tablet Commonly known as: VITAMIN D3 Take 1,000 Units by mouth daily.   Eliquis 5 MG Tabs tablet Generic drug: apixaban TAKE 1 TABLET TWICE DAILY What changed: how much to take   ferrous sulfate 325 (65 FE) MG tablet Take 325 mg by mouth daily with breakfast.   fluticasone 50 MCG/ACT nasal spray Commonly known as: FLONASE Place 1 spray into both nostrils at bedtime as needed for allergies.   levothyroxine 125 MCG tablet Commonly known as: SYNTHROID Take 125 mcg by mouth daily.   lipase/protease/amylase 36000 UNITS Cpep capsule Commonly known as: Creon Take 2 capsules (72,000 Units total) by mouth 3 (three) times daily with meals. May  also take 1 capsule (36,000 Units total) as needed (with snacks). What changed: See the new instructions.   LORazepam 1 MG tablet Commonly known as: ATIVAN Take 0.5 mg by mouth at bedtime. Take additional 0.5 mg if needed   metoprolol tartrate 100 MG tablet Commonly known as: LOPRESSOR TAKE 1 TABLET(100 MG) BY MOUTH TWICE DAILY What changed: See the new instructions.   Polyvinyl Alcohol-Povidone PF 1.4-0.6 % Soln Place 1 drop into both eyes daily as needed (dry eyes).   rosuvastatin 10 MG tablet Commonly known as: CRESTOR Take 10 mg by mouth at bedtime.   sertraline 50 MG tablet Commonly known as: ZOLOFT Take 50 mg by mouth daily.   vitamin B-12 500 MCG tablet Commonly known as: CYANOCOBALAMIN Take 500 mcg by mouth daily.   zinc gluconate 50 MG tablet Take 50 mg by mouth daily.        Follow-up Information     [facility] Follow up.   Specialty:  Home Health Services Why: For home health services. they will call in 1-2 days to set up a time to come out to the house Contact information: [address] [place] [address] [phone]                Allergies  Allergen Reactions   Penicillins Itching, Rash and Other (See Comments)    Did it involve swelling of the face/tongue/throat, SOB, or low BP? No Did it involve sudden or severe rash/hives, skin peeling, or any reaction on the inside of your mouth or nose? No Did you need to seek medical attention at a hospital or doctor's office? No When did it last happen?    30+ years   If all above answers are "NO", may proceed with cephalosporin use.     Consultations:   Procedures:   Discharge Exam: BP (!) 146/92   Pulse (!) 58   Temp 98.6 F (37 C) (Oral)   Resp 18   Ht '5\' 6"'$  (1.676 m)   Wt 66.9 kg   LMP  (LMP Unknown)   SpO2 97%   BMI 23.81 kg/m  Physical Exam Constitutional:      General: She is not in acute distress.    Appearance: Normal appearance.  HENT:      Head: Normocephalic and atraumatic.     Mouth/Throat:     Mouth: Mucous membranes are moist.  Eyes:     Extraocular Movements: Extraocular movements intact.  Pulmonary:     Effort: Pulmonary effort is normal.     Breath sounds: Normal breath sounds.  Abdominal:     General: Bowel sounds are normal. There is no distension.     Palpations: Abdomen is soft.     Tenderness: There is no abdominal tenderness.  Musculoskeletal:        General: Normal range of motion.     Cervical back: Normal range of motion and neck supple.  Skin:    General: Skin is warm and dry.  Neurological:     General: No focal deficit present.     Mental Status: She is alert.  Psychiatric:        Mood and Affect: Mood normal.      The results of significant diagnostics from this hospitalization (including imaging, microbiology, ancillary and laboratory) are listed below for reference.   Microbiology: No results found for this or any previous visit (from the past 240 hour(s)).   Labs: BNP (last 3 results) No results for input(s): "BNP" in the last 8760 hours. Basic Metabolic Panel: Recent Labs  Lab 12/11/22 2220 12/12/22 0636 12/13/22 0220 12/13/22 1654 12/14/22 0320  NA 138 141 143 139 141  K 2.5* 2.5* 2.6* 3.1* 3.1*  CL 104 106 107 106 106  CO2 21* '24 25 22 26  '$ GLUCOSE 143* 112* 138* 156* 147*  BUN '22 17 10 10 10  '$ CREATININE 0.85 0.62 0.75 0.73 0.54  CALCIUM 9.0 8.6* 8.6* 8.5* 8.4*  MG  --  1.5* 1.7  --  1.9  PHOS  --  2.4*  --   --   --    Liver Function Tests: No results for input(s): "AST", "ALT", "ALKPHOS", "BILITOT", "PROT", "ALBUMIN" in the last 168 hours. No results for input(s): "LIPASE", "AMYLASE" in the last 168 hours. Recent Labs  Lab 12/13/22 0220  AMMONIA 11   CBC: Recent Labs  Lab 12/11/22 2220 12/12/22 0636 12/13/22 0220 12/14/22 0320  WBC 6.6 6.0 7.3 7.8  NEUTROABS  --   --  5.5 5.9  HGB 11.9* 11.8* 12.8 11.0*  HCT 33.7* 33.5* 36.0 31.6*  MCV 97.7 98.2 97.0 97.2   PLT 197 189 225 188   Cardiac Enzymes: Recent Labs  Lab 12/13/22 0220  CKTOTAL 166   BNP: Invalid input(s): "POCBNP" CBG: Recent Labs  Lab 12/13/22 0854 12/13/22 1143 12/13/22 1559 12/13/22 2117 12/14/22 0932  GLUCAP 145* 145* 172* 127* 135*   D-Dimer No results for input(s): "DDIMER" in the last 72 hours. Hgb A1c Recent Labs    12/12/22 0632  HGBA1C 5.9*   Lipid Profile No results for input(s): "CHOL", "HDL", "LDLCALC", "TRIG", "CHOLHDL", "LDLDIRECT" in the last 72 hours. Thyroid function studies Recent Labs    12/13/22 0220  TSH 43.641*   Anemia work up No results for input(s): "VITAMINB12", "FOLATE", "FERRITIN", "TIBC", "IRON", "RETICCTPCT" in the last 72 hours. Urinalysis    Component Value Date/Time   COLORURINE YELLOW 12/12/2022 0322   APPEARANCEUR CLEAR 12/12/2022 0322   LABSPEC 1.018 12/12/2022 0322   PHURINE 5.0 12/12/2022 0322   GLUCOSEU NEGATIVE 12/12/2022 0322   HGBUR NEGATIVE 12/12/2022 0322   BILIRUBINUR NEGATIVE 12/12/2022 0322   KETONESUR 5 (A) 12/12/2022 0322   PROTEINUR 30 (A) 12/12/2022 0322   NITRITE NEGATIVE 12/12/2022 0322   LEUKOCYTESUR NEGATIVE 12/12/2022 0322   Sepsis Labs Recent Labs  Lab 12/11/22 2220 12/12/22 0636 12/13/22 0220 12/14/22 0320  WBC 6.6 6.0 7.3 7.8   Microbiology No results found for this or any previous visit (from the past 240 hour(s)).  Procedures/Studies: CT Head Wo Contrast  Result Date: 12/12/2022 CLINICAL DATA:  Trauma EXAM: CT HEAD WITHOUT CONTRAST TECHNIQUE: Contiguous axial images were obtained from the base of the skull through the vertex without intravenous contrast. RADIATION DOSE REDUCTION: This exam was performed according to the departmental dose-optimization program which includes automated exposure control, adjustment of the mA and/or kV according to patient size and/or use of iterative reconstruction technique. COMPARISON:  12/05/2021 FINDINGS: Brain: There is no mass, hemorrhage or  extra-axial collection. The size and configuration of the ventricles and extra-axial CSF spaces are normal. There is hypoattenuation of the white matter, most commonly indicating chronic small vessel disease. Vascular: No abnormal hyperdensity of the major intracranial arteries or dural venous sinuses. No intracranial atherosclerosis. Skull: The visualized skull base, calvarium and extracranial soft tissues are normal. Sinuses/Orbits: No fluid levels or advanced mucosal thickening of the visualized paranasal sinuses. No mastoid or middle ear effusion. The orbits are normal. IMPRESSION: No acute intracranial abnormality. Electronically Signed   By: Ulyses Jarred M.D.   On: 12/12/2022 01:00   DG Femur Min 2 Views Left  Result Date: 12/12/2022 CLINICAL DATA:  Fall EXAM: LEFT FEMUR 2 VIEWS COMPARISON:  None Available. FINDINGS: No fracture or dislocation is seen. The joint spaces are preserved. Soft tissues are within normal limits, noting vascular calcifications. IMPRESSION: Negative. Electronically Signed   By: Julian Hy M.D.   On: 12/12/2022 00:56   DG Pelvis 1-2 Views  Result Date: 12/12/2022 CLINICAL DATA:  Fall EXAM: PELVIS - 1-2 VIEW COMPARISON:  None Available. FINDINGS: No fracture or dislocation is seen. Bilateral hip joint spaces are symmetric. Visualized bony pelvis appears intact. Lower lumbar spine fixation hardware, incompletely visualized. IMPRESSION: Negative. Electronically Signed   By: Julian Hy M.D.   On: 12/12/2022 00:56   DG Shoulder Right  Result Date: 12/12/2022 CLINICAL DATA:  Fall EXAM: RIGHT SHOULDER - 2+ VIEW COMPARISON:  None Available. FINDINGS: No fracture or dislocation is seen. Surgical clips in  the right axilla. Visualized right lung is clear. IMPRESSION: Negative. Electronically Signed   By: Julian Hy M.D.   On: 12/12/2022 00:56   DG Knee Complete 4 Views Left  Result Date: 12/12/2022 CLINICAL DATA:  Fall EXAM: LEFT KNEE - COMPLETE 4+ VIEW  COMPARISON:  None Available. FINDINGS: Severe tricompartmental degenerative changes, most prominent in the patellofemoral compartment. Medial and lateral compartment chondrocalcinosis. No fracture or dislocation is seen. The visualized soft tissues are unremarkable. No suprapatellar knee joint effusion. IMPRESSION: No fracture or dislocation is seen. Severe tricompartmental degenerative changes, as above. Electronically Signed   By: Julian Hy M.D.   On: 12/12/2022 00:55   DG Chest 2 View  Result Date: 12/09/2022 CLINICAL DATA:  [id number] Rib contusion, right, initial encounter [id number], fall EXAM: CHEST - 2 VIEW COMPARISON:  08/18/2020 chest radiograph. FINDINGS: Surgical clips overlie the right axilla. Stable cardiomediastinal silhouette with mild cardiomegaly. Stable asymmetric mild nodular prominence of the right hilum, unchanged back to at least 04/23/2018 chest radiograph. No pneumothorax. No pleural effusion. No pulmonary edema. No acute consolidative airspace disease. No displaced fracture in the visualized chest. IMPRESSION: 1. No pneumothorax. No displaced fracture in the visualized chest. Should the patient's symptoms persist or worsen, dedicated radiographs of the right ribs may be considered. 2. Stable mild cardiomegaly without pulmonary edema. 3. Chronic asymmetric mild nodular prominence of the right hilum, unchanged on radiographs back to at least 2019, favoring a benign etiology. Electronically Signed   By: Ilona Sorrel M.D.   On: 12/09/2022 16:06     Time coordinating discharge: Over 30 minutes    Dwyane Dee, MD  [facility] 12/14/2022, 5:30 PM

## 2022-12-14 NOTE — Progress Notes (Signed)
Mobility Specialist Progress Note   12/14/22 1500  Mobility  Activity Contraindicated/medical hold   Pt inappropriate for mobility specialist at this time given level of complexity, physical assist, and/or precautions as advised by RN (pt's BP elevated, RN requesting to bring BP down before working with). Mobility specialist to hold today, will continue to follow for readiness.   Holland Falling Mobility Specialist Please contact via SecureChat or  Rehab office at [phone]

## 2022-12-14 NOTE — Progress Notes (Signed)
OT Cancellation Note  Patient Details Name: [name] MRN: [id number] DOB: 1931-04-25   Cancelled Treatment:    Reason Eval/Treat Not Completed: Medical issues which prohibited therapy (pt with BP of 155/108)  Malka So 12/14/2022, 3:36 PM Cleta Alberts, OTR/L Acute Rehabilitation Services Office: [phone]

## 2022-12-18 ENCOUNTER — Other Ambulatory Visit (HOSPITAL_COMMUNITY): Payer: Self-pay | Admitting: *Deleted

## 2022-12-18 MED ORDER — APIXABAN 5 MG PO TABS: 5.0000 mg | ORAL_TABLET | Freq: Two times a day (BID) | ORAL | 1 refills | Status: DC

## 2022-12-21 ENCOUNTER — Encounter (HOSPITAL_COMMUNITY): Payer: Self-pay | Admitting: *Deleted

## 2022-12-26 ENCOUNTER — Telehealth: Payer: Self-pay | Admitting: Physician Assistant

## 2022-12-26 DIAGNOSIS — K8689 Other specified diseases of pancreas: Secondary | ICD-10-CM

## 2022-12-26 DIAGNOSIS — A09 Infectious gastroenteritis and colitis, unspecified: Secondary | ICD-10-CM

## 2022-12-26 NOTE — Telephone Encounter (Signed)
PT daughter is calling with concerns about the Creon. It is not helping. PT is having explosive diarrhea, dehydration and no appetite. She has given Immodium and still no relief. Please advise.

## 2022-12-27 MED ORDER — LOPERAMIDE HCL 2 MG PO CAPS: 2.0000 mg | ORAL_CAPSULE | ORAL | 0 refills | Status: DC

## 2022-12-27 NOTE — Telephone Encounter (Signed)
Called and spoke with patient's daughter [name] regarding recommendations. I informed her that it would be best if patient could come for labs and stool studies prior to her appt on Friday. [name] states that she had to bring her mother to [place] with her and that she is in a wheelchair. [name] would prefer to have labs drawn when they come in for appt on Friday and stated that they will try to get the stool studies submitted next week. I expressed to [name] the importance of coming in as soon as they can due to patient's age and ongoing issues that way we could rule out any infectious causes of her diarrhea. [name] was thankful for the recommendations but still wishes to have labs at the time of OV. [name] verbalized understanding and had no concerns at the end of the call.

## 2022-12-27 NOTE — Telephone Encounter (Signed)
Called and spoke with patient's daughter. She states that patient is just passing liquid, no stools. Pt was recently in the hospital for dehydration and had to get fluids. They did not do any stool tests. They did discontinue Metformin with hopes of improving her stools but the daughter reports that her stools are worse. She states that the patient is not really eating solid foods. Her best meal is the first thing in the morning, today she ate strawberry greek yogurt with blueberries. Pt's daughter states that she is trying to give her plenty of Pedialyte and fluids. Daughter is scared that patient will end up back in the hospital. Daughter reports that patient is passing about 4 liquid stools a day. She gave patient 1 Imodium yesterday afternoon and patient still had explosive diarrhea throughout the evening. She gave her 2 Imodium the day prior with no improvement. Daughter also started giving patient fiber gummies. I mentioned that patient might need stool studies but the daughter states that there is no stool, just liquid. I moved her appt up to Friday, 2/16//24 at 1:30 pm. Please advise, thanks.

## 2022-12-29 ENCOUNTER — Ambulatory Visit: Payer: Medicare PPO | Admitting: Physician Assistant

## 2022-12-29 ENCOUNTER — Other Ambulatory Visit (INDEPENDENT_AMBULATORY_CARE_PROVIDER_SITE_OTHER): Payer: Medicare PPO

## 2022-12-29 ENCOUNTER — Encounter: Payer: Self-pay | Admitting: Physician Assistant

## 2022-12-29 VITALS — BP 90/60 | HR 96 | Ht 65.5 in

## 2022-12-29 DIAGNOSIS — K8689 Other specified diseases of pancreas: Secondary | ICD-10-CM

## 2022-12-29 DIAGNOSIS — R197 Diarrhea, unspecified: Secondary | ICD-10-CM

## 2022-12-29 DIAGNOSIS — A09 Infectious gastroenteritis and colitis, unspecified: Secondary | ICD-10-CM | POA: Diagnosis not present

## 2022-12-29 LAB — COMPREHENSIVE METABOLIC PANEL
ALT: 13 U/L (ref 0–35)
AST: 15 U/L (ref 0–37)
Albumin: 3.4 g/dL — ABNORMAL LOW (ref 3.5–5.2)
Alkaline Phosphatase: 60 U/L (ref 39–117)
BUN: 19 mg/dL (ref 6–23)
CO2: 29 mEq/L (ref 19–32)
Calcium: 9 mg/dL (ref 8.4–10.5)
Chloride: 100 mEq/L (ref 96–112)
Creatinine, Ser: 0.64 mg/dL (ref 0.40–1.20)
GFR: 78.15 mL/min (ref >60.00)
Glucose, Bld: 157 mg/dL — ABNORMAL HIGH (ref 70–99)
Potassium: 3.5 mEq/L (ref 3.5–5.1)
Sodium: 138 mEq/L (ref 135–145)
Total Bilirubin: 0.5 mg/dL (ref 0.2–1.2)
Total Protein: 6.6 g/dL (ref 6.0–8.3)

## 2022-12-29 LAB — CBC WITH DIFFERENTIAL/PLATELET
Basophils Absolute: 0.1 10*3/uL (ref 0.0–0.1)
Basophils Relative: 1.5 % (ref 0.0–3.0)
Eosinophils Absolute: 0.3 10*3/uL (ref 0.0–0.7)
Eosinophils Relative: 5.1 % — ABNORMAL HIGH (ref 0.0–5.0)
HCT: 33.5 % — ABNORMAL LOW (ref 36.0–46.0)
Hemoglobin: 11.3 g/dL — ABNORMAL LOW (ref 12.0–15.0)
Lymphocytes Relative: 13.7 % (ref 12.0–46.0)
Lymphs Abs: 0.8 10*3/uL (ref 0.7–4.0)
MCHC: 33.8 g/dL (ref 30.0–36.0)
MCV: 100.8 fl — ABNORMAL HIGH (ref 78.0–100.0)
Monocytes Absolute: 0.6 10*3/uL (ref 0.1–1.0)
Monocytes Relative: 10.2 % (ref 3.0–12.0)
Neutro Abs: 4.2 10*3/uL (ref 1.4–7.7)
Neutrophils Relative %: 69.5 % (ref 43.0–77.0)
Platelets: 284 10*3/uL (ref 150.0–400.0)
RBC: 3.32 Mil/uL — ABNORMAL LOW (ref 3.87–5.11)
RDW: 14.1 % (ref 11.5–15.5)
WBC: 6.1 10*3/uL (ref 4.0–10.5)

## 2022-12-29 MED ORDER — PANCRELIPASE (LIP-PROT-AMYL) 36000-114000 UNITS PO CPEP: ORAL_CAPSULE | ORAL | 11 refills | Status: DC

## 2022-12-29 NOTE — Patient Instructions (Addendum)
Your provider has requested that you go to the basement level for lab work before leaving today. Press "B" on the elevator. The lab is located at the first door on the left as you exit the elevator.   We have sent the following medications to your pharmacy for you to pick up at your convenience: Creon  Follow up as needed  If your blood pressure at your visit was 140/90 or greater, please contact your primary care physician to follow up on this.  _______________________________________________________  If you are age 87 or older, your body mass index should be between 23-30. Your Body mass index is 24.17 kg/m. If this is out of the aforementioned range listed, please consider follow up with your Primary Care Provider.  If you are age 27 or younger, your body mass index should be between 19-25. Your Body mass index is 24.17 kg/m. If this is out of the aformentioned range listed, please consider follow up with your Primary Care Provider.   ________________________________________________________  The [facility] providers would like to encourage you to use [facility] to communicate with providers for non-urgent requests or questions.  Due to long hold times on the telephone, sending your provider a message by [facility] may be a faster and more efficient way to get a response.  Please allow 48 business hours for a response.  Please remember that this is for non-urgent requests.   Due to recent changes in healthcare laws, you may see the results of your imaging and laboratory studies on MyChart before your provider has had a chance to review them.  We understand that in some cases there may be results that are confusing or concerning to you. Not all laboratory results come back in the same time frame and the provider may be waiting for multiple results in order to interpret others.  Please give [place] 48 hours in order for your provider to thoroughly review all the results before contacting the office for  clarification of your results.   Thank you for entrusting me with your care and choosing [facility].  Ellouise Newer PA-C

## 2022-12-29 NOTE — Progress Notes (Addendum)
Chief Complaint: Follow-up diarrhea  HPI:    [name] is an 89 year old female, known to Dr. Carlean Purl, with a past medical history as listed below including pancreatic insufficiency, who presents to clinic today for follow-up of her diarrhea.     03/04/2008 colonoscopy done for history of adenomatous polyps with diverticulosis and no polyps at that time.    06/09/2015 EGD with E3 columnar tongue seen at the GE junction, hiatal hernia and moderate nonerosive gastritis.  Pathology showed Barrett's with no dysplasia and no recall was placed due to age.    05/25/2017 surgical hiatal hernia repair and Nissen fundoplication.    03/15/2022 patient seen in clinic and described that for 4 to 5 months she had nothing but loose stools multiple times a day that were urgent and had a lot of accidents.  Also reported documented weight loss of around 20 pounds in the past 6 months.  At that time ordered a CT of the abdomen pelvis for further evaluation of weight loss.  Ordered stool studies and labs.  Also she was encouraged to try over-the-counter Imodium.    04/11/2022 CMP, celiac testing and CBC normal.  Pancreatic elastase did return with moderate pancreatic insufficiency and fecal lactoferrin was positive.  C. difficile and GI profile panel negative.    03/25/2022 CT the abdomen pelvis showed status post gastric fundoplication with corresponding improvement in a large hiatal hernia and small residual sliding-type hiatal hernia with bowel wall thickening, nonspecific and could reflect esophagitis.  She was started on Prevacid 30 mg twice a day to see if it helped with her symptoms given signs of esophagitis.  Stool studies returned with a decreased fecal pancreatic elastase and patient was started on Creon 36,000 units 2 with a meal monitor snack.    04/20/2022 patient followed in clinic and was doing much better on the Creon supplementation.  At that time continued on 36,000 lipase units 2 with a meal and 1 with a  snack.    12/26/2022 patient's daughter called and described liquid stools.  Her metformin was on hold but symptoms were worse.  Described 4 liquid stools a day and explosive diarrhea.  Imodium with little help.  That time recommended stool studies including GI path panel, O&P, fecal lactoferrin, CBC and CMP.  Recommend she increase her Creon to 3 with a meal and 2 with a snack.  Also discussed scheduling Imodium 1 tab every 4 hours.    12/11/2022-12/14/2022 patient admitted to the hospital for a fall.  At that time was having ongoing diarrhea despite Creon.  They held her Metformin as her A1c was normal.    12/14/2022 CBC with a hemoglobin of 11 otherwise normal.  BMP with a potassium of 3.1.    Today, the patient presents to clinic accompanied by her caretaker and her daughter.  They explained that she is having blow out urgent liquid stools 4-5 times a day.  This has been going on since she was recently in the hospital as above.  Just recently they tried to start Imodium for her which does seem to help a little bit, they have not gone a full day where this is scheduled at 1 tab every 4 hours as I requested yet.  She is still taking Creon 2 with a meal and 1 with a snack at the moment.  No abdominal pain.  Associated symptoms include a decrease in appetite.    Denies fever, chills, weight loss, blood in her stool, nausea or vomiting.  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   Allergy    SEASONAL   Anemia    years ago   Anticoagulated by anticoagulation treatment - Eliquis 03/30/2014   Started May 2015    Anxiety state 10/13/2010   Qualifier: Diagnosis of  By: Nils Pyle CMA (AAMA), Leisha     Arthritis    "all my joints" (03/31/2014)   Barrett's esophagus    Breast cancer ([place])    s/p right breast lumpectomy   CARDIAC MURMUR  10/13/2010   Qualifier: Diagnosis of  By: Nils Pyle CMA (AAMA), Leisha     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   Cervical cancer ([place])    Chronic cough 01/02/2017   GERD, hiatal hernia [place] am[place]   COLONIC POLYPS, ADENOMATOUS, HX OF 10/13/2010   Qualifier: Diagnosis of  By: Nils Pyle CMA (AAMA), Leisha     Degenerative disorder of eye 07/25/2012   Diabetes mellitus without complication ([place])    Dilated cardiomyopathy ([place]) 08/27/2018   DIVERTICULOSIS, COLON 10/13/2010   Qualifier: Diagnosis of  By: Nils Pyle CMA (AAMA), Leisha     DJD (degenerative joint disease) of knee    Dysrhythmia    Afib   Echocardiogram 07/2020    Echo 9/21: EF 70-75, moderate LVH, GR 1 DD, RVSP 40.5 (mildly elevated), normal RVSF, moderate LAE, mild RAE, mild MR, trivial AI   EROSIVE ESOPHAGITIS 10/13/2010   Qualifier: Diagnosis of  By: Nils Pyle CMA (AAMA), Leisha     Error, refractive, myopia 07/09/2012   Essential hypertension 10/13/2010   Qualifier: Diagnosis of  By: Nils Pyle CMA (AAMA), Rueben Bash, intermittent 07/09/2012   GERD 10/14/2010   Qualifier: Diagnosis of  By: Sharlett Iles MD Cline Cools R    GERD (gastroesophageal reflux disease)    Glaucoma    H/O hiatal hernia    Heart murmur    Hiatal hernia s/p robotic repair & fundoplication [id number]   Qualifier: Diagnosis of  By: Nils Pyle CMA (AAMA), Mearl Latin     History of laser assisted in situ keratomileusis 07/25/2012   Hx of transesophageal echocardiography (TEE) for monitoring    TEE (03/2014): No LAA clot, moderate LAE, core triatriatum type structure in RA with no stenosis, normal EF 55-60%, mild MR   HYPERCHOLESTEROLEMIA 10/13/2010   Qualifier: Diagnosis of  By: Nils Pyle CMA (AAMA), Leisha     Hypertension    Hypothyroidism, postsurgical    Iron Deficiency Anemia 10/14/2010   Qualifier: Diagnosis of  By: Sharlett Iles MD Cline Cools R    Irritable bowel syndrome 10/13/2010   Qualifier: Diagnosis of  By: Nils Pyle CMA (AAMA), Leisha     Lumbar stenosis with  neurogenic claudication 04/20/2016   Malignant neoplasm of breast ([place]) 02/25/2016   Migraine    "last one was years ago" (03/31/2014)   MIGRAINE HEADACHE 10/13/2010   Qualifier: Diagnosis of  By: Nils Pyle CMA (AAMA), Leisha     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   Overview:  X(T)    OSA on CPAP    settings at 10-11    OSTEOARTHRITIS 10/13/2010   Qualifier: Diagnosis of  By: Nils Pyle CMA (AAMA), Leisha     Persistent atrial fibrillation 03/31/2015   DLCO dropped from 100% in 2015-50% in 01/2017   Post-surgical hypothyroidism 04/09/2014   RECTAL FISSURE 10/13/2010   Qualifier: Diagnosis of  By: Nils Pyle CMA (AAMA),  Leisha     S/P Nissen fundoplication (without gastrostomy tube) procedure 05/25/2017   Sleep apnea    THYROID CANCER 10/13/2010   Qualifier: Diagnosis of  By: Nils Pyle CMA Deborra Medina), Mearl Latin     Thyroid cancer [facility])    s/p thyroidectomy   Type II diabetes mellitus ([place])    type II     Past Surgical History:  Procedure Laterality Date   ABDOMINAL HYSTERECTOMY     "partial"   BREAST BIOPSY Right    BREAST LUMPECTOMY Right    CARDIOVERSION N/A 03/30/2014   Procedure: CARDIOVERSION - BEDSIDE;  Surgeon: [name], MD;  Location: [facility];  Service: Cardiovascular;  Laterality: N/A;   CARDIOVERSION N/A 03/31/2014   Procedure: CARDIOVERSION  (BEDSIDE) ;  Surgeon: Lelon Perla, MD;  Location: [place];  Service: Cardiovascular;  Laterality: N/A;   CARDIOVERSION N/A 04/27/2014   Procedure: CARDIOVERSION;  Surgeon: Darlin Coco, MD;  Location: [place];  Service: Cardiovascular;  Laterality: N/A;   CARDIOVERSION N/A 04/13/2015   Procedure: CARDIOVERSION;  Surgeon: Thayer Headings, MD;  Location: [place];  Service: Cardiovascular;  Laterality: N/A;   CARDIOVERSION N/A 01/08/2017   Procedure: CARDIOVERSION;  Surgeon: Sanda Klein, MD;  Location[facility] ENDOSCOPY;  Service: Cardiovascular;  Laterality: N/A;   CARDIOVERSION N/A 09/19/2017   Procedure: CARDIOVERSION;  Surgeon: [name], MD;   Location: [place];  Service: Cardiovascular;  Laterality: N/A;   CARDIOVERSION N/A 10/25/2017   Procedure: CARDIOVERSION;  Surgeon: Nahser, Wonda Cheng, MD;  Location: [place];  Service: Cardiovascular;  Laterality: N/A;   CARDIOVERSION N/A 12/27/2018   Procedure: CARDIOVERSION;  Surgeon: [name], MD;  Location: [place];  Service: Cardiovascular;  Laterality: N/A;   CARDIOVERSION N/A 05/20/2019   Procedure: CARDIOVERSION;  Surgeon: [name], MD;  Location: [place];  Service: Cardiovascular;  Laterality: N/A;   CARDIOVERSION N/A 12/10/2019   Procedure: CARDIOVERSION;  Surgeon: Pixie Casino, MD;  Location: [place];  Service: Cardiovascular;  Laterality: N/A;   CARDIOVERSION N/A 12/31/2019   Procedure: CARDIOVERSION;  Surgeon: Sanda Klein, MD;  Location: [place];  Service: Cardiovascular;  Laterality: N/A;   CARDIOVERSION N/A 10/21/2020   Procedure: CARDIOVERSION;  Surgeon: [name], MD;  Location: [place];  Service: Cardiovascular;  Laterality: N/A;   CARDIOVERSION N/A 12/03/2020   Procedure: CARDIOVERSION;  Surgeon: Jerline Pain, MD;  Location: [place];  Service: Cardiovascular;  Laterality: N/A;   CARDIOVERSION N/A 07/11/2021   Procedure: CARDIOVERSION;  Surgeon: Pixie Casino, MD;  Location: [facility] ENDOSCOPY;  Service: Cardiovascular;  Laterality: N/A;   CARPAL TUNNEL RELEASE Right    CATARACT EXTRACTION W/ INTRAOCULAR LENS  IMPLANT, BILATERAL Bilateral    COLONOSCOPY     EXCISIONAL HEMORRHOIDECTOMY     INSERTION OF MESH N/A 05/25/2017   Procedure: INSERTION OF MESH;  Surgeon: Ralene Ok, MD;  Location: [facility];  Service: General;  Laterality: N/A;   TEE WITHOUT CARDIOVERSION N/A 03/30/2014   Procedure: TRANSESOPHAGEAL ECHOCARDIOGRAM (TEE);  Surgeon: [name], MD;  Location: [facility] END[facility]COPY;  Service: Cardiovascular;  Laterality: N/A;   THYROIDECTOMY     TONSILLECTOMY      Current Outpatient Medications   Medication Sig Dispense Refill   ACCU-CHEK GUIDE test strip      acetaminophen (TYLENOL) 650 MG CR tablet Take 650 mg by mouth 3 (three) times daily.     apixaban (ELIQUIS) 5 MG TABS tablet Take 1 tablet (5 mg total) by mouth 2 (two) times daily.  60 tablet 1   Blood Glucose Monitoring Suppl (ACCU-CHEK GUIDE ME) w/Device KIT      cetirizine (ZYRTEC) 10 MG tablet Take 10 mg by mouth daily.     cholecalciferol (VITAMIN D) 25 MCG (1000 UNIT) tablet Take 1,000 Units by mouth daily.     ferrous sulfate 325 (65 FE) MG tablet Take 325 mg by mouth daily with breakfast.     fluticasone (FLONASE) 50 MCG/ACT nasal spray Place 1 spray into both nostrils at bedtime as needed for allergies.      levothyroxine (SYNTHROID) 125 MCG tablet Take 125 mcg by mouth daily.     lipase/protease/amylase (CREON) 36000 UNITS CPEP capsule Take 2 capsules (72,000 Units total) by mouth 3 (three) times daily with meals. May also take 1 capsule (36,000 Units total) as needed (with snacks). (Patient taking differently: Take 3 capsules (108,000 units total) by mouth 3 (three) times daily with meals. May also take 1 capsule (36,000 Units total) as needed (with snacks).) 240 capsule 0   loperamide (IMODIUM) 2 MG capsule Take 1 capsule (2 mg total) by mouth every 4 (four) hours. 60 capsule 0   LORazepam (ATIVAN) 1 MG tablet Take 0.5 mg by mouth at bedtime. Take additional 0.5 mg if needed     metoprolol tartrate (LOPRESSOR) 100 MG tablet TAKE 1 TABLET(100 MG) BY MOUTH TWICE DAILY (Patient taking differently: Take 100 mg by mouth 2 (two) times daily.) 180 tablet 3   Polyvinyl Alcohol-Povidone PF 1.4-0.6 % SOLN Place 1 drop into both eyes daily as needed (dry eyes).      rosuvastatin (CRESTOR) 10 MG tablet Take 10 mg by mouth at bedtime.     sertraline (ZOLOFT) 50 MG tablet Take 50 mg by mouth daily.     vitamin B-12 (CYANOCOBALAMIN) 500 MCG tablet Take 500 mcg by mouth daily.     zinc gluconate 50 MG tablet Take 50 mg by mouth daily.      No current facility-administered medications for this visit.    Allergies as of 12/29/2022 - Review Complete 12/29/2022  Allergen Reaction Noted   Penicillins Itching, Rash, and Other (See Comments)     Family History  Problem Relation Age of Onset   Prostate cancer Father    Diabetes Sister    Heart disease Sister    Heart attack Brother    Heart disease Brother    Diabetes Brother    Diabetes Sister    Diabetes Sister    Pancreatic cancer Sister    Diabetes Brother    Heart disease Brother    Diabetes Brother    Heart disease Brother    Diabetes Brother    Heart disease Brother    Diabetes Brother    Heart disease Brother    Diabetes Brother    Heart disease Brother    Diabetes Brother    Diabetes Child     Social History   Socioeconomic History   Marital status: Divorced    Spouse name: Not on file   Number of children: 2   Years of education: Not on file   Highest education level: Not on file  Occupational History   Occupation: Retired    Comment: Education officer, museum  Tobacco Use   Smoking status: Never   Smokeless tobacco: Never  Vaping Use   Vaping Use: Never used  Substance and Sexual Activity   Alcohol use: No    Alcohol/week: 0.0 standard drinks of alcohol   Drug use: No   Sexual activity:  Never  Other Topics Concern   Not on file  Social History Narrative   Not on file   Social Determinants of Health   Financial Resource Strain: Not on file  Food Insecurity: Not on file  Transportation Needs: Not on file  Physical Activity: Not on file  Stress: Not on file  Social Connections: Not on file  Intimate Partner Violence: Not on file    Review of Systems:    Constitutional: No weight loss, fever or chills Cardiovascular: No chest pain Respiratory: No SOB Gastrointestinal: See HPI and otherwise negative   Physical Exam:  Vital signs: BP 90/60 (BP Location: Left Arm, Patient Position: Sitting, Cuff Size: Normal)   Pulse 96 Comment:  irregular  Ht 5' 5.5" (1.664 m)   LMP  (LMP Unknown)   BMI 24.17 kg/m    Constitutional:   Pleasant Elderly Caucasian female appears to be in NAD, Well developed, Well nourished, alert and cooperative Respiratory: Respirations even and unlabored. Lungs clear to auscultation bilaterally.   No wheezes, crackles, or rhonchi.  Cardiovascular: Normal S1, S2. No MRG. Regular rate and rhythm. No peripheral edema, cyanosis or pallor.  Gastrointestinal:  Soft, nondistended, nontender. No rebound or guarding. Normal bowel sounds. No appreciable masses or hepatomegaly. Rectal:  Not performed.  Psychiatric: Oriented to person, place and time. Demonstrates good judgement and reason without abnormal affect or behaviors.  RELEVANT LABS AND IMAGING: CBC    Component Value Date/Time   WBC 7.8 12/14/2022 0320   RBC 3.25 (L) 12/14/2022 0320   HGB 11.0 (L) 12/14/2022 0320   HGB 13.3 01/10/2021 1133   HGB 12.6 04/14/2009 1107   HCT 31.6 (L) 12/14/2022 0320   HCT 39.2 01/10/2021 1133   HCT 36.9 04/14/2009 1107   PLT 188 12/14/2022 0320   PLT 193 01/10/2021 1133   MCV 97.2 12/14/2022 0320   MCV 93 01/10/2021 1133   MCV 89.5 04/14/2009 1107   MCH 33.8 12/14/2022 0320   MCHC 34.8 12/14/2022 0320   RDW 13.6 12/14/2022 0320   RDW 13.2 01/10/2021 1133   RDW 13.4 04/14/2009 1107   LYMPHSABS 0.9 12/14/2022 0320   LYMPHSABS 1.3 01/10/2021 1133   LYMPHSABS 1.0 04/14/2009 1107   MONOABS 0.8 12/14/2022 0320   MONOABS 0.4 04/14/2009 1107   EOSABS 0.3 12/14/2022 0320   EOSABS 0.3 01/10/2021 1133   BASOSABS 0.1 12/14/2022 0320   BASOSABS 0.0 01/10/2021 1133   BASOSABS 0.0 04/14/2009 1107    CMP     Component Value Date/Time   NA 141 12/14/2022 0320   NA 141 01/10/2021 1133   K 3.1 (L) 12/14/2022 0320   CL 106 12/14/2022 0320   CO2 26 12/14/2022 0320   GLUCOSE 147 (H) 12/14/2022 0320   BUN 10 12/14/2022 0320   BUN 17 01/10/2021 1133   CREATININE 0.54 12/14/2022 0320   CREATININE 0.98 (H)  11/01/2016 1043   CALCIUM 8.4 (L) 12/14/2022 0320   PROT 6.3 03/15/2022 1014   PROT 6.2 04/22/2018 1521   ALBUMIN 3.9 03/15/2022 1014   ALBUMIN 4.1 04/22/2018 1521   AST 11 03/15/2022 1014   ALT 8 03/15/2022 1014   ALKPHOS 51 03/15/2022 1014   BILITOT 0.6 03/15/2022 1014   BILITOT 0.2 04/22/2018 1521   GFRNONAA >60 12/14/2022 0320   GFRAA 90 12/23/2020 1328    Assessment: 1.  Acute on chronic diarrhea: Previously controlled on Creon 2 with a meal and 1 with a snack, diarrhea worsened acutely over the past 1 to 2  months with 5 liquid blowouts a day; consider noninfectious cause versus inflammatory versus other 2.  History of pancreatic insufficiency  Plan: 1.  Ordered GI pathogen panel, fecal leukocyte, fecal lactoferrin, fecal calprotectin and O&P 2.  Also ordered CBC and CMP 3.  Encouraged him to continue Imodium 1 tab every 4 hours. 4.  Increased Creon to three 36,000 unit tabs with a meal and 2 with a snack 5.  Patient to follow in clinic with [place] per recommendations after above.  If Imodium unhelpful could trial Lomotil.  Ellouise Newer, PA-C [facility] 12/29/2022, 1:40 PM  Cc: Corliss Blacker, MD   GI Clinic MD  So far she has a + lactoferrin again - other stool studies negative but calprotectin is pending  I am suspicious that low fecal elastase was dilutional as pancreatic insufficiency is not usually a waetery stool. She may have a form of IBD - microscopic vs UC/Crohn's.   Suggest trial of budesonide or prednisone, especially if calprotectin abnormal but would strongly consider that now.  Gatha Mayer, MD, [facility]

## 2023-01-01 ENCOUNTER — Other Ambulatory Visit: Payer: Medicare PPO

## 2023-01-01 DIAGNOSIS — K8689 Other specified diseases of pancreas: Secondary | ICD-10-CM

## 2023-01-01 DIAGNOSIS — R197 Diarrhea, unspecified: Secondary | ICD-10-CM

## 2023-01-01 DIAGNOSIS — A09 Infectious gastroenteritis and colitis, unspecified: Secondary | ICD-10-CM

## 2023-01-02 LAB — OVA AND PARASITE EXAMINATION
CONCENTRATE RESULT:: NONE SEEN
MICRO NUMBER:: 14583167
SPECIMEN QUALITY:: ADEQUATE
TRICHROME RESULT:: NONE SEEN

## 2023-01-02 LAB — FECAL LACTOFERRIN, QUANT

## 2023-01-03 ENCOUNTER — Other Ambulatory Visit: Payer: Self-pay | Admitting: Physician Assistant

## 2023-01-03 ENCOUNTER — Telehealth: Payer: Self-pay | Admitting: Physician Assistant

## 2023-01-03 LAB — GI PROFILE, STOOL, PCR

## 2023-01-03 NOTE — Telephone Encounter (Signed)
Returned call to [place], [employer]. [name] put me on speaker phone with patient. I informed them that we relayed results to patient's daughter the other day and labs looked good. I informed them that patient should be taking 1 Imodium every 4 hours and 3 Creon capsules with meals and 2 capsule with each snack. [name], CMA stated that she was not aware of that, but the other girls probably know. They have been advised that we are waiting on 1 additional stool test to result and then we will be in touch regarding recommendations. [name] verbalized understanding and had no concerns at the end of the call.

## 2023-01-03 NOTE — Telephone Encounter (Signed)
Patient and her CNS called to inquire about her lab results.

## 2023-01-08 ENCOUNTER — Telehealth: Payer: Self-pay | Admitting: Physician Assistant

## 2023-01-08 LAB — FECAL LACTOFERRIN, QUANT
Fecal Lactoferrin: POSITIVE — AB
MICRO NUMBER:: 14583265
SPECIMEN QUALITY:: ADEQUATE

## 2023-01-08 LAB — CALPROTECTIN: Calprotectin: 587 mcg/g — ABNORMAL HIGH

## 2023-01-08 MED ORDER — BUDESONIDE 3 MG PO CPEP: ORAL_CAPSULE | ORAL | 0 refills | Status: DC

## 2023-01-08 NOTE — Telephone Encounter (Signed)
PT gave permission to speak to Lorin Glass. PT is needing a RX for nausea. Please advise.

## 2023-01-08 NOTE — Telephone Encounter (Signed)
Called [facility], I was informed that they did not run a fecal calprotectin and it is too late to add on to the other specimen. Not sure why the test says "In process" but this test was never completed.

## 2023-01-08 NOTE — Telephone Encounter (Signed)
Returned call to Lorin Glass, [place]. [name] is gone for today and I spoke with Alferd Patee, [place]. I began to provide Jennifer's recommendations, I was told to call patient's daughter Shirlean Mylar with information. Attempted to reach Robin twice, her vm is full and not accepting any new messages at this time.   I called back and spoke with Janel, CMA and asked her to have Robin reach out to [place]. Janel stated that she would write down the recommendations and provide it to [place]. I informed her that stool studies were negative for infection/bacteria, but there is some inflammation in patient's colon. Janel verbalized understanding of all information and had no concerns at the end of the call.

## 2023-01-08 NOTE — Telephone Encounter (Signed)
Was reviewing note today - please see my addendum - may have microscopic colitis problem

## 2023-01-08 NOTE — Telephone Encounter (Signed)
PT daughter is calling to find out the lab results from the culture taken. She also wants to know can they have a RX for Immodium. OTC is not working as well. Please advise.

## 2023-01-09 ENCOUNTER — Other Ambulatory Visit (HOSPITAL_COMMUNITY): Payer: Self-pay

## 2023-01-09 MED ORDER — PREDNISONE 10 MG PO TABS: ORAL_TABLET | ORAL | 0 refills | Status: DC

## 2023-01-09 NOTE — Telephone Encounter (Addendum)
Patient's daughter is calling wishing to speak with [name] or her nurse, she thinks patient may need to be put on a different medication states the diarrhea is getting worse. Please advise

## 2023-01-09 NOTE — Addendum Note (Signed)
Addended by: Gatha Mayer on: 01/09/2023 12:58 PM   Modules accepted: Orders

## 2023-01-09 NOTE — Telephone Encounter (Addendum)
Returned call to patient's daughter Shirlean Mylar. I informed her that I tried to reach her yesterday to relay recommendations. I advised that we sent RX for Budesonide. Shirlean Mylar states that the medication was over $3K and needed a PA. I informed her that we will send message to PA team to work on authorization and see how much the medication will cost. I informed Shirlean Mylar that if the medication is still cost prohibitive an alternative medication may need to be prescribed (Prednisone). Shirlean Mylar states that the diarrhea has become worse after increasing the Creon to 3 capsule with meals and 2 capsules with snacks. Shirlean Mylar wonders if patient is having "an allergic reaction to Creon" and wanted to know if there was an alternative. I told her that Zenpep is another form of pancreatic enzyme replacement. Shirlean Mylar is aware that there is no bacteria or infection causing the diarrhea. Shirlean Mylar is concerned. Please advise, thanks.  A secure chat has been sent to [place] to initiate PA for Budesonide RX.

## 2023-01-09 NOTE — Telephone Encounter (Signed)
Called and spoke with Shirlean Mylar regarding recommendations for alternative medication. Shirlean Mylar is aware that Prednisone RX has been sent to [place] and patient should get started on it as soon as possible. Dr. Carlean Purl had a cancellation tomorrow at 1:30 pm and Shirlean Mylar will bring patient at that time. Robin verbalized understanding and had no concerns at the end of the call.

## 2023-01-09 NOTE — Telephone Encounter (Signed)
I have dced budesonide and rxed prednisone instead - I pended it so we can confirm correct pharmacy  Can she see me tomorrow or Friday at 350? (my add on spot)

## 2023-01-09 NOTE — Addendum Note (Signed)
Addended by: Yevette Edwards on: 01/09/2023 02:38 PM   Modules accepted: Orders

## 2023-01-09 NOTE — Telephone Encounter (Signed)
Received a response from [place] - "(Drug Misuse / USE IS NOT MEDICALLY ACCEPTED / DRUG NOT SUPPORTED IN K52.839, R19.7, and K86.89). It seems that her insurance for this medication will cover it with a Crohn's "

## 2023-01-10 ENCOUNTER — Inpatient Hospital Stay (HOSPITAL_COMMUNITY)
Admission: EM | Admit: 2023-01-10 | Discharge: 2023-01-16 | DRG: 391 | Disposition: A | Discharge status: Home Health | Payer: Medicare PPO | Attending: Family Medicine | Admitting: Family Medicine

## 2023-01-10 ENCOUNTER — Observation Stay (HOSPITAL_COMMUNITY): Payer: Medicare PPO

## 2023-01-10 ENCOUNTER — Ambulatory Visit: Payer: Medicare PPO | Admitting: Internal Medicine

## 2023-01-10 ENCOUNTER — Encounter (HOSPITAL_COMMUNITY): Payer: Self-pay

## 2023-01-10 ENCOUNTER — Encounter: Payer: Self-pay | Admitting: Internal Medicine

## 2023-01-10 ENCOUNTER — Other Ambulatory Visit: Payer: Self-pay

## 2023-01-10 VITALS — BP 110/68 | HR 110 | Ht 65.5 in

## 2023-01-10 DIAGNOSIS — I420 Dilated cardiomyopathy: Secondary | ICD-10-CM | POA: Diagnosis present

## 2023-01-10 DIAGNOSIS — K2100 Gastro-esophageal reflux disease with esophagitis, without bleeding: Secondary | ICD-10-CM | POA: Diagnosis present

## 2023-01-10 DIAGNOSIS — Z8719 Personal history of other diseases of the digestive system: Secondary | ICD-10-CM

## 2023-01-10 DIAGNOSIS — I4821 Permanent atrial fibrillation: Secondary | ICD-10-CM | POA: Diagnosis present

## 2023-01-10 DIAGNOSIS — I472 Ventricular tachycardia, unspecified: Secondary | ICD-10-CM | POA: Diagnosis present

## 2023-01-10 DIAGNOSIS — J90 Pleural effusion, not elsewhere classified: Secondary | ICD-10-CM | POA: Diagnosis present

## 2023-01-10 DIAGNOSIS — Z90711 Acquired absence of uterus with remaining cervical stump: Secondary | ICD-10-CM

## 2023-01-10 DIAGNOSIS — E7800 Pure hypercholesterolemia, unspecified: Secondary | ICD-10-CM | POA: Diagnosis present

## 2023-01-10 DIAGNOSIS — H47019 Ischemic optic neuropathy, unspecified eye: Secondary | ICD-10-CM | POA: Diagnosis present

## 2023-01-10 DIAGNOSIS — R197 Diarrhea, unspecified: Secondary | ICD-10-CM

## 2023-01-10 DIAGNOSIS — Z800 Family history of malignant neoplasm of digestive organs: Secondary | ICD-10-CM

## 2023-01-10 DIAGNOSIS — R531 Weakness: Secondary | ICD-10-CM

## 2023-01-10 DIAGNOSIS — Z8541 Personal history of malignant neoplasm of cervix uteri: Secondary | ICD-10-CM

## 2023-01-10 DIAGNOSIS — K529 Noninfective gastroenteritis and colitis, unspecified: Secondary | ICD-10-CM

## 2023-01-10 DIAGNOSIS — E1142 Type 2 diabetes mellitus with diabetic polyneuropathy: Secondary | ICD-10-CM | POA: Diagnosis present

## 2023-01-10 DIAGNOSIS — Z833 Family history of diabetes mellitus: Secondary | ICD-10-CM

## 2023-01-10 DIAGNOSIS — E785 Hyperlipidemia, unspecified: Secondary | ICD-10-CM | POA: Diagnosis present

## 2023-01-10 DIAGNOSIS — E876 Hypokalemia: Secondary | ICD-10-CM | POA: Diagnosis present

## 2023-01-10 DIAGNOSIS — K52839 Microscopic colitis, unspecified: Principal | ICD-10-CM | POA: Diagnosis present

## 2023-01-10 DIAGNOSIS — R54 Age-related physical debility: Secondary | ICD-10-CM | POA: Diagnosis present

## 2023-01-10 DIAGNOSIS — E860 Dehydration: Secondary | ICD-10-CM

## 2023-01-10 DIAGNOSIS — I5033 Acute on chronic diastolic (congestive) heart failure: Secondary | ICD-10-CM

## 2023-01-10 DIAGNOSIS — N289 Disorder of kidney and ureter, unspecified: Secondary | ICD-10-CM

## 2023-01-10 DIAGNOSIS — Z7901 Long term (current) use of anticoagulants: Secondary | ICD-10-CM

## 2023-01-10 DIAGNOSIS — E119 Type 2 diabetes mellitus without complications: Secondary | ICD-10-CM

## 2023-01-10 DIAGNOSIS — I4819 Other persistent atrial fibrillation: Secondary | ICD-10-CM | POA: Diagnosis present

## 2023-01-10 DIAGNOSIS — J9601 Acute respiratory failure with hypoxia: Secondary | ICD-10-CM | POA: Diagnosis present

## 2023-01-10 DIAGNOSIS — K5730 Diverticulosis of large intestine without perforation or abscess without bleeding: Secondary | ICD-10-CM | POA: Diagnosis present

## 2023-01-10 DIAGNOSIS — I110 Hypertensive heart disease with heart failure: Secondary | ICD-10-CM | POA: Diagnosis present

## 2023-01-10 DIAGNOSIS — Z8249 Family history of ischemic heart disease and other diseases of the circulatory system: Secondary | ICD-10-CM

## 2023-01-10 DIAGNOSIS — Z79899 Other long term (current) drug therapy: Secondary | ICD-10-CM

## 2023-01-10 DIAGNOSIS — K648 Other hemorrhoids: Secondary | ICD-10-CM | POA: Diagnosis present

## 2023-01-10 DIAGNOSIS — R946 Abnormal results of thyroid function studies: Secondary | ICD-10-CM | POA: Diagnosis present

## 2023-01-10 DIAGNOSIS — I4891 Unspecified atrial fibrillation: Secondary | ICD-10-CM | POA: Diagnosis present

## 2023-01-10 DIAGNOSIS — E1169 Type 2 diabetes mellitus with other specified complication: Secondary | ICD-10-CM | POA: Diagnosis present

## 2023-01-10 DIAGNOSIS — Z880 Allergy status to penicillin: Secondary | ICD-10-CM

## 2023-01-10 DIAGNOSIS — D539 Nutritional anemia, unspecified: Secondary | ICD-10-CM | POA: Diagnosis present

## 2023-01-10 DIAGNOSIS — Z8042 Family history of malignant neoplasm of prostate: Secondary | ICD-10-CM

## 2023-01-10 DIAGNOSIS — F418 Other specified anxiety disorders: Secondary | ICD-10-CM | POA: Diagnosis present

## 2023-01-10 DIAGNOSIS — Z79890 Hormone replacement therapy (postmenopausal): Secondary | ICD-10-CM

## 2023-01-10 DIAGNOSIS — F32A Depression, unspecified: Secondary | ICD-10-CM | POA: Diagnosis present

## 2023-01-10 DIAGNOSIS — R893 Abnormal level of substances chiefly nonmedicinal as to source in specimens from other organs, systems and tissues: Secondary | ICD-10-CM | POA: Diagnosis present

## 2023-01-10 DIAGNOSIS — Z85850 Personal history of malignant neoplasm of thyroid: Secondary | ICD-10-CM

## 2023-01-10 DIAGNOSIS — E890 Postprocedural hypothyroidism: Secondary | ICD-10-CM | POA: Diagnosis present

## 2023-01-10 DIAGNOSIS — Z7984 Long term (current) use of oral hypoglycemic drugs: Secondary | ICD-10-CM

## 2023-01-10 DIAGNOSIS — H9190 Unspecified hearing loss, unspecified ear: Secondary | ICD-10-CM | POA: Diagnosis present

## 2023-01-10 DIAGNOSIS — F419 Anxiety disorder, unspecified: Secondary | ICD-10-CM | POA: Diagnosis present

## 2023-01-10 DIAGNOSIS — Z853 Personal history of malignant neoplasm of breast: Secondary | ICD-10-CM

## 2023-01-10 DIAGNOSIS — G4733 Obstructive sleep apnea (adult) (pediatric): Secondary | ICD-10-CM | POA: Diagnosis present

## 2023-01-10 LAB — CBC WITH DIFFERENTIAL/PLATELET
Abs Immature Granulocytes: 0.02 10*3/uL (ref 0.00–0.07)
Basophils Absolute: 0 10*3/uL (ref 0.0–0.1)
Basophils Relative: 0 %
Eosinophils Absolute: 0 10*3/uL (ref 0.0–0.5)
Eosinophils Relative: 0 %
HCT: 35.2 % — ABNORMAL LOW (ref 36.0–46.0)
Hemoglobin: 11.4 g/dL — ABNORMAL LOW (ref 12.0–15.0)
Immature Granulocytes: 0 %
Lymphocytes Relative: 11 %
Lymphs Abs: 0.6 10*3/uL — ABNORMAL LOW (ref 0.7–4.0)
MCH: 32.7 pg (ref 26.0–34.0)
MCHC: 32.4 g/dL (ref 30.0–36.0)
MCV: 100.9 fL — ABNORMAL HIGH (ref 80.0–100.0)
Monocytes Absolute: 0.2 10*3/uL (ref 0.1–1.0)
Monocytes Relative: 5 %
Neutro Abs: 4.5 10*3/uL (ref 1.7–7.7)
Neutrophils Relative %: 84 %
Platelets: 230 10*3/uL (ref 150–400)
RBC: 3.49 MIL/uL — ABNORMAL LOW (ref 3.87–5.11)
RDW: 13.7 % (ref 11.5–15.5)
WBC: 5.3 10*3/uL (ref 4.0–10.5)
nRBC: 0 % (ref 0.0–0.2)

## 2023-01-10 LAB — COMPREHENSIVE METABOLIC PANEL
ALT: 14 U/L (ref 0–44)
AST: 29 U/L (ref 15–41)
Albumin: 3.1 g/dL — ABNORMAL LOW (ref 3.5–5.0)
Alkaline Phosphatase: 55 U/L (ref 38–126)
Anion gap: 9 (ref 5–15)
BUN: 14 mg/dL (ref 8–23)
CO2: 27 mmol/L (ref 22–32)
Calcium: 8.6 mg/dL — ABNORMAL LOW (ref 8.9–10.3)
Chloride: 100 mmol/L (ref 98–111)
Creatinine, Ser: 0.85 mg/dL (ref 0.44–1.00)
GFR, Estimated: >60 mL/min (ref >60)
Glucose, Bld: 182 mg/dL — ABNORMAL HIGH (ref 70–99)
Potassium: 3.8 mmol/L (ref 3.5–5.1)
Sodium: 136 mmol/L (ref 135–145)
Total Bilirubin: 1.2 mg/dL (ref 0.3–1.2)
Total Protein: 6.3 g/dL — ABNORMAL LOW (ref 6.5–8.1)

## 2023-01-10 LAB — LIPASE, BLOOD: Lipase: 35 U/L (ref 11–51)

## 2023-01-10 LAB — PROTIME-INR
INR: 2.1 — ABNORMAL HIGH (ref 0.8–1.2)
Prothrombin Time: 22.9 seconds — ABNORMAL HIGH (ref 11.4–15.2)

## 2023-01-10 MED ORDER — HEPARIN SODIUM (PORCINE) 5000 UNIT/ML IJ SOLN
5000.0000 [IU] | Freq: Three times a day (TID) | INTRAMUSCULAR | Status: DC
  Administered 2023-01-10 – 2023-01-12 (×5): 5000 [IU] via SUBCUTANEOUS
  Filled 2023-01-10 (×5): qty 1

## 2023-01-10 MED ORDER — LORAZEPAM 0.5 MG PO TABS
0.5000 mg | ORAL_TABLET | Freq: Every day | ORAL | Status: DC
  Administered 2023-01-10 – 2023-01-15 (×6): 0.5 mg via ORAL
  Filled 2023-01-10 (×6): qty 1

## 2023-01-10 MED ORDER — ONDANSETRON HCL 4 MG/2ML IJ SOLN: 4.0000 mg | Freq: Four times a day (QID) | INTRAMUSCULAR | Status: DC | PRN

## 2023-01-10 MED ORDER — LACTATED RINGERS IV BOLUS
500.0000 mL | Freq: Once | INTRAVENOUS | Status: AC
  Administered 2023-01-10: 500 mL via INTRAVENOUS

## 2023-01-10 MED ORDER — ACETAMINOPHEN 650 MG RE SUPP: 650.0000 mg | Freq: Four times a day (QID) | RECTAL | Status: DC | PRN

## 2023-01-10 MED ORDER — METHYLPREDNISOLONE SODIUM SUCC 40 MG IJ SOLR
40.0000 mg | Freq: Every day | INTRAMUSCULAR | Status: DC
  Administered 2023-01-10 – 2023-01-16 (×7): 40 mg via INTRAVENOUS
  Filled 2023-01-10 (×7): qty 1

## 2023-01-10 MED ORDER — METOPROLOL TARTRATE 25 MG PO TABS
100.0000 mg | ORAL_TABLET | Freq: Two times a day (BID) | ORAL | Status: DC
  Administered 2023-01-10: 100 mg via ORAL
  Filled 2023-01-10: qty 4

## 2023-01-10 MED ORDER — INSULIN ASPART 100 UNIT/ML IJ SOLN
0.0000 [IU] | Freq: Three times a day (TID) | INTRAMUSCULAR | Status: DC
  Administered 2023-01-11: 2 [IU] via SUBCUTANEOUS
  Filled 2023-01-10: qty 0.09

## 2023-01-10 MED ORDER — SODIUM CHLORIDE 0.9% FLUSH
3.0000 mL | Freq: Two times a day (BID) | INTRAVENOUS | Status: DC
  Administered 2023-01-10 – 2023-01-16 (×12): 3 mL via INTRAVENOUS

## 2023-01-10 MED ORDER — IOHEXOL 300 MG/ML  SOLN
80.0000 mL | Freq: Once | INTRAMUSCULAR | Status: AC | PRN
  Administered 2023-01-10: 80 mL via INTRAVENOUS

## 2023-01-10 MED ORDER — ONDANSETRON HCL 4 MG PO TABS: 4.0000 mg | ORAL_TABLET | Freq: Four times a day (QID) | ORAL | Status: DC | PRN

## 2023-01-10 MED ORDER — LACTATED RINGERS IV SOLN: INTRAVENOUS | Status: AC

## 2023-01-10 MED ORDER — SERTRALINE HCL 50 MG PO TABS
50.0000 mg | ORAL_TABLET | Freq: Every day | ORAL | Status: DC
  Administered 2023-01-11 – 2023-01-16 (×5): 50 mg via ORAL
  Filled 2023-01-10 (×5): qty 1

## 2023-01-10 MED ORDER — ROSUVASTATIN CALCIUM 10 MG PO TABS
10.0000 mg | ORAL_TABLET | Freq: Every day | ORAL | Status: DC
  Administered 2023-01-10 – 2023-01-15 (×6): 10 mg via ORAL
  Filled 2023-01-10 (×2): qty 1
  Filled 2023-01-10: qty 0.5
  Filled 2023-01-10 (×4): qty 1

## 2023-01-10 MED ORDER — LEVOTHYROXINE SODIUM 150 MCG PO TABS
150.0000 ug | ORAL_TABLET | Freq: Every morning | ORAL | Status: DC
  Administered 2023-01-12 – 2023-01-16 (×5): 150 ug via ORAL
  Filled 2023-01-10 (×6): qty 1

## 2023-01-10 MED ORDER — ACETAMINOPHEN 325 MG PO TABS
650.0000 mg | ORAL_TABLET | Freq: Four times a day (QID) | ORAL | Status: DC | PRN
  Administered 2023-01-16: 650 mg via ORAL
  Filled 2023-01-10: qty 2

## 2023-01-10 NOTE — Progress Notes (Signed)
[name] 89 y.o. Mar 05, 1931 [id number]  Assessment & Plan:   Encounter Diagnoses  Name Primary?   Watery diarrhea Yes   Dehydration    Inflammatory bowel disease suspected     She is frail and teetering on the brink and needs to go to the hospital for admission.  Patient and daughter agree.  When there and Eliquis can be held she can have a colonoscopy to get a more definitive diagnosis.  She has had problems with diarrhea intermittently for a year, there was what I think was a spurious falsely low fecal elastase due to dilution from the watery diarrhea.  She has not responded to Creon, she has a positive lactoferrin and fecal calprotectin and numerous infectious tests over the last year that are negative.  She either has microscopic colitis or Crohn's or ulcerative colitis, I think.  Empiric IV steroids makes sense as well.  The gastroenterology inpatient team can see her and further evaluate and treat with the [facility] hospitalist.  There is no reason to continue Creon.  I would recheck for C. difficile on the off chance that the GI profile stool PCR was a false negative.  Given the trajectory of this problem off and on over the last year I suspect a more chronic issue.    Subjective:   Chief Complaint: Diarrhea  HPI 89 year old white woman here with her daughter and a caregiver because of diarrhea problems.  She has seen [place] several times in the last year and has had some episodic diarrhea.  At 1 point she had a low fecal elastase, and because of that she was placed on Creon which has not helped her.  She was seen again recently and had calprotectin and fecal lactoferrin tested and they were both abnormal.  6 or more watery stools in a 24-hour period going round-the-clock.  She is struggling to keep up with Pedialyte and other oral fluids and had some nausea and vomiting yesterday.  She has explosive watery diarrhea with fecal incontinence.  She was hospitalized due to a fall in  January and was having diarrhea then.  Like I said C. difficile testing GI pathogen panel negative on multiple occasions.  Colonoscopy in the remote past.  No history of IBD that I am aware of.  Prior history of Barrett's esophagus, this surveillance discontinued due to age  Wt Readings from Last 3 Encounters:  12/12/22 147 lb 7.8 oz (66.9 kg)  12/07/22 140 lb 3.2 oz (63.6 kg)  06/14/22 158 lb 3.2 oz (71.8 kg)   Fecal calprotectin is 587, lactoferrin positive.  Open parasite GI profile stool PCR negative.  All these tests are from February 19.  TSH was 43 4 weeks ago.  In 2020 3 GI profile was negative as well as a C. difficile toxin. Allergies  Allergen Reactions   Penicillins Itching, Rash and Other (See Comments)    Did it involve swelling of the face/tongue/throat, SOB, or low BP? No Did it involve sudden or severe rash/hives, skin peeling, or any reaction on the inside of your mouth or nose? No Did you need to seek medical attention at a hospital or doctor's office? No When did it last happen?    30+ years   If all above answers are "NO", may proceed with cephalosporin use.    Current Meds  Medication Sig   ACCU-CHEK GUIDE test strip    acetaminophen (TYLENOL) 650 MG CR tablet Take 650 mg by mouth 3 (three) times daily.  apixaban (ELIQUIS) 5 MG TABS tablet Take 1 tablet (5 mg total) by mouth 2 (two) times daily.   Blood Glucose Monitoring Suppl (ACCU-CHEK GUIDE ME) w/Device KIT    cetirizine (ZYRTEC) 10 MG tablet Take 10 mg by mouth daily.   cholecalciferol (VITAMIN D) 25 MCG (1000 UNIT) tablet Take 1,000 Units by mouth daily.   ferrous sulfate 325 (65 FE) MG tablet Take 325 mg by mouth daily with breakfast.   fluticasone (FLONASE) 50 MCG/ACT nasal spray Place 1 spray into both nostrils at bedtime as needed for allergies.    levothyroxine (SYNTHROID) 150 MCG tablet Take 150 mcg by mouth every morning.   lipase/protease/amylase (CREON) 36000 UNITS CPEP capsule Take 3 capsule with  each meal and 2 capsule with snacks(up to 2 snacks daily)   loperamide (IMODIUM) 2 MG capsule Take 1 capsule (2 mg total) by mouth every 4 (four) hours.   LORazepam (ATIVAN) 1 MG tablet Take 0.5 mg by mouth at bedtime. Take additional 0.5 mg if needed   metoprolol tartrate (LOPRESSOR) 100 MG tablet TAKE 1 TABLET(100 MG) BY MOUTH TWICE DAILY (Patient taking differently: Take 100 mg by mouth 2 (two) times daily.)   Polyvinyl Alcohol-Povidone PF 1.4-0.6 % SOLN Place 1 drop into both eyes daily as needed (dry eyes).    potassium chloride (KLOR-CON M) 10 MEQ tablet Take 10 mEq by mouth daily.   predniSONE (DELTASONE) 10 MG tablet Take 4 tablets (40 mg total) by mouth daily with breakfast for 3 days, THEN 3 tablets (30 mg total) daily with breakfast for 3 days, THEN 2 tablets (20 mg total) daily with breakfast for 14 days, THEN 1 tablet (10 mg total) daily with breakfast for 14 days, THEN 0.5 tablets (5 mg total) daily with breakfast for 14 days.   rosuvastatin (CRESTOR) 10 MG tablet Take 10 mg by mouth at bedtime.   sertraline (ZOLOFT) 50 MG tablet Take 50 mg by mouth daily.   vitamin B-12 (CYANOCOBALAMIN) 500 MCG tablet Take 500 mcg by mouth daily.   zinc gluconate 50 MG tablet Take 50 mg by mouth daily.   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   Allergy    SEASONAL   Anemia    years ago   Anticoagulated by anticoagulation treatment - Eliquis 03/30/2014   Started May 2015    Anxiety state 10/13/2010   Qualifier: Diagnosis of  By: Nils Pyle CMA (AAMA), Leisha     Arthritis    "all my joints" (03/31/2014)   Barrett's esophagus    Breast cancer ([place])    s/p right breast lumpectomy   CARDIAC MURMUR 10/13/2010   Qualifier: Diagnosis of  By: Nils Pyle CMA (AAMA), Leisha     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    Cervical cancer ([place])    Chronic cough 01/02/2017   GERD, hiatal hernia Doubt amio   COLONIC POLYPS, ADENOMATOUS, HX OF 10/13/2010   Qualifier: Diagnosis of  By: Nils Pyle CMA (AAMA), Leisha     Degenerative disorder of eye 07/25/2012   Diabetes mellitus without complication ([place])    Dilated cardiomyopathy ([place]) 08/27/2018   DIVERTICULOSIS, COLON 10/13/2010   Qualifier: Diagnosis of  By: Nils Pyle CMA (AAMA), Leisha     DJD (degenerative joint disease) of knee    Dysrhythmia    Afib   Echocardiogram  07/2020    Echo 9/21: EF 70-75, moderate LVH, GR 1 DD, RVSP 40.5 (mildly elevated), normal RVSF, moderate LAE, mild RAE, mild MR, trivial AI   EROSIVE ESOPHAGITIS 10/13/2010   Qualifier: Diagnosis of  By: Nils Pyle CMA (AAMA), Leisha     Error, refractive, myopia 07/09/2012   Essential hypertension 10/13/2010   Qualifier: Diagnosis of  By: Nils Pyle CMA (AAMA), Rueben Bash, intermittent 07/09/2012   GERD 10/14/2010   Qualifier: Diagnosis of  By: Sharlett Iles MD Cline Cools R    GERD (gastroesophageal reflux disease)    Glaucoma    H/O hiatal hernia    Heart murmur    Hiatal hernia s/p robotic repair & fundoplication [id number]   Qualifier: Diagnosis of  By: Nils Pyle CMA (AAMA), Mearl Latin     History of laser assisted in situ keratomileusis 07/25/2012   Hx of transesophageal echocardiography (TEE) for monitoring    TEE (03/2014): No LAA clot, moderate LAE, core triatriatum type structure in RA with no stenosis, normal EF 55-60%, mild MR   HYPERCHOLESTEROLEMIA 10/13/2010   Qualifier: Diagnosis of  By: Nils Pyle CMA (AAMA), Leisha     Hypertension    Hypothyroidism, postsurgical    Iron Deficiency Anemia 10/14/2010   Qualifier: Diagnosis of  By: Sharlett Iles MD Cline Cools R    Irritable bowel syndrome 10/13/2010   Qualifier: Diagnosis of  By: Nils Pyle CMA (AAMA), Mearl Latin     Low serum potassium    Lumbar stenosis with neurogenic claudication 04/20/2016   Malignant neoplasm of breast ([place])  02/25/2016   Migraine    "last one was years ago" (03/31/2014)   MIGRAINE HEADACHE 10/13/2010   Qualifier: Diagnosis of  By: Nils Pyle CMA (AAMA), Leisha     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   Overview:  X(T)    OSA on CPAP    settings at 10-11    OSTEOARTHRITIS 10/13/2010   Qualifier: Diagnosis of  By: Nils Pyle CMA (AAMA), Leisha     Persistent atrial fibrillation 03/31/2015   DLCO dropped from 100% in 2015-50% in 01/2017   Post-surgical hypothyroidism 04/09/2014   RECTAL FISSURE 10/13/2010   Qualifier: Diagnosis of  By: Nils Pyle CMA (AAMA), Mearl Latin     S/P Nissen fundoplication (without gastrostomy tube) procedure 05/25/2017   Sleep apnea    THYROID CANCER 10/13/2010   Qualifier: Diagnosis of  By: Nils Pyle CMA Deborra Medina), Mearl Latin     Thyroid cancer [facility])    s/p thyroidectomy   Type II diabetes mellitus ([place])    type II    Past Surgical History:  Procedure Laterality Date   ABDOMINAL HYSTERECTOMY     "partial"   BREAST BIOPSY Right    BREAST LUMPECTOMY Right    CARDIOVERSION N/A 03/30/2014   Procedure: CARDIOVERSION - BEDSIDE;  Surgeon: [name], MD;  Location: [place];  Service: Cardiovascular;  Laterality: N/A;   CARDIOVERSION N/A 03/31/2014   Procedure: CARDIOVERSION  (BEDSIDE) ;  Surgeon: Lelon Perla, MD;  Location: [place];  Service: Cardiovascular;  Laterality: N/A;   CARDIOVERSION N/A 04/27/2014   Procedure: CARDIOVERSION;  Surgeon: Darlin Coco, MD;  Location[facility] ENDOSCOPY;  Service: Cardiovascular;  Laterality: N/A;   CARDIOVERSION N/A 04/13/2015   Procedure: CARDIOVERSION;  Surgeon: Thayer Headings, MD;  Location: [place];  Service: Cardiovascular;  Laterality: N/A;   CARDIOVERSION N/A 01/08/2017   Procedure: CARDIOVERSION;  Surgeon: Sanda Klein, MD;  Location: [place] ENDOSCOPY;  Service: Cardiovascular;  Laterality: N/A;   CARDIOVERSION N/A 09/19/2017   Procedure: CARDIOVERSION;  Surgeon: [name], MD;  Location: [place];  Service: Cardiovascular;  Laterality:  N/A;   CARDIOVERSION N/A 10/25/2017   Procedure: CARDIOVERSION;  Surgeon: Thayer Headings, MD;  Location: [place];  Service: Cardiovascular;  Laterality: N/A;   CARDIOVERSION N/A 12/27/2018   Procedure: CARDIOVERSION;  Surgeon: [name], MD;  Location: [place];  Service: Cardiovascular;  Laterality: N/A;   CARDIOVERSION N/A 05/20/2019   Procedure: CARDIOVERSION;  Surgeon: [name], MD;  Location: [place];  Service: Cardiovascular;  Laterality: N/A;   CARDIOVERSION N/A 12/10/2019   Procedure: CARDIOVERSION;  Surgeon: Pixie Casino, MD;  Location: [place];  Service: Cardiovascular;  Laterality: N/A;   CARDIOVERSION N/A 12/31/2019   Procedure: CARDIOVERSION;  Surgeon: Sanda Klein, MD;  Location: [place];  Service: Cardiovascular;  Laterality: N/A;   CARDIOVERSION N/A 10/21/2020   Procedure: CARDIOVERSION;  Surgeon: [name], MD;  Location: [place];  Service: Cardiovascular;  Laterality: N/A;   CARDIOVERSION N/A 12/03/2020   Procedure: CARDIOVERSION;  Surgeon: Jerline Pain, MD;  Location: [place];  Service: Cardiovascular;  Laterality: N/A;   CARDIOVERSION N/A 07/11/2021   Procedure: CARDIOVERSION;  Surgeon: Pixie Casino, MD;  Location: [place];  Service: Cardiovascular;  Laterality: N/A;   CARPAL TUNNEL RELEASE Right    CATARACT EXTRACTION W/ INTRAOCULAR LENS  IMPLANT, BILATERAL Bilateral    COLONOSCOPY     EXCISIONAL HEMORRHOIDECTOMY     INSERTION OF MESH N/A 05/25/2017   Procedure: INSERTION OF MESH;  Surgeon: Ralene Ok, MD;  Location: [facility];  Service: General;  Laterality: N/A;   TEE WITHOUT CARDIOVERSION N/A 03/30/2014   Procedure: TRANSESOPHAGEAL ECHOCARDIOGRAM (TEE);  Surgeon: [name], MD;  [facility];  Service: Cardiovascular;  Laterality: N/A;   THYROIDECTOMY     TONSILLECTOMY     Social History   Social History Narrative   Not on file   family history includes Diabetes in her  brother, brother, brother, brother, brother, brother, brother, child, sister, sister, and sister; Heart attack in her brother; Heart disease in her brother, brother, brother, brother, brother, brother, and sister; Pancreatic cancer in her sister; Prostate cancer in her father.   Review of Systems As per HPI  Objective:   Physical Exam '@BP'$  110/68   Pulse (!) 110   Ht 5' 5.5" (1.664 m)   LMP  (LMP Unknown)   BMI 24.17 kg/m @  General:  NAD but frail and somewhat asthenic appearing elderly white woman in a wheelchair, she is hard of hearing Eyes:   anicteric Lungs:  clear Heart::  S1S2 no rubs, murmurs or gallops Abdomen:  soft and nontender,     Data Reviewed:  See HPI

## 2023-01-10 NOTE — Assessment & Plan Note (Signed)
Indeterminate exophytic lesion from the upper pole of the left kidney incidentally noted on CT andmeasures 12 mm, previously 9 mm.  Nonemergent renal protocol MRI for characterization recommended on an outpatient basis.

## 2023-01-10 NOTE — H&P (Signed)
History and Physical    [name] [id number] DOB: [birth year] DOA: 01/10/2023  PCP: Corliss Blacker, MD  Patient coming from: Home  I have personally briefly reviewed patient's old medical records in [place]  Chief Complaint: Generalized weakness, diarrhea  HPI: [name] is a 89 y.o. female with medical history significant for persistent A-fib on Eliquis, T2DM, HTN, hypothyroidism, HLD, GERD, IDA, s/p hiatal hernia repair and Nissen fundoplication [id number], depression/anxiety who presented to the ED per her GI specialist recommendation for further evaluation and management of generalized weakness associated with persistent diarrhea.  Patient reports ongoing diarrhea for about 1 year.  This has been worse the last month or so with more frequent watery bowel movements occurring up to 6 times per day.  Patient daughter states that stools occur as explosive watery diarrhea.  She has been on Creon due to prior workup concerning for pancreatic insufficiency however she has not had any significant improvement in her symptoms.  She has not had any abdominal pain.  She has had some nausea and vomiting the last 2 days but none today.  She been feeling generally weak with occasional lightheadedness.  She is feeling dehydrated.  She has not seen any blood in her bowel movements.  She has been following with [facility] as an outpatient.  Recently had abnormal calprotectin and fecal lactoferrin testing with negative stool GI profile.  She was seen by Dr. Carlean Purl in clinic earlier today who recommended admission to the hospital for hydration and further GI workup with potential colonoscopy.  ED Course  Labs/Imaging on admission: I have personally reviewed following labs and imaging studies.  Initial vitals showed BP 105/77, pulse 118, RR 18, temp 97.6 F, SpO2 94% on room air.  Labs show WBC 5.3, hemoglobin 11.4, platelets 230,000, sodium 136, potassium 3.8, bicarb 27, BUN 14,  creatinine 0.85, serum glucose 182, LFTs within normal limits, lipase 35.  Patient was given 500 cc LR.  EDP discussed with on-call [facility], Dr. Lorenso Courier, who recommended IV Solu-Medrol 40 mg daily and will consult tomorrow.  The hospitalist service was consulted to admit for further evaluation and management.  Review of Systems: All systems reviewed and are negative except as documented in history of present illness above.   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   Allergy    SEASONAL   Anemia    years ago   Anticoagulated by anticoagulation treatment - Eliquis 03/30/2014   Started May 2015    Anxiety state 10/13/2010   Qualifier: Diagnosis of  By: Nils Pyle CMA (AAMA), Leisha     Arthritis    "all my joints" (03/31/2014)   Barrett's esophagus    Breast cancer ([place])    s/p right breast lumpectomy   CARDIAC MURMUR 10/13/2010   Qualifier: Diagnosis of  By: Nils Pyle CMA (AAMA), Leisha     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   Cervical cancer ([place])    Chronic cough 01/02/2017   GERD, hiatal hernia Doubt amio   COLONIC POLYPS, ADENOMATOUS, HX OF 10/13/2010   Qualifier: Diagnosis of  By: Nils Pyle CMA (AAMA), Leisha     Degenerative disorder of eye 07/25/2012   Diabetes mellitus without complication ([place])    Dilated cardiomyopathy ([place]) 08/27/2018   DIVERTICULOSIS, COLON 10/13/2010   Qualifier:  Diagnosis of  By: Nils Pyle CMA Deborra Medina), Leisha     DJD (degenerative joint disease) of knee    Dysrhythmia    Afib   Echocardiogram 07/2020    Echo 9/21: EF 70-75, moderate LVH, GR 1 DD, RVSP 40.5 (mildly elevated), normal RVSF, moderate LAE, mild RAE, mild MR, trivial AI   EROSIVE ESOPHAGITIS 10/13/2010   Qualifier: Diagnosis of  By: Nils Pyle CMA (AAMA), Leisha     Error, refractive, myopia 07/09/2012    Essential hypertension 10/13/2010   Qualifier: Diagnosis of  By: Nils Pyle CMA (AAMA), Rueben Bash, intermittent 07/09/2012   GERD 10/14/2010   Qualifier: Diagnosis of  By: Sharlett Iles MD Cline Cools R    GERD (gastroesophageal reflux disease)    Glaucoma    H/O hiatal hernia    Heart murmur    Hiatal hernia s/p robotic repair & fundoplication [id number]   Qualifier: Diagnosis of  By: Nils Pyle CMA (AAMA), Mearl Latin     History of laser assisted in situ keratomileusis 07/25/2012   Hx of transesophageal echocardiography (TEE) for monitoring    TEE (03/2014): No LAA clot, moderate LAE, core triatriatum type structure in RA with no stenosis, normal EF 55-60%, mild MR   HYPERCHOLESTEROLEMIA 10/13/2010   Qualifier: Diagnosis of  By: Nils Pyle CMA (AAMA), Leisha     Hypertension    Hypothyroidism, postsurgical    Iron Deficiency Anemia 10/14/2010   Qualifier: Diagnosis of  By: Sharlett Iles MD Cline Cools R    Irritable bowel syndrome 10/13/2010   Qualifier: Diagnosis of  By: Nils Pyle CMA (AAMA), Mearl Latin     Low serum potassium    Lumbar stenosis with neurogenic claudication 04/20/2016   Malignant neoplasm of breast ([place]) 02/25/2016   Migraine    "last one was years ago" (03/31/2014)   MIGRAINE HEADACHE 10/13/2010   Qualifier: Diagnosis of  By: Nils Pyle CMA (AAMA), Leisha     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   Overview:  X(T)    OSA on CPAP    settings at 10-11    OSTEOARTHRITIS 10/13/2010   Qualifier: Diagnosis of  By: Nils Pyle CMA (AAMA), Leisha     Persistent atrial fibrillation 03/31/2015   DLCO dropped from 100% in 2015-50% in 01/2017   Post-surgical hypothyroidism 04/09/2014   RECTAL FISSURE 10/13/2010   Qualifier: Diagnosis of  By: Nils Pyle CMA (AAMA), Mearl Latin     S/P Nissen fundoplication (without gastrostomy tube) procedure 05/25/2017   Sleep apnea    THYROID CANCER 10/13/2010   Qualifier: Diagnosis of  By: Nils Pyle CMA Deborra Medina), Mearl Latin     Thyroid cancer [facility])    s/p thyroidectomy    Type II diabetes mellitus ([place])    type II     Past Surgical History:  Procedure Laterality Date   ABDOMINAL HYSTERECTOMY     "partial"   BREAST BIOPSY Right    BREAST LUMPECTOMY Right    CARDIOVERSION N/A 03/30/2014   Procedure: CARDIOVERSION - BEDSIDE;  Surgeon: [name], MD;  Location: [place];  Service: Cardiovascular;  Laterality: N/A;   CARDIOVERSION N/A 03/31/2014   Procedure: CARDIOVERSION  (BEDSIDE) ;  Surgeon: Lelon Perla, MD;  Location: [place];  Service: Cardiovascular;  Laterality: N/A;   CARDIOVERSION N/A 04/27/2014   Procedure: CARDIOVERSION;  Surgeon: Darlin Coco, MD;  Location: [facility] ENDOSCOPY;  Service: Cardiovascular;  Laterality: N/A;   CARDIOVERSION N/A 04/13/2015   Procedure: CARDIOVERSION;  Surgeon: Thayer Headings, MD;  Location: [place];  Service: Cardiovascular;  Laterality: N/A;   CARDIOVERSION  N/A 01/08/2017   Procedure: CARDIOVERSION;  Surgeon: Sanda Klein, MD;  Location: [place];  Service: Cardiovascular;  Laterality: N/A;   CARDIOVERSION N/A 09/19/2017   Procedure: CARDIOVERSION;  Surgeon: [name], MD;  Location: [place];  Service: Cardiovascular;  Laterality: N/A;   CARDIOVERSION N/A 10/25/2017   Procedure: CARDIOVERSION;  Surgeon: Acie Fredrickson Wonda Cheng, MD;  Location: [place];  Service: Cardiovascular;  Laterality: N/A;   CARDIOVERSION N/A 12/27/2018   Procedure: CARDIOVERSION;  Surgeon: [name], MD;  Location: [place];  Service: Cardiovascular;  Laterality: N/A;   CARDIOVERSION N/A 05/20/2019   Procedure: CARDIOVERSION;  Surgeon: [name], MD;  Location: [place];  Service: Cardiovascular;  Laterality: N/A;   CARDIOVERSION N/A 12/10/2019   Procedure: CARDIOVERSION;  Surgeon: Pixie Casino, MD;  Location: [place];  Service: Cardiovascular;  Laterality: N/A;   CARDIOVERSION N/A 12/31/2019   Procedure: CARDIOVERSION;  Surgeon: Sanda Klein, MD;  Location: [place];  Service:  Cardiovascular;  Laterality: N/A;   CARDIOVERSION N/A 10/21/2020   Procedure: CARDIOVERSION;  Surgeon: [name], MD;  Location: [place];  Service: Cardiovascular;  Laterality: N/A;   CARDIOVERSION N/A 12/03/2020   Procedure: CARDIOVERSION;  Surgeon: Jerline Pain, MD;  Location: [place];  Service: Cardiovascular;  Laterality: N/A;   CARDIOVERSION N/A 07/11/2021   Procedure: CARDIOVERSION;  Surgeon: Pixie Casino, MD;  Location[facility] ENDOSCOPY;  Service: Cardiovascular;  Laterality: N/A;   CARPAL TUNNEL RELEASE Right    CATARACT EXTRACTION W/ INTRAOCULAR LENS  IMPLANT, BILATERAL Bilateral    COLONOSCOPY     EXCISIONAL HEMORRHOIDECTOMY     INSERTION OF MESH N/A 05/25/2017   Procedure: INSERTION OF MESH;  Surgeon: Ralene Ok, MD;  Location: [facility];  Service: General;  Laterality: N/A;   TEE WITHOUT CARDIOVERSION N/A 03/30/2014   Procedure: TRANSESOPHAGEAL ECHOCARDIOGRAM (TEE);  Surgeon: [name], MD;  Location: [facility] ENDOSCOPY;  Service: Cardiovascular;  Laterality: N/A;   THYROIDECTOMY     TONSILLECTOMY      Social History:  reports that she has never smoked. She has never used smokeless tobacco. She reports that she does not drink alcohol and does not use drugs.  Allergies  Allergen Reactions   Penicillins Itching, Rash and Other (See Comments)    Did it involve swelling of the face/tongue/throat, SOB, or low BP? No Did it involve sudden or severe rash/hives, skin peeling, or any reaction on the inside of your mouth or nose? No Did you need to seek medical attention at a hospital or doctor's office? No When did it last happen?    30+ years   If all above answers are "NO", may proceed with cephalosporin use.     Family History  Problem Relation Age of Onset   Prostate cancer Father    Diabetes Sister    Heart disease Sister    Heart attack Brother    Heart disease Brother    Diabetes Brother    Diabetes Sister    Diabetes Sister    Pancreatic cancer  Sister    Diabetes Brother    Heart disease Brother    Diabetes Brother    Heart disease Brother    Diabetes Brother    Heart disease Brother    Diabetes Brother    Heart disease Brother    Diabetes Brother    Heart disease Brother    Diabetes Brother    Diabetes Child      Prior to Admission medications   Medication  Sig Start Date End Date Taking? Authorizing Provider  ACCU-CHEK GUIDE test strip  01/09/20   [provider]  acetaminophen (TYLENOL) 650 MG CR tablet Take 650 mg by mouth 3 (three) times daily.    [provider]  apixaban (ELIQUIS) 5 MG TABS tablet Take 1 tablet (5 mg total) by mouth 2 (two) times daily. 12/18/22   Sherran Needs, NP  Blood Glucose Monitoring Suppl (ACCU-CHEK GUIDE ME) w/Device KIT  01/09/20   [provider]  cetirizine (ZYRTEC) 10 MG tablet Take 10 mg by mouth daily.    [provider]  cholecalciferol (VITAMIN D) 25 MCG (1000 UNIT) tablet Take 1,000 Units by mouth daily.    [provider]  ferrous sulfate 325 (65 FE) MG tablet Take 325 mg by mouth daily with breakfast.    [provider]  fluticasone (FLONASE) 50 MCG/ACT nasal spray Place 1 spray into both nostrils at bedtime as needed for allergies.     [provider]  levothyroxine (SYNTHROID) 150 MCG tablet Take 150 mcg by mouth every morning. 12/25/22   [provider]  lipase/protease/amylase (CREON) 36000 UNITS CPEP capsule Take 3 capsule with each meal and 2 capsule with snacks(up to 2 snacks daily) 12/29/22   Levin Erp, PA  loperamide (IMODIUM) 2 MG capsule Take 1 capsule (2 mg total) by mouth every 4 (four) hours. 12/27/22   Levin Erp, PA  LORazepam (ATIVAN) 1 MG tablet Take 0.5 mg by mouth at bedtime. Take additional 0.5 mg if needed    [provider]  metoprolol tartrate (LOPRESSOR) 100 MG tablet TAKE 1 TABLET(100 MG) BY MOUTH TWICE DAILY Patient taking differently: Take 100 mg by mouth  2 (two) times daily. 11/30/22   Sherran Needs, NP  Polyvinyl Alcohol-Povidone PF 1.4-0.6 % SOLN Place 1 drop into both eyes daily as needed (dry eyes).     [provider]  potassium chloride (KLOR-CON M) 10 MEQ tablet Take 10 mEq by mouth daily. 12/22/22   [provider]  predniSONE (DELTASONE) 10 MG tablet Take 4 tablets (40 mg total) by mouth daily with breakfast for 3 days, THEN 3 tablets (30 mg total) daily with breakfast for 3 days, THEN 2 tablets (20 mg total) daily with breakfast for 14 days, THEN 1 tablet (10 mg total) daily with breakfast for 14 days, THEN 0.5 tablets (5 mg total) daily with breakfast for 14 days. 01/09/23 02/26/23  Gatha Mayer, MD  rosuvastatin (CRESTOR) 10 MG tablet Take 10 mg by mouth at bedtime.    [provider]  sertraline (ZOLOFT) 50 MG tablet Take 50 mg by mouth daily.    [provider]  vitamin B-12 (CYANOCOBALAMIN) 500 MCG tablet Take 500 mcg by mouth daily.    [provider]  zinc gluconate 50 MG tablet Take 50 mg by mouth daily.    [provider]    Physical Exam: Vitals:   01/10/23 1527 01/10/23 1529 01/10/23 1828 01/10/23 2044  BP: 105/77  129/83   Pulse: (!) 118  92   Resp: 18  18   Temp: 97.6 F (36.4 C)  97.8 F (36.6 C)   TempSrc: Oral     SpO2: 94%  96% 98%  Weight:  66.9 kg    Height:  '5\' 5"'$  (1.651 m)     Constitutional: Resting supine in bed, NAD, calm, comfortable Eyes: EOMI, lids and conjunctivae normal ENMT: Mucous membranes are dry. Posterior pharynx clear of any  exudate or lesions.Normal dentition.  Neck: normal, supple, no masses. Respiratory: clear to auscultation bilaterally, no wheezing, no crackles. Normal respiratory effort. No accessory muscle use.  Cardiovascular: Irregularly irregular, no murmurs / rubs / gallops. No extremity edema. 2+ pedal pulses. Abdomen: no tenderness, no masses palpated.  Musculoskeletal: no clubbing / cyanosis. No joint deformity upper and  lower extremities. Good ROM, no contractures. Normal muscle tone.  Skin: no rashes, lesions, ulcers. No induration Neurologic: Sensation intact. Strength 5/5 in all 4.  Psychiatric:  Alert and oriented x 3. Normal mood.   EKG: Not performed.  Assessment/Plan Principal Problem:   Diarrhea Active Problems:   Persistent atrial fibrillation (HCC)   Generalized weakness   DMII (diabetes mellitus, type 2) (HCC)   Post-surgical hypothyroidism   Hyperlipidemia associated with type 2 diabetes mellitus ([place])   Depression with anxiety   Pleural effusion on right   Lesion of left native kidney   AIDALY ESCATEL is a 89 y.o. female with medical history significant for persistent A-fib on Eliquis, T2DM, HTN, hypothyroidism, HLD, GERD, IDA, depression/anxiety who is admitted with generalized weakness and dehydration in setting of persistent diarrhea.  Assessment and Plan: * Diarrhea Persistent watery diarrhea, more frequent recently.  Follow-up with [facility] who recommended admission for hydration and further workup with consideration of colonoscopy. -[facility] to consult -IV fluid hydration overnight -KUB -Hold Creon -Started on IV Solu-Medrol 40 mg daily per GI recommendation -Follow C. Difficile -Holding Eliquis (last dose 2/28 AM per family)  ADDENDUM: KUB showed possible colonic ileus versus distal colonic obstruction.  Follow-up CT A/P negative for obstruction but does show possible  Colitis changes involving the ascending colon.  Persistent atrial fibrillation (HCC) -Continue Lopressor 100 mg twice daily -Holding Eliquis for potential colonoscopy  Generalized weakness Secondary to volume depletion from GI losses. -IV fluid hydration overnight -PT eval, fall precautions  DMII (diabetes mellitus, type 2) ([place]) Home metformin was held on last admission due to possible GI side effect.  Last A1c improved to 5.9% 12/12/2022. -Placed on sensitive sliding scale  insulin  Post-surgical hypothyroidism TSH was elevated 43.641 on 12/13/2022.  This was in setting of acute illness and concern for medication compliance. -Continue current Synthroid dose and repeat TSH  Lesion of left native kidney Indeterminate exophytic lesion from the upper pole of the left kidney incidentally noted on CT andmeasures 12 mm, previously 9 mm.  Nonemergent renal protocol MRI for characterization recommended on an outpatient basis.  Pleural effusion on right Moderate right and small left pleural effusions incidentally noted on CT imaging.  Currently no respiratory symptoms, continue to monitor.  Depression with anxiety Continue sertraline and Ativan.  Hyperlipidemia associated with type 2 diabetes mellitus (HCC) Continue rosuvastatin.  DVT prophylaxis: heparin injection 5,000 Units Start: 01/10/23 2200 Code Status: Full code, confirmed with patient on admission Family Communication: Daughter at bedside Disposition Plan: From home, dispo pending clinical progress Consults called: [facility] Severity of Illness: The appropriate patient status for this patient is OBSERVATION. Observation status is judged to be reasonable and necessary in order to provide the required intensity of service to ensure the patient's safety. The patient's presenting symptoms, physical exam findings, and initial radiographic and laboratory data in the context of their medical condition is felt to place them at decreased risk for further clinical deterioration. Furthermore, it is anticipated that the patient will be medically stable for discharge from the hospital within 2 midnights of admission.   Zada Finders MD [facility]  If 7PM-7AM, please contact night-coverage [email or web address]  01/10/2023, 9:27 PM

## 2023-01-10 NOTE — Assessment & Plan Note (Signed)
Secondary to volume depletion from GI losses. -IV fluid hydration overnight -PT eval, fall precautions

## 2023-01-10 NOTE — Assessment & Plan Note (Signed)
Moderate right and small left pleural effusions incidentally noted on CT imaging.  Currently no respiratory symptoms, continue to monitor.

## 2023-01-10 NOTE — Assessment & Plan Note (Signed)
-  Continue Lopressor 100 mg twice daily -Holding Eliquis for potential colonoscopy

## 2023-01-10 NOTE — Assessment & Plan Note (Signed)
Home metformin was held on last admission due to possible GI side effect.  Last A1c improved to 5.9% 12/12/2022. -Placed on sensitive sliding scale insulin

## 2023-01-10 NOTE — ED Provider Notes (Signed)
[place] AT [facility] Provider Note   CSN: [id number] Arrival date & time: 01/10/23  1432     History  Chief Complaint  Patient presents with   Diarrhea   Emesis    [name] is a 89 y.o. female.   Diarrhea Associated symptoms: vomiting   Emesis Associated symptoms: diarrhea   Patient presents with diarrhea.  Generalized weakness.  Sent in by GI for admission.  Has had diarrhea for a year.  Decreased oral intake.  Thought to be may be a microscopic colitis.  Sent in for fluids IV steroids further workup and colonoscopy.  I have reviewed note by Dr. Arelia Longest.    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   Allergy    SEASONAL   Anemia    years ago   Anticoagulated by anticoagulation treatment - Eliquis 03/30/2014   Started May 2015    Anxiety state 10/13/2010   Qualifier: Diagnosis of  By: Nils Pyle CMA (AAMA), Leisha     Arthritis    "all my joints" (03/31/2014)   Barrett's esophagus    Breast cancer ([place])    s/p right breast lumpectomy   CARDIAC MURMUR 10/13/2010   Qualifier: Diagnosis of  By: Nils Pyle CMA (AAMA), Leisha     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   Cervical cancer ([place])    Chronic cough 01/02/2017   GERD, hiatal hernia [place] am[place]   COLONIC POLYPS, ADENOMATOUS, HX OF 10/13/2010   Qualifier: Diagnosis of  By: Nils Pyle CMA (AAMA), Leisha     Degenerative disorder of eye 07/25/2012   Diabetes mellitus without complication ([place])    Dilated cardiomyopathy ([place]) 08/27/2018   DIVERTICULOSIS, COLON 10/13/2010   Qualifier: Diagnosis of  By: Nils Pyle CMA (AAMA), Leisha     DJD (degenerative joint disease) of knee    Dysrhythmia    Afib   Echocardiogram 07/2020    Echo 9/21: EF 70-75, moderate LVH, GR 1 DD, RVSP 40.5 (mildly elevated),  normal RVSF, moderate LAE, mild RAE, mild MR, trivial AI   EROSIVE ESOPHAGITIS 10/13/2010   Qualifier: Diagnosis of  By: Nils Pyle CMA (AAMA), Leisha     Error, refractive, myopia 07/09/2012   Essential hypertension 10/13/2010   Qualifier: Diagnosis of  By: Nils Pyle CMA (AAMA), Rueben Bash, intermittent 07/09/2012   GERD 10/14/2010   Qualifier: Diagnosis of  By: Sharlett Iles MD Cline Cools R    GERD (gastroesophageal reflux disease)    Glaucoma    H/O hiatal hernia    Heart murmur    Hiatal hernia s/p robotic repair & fundoplication [id number]   Qualifier: Diagnosis of  By: Nils Pyle CMA (AAMA), Mearl Latin     History of laser assisted in situ keratomileusis 07/25/2012   Hx of transesophageal echocardiography (TEE) for monitoring    TEE (03/2014): No LAA clot, moderate LAE, core triatriatum type structure in RA with no stenosis, normal EF 55-60%, mild MR   HYPERCHOLESTEROLEMIA 10/13/2010   Qualifier: Diagnosis of  By: Nils Pyle CMA (AAMA), Leisha     Hypertension    Hypothyroidism, postsurgical    Iron Deficiency Anemia 10/14/2010   Qualifier: Diagnosis of  By: Sharlett Iles MD Cline Cools R    Irritable bowel syndrome 10/13/2010   Qualifier: Diagnosis of  By: Nils Pyle CMA (AAMA), Mearl Latin  Low serum potassium    Lumbar stenosis with neurogenic claudication 04/20/2016   Malignant neoplasm of breast ([place]) 02/25/2016   Migraine    "last one was years ago" (03/31/2014)   MIGRAINE HEADACHE 10/13/2010   Qualifier: Diagnosis of  By: Nils Pyle CMA (AAMA), Leisha     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   Overview:  X(T)    OSA on CPAP    settings at 10-11    OSTEOARTHRITIS 10/13/2010   Qualifier: Diagnosis of  By: Nils Pyle CMA (AAMA), Leisha     Persistent atrial fibrillation 03/31/2015   DLCO dropped from 100% in 2015-50% in 01/2017   Post-surgical hypothyroidism 04/09/2014   RECTAL FISSURE 10/13/2010   Qualifier: Diagnosis of  By: Nils Pyle CMA (AAMA), Mearl Latin     S/P Nissen fundoplication (without  gastrostomy tube) procedure 05/25/2017   Sleep apnea    THYROID CANCER 10/13/2010   Qualifier: Diagnosis of  By: Nils Pyle CMA Deborra Medina), Mearl Latin     Thyroid cancer [facility])    s/p thyroidectomy   Type II diabetes mellitus ([place])    type II     Home Medications Prior to Admission medications   Medication Sig Start Date End Date Taking? Authorizing Provider  ACCU-CHEK GUIDE test strip  01/09/20   [provider]  acetaminophen (TYLENOL) 650 MG CR tablet Take 650 mg by mouth 3 (three) times daily.    [provider]  apixaban (ELIQUIS) 5 MG TABS tablet Take 1 tablet (5 mg total) by mouth 2 (two) times daily. 12/18/22   Sherran Needs, NP  Blood Glucose Monitoring Suppl (ACCU-CHEK GUIDE ME) w/Device KIT  01/09/20   [provider]  cetirizine (ZYRTEC) 10 MG tablet Take 10 mg by mouth daily.    [provider]  cholecalciferol (VITAMIN D) 25 MCG (1000 UNIT) tablet Take 1,000 Units by mouth daily.    [provider]  ferrous sulfate 325 (65 FE) MG tablet Take 325 mg by mouth daily with breakfast.    [provider]  fluticasone (FLONASE) 50 MCG/ACT nasal spray Place 1 spray into both nostrils at bedtime as needed for allergies.     [provider]  levothyroxine (SYNTHROID) 150 MCG tablet Take 150 mcg by mouth every morning. 12/25/22   [provider]  lipase/protease/amylase (CREON) 36000 UNITS CPEP capsule Take 3 capsule with each meal and 2 capsule with snacks(up to 2 snacks daily) 12/29/22   Levin Erp, PA  loperamide (IMODIUM) 2 MG capsule Take 1 capsule (2 mg total) by mouth every 4 (four) hours. 12/27/22   Levin Erp, PA  LORazepam (ATIVAN) 1 MG tablet Take 0.5 mg by mouth at bedtime. Take additional 0.5 mg if needed    [provider]  metoprolol tartrate (LOPRESSOR) 100 MG tablet TAKE 1 TABLET(100 MG) BY MOUTH TWICE DAILY Patient taking differently: Take 100 mg by mouth 2 (two) times daily. 11/30/22    Sherran Needs, NP  Polyvinyl Alcohol-Povidone PF 1.4-0.6 % SOLN Place 1 drop into both eyes daily as needed (dry eyes).     [provider]  potassium chloride (KLOR-CON M) 10 MEQ tablet Take 10 mEq by mouth daily. 12/22/22   [provider]  predniSONE (DELTASONE) 10 MG tablet Take 4 tablets (40 mg total) by mouth daily with breakfast for 3 days, THEN 3 tablets (30 mg total) daily with breakfast for 3 days, THEN 2 tablets (20 mg total) daily with breakfast for 14 days, THEN 1 tablet (10 mg total) daily with  breakfast for 14 days, THEN 0.5 tablets (5 mg total) daily with breakfast for 14 days. 01/09/23 02/26/23  Gatha Mayer, MD  rosuvastatin (CRESTOR) 10 MG tablet Take 10 mg by mouth at bedtime.    [provider]  sertraline (ZOLOFT) 50 MG tablet Take 50 mg by mouth daily.    [provider]  vitamin B-12 (CYANOCOBALAMIN) 500 MCG tablet Take 500 mcg by mouth daily.    [provider]  zinc gluconate 50 MG tablet Take 50 mg by mouth daily.    [provider]      Allergies    Penicillins    Review of Systems   Review of Systems  Gastrointestinal:  Positive for diarrhea and vomiting.    Physical Exam Updated Vital Signs BP 129/83   Pulse 92   Temp 97.6 F (36.4 C) (Oral)   Resp 18   Ht '5\' 5"'$  (1.651 m)   Wt 66.9 kg   LMP  (LMP Unknown)   SpO2 96%   BMI 24.54 kg/m  Physical Exam Vitals and nursing note reviewed.  Cardiovascular:     Rate and Rhythm: Regular rhythm.  Pulmonary:     Breath sounds: No wheezing or rhonchi.  Abdominal:     Tenderness: There is no abdominal tenderness.  Musculoskeletal:        General: No tenderness.     Cervical back: Neck supple.  Skin:    Capillary Refill: Capillary refill takes less than 2 seconds.     Coloration: Skin is pale.  Neurological:     Mental Status: She is alert and oriented to person, place, and time.     ED Results / Procedures / Treatments   Labs (all labs ordered  are listed, but only abnormal results are displayed) Labs Reviewed  CBC WITH DIFFERENTIAL/PLATELET - Abnormal; Notable for the following components:      Result Value   RBC 3.49 (*)    Hemoglobin 11.4 (*)    HCT 35.2 (*)    MCV 100.9 (*)    Lymphs Abs 0.6 (*)    All other components within normal limits  COMPREHENSIVE METABOLIC PANEL - Abnormal; Notable for the following components:   Glucose, Bld 182 (*)    Calcium 8.6 (*)    Total Protein 6.3 (*)    Albumin 3.1 (*)    All other components within normal limits  PROTIME-INR - Abnormal; Notable for the following components:   Prothrombin Time 22.9 (*)    INR 2.1 (*)    All other components within normal limits  C DIFFICILE QUICK SCREEN W PCR REFLEX    LIPASE, BLOOD  URINALYSIS, ROUTINE W REFLEX MICROSCOPIC    EKG None  Radiology No results found.  Procedures Procedures    Medications Ordered in ED Medications  methylPREDNISolone sodium succinate (SOLU-MEDROL) 40 mg/mL injection 40 mg (has no administration in time range)  lactated ringers bolus 500 mL (500 mLs Intravenous New Bag/Given 01/10/23 1812)    ED Course/ Medical Decision Making/ A&P                             Medical Decision Making Risk Prescription drug management.   Patient with weakness.  Decreased oral intake.  Diarrhea.  Reportedly cannot get up and walk around.  Needed assistance to get from wheelchair to bed.  Will get basic blood work. Dr. Carlean Purl will see tomorrow in consult.  Recommends 40 mg of  Solu-Medrol IV a day.  Also recommend C. difficile testing.  Lab work overall reassuring.  Fluid bolus given.  Will discuss with hospitalist.  I have reviewed the GI note.         Final Clinical Impression(s) / ED Diagnoses Final diagnoses:  Dehydration  Chronic diarrhea    Rx / DC Orders ED Discharge Orders     None         Davonna Belling, MD 01/10/23 [phone]

## 2023-01-10 NOTE — ED Triage Notes (Signed)
Per family, patient is here for evaluation of diarrhea and vomiting X 1 year. States her doctor wants her admitted so she can have a colonoscopy. Family reports frequent and large bowel movements.

## 2023-01-10 NOTE — Assessment & Plan Note (Addendum)
Persistent watery diarrhea, more frequent recently.  Follow-up with [facility] who recommended admission for hydration and further workup with consideration of colonoscopy. -[facility] to consult -IV fluid hydration overnight -KUB -Hold Creon -Started on IV Solu-Medrol 40 mg daily per GI recommendation -Follow C. Difficile -Holding Eliquis (last dose 2/28 AM per family)  ADDENDUM: KUB showed possible colonic ileus versus distal colonic obstruction.  Follow-up CT A/P negative for obstruction but does show possible  Colitis changes involving the ascending colon.

## 2023-01-10 NOTE — Hospital Course (Addendum)
Findings:      Multiple diverticula were found in the left colon and right colon. No       SCAD.      Internal hemorrhoids were found during retroflexion. The hemorrhoids       were moderate. Prominent rectal veins, as well.      The exam was otherwise without abnormality on direct and retroflexion       views.      Biopsies for histology were taken with a cold forceps from the entire       colon for evaluation of microscopic colitis. Impression:               - Diverticulosis in the left colon and in the right                            colon.                           - Internal hemorrhoids.                           - The examination was otherwise normal on direct                            and retroflexion views.                           - Biopsies were taken with a cold forceps from the                            entire colon for evaluation of microscopic colitis. Moderate Sedation:      none Recommendation:           - Repeat colonoscopy is not recommended for                            surveillance.                           - Patient has a contact number available for                            emergencies. The signs and symptoms of potential                            delayed complications were discussed with the                            patient. Return to normal activities tomorrow.                            Written discharge instructions were provided to the                            patient.                           - Resume previous diet.                           -  Continue present medications. 

## 2023-01-10 NOTE — Assessment & Plan Note (Signed)
Continue rosuvastatin.  

## 2023-01-10 NOTE — Assessment & Plan Note (Signed)
TSH was elevated 43.641 on 12/13/2022.  This was in setting of acute illness and concern for medication compliance. -Continue current Synthroid dose and repeat TSH

## 2023-01-10 NOTE — Patient Instructions (Signed)
Your diarrhea is severe and you are now starting to have some nausea and vomiting.  I think the best approach is for you to go to the emergency department at [facility] and be admitted.  The gastroenterology service can see you there and perform more diagnostic testing to understand what is going on.  Please go to the emergency department and check-in.  Take this piece of paper with you.  Gatha Mayer, MD, [facility]al

## 2023-01-10 NOTE — ED Provider Triage Note (Signed)
Emergency Medicine Provider Triage Evaluation Note  [name] , a 89 y.o. female  was evaluated in triage.  Pt complains of severe diarrhea that has been worsening over the past year, is gotten significantly worse.  Was sent in for admission today by her doctor from [place].  They want to do further diagnostics including likely colonoscopy but do not feel it was safe for her to go home to continue outpatient workup.  Review of Systems  Positive: Nausea, diarrhea Negative: fever  Physical Exam  BP 105/77 (BP Location: Left Arm)   Pulse (!) 118   Temp 97.6 F (36.4 C) (Oral)   Resp 18   Ht '5\' 5"'$  (1.651 m)   Wt 66.9 kg   LMP  (LMP Unknown)   SpO2 94%   BMI 24.54 kg/m  Gen:   Awake, no distress   Resp:  Normal effort  MSK:   Moves extremities without difficulty  Other:    Medical Decision Making  Medically screening exam initiated at 3:31 PM.  Appropriate orders placed.  [name] was informed that the remainder of the evaluation will be completed by another provider, this initial triage assessment does not replace that evaluation, and the importance of remaining in the ED until their evaluation is complete.     Gwenevere Abbot, [place] 01/10/23 [phone]

## 2023-01-10 NOTE — Assessment & Plan Note (Signed)
Continue sertraline and Ativan.

## 2023-01-11 ENCOUNTER — Observation Stay (HOSPITAL_COMMUNITY): Payer: Medicare PPO

## 2023-01-11 DIAGNOSIS — Z7901 Long term (current) use of anticoagulants: Secondary | ICD-10-CM | POA: Diagnosis not present

## 2023-01-11 DIAGNOSIS — I472 Ventricular tachycardia, unspecified: Secondary | ICD-10-CM | POA: Diagnosis present

## 2023-01-11 DIAGNOSIS — R54 Age-related physical debility: Secondary | ICD-10-CM | POA: Diagnosis present

## 2023-01-11 DIAGNOSIS — K648 Other hemorrhoids: Secondary | ICD-10-CM | POA: Diagnosis not present

## 2023-01-11 DIAGNOSIS — K52839 Microscopic colitis, unspecified: Secondary | ICD-10-CM | POA: Diagnosis present

## 2023-01-11 DIAGNOSIS — E876 Hypokalemia: Secondary | ICD-10-CM | POA: Diagnosis present

## 2023-01-11 DIAGNOSIS — E890 Postprocedural hypothyroidism: Secondary | ICD-10-CM | POA: Diagnosis present

## 2023-01-11 DIAGNOSIS — I340 Nonrheumatic mitral (valve) insufficiency: Secondary | ICD-10-CM | POA: Diagnosis not present

## 2023-01-11 DIAGNOSIS — H9190 Unspecified hearing loss, unspecified ear: Secondary | ICD-10-CM | POA: Diagnosis present

## 2023-01-11 DIAGNOSIS — K2100 Gastro-esophageal reflux disease with esophagitis, without bleeding: Secondary | ICD-10-CM | POA: Diagnosis present

## 2023-01-11 DIAGNOSIS — Z79899 Other long term (current) drug therapy: Secondary | ICD-10-CM | POA: Diagnosis not present

## 2023-01-11 DIAGNOSIS — E860 Dehydration: Secondary | ICD-10-CM | POA: Diagnosis present

## 2023-01-11 DIAGNOSIS — F419 Anxiety disorder, unspecified: Secondary | ICD-10-CM | POA: Diagnosis present

## 2023-01-11 DIAGNOSIS — I4821 Permanent atrial fibrillation: Secondary | ICD-10-CM | POA: Diagnosis present

## 2023-01-11 DIAGNOSIS — F32A Depression, unspecified: Secondary | ICD-10-CM | POA: Diagnosis present

## 2023-01-11 DIAGNOSIS — K5730 Diverticulosis of large intestine without perforation or abscess without bleeding: Secondary | ICD-10-CM | POA: Diagnosis present

## 2023-01-11 DIAGNOSIS — I10 Essential (primary) hypertension: Secondary | ICD-10-CM | POA: Diagnosis not present

## 2023-01-11 DIAGNOSIS — I420 Dilated cardiomyopathy: Secondary | ICD-10-CM | POA: Diagnosis present

## 2023-01-11 DIAGNOSIS — I4891 Unspecified atrial fibrillation: Secondary | ICD-10-CM | POA: Diagnosis not present

## 2023-01-11 DIAGNOSIS — I4819 Other persistent atrial fibrillation: Secondary | ICD-10-CM | POA: Diagnosis not present

## 2023-01-11 DIAGNOSIS — H47019 Ischemic optic neuropathy, unspecified eye: Secondary | ICD-10-CM | POA: Diagnosis present

## 2023-01-11 DIAGNOSIS — I482 Chronic atrial fibrillation, unspecified: Secondary | ICD-10-CM | POA: Diagnosis not present

## 2023-01-11 DIAGNOSIS — K529 Noninfective gastroenteritis and colitis, unspecified: Secondary | ICD-10-CM | POA: Diagnosis not present

## 2023-01-11 DIAGNOSIS — D539 Nutritional anemia, unspecified: Secondary | ICD-10-CM | POA: Diagnosis present

## 2023-01-11 DIAGNOSIS — I5033 Acute on chronic diastolic (congestive) heart failure: Secondary | ICD-10-CM | POA: Diagnosis present

## 2023-01-11 DIAGNOSIS — E1142 Type 2 diabetes mellitus with diabetic polyneuropathy: Secondary | ICD-10-CM | POA: Diagnosis present

## 2023-01-11 DIAGNOSIS — I110 Hypertensive heart disease with heart failure: Secondary | ICD-10-CM | POA: Diagnosis present

## 2023-01-11 DIAGNOSIS — R197 Diarrhea, unspecified: Secondary | ICD-10-CM | POA: Diagnosis present

## 2023-01-11 DIAGNOSIS — G4733 Obstructive sleep apnea (adult) (pediatric): Secondary | ICD-10-CM | POA: Diagnosis present

## 2023-01-11 DIAGNOSIS — E1169 Type 2 diabetes mellitus with other specified complication: Secondary | ICD-10-CM | POA: Diagnosis present

## 2023-01-11 DIAGNOSIS — E7800 Pure hypercholesterolemia, unspecified: Secondary | ICD-10-CM | POA: Diagnosis present

## 2023-01-11 DIAGNOSIS — J9601 Acute respiratory failure with hypoxia: Secondary | ICD-10-CM | POA: Diagnosis present

## 2023-01-11 LAB — GLUCOSE, CAPILLARY
Glucose-Capillary: 153 mg/dL — ABNORMAL HIGH (ref 70–99)
Glucose-Capillary: 96 mg/dL (ref 70–99)
Glucose-Capillary: 180 mg/dL — ABNORMAL HIGH (ref 70–99)

## 2023-01-11 LAB — BASIC METABOLIC PANEL
Anion gap: 8 (ref 5–15)
BUN: 16 mg/dL (ref 8–23)
CO2: 26 mmol/L (ref 22–32)
Calcium: 8.3 mg/dL — ABNORMAL LOW (ref 8.9–10.3)
Chloride: 100 mmol/L (ref 98–111)
Creatinine, Ser: 0.63 mg/dL (ref 0.44–1.00)
GFR, Estimated: >60 mL/min (ref >60)
Glucose, Bld: 152 mg/dL — ABNORMAL HIGH (ref 70–99)
Potassium: 3.2 mmol/L — ABNORMAL LOW (ref 3.5–5.1)
Sodium: 134 mmol/L — ABNORMAL LOW (ref 135–145)

## 2023-01-11 LAB — CBC
HCT: 32.9 % — ABNORMAL LOW (ref 36.0–46.0)
Hemoglobin: 10.8 g/dL — ABNORMAL LOW (ref 12.0–15.0)
MCH: 33.1 pg (ref 26.0–34.0)
MCHC: 32.8 g/dL (ref 30.0–36.0)
MCV: 100.9 fL — ABNORMAL HIGH (ref 80.0–100.0)
Platelets: 213 10*3/uL (ref 150–400)
RBC: 3.26 MIL/uL — ABNORMAL LOW (ref 3.87–5.11)
RDW: 13.3 % (ref 11.5–15.5)
WBC: 5.5 10*3/uL (ref 4.0–10.5)
nRBC: 0 % (ref 0.0–0.2)

## 2023-01-11 LAB — LACTIC ACID, PLASMA
Lactic Acid, Venous: 1.2 mmol/L (ref 0.5–1.9)
Lactic Acid, Venous: 1.3 mmol/L (ref 0.5–1.9)

## 2023-01-11 LAB — TROPONIN I (HIGH SENSITIVITY): Troponin I (High Sensitivity): 7 ng/L (ref <18)

## 2023-01-11 LAB — BRAIN NATRIURETIC PEPTIDE: B Natriuretic Peptide: 468.9 pg/mL — ABNORMAL HIGH (ref 0.0–100.0)

## 2023-01-11 LAB — TSH: TSH: 1.046 u[IU]/mL (ref 0.350–4.500)

## 2023-01-11 MED ORDER — FUROSEMIDE 10 MG/ML IJ SOLN
40.0000 mg | Freq: Once | INTRAMUSCULAR | Status: AC
  Administered 2023-01-11: 40 mg via INTRAVENOUS
  Filled 2023-01-11: qty 4

## 2023-01-11 MED ORDER — LACTATED RINGERS IV SOLN: INTRAVENOUS | Status: DC

## 2023-01-11 MED ORDER — PANTOPRAZOLE SODIUM 40 MG PO TBEC
40.0000 mg | DELAYED_RELEASE_TABLET | Freq: Every day | ORAL | Status: DC
  Administered 2023-01-12 – 2023-01-16 (×4): 40 mg via ORAL
  Filled 2023-01-11 (×4): qty 1

## 2023-01-11 MED ORDER — FUROSEMIDE 40 MG PO TABS
40.0000 mg | ORAL_TABLET | Freq: Once | ORAL | Status: AC
  Administered 2023-01-11: 40 mg via ORAL
  Filled 2023-01-11: qty 1

## 2023-01-11 MED ORDER — POTASSIUM CHLORIDE 10 MEQ/100ML IV SOLN
10.0000 meq | INTRAVENOUS | Status: AC
  Administered 2023-01-11 (×4): 10 meq via INTRAVENOUS
  Filled 2023-01-11 (×4): qty 100

## 2023-01-11 MED ORDER — METOPROLOL TARTRATE 12.5 MG HALF TABLET
12.5000 mg | ORAL_TABLET | Freq: Once | ORAL | Status: AC
  Administered 2023-01-11: 12.5 mg via ORAL
  Filled 2023-01-11: qty 1

## 2023-01-11 MED ORDER — METOPROLOL TARTRATE 5 MG/5ML IV SOLN
5.0000 mg | Freq: Four times a day (QID) | INTRAVENOUS | Status: DC | PRN
  Administered 2023-01-15: 5 mg via INTRAVENOUS
  Filled 2023-01-11: qty 5

## 2023-01-11 MED ORDER — METOPROLOL TARTRATE 50 MG PO TABS
100.0000 mg | ORAL_TABLET | Freq: Two times a day (BID) | ORAL | Status: DC
  Administered 2023-01-11 – 2023-01-16 (×10): 100 mg via ORAL
  Filled 2023-01-11 (×10): qty 2

## 2023-01-11 NOTE — TOC Initial Note (Signed)
Transition of Care [facility]) - Initial/Assessment Note    Patient Details  Name: [name] MRN: [id number] Date of Birth: [birth year]  Transition of [facility]) CM/SW Contact:    Ninfa Meeker, RN Phone Number: 01/11/2023, 2:58 PM  Clinical Narrative:                 Patient is a 89 yr old female who presented to the ED per her GI specialist recommendation for further evaluation and management of generalized weakness associated with persistent diarrhea.   Patient is sleeping, Case Manager spoke with daughter, Shirlean Mylar. Discussed recommendation for Home Health therapy, Shirlean Mylar states that patient is already active with [facility], has RN, PT/OT that comes out. They also have 24/7 caregivers through [facility]. Patient uses a walker at home per Robin. Case Manager contacted Adela Lank, [facility] Liaison to confirm and update regarding hospitalization. TOC Team will continue to follow.  Expected Discharge Plan: [place] Barriers to Discharge: Continued Medical Work up   Patient Goals and CMS Choice     Choice offered to / list presented to : Adult Children (Daughter; Shirlean Mylar)      Expected Discharge Plan and Services   Discharge Planning Services: CM Consult Post Acute Care Choice: Home Health, Resumption of Svcs/PTA Provider                   DME Arranged: N/A         HH Arranged: RN, OT, PT [place] Agency: [facility]        Prior Living Arrangements/Services   Lives with:: Other (Comment) (has 24/7 care givers)          Need for Family Participation in Patient Care: Yes (Comment) Care giver support system in place?: Yes (comment)      Activities of Daily Living   ADL Screening (condition at time of admission) Is the patient deaf or have difficulty hearing?: Yes Does the patient have difficulty seeing, even when wearing glasses/contacts?: No  Permission Sought/Granted                  Emotional Assessment          Alcohol / Substance Use: Not Applicable Psych Involvement: No (comment)  Admission diagnosis:  Diarrhea [R19.7] Dehydration [E86.0] Chronic diarrhea [K52.9] Patient Active Problem List   Diagnosis Date Noted   Generalized weakness 01/10/2023   Depression with anxiety 01/10/2023   Pleural effusion on right 01/10/2023   Lesion of left native kidney 01/10/2023   Physical deconditioning 12/13/2022   Hypokalemia 12/13/2022   Diarrhea 12/13/2022   Fall 12/12/2022   Atypical atrial flutter ([place]) 06/13/2021   Secondary hypercoagulable state ([place]) 06/13/2021   A-fib ([place])    Dilated cardiomyopathy ([place]) 08/27/2018   Acute bronchitis 11/15/2017   S/P Nissen fundoplication (without gastrostomy tube) procedure 05/25/2017   Persistent atrial fibrillation ([place])    Chronic cough 01/02/2017   Lumbar stenosis with neurogenic claudication 04/20/2016   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   Malignant neoplasm of breast ([place]) 02/25/2016   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   Post-surgical hypothyroidism 04/09/2014   Anticoagulated by anticoagulation treatment - Eliquis 03/30/2014   DMII (diabetes mellitus, type 2) ([place])    Heart murmur    DJD (degenerative joint disease) of knee    Glaucoma    History of laser assisted in situ keratomileusis 07/25/2012   Degenerative disorder of eye 07/25/2012   Exotropia, intermittent 07/09/2012   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   Error, refractive, myopia 07/09/2012   ANEMIA, IRON DEFICIENCY 10/14/2010   GERD 10/14/2010   THYROID CANCER 10/13/2010   Hyperlipidemia associated with type 2 diabetes mellitus ([place]) 10/13/2010   ANEMIA, UNSPECIFIED 10/13/2010   Anxiety state 10/13/2010   MIGRAINE HEADACHE 10/13/2010   Essential hypertension 10/13/2010   EROSIVE ESOPHAGITIS 10/13/2010   BARRETTS ESOPHAGUS 10/13/2010   Hiatal hernia s/p robotic repair & fundoplication [id number]   DIVERTICULOSIS, COLON 10/13/2010   Irritable bowel  syndrome 10/13/2010   OSTEOARTHRITIS 10/13/2010   CARDIAC MURMUR 10/13/2010   COLONIC POLYPS, ADENOMATOUS, HX OF 10/13/2010   PCP:  Corliss Blacker, MD Pharmacy:   [facility] [id number] [place] [address] Phone: [phone] Fax: [phone]  [facility] [place], [place] [address] Phone: [phone] Fax: [phone]  [id number] [place] [id number]: [phone]: [phone]    Social Determinants of Health (SDOH) Social History: SDOH Screenings   Tobacco Use: Low Risk  (01/10/2023)   SDOH Interventions:     Readmission Risk Interventions     No data to display

## 2023-01-11 NOTE — Progress Notes (Addendum)
PROGRESS NOTE   [name]  [id number] DOB: [birth year] DOA: 01/10/2023 PCP: Corliss Blacker, MD  Brief Narrative:  69 wf Perm AFIB (since 2016) Chad2vasc2>4 [failed tikosyn/amiodarone--DCCV several times]--on BB --see OV note 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 Carles Collet in 2019 L4-L5 lumbar decompression in 2017 by neurosurgeon Dr. Sherwood Gambler  Recent Hospitalized 1/29-->12/14/22 Fall-stools up to 5 times a day with outpatient workup subsequently showing positive lactoferrin suggestive of inflammatory disorders noninfective Metformin was discontinued secondary to the diarrhea as there was a concern for this being a class effect of the metformin  presented from Dr. Carlean Purl office visit 2/20 as per his note with subacute presentation of diarrhea several times a day (up to 6-explosive-watery) Had been on Creon with no improvement Caregiver who has been with her since last hospitalization describes cough for the past 96 hours dry No blood in stool  Bolus IVF in ED Dr. Lorenso Courier GI consulted ---gave IV Solu-Medrol Overnight 2/29 patient became more tachycardic dyspneic and developed new oxygen requirement  Hospital-Problem based course  Likely acute hypoxic respiratory failure from [place]  -IVF discontinued completely - Lasix 40 mg IV X1 -Cardiology consulted given difficult to control A-fib, likely tachycardia mediated CHF ETEC -Watch blood pressures-if heart rate becomes uncontrolled, might initiate amiodarone -Await lactic acid, procalcitonin, BNP, troponin-need to differentiate this from aspiration (signs may be subtle in the elderly for aspiration)  Colitis, prior concern for GERD Barrett's esophagus etc. - Per GI, steroids on board Solu-Medrol daily 40  mg  Hypokalemia -Klor40 mEq twice daily, recheck a.m. as below  A-fib CHADVASC >5 last Eliquis dose 8 AM 2/28 - Check a.m. magnesium -Case reviewed with Dr. Wyatt Haste continuing metoprolol 100 twice daily -If heart rate becomes uncontrolled will amiodarone load without bolus as per our discussion -If she stays in A-fib we may need to initiate heparin GTT versus Lovenox treatment dosing -Obtain INR in a.m.  ?Thyroid cancer? -Looks like she saw Dr. Ralene Ok in the remote past circa 2009 -TSH 4 weeks ago was about 62 and today is 1.04 -She will continue Synthroid 150 every morning  DVT prophylaxis: Heparin prophylactic Code Status: Full confirmed with patient's daughter at the bedside Robin Family Communication: As above Disposition:  Status is: Observation The patient remains OBS appropriate and will d/c before 2 midnights.   Subjective: Events overnight noted slightly short of breath on oxygen no seems somewhat improved after Lasix No chest pain Quite hard of hearing No fever She is able to give me some history and does not seem to have any issues and has not had a diarrhea since admission  Objective: Vitals:   01/10/23 2127 01/10/23 2330 01/11/23 0330 01/11/23 0748  BP: (!) 136/99 (!) 145/100 (!) 138/104 (!) 126/104  Pulse: (!) 101 96 (!) 103 (!) 104  Resp: (!) 32 (!) 26 20 (!) 21  Temp: (!) 97.5 F (36.4 C) 97.6 F (36.4 C) 98 F (36.7 C)   TempSrc: Oral Oral Oral   SpO2: 95% 94% 98% 99%  Weight:      Height:        Intake/Output Summary (Last 24 hours) at 01/11/2023 0919 Last data filed at 01/11/2023 0908 Gross per 24 hour  Intake --  Output 2 ml  Net -2 ml   Filed Weights   01/10/23 1529  Weight: 66.9 kg    Examination:  EOMI NCAT no focal deficit mildly slow responses to questions Chest seems bilaterally diminished more so on the right side posterolaterally S1-S2 and rate controlled A-fib but does have runs of sinus tachycardia NSVT up to about  30 Abdomen is soft no rebound no guarding No lower extremity edema   Data Reviewed: personally reviewed   CBC    Component Value Date/Time   WBC 5.5 01/11/2023 0540   RBC 3.26 (L) 01/11/2023 0540   HGB 10.8 (L) 01/11/2023 0540   HGB 13.3 01/10/2021 1133   HGB 12.6 04/14/2009 1107   HCT 32.9 (L) 01/11/2023 0540   HCT 39.2 01/10/2021 1133   HCT 36.9 04/14/2009 1107   PLT 213 01/11/2023 0540   PLT 193 01/10/2021 1133   MCV 100.9 (H) 01/11/2023 0540   MCV 93 01/10/2021 1133   MCV 89.5 04/14/2009 1107   MCH 33.1 01/11/2023 0540   MCHC 32.8 01/11/2023 0540   RDW 13.3 01/11/2023 0540   RDW 13.2 01/10/2021 1133   RDW 13.4 04/14/2009 1107   LYMPHSABS 0.6 (L) 01/10/2023 1704   LYMPHSABS 1.3 01/10/2021 1133   LYMPHSABS 1.0 04/14/2009 1107   MONOABS 0.2 01/10/2023 1704   MONOABS 0.4 04/14/2009 1107   EOSABS 0.0 01/10/2023 1704   EOSABS 0.3 01/10/2021 1133   BASOSABS 0.0 01/10/2023 1704   BASOSABS 0.0 01/10/2021 1133   BASOSABS 0.0 04/14/2009 1107      Latest Ref Rng & Units 01/11/2023    5:40 AM 01/10/2023    5:04 PM 12/29/2022    2:21 PM  CMP  Glucose 70 - 99 mg/dL 152  182  157   BUN 8 - 23 mg/dL '16  14  19   '$ Creatinine 0.44 - 1.00 mg/dL 0.63  0.85  0.64   Sodium 135 - 145 mmol/L 134  136  138   Potassium 3.5 - 5.1 mmol/L 3.2  3.8  3.5   Chloride 98 - 111 mmol/L 100  100  100   CO2 22 - 32 mmol/L '26  27  29   '$ Calcium 8.9 - 10.3 mg/dL 8.3  8.6  9.0   Total Protein 6.5 - 8.1 g/dL  6.3  6.6   Total Bilirubin 0.3 - 1.2 mg/dL  1.2  0.5   Alkaline Phos 38 - 126 U/L  55  60   AST 15 - 41 U/L  29  15   ALT 0 - 44 U/L  14  13      Radiology Studies: DG CHEST PORT 1 VIEW  Result Date: 01/11/2023 CLINICAL DATA:  Pleural effusion EXAM: PORTABLE CHEST 1 VIEW COMPARISON:  12/07/2022 FINDINGS: Prominent cardiac silhouette. Small pleural effusion on the left and moderate pleural effusion right. Pulmonary vascular congestion consistent with pulmonary edema. Mild bibasilar  consolidation. Calcified tortuous aorta. Postop changes right axilla. IMPRESSION: Findings consistent with CHF. Moderate pleural effusion on the right and small pleural effusion on the left. Electronically Signed   By: Sammie Bench M.D.   On: 01/11/2023 09:06   CT ABDOMEN PELVIS W CONTRAST  Result Date: 01/10/2023 CLINICAL DATA:  Bowel obstruction suspected Diarrhea and generalized weakness. EXAM: CT ABDOMEN AND PELVIS WITH CONTRAST TECHNIQUE: Multidetector CT imaging of the abdomen and pelvis was performed using the standard protocol following bolus administration of intravenous contrast. RADIATION DOSE REDUCTION: This exam was performed according to the departmental dose-optimization program which includes automated exposure control, adjustment of the mA and/or kV according to patient size  and/or use of iterative reconstruction technique. CONTRAST:  23m OMNIPAQUE IOHEXOL 300 MG/ML  SOLN COMPARISON:  CT 03/23/2022 FINDINGS: Lower chest: Moderate right and small left pleural effusion. Associated compressive atelectasis. Cardiomegaly with primarily right heart dilatation. Wall thickening of the distal esophagus with small hiatal hernia. Hepatobiliary: Stable small low-density lesions are too small to accurately characterize but likely cysts. No new liver lesions. Layering gallstones in the gallbladder. No pericholecystic inflammation. No biliary dilatation. Pancreas: Mild fatty atrophy.  No ductal dilatation or inflammation. Spleen: Normal in size without focal abnormality. Adrenals/Urinary Tract: No adrenal nodule. Symmetric prominence of both renal collecting systems without ureteral dilatation. There are bilateral simple cysts that need no further follow-up. Indeterminate exophytic lesion from the upper pole of the left kidney measures 12 mm, series 2, image 23, previously 9 mm. Partially distended urinary bladder. Stomach/Bowel: Moderate-sized hiatal hernia with wall thickening of the distal esophagus.  Air in fluid within the stomach without gastric wall thickening. No small bowel obstruction or inflammatory change. Minimal fluid within distal small bowel loops in the pelvis. The appendix is not confidently seen. Mild wall thickening of the ascending colon, series 2, image 55. Formed stool in the remainder of the ascending colon. Air-filled transverse colon that is mildly prominent. Mixed liquid and solid stool in the descending colon. Small amount of formed stool in the sigmoid colon. Mild sigmoid colonic diverticulosis without diverticulitis. Rectum is distended with air and a small amount of mixed liquid and solid stool. No obvious colonic mass. Vascular/Lymphatic: Aortic atherosclerosis and tortuosity. No aneurysm. No acute vascular findings. No adenopathy. Reproductive: Hysterectomy. No adnexal mass. Other: No ascites or free fluid. No free intra-abdominal air. There is generalized subcutaneous body wall edema. Fat in both inguinal canals. Musculoskeletal: Posterior L4-L5 fusion hardware. Degenerative change of both hips and the pubic symphysis. There are no acute or suspicious osseous abnormalities. IMPRESSION: 1. Mild wall thickening of the ascending colon, possible colitis. 2. Moderate-sized hiatal hernia with wall thickening of the distal esophagus, can be seen with reflux or esophagitis. 3. Moderate right and small left pleural effusions with compressive atelectasis. Generalized subcutaneous body wall edema. 4. Cholelithiasis without CT findings of acute cholecystitis. 5. Indeterminate exophytic lesion from the upper pole of the left kidney measures 12 mm, previously 9 mm. Recommend nonemergent renal protocol MRI for characterization after resolution of acute event, preferably as an outpatient when patient can tolerate breath hold technique. 6. Mild sigmoid colonic diverticulosis without diverticulitis. Aortic Atherosclerosis (ICD10-I70.0). Electronically Signed   By: MKeith RakeM.D.   On:  01/10/2023 21:15   Abd 1 View (KUB)  Result Date: 01/10/2023 CLINICAL DATA:  Chronic diarrhea. EXAM: ABDOMEN - 1 VIEW COMPARISON:  None Available. FINDINGS: Generalized gaseous distention of colon is seen without significant small bowel dilatation. Lumbar spine fusion hardware noted. IMPRESSION: Generalized gaseous distention of colon. Differential diagnosis includes colonic ileus and distal colonic obstruction. Consider abdomen pelvis CT for further evaluation if clinically warranted. Electronically Signed   By: JMarlaine HindM.D.   On: 01/10/2023 19:53     Scheduled Meds:  heparin  5,000 Units Subcutaneous Q8H   insulin aspart  0-9 Units Subcutaneous TID WC   levothyroxine  150 mcg Oral q morning   LORazepam  0.5 mg Oral QHS   methylPREDNISolone (SOLU-MEDROL) injection  40 mg Intravenous Daily   metoprolol tartrate  100 mg Oral BID   rosuvastatin  10 mg Oral QHS   sertraline  50 mg Oral Daily   sodium chloride  flush  3 mL Intravenous Q12H   Continuous Infusions:  potassium chloride       LOS: 0 days   Time spent: [place], MD [facility] To contact the attending provider between 7A-7P or the covering provider during after hours 7P-7A, please log into the web site [email or web address] and access using universal [place] password for that web site. If you do not have the password, please call the hospital operator.  01/11/2023, 9:19 AM

## 2023-01-11 NOTE — Consult Note (Addendum)
Referring Provider: [name]  Primary Care Physician:  Corliss Blacker, MD Primary Gastroenterologist:  [name]   Reason for Consultation:  Diarrhea, suspected colitis/IBD  HPI: [name] is a 89 y.o. female with a past medical history of anxiety, thyroid cancer, breast cancer, cervical cancer, hypertension, hyperlipidemia, atrial fibrillation on Eliquis, thyroidism, IDA, chronic diarrhea, GERD, Barrett's esophagus and colon polyps. S/P hiatal hernia repair and Nissen fundoplication [id number].   She was seen by Dr. Carlean Purl in our outpatient GI clinic yesterday for follow-up regarding worsening chronic diarrhea.  Laboratory studies 01/01/2023 showed a negative GI pathogen panel including negative C. difficile toxin A/B, negative O&P, positive fecal lactoferrin level and fecal calprotectin 587. During yesterday's appointment, she appeared frail and weak therefore she was sent to the ED for admission with GI consult to include a colonoscopy to rule out IBD.  Repeat C. difficile PCR was ordered, not yet collected.  Labs in the ED showed a WBC count of 5.3.  Hemoglobin 11.4 which is close to her baseline.  Hematocrit 35.2.  MCV 100.9.  BUN 14.  Creatinine 0.85.  Albumin 3.1.  Lipase 35.  Normal electrolytes and LFTs.  INR 2.1 on Eliquis, last dose was 8am on Wed 01/10/2023.  CTAP showed mild wall thickening to the ascending colon without evidence of an obstruction.  Labs today: WBC 5.5.  Hemoglobin 10.8.  Sodium 134.  Potassium 3.2.  Calcium 8.3.  TSH 1.046.  Her oxygen saturation level dropped to 82% on 3 L last night when she transferred from the bed to the bedside commode. Her respiration rate was reported in the 30s.  Afib with HR 100's. A chest x-ray was done this morning which showed pulmonary edema with a moderate pleural effusion on the right and a small pleural effusion on the left.   Currently, she is on oxygen 3 L nasal cannula in no respiratory distress.  She denies  having any chest pain or shortness of breath.  Her health aide [name] is at the bedside. No BM thus far today. She denies having any abdominal pain.  No nausea or vomiting.  [name] stated the patient typically passes 4-8 nonbloody brown loose to watery bowel movements daily at home.  She's had loose stools for the past year.  Prior pathogen panel, C. difficile testing and celiac serology 03/2022 were negative.  Pancreatic elastase level was low and she was prescribed Creon without improvement. Her most recent colonoscopy was 03/04/2008 which showed severe diverticulosis, no polyps or evidence of colitis.  GI Procedures:   EGD 06/09/2015: 1) 3 columnar tongues seen at GE junction, all < 1 cm. No nodules/ulcers. Biosies taken.  2) Hiatal hernia 35-40 cm  3) Moderate non-erosive gastritis -No recall EGD due to age - INFLAMED SQUAMOCOLUMNAR MUCOSA WITH INTESTINAL METAPLASIA. - NO DYSPLASIA OR MALIGNANCY IDENTIFIED. - SEE COMMENT.  Colonoscopy 03/04/2008: Diverticulosis to the descending and sigmoid colon.  Nonbleeding.  Severe tics with thickened red haustral folds.  Colonoscopy 01/18/2005 identified one 8 mm adenomatous polyp removed from the cecum and diverticulosis.  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   Allergy    SEASONAL   Anemia    years ago   Anticoagulated by anticoagulation treatment - Eliquis 03/30/2014   Started May 2015  Anxiety state 10/13/2010   Qualifier: Diagnosis of  By: Nils Pyle CMA (AAMA), Mearl Latin     Arthritis    "all my joints" (03/31/2014)   Barrett's esophagus    Breast cancer ([place])    s/p right breast lumpectomy   CARDIAC MURMUR 10/13/2010   Qualifier: Diagnosis of  By: Nils Pyle CMA (AAMA), Leisha     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   Cervical cancer ([place])     Chronic cough 01/02/2017   GERD, hiatal hernia Doubt amio   COLONIC POLYPS, ADENOMATOUS, HX OF 10/13/2010   Qualifier: Diagnosis of  By: Nils Pyle CMA (AAMA), Leisha     Degenerative disorder of eye 07/25/2012   Diabetes mellitus without complication ([place])    Dilated cardiomyopathy ([place]) 08/27/2018   DIVERTICULOSIS, COLON 10/13/2010   Qualifier: Diagnosis of  By: Nils Pyle CMA (AAMA), Leisha     DJD (degenerative joint disease) of knee    Dysrhythmia    Afib   Echocardiogram 07/2020    Echo 9/21: EF 70-75, moderate LVH, GR 1 DD, RVSP 40.5 (mildly elevated), normal RVSF, moderate LAE, mild RAE, mild MR, trivial AI   EROSIVE ESOPHAGITIS 10/13/2010   Qualifier: Diagnosis of  By: Nils Pyle CMA (AAMA), Leisha     Error, refractive, myopia 07/09/2012   Essential hypertension 10/13/2010   Qualifier: Diagnosis of  By: Nils Pyle CMA (AAMA), Rueben Bash, intermittent 07/09/2012   GERD 10/14/2010   Qualifier: Diagnosis of  By: Sharlett Iles MD Cline Cools R    GERD (gastroesophageal reflux disease)    Glaucoma    H/O hiatal hernia    Heart murmur    Hiatal hernia s/p robotic repair & fundoplication [id number]   Qualifier: Diagnosis of  By: Nils Pyle CMA (AAMA), Mearl Latin     History of laser assisted in situ keratomileusis 07/25/2012   Hx of transesophageal echocardiography (TEE) for monitoring    TEE (03/2014): No LAA clot, moderate LAE, core triatriatum type structure in RA with no stenosis, normal EF 55-60%, mild MR   HYPERCHOLESTEROLEMIA 10/13/2010   Qualifier: Diagnosis of  By: Nils Pyle CMA (AAMA), Leisha     Hypertension    Hypothyroidism, postsurgical    Iron Deficiency Anemia 10/14/2010   Qualifier: Diagnosis of  By: Sharlett Iles MD Cline Cools R    Irritable bowel syndrome 10/13/2010   Qualifier: Diagnosis of  By: Nils Pyle CMA (AAMA), Mearl Latin     Low serum potassium    Lumbar stenosis with neurogenic claudication 04/20/2016   Malignant neoplasm of breast ([place]) 02/25/2016   Migraine     "last one was years ago" (03/31/2014)   MIGRAINE HEADACHE 10/13/2010   Qualifier: Diagnosis of  By: Nils Pyle CMA (AAMA), Leisha     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   Overview:  X(T)    OSA on CPAP    settings at 10-11    OSTEOARTHRITIS 10/13/2010   Qualifier: Diagnosis of  By: Nils Pyle CMA (AAMA), Leisha     Persistent atrial fibrillation 03/31/2015   DLCO dropped from 100% in 2015-50% in 01/2017   Post-surgical hypothyroidism 04/09/2014   RECTAL FISSURE 10/13/2010   Qualifier: Diagnosis of  By: Nils Pyle CMA (AAMA), Mearl Latin     S/P Nissen fundoplication (without gastrostomy tube) procedure 05/25/2017   Sleep apnea    THYROID CANCER 10/13/2010   Qualifier: Diagnosis of  By: Nils Pyle CMA Deborra Medina), Mearl Latin     Thyroid cancer [facility])    s/p thyroidectomy   Type II diabetes mellitus ([place])    type II     Past  Surgical History:  Procedure Laterality Date   ABDOMINAL HYSTERECTOMY     "partial"   BREAST BIOPSY Right    BREAST LUMPECTOMY Right    CARDIOVERSION N/A 03/30/2014   Procedure: CARDIOVERSION - BEDSIDE;  Surgeon: [name], MD;  Location: [place];  Service: Cardiovascular;  Laterality: N/A;   CARDIOVERSION N/A 03/31/2014   Procedure: CARDIOVERSION  ([id number]) ;  Surgeon: Lelon Perla, MD;  Location: [place];  Service: Cardiovascular;  Laterality: N/A;   CARDIOVERSION N/A 04/27/2014   Procedure: CARDIOVERSION;  Surgeon: Darlin Coco, MD;  Location: [place];  Service: Cardiovascular;  Laterality: N/A;   CARDIOVERSION N/A 04/13/2015   Procedure: CARDIOVERSION;  Surgeon: Thayer Headings, MD;  Location: [place];  Service: Cardiovascular;  Laterality: N/A;   CARDIOVERSION N/A 01/08/2017   Procedure: CARDIOVERSION;  Surgeon: Sanda Klein, MD;  Location: [facility] ENDOSCOPY;  Service: Cardiovascular;  Laterality: N/A;   CARDIOVERSION N/A 09/19/2017   Procedure: CARDIOVERSION;  Surgeon: [name], MD;  Location: [place];  Service: Cardiovascular;  Laterality: N/A;   CARDIOVERSION N/A  10/25/2017   Procedure: CARDIOVERSION;  Surgeon: Nahser, Wonda Cheng, MD;  Location: [place];  Service: Cardiovascular;  Laterality: N/A;   CARDIOVERSION N/A 12/27/2018   Procedure: CARDIOVERSION;  Surgeon: [name], MD;  Location: [place];  Service: Cardiovascular;  Laterality: N/A;   CARDIOVERSION N/A 05/20/2019   Procedure: CARDIOVERSION;  Surgeon: [name], MD;  Location: [place];  Service: Cardiovascular;  Laterality: N/A;   CARDIOVERSION N/A 12/10/2019   Procedure: CARDIOVERSION;  Surgeon: Pixie Casino, MD;  Location: [place];  Service: Cardiovascular;  Laterality: N/A;   CARDIOVERSION N/A 12/31/2019   Procedure: CARDIOVERSION;  Surgeon: Sanda Klein, MD;  Location: [place];  Service: Cardiovascular;  Laterality: N/A;   CARDIOVERSION N/A 10/21/2020   Procedure: CARDIOVERSION;  Surgeon: [name], MD;  Location: [place];  Service: Cardiovascular;  Laterality: N/A;   CARDIOVERSION N/A 12/03/2020   Procedure: CARDIOVERSION;  Surgeon: Jerline Pain, MD;  Location: [place];  Service: Cardiovascular;  Laterality: N/A;   CARDIOVERSION N/A 07/11/2021   Procedure: CARDIOVERSION;  Surgeon: Pixie Casino, MD;  Location: [facility] ENDOSCOPY;  Service: Cardiovascular;  Laterality: N/A;   CARPAL TUNNEL RELEASE Right    CATARACT EXTRACTION W/ INTRAOCULAR LENS  IMPLANT, BILATERAL Bilateral    COLONOSCOPY     EXCISIONAL HEMORRHOIDECTOMY     INSERTION OF MESH N/A 05/25/2017   Procedure: INSERTION OF MESH;  Surgeon: Ralene Ok, MD;  Location: [facility];  Service: General;  Laterality: N/A;   TEE WITHOUT CARDIOVERSION N/A 03/30/2014   Procedure: TRANSESOPHAGEAL ECHOCARDIOGRAM (TEE);  Surgeon: [name], MD;  Location: [facility] ENDOSCOPY;  Service: Cardiovascular;  Laterality: N/A;   THYROIDECTOMY     TONSILLECTOMY      Prior to Admission medications   Medication Sig Start Date End Date Taking? Authorizing Provider  ACCU-CHEK GUIDE test strip   01/09/20   [provider]  acetaminophen (TYLENOL) 650 MG CR tablet Take 650 mg by mouth 3 (three) times daily.    [provider]  apixaban (ELIQUIS) 5 MG TABS tablet Take 1 tablet (5 mg total) by mouth 2 (two) times daily. 12/18/22   Sherran Needs, NP  Blood Glucose Monitoring Suppl (ACCU-CHEK GUIDE ME) w/Device KIT  01/09/20   [provider]  cetirizine (ZYRTEC) 10 MG tablet Take 10 mg by mouth daily.    [provider]  cholecalciferol (VITAMIN D) 25 MCG (1000 UNIT) tablet  Take 1,000 Units by mouth daily.    [provider]  ferrous sulfate 325 (65 FE) MG tablet Take 325 mg by mouth daily with breakfast.    [provider]  fluticasone (FLONASE) 50 MCG/ACT nasal spray Place 1 spray into both nostrils at bedtime as needed for allergies.     [provider]  levothyroxine (SYNTHROID) 150 MCG tablet Take 150 mcg by mouth every morning. 12/25/22   [provider]  lipase/protease/amylase (CREON) 36000 UNITS CPEP capsule Take 3 capsule with each meal and 2 capsule with snacks(up to 2 snacks daily) 12/29/22   Levin Erp, PA  loperamide (IMODIUM) 2 MG capsule Take 1 capsule (2 mg total) by mouth every 4 (four) hours. 12/27/22   Levin Erp, PA  LORazepam (ATIVAN) 1 MG tablet Take 0.5 mg by mouth at bedtime. Take additional 0.5 mg if needed    [provider]  metoprolol tartrate (LOPRESSOR) 100 MG tablet TAKE 1 TABLET(100 MG) BY MOUTH TWICE DAILY Patient taking differently: Take 100 mg by mouth 2 (two) times daily. 11/30/22   Sherran Needs, NP  Polyvinyl Alcohol-Povidone PF 1.4-0.6 % SOLN Place 1 drop into both eyes daily as needed (dry eyes).     [provider]  potassium chloride (KLOR-CON M) 10 MEQ tablet Take 10 mEq by mouth daily. 12/22/22   [provider]  predniSONE (DELTASONE) 10 MG tablet Take 4 tablets (40 mg total) by mouth daily with breakfast for 3 days, THEN 3 tablets  (30 mg total) daily with breakfast for 3 days, THEN 2 tablets (20 mg total) daily with breakfast for 14 days, THEN 1 tablet (10 mg total) daily with breakfast for 14 days, THEN 0.5 tablets (5 mg total) daily with breakfast for 14 days. 01/09/23 02/26/23  Gatha Mayer, MD  rosuvastatin (CRESTOR) 10 MG tablet Take 10 mg by mouth at bedtime.    [provider]  sertraline (ZOLOFT) 50 MG tablet Take 50 mg by mouth daily.    [provider]  vitamin B-12 (CYANOCOBALAMIN) 500 MCG tablet Take 500 mcg by mouth daily.    [provider]  zinc gluconate 50 MG tablet Take 50 mg by mouth daily.    [provider]    Current Facility-Administered Medications  Medication Dose Route Frequency Provider Last Rate Last Admin   acetaminophen (TYLENOL) tablet 650 mg  650 mg Oral Q6H PRN Lenore Cordia, MD       Or   acetaminophen (TYLENOL) suppository 650 mg  650 mg Rectal Q6H PRN Lenore Cordia, MD       heparin injection 5,000 Units  5,000 Units Subcutaneous Q8H Zada Finders R, MD   5,000 Units at 01/11/23 0646   insulin aspart (novoLOG) injection 0-9 Units  0-9 Units Subcutaneous TID WC Lenore Cordia, MD       levothyroxine (SYNTHROID) tablet 150 mcg  150 mcg Oral q morning Zada Finders R, MD       LORazepam (ATIVAN) tablet 0.5 mg  0.5 mg Oral QHS Zada Finders R, MD   0.5 mg at 01/10/23 2251   methylPREDNISolone sodium succinate (SOLU-MEDROL) 40 mg/mL injection 40 mg  40 mg Intravenous Daily Davonna Belling, MD   40 mg at 01/10/23 1925   metoprolol tartrate (LOPRESSOR) tablet 100 mg  100 mg Oral BID Zada Finders R, MD   100 mg at 01/10/23 2307   ondansetron (ZOFRAN) tablet 4 mg  4 mg Oral Q6H PRN Posey Pronto,  Cleaster Corin, MD       Or   ondansetron (ZOFRAN) injection 4 mg  4 mg Intravenous Q6H PRN Zada Finders R, MD       rosuvastatin (CRESTOR) tablet 10 mg  10 mg Oral QHS Zada Finders R, MD   10 mg at 01/10/23 2308   sertraline (ZOLOFT) tablet 50 mg  50 mg Oral Daily  Zada Finders R, MD       sodium chloride flush (NS) 0.9 % injection 3 mL  3 mL Intravenous Q12H Zada Finders R, MD   3 mL at 01/10/23 2200    Allergies as of 01/10/2023 - Review Complete 01/10/2023  Allergen Reaction Noted   Penicillins Itching, Rash, and Other (See Comments)     Family History  Problem Relation Age of Onset   Prostate cancer Father    Diabetes Sister    Heart disease Sister    Heart attack Brother    Heart disease Brother    Diabetes Brother    Diabetes Sister    Diabetes Sister    Pancreatic cancer Sister    Diabetes Brother    Heart disease Brother    Diabetes Brother    Heart disease Brother    Diabetes Brother    Heart disease Brother    Diabetes Brother    Heart disease Brother    Diabetes Brother    Heart disease Brother    Diabetes Brother    Diabetes Child     Social History   Socioeconomic History   Marital status: Divorced    Spouse name: Not on file   Number of children: 2   Years of education: Not on file   Highest education level: Not on file  Occupational History   Occupation: Retired    Comment: Education officer, museum  Tobacco Use   Smoking status: Never   Smokeless tobacco: Never  Scientific laboratory technician Use: Never used  Substance and Sexual Activity   Alcohol use: No    Alcohol/week: 0.0 standard drinks of alcohol   Drug use: No   Sexual activity: Never  Other Topics Concern   Not on file  Social History Narrative   Not on file   Social Determinants of Health   Financial Resource Strain: Not on file  Food Insecurity: Not on file  Transportation Needs: Not on file  Physical Activity: Not on file  Stress: Not on file  Social Connections: Not on file  Intimate Partner Violence: Not on file    Review of Systems: Gen: Denies fever, sweats or chills. No weight loss.  CV: Denies chest pain, palpitations or edema. Resp: See HPI.  No hemoptysis.   GI: See HPI.  No GERD symptoms. GU : Denies urinary burning, blood in urine,  increased urinary frequency or incontinence. MS: Denies joint pain, muscles aches or weakness. Derm: Denies rash, itchiness, skin lesions or unhealing ulcers. Psych: Denies depression, anxiety, memory loss or confusion. Heme: Denies easy bruising, bleeding. Neuro:  Denies headaches, dizziness or paresthesias. Endo:  Denies any problems with DM, thyroid or adrenal function.  Physical Exam: Vital signs in last 24 hours: Temp:  [97.5 F (36.4 C)-98 F (36.7 C)] 98 F (36.7 C) (02/29 0330) Pulse Rate:  [92-118] 104 (02/29 0748) Resp:  [18-32] 21 (02/29 0748) BP: (105-145)/(68-104) 126/104 (02/29 0748) SpO2:  [94 %-99 %] 99 % (02/29 0748) Weight:  [66.9 kg] 66.9 kg (02/28 1529)   General: 89 year old female hard of hearing in no acute distress.  Head:  Normocephalic and atraumatic. Eyes:  No scleral icterus. Conjunctiva pink. Ears:  Normal auditory acuity. Nose:  No deformity, discharge or lesions. Mouth:  Dentition intact. No ulcers or lesions.  Neck:  Supple. No lymphadenopathy or thyromegaly.  Lungs: Breath sounds with fine bibasilar crackles otherwise clear.  On oxygen 3 L nasal cannula. Heart: Irregular rhythm, no murmur. Abdomen: Soft, nondistended.  Nontender.  Positive bowel sounds to all 4 quadrants. Rectal: Deferred. Musculoskeletal:  Symmetrical without gross deformities.  Pulses:  Normal pulses noted. Extremities:  Without clubbing or edema. Neurologic:  Alert and  oriented x 4. No focal deficits.  Skin:  Intact without significant lesions or rashes. Psych:  Alert and cooperative. Normal mood and affect.  Intake/Output from previous day: No intake/output data recorded. Intake/Output this shift: No intake/output data recorded.  Lab Results: Recent Labs    01/10/23 1704 01/11/23 0540  WBC 5.3 5.5  HGB 11.4* 10.8*  HCT 35.2* 32.9*  PLT 230 213   BMET Recent Labs    01/10/23 1704 01/11/23 0540  NA 136 134*  K 3.8 3.2*  CL 100 100  CO2 27 26  GLUCOSE  182* 152*  BUN 14 16  CREATININE 0.85 0.63  CALCIUM 8.6* 8.3*   LFT Recent Labs    01/10/23 1704  PROT 6.3*  ALBUMIN 3.1*  AST 29  ALT 14  ALKPHOS 55  BILITOT 1.2   PT/INR Recent Labs    01/10/23 1704  LABPROT 22.9*  INR 2.1*   Hepatitis Panel No results for input(s): "HEPBSAG", "HCVAB", "HEPAIGM", "HEPBIGM" in the last 72 hours.    Studies/Results: CT ABDOMEN PELVIS W CONTRAST  Result Date: 01/10/2023 CLINICAL DATA:  Bowel obstruction suspected Diarrhea and generalized weakness. EXAM: CT ABDOMEN AND PELVIS WITH CONTRAST TECHNIQUE: Multidetector CT imaging of the abdomen and pelvis was performed using the standard protocol following bolus administration of intravenous contrast. RADIATION DOSE REDUCTION: This exam was performed according to the departmental dose-optimization program which includes automated exposure control, adjustment of the mA and/or kV according to patient size and/or use of iterative reconstruction technique. CONTRAST:  53m OMNIPAQUE IOHEXOL 300 MG/ML  SOLN COMPARISON:  CT 03/23/2022 FINDINGS: Lower chest: Moderate right and small left pleural effusion. Associated compressive atelectasis. Cardiomegaly with primarily right heart dilatation. Wall thickening of the distal esophagus with small hiatal hernia. Hepatobiliary: Stable small low-density lesions are too small to accurately characterize but likely cysts. No new liver lesions. Layering gallstones in the gallbladder. No pericholecystic inflammation. No biliary dilatation. Pancreas: Mild fatty atrophy.  No ductal dilatation or inflammation. Spleen: Normal in size without focal abnormality. Adrenals/Urinary Tract: No adrenal nodule. Symmetric prominence of both renal collecting systems without ureteral dilatation. There are bilateral simple cysts that need no further follow-up. Indeterminate exophytic lesion from the upper pole of the left kidney measures 12 mm, series 2, image 23, previously 9 mm. Partially  distended urinary bladder. Stomach/Bowel: Moderate-sized hiatal hernia with wall thickening of the distal esophagus. Air in fluid within the stomach without gastric wall thickening. No small bowel obstruction or inflammatory change. Minimal fluid within distal small bowel loops in the pelvis. The appendix is not confidently seen. Mild wall thickening of the ascending colon, series 2, image 55. Formed stool in the remainder of the ascending colon. Air-filled transverse colon that is mildly prominent. Mixed liquid and solid stool in the descending colon. Small amount of formed stool in the sigmoid colon. Mild sigmoid colonic diverticulosis without diverticulitis. Rectum is distended with air and a  small amount of mixed liquid and solid stool. No obvious colonic mass. Vascular/Lymphatic: Aortic atherosclerosis and tortuosity. No aneurysm. No acute vascular findings. No adenopathy. Reproductive: Hysterectomy. No adnexal mass. Other: No ascites or free fluid. No free intra-abdominal air. There is generalized subcutaneous body wall edema. Fat in both inguinal canals. Musculoskeletal: Posterior L4-L5 fusion hardware. Degenerative change of both hips and the pubic symphysis. There are no acute or suspicious osseous abnormalities. IMPRESSION: 1. Mild wall thickening of the ascending colon, possible colitis. 2. Moderate-sized hiatal hernia with wall thickening of the distal esophagus, can be seen with reflux or esophagitis. 3. Moderate right and small left pleural effusions with compressive atelectasis. Generalized subcutaneous body wall edema. 4. Cholelithiasis without CT findings of acute cholecystitis. 5. Indeterminate exophytic lesion from the upper pole of the left kidney measures 12 mm, previously 9 mm. Recommend nonemergent renal protocol MRI for characterization after resolution of acute event, preferably as an outpatient when patient can tolerate breath hold technique. 6. Mild sigmoid colonic diverticulosis without  diverticulitis. Aortic Atherosclerosis (ICD10-I70.0). Electronically Signed   By: Keith Rake M.D.   On: 01/10/2023 21:15   Abd 1 View (KUB)  Result Date: 01/10/2023 CLINICAL DATA:  Chronic diarrhea. EXAM: ABDOMEN - 1 VIEW COMPARISON:  None Available. FINDINGS: Generalized gaseous distention of colon is seen without significant small bowel dilatation. Lumbar spine fusion hardware noted. IMPRESSION: Generalized gaseous distention of colon. Differential diagnosis includes colonic ileus and distal colonic obstruction. Consider abdomen pelvis CT for further evaluation if clinically warranted. Electronically Signed   By: Marlaine Hind M.D.   On: 01/10/2023 19:53    IMPRESSION/PLAN:  89 year old female with progressive chronic diarrhea with elevated fecal calprotectin and positive lactoferrin level. Admitted to the hospital 01/10/2023 for inpatient colonoscopy, rule out colitis/IBD.  CTAP showed mild wall thickening to the ascending colon concerning for colitis. -Await repeat C. difficile test result -Continue Solu-Medrol IV 40 mg IV daily -Clear liquid diet  -Colonoscopy as an inpatient, once respiratory status stabilized -CBC, BMP in am -Await further recommendations per Dr. Fuller Plan  History of chronic diastolic CHF with acute CHF.  Chest x-ray today consistent with CHF with a moderate pleural effusion on the right and a small pleural effusion on the left.  LV EF 70 to 75% with grade 1 diastolic dysfunction per ECHO 07/2020. BNP ordered.  IV fluids dc'd. -Management per the hospitalist  Atrial fibrillation. CHA2DS2-VASc Score = 5  On Metoprolol and Eliquis.  Last dose of Eliquis was 8 AM on 2/28. -Continue to hold Eliquis  Hypokalemia. K+ 2.5.  -KCL replacement per the hospitalist  Macrocytic anemia. No overt GI bleeding. Hg 11.4 -> 10.8, likely dilutional. -CBC, iron panel and B12 level in a.m.  History of GERD, Barrett's esophagus s/p hiatal hernia repair and Nissen fundoplication [id number].   CTAP showed a moderate size hiatal hernia with wall thickening to the distal esophagus consistent with reflux esophagitis. -Pantoprazole 40 mg p.o. daily  Indeterminate exophytic lesion to the left kidney per CT -Follow-up with PCP as an outpatient   Noralyn Pick  01/11/2023, 10:00AM   Attending Physician Note   I have taken an interval history, reviewed the chart and examined the patient. I performed a substantive portion of this encounter, including complete performance of at least one of the key components, in conjunction with the APP. I agree with the APP's note, impression and recommendations with my edits. My additional impressions and recommendations are as follows.   Progressively worsening diarrhea with positive lactoferrin  and elevated calprotectin. CT AP shows mild ascending colon wall thickening. R/O IBD, other colitis. Solu-Medrol 40 mg qd started empirically. Colonoscopy when cardiopulmonary status has stabilized.   Hypoxic respiratory failure likely related to CHF. BNP=468.9  Afib on Eliquis, holding for colonoscopy, last dose 2/28  GERD, moderate-sized HH, Barrett's. S/P Nissen and HH repair in 2018. Continue pantoprazole 40 mg qd.   Macrocytic anemia.   Lucio Edward, MD [facility] See AM[facility], [facility], for our on call provider

## 2023-01-11 NOTE — Progress Notes (Deleted)
Physical Therapy Evaluation Patient Details Name: [name] MRN: [id number] DOB: [birth year] Today's Date: 01/11/2023  History of Present Illness  [name] is a 89 y.o. female with medical history significant for persistent A-fib on Eliquis, T2DM, HTN, hypothyroidism, HLD, GERD, IDA, s/p hiatal hernia repair and Nissen fundoplication [id number], depression/anxiety who presented to the ED per her GI specialist recommendation for further evaluation and management of generalized weakness associated with persistent diarrhea. Pt presenting with dec o2 sats and afib.  Clinical Impression   Pt presents with decreased mobility and independence secondary to the above diagnosis. Pt reported she does not use home O2. Pt's mobility was limited today secondary to elevated HR and desaturation with mobility. O2 sats 79-95% on 3 liters and bumped up to 4 liters during mobility. O2 sats recovered with pursed lip breathing and returning supine in bed. Pt has family assistance and a hired aide who assists with mobility and care at home. They are requesting d/c home with continued HHPT services. Pt will benefit from acute skilled PT to maximize mobility and independence for d/c home when stable.       Recommendations for follow up therapy are one component of a multi-disciplinary discharge planning process, led by the attending physician.  Recommendations may be updated based on patient status, additional functional criteria and insurance authorization.  Follow Up Recommendations Home health PT      Assistance Recommended at Discharge Intermittent Supervision/Assistance  Patient can return home with the following  A little help with walking and/or transfers;Assistance with cooking/housework;Assist for transportation;Help with stairs or ramp for entrance    Equipment Recommendations None recommended by PT  Recommendations for Other Services       Functional Status Assessment Patient has had a recent  decline in their functional status and demonstrates the ability to make significant improvements in function in a reasonable and predictable amount of time.     Precautions / Restrictions Precautions Precautions: Other (comment) Precaution Comments: HR, O2 sats Restrictions Weight Bearing Restrictions: No      Mobility  Bed Mobility Overal bed mobility: Needs Assistance Bed Mobility: Supine to Sit     Supine to sit: Min guard, HOB elevated     General bed mobility comments: cues for technique, O2 sats began to decline, transitional rest breaks to focus on pursed lip breathing    Transfers Overall transfer level: Needs assistance Equipment used: 1 person hand held assist Transfers: Sit to/from Stand Sit to Stand: Min assist, From elevated surface           General transfer comment: posterior lean, dec O2 sats    Ambulation/Gait Ambulation/Gait assistance: Min assist Gait Distance (Feet): 2 Feet Assistive device: None   Gait velocity: decreased   Pre-gait activities: side stepping General Gait Details: side stepping to HOB, min assist due to HHA. Pt would be more independent with walker.  Stairs            Wheelchair Mobility    Modified Rankin (Stroke Patients Only)       Balance Overall balance assessment: Needs assistance Sitting-balance support: No upper extremity supported, Feet supported Sitting balance-Leahy Scale: Good     Standing balance support: Single extremity supported Standing balance-Leahy Scale: Poor Standing balance comment: posterior lean                             Pertinent Vitals/Pain Pain Assessment Pain Assessment: No/denies pain    Home  Living Family/patient expects to be discharged to:: Private residence Living Arrangements: Alone Available Help at Discharge: Personal care attendant;Family;Friend(s);Available PRN/intermittently Type of Home: House Home Access: Stairs to enter Entrance Stairs-Rails:  Left Entrance Stairs-Number of Steps: 3-4   Home Layout: One level Home Equipment: Toilet riser;Tub bench;Cane - single point;Rollator (4 wheels);Standard Walker;BSC/3in1 Additional Comments: has hired Engineer, production to assist as needed    Prior Function Prior Level of Function : Needs assist       Physical Assist : Mobility (physical) Mobility (physical): Gait;Stairs   Mobility Comments: SW for gait, aide provides stand by assist       Hand Dominance        Extremity/Trunk Assessment        Lower Extremity Assessment Lower Extremity Assessment:  (MMT 3-4/5 bil LE)       Communication   Communication: No difficulties  Cognition Arousal/Alertness: Awake/alert Behavior During Therapy: WFL for tasks assessed/performed Overall Cognitive Status: Within Functional Limits for tasks assessed                                 General Comments: pt's aide was present and daughter also provided additional PLF        General Comments General comments (skin integrity, edema, etc.): monitored O2 sats and HR throughout session. HR between 90-130 with EOB and standing, O2 sats 79-95% with 3 liters    Exercises     Assessment/Plan    PT Assessment Patient needs continued PT services  PT Problem List Decreased strength;Decreased balance;Decreased mobility;Cardiopulmonary status limiting activity;Decreased activity tolerance       PT Treatment Interventions DME instruction;Functional mobility training;Balance training;Patient/family education;Gait training;Therapeutic activities;Therapeutic exercise    PT Goals (Current goals can be found in the Care Plan section)  Acute Rehab PT Goals Patient Stated Goal: to return home PT Goal Formulation: With patient Time For Goal Achievement: 01/25/23 Potential to Achieve Goals: Good    Frequency Min 3X/week     Co-evaluation               AM-PAC PT "6 Clicks" Mobility  Outcome Measure Help needed turning from your  back to your side while in a flat bed without using bedrails?: A Little Help needed moving from lying on your back to sitting on the side of a flat bed without using bedrails?: A Little Help needed moving to and from a bed to a chair (including a wheelchair)?: A Little Help needed standing up from a chair using your arms (e.g., wheelchair or bedside chair)?: A Little Help needed to walk in hospital room?: A Little Help needed climbing 3-5 steps with a railing? : A Lot 6 Click Score: 17    End of Session Equipment Utilized During Treatment: Gait belt Activity Tolerance: Treatment limited secondary to medical complications (Comment) (desat, elevated HR) Patient left: in bed;with call bell/phone within reach;with family/visitor present;with bed alarm set Nurse Communication: Mobility status PT Visit Diagnosis: Muscle weakness (generalized) (M62.81);Unsteadiness on feet (R26.81)    Time: [id number] PT Time Calculation (min) (ACUTE ONLY): 23 min   Charges:   PT Evaluation $PT Eval Moderate Complexity: 1 Mod PT Treatments $Therapeutic Activity: 8-22 mins        Lelon Mast 01/11/2023, 11:38 AM

## 2023-01-11 NOTE — Progress Notes (Signed)
Physical Therapy Evaluation Patient Details Name: [name] MRN: [id number] DOB: [birth year] Today's Date: 01/11/2023  History of Present Illness  [name] is a 89 y.o. female with medical history significant for persistent A-fib on Eliquis, T2DM, HTN, hypothyroidism, HLD, GERD, IDA, s/p hiatal hernia repair and Nissen fundoplication [id number], depression/anxiety who presented to the ED per her GI specialist recommendation for further evaluation and management of generalized weakness associated with persistent diarrhea. Pt presenting with dec o2 sats and afib.  Clinical Impression  Pt presents with decreased mobility and independence secondary to the above diagnosis. Pt reported she does not use home O2. Pt's mobility was limited today secondary to elevated HR and desaturation with mobility. O2 sats 79-95% on 3 liters and bumped up to 4 liters during mobility. O2 sats recovered with pursed lip breathing and returning supine in bed. Pt has family assistance and a hired aide who assists with mobility and care at home. They are requesting d/c home with continued HHPT services. Pt will benefit from acute skilled PT to maximize mobility and independence for d/c home when stable.        Recommendations for follow up therapy are one component of a multi-disciplinary discharge planning process, led by the attending physician.  Recommendations may be updated based on patient status, additional functional criteria and insurance authorization.  Follow Up Recommendations Home health PT      Assistance Recommended at Discharge Intermittent Supervision/Assistance  Patient can return home with the following  A little help with walking and/or transfers;Assistance with cooking/housework;Assist for transportation;Help with stairs or ramp for entrance    Equipment Recommendations None recommended by PT  Recommendations for Other Services       Functional Status Assessment Patient has had a recent  decline in their functional status and demonstrates the ability to make significant improvements in function in a reasonable and predictable amount of time.     Precautions / Restrictions Precautions Precautions: Other (comment) Precaution Comments: HR, O2 sats Restrictions Weight Bearing Restrictions: No      Mobility  Bed Mobility Overal bed mobility: Needs Assistance Bed Mobility: Supine to Sit     Supine to sit: Min guard, HOB elevated     General bed mobility comments: cues for technique, O2 sats began to decline, transitional rest breaks to focus on pursed lip breathing    Transfers Overall transfer level: Needs assistance Equipment used: 1 person hand held assist Transfers: Sit to/from Stand Sit to Stand: Min assist, From elevated surface           General transfer comment: posterior lean, dec O2 sats    Ambulation/Gait Ambulation/Gait assistance: Min assist Gait Distance (Feet): 2 Feet Assistive device: None   Gait velocity: decreased   Pre-gait activities: side stepping General Gait Details: side stepping to HOB, min assist due to HHA. Pt would be more independent with walker.  Stairs            Wheelchair Mobility    Modified Rankin (Stroke Patients Only)       Balance Overall balance assessment: Needs assistance Sitting-balance support: No upper extremity supported, Feet supported Sitting balance-Leahy Scale: Good     Standing balance support: Single extremity supported Standing balance-Leahy Scale: Poor Standing balance comment: posterior lean                             Pertinent Vitals/Pain Pain Assessment Pain Assessment: No/denies pain    Home  Living Family/patient expects to be discharged to:: Private residence Living Arrangements: Alone Available Help at Discharge: Personal care attendant;Family;Friend(s);Available PRN/intermittently Type of Home: House Home Access: Stairs to enter Entrance Stairs-Rails:  Left Entrance Stairs-Number of Steps: 3-4   Home Layout: One level Home Equipment: Toilet riser;Tub bench;Cane - single point;Rollator (4 wheels);Standard Walker;BSC/3in1 Additional Comments: has hired Engineer, production to assist as needed    Prior Function Prior Level of Function : Needs assist       Physical Assist : Mobility (physical) Mobility (physical): Gait;Stairs   Mobility Comments: SW for gait, aide provides stand by assist       Hand Dominance        Extremity/Trunk Assessment        Lower Extremity Assessment Lower Extremity Assessment:  (MMT 3-4/5 bil LE)       Communication   Communication: No difficulties  Cognition Arousal/Alertness: Awake/alert Behavior During Therapy: WFL for tasks assessed/performed Overall Cognitive Status: Within Functional Limits for tasks assessed                                 General Comments: pt's aide was present and daughter also provided additional PLF        General Comments General comments (skin integrity, edema, etc.): monitored O2 sats and HR throughout session. HR between 90-130 with EOB and standing, O2 sats 79-95% with 3 liters    Exercises     Assessment/Plan    PT Assessment Patient needs continued PT services  PT Problem List Decreased strength;Decreased balance;Decreased mobility;Cardiopulmonary status limiting activity;Decreased activity tolerance       PT Treatment Interventions DME instruction;Functional mobility training;Balance training;Patient/family education;Gait training;Therapeutic activities;Therapeutic exercise    PT Goals (Current goals can be found in the Care Plan section)  Acute Rehab PT Goals Patient Stated Goal: to return home PT Goal Formulation: With patient Time For Goal Achievement: 01/25/23 Potential to Achieve Goals: Good    Frequency Min 3X/week     Co-evaluation               AM-PAC PT "6 Clicks" Mobility  Outcome Measure Help needed turning from your  back to your side while in a flat bed without using bedrails?: A Little Help needed moving from lying on your back to sitting on the side of a flat bed without using bedrails?: A Little Help needed moving to and from a bed to a chair (including a wheelchair)?: A Little Help needed standing up from a chair using your arms (e.g., wheelchair or bedside chair)?: A Little Help needed to walk in hospital room?: A Little Help needed climbing 3-5 steps with a railing? : A Lot 6 Click Score: 17    End of Session Equipment Utilized During Treatment: Gait belt Activity Tolerance: Treatment limited secondary to medical complications (Comment) (desat, elevated HR) Patient left: in bed;with call bell/phone within reach;with family/visitor present;with bed alarm set Nurse Communication: Mobility status PT Visit Diagnosis: Muscle weakness (generalized) (M62.81);Unsteadiness on feet (R26.81)    Time: [id number] PT Time Calculation (min) (ACUTE ONLY): 23 min   Charges:   PT Evaluation $PT Eval Moderate Complexity: 1 Mod PT Treatments $Therapeutic Activity: 8-22 mins        Lelon Mast 01/11/2023, 11:49 AM

## 2023-01-12 ENCOUNTER — Inpatient Hospital Stay (HOSPITAL_COMMUNITY): Payer: Medicare PPO

## 2023-01-12 DIAGNOSIS — K529 Noninfective gastroenteritis and colitis, unspecified: Secondary | ICD-10-CM | POA: Diagnosis not present

## 2023-01-12 DIAGNOSIS — R197 Diarrhea, unspecified: Secondary | ICD-10-CM | POA: Diagnosis not present

## 2023-01-12 DIAGNOSIS — I4819 Other persistent atrial fibrillation: Secondary | ICD-10-CM | POA: Diagnosis not present

## 2023-01-12 DIAGNOSIS — I5033 Acute on chronic diastolic (congestive) heart failure: Secondary | ICD-10-CM | POA: Diagnosis not present

## 2023-01-12 LAB — COMPREHENSIVE METABOLIC PANEL
ALT: 11 U/L (ref 0–44)
AST: 13 U/L — ABNORMAL LOW (ref 15–41)
Albumin: 2.7 g/dL — ABNORMAL LOW (ref 3.5–5.0)
Alkaline Phosphatase: 48 U/L (ref 38–126)
Anion gap: 8 (ref 5–15)
BUN: 16 mg/dL (ref 8–23)
CO2: 31 mmol/L (ref 22–32)
Calcium: 8.1 mg/dL — ABNORMAL LOW (ref 8.9–10.3)
Chloride: 100 mmol/L (ref 98–111)
Creatinine, Ser: 0.68 mg/dL (ref 0.44–1.00)
GFR, Estimated: >60 mL/min (ref >60)
Glucose, Bld: 114 mg/dL — ABNORMAL HIGH (ref 70–99)
Potassium: 3 mmol/L — ABNORMAL LOW (ref 3.5–5.1)
Sodium: 139 mmol/L (ref 135–145)
Total Bilirubin: 0.6 mg/dL (ref 0.3–1.2)
Total Protein: 5.6 g/dL — ABNORMAL LOW (ref 6.5–8.1)

## 2023-01-12 LAB — ECHOCARDIOGRAM COMPLETE
Area-P 1/2: 5.62 cm2
Est EF: 65
Height: 65 in
MV M vel: 4.72 m/s
MV Peak grad: 89.1 mmHg
P 1/2 time: 545 ms
S' Lateral: 2.7 cm
Weight: 2400 [oz_av]

## 2023-01-12 LAB — GLUCOSE, CAPILLARY
Glucose-Capillary: 99 mg/dL (ref 70–99)
Glucose-Capillary: 153 mg/dL — ABNORMAL HIGH (ref 70–99)
Glucose-Capillary: 180 mg/dL — ABNORMAL HIGH (ref 70–99)
Glucose-Capillary: 210 mg/dL — ABNORMAL HIGH (ref 70–99)

## 2023-01-12 LAB — C DIFFICILE QUICK SCREEN W PCR REFLEX
C Diff antigen: NEGATIVE
C Diff interpretation: NOT DETECTED
C Diff toxin: NEGATIVE

## 2023-01-12 LAB — MAGNESIUM: Magnesium: 1.5 mg/dL — ABNORMAL LOW (ref 1.7–2.4)

## 2023-01-12 LAB — APTT: aPTT: 81 seconds — ABNORMAL HIGH (ref 24–36)

## 2023-01-12 LAB — HEPARIN LEVEL (UNFRACTIONATED): Heparin Unfractionated: 0.75 IU/mL — ABNORMAL HIGH (ref 0.30–0.70)

## 2023-01-12 MED ORDER — PEG-KCL-NACL-NASULF-NA ASC-C 100 G PO SOLR: 0.5000 | Freq: Once | ORAL | Status: DC

## 2023-01-12 MED ORDER — MAGNESIUM SULFATE 4 GM/100ML IV SOLN
4.0000 g | Freq: Once | INTRAVENOUS | Status: AC
  Administered 2023-01-12: 4 g via INTRAVENOUS
  Filled 2023-01-12: qty 100

## 2023-01-12 MED ORDER — HEPARIN (PORCINE) 25000 UT/250ML-% IV SOLN: 1050.0000 [IU]/h | INTRAVENOUS | Status: DC

## 2023-01-12 MED ORDER — PEG-KCL-NACL-NASULF-NA ASC-C 100 G PO SOLR
0.5000 | Freq: Once | ORAL | Status: DC
  Filled 2023-01-12: qty 1

## 2023-01-12 MED ORDER — FUROSEMIDE 10 MG/ML IJ SOLN
40.0000 mg | Freq: Once | INTRAMUSCULAR | Status: AC
  Administered 2023-01-12: 40 mg via INTRAVENOUS
  Filled 2023-01-12: qty 4

## 2023-01-12 MED ORDER — HEPARIN (PORCINE) 25000 UT/250ML-% IV SOLN
1050.0000 [IU]/h | INTRAVENOUS | Status: DC
  Administered 2023-01-12: 1050 [IU]/h via INTRAVENOUS
  Filled 2023-01-12: qty 250

## 2023-01-12 MED ORDER — POTASSIUM CHLORIDE 10 MEQ/100ML IV SOLN
10.0000 meq | INTRAVENOUS | Status: AC
  Administered 2023-01-12 (×3): 10 meq via INTRAVENOUS
  Filled 2023-01-12 (×3): qty 100

## 2023-01-12 MED ORDER — POTASSIUM CHLORIDE CRYS ER 20 MEQ PO TBCR
40.0000 meq | EXTENDED_RELEASE_TABLET | Freq: Two times a day (BID) | ORAL | Status: DC
  Administered 2023-01-12 – 2023-01-13 (×4): 40 meq via ORAL
  Filled 2023-01-12 (×4): qty 2

## 2023-01-12 MED ORDER — PEG-KCL-NACL-NASULF-NA ASC-C 100 G PO SOLR: 1.0000 | Freq: Once | ORAL | Status: DC

## 2023-01-12 NOTE — Progress Notes (Addendum)
ANTICOAGULATION CONSULT NOTE - Initial Consult  Pharmacy Consult for  Heparin Indication: atrial fibrillation  Allergies  Allergen Reactions   Penicillins Itching, Rash and Other (See Comments)    Did it involve swelling of the face/tongue/throat, SOB, or low BP? No Did it involve sudden or severe rash/hives, skin peeling, or any reaction on the inside of your mouth or nose? No Did you need to seek medical attention at a hospital or doctor's office? No When did it last happen?    30+ years   If all above answers are "NO", may proceed with cephalosporin use.     Patient Measurements: Height: '5\' 5"'$  (165.1 cm) Weight: 68 kg (150 lb) IBW/kg (Calculated) : 57 Heparin Dosing Weight: n/a  Vital Signs: Temp: 98.1 F (36.7 C) (03/01 0400) Temp Source: Oral (03/01 0400) BP: 123/81 (03/01 0400) Pulse Rate: 104 (03/01 0400)  Labs: Recent Labs    01/10/23 1704 01/11/23 0540 01/11/23 0951 01/12/23 0602  HGB 11.4* 10.8*  --   --   HCT 35.2* 32.9*  --   --   PLT 230 213  --   --   LABPROT 22.9*  --   --   --   INR 2.1*  --   --   --   CREATININE 0.85 0.63  --  0.68  TROPONINIHS  --   --  7  --     Estimated Creatinine Clearance: 42.9 mL/min (by C-G formula based on SCr of 0.68 mg/dL).   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   Allergy    SEASONAL   Anemia    years ago   Anticoagulated by anticoagulation treatment - Eliquis 03/30/2014   Started May 2015    Anxiety state 10/13/2010   Qualifier: Diagnosis of  By: Nils Pyle CMA (AAMA), Leisha     Arthritis    "all my joints" (03/31/2014)   Barrett's esophagus    Breast cancer ([place])    s/p right breast lumpectomy   CARDIAC MURMUR 10/13/2010   Qualifier: Diagnosis of  By: Nils Pyle CMA (AAMA), Leisha     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   Cervical cancer ([place])    Chronic cough 01/02/2017   GERD, hiatal hernia Doubt amio   COLONIC POLYPS, ADENOMATOUS, HX OF 10/13/2010   Qualifier: Diagnosis of  By: Nils Pyle CMA (AAMA), Leisha     Degenerative disorder of eye 07/25/2012   Diabetes mellitus without complication ([place])    Dilated cardiomyopathy ([place]) 08/27/2018   DIVERTICULOSIS, COLON 10/13/2010   Qualifier: Diagnosis of  By: Nils Pyle CMA (AAMA), Leisha     DJD (degenerative joint disease) of knee    Dysrhythmia    Afib   Echocardiogram 07/2020    Echo 9/21: EF 70-75, moderate LVH, GR 1 DD, RVSP 40.5 (mildly elevated), normal RVSF, moderate LAE, mild RAE, mild MR, trivial AI   EROSIVE ESOPHAGITIS 10/13/2010   Qualifier: Diagnosis of  By: Nils Pyle CMA (AAMA), Leisha     Error, refractive, myopia 07/09/2012   Essential hypertension 10/13/2010   Qualifier: Diagnosis of  By: Nils Pyle CMA (AAMA), Rueben Bash, intermittent 07/09/2012   GERD 10/14/2010   Qualifier: Diagnosis of  By: Sharlett Iles MD Byrd Hesselbach    GERD (gastroesophageal reflux disease)    Glaucoma  H/O hiatal hernia    Heart murmur    Hiatal hernia s/p robotic repair & fundoplication [id number]   Qualifier: Diagnosis of  By: Nils Pyle CMA (AAMA), Mearl Latin     History of laser assisted in situ keratomileusis 07/25/2012   Hx of transesophageal echocardiography (TEE) for monitoring    TEE (03/2014): No LAA clot, moderate LAE, core triatriatum type structure in RA with no stenosis, normal EF 55-60%, mild MR   HYPERCHOLESTEROLEMIA 10/13/2010   Qualifier: Diagnosis of  By: Nils Pyle CMA (AAMA), Leisha     Hypertension    Hypothyroidism, postsurgical    Iron Deficiency Anemia 10/14/2010   Qualifier: Diagnosis of  By: Sharlett Iles MD Cline Cools R    Irritable bowel syndrome 10/13/2010   Qualifier: Diagnosis of  By: Nils Pyle CMA (AAMA), Mearl Latin     Low serum potassium    Lumbar stenosis with neurogenic claudication 04/20/2016   Malignant  neoplasm of breast ([place]) 02/25/2016   Migraine    "last one was years ago" (03/31/2014)   MIGRAINE HEADACHE 10/13/2010   Qualifier: Diagnosis of  By: Nils Pyle CMA (AAMA), Leisha     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   Overview:  X(T)    OSA on CPAP    settings at 10-11    OSTEOARTHRITIS 10/13/2010   Qualifier: Diagnosis of  By: Nils Pyle CMA (AAMA), Leisha     Persistent atrial fibrillation 03/31/2015   DLCO dropped from 100% in 2015-50% in 01/2017   Post-surgical hypothyroidism 04/09/2014   RECTAL FISSURE 10/13/2010   Qualifier: Diagnosis of  By: Nils Pyle CMA (AAMA), Mearl Latin     S/P Nissen fundoplication (without gastrostomy tube) procedure 05/25/2017   Sleep apnea    THYROID CANCER 10/13/2010   Qualifier: Diagnosis of  By: Nils Pyle CMA Deborra Medina), Mearl Latin     Thyroid cancer [facility])    s/p thyroidectomy   Type II diabetes mellitus ([place])    type II     Medications:  PTA Apixaban on hold.  Assessment: Ms. Stockard is an 89 year old female with past medical history of permanent atrial fibrillation failed amiodarone and Tikosyn, chronic diastolic heart failure, hypertension, hyperlipidemia, DM2, hypothyroidism, history of hiatal hernia s/p Nissen fundoplication [id number] and anxiety/depression.  She is admitted for inpatient colonoscopy, rule out colitis/IBD.    PTA Apixaban '5mg'$  bid on hold in anticipation of possible colonoscopy.  Patient last reported dose of apixaban was 2/28 at 0800. Colonoscopy planned for 3/2.   Goal of Therapy:  Heparin level 0.3-0.7 units/ml aPTT level 66-102 sec Monitor platelets by anticoagulation protocol: Yes   Plan:  Omit Bolus.  Start heparin infusion at 1050 units/hr Will plan to use aPTT for dosing until correlates with HL. Check aPTT and HL in 8 hours. Further labs to be assessed afterward.   Daily CBC. Continue to monitor H&H and platelets  Mendel Ryder, PharmD Clinical Pharmacist 01/12/2023 12:12 PM

## 2023-01-12 NOTE — Progress Notes (Signed)
Pt had bm in beginning of exam, exam paused, enteric precautions along with pt care and medication.

## 2023-01-12 NOTE — Progress Notes (Signed)
Pharmacy: Re- Heparin  Patient is an 89 y.o F with hx afib on Eliquis PTA who was instructed by her gastroenterologist to come to the ED on 01/10/23 for further work-up and treatment of persistent diarrhea. Eliquis held on admission in anticipation for colonoscopy. Pharmacy was consulted on 01/12/23 to start heparin drip while Eliquis is on hold.  - last dose of Eliquis taken on 01/10/23 at 0800  Today, 01/12/2023: - Initial aptt 81 sec- therapeutic on IV heparin 1050 units/hr - first heparin level 0.75; - Elevated heparin level is likely due to the residual effect of Eliquis. Will monitor and adjust heparin drip using aPTT for now. Once heparin and aPTT levels correlate, then will use heparin level alone for adjusting drip. -No bleeding or infusion related concerns reported by RN  Goal of Therapy:  Heparin level 0.3-0.7 units/ml aPTT level 66-102 sec Monitor platelets by anticoagulation protocol: Yes  Plan: - Continue IV heparin 1050 units/hr - Check confirmatory aptt & heparin level in 8h - monitor for s/sx bleeding   Netta Cedars, PharmD, BCPS 01/12/2023'@10'$ :41 PM

## 2023-01-12 NOTE — Consult Note (Addendum)
Cardiology Consultation   Patient ID: [name] MRN: [id number]; DOB: [birth year]  Admit date: 01/10/2023 Date of Consult: 01/12/2023  PCP:  Corliss Blacker, MD   [place] Prov[facility] Cardiologist:  Sherren Mocha, MD        Patient Profile:   [name] is a 89 y.o. female with a hx of permanent atrial fibrillation failed amiodarone and Tikosyn, chronic diastolic heart failure, hypertension, hyperlipidemia, DM2, hypothyroidism, history of hiatal hernia s/p Nissen fundoplication [id number] and anxiety/depression who is being seen 01/12/2023 for the evaluation of afib RVR and mild volume overload at the request of Dr. Verlon Au.  History of Present Illness:   [name] is a pleasant 89 year old female with past medical history of permanent atrial fibrillation failed amiodarone and Tikosyn, chronic diastolic heart failure, hypertension, hyperlipidemia, DM2, hypothyroidism, history of hiatal hernia s/p Nissen fundoplication [id number] and anxiety/depression.  Last echocardiogram obtained on 07/21/2020 showed EF 70 to 75%, grade 1 DD, no regional wall motion abnormality, normal RV, mildly elevated pulmonary artery systolic pressure, moderate LAE, trivial AI.  She had multiple cardioversions in the past however due to recurrence despite amiodarone and Tikosyn, she was ultimately left on rate control.  She has been having frequent diarrhea since 6 months ago.  She has been placed on Creon for possible pancreatic insufficiency.  She was most recently seen in the A-fib clinic on 12/07/2022.  Patient was admitted on 12/11/2022 was frequent diarrhea and hypokalemia.  Metformin was held on discharge to see if it help with diarrhea improvement.  She also had a mechanical fall, imaging was negative for any fractures.  Patient returned back to the hospital on 01/02/2023 at the recommendation of her GI doctor due to weakness and explosive diarrhea up to 6 times per day.  Blood work was abnormal for  fecal lactoferrin suggestive of inflammatory component.  Chest x-ray showed moderate pleural effusion on the right, small and pleural effusion on the left side.  CT of abdomen and pelvis obtained on 01/02/2023 demonstrated mild wall thickening of the ascending colon represent possible colitis, moderate right and a small left pleural effusion with atelectasis, indeterminant exophytic lesion in the left upper pole of the kidney measuring 12 mm, recommend nonemergent renal protocol MRI as outpatient.  During hospitalization, her heart rate increased to 120s to 130s on 01/11/2023.  She is currently on home dose of metoprolol tartrate 100 mg twice a day.  She is receiving IV potassium on top of oral potassium due to hypokalemia.  Patient received 40 mg IV Lasix and a 40 mg oral Lasix on 2/29 due to shortness of breath.  Talking with the patient today, her shortness of breath did improve however is still not at her baseline.  Service has seen the patient and is considering possible colonoscopy tomorrow.  Cardiology service consulted for permanent A-fib with RVR and mild volume overload.   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   Allergy    SEASONAL   Anemia    years ago   Anticoagulated by anticoagulation treatment - Eliquis 03/30/2014   Started May 2015    Anxiety state 10/13/2010   Qualifier: Diagnosis of  By: Nils Pyle CMA (AAMA), Mearl Latin     Arthritis    "all my joints" (03/31/2014)   Barrett's  esophagus    Breast cancer [facility])    s/p right breast lumpectomy   CARDIAC MURMUR 10/13/2010   Qualifier: Diagnosis of  By: Nils Pyle CMA (AAMA), Leisha     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   Cervical cancer ([place])    Chronic cough 01/02/2017   GERD, hiatal hernia Doubt amio   COLONIC POLYPS, ADENOMATOUS, HX OF  10/13/2010   Qualifier: Diagnosis of  By: Nils Pyle CMA (AAMA), Leisha     Degenerative disorder of eye 07/25/2012   Diabetes mellitus without complication ([place])    Dilated cardiomyopathy ([place]) 08/27/2018   DIVERTICULOSIS, COLON 10/13/2010   Qualifier: Diagnosis of  By: Nils Pyle CMA (AAMA), Leisha     DJD (degenerative joint disease) of knee    Dysrhythmia    Afib   Echocardiogram 07/2020    Echo 9/21: EF 70-75, moderate LVH, GR 1 DD, RVSP 40.5 (mildly elevated), normal RVSF, moderate LAE, mild RAE, mild MR, trivial AI   EROSIVE ESOPHAGITIS 10/13/2010   Qualifier: Diagnosis of  By: Nils Pyle CMA (AAMA), Leisha     Error, refractive, myopia 07/09/2012   Essential hypertension 10/13/2010   Qualifier: Diagnosis of  By: Nils Pyle CMA (AAMA), Rueben Bash, intermittent 07/09/2012   GERD 10/14/2010   Qualifier: Diagnosis of  By: Sharlett Iles MD Cline Cools R    GERD (gastroesophageal reflux disease)    Glaucoma    H/O hiatal hernia    Heart murmur    Hiatal hernia s/p robotic repair & fundoplication [id number]   Qualifier: Diagnosis of  By: Nils Pyle CMA (AAMA), Mearl Latin     History of laser assisted in situ keratomileusis 07/25/2012   Hx of transesophageal echocardiography (TEE) for monitoring    TEE (03/2014): No LAA clot, moderate LAE, core triatriatum type structure in RA with no stenosis, normal EF 55-60%, mild MR   HYPERCHOLESTEROLEMIA 10/13/2010   Qualifier: Diagnosis of  By: Nils Pyle CMA (AAMA), Leisha     Hypertension    Hypothyroidism, postsurgical    Iron Deficiency Anemia 10/14/2010   Qualifier: Diagnosis of  By: Sharlett Iles MD Cline Cools R    Irritable bowel syndrome 10/13/2010   Qualifier: Diagnosis of  By: Nils Pyle CMA (AAMA), Mearl Latin     Low serum potassium    Lumbar stenosis with neurogenic claudication 04/20/2016   Malignant neoplasm of breast ([place]) 02/25/2016   Migraine    "last one was years ago" (03/31/2014)   MIGRAINE HEADACHE 10/13/2010   Qualifier: Diagnosis of  By:  Nils Pyle CMA (AAMA), Leisha     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   Overview:  X(T)    OSA on CPAP    settings at 10-11    OSTEOARTHRITIS 10/13/2010   Qualifier: Diagnosis of  By: Nils Pyle CMA (AAMA), Leisha     Persistent atrial fibrillation 03/31/2015   DLCO dropped from 100% in 2015-50% in 01/2017   Post-surgical hypothyroidism 04/09/2014   RECTAL FISSURE 10/13/2010   Qualifier: Diagnosis of  By: Nils Pyle CMA (AAMA), Mearl Latin     S/P Nissen fundoplication (without gastrostomy tube) procedure 05/25/2017   Sleep apnea    THYROID CANCER 10/13/2010   Qualifier: Diagnosis of  By: Nils Pyle CMA Deborra Medina), Mearl Latin     Thyroid cancer [facility])    s/p thyroidectomy   Type II diabetes mellitus ([place])    type II     Past Surgical History:  Procedure Laterality Date   ABDOMINAL HYSTERECTOMY     "partial"   BREAST BIOPSY Right    BREAST LUMPECTOMY Right  CARDIOVERSION N/A 03/30/2014   Procedure: CARDIOVERSION - BEDSIDE;  Surgeon: [name], MD;  Location: [place];  Service: Cardiovascular;  Laterality: N/A;   CARDIOVERSION N/A 03/31/2014   Procedure: CARDIOVERSION  (BEDSIDE) ;  Surgeon: Lelon Perla, MD;  Location: [place];  Service: Cardiovascular;  Laterality: N/A;   CARDIOVERSION N/A 04/27/2014   Procedure: CARDIOVERSION;  Surgeon: Darlin Coco, MD;  Location: [place];  Service: Cardiovascular;  Laterality: N/A;   CARDIOVERSION N/A 04/13/2015   Procedure: CARDIOVERSION;  Surgeon: Thayer Headings, MD;  Location: [place];  Service: Cardiovascular;  Laterality: N/A;   CARDIOVERSION N/A 01/08/2017   Procedure: CARDIOVERSION;  Surgeon: Sanda Klein, MD;  Location: [facility] ENDOSCOPY;  Service: Cardiovascular;  Laterality: N/A;   CARDIOVERSION N/A 09/19/2017   Procedure: CARDIOVERSION;  Surgeon: [name], MD;  Location: [place];  Service: Cardiovascular;  Laterality: N/A;   CARDIOVERSION N/A 10/25/2017   Procedure: CARDIOVERSION;  Surgeon: Nahser, Wonda Cheng, MD;  Location: [place];   Service: Cardiovascular;  Laterality: N/A;   CARDIOVERSION N/A 12/27/2018   Procedure: CARDIOVERSION;  Surgeon: [name], MD;  Location: [place];  Service: Cardiovascular;  Laterality: N/A;   CARDIOVERSION N/A 05/20/2019   Procedure: CARDIOVERSION;  Surgeon: [name], MD;  Location: [place];  Service: Cardiovascular;  Laterality: N/A;   CARDIOVERSION N/A 12/10/2019   Procedure: CARDIOVERSION;  Surgeon: Pixie Casino, MD;  Location: [place];  Service: Cardiovascular;  Laterality: N/A;   CARDIOVERSION N/A 12/31/2019   Procedure: CARDIOVERSION;  Surgeon: Sanda Klein, MD;  Location: [place];  Service: Cardiovascular;  Laterality: N/A;   CARDIOVERSION N/A 10/21/2020   Procedure: CARDIOVERSION;  Surgeon: [name], MD;  Location: [place];  Service: Cardiovascular;  Laterality: N/A;   CARDIOVERSION N/A 12/03/2020   Procedure: CARDIOVERSION;  Surgeon: Jerline Pain, MD;  Location: [place];  Service: Cardiovascular;  Laterality: N/A;   CARDIOVERSION N/A 07/11/2021   Procedure: CARDIOVERSION;  Surgeon: Pixie Casino, MD;  Location: [facility] ENDOSCOPY;  Service: Cardiovascular;  Laterality: N/A;   CARPAL TUNNEL RELEASE Right    CATARACT EXTRACTION W/ INTRAOCULAR LENS  IMPLANT, BILATERAL Bilateral    COLONOSCOPY     EXCISIONAL HEMORRHOIDECTOMY     INSERTION OF MESH N/A 05/25/2017   Procedure: INSERTION OF MESH;  Surgeon: Ralene Ok, MD;  Location: [facility];  Service: General;  Laterality: N/A;   TEE WITHOUT CARDIOVERSION N/A 03/30/2014   Procedure: TRANSESOPHAGEAL ECHOCARDIOGRAM (TEE);  Surgeon: [name], MD;  Location: [facility] ENDOSCOPY;  Service: Cardiovascular;  Laterality: N/A;   THYROIDECTOMY     TONSILLECTOMY       Home Medications:  Prior to Admission medications   Medication Sig Start Date End Date Taking? Authorizing Provider  acetaminophen (TYLENOL) 650 MG CR tablet Take 650 mg by mouth in the morning and at bedtime.   Yes  [provider]  apixaban (ELIQUIS) 5 MG TABS tablet Take 1 tablet (5 mg total) by mouth 2 (two) times daily. 12/18/22  Yes Sherran Needs, NP  cetirizine (ZYRTEC) 10 MG tablet Take 10 mg by mouth daily.   Yes [provider]  cholecalciferol (VITAMIN D) 25 MCG (1000 UNIT) tablet Take 1,000 Units by mouth daily.   Yes [provider]  ferrous sulfate 325 (65 FE) MG tablet Take 325 mg by mouth daily with breakfast.   Yes [provider]  levothyroxine (SYNTHROID) 150 MCG tablet Take 150 mcg by mouth every morning. 12/25/22  Yes [provider]  lipase/protease/amylase (CREON) 36000 UNITS CPEP capsule Take 3 capsule with each meal and 2 capsule with snacks(up to 2 snacks daily) Patient taking differently: Take 72,000-108,000 Units by mouth See admin instructions. Take 108,000 units (3 capsules) with each meal and 72,000 units (2 capsules) with each snack - up to 2 snacks daily. 12/29/22  Yes Levin Erp, PA  loperamide (IMODIUM) 2 MG capsule Take 1 capsule (2 mg total) by mouth every 4 (four) hours. 12/27/22  Yes Levin Erp, PA  LORazepam (ATIVAN) 1 MG tablet Take 0.5 mg by mouth at bedtime. Take additional 0.5 mg if needed   Yes [provider]  metoprolol tartrate (LOPRESSOR) 100 MG tablet TAKE 1 TABLET(100 MG) BY MOUTH TWICE DAILY Patient taking differently: Take 100 mg by mouth 2 (two) times daily. 11/30/22  Yes Sherran Needs, NP  Polyvinyl Alcohol-Povidone PF 1.4-0.6 % SOLN Place 1 drop into both eyes daily as needed (dry eyes).    Yes [provider]  potassium chloride (KLOR-CON M) 10 MEQ tablet Take 10 mEq by mouth daily. 12/22/22  Yes [provider]  predniSONE (DELTASONE) 10 MG tablet Take 4 tablets (40 mg total) by mouth daily with breakfast for 3 days, THEN 3 tablets (30 mg total) daily with breakfast for 3 days, THEN 2 tablets (20 mg total) daily with breakfast for 14 days, THEN 1 tablet (10 mg  total) daily with breakfast for 14 days, THEN 0.5 tablets (5 mg total) daily with breakfast for 14 days. 01/09/23 02/26/23 Yes Gatha Mayer, MD  rosuvastatin (CRESTOR) 10 MG tablet Take 10 mg by mouth at bedtime.   Yes [provider]  sertraline (ZOLOFT) 50 MG tablet Take 50 mg by mouth daily.   Yes [provider]  vitamin B-12 (CYANOCOBALAMIN) 500 MCG tablet Take 500 mcg by mouth daily.   Yes [provider]  zinc gluconate 50 MG tablet Take 50 mg by mouth daily.   Yes [provider]  ACCU-CHEK GUIDE test strip  01/09/20   [provider]  Blood Glucose Monitoring Suppl (ACCU-CHEK GUIDE ME) w/Device KIT  01/09/20   [provider]    Inpatient Medications: Scheduled Meds:  heparin  5,000 Units Subcutaneous Q8H   levothyroxine  150 mcg Oral q morning   LORazepam  0.5 mg Oral QHS   methylPREDNISolone (SOLU-MEDROL) injection  40 mg Intravenous Daily   metoprolol tartrate  100 mg Oral BID   pantoprazole  40 mg Oral Daily   peg 3350 powder  0.5 kit Oral Once   And   peg 3350 powder  0.5 kit Oral Once   potassium chloride  40 mEq Oral BID   rosuvastatin  10 mg Oral QHS   sertraline  50 mg Oral Daily   sodium chloride flush  3 mL Intravenous Q12H   Continuous Infusions:  magnesium sulfate bolus IVPB     potassium chloride 10 mEq (01/12/23 0916)   PRN Meds: acetaminophen **OR** acetaminophen, metoprolol tartrate, ondansetron **OR** ondansetron (ZOFRAN) IV  Allergies:    Allergies  Allergen Reactions   Penicillins Itching, Rash and Other (See Comments)    Did it involve swelling of the face/tongue/throat, SOB, or low BP? No Did it involve sudden or severe rash/hives, skin peeling, or any reaction on the inside of your mouth or nose? No Did you need to seek medical attention at a hospital or doctor's office? No When did it last happen?    30+ years  If all above answers are "NO", may proceed with cephalosporin use.     Social  History:   Social History   Socioeconomic History   Marital status: Divorced    Spouse name: Not on file   Number of children: 2   Years of education: Not on file   Highest education level: Not on file  Occupational History   Occupation: Retired    Comment: Education officer, museum  Tobacco Use   Smoking status: Never   Smokeless tobacco: Never  Scientific laboratory technician Use: Never used  Substance and Sexual Activity   Alcohol use: No    Alcohol/week: 0.0 standard drinks of alcohol   Drug use: No   Sexual activity: Never  Other Topics Concern   Not on file  Social History Narrative   Not on file   Social Determinants of Health   Financial Resource Strain: Not on file  Food Insecurity: Not on file  Transportation Needs: Not on file  Physical Activity: Not on file  Stress: Not on file  Social Connections: Not on file  Intimate Partner Violence: Not on file    Family History:    Family History  Problem Relation Age of Onset   Prostate cancer Father    Diabetes Sister    Heart disease Sister    Heart attack Brother    Heart disease Brother    Diabetes Brother    Diabetes Sister    Diabetes Sister    Pancreatic cancer Sister    Diabetes Brother    Heart disease Brother    Diabetes Brother    Heart disease Brother    Diabetes Brother    Heart disease Brother    Diabetes Brother    Heart disease Brother    Diabetes Brother    Heart disease Brother    Diabetes Brother    Diabetes Child      ROS:  Please see the history of present illness.   All other ROS reviewed and negative.     Physical Exam/Data:   Vitals:   01/11/23 1451 01/11/23 1953 01/11/23 2215 01/12/23 0400  BP: (!) 121/97 127/81 (!) 140/90 123/81  Pulse: 96 82 94 (!) 104  Resp:  15  15  Temp:  (!) 97.5 F (36.4 C)  98.1 F (36.7 C)  TempSrc:  Oral  Oral  SpO2:  98%  98%  Weight:    68 kg  Height:        Intake/Output Summary (Last 24 hours) at 01/12/2023 0940 Last data filed at 01/12/2023  0402 Gross per 24 hour  Intake 400 ml  Output 1650 ml  Net -1250 ml      01/12/2023    4:00 AM 01/10/2023    3:29 PM  Last 3 Weights  Weight (lbs) 150 lb 147 lb 7.8 oz  Weight (kg) 68.04 kg 66.9 kg     Body mass index is 24.96 kg/m.  General:  Well nourished, well developed, in no acute distress HEENT: normal Neck: no JVD Vascular: No carotid bruits; Distal pulses 2+ bilaterally Cardiac:  normal S1, S2; RRR; no murmur  Lungs: Diminished breath sounds in the right base of the lung. Abd: soft, nontender, no hepatomegaly  Ext: no edema Musculoskeletal:  No deformities, BUE and BLE strength normal and equal Skin: warm and dry  Neuro:  CNs 2-12 intact, no focal abnormalities noted Psych:  Normal affect   EKG:  The EKG was personally reviewed and demonstrates: Atrial fibrillation, heart  rate in the 80s. Telemetry:  Telemetry was personally reviewed and demonstrates:  Atrial fibrillation, heart rate in the 110-120s yesterday, improved to around 100 this morning.  Relevant CV Studies:  Echo 07/21/2020  1. Left ventricular ejection fraction, by estimation, is 70 to 75%. The  left ventricle has hyperdynamic function. The left ventricle has no  regional wall motion abnormalities. There is moderate left ventricular  hypertrophy. Left ventricular diastolic  parameters are consistent with Grade I diastolic dysfunction (impaired  relaxation).   2. Right ventricular systolic function is normal. The right ventricular  size is normal. There is mildly elevated pulmonary artery systolic  pressure.   3. Left atrial size was moderately dilated.   4. Right atrial size was mildly dilated.   5. The mitral valve is grossly normal. Mild mitral valve regurgitation.  No evidence of mitral stenosis. Severe mitral annular calcification.   6. The aortic valve is tricuspid. There is moderate calcification of the  aortic valve. There is moderate thickening of the aortic valve. Aortic  valve regurgitation  is trivial. No aortic stenosis is present. Aortic  regurgitation PHT measures 474 msec.   7. The inferior vena cava is normal in size with greater than 50%  respiratory variability, suggesting right atrial pressure of 3 mmHg.   Laboratory Data:  High Sensitivity Troponin:   Recent Labs  Lab 01/11/23 0951  TROPONINIHS 7     Chemistry Recent Labs  Lab 01/10/23 1704 01/11/23 0540 01/12/23 0602  NA 136 134* 139  K 3.8 3.2* 3.0*  CL 100 100 100  CO2 '27 26 31  '$ GLUCOSE 182* 152* 114*  BUN '14 16 16  '$ CREATININE 0.85 0.63 0.68  CALCIUM 8.6* 8.3* 8.1*  MG  --   --  1.5*  GFRNONAA >60 >60 >60  ANIONGAP '9 8 8    '$ Recent Labs  Lab 01/10/23 1704 01/12/23 0602  PROT 6.3* 5.6*  ALBUMIN 3.1* 2.7*  AST 29 13*  ALT 14 11  ALKPHOS 55 48  BILITOT 1.2 0.6   Lipids No results for input(s): "CHOL", "TRIG", "HDL", "LABVLDL", "LDLCALC", "CHOLHDL" in the last 168 hours.  Hematology Recent Labs  Lab 01/10/23 1704 01/11/23 0540  WBC 5.3 5.5  RBC 3.49* 3.26*  HGB 11.4* 10.8*  HCT 35.2* 32.9*  MCV 100.9* 100.9*  MCH 32.7 33.1  MCHC 32.4 32.8  RDW 13.7 13.3  PLT 230 213   Thyroid  Recent Labs  Lab 01/11/23 0540  TSH 1.046    BNP Recent Labs  Lab 01/11/23 0951  BNP 468.9*    DDimer No results for input(s): "DDIMER" in the last 168 hours.   Radiology/Studies:  DG CHEST PORT 1 VIEW  Result Date: 01/11/2023 CLINICAL DATA:  Pleural effusion EXAM: PORTABLE CHEST 1 VIEW COMPARISON:  12/07/2022 FINDINGS: Prominent cardiac silhouette. Small pleural effusion on the left and moderate pleural effusion right. Pulmonary vascular congestion consistent with pulmonary edema. Mild bibasilar consolidation. Calcified tortuous aorta. Postop changes right axilla. IMPRESSION: Findings consistent with CHF. Moderate pleural effusion on the right and small pleural effusion on the left. Electronically Signed   By: Sammie Bench M.D.   On: 01/11/2023 09:06   CT ABDOMEN PELVIS W CONTRAST  Result  Date: 01/10/2023 CLINICAL DATA:  Bowel obstruction suspected Diarrhea and generalized weakness. EXAM: CT ABDOMEN AND PELVIS WITH CONTRAST TECHNIQUE: Multidetector CT imaging of the abdomen and pelvis was performed using the standard protocol following bolus administration of intravenous contrast. RADIATION DOSE REDUCTION: This exam was performed according  to the departmental dose-optimization program which includes automated exposure control, adjustment of the mA and/or kV according to patient size and/or use of iterative reconstruction technique. CONTRAST:  12m OMNIPAQUE IOHEXOL 300 MG/ML  SOLN COMPARISON:  CT 03/23/2022 FINDINGS: Lower chest: Moderate right and small left pleural effusion. Associated compressive atelectasis. Cardiomegaly with primarily right heart dilatation. Wall thickening of the distal esophagus with small hiatal hernia. Hepatobiliary: Stable small low-density lesions are too small to accurately characterize but likely cysts. No new liver lesions. Layering gallstones in the gallbladder. No pericholecystic inflammation. No biliary dilatation. Pancreas: Mild fatty atrophy.  No ductal dilatation or inflammation. Spleen: Normal in size without focal abnormality. Adrenals/Urinary Tract: No adrenal nodule. Symmetric prominence of both renal collecting systems without ureteral dilatation. There are bilateral simple cysts that need no further follow-up. Indeterminate exophytic lesion from the upper pole of the left kidney measures 12 mm, series 2, image 23, previously 9 mm. Partially distended urinary bladder. Stomach/Bowel: Moderate-sized hiatal hernia with wall thickening of the distal esophagus. Air in fluid within the stomach without gastric wall thickening. No small bowel obstruction or inflammatory change. Minimal fluid within distal small bowel loops in the pelvis. The appendix is not confidently seen. Mild wall thickening of the ascending colon, series 2, image 55. Formed stool in the  remainder of the ascending colon. Air-filled transverse colon that is mildly prominent. Mixed liquid and solid stool in the descending colon. Small amount of formed stool in the sigmoid colon. Mild sigmoid colonic diverticulosis without diverticulitis. Rectum is distended with air and a small amount of mixed liquid and solid stool. No obvious colonic mass. Vascular/Lymphatic: Aortic atherosclerosis and tortuosity. No aneurysm. No acute vascular findings. No adenopathy. Reproductive: Hysterectomy. No adnexal mass. Other: No ascites or free fluid. No free intra-abdominal air. There is generalized subcutaneous body wall edema. Fat in both inguinal canals. Musculoskeletal: Posterior L4-L5 fusion hardware. Degenerative change of both hips and the pubic symphysis. There are no acute or suspicious osseous abnormalities. IMPRESSION: 1. Mild wall thickening of the ascending colon, possible colitis. 2. Moderate-sized hiatal hernia with wall thickening of the distal esophagus, can be seen with reflux or esophagitis. 3. Moderate right and small left pleural effusions with compressive atelectasis. Generalized subcutaneous body wall edema. 4. Cholelithiasis without CT findings of acute cholecystitis. 5. Indeterminate exophytic lesion from the upper pole of the left kidney measures 12 mm, previously 9 mm. Recommend nonemergent renal protocol MRI for characterization after resolution of acute event, preferably as an outpatient when patient can tolerate breath hold technique. 6. Mild sigmoid colonic diverticulosis without diverticulitis. Aortic Atherosclerosis (ICD10-I70.0). Electronically Signed   By: MKeith RakeM.D.   On: 01/10/2023 21:15   Abd 1 View (KUB)  Result Date: 01/10/2023 CLINICAL DATA:  Chronic diarrhea. EXAM: ABDOMEN - 1 VIEW COMPARISON:  None Available. FINDINGS: Generalized gaseous distention of colon is seen without significant small bowel dilatation. Lumbar spine fusion hardware noted. IMPRESSION:  Generalized gaseous distention of colon. Differential diagnosis includes colonic ileus and distal colonic obstruction. Consider abdomen pelvis CT for further evaluation if clinically warranted. Electronically Signed   By: JMarlaine HindM.D.   On: 01/10/2023 19:53     Assessment and Plan:   Permanent atrial fibrillation with RVR  -Previously failed amiodarone and Tikosyn.  Has been on rate control with 100 mg twice a day of metoprolol tartrate at home.  Eliquis held during this admission in anticipation of possible colonoscopy.  Although she may be candidate for digoxin, however hesitant to  use given her advanced age and significant hypokalemia from GI loss.  -Heart rate is currently in the 100 range which is borderline elevated however not bad.  Hopefully with further diuresis, will see the heart rate continue to improve.  -Consider switch subcu heparin to IV heparin.  Acute on chronic diastolic heart failure: Mild volume overloaded with moderate pleural effusion on the right, mild pleural effusion on the left side.  Given 40 mg IV and 40 mg oral Lasix yesterday. I/O -1.2 L overnight.  No sign of pulmonary edema on exam today, still has a right pleural effusion, however left lung is clear.  Will give another 40 mg IV Lasix today.  Will hold off on further diuretic after today given her frequent diarrhea and electrolyte shift.  Hypokalemia: Due to frequent diarrhea  Frequent diarrhea: Followed by GI, planning follow-up possible colonoscopy tomorrow  Hypertension: Metoprolol tartrate 100 mg twice a day  Hyperlipidemia: On rosuvastatin  DM2   Risk Assessment/Risk Scores:        New York Heart Association (NYHA) Functional Class NYHA Class III  CHA2DS2-VASc Score = 5   This indicates a 7.2% annual risk of stroke. The patient's score is based upon: CHF History: 1 HTN History: 1 Diabetes History: 0 Stroke History: 0 Vascular Disease History: 0 Age Score: 2 Gender Score: 1          For questions or updates, please contact [place] Please consult [email or web address] for contact info under    Signed, Almyra Deforest, [place]  01/12/2023 9:40 AM  Patient seen and examined   I agree with findings as noted by Janan Ridge above   Pt is an 89 yo with permanent atrial fibrillation (failed amiodarone and Tikosyn), HFpEF, HTN, HL and diarrhea  Consulted for evaluation of afib with RVR and volume increase Since admit as noted above she has received IV lasix with some diuresis   Still not at baseline   ON exam pt is in NAD Lungs Decreased BS at R base  Otherwise clear  Cardiac   Irreg irreg  No S3   No signif murmurs Abd is supple  Ext   No LE edema  On tele HR 90s to 100s now  IMproved   REcomm As recomm in note above would give additional IV lasix 40 mg   Follow after that     HR is improved   Follow for now Currently on metoprolol 100 bid  Off of oral anticoagulant   Would reocmm IV heparin if prolonged time off expected.    Will follow   Dorris Carnes MD

## 2023-01-12 NOTE — Progress Notes (Signed)
  Echocardiogram 2D Echocardiogram has been performed.  [name] 01/12/2023, 1:49 PM

## 2023-01-12 NOTE — Progress Notes (Addendum)
[facility] Progress Note  CC:    Diarrhea, suspected colitis/IBD   Subjective: She feels better today.  Shortness of breath has improved when getting up to the bedside commode.  No chest pain. No nausea or vomiting.  No abdominal pain. She had a small amount of diarrhea last night but the specimen could not be collected.  Home CNA at bedside.   Objective:  Vital signs in last 24 hours: Temp:  [97.5 F (36.4 C)-98.1 F (36.7 C)] 98.1 F (36.7 C) (03/01 0400) Pulse Rate:  [82-125] 104 (03/01 0400) Resp:  [15-22] 15 (03/01 0400) BP: (121-143)/(81-97) 123/81 (03/01 0400) SpO2:  [79 %-98 %] 98 % (03/01 0400) Weight:  [68 kg] 68 kg (03/01 0400) Last BM Date : 01/11/23 General: Alert 89 year old female in no acute distress. Heart: Irregular rhythm, no murmurs. Pulm: Breath sounds clear throughout.  On oxygen 3 L nasal cannula. Abdomen: Nondistended.  Nontender.  Positive bowel sounds to all 4 quadrants. Extremities:  Without edema. Neurologic:  Alert and  oriented x 4. Grossly normal neurologically. Psych:  Alert and cooperative. Normal mood and affect.  Intake/Output from previous day: 02/29 0701 - 03/01 0700 In: 400 [IV Piggyback:400] Out: [id number] [Urine:1651; Stool:1] Intake/Output this shift: No intake/output data recorded.  Lab Results: Recent Labs    01/10/23 1704 01/11/23 0540  WBC 5.3 5.5  HGB 11.4* 10.8*  HCT 35.2* 32.9*  PLT 230 213   BMET Recent Labs    01/10/23 1704 01/11/23 0540 01/12/23 0602  NA 136 134* 139  K 3.8 3.2* 3.0*  CL 100 100 100  CO2 '27 26 31  '$ GLUCOSE 182* 152* 114*  BUN '14 16 16  '$ CREATININE 0.85 0.63 0.68  CALCIUM 8.6* 8.3* 8.1*   LFT Recent Labs    01/12/23 0602  PROT 5.6*  ALBUMIN 2.7*  AST 13*  ALT 11  ALKPHOS 48  BILITOT 0.6   PT/INR Recent Labs    01/10/23 1704  LABPROT 22.9*  INR 2.1*   Hepatitis Panel No results for input(s): "HEPBSAG", "HCVAB", "HEPAIGM", "HEPBIGM" in the last 72 hours.  DG  CHEST PORT 1 VIEW  Result Date: 01/11/2023 CLINICAL DATA:  Pleural effusion EXAM: PORTABLE CHEST 1 VIEW COMPARISON:  12/07/2022 FINDINGS: Prominent cardiac silhouette. Small pleural effusion on the left and moderate pleural effusion right. Pulmonary vascular congestion consistent with pulmonary edema. Mild bibasilar consolidation. Calcified tortuous aorta. Postop changes right axilla. IMPRESSION: Findings consistent with CHF. Moderate pleural effusion on the right and small pleural effusion on the left. Electronically Signed   By: Sammie Bench M.D.   On: 01/11/2023 09:06   CT ABDOMEN PELVIS W CONTRAST  Result Date: 01/10/2023 CLINICAL DATA:  Bowel obstruction suspected Diarrhea and generalized weakness. EXAM: CT ABDOMEN AND PELVIS WITH CONTRAST TECHNIQUE: Multidetector CT imaging of the abdomen and pelvis was performed using the standard protocol following bolus administration of intravenous contrast. RADIATION DOSE REDUCTION: This exam was performed according to the departmental dose-optimization program which includes automated exposure control, adjustment of the mA and/or kV according to patient size and/or use of iterative reconstruction technique. CONTRAST:  80m OMNIPAQUE IOHEXOL 300 MG/ML  SOLN COMPARISON:  CT 03/23/2022 FINDINGS: Lower chest: Moderate right and small left pleural effusion. Associated compressive atelectasis. Cardiomegaly with primarily right heart dilatation. Wall thickening of the distal esophagus with small hiatal hernia. Hepatobiliary: Stable small low-density lesions are too small to accurately characterize but likely cysts. No new liver lesions. Layering gallstones in the gallbladder. No pericholecystic  inflammation. No biliary dilatation. Pancreas: Mild fatty atrophy.  No ductal dilatation or inflammation. Spleen: Normal in size without focal abnormality. Adrenals/Urinary Tract: No adrenal nodule. Symmetric prominence of both renal collecting systems without ureteral  dilatation. There are bilateral simple cysts that need no further follow-up. Indeterminate exophytic lesion from the upper pole of the left kidney measures 12 mm, series 2, image 23, previously 9 mm. Partially distended urinary bladder. Stomach/Bowel: Moderate-sized hiatal hernia with wall thickening of the distal esophagus. Air in fluid within the stomach without gastric wall thickening. No small bowel obstruction or inflammatory change. Minimal fluid within distal small bowel loops in the pelvis. The appendix is not confidently seen. Mild wall thickening of the ascending colon, series 2, image 55. Formed stool in the remainder of the ascending colon. Air-filled transverse colon that is mildly prominent. Mixed liquid and solid stool in the descending colon. Small amount of formed stool in the sigmoid colon. Mild sigmoid colonic diverticulosis without diverticulitis. Rectum is distended with air and a small amount of mixed liquid and solid stool. No obvious colonic mass. Vascular/Lymphatic: Aortic atherosclerosis and tortuosity. No aneurysm. No acute vascular findings. No adenopathy. Reproductive: Hysterectomy. No adnexal mass. Other: No ascites or free fluid. No free intra-abdominal air. There is generalized subcutaneous body wall edema. Fat in both inguinal canals. Musculoskeletal: Posterior L4-L5 fusion hardware. Degenerative change of both hips and the pubic symphysis. There are no acute or suspicious osseous abnormalities. IMPRESSION: 1. Mild wall thickening of the ascending colon, possible colitis. 2. Moderate-sized hiatal hernia with wall thickening of the distal esophagus, can be seen with reflux or esophagitis. 3. Moderate right and small left pleural effusions with compressive atelectasis. Generalized subcutaneous body wall edema. 4. Cholelithiasis without CT findings of acute cholecystitis. 5. Indeterminate exophytic lesion from the upper pole of the left kidney measures 12 mm, previously 9 mm. Recommend  nonemergent renal protocol MRI for characterization after resolution of acute event, preferably as an outpatient when patient can tolerate breath hold technique. 6. Mild sigmoid colonic diverticulosis without diverticulitis. Aortic Atherosclerosis (ICD10-I70.0). Electronically Signed   By: Keith Rake M.D.   On: 01/10/2023 21:15   Abd 1 View (KUB)  Result Date: 01/10/2023 CLINICAL DATA:  Chronic diarrhea. EXAM: ABDOMEN - 1 VIEW COMPARISON:  None Available. FINDINGS: Generalized gaseous distention of colon is seen without significant small bowel dilatation. Lumbar spine fusion hardware noted. IMPRESSION: Generalized gaseous distention of colon. Differential diagnosis includes colonic ileus and distal colonic obstruction. Consider abdomen pelvis CT for further evaluation if clinically warranted. Electronically Signed   By: Marlaine Hind M.D.   On: 01/10/2023 19:53    Assessment / Plan:  89 year old female with progressive chronic diarrhea with elevated fecal calprotectin and positive lactoferrin level. Admitted to the hospital 01/10/2023 for inpatient colonoscopy, rule out colitis/IBD. CTAP showed mild wall thickening to the ascending colon concerning for colitis. No abdominal pain. She had one episode of diarrhea last night.  -Nursing staff to collect a stool specimen with next BM to check C. difficile PCR -Continue Solu-Medrol IV 40 mg IV daily for now -Resume heart healthy diet  -NPO after midnight -Colonoscopy possibly Sunday or Monday benefits and risks discussed including risk with sedation, risk of bleeding, perforation and infection  -Will need to hold IV heparin 6 hours before procedure  -CBC, BMP in am -Await further recommendations per Dr. Fuller Plan   History of chronic diastolic CHF with acute CHF.  Chest x-ray today consistent with CHF with a moderate pleural effusion on  the right and a small pleural effusion on the left.  LV EF 70 to 75% with grade 1 diastolic dysfunction per ECHO  07/2020. BNP 468.9. IV fluids dc'd. Received IV Lasix 2/29 and today. -Cardiology following   Atrial fibrillation. CHA2DS2-VASc Score = 5  On Metoprolol and Eliquis.  Last dose of Eliquis was 8 AM on 2/28. -Continue to hold Eliquis -Heparin SQ as switched to Heparin IV infusion today per cardiology   Hypokalemia. K+ 3.0.  Hypomagnesia. Magnesium level 1.5.  -KCL and magnesium replacement per the hospitalist   Macrocytic anemia. No overt GI bleeding. Hg 11.4 -> 10.8, likely dilutional. -CBC, iron panel and B12 level in a.m.   History of GERD, Barrett's esophagus s/p hiatal hernia repair and Nissen fundoplication [id number].  CTAP showed a moderate size hiatal hernia with wall thickening to the distal esophagus consistent with reflux esophagitis. -Pantoprazole 40 mg p.o. daily   Indeterminate exophytic lesion to the left kidney per CT -Follow-up with PCP as an outpatient     LOS: 1 day   [name]  01/12/2023, 9:07AM   Attending Physician Note   I have taken an interval history, reviewed the chart and examined the patient. I performed a substantive portion of this encounter, including complete performance of at least one of the key components, in conjunction with the APP. I agree with the APP's note, impression and recommendations with my edits.   Lucio Edward, MD [facility] See AMION, [facility], for our on call provider

## 2023-01-12 NOTE — Progress Notes (Incomplete)
Pharmacy: Re- Heparin  Patient is an 89 y.o F with hx afib on Eliquis PTA who was instructed by her gastroenterologist to come to the ED on 01/10/23 for further work-up and treatment of persistent diarrhea. Eliquis held on admission in anticipation for colonoscopy. Pharmacy was consulted on 01/12/23 to start heparin drip while Eliquis is on hold.  - last dose of Eliquis taken on 01/10/23 at 0800  Today, 01/12/2023: - first heparin level, aPTT - Elevated heparin level is likely due to the residual effect of Eliquis. Will monitor and adjust heparin drip using aPTT for now. Once heparin and aPTT levels correlate, then will use heparin level alone for adjusting drip.   Goal of Therapy:  Heparin level 0.3-0.7 units/ml aPTT level 66-102 sec Monitor platelets by anticoagulation protocol: Yes  Plan: - heparin -  - monitor for s/sx bleeding

## 2023-01-12 NOTE — Progress Notes (Signed)
PROGRESS NOTE   [name]  [id number] DOB: 11/14/1930 DOA: 01/10/2023 PCP: Corliss Blacker, MD  Brief Narrative:  27 wf Perm AFIB (since 2016) Chad2vasc2>4 [failed tikosyn/amiodarone--DCCV several times]--on BB --see OV note 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 Carles Collet in 2019 L4-L5 lumbar decompression in 2017 by neurosurgeon Dr. Sherwood Gambler  Recent Hospitalized 1/29-->12/14/22 Fall-stools up to 5 times a day with outpatient workup subsequently showing positive lactoferrin suggestive of inflammatory disorders noninfective Metformin was discontinued secondary to the diarrhea as there was a concern for this being a class effect of the metformin  presented from Dr. Carlean Purl office visit 2/20 as per his note with subacute presentation of diarrhea several times a day (up to 6-explosive-watery) Had been on Creon with no improvement Caregiver who has been with her since last hospitalization describes cough for the past 96 hours dry No blood in stool  Bolus IVF in ED Dr. Lorenso Courier GI consulted ---gave IV Solu-Medrol Overnight 2/29 patient became more tachycardic dyspneic and developed new oxygen requirement  3/1 cardiology consulted, echo pending  Hospital-Problem based course  acute hypoxic respiratory failure from [place]  -Hypoxia improved today after several rounds of Lasix, desat screen in a.m. - Appreciate cardiology input-current meds metoprolol 100 twice daily, received IV Lasix until 3/1 AM and then stop - Echocardiogram pending from 3/1  Colitis, prior concern for GERD Barrett's esophagus ---GI questions whether this could be inflammatory bowel disease - Per GI, steroids on board Solu-Medrol daily 40 mg - Checking C. difficile again? - Going for colonoscopy tomorrow?   Hopefully will be hemodynamically stable?!  Hypokalemia/hypomagnesemia - Replacing aggressively: K-Dur 40 twice daily, KCl 6 runs - 3 g magnesium IV  A-fib CHADVASC >5 last Eliquis dose 8 AM 2/28 -Rate controlled at this time only with metoprolol as above - Not really a great candidate for digoxin at age 89 - Further care as per them  Prior thyroid cancer -Looks like she saw Dr. Ralene Ok in the remote past circa 2009 -TSH 4 weeks ago was about 23 and today is 1.04 -She will continue Synthroid 150 every morning  Exophytic kidney lesion 12 mm up from 9 mm previously - Outpatient MR protocol kidney when patient can hold breath  DVT prophylaxis: Heparin prophylactic Code Status: Full  Family Communication: As above Disposition:  Status is: Observation The patient remains OBS appropriate and will d/c before 2 midnights.   Subjective:  Looks well feels fair no distress less SOB DOE-still has not really gotten up  Objective: Vitals:   01/11/23 1451 01/11/23 1953 01/11/23 2215 01/12/23 0400  BP: (!) 121/97 127/81 (!) 140/90 123/81  Pulse: 96 82 94 (!) 104  Resp:  15  15  Temp:  (!) 97.5 F (36.4 C)  98.1 F (36.7 C)  TempSrc:  Oral  Oral  SpO2:  98%  98%  Weight:    68 kg  Height:        Intake/Output Summary (Last 24 hours) at 01/12/2023 1424 Last data filed at 01/12/2023 1252 Gross per 24 hour  Intake 1051 ml  Output 1650 ml  Net -599 ml    Filed Weights   01/10/23 1529 01/12/23 0400  Weight: 66.9 kg 68 kg    Examination:  EOMI NCAT no focal deficit-seems hard of hearing  chest seems bilaterally diminished posterolaterally S1-S2  and rate controlled A-fib but does have runs of sinus tachycardia NSVT up to about 30 Abdomen is soft   Data Reviewed: personally reviewed   CBC    Component Value Date/Time   WBC 5.5 01/11/2023 0540   RBC 3.26 (L) 01/11/2023 0540   HGB 10.8 (L) 01/11/2023 0540   HGB 13.3 01/10/2021 1133   HGB 12.6 04/14/2009 1107   HCT 32.9  (L) 01/11/2023 0540   HCT 39.2 01/10/2021 1133   HCT 36.9 04/14/2009 1107   PLT 213 01/11/2023 0540   PLT 193 01/10/2021 1133   MCV 100.9 (H) 01/11/2023 0540   MCV 93 01/10/2021 1133   MCV 89.5 04/14/2009 1107   MCH 33.1 01/11/2023 0540   MCHC 32.8 01/11/2023 0540   RDW 13.3 01/11/2023 0540   RDW 13.2 01/10/2021 1133   RDW 13.4 04/14/2009 1107   LYMPHSABS 0.6 (L) 01/10/2023 1704   LYMPHSABS 1.3 01/10/2021 1133   LYMPHSABS 1.0 04/14/2009 1107   MONOABS 0.2 01/10/2023 1704   MONOABS 0.4 04/14/2009 1107   EOSABS 0.0 01/10/2023 1704   EOSABS 0.3 01/10/2021 1133   BASOSABS 0.0 01/10/2023 1704   BASOSABS 0.0 01/10/2021 1133   BASOSABS 0.0 04/14/2009 1107      Latest Ref Rng & Units 01/12/2023    6:02 AM 01/11/2023    5:40 AM 01/10/2023    5:04 PM  CMP  Glucose 70 - 99 mg/dL 114  152  182   BUN 8 - 23 mg/dL '16  16  14   '$ Creatinine 0.44 - 1.00 mg/dL 0.68  0.63  0.85   Sodium 135 - 145 mmol/L 139  134  136   Potassium 3.5 - 5.1 mmol/L 3.0  3.2  3.8   Chloride 98 - 111 mmol/L 100  100  100   CO2 22 - 32 mmol/L '31  26  27   '$ Calcium 8.9 - 10.3 mg/dL 8.1  8.3  8.6   Total Protein 6.5 - 8.1 g/dL 5.6   6.3   Total Bilirubin 0.3 - 1.2 mg/dL 0.6   1.2   Alkaline Phos 38 - 126 U/L 48   55   AST 15 - 41 U/L 13   29   ALT 0 - 44 U/L 11   14      Radiology Studies: DG CHEST PORT 1 VIEW  Result Date: 01/11/2023 CLINICAL DATA:  Pleural effusion EXAM: PORTABLE CHEST 1 VIEW COMPARISON:  12/07/2022 FINDINGS: Prominent cardiac silhouette. Small pleural effusion on the left and moderate pleural effusion right. Pulmonary vascular congestion consistent with pulmonary edema. Mild bibasilar consolidation. Calcified tortuous aorta. Postop changes right axilla. IMPRESSION: Findings consistent with CHF. Moderate pleural effusion on the right and small pleural effusion on the left. Electronically Signed   By: Sammie Bench M.D.   On: 01/11/2023 09:06   CT ABDOMEN PELVIS W CONTRAST  Result Date:  01/10/2023 CLINICAL DATA:  Bowel obstruction suspected Diarrhea and generalized weakness. EXAM: CT ABDOMEN AND PELVIS WITH CONTRAST TECHNIQUE: Multidetector CT imaging of the abdomen and pelvis was performed using the standard protocol following bolus administration of intravenous contrast. RADIATION DOSE REDUCTION: This exam was performed according to the departmental dose-optimization program which includes automated exposure control, adjustment of the mA and/or kV according to patient size and/or use of iterative reconstruction technique. CONTRAST:  85m OMNIPAQUE IOHEXOL 300 MG/ML  SOLN COMPARISON:  CT 03/23/2022 FINDINGS: Lower chest: Moderate right and small left pleural effusion. Associated compressive atelectasis. Cardiomegaly with primarily right heart dilatation.  Wall thickening of the distal esophagus with small hiatal hernia. Hepatobiliary: Stable small low-density lesions are too small to accurately characterize but likely cysts. No new liver lesions. Layering gallstones in the gallbladder. No pericholecystic inflammation. No biliary dilatation. Pancreas: Mild fatty atrophy.  No ductal dilatation or inflammation. Spleen: Normal in size without focal abnormality. Adrenals/Urinary Tract: No adrenal nodule. Symmetric prominence of both renal collecting systems without ureteral dilatation. There are bilateral simple cysts that need no further follow-up. Indeterminate exophytic lesion from the upper pole of the left kidney measures 12 mm, series 2, image 23, previously 9 mm. Partially distended urinary bladder. Stomach/Bowel: Moderate-sized hiatal hernia with wall thickening of the distal esophagus. Air in fluid within the stomach without gastric wall thickening. No small bowel obstruction or inflammatory change. Minimal fluid within distal small bowel loops in the pelvis. The appendix is not confidently seen. Mild wall thickening of the ascending colon, series 2, image 55. Formed stool in the remainder of  the ascending colon. Air-filled transverse colon that is mildly prominent. Mixed liquid and solid stool in the descending colon. Small amount of formed stool in the sigmoid colon. Mild sigmoid colonic diverticulosis without diverticulitis. Rectum is distended with air and a small amount of mixed liquid and solid stool. No obvious colonic mass. Vascular/Lymphatic: Aortic atherosclerosis and tortuosity. No aneurysm. No acute vascular findings. No adenopathy. Reproductive: Hysterectomy. No adnexal mass. Other: No ascites or free fluid. No free intra-abdominal air. There is generalized subcutaneous body wall edema. Fat in both inguinal canals. Musculoskeletal: Posterior L4-L5 fusion hardware. Degenerative change of both hips and the pubic symphysis. There are no acute or suspicious osseous abnormalities. IMPRESSION: 1. Mild wall thickening of the ascending colon, possible colitis. 2. Moderate-sized hiatal hernia with wall thickening of the distal esophagus, can be seen with reflux or esophagitis. 3. Moderate right and small left pleural effusions with compressive atelectasis. Generalized subcutaneous body wall edema. 4. Cholelithiasis without CT findings of acute cholecystitis. 5. Indeterminate exophytic lesion from the upper pole of the left kidney measures 12 mm, previously 9 mm. Recommend nonemergent renal protocol MRI for characterization after resolution of acute event, preferably as an outpatient when patient can tolerate breath hold technique. 6. Mild sigmoid colonic diverticulosis without diverticulitis. Aortic Atherosclerosis (ICD10-I70.0). Electronically Signed   By: Keith Rake M.D.   On: 01/10/2023 21:15   Abd 1 View (KUB)  Result Date: 01/10/2023 CLINICAL DATA:  Chronic diarrhea. EXAM: ABDOMEN - 1 VIEW COMPARISON:  None Available. FINDINGS: Generalized gaseous distention of colon is seen without significant small bowel dilatation. Lumbar spine fusion hardware noted. IMPRESSION: Generalized gaseous  distention of colon. Differential diagnosis includes colonic ileus and distal colonic obstruction. Consider abdomen pelvis CT for further evaluation if clinically warranted. Electronically Signed   By: Marlaine Hind M.D.   On: 01/10/2023 19:53     Scheduled Meds:  levothyroxine  150 mcg Oral q morning   LORazepam  0.5 mg Oral QHS   methylPREDNISolone (SOLU-MEDROL) injection  40 mg Intravenous Daily   metoprolol tartrate  100 mg Oral BID   pantoprazole  40 mg Oral Daily   peg 3350 powder  0.5 kit Oral Once   And   peg 3350 powder  0.5 kit Oral Once   potassium chloride  40 mEq Oral BID   rosuvastatin  10 mg Oral QHS   sertraline  50 mg Oral Daily   sodium chloride flush  3 mL Intravenous Q12H   Continuous Infusions:  heparin 1,050 Units/hr (01/12/23 1305)  potassium chloride Stopped (01/12/23 1341)     LOS: 1 day   Time spent: [place], MD [facility] To contact the attending provider between 7A-7P or the covering provider during after hours 7P-7A, please log into the web site [email or web address] and access using universal [place] password for that web site. If you do not have the password, please call the hospital operator.  01/12/2023, 2:24 PM

## 2023-01-13 ENCOUNTER — Encounter (HOSPITAL_COMMUNITY)
Admission: EM | Disposition: A | Discharge status: Home Health | Payer: Self-pay | Source: Home / Self Care | Attending: Family Medicine

## 2023-01-13 DIAGNOSIS — I482 Chronic atrial fibrillation, unspecified: Secondary | ICD-10-CM | POA: Diagnosis not present

## 2023-01-13 DIAGNOSIS — I5033 Acute on chronic diastolic (congestive) heart failure: Secondary | ICD-10-CM

## 2023-01-13 DIAGNOSIS — R197 Diarrhea, unspecified: Secondary | ICD-10-CM | POA: Diagnosis not present

## 2023-01-13 LAB — BASIC METABOLIC PANEL
Anion gap: 10 (ref 5–15)
BUN: 17 mg/dL (ref 8–23)
CO2: 31 mmol/L (ref 22–32)
Calcium: 8.6 mg/dL — ABNORMAL LOW (ref 8.9–10.3)
Chloride: 100 mmol/L (ref 98–111)
Creatinine, Ser: 0.67 mg/dL (ref 0.44–1.00)
GFR, Estimated: >60 mL/min (ref >60)
Glucose, Bld: 111 mg/dL — ABNORMAL HIGH (ref 70–99)
Potassium: 4 mmol/L (ref 3.5–5.1)
Sodium: 141 mmol/L (ref 135–145)

## 2023-01-13 LAB — CBC
HCT: 33.9 % — ABNORMAL LOW (ref 36.0–46.0)
Hemoglobin: 10.9 g/dL — ABNORMAL LOW (ref 12.0–15.0)
MCH: 32.8 pg (ref 26.0–34.0)
MCHC: 32.2 g/dL (ref 30.0–36.0)
MCV: 102.1 fL — ABNORMAL HIGH (ref 80.0–100.0)
Platelets: 239 10*3/uL (ref 150–400)
RBC: 3.32 MIL/uL — ABNORMAL LOW (ref 3.87–5.11)
RDW: 13.4 % (ref 11.5–15.5)
WBC: 7.2 10*3/uL (ref 4.0–10.5)
nRBC: 0 % (ref 0.0–0.2)

## 2023-01-13 LAB — GLUCOSE, CAPILLARY
Glucose-Capillary: 107 mg/dL — ABNORMAL HIGH (ref 70–99)
Glucose-Capillary: 202 mg/dL — ABNORMAL HIGH (ref 70–99)
Glucose-Capillary: 191 mg/dL — ABNORMAL HIGH (ref 70–99)
Glucose-Capillary: 200 mg/dL — ABNORMAL HIGH (ref 70–99)

## 2023-01-13 LAB — IRON AND TIBC
Iron: 52 ug/dL (ref 28–170)
Saturation Ratios: 21 % (ref 10.4–31.8)
TIBC: 244 ug/dL — ABNORMAL LOW (ref 250–450)
UIBC: 192 ug/dL

## 2023-01-13 LAB — APTT
aPTT: 157 seconds — ABNORMAL HIGH (ref 24–36)
aPTT: 73 seconds — ABNORMAL HIGH (ref 24–36)

## 2023-01-13 LAB — MAGNESIUM: Magnesium: 1.6 mg/dL — ABNORMAL LOW (ref 1.7–2.4)

## 2023-01-13 LAB — VITAMIN B12: Vitamin B-12: 1335 pg/mL — ABNORMAL HIGH (ref 180–914)

## 2023-01-13 LAB — FERRITIN: Ferritin: 58 ng/mL (ref 11–307)

## 2023-01-13 LAB — HEPARIN LEVEL (UNFRACTIONATED): Heparin Unfractionated: 0.87 IU/mL — ABNORMAL HIGH (ref 0.30–0.70)

## 2023-01-13 SURGERY — COLONOSCOPY WITH PROPOFOL
Anesthesia: Monitor Anesthesia Care

## 2023-01-13 MED ORDER — MAGNESIUM SULFATE 2 GM/50ML IV SOLN
2.0000 g | Freq: Once | INTRAVENOUS | Status: AC
  Administered 2023-01-13: 2 g via INTRAVENOUS
  Filled 2023-01-13: qty 50

## 2023-01-13 MED ORDER — HEPARIN (PORCINE) 25000 UT/250ML-% IV SOLN
900.0000 [IU]/h | INTRAVENOUS | Status: DC
  Administered 2023-01-13 – 2023-01-14 (×2): 900 [IU]/h via INTRAVENOUS
  Filled 2023-01-13 (×2): qty 250

## 2023-01-13 MED ORDER — PROSOURCE PLUS PO LIQD
30.0000 mL | Freq: Three times a day (TID) | ORAL | Status: DC
  Administered 2023-01-13 – 2023-01-16 (×7): 30 mL via ORAL
  Filled 2023-01-13 (×7): qty 30

## 2023-01-13 MED ORDER — ADULT MULTIVITAMIN W/MINERALS CH
1.0000 | ORAL_TABLET | Freq: Every day | ORAL | Status: DC
  Administered 2023-01-13 – 2023-01-16 (×3): 1 via ORAL
  Filled 2023-01-13 (×3): qty 1

## 2023-01-13 NOTE — Progress Notes (Signed)
Rounding Note    Patient Name: [name] Date of Encounter: 01/13/2023  [place] Cardiologist: Sherren Mocha, MD   Subjective   BP 130/90.  SpO2 99% on 2L [place].  Cr 0.67, Hgb 10.9.  I/Os not recorded.  Reports diarrhea improving.  Denies any chest pain.  Reports some dyspnea.  Inpatient Medications    Scheduled Meds:  levothyroxine  150 mcg Oral q morning   LORazepam  0.5 mg Oral QHS   methylPREDNISolone (SOLU-MEDROL) injection  40 mg Intravenous Daily   metoprolol tartrate  100 mg Oral BID   pantoprazole  40 mg Oral Daily   potassium chloride  40 mEq Oral BID   rosuvastatin  10 mg Oral QHS   sertraline  50 mg Oral Daily   sodium chloride flush  3 mL Intravenous Q12H   Continuous Infusions:  PRN Meds: acetaminophen **OR** acetaminophen, metoprolol tartrate, ondansetron **OR** ondansetron (ZOFRAN) IV   Vital Signs    Vitals:   01/12/23 1459 01/12/23 1922 01/13/23 0500 01/13/23 0504  BP: 134/60 119/86  (!) 130/90  Pulse: 98 89  97  Resp: '16 16  15  '$ Temp: 98.1 F (36.7 C) (!) 97.3 F (36.3 C)  98.2 F (36.8 C)  TempSrc:  Oral    SpO2: 96% 97%  99%  Weight:   63.4 kg   Height:        Intake/Output Summary (Last 24 hours) at 01/13/2023 0954 Last data filed at 01/12/2023 1512 Gross per 24 hour  Intake 600.04 ml  Output --  Net 600.04 ml      01/13/2023    5:00 AM 01/12/2023    4:00 AM 01/10/2023    3:29 PM  Last 3 Weights  Weight (lbs) 139 lb 11.2 oz 150 lb 147 lb 7.8 oz  Weight (kg) 63.368 kg 68.04 kg 66.9 kg      Telemetry    Afib 100-110s - Personally Reviewed  ECG    NO new ECG - Personally Reviewed  Physical Exam   GEN: No acute distress.   Neck: Minimal JVD Cardiac: tachycardic, irregular no murmurs, rubs, or gallops.  Respiratory: Clear to auscultation bilaterally. GI: Soft, nontender, non-distended  MS: No edema; No deformity. Neuro:  Nonfocal  Psych: Normal affect   Labs    High Sensitivity Troponin:   Recent Labs   Lab 01/11/23 0951  TROPONINIHS 7     Chemistry Recent Labs  Lab 01/10/23 1704 01/11/23 0540 01/12/23 0602 01/13/23 0543  NA 136 134* 139 141  K 3.8 3.2* 3.0* 4.0  CL 100 100 100 100  CO2 '27 26 31 31  '$ GLUCOSE 182* 152* 114* 111*  BUN '14 16 16 17  '$ CREATININE 0.85 0.63 0.68 0.67  CALCIUM 8.6* 8.3* 8.1* 8.6*  MG  --   --  1.5* 1.6*  PROT 6.3*  --  5.6*  --   ALBUMIN 3.1*  --  2.7*  --   AST 29  --  13*  --   ALT 14  --  11  --   ALKPHOS 55  --  48  --   BILITOT 1.2  --  0.6  --   GFRNONAA >60 >60 >60 >60  ANIONGAP '9 8 8 10    '$ Lipids No results for input(s): "CHOL", "TRIG", "HDL", "LABVLDL", "LDLCALC", "CHOLHDL" in the last 168 hours.  Hematology Recent Labs  Lab 01/10/23 1704 01/11/23 0540 01/13/23 0543  WBC 5.3 5.5 7.2  RBC 3.49* 3.26* 3.32*  HGB 11.4* 10.8* 10.9*  HCT 35.2* 32.9* 33.9*  MCV 100.9* 100.9* 102.1*  MCH 32.7 33.1 32.8  MCHC 32.4 32.8 32.2  RDW 13.7 13.3 13.4  PLT 230 213 239   Thyroid  Recent Labs  Lab 01/11/23 0540  TSH 1.046    BNP Recent Labs  Lab 01/11/23 0951  BNP 468.9*    DDimer No results for input(s): "DDIMER" in the last 168 hours.   Radiology    ECHOCARDIOGRAM COMPLETE  Result Date: 01/12/2023    ECHOCARDIOGRAM REPORT   Patient Name:   [name] Date of Exam: 01/12/2023 Medical Rec #:  [id number]        Height:       65.0 in Accession #:    [id number]       Weight:       150.0 lb Date of Birth:  1930-11-28         BSA:          1.750 m Patient Age:    63 years         BP:           123/81 mmHg Patient Gender: F                HR:           102 bpm. Exam Location:  Inpatient Procedure: 2D Echo, Color Doppler and Cardiac Doppler Indications:    Congestive Heart Failure I50.9  History:        Patient has prior history of Echocardiogram examinations, most                 recent 07/21/2020. Arrythmias:Atrial Fibrillation,                 Signs/Symptoms:Murmur; Risk Factors:Non-Smoker, Hypertension,                 Sleep Apnea and  Diabetes.  Sonographer:    Greer Pickerel Referring Phys: [phone] [facility]  Sonographer Comments: No subcostal window. Image acquisition challenging due to patient body habitus and Image acquisition challenging due to respiratory motion. IMPRESSIONS  1. Left ventricular ejection fraction, by estimation, is 65%. The left ventricle has normal function. Left ventricular endocardial border not optimally defined to evaluate regional wall motion. There is moderate left ventricular hypertrophy of the basal-septal segment. Left ventricular diastolic parameters are indeterminate.  2. Right ventricular systolic function is normal. The right ventricular size is normal.  3. Left atrial size was severely dilated.  4. Right atrial size was mildly dilated.  5. The mitral valve is degenerative. Mild mitral valve regurgitation. Moderate mitral annular calcification.  6. Tricuspid valve regurgitation is mild to moderate.  7. The aortic valve is tricuspid. Aortic valve regurgitation is mild.  8. Aortic dilatation noted. There is mild dilatation of the aortic root, measuring 40 mm. FINDINGS  Left Ventricle: Left ventricular ejection fraction, by estimation, is 65%. The left ventricle has normal function. Left ventricular endocardial border not optimally defined to evaluate regional wall motion. The left ventricular internal cavity size was normal in size. There is moderate left ventricular hypertrophy of the basal-septal segment. Left ventricular diastolic parameters are indeterminate. Right Ventricle: The right ventricular size is normal. No increase in right ventricular wall thickness. Right ventricular systolic function is normal. Left Atrium: Left atrial size was severely dilated. Right Atrium: Right atrial size was mildly dilated. Pericardium: There is no evidence of pericardial effusion. Mitral Valve: The mitral valve is degenerative in appearance. Moderate  mitral annular calcification. Mild mitral valve regurgitation.  Tricuspid Valve: The tricuspid valve is grossly normal. Tricuspid valve regurgitation is mild to moderate. No evidence of tricuspid stenosis. Aortic Valve: The aortic valve is tricuspid. Aortic valve regurgitation is mild. Aortic regurgitation PHT measures 545 msec. Pulmonic Valve: The pulmonic valve was not well visualized. Pulmonic valve regurgitation is trivial. No evidence of pulmonic stenosis. Aorta: Aortic dilatation noted. There is mild dilatation of the aortic root, measuring 40 mm. IAS/Shunts: The interatrial septum was not well visualized.  LEFT VENTRICLE PLAX 2D LVIDd:         4.10 cm   Diastology LVIDs:         2.70 cm   LV e' medial:    5.78 cm/s LV PW:         1.00 cm   LV E/e' medial:  22.1 LV IVS:        1.10 cm   LV e' lateral:   6.83 cm/s LVOT diam:     1.60 cm   LV E/e' lateral: 18.7 LV SV:         23 LV SV Index:   13 LVOT Area:     2.01 cm  LEFT ATRIUM              Index        RIGHT ATRIUM           Index LA diam:        4.30 cm  2.46 cm/m   RA Area:     25.20 cm LA Vol (A2C):   120.0 ml 68.55 ml/m  RA Volume:   70.00 ml  39.99 ml/m LA Vol (A4C):   148.0 ml 84.55 ml/m LA Biplane Vol: 147.0 ml 83.98 ml/m  AORTIC VALVE             PULMONIC VALVE LVOT Vmax:   66.60 cm/s  PR End Diast Vel: 2.29 msec LVOT Vmean:  55.100 cm/s LVOT VTI:    0.115 m AI PHT:      545 msec  AORTA Ao Root diam: 4.00 cm MITRAL VALVE                TRICUSPID VALVE MV Area (PHT): 5.62 cm     TR Peak grad:   30.7 mmHg MV Decel Time: 135 msec     TR Vmax:        277.00 cm/s MR Peak grad: 89.1 mmHg MR Vmax:      472.00 cm/s   SHUNTS MV E velocity: 128.00 cm/s  Systemic VTI:  0.12 m                             Systemic Diam: 1.60 cm Rudean Haskell MD Electronically signed by Rudean Haskell MD Signature Date/Time: 01/12/2023/2:27:19 PM    Final     Cardiac Studies     Patient Profile     89 y.o. female with history of permanent atrial fibrillation, chronic diastolic heart failure, [id number], hypertension,  hypothyroidism we are consulted for evaluation of atrial fibrillation with RVR  Assessment & Plan     Permanent atrial fibrillation: Previously failed amiodarone and Tikosyn.  On metoprolol 100 mg twice daily for rate control at home and Eliquis for anticoagulation.  Presented with A-fib with RVR. -GI planning colonoscopy.  Eliquis held on, started IV heparin -Continue metoprolol 100 mg twice daily, rates appear reasonably controlled  Acute on chronic diastolic heart failure:  Given IV diuresis on admission.  Diuretics held yesterday due to frequent diarrhea.  Echocardiogram 3/1 showed EF 65%, indeterminate diastolic function, normal RV function, severe left atrial enlargement, mild right atrial enlargement, mild MR, mild AI, mild to moderate TR  Acute hypoxic respiratory failure: Suspected due to diastolic heart failure as above.  Improved with diuresis.  Colitis: Possible IBD per GI.  On IV Solu-Medrol.  Planning for colonoscopy  For questions or updates, please contact [place] Please consult [email or web address] for contact info under        Signed, Donato Heinz, MD  01/13/2023, 9:54 AM

## 2023-01-13 NOTE — Progress Notes (Signed)
PROGRESS NOTE   [name]  [id number] DOB: 1931/03/20 DOA: 01/10/2023 PCP: Corliss Blacker, MD  Brief Narrative:  40 wf Perm AFIB (since 2016) Chad2vasc2>4 [failed tikosyn/amiodarone--DCCV several times]--on BB --see OV note 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 Carles Collet in 2019 L4-L5 lumbar decompression in 2017 by neurosurgeon Dr. Sherwood Gambler  Recent Hospi1/29-->12/14/22 Fall-stools up to 5 times a day-outpatient workup = suggestive of inflammatory disorders noninfective Metformin was discontinued secondary to the diarrhea   presented from Dr. Carlean Purl office visit 2/27 as per his note with subacute presentation of diarrhea several times a day (up to 6-explosive-watery) Had been on Creon with no improvement Caregiver who has been with her since last hospitalization describes cough for the past 96 hours dry No blood in stool  Bolus IVF in ED Dr. Lorenso Courier GI consulted ---gave IV Solu-Medrol  Overnight 2/29 patient became more tachycardic dyspneic and developed new oxygen requirement 3/1 cardiology consulted, echo no WM anomaly, EF 65%   Hospital-Problem based course  acute hypoxic respiratory failure overload-diastolic parameters on current echo difficult to tell  presume AECHF diastolic Appreciate cardiology input-current meds metoprolol 100 twice daily, rlasix stop 3/1 - Echocardiogram -indeterminate Diastology  Colitis, prior concern for GERD Barrett's esophagus ---GI questions whether this could be inflammatory bowel disease - Per GI, steroids on board Solu-Medrol daily 40 mg - rpt Cdiff pending, holding currently outpatient Creon 3 times daily, Imodium 2 mg every 4 - Going for colonoscopy when hemodyn./ Resp status stable--probably  01/15/23  Hypokalemia/hypomagnesemia - K-Dur 40 twice daily until potassium above 4.5, give IV mag 2 gm today - Repeat labs a.m. with magnesium  A-fib CHADVASC >5 last Eliquis dose 8 AM 2/28 - Rate controlled at this time only with metoprolol as above - Not really a great candidate for digoxin at age 89 - Heparin GTT started 3/1 would hold dual apixaban until all procedures performed  Prior thyroid cancer -Looks like she saw Dr. Ralene Ok in the remote past circa 2009 -TSH 4 weeks ago was about 56 and today is 1.04 -She will continue Synthroid 150 every morning  Exophytic kidney lesion 12 mm up from 9 mm previously - Outpatient MR protocol kidney when patient can hold breath  Depression - Continue sertraline 50-careful and watch mentation on this medication although it is a long-term bald  DVT prophylaxis: Heparin prophylactic Code Status: Full  Family Communication: As above Disposition:  Status is: Observation The patient remains OBS appropriate and will d/c before 2 midnights.   Subjective:  Looks well  Not on oxygen earlier but needed to be replaced later in the day  Objective: Vitals:   01/13/23 0500 01/13/23 0504 01/13/23 1244 01/13/23 1252  BP:  (!) 130/90 113/75   Pulse:  97 89   Resp:  15 (!) 26 20  Temp:  98.2 F (36.8 C) (!) 97.2 F (36.2 C)   TempSrc:   Oral   SpO2:  99% 90%   Weight: 63.4 kg     Height:        Intake/Output Summary (Last 24 hours) at 01/13/2023 1823 Last data filed at 01/13/2023 1700 Gross per 24 hour  Intake 909.05 ml  Output --  Net 909.05 ml    Filed Weights   01/10/23 1529 01/12/23 0400 01/13/23 0500  Weight: 66.9 kg 68 kg 63.4  kg    Examination:  EOMI NCAT no focal deficit-hard of hearing  Chest seems bilaterally diminished posterolaterally S1-S2 and rate controlled A-fib, no NSVT today Abdomen is soft Neuro intact  Data Reviewed: personally reviewed   CBC    Component Value Date/Time   WBC 7.2 01/13/2023 0543    RBC 3.32 (L) 01/13/2023 0543   HGB 10.9 (L) 01/13/2023 0543   HGB 13.3 01/10/2021 1133   HGB 12.6 04/14/2009 1107   HCT 33.9 (L) 01/13/2023 0543   HCT 39.2 01/10/2021 1133   HCT 36.9 04/14/2009 1107   PLT 239 01/13/2023 0543   PLT 193 01/10/2021 1133   MCV 102.1 (H) 01/13/2023 0543   MCV 93 01/10/2021 1133   MCV 89.5 04/14/2009 1107   MCH 32.8 01/13/2023 0543   MCHC 32.2 01/13/2023 0543   RDW 13.4 01/13/2023 0543   RDW 13.2 01/10/2021 1133   RDW 13.4 04/14/2009 1107   LYMPHSABS 0.6 (L) 01/10/2023 1704   LYMPHSABS 1.3 01/10/2021 1133   LYMPHSABS 1.0 04/14/2009 1107   MONOABS 0.2 01/10/2023 1704   MONOABS 0.4 04/14/2009 1107   EOSABS 0.0 01/10/2023 1704   EOSABS 0.3 01/10/2021 1133   BASOSABS 0.0 01/10/2023 1704   BASOSABS 0.0 01/10/2021 1133   BASOSABS 0.0 04/14/2009 1107      Latest Ref Rng & Units 01/13/2023    5:43 AM 01/12/2023    6:02 AM 01/11/2023    5:40 AM  CMP  Glucose 70 - 99 mg/dL 111  114  152   BUN 8 - 23 mg/dL '17  16  16   '$ Creatinine 0.44 - 1.00 mg/dL 0.67  0.68  0.63   Sodium 135 - 145 mmol/L 141  139  134   Potassium 3.5 - 5.1 mmol/L 4.0  3.0  3.2   Chloride 98 - 111 mmol/L 100  100  100   CO2 22 - 32 mmol/L '31  31  26   '$ Calcium 8.9 - 10.3 mg/dL 8.6  8.1  8.3   Total Protein 6.5 - 8.1 g/dL  5.6    Total Bilirubin 0.3 - 1.2 mg/dL  0.6    Alkaline Phos 38 - 126 U/L  48    AST 15 - 41 U/L  13    ALT 0 - 44 U/L  11       Radiology Studies: ECHOCARDIOGRAM COMPLETE  Result Date: 01/12/2023    ECHOCARDIOGRAM REPORT   Patient Name:   [name] Date of Exam: 01/12/2023 Medical Rec #:  [id number]        Height:       65.0 in Accession #:    [id number]       Weight:       150.0 lb Date of Birth:  06-Nov-1931         BSA:          1.750 m Patient Age:    89 years         BP:           123/81 mmHg Patient Gender: F                HR:           102 bpm. Exam Location:  Inpatient Procedure: 2D Echo, Color Doppler and Cardiac Doppler Indications:    Congestive Heart  Failure I50.9  History:        Patient has prior history of Echocardiogram examinations, most  recent 07/21/2020. Arrythmias:Atrial Fibrillation,                 Signs/Symptoms:Murmur; Risk Factors:Non-Smoker, Hypertension,                 Sleep Apnea and Diabetes.  Sonographer:    Greer Pickerel Referring Phys: [phone] [facility]  Sonographer Comments: No subcostal window. Image acquisition challenging due to patient body habitus and Image acquisition challenging due to respiratory motion. IMPRESSIONS  1. Left ventricular ejection fraction, by estimation, is 65%. The left ventricle has normal function. Left ventricular endocardial border not optimally defined to evaluate regional wall motion. There is moderate left ventricular hypertrophy of the basal-septal segment. Left ventricular diastolic parameters are indeterminate.  2. Right ventricular systolic function is normal. The right ventricular size is normal.  3. Left atrial size was severely dilated.  4. Right atrial size was mildly dilated.  5. The mitral valve is degenerative. Mild mitral valve regurgitation. Moderate mitral annular calcification.  6. Tricuspid valve regurgitation is mild to moderate.  7. The aortic valve is tricuspid. Aortic valve regurgitation is mild.  8. Aortic dilatation noted. There is mild dilatation of the aortic root, measuring 40 mm. FINDINGS  Left Ventricle: Left ventricular ejection fraction, by estimation, is 65%. The left ventricle has normal function. Left ventricular endocardial border not optimally defined to evaluate regional wall motion. The left ventricular internal cavity size was normal in size. There is moderate left ventricular hypertrophy of the basal-septal segment. Left ventricular diastolic parameters are indeterminate. Right Ventricle: The right ventricular size is normal. No increase in right ventricular wall thickness. Right ventricular systolic function is normal. Left Atrium: Left atrial size  was severely dilated. Right Atrium: Right atrial size was mildly dilated. Pericardium: There is no evidence of pericardial effusion. Mitral Valve: The mitral valve is degenerative in appearance. Moderate mitral annular calcification. Mild mitral valve regurgitation. Tricuspid Valve: The tricuspid valve is grossly normal. Tricuspid valve regurgitation is mild to moderate. No evidence of tricuspid stenosis. Aortic Valve: The aortic valve is tricuspid. Aortic valve regurgitation is mild. Aortic regurgitation PHT measures 545 msec. Pulmonic Valve: The pulmonic valve was not well visualized. Pulmonic valve regurgitation is trivial. No evidence of pulmonic stenosis. Aorta: Aortic dilatation noted. There is mild dilatation of the aortic root, measuring 40 mm. IAS/Shunts: The interatrial septum was not well visualized.  LEFT VENTRICLE PLAX 2D LVIDd:         4.10 cm   Diastology LVIDs:         2.70 cm   LV e' medial:    5.78 cm/s LV PW:         1.00 cm   LV E/e' medial:  22.1 LV IVS:        1.10 cm   LV e' lateral:   6.83 cm/s LVOT diam:     1.60 cm   LV E/e' lateral: 18.7 LV SV:         23 LV SV Index:   13 LVOT Area:     2.01 cm  LEFT ATRIUM              Index        RIGHT ATRIUM           Index LA diam:        4.30 cm  2.46 cm/m   RA Area:     25.20 cm LA Vol (A2C):   120.0 ml 68.55 ml/m  RA Volume:   70.00 ml  39.99 ml/m LA Vol (A4C):   148.0 ml 84.55 ml/m LA Biplane Vol: 147.0 ml 83.98 ml/m  AORTIC VALVE             PULMONIC VALVE LVOT Vmax:   66.60 cm/s  PR End Diast Vel: 2.29 msec LVOT Vmean:  55.100 cm/s LVOT VTI:    0.115 m AI PHT:      545 msec  AORTA Ao Root diam: 4.00 cm MITRAL VALVE                TRICUSPID VALVE MV Area (PHT): 5.62 cm     TR Peak grad:   30.7 mmHg MV Decel Time: 135 msec     TR Vmax:        277.00 cm/s MR Peak grad: 89.1 mmHg MR Vmax:      472.00 cm/s   SHUNTS MV E velocity: 128.00 cm/s  Systemic VTI:  0.12 m                             Systemic Diam: 1.60 cm Rudean Haskell MD  Electronically signed by Rudean Haskell MD Signature Date/Time: 01/12/2023/2:27:19 PM    Final      Scheduled Meds:  (feeding supplement) PROSource Plus  30 mL Oral TID BM   levothyroxine  150 mcg Oral q morning   LORazepam  0.5 mg Oral QHS   methylPREDNISolone (SOLU-MEDROL) injection  40 mg Intravenous Daily   metoprolol tartrate  100 mg Oral BID   multivitamin with minerals  1 tablet Oral Daily   pantoprazole  40 mg Oral Daily   potassium chloride  40 mEq Oral BID   rosuvastatin  10 mg Oral QHS   sertraline  50 mg Oral Daily   sodium chloride flush  3 mL Intravenous Q12H   Continuous Infusions:  heparin 900 Units/hr (01/13/23 1404)     LOS: 2 days   Time spent: 30  Nita Sells, MD [facility] To contact the attending provider between 7A-7P or the covering provider during after hours 7P-7A, please log into the web site [email or web address] and access using universal [place] password for that web site. If you do not have the password, please call the hospital operator.  01/13/2023, 6:23 PM

## 2023-01-13 NOTE — Progress Notes (Signed)
ANTICOAGULATION CONSULT NOTE - Follow Up  Pharmacy Consult for heparin Indication: atrial fibrillation  Allergies  Allergen Reactions   Penicillins Itching, Rash and Other (See Comments)    Did it involve swelling of the face/tongue/throat, SOB, or low BP? No Did it involve sudden or severe rash/hives, skin peeling, or any reaction on the inside of your mouth or nose? No Did you need to seek medical attention at a hospital or doctor's office? No When did it last happen?    30+ years   If all above answers are "NO", may proceed with cephalosporin use.     Patient Measurements: Height: '5\' 5"'$  (165.1 cm) Weight: 63.4 kg (139 lb 11.2 oz) IBW/kg (Calculated) : 57 Heparin Dosing Weight: n/a. Use total body weight  Vital Signs: Temp: 98.2 F (36.8 C) (03/02 0504) BP: 130/90 (03/02 0504) Pulse Rate: 97 (03/02 0504)  Labs: Recent Labs    01/10/23 1704 01/11/23 0540 01/11/23 0951 01/12/23 0602 01/12/23 2125 01/13/23 0543  HGB 11.4* 10.8*  --   --   --  10.9*  HCT 35.2* 32.9*  --   --   --  33.9*  PLT 230 213  --   --   --  239  APTT  --   --   --   --  81*  --   LABPROT 22.9*  --   --   --   --   --   INR 2.1*  --   --   --   --   --   HEPARINUNFRC  --   --   --   --  0.75*  --   CREATININE 0.85 0.63  --  0.68  --  0.67  TROPONINIHS  --   --  7  --   --   --     Estimated Creatinine Clearance: 42.9 mL/min (by C-G formula based on SCr of 0.67 mg/dL).   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   Allergy    SEASONAL   Anemia    years ago   Anticoagulated by anticoagulation treatment - Eliquis 03/30/2014   Started May 2015    Anxiety state 10/13/2010   Qualifier: Diagnosis of  By: Nils Pyle CMA (AAMA), Leisha     Arthritis    "all my joints" (03/31/2014)    Barrett's esophagus    Breast cancer ([place])    s/p right breast lumpectomy   CARDIAC MURMUR 10/13/2010   Qualifier: Diagnosis of  By: Nils Pyle CMA (AAMA), Leisha     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   Cervical cancer ([place])    Chronic cough 01/02/2017   GERD, hiatal hernia Doubt amio   COLONIC POLYPS, ADENOMATOUS, HX OF 10/13/2010   Qualifier: Diagnosis of  By: Nils Pyle CMA (AAMA), Leisha     Degenerative disorder of eye 07/25/2012   Diabetes mellitus without complication ([place])    Dilated cardiomyopathy ([place]) 08/27/2018   DIVERTICULOSIS, COLON 10/13/2010   Qualifier: Diagnosis of  By: Nils Pyle CMA (AAMA), Leisha     DJD (degenerative joint disease) of knee    Dysrhythmia    Afib   Echocardiogram 07/2020    Echo 9/21: EF 70-75, moderate LVH, GR 1 DD, RVSP 40.5 (mildly elevated), normal RVSF, moderate LAE, mild RAE, mild MR, trivial AI  EROSIVE ESOPHAGITIS 10/13/2010   Qualifier: Diagnosis of  By: Nils Pyle CMA (AAMA), Leisha     Error, refractive, myopia 07/09/2012   Essential hypertension 10/13/2010   Qualifier: Diagnosis of  By: Nils Pyle CMA (AAMA), Rueben Bash, intermittent 07/09/2012   GERD 10/14/2010   Qualifier: Diagnosis of  By: Sharlett Iles MD Cline Cools R    GERD (gastroesophageal reflux disease)    Glaucoma    H/O hiatal hernia    Heart murmur    Hiatal hernia s/p robotic repair & fundoplication [id number]   Qualifier: Diagnosis of  By: Nils Pyle CMA (AAMA), Mearl Latin     History of laser assisted in situ keratomileusis 07/25/2012   Hx of transesophageal echocardiography (TEE) for monitoring    TEE (03/2014): No LAA clot, moderate LAE, core triatriatum type structure in RA with no stenosis, normal EF 55-60%, mild MR   HYPERCHOLESTEROLEMIA 10/13/2010   Qualifier: Diagnosis of  By: Nils Pyle CMA (AAMA), Leisha     Hypertension    Hypothyroidism, postsurgical    Iron Deficiency Anemia 10/14/2010   Qualifier: Diagnosis of  By: Sharlett Iles MD Cline Cools R    Irritable bowel  syndrome 10/13/2010   Qualifier: Diagnosis of  By: Nils Pyle CMA (AAMA), Mearl Latin     Low serum potassium    Lumbar stenosis with neurogenic claudication 04/20/2016   Malignant neoplasm of breast ([place]) 02/25/2016   Migraine    "last one was years ago" (03/31/2014)   MIGRAINE HEADACHE 10/13/2010   Qualifier: Diagnosis of  By: Nils Pyle CMA (AAMA), Leisha     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   Overview:  X(T)    OSA on CPAP    settings at 10-11    OSTEOARTHRITIS 10/13/2010   Qualifier: Diagnosis of  By: Nils Pyle CMA (AAMA), Leisha     Persistent atrial fibrillation 03/31/2015   DLCO dropped from 100% in 2015-50% in 01/2017   Post-surgical hypothyroidism 04/09/2014   RECTAL FISSURE 10/13/2010   Qualifier: Diagnosis of  By: Nils Pyle CMA (AAMA), Mearl Latin     S/P Nissen fundoplication (without gastrostomy tube) procedure 05/25/2017   Sleep apnea    THYROID CANCER 10/13/2010   Qualifier: Diagnosis of  By: Nils Pyle CMA Deborra Medina), Mearl Latin     Thyroid cancer [facility])    s/p thyroidectomy   Type II diabetes mellitus ([place])    type II     Medications: Pt prescribed apixaban 5 mg PO BID -Last dose: 2/28 @ 0800  Assessment: Pt is an 9 yoF chronically anticoagulated with apixaban for atrial fibrillation. Admitted with suspected colitis - GI following, planning for colonoscopy. Anticoagulation transitioned to IV UFH.   Heparin level falsely elevated due to recent DOAC. Dose using aPTT for now.  Today, 01/13/23 aPTT = 157 seconds is supratherapeutic on heparin infusion of 1050 units/hr Heparin level = 0.87 remains falsely elevated. CBC: Hgb slightly low but stable, Plt WNL Confirmed with RN that heparin infusing at correct rate. No signs of bleeding.   Goal of Therapy:  aPTT 66 - 102 seconds Heparin level 0.3-0.7 units/ml Monitor platelets by anticoagulation protocol: Yes   Plan:  No boluses per consult instructions  Hold heparin x 1 hour Resume heparin infusion at reduced rate of 900 units/hr Check aPTT 8  hours after heparin resumed CBC, aPTT/heparin level daily. Once aPTT and HL correlate, can monitor using HL only Monitor for signs of bleeding  Per GI note, hold heparin infusion 6 hours prior to colonoscopy. Pt not currently on OR schedule for procedure. Please communicate  to pharmacy once colonoscopy scheduled.   Lenis Noon, PharmD 01/13/2023,8:00 AM

## 2023-01-13 NOTE — Progress Notes (Signed)
ANTICOAGULATION CONSULT NOTE - Follow Up  Pharmacy Consult for heparin Indication: atrial fibrillation  Allergies  Allergen Reactions   Penicillins Itching, Rash and Other (See Comments)    Did it involve swelling of the face/tongue/throat, SOB, or low BP? No Did it involve sudden or severe rash/hives, skin peeling, or any reaction on the inside of your mouth or nose? No Did you need to seek medical attention at a hospital or doctor's office? No When did it last happen?    30+ years   If all above answers are "NO", may proceed with cephalosporin use.     Patient Measurements: Height: '5\' 5"'$  (165.1 cm) Weight: 63.4 kg (139 lb 11.2 oz) IBW/kg (Calculated) : 57 Heparin Dosing Weight: n/a. Use total body weight  Vital Signs: Temp: 97.2 F (36.2 C) (03/02 1244) Temp Source: Oral (03/02 1244) BP: 113/75 (03/02 1244) Pulse Rate: 89 (03/02 1244)  Labs: Recent Labs    01/11/23 0540 01/11/23 0951 01/12/23 0602 01/12/23 2125 01/13/23 0543 01/13/23 1938  HGB 10.8*  --   --   --  10.9*  --   HCT 32.9*  --   --   --  33.9*  --   PLT 213  --   --   --  239  --   APTT  --   --   --  81* 157* 73*  HEPARINUNFRC  --   --   --  0.75* 0.87*  --   CREATININE 0.63  --  0.68  --  0.67  --   TROPONINIHS  --  7  --   --   --   --      Estimated Creatinine Clearance: 42.9 mL/min (by C-G formula based on SCr of 0.67 mg/dL).   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   Allergy    SEASONAL   Anemia    years ago   Anticoagulated by anticoagulation treatment - Eliquis 03/30/2014   Started May 2015    Anxiety state 10/13/2010   Qualifier: Diagnosis of  By: Nils Pyle CMA (AAMA), Leisha     Arthritis    "all my joints" (03/31/2014)   Barrett's esophagus    Breast cancer ([place])     s/p right breast lumpectomy   CARDIAC MURMUR 10/13/2010   Qualifier: Diagnosis of  By: Nils Pyle CMA (AAMA), Leisha     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   Cervical cancer ([place])    Chronic cough 01/02/2017   GERD, hiatal hernia Doubt amio   COLONIC POLYPS, ADENOMATOUS, HX OF 10/13/2010   Qualifier: Diagnosis of  By: Nils Pyle CMA (AAMA), Leisha     Degenerative disorder of eye 07/25/2012   Diabetes mellitus without complication ([place])    Dilated cardiomyopathy ([place]) 08/27/2018   DIVERTICULOSIS, COLON 10/13/2010   Qualifier: Diagnosis of  By: Nils Pyle CMA (AAMA), Leisha     DJD (degenerative joint disease) of knee    Dysrhythmia    Afib   Echocardiogram 07/2020    Echo 9/21: EF 70-75, moderate LVH, GR 1 DD, RVSP 40.5 (mildly elevated), normal RVSF, moderate LAE, mild RAE, mild MR, trivial AI   EROSIVE ESOPHAGITIS 10/13/2010   Qualifier: Diagnosis of  By: Nils Pyle CMA (AAMA), Leisha     Error, refractive, myopia 07/09/2012   Essential hypertension 10/13/2010  Qualifier: Diagnosis of  By: Nils Pyle CMA (AAMA), Rueben Bash, intermittent 07/09/2012   GERD 10/14/2010   Qualifier: Diagnosis of  By: Sharlett Iles MD Cline Cools R    GERD (gastroesophageal reflux disease)    Glaucoma    H/O hiatal hernia    Heart murmur    Hiatal hernia s/p robotic repair & fundoplication [id number]   Qualifier: Diagnosis of  By: Nils Pyle CMA (AAMA), Mearl Latin     History of laser assisted in situ keratomileusis 07/25/2012   Hx of transesophageal echocardiography (TEE) for monitoring    TEE (03/2014): No LAA clot, moderate LAE, core triatriatum type structure in RA with no stenosis, normal EF 55-60%, mild MR   HYPERCHOLESTEROLEMIA 10/13/2010   Qualifier: Diagnosis of  By: Nils Pyle CMA (AAMA), Leisha     Hypertension    Hypothyroidism, postsurgical    Iron Deficiency Anemia 10/14/2010   Qualifier: Diagnosis of  By: Sharlett Iles MD Cline Cools R    Irritable bowel syndrome 10/13/2010   Qualifier: Diagnosis of   By: Nils Pyle CMA (AAMA), Mearl Latin     Low serum potassium    Lumbar stenosis with neurogenic claudication 04/20/2016   Malignant neoplasm of breast ([place]) 02/25/2016   Migraine    "last one was years ago" (03/31/2014)   MIGRAINE HEADACHE 10/13/2010   Qualifier: Diagnosis of  By: Nils Pyle CMA (AAMA), Leisha     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   Overview:  X(T)    OSA on CPAP    settings at 10-11    OSTEOARTHRITIS 10/13/2010   Qualifier: Diagnosis of  By: Nils Pyle CMA (AAMA), Leisha     Persistent atrial fibrillation 03/31/2015   DLCO dropped from 100% in 2015-50% in 01/2017   Post-surgical hypothyroidism 04/09/2014   RECTAL FISSURE 10/13/2010   Qualifier: Diagnosis of  By: Nils Pyle CMA (AAMA), Mearl Latin     S/P Nissen fundoplication (without gastrostomy tube) procedure 05/25/2017   Sleep apnea    THYROID CANCER 10/13/2010   Qualifier: Diagnosis of  By: Nils Pyle CMA Deborra Medina), Mearl Latin     Thyroid cancer [facility])    s/p thyroidectomy   Type II diabetes mellitus ([place])    type II     Medications: Pt prescribed apixaban 5 mg PO BID -Last dose: 2/28 @ 0800  Assessment: Pt is an 52 yoF chronically anticoagulated with apixaban for atrial fibrillation. Admitted with suspected colitis - GI following, planning for colonoscopy. Anticoagulation transitioned to IV UFH.   Heparin level falsely elevated due to recent DOAC. Dose using aPTT for now.  Today, 01/13/23 aPTT = 73 seconds is therapeutic on heparin infusion of 900 units/hr Heparin level = 0.87 remains falsely elevated. CBC: Hgb slightly low but stable, Plt WNL Confirmed with RN that heparin infusing at correct rate. No signs of bleeding.   Goal of Therapy:  aPTT 66 - 102 seconds Heparin level 0.3-0.7 units/ml Monitor platelets by anticoagulation protocol: Yes   Plan:  No boluses per consult instructions  Continue  heparin infusion at  900 units/hr CBC, aPTT/heparin level daily. Once aPTT and HL correlate, can monitor using HL only Monitor for  signs of bleeding  Per GI note on 3/2, hold heparin infusion 4 hours prior to colonoscopy. Currently per note, procedure to take place on 3/4.     Royetta Asal, PharmD, BCPS 01/13/2023 9:15 PM

## 2023-01-13 NOTE — Progress Notes (Addendum)
[facility] Subjective: 89 year old white female with chronic diarrhea for 1 year, noted to have an elevated calprotectin and positive fecal lactoferrin, improved since admission. One  BM today. CTA on admission revealed thickening of the ascending colon ?colitis. Stool studies are negative for enteric pathogens including C. Difficile toxin.She is in Eliquis for PAF which has been held; she is on IV Heparin. Her colonoscopy was deferred due to her atrial fib with RVR and acute on chronic diastolic heart failure. Her dyspnea has improved as well.  Objective: Vital signs in last 24 hours: Temp:  [97.3 F (36.3 C)-98.2 F (36.8 C)] 98.2 F (36.8 C) (03/02 0504) Pulse Rate:  [89-98] 97 (03/02 0504) Resp:  [15-16] 15 (03/02 0504) BP: (119-134)/(60-90) 130/90 (03/02 0504) SpO2:  [96 %-99 %] 99 % (03/02 0504) Weight:  [63.4 kg] 63.4 kg (03/02 0500) Last BM Date : 01/11/23  Intake/Output from previous day: 03/01 0701 - 03/02 0700 In: 1073 [P.O.:651; I.V.:22; IV Piggyback:400] Out: -  Intake/Output this shift: No intake/output data recorded.  General appearance: alert, cooperative, appears stated age, fatigued, and no distress Resp: clear to auscultation bilaterally Cardio: irregularly irregular rhythm GI: soft, non-tender; bowel sounds normal; no masses,  no organomegaly  Lab Results: Recent Labs    01/10/23 1704 01/11/23 0540 01/13/23 0543  WBC 5.3 5.5 7.2  HGB 11.4* 10.8* 10.9*  HCT 35.2* 32.9* 33.9*  PLT 230 213 239   BMET Recent Labs    01/11/23 0540 01/12/23 0602 01/13/23 0543  NA 134* 139 141  K 3.2* 3.0* 4.0  CL 100 100 100  CO2 '26 31 31  '$ GLUCOSE 152* 114* 111*  BUN '16 16 17  '$ CREATININE 0.63 0.68 0.67  CALCIUM 8.3* 8.1* 8.6*   LFT Recent Labs    01/12/23 0602  PROT 5.6*  ALBUMIN 2.7*  AST 13*  ALT 11  ALKPHOS 48  BILITOT 0.6   PT/INR Recent Labs    01/10/23 1704  LABPROT 22.9*  INR 2.1*   Hepatitis Panel No results for input(s):  "HEPBSAG", "HCVAB", "HEPAIGM", "HEPBIGM" in the last 72 hours. C-Diff Recent Labs    01/12/23 1019  CDIFFTOX NEGATIVE   No results for input(s): "CDIFFPCR" in the last 72 hours. Fecal Lactopherrin No results for input(s): "FECLLACTOFRN" in the last 72 hours.  Studies/Results: ECHOCARDIOGRAM COMPLETE  Result Date: 01/12/2023    ECHOCARDIOGRAM REPORT   Patient Name:   [name] Date of Exam: 01/12/2023 Medical Rec #:  [id number]        Height:       65.0 in Accession #:    [id number]       Weight:       150.0 lb Date of Birth:  Apr 06, 1931         BSA:          1.750 m Patient Age:    80 years         BP:           123/81 mmHg Patient Gender: F                HR:           102 bpm. Exam Location:  Inpatient Procedure: 2D Echo, Color Doppler and Cardiac Doppler Indications:    Congestive Heart Failure I50.9  History:        Patient has prior history of Echocardiogram examinations, most  recent 07/21/2020. Arrythmias:Atrial Fibrillation,                 Signs/Symptoms:Murmur; Risk Factors:Non-Smoker, Hypertension,                 Sleep Apnea and Diabetes.  Sonographer:    Greer Pickerel Referring Phys: [phone] [facility]  Sonographer Comments: No subcostal window. Image acquisition challenging due to patient body habitus and Image acquisition challenging due to respiratory motion. IMPRESSIONS  1. Left ventricular ejection fraction, by estimation, is 65%. The left ventricle has normal function. Left ventricular endocardial border not optimally defined to evaluate regional wall motion. There is moderate left ventricular hypertrophy of the basal-septal segment. Left ventricular diastolic parameters are indeterminate.  2. Right ventricular systolic function is normal. The right ventricular size is normal.  3. Left atrial size was severely dilated.  4. Right atrial size was mildly dilated.  5. The mitral valve is degenerative. Mild mitral valve regurgitation. Moderate mitral annular  calcification.  6. Tricuspid valve regurgitation is mild to moderate.  7. The aortic valve is tricuspid. Aortic valve regurgitation is mild.  8. Aortic dilatation noted. There is mild dilatation of the aortic root, measuring 40 mm. FINDINGS  Left Ventricle: Left ventricular ejection fraction, by estimation, is 65%. The left ventricle has normal function. Left ventricular endocardial border not optimally defined to evaluate regional wall motion. The left ventricular internal cavity size was normal in size. There is moderate left ventricular hypertrophy of the basal-septal segment. Left ventricular diastolic parameters are indeterminate. Right Ventricle: The right ventricular size is normal. No increase in right ventricular wall thickness. Right ventricular systolic function is normal. Left Atrium: Left atrial size was severely dilated. Right Atrium: Right atrial size was mildly dilated. Pericardium: There is no evidence of pericardial effusion. Mitral Valve: The mitral valve is degenerative in appearance. Moderate mitral annular calcification. Mild mitral valve regurgitation. Tricuspid Valve: The tricuspid valve is grossly normal. Tricuspid valve regurgitation is mild to moderate. No evidence of tricuspid stenosis. Aortic Valve: The aortic valve is tricuspid. Aortic valve regurgitation is mild. Aortic regurgitation PHT measures 545 msec. Pulmonic Valve: The pulmonic valve was not well visualized. Pulmonic valve regurgitation is trivial. No evidence of pulmonic stenosis. Aorta: Aortic dilatation noted. There is mild dilatation of the aortic root, measuring 40 mm. IAS/Shunts: The interatrial septum was not well visualized.  LEFT VENTRICLE PLAX 2D LVIDd:         4.10 cm   Diastology LVIDs:         2.70 cm   LV e' medial:    5.78 cm/s LV PW:         1.00 cm   LV E/e' medial:  22.1 LV IVS:        1.10 cm   LV e' lateral:   6.83 cm/s LVOT diam:     1.60 cm   LV E/e' lateral: 18.7 LV SV:         23 LV SV Index:   13 LVOT  Area:     2.01 cm  LEFT ATRIUM              Index        RIGHT ATRIUM           Index LA diam:        4.30 cm  2.46 cm/m   RA Area:     25.20 cm LA Vol (A2C):   120.0 ml 68.55 ml/m  RA Volume:   70.00 ml  39.99 ml/m LA Vol (A4C):   148.0 ml 84.55 ml/m LA Biplane Vol: 147.0 ml 83.98 ml/m  AORTIC VALVE             PULMONIC VALVE LVOT Vmax:   66.60 cm/s  PR End Diast Vel: 2.29 msec LVOT Vmean:  55.100 cm/s LVOT VTI:    0.115 m AI PHT:      545 msec  AORTA Ao Root diam: 4.00 cm MITRAL VALVE                TRICUSPID VALVE MV Area (PHT): 5.62 cm     TR Peak grad:   30.7 mmHg MV Decel Time: 135 msec     TR Vmax:        277.00 cm/s MR Peak grad: 89.1 mmHg MR Vmax:      472.00 cm/s   SHUNTS MV E velocity: 128.00 cm/s  Systemic VTI:  0.12 m                             Systemic Diam: 1.60 cm Rudean Haskell MD Electronically signed by Rudean Haskell MD Signature Date/Time: 01/12/2023/2:27:19 PM    Final    DG CHEST PORT 1 VIEW  Result Date: 01/11/2023 CLINICAL DATA:  Pleural effusion EXAM: PORTABLE CHEST 1 VIEW COMPARISON:  12/07/2022 FINDINGS: Prominent cardiac silhouette. Small pleural effusion on the left and moderate pleural effusion right. Pulmonary vascular congestion consistent with pulmonary edema. Mild bibasilar consolidation. Calcified tortuous aorta. Postop changes right axilla. IMPRESSION: Findings consistent with CHF. Moderate pleural effusion on the right and small pleural effusion on the left. Electronically Signed   By: Sammie Bench M.D.   On: 01/11/2023 09:06    Medications: I have reviewed the patient's current medications. Prior to Admission:  Medications Prior to Admission  Medication Sig Dispense Refill Last Dose   acetaminophen (TYLENOL) 650 MG CR tablet Take 650 mg by mouth in the morning and at bedtime.   01/10/2023   apixaban (ELIQUIS) 5 MG TABS tablet Take 1 tablet (5 mg total) by mouth 2 (two) times daily. 60 tablet 1 01/10/2023 at 0800   cetirizine (ZYRTEC) 10 MG  tablet Take 10 mg by mouth daily.   01/10/2023   cholecalciferol (VITAMIN D) 25 MCG (1000 UNIT) tablet Take 1,000 Units by mouth daily.   01/10/2023   ferrous sulfate 325 (65 FE) MG tablet Take 325 mg by mouth daily with breakfast.   Past Week   levothyroxine (SYNTHROID) 150 MCG tablet Take 150 mcg by mouth every morning.   01/10/2023   lipase/protease/amylase (CREON) 36000 UNITS CPEP capsule Take 3 capsule with each meal and 2 capsule with snacks(up to 2 snacks daily) (Patient taking differently: Take 72,000-108,000 Units by mouth See admin instructions. Take 108,000 units (3 capsules) with each meal and 72,000 units (2 capsules) with each snack - up to 2 snacks daily.) 420 capsule 11 01/10/2023   loperamide (IMODIUM) 2 MG capsule Take 1 capsule (2 mg total) by mouth every 4 (four) hours. 60 capsule 0 Past Week   LORazepam (ATIVAN) 1 MG tablet Take 0.5 mg by mouth at bedtime. Take additional 0.5 mg if needed   Past Week   metoprolol tartrate (LOPRESSOR) 100 MG tablet TAKE 1 TABLET(100 MG) BY MOUTH TWICE DAILY (Patient taking differently: Take 100 mg by mouth 2 (two) times daily.) 180 tablet 3 01/10/2023 at 0800   Polyvinyl Alcohol-Povidone PF 1.4-0.6 % SOLN Place 1 drop into both  eyes daily as needed (dry eyes).    Unk   potassium chloride (KLOR-CON M) 10 MEQ tablet Take 10 mEq by mouth daily.   Past Week   predniSONE (DELTASONE) 10 MG tablet Take 4 tablets (40 mg total) by mouth daily with breakfast for 3 days, THEN 3 tablets (30 mg total) daily with breakfast for 3 days, THEN 2 tablets (20 mg total) daily with breakfast for 14 days, THEN 1 tablet (10 mg total) daily with breakfast for 14 days, THEN 0.5 tablets (5 mg total) daily with breakfast for 14 days. 70 tablet 0 01/10/2023   rosuvastatin (CRESTOR) 10 MG tablet Take 10 mg by mouth at bedtime.   01/10/2023   sertraline (ZOLOFT) 50 MG tablet Take 50 mg by mouth daily.   01/10/2023   vitamin B-12 (CYANOCOBALAMIN) 500 MCG tablet Take 500 mcg by mouth daily.    01/10/2023   zinc gluconate 50 MG tablet Take 50 mg by mouth daily.   01/10/2023   ACCU-CHEK GUIDE test strip       Blood Glucose Monitoring Suppl (ACCU-CHEK GUIDE ME) w/Device KIT       Scheduled:  (feeding supplement) PROSource Plus  30 mL Oral TID BM   levothyroxine  150 mcg Oral q morning   LORazepam  0.5 mg Oral QHS   methylPREDNISolone (SOLU-MEDROL) injection  40 mg Intravenous Daily   metoprolol tartrate  100 mg Oral BID   multivitamin with minerals  1 tablet Oral Daily   pantoprazole  40 mg Oral Daily   potassium chloride  40 mEq Oral BID   rosuvastatin  10 mg Oral QHS   sertraline  50 mg Oral Daily   sodium chloride flush  3 mL Intravenous Q12H   Continuous:  heparin 900 Units/hr (01/13/23 1404)   [id number] **OR** acetaminophen, metoprolol tartrate, ondansetron **OR** ondansetron (ZOFRAN) IV  Assessment/Plan: 1) Chronic diarrhea/abnormal CT scan/elevated calprotectin-improved; colonoscopy planned for Monday; will prep tomorrow. Will hold Heparin 4 hours prior to the procedure. 2) HTN/Acute on chronic diastolic heart failure/PAF. 3) Acute repiratory failure secondary to diastolic heart failure. 4) AODM/Hypothyroidism. 5) Depression/anxiety. 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 7) Optic ischemic neuropathy. 8) History of hiatal hernia s/p NFP. 9) Lumbar decompression L4-L5.    LOS: 2 days   Juanita Craver 01/13/2023, 7:40 AM

## 2023-01-13 NOTE — Progress Notes (Signed)
Initial Nutrition Assessment RD working remotely.  DOCUMENTATION CODES:   Not applicable  INTERVENTION:  - ordered 30 ml Prosource Plus TID, each supplement provides 100 kcal and 15 grams protein.   - ordered 1 tablet multivitamin with minerals/day.  - complete NFPE when feasible; monitor for signs of micronutrient deficiencies.  - determine prior creon dosage and if it was sufficient; could also consider trial of pertzye as it, unlike creon, contains bicarbonate.   NUTRITION DIAGNOSIS:   Inadequate oral intake related to chronic illness, diarrhea, other (see comment) (previous documentation of pancreatic insufficiency) as evidenced by percent weight loss.  GOAL:   Patient will meet greater than or equal to 90% of their needs  MONITOR:   PO intake, Supplement acceptance, Labs, Weight trends  REASON FOR ASSESSMENT:   Malnutrition Screening Tool  ASSESSMENT:   89 y.o. female with medical history of persistent A-fib on Eliquis, type 2 DM, HTN, hypothyroidism, HLD, GERD, IDA, s/p hiatal hernia repair and Nissen fundoplication [id number], cervical cancer, breast cancer s/p R lumpectomy, thyroid cancer s/p thyroidectomy, GERD, Barrett's esophagus, OSA on CPAP, migraines, diverticulosis, osteoarthritis, iron deficiency anemia, depression, and anxiety. She presented to the ED at the recommendation of outpatient GI provider for further evaluation and management of generalized weakness and persistent diarrhea. Patient reported diarrhea has been ongoing for ~1 year and has been worse in the past 1 month. Stools are explosive and watery and occur up to 6 times/day. She has been on Creon due to prior workup concerning for pancreatic insufficiency however she has not had any significant improvement in her symptoms. She has not had any abdominal pain. She has had some nausea and vomiting the last 2 days. She has been following with [facility] as an outpatient. Recently had abnormal calprotectin and  fecal lactoferrin testing with negative stool GI profile.  Patient noted to be a/o to self and place. Diet order has been NPO, CLD, FLD, and Heart Healthy at different points during admission since 2/28 evening. Current diet order of Heart Healthy since 3/1 afternoon. She ate 90% of breakfast and 85% of lunch today.   She has not been assessed by a [place] RD since 05/2017.   Weight today is 140 lb and weight on 06/14/22 was 158 lb. This indicates 18 lb / 11% wt loss in 7 months. No information documented in the edema section of flow sheet.   Patient was admitted 12/11/22-12/14/22 due to falls and watery stools up to 5 times/day. Metformin was discontinued at that time due to concern for it leading to diarrhea.   Notes indicate patient was previously on creon without improvement.   Per notes: - re-check for c.diff - possible colonoscopy - aggressive replacement of Mg and K   Labs reviewed; CBGs: 107 and 202 mg/dl, Ca: 8.6 mg/dl, Mg: 1.6 mg/dl, K WDL.  Medications reviewed; 150 mg oral levothyroxine/day, 2 g IV Mg sulfate/day, 40 mg solu-medrol/day, 40 mg oral protonix/day, 40 mEq Klor-Con BID.    NUTRITION - FOCUSED PHYSICAL EXAM:  RD working remotely.  Diet Order:   Diet Order             Diet Heart Room service appropriate? Yes; Fluid consistency: Thin  Diet effective now                   EDUCATION NEEDS:   Not appropriate for education at this time  Skin:  Skin Assessment: Reviewed RN Assessment  Last BM:  3/2 (type 7 x1, large  amount, brown and black)  Height:   Ht Readings from Last 1 Encounters:  01/10/23 '5\' 5"'$  (1.651 m)    Weight:   Wt Readings from Last 1 Encounters:  01/13/23 63.4 kg    BMI:  Body mass index is 23.25 kg/m.  Estimated Nutritional Needs:  Kcal:  1585-1800 kcal Protein:  75-85 grams Fluid:  >/= 2.2 L/day     Jarome Matin, MS, RD, LDN, CNSC Clinical Dietitian PRN/Relief staff On-call/weekend pager # available in [facility]

## 2023-01-14 DIAGNOSIS — I4891 Unspecified atrial fibrillation: Secondary | ICD-10-CM | POA: Diagnosis not present

## 2023-01-14 DIAGNOSIS — I5033 Acute on chronic diastolic (congestive) heart failure: Secondary | ICD-10-CM | POA: Diagnosis not present

## 2023-01-14 DIAGNOSIS — R197 Diarrhea, unspecified: Secondary | ICD-10-CM | POA: Diagnosis not present

## 2023-01-14 LAB — CBC
HCT: 34.6 % — ABNORMAL LOW (ref 36.0–46.0)
Hemoglobin: 11.4 g/dL — ABNORMAL LOW (ref 12.0–15.0)
MCH: 33.7 pg (ref 26.0–34.0)
MCHC: 32.9 g/dL (ref 30.0–36.0)
MCV: 102.4 fL — ABNORMAL HIGH (ref 80.0–100.0)
Platelets: 213 10*3/uL (ref 150–400)
RBC: 3.38 MIL/uL — ABNORMAL LOW (ref 3.87–5.11)
RDW: 13.1 % (ref 11.5–15.5)
WBC: 7.3 10*3/uL (ref 4.0–10.5)
nRBC: 0 % (ref 0.0–0.2)

## 2023-01-14 LAB — RENAL FUNCTION PANEL
Albumin: 3 g/dL — ABNORMAL LOW (ref 3.5–5.0)
Anion gap: 7 (ref 5–15)
BUN: 20 mg/dL (ref 8–23)
CO2: 30 mmol/L (ref 22–32)
Calcium: 8.6 mg/dL — ABNORMAL LOW (ref 8.9–10.3)
Chloride: 103 mmol/L (ref 98–111)
Creatinine, Ser: 0.64 mg/dL (ref 0.44–1.00)
GFR, Estimated: >60 mL/min (ref >60)
Glucose, Bld: 118 mg/dL — ABNORMAL HIGH (ref 70–99)
Phosphorus: 3.1 mg/dL (ref 2.5–4.6)
Potassium: 4.9 mmol/L (ref 3.5–5.1)
Sodium: 140 mmol/L (ref 135–145)

## 2023-01-14 LAB — GLUCOSE, CAPILLARY
Glucose-Capillary: 101 mg/dL — ABNORMAL HIGH (ref 70–99)
Glucose-Capillary: 225 mg/dL — ABNORMAL HIGH (ref 70–99)
Glucose-Capillary: 234 mg/dL — ABNORMAL HIGH (ref 70–99)
Glucose-Capillary: 213 mg/dL — ABNORMAL HIGH (ref 70–99)

## 2023-01-14 LAB — MAGNESIUM: Magnesium: 2.1 mg/dL (ref 1.7–2.4)

## 2023-01-14 LAB — APTT: aPTT: 77 seconds — ABNORMAL HIGH (ref 24–36)

## 2023-01-14 LAB — HEPARIN LEVEL (UNFRACTIONATED): Heparin Unfractionated: 0.34 IU/mL (ref 0.30–0.70)

## 2023-01-14 MED ORDER — DILTIAZEM HCL 30 MG PO TABS
30.0000 mg | ORAL_TABLET | Freq: Four times a day (QID) | ORAL | Status: DC
  Administered 2023-01-14 – 2023-01-15 (×5): 30 mg via ORAL
  Filled 2023-01-14 (×5): qty 1

## 2023-01-14 MED ORDER — PEG 3350-KCL-NA BICARB-NACL 420 G PO SOLR
4000.0000 mL | Freq: Once | ORAL | Status: AC
  Administered 2023-01-14: 4000 mL via ORAL

## 2023-01-14 NOTE — Progress Notes (Signed)
PROGRESS NOTE   [name]  [id number] DOB: 03/26/1931 DOA: 01/10/2023 PCP: Corliss Blacker, MD  Brief Narrative:  26 wf Perm AFIB (since 2016) Chad2vasc2>4 [failed tikosyn/amiodarone--DCCV several times]--on BB --see OV note 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 Carles Collet in 2019 L4-L5 lumbar decompression in 2017 by neurosurgeon Dr. Sherwood Gambler  Recent Hosp 1/29-->12/14/22 Fall-stools ~5 times a day-outpatient workup = suggestive of inflammatory disorders /noninfective Metformin was discontinued secondary to the diarrhea   presented from Dr. Carlean Purl office visit 2/27 -- subacute presentation of diarrhea several times a day (up to 6-explosive-watery) Had been on Creon with no improvement  Bolus IVF in ED Dr. Lorenso Courier GI consulted ---gave IV Solu-Medrol  Overnight 2/29 patient became more tachycardic dyspneic and developed new oxygen requirement 3/1 cardiology consulted, echo no WM anomaly, EF 65%   Hospital-Problem based course  acute hypoxic respiratory failure overload-diastolic parameters on current echo difficult to tell  presume AECHF diastolic Appreciate cardiology input-current meds :metoprolol 100 twice daily, Cardizem 30 q6 given intermittent tachycardia - Echocardiogram -indeterminate Diastology -Diuretics on hold weight down from 68 to 62 kg, I/O's inaccurate  Colitis, prior concern for GERD Barrett's esophagus ---GI questions ? inflammatory bowel disease - Per GI, steroids on board Solu-Medrol daily 40 mg, stools have slowed - rpt Cdiff STILL pending, holding--outpatient Creon 3 times daily + Imodium 2 mg every 4 - Prepped for colonoscopy 01/15/23 as is hemodynamically more stable and less hypoxic  Hypokalemia/hypomagnesemia - Potassium/magnesium  replacement stopped on 3/3  A-fib CHADVASC >5 last Eliquis dose 8 AM 2/28 - Rate controlled metoprolol, Cardizem currently - Not really a great candidate for digoxin at age 13 - Heparin GTT started 3/1 with hold parameters as per GI for procedure  Prior thyroid cancer -Looks like she saw Dr. Ralene Ok in the remote past circa 2009 -TSH 4 weeks ago was about 23 and today is 1.04 - continue Synthroid 150 every morning  Exophytic kidney lesion 12 mm up from 9 mm previously - Outpatient MR protocol kidney when patient can hold breath  Depression - Continue sertraline 50-careful and watch mentation   DVT prophylaxis: Heparin prophylactic Code Status: Full  Family Communication: D/W family briefly in the hallway Disposition:  Status is: Observation The patient remains OBS appropriate and will d/c before 2 midnights.   Subjective:  Looks well  Not on oxygen earlier --does not desat She looks much more comfortable than she did when she first came in She says she is only little short of breath she is using the spirometer No fever no chills Has been changed to liquid diet  Objective: Vitals:   01/13/23 2106 01/14/23 0500 01/14/23 0711 01/14/23 1329  BP: 119/87  (!) 144/107 (!) 127/97  Pulse: 88  87 (!) 42  Resp: '16  16 20  '$ Temp: 98.6 F (37 C)  97.6 F (36.4 C) 97.8 F (36.6 C)  TempSrc:   Oral Oral  SpO2:   100% 98%  Weight:  62.8 kg    Height:        Intake/Output Summary (Last 24 hours) at 01/14/2023 1558 Last data filed at 01/14/2023 0650 Gross per 24 hour  Intake 210 ml  Output 200 ml  Net 10 ml    Filed Weights   01/12/23 0400 01/13/23 0500 01/14/23 0500  Weight: 68 kg 63.4  kg 62.8 kg    Examination:  EOMI NCAT no focal deficit-hard of hearing  No JVD at 30 degrees Chest seems clearer compared to prior S1-S2 and rate controlled A-fib, no NSVT today Abdomen is soft Neuro intact  Data Reviewed: personally reviewed   CBC    Component Value Date/Time    WBC 7.3 01/14/2023 0539   RBC 3.38 (L) 01/14/2023 0539   HGB 11.4 (L) 01/14/2023 0539   HGB 13.3 01/10/2021 1133   HGB 12.6 04/14/2009 1107   HCT 34.6 (L) 01/14/2023 0539   HCT 39.2 01/10/2021 1133   HCT 36.9 04/14/2009 1107   PLT 213 01/14/2023 0539   PLT 193 01/10/2021 1133   MCV 102.4 (H) 01/14/2023 0539   MCV 93 01/10/2021 1133   MCV 89.5 04/14/2009 1107   MCH 33.7 01/14/2023 0539   MCHC 32.9 01/14/2023 0539   RDW 13.1 01/14/2023 0539   RDW 13.2 01/10/2021 1133   RDW 13.4 04/14/2009 1107   LYMPHSABS 0.6 (L) 01/10/2023 1704   LYMPHSABS 1.3 01/10/2021 1133   LYMPHSABS 1.0 04/14/2009 1107   MONOABS 0.2 01/10/2023 1704   MONOABS 0.4 04/14/2009 1107   EOSABS 0.0 01/10/2023 1704   EOSABS 0.3 01/10/2021 1133   BASOSABS 0.0 01/10/2023 1704   BASOSABS 0.0 01/10/2021 1133   BASOSABS 0.0 04/14/2009 1107      Latest Ref Rng & Units 01/14/2023    5:39 AM 01/13/2023    5:43 AM 01/12/2023    6:02 AM  CMP  Glucose 70 - 99 mg/dL 118  111  114   BUN 8 - 23 mg/dL '20  17  16   '$ Creatinine 0.44 - 1.00 mg/dL 0.64  0.67  0.68   Sodium 135 - 145 mmol/L 140  141  139   Potassium 3.5 - 5.1 mmol/L 4.9  4.0  3.0   Chloride 98 - 111 mmol/L 103  100  100   CO2 22 - 32 mmol/L '30  31  31   '$ Calcium 8.9 - 10.3 mg/dL 8.6  8.6  8.1   Total Protein 6.5 - 8.1 g/dL   5.6   Total Bilirubin 0.3 - 1.2 mg/dL   0.6   Alkaline Phos 38 - 126 U/L   48   AST 15 - 41 U/L   13   ALT 0 - 44 U/L   11      Radiology Studies: No results found.   Scheduled Meds:  (feeding supplement) PROSource Plus  30 mL Oral TID BM   diltiazem  30 mg Oral Q6H   levothyroxine  150 mcg Oral q morning   LORazepam  0.5 mg Oral QHS   methylPREDNISolone (SOLU-MEDROL) injection  40 mg Intravenous Daily   metoprolol tartrate  100 mg Oral BID   multivitamin with minerals  1 tablet Oral Daily   pantoprazole  40 mg Oral Daily   polyethylene glycol-electrolytes  4,000 mL Oral Once   rosuvastatin  10 mg Oral QHS   sertraline  50 mg  Oral Daily   sodium chloride flush  3 mL Intravenous Q12H   Continuous Infusions:  heparin 900 Units/hr (01/13/23 1404)     LOS: 3 days   Time spent: 30  Nita Sells, MD [facility] To contact the attending provider between 7A-7P or the covering provider during after hours 7P-7A, please log into the web site [email or web address] and access using universal [place] password for that web site. If you do not have the password, please call the  hospital operator.  01/14/2023, 3:58 PM

## 2023-01-14 NOTE — Progress Notes (Addendum)
ANTICOAGULATION CONSULT NOTE - Follow Up  Pharmacy Consult for heparin Indication: atrial fibrillation  Allergies  Allergen Reactions   Penicillins Itching, Rash and Other (See Comments)    Did it involve swelling of the face/tongue/throat, SOB, or low BP? No Did it involve sudden or severe rash/hives, skin peeling, or any reaction on the inside of your mouth or nose? No Did you need to seek medical attention at a hospital or doctor's office? No When did it last happen?    30+ years   If all above answers are "NO", may proceed with cephalosporin use.     Patient Measurements: Height: '5\' 5"'$  (165.1 cm) Weight: 62.8 kg (138 lb 8 oz) IBW/kg (Calculated) : 57 Heparin Dosing Weight: n/a. Use total body weight  Vital Signs: Temp: 97.6 F (36.4 C) (03/03 0711) Temp Source: Oral (03/03 0711) BP: 144/107 (03/03 0711) Pulse Rate: 87 (03/03 0711)  Labs: Recent Labs    01/11/23 0951 01/12/23 0602 01/12/23 2125 01/12/23 2125 01/13/23 0543 01/13/23 1938 01/14/23 0539  HGB  --   --   --   --  10.9*  --  11.4*  HCT  --   --   --   --  33.9*  --  34.6*  PLT  --   --   --   --  239  --  213  APTT  --   --  81*   < > 157* 73* 77*  HEPARINUNFRC  --   --  0.75*  --  0.87*  --  0.34  CREATININE  --  0.68  --   --  0.67  --  0.64  TROPONINIHS 7  --   --   --   --   --   --    < > = values in this interval not displayed.     Estimated Creatinine Clearance: 42.9 mL/min (by C-G formula based on SCr of 0.64 mg/dL).   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   Allergy    SEASONAL   Anemia    years ago   Anticoagulated by anticoagulation treatment - Eliquis 03/30/2014   Started May 2015    Anxiety state 10/13/2010   Qualifier: Diagnosis of  By: Nils Pyle CMA (AAMA), Leisha      Arthritis    "all my joints" (03/31/2014)   Barrett's esophagus    Breast cancer ([place])    s/p right breast lumpectomy   CARDIAC MURMUR 10/13/2010   Qualifier: Diagnosis of  By: Nils Pyle CMA (AAMA), Leisha     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   Cervical cancer ([place])    Chronic cough 01/02/2017   GERD, hiatal hernia Doubt amio   COLONIC POLYPS, ADENOMATOUS, HX OF 10/13/2010   Qualifier: Diagnosis of  By: Nils Pyle CMA (AAMA), Leisha     Degenerative disorder of eye 07/25/2012   Diabetes mellitus without complication ([place])    Dilated cardiomyopathy ([place]) 08/27/2018   DIVERTICULOSIS, COLON 10/13/2010   Qualifier: Diagnosis of  By: Nils Pyle CMA (AAMA), Leisha     DJD (degenerative joint disease) of knee    Dysrhythmia    Afib   Echocardiogram 07/2020    Echo 9/21: EF 70-75, moderate LVH, GR 1 DD, RVSP 40.5 (mildly elevated), normal RVSF, moderate LAE, mild RAE, mild MR,  trivial AI   EROSIVE ESOPHAGITIS 10/13/2010   Qualifier: Diagnosis of  By: Nils Pyle CMA (AAMA), Leisha     Error, refractive, myopia 07/09/2012   Essential hypertension 10/13/2010   Qualifier: Diagnosis of  By: Nils Pyle CMA (AAMA), Rueben Bash, intermittent 07/09/2012   GERD 10/14/2010   Qualifier: Diagnosis of  By: Sharlett Iles MD Cline Cools R    GERD (gastroesophageal reflux disease)    Glaucoma    H/O hiatal hernia    Heart murmur    Hiatal hernia s/p robotic repair & fundoplication [id number]   Qualifier: Diagnosis of  By: Nils Pyle CMA (AAMA), Mearl Latin     History of laser assisted in situ keratomileusis 07/25/2012   Hx of transesophageal echocardiography (TEE) for monitoring    TEE (03/2014): No LAA clot, moderate LAE, core triatriatum type structure in RA with no stenosis, normal EF 55-60%, mild MR   HYPERCHOLESTEROLEMIA 10/13/2010   Qualifier: Diagnosis of  By: Nils Pyle CMA (AAMA), Leisha     Hypertension    Hypothyroidism, postsurgical    Iron Deficiency Anemia 10/14/2010   Qualifier: Diagnosis of  By:  Sharlett Iles MD Cline Cools R    Irritable bowel syndrome 10/13/2010   Qualifier: Diagnosis of  By: Nils Pyle CMA (AAMA), Mearl Latin     Low serum potassium    Lumbar stenosis with neurogenic claudication 04/20/2016   Malignant neoplasm of breast ([place]) 02/25/2016   Migraine    "last one was years ago" (03/31/2014)   MIGRAINE HEADACHE 10/13/2010   Qualifier: Diagnosis of  By: Nils Pyle CMA (AAMA), Leisha     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   Overview:  X(T)    OSA on CPAP    settings at 10-11    OSTEOARTHRITIS 10/13/2010   Qualifier: Diagnosis of  By: Nils Pyle CMA (AAMA), Leisha     Persistent atrial fibrillation 03/31/2015   DLCO dropped from 100% in 2015-50% in 01/2017   Post-surgical hypothyroidism 04/09/2014   RECTAL FISSURE 10/13/2010   Qualifier: Diagnosis of  By: Nils Pyle CMA (AAMA), Mearl Latin     S/P Nissen fundoplication (without gastrostomy tube) procedure 05/25/2017   Sleep apnea    THYROID CANCER 10/13/2010   Qualifier: Diagnosis of  By: Nils Pyle CMA Deborra Medina), Mearl Latin     Thyroid cancer [facility])    s/p thyroidectomy   Type II diabetes mellitus ([place])    type II     Medications: Pt prescribed apixaban 5 mg PO BID -Last dose: 2/28 @ 0800  Assessment: Pt is an 47 yoF chronically anticoagulated with apixaban for atrial fibrillation. Admitted with suspected colitis - GI following, planning for colonoscopy. Anticoagulation transitioned to IV UFH.   Heparin level falsely elevated at baseline due to DOAC.   Today, 01/14/23 Confirmatory aPTT = 77 remains therapeutic on heparin infusion of 900 units/hr Heparin level = 0.32 therapeutic. Heparin level & aPTT correlating  CBC: Hgb slightly low but stable, Plt WNL Confirmed with RN that heparin infusing at correct rate. No signs of bleeding.   Goal of Therapy:  aPTT 66 - 102 seconds Heparin level 0.3-0.7 units/ml Monitor platelets by anticoagulation protocol: Yes   Plan:  No boluses per consult instructions  Continue heparin at current rate of 900  units/hr CBC, heparin level daily. Further monitoring of aPTT unnecessary.  Monitor for signs of bleeding  Per GI note on 3/2, planning for colonoscopy on Monday 3/4. Heparin to be held 4 hours prior to procedure. Procedure time not currently on OR schedule.   Lenis Noon, PharmD  01/14/2023,7:51 AM  Addendum: Notified by Dr. Collene Mares that colonoscopy is scheduled for 3/4 at 1345. Will stop heparin 4 hours prior at 0930.   Lenis Noon, PharmD 01/14/23 9:09 AM

## 2023-01-14 NOTE — Progress Notes (Signed)
CROSS [facility]OVER [facility] Subjective: Since I last evaluated the patient, she seems to be doing very well from a GI standpoint.  She has had no BMs today and is tolerating her diet well. She is awaiting a colonoscopy tomorrow for further evaluation of her chronic diarrhea and will be prepped tonight.  Objective: Vital signs in last 24 hours: Temp:  [97.2 F (36.2 C)-98.6 F (37 C)] 97.6 F (36.4 C) (03/03 0711) Pulse Rate:  [87-94] 87 (03/03 0711) Resp:  [16-26] 16 (03/03 0711) BP: (113-144)/(75-107) 144/107 (03/03 0711) SpO2:  [90 %-100 %] 100 % (03/03 0711) Weight:  [62.8 kg] 62.8 kg (03/03 0500) Last BM Date : 01/13/23  Intake/Output from previous day: 03/02 0701 - 03/03 0700 In: 909.1 [P.O.:690; I.V.:219.1] Out: 200 [Urine:200] Intake/Output this shift: No intake/output data recorded.  General appearance: alert, cooperative, appears stated age, fatigued, and no distress Resp: clear to auscultation bilaterally Cardio: irregularly irregular rhythm GI: soft, non-tender; bowel sounds normal; no masses,  no organomegaly  Lab Results: Recent Labs    01/13/23 0543 01/14/23 0539  WBC 7.2 7.3  HGB 10.9* 11.4*  HCT 33.9* 34.6*  PLT 239 213   BMET Recent Labs    01/12/23 0602 01/13/23 0543 01/14/23 0539  NA 139 141 140  K 3.0* 4.0 4.9  CL 100 100 103  CO2 '31 31 30  '$ GLUCOSE 114* 111* 118*  BUN '16 17 20  '$ CREATININE 0.68 0.67 0.64  CALCIUM 8.1* 8.6* 8.6*   LFT Recent Labs    01/12/23 0602 01/14/23 0539  PROT 5.6*  --   ALBUMIN 2.7* 3.0*  AST 13*  --   ALT 11  --   ALKPHOS 48  --   BILITOT 0.6  --    PT/INR No results for input(s): "LABPROT", "INR" in the last 72 hours. Hepatitis Panel No results for input(s): "HEPBSAG", "HCVAB", "HEPAIGM", "HEPBIGM" in the last 72 hours. C-Diff Recent Labs    01/12/23 1019  CDIFFTOX NEGATIVE   No results for input(s): "CDIFFPCR" in the last 72 hours. Fecal Lactopherrin No results for input(s): "FECLLACTOFRN" in the last 72  hours.  Studies/Results: ECHOCARDIOGRAM COMPLETE  Result Date: 01/12/2023    ECHOCARDIOGRAM REPORT   Patient Name:   [name] Date of Exam: 01/12/2023 Medical Rec #:  [id number]        Height:       65.0 in Accession #:    [id number]       Weight:       150.0 lb Date of Birth:  May 16, 1931         BSA:          1.750 m Patient Age:    13 years         BP:           123/81 mmHg Patient Gender: F                HR:           102 bpm. Exam Location:  Inpatient Procedure: 2D Echo, Color Doppler and Cardiac Doppler Indications:    Congestive Heart Failure I50.9  History:        Patient has prior history of Echocardiogram examinations, most                 recent 07/21/2020. Arrythmias:Atrial Fibrillation,                 Signs/Symptoms:Murmur; Risk Factors:Non-Smoker, Hypertension,  Sleep Apnea and Diabetes.  Sonographer:    Greer Pickerel Referring Phys: [phone] [facility]  Sonographer Comments: No subcostal window. Image acquisition challenging due to patient body habitus and Image acquisition challenging due to respiratory motion. IMPRESSIONS  1. Left ventricular ejection fraction, by estimation, is 65%. The left ventricle has normal function. Left ventricular endocardial border not optimally defined to evaluate regional wall motion. There is moderate left ventricular hypertrophy of the basal-septal segment. Left ventricular diastolic parameters are indeterminate.  2. Right ventricular systolic function is normal. The right ventricular size is normal.  3. Left atrial size was severely dilated.  4. Right atrial size was mildly dilated.  5. The mitral valve is degenerative. Mild mitral valve regurgitation. Moderate mitral annular calcification.  6. Tricuspid valve regurgitation is mild to moderate.  7. The aortic valve is tricuspid. Aortic valve regurgitation is mild.  8. Aortic dilatation noted. There is mild dilatation of the aortic root, measuring 40 mm. FINDINGS  Left Ventricle: Left  ventricular ejection fraction, by estimation, is 65%. The left ventricle has normal function. Left ventricular endocardial border not optimally defined to evaluate regional wall motion. The left ventricular internal cavity size was normal in size. There is moderate left ventricular hypertrophy of the basal-septal segment. Left ventricular diastolic parameters are indeterminate. Right Ventricle: The right ventricular size is normal. No increase in right ventricular wall thickness. Right ventricular systolic function is normal. Left Atrium: Left atrial size was severely dilated. Right Atrium: Right atrial size was mildly dilated. Pericardium: There is no evidence of pericardial effusion. Mitral Valve: The mitral valve is degenerative in appearance. Moderate mitral annular calcification. Mild mitral valve regurgitation. Tricuspid Valve: The tricuspid valve is grossly normal. Tricuspid valve regurgitation is mild to moderate. No evidence of tricuspid stenosis. Aortic Valve: The aortic valve is tricuspid. Aortic valve regurgitation is mild. Aortic regurgitation PHT measures 545 msec. Pulmonic Valve: The pulmonic valve was not well visualized. Pulmonic valve regurgitation is trivial. No evidence of pulmonic stenosis. Aorta: Aortic dilatation noted. There is mild dilatation of the aortic root, measuring 40 mm. IAS/Shunts: The interatrial septum was not well visualized.  LEFT VENTRICLE PLAX 2D LVIDd:         4.10 cm   Diastology LVIDs:         2.70 cm   LV e' medial:    5.78 cm/s LV PW:         1.00 cm   LV E/e' medial:  22.1 LV IVS:        1.10 cm   LV e' lateral:   6.83 cm/s LVOT diam:     1.60 cm   LV E/e' lateral: 18.7 LV SV:         23 LV SV Index:   13 LVOT Area:     2.01 cm  LEFT ATRIUM              Index        RIGHT ATRIUM           Index LA diam:        4.30 cm  2.46 cm/m   RA Area:     25.20 cm LA Vol (A2C):   120.0 ml 68.55 ml/m  RA Volume:   70.00 ml  39.99 ml/m LA Vol (A4C):   148.0 ml 84.55 ml/m LA  Biplane Vol: 147.0 ml 83.98 ml/m  AORTIC VALVE             PULMONIC VALVE LVOT Vmax:  66.60 cm/s  PR End Diast Vel: 2.29 msec LVOT Vmean:  55.100 cm/s LVOT VTI:    0.115 m AI PHT:      545 msec  AORTA Ao Root diam: 4.00 cm MITRAL VALVE                TRICUSPID VALVE MV Area (PHT): 5.62 cm     TR Peak grad:   30.7 mmHg MV Decel Time: 135 msec     TR Vmax:        277.00 cm/s MR Peak grad: 89.1 mmHg MR Vmax:      472.00 cm/s   SHUNTS MV E velocity: 128.00 cm/s  Systemic VTI:  0.12 m                             Systemic Diam: 1.60 cm Rudean Haskell MD Electronically signed by Rudean Haskell MD Signature Date/Time: 01/12/2023/2:27:19 PM    Final     Medications: I have reviewed the patient's current medications. Prior to Admission:  Medications Prior to Admission  Medication Sig Dispense Refill Last Dose   acetaminophen (TYLENOL) 650 MG CR tablet Take 650 mg by mouth in the morning and at bedtime.   01/10/2023   apixaban (ELIQUIS) 5 MG TABS tablet Take 1 tablet (5 mg total) by mouth 2 (two) times daily. 60 tablet 1 01/10/2023 at 0800   cetirizine (ZYRTEC) 10 MG tablet Take 10 mg by mouth daily.   01/10/2023   cholecalciferol (VITAMIN D) 25 MCG (1000 UNIT) tablet Take 1,000 Units by mouth daily.   01/10/2023   ferrous sulfate 325 (65 FE) MG tablet Take 325 mg by mouth daily with breakfast.   Past Week   levothyroxine (SYNTHROID) 150 MCG tablet Take 150 mcg by mouth every morning.   01/10/2023   lipase/protease/amylase (CREON) 36000 UNITS CPEP capsule Take 3 capsule with each meal and 2 capsule with snacks(up to 2 snacks daily) (Patient taking differently: Take 72,000-108,000 Units by mouth See admin instructions. Take 108,000 units (3 capsules) with each meal and 72,000 units (2 capsules) with each snack - up to 2 snacks daily.) 420 capsule 11 01/10/2023   loperamide (IMODIUM) 2 MG capsule Take 1 capsule (2 mg total) by mouth every 4 (four) hours. 60 capsule 0 Past Week   LORazepam (ATIVAN) 1 MG  tablet Take 0.5 mg by mouth at bedtime. Take additional 0.5 mg if needed   Past Week   metoprolol tartrate (LOPRESSOR) 100 MG tablet TAKE 1 TABLET(100 MG) BY MOUTH TWICE DAILY (Patient taking differently: Take 100 mg by mouth 2 (two) times daily.) 180 tablet 3 01/10/2023 at 0800   Polyvinyl Alcohol-Povidone PF 1.4-0.6 % SOLN Place 1 drop into both eyes daily as needed (dry eyes).    Unk   potassium chloride (KLOR-CON M) 10 MEQ tablet Take 10 mEq by mouth daily.   Past Week   predniSONE (DELTASONE) 10 MG tablet Take 4 tablets (40 mg total) by mouth daily with breakfast for 3 days, THEN 3 tablets (30 mg total) daily with breakfast for 3 days, THEN 2 tablets (20 mg total) daily with breakfast for 14 days, THEN 1 tablet (10 mg total) daily with breakfast for 14 days, THEN 0.5 tablets (5 mg total) daily with breakfast for 14 days. 70 tablet 0 01/10/2023   rosuvastatin (CRESTOR) 10 MG tablet Take 10 mg by mouth at bedtime.   01/10/2023   sertraline (ZOLOFT) 50 MG tablet Take  50 mg by mouth daily.   01/10/2023   vitamin B-12 (CYANOCOBALAMIN) 500 MCG tablet Take 500 mcg by mouth daily.   01/10/2023   zinc gluconate 50 MG tablet Take 50 mg by mouth daily.   01/10/2023   ACCU-CHEK GUIDE test strip       Blood Glucose Monitoring Suppl (ACCU-CHEK GUIDE ME) w/Device KIT       Scheduled:  (feeding supplement) PROSource Plus  30 mL Oral TID BM   diltiazem  30 mg Oral Q6H   levothyroxine  150 mcg Oral q morning   LORazepam  0.5 mg Oral QHS   methylPREDNISolone (SOLU-MEDROL) injection  40 mg Intravenous Daily   metoprolol tartrate  100 mg Oral BID   multivitamin with minerals  1 tablet Oral Daily   pantoprazole  40 mg Oral Daily   polyethylene glycol-electrolytes  4,000 mL Oral Once   rosuvastatin  10 mg Oral QHS   sertraline  50 mg Oral Daily   sodium chloride flush  3 mL Intravenous Q12H   Continuous:  heparin 900 Units/hr (01/13/23 1404)   Assessment/Plan: 1) Chronic diarrhea/abnormal CT scan/elevated  calprotectin-improved; colonoscopy planned for Monday; will prep today. The pharmacist is aware to hold the Heparin 4 hours prior to the procedure; her colonoscopy is scheduled at 1:45 PM tomorrow. 2) HTN/Acute on chronic diastolic heart failure/PAF. 3) Acute repiratory failure secondary to diastolic heart failure. 4) AODM/Hypothyroidism. 5) Depression/anxiety. 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 7) Optic ischemic neuropathy. 8) History of hiatal hernia s/p NFP. 9) Lumbar decompression L4-L5.  LOS: 3 days   Juanita Craver 01/14/2023, 8:59 AM

## 2023-01-14 NOTE — Progress Notes (Signed)
Rounding Note    Patient Name: [name] Date of Encounter: 01/14/2023  [place] Cardiologist: Sherren Mocha, MD   Subjective   BP 144/107.  SpO2 100% on 3L [place].  Cr 0.64, Hgb 11.4.  I/Os not recorded.  Reports dyspnea improved  Inpatient Medications    Scheduled Meds:  (feeding supplement) PROSource Plus  30 mL Oral TID BM   levothyroxine  150 mcg Oral q morning   LORazepam  0.5 mg Oral QHS   methylPREDNISolone (SOLU-MEDROL) injection  40 mg Intravenous Daily   metoprolol tartrate  100 mg Oral BID   multivitamin with minerals  1 tablet Oral Daily   pantoprazole  40 mg Oral Daily   potassium chloride  40 mEq Oral BID   rosuvastatin  10 mg Oral QHS   sertraline  50 mg Oral Daily   sodium chloride flush  3 mL Intravenous Q12H   Continuous Infusions:  heparin 900 Units/hr (01/13/23 1404)   PRN Meds: acetaminophen **OR** acetaminophen, metoprolol tartrate, ondansetron **OR** ondansetron (ZOFRAN) IV   Vital Signs    Vitals:   01/13/23 2103 01/13/23 2106 01/14/23 0500 01/14/23 0711  BP: (!) 125/92 119/87  (!) 144/107  Pulse: 94 88  87  Resp: '16 16  16  '$ Temp: 98.5 F (36.9 C) 98.6 F (37 C)  97.6 F (36.4 C)  TempSrc: Oral   Oral  SpO2: 100%   100%  Weight:   62.8 kg   Height:        Intake/Output Summary (Last 24 hours) at 01/14/2023 0826 Last data filed at 01/14/2023 0650 Gross per 24 hour  Intake 909.05 ml  Output 200 ml  Net 709.05 ml       01/14/2023    5:00 AM 01/13/2023    5:00 AM 01/12/2023    4:00 AM  Last 3 Weights  Weight (lbs) 138 lb 8 oz 139 lb 11.2 oz 150 lb  Weight (kg) 62.823 kg 63.368 kg 68.04 kg      Telemetry    Afib 100-120s - Personally Reviewed  ECG    NO new ECG - Personally Reviewed  Physical Exam   GEN: No acute distress.   Neck: No JVD Cardiac: tachycardic, irregular no murmurs, rubs, or gallops.  Respiratory: Clear to auscultation bilaterally. GI: Soft, nontender, non-distended  MS: No edema; No  deformity. Neuro:  Nonfocal  Psych: Normal affect   Labs    High Sensitivity Troponin:   Recent Labs  Lab 01/11/23 0951  TROPONINIHS 7      Chemistry Recent Labs  Lab 01/10/23 1704 01/11/23 0540 01/12/23 0602 01/13/23 0543 01/14/23 0539  NA 136   < > 139 141 140  K 3.8   < > 3.0* 4.0 4.9  CL 100   < > 100 100 103  CO2 27   < > '31 31 30  '$ GLUCOSE 182*   < > 114* 111* 118*  BUN 14   < > '16 17 20  '$ CREATININE 0.85   < > 0.68 0.67 0.64  CALCIUM 8.6*   < > 8.1* 8.6* 8.6*  MG  --   --  1.5* 1.6* 2.1  PROT 6.3*  --  5.6*  --   --   ALBUMIN 3.1*  --  2.7*  --  3.0*  AST 29  --  13*  --   --   ALT 14  --  11  --   --   ALKPHOS 55  --  48  --   --   BILITOT 1.2  --  0.6  --   --   GFRNONAA >60   < > >60 >60 >60  ANIONGAP 9   < > '8 10 7   '$ < > = values in this interval not displayed.     Lipids No results for input(s): "CHOL", "TRIG", "HDL", "LABVLDL", "LDLCALC", "CHOLHDL" in the last 168 hours.  Hematology Recent Labs  Lab 01/11/23 0540 01/13/23 0543 01/14/23 0539  WBC 5.5 7.2 7.3  RBC 3.26* 3.32* 3.38*  HGB 10.8* 10.9* 11.4*  HCT 32.9* 33.9* 34.6*  MCV 100.9* 102.1* 102.4*  MCH 33.1 32.8 33.7  MCHC 32.8 32.2 32.9  RDW 13.3 13.4 13.1  PLT 213 239 213    Thyroid  Recent Labs  Lab 01/11/23 0540  TSH 1.046     BNP Recent Labs  Lab 01/11/23 0951  BNP 468.9*     DDimer No results for input(s): "DDIMER" in the last 168 hours.   Radiology    ECHOCARDIOGRAM COMPLETE  Result Date: 01/12/2023    ECHOCARDIOGRAM REPORT   Patient Name:   [name] Date of Exam: 01/12/2023 Medical Rec #:  [id number]        Height:       65.0 in Accession #:    [id number]       Weight:       150.0 lb Date of Birth:  1931-05-08         BSA:          1.750 m Patient Age:    89 years         BP:           123/81 mmHg Patient Gender: F                HR:           102 bpm. Exam Location:  Inpatient Procedure: 2D Echo, Color Doppler and Cardiac Doppler Indications:    Congestive Heart  Failure I50.9  History:        Patient has prior history of Echocardiogram examinations, most                 recent 07/21/2020. Arrythmias:Atrial Fibrillation,                 Signs/Symptoms:Murmur; Risk Factors:Non-Smoker, Hypertension,                 Sleep Apnea and Diabetes.  Sonographer:    Greer Pickerel Referring Phys: [phone] [facility]  Sonographer Comments: No subcostal window. Image acquisition challenging due to patient body habitus and Image acquisition challenging due to respiratory motion. IMPRESSIONS  1. Left ventricular ejection fraction, by estimation, is 65%. The left ventricle has normal function. Left ventricular endocardial border not optimally defined to evaluate regional wall motion. There is moderate left ventricular hypertrophy of the basal-septal segment. Left ventricular diastolic parameters are indeterminate.  2. Right ventricular systolic function is normal. The right ventricular size is normal.  3. Left atrial size was severely dilated.  4. Right atrial size was mildly dilated.  5. The mitral valve is degenerative. Mild mitral valve regurgitation. Moderate mitral annular calcification.  6. Tricuspid valve regurgitation is mild to moderate.  7. The aortic valve is tricuspid. Aortic valve regurgitation is mild.  8. Aortic dilatation noted. There is mild dilatation of the aortic root, measuring 40 mm. FINDINGS  Left Ventricle: Left ventricular ejection fraction, by estimation, is 65%. The left  ventricle has normal function. Left ventricular endocardial border not optimally defined to evaluate regional wall motion. The left ventricular internal cavity size was normal in size. There is moderate left ventricular hypertrophy of the basal-septal segment. Left ventricular diastolic parameters are indeterminate. Right Ventricle: The right ventricular size is normal. No increase in right ventricular wall thickness. Right ventricular systolic function is normal. Left Atrium: Left atrial size  was severely dilated. Right Atrium: Right atrial size was mildly dilated. Pericardium: There is no evidence of pericardial effusion. Mitral Valve: The mitral valve is degenerative in appearance. Moderate mitral annular calcification. Mild mitral valve regurgitation. Tricuspid Valve: The tricuspid valve is grossly normal. Tricuspid valve regurgitation is mild to moderate. No evidence of tricuspid stenosis. Aortic Valve: The aortic valve is tricuspid. Aortic valve regurgitation is mild. Aortic regurgitation PHT measures 545 msec. Pulmonic Valve: The pulmonic valve was not well visualized. Pulmonic valve regurgitation is trivial. No evidence of pulmonic stenosis. Aorta: Aortic dilatation noted. There is mild dilatation of the aortic root, measuring 40 mm. IAS/Shunts: The interatrial septum was not well visualized.  LEFT VENTRICLE PLAX 2D LVIDd:         4.10 cm   Diastology LVIDs:         2.70 cm   LV e' medial:    5.78 cm/s LV PW:         1.00 cm   LV E/e' medial:  22.1 LV IVS:        1.10 cm   LV e' lateral:   6.83 cm/s LVOT diam:     1.60 cm   LV E/e' lateral: 18.7 LV SV:         23 LV SV Index:   13 LVOT Area:     2.01 cm  LEFT ATRIUM              Index        RIGHT ATRIUM           Index LA diam:        4.30 cm  2.46 cm/m   RA Area:     25.20 cm LA Vol (A2C):   120.0 ml 68.55 ml/m  RA Volume:   70.00 ml  39.99 ml/m LA Vol (A4C):   148.0 ml 84.55 ml/m LA Biplane Vol: 147.0 ml 83.98 ml/m  AORTIC VALVE             PULMONIC VALVE LVOT Vmax:   66.60 cm/s  PR End Diast Vel: 2.29 msec LVOT Vmean:  55.100 cm/s LVOT VTI:    0.115 m AI PHT:      545 msec  AORTA Ao Root diam: 4.00 cm MITRAL VALVE                TRICUSPID VALVE MV Area (PHT): 5.62 cm     TR Peak grad:   30.7 mmHg MV Decel Time: 135 msec     TR Vmax:        277.00 cm/s MR Peak grad: 89.1 mmHg MR Vmax:      472.00 cm/s   SHUNTS MV E velocity: 128.00 cm/s  Systemic VTI:  0.12 m                             Systemic Diam: 1.60 cm Rudean Haskell MD  Electronically signed by Rudean Haskell MD Signature Date/Time: 01/12/2023/2:27:19 PM    Final     Cardiac Studies  Patient Profile     89 y.o. female with history of permanent atrial fibrillation, chronic diastolic heart failure, [id number], hypertension, hypothyroidism we are consulted for evaluation of atrial fibrillation with RVR  Assessment & Plan    Permanent atrial fibrillation: Previously failed amiodarone and Tikosyn.  On metoprolol 100 mg twice daily for rate control at home and Eliquis for anticoagulation.  Presented with A-fib with RVR.   -GI planning colonoscopy tomorrow.  Eliquis held, on IV heparin -Continue metoprolol 100 mg twice daily, rates currently mildly elevated.  Suspect elevated rates driven by acute illness and IV steroids.  Normal systolic function on echocardiogram.  Will add low-dose diltiazem  Acute on chronic diastolic heart failure: Given IV diuresis on admission.  Diuretics subsequently held due to frequent diarrhea.  Currently appears euvolemic.  Echocardiogram 3/1 showed EF 65%, indeterminate diastolic function, normal RV function, severe left atrial enlargement, mild right atrial enlargement, mild MR, mild AI, mild to moderate TR  Acute hypoxic respiratory failure: Suspected due to diastolic heart failure as above.  Improved with diuresis.  Colitis: Possible IBD per GI.  On IV Solu-Medrol.  Planning for colonoscopy tomorrow  For questions or updates, please contact [place] Please consult [email or web address] for contact info under        Signed, Donato Heinz, MD  01/14/2023, 8:26 AM

## 2023-01-15 ENCOUNTER — Inpatient Hospital Stay (HOSPITAL_COMMUNITY): Payer: Medicare PPO | Admitting: Anesthesiology

## 2023-01-15 ENCOUNTER — Encounter (HOSPITAL_COMMUNITY): Payer: Self-pay | Admitting: Internal Medicine

## 2023-01-15 ENCOUNTER — Encounter (HOSPITAL_COMMUNITY)
Admission: EM | Disposition: A | Discharge status: Home Health | Payer: Self-pay | Source: Home / Self Care | Attending: Family Medicine

## 2023-01-15 DIAGNOSIS — I4891 Unspecified atrial fibrillation: Secondary | ICD-10-CM | POA: Diagnosis not present

## 2023-01-15 DIAGNOSIS — K648 Other hemorrhoids: Secondary | ICD-10-CM

## 2023-01-15 DIAGNOSIS — K5730 Diverticulosis of large intestine without perforation or abscess without bleeding: Secondary | ICD-10-CM

## 2023-01-15 DIAGNOSIS — I10 Essential (primary) hypertension: Secondary | ICD-10-CM

## 2023-01-15 DIAGNOSIS — I5033 Acute on chronic diastolic (congestive) heart failure: Secondary | ICD-10-CM | POA: Diagnosis not present

## 2023-01-15 DIAGNOSIS — I340 Nonrheumatic mitral (valve) insufficiency: Secondary | ICD-10-CM

## 2023-01-15 HISTORY — PX: COLONOSCOPY: SHX5424

## 2023-01-15 HISTORY — PX: BIOPSY: SHX5522

## 2023-01-15 LAB — CBC
HCT: 34.5 % — ABNORMAL LOW (ref 36.0–46.0)
Hemoglobin: 11 g/dL — ABNORMAL LOW (ref 12.0–15.0)
MCH: 32.4 pg (ref 26.0–34.0)
MCHC: 31.9 g/dL (ref 30.0–36.0)
MCV: 101.8 fL — ABNORMAL HIGH (ref 80.0–100.0)
Platelets: 218 10*3/uL (ref 150–400)
RBC: 3.39 MIL/uL — ABNORMAL LOW (ref 3.87–5.11)
RDW: 13 % (ref 11.5–15.5)
WBC: 6.5 10*3/uL (ref 4.0–10.5)
nRBC: 0 % (ref 0.0–0.2)

## 2023-01-15 LAB — BASIC METABOLIC PANEL
Anion gap: 9 (ref 5–15)
BUN: 12 mg/dL (ref 8–23)
CO2: 27 mmol/L (ref 22–32)
Calcium: 8.6 mg/dL — ABNORMAL LOW (ref 8.9–10.3)
Chloride: 103 mmol/L (ref 98–111)
Creatinine, Ser: 0.55 mg/dL (ref 0.44–1.00)
GFR, Estimated: >60 mL/min (ref >60)
Glucose, Bld: 123 mg/dL — ABNORMAL HIGH (ref 70–99)
Potassium: 3.7 mmol/L (ref 3.5–5.1)
Sodium: 139 mmol/L (ref 135–145)

## 2023-01-15 LAB — GLUCOSE, CAPILLARY
Glucose-Capillary: 161 mg/dL — ABNORMAL HIGH (ref 70–99)
Glucose-Capillary: 140 mg/dL — ABNORMAL HIGH (ref 70–99)
Glucose-Capillary: 246 mg/dL — ABNORMAL HIGH (ref 70–99)
Glucose-Capillary: 249 mg/dL — ABNORMAL HIGH (ref 70–99)

## 2023-01-15 LAB — HEPARIN LEVEL (UNFRACTIONATED): Heparin Unfractionated: 0.3 IU/mL (ref 0.30–0.70)

## 2023-01-15 LAB — MAGNESIUM: Magnesium: 2 mg/dL (ref 1.7–2.4)

## 2023-01-15 SURGERY — COLONOSCOPY
Anesthesia: Monitor Anesthesia Care

## 2023-01-15 MED ORDER — APIXABAN 5 MG PO TABS: 5.0000 mg | ORAL_TABLET | Freq: Two times a day (BID) | ORAL | Status: DC

## 2023-01-15 MED ORDER — LABETALOL HCL 5 MG/ML IV SOLN
INTRAVENOUS | Status: DC | PRN
  Administered 2023-01-15: 5 mg via INTRAVENOUS

## 2023-01-15 MED ORDER — DILTIAZEM HCL 30 MG PO TABS
60.0000 mg | ORAL_TABLET | Freq: Once | ORAL | Status: AC
  Administered 2023-01-15: 60 mg via ORAL
  Filled 2023-01-15: qty 2

## 2023-01-15 MED ORDER — APIXABAN 5 MG PO TABS
5.0000 mg | ORAL_TABLET | Freq: Two times a day (BID) | ORAL | Status: DC
  Administered 2023-01-15 – 2023-01-16 (×2): 5 mg via ORAL
  Filled 2023-01-15 (×2): qty 1

## 2023-01-15 MED ORDER — SODIUM CHLORIDE 0.9 % IV SOLN: INTRAVENOUS | Status: DC

## 2023-01-15 MED ORDER — DILTIAZEM HCL 30 MG PO TABS
60.0000 mg | ORAL_TABLET | Freq: Four times a day (QID) | ORAL | Status: DC
  Administered 2023-01-15 – 2023-01-16 (×2): 60 mg via ORAL
  Filled 2023-01-15 (×2): qty 2

## 2023-01-15 MED ORDER — LACTATED RINGERS IV SOLN: INTRAVENOUS | Status: DC

## 2023-01-15 MED ORDER — PROPOFOL 500 MG/50ML IV EMUL
INTRAVENOUS | Status: AC
  Filled 2023-01-15: qty 50

## 2023-01-15 MED ORDER — LIDOCAINE HCL 1 % IJ SOLN
INTRAMUSCULAR | Status: DC | PRN
  Administered 2023-01-15 (×2): 50 mg via INTRADERMAL

## 2023-01-15 MED ORDER — ESMOLOL HCL 100 MG/10ML IV SOLN
INTRAVENOUS | Status: DC | PRN
  Administered 2023-01-15: 20 mg via INTRAVENOUS

## 2023-01-15 MED ORDER — PROPOFOL 500 MG/50ML IV EMUL
INTRAVENOUS | Status: DC | PRN
  Administered 2023-01-15: 110 ug/kg/min via INTRAVENOUS

## 2023-01-15 MED ORDER — METOPROLOL TARTRATE 5 MG/5ML IV SOLN
INTRAVENOUS | Status: DC | PRN
  Administered 2023-01-15 (×2): 2.5 mg via INTRAVENOUS

## 2023-01-15 NOTE — Progress Notes (Signed)
ANTICOAGULATION CONSULT NOTE - Follow Up  Pharmacy Consult for heparin Indication: atrial fibrillation  Allergies  Allergen Reactions   Penicillins Itching, Rash and Other (See Comments)    Did it involve swelling of the face/tongue/throat, SOB, or low BP? No Did it involve sudden or severe rash/hives, skin peeling, or any reaction on the inside of your mouth or nose? No Did you need to seek medical attention at a hospital or doctor's office? No When did it last happen?    30+ years   If all above answers are "NO", may proceed with cephalosporin use.     Patient Measurements: Height: '5\' 5"'$  (165.1 cm) Weight: 62.8 kg (138 lb 8 oz) IBW/kg (Calculated) : 57 Heparin Dosing Weight: n/a. Use total body weight  Vital Signs: Temp: 97.4 F (36.3 C) (03/04 0602) Temp Source: Oral (03/04 0602) BP: 147/100 (03/04 0602) Pulse Rate: 53 (03/04 0602)  Labs: Recent Labs    01/13/23 0543 01/13/23 1938 01/14/23 0539 01/15/23 0505  HGB 10.9*  --  11.4* 11.0*  HCT 33.9*  --  34.6* 34.5*  PLT 239  --  213 218  APTT 157* 73* 77*  --   HEPARINUNFRC 0.87*  --  0.34 0.30  CREATININE 0.67  --  0.64 0.55     Estimated Creatinine Clearance: 42.9 mL/min (by C-G formula based on SCr of 0.55 mg/dL).   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   Allergy    SEASONAL   Anemia    years ago   Anticoagulated by anticoagulation treatment - Eliquis 03/30/2014   Started May 2015    Anxiety state 10/13/2010   Qualifier: Diagnosis of  By: Nils Pyle CMA (AAMA), Leisha     Arthritis    "all my joints" (03/31/2014)   Barrett's esophagus    Breast cancer ([place])    s/p right breast lumpectomy   CARDIAC MURMUR 10/13/2010   Qualifier: Diagnosis of  By: Nils Pyle CMA (AAMA), Leisha     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   Cervical cancer ([place])    Chronic cough 01/02/2017   GERD, hiatal hernia Doubt amio   COLONIC POLYPS, ADENOMATOUS, HX OF 10/13/2010   Qualifier: Diagnosis of  By: Nils Pyle CMA (AAMA), Leisha     Degenerative disorder of eye 07/25/2012   Diabetes mellitus without complication ([place])    Dilated cardiomyopathy ([place]) 08/27/2018   DIVERTICULOSIS, COLON 10/13/2010   Qualifier: Diagnosis of  By: Nils Pyle CMA (AAMA), Leisha     DJD (degenerative joint disease) of knee    Dysrhythmia    Afib   Echocardiogram 07/2020    Echo 9/21: EF 70-75, moderate LVH, GR 1 DD, RVSP 40.5 (mildly elevated), normal RVSF, moderate LAE, mild RAE, mild MR, trivial AI   EROSIVE ESOPHAGITIS 10/13/2010   Qualifier: Diagnosis of  By: Nils Pyle CMA (AAMA), Leisha     Error, refractive, myopia 07/09/2012   Essential hypertension 10/13/2010   Qualifier: Diagnosis of  By: Nils Pyle CMA (AAMA), Rueben Bash, intermittent 07/09/2012   GERD 10/14/2010   Qualifier: Diagnosis of  By: Sharlett Iles MD Byrd Hesselbach    GERD (gastroesophageal reflux disease)    Glaucoma    H/O hiatal hernia    Heart murmur    Hiatal hernia s/p robotic repair & fundoplication  05/25/2017 10/13/2010   Qualifier: Diagnosis of  By: Nils Pyle CMA Deborra Medina), Mearl Latin     History of laser assisted in situ keratomileusis 07/25/2012   Hx of transesophageal echocardiography (TEE) for monitoring    TEE (03/2014): No LAA clot, moderate LAE, core triatriatum type structure in RA with no stenosis, normal EF 55-60%, mild MR   HYPERCHOLESTEROLEMIA 10/13/2010   Qualifier: Diagnosis of  By: Nils Pyle CMA (AAMA), Leisha     Hypertension    Hypothyroidism, postsurgical    Iron Deficiency Anemia 10/14/2010   Qualifier: Diagnosis of  By: Sharlett Iles MD Cline Cools R    Irritable bowel syndrome 10/13/2010   Qualifier: Diagnosis of  By: Nils Pyle CMA (AAMA), Mearl Latin     Low serum potassium    Lumbar stenosis with neurogenic claudication 04/20/2016   Malignant  neoplasm of breast ([place]) 02/25/2016   Migraine    "last one was years ago" (03/31/2014)   MIGRAINE HEADACHE 10/13/2010   Qualifier: Diagnosis of  By: Nils Pyle CMA (AAMA), Leisha     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   Overview:  X(T)    OSA on CPAP    settings at 10-11    OSTEOARTHRITIS 10/13/2010   Qualifier: Diagnosis of  By: Nils Pyle CMA (AAMA), Leisha     Persistent atrial fibrillation 03/31/2015   DLCO dropped from 100% in 2015-50% in 01/2017   Post-surgical hypothyroidism 04/09/2014   RECTAL FISSURE 10/13/2010   Qualifier: Diagnosis of  By: Nils Pyle CMA (AAMA), Mearl Latin     S/P Nissen fundoplication (without gastrostomy tube) procedure 05/25/2017   Sleep apnea    THYROID CANCER 10/13/2010   Qualifier: Diagnosis of  By: Nils Pyle CMA Deborra Medina), Mearl Latin     Thyroid cancer [facility])    s/p thyroidectomy   Type II diabetes mellitus ([place])    type II     Medications: Pt prescribed apixaban 5 mg PO BID -Last dose: 2/28 @ 0800  Assessment: Pt is an 71 yoF chronically anticoagulated with apixaban for atrial fibrillation. Admitted with suspected colitis - GI following, planning for colonoscopy. Anticoagulation transitioned to IV UFH.   Heparin level falsely elevated at baseline due to DOAC. Now monitoring using heparin level only.   Today, 01/15/23 Heparin level = 0.30 remains therapeutic on heparin infusion of 900 units/hr CBC: Hgb slightly low but stable, Plt WNL No bleeding reported.  Goal of Therapy:  Heparin level 0.3-0.7 units/ml Monitor platelets by anticoagulation protocol: Yes   Plan:  -No boluses per consult instructions  Continue heparin at current rate of 900 units/hr. Pt scheduled for colonoscopy at 1345 this afternoon. Heparin infusion to stop at 0930 this morning (4 hours prior to procedure) per GI. Follow along for anticoagulation plan post-procedure.   Lenis Noon, PharmD 01/15/23 7:22 AM

## 2023-01-15 NOTE — Progress Notes (Signed)
PT Cancellation Note  Patient Details Name: [name] MRN: [id number] DOB: [birth year]   Cancelled Treatment:    Reason Eval/Treat Not Completed: Patient at procedure or test/unavailable--pt scheduled to have colonoscopy on today. Will check back on tomorrow.    [facility]  Office: [phone]

## 2023-01-15 NOTE — Op Note (Signed)
[facility] Patient Name: [name] Procedure Date: 01/15/2023 MRN: [id number] Attending MD: Docia Chuck. Henrene Pastor , MD, [id number] Date of Birth: May 19, 1931 CSN: [id number] Age: 89 Admit Type: Inpatient Procedure:                Colonoscopy with biopsies Indications:              Chronic diarrhea Providers:                Docia Chuck. Henrene Pastor, MD, Jaci Carrel, RN, Gloris Ham, Technician Referring MD:             [facility] Medicines:                Monitored Anesthesia Care Complications:            No immediate complications. Estimated blood loss:                            None. Estimated Blood Loss:     Estimated blood loss: none. Procedure:                Pre-Anesthesia Assessment:                           - Prior to the procedure, a History and Physical                            was performed, and patient medications and                            allergies were reviewed. The patient's tolerance of                            previous anesthesia was also reviewed. The risks                            and benefits of the procedure and the sedation                            options and risks were discussed with the patient.                            All questions were answered, and informed consent                            was obtained. Prior Anticoagulants: The patient has                            taken Eliquis (apixaban), last dose was 3 days                            prior to procedure. ASA Grade Assessment: III - A  patient with severe systemic disease. After                            reviewing the risks and benefits, the patient was                            deemed in satisfactory condition to undergo the                            procedure.                           After obtaining informed consent, the colonoscope                            was passed under direct vision. Throughout the                             procedure, the patient's blood pressure, pulse, and                            oxygen saturations were monitored continuously. The                            [id number]) Olympus colonoscope was                            introduced through the anus and advanced to the the                            cecum, identified by appendiceal orifice and                            ileocecal valve. The ileocecal valve, appendiceal                            orifice, and rectum were photographed. The quality                            of the bowel preparation was excellent. The                            colonoscopy was performed without difficulty. The                            patient tolerated the procedure well. The bowel                            preparation used was SUPREP via split dose                            instruction. Scope In: 2:04:25 PM Scope Out: 2:17:15 PM Scope Withdrawal Time: 0 hours 8 minutes 43 seconds  Total Procedure Duration: 0 hours 12 minutes 50 seconds  Findings:  Multiple diverticula were found in the left colon and right colon. No       SCAD.      Internal hemorrhoids were found during retroflexion. The hemorrhoids       were moderate. Prominent rectal veins, as well.      The exam was otherwise without abnormality on direct and retroflexion       views.      Biopsies for histology were taken with a cold forceps from the entire       colon for evaluation of microscopic colitis. Impression:               - Diverticulosis in the left colon and in the right                            colon.                           - Internal hemorrhoids.                           - The examination was otherwise normal on direct                            and retroflexion views.                           - Biopsies were taken with a cold forceps from the                            entire colon for evaluation of microscopic colitis. Moderate  Sedation:      none Recommendation:           - Repeat colonoscopy is not recommended for                            surveillance.                           - Patient has a contact number available for                            emergencies. The signs and symptoms of potential                            delayed complications were discussed with the                            patient. Return to normal activities tomorrow.                            Written discharge instructions were provided to the                            patient.                           - Resume previous diet.                           -  Continue present medications.                           - Await pathology results. Procedure Code(s):        --- Professional ---                           [phone], Colonoscopy, flexible; with biopsy, single                            or multiple Diagnosis Code(s):        --- Professional ---                           K64.8, Other hemorrhoids                           K52.9, Noninfective gastroenteritis and colitis,                            unspecified                           K57.30, Diverticulosis of large intestine without                            perforation or abscess without bleeding CPT copyright 2022 American Medical Association. All rights reserved. The codes documented in this report are preliminary and upon coder review may  be revised to meet current compliance requirements. Docia Chuck. Henrene Pastor, MD 01/15/2023 2:34:44 PM This report has been signed electronically. Number of Addenda: 0

## 2023-01-15 NOTE — Progress Notes (Addendum)
    Progress Note   Subjective  Chief Complaint: Chronic diarrhea  This morning, patient found resting comfortably with her caretaker by her side.  Per caretaker she had another liquid bowel movement this morning that was yellow/clear.  Patient is ready for her colonoscopy later today.   Objective   Vital signs in last 24 hours: Temp:  [97.4 F (36.3 C)-97.8 F (36.6 C)] 97.4 F (36.3 C) (03/04 0602) Pulse Rate:  [42-100] 53 (03/04 0602) Resp:  [17-20] 17 (03/04 0602) BP: (122-147)/(86-100) 147/100 (03/04 0602) SpO2:  [96 %-99 %] 96 % (03/04 0602) Weight:  [65.4 kg] 65.4 kg (03/04 0721) Last BM Date : 01/14/23 General:    Elderly white female in NAD Heart:  Regular rate and rhythm; no murmurs Lungs: Respirations even and unlabored, lungs CTA bilaterally Abdomen:  Soft, nontender and nondistended. Normal bowel sounds. Psych:  Cooperative. Normal mood and affect.  Intake/Output from previous day: 03/03 0701 - 03/04 0700 In: 220 [P.O.:220] Out: 12 [Urine:6; Stool:6]   Lab Results: Recent Labs    01/13/23 0543 01/14/23 0539 01/15/23 0505  WBC 7.2 7.3 6.5  HGB 10.9* 11.4* 11.0*  HCT 33.9* 34.6* 34.5*  PLT 239 213 218   BMET Recent Labs    01/13/23 0543 01/14/23 0539 01/15/23 0505  NA 141 140 139  K 4.0 4.9 3.7  CL 100 103 103  CO2 '31 30 27  '$ GLUCOSE 111* 118* 123*  BUN '17 20 12  '$ CREATININE 0.67 0.64 0.55  CALCIUM 8.6* 8.6* 8.6*   LFT Recent Labs    01/14/23 0539  ALBUMIN 3.0*     Assessment / Plan:   Assessment: 1.  Chronic diarrhea: With abnormal CT scan and elevated calprotectin (improved), colonoscopy planned for today for further evaluation 2.  Hypertension/acute on chronic diastolic heart failure/PAF 3.  Acute respiratory failure secondary to diastolic heart failure 4.  Hypothyroidism 5.  Depression/anxiety 6.  Peripheral neuropathy 7.  Optic ischemic neuropathy 8.  History of hiatal hernia status post Niesen fundoplication 9.  Lumbar  decompression L4-L5  Plan: 1.  Plan for diagnostic colonoscopy today for further evaluation of ongoing chronic diarrhea with previously elevated calprotectin.  Did discuss risks, benefits, limitations and alternatives and the patient agrees to proceed. 2.  Patient to remain n.p.o. for now. 3.  Heparin on hold as of this morning, restart per recommendations from Dr. Henrene Pastor after time procedure today.  Thank you for your kind consultation.   LOS: 4 days   Levin Erp  01/15/2023, 9:15 AM  GI ATTENDING  Interval history data reviewed.  Patient has been seen and examined in the endoscopy suite.  Patient is now for colonoscopy to evaluate chronic diarrhea of uncertain cause.  She is high risk given her age and comorbidities.  Docia Chuck. Geri Seminole., M.D. [facility] Division of Gastroenterology

## 2023-01-15 NOTE — Anesthesia Preprocedure Evaluation (Addendum)
Anesthesia Evaluation  Patient identified by MRN, date of birth, ID band Patient awake    Reviewed: Allergy & Precautions, NPO status , Patient's Chart, lab work & pertinent test results  Airway Mallampati: II  TM Distance: >3 FB Neck ROM: Full    Dental  (+) Teeth Intact, Dental Advisory Given, Caps   Pulmonary sleep apnea and Continuous Positive Airway Pressure Ventilation    Pulmonary exam normal breath sounds clear to auscultation       Cardiovascular hypertension, + dysrhythmias Atrial Fibrillation + Valvular Problems/Murmurs MR  Rhythm:Irregular Rate:Tachycardia + Systolic murmurs Echo [id number]:  1. Left ventricular ejection fraction, by estimation, is 65%. The left  ventricle has normal function. Left ventricular endocardial border not  optimally defined to evaluate regional wall motion. There is moderate left  ventricular hypertrophy of the  basal-septal segment. Left ventricular diastolic parameters are  indeterminate.   2. Right ventricular systolic function is normal. The right ventricular  size is normal.   3. Left atrial size was severely dilated.   4. Right atrial size was mildly dilated.   5. The mitral valve is degenerative. Mild mitral valve regurgitation.  Moderate mitral annular calcification.   6. Tricuspid valve regurgitation is mild to moderate.   7. The aortic valve is tricuspid. Aortic valve regurgitation is mild.   8. Aortic dilatation noted. There is mild dilatation of the aortic root,  measuring 40 mm.     Neuro/Psych  Headaches PSYCHIATRIC DISORDERS Anxiety Depression       GI/Hepatic Neg liver ROS, hiatal hernia,GERD  ,,S/p Nissen fundoplication  Chronic diarrhea   Endo/Other  diabetes, Type 2Hypothyroidism    Renal/GU negative Renal ROS     Musculoskeletal  (+) Arthritis ,    Abdominal   Peds  Hematology  (+) Blood dyscrasia, anemia   Anesthesia Other Findings Day of surgery  medications reviewed with the patient.  Reproductive/Obstetrics                             Anesthesia Physical Anesthesia Plan  ASA: 3  Anesthesia Plan: MAC   Post-op Pain Management: Minimal or no pain anticipated   Induction: Intravenous  PONV Risk Score and Plan: 2 and TIVA and Treatment may vary due to age or medical condition  Airway Management Planned: Natural Airway and Simple Face Mask  Additional Equipment:   Intra-op Plan:   Post-operative Plan:   Informed Consent: I have reviewed the patients History and Physical, chart, labs and discussed the procedure including the risks, benefits and alternatives for the proposed anesthesia with the patient or authorized representative who has indicated his/her understanding and acceptance.     Dental advisory given  Plan Discussed with: CRNA  Anesthesia Plan Comments: (Missed 12pm dose of cardizem. A-fib in 110s, BP stable, patient asymptomatic.)        Anesthesia Quick Evaluation

## 2023-01-15 NOTE — Transfer of Care (Signed)
Immediate Anesthesia Transfer of Care Note  Patient: [name]  Procedure(s) Performed: COLONOSCOPY BIOPSY  Patient Location: PACU and Endoscopy Unit  Anesthesia Type:MAC  Level of Consciousness: oriented, drowsy, and patient cooperative  Airway & Oxygen Therapy: Patient Spontanous Breathing  Post-op Assessment: Report given to RN and Post -op Vital signs reviewed and stable  Post vital signs: Reviewed and stable  Last Vitals:  Vitals Value Taken Time  BP    Temp    Pulse    Resp    SpO2      Last Pain:  Vitals:   01/15/23 1302  TempSrc: Temporal  PainSc: 0-No pain         Complications: No notable events documented.

## 2023-01-15 NOTE — Care Management Important Message (Signed)
Important Message  Patient Details IM Letter given Name: KAZIYA YZAGUIRRE MRN: [id number] Date of Birth: Apr 09, 1931   Medicare Important Message Given:  Yes     Kerin Salen 01/15/2023, 11:45 AM

## 2023-01-15 NOTE — Progress Notes (Signed)
Patient presents to recovery area with HR in Afib between 110's-130's. Anesthesiologist is aware of this as HR was treated several times in preop and intra-op. Attending physician and anesthesiologist order to have patient's '30mg'$  PO dose of Cardizem down in recovery then send her back to her room. Patient's VSS otherwise and she is in NAD. Transported back to room by this RN.

## 2023-01-15 NOTE — Progress Notes (Signed)
PROGRESS NOTE   [name]  [id number] DOB: December 08, 1930 DOA: 01/10/2023 PCP: Corliss Blacker, MD  Brief Narrative:  83 wf Perm AFIB (since 2016) Chad2vasc2>4 [failed tikosyn/amiodarone--DCCV several times]--on BB --see OV note 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 Carles Collet in 2019 L4-L5 lumbar decompression in 2017 by neurosurgeon Dr. Sherwood Gambler  Recent Hosp 1/29-->12/14/22 Fall-stools ~5 times a day-outpatient workup = suggestive of inflammatory disorders /noninfective Metformin was discontinued secondary to the diarrhea   presented from Dr. Carlean Purl office visit 2/27 -- subacute presentation of diarrhea several times a day (up to 6-explosive-watery) Had been on Creon with no improvement  Bolus IVF in ED Dr. Lorenso Courier GI consulted ---gave IV Solu-Medrol  Overnight 2/29 patient became more tachycardic dyspneic and developed new oxygen requirement 3/1 cardiology consulted, echo no WM anomaly, EF 65% 3/4 colonoscopy Dr. Charma Igo present internal hemorrhoids moderate-sized-also cold biopsies performed for workup of microscopic colitis  Hospital-Problem based course  acute hypoxic respiratory failure overload-diastolic parameters on current echo difficult to tell  presume AECHF diastolic Appreciate cardiology input-current meds :metoprolol 100 twice daily, Cardizem increased to 60 every 6 with tachycardia today postprocedure - Echocardiogram -indeterminate Diastology -Diuretics on hold weight down from 68 to 65 kg, I/O's inaccurate  Colitis, prior concern for GERD Barrett's esophagus ---GI questions ? inflammatory bowel disease - Continue Solu-Medrol daily 40 mg, 1 stool today - rpt Cdiff STILL pending, holding--outpatient Creon 3 times daily + Imodium 2 mg every 4 -  Underwent colonoscopy as above  Hypokalemia/hypomagnesemia - Potassium/magnesium replacement stopped on 3/3-a.m. labs  A-fib CHADVASC >5 last Eliquis dose 8 AM 2/28 - Rate controlled metoprolol, Cardizem currently - Not really a great candidate for digoxin at age 89 - Per GI okay to switch from heparin-->DOAC.today, the pharmacist is aware  -Prior thyroid cancer -Dr. Ralene Ok in the remote past circa 2009 -TSH 4 weeks ago was about 47 and today is 1.04 - continue Synthroid 150 every morning  Exophytic kidney lesion 12 mm up from 9 mm previously - Outpatient MR protocol kidney when patient can hold breath  Depression - Continue sertraline 50-careful and watch mentation   DVT prophylaxis: Heparin prophylactic Code Status: Full  Family Communication: D/W family briefly in the hallway Disposition:  Status is: Observation The patient remains OBS appropriate and will d/c before 2 midnights.   Subjective:  Seen in the Endo suite postprocedure and quite tachycardic in the 140s-had received esmolol metoprolol as well as other meds to control her heart rate Underwent the procedure with propofol She is more awake but is on oxygen  Objective: Vitals:   01/15/23 1424 01/15/23 1434 01/15/23 1444 01/15/23 1541  BP: (!) 100/57 (!) 126/97 122/69 124/88  Pulse: (!) 115 (!) 118 (!) 117 (!) 132  Resp: (!) 27 (!) 23 (!) 28 20  Temp: (!) 97.5 F (36.4 C)   (!) 97.5 F (36.4 C)  TempSrc: Temporal   Oral  SpO2: 100% 96%  94%  Weight:      Height:        Intake/Output Summary (Last 24 hours) at 01/15/2023 1715 Last data filed at 01/15/2023 1426 Gross per 24 hour  Intake 620 ml  Output 12 ml  Net 608 ml    Filed Weights   01/14/23 0500 01/15/23 0721 01/15/23 1302  Weight: 62.8 kg 65.4 kg 65.4  kg    Examination:  Awake coherent no distress S1-S2 no murmur tachycardic, seems to be A-fib Chest clear no rales rhonchi or wheeze Abdomen soft no rebound no guarding No lower extremity  edema Neuro intact   Data Reviewed: personally reviewed   CBC    Component Value Date/Time   WBC 6.5 01/15/2023 0505   RBC 3.39 (L) 01/15/2023 0505   HGB 11.0 (L) 01/15/2023 0505   HGB 13.3 01/10/2021 1133   HGB 12.6 04/14/2009 1107   HCT 34.5 (L) 01/15/2023 0505   HCT 39.2 01/10/2021 1133   HCT 36.9 04/14/2009 1107   PLT 218 01/15/2023 0505   PLT 193 01/10/2021 1133   MCV 101.8 (H) 01/15/2023 0505   MCV 93 01/10/2021 1133   MCV 89.5 04/14/2009 1107   MCH 32.4 01/15/2023 0505   MCHC 31.9 01/15/2023 0505   RDW 13.0 01/15/2023 0505   RDW 13.2 01/10/2021 1133   RDW 13.4 04/14/2009 1107   LYMPHSABS 0.6 (L) 01/10/2023 1704   LYMPHSABS 1.3 01/10/2021 1133   LYMPHSABS 1.0 04/14/2009 1107   MONOABS 0.2 01/10/2023 1704   MONOABS 0.4 04/14/2009 1107   EOSABS 0.0 01/10/2023 1704   EOSABS 0.3 01/10/2021 1133   BASOSABS 0.0 01/10/2023 1704   BASOSABS 0.0 01/10/2021 1133   BASOSABS 0.0 04/14/2009 1107      Latest Ref Rng & Units 01/15/2023    5:05 AM 01/14/2023    5:39 AM 01/13/2023    5:43 AM  CMP  Glucose 70 - 99 mg/dL 123  118  111   BUN 8 - 23 mg/dL '12  20  17   '$ Creatinine 0.44 - 1.00 mg/dL 0.55  0.64  0.67   Sodium 135 - 145 mmol/L 139  140  141   Potassium 3.5 - 5.1 mmol/L 3.7  4.9  4.0   Chloride 98 - 111 mmol/L 103  103  100   CO2 22 - 32 mmol/L '27  30  31   '$ Calcium 8.9 - 10.3 mg/dL 8.6  8.6  8.6      Radiology Studies: No results found.   Scheduled Meds:  (feeding supplement) PROSource Plus  30 mL Oral TID BM   apixaban  5 mg Oral BID   diltiazem  30 mg Oral Q6H   levothyroxine  150 mcg Oral q morning   LORazepam  0.5 mg Oral QHS   methylPREDNISolone (SOLU-MEDROL) injection  40 mg Intravenous Daily   metoprolol tartrate  100 mg Oral BID   multivitamin with minerals  1 tablet Oral Daily   pantoprazole  40 mg Oral Daily   rosuvastatin  10 mg Oral QHS   sertraline  50 mg Oral Daily   sodium chloride flush  3 mL Intravenous Q12H   Continuous  Infusions:     LOS: 4 days   Time spent: 30  Nita Sells, MD [facility] To contact the attending provider between 7A-7P or the covering provider during after hours 7P-7A, please log into the web site [email or web address] and access using universal [place] password for that web site. If you do not have the password, please call the hospital operator.  01/15/2023, 5:15 PM

## 2023-01-16 ENCOUNTER — Other Ambulatory Visit (HOSPITAL_COMMUNITY): Payer: Self-pay

## 2023-01-16 ENCOUNTER — Telehealth: Payer: Self-pay

## 2023-01-16 DIAGNOSIS — K591 Functional diarrhea: Secondary | ICD-10-CM

## 2023-01-16 DIAGNOSIS — E039 Hypothyroidism, unspecified: Secondary | ICD-10-CM

## 2023-01-16 DIAGNOSIS — I10 Essential (primary) hypertension: Secondary | ICD-10-CM

## 2023-01-16 DIAGNOSIS — R197 Diarrhea, unspecified: Secondary | ICD-10-CM | POA: Diagnosis not present

## 2023-01-16 DIAGNOSIS — G629 Polyneuropathy, unspecified: Secondary | ICD-10-CM

## 2023-01-16 DIAGNOSIS — H47019 Ischemic optic neuropathy, unspecified eye: Secondary | ICD-10-CM

## 2023-01-16 LAB — BASIC METABOLIC PANEL
Anion gap: 8 (ref 5–15)
BUN: 19 mg/dL (ref 8–23)
CO2: 27 mmol/L (ref 22–32)
Calcium: 8.4 mg/dL — ABNORMAL LOW (ref 8.9–10.3)
Chloride: 103 mmol/L (ref 98–111)
Creatinine, Ser: 0.64 mg/dL (ref 0.44–1.00)
GFR, Estimated: >60 mL/min (ref >60)
Glucose, Bld: 157 mg/dL — ABNORMAL HIGH (ref 70–99)
Potassium: 3.9 mmol/L (ref 3.5–5.1)
Sodium: 138 mmol/L (ref 135–145)

## 2023-01-16 LAB — GLUCOSE, CAPILLARY
Glucose-Capillary: 153 mg/dL — ABNORMAL HIGH (ref 70–99)
Glucose-Capillary: 174 mg/dL — ABNORMAL HIGH (ref 70–99)

## 2023-01-16 LAB — SURGICAL PATHOLOGY

## 2023-01-16 MED ORDER — DILTIAZEM HCL ER COATED BEADS 240 MG PO CP24
360.0000 mg | ORAL_CAPSULE | Freq: Every day | ORAL | Status: DC
  Administered 2023-01-16: 360 mg via ORAL
  Filled 2023-01-16: qty 1

## 2023-01-16 MED ORDER — PREDNISONE 10 MG PO TABS
ORAL_TABLET | ORAL | 0 refills | Status: DC
  Filled 2023-01-16: qty 159, 81d supply, fill #0

## 2023-01-16 MED ORDER — PREDNISONE 20 MG PO TABS: 40.0000 mg | ORAL_TABLET | Freq: Every day | ORAL | Status: DC

## 2023-01-16 MED ORDER — PREDNISONE 10 MG PO TABS
ORAL_TABLET | ORAL | 0 refills | Status: DC
  Filled 2023-01-16: qty 131, 53d supply, fill #0

## 2023-01-16 MED ORDER — DILTIAZEM HCL ER COATED BEADS 360 MG PO CP24
360.0000 mg | ORAL_CAPSULE | Freq: Every day | ORAL | 11 refills | Status: DC
  Filled 2023-01-16: qty 30, 30d supply, fill #0

## 2023-01-16 MED ORDER — PREDNISONE 10 MG PO TABS
10.0000 mg | ORAL_TABLET | Freq: Every day | ORAL | 0 refills | Status: DC
  Filled 2023-01-16: qty 30, 30d supply, fill #0

## 2023-01-16 MED ORDER — PANTOPRAZOLE SODIUM 40 MG PO TBEC
40.0000 mg | DELAYED_RELEASE_TABLET | Freq: Every day | ORAL | 11 refills | Status: DC
  Filled 2023-01-16: qty 30, 30d supply, fill #0
  Filled 2023-02-04 – 2023-02-08 (×4): qty 30, 30d supply, fill #1

## 2023-01-16 MED ORDER — DILTIAZEM HCL ER COATED BEADS 360 MG PO CP24
360.0000 mg | ORAL_CAPSULE | Freq: Every day | ORAL | 3 refills | Status: DC
  Filled 2023-01-16 (×2): qty 30, 30d supply, fill #0
  Filled 2023-02-04 – 2023-02-08 (×4): qty 30, 30d supply, fill #1

## 2023-01-16 NOTE — Discharge Summary (Addendum)
Physician Discharge Summary  [name] [id number] DOB: May 27, 1931 DOA: 01/10/2023  PCP: Corliss Blacker, MD  Admit date: 01/10/2023 Discharge date: 01/16/2023  Time spent: 47 minutes  Recommendations for Outpatient Follow-up:  Requires follow-up of biopsies in the outpatient setting with GI and taper of steroids as per GI Recommend outpatient follow-up for thyroid cancer with a TSH in about 3 to 5 weeks Needs TOC visit with cardiology given start on Cardizem 360 CD this visit---A-fib and not a candidate for several meds in the past as per Dr. Sinclair Ship Eliquis in an 89 year old as per cardiology as outpatient--also was on lasix this admit but stoppe don d/c--may need re-initiation as OP @ TOC Recommend MRI as an outpatient for exophytic kidney lesion as per PCP and discussions regarding the same  Discharge Diagnoses:  MAIN problem for hospitalization   Colitis likely microscopic AECHF likely secondary to fluid resuscitation now resolved not on Lasix on discharge per cardiology A-fib RVR earlier this admission Needs TSH for thyroid cancer and potential outpatient follow-up depending on Exophytic kidney lesion  Please see below for itemized issues addressed in [place]- refer to other progress notes for clarity if needed  Discharge Condition: Fair  Diet recommendation: Heart healthy  Filed Weights   01/15/23 0721 01/15/23 1302 01/16/23 0715  Weight: 65.4 kg 65.4 kg 59.9 kg    History of present illness:  62 wf Perm AFIB (since 2016) Chad2vasc2>4 [failed tikosyn/amiodarone--DCCV several times]--on BB --see OV note 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 Carles Collet in 2019 L4-L5 lumbar decompression in 2017 by neurosurgeon Dr. Sherwood Gambler  Recent Hosp  1/29-->12/14/22 Fall-stools ~5 times a day-outpatient workup = suggestive of inflammatory disorders /noninfective Metformin was discontinued secondary to the diarrhea    presented from Dr. Carlean Purl office visit 2/27 -- subacute presentation of diarrhea several times a day (up to 6-explosive-watery) Had been on Creon with no improvement   Bolus IVF in ED Dr. Lorenso Courier GI consulted ---gave IV Solu-Medrol   Overnight 2/29 patient became more tachycardic dyspneic and developed new oxygen requirement 3/1 cardiology consulted, echo no WM anomaly, EF 65% 3/4 colonoscopy Dr. Charma Igo present internal hemorrhoids moderate-sized-also cold biopsies performed for workup of microscopic colitis  Hospital Course:  acute hypoxic respiratory failure overload-diastolic parameters on current echo difficult to tell  presume AECHF diastolic Cardiology has signed off  current meds : As below - Echocardiogram -indeterminate Diastology   Colitis, prior concern for GERD Barrett's esophagus ---GI questions ? inflammatory bowel disease - steroid taper as per GI who have signed off - No Creon-can continue Imodium - Underwent colonoscopy as above --follow results in OP setting   Hypokalemia/hypomagnesemia - Potassium/magnesium replacement stopped on 3/3-a.m. labs   A-fib CHADVASC >5 last Eliquis dose 8 AM 2/28 - metoprolol 100 twice daily, Cardizem adjusted to 360 CD ---no tach cardia later in the day so stable for discharge - Not really a great candidate for digoxin at age 44 - Resumed on Eliquis 5 twice daily   -Prior thyroid cancer -Dr. Ralene Ok in the remote past circa 2009 -TSH 4 weeks ago was about 73 and today is 1.04 - continue Synthroid 150 every morning -May be a nidus for A-fib so may need subtle dosage adjustments as an outpatient   Exophytic kidney lesion 12 mm up from 9 mm previously - Outpatient MR  protocol kidney when patient can hold breath   Depression - Continue sertraline 50  carefully     Discharge Exam: Vitals:   01/16/23 0507 01/16/23 0909  BP: (!) 138/96 128/86  Pulse: 92 74  Resp: 17 (!) 21  Temp: (!) 97.3 F (36.3 C) (!) 97.5 F (36.4 C)  SpO2: 95% 99%    Subj on day of d/c   Well coherent awake alert no distress seems comfortable I took her off oxygen and she did not desat  General Exam on discharge  EOMI NCAT no focal deficit Abdomen soft no rebound no guarding Chest seems clear to my exam Abd soft nt nd no rebound no gaurd I cannot appreciate JVD ROM is intact Straight leg raise bilaterally equal  Discharge Instructions    Allergies as of 01/16/2023       Reactions   Penicillins Itching, Rash, Other (See Comments)   Did it involve swelling of the face/tongue/throat, SOB, or low BP? No Did it involve sudden or severe rash/hives, skin peeling, or any reaction on the inside of your mouth or nose? No Did you need to seek medical attention at a hospital or doctor's office? No When did it last happen?    30+ years   If all above answers are "NO", may proceed with cephalosporin use.        Medication List     STOP taking these medications    Accu-Chek Guide Me w/Device Kit   cetirizine 10 MG tablet Commonly known as: ZYRTEC   lipase/protease/amylase 36000 UNITS Cpep capsule Commonly known as: Creon   potassium chloride 10 MEQ tablet Commonly known as: KLOR-CON M       TAKE these medications    Accu-Chek Guide test strip Generic drug: glucose blood   acetaminophen 650 MG CR tablet Commonly known as: TYLENOL Take 650 mg by mouth in the morning and at bedtime.   apixaban 5 MG Tabs tablet Commonly known as: Eliquis Take 1 tablet (5 mg total) by mouth 2 (two) times daily.   cholecalciferol 25 MCG (1000 UT) tablet Generic drug: Cholecalciferol Take 1,000 Units by mouth daily.   diltiazem 360 MG 24 hr capsule Commonly known as: CARDIZEM CD Take 1 capsule (360 mg total) by mouth daily. Start taking on: January 17, 2023   ferrous sulfate 325 (65 FE) MG tablet Take 325 mg by mouth daily with breakfast.   levothyroxine 150 MCG tablet Commonly known as: SYNTHROID Take 150 mcg by mouth every morning.   loperamide 2 MG capsule Commonly known as: IMODIUM Take 1 capsule (2 mg total) by mouth every 4 (four) hours.   LORazepam 1 MG tablet Commonly known as: ATIVAN Take 0.5 mg by mouth at bedtime. Take additional 0.5 mg if needed   metoprolol tartrate 100 MG tablet Commonly known as: LOPRESSOR TAKE 1 TABLET(100 MG) BY MOUTH TWICE DAILY What changed: See the new instructions.   pantoprazole 40 MG tablet Commonly known as: PROTONIX Take 1 tablet (40 mg total) by mouth daily.   Polyvinyl Alcohol-Povidone PF 1.4-0.6 % Soln Place 1 drop into both eyes daily as needed (dry eyes).   predniSONE 10 MG tablet Commonly known as: DELTASONE Take 1 tablet (10 mg total) by mouth daily. What changed: See the new instructions.   predniSONE 10 MG tablet Commonly known as: DELTASONE Take 4 tablets (40 mg total) by mouth daily with breakfast for 2 days, THEN 3.5 tablets (35 mg total) daily with breakfast for 7 days, THEN  3 tablets (30 mg total) daily with breakfast for 7 days, THEN 2.5 tablets (25 mg total) daily with breakfast for 7 days, THEN 2 tablets (20 mg total) daily with breakfast for 30 days, THEN 1.5 tablets (15 mg total) daily with breakfast for 7 days, THEN 1 tablet (10 mg total) daily with breakfast for 7 days, THEN 1 tablet (10 mg total) daily with breakfast for 7 days, THEN 0.5 tablets (5 mg total) daily with breakfast for 7 days. Start taking on: January 17, 2023 What changed: You were already taking a medication with the same name, and this prescription was added. Make sure you understand how and when to take each.   rosuvastatin 10 MG tablet Commonly known as: CRESTOR Take 10 mg by mouth at bedtime.   sertraline 50 MG tablet Commonly known as: ZOLOFT Take 50 mg by mouth daily.   vitamin B-12  500 MCG tablet Commonly known as: CYANOCOBALAMIN Take 500 mcg by mouth daily.   zinc gluconate 50 MG tablet Take 50 mg by mouth daily.               Durable Medical Equipment  (From admission, onward)           Start     Ordered   01/16/23 1332  DME Oxygen  Once       Question Answer Comment  Length of Need Lifetime   Mode or (Route) Nasal cannula   Liters per Minute 1   Oxygen delivery system Gas      01/16/23 1331           Allergies  Allergen Reactions   Penicillins Itching, Rash and Other (See Comments)    Did it involve swelling of the face/tongue/throat, SOB, or low BP? No Did it involve sudden or severe rash/hives, skin peeling, or any reaction on the inside of your mouth or nose? No Did you need to seek medical attention at a hospital or doctor's office? No When did it last happen?    30+ years   If all above answers are "NO", may proceed with cephalosporin use.       The results of significant diagnostics from this hospitalization (including imaging, microbiology, ancillary and laboratory) are listed below for reference.    Significant Diagnostic Studies: ECHOCARDIOGRAM COMPLETE  Result Date: 01/12/2023    ECHOCARDIOGRAM REPORT   Patient Name:   KAYDRA GELLNER Date of Exam: 01/12/2023 Medical Rec #:  [id number]        Height:       65.0 in Accession #:    [id number]       Weight:       150.0 lb Date of Birth:  Jun 28, 1931         BSA:          1.750 m Patient Age:    26 years         BP:           123/81 mmHg Patient Gender: F                HR:           102 bpm. Exam Location:  Inpatient Procedure: 2D Echo, Color Doppler and Cardiac Doppler Indications:    Congestive Heart Failure I50.9  History:        Patient has prior history of Echocardiogram examinations, most                 recent 07/21/2020. Arrythmias:Atrial Fibrillation,  Signs/Symptoms:Murmur; Risk Factors:Non-Smoker, Hypertension,                 Sleep Apnea and Diabetes.   Sonographer:    Greer Pickerel Referring Phys: [phone] [facility]  Sonographer Comments: No subcostal window. Image acquisition challenging due to patient body habitus and Image acquisition challenging due to respiratory motion. IMPRESSIONS  1. Left ventricular ejection fraction, by estimation, is 65%. The left ventricle has normal function. Left ventricular endocardial border not optimally defined to evaluate regional wall motion. There is moderate left ventricular hypertrophy of the basal-septal segment. Left ventricular diastolic parameters are indeterminate.  2. Right ventricular systolic function is normal. The right ventricular size is normal.  3. Left atrial size was severely dilated.  4. Right atrial size was mildly dilated.  5. The mitral valve is degenerative. Mild mitral valve regurgitation. Moderate mitral annular calcification.  6. Tricuspid valve regurgitation is mild to moderate.  7. The aortic valve is tricuspid. Aortic valve regurgitation is mild.  8. Aortic dilatation noted. There is mild dilatation of the aortic root, measuring 40 mm. FINDINGS  Left Ventricle: Left ventricular ejection fraction, by estimation, is 65%. The left ventricle has normal function. Left ventricular endocardial border not optimally defined to evaluate regional wall motion. The left ventricular internal cavity size was normal in size. There is moderate left ventricular hypertrophy of the basal-septal segment. Left ventricular diastolic parameters are indeterminate. Right Ventricle: The right ventricular size is normal. No increase in right ventricular wall thickness. Right ventricular systolic function is normal. Left Atrium: Left atrial size was severely dilated. Right Atrium: Right atrial size was mildly dilated. Pericardium: There is no evidence of pericardial effusion. Mitral Valve: The mitral valve is degenerative in appearance. Moderate mitral annular calcification. Mild mitral valve regurgitation. Tricuspid  Valve: The tricuspid valve is grossly normal. Tricuspid valve regurgitation is mild to moderate. No evidence of tricuspid stenosis. Aortic Valve: The aortic valve is tricuspid. Aortic valve regurgitation is mild. Aortic regurgitation PHT measures 545 msec. Pulmonic Valve: The pulmonic valve was not well visualized. Pulmonic valve regurgitation is trivial. No evidence of pulmonic stenosis. Aorta: Aortic dilatation noted. There is mild dilatation of the aortic root, measuring 40 mm. IAS/Shunts: The interatrial septum was not well visualized.  LEFT VENTRICLE PLAX 2D LVIDd:         4.10 cm   Diastology LVIDs:         2.70 cm   LV e' medial:    5.78 cm/s LV PW:         1.00 cm   LV E/e' medial:  22.1 LV IVS:        1.10 cm   LV e' lateral:   6.83 cm/s LVOT diam:     1.60 cm   LV E/e' lateral: 18.7 LV SV:         23 LV SV Index:   13 LVOT Area:     2.01 cm  LEFT ATRIUM              Index        RIGHT ATRIUM           Index LA diam:        4.30 cm  2.46 cm/m   RA Area:     25.20 cm LA Vol (A2C):   120.0 ml 68.55 ml/m  RA Volume:   70.00 ml  39.99 ml/m LA Vol (A4C):   148.0 ml 84.55 ml/m LA Biplane Vol: 147.0 ml 83.98 ml/m  AORTIC VALVE             PULMONIC VALVE LVOT Vmax:   66.60 cm/s  PR End Diast Vel: 2.29 msec LVOT Vmean:  55.100 cm/s LVOT VTI:    0.115 m AI PHT:      545 msec  AORTA Ao Root diam: 4.00 cm MITRAL VALVE                TRICUSPID VALVE MV Area (PHT): 5.62 cm     TR Peak grad:   30.7 mmHg MV Decel Time: 135 msec     TR Vmax:        277.00 cm/s MR Peak grad: 89.1 mmHg MR Vmax:      472.00 cm/s   SHUNTS MV E velocity: 128.00 cm/s  Systemic VTI:  0.12 m                             Systemic Diam: 1.60 cm Rudean Haskell MD Electronically signed by Rudean Haskell MD Signature Date/Time: 01/12/2023/2:27:19 PM    Final    DG CHEST PORT 1 VIEW  Result Date: 01/11/2023 CLINICAL DATA:  Pleural effusion EXAM: PORTABLE CHEST 1 VIEW COMPARISON:  12/07/2022 FINDINGS: Prominent cardiac silhouette.  Small pleural effusion on the left and moderate pleural effusion right. Pulmonary vascular congestion consistent with pulmonary edema. Mild bibasilar consolidation. Calcified tortuous aorta. Postop changes right axilla. IMPRESSION: Findings consistent with CHF. Moderate pleural effusion on the right and small pleural effusion on the left. Electronically Signed   By: Sammie Bench M.D.   On: 01/11/2023 09:06   CT ABDOMEN PELVIS W CONTRAST  Result Date: 01/10/2023 CLINICAL DATA:  Bowel obstruction suspected Diarrhea and generalized weakness. EXAM: CT ABDOMEN AND PELVIS WITH CONTRAST TECHNIQUE: Multidetector CT imaging of the abdomen and pelvis was performed using the standard protocol following bolus administration of intravenous contrast. RADIATION DOSE REDUCTION: This exam was performed according to the departmental dose-optimization program which includes automated exposure control, adjustment of the mA and/or kV according to patient size and/or use of iterative reconstruction technique. CONTRAST:  70m OMNIPAQUE IOHEXOL 300 MG/ML  SOLN COMPARISON:  CT 03/23/2022 FINDINGS: Lower chest: Moderate right and small left pleural effusion. Associated compressive atelectasis. Cardiomegaly with primarily right heart dilatation. Wall thickening of the distal esophagus with small hiatal hernia. Hepatobiliary: Stable small low-density lesions are too small to accurately characterize but likely cysts. No new liver lesions. Layering gallstones in the gallbladder. No pericholecystic inflammation. No biliary dilatation. Pancreas: Mild fatty atrophy.  No ductal dilatation or inflammation. Spleen: Normal in size without focal abnormality. Adrenals/Urinary Tract: No adrenal nodule. Symmetric prominence of both renal collecting systems without ureteral dilatation. There are bilateral simple cysts that need no further follow-up. Indeterminate exophytic lesion from the upper pole of the left kidney measures 12 mm, series 2, image  23, previously 9 mm. Partially distended urinary bladder. Stomach/Bowel: Moderate-sized hiatal hernia with wall thickening of the distal esophagus. Air in fluid within the stomach without gastric wall thickening. No small bowel obstruction or inflammatory change. Minimal fluid within distal small bowel loops in the pelvis. The appendix is not confidently seen. Mild wall thickening of the ascending colon, series 2, image 55. Formed stool in the remainder of the ascending colon. Air-filled transverse colon that is mildly prominent. Mixed liquid and solid stool in the descending colon. Small amount of formed stool in the sigmoid colon. Mild sigmoid colonic diverticulosis without diverticulitis. Rectum is distended with  air and a small amount of mixed liquid and solid stool. No obvious colonic mass. Vascular/Lymphatic: Aortic atherosclerosis and tortuosity. No aneurysm. No acute vascular findings. No adenopathy. Reproductive: Hysterectomy. No adnexal mass. Other: No ascites or free fluid. No free intra-abdominal air. There is generalized subcutaneous body wall edema. Fat in both inguinal canals. Musculoskeletal: Posterior L4-L5 fusion hardware. Degenerative change of both hips and the pubic symphysis. There are no acute or suspicious osseous abnormalities. IMPRESSION: 1. Mild wall thickening of the ascending colon, possible colitis. 2. Moderate-sized hiatal hernia with wall thickening of the distal esophagus, can be seen with reflux or esophagitis. 3. Moderate right and small left pleural effusions with compressive atelectasis. Generalized subcutaneous body wall edema. 4. Cholelithiasis without CT findings of acute cholecystitis. 5. Indeterminate exophytic lesion from the upper pole of the left kidney measures 12 mm, previously 9 mm. Recommend nonemergent renal protocol MRI for characterization after resolution of acute event, preferably as an outpatient when patient can tolerate breath hold technique. 6. Mild sigmoid  colonic diverticulosis without diverticulitis. Aortic Atherosclerosis (ICD10-I70.0). Electronically Signed   By: Keith Rake M.D.   On: 01/10/2023 21:15   Abd 1 View (KUB)  Result Date: 01/10/2023 CLINICAL DATA:  Chronic diarrhea. EXAM: ABDOMEN - 1 VIEW COMPARISON:  None Available. FINDINGS: Generalized gaseous distention of colon is seen without significant small bowel dilatation. Lumbar spine fusion hardware noted. IMPRESSION: Generalized gaseous distention of colon. Differential diagnosis includes colonic ileus and distal colonic obstruction. Consider abdomen pelvis CT for further evaluation if clinically warranted. Electronically Signed   By: Marlaine Hind M.D.   On: 01/10/2023 19:53    Microbiology: Recent Results (from the past 240 hour(s))  C Difficile Quick Screen w PCR reflex     Status: None   Collection Time: 01/12/23 10:19 AM   Specimen: STOOL  Result Value Ref Range Status   C Diff antigen NEGATIVE NEGATIVE Final   C Diff toxin NEGATIVE NEGATIVE Final   C Diff interpretation No C. difficile detected.  Final    Comment: Performed at [facility], [place] [address], [place], [place] [address]     Labs: Basic Metabolic Panel: Recent Labs  Lab 01/12/23 0602 01/13/23 0543 01/14/23 0539 01/15/23 0505 01/16/23 0537  NA 139 141 140 139 138  K 3.0* 4.0 4.9 3.7 3.9  CL 100 100 103 103 103  CO2 '31 31 30 27 27  '$ GLUCOSE 114* 111* 118* 123* 157*  BUN '16 17 20 12 19  '$ CREATININE 0.68 0.67 0.64 0.55 0.64  CALCIUM 8.1* 8.6* 8.6* 8.6* 8.4*  MG 1.5* 1.6* 2.1 2.0  --   PHOS  --   --  3.1  --   --    Liver Function Tests: Recent Labs  Lab 01/10/23 1704 01/12/23 0602 01/14/23 0539  AST 29 13*  --   ALT 14 11  --   ALKPHOS 55 48  --   BILITOT 1.2 0.6  --   PROT 6.3* 5.6*  --   ALBUMIN 3.1* 2.7* 3.0*   Recent Labs  Lab 01/10/23 1704  LIPASE 35   No results for input(s): "AMMONIA" in the last 168 hours. CBC: Recent Labs  Lab 01/10/23 1704  01/11/23 0540 01/13/23 0543 01/14/23 0539 01/15/23 0505  WBC 5.3 5.5 7.2 7.3 6.5  NEUTROABS 4.5  --   --   --   --   HGB 11.4* 10.8* 10.9* 11.4* 11.0*  HCT 35.2* 32.9* 33.9* 34.6* 34.5*  MCV 100.9* 100.9* 102.1*  102.4* 101.8*  PLT 230 213 239 213 218   Cardiac Enzymes: No results for input(s): "CKTOTAL", "CKMB", "CKMBINDEX", "TROPONINI" in the last 168 hours. BNP: BNP (last 3 results) Recent Labs    01/11/23 0951  BNP 468.9*    ProBNP (last 3 results) No results for input(s): "PROBNP" in the last 8760 hours.  CBG: Recent Labs  Lab 01/15/23 0842 01/15/23 1135 01/15/23 1738 01/15/23 2112 01/16/23 0733  GLUCAP 161* 140* 246* 249* 153*       Signed:  Nita Sells MD   [facility] 01/16/2023, 9:59 AM

## 2023-01-16 NOTE — TOC Transition Note (Signed)
Transition of Care [facility]) - CM/SW Discharge Note   Patient Details  Name: [name] MRN: [id number] Date of Birth: 23-Nov-1930  [facility] CM/SW Contact:  Vassie Moselle, [place] Phone Number: 01/16/2023, 2:10 PM   Clinical Narrative:    Pt to return home at discharge with home with home health services. Pt will continue to receive HH PT,OT,RN w/ [facility]. Pt also receives 24/7 care through [facility]. Has RW. No DME needs.    Final next level of care: [place] Barriers to Discharge: Barriers Resolved   Patient Goals and CMS Choice   Choice offered to / list presented to : Adult Children (Daughter; Shirlean Mylar)  Discharge Placement                         Discharge Plan and Services Additional resources added to the After Visit Summary for     Discharge Planning Services: CM Consult Post Acute Care Choice: Home Health, Resumption of Svcs/PTA Provider          DME Arranged: N/A         HH Arranged: RN, OT, PT [place] Agency: [place]        Social Determinants of Health (SDOH) Interventions SDOH Screenings   Food Insecurity: No Food Insecurity (01/14/2023)  Housing: Low Risk  (01/15/2023)  Tobacco Use: Low Risk  (01/15/2023)     Readmission Risk Interventions    01/15/2023    8:56 AM  Readmission Risk Prevention Plan  Transportation Screening Complete  PCP or Specialist Appt within 3-5 Days Complete  HRI or [place] Complete  Social Work Consult for [place] Planning/Counseling Complete  Palliative Care Screening Not Applicable  Medication Review Press photographer) Complete

## 2023-01-16 NOTE — Telephone Encounter (Signed)
Patient has been scheduled for a 6-week hospital follow up with Dr. Carlean Purl on Tuesday, 02/27/23 at 1:30 pm. Appt information mailed to patient.

## 2023-01-16 NOTE — Progress Notes (Signed)
Physical Therapy Treatment Patient Details Name: [name] MRN: [id number] DOB: 1931/07/19 Today's Date: 01/16/2023   History of Present Illness [name] is a 89 y.o. female with medical history significant for persistent A-fib on Eliquis, T2DM, HTN, hypothyroidism, HLD, GERD, IDA, s/p hiatal hernia repair and Nissen fundoplication [id number], depression/anxiety who presented to the ED per her GI specialist recommendation for further evaluation and management of generalized weakness associated with persistent diarrhea. Pt presenting with dec o2 sats and afib. Pt s/p colonoscopy 01/15/23.    PT Comments    Pt up in recliner with caregiver at bedside upon arrival. Pt amb 60 ft with standard walker, min guard initially progressing to supv, reaching walker slightly further than cued but good steadiness and no physical assist needed. Therapist managing O2 line, pt on 2L with SpO2 100% and HR 110mx noted. Caregiver at bedside reports pt appears like normal and pt also reports feeling more like normal.   Recommendations for follow up therapy are one component of a multi-disciplinary discharge planning process, led by the attending physician.  Recommendations may be updated based on patient status, additional functional criteria and insurance authorization.  Follow Up Recommendations  Home health PT     Assistance Recommended at Discharge Frequent or constant Supervision/Assistance  Patient can return home with the following Assistance with cooking/housework;Assist for transportation;Help with stairs or ramp for entrance;A little help with walking and/or transfers;A little help with bathing/dressing/bathroom   Equipment Recommendations  None recommended by PT    Recommendations for Other Services       Precautions / Restrictions Precautions Precautions: Fall Precaution Comments: monitor HR Restrictions Weight Bearing Restrictions: No     Mobility  Bed Mobility  General bed mobility  comments: in recliner    Transfers Overall transfer level: Needs assistance Equipment used: Standard walker Transfers: Sit to/from Stand Sit to Stand: Supervision  General transfer comment: pt powers to stand with BUE assisting, supv to power up, therapist managing O2 line    Ambulation/Gait Ambulation/Gait assistance: Supervision, Min guard Gait Distance (Feet): 60 Feet Assistive device: Standard walker Gait Pattern/deviations: Step-through pattern, Decreased stride length Gait velocity: decreased  General Gait Details: step through gait pattern with standard walker, pt reaching standard walker slightly further than recommended but good steadiness and no physical assist needed   Stairs             Wheelchair Mobility    Modified Rankin (Stroke Patients Only)       Balance Overall balance assessment: Needs assistance  Standing balance support: During functional activity, Reliant on assistive device for balance, Bilateral upper extremity supported Standing balance-Leahy Scale: Poor Standing balance comment: standard walker     Cognition Arousal/Alertness: Awake/alert Behavior During Therapy: WFL for tasks assessed/performed Overall Cognitive Status: Within Functional Limits for tasks assessed  General Comments: pt's caregiver at bedside        Exercises      General Comments General comments (skin integrity, edema, etc.): pt on 2L O2 with SpO2 100% and HR 90s with amb; trialed RA once seated in recliner after amb and SpO2 99% and HR 87; returned 2L O2 and notified RN      Pertinent Vitals/Pain Pain Assessment Pain Assessment: No/denies pain    Home Living                          Prior Function            PT Goals (  current goals can now be found in the care plan section) Acute Rehab PT Goals Patient Stated Goal: to return home PT Goal Formulation: With patient Time For Goal Achievement: 01/25/23 Potential to Achieve Goals:  Good Progress towards PT goals: Progressing toward goals    Frequency    Min 3X/week      PT Plan Current plan remains appropriate    Co-evaluation              AM-PAC PT "6 Clicks" Mobility   Outcome Measure  Help needed turning from your back to your side while in a flat bed without using bedrails?: A Little Help needed moving from lying on your back to sitting on the side of a flat bed without using bedrails?: A Little Help needed moving to and from a bed to a chair (including a wheelchair)?: A Little Help needed standing up from a chair using your arms (e.g., wheelchair or bedside chair)?: A Little Help needed to walk in hospital room?: A Little Help needed climbing 3-5 steps with a railing? : A Little 6 Click Score: 18    End of Session Equipment Utilized During Treatment: Gait belt Activity Tolerance: Patient tolerated treatment well Patient left: in chair;with call bell/phone within reach;with family/visitor present Nurse Communication: Mobility status PT Visit Diagnosis: Muscle weakness (generalized) (M62.81);Unsteadiness on feet (R26.81)     Time: [id number] PT Time Calculation (min) (ACUTE ONLY): 19 min  Charges:  $Gait Training: 8-22 mins                      Tori Rino Hosea PT, DPT 01/16/23, 10:29 AM

## 2023-01-16 NOTE — Telephone Encounter (Signed)
-----   Message from Levin Erp, [place] sent at 01/16/2023 10:49 AM EST ----- Regarding: Follow-up Can you make sure this patient has follow-up with Dr. Carlean Purl in 4 to 8 weeks.  Thanks, JLL

## 2023-01-16 NOTE — Anesthesia Postprocedure Evaluation (Signed)
Anesthesia Post Note  Patient: [name]  Procedure(s) Performed: COLONOSCOPY BIOPSY     Patient location during evaluation: Endoscopy Anesthesia Type: MAC Level of consciousness: oriented, awake and alert and awake Pain management: pain level controlled Vital Signs Assessment: post-procedure vital signs reviewed and stable Respiratory status: spontaneous breathing, nonlabored ventilation, respiratory function stable and patient connected to nasal cannula oxygen Cardiovascular status: blood pressure returned to baseline and stable Postop Assessment: no headache, no backache and no apparent nausea or vomiting Anesthetic complications: no   No notable events documented.  Last Vitals:  Vitals:   01/16/23 0909 01/16/23 1315  BP: 128/86 93/64  Pulse: 74 97  Resp: (!) 21 20  Temp: (!) 36.4 C (!) 36.3 C  SpO2: 99% 98%    Last Pain:  Vitals:   01/16/23 1315  TempSrc: Oral  PainSc:                  [name]

## 2023-01-16 NOTE — Progress Notes (Addendum)
    Progress Note   Subjective  Chief Complaint: Chronic diarrhea  Status post colonoscopy 01/15/2023.  Biopsies taken to consider microscopic colitis.  These are pending.  Patient reports that she is much better.  Her diarrhea has greatly improved since her hospitalization and she is ready to go home.  No new complaints today.   Objective   Vital signs in last 24 hours: Temp:  [97.3 F (36.3 C)-98.3 F (36.8 C)] 97.5 F (36.4 C) (03/05 0909) Pulse Rate:  [44-132] 74 (03/05 0909) Resp:  [17-28] 21 (03/05 0909) BP: (100-157)/(57-97) 128/86 (03/05 0909) SpO2:  [94 %-100 %] 99 % (03/05 0909) Weight:  [59.9 kg-65.4 kg] 59.9 kg (03/05 0715) Last BM Date : 01/14/23 General:    Elderly white female in NAD Heart:  Regular rate and rhythm; no murmurs Lungs: Respirations even and unlabored, lungs CTA bilaterally Abdomen:  Soft, nontender and nondistended. Normal bowel sounds. Psych:  Cooperative. Normal mood and affect.  Intake/Output from previous day: 03/04 0701 - 03/05 0700 In: 400 [I.V.:400] Out: -    Lab Results: Recent Labs    01/14/23 0539 01/15/23 0505  WBC 7.3 6.5  HGB 11.4* 11.0*  HCT 34.6* 34.5*  PLT 213 218   BMET Recent Labs    01/14/23 0539 01/15/23 0505 01/16/23 0537  NA 140 139 138  K 4.9 3.7 3.9  CL 103 103 103  CO2 '30 27 27  '$ GLUCOSE 118* 123* 157*  BUN '20 12 19  '$ CREATININE 0.64 0.55 0.64  CALCIUM 8.6* 8.6* 8.4*   LFT Recent Labs    01/14/23 0539  ALBUMIN 3.0*    Assessment / Plan:   Assessment: 1.  Chronic diarrhea: With abnormal CT scan and elevated calprotectin (improved), colonoscopy yesterday with biopsies pending, patient's symptoms are improved on IV steroids here, previously a nonresponder to Creon 2.  Hypertension/acute on chronic diastolic heart failure/PAF 3.  Acute respiratory failure secondary to diastolic heart failure 4.  Hypothyroidism 5.  Depression/anxiety 6.  Peripheral neuropathy 7.  Optic ischemic neuropathy 8.   History of hiatal hernia status post Nissen fundoplication 9.  Lumbar decompression L4-5  Plan: 1.  Patient will be made aware of biopsy results when we receive them. 2.  We are okay with patient's discharge home today.  Her anticoagulation can be restarted. 3.  As far as steroid taper.  Would recommend a total of 7 days of 40 mg, this is her fifth day of IV steroids here in the hospital.  She will need 2 more days of 40 mg and then taper down by 5 mg/week until finished. 4.  We will get patient to follow-up in clinic to see Dr. Carlean Purl in 4 to 6 weeks.  Thank you for your kind consultation, we will sign off.   LOS: 5 days   Levin Erp  01/16/2023, 10:42 AM  GI ATTENDING  Interval history data reviewed.  The patient was doing better in terms of her diarrhea, nonspecifically.  Her biopsies just came back this afternoon (after the patient was discharged) and were unremarkable.  No evidence of microscopic colitis.  She had been placed on steroids empirically prior to her colonoscopy.  She was sent home on a steroid taper.  Will contact patient and have her discontinue steroids.  She will follow-up outpatient with Dr. Carlean Purl.  I have notified Dr. Carlean Purl with this update.  Docia Chuck. Geri Seminole., M.D. [facility] Division of Gastroenterology

## 2023-01-17 ENCOUNTER — Telehealth: Payer: Self-pay | Admitting: Internal Medicine

## 2023-01-17 NOTE — Telephone Encounter (Signed)
Spoke to daughter re: treatment plan  Though colon biopsies were normal she did have an elevated fecal calprotectin and may have responded to the prednisone (though it was quite quick).  Patient is doing well at this time  Have decided to continue prednisone taper and keep f/u me 4/16

## 2023-01-18 ENCOUNTER — Encounter (HOSPITAL_COMMUNITY): Payer: Self-pay | Admitting: Internal Medicine

## 2023-01-21 ENCOUNTER — Inpatient Hospital Stay (HOSPITAL_COMMUNITY)
Admission: EM | Admit: 2023-01-21 | Discharge: 2023-01-24 | DRG: 880 | Disposition: A | Discharge status: Home / Self Care | Payer: Medicare PPO | Attending: Internal Medicine | Admitting: Internal Medicine

## 2023-01-21 ENCOUNTER — Other Ambulatory Visit: Payer: Self-pay

## 2023-01-21 ENCOUNTER — Emergency Department (HOSPITAL_BASED_OUTPATIENT_CLINIC_OR_DEPARTMENT_OTHER)
Admit: 2023-01-21 | Discharge: 2023-01-21 | Disposition: A | Discharge status: Home / Self Care | Payer: Medicare PPO | Attending: Emergency Medicine | Admitting: Emergency Medicine

## 2023-01-21 ENCOUNTER — Emergency Department (HOSPITAL_COMMUNITY): Payer: Medicare PPO

## 2023-01-21 ENCOUNTER — Encounter (HOSPITAL_COMMUNITY): Payer: Self-pay

## 2023-01-21 DIAGNOSIS — I4819 Other persistent atrial fibrillation: Secondary | ICD-10-CM | POA: Diagnosis present

## 2023-01-21 DIAGNOSIS — K219 Gastro-esophageal reflux disease without esophagitis: Secondary | ICD-10-CM | POA: Diagnosis present

## 2023-01-21 DIAGNOSIS — F411 Generalized anxiety disorder: Secondary | ICD-10-CM | POA: Diagnosis present

## 2023-01-21 DIAGNOSIS — Z79899 Other long term (current) drug therapy: Secondary | ICD-10-CM

## 2023-01-21 DIAGNOSIS — N390 Urinary tract infection, site not specified: Secondary | ICD-10-CM | POA: Diagnosis present

## 2023-01-21 DIAGNOSIS — F32A Depression, unspecified: Secondary | ICD-10-CM | POA: Diagnosis present

## 2023-01-21 DIAGNOSIS — Z7952 Long term (current) use of systemic steroids: Secondary | ICD-10-CM

## 2023-01-21 DIAGNOSIS — Z7901 Long term (current) use of anticoagulants: Secondary | ICD-10-CM

## 2023-01-21 DIAGNOSIS — Z8042 Family history of malignant neoplasm of prostate: Secondary | ICD-10-CM

## 2023-01-21 DIAGNOSIS — Z8541 Personal history of malignant neoplasm of cervix uteri: Secondary | ICD-10-CM

## 2023-01-21 DIAGNOSIS — I2510 Atherosclerotic heart disease of native coronary artery without angina pectoris: Secondary | ICD-10-CM | POA: Diagnosis present

## 2023-01-21 DIAGNOSIS — I700 Atherosclerosis of aorta: Secondary | ICD-10-CM | POA: Diagnosis present

## 2023-01-21 DIAGNOSIS — Z79890 Hormone replacement therapy (postmenopausal): Secondary | ICD-10-CM

## 2023-01-21 DIAGNOSIS — E874 Mixed disorder of acid-base balance: Secondary | ICD-10-CM | POA: Diagnosis present

## 2023-01-21 DIAGNOSIS — R0682 Tachypnea, not elsewhere classified: Secondary | ICD-10-CM | POA: Diagnosis present

## 2023-01-21 DIAGNOSIS — I10 Essential (primary) hypertension: Secondary | ICD-10-CM | POA: Diagnosis present

## 2023-01-21 DIAGNOSIS — Z6821 Body mass index (BMI) 21.0-21.9, adult: Secondary | ICD-10-CM

## 2023-01-21 DIAGNOSIS — F410 Panic disorder [episodic paroxysmal anxiety] without agoraphobia: Secondary | ICD-10-CM | POA: Diagnosis not present

## 2023-01-21 DIAGNOSIS — J690 Pneumonitis due to inhalation of food and vomit: Secondary | ICD-10-CM | POA: Diagnosis present

## 2023-01-21 DIAGNOSIS — R0602 Shortness of breath: Secondary | ICD-10-CM | POA: Diagnosis not present

## 2023-01-21 DIAGNOSIS — E876 Hypokalemia: Secondary | ICD-10-CM | POA: Diagnosis not present

## 2023-01-21 DIAGNOSIS — M7989 Other specified soft tissue disorders: Secondary | ICD-10-CM

## 2023-01-21 DIAGNOSIS — Z880 Allergy status to penicillin: Secondary | ICD-10-CM

## 2023-01-21 DIAGNOSIS — Z85850 Personal history of malignant neoplasm of thyroid: Secondary | ICD-10-CM

## 2023-01-21 DIAGNOSIS — E1169 Type 2 diabetes mellitus with other specified complication: Secondary | ICD-10-CM | POA: Diagnosis present

## 2023-01-21 DIAGNOSIS — R197 Diarrhea, unspecified: Secondary | ICD-10-CM | POA: Diagnosis present

## 2023-01-21 DIAGNOSIS — E039 Hypothyroidism, unspecified: Secondary | ICD-10-CM | POA: Diagnosis present

## 2023-01-21 DIAGNOSIS — E7800 Pure hypercholesterolemia, unspecified: Secondary | ICD-10-CM | POA: Diagnosis present

## 2023-01-21 DIAGNOSIS — Z9842 Cataract extraction status, left eye: Secondary | ICD-10-CM

## 2023-01-21 DIAGNOSIS — J9601 Acute respiratory failure with hypoxia: Secondary | ICD-10-CM | POA: Diagnosis not present

## 2023-01-21 DIAGNOSIS — K52839 Microscopic colitis, unspecified: Secondary | ICD-10-CM | POA: Diagnosis present

## 2023-01-21 DIAGNOSIS — Z1152 Encounter for screening for COVID-19: Secondary | ICD-10-CM

## 2023-01-21 DIAGNOSIS — G4733 Obstructive sleep apnea (adult) (pediatric): Secondary | ICD-10-CM | POA: Diagnosis present

## 2023-01-21 DIAGNOSIS — I5032 Chronic diastolic (congestive) heart failure: Secondary | ICD-10-CM | POA: Diagnosis present

## 2023-01-21 DIAGNOSIS — Z800 Family history of malignant neoplasm of digestive organs: Secondary | ICD-10-CM

## 2023-01-21 DIAGNOSIS — E119 Type 2 diabetes mellitus without complications: Secondary | ICD-10-CM

## 2023-01-21 DIAGNOSIS — Z8249 Family history of ischemic heart disease and other diseases of the circulatory system: Secondary | ICD-10-CM

## 2023-01-21 DIAGNOSIS — R0902 Hypoxemia: Secondary | ICD-10-CM

## 2023-01-21 DIAGNOSIS — F418 Other specified anxiety disorders: Secondary | ICD-10-CM | POA: Diagnosis present

## 2023-01-21 DIAGNOSIS — N289 Disorder of kidney and ureter, unspecified: Secondary | ICD-10-CM | POA: Diagnosis present

## 2023-01-21 DIAGNOSIS — I110 Hypertensive heart disease with heart failure: Secondary | ICD-10-CM | POA: Diagnosis present

## 2023-01-21 DIAGNOSIS — Z961 Presence of intraocular lens: Secondary | ICD-10-CM | POA: Diagnosis present

## 2023-01-21 DIAGNOSIS — Z853 Personal history of malignant neoplasm of breast: Secondary | ICD-10-CM

## 2023-01-21 DIAGNOSIS — E890 Postprocedural hypothyroidism: Secondary | ICD-10-CM | POA: Diagnosis present

## 2023-01-21 DIAGNOSIS — I3481 Nonrheumatic mitral (valve) annulus calcification: Secondary | ICD-10-CM | POA: Diagnosis present

## 2023-01-21 DIAGNOSIS — E1142 Type 2 diabetes mellitus with diabetic polyneuropathy: Secondary | ICD-10-CM | POA: Diagnosis present

## 2023-01-21 DIAGNOSIS — Z9841 Cataract extraction status, right eye: Secondary | ICD-10-CM

## 2023-01-21 DIAGNOSIS — Z833 Family history of diabetes mellitus: Secondary | ICD-10-CM

## 2023-01-21 DIAGNOSIS — H409 Unspecified glaucoma: Secondary | ICD-10-CM | POA: Diagnosis present

## 2023-01-21 DIAGNOSIS — E440 Moderate protein-calorie malnutrition: Secondary | ICD-10-CM | POA: Insufficient documentation

## 2023-01-21 LAB — CBC WITH DIFFERENTIAL/PLATELET
Abs Immature Granulocytes: 0.16 10*3/uL — ABNORMAL HIGH (ref 0.00–0.07)
Basophils Absolute: 0 10*3/uL (ref 0.0–0.1)
Basophils Relative: 0 %
Eosinophils Absolute: 0 10*3/uL (ref 0.0–0.5)
Eosinophils Relative: 0 %
HCT: 40.8 % (ref 36.0–46.0)
Hemoglobin: 13.3 g/dL (ref 12.0–15.0)
Immature Granulocytes: 1 %
Lymphocytes Relative: 6 %
Lymphs Abs: 1 10*3/uL (ref 0.7–4.0)
MCH: 32.4 pg (ref 26.0–34.0)
MCHC: 32.6 g/dL (ref 30.0–36.0)
MCV: 99.3 fL (ref 80.0–100.0)
Monocytes Absolute: 1.1 10*3/uL — ABNORMAL HIGH (ref 0.1–1.0)
Monocytes Relative: 7 %
Neutro Abs: 14.6 10*3/uL — ABNORMAL HIGH (ref 1.7–7.7)
Neutrophils Relative %: 86 %
Platelets: 196 10*3/uL (ref 150–400)
RBC: 4.11 MIL/uL (ref 3.87–5.11)
RDW: 13.1 % (ref 11.5–15.5)
WBC: 16.9 10*3/uL — ABNORMAL HIGH (ref 4.0–10.5)
nRBC: 0 % (ref 0.0–0.2)

## 2023-01-21 LAB — BASIC METABOLIC PANEL
Anion gap: 10 (ref 5–15)
BUN: 17 mg/dL (ref 8–23)
CO2: 25 mmol/L (ref 22–32)
Calcium: 7.6 mg/dL — ABNORMAL LOW (ref 8.9–10.3)
Chloride: 103 mmol/L (ref 98–111)
Creatinine, Ser: 0.69 mg/dL (ref 0.44–1.00)
GFR, Estimated: >60 mL/min (ref >60)
Glucose, Bld: 212 mg/dL — ABNORMAL HIGH (ref 70–99)
Potassium: 2.9 mmol/L — ABNORMAL LOW (ref 3.5–5.1)
Sodium: 138 mmol/L (ref 135–145)

## 2023-01-21 LAB — RESPIRATORY PANEL BY PCR

## 2023-01-21 LAB — RESP PANEL BY RT-PCR (RSV, FLU A&B, COVID)  RVPGX2
Influenza A by PCR: NEGATIVE
Influenza B by PCR: NEGATIVE
Resp Syncytial Virus by PCR: NEGATIVE
SARS Coronavirus 2 by RT PCR: NEGATIVE

## 2023-01-21 LAB — BLOOD GAS, ARTERIAL
Acid-Base Excess: 10.8 mmol/L — ABNORMAL HIGH (ref 0.0–2.0)
Bicarbonate: 34.1 mmol/L — ABNORMAL HIGH (ref 20.0–28.0)
Drawn by: 29503
FIO2: 3 %
O2 Saturation: 98.1 %
Patient temperature: 37
pCO2 arterial: 39 mmHg (ref 32–48)
pH, Arterial: 7.55 — ABNORMAL HIGH (ref 7.35–7.45)
pO2, Arterial: 91 mmHg (ref 83–108)

## 2023-01-21 LAB — LACTIC ACID, PLASMA
Lactic Acid, Venous: 4.4 mmol/L (ref 0.5–1.9)
Lactic Acid, Venous: 5.8 mmol/L (ref 0.5–1.9)

## 2023-01-21 LAB — TSH: TSH: 0.975 u[IU]/mL (ref 0.350–4.500)

## 2023-01-21 LAB — D-DIMER, QUANTITATIVE: D-Dimer, Quant: 13.17 ug/mL-FEU — ABNORMAL HIGH (ref 0.00–0.50)

## 2023-01-21 LAB — TROPONIN I (HIGH SENSITIVITY)
Troponin I (High Sensitivity): 11 ng/L (ref <18)
Troponin I (High Sensitivity): 11 ng/L (ref <18)

## 2023-01-21 LAB — T4, FREE: Free T4: 1.67 ng/dL — ABNORMAL HIGH (ref 0.61–1.12)

## 2023-01-21 LAB — BRAIN NATRIURETIC PEPTIDE: B Natriuretic Peptide: 292.1 pg/mL — ABNORMAL HIGH (ref 0.0–100.0)

## 2023-01-21 MED ORDER — PANTOPRAZOLE SODIUM 40 MG PO TBEC
40.0000 mg | DELAYED_RELEASE_TABLET | Freq: Every day | ORAL | Status: DC
  Administered 2023-01-22 – 2023-01-24 (×3): 40 mg via ORAL
  Filled 2023-01-21 (×3): qty 1

## 2023-01-21 MED ORDER — ALBUTEROL SULFATE (2.5 MG/3ML) 0.083% IN NEBU: 2.5000 mg | INHALATION_SOLUTION | RESPIRATORY_TRACT | Status: DC | PRN

## 2023-01-21 MED ORDER — LACTATED RINGERS IV BOLUS
250.0000 mL | Freq: Once | INTRAVENOUS | Status: AC
  Administered 2023-01-21: 250 mL via INTRAVENOUS

## 2023-01-21 MED ORDER — POLYVINYL ALCOHOL 1.4 % OP SOLN: 1.0000 [drp] | Freq: Every day | OPHTHALMIC | Status: DC | PRN

## 2023-01-21 MED ORDER — LORAZEPAM 0.5 MG PO TABS
0.5000 mg | ORAL_TABLET | Freq: Every day | ORAL | Status: DC
  Administered 2023-01-21 – 2023-01-23 (×3): 0.5 mg via ORAL
  Filled 2023-01-21 (×3): qty 1

## 2023-01-21 MED ORDER — ONDANSETRON HCL 4 MG/2ML IJ SOLN: 4.0000 mg | Freq: Four times a day (QID) | INTRAMUSCULAR | Status: DC | PRN

## 2023-01-21 MED ORDER — METOPROLOL TARTRATE 25 MG PO TABS
100.0000 mg | ORAL_TABLET | Freq: Two times a day (BID) | ORAL | Status: DC
  Administered 2023-01-21 – 2023-01-24 (×5): 100 mg via ORAL
  Filled 2023-01-21 (×5): qty 4

## 2023-01-21 MED ORDER — POTASSIUM CHLORIDE CRYS ER 20 MEQ PO TBCR: 40.0000 meq | EXTENDED_RELEASE_TABLET | Freq: Once | ORAL | Status: DC

## 2023-01-21 MED ORDER — LEVOTHYROXINE SODIUM 50 MCG PO TABS
150.0000 ug | ORAL_TABLET | Freq: Every day | ORAL | Status: DC
  Administered 2023-01-22: 150 ug via ORAL
  Filled 2023-01-21: qty 1

## 2023-01-21 MED ORDER — SERTRALINE HCL 50 MG PO TABS
50.0000 mg | ORAL_TABLET | Freq: Every day | ORAL | Status: DC
  Administered 2023-01-22 – 2023-01-24 (×3): 50 mg via ORAL
  Filled 2023-01-21 (×3): qty 1

## 2023-01-21 MED ORDER — HYDRALAZINE HCL 20 MG/ML IJ SOLN: 5.0000 mg | INTRAMUSCULAR | Status: DC | PRN

## 2023-01-21 MED ORDER — ACETAMINOPHEN 650 MG RE SUPP: 650.0000 mg | Freq: Four times a day (QID) | RECTAL | Status: DC | PRN

## 2023-01-21 MED ORDER — IOHEXOL 350 MG/ML SOLN
75.0000 mL | Freq: Once | INTRAVENOUS | Status: AC | PRN
  Administered 2023-01-21: 75 mL via INTRAVENOUS

## 2023-01-21 MED ORDER — ACETAMINOPHEN 325 MG PO TABS: 650.0000 mg | ORAL_TABLET | Freq: Four times a day (QID) | ORAL | Status: DC | PRN

## 2023-01-21 MED ORDER — APIXABAN 5 MG PO TABS
5.0000 mg | ORAL_TABLET | Freq: Two times a day (BID) | ORAL | Status: DC
  Administered 2023-01-21 – 2023-01-24 (×6): 5 mg via ORAL
  Filled 2023-01-21 (×6): qty 1

## 2023-01-21 MED ORDER — SODIUM CHLORIDE 0.9% FLUSH
3.0000 mL | Freq: Two times a day (BID) | INTRAVENOUS | Status: DC
  Administered 2023-01-21 – 2023-01-23 (×4): 3 mL via INTRAVENOUS

## 2023-01-21 MED ORDER — POTASSIUM CHLORIDE 10 MEQ/100ML IV SOLN
10.0000 meq | INTRAVENOUS | Status: AC
  Administered 2023-01-21 – 2023-01-22 (×6): 10 meq via INTRAVENOUS
  Filled 2023-01-21 (×3): qty 100

## 2023-01-21 MED ORDER — DILTIAZEM HCL ER COATED BEADS 120 MG PO CP24
360.0000 mg | ORAL_CAPSULE | Freq: Every day | ORAL | Status: DC
  Administered 2023-01-22 – 2023-01-24 (×3): 360 mg via ORAL
  Filled 2023-01-21 (×3): qty 3

## 2023-01-21 MED ORDER — ONDANSETRON HCL 4 MG PO TABS: 4.0000 mg | ORAL_TABLET | Freq: Four times a day (QID) | ORAL | Status: DC | PRN

## 2023-01-21 MED ORDER — PREDNISONE 5 MG PO TABS
30.0000 mg | ORAL_TABLET | Freq: Every day | ORAL | Status: DC
  Administered 2023-01-22 – 2023-01-24 (×3): 30 mg via ORAL
  Filled 2023-01-21 (×3): qty 2

## 2023-01-21 MED ORDER — ROSUVASTATIN CALCIUM 20 MG PO TABS
10.0000 mg | ORAL_TABLET | Freq: Every day | ORAL | Status: DC
  Administered 2023-01-21 – 2023-01-23 (×3): 10 mg via ORAL
  Filled 2023-01-21 (×3): qty 1

## 2023-01-21 NOTE — H&P (Addendum)
History and Physical    Patient: [name] [id number] DOB: 1931-09-27 DOA: 01/21/2023 DOS: the patient was seen and examined on 01/21/2023 PCP: Corliss Blacker, MD  Patient coming from: Home - lives alone; NOK: Daughter, Doree Albee, [phone]   Chief Complaint: SOB  HPI: [name] is a 89 y.o. female with medical history significant of afib on Eliquis; breast cancer; DM;  hypothyroidism; GERD; glaucoma; HTN; HLD; and OSA on CPAP presenting with SOB.  She was last admitted from 2/28-3/5 for colitis, acute on chronic diastolic CHF, and afib.  She was initially treated with IV diuretics and was periodically hypoxic (thought to be from CHF) but diuretics were stopped due to concern that they were causing/contributing to diarrhea.  She underwent colonoscopy on 3/4 without significant findings, biopsies negative for microscopic colitis.  Echo was also unremarkable on 3/1, prior echo in 07/2020 with grade 1 diastolic dysfunction.  She felt good when she left the hospital, is not sure when she started feeling differently.  She started with SOB about 4-5 hours ago.  She has had home O2 "for a few minutes" in the past (?).  Slight wheeze.  No coughing.  Maybe a low fever. No sick contacts.  +nausea, no diarrhea but loose stools. No urinary symptoms.  She is on prednisone - GI thought she might be a bacterial infection.  Her daughter thinks it is Creon-related or maybe it is related to lactose intolerance.  She is doing better, going to finish prednisone and is tapering (3 this AM).  Yesterday, she laid in the bed all day and didn't feel well.  Today, she was panting and had difficulty breathing.  Question about Lasix.  Never a smoker, no h/o lung disease.  She is starting to have some memory issues, had sundowning about a month ago but not during her last hospitalization.  She has never been on home O2.  She does want full resuscitation.      ER Course:  Recently discharged, doesn't look  like she was discharged with diuretics.  This AM, sat in 70s and patient felt SOB.  Here with tachypnea 20-30, RLE edema.  CTPE negative, pleural effusion better.  ?cause for respiratory issue.  Unremarkable work-up.  Hypoxic again while in the ER to 83%.     Review of Systems: As mentioned in the history of present illness. All other systems reviewed and are negative. 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   Cervical cancer ([place])    COLONIC POLYPS, ADENOMATOUS, HX OF 10/13/2010   Qualifier: Diagnosis of  By: Nils Pyle CMA (AAMA), Leisha     Dilated cardiomyopathy ([place]) 08/27/2018   DJD (degenerative joint disease) of knee    EROSIVE ESOPHAGITIS 10/13/2010   Qualifier: Diagnosis of  By: Nils Pyle CMA Deborra Medina), [place] hypertension 10/13/2010   Qualifier: Diagnosis of  By: Nils Pyle CMA (AAMA), Rueben Bash, intermittent 07/09/2012   GERD (gastroesophageal reflux disease)    Glaucoma    Hiatal hernia s/p robotic repair & fundoplication [id number]   Qualifier: Diagnosis of  By: Nils Pyle CMA (AAMA), Mearl Latin     History of  laser assisted in situ keratomileusis 07/25/2012   HYPERCHOLESTEROLEMIA 10/13/2010   Qualifier: Diagnosis of  By: Nils Pyle CMA (AAMA), Leisha     Hypothyroidism, postsurgical    h/o thyroid cancer s/p thyroidectomy   Iron Deficiency Anemia 10/14/2010   Qualifier: Diagnosis of  By: Sharlett Iles MD Cline Cools R    Irritable bowel syndrome 10/13/2010   Qualifier: Diagnosis of  By: Nils Pyle CMA (AAMA), Leisha     Lumbar stenosis with neurogenic claudication 04/20/2016   OSA on CPAP    settings at 10-11    Type II diabetes mellitus ([place])    type II    Past Surgical  History:  Procedure Laterality Date   ABDOMINAL HYSTERECTOMY     "partial"   BIOPSY  01/15/2023   Procedure: BIOPSY;  Surgeon: Irene Shipper, MD;  Location: [facility];  Service: Gastroenterology;;   BREAST BIOPSY Right    BREAST LUMPECTOMY Right    CARDIOVERSION N/A 03/30/2014   Procedure: CARDIOVERSION - BEDSIDE;  Surgeon: [name], MD;  Location: [place];  Service: Cardiovascular;  Laterality: N/A;   CARDIOVERSION N/A 03/31/2014   Procedure: CARDIOVERSION  (BEDSIDE) ;  Surgeon: Lelon Perla, MD;  Location: [place];  Service: Cardiovascular;  Laterality: N/A;   CARDIOVERSION N/A 04/27/2014   Procedure: CARDIOVERSION;  Surgeon: Darlin Coco, MD;  Location: [place];  Service: Cardiovascular;  Laterality: N/A;   CARDIOVERSION N/A 04/13/2015   Procedure: CARDIOVERSION;  Surgeon: Thayer Headings, MD;  Location: [place];  Service: Cardiovascular;  Laterality: N/A;   CARDIOVERSION N/A 01/08/2017   Procedure: CARDIOVERSION;  Surgeon: Sanda Klein, MD;  Location: [facility] END[facility]COPY;  Service: Cardiovascular;  Laterality: N/A;   CARDIOVERSION N/A 09/19/2017   Procedure: CARDIOVERSION;  Surgeon: [name], MD;  Location: [place];  Service: Cardiovascular;  Laterality: N/A;   CARDIOVERSION N/A 10/25/2017   Procedure: CARDIOVERSION;  Surgeon: Nahser, Wonda Cheng, MD;  Location: [place];  Service: Cardiovascular;  Laterality: N/A;   CARDIOVERSION N/A 12/27/2018   Procedure: CARDIOVERSION;  Surgeon: [name], MD;  Location: [place];  Service: Cardiovascular;  Laterality: N/A;   CARDIOVERSION N/A 05/20/2019   Procedure: CARDIOVERSION;  Surgeon: [name], MD;  Location: [place];  Service: Cardiovascular;  Laterality: N/A;   CARDIOVERSION N/A 12/10/2019   Procedure: CARDIOVERSION;  Surgeon: Pixie Casino, MD;  Location: [place];  Service: Cardiovascular;  Laterality: N/A;   CARDIOVERSION N/A 12/31/2019   Procedure: CARDIOVERSION;  Surgeon:  Sanda Klein, MD;  Location: [place];  Service: Cardiovascular;  Laterality: N/A;   CARDIOVERSION N/A 10/21/2020   Procedure: CARDIOVERSION;  Surgeon: [name], MD;  Location: [place];  Service: Cardiovascular;  Laterality: N/A;   CARDIOVERSION N/A 12/03/2020   Procedure: CARDIOVERSION;  Surgeon: Jerline Pain, MD;  Location: [place];  Service: Cardiovascular;  Laterality: N/A;   CARDIOVERSION N/A 07/11/2021   Procedure: CARDIOVERSION;  Surgeon: Pixie Casino, MD;  Location[facility] END[facility]COPY;  Service: Cardiovascular;  Laterality: N/A;   CARPAL TUNNEL RELEASE Right    CATARACT EXTRACTION W/ INTRAOCULAR LENS  IMPLANT, BILATERAL Bilateral    COLONOSCOPY     COLONOSCOPY N/A 01/15/2023   Procedure: COLONOSCOPY;  Surgeon: Irene Shipper, MD;  Location: [facility] ENDOSCOPY;  Service: Gastroenterology;  Laterality: N/A;   EXCISIONAL HEMORRHOIDECTOMY     INSERTION OF MESH N/A 05/25/2017   Procedure: INSERTION OF MESH;  Surgeon: Ralene Ok, MD;  Location: [facility];  Service: General;  Laterality: N/A;   TEE WITHOUT CARDIOVERSION  N/A 03/30/2014   Procedure: TRANSESOPHAGEAL ECHOCARDIOGRAM (TEE);  Surgeon: [name], MD;  Location: [facility] ENDOSCOPY;  Service: Cardiovascular;  Laterality: N/A;   THYROIDECTOMY     TONSILLECTOMY     Social History:  reports that she has never smoked. She has never used smokeless tobacco. She reports that she does not drink alcohol and does not use drugs.  Allergies  Allergen Reactions   Penicillins Itching, Rash and Other (See Comments)    Did it involve swelling of the face/tongue/throat, SOB, or low BP? No Did it involve sudden or severe rash/hives, skin peeling, or any reaction on the inside of your mouth or nose? No Did you need to seek medical attention at a hospital or doctor's office? No When did it last happen?    30+ years   If all above answers are "NO", may proceed with cephalosporin use.     Family History  Problem Relation Age of  Onset   Prostate cancer Father    Diabetes Sister    Heart disease Sister    Heart attack Brother    Heart disease Brother    Diabetes Brother    Diabetes Sister    Diabetes Sister    Pancreatic cancer Sister    Diabetes Brother    Heart disease Brother    Diabetes Brother    Heart disease Brother    Diabetes Brother    Heart disease Brother    Diabetes Brother    Heart disease Brother    Diabetes Brother    Heart disease Brother    Diabetes Brother    Diabetes Child     Prior to Admission medications   Medication Sig Start Date End Date Taking? Authorizing Provider  ACCU-CHEK GUIDE test strip  01/09/20   [provider]  acetaminophen (TYLENOL) 650 MG CR tablet Take 650 mg by mouth in the morning and at bedtime.    [provider]  apixaban (ELIQUIS) 5 MG TABS tablet Take 1 tablet (5 mg total) by mouth 2 (two) times daily. 12/18/22   Sherran Needs, NP  cholecalciferol (VITAMIN D) 25 MCG (1000 UNIT) tablet Take 1,000 Units by mouth daily.    [provider]  diltiazem (CARDIZEM CD) 360 MG 24 hr capsule Take 1 capsule (360 mg total) by mouth daily. 01/17/23   Nita Sells, MD  ferrous sulfate 325 (65 FE) MG tablet Take 325 mg by mouth daily with breakfast.    [provider]  levothyroxine (SYNTHROID) 150 MCG tablet Take 150 mcg by mouth every morning. 12/25/22   [provider]  loperamide (IMODIUM) 2 MG capsule Take 1 capsule (2 mg total) by mouth every 4 (four) hours. 12/27/22   Levin Erp, PA  LORazepam (ATIVAN) 1 MG tablet Take 0.5 mg by mouth at bedtime. Take additional 0.5 mg if needed    [provider]  metoprolol tartrate (LOPRESSOR) 100 MG tablet TAKE 1 TABLET(100 MG) BY MOUTH TWICE DAILY Patient taking differently: Take 100 mg by mouth 2 (two) times daily. 11/30/22   Sherran Needs, NP  pantoprazole (PROTONIX) 40 MG tablet Take 1 tablet (40 mg total) by mouth daily. 01/16/23   Nita Sells, MD   Polyvinyl Alcohol-Povidone PF 1.4-0.6 % SOLN Place 1 drop into both eyes daily as needed (dry eyes).     [provider]  potassium chloride (KLOR-CON) 10 MEQ tablet Take 10 mEq by mouth daily.    [provider]  predniSONE (DELTASONE) 10 MG  tablet Take 1 tablet (10 mg total) by mouth daily. 01/16/23   Nita Sells, MD  predniSONE (DELTASONE) 10 MG tablet Take 4 tablets (40 mg total) by mouth daily with breakfast for 2 days, THEN 3.5 tablets (35 mg total) daily with breakfast for 7 days, THEN 3 tablets (30 mg total) daily with breakfast for 7 days, THEN 2.5 tablets (25 mg total) daily with breakfast for 7 days, THEN 2 tablets (20 mg total) daily with breakfast for 30 days, THEN 1.5 tablets (15 mg total) daily with breakfast for 7 days, THEN 1 tablet (10 mg total) daily with breakfast for 7 days, THEN 1 tablet (10 mg total) daily with breakfast for 7 days, THEN 0.5 tablets (5 mg total) daily with breakfast for 7 days. 01/17/23 04/08/23  Nita Sells, MD  rosuvastatin (CRESTOR) 10 MG tablet Take 10 mg by mouth at bedtime.    [provider]  sertraline (ZOLOFT) 50 MG tablet Take 50 mg by mouth daily.    [provider]  vitamin B-12 (CYANOCOBALAMIN) 500 MCG tablet Take 500 mcg by mouth daily.    [provider]  zinc gluconate 50 MG tablet Take 50 mg by mouth daily.    [provider]    Physical Exam: Vitals:   01/21/23 1030 01/21/23 1338 01/21/23 1430 01/21/23 1530  BP: 111/69 108/61 98/66 102/71  Pulse: 99 88 68 86  Resp: 20 20 (!) 30 (!) 29  Temp: 100.1 F (37.8 C)  99.8 F (37.7 C)   TempSrc: Rectal  Oral   SpO2: 99% 95% 98% 97%  Weight:      Height:       General:  Appears calm and comfortable and is in NAD, panting but comfortable appearing with [place] O2 in place Eyes:  EOMI, normal lids, iris ENT:  grossly normal hearing, lips & tongue, mmm Neck:  no LAD, masses or thyromegaly Cardiovascular:  RRR, no m/r/g. R > L LE  edema.  Respiratory:   CTA bilaterally with no wheezes/rales/rhonchi.  Moderately increased respiratory effort. Abdomen:  soft, NT, ND Skin:  no rash or induration seen on limited exam Musculoskeletal:  grossly normal tone BUE/BLE, good ROM, no bony abnormality Psychiatric:  grossly normal mood and affect, speech fluent and appropriate, AOx3 Neurologic:  CN 2-12 grossly intact, moves all extremities in coordinated fashion   Radiological Exams on Admission: Independently reviewed - see discussion in A/P where applicable  VAS [place] LOWER EXTREMITY VENOUS (DVT) (7a-7p)  Result Date: 01/21/2023  Lower Venous DVT Study Patient Name:  KEONNA FROMAN  Date of Exam:   01/21/2023 Medical Rec #: [id number]         Accession #:    [id number] Date of Birth: [birth year]          Patient Gender: F Patient Age:   52 years Exam Location[facility]  [facility] Procedure:      VAS [place] LOWER EXTREMITY VENOUS (DVT) Referring Phys: HAYLEY NAASZ --------------------------------------------------------------------------------  Indications: Swelling.  Risk Factors: None identified. Limitations: Poor ultrasound/tissue interface. Comparison Study: No prior studies. Performing Technologist: Oliver Hum RVT  Examination Guidelines: A complete evaluation includes B-mode imaging, spectral Doppler, color Doppler, and power Doppler as needed of all accessible portions of each vessel. Bilateral testing is considered an integral part of a complete examination. Limited examinations for reoccurring indications may be performed as noted. The reflux portion of the exam is performed with the patient in reverse Trendelenburg.  +---------+---------------+---------+-----------+----------+--------------+ RIGHT    CompressibilityPhasicitySpontaneityPropertiesThrombus Aging +---------+---------------+---------+-----------+----------+--------------+  CFV      Full           Yes      Yes                                  +---------+---------------+---------+-----------+----------+--------------+ SFJ      Full                                                        +---------+---------------+---------+-----------+----------+--------------+ FV Prox  Full                                                        +---------+---------------+---------+-----------+----------+--------------+ FV Mid   Full                                                        +---------+---------------+---------+-----------+----------+--------------+ FV DistalFull                                                        +---------+---------------+---------+-----------+----------+--------------+ PFV      Full                                                        +---------+---------------+---------+-----------+----------+--------------+ POP      Full           Yes      Yes                                 +---------+---------------+---------+-----------+----------+--------------+ PTV      Full                                                        +---------+---------------+---------+-----------+----------+--------------+ PERO     Full                                                        +---------+---------------+---------+-----------+----------+--------------+   +----+---------------+---------+-----------+----------+--------------+ LEFTCompressibilityPhasicitySpontaneityPropertiesThrombus Aging +----+---------------+---------+-----------+----------+--------------+ CFV Full           Yes      Yes                                 +----+---------------+---------+-----------+----------+--------------+  Summary: RIGHT: - There is no evidence of deep vein thrombosis in the lower extremity.  - No cystic structure found in the popliteal fossa.  LEFT: - No evidence of common femoral vein obstruction.  *See table(s) above for measurements and observations.    Preliminary    CT Angio Chest PE W  and/or Wo Contrast  Result Date: 01/21/2023 CLINICAL DATA:  2 day history of cough and shortness of breath EXAM: CT ANGIOGRAPHY CHEST WITH CONTRAST TECHNIQUE: Multidetector CT imaging of the chest was performed using the standard protocol during bolus administration of intravenous contrast. Multiplanar CT image reconstructions and MIPs were obtained to evaluate the vascular anatomy. RADIATION DOSE REDUCTION: This exam was performed according to the departmental dose-optimization program which includes automated exposure control, adjustment of the mA and/or kV according to patient size and/or use of iterative reconstruction technique. CONTRAST:  22m OMNIPAQUE IOHEXOL 350 MG/ML SOLN COMPARISON:  Chest radiograph dated 01/21/2023, CT abdomen and pelvis dated 01/10/2023, CT chest dated 02/05/2017 FINDINGS: Cardiovascular: The study is high quality for the evaluation of pulmonary embolism. There are no filling defects in the central, lobar, segmental or subsegmental pulmonary artery branches to suggest acute pulmonary embolism. Pulmonary artery measures 3.9 cm. Multichamber cardiomegaly. No significant pericardial fluid/thickening. Coronary artery calcifications and aortic atherosclerosis. Mediastinum/Nodes: Surgically absent thyroid gland. Patulous esophagus with small hiatal hernia. No pathologically enlarged axillary, supraclavicular, mediastinal, or hilar lymph nodes. Lungs/Pleura: The central airways are patent. No focal consolidation. No pneumothorax. Small right pleural effusion. Upper abdomen: Unchanged indeterminate exophytic posterior left upper pole 1.1 cm lesion (4:85). Musculoskeletal: No acute or abnormal lytic or blastic osseous lesions. Review of the MIP images confirms the above findings. IMPRESSION: 1. No acute pulmonary embolism. 2. Small right pleural effusion. 3. Unchanged indeterminate exophytic posterior left upper pole 1.1 cm renal lesion. Recommend nonemergent contrast-enhanced MRI for  characterization after acute symptoms have resolved. 4. Aortic Atherosclerosis (ICD10-I70.0). Coronary artery calcifications. Assessment for potential risk factor modification, dietary therapy or pharmacologic therapy may be warranted, if clinically indicated. Electronically Signed   By: LDarrin NipperM.D.   On: 01/21/2023 14:25   DG Chest 2 View  Result Date: 01/21/2023 CLINICAL DATA:  89year old female with shortness of breath, chest tightness. EXAM: CHEST - 2 VIEW COMPARISON:  Portable chest 12/22/2022 and earlier. FINDINGS: Semi upright AP and lateral views the chest 1203 hours. Regressed/resolved bilateral pleural effusions since last month. Stable cardiomegaly and mediastinal contours. Pulmonary vascularity also decreased. No pneumothorax, pulmonary edema or consolidation. Right axillary surgical clips. No acute osseous abnormality identified. Negative visible bowel gas. IMPRESSION: Chronic cardiomegaly.  No acute cardiopulmonary abnormality. Electronically Signed   By: HGenevie AnnM.D.   On: 01/21/2023 12:14    EKG: Independently reviewed.  Afib with rate 98; nonspecific ST changes with no evidence of acute ischemia   Labs on Admission: I have personally reviewed the available labs and imaging studies at the time of the admission.  Pertinent labs:    K+ 2.9 Glucose  212 BNP 292.1 HS troponin 11, 11 WBC 16.9 D-dimer 13.17 COVID/flu/RSV negative   Assessment and Plan: Principal Problem:   Acute hypoxic respiratory failure (HCC) Active Problems:   Diarrhea   Persistent atrial fibrillation (HCC)   DMII (diabetes mellitus, type 2) (HCC)   Post-surgical hypothyroidism   Hyperlipidemia associated with type 2 diabetes mellitus ([place]   Essential hypertension   Depression with anxiety   Chronic diastolic CHF (congestive heart failure) ([place]    Acute hypoxic respiratory failure  -  Presented today with tachypnea, increased WOB; found to have O2 sats to 83% in the ER -Patient with recent  hospitalization for colitis -During that hospitalization she had recurrent acute hypoxic respiratory failure -This was thought to be related to diastolic CHF and she was diuresed -She was not discharged on Lasix due to concerns that this was exacerbating her diarrhea (no longer thought to be the culprit) -There is no evidence of volume overload clinically or on imaging at this time -Pleural effusions are improved -Markedly elevated D-dimer of uncertain significance, but no DVT/PE on imaging -Will observe in progressive care -Will request pulm consult -O2 supplementation as needed -Will do ambulatory pulse ox tomorrow  Diarrhea -Recent admission with concern for microscopic colitis, but biopsies were negative -She has been on a slow prednisone taper and this appears to have quelled symptoms -She also may have a lactose intolerance (lactose free diet ordered) -Overall, this issue appears to be stable/improved  Renal lesion -Stable 1.1 cm lesion -Will need non-urgent MRI for further characterization  Afib -Rate controlled with metoprolol, diltiazem -Continue Eliquis  Chronic diastolic CHF -Recent echo was unremarkable - possible HOCM with LA dilation but preserved EF and no significant valvular issues (degenerative MV) -She appears clinically compensated at this time and imaging does not indicate volume overload -It is appropriate to resume home prn Lasix but she does not appear to need any currently  Hypothyroidism -s/p thyroidectomy for thyroid cancer -Recent normal TSH -Continue Synthroid  Depression -Continue sertraline -Daughter reports recent concern for MCI -Will order delirium precautions  DM -Recent A1c <7 -No home medications -Does not need a special diet or accuchecks currently, as her A1c goal is <8   Advance Care Planning:   Code Status: Full Code - Code status was discussed with the patient and/or family at the time of admission.  The patient would want to  receive full resuscitative measures at this time.   Consults: Pulmonology; TOC team; PT/OT; nutrition  DVT Prophylaxis: Eliquis  Family Communication: None present; I spoke with her daughter by telephone at the time of admission  Severity of Illness: The appropriate patient status for this patient is OBSERVATION. Observation status is judged to be reasonable and necessary in order to provide the required intensity of service to ensure the patient's safety. The patient's presenting symptoms, physical exam findings, and initial radiographic and laboratory data in the context of their medical condition is felt to place them at decreased risk for further clinical deterioration. Furthermore, it is anticipated that the patient will be medically stable for discharge from the hospital within 2 midnights of admission.   Author: Karmen Bongo, MD 01/21/2023 4:26 PM  For on call review [email or web address].

## 2023-01-21 NOTE — ED Triage Notes (Signed)
Pt BIB [facility] from home C/O SOB X 2 days. Recently discharged after hospitalization for CHF.

## 2023-01-21 NOTE — ED Notes (Signed)
Pt back from CT scan. Noted to have increased WOB and accessory muscle use on reevaluation. SaO2 dropped to 83% RA. Placed on 3L [place]. SaO2 improved to 97% on 3L [place]. MD notified of change in patient status. Daughter at bedside.

## 2023-01-21 NOTE — ED Notes (Signed)
Called lab to add Troponin

## 2023-01-21 NOTE — Consult Note (Addendum)
NAME:  [name], MRN:  [id number], DOB:  Mar 14, 1931, LOS: 0 ADMISSION DATE:  01/21/2023, CONSULTATION DATE:  01/21/23 REFERRING MD:  yates, CHIEF COMPLAINT:  dyspnea   History of Present Illness:  89yF with history AF on eliquis, breast ca, hypothyroid, GERD, OSA on CPAP who is admitted for workup of dyspnea. She was admitted 2/28-/3/5 for colitis, acute on chronic diastolic heart failure and AF. She was diuresed until there was concern diuretic was contributing to her diarrhea? C-Scope 3/4 without explanation of her diarrhea. She was however discharged with prolonged steroid taper for possible microscopic colitis (though biopsies negative), O2 1L. GI had planned to reach out to have her discontinue steroid taper.   Still has had loose stools since being home. She is finishing a prednisone taper. Today rapidly worsening dyspnea, found to have saturation in 70s. Tachypneic.   Workup in ED remarkable for CTA Chest without PE. Probably a little bit of extravascular volume overload but less so than on presentation 2/28 relative to CT A/P lung bases with larger effusion at that time and her weight is actually down relative to last admission (59.9 kg charted now).   Pertinent  Medical History  AF on eliquis Breast Ca Hypothyroid Thyroid cancer s/p resection OSA on CPAP Diarrhea  Significant Hospital Events: Including procedures, antibiotic start and stop dates in addition to other pertinent events   2/28-/3/5 admitted for colitis, acute on chronic diastolic heart failure and AF and maintained on prolonged steroid taper  Interim History / Subjective:    Objective   Blood pressure 102/71, pulse 86, temperature 99.8 F (37.7 C), temperature source Oral, resp. rate (!) 29, height '5\' 5"'$  (1.651 m), weight 59.9 kg, SpO2 97 %.       No intake or output data in the 24 hours ending 01/21/23 1609 Filed Weights   01/21/23 1028  Weight: 59.9 kg    Examination: General appearance: 89 y.o.,  female, frail,  female, frail Eyes: PERRL, tracking appropriately HENT: NCAT; dry MM, extremely hard of hearing Neck: Trachea midline; no lymphadenopathy, no JVD Lungs: diminished bl, tachypneic with mildly increased wob CV: tachy IRIR, no murmur  Abdomen: Soft, non-tender; non-distended, BS present  Extremities: No peripheral edema, warm Neuro: grossly nonfocal   CTA Chest 01/21/23 with very small R pleural effusion, huge heart, patulous esophagus and nothing parenchymally that would explain her hypoxia or dyspnea, patulous esophagus  TTE 01/12/23 indeterminate for DD. Mild/mod TR  Resolved Hospital Problem list    Assessment & Plan:   # Dyspnea, tachypnea # Hypoxia - new 2-3L O2 requirement during this hospitalization and last one # History of OSA on CPAP - though caregiver reports doesn't use CPAP # Chronic diastolic heart failure  Borderline febrile here in this 89 yo pt. Tachypnea could be related to developing early sepsis, unclear source. While she still has a little extravascular volume overload (small effusion), her weight is down relative to last admission (59 kg now), BNP lower, R pleural effusion much smaller on CTA, IVC is surprisingly collapsible on bedside TTE. No evidence of aspiration despite patulous esophagus and no other obvious parenchymal issue that would clearly explain her hypoxia. No clear evidence of even limited amount of mucus plugging. Maybe PH is underestimated by echo.  - check abg, tsh, t4 - UA, BCx, RVP - would consider empiric course of ABX with low threshold to discontinue at 48 hours if infectious workup unrevealing - agree with holding diuretic for now - would rapidly taper off steroids unless  she were to later need stress dose steroids for septic shock - per last GI note, plan was to discontinue and she's been on them for << 2 weeks - PT/mobilize - if still hypoxic after investigating above then consideration of TTE with bubble   Best Practice (right click and  "Reselect all SmartList Selections" daily)   Per primary  Labs   CBC: Recent Labs  Lab 01/15/23 0505 01/21/23 1115  WBC 6.5 16.9*  NEUTROABS  --  14.6*  HGB 11.0* 13.3  HCT 34.5* 40.8  MCV 101.8* 99.3  PLT 218 [id number]    Basic Metabolic Panel: Recent Labs  Lab 01/15/23 0505 01/16/23 0537 01/21/23 1228  NA 139 138 138  K 3.7 3.9 2.9*  CL 103 103 103  CO2 '27 27 25  '$ GLUCOSE 123* 157* 212*  BUN '12 19 17  '$ CREATININE 0.55 0.64 0.69  CALCIUM 8.6* 8.4* 7.6*  MG 2.0  --   --    GFR: Estimated Creatinine Clearance: 42.9 mL/min (by C-G formula based on SCr of 0.69 mg/dL). Recent Labs  Lab 01/15/23 0505 01/21/23 1115  WBC 6.5 16.9*    Liver Function Tests: No results for input(s): "AST", "ALT", "ALKPHOS", "BILITOT", "PROT", "ALBUMIN" in the last 168 hours. No results for input(s): "LIPASE", "AMYLASE" in the last 168 hours. No results for input(s): "AMMONIA" in the last 168 hours.  ABG No results found for: "PHART", "PCO2ART", "PO2ART", "HCO3", "TCO2", "ACIDBASEDEF", "O2SAT"   Coagulation Profile: No results for input(s): "INR", "PROTIME" in the last 168 hours.  Cardiac Enzymes: No results for input(s): "CKTOTAL", "CKMB", "CKMBINDEX", "TROPONINI" in the last 168 hours.  HbA1C: Hgb A1c MFr Bld  Date/Time Value Ref Range Status  12/12/2022 06:32 AM 5.9 (H) 4.8 - 5.6 % Final    Comment:    (NOTE) Pre diabetes:          5.7%-6.4%  Diabetes:              >6.4%  Glycemic control for   <7.0% adults with diabetes   05/22/2017 11:54 AM 6.7 (H) 4.8 - 5.6 % Final    Comment:    (NOTE)         Pre-diabetes: 5.7 - 6.4         Diabetes: >6.4         Glycemic control for adults with diabetes: <7.0     CBG: Recent Labs  Lab 01/15/23 1135 01/15/23 1738 01/15/23 2112 01/16/23 0733 01/16/23 1140  GLUCAP 140* 246* 249* 153* 174*    Review of Systems:   12 point review of systems limited by how hard of hearing she is  Past Medical History:  She,  has a past  medical history of A-fib ([place]) (2015), 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), Anxiety state (10/13/2010), Barrett's esophagus, Breast cancer ([place]), 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), Cervical cancer ([place]), COLONIC POLYPS, ADENOMATOUS, HX OF (10/13/2010), Dilated cardiomyopathy ([place]) (08/27/2018), DJD (degenerative joint disease) of knee, EROSIVE ESOPHAGITIS (10/13/2010), Essential hypertension (10/13/2010), Exotropia, intermittent (07/09/2012), GERD (gastroesophageal reflux disease), Glaucoma, Hiatal hernia s/p robotic repair & fundoplication [id number] ([id number]), History of laser assisted in situ keratomileusis (07/25/2012), HYPERCHOLESTEROLEMIA (10/13/2010), Hypothyroidism, postsurgical, Iron Deficiency Anemia (10/14/2010), Irritable bowel syndrome (10/13/2010), Lumbar stenosis with neurogenic claudication (04/20/2016), OSA on CPAP, and Type II diabetes mellitus ([place]).   Surgical History:   Past Surgical History:  Procedure Laterality Date   ABDOMINAL HYSTERECTOMY     "partial"   BIOPSY  01/15/2023   Procedure: BIOPSY;  Surgeon: Irene Shipper,  MD;  Location: [facility];  Service: Gastroenterology;;   BREAST BIOPSY Right    BREAST LUMPECTOMY Right    CARDIOVERSION N/A 03/30/2014   Procedure: CARDIOVERSION - BEDSIDE;  Surgeon: [name], MD;  Location: [place];  Service: Cardiovascular;  Laterality: N/A;   CARDIOVERSION N/A 03/31/2014   Procedure: CARDIOVERSION  ([id number]) ;  Surgeon: Lelon Perla, MD;  Location: [place];  Service: Cardiovascular;  Laterality: N/A;   CARDIOVERSION N/A 04/27/2014   Procedure: CARDIOVERSION;  Surgeon: Darlin Coco, MD;  Location: [place];  Service: Cardiovascular;  Laterality: N/A;   CARDIOVERSION N/A 04/13/2015   Procedure: CARDIOVERSION;  Surgeon: Thayer Headings, MD;  Location: [place];  Service: Cardiovascular;  Laterality: N/A;   CARDIOVERSION N/A 01/08/2017   Procedure: CARDIOVERSION;  Surgeon: Sanda Klein, MD;  Location:  [facility] ENDOSCOPY;  Service: Cardiovascular;  Laterality: N/A;   CARDIOVERSION N/A 09/19/2017   Procedure: CARDIOVERSION;  Surgeon: [name], MD;  Location: [place];  Service: Cardiovascular;  Laterality: N/A;   CARDIOVERSION N/A 10/25/2017   Procedure: CARDIOVERSION;  Surgeon: Nahser, Wonda Cheng, MD;  Location: [place];  Service: Cardiovascular;  Laterality: N/A;   CARDIOVERSION N/A 12/27/2018   Procedure: CARDIOVERSION;  Surgeon: [name], MD;  Location: [place];  Service: Cardiovascular;  Laterality: N/A;   CARDIOVERSION N/A 05/20/2019   Procedure: CARDIOVERSION;  Surgeon: [name], MD;  Location: [place];  Service: Cardiovascular;  Laterality: N/A;   CARDIOVERSION N/A 12/10/2019   Procedure: CARDIOVERSION;  Surgeon: Pixie Casino, MD;  Location: [place];  Service: Cardiovascular;  Laterality: N/A;   CARDIOVERSION N/A 12/31/2019   Procedure: CARDIOVERSION;  Surgeon: Sanda Klein, MD;  Location: [place];  Service: Cardiovascular;  Laterality: N/A;   CARDIOVERSION N/A 10/21/2020   Procedure: CARDIOVERSION;  Surgeon: [name], MD;  Location: [place];  Service: Cardiovascular;  Laterality: N/A;   CARDIOVERSION N/A 12/03/2020   Procedure: CARDIOVERSION;  Surgeon: Jerline Pain, MD;  Location: [place];  Service: Cardiovascular;  Laterality: N/A;   CARDIOVERSION N/A 07/11/2021   Procedure: CARDIOVERSION;  Surgeon: Pixie Casino, MD;  Location[facility] ENDOSCOPY;  Service: Cardiovascular;  Laterality: N/A;   CARPAL TUNNEL RELEASE Right    CATARACT EXTRACTION W/ INTRAOCULAR LENS  IMPLANT, BILATERAL Bilateral    COLONOSCOPY     COLONOSCOPY N/A 01/15/2023   Procedure: COLONOSCOPY;  Surgeon: Irene Shipper, MD;  Location: [facility];  Service: Gastroenterology;  Laterality: N/A;   EXCISIONAL HEMORRHOIDECTOMY     INSERTION OF MESH N/A 05/25/2017   Procedure: INSERTION OF MESH;  Surgeon: Ralene Ok, MD;  Location: [facility];  Service:  General;  Laterality: N/A;   TEE WITHOUT CARDIOVERSION N/A 03/30/2014   Procedure: TRANSESOPHAGEAL ECHOCARDIOGRAM (TEE);  Surgeon: [name], MD;  Location: [facility] ENDOSCOPY;  Service: Cardiovascular;  Laterality: N/A;   THYROIDECTOMY     TONSILLECTOMY       Social History:   reports that she has never smoked. She has never used smokeless tobacco. She reports that she does not drink alcohol and does not use drugs.   Family History:  Her family history includes Diabetes in her brother, brother, brother, brother, brother, brother, brother, child, sister, sister, and sister; Heart attack in her brother; Heart disease in her brother, brother, brother, brother, brother, brother, and sister; Pancreatic cancer in her sister; Prostate cancer in her father.   Allergies Allergies  Allergen Reactions   Penicillins Itching, Rash and Other (See Comments)    Did  it involve swelling of the face/tongue/throat, SOB, or low BP? No Did it involve sudden or severe rash/hives, skin peeling, or any reaction on the inside of your mouth or nose? No Did you need to seek medical attention at a hospital or doctor's office? No When did it last happen?    30+ years   If all above answers are "NO", may proceed with cephalosporin use.      Home Medications  Prior to Admission medications   Medication Sig Start Date End Date Taking? Authorizing Provider  apixaban (ELIQUIS) 5 MG TABS tablet Take 1 tablet (5 mg total) by mouth 2 (two) times daily. 12/18/22  Yes Sherran Needs, NP  ACCU-CHEK GUIDE test strip  01/09/20   [provider]  acetaminophen (TYLENOL) 650 MG CR tablet Take 650 mg by mouth in the morning and at bedtime.    [provider]  cholecalciferol (VITAMIN D) 25 MCG (1000 UNIT) tablet Take 1,000 Units by mouth daily.    [provider]  diltiazem (CARDIZEM CD) 360 MG 24 hr capsule Take 1 capsule (360 mg total) by mouth daily. 01/17/23   Nita Sells, MD  ferrous sulfate  325 (65 FE) MG tablet Take 325 mg by mouth daily with breakfast.    [provider]  levothyroxine (SYNTHROID) 150 MCG tablet Take 150 mcg by mouth every morning. 12/25/22   [provider]  loperamide (IMODIUM) 2 MG capsule Take 1 capsule (2 mg total) by mouth every 4 (four) hours. 12/27/22   Levin Erp, PA  LORazepam (ATIVAN) 1 MG tablet Take 0.5 mg by mouth at bedtime. Take additional 0.5 mg if needed    [provider]  metoprolol tartrate (LOPRESSOR) 100 MG tablet TAKE 1 TABLET(100 MG) BY MOUTH TWICE DAILY Patient taking differently: Take 100 mg by mouth 2 (two) times daily. 11/30/22   Sherran Needs, NP  pantoprazole (PROTONIX) 40 MG tablet Take 1 tablet (40 mg total) by mouth daily. 01/16/23   Nita Sells, MD  Polyvinyl Alcohol-Povidone PF 1.4-0.6 % SOLN Place 1 drop into both eyes daily as needed (dry eyes).     [provider]  potassium chloride (KLOR-CON) 10 MEQ tablet Take 10 mEq by mouth daily.    [provider]  predniSONE (DELTASONE) 10 MG tablet Take 1 tablet (10 mg total) by mouth daily. 01/16/23   Nita Sells, MD  predniSONE (DELTASONE) 10 MG tablet Take 4 tablets (40 mg total) by mouth daily with breakfast for 2 days, THEN 3.5 tablets (35 mg total) daily with breakfast for 7 days, THEN 3 tablets (30 mg total) daily with breakfast for 7 days, THEN 2.5 tablets (25 mg total) daily with breakfast for 7 days, THEN 2 tablets (20 mg total) daily with breakfast for 30 days, THEN 1.5 tablets (15 mg total) daily with breakfast for 7 days, THEN 1 tablet (10 mg total) daily with breakfast for 7 days, THEN 1 tablet (10 mg total) daily with breakfast for 7 days, THEN 0.5 tablets (5 mg total) daily with breakfast for 7 days. 01/17/23 04/08/23  Nita Sells, MD  rosuvastatin (CRESTOR) 10 MG tablet Take 10 mg by mouth at bedtime.    [provider]  sertraline (ZOLOFT) 50 MG tablet Take 50 mg by mouth daily.     [provider]  vitamin B-12 (CYANOCOBALAMIN) 500 MCG tablet Take 500 mcg by mouth daily.    [provider]  zinc gluconate 50 MG tablet Take 50 mg by  mouth daily.    [provider]     Critical care time: na

## 2023-01-21 NOTE — Progress Notes (Signed)
RT note: Pt. no longer uses CPAP, currently on 2 lpm n/c.

## 2023-01-21 NOTE — Progress Notes (Signed)
Right lower extremity venous duplex has been completed. Preliminary results can be found in CV Proc through chart review.  Results were given to Dr. Mayra Neer.   01/21/23 3:36 PM Carlos Levering RVT

## 2023-01-21 NOTE — Plan of Care (Signed)
  Problem: Clinical Measurements: Goal: Ability to maintain clinical measurements within normal limits will improve Outcome: Progressing   Problem: Safety: Goal: Ability to remain free from injury will improve Outcome: Progressing   

## 2023-01-21 NOTE — ED Provider Notes (Signed)
[place] EMERGENCY DEPARTMENT AT [facility] Provider Note   CSN: [id number] Arrival date & time: 01/21/23  1021     History {Add pertinent medical, surgical, social history, OB history to HPI:1} Chief Complaint  Patient presents with  . Shortness of Breath    [name] is a 89 y.o. female with thyroid cancer status post resection with postsurgical hypothyroidism, persistent A-fib on Eliquis, T2DM, HLD, HTN, migraines, GERD, IBS, OA, hiatal hernia s/p fundoplication [id number], history of right-sided pleural effusion, presents with SOB.   Daughter at bedside provides additional history.  Patient and daughter state that this morning patient woke up and reported that she felt like she could not breathe.  She is breathing very rapidly was having a difficult time breathing.  Endorsed some pain in her chest with breathing but denies chest pain now.  Daughter states that she was recently hospitalized for fluid on the lungs and received fluid pills while she was there. Per chart review patient was admitted from 01/10/2023 to 01/16/2023 for acute on chronic heart failure, colitis, A-fib with RVR.  Was not prescribed Lasix on discharge per cardiology.  Takes Eliquis and has not missed any doses.  Daughter denies any significant lower extremity edema.  Patient denies cough, fever/chills, orthopnea.  States she is very thirsty and her mouth is very dry.    Shortness of Breath      Home Medications Prior to Admission medications   Medication Sig Start Date End Date Taking? Authorizing Provider  ACCU-CHEK GUIDE test strip  01/09/20   [provider]  acetaminophen (TYLENOL) 650 MG CR tablet Take 650 mg by mouth in the morning and at bedtime.    [provider]  apixaban (ELIQUIS) 5 MG TABS tablet Take 1 tablet (5 mg total) by mouth 2 (two) times daily. 12/18/22   Sherran Needs, NP  cholecalciferol (VITAMIN D) 25 MCG (1000 UNIT) tablet Take 1,000 Units by mouth daily.     [provider]  diltiazem (CARDIZEM CD) 360 MG 24 hr capsule Take 1 capsule (360 mg total) by mouth daily. 01/17/23   Nita Sells, MD  ferrous sulfate 325 (65 FE) MG tablet Take 325 mg by mouth daily with breakfast.    [provider]  levothyroxine (SYNTHROID) 150 MCG tablet Take 150 mcg by mouth every morning. 12/25/22   [provider]  loperamide (IMODIUM) 2 MG capsule Take 1 capsule (2 mg total) by mouth every 4 (four) hours. 12/27/22   Levin Erp, PA  LORazepam (ATIVAN) 1 MG tablet Take 0.5 mg by mouth at bedtime. Take additional 0.5 mg if needed    [provider]  metoprolol tartrate (LOPRESSOR) 100 MG tablet TAKE 1 TABLET(100 MG) BY MOUTH TWICE DAILY Patient taking differently: Take 100 mg by mouth 2 (two) times daily. 11/30/22   Sherran Needs, NP  pantoprazole (PROTONIX) 40 MG tablet Take 1 tablet (40 mg total) by mouth daily. 01/16/23   Nita Sells, MD  Polyvinyl Alcohol-Povidone PF 1.4-0.6 % SOLN Place 1 drop into both eyes daily as needed (dry eyes).     [provider]  predniSONE (DELTASONE) 10 MG tablet Take 1 tablet (10 mg total) by mouth daily. 01/16/23   Nita Sells, MD  predniSONE (DELTASONE) 10 MG tablet Take 4 tablets (40 mg total) by mouth daily with breakfast for 2 days, THEN 3.5 tablets (35 mg total) daily with breakfast for 7 days, THEN 3 tablets (30 mg total) daily with breakfast  for 7 days, THEN 2.5 tablets (25 mg total) daily with breakfast for 7 days, THEN 2 tablets (20 mg total) daily with breakfast for 30 days, THEN 1.5 tablets (15 mg total) daily with breakfast for 7 days, THEN 1 tablet (10 mg total) daily with breakfast for 7 days, THEN 1 tablet (10 mg total) daily with breakfast for 7 days, THEN 0.5 tablets (5 mg total) daily with breakfast for 7 days. 01/17/23 04/08/23  Nita Sells, MD  rosuvastatin (CRESTOR) 10 MG tablet Take 10 mg by mouth at bedtime.    [provider]   sertraline (ZOLOFT) 50 MG tablet Take 50 mg by mouth daily.    [provider]  vitamin B-12 (CYANOCOBALAMIN) 500 MCG tablet Take 500 mcg by mouth daily.    [provider]  zinc gluconate 50 MG tablet Take 50 mg by mouth daily.    [provider]      Allergies    Penicillins    Review of Systems   Review of Systems  Respiratory:  Positive for shortness of breath.    Review of systems Negative for f/c.  A 10 point review of systems was performed and is negative unless otherwise reported in HPI.  Physical Exam Updated Vital Signs BP 111/69 (BP Location: Left Arm)   Pulse 99   Temp 100.1 F (37.8 C) (Rectal)   Resp 20   Ht 5\' 5"  (1.651 m)   Wt 59.9 kg   LMP  (LMP Unknown)   SpO2 99%   BMI 21.97 kg/m  Physical Exam General: Normal appearing female, lying in bed.  HEENT: Sclera anicteric, dry mucous membranes, trachea midline.  Cardiology: RRR, no murmurs/rubs/gallops. BL radial and DP pulses equal bilaterally.  Resp: Tachypneic rates in the 20s to the 40s.  No significantly increased work of breathing.  CTAB, no wheezes, rhonchi, crackles.  Abd: Soft, non-tender, non-distended. No rebound tenderness or guarding.  GU: Deferred. MSK: Asymmetric nonpitting swelling of the right lower extremity below the knee compared to the left lower extremity.  No pain to palpation.  No signs of trauma. Extremities without deformity or TTP. No cyanosis or clubbing. Skin: warm, dry. No rashes or lesions. Neuro: A&Ox4, CNs II-XII grossly intact. MAEs. Sensation grossly intact.  Psych: Normal mood and affect.   ED Results / Procedures / Treatments   Labs (all labs ordered are listed, but only abnormal results are displayed) Labs Reviewed - No data to display  EKG None  Radiology No results found.  Procedures Procedures  {Document cardiac monitor, telemetry assessment procedure when appropriate:1}  Medications Ordered in ED Medications - No data to  display  ED Course/ Medical Decision Making/ A&P                          Medical Decision Making Amount and/or Complexity of Data Reviewed Labs: ordered. Decision-making details documented in ED Course. Radiology: ordered. Decision-making details documented in ED Course.  Risk Prescription drug management. Decision regarding hospitalization.    This patient presents to the ED for concern of SOB, this involves an extensive number of treatment options, and is a complaint that carries with it a high risk of complications and morbidity.  I considered the following differential and admission for this acute, potentially life threatening condition.   MDM:    DDX for dyspnea includes but is not limited to:  Consider pulmonary pleural effusion/CHF exacerbation given that patient was recently admitted for this.  She has no signs of volume overload on exam with no crackles on her lungs but will obtain a chest x-ray and BNP to evaluate.  Given patient's asymmetric edema of the right lower extremity that is new for the patient also consider possible pulmonary embolus given recent hospitalization though patient does take Eliquis.  Daughter reports she has not missed any doses.  No cough fever chills to signify pneumonia.  Patient's telemetry and EKG demonstrates rate controlled atrial fibrillation.  She does endorse some mild chest pain and will investigate MI with troponins.   Clinical Course as of 01/29/23 1617  Sun Jan 21, 2023  1201 WBC(!): 16.9 [HN]  1228 DG Chest 2 View FINDINGS: Semi upright AP and lateral views the chest 1203 hours. Regressed/resolved bilateral pleural effusions since last month. Stable cardiomegaly and mediastinal contours. Pulmonary vascularity also decreased. No pneumothorax, pulmonary edema or consolidation. Right axillary surgical clips. No acute osseous abnormality identified. Negative visible bowel gas.  IMPRESSION: Chronic cardiomegaly.  No acute  cardiopulmonary abnormality.   [HN]  1254 B Natriuretic Peptide(!): 292.1 Decreased from prior [HN]  1503 D-Dimer, Quant(!): 13.17 [HN]  1504 CT Angio Chest PE W and/or Wo Contrast 1. No acute pulmonary embolism. 2. Small right pleural effusion. 3. Unchanged indeterminate exophytic posterior left upper pole 1.1 cm renal lesion. Recommend nonemergent contrast-enhanced MRI for characterization after acute symptoms have resolved. 4. Aortic Atherosclerosis (ICD10-I70.0). Coronary artery calcifications. Assessment for potential risk factor modification, dietary therapy or pharmacologic therapy may be warranted, if clinically indicated.   [HN]  [id number] Patient became hypoxic and is requiring 3L [place]. No PE on CT. Decreased pleural effusion, no pulm edema. No PNA. Unclear cause of hypoxia/tachypnea. Resp panel still pending. Will need to be admitted. [HN]  [id number] Neg for DVT.  [HN]  1532 Admitted to hospitalist for tachypnea, hypoxia to 83% on 3L [place], pending 2nd trop and resp panel.  [HN]    Clinical Course User Index [HN] Audley Hose, MD    Labs: I Ordered, and personally interpreted labs.  The pertinent results include: Those listed above  Imaging Studies ordered: I ordered imaging studies including CXR I independently visualized and interpreted imaging. I agree with the radiologist interpretation  Additional history obtained from chart review.    Cardiac Monitoring: .The patient was maintained on a cardiac monitor.  I personally viewed and interpreted the cardiac monitored which showed an underlying rhythm of: Atrial fibrillation  Reevaluation: After the interventions noted above, I reevaluated the patient and found that they have :{resolved/improved/worsened:23923::"improved"}  Social Determinants of Health: . patient lives with her daughter  Disposition:  ***  Co morbidities that complicate the patient evaluation . Past Medical History:  Diagnosis Date  . A-fib ([place])   .  Acute bronchitis 11/15/2017  . ADENOCARCINOMA, BREAST 10/13/2010   Qualifier: Diagnosis of  By: Nils Pyle CMA (AAMA), Mearl Latin    . 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 Allergy    SEASONAL  . Anemia    years ago  . Anticoagulated by anticoagulation treatment - Eliquis 03/30/2014   Started May 2015   . Anxiety state 10/13/2010   Qualifier: Diagnosis of  By: Nils Pyle CMA ([place]), Mearl Latin    . Arthritis    "all my joints" (03/31/2014)  . Barrett's esophagus   . Breast cancer [facility])    s/p right breast lumpectomy  . CARDIAC MURMUR 10/13/2010   Qualifier: Diagnosis of  By: Nils Pyle CMA ([place]), Mearl Latin    . 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  Cervical cancer ([place])   . Chronic cough 01/02/2017   GERD, hiatal hernia [place] amio  . COLONIC POLYPS, ADENOMATOUS, HX OF 10/13/2010   Qualifier: Diagnosis of  By: Nils Pyle CMA (AAMA), Mearl Latin    . Degenerative disorder of eye 07/25/2012  . Diabetes mellitus without complication ([place])   . Dilated cardiomyopathy ([place]) 08/27/2018  . DIVERTICULOSIS, COLON 10/13/2010   Qualifier: Diagnosis of  By: Nils Pyle CMA (AAMA), Mearl Latin    . DJD (degenerative joint disease) of knee   . Dysrhythmia    Afib  . Echocardiogram 07/2020    Echo 9/21: EF 70-75, moderate LVH, GR 1 DD, RVSP 40.5 (mildly elevated), normal RVSF, moderate LAE, mild RAE, mild MR, trivial AI  . EROSIVE ESOPHAGITIS 10/13/2010   Qualifier: Diagnosis of  By: Nils Pyle CMA ([place]), Mearl Latin    . Error, refractive, myopia 07/09/2012  . Essential hypertension 10/13/2010   Qualifier: Diagnosis of  By: Nils Pyle CMA (AAMA), Mearl Latin    . Exotropia, intermittent 07/09/2012  . GERD 10/14/2010   Qualifier: Diagnosis of  By: Sharlett Iles MD Byrd Hesselbach GERD (gastroesophageal reflux disease)   . Glaucoma   . H/O hiatal hernia   . Heart murmur   . Hiatal hernia s/p robotic repair & fundoplication [id number]   Qualifier: Diagnosis of  By: Nils Pyle CMA ([place]), Mearl Latin    . History of laser assisted  in situ keratomileusis 07/25/2012  . Hx of transesophageal echocardiography (TEE) for monitoring    TEE (03/2014): No LAA clot, moderate LAE, core triatriatum type structure in RA with no stenosis, normal EF 55-60%, mild MR  . HYPERCHOLESTEROLEMIA 10/13/2010   Qualifier: Diagnosis of  By: Nils Pyle CMA (AAMA), Mearl Latin    . Hypertension   . Hypothyroidism, postsurgical   . Iron Deficiency Anemia 10/14/2010   Qualifier: Diagnosis of  By: Sharlett Iles MD Byrd Hesselbach   . Irritable bowel syndrome 10/13/2010   Qualifier: Diagnosis of  By: Nils Pyle CMA (AAMA), Mearl Latin    . Low serum potassium   . Lumbar stenosis with neurogenic claudication 04/20/2016  . Malignant neoplasm of breast ([place]) 02/25/2016  . Migraine    "last one was years ago" (03/31/2014)  . MIGRAINE HEADACHE 10/13/2010   Qualifier: Diagnosis of  By: Nils Pyle CMA ([place]), Mearl Latin    . 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 OSA on CPAP    settings at 10-11   . OSTEOARTHRITIS 10/13/2010   Qualifier: Diagnosis of  By: Nils Pyle CMA (AAMA), Mearl Latin    . Persistent atrial fibrillation 03/31/2015   DLCO dropped from 100% in 2015-50% in 01/2017  . Post-surgical hypothyroidism 04/09/2014  . RECTAL FISSURE 10/13/2010   Qualifier: Diagnosis of  By: Nils Pyle CMA ([place]), Mearl Latin    . S/P Nissen fundoplication (without gastrostomy tube) procedure 05/25/2017  . Sleep apnea   . THYROID CANCER 10/13/2010   Qualifier: Diagnosis of  By: Nils Pyle CMA ([place]), Mearl Latin    . Thyroid cancer [facility])    s/p thyroidectomy  . Type II diabetes mellitus ([place])    type II      Medicines No orders of the defined types were placed in this encounter.   I have reviewed the patients home medicines and have made adjustments as needed  Problem List / ED Course: Problem List Items Addressed This Visit   None        {Document critical care time when appropriate:1} {Document review of labs and clinical decision tools ie heart score, Chads2Vasc2 etc:1}   {  Document your independent review of radiology images, and any outside records:1} {Document your discussion with family members, caretakers, and with consultants:1} {Document social determinants of health affecting pt's care:1} {Document your decision making why or why not admission, treatments were needed:1}  This note was created using dictation software, which may contain spelling or grammatical errors.

## 2023-01-21 NOTE — ED Notes (Signed)
US at bedside

## 2023-01-21 NOTE — ED Notes (Signed)
Hospitalist at bedside 

## 2023-01-22 ENCOUNTER — Observation Stay (HOSPITAL_COMMUNITY): Payer: Medicare PPO

## 2023-01-22 DIAGNOSIS — K529 Noninfective gastroenteritis and colitis, unspecified: Secondary | ICD-10-CM | POA: Diagnosis not present

## 2023-01-22 DIAGNOSIS — J9601 Acute respiratory failure with hypoxia: Secondary | ICD-10-CM | POA: Diagnosis not present

## 2023-01-22 LAB — CBC
HCT: 36.7 % (ref 36.0–46.0)
Hemoglobin: 12 g/dL (ref 12.0–15.0)
MCH: 32.4 pg (ref 26.0–34.0)
MCHC: 32.7 g/dL (ref 30.0–36.0)
MCV: 99.2 fL (ref 80.0–100.0)
Platelets: 199 10*3/uL (ref 150–400)
RBC: 3.7 MIL/uL — ABNORMAL LOW (ref 3.87–5.11)
RDW: 13.3 % (ref 11.5–15.5)
WBC: 10.7 10*3/uL — ABNORMAL HIGH (ref 4.0–10.5)
nRBC: 0 % (ref 0.0–0.2)

## 2023-01-22 LAB — URINALYSIS, W/ REFLEX TO CULTURE (INFECTION SUSPECTED)
Bilirubin Urine: NEGATIVE
Glucose, UA: >=500 mg/dL — AB
Hgb urine dipstick: NEGATIVE
Ketones, ur: NEGATIVE mg/dL
Nitrite: POSITIVE — AB
Protein, ur: 30 mg/dL — AB
Specific Gravity, Urine: 1.026 (ref 1.005–1.030)
pH: 5 (ref 5.0–8.0)

## 2023-01-22 LAB — COMPREHENSIVE METABOLIC PANEL
ALT: 42 U/L (ref 0–44)
AST: 20 U/L (ref 15–41)
Albumin: 2.9 g/dL — ABNORMAL LOW (ref 3.5–5.0)
Alkaline Phosphatase: 54 U/L (ref 38–126)
Anion gap: 7 (ref 5–15)
BUN: 21 mg/dL (ref 8–23)
CO2: 29 mmol/L (ref 22–32)
Calcium: 8.1 mg/dL — ABNORMAL LOW (ref 8.9–10.3)
Chloride: 99 mmol/L (ref 98–111)
Creatinine, Ser: 0.57 mg/dL (ref 0.44–1.00)
GFR, Estimated: >60 mL/min (ref >60)
Glucose, Bld: 156 mg/dL — ABNORMAL HIGH (ref 70–99)
Potassium: 3.5 mmol/L (ref 3.5–5.1)
Sodium: 135 mmol/L (ref 135–145)
Total Bilirubin: 1.2 mg/dL (ref 0.3–1.2)
Total Protein: 5.4 g/dL — ABNORMAL LOW (ref 6.5–8.1)

## 2023-01-22 LAB — ECHOCARDIOGRAM LIMITED BUBBLE STUDY
Calc EF: 67.4 %
P 1/2 time: 425 msec
S' Lateral: 2.1 cm
Single Plane A2C EF: 69.3 %
Single Plane A4C EF: 68.3 %

## 2023-01-22 LAB — PROCALCITONIN: Procalcitonin: 0.11 ng/mL

## 2023-01-22 LAB — LACTIC ACID, PLASMA: Lactic Acid, Venous: 1.8 mmol/L (ref 0.5–1.9)

## 2023-01-22 MED ORDER — POTASSIUM CHLORIDE 10 MEQ/100ML IV SOLN
INTRAVENOUS | Status: AC
  Filled 2023-01-22: qty 100

## 2023-01-22 MED ORDER — LEVOTHYROXINE SODIUM 25 MCG PO TABS
125.0000 ug | ORAL_TABLET | Freq: Every day | ORAL | Status: DC
  Administered 2023-01-23 – 2023-01-24 (×2): 125 ug via ORAL
  Filled 2023-01-22 (×2): qty 1

## 2023-01-22 MED ORDER — BANATROL TF EN LIQD
60.0000 mL | Freq: Three times a day (TID) | ENTERAL | Status: DC
  Filled 2023-01-22: qty 60

## 2023-01-22 MED ORDER — ADULT MULTIVITAMIN W/MINERALS CH
1.0000 | ORAL_TABLET | Freq: Every day | ORAL | Status: DC
  Administered 2023-01-22 – 2023-01-24 (×3): 1 via ORAL
  Filled 2023-01-22 (×3): qty 1

## 2023-01-22 MED ORDER — SODIUM CHLORIDE 0.9 % IV SOLN
2.0000 g | INTRAVENOUS | Status: DC
  Administered 2023-01-22 – 2023-01-24 (×3): 2 g via INTRAVENOUS
  Filled 2023-01-22 (×3): qty 20

## 2023-01-22 MED ORDER — KATE FARMS STANDARD 1.4 PO LIQD
325.0000 mL | Freq: Two times a day (BID) | ORAL | Status: DC
  Administered 2023-01-22 – 2023-01-24 (×3): 325 mL via ORAL
  Filled 2023-01-22 (×5): qty 325

## 2023-01-22 MED ORDER — LACTASE 3000 UNITS PO TABS
6000.0000 [IU] | ORAL_TABLET | Freq: Three times a day (TID) | ORAL | Status: DC
  Administered 2023-01-22 – 2023-01-24 (×7): 6000 [IU] via ORAL
  Filled 2023-01-22 (×8): qty 2

## 2023-01-22 MED ORDER — SACCHAROMYCES BOULARDII 250 MG PO CAPS
250.0000 mg | ORAL_CAPSULE | Freq: Two times a day (BID) | ORAL | Status: DC
  Administered 2023-01-22 – 2023-01-24 (×5): 250 mg via ORAL
  Filled 2023-01-22 (×5): qty 1

## 2023-01-22 NOTE — Progress Notes (Signed)
   NAME:  [name], MRN:  [id number], DOB:  1931-05-05, LOS: 0 ADMISSION DATE:  01/21/2023, CONSULTATION DATE:  01/21/23 REFERRING MD:  yates, CHIEF COMPLAINT:  dyspnea   History of Present Illness:  89yF with history AF on eliquis, breast ca, hypothyroid, GERD, OSA on CPAP who is admitted for workup of dyspnea. She was admitted 2/28-/3/5 for colitis, acute on chronic diastolic heart failure and AF. She was diuresed until there was concern diuretic was contributing to her diarrhea? C-Scope 3/4 without explanation of her diarrhea. She was however discharged with prolonged steroid taper for possible microscopic colitis (though biopsies negative), O2 1L. GI had planned to reach out to have her discontinue steroid taper.   Still has had loose stools since being home. She is finishing a prednisone taper. Today rapidly worsening dyspnea, found to have saturation in 70s. Tachypneic.   Workup in ED remarkable for CTA Chest without PE. Probably a little bit of extravascular volume overload but less so than on presentation 2/28 relative to CT A/P lung bases with larger effusion at that time and her weight is actually down relative to last admission (59.9 kg charted now).   Pertinent  Medical History  AF on eliquis Breast Ca Hypothyroid Thyroid cancer s/p resection OSA on CPAP Diarrhea  Significant Hospital Events: Including procedures, antibiotic start and stop dates in addition to other pertinent events   2/28-/3/5 admitted for colitis, acute on chronic diastolic heart failure and AF and maintained on prolonged steroid taper TTE 01/12/23 indeterminate for DD. Mild/mod TR CTA Chest 01/21/23 with very small R pleural effusion, huge heart, patulous esophagus and nothing parenchymally that would explain her hypoxia or dyspnea, patulous esophagus   Interim History / Subjective:  Feels better  Objective   Blood pressure 102/71, pulse 86, temperature 99.8 F (37.7 C), temperature source Oral, resp.  rate (!) 29, height 5\' 5"  (1.651 m), weight 59.9 kg, SpO2 97 %.       No intake or output data in the 24 hours ending 01/21/23 1609 Filed Weights   01/21/23 1028  Weight: 59.9 kg    Examination:  General 89 year old female resting in chair after ambulating she is in no distress HENT NCAT  Pulm crackles (faint) in bases. No accessory use Card rrr Abd soft Ext warm and dry Neuro intact   Resolved Hospital Problem list    Assessment & Plan:   Dyspnea, tachypnea (resolved)  Resp alkalosis  History of OSA on CPAP - though caregiver reports doesn't use CPAP Chronic diastolic heart failure Small right effusion Chronic diarrhea H/o barretts esophagus  AF (on DOAC) Renal lesion   Discussion Acute onset of dyspnea  PCT negative, RVP negative.  Lactate cleared. Not hypoxic currently. Even tolerated ambulating 80 ft w/out desaturation. I suspect the resp alk and tachypnea which has resolved w/out sig intervention was at least to some point as the pt explained 2/2 anxiety "panic attack". That being said given her patulous esophagus she could certainly still have intermittent aspiration contributing to SOB from time to time  Plan/rec Day 1 CTX->could stop. Don't think she's infected  Wean steroids PT/mobilize Important to have her sit up for at least 30 minutes post meals for reflux precautions     Best Practice (right click and "Reselect all SmartList Selections" daily)   Per primary   Critical care time: na

## 2023-01-22 NOTE — Evaluation (Signed)
Physical Therapy Evaluation Patient Details Name: [name] MRN: [id number] DOB: [birth year] Today's Date: 01/22/2023  History of Present Illness  Pt is 89 yo female admitted 01/21/23 with acute hypoxic resp failure.  Pt with hx including but not limited to afib, breast CA, DM, hypothyroidism, GERD, glaucoma, HTN, HLD, OSA  Clinical Impression  Pt admitted with above diagnosis. At baseline, pt lives alone but does have aides 24 hr/7 days a week.  She was ambulatory with standard walker at home.  Today, pt was able to ambulate 77' with RW and min guard.  She had some shortness of breath (DOE 1/4) but reports much improved and sats maintained 97% or > on RA.  Pt was getting HHPT -recommend continuing HHPT.  Pt currently with functional limitations due to the deficits listed below (see PT Problem List). Pt will benefit from skilled PT to increase their independence and safety with mobility to allow discharge to the venue listed below.          Recommendations for follow up therapy are one component of a multi-disciplinary discharge planning process, led by the attending physician.  Recommendations may be updated based on patient status, additional functional criteria and insurance authorization.  Follow Up Recommendations Home health PT (continue HHPT)      Assistance Recommended at Discharge Intermittent Supervision/Assistance  Patient can return home with the following  Assistance with cooking/housework;Assist for transportation;Help with stairs or ramp for entrance;A little help with walking and/or transfers;A little help with bathing/dressing/bathroom    Equipment Recommendations None recommended by PT  Recommendations for Other Services       Functional Status Assessment Patient has had a recent decline in their functional status and demonstrates the ability to make significant improvements in function in a reasonable and predictable amount of time.     Precautions / Restrictions  Precautions Precautions: Fall      Mobility  Bed Mobility Overal bed mobility: Needs Assistance Bed Mobility: Supine to Sit     Supine to sit: Supervision          Transfers Overall transfer level: Needs assistance Equipment used: Rolling walker (2 wheels) Transfers: Sit to/from Stand, Bed to chair/wheelchair/BSC Sit to Stand: Min guard   Step pivot transfers: Min guard       General transfer comment: To/from BSC with min guard and RW; min guard to stand x 3 during session    Ambulation/Gait Ambulation/Gait assistance: Min guard Gait Distance (Feet): 80 Feet Assistive device: Rolling walker (2 wheels) Gait Pattern/deviations: Step-to pattern, Decreased stride length Gait velocity: decreased     General Gait Details: min cues for RW proximity  Stairs            Wheelchair Mobility    Modified Rankin (Stroke Patients Only)       Balance Overall balance assessment: Needs assistance Sitting-balance support: No upper extremity supported, Feet supported Sitting balance-Leahy Scale: Good     Standing balance support: During functional activity, Reliant on assistive device for balance, Bilateral upper extremity supported Standing balance-Leahy Scale: Poor Standing balance comment: Required RW                             Pertinent Vitals/Pain Pain Assessment Pain Assessment: No/denies pain    Home Living Family/patient expects to be discharged to:: Private residence Living Arrangements: Alone Available Help at Discharge: Personal care attendant;Available 24 hours/day;Friend(s);Family (PCA for 24 hr every day) Type of Home: [place]  Access: Stairs to enter Entrance Stairs-Rails: Left Entrance Stairs-Number of Steps: 3-4   Home Layout: One level Home Equipment: Toilet riser;Tub bench;Cane - single point;Rollator (4 wheels);Standard Walker;BSC/3in1;Wheelchair - manual      Prior Function               Mobility Comments: Pt  ambulates in house with supervision standard walker ADLs Comments: Pt has aide that assists with IADLs (cleaning, occasional assist w/ meals). She can do ADLs w/o assist but aide will help with a sponge bath when she is there too (1x/wk) sits on commode for sponge bath. She has assist for transportation to stores and appointments. She has meals on wheels M-F for lunch. Daughter brings food for microwave and aid helps with food when there.     Hand Dominance   Dominant Hand: Right    Extremity/Trunk Assessment   Upper Extremity Assessment Upper Extremity Assessment: Overall WFL for tasks assessed    Lower Extremity Assessment Lower Extremity Assessment: Overall WFL for tasks assessed    Cervical / Trunk Assessment Cervical / Trunk Assessment: Normal  Communication   Communication: No difficulties  Cognition Arousal/Alertness: Awake/alert Behavior During Therapy: WFL for tasks assessed/performed Overall Cognitive Status: Within Functional Limits for tasks assessed                                 General Comments: pt's caregiver at bedside        General Comments General comments (skin integrity, edema, etc.): Pt on 3 L O2 with sats 100%.  Reports RA at home and orders for amb O2.  Tried RA and sats remained 98% rest and 97% ambulation.  Left on RA and notified RN and critical care in room at end of session.    Exercises     Assessment/Plan    PT Assessment Patient needs continued PT services  PT Problem List Decreased strength;Decreased balance;Decreased mobility;Cardiopulmonary status limiting activity;Decreased activity tolerance       PT Treatment Interventions DME instruction;Functional mobility training;Balance training;Patient/family education;Gait training;Therapeutic activities;Therapeutic exercise;Stair training    PT Goals (Current goals can be found in the Care Plan section)  Acute Rehab PT Goals Patient Stated Goal: to return home PT Goal  Formulation: With patient Time For Goal Achievement: 02/05/23 Potential to Achieve Goals: Good    Frequency Min 3X/week     Co-evaluation               AM-PAC PT "6 Clicks" Mobility  Outcome Measure Help needed turning from your back to your side while in a flat bed without using bedrails?: A Little Help needed moving from lying on your back to sitting on the side of a flat bed without using bedrails?: A Little Help needed moving to and from a bed to a chair (including a wheelchair)?: A Little Help needed standing up from a chair using your arms (e.g., wheelchair or bedside chair)?: A Little Help needed to walk in hospital room?: A Little Help needed climbing 3-5 steps with a railing? : A Little 6 Click Score: 18    End of Session Equipment Utilized During Treatment: Gait belt Activity Tolerance: Patient tolerated treatment well Patient left: in chair;with call bell/phone within reach;with family/visitor present;with chair alarm set Nurse Communication: Mobility status PT Visit Diagnosis: Muscle weakness (generalized) (M62.81);Unsteadiness on feet (R26.81)    Time: [id number] PT Time Calculation (min) (ACUTE ONLY): 28 min   Charges:   PT  Evaluation $PT Eval Low Complexity: 1 Low PT Treatments $Gait Training: 8-22 mins        Abran Richard, PT Acute Rehab [facility] [phone]   Karlton Lemon 01/22/2023, 10:54 AM

## 2023-01-22 NOTE — Evaluation (Signed)
Occupational Therapy Evaluation Patient Details Name: [name] MRN: [id number] DOB: 01-12-1931 Today's Date: 01/22/2023   History of Present Illness Pt is 89 yo female admitted 01/21/23 with acute hypoxic resp failure.  Pt with hx including but not limited to afib, breast CA, DM, hypothyroidism, GERD, glaucoma, HTN, HLD, OSA   Clinical Impression   Patient is currently requiring assistance with ADLs including minimal assist with Lower body ADLs, minimal assist with Upper body ADLs,  as well as  Min guard to minimal assist with functional transfers to toilet.  Current level of function is slightly below patient's typical baseline due to fatigue and SHOB with light standing activity.   During this evaluation, patient was limited by baseline impaired vision, impaired activity tolerance, and impaired dynamic standing balance, all of which has the potential to impact patient's safety and independence during functional mobility, as well as performance for ADLs.  Patient lives alone, with 24/7 private Caregivers.  Patient demonstrates good rehab potential, and should benefit from continued skilled occupational therapy services while in acute care to maximize safety, independence and quality of life at home.  Continued occupational therapy services in the home is recommended.  ?     Recommendations for follow up therapy are one component of a multi-disciplinary discharge planning process, led by the attending physician.  Recommendations may be updated based on patient status, additional functional criteria and insurance authorization.   Follow Up Recommendations  Home health OT     Assistance Recommended at Discharge Frequent or constant Supervision/Assistance  Patient can return home with the following A little help with walking and/or transfers;A little help with bathing/dressing/bathroom;Assist for transportation;Assistance with cooking/housework;Direct supervision/assist for financial  management;Direct supervision/assist for medications management    Functional Status Assessment  Patient has had a recent decline in their functional status and demonstrates the ability to make significant improvements in function in a reasonable and predictable amount of time.  Equipment Recommendations       Recommendations for Other Services       Precautions / Restrictions Precautions Precautions: Fall Precaution Comments: monitor HR Restrictions Weight Bearing Restrictions: No      Mobility Bed Mobility               General bed mobility comments: in recliner    Transfers                          Balance Overall balance assessment: History of Falls, Needs assistance Sitting-balance support: No upper extremity supported, Feet supported Sitting balance-Leahy Scale: Good     Standing balance support: During functional activity, Reliant on assistive device for balance, Bilateral upper extremity supported Standing balance-Leahy Scale: Poor Standing balance comment: Required RW, quick to fatigue                           ADL either performed or assessed with clinical judgement   ADL Overall ADL's : Needs assistance/impaired Eating/Feeding: Modified independent;Sitting   Grooming: Set up;Supervision/safety;Standing;Wash/dry hands   Upper Body Bathing: Minimal assistance;Sitting   Lower Body Bathing: Minimal assistance;Sitting/lateral leans;Min guard   Upper Body Dressing : Min guard;Sitting   Lower Body Dressing: Sitting/lateral leans;Minimal assistance Lower Body Dressing Details (indicate cue type and reason): To doff/don socks with Min assist due to toe nails catching on sock on each foot. Increased time and effort. Toilet Transfer: Agricultural engineer (2 wheels) Toilet Transfer Details (indicate cue type and  reason): Pt stood from Recliner to [place] with Min guard assist.  Pt denied toileting needs and therefore performed marching  in place a few reps and assisted back to recliner once pt became [facility]. Pt reported RPE score of "about 10" for effort of standing task.  SpO2 90% once in chair and climbed to 94% with rest. Room air. Toileting- Clothing Manipulation and Hygiene: Minimal assistance     Tub/Shower Transfer Details (indicate cue type and reason): Pt does not enter tub/shower at home. Functional mobility during ADLs: Min guard;Cueing for safety;Cueing for sequencing;Rolling walker (2 wheels)       Vision Baseline Vision/History: 6 Macular Degeneration;1 Wears glasses Ability to See in Adequate Light: 3 Highly impaired Additional Comments: Aide reports that pt's glasses don't work very well     Environmental education officer      Pertinent Vitals/Pain Pain Assessment Pain Assessment: No/denies pain     Hand Dominance Right   Extremity/Trunk Assessment Upper Extremity Assessment Upper Extremity Assessment: Overall WFL for tasks assessed   Lower Extremity Assessment Lower Extremity Assessment: Overall WFL for tasks assessed   Cervical / Trunk Assessment Cervical / Trunk Assessment: Normal   Communication Communication Communication: HOH   Cognition Arousal/Alertness: Awake/alert Behavior During Therapy: WFL for tasks assessed/performed Overall Cognitive Status: Within Functional Limits for tasks assessed                                 General Comments: Ox4. Delayed processing may be due to [place]  Pt on 3 L O2 with sats 100%.  Reports RA at home and orders for amb O2.  Tried RA and sats remained 98% rest and 97% ambulation.  Left on RA and notified RN and critical care in room at end of session.    Exercises     Shoulder Instructions      Home Living Family/patient expects to be discharged to:: Private residence Living Arrangements: Alone Available Help at Discharge: Personal care attendant;Available 24 hours/day;Friend(s);Family Type of Home: House Home  Access: Stairs to enter [employer] of Steps: 3-4 Entrance Stairs-Rails: Left Home Layout: One level     Bathroom Shower/Tub: Sponge bathes at baseline   Bathroom Toilet:  (BSC atop one toilet and riser with arms on primary toilet.)     Home Equipment: Toilet riser;Tub bench;Cane - single point;Rollator (4 wheels);Standard Walker;BSC/3in1;Wheelchair - manual          Prior Functioning/Environment Prior Level of Function : Needs assist;History of Falls (last six months)             Mobility Comments: Pt ambulates in house with supervision standard walker; recent fall ADLs Comments: Pt has aide that assists with IADLs (cleaning, occasional assist w/ meals). She can do ADLs w/o assist but aide will help with a sponge bath when she is there too (1x/wk) sits on commode for sponge bath. She has assist for transportation to stores and appointments. She has meals on wheels M-F for lunch. Daughter brings food for microwave and aid helps with food when there.  Pt reports recent fall after accepting food tray from meals on wheels and losing balance.        OT Problem List: Decreased activity tolerance;Impaired vision/perception;Impaired balance (sitting and/or standing)      OT Treatment/Interventions: Self-care/ADL training;Therapeutic exercise;Therapeutic activities;Energy conservation;Visual/perceptual remediation/compensation;DME and/or AE instruction;Patient/family education;Balance training    OT Goals(Current goals  can be found in the care plan section) Acute Rehab OT Goals Patient Stated Goal: "Go home" OT Goal Formulation: With patient Time For Goal Achievement: 02/05/23 Potential to Achieve Goals: Good ADL Goals Pt Will Perform Lower Body Dressing: with supervision;with set-up;sitting/lateral leans;sit to/from stand Pt Will Transfer to Toilet: with supervision;ambulating Pt Will Perform Toileting - Clothing Manipulation and hygiene: sitting/lateral leans;sit  to/from stand;with supervision Additional ADL Goal #1: Patient and/or Aide will identify at least 3 energy conservation strategies to employ at home in order to maximize function and quality of life and decrease caregiver burden while preventing exacerbation of symptoms and rehospitalization. Additional ADL Goal #2: Pt will engage in at least 5 min functional standing light activities without loss of standing balance, in order to demonstrate improved activity tolerance and balance needed to perform ADLs safely at home.  RPE no greater than 6/10 and VSS  OT Frequency: Min 2X/week    Co-evaluation              AM-PAC OT "6 Clicks" Daily Activity     Outcome Measure Help from another person eating meals?: None Help from another person taking care of personal grooming?: A Little Help from another person toileting, which includes using toliet, bedpan, or urinal?: A Little Help from another person bathing (including washing, rinsing, drying)?: A Little Help from another person to put on and taking off regular upper body clothing?: A Little Help from another person to put on and taking off regular lower body clothing?: A Little 6 Click Score: 19   End of Session Equipment Utilized During Treatment: Gait belt;Rolling walker (2 wheels) Nurse Communication: Mobility status  Activity Tolerance: Patient limited by fatigue Patient left: in chair;with call bell/phone within reach;with chair alarm set  OT Visit Diagnosis: Unsteadiness on feet (R26.81);History of falling (Z91.81);Low vision, both eyes (H54.2)                Time: 1140-1159 OT Time Calculation (min): 19 min Charges:  OT General Charges $OT Visit: 1 Visit OT Evaluation $OT Eval Low Complexity: 1 Low  Kyri Dai, [place] Office: [phone] 01/22/2023  Julien Girt 01/22/2023, 1:00 PM

## 2023-01-22 NOTE — Progress Notes (Signed)
Echocardiogram 2D Echocardiogram has been performed.  [name] 01/22/2023, 9:54 AM

## 2023-01-22 NOTE — TOC Initial Note (Signed)
Transition of Care [facility]) - Initial/Assessment Note    Patient Details  Name: [name] MRN: [id number] Date of Birth: Mar 20, 1931  Transition of Care [facility]) CM/SW Contact:    Illene Regulus, LCSW Phone Number: 01/22/2023, 11:02 AM  Clinical Narrative:                 Pt is active with [facility] for [place] RN/PT. TOC to follow for d/c needs.  Expected Discharge Plan: [place] Barriers to Discharge: Continued Medical Work up   Patient Goals and CMS Choice Patient states their goals for this hospitalization and ongoing recovery are:: return home          Expected Discharge Plan and Services                                              Prior Living Arrangements/Services     Patient language and need for interpreter reviewed:: Yes Do you feel safe going back to the place where you live?: Yes      Need for Family Participation in Patient Care: Yes (Comment) Care giver support system in place?: Yes (comment) Current home services: Home PT, Home RN Criminal Activity/Legal Involvement Pertinent to Current Situation/Hospitalization: No - Comment as needed  Activities of Daily Living Home Assistive Devices/Equipment: Walker (specify type) ADL Screening (condition at time of admission) Patient's cognitive ability adequate to safely complete daily activities?: Yes Is the patient deaf or have difficulty hearing?: Yes Does the patient have difficulty seeing, even when wearing glasses/contacts?: Yes Does the patient have difficulty concentrating, remembering, or making decisions?: Yes Patient able to express need for assistance with ADLs?: Yes Does the patient have difficulty dressing or bathing?: Yes Independently performs ADLs?: No Communication: Needs assistance Does the patient have difficulty walking or climbing stairs?: Yes Weakness of Legs: Both Weakness of Arms/Hands: None  Permission Sought/Granted                   Emotional Assessment              Admission diagnosis:  Shortness of breath [R06.02] Tachypnea [R06.82] Hypoxia [R09.02] Patient Active Problem List   Diagnosis Date Noted   Acute hypoxic respiratory failure ([place]) 01/21/2023   Chronic diastolic CHF (congestive heart failure) ([place]) 01/21/2023   Tachypnea 01/21/2023   Acute on chronic diastolic heart failure ([place]) 01/13/2023   Chronic diarrhea 01/12/2023   Generalized weakness 01/10/2023   Depression with anxiety 01/10/2023   Pleural effusion on right 01/10/2023   Lesion of left native kidney 01/10/2023   Physical deconditioning 12/13/2022   Hypokalemia 12/13/2022   Diarrhea 12/13/2022   Fall 12/12/2022   Atypical atrial flutter ([place]) 06/13/2021   Secondary hypercoagulable state ([place]) 06/13/2021   A-fib ([place])    Dilated cardiomyopathy ([place]) 08/27/2018   Acute bronchitis 11/15/2017   S/P Nissen fundoplication (without gastrostomy tube) procedure 05/25/2017   Persistent atrial fibrillation ([place])    Chronic cough 01/02/2017   Lumbar stenosis with neurogenic claudication 04/20/2016   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   Malignant neoplasm of breast ([place]) 02/25/2016   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   Atrial fibrillation with RVR ([place]) 03/31/2015   Post-surgical hypothyroidism 04/09/2014   Anticoagulated by anticoagulation treatment - Eliquis 03/30/2014   DMII (diabetes mellitus, type 2) ([place])    Heart murmur    DJD (degenerative  joint disease) of knee    Glaucoma    History of laser assisted in situ keratomileusis 07/25/2012   Degenerative disorder of eye 07/25/2012   Exotropia, intermittent 07/09/2012   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   Error, refractive, myopia 07/09/2012   ANEMIA, IRON DEFICIENCY 10/14/2010   GERD 10/14/2010   THYROID CANCER 10/13/2010   Hyperlipidemia associated with type 2 diabetes mellitus ([place]) 10/13/2010   ANEMIA, UNSPECIFIED 10/13/2010   Anxiety state 10/13/2010    MIGRAINE HEADACHE 10/13/2010   Essential hypertension 10/13/2010   EROSIVE ESOPHAGITIS 10/13/2010   BARRETTS ESOPHAGUS 10/13/2010   Hiatal hernia s/p robotic repair & fundoplication [id number]   Diverticulosis of colon without hemorrhage 10/13/2010   Irritable bowel syndrome 10/13/2010   OSTEOARTHRITIS 10/13/2010   CARDIAC MURMUR 10/13/2010   COLONIC POLYPS, ADENOMATOUS, HX OF 10/13/2010   PCP:  Corliss Blacker, MD Pharmacy:   [id number] [place] [address] Phone: [phone] Fax: [phone]  [facility] [place], [place] [address] Phone: [phone] Fax: [phone]  [id number] [place] [place]Phone: [phone]: [address]Phone: [phone]: [phone]    Social Determinants of Health (SDOH) Social History: SDOH Screenings   Food Insecurity: No Food Insecurity (01/21/2023)  Housing: Low Risk  (01/21/2023)  Transportation Needs: No Transportation Needs (01/21/2023)  Utilities: Not At Risk (01/21/2023)  Tobacco Use: Low Risk  (01/21/2023)   SDOH Interventions:     Readmission Risk Interventions    01/15/2023    8:56 AM  Readmission Risk Prevention Plan  Transportation Screening Complete  PCP or Specialist Appt within 3-5 Days Complete  [place] or [place]  Social Work Consult for [place] Rosa[place]anning/Counseling Complete  Palliative Care Screening Not Applicable  Medication Review (Press photographer Complete

## 2023-01-22 NOTE — Consult Note (Addendum)
Referring Provider: Dr. Niel Hummer  Primary Care Physician:  Corliss Blacker, MD Primary Gastroenterologist:  [name]   Reason for Consultation:  Diarrhea   HPI: [name] is a 89 y.o. female [name] is a 89 y.o. female with a past medical history of anxiety, thyroid cancer s/p thyroidectomy with post surgical hypothyroidism, breast cancer, cervical cancer, hypertension, hyperlipidemia, atrial fibrillation on Eliquis, DM II, peripheral neuropathy, optic ischemic neuropathy, IDA, lumbar decompression L4-L5, chronic diarrhea, GERD, Barrett's esophagus and colon polyps. S/P hiatal hernia repair and Nissen fundoplication [id number].    She was previously admitted to the hospital 01/10/2023 due to having worsening chronic diarrhea.  Laboratory studies 01/01/2023 showed a negative GI pathogen panel including negative C. difficile toxin A/B, negative O&P, positive fecal lactoferrin level and fecal calprotectin 587.  Repeat C. Diff PCR 01/12/2023 was negative. She was started on IV Solumedrol. CTAP 2/28 showed mild wall thickening to the ascending colon concerning for colitis. A colonoscopy was initially deferred as she developed afib with AVR and acute on chronic diastolic CHF.  Her cardiac status stabilized and she subsequently underwent a colonoscopy by Dr. Henrene Pastor 01/15/2023 which showed diverticulosis in the left and right colon and random colon biopsies were negative for microscopic colitis/IBD. She received Solumedrol IV x 5 days the discharged home on Prednisone '40mg'$  po x 2 days with taper instructions to reduce Prednisone by '5mg'$  Q week.   She had difficulty breathing upon awakening 01/21/2023 with associated chest pain therefore she presented to the ED for further evaluation.  Labs in the ED showed a WBC count of 16.9 (On Prednisone).  Hemoglobin 13.3.  Hematocrit 40.8.  Platelet 196.  Potassium 2.9.  BUN 17.  Creatinine 0.69.  Calcium 7.6.  SARS coronavirus 2 negative.   Respiratory panel negative.  Troponin 11.  D-dimer 13.17.  TSH 0.975.  Lactic acid 4.4 -> 5.8 -> today 1.8.  Chest x-ray showed chronic cardiomegaly without residual bilateral pleural effusions as seen per prior chest x-ray.  No acute cardiopulmonary abnormality.  Chest CTA showed a small right pleural effusion without evidence of a PE and an unchanged indeterminate exophytic posterior left upper pole 1.1 cm renal lesion.  A GI consult was requested for further evaluation regarding persistent diarrhea.   I called the patient's daughter, Doree Albee, and she stated her mother's diarrhea "massively improved" since her mother was started on Prednisone. Shirlean Mylar stated her mother was passing 1 to 2 solid stools daily since she was discharged home. However, a few days later her grandson gave the patient a milk shake and she had at least 4 episodes of nonbloody watery diarrhea. Prior Pancreatic elastase level was low at 149 on 03/2022 and she was briefly started on Creon which was subsequently discontinued because her diarrhea worsened after she took it.  However, the patient elected to take 1 Creon on Friday 3/8 and Saturday 3/9 because she thought it might help her diarrhea. She ate yogurt and had explosive diarrhea on 3/9.  She remains on Prednisone 30 mg p.o.   Currently, the patient denies having nausea or vomiting.  No abdominal pain. She had one episode of diarrhea at 4:30 am as reported by her RN. No abdominal pain. No chest pain or shortness of breath.  ECHO in process at the bedside. Her private CNA is at the bedside .  PAST GI PROCEDURES:  Colonoscopy 01/15/2023: -Diverticulosis in the left colon and in the right colon. - Internal hemorrhoids.  -  The examination was otherwise normal on direct and retroflexion views.  - Biopsies were taken with a cold forceps from the entire colon for evaluation of microscopic colitis. A. COLON, RANDOM, BIOPSY:  Colonic mucosa with no significant pathologic changes.  No  microscopic colitis, active inflammation or granulomas.   EGD 06/09/2015: 1) 3 columnar tongues seen at GE junction, all < 1 cm. No nodules/ulcers. Biosies taken.  2) Hiatal hernia 35-40 cm  3) Moderate non-erosive gastritis -No recall EGD due to age - INFLAMED SQUAMOCOLUMNAR MUCOSA WITH INTESTINAL METAPLASIA. - NO DYSPLASIA OR MALIGNANCY IDENTIFIED. - SEE COMMENT.   Colonoscopy 03/04/2008: Diverticulosis to the descending and sigmoid colon.  Nonbleeding.  Severe tics with thickened red haustral folds.   Colonoscopy 01/18/2005 identified one 8 mm adenomatous polyp removed from the cecum and diverticulosis.  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   Cervical cancer ([place])    COLONIC POLYPS, ADENOMATOUS, HX OF 10/13/2010   Qualifier: Diagnosis of  By: Nils Pyle CMA (AAMA), Leisha     Dilated cardiomyopathy ([place]) 08/27/2018   DJD (degenerative joint disease) of knee    EROSIVE ESOPHAGITIS 10/13/2010   Qualifier: Diagnosis of  By: Nils Pyle CMA (AAMA), [place] hypertension 10/13/2010   Qualifier: Diagnosis of  By: Nils Pyle CMA (AAMA), Rueben Bash, intermittent 07/09/2012   GERD (gastroesophageal reflux disease)    Glaucoma    Hiatal hernia s/p robotic repair & fundoplication [id number]   Qualifier: Diagnosis of  By: Nils Pyle CMA (AAMA), Mearl Latin     History of laser assisted in situ keratomileusis 07/25/2012   HYPERCHOLESTEROLEMIA 10/13/2010   Qualifier: Diagnosis of  By: Nils Pyle CMA (AAMA), Leisha     Hypothyroidism, postsurgical    h/o thyroid cancer s/p thyroidectomy   Iron Deficiency Anemia 10/14/2010   Qualifier: Diagnosis of  By: Sharlett Iles MD Cline Cools R    Irritable bowel  syndrome 10/13/2010   Qualifier: Diagnosis of  By: Nils Pyle CMA (AAMA), Leisha     Lumbar stenosis with neurogenic claudication 04/20/2016   OSA on CPAP    settings at 10-11    Type II diabetes mellitus ([place])    type II     Past Surgical History:  Procedure Laterality Date   ABDOMINAL HYSTERECTOMY     "partial"   BIOPSY  01/15/2023   Procedure: BIOPSY;  Surgeon: Irene Shipper, MD;  Location: [facility];  Service: Gastroenterology;;   BREAST BIOPSY Right    BREAST LUMPECTOMY Right    CARDIOVERSION N/A 03/30/2014   Procedure: CARDIOVERSION - BEDSIDE;  Surgeon: [name], MD;  Location: [place];  Service: Cardiovascular;  Laterality: N/A;   CARDIOVERSION N/A 03/31/2014   Procedure: CARDIOVERSION  (BEDSIDE) ;  Surgeon: Lelon Perla, MD;  Location: [place];  Service: Cardiovascular;  Laterality: N/A;   CARDIOVERSION N/A 04/27/2014   Procedure: CARDIOVERSION;  Surgeon: Darlin Coco, MD;  Location: [facility] ENDOSCOPY;  Service: Cardiovascular;  Laterality: N/A;   CARDIOVERSION N/A 04/13/2015   Procedure: CARDIOVERSION;  Surgeon: Thayer Headings, MD;  Location: [place];  Service: Cardiovascular;  Laterality: N/A;   CARDIOVERSION N/A 01/08/2017   Procedure: CARDIOVERSION;  Surgeon:  Sanda Klein, MD;  Location: [place];  Service: Cardiovascular;  Laterality: N/A;   CARDIOVERSION N/A 09/19/2017   Procedure: CARDIOVERSION;  Surgeon: [name], MD;  Location: [place];  Service: Cardiovascular;  Laterality: N/A;   CARDIOVERSION N/A 10/25/2017   Procedure: CARDIOVERSION;  Surgeon: Thayer Headings, MD;  Location: [place];  Service: Cardiovascular;  Laterality: N/A;   CARDIOVERSION N/A 12/27/2018   Procedure: CARDIOVERSION;  Surgeon: [name], MD;  Location: [place];  Service: Cardiovascular;  Laterality: N/A;   CARDIOVERSION N/A 05/20/2019   Procedure: CARDIOVERSION;  Surgeon: [name], MD;  Location: [place];  Service: Cardiovascular;  Laterality:  N/A;   CARDIOVERSION N/A 12/10/2019   Procedure: CARDIOVERSION;  Surgeon: Pixie Casino, MD;  Location: [place];  Service: Cardiovascular;  Laterality: N/A;   CARDIOVERSION N/A 12/31/2019   Procedure: CARDIOVERSION;  Surgeon: Sanda Klein, MD;  Location: [place];  Service: Cardiovascular;  Laterality: N/A;   CARDIOVERSION N/A 10/21/2020   Procedure: CARDIOVERSION;  Surgeon: [name], MD;  Location: [place];  Service: Cardiovascular;  Laterality: N/A;   CARDIOVERSION N/A 12/03/2020   Procedure: CARDIOVERSION;  Surgeon: Jerline Pain, MD;  Location: [place];  Service: Cardiovascular;  Laterality: N/A;   CARDIOVERSION N/A 07/11/2021   Procedure: CARDIOVERSION;  Surgeon: Pixie Casino, MD;  Location: [name] ENDOSCOPY;  Service: Cardiovascular;  Laterality: N/A;   CARPAL TUNNEL RELEASE Right    CATARACT EXTRACTION W/ INTRAOCULAR LENS  IMPLANT, BILATERAL Bilateral    COLONOSCOPY     COLONOSCOPY N/A 01/15/2023   Procedure: COLONOSCOPY;  Surgeon: Irene Shipper, MD;  Location: [facility];  Service: Gastroenterology;  Laterality: N/A;   EXCISIONAL HEMORRHOIDECTOMY     INSERTION OF MESH N/A 05/25/2017   Procedure: INSERTION OF MESH;  Surgeon: Ralene Ok, MD;  Location: [facility];  Service: General;  Laterality: N/A;   TEE WITHOUT CARDIOVERSION N/A 03/30/2014   Procedure: TRANSESOPHAGEAL ECHOCARDIOGRAM (TEE);  Surgeon: [name], MD;  Location: [facility] ENDOSCOPY;  Service: Cardiovascular;  Laterality: N/A;   THYROIDECTOMY     TONSILLECTOMY      Prior to Admission medications   Medication Sig Start Date End Date Taking? Authorizing Provider  acetaminophen (TYLENOL) 650 MG CR tablet Take 650 mg by mouth in the morning and at bedtime.   Yes [provider]  apixaban (ELIQUIS) 5 MG TABS tablet Take 1 tablet (5 mg total) by mouth 2 (two) times daily. 12/18/22  Yes Sherran Needs, NP  cholecalciferol (VITAMIN D) 25 MCG (1000 UNIT) tablet Take 1,000 Units by mouth  daily.   Yes [provider]  diltiazem (CARDIZEM CD) 360 MG 24 hr capsule Take 1 capsule (360 mg total) by mouth daily. 01/17/23  Yes Nita Sells, MD  ferrous sulfate 325 (65 FE) MG tablet Take 325 mg by mouth daily with breakfast.   Yes [provider]  levothyroxine (SYNTHROID) 150 MCG tablet Take 150 mcg by mouth every morning. 12/25/22  Yes [provider]  loperamide (IMODIUM) 2 MG capsule Take 1 capsule (2 mg total) by mouth every 4 (four) hours. 12/27/22  Yes Levin Erp, PA  LORazepam (ATIVAN) 1 MG tablet Take 0.5 mg by mouth at bedtime. Take additional 0.5 mg if needed   Yes [provider]  metoprolol tartrate (LOPRESSOR) 100 MG tablet TAKE 1 TABLET(100 MG) BY MOUTH TWICE DAILY Patient taking differently: Take 100 mg by mouth 2 (two) times daily. 11/30/22  Yes Sherran Needs, NP  pantoprazole (PROTONIX) 40 MG tablet Take 1 tablet (40 mg total) by mouth daily. 01/16/23  Yes Nita Sells, MD  Polyvinyl Alcohol-Povidone PF 1.4-0.6 % SOLN Place 1 drop into both eyes daily as needed (dry eyes).    Yes [provider]  potassium chloride (KLOR-CON) 10 MEQ tablet Take 10 mEq by mouth daily.   Yes [provider]  predniSONE (DELTASONE) 10 MG tablet Take 1 tablet (10 mg total) by mouth daily. 01/16/23  Yes Nita Sells, MD  predniSONE (DELTASONE) 10 MG tablet Take 4 tablets (40 mg total) by mouth daily with breakfast for 2 days, THEN 3.5 tablets (35 mg total) daily with breakfast for 7 days, THEN 3 tablets (30 mg total) daily with breakfast for 7 days, THEN 2.5 tablets (25 mg total) daily with breakfast for 7 days, THEN 2 tablets (20 mg total) daily with breakfast for 30 days, THEN 1.5 tablets (15 mg total) daily with breakfast for 7 days, THEN 1 tablet (10 mg total) daily with breakfast for 7 days, THEN 1 tablet (10 mg total) daily with breakfast for 7 days, THEN 0.5 tablets (5 mg total) daily with breakfast for 7  days. 01/17/23 04/08/23 Yes Nita Sells, MD  rosuvastatin (CRESTOR) 10 MG tablet Take 10 mg by mouth at bedtime.   Yes [provider]  sertraline (ZOLOFT) 50 MG tablet Take 50 mg by mouth daily.   Yes [provider]  vitamin B-12 (CYANOCOBALAMIN) 500 MCG tablet Take 500 mcg by mouth daily.   Yes [provider]  zinc gluconate 50 MG tablet Take 50 mg by mouth daily.   Yes [provider]  ACCU-CHEK GUIDE test strip  01/09/20   [provider]    Current Facility-Administered Medications  Medication Dose Route Frequency Provider Last Rate Last Admin   acetaminophen (TYLENOL) tablet 650 mg  650 mg Oral Q6H PRN Karmen Bongo, MD       Or   acetaminophen (TYLENOL) suppository 650 mg  650 mg Rectal Q6H PRN Karmen Bongo, MD       albuterol (PROVENTIL) (2.5 MG/3ML) 0.083% nebulizer solution 2.5 mg  2.5 mg Nebulization Q2H PRN Karmen Bongo, MD       apixaban Arne Cleveland) tablet 5 mg  5 mg Oral BID Karmen Bongo, MD   5 mg at 01/21/23 2124   cefTRIAXone (ROCEPHIN) 2 g in sodium chloride 0.9 % 100 mL IVPB  2 g Intravenous Q24H Regalado, Belkys A, MD       diltiazem (CARDIZEM CD) 24 hr capsule 360 mg  360 mg Oral Daily Karmen Bongo, MD       hydrALAZINE (APRESOLINE) injection 5 mg  5 mg Intravenous Q4H PRN Karmen Bongo, MD       levothyroxine (SYNTHROID) tablet 150 mcg  150 mcg Oral QAC breakfast Karmen Bongo, MD   150 mcg at 01/22/23 0606   LORazepam (ATIVAN) tablet 0.5 mg  0.5 mg Oral Ivery Quale, MD   0.5 mg at 01/21/23 2125   metoprolol tartrate (LOPRESSOR) tablet 100 mg  100 mg Oral BID Karmen Bongo, MD   100 mg at 01/21/23 2246   ondansetron (ZOFRAN) tablet 4 mg  4 mg Oral Q6H PRN Karmen Bongo, MD       Or   ondansetron [facility]) injection 4 mg  4 mg Intravenous Q6H PRN Karmen Bongo, MD       pantoprazole (PROTONIX) EC tablet 40 mg  40 mg Oral Daily Karmen Bongo, MD  polyvinyl alcohol (LIQUIFILM TEARS) 1.4  % ophthalmic solution 1 drop  1 drop Both Eyes Daily PRN Karmen Bongo, MD       predniSONE (DELTASONE) tablet 30 mg  30 mg Oral Q breakfast Karmen Bongo, MD       rosuvastatin (CRESTOR) tablet 10 mg  10 mg Oral Ivery Quale, MD   10 mg at 01/21/23 2125   sertraline (ZOLOFT) tablet 50 mg  50 mg Oral Daily Karmen Bongo, MD       sodium chloride flush (NS) 0.9 % injection 3 mL  3 mL Intravenous Q12H Karmen Bongo, MD   3 mL at 01/21/23 2248    Allergies as of 01/21/2023 - Review Complete 01/21/2023  Allergen Reaction Noted   Penicillins Itching, Rash, and Other (See Comments)     Family History  Problem Relation Age of Onset   Prostate cancer Father    Diabetes Sister    Heart disease Sister    Heart attack Brother    Heart disease Brother    Diabetes Brother    Diabetes Sister    Diabetes Sister    Pancreatic cancer Sister    Diabetes Brother    Heart disease Brother    Diabetes Brother    Heart disease Brother    Diabetes Brother    Heart disease Brother    Diabetes Brother    Heart disease Brother    Diabetes Brother    Heart disease Brother    Diabetes Brother    Diabetes Child     Social History   Socioeconomic History   Marital status: Divorced    Spouse name: Not on file   Number of children: 2   Years of education: Not on file   Highest education level: Not on file  Occupational History   Occupation: Retired    Comment: Education officer, museum  Tobacco Use   Smoking status: Never   Smokeless tobacco: Never  Scientific laboratory technician Use: Never used  Substance and Sexual Activity   Alcohol use: No    Alcohol/week: 0.0 standard drinks of alcohol   Drug use: No   Sexual activity: Never  Other Topics Concern   Not on file  Social History Narrative   Not on file   Social Determinants of Health   Financial Resource Strain: Not on file  Food Insecurity: No Food Insecurity (01/21/2023)   Hunger Vital Sign    Worried About Running Out of Food in the  Last Year: Never true    Ran Out of Food in the Last Year: Never true  Transportation Needs: No Transportation Needs (01/21/2023)   PRAPARE - Hydrologist (Medical): No    Lack of Transportation (Non-Medical): No  Physical Activity: Not on file  Stress: Not on file  Social Connections: Not on file  Intimate Partner Violence: Not At Risk (01/21/2023)   Humiliation, Afraid, Rape, and Kick questionnaire    Fear of Current or Ex-Partner: No    Emotionally Abused: No    Physically Abused: No    Sexually Abused: No    Review of Systems: Gen: Denies fever, sweats or chills. No weight loss.  CV: See HPI. Resp: Denies cough, shortness of breath of hemoptysis.  GI: Denies heartburn, dysphagia, stomach or lower abdominal pain. No diarrhea or constipation. No rectal bleeding or melena.   GU : Denies urinary burning, blood in urine, increased urinary frequency or incontinence. MS: Denies joint pain, muscles aches or  weakness. Derm: Denies rash, itchiness, skin lesions or unhealing ulcers. Psych: Denies depression. Mild memory loss.  Heme: Denies easy bruising, bleeding. Neuro:  Denies headaches, dizziness or paresthesias. Endo:  + Diabetes and thyroid disease .  Physical Exam: Vital signs in last 24 hours: Temp:  [97.6 F (36.4 C)-100.1 F (37.8 C)] 97.8 F (36.6 C) (03/11 0826) Pulse Rate:  [56-99] 75 (03/11 0826) Resp:  [18-30] 27 (03/11 0826) BP: (98-128)/(61-85) 124/85 (03/11 0826) SpO2:  [95 %-100 %] 96 % (03/11 0826) Weight:  [59.9 kg] 59.9 kg (03/10 1028) Last BM Date : 01/21/23 General: Alert fatigued appearing 89 year old female in no acute distress. Head:  Normocephalic and atraumatic. Eyes:  No scleral icterus. Conjunctiva pink. Ears:  Normal auditory acuity. Nose:  No deformity, discharge or lesions. Mouth:  Dentition intact. No ulcers or lesions.  Neck:  Supple. No lymphadenopathy or thyromegaly.  Lungs: Breath sounds clear, decreased in the  bases.  On oxygen 2 - 3 L nasal cannula. Heart: Irregular rhythm, no murmurs. Abdomen: Soft, nondistended.  Nontender.  Positive bowel sounds to all 4 quadrants. Rectal: Deferred. Musculoskeletal:  Symmetrical without gross deformities.  Pulses:  Normal pulses noted. Extremities: Trace lower extremity edema. Neurologic:  Alert and  oriented x 4. No focal deficits.  Skin:  Intact without significant lesions or rashes. Psych:  Alert and cooperative. Normal mood and affect.  Intake/Output from previous day: 03/10 0701 - 03/11 0700 In: 320 [P.O.:120; IV Piggyback:200] Out: 400 [Urine:400] Intake/Output this shift: No intake/output data recorded.  Lab Results: Recent Labs    01/21/23 1115 01/22/23 0759  WBC 16.9* 10.7*  HGB 13.3 12.0  HCT 40.8 36.7  PLT 196 199   BMET Recent Labs    01/21/23 1228 01/22/23 0759  NA 138 135  K 2.9* 3.5  CL 103 99  CO2 25 29  GLUCOSE 212* 156*  BUN 17 21  CREATININE 0.69 0.57  CALCIUM 7.6* 8.1*   LFT Recent Labs    01/22/23 0759  PROT 5.4*  ALBUMIN 2.9*  AST 20  ALT 42  ALKPHOS 54  BILITOT 1.2   PT/INR No results for input(s): "LABPROT", "INR" in the last 72 hours. Hepatitis Panel No results for input(s): "HEPBSAG", "HCVAB", "HEPAIGM", "HEPBIGM" in the last 72 hours.    Studies/Results: VAS [place] LOWER EXTREMITY VENOUS (DVT) (7a-7p)  Result Date: 01/21/2023  Lower Venous DVT Study Patient Name:  AIYANNA BAMBACH  Date of Exam:   01/21/2023 Medical Rec #: [id number]         Accession #:    [id number] Date of Birth: 07/27/1931          Patient Gender: F Patient Age:   22 years Exam Location:  [facility] Procedure:      VAS [place] LOWER EXTREMITY VENOUS (DVT) Referring Phys: HAYLEY NAASZ --------------------------------------------------------------------------------  Indications: Swelling.  Risk Factors: None identified. Limitations: Poor ultrasound/tissue interface. Comparison Study: No prior studies. Performing Technologist:  Oliver Hum RVT  Examination Guidelines: A complete evaluation includes B-mode imaging, spectral Doppler, color Doppler, and power Doppler as needed of all accessible portions of each vessel. Bilateral testing is considered an integral part of a complete examination. Limited examinations for reoccurring indications may be performed as noted. The reflux portion of the exam is performed with the patient in reverse Trendelenburg.  +---------+---------------+---------+-----------+----------+--------------+ RIGHT    CompressibilityPhasicitySpontaneityPropertiesThrombus Aging +---------+---------------+---------+-----------+----------+--------------+ CFV      Full           Yes  Yes                                 +---------+---------------+---------+-----------+----------+--------------+ SFJ      Full                                                        +---------+---------------+---------+-----------+----------+--------------+ FV Prox  Full                                                        +---------+---------------+---------+-----------+----------+--------------+ FV Mid   Full                                                        +---------+---------------+---------+-----------+----------+--------------+ FV DistalFull                                                        +---------+---------------+---------+-----------+----------+--------------+ PFV      Full                                                        +---------+---------------+---------+-----------+----------+--------------+ POP      Full           Yes      Yes                                 +---------+---------------+---------+-----------+----------+--------------+ PTV      Full                                                        +---------+---------------+---------+-----------+----------+--------------+ PERO     Full                                                         +---------+---------------+---------+-----------+----------+--------------+   +----+---------------+---------+-----------+----------+--------------+ LEFTCompressibilityPhasicitySpontaneityPropertiesThrombus Aging +----+---------------+---------+-----------+----------+--------------+ CFV Full           Yes      Yes                                 +----+---------------+---------+-----------+----------+--------------+     Summary: RIGHT: - There is no evidence of deep vein thrombosis in the lower extremity.  -  No cystic structure found in the popliteal fossa.  LEFT: - No evidence of common femoral vein obstruction.  *See table(s) above for measurements and observations. Electronically signed by Jamelle Haring on 01/21/2023 at 4:34:48 PM.    Final    CT Angio Chest PE W and/or Wo Contrast  Result Date: 01/21/2023 CLINICAL DATA:  2 day history of cough and shortness of breath EXAM: CT ANGIOGRAPHY CHEST WITH CONTRAST TECHNIQUE: Multidetector CT imaging of the chest was performed using the standard protocol during bolus administration of intravenous contrast. Multiplanar CT image reconstructions and MIPs were obtained to evaluate the vascular anatomy. RADIATION DOSE REDUCTION: This exam was performed according to the departmental dose-optimization program which includes automated exposure control, adjustment of the mA and/or kV according to patient size and/or use of iterative reconstruction technique. CONTRAST:  33m OMNIPAQUE IOHEXOL 350 MG/ML SOLN COMPARISON:  Chest radiograph dated 01/21/2023, CT abdomen and pelvis dated 01/10/2023, CT chest dated 02/05/2017 FINDINGS: Cardiovascular: The study is high quality for the evaluation of pulmonary embolism. There are no filling defects in the central, lobar, segmental or subsegmental pulmonary artery branches to suggest acute pulmonary embolism. Pulmonary artery measures 3.9 cm. Multichamber cardiomegaly. No significant pericardial fluid/thickening.  Coronary artery calcifications and aortic atherosclerosis. Mediastinum/Nodes: Surgically absent thyroid gland. Patulous esophagus with small hiatal hernia. No pathologically enlarged axillary, supraclavicular, mediastinal, or hilar lymph nodes. Lungs/Pleura: The central airways are patent. No focal consolidation. No pneumothorax. Small right pleural effusion. Upper abdomen: Unchanged indeterminate exophytic posterior left upper pole 1.1 cm lesion (4:85). Musculoskeletal: No acute or abnormal lytic or blastic osseous lesions. Review of the MIP images confirms the above findings. IMPRESSION: 1. No acute pulmonary embolism. 2. Small right pleural effusion. 3. Unchanged indeterminate exophytic posterior left upper pole 1.1 cm renal lesion. Recommend nonemergent contrast-enhanced MRI for characterization after acute symptoms have resolved. 4. Aortic Atherosclerosis (ICD10-I70.0). Coronary artery calcifications. Assessment for potential risk factor modification, dietary therapy or pharmacologic therapy may be warranted, if clinically indicated. Electronically Signed   By: LDarrin NipperM.D.   On: 01/21/2023 14:25   DG Chest 2 View  Result Date: 01/21/2023 CLINICAL DATA:  89year old female with shortness of breath, chest tightness. EXAM: CHEST - 2 VIEW COMPARISON:  Portable chest 12/22/2022 and earlier. FINDINGS: Semi upright AP and lateral views the chest 1203 hours. Regressed/resolved bilateral pleural effusions since last month. Stable cardiomegaly and mediastinal contours. Pulmonary vascularity also decreased. No pneumothorax, pulmonary edema or consolidation. Right axillary surgical clips. No acute osseous abnormality identified. Negative visible bowel gas. IMPRESSION: Chronic cardiomegaly.  No acute cardiopulmonary abnormality. Electronically Signed   By: HGenevie AnnM.D.   On: 01/21/2023 12:14    IMPRESSION/PLAN:  89year old female readmitted to the hospital with recurrent acute hypoxic respiratory failure. Chest  x-ray showed chronic cardiomegaly without residual bilateral pleural effusions as seen per prior chest x-ray. Chest CTA showed a small right pleural effusion without evidence of a PE.  On empiric Rocephin IV. -Management per the medical service  Chronic diarrhea. Recent negative C. Diff and GI pathogen panel. Positive fecal lactoferrin level and elevated fecal calprotectin 587.Colonoscopy during her prior hospital admission 01/16/2023 was negative for colitis/IBD. She remains on Prednisone taper, currently on Prednisone 30 mg daily. Patient was reportedly passing solid stools for few days following her hospital discharge 3/5 then had recurrence of explosive diarrhea after she drank a milkshake and ate yogurt.  -Recommend Lactaid 1-2 tabs with each dairy product -Continue Prednisone taper as previously prescribed -Florastor probiotic  1 p.o. twice daily -Consider switching PPI to H2 blocker if diarrhea persists -No Creon/pancreatic enzyme  -Await further recommendations per Dr. Loletha Carrow  History of GERD, Barrett's esophagus s/p hiatal hernia repair and Nissen fundoplication [id number].   -Continue Pantoprazole 40 mg daily for now, consider changing to H2 blocker if diarrhea recurs  Atrial fibrillation. CHA2DS2-VASc Score = 5  On Diltiazem, Metoprolol and Eliquis.  Chronic diastolic CHF. ECHO done today, results pending.   Indeterminate exophytic posterior left upper pole 1.1 cm renal lesion.  -Follow-up with PCP as an outpatient   Noralyn Pick  01/22/2023, 10:11 AM  I have taken an interval history, thoroughly reviewed the chart and examined the patient. I agree with the Advanced Practitioner's note, impression and recommendations, and have recorded additional findings, impressions and recommendations below. I performed a substantive portion of this encounter (>50% time spent), including a complete performance of the medical decision making.  My additional thoughts are as  follows:  Extensive chart review performed, patient seen and examined with one of her daughters there.  Additional history was obtained from family.  Delightful 89 year old woman with about a year protracted watery diarrhea that is reportedly much improved after several days of steroids.  She had a recurrence of diarrhea a day or 2 prior to admission that has been attributed to intake of a significant amount of lactose containing food.  It sounds as if this patient most likely has microscopic colitis that was partially treated with steroids prior to the colonoscopy and biopsies, perhaps accounting for the biopsies were normal.  At this point, we no longer suspect she has EPI.  I agree with Dr. Celesta Aver outpatient assessment that the decreased fecal elastase discovered last year was falsely low from dilution due to watery stool of another cause. Therefore, the Creon will be discontinued.  This patient should avoid regular milk cheese is much as possible, as lactase enzymes can help but not necessarily completely eliminate all symptoms of lactose intolerance.  Discussed with her daughter.  If she has recurrence of diarrhea while on steroids and avoiding lactose, then empiric therapy for SIBO should be considered.  I suspect Dr. Carlean Purl has likely consider this already.  No further inpatient testing planned or changes in her current prednisone taper. I will leave it to Dr. Celesta Aver discretion on when he may want to transition this patient from prednisone to budesonide.   Nelida Meuse III Office:[phone]

## 2023-01-22 NOTE — Progress Notes (Signed)
Initial Nutrition Assessment  DOCUMENTATION CODES:   Non-severe (moderate) malnutrition in context of chronic illness  INTERVENTION:   -[employer] 1.4 PO BID, each provides 455 kcals and 20g protein  -Multivitamin with minerals daily  NUTRITION DIAGNOSIS:   Moderate Malnutrition related to chronic illness (diarrhea) as evidenced by percent weight loss, mild fat depletion, mild muscle depletion.  GOAL:   Patient will meet greater than or equal to 90% of their needs  MONITOR:   PO intake, Supplement acceptance, Labs, Weight trends, I & O's  REASON FOR ASSESSMENT:   Consult Assessment of nutrition requirement/status  ASSESSMENT:   89 y.o. female [name] is a 89 y.o. female with a past medical history of anxiety, thyroid cancer s/p thyroidectomy with post surgical hypothyroidism, breast cancer, cervical cancer, hypertension, hyperlipidemia, atrial fibrillation on Eliquis, DM II, peripheral neuropathy, optic ischemic neuropathy, IDA, lumbar decompression L4-L5, chronic diarrhea, GERD, Barrett's esophagus and colon polyps. S/P hiatal hernia repair and Nissen fundoplication [id number]. Readmitted to the hospital with recurrent acute hypoxic respiratory failure.  Patient in room, care taker at bedside. Pt states she feels better today. Has only had 1 BM today so far. Pt reports she has not been eating as well but she still has been eating per care taker. Pt tried to eat yogurt and milkshakes at home and this caused explosive diarrhea. Pt wants to avoid all dairy foods and supplements. Lactaid was ordered per GI.  Pt did eat 100% of her breakfast, oatmeal with peaches.  Pt takes multiple vitamin supplements at home, Vitamin D, iron, zinc and Vitamin B-12.  Patient denies any issues with swallowing or chewing.  Patient agreeable to receiving [name], plant based protein shake for additional kcals and protein. RD ordered Banatrol to aid diarrhea, but ultimately d/c order as  supplement contains milk and lactose, discussed with RN.  Per patient she has lost 70 lbs over a few months. UBW ~211 lbs. Per weight records, pt has lost 31 lbs since 02/22/22 (19% wt loss x 11 months, significant for time frame).  Medications: Lactaid, Florastor, KCl  Labs reviewed.  NUTRITION - FOCUSED PHYSICAL EXAM:  Flowsheet Row Most Recent Value  Orbital Region No depletion  Upper Arm Region Moderate depletion  Thoracic and Lumbar Region No depletion  Buccal Region Mild depletion  Temple Region No depletion  Clavicle Bone Region No depletion  Clavicle and Acromion Bone Region No depletion  Scapular Bone Region No depletion  Dorsal Hand Mild depletion  Patellar Region Mild depletion  Anterior Thigh Region Mild depletion  Posterior Calf Region No depletion  Edema (RD Assessment) None  Hair Reviewed  [thin]  Eyes Reviewed  [reports vision changes]  Mouth Reviewed  Skin Reviewed  Nails Reviewed       Diet Order:   Diet Order             Diet regular Room service appropriate? Yes; Fluid consistency: Thin  Diet effective now                   EDUCATION NEEDS:   No education needs have been identified at this time  Skin:  Skin Assessment: Reviewed RN Assessment  Last BM:  3/11  Height:   Ht Readings from Last 1 Encounters:  01/21/23 '5\' 5"'$  (1.651 m)    Weight:   Wt Readings from Last 1 Encounters:  01/21/23 59.9 kg    BMI:  Body mass index is 21.97 kg/m.  Estimated Nutritional Needs:   Kcal:  1600-1800  Protein:  80-90g  Fluid:  2L/day  Natasha Bibles, MS, RD, LDN Inpatient Clinical Dietitian Contact information available via Amion

## 2023-01-22 NOTE — Progress Notes (Signed)
Patient did not have a BM this shift. Pt has a small smear of BM on bedpad when getting up to [facility], but not a measurable amount.

## 2023-01-22 NOTE — Progress Notes (Addendum)
PROGRESS NOTE    [name]  [id number] DOB: Oct 20, 1931 DOA: 01/21/2023 PCP: Corliss Blacker, MD   Brief Narrative: 89 year old with past medical history significant for A-fib on Eliquis, history of breast cancer, diabetes, hypothyroidism, GERD, glaucoma, hypertension, hyperlipidemia, OSA, presents complaining of shortness of breath.  Of note recent admission from 2/28-- 3/5 for colitis, acute on chronic heart failure and A-fib.  She was initially treated with IV diuretics was periodically hypoxic thought to be from CHF, diuretics stopped due to concern that they were contributing to diarrhea.  Underwent colonoscopy 3/4 without significant findings biopsy was subsequently negative for microscopic colitis.  She was discharged on prednisone taper.  She presented with worsening shortness of breath sudden onset 4 of 5 hours prior to admission.  No diarrhea or loose stool.  ED she was found to be tachypneic respiration rate 20-30.  Was found to be hypoxic at Oxygen Sat at 83 on room air.  CT chest negative for PE, pleural effusion better.   Assessment & Plan:   Principal Problem:   Acute hypoxic respiratory failure (HCC) Active Problems:   Diarrhea   Persistent atrial fibrillation (HCC)   DMII (diabetes mellitus, type 2) (HCC)   Post-surgical hypothyroidism   Hyperlipidemia associated with type 2 diabetes mellitus (HCC)   Essential hypertension   Depression with anxiety   Chronic diastolic CHF (congestive heart failure) (HCC)   Tachypnea   1-Acute Hypoxic Respiratory Failure: Hypoxia resolved.  Per Pulmonologist ? Panic attack ? Aspiration.  Continue with Nocturnal oxygen.  On IV ceftriaxone, due to leukocytosis, hypoxia, lactic acidosis.  ECHO negative for interatrial shunt.  Per family Patient doesn't have oxygen at home or CPAP . Plan to check Nocturnal Pulse oxygen.   Lactic acidosis; ? Secondary to Hypoxia ?, resolved with fluids.  Lactic acid increase to 5---down to  1.  Started on IV antibiotics to cover for infectious process.   Diarrhea: GI recommend to continue prior prednisone taper for possible microscopic colitis partially treated with steroid.  Stop Creon Started Lactaid.  Stop Creon.    Renal lesion: Out patient MRI  A-fib: Continue with Cardizem, Metoprolol,  Eliquis.   Chronic Diastolic heart failure: She has not been on lasix.  Appears compensated.   Hypothyroidism: On synthroid.  Free T 4 elevated 1.6 Will reduce synthroid to 125 mcg  Depression:  On sertraline.   DM type 2;  Not on home medications.   Hypokalemia; Replaced.   Estimated body mass index is 21.97 kg/m as calculated from the following:   Height as of this encounter: '5\' 5"'$  (1.651 m).   Weight as of this encounter: 59.9 kg.   DVT prophylaxis: Eliquis Code Status: Full code Family Communication: daughter at bedside.  Disposition Plan:  Status is: Observation The patient remains OBS appropriate and will d/c before 2 midnights.    Consultants:  GI Pulmonologist   Procedures:    Antimicrobials:    Subjective: She is feeling better, she denies dyspnea.  She started to have diarrhea again, family think related to lactose.   Objective: Vitals:   01/21/23 2245 01/22/23 0017 01/22/23 0500 01/22/23 0516  BP: 113/74 113/72 128/81 128/81  Pulse: 87   87  Resp:  (!) 24 (!) 24 (!) 24  Temp: 98 F (36.7 C) 98.7 F (37.1 C) 98.7 F (37.1 C) 98.7 F (37.1 C)  TempSrc: Oral Oral Oral   SpO2: 97% 100% 100%   Weight:      Height:  Intake/Output Summary (Last 24 hours) at 01/22/2023 0734 Last data filed at 01/22/2023 0600 Gross per 24 hour  Intake 320 ml  Output 400 ml  Net -80 ml   Filed Weights   01/21/23 1028  Weight: 59.9 kg    Examination:  General exam: Appears calm and comfortable  Respiratory system: Clear to auscultation. Respiratory effort normal. Cardiovascular system: S1 & S2 heard, RRR. No JVD, murmurs, rubs, gallops  or clicks. No pedal edema. Gastrointestinal system: Abdomen is nondistended, soft and nontender. No organomegaly or masses felt. Normal bowel sounds heard. Central nervous system: Alert and oriented.  Extremities: Symmetric 5 x 5 power.    Data Reviewed: I have personally reviewed following labs and imaging studies  CBC: Recent Labs  Lab 01/21/23 1115  WBC 16.9*  NEUTROABS 14.6*  HGB 13.3  HCT 40.8  MCV 99.3  PLT [id number]   Basic Metabolic Panel: Recent Labs  Lab 01/16/23 0537 01/21/23 1228  NA 138 138  K 3.9 2.9*  CL 103 103  CO2 27 25  GLUCOSE 157* 212*  BUN 19 17  CREATININE 0.64 0.69  CALCIUM 8.4* 7.6*   GFR: Estimated Creatinine Clearance: 42.9 mL/min (by C-G formula based on SCr of 0.69 mg/dL). Liver Function Tests: No results for input(s): "AST", "ALT", "ALKPHOS", "BILITOT", "PROT", "ALBUMIN" in the last 168 hours. No results for input(s): "LIPASE", "AMYLASE" in the last 168 hours. No results for input(s): "AMMONIA" in the last 168 hours. Coagulation Profile: No results for input(s): "INR", "PROTIME" in the last 168 hours. Cardiac Enzymes: No results for input(s): "CKTOTAL", "CKMB", "CKMBINDEX", "TROPONINI" in the last 168 hours. BNP (last 3 results) No results for input(s): "PROBNP" in the last 8760 hours. HbA1C: No results for input(s): "HGBA1C" in the last 72 hours. CBG: Recent Labs  Lab 01/15/23 1135 01/15/23 1738 01/15/23 2112 01/16/23 0733 01/16/23 1140  GLUCAP 140* 246* 249* 153* 174*   Lipid Profile: No results for input(s): "CHOL", "HDL", "LDLCALC", "TRIG", "CHOLHDL", "LDLDIRECT" in the last 72 hours. Thyroid Function Tests: Recent Labs    01/21/23 1827  TSH 0.975  FREET4 1.67*   Anemia Panel: No results for input(s): "VITAMINB12", "FOLATE", "FERRITIN", "TIBC", "IRON", "RETICCTPCT" in the last 72 hours. Sepsis Labs: Recent Labs  Lab 01/21/23 1827 01/21/23 2011  LATICACIDVEN 4.4* 5.8*    Recent Results (from the past 240 hour(s))   C Difficile Quick Screen w PCR reflex     Status: None   Collection Time: 01/12/23 10:19 AM   Specimen: STOOL  Result Value Ref Range Status   C Diff antigen NEGATIVE NEGATIVE Final   C Diff toxin NEGATIVE NEGATIVE Final   C Diff interpretation No C. difficile detected.  Final    Comment: Performed at [facility], [place] [address] [place], [place] [address]  Resp panel by RT-PCR (RSV, Flu A&B, Covid) Anterior Nasal Swab     Status: None   Collection Time: 01/21/23 11:15 AM   Specimen: Anterior Nasal Swab  Result Value Ref Range Status   SARS Coronavirus 2 by RT PCR NEGATIVE NEGATIVE Final    Comment: (NOTE) SARS-CoV-2 target nucleic acids are NOT DETECTED.  The SARS-CoV-2 RNA is generally detectable in upper respiratory specimens during the acute phase of infection. The lowest concentration of SARS-CoV-2 viral copies this assay can detect is 138 copies/mL. A negative result does not preclude SARS-Cov-2 infection and should not be used as the sole basis for treatment or other patient management decisions. A negative result may occur  with  improper specimen collection/handling, submission of specimen other than nasopharyngeal swab, presence of viral mutation(s) within the areas targeted by this assay, and inadequate number of viral copies(<138 copies/mL). A negative result must be combined with clinical observations, patient history, and epidemiological information. The expected result is Negative.  Fact Sheet for Patients:  EntrepreneurPulse.com.au  Fact Sheet for Healthcare Providers:  IncredibleEmployment.be  This test is no t yet approved or cleared by the [place] FDA and  has been authorized for detection and/or diagnosis of SARS-CoV-2 by FDA under an Emergency Use Authorization (EUA). This EUA will remain  in effect (meaning this test can be used) for the duration of the COVID-19 declaration under Section  564(b)(1) of the Act, 21 U.S.C.section 360bbb-3(b)(1), unless the authorization is terminated  or revoked sooner.       Influenza A by PCR NEGATIVE NEGATIVE Final   Influenza B by PCR NEGATIVE NEGATIVE Final    Comment: (NOTE) The Xpert Xpress SARS-CoV-2/FLU/RSV plus assay is intended as an aid in the diagnosis of influenza from Nasopharyngeal swab specimens and should not be used as a sole basis for treatment. Nasal washings and aspirates are unacceptable for Xpert Xpress SARS-CoV-2/FLU/RSV testing.  Fact Sheet for Patients: EntrepreneurPulse.com.au  Fact Sheet for Healthcare Providers: IncredibleEmployment.be  This test is not yet approved or cleared by the [place] FDA and has been authorized for detection and/or diagnosis of SARS-CoV-2 by FDA under an Emergency Use Authorization (EUA). This EUA will remain in effect (meaning this test can be used) for the duration of the COVID-19 declaration under Section 564(b)(1) of the Act, 21 U.S.C. section 360bbb-3(b)(1), unless the authorization is terminated or revoked.     Resp Syncytial Virus by PCR NEGATIVE NEGATIVE Final    Comment: (NOTE) Fact Sheet for Patients: EntrepreneurPulse.com.au  Fact Sheet for Healthcare Providers: IncredibleEmployment.be  This test is not yet approved or cleared by the [place] FDA and has been authorized for detection and/or diagnosis of SARS-CoV-2 by FDA under an Emergency Use Authorization (EUA). This EUA will remain in effect (meaning this test can be used) for the duration of the COVID-19 declaration under Section 564(b)(1) of the Act, 21 U.S.C. section 360bbb-3(b)(1), unless the authorization is terminated or revoked.  Performed at [facility], [place] [address], [place], [place] [address]   Respiratory (~20 pathogens) panel by PCR     Status: None   Collection Time: 01/21/23  6:46 PM    Specimen: Nasopharyngeal Swab; Respiratory  Result Value Ref Range Status   Adenovirus NOT DETECTED NOT DETECTED Final   Coronavirus 229E NOT DETECTED NOT DETECTED Final    Comment: (NOTE) The Coronavirus on the Respiratory Panel, DOES NOT test for the novel  Coronavirus (2019 nCoV)    Coronavirus HKU1 NOT DETECTED NOT DETECTED Final   Coronavirus NL63 NOT DETECTED NOT DETECTED Final   Coronavirus OC43 NOT DETECTED NOT DETECTED Final   Metapneumovirus NOT DETECTED NOT DETECTED Final   Rhinovirus / Enterovirus NOT DETECTED NOT DETECTED Final   Influenza A NOT DETECTED NOT DETECTED Final   Influenza B NOT DETECTED NOT DETECTED Final   Parainfluenza Virus 1 NOT DETECTED NOT DETECTED Final   Parainfluenza Virus 2 NOT DETECTED NOT DETECTED Final   Parainfluenza Virus 3 NOT DETECTED NOT DETECTED Final   Parainfluenza Virus 4 NOT DETECTED NOT DETECTED Final   Respiratory Syncytial Virus NOT DETECTED NOT DETECTED Final   Bordetella pertussis NOT DETECTED NOT DETECTED Final   Bordetella Parapertussis NOT DETECTED  NOT DETECTED Final   Chlamydophila pneumoniae NOT DETECTED NOT DETECTED Final   Mycoplasma pneumoniae NOT DETECTED NOT DETECTED Final    Comment: Performed at [facility], [place] [address] [place], [place] [address]         Radiology Studies: VAS [place] LOWER EXTREMITY VENOUS (DVT) (7a-7p)  Result Date: 01/21/2023  Lower Venous DVT Study Patient Name:  OMEKA REICHERT  Date of Exam:   01/21/2023 Medical Rec #: [id number]         Accession #:    [id number] Date of Birth: 1930-11-18          Patient Gender: F Patient Age:   22 years Exam Location:  [facility] Procedure:      VAS [place] LOWER EXTREMITY VENOUS (DVT) Referring Phys: HAYLEY NAASZ --------------------------------------------------------------------------------  Indications: Swelling.  Risk Factors: None identified. Limitations: Poor ultrasound/tissue interface. Comparison Study: No prior studies. Performing  Technologist: Oliver Hum RVT  Examination Guidelines: A complete evaluation includes B-mode imaging, spectral Doppler, color Doppler, and power Doppler as needed of all accessible portions of each vessel. Bilateral testing is considered an integral part of a complete examination. Limited examinations for reoccurring indications may be performed as noted. The reflux portion of the exam is performed with the patient in reverse Trendelenburg.  +---------+---------------+---------+-----------+----------+--------------+ RIGHT    CompressibilityPhasicitySpontaneityPropertiesThrombus Aging +---------+---------------+---------+-----------+----------+--------------+ CFV      Full           Yes      Yes                                 +---------+---------------+---------+-----------+----------+--------------+ SFJ      Full                                                        +---------+---------------+---------+-----------+----------+--------------+ FV Prox  Full                                                        +---------+---------------+---------+-----------+----------+--------------+ FV Mid   Full                                                        +---------+---------------+---------+-----------+----------+--------------+ FV DistalFull                                                        +---------+---------------+---------+-----------+----------+--------------+ PFV      Full                                                        +---------+---------------+---------+-----------+----------+--------------+ POP  Full           Yes      Yes                                 +---------+---------------+---------+-----------+----------+--------------+ PTV      Full                                                        +---------+---------------+---------+-----------+----------+--------------+ PERO     Full                                                         +---------+---------------+---------+-----------+----------+--------------+   +----+---------------+---------+-----------+----------+--------------+ LEFTCompressibilityPhasicitySpontaneityPropertiesThrombus Aging +----+---------------+---------+-----------+----------+--------------+ CFV Full           Yes      Yes                                 +----+---------------+---------+-----------+----------+--------------+     Summary: RIGHT: - There is no evidence of deep vein thrombosis in the lower extremity.  - No cystic structure found in the popliteal fossa.  LEFT: - No evidence of common femoral vein obstruction.  *See table(s) above for measurements and observations. Electronically signed by Jamelle Haring on 01/21/2023 at 4:34:48 PM.    Final    CT Angio Chest PE W and/or Wo Contrast  Result Date: 01/21/2023 CLINICAL DATA:  2 day history of cough and shortness of breath EXAM: CT ANGIOGRAPHY CHEST WITH CONTRAST TECHNIQUE: Multidetector CT imaging of the chest was performed using the standard protocol during bolus administration of intravenous contrast. Multiplanar CT image reconstructions and MIPs were obtained to evaluate the vascular anatomy. RADIATION DOSE REDUCTION: This exam was performed according to the departmental dose-optimization program which includes automated exposure control, adjustment of the mA and/or kV according to patient size and/or use of iterative reconstruction technique. CONTRAST:  53m OMNIPAQUE IOHEXOL 350 MG/ML SOLN COMPARISON:  Chest radiograph dated 01/21/2023, CT abdomen and pelvis dated 01/10/2023, CT chest dated 02/05/2017 FINDINGS: Cardiovascular: The study is high quality for the evaluation of pulmonary embolism. There are no filling defects in the central, lobar, segmental or subsegmental pulmonary artery branches to suggest acute pulmonary embolism. Pulmonary artery measures 3.9 cm. Multichamber cardiomegaly. No significant pericardial  fluid/thickening. Coronary artery calcifications and aortic atherosclerosis. Mediastinum/Nodes: Surgically absent thyroid gland. Patulous esophagus with small hiatal hernia. No pathologically enlarged axillary, supraclavicular, mediastinal, or hilar lymph nodes. Lungs/Pleura: The central airways are patent. No focal consolidation. No pneumothorax. Small right pleural effusion. Upper abdomen: Unchanged indeterminate exophytic posterior left upper pole 1.1 cm lesion (4:85). Musculoskeletal: No acute or abnormal lytic or blastic osseous lesions. Review of the MIP images confirms the above findings. IMPRESSION: 1. No acute pulmonary embolism. 2. Small right pleural effusion. 3. Unchanged indeterminate exophytic posterior left upper pole 1.1 cm renal lesion. Recommend nonemergent contrast-enhanced MRI for characterization after acute symptoms have resolved. 4. Aortic Atherosclerosis (ICD10-I70.0). Coronary artery calcifications. Assessment for potential risk factor modification, dietary therapy or pharmacologic therapy may be warranted, if clinically indicated. Electronically Signed  By: Darrin Nipper M.D.   On: 01/21/2023 14:25   DG Chest 2 View  Result Date: 01/21/2023 CLINICAL DATA:  89 year old female with shortness of breath, chest tightness. EXAM: CHEST - 2 VIEW COMPARISON:  Portable chest 12/22/2022 and earlier. FINDINGS: Semi upright AP and lateral views the chest 1203 hours. Regressed/resolved bilateral pleural effusions since last month. Stable cardiomegaly and mediastinal contours. Pulmonary vascularity also decreased. No pneumothorax, pulmonary edema or consolidation. Right axillary surgical clips. No acute osseous abnormality identified. Negative visible bowel gas. IMPRESSION: Chronic cardiomegaly.  No acute cardiopulmonary abnormality. Electronically Signed   By: Genevie Ann M.D.   On: 01/21/2023 12:14        Scheduled Meds:  apixaban  5 mg Oral BID   diltiazem  360 mg Oral Daily   levothyroxine  150  mcg Oral QAC breakfast   LORazepam  0.5 mg Oral QHS   metoprolol tartrate  100 mg Oral BID   pantoprazole  40 mg Oral Daily   predniSONE  30 mg Oral Q breakfast   rosuvastatin  10 mg Oral QHS   sertraline  50 mg Oral Daily   sodium chloride flush  3 mL Intravenous Q12H   Continuous Infusions:   LOS: 0 days    Time spent: 35 minutes    Esperanza Madrazo A Carnell Casamento, MD [facility]   If 7PM-7AM, please contact night-coverage [email or web address]  01/22/2023, 7:34 AM

## 2023-01-23 DIAGNOSIS — K591 Functional diarrhea: Secondary | ICD-10-CM | POA: Diagnosis not present

## 2023-01-23 DIAGNOSIS — Z853 Personal history of malignant neoplasm of breast: Secondary | ICD-10-CM | POA: Diagnosis not present

## 2023-01-23 DIAGNOSIS — F410 Panic disorder [episodic paroxysmal anxiety] without agoraphobia: Secondary | ICD-10-CM | POA: Diagnosis present

## 2023-01-23 DIAGNOSIS — R0602 Shortness of breath: Secondary | ICD-10-CM | POA: Diagnosis present

## 2023-01-23 DIAGNOSIS — Z79899 Other long term (current) drug therapy: Secondary | ICD-10-CM | POA: Diagnosis not present

## 2023-01-23 DIAGNOSIS — E874 Mixed disorder of acid-base balance: Secondary | ICD-10-CM | POA: Diagnosis present

## 2023-01-23 DIAGNOSIS — E039 Hypothyroidism, unspecified: Secondary | ICD-10-CM | POA: Diagnosis present

## 2023-01-23 DIAGNOSIS — Z1152 Encounter for screening for COVID-19: Secondary | ICD-10-CM | POA: Diagnosis not present

## 2023-01-23 DIAGNOSIS — I700 Atherosclerosis of aorta: Secondary | ICD-10-CM | POA: Diagnosis present

## 2023-01-23 DIAGNOSIS — E1142 Type 2 diabetes mellitus with diabetic polyneuropathy: Secondary | ICD-10-CM | POA: Diagnosis present

## 2023-01-23 DIAGNOSIS — Z880 Allergy status to penicillin: Secondary | ICD-10-CM | POA: Diagnosis not present

## 2023-01-23 DIAGNOSIS — E119 Type 2 diabetes mellitus without complications: Secondary | ICD-10-CM | POA: Diagnosis not present

## 2023-01-23 DIAGNOSIS — E876 Hypokalemia: Secondary | ICD-10-CM | POA: Diagnosis not present

## 2023-01-23 DIAGNOSIS — I5032 Chronic diastolic (congestive) heart failure: Secondary | ICD-10-CM | POA: Diagnosis present

## 2023-01-23 DIAGNOSIS — E440 Moderate protein-calorie malnutrition: Secondary | ICD-10-CM | POA: Diagnosis present

## 2023-01-23 DIAGNOSIS — I3481 Nonrheumatic mitral (valve) annulus calcification: Secondary | ICD-10-CM | POA: Diagnosis present

## 2023-01-23 DIAGNOSIS — J690 Pneumonitis due to inhalation of food and vomit: Secondary | ICD-10-CM | POA: Diagnosis present

## 2023-01-23 DIAGNOSIS — I110 Hypertensive heart disease with heart failure: Secondary | ICD-10-CM | POA: Diagnosis present

## 2023-01-23 DIAGNOSIS — G4733 Obstructive sleep apnea (adult) (pediatric): Secondary | ICD-10-CM | POA: Diagnosis present

## 2023-01-23 DIAGNOSIS — Z79890 Hormone replacement therapy (postmenopausal): Secondary | ICD-10-CM | POA: Diagnosis not present

## 2023-01-23 DIAGNOSIS — E7800 Pure hypercholesterolemia, unspecified: Secondary | ICD-10-CM | POA: Diagnosis present

## 2023-01-23 DIAGNOSIS — I4819 Other persistent atrial fibrillation: Secondary | ICD-10-CM | POA: Diagnosis present

## 2023-01-23 DIAGNOSIS — H409 Unspecified glaucoma: Secondary | ICD-10-CM | POA: Diagnosis present

## 2023-01-23 DIAGNOSIS — F32A Depression, unspecified: Secondary | ICD-10-CM | POA: Diagnosis present

## 2023-01-23 DIAGNOSIS — J9601 Acute respiratory failure with hypoxia: Secondary | ICD-10-CM | POA: Diagnosis present

## 2023-01-23 DIAGNOSIS — Z7901 Long term (current) use of anticoagulants: Secondary | ICD-10-CM | POA: Diagnosis not present

## 2023-01-23 DIAGNOSIS — N390 Urinary tract infection, site not specified: Secondary | ICD-10-CM | POA: Diagnosis present

## 2023-01-23 LAB — URINE CULTURE: Culture: NO GROWTH

## 2023-01-23 NOTE — Progress Notes (Signed)
Mobility Specialist - Progress Note   01/23/23 1031  Mobility  Activity Ambulated with assistance in hallway  Level of Assistance Standby assist, set-up cues, supervision of patient - no hands on  Assistive Device Front wheel walker  Distance Ambulated (ft) 90 ft  Activity Response Tolerated well  Mobility Referral Yes  $Mobility charge 1 Mobility   Pt received in chair and agreed to mobility, had no c/o pain nor discomfort. Pt returned to chair with all needs met.  Roderick Pee Mobility Specialist

## 2023-01-23 NOTE — Progress Notes (Signed)
RT note: Pt. seen for CPAP order, stated she does not use one anymore, currently on n/c.

## 2023-01-23 NOTE — Progress Notes (Signed)
Overnight pulse oximetry study started on patient at 21:55.  Patient on 21% room air.

## 2023-01-23 NOTE — Progress Notes (Signed)
Received report from off going RN. Agree with previous assessment. Pt in chair with care givers at bedside.

## 2023-01-23 NOTE — Progress Notes (Signed)
PROGRESS NOTE    SAILY DREWRY  [id number] DOB: 1931-09-12 DOA: 01/21/2023 PCP: Corliss Blacker, MD   Brief Narrative: 89 year old with past medical history significant for A-fib on Eliquis, history of breast cancer, diabetes, hypothyroidism, GERD, glaucoma, hypertension, hyperlipidemia, OSA, presents complaining of shortness of breath.  Of note recent admission from 2/28-- 3/5 for colitis, acute on chronic heart failure and A-fib.  She was initially treated with IV diuretics was periodically hypoxic thought to be from CHF, diuretics stopped due to concern that they were contributing to diarrhea.  Underwent colonoscopy 3/4 without significant findings biopsy was subsequently negative for microscopic colitis.  She was discharged on prednisone taper.  She presented with worsening shortness of breath sudden onset 4 of 5 hours prior to admission.  No diarrhea or loose stool.  ED she was found to be tachypneic respiration rate 20-30.  Was found to be hypoxic at Oxygen Sat at 83 on room air.  CT chest negative for PE, pleural effusion better.   Assessment & Plan:   Principal Problem:   Acute hypoxic respiratory failure (HCC) Active Problems:   Diarrhea   Persistent atrial fibrillation (HCC)   DMII (diabetes mellitus, type 2) (HCC)   Post-surgical hypothyroidism   Hyperlipidemia associated with type 2 diabetes mellitus (HCC)   Essential hypertension   Depression with anxiety   Chronic diastolic CHF (congestive heart failure) (HCC)   Tachypnea   Malnutrition of moderate degree   1-Acute Hypoxic Respiratory Failure: Hypoxia resolved.  Per Pulmonologist ? Panic attack ? Aspiration.  On IV ceftriaxone, due to leukocytosis, hypoxia, lactic acidosis. Cover for aspiration and UTI ECHO negative for interatrial shunt.  Per family Patient doesn't have oxygen at home or CPAP . Plan to check Nocturnal Pulse oxygen.  Nocturnal oxygen not done last night. Nurse informed RT about order.  Plan  to monitor overnight.   Lactic acidosis; ? Secondary to Hypoxia ?, resolved with fluids.  Lactic acid increase to 5---down to 1.  Started on IV antibiotics to cover for infectious process.   UTI; UA with 21-50 WBC, positive nitrates. On ceftriaxone  Needs 3 days only antibiotics.  Follow urine culture.   Diarrhea: GI recommend to continue prior prednisone taper for possible microscopic colitis partially treated with steroid.  Stop Creon Started Lactaid.  Stop Creon.  Improved.   Renal lesion: Out patient MRI  A-fib: Continue with Cardizem, Metoprolol,  Eliquis.   Chronic Diastolic heart failure: She has not been on lasix.  Appears compensated.   Hypothyroidism: On synthroid.  Free T 4 elevated 1.6 Reduce  synthroid to 125 mcg  Depression:  On sertraline.   DM type 2;  Not on home medications.   Hypokalemia; Replaced.   Estimated body mass index is 21.97 kg/m as calculated from the following:   Height as of this encounter: '5\' 5"'$  (1.651 m).   Weight as of this encounter: 59.9 kg.   DVT prophylaxis: Eliquis Code Status: Full code Family Communication: daughter at bedside 3/11, caregiver at bedside 3/12.  Disposition Plan:  Status is: Observation The patient will require care spanning > 2 midnights and should be moved to inpatient because: plan to proceed with nocturnal pulse oxymetry. Home tomorrow.     Consultants:  GI Pulmonologist   Procedures:    Antimicrobials:    Subjective: Diarrhea improved.  Oxygen has remain stable.   Objective: Vitals:   01/22/23 2058 01/22/23 2200 01/23/23 0402 01/23/23 1334  BP: 101/74 116/87 121/84 95/64  Pulse: 66  64 89  Resp: '20  19 19  '$ Temp: 97.6 F (36.4 C)  98.3 F (36.8 C) 97.8 F (36.6 C)  TempSrc: Oral  Oral Oral  SpO2: 98%  98% 99%  Weight:      Height:        Intake/Output Summary (Last 24 hours) at 01/23/2023 1341 Last data filed at 01/23/2023 1003 Gross per 24 hour  Intake 970 ml  Output 950  ml  Net 20 ml    Filed Weights   01/21/23 1028  Weight: 59.9 kg    Examination:  General exam: NAD Respiratory system: CTA Cardiovascular system: S 1, S 2 RRR Gastrointestinal system: BS present, soft,  Central nervous system: Alert, follow  command Extremities: no edema    Data Reviewed: I have personally reviewed following labs and imaging studies  CBC: Recent Labs  Lab 01/21/23 1115 01/22/23 0759  WBC 16.9* 10.7*  NEUTROABS 14.6*  --   HGB 13.3 12.0  HCT 40.8 36.7  MCV 99.3 99.2  PLT 196 [id number]    Basic Metabolic Panel: Recent Labs  Lab 01/21/23 1228 01/22/23 0759  NA 138 135  K 2.9* 3.5  CL 103 99  CO2 25 29  GLUCOSE 212* 156*  BUN 17 21  CREATININE 0.69 0.57  CALCIUM 7.6* 8.1*    GFR: Estimated Creatinine Clearance: 42.9 mL/min (by C-G formula based on SCr of 0.57 mg/dL). Liver Function Tests: Recent Labs  Lab 01/22/23 0759  AST 20  ALT 42  ALKPHOS 54  BILITOT 1.2  PROT 5.4*  ALBUMIN 2.9*   No results for input(s): "LIPASE", "AMYLASE" in the last 168 hours. No results for input(s): "AMMONIA" in the last 168 hours. Coagulation Profile: No results for input(s): "INR", "PROTIME" in the last 168 hours. Cardiac Enzymes: No results for input(s): "CKTOTAL", "CKMB", "CKMBINDEX", "TROPONINI" in the last 168 hours. BNP (last 3 results) No results for input(s): "PROBNP" in the last 8760 hours. HbA1C: No results for input(s): "HGBA1C" in the last 72 hours. CBG: No results for input(s): "GLUCAP" in the last 168 hours.  Lipid Profile: No results for input(s): "CHOL", "HDL", "LDLCALC", "TRIG", "CHOLHDL", "LDLDIRECT" in the last 72 hours. Thyroid Function Tests: Recent Labs    01/21/23 1827  TSH 0.975  FREET4 1.67*    Anemia Panel: No results for input(s): "VITAMINB12", "FOLATE", "FERRITIN", "TIBC", "IRON", "RETICCTPCT" in the last 72 hours. Sepsis Labs: Recent Labs  Lab 01/21/23 1827 01/21/23 2011 01/22/23 0754 01/22/23 0759   PROCALCITON  --   --   --  0.11  LATICACIDVEN 4.4* 5.8* 1.8  --      Recent Results (from the past 240 hour(s))  Resp panel by RT-PCR (RSV, Flu A&B, Covid) Anterior Nasal Swab     Status: None   Collection Time: 01/21/23 11:15 AM   Specimen: Anterior Nasal Swab  Result Value Ref Range Status   SARS Coronavirus 2 by RT PCR NEGATIVE NEGATIVE Final    Comment: (NOTE) SARS-CoV-2 target nucleic acids are NOT DETECTED.  The SARS-CoV-2 RNA is generally detectable in upper respiratory specimens during the acute phase of infection. The lowest concentration of SARS-CoV-2 viral copies this assay can detect is 138 copies/mL. A negative result does not preclude SARS-Cov-2 infection and should not be used as the sole basis for treatment or other patient management decisions. A negative result may occur with  improper specimen collection/handling, submission of specimen other than nasopharyngeal swab, presence of viral mutation(s) within the areas targeted by this assay, and  inadequate number of viral copies(<138 copies/mL). A negative result must be combined with clinical observations, patient history, and epidemiological information. The expected result is Negative.  Fact Sheet for Patients:  EntrepreneurPulse.com.au  Fact Sheet for Healthcare Providers:  IncredibleEmployment.be  This test is no t yet approved or cleared by the [place] FDA and  has been authorized for detection and/or diagnosis of SARS-CoV-2 by FDA under an Emergency Use Authorization (EUA). This EUA will remain  in effect (meaning this test can be used) for the duration of the COVID-19 declaration under Section 564(b)(1) of the Act, 21 U.S.C.section 360bbb-3(b)(1), unless the authorization is terminated  or revoked sooner.       Influenza A by PCR NEGATIVE NEGATIVE Final   Influenza B by PCR NEGATIVE NEGATIVE Final    Comment: (NOTE) The Xpert Xpress SARS-CoV-2/FLU/RSV plus  assay is intended as an aid in the diagnosis of influenza from Nasopharyngeal swab specimens and should not be used as a sole basis for treatment. Nasal washings and aspirates are unacceptable for Xpert Xpress SARS-CoV-2/FLU/RSV testing.  Fact Sheet for Patients: EntrepreneurPulse.com.au  Fact Sheet for Healthcare Providers: IncredibleEmployment.be  This test is not yet approved or cleared by the [place] FDA and has been authorized for detection and/or diagnosis of SARS-CoV-2 by FDA under an Emergency Use Authorization (EUA). This EUA will remain in effect (meaning this test can be used) for the duration of the COVID-19 declaration under Section 564(b)(1) of the Act, 21 U.S.C. section 360bbb-3(b)(1), unless the authorization is terminated or revoked.     Resp Syncytial Virus by PCR NEGATIVE NEGATIVE Final    Comment: (NOTE) Fact Sheet for Patients: EntrepreneurPulse.com.au  Fact Sheet for Healthcare Providers: IncredibleEmployment.be  This test is not yet approved or cleared by the [place] FDA and has been authorized for detection and/or diagnosis of SARS-CoV-2 by FDA under an Emergency Use Authorization (EUA). This EUA will remain in effect (meaning this test can be used) for the duration of the COVID-19 declaration under Section 564(b)(1) of the Act, 21 U.S.C. section 360bbb-3(b)(1), unless the authorization is terminated or revoked.  Performed at [facility], [place] [address] [place], [place] [address]   Culture, blood (Routine X 2) w Reflex to ID Panel     Status: None (Preliminary result)   Collection Time: 01/21/23  6:25 PM   Specimen: BLOOD  Result Value Ref Range Status   Specimen Description   Final    BLOOD BLOOD LEFT HAND Performed at [facility] [address], [place], [place] [address]    Special Requests   Final    BOTTLES DRAWN  AEROBIC AND ANAEROBIC Blood Culture adequate volume Performed at [facility] [address], [place], [place] [address]    Culture   Final    NO GROWTH 2 DAYS Performed at [facility] [address] [place], [place] [address]    Report Status PENDING  Incomplete  Culture, blood (Routine X 2) w Reflex to ID Panel     Status: None (Preliminary result)   Collection Time: 01/21/23  6:27 PM   Specimen: BLOOD  Result Value Ref Range Status   Specimen Description   Final    BLOOD BLOOD RIGHT HAND Performed at [facility] [address] [place],  [address]    Special Requests   Final    AEROBIC BOTTLE ONLY Blood Culture adequate volume Performed at [facility] [address]  [place], [place] [address]    Culture   Final    NO GROWTH 2 DAYS Performed at [facility], [place] [address], [place], [place] [address]    Report Status PENDING  Incomplete  Respiratory (~20 pathogens) panel by PCR     Status: None   Collection Time: 01/21/23  6:46 PM   Specimen: Nasopharyngeal Swab; Respiratory  Result Value Ref Range Status   Adenovirus NOT DETECTED NOT DETECTED Final   Coronavirus 229E NOT DETECTED NOT DETECTED Final    Comment: (NOTE) The Coronavirus on the Respiratory Panel, DOES NOT test for the novel  Coronavirus (2019 nCoV)    Coronavirus HKU1 NOT DETECTED NOT DETECTED Final   Coronavirus NL63 NOT DETECTED NOT DETECTED Final   Coronavirus OC43 NOT DETECTED NOT DETECTED Final   Metapneumovirus NOT DETECTED NOT DETECTED Final   Rhinovirus / Enterovirus NOT DETECTED NOT DETECTED Final   Influenza A NOT DETECTED NOT DETECTED Final   Influenza B NOT DETECTED NOT DETECTED Final   Parainfluenza Virus 1 NOT DETECTED NOT DETECTED Final   Parainfluenza Virus 2 NOT DETECTED NOT DETECTED Final   Parainfluenza Virus 3 NOT DETECTED NOT DETECTED Final   Parainfluenza Virus 4 NOT DETECTED NOT DETECTED Final    Respiratory Syncytial Virus NOT DETECTED NOT DETECTED Final   Bordetella pertussis NOT DETECTED NOT DETECTED Final   Bordetella Parapertussis NOT DETECTED NOT DETECTED Final   Chlamydophila pneumoniae NOT DETECTED NOT DETECTED Final   Mycoplasma pneumoniae NOT DETECTED NOT DETECTED Final    Comment: Performed at [facility], [place]. [address], [place], [place] [address]         Radiology Studies: ECHOCARDIOGRAM LIMITED BUBBLE STUDY  Result Date: 01/22/2023    ECHOCARDIOGRAM LIMITED REPORT   Patient Name:   YANIL TROUPE Date of Exam: 01/22/2023 Medical Rec #:  [id number]        Height:       65.0 in Accession #:    [id number]       Weight:       132.0 lb Date of Birth:  07/12/1931         BSA:          1.658 m Patient Age:    70 years         BP:           124/85 mmHg Patient Gender: F                HR:           107 bpm. Exam Location:  Inpatient Procedure: Limited Echo, Cardiac Doppler, Color Doppler and Saline Contrast            Bubble Study Indications:    hypoxia  History:        Patient has prior history of Echocardiogram examinations.                 Cardiomegaly, Arrythmias:Atrial Fibrillation; Risk                 Factors:Hypertension and Diabetes.  Sonographer:    Phineas Douglas Referring Phys: [phone] Ermon Sagan A Marcial Pless IMPRESSIONS  1. Left ventricular ejection fraction, by estimation, is 65 to 70%. The left ventricle has normal function. The left ventricle has no regional wall motion abnormalities. There is moderate concentric left ventricular hypertrophy.  2. Right ventricular systolic function is moderately reduced. The right ventricular size is mildly enlarged. There is mildly elevated pulmonary artery systolic pressure.  3. Left atrial size was moderately dilated.  4. Right atrial size was mild to moderately dilated.  5. The mitral valve is degenerative. Trivial mitral valve regurgitation. No evidence of mitral stenosis. Moderate mitral annular calcification.  6. The aortic valve is  tricuspid. Aortic valve regurgitation is mild. No aortic stenosis is present. Aortic regurgitation PHT measures 425 msec.  7. The inferior vena cava is dilated in size with >50% respiratory variability, suggesting right atrial pressure of 8 mmHg.  8. Agitated saline contrast bubble study was negative, with no evidence of any interatrial shunt. FINDINGS  Left Ventricle: Left ventricular ejection fraction, by estimation, is 65 to 70%. The left ventricle has normal function. The left ventricle has no regional wall motion abnormalities. The left ventricular internal cavity size was normal in size. There is  moderate concentric left ventricular hypertrophy. Right Ventricle: The right ventricular size is mildly enlarged. No increase in right ventricular wall thickness. Right ventricular systolic function is moderately reduced. There is mildly elevated pulmonary artery systolic pressure. The tricuspid regurgitant velocity is 2.91 m/s, and with an assumed right atrial pressure of 8 mmHg, the estimated right ventricular systolic pressure is 99991111 mmHg. Left Atrium: Left atrial size was moderately dilated. Right Atrium: Right atrial size was mild to moderately dilated. Pericardium: There is no evidence of pericardial effusion. Mitral Valve: The mitral valve is degenerative in appearance. Moderate mitral annular calcification. Trivial mitral valve regurgitation. No evidence of mitral valve stenosis. Tricuspid Valve: The tricuspid valve is normal in structure. Tricuspid valve regurgitation is mild . No evidence of tricuspid stenosis. Aortic Valve: The aortic valve is tricuspid. Aortic valve regurgitation is mild. Aortic regurgitation PHT measures 425 msec. No aortic stenosis is present. Pulmonic Valve: The pulmonic valve was normal in structure. Pulmonic valve regurgitation is not visualized. No evidence of pulmonic stenosis. Aorta: The aortic root is normal in size and structure. Venous: The inferior vena cava is dilated in  size with greater than 50% respiratory variability, suggesting right atrial pressure of 8 mmHg. IAS/Shunts: No atrial level shunt detected by color flow Doppler. Agitated saline contrast was given intravenously to evaluate for intracardiac shunting. Agitated saline contrast bubble study was negative, with no evidence of any interatrial shunt. LEFT VENTRICLE PLAX 2D LVIDd:         3.30 cm LVIDs:         2.10 cm LV PW:         1.40 cm LV IVS:        1.60 cm  LV Volumes (MOD) LV vol d, MOD A2C: 77.2 ml LV vol d, MOD A4C: 77.9 ml LV vol s, MOD A2C: 23.7 ml LV vol s, MOD A4C: 24.7 ml LV SV MOD A2C:     53.5 ml LV SV MOD A4C:     77.9 ml LV SV MOD BP:      52.6 ml IVC IVC diam: 2.30 cm AORTIC VALVE AI PHT:      425 msec TRICUSPID VALVE TR Peak grad:   33.9 mmHg TR Vmax:        291.00 cm/s Glori Bickers MD Electronically signed by Glori Bickers MD Signature Date/Time: 01/22/2023/10:02:31 AM    Final    VAS [place] LOWER EXTREMITY VENOUS (DVT) (7a-7p)  Result Date: 01/21/2023  Lower Venous DVT Study Patient Name:  FAWN COMBES  Date of Exam:   01/21/2023 Medical Rec #: [id number]         Accession #:    [id number] Date of Birth:  Apr 02, 1931          Patient Gender: F Patient Age:   67 years Exam Location:  [facility] Procedure:      VAS [place] LOWER EXTREMITY VENOUS (DVT) Referring Phys: HAYLEY NAASZ --------------------------------------------------------------------------------  Indications: Swelling.  Risk Factors: None identified. Limitations: Poor ultrasound/tissue interface. Comparison Study: No prior studies. Performing Technologist: Oliver Hum RVT  Examination Guidelines: A complete evaluation includes B-mode imaging, spectral Doppler, color Doppler, and power Doppler as needed of all accessible portions of each vessel. Bilateral testing is considered an integral part of a complete examination. Limited examinations for reoccurring indications may be performed as noted. The reflux portion of the exam is  performed with the patient in reverse Trendelenburg.  +---------+---------------+---------+-----------+----------+--------------+ RIGHT    CompressibilityPhasicitySpontaneityPropertiesThrombus Aging +---------+---------------+---------+-----------+----------+--------------+ CFV      Full           Yes      Yes                                 +---------+---------------+---------+-----------+----------+--------------+ SFJ      Full                                                        +---------+---------------+---------+-----------+----------+--------------+ FV Prox  Full                                                        +---------+---------------+---------+-----------+----------+--------------+ FV Mid   Full                                                        +---------+---------------+---------+-----------+----------+--------------+ FV DistalFull                                                        +---------+---------------+---------+-----------+----------+--------------+ PFV      Full                                                        +---------+---------------+---------+-----------+----------+--------------+ POP      Full           Yes      Yes                                 +---------+---------------+---------+-----------+----------+--------------+ PTV      Full                                                        +---------+---------------+---------+-----------+----------+--------------+  PERO     Full                                                        +---------+---------------+---------+-----------+----------+--------------+   +----+---------------+---------+-----------+----------+--------------+ LEFTCompressibilityPhasicitySpontaneityPropertiesThrombus Aging +----+---------------+---------+-----------+----------+--------------+ CFV Full           Yes      Yes                                  +----+---------------+---------+-----------+----------+--------------+     Summary: RIGHT: - There is no evidence of deep vein thrombosis in the lower extremity.  - No cystic structure found in the popliteal fossa.  LEFT: - No evidence of common femoral vein obstruction.  *See table(s) above for measurements and observations. Electronically signed by Jamelle Haring on 01/21/2023 at 4:34:48 PM.    Final    CT Angio Chest PE W and/or Wo Contrast  Result Date: 01/21/2023 CLINICAL DATA:  2 day history of cough and shortness of breath EXAM: CT ANGIOGRAPHY CHEST WITH CONTRAST TECHNIQUE: Multidetector CT imaging of the chest was performed using the standard protocol during bolus administration of intravenous contrast. Multiplanar CT image reconstructions and MIPs were obtained to evaluate the vascular anatomy. RADIATION DOSE REDUCTION: This exam was performed according to the departmental dose-optimization program which includes automated exposure control, adjustment of the mA and/or kV according to patient size and/or use of iterative reconstruction technique. CONTRAST:  11m OMNIPAQUE IOHEXOL 350 MG/ML SOLN COMPARISON:  Chest radiograph dated 01/21/2023, CT abdomen and pelvis dated 01/10/2023, CT chest dated 02/05/2017 FINDINGS: Cardiovascular: The study is high quality for the evaluation of pulmonary embolism. There are no filling defects in the central, lobar, segmental or subsegmental pulmonary artery branches to suggest acute pulmonary embolism. Pulmonary artery measures 3.9 cm. Multichamber cardiomegaly. No significant pericardial fluid/thickening. Coronary artery calcifications and aortic atherosclerosis. Mediastinum/Nodes: Surgically absent thyroid gland. Patulous esophagus with small hiatal hernia. No pathologically enlarged axillary, supraclavicular, mediastinal, or hilar lymph nodes. Lungs/Pleura: The central airways are patent. No focal consolidation. No pneumothorax. Small right pleural effusion. Upper  abdomen: Unchanged indeterminate exophytic posterior left upper pole 1.1 cm lesion (4:85). Musculoskeletal: No acute or abnormal lytic or blastic osseous lesions. Review of the MIP images confirms the above findings. IMPRESSION: 1. No acute pulmonary embolism. 2. Small right pleural effusion. 3. Unchanged indeterminate exophytic posterior left upper pole 1.1 cm renal lesion. Recommend nonemergent contrast-enhanced MRI for characterization after acute symptoms have resolved. 4. Aortic Atherosclerosis (ICD10-I70.0). Coronary artery calcifications. Assessment for potential risk factor modification, dietary therapy or pharmacologic therapy may be warranted, if clinically indicated. Electronically Signed   By: LDarrin NipperM.D.   On: 01/21/2023 14:25        Scheduled Meds:  apixaban  5 mg Oral BID   diltiazem  360 mg Oral Daily   feeding supplement (KATE FARMS STANDARD 1.4)  325 mL Oral BID BM   lactase  6,000 Units Oral TID WC   levothyroxine  125 mcg Oral QAC breakfast   LORazepam  0.5 mg Oral QHS   metoprolol tartrate  100 mg Oral BID   multivitamin with minerals  1 tablet Oral Daily   pantoprazole  40 mg Oral Daily   predniSONE  30 mg Oral Q breakfast   rosuvastatin  10 mg Oral QHS   saccharomyces boulardii  250 mg Oral BID   sertraline  50 mg Oral Daily   sodium chloride flush  3 mL Intravenous Q12H   Continuous Infusions:  cefTRIAXone (ROCEPHIN)  IV 2 g (01/23/23 1003)     LOS: 0 days    Time spent: 35 minutes    Matias Thurman A Simmone Cape, MD [facility]   If 7PM-7AM, please contact night-coverage [email or web address]  01/23/2023, 1:41 PM

## 2023-01-24 ENCOUNTER — Ambulatory Visit: Payer: Medicare PPO | Admitting: Physician Assistant

## 2023-01-24 DIAGNOSIS — E119 Type 2 diabetes mellitus without complications: Secondary | ICD-10-CM | POA: Diagnosis not present

## 2023-01-24 DIAGNOSIS — F418 Other specified anxiety disorders: Secondary | ICD-10-CM

## 2023-01-24 DIAGNOSIS — K591 Functional diarrhea: Secondary | ICD-10-CM

## 2023-01-24 DIAGNOSIS — I4819 Other persistent atrial fibrillation: Secondary | ICD-10-CM | POA: Diagnosis not present

## 2023-01-24 DIAGNOSIS — I5032 Chronic diastolic (congestive) heart failure: Secondary | ICD-10-CM

## 2023-01-24 DIAGNOSIS — J9601 Acute respiratory failure with hypoxia: Secondary | ICD-10-CM | POA: Diagnosis not present

## 2023-01-24 DIAGNOSIS — I10 Essential (primary) hypertension: Secondary | ICD-10-CM

## 2023-01-24 DIAGNOSIS — E890 Postprocedural hypothyroidism: Secondary | ICD-10-CM

## 2023-01-24 MED ORDER — SACCHAROMYCES BOULARDII 250 MG PO CAPS: 250.0000 mg | ORAL_CAPSULE | Freq: Two times a day (BID) | ORAL | Status: DC

## 2023-01-24 MED ORDER — LACTASE 3000 UNITS PO TABS: 6000.0000 [IU] | ORAL_TABLET | Freq: Three times a day (TID) | ORAL | 1 refills | Status: DC

## 2023-01-24 MED ORDER — CEFDINIR 300 MG PO CAPS: 300.0000 mg | ORAL_CAPSULE | Freq: Two times a day (BID) | ORAL | 0 refills | Status: AC

## 2023-01-24 MED ORDER — ADULT MULTIVITAMIN W/MINERALS CH: 1.0000 | ORAL_TABLET | Freq: Every day | ORAL | Status: DC

## 2023-01-24 MED ORDER — CEFDINIR 300 MG PO CAPS: 300.0000 mg | ORAL_CAPSULE | Freq: Two times a day (BID) | ORAL | Status: DC

## 2023-01-24 MED ORDER — KATE FARMS STANDARD 1.4 PO LIQD
325.0000 mL | ORAL | Status: DC
  Filled 2023-01-24: qty 325

## 2023-01-24 NOTE — Progress Notes (Signed)
Overnight pulse oximetry study completed and report sent to nurse's station to be placed in the patient's chart.

## 2023-01-24 NOTE — TOC Transition Note (Signed)
Transition of Care [facility]) - CM/SW Discharge Note   Patient Details  Name: [name] MRN: [id number] Date of Birth: 09-Nov-1931  Transition of Care [facility]) CM/SW Contact:  Illene Regulus, LCSW Phone Number: 01/24/2023, 3:27 PM   Clinical Narrative:     CSW spoke with pt about home O2 recommendations pt is agreeable and has not preference  in agency. CSW CSW spoke with [place] with [employer] and faxed Overnight pulse oximetry study over. Pt is active with Alvis Lemmings for [facility] services. No additional needs TOC sign off.     Barriers to Discharge: Continued Medical Work up   Patient Goals and CMS Choice      Discharge Placement                         Discharge Plan and Services Additional resources added to the After Visit Summary for                                       Social Determinants of Health (SDOH) Interventions SDOH Screenings   Food Insecurity: No Food Insecurity (01/21/2023)  Housing: Low Risk  (01/21/2023)  Transportation Needs: No Transportation Needs (01/21/2023)  Utilities: Not At Risk (01/21/2023)  Tobacco Use: Low Risk  (01/21/2023)     Readmission Risk Interventions    01/15/2023    8:56 AM  Readmission Risk Prevention Plan  Transportation Screening Complete  PCP or Specialist Appt within 3-5 Days Complete  HRI or [place] Complete  Social Work Consult for [place] Planning/Counseling Complete  Palliative Care Screening Not Applicable  Medication Review Press photographer) Complete

## 2023-01-24 NOTE — Progress Notes (Incomplete)
PROGRESS NOTE    [name]  [id number] DOB: 06-19-1931 DOA: 01/21/2023 PCP: Corliss Blacker, MD (Confirm with patient/family/NH records and if not entered, this HAS to be entered at [facility] point of entry. "No PCP" if truly none.)   Chief Complaint  Patient presents with   Shortness of Breath    Brief Narrative: (Start on day 1 of progress note - keep it brief and live) ***   Assessment & Plan:   Principal Problem:   Acute hypoxic respiratory failure ([place]) Active Problems:   Diarrhea   Persistent atrial fibrillation (HCC)   DMII (diabetes mellitus, type 2) (HCC)   Post-surgical hypothyroidism   Hyperlipidemia associated with type 2 diabetes mellitus ([place])   Essential hypertension   Depression with anxiety   Chronic diastolic CHF (congestive heart failure) (HCC)   Tachypnea   Malnutrition of moderate degree   ***   DVT prophylaxis: (Lovenox/Heparin/SCD's/anticoagulated/None (if comfort care) Code Status: (Full/Partial - specify details) Family Communication: (Specify name, relationship & date discussed. NO "discussed with patient") Disposition:   Status is: Inpatient {Inpatient:23812}   Consultants:  ***  Procedures: (Don't include imaging studies which can be auto populated. Include things that cannot be auto populated i.e. Echo, Carotid and venous dopplers, Foley, Bipap, HD, tubes/drains, wound vac, central lines etc) ***  Antimicrobials: (specify start and planned stop date. Auto populated tables are space occupying and do not give end dates) ***    Subjective: ***  Objective: Vitals:   01/23/23 1334 01/23/23 1947 01/23/23 2007 01/24/23 0445  BP: 95/64 107/69 99/69 136/61  Pulse: 89 (!) 101 75 82  Resp: 19   18  Temp: 97.8 F (36.6 C) 98 F (36.7 C)  98 F (36.7 C)  TempSrc: Oral Oral  Oral  SpO2: 99% 97%  93%  Weight:      Height:        Intake/Output Summary (Last 24 hours) at 01/24/2023 1250 Last data filed at 01/24/2023  0911 Gross per 24 hour  Intake 690 ml  Output 450 ml  Net 240 ml   Filed Weights   01/21/23 1028  Weight: 59.9 kg    Examination:  General exam: Appears calm and comfortable  Respiratory system: Clear to auscultation. Respiratory effort normal. Cardiovascular system: S1 & S2 heard, RRR. No JVD, murmurs, rubs, gallops or clicks. No pedal edema. Gastrointestinal system: Abdomen is nondistended, soft and nontender. No organomegaly or masses felt. Normal bowel sounds heard. Central nervous system: Alert and oriented. No focal neurological deficits. Extremities: Symmetric 5 x 5 power. Skin: No rashes, lesions or ulcers Psychiatry: Judgement and insight appear normal. Mood & affect appropriate.     Data Reviewed: I have personally reviewed following labs and imaging studies  CBC: Recent Labs  Lab 01/21/23 1115 01/22/23 0759  WBC 16.9* 10.7*  NEUTROABS 14.6*  --   HGB 13.3 12.0  HCT 40.8 36.7  MCV 99.3 99.2  PLT 196 [id number]    Basic Metabolic Panel: Recent Labs  Lab 01/21/23 1228 01/22/23 0759  NA 138 135  K 2.9* 3.5  CL 103 99  CO2 25 29  GLUCOSE 212* 156*  BUN 17 21  CREATININE 0.69 0.57  CALCIUM 7.6* 8.1*    GFR: Estimated Creatinine Clearance: 42.9 mL/min (by C-G formula based on SCr of 0.57 mg/dL).  Liver Function Tests: Recent Labs  Lab 01/22/23 0759  AST 20  ALT 42  ALKPHOS 54  BILITOT 1.2  PROT 5.4*  ALBUMIN 2.9*  CBG: No results for input(s): "GLUCAP" in the last 168 hours.   Recent Results (from the past 240 hour(s))  Resp panel by RT-PCR (RSV, Flu A&B, Covid) Anterior Nasal Swab     Status: None   Collection Time: 01/21/23 11:15 AM   Specimen: Anterior Nasal Swab  Result Value Ref Range Status   SARS Coronavirus 2 by RT PCR NEGATIVE NEGATIVE Final    Comment: (NOTE) SARS-CoV-2 target nucleic acids are NOT DETECTED.  The SARS-CoV-2 RNA is generally detectable in upper respiratory specimens during the acute phase of infection. The  lowest concentration of SARS-CoV-2 viral copies this assay can detect is 138 copies/mL. A negative result does not preclude SARS-Cov-2 infection and should not be used as the sole basis for treatment or other patient management decisions. A negative result may occur with  improper specimen collection/handling, submission of specimen other than nasopharyngeal swab, presence of viral mutation(s) within the areas targeted by this assay, and inadequate number of viral copies(<138 copies/mL). A negative result must be combined with clinical observations, patient history, and epidemiological information. The expected result is Negative.  Fact Sheet for Patients:  EntrepreneurPulse.com.au  Fact Sheet for Healthcare Providers:  IncredibleEmployment.be  This test is no t yet approved or cleared by the [place] FDA and  has been authorized for detection and/or diagnosis of SARS-CoV-2 by FDA under an Emergency Use Authorization (EUA). This EUA will remain  in effect (meaning this test can be used) for the duration of the COVID-19 declaration under Section 564(b)(1) of the Act, 21 U.S.C.section 360bbb-3(b)(1), unless the authorization is terminated  or revoked sooner.       Influenza A by PCR NEGATIVE NEGATIVE Final   Influenza B by PCR NEGATIVE NEGATIVE Final    Comment: (NOTE) The Xpert Xpress SARS-CoV-2/FLU/RSV plus assay is intended as an aid in the diagnosis of influenza from Nasopharyngeal swab specimens and should not be used as a sole basis for treatment. Nasal washings and aspirates are unacceptable for Xpert Xpress SARS-CoV-2/FLU/RSV testing.  Fact Sheet for Patients: EntrepreneurPulse.com.au  Fact Sheet for Healthcare Providers: IncredibleEmployment.be  This test is not yet approved or cleared by the [place] FDA and has been authorized for detection and/or diagnosis of SARS-CoV-2 by FDA under  an Emergency Use Authorization (EUA). This EUA will remain in effect (meaning this test can be used) for the duration of the COVID-19 declaration under Section 564(b)(1) of the Act, 21 U.S.C. section 360bbb-3(b)(1), unless the authorization is terminated or revoked.     Resp Syncytial Virus by PCR NEGATIVE NEGATIVE Final    Comment: (NOTE) Fact Sheet for Patients: EntrepreneurPulse.com.au  Fact Sheet for Healthcare Providers: IncredibleEmployment.be  This test is not yet approved or cleared by the [place] FDA and has been authorized for detection and/or diagnosis of SARS-CoV-2 by FDA under an Emergency Use Authorization (EUA). This EUA will remain in effect (meaning this test can be used) for the duration of the COVID-19 declaration under Section 564(b)(1) of the Act, 21 U.S.C. section 360bbb-3(b)(1), unless the authorization is terminated or revoked.  Performed at [facility], [place] [address], [place], [place] [address]   Culture, blood (Routine X 2) w Reflex to ID Panel     Status: None (Preliminary result)   Collection Time: 01/21/23  6:25 PM   Specimen: BLOOD  Result Value Ref Range Status   Specimen Description   Final    BLOOD BLOOD LEFT HAND Performed at [facility]  [address] [place], [place] [address]    Special Requests   Final    BOTTLES DRAWN AEROBIC AND ANAEROBIC Blood Culture adequate volume Performed at [facility] [address], [place], [place] [address]    Culture   Final    NO GROWTH 3 DAYS Performed at [facility], [place] [address], [place], [place] [address]    Report Status PENDING  Incomplete  Culture, blood (Routine X 2) w Reflex to ID Panel     Status: None (Preliminary result)   Collection Time: 01/21/23  6:27 PM   Specimen: BLOOD  Result Value Ref Range Status   Specimen Description   Final    BLOOD BLOOD RIGHT HAND Performed at  [place] [address], [place], [place] [address]    Special Requests   Final    AEROBIC BOTTLE ONLY Blood Culture adequate volume Performed at [facility] [address], [place], [place] [address]    Culture   Final    NO GROWTH 3 DAYS Performed at [facility], [place] [address], [place], [place] [address]    Report Status PENDING  Incomplete  Respiratory (~20 pathogens) panel by PCR     Status: None   Collection Time: 01/21/23  6:46 PM   Specimen: Nasopharyngeal Swab; Respiratory  Result Value Ref Range Status   Adenovirus NOT DETECTED NOT DETECTED Final   Coronavirus 229E NOT DETECTED NOT DETECTED Final    Comment: (NOTE) The Coronavirus on the Respiratory Panel, DOES NOT test for the novel  Coronavirus (2019 nCoV)    Coronavirus HKU1 NOT DETECTED NOT DETECTED Final   Coronavirus NL63 NOT DETECTED NOT DETECTED Final   Coronavirus OC43 NOT DETECTED NOT DETECTED Final   Metapneumovirus NOT DETECTED NOT DETECTED Final   Rhinovirus / Enterovirus NOT DETECTED NOT DETECTED Final   Influenza A NOT DETECTED NOT DETECTED Final   Influenza B NOT DETECTED NOT DETECTED Final   Parainfluenza Virus 1 NOT DETECTED NOT DETECTED Final   Parainfluenza Virus 2 NOT DETECTED NOT DETECTED Final   Parainfluenza Virus 3 NOT DETECTED NOT DETECTED Final   Parainfluenza Virus 4 NOT DETECTED NOT DETECTED Final   Respiratory Syncytial Virus NOT DETECTED NOT DETECTED Final   Bordetella pertussis NOT DETECTED NOT DETECTED Final   Bordetella Parapertussis NOT DETECTED NOT DETECTED Final   Chlamydophila pneumoniae NOT DETECTED NOT DETECTED Final   Mycoplasma pneumoniae NOT DETECTED NOT DETECTED Final    Comment: Performed at [facility], [place]. [address] [place], [place] [address]  Urine Culture     Status: None   Collection Time: 01/22/23  7:49 AM   Specimen: Urine, Random  Result Value Ref Range Status   Specimen Description   Final    URINE,  RANDOM Performed at [facility] [address], [place], [place] [address]    Special Requests   Final    NONE Reflexed from [phone] Performed at [facility], [place] [address] [place], [place] [address]    Culture   Final    NO GROWTH Performed at [facility], [place] [address], [place], [place] [address]    Report Status 01/23/2023 FINAL  Final         Radiology Studies: No results found.      Scheduled Meds:  apixaban  5 mg Oral BID   [START ON 01/25/2023] cefdinir  300 mg Oral Q12H   diltiazem  360  mg Oral Daily   feeding supplement (KATE FARMS STANDARD 1.4)  325 mL Oral 2 times per day   lactase  6,000 Units Oral TID WC   levothyroxine  125 mcg Oral QAC breakfast   LORazepam  0.5 mg Oral QHS   metoprolol tartrate  100 mg Oral BID   [START ON 01/25/2023] multivitamin with minerals  1 tablet Oral Q lunch   pantoprazole  40 mg Oral Daily   predniSONE  30 mg Oral Q breakfast   rosuvastatin  10 mg Oral QHS   saccharomyces boulardii  250 mg Oral BID   sertraline  50 mg Oral Daily   sodium chloride flush  3 mL Intravenous Q12H   Continuous Infusions:   LOS: 1 day    Time spent: ***    Irine Seal, MD [facility]   To contact the attending provider between 7A-7P or the covering provider during after hours 7P-7A, please log into the web site [email or web address] and access using universal [place] password for that web site. If you do not have the password, please call the hospital operator.  01/24/2023, 12:50 PM

## 2023-01-24 NOTE — Progress Notes (Signed)
Physical Therapy Treatment Patient Details Name: [name] MRN: [id number] DOB: 1931/11/12 Today's Date: 01/24/2023   History of Present Illness Pt is 89 yo female admitted 01/21/23 with acute hypoxic resp failure.  Pt with hx including but not limited to afib, breast CA, DM, hypothyroidism, GERD, glaucoma, HTN, HLD, OSA    PT Comments    Pt making good progress.  She was able to increase ambulation to 100' with RW and min guard.  She was on RA with VSS.  Pt has 24 hr assist at home.  Continue POC while admitted.     Recommendations for follow up therapy are one component of a multi-disciplinary discharge planning process, led by the attending physician.  Recommendations may be updated based on patient status, additional functional criteria and insurance authorization.  Follow Up Recommendations  Home health PT     Assistance Recommended at Discharge Intermittent Supervision/Assistance  Patient can return home with the following Assistance with cooking/housework;Assist for transportation;Help with stairs or ramp for entrance;A little help with walking and/or transfers;A little help with bathing/dressing/bathroom   Equipment Recommendations  None recommended by PT    Recommendations for Other Services       Precautions / Restrictions Precautions Precautions: Fall     Mobility  Bed Mobility               General bed mobility comments: in recliner    Transfers Overall transfer level: Needs assistance Equipment used: Rolling walker (2 wheels) Transfers: Sit to/from Stand Sit to Stand: Min guard           General transfer comment: Performed x 5 throughout session    Ambulation/Gait Ambulation/Gait assistance: Min guard Gait Distance (Feet): 100 Feet Assistive device: Rolling walker (2 wheels) Gait Pattern/deviations: Step-to pattern, Decreased stride length Gait velocity: decreased     General Gait Details: min cues for RW proximity   Stairs              Wheelchair Mobility    Modified Rankin (Stroke Patients Only)       Balance Overall balance assessment: Needs assistance Sitting-balance support: No upper extremity supported, Feet supported Sitting balance-Leahy Scale: Good     Standing balance support: During functional activity, Reliant on assistive device for balance, Bilateral upper extremity supported Standing balance-Leahy Scale: Poor Standing balance comment: RW and min guard                            Cognition Arousal/Alertness: Awake/alert Behavior During Therapy: WFL for tasks assessed/performed Overall Cognitive Status: Within Functional Limits for tasks assessed                                          Exercises General Exercises - Lower Extremity Long Arc Quad: AROM, Both, 10 reps, Seated Hip Flexion/Marching: AROM, Both, 10 reps, Seated, Standing (10 seated and 10 standing) Heel Raises: AROM, Both, 10 reps, Standing Other Exercises Other Exercises: All standing with RW and min guard    General Comments General comments (skin integrity, edema, etc.): Pt on RA with sats >96% throughout session.  HR max 110 bpm with walking.  Did note that pt was sweaty on back again in chair - denies lightheadeness , was wrapped in thick blanket      Pertinent Vitals/Pain Pain Assessment Pain Assessment: No/denies pain    Home  Living                          Prior Function            PT Goals (current goals can now be found in the care plan section) Progress towards PT goals: Progressing toward goals    Frequency    Min 3X/week      PT Plan Current plan remains appropriate    Co-evaluation              AM-PAC PT "6 Clicks" Mobility   Outcome Measure  Help needed turning from your back to your side while in a flat bed without using bedrails?: A Little Help needed moving from lying on your back to sitting on the side of a flat bed without using  bedrails?: A Little Help needed moving to and from a bed to a chair (including a wheelchair)?: A Little Help needed standing up from a chair using your arms (e.g., wheelchair or bedside chair)?: A Little Help needed to walk in hospital room?: A Little Help needed climbing 3-5 steps with a railing? : A Little 6 Click Score: 18    End of Session Equipment Utilized During Treatment: Gait belt Activity Tolerance: Patient tolerated treatment well Patient left: in chair;with call bell/phone within reach;with family/visitor present;with chair alarm set Nurse Communication: Mobility status PT Visit Diagnosis: Muscle weakness (generalized) (M62.81);Unsteadiness on feet (R26.81)     Time: [id number] PT Time Calculation (min) (ACUTE ONLY): 18 min  Charges:  $Gait Training: 8-22 mins                     Abran Richard, PT Acute Rehab [facility] Rehab [phone]    Karlton Lemon 01/24/2023, 12:27 PM

## 2023-01-24 NOTE — Discharge Summary (Signed)
Physician Discharge Summary  [name] [id number] DOB: [birth year] DOA: 01/21/2023  PCP: Corliss Blacker, MD  Admit date: 01/21/2023 Discharge date: 01/24/2023  Time spent: 60 minutes  Recommendations for Outpatient Follow-up:  Follow-up with Corliss Blacker, MD in 2 weeks.  On follow-up patient will need a basic metabolic profile done to follow-up on electrolytes and renal function.  Patient's diabetes will need to be followed up upon.  Patient will need nonemergent outpatient contrast-enhanced MRI to follow-up on renal lesion noted on CT chest.  Patient has OSA/nocturnal desats will need to be followed up upon as patient being discharged home on home O2 nocturnally only. Follow-up with Dr. Carlean Purl, GI in 3 weeks.   Discharge Diagnoses:  Principal Problem:   Acute hypoxic respiratory failure (HCC) Active Problems:   Diarrhea   Persistent atrial fibrillation (HCC)   DMII (diabetes mellitus, type 2) (HCC)   Post-surgical hypothyroidism   Hyperlipidemia associated with type 2 diabetes mellitus ([place])   Essential hypertension   Depression with anxiety   Chronic diastolic CHF (congestive heart failure) (HCC)   Tachypnea   Malnutrition of moderate degree   Discharge Condition: Stable and improved.  Diet recommendation: Regular  Filed Weights   01/21/23 1028  Weight: 59.9 kg    History of present illness:  HPI per Dr. Karle Barr I Geitner is a 89 y.o. female with medical history significant of afib on Eliquis; breast cancer; DM;  hypothyroidism; GERD; glaucoma; HTN; HLD; and OSA on CPAP presenting with SOB.  She was last admitted from 2/28-3/5 for colitis, acute on chronic diastolic CHF, and afib.  She was initially treated with IV diuretics and was periodically hypoxic (thought to be from CHF) but diuretics were stopped due to concern that they were causing/contributing to diarrhea.  She underwent colonoscopy on 3/4 without significant findings, biopsies negative  for microscopic colitis.  Echo was also unremarkable on 3/1, prior echo in 07/2020 with grade 1 diastolic dysfunction.  She felt good when she left the hospital, is not sure when she started feeling differently.  She started with SOB about 4-5 hours ago.  She has had home O2 "for a few minutes" in the past (?).  Slight wheeze.  No coughing.  Maybe a low fever. No sick contacts.  +nausea, no diarrhea but loose stools. No urinary symptoms.   She is on prednisone - GI thought she might be a bacterial infection.  Her daughter thinks it is Creon-related or maybe it is related to lactose intolerance.  She is doing better, going to finish prednisone and is tapering (3 this AM).  Yesterday, she laid in the bed all day and didn't feel well.  Today, she was panting and had difficulty breathing.  Question about Lasix.  Never a smoker, no h/o lung disease.  She is starting to have some memory issues, had sundowning about a month ago but not during her last hospitalization.  She has never been on home O2.  She does want full resuscitation.         ER Course:  Recently discharged, doesn't look like she was discharged with diuretics.  This AM, sat in 70s and patient felt SOB.  Here with tachypnea 20-30, RLE edema.  CTPE negative, pleural effusion better.  ?cause for respiratory issue.  Unremarkable work-up.  Hypoxic again while in the ER to 83%.  Hospital Course:  #1 acute hypoxic respiratory failure---resolved -Patient had presented with acute hypoxic respiratory failure noted in the ED to have  sats of 83%. -Chest x-ray done with no acute abnormality noted. -CT angiogram chest done negative for PE, small right pleural effusion, unchanged indeterminate exophytic posterior left upper lobe 1.1 cm renal lesion.  Nonemergent contrast-enhanced MRI for characterization of the acute symptoms have resolved.  Aortic atherosclerosis.  Coronary artery calcifications. -2D echo done with EF of 65 to 70%,NWMA.  Moderate concentric  LVH.  Right ventricular systolic function moderately reduced.  Right ventricular size mildly enlarged.  Mildly elevated pulmonary artery systolic pressure.  Moderately dilated left atrial size.  Mild to moderately dilated right atrium size.  Degenerative mitral valve.  Trivial MVR.  No evidence of mitral stenosis.  Moderate mitral annular calcification.  Mild AVR.  No aortic stenosis is present.  Agitated saline contrast bubble study was negative with no evidence of intra-arterial shunt. -Patient improved clinically during the hospitalization and felt patient symptoms were secondary to anxiety/panic attacks. -Patient seen by pulmonary, patient's hypoxemia noted to have resolved. -It was felt per pulmonary that patient's symptoms could be secondary to a panic attack, pulmonary noted patient wears oxygen nightly for OSA I recommended continuation of days however in speaking with patient and caretaker they adamantly refused that patient was on home O2. -Nocturnal oxygen done and patient noted to desat while sleeping, discussed with pulmonary was told may have a component of OSA and recommended placing patient on 2 L nasal cannula which she was supposed to be on. -Patient placed empirically on IV antibiotics for UTI and possible aspiration pneumonia.  Patient be discharged on 2 more days of oral antibiotics to complete a 5-day course of empiric treatment. -Patient improved clinically, was back to baseline by day of discharge and will follow-up with PCP in the outpatient setting.  2.  Lactic acidosis -??  Etiology felt may be secondary to hypoxia which resolved with IV fluids.  3.  UTI -Urine is done concerning for UTI. -Urine cultures with no growth. -Status post 3 days IV Rocephin during the hospitalization.  4.  Diarrhea -Patient seen in consultation by GI who recommended continuation of prior prednisone taper for possible microscopic colitis partially treated with steroids. -GI recommended  discontinuation of Creon and patient started on Lactaid which patient be discharged on. -Outpatient follow-up with primary gastroenterologist in 3 weeks.  5.  Renal lesion -Noted on CT angiogram chest. -Outpatient nonemergent contrast-enhanced MRI recommended once patient is resolved from acute issues. -Patient follow-up with PCP.  6.  Hypothyroidism -Patient maintained on home regimen Synthroid.  7.  Paroxysmal atrial fibrillation -Patient maintained on home regimen metoprolol and Cardizem for rate control, Eliquis for anticoagulation.  8.  Chronic diastolic CHF -Remained compensated during the hospitalization. -Patient maintained on the cardiac regimen of metoprolol, Cardizem.  9.  Depression -Remained stable. -Patient maintained on home regimen Zoloft.  10.  Diabetes mellitus type 2 -Not on any home medications prior to admission. -Hemoglobin A1c noted at 5.9 (12/12/2022). -Outpatient follow-up with PCP.  11.  Hypokalemia -Repleted. -Outpatient follow-up with PCP.  Procedures: CT angiogram chest 01/21/2023 Chest x-ray 01/21/2023 2D echo 01/22/2023 Lower extremity Dopplers 01/21/2023   Consultations: Pulmonary: Dr.Meier 01/21/2023 Gastroenterology: Dr.Danis III 01/22/2023  Discharge Exam: Vitals:   01/24/23 0445 01/24/23 1258  BP: 136/61 94/60  Pulse: 82 86  Resp: 18 (!) 30  Temp: 98 F (36.7 C) (!) 97.5 F (36.4 C)  SpO2: 93% 99%    General: NAD Cardiovascular: Regular rate rhythm no murmurs rubs or gallops.  No JVD.  No lower extremity edema. Respiratory: Clear  to auscultation bilaterally.  No wheezes, no crackles, no rhonchi.  Discharge Instructions   Discharge Instructions     Diet general   Complete by: As directed    Increase activity slowly   Complete by: As directed       Allergies as of 01/24/2023       Reactions   Penicillins Itching, Rash, Other (See Comments)   Did it involve swelling of the face/tongue/throat, SOB, or low BP? No Did it  involve sudden or severe rash/hives, skin peeling, or any reaction on the inside of your mouth or nose? No Did you need to seek medical attention at a hospital or doctor's office? No When did it last happen?    30+ years   If all above answers are "NO", may proceed with cephalosporin use.        Medication List     STOP taking these medications    loperamide 2 MG capsule Commonly known as: IMODIUM       TAKE these medications    Accu-Chek Guide test strip Generic drug: glucose blood   acetaminophen 650 MG CR tablet Commonly known as: TYLENOL Take 650 mg by mouth in the morning and at bedtime.   apixaban 5 MG Tabs tablet Commonly known as: Eliquis Take 1 tablet (5 mg total) by mouth 2 (two) times daily.   cefdinir 300 MG capsule Commonly known as: OMNICEF Take 1 capsule (300 mg total) by mouth every 12 (twelve) hours for 2 days. Start taking on: January 25, 2023   cholecalciferol 25 MCG (1000 UT) tablet Generic drug: Cholecalciferol Take 1,000 Units by mouth daily.   diltiazem 360 MG 24 hr capsule Commonly known as: CARDIZEM CD Take 1 capsule (360 mg total) by mouth daily.   ferrous sulfate 325 (65 FE) MG tablet Take 325 mg by mouth daily with breakfast.   lactase 3000 units tablet Commonly known as: LACTAID Take 2 tablets (6,000 Units total) by mouth 3 (three) times daily with meals.   levothyroxine 150 MCG tablet Commonly known as: SYNTHROID Take 150 mcg by mouth every morning.   LORazepam 1 MG tablet Commonly known as: ATIVAN Take 0.5 mg by mouth at bedtime. Take additional 0.5 mg if needed   metoprolol tartrate 100 MG tablet Commonly known as: LOPRESSOR TAKE 1 TABLET(100 MG) BY MOUTH TWICE DAILY What changed: See the new instructions.   pantoprazole 40 MG tablet Commonly known as: PROTONIX Take 1 tablet (40 mg total) by mouth daily.   Polyvinyl Alcohol-Povidone PF 1.4-0.6 % Soln Place 1 drop into both eyes daily as needed (dry eyes).    potassium chloride 10 MEQ tablet Commonly known as: KLOR-CON Take 10 mEq by mouth daily.   predniSONE 10 MG tablet Commonly known as: DELTASONE Take 4 tablets (40 mg total) by mouth daily with breakfast for 2 days, THEN 3.5 tablets (35 mg total) daily with breakfast for 7 days, THEN 3 tablets (30 mg total) daily with breakfast for 7 days, THEN 2.5 tablets (25 mg total) daily with breakfast for 7 days, THEN 2 tablets (20 mg total) daily with breakfast for 30 days, THEN 1.5 tablets (15 mg total) daily with breakfast for 7 days, THEN 1 tablet (10 mg total) daily with breakfast for 7 days, THEN 1 tablet (10 mg total) daily with breakfast for 7 days, THEN 0.5 tablets (5 mg total) daily with breakfast for 7 days. Start taking on: January 17, 2023 What changed: Another medication with the same name was removed.  Continue taking this medication, and follow the directions you see here.   rosuvastatin 10 MG tablet Commonly known as: CRESTOR Take 10 mg by mouth at bedtime.   saccharomyces boulardii 250 MG capsule Commonly known as: FLORASTOR Take 1 capsule (250 mg total) by mouth 2 (two) times daily.   sertraline 50 MG tablet Commonly known as: ZOLOFT Take 50 mg by mouth daily.   vitamin B-12 500 MCG tablet Commonly known as: CYANOCOBALAMIN Take 500 mcg by mouth daily.   zinc gluconate 50 MG tablet Take 50 mg by mouth daily.               Durable Medical Equipment  (From admission, onward)           Start     Ordered   01/24/23 1441  For home use only DME oxygen  Once       Question Answer Comment  Length of Need Lifetime   Mode or (Route) Nasal cannula   Liters per Minute 2   Frequency Only at night (stationary unit needed)   Oxygen conserving device Yes   Oxygen delivery system Gas      01/24/23 1440           Allergies  Allergen Reactions   Penicillins Itching, Rash and Other (See Comments)    Did it involve swelling of the face/tongue/throat, SOB, or low BP?  No Did it involve sudden or severe rash/hives, skin peeling, or any reaction on the inside of your mouth or nose? No Did you need to seek medical attention at a hospital or doctor's office? No When did it last happen?    30+ years   If all above answers are "NO", may proceed with cephalosporin use.     Follow-up Information     Corliss Blacker, MD. Schedule an appointment as soon as possible for a visit in 2 week(s).   Specialty: Internal Medicine Contact information: [address] [place] [address] [place] [address] [phone]         Gatha Mayer, MD. Schedule an appointment as soon as possible for a visit in 3 week(s).   Specialty: Gastroenterology Why: Follow-up in 3 weeks or as previously scheduled. Contact information: [address] [place] [address] [phone]                  The results of significant diagnostics from this hospitalization (including imaging, microbiology, ancillary and laboratory) are listed below for reference.    Significant Diagnostic Studies: ECHOCARDIOGRAM LIMITED BUBBLE STUDY  Result Date: 01/22/2023    ECHOCARDIOGRAM LIMITED REPORT   Patient Name:   SHATORIA GODLESKI Date of Exam: 01/22/2023 Medical Rec #:  [id number]        Height:       65.0 in Accession #:    [id number]       Weight:       132.0 lb Date of Birth:  [birth year]         BSA:          1.658 m Patient Age:    62 years         BP:           124/85 mmHg Patient Gender: F                HR:           107 bpm. Exam Location:  Inpatient Procedure: Limited Echo, Cardiac Doppler, Color Doppler and Saline Contrast  Bubble Study Indications:    hypoxia  History:        Patient has prior history of Echocardiogram examinations.                 Cardiomegaly, Arrythmias:Atrial Fibrillation; Risk                 Factors:Hypertension and Diabetes.  Sonographer:    Phineas Douglas Referring Phys: [phone] BELKYS A REGALADO IMPRESSIONS  1. Left ventricular ejection fraction,  by estimation, is 65 to 70%. The left ventricle has normal function. The left ventricle has no regional wall motion abnormalities. There is moderate concentric left ventricular hypertrophy.  2. Right ventricular systolic function is moderately reduced. The right ventricular size is mildly enlarged. There is mildly elevated pulmonary artery systolic pressure.  3. Left atrial size was moderately dilated.  4. Right atrial size was mild to moderately dilated.  5. The mitral valve is degenerative. Trivial mitral valve regurgitation. No evidence of mitral stenosis. Moderate mitral annular calcification.  6. The aortic valve is tricuspid. Aortic valve regurgitation is mild. No aortic stenosis is present. Aortic regurgitation PHT measures 425 msec.  7. The inferior vena cava is dilated in size with >50% respiratory variability, suggesting right atrial pressure of 8 mmHg.  8. Agitated saline contrast bubble study was negative, with no evidence of any interatrial shunt. FINDINGS  Left Ventricle: Left ventricular ejection fraction, by estimation, is 65 to 70%. The left ventricle has normal function. The left ventricle has no regional wall motion abnormalities. The left ventricular internal cavity size was normal in size. There is  moderate concentric left ventricular hypertrophy. Right Ventricle: The right ventricular size is mildly enlarged. No increase in right ventricular wall thickness. Right ventricular systolic function is moderately reduced. There is mildly elevated pulmonary artery systolic pressure. The tricuspid regurgitant velocity is 2.91 m/s, and with an assumed right atrial pressure of 8 mmHg, the estimated right ventricular systolic pressure is [id number] mmHg. Left Atrium: Left atrial size was moderately dilated. Right Atrium: Right atrial size was mild to moderately dilated. Pericardium: There is no evidence of pericardial effusion. Mitral Valve: The mitral valve is degenerative in appearance. Moderate mitral  annular calcification. Trivial mitral valve regurgitation. No evidence of mitral valve stenosis. Tricuspid Valve: The tricuspid valve is normal in structure. Tricuspid valve regurgitation is mild . No evidence of tricuspid stenosis. Aortic Valve: The aortic valve is tricuspid. Aortic valve regurgitation is mild. Aortic regurgitation PHT measures 425 msec. No aortic stenosis is present. Pulmonic Valve: The pulmonic valve was normal in structure. Pulmonic valve regurgitation is not visualized. No evidence of pulmonic stenosis. Aorta: The aortic root is normal in size and structure. Venous: The inferior vena cava is dilated in size with greater than 50% respiratory variability, suggesting right atrial pressure of 8 mmHg. IAS/Shunts: No atrial level shunt detected by color flow Doppler. Agitated saline contrast was given intravenously to evaluate for intracardiac shunting. Agitated saline contrast bubble study was negative, with no evidence of any interatrial shunt. LEFT VENTRICLE PLAX 2D LVIDd:         3.30 cm LVIDs:         2.10 cm LV PW:         1.40 cm LV IVS:        1.60 cm  LV Volumes (MOD) LV vol d, MOD A2C: 77.2 ml LV vol d, MOD A4C: 77.9 ml LV vol s, MOD A2C: 23.7 ml LV vol s, MOD A4C: 24.7 ml LV SV  MOD A2C:     53.5 ml LV SV MOD A4C:     77.9 ml LV SV MOD BP:      52.6 ml IVC IVC diam: 2.30 cm AORTIC VALVE AI PHT:      425 msec TRICUSPID VALVE TR Peak grad:   33.9 mmHg TR Vmax:        291.00 cm/s Glori Bickers MD Electronically signed by Glori Bickers MD Signature Date/Time: 01/22/2023/10:02:31 AM    Final    VAS [place] LOWER EXTREMITY VENOUS (DVT) (7a-7p)  Result Date: 01/21/2023  Lower Venous DVT Study Patient Name:  ARDYTH TERHORST  Date of Exam:   01/21/2023 Medical Rec #: [id number]         Accession #:    [id number] Date of Birth: [birth year]          Patient Gender: F Patient Age:   23 years Exam Location:  [facility] Procedure:      VAS [place] LOWER EXTREMITY VENOUS (DVT) Referring Phys: HAYLEY  NAASZ --------------------------------------------------------------------------------  Indications: Swelling.  Risk Factors: None identified. Limitations: Poor ultrasound/tissue interface. Comparison Study: No prior studies. Performing Technologist: Oliver Hum RVT  Examination Guidelines: A complete evaluation includes B-mode imaging, spectral Doppler, color Doppler, and power Doppler as needed of all accessible portions of each vessel. Bilateral testing is considered an integral part of a complete examination. Limited examinations for reoccurring indications may be performed as noted. The reflux portion of the exam is performed with the patient in reverse Trendelenburg.  +---------+---------------+---------+-----------+----------+--------------+ RIGHT    CompressibilityPhasicitySpontaneityPropertiesThrombus Aging +---------+---------------+---------+-----------+----------+--------------+ CFV      Full           Yes      Yes                                 +---------+---------------+---------+-----------+----------+--------------+ SFJ      Full                                                        +---------+---------------+---------+-----------+----------+--------------+ FV Prox  Full                                                        +---------+---------------+---------+-----------+----------+--------------+ FV Mid   Full                                                        +---------+---------------+---------+-----------+----------+--------------+ FV DistalFull                                                        +---------+---------------+---------+-----------+----------+--------------+ PFV      Full                                                        +---------+---------------+---------+-----------+----------+--------------+  POP      Full           Yes      Yes                                  +---------+---------------+---------+-----------+----------+--------------+ PTV      Full                                                        +---------+---------------+---------+-----------+----------+--------------+ PERO     Full                                                        +---------+---------------+---------+-----------+----------+--------------+   +----+---------------+---------+-----------+----------+--------------+ LEFTCompressibilityPhasicitySpontaneityPropertiesThrombus Aging +----+---------------+---------+-----------+----------+--------------+ CFV Full           Yes      Yes                                 +----+---------------+---------+-----------+----------+--------------+     Summary: RIGHT: - There is no evidence of deep vein thrombosis in the lower extremity.  - No cystic structure found in the popliteal fossa.  LEFT: - No evidence of common femoral vein obstruction.  *See table(s) above for measurements and observations. Electronically signed by Jamelle Haring on 01/21/2023 at 4:34:48 PM.    Final    CT Angio Chest PE W and/or Wo Contrast  Result Date: 01/21/2023 CLINICAL DATA:  2 day history of cough and shortness of breath EXAM: CT ANGIOGRAPHY CHEST WITH CONTRAST TECHNIQUE: Multidetector CT imaging of the chest was performed using the standard protocol during bolus administration of intravenous contrast. Multiplanar CT image reconstructions and MIPs were obtained to evaluate the vascular anatomy. RADIATION DOSE REDUCTION: This exam was performed according to the departmental dose-optimization program which includes automated exposure control, adjustment of the mA and/or kV according to patient size and/or use of iterative reconstruction technique. CONTRAST:  76m OMNIPAQUE IOHEXOL 350 MG/ML SOLN COMPARISON:  Chest radiograph dated 01/21/2023, CT abdomen and pelvis dated 01/10/2023, CT chest dated 02/05/2017 FINDINGS: Cardiovascular: The study is high  quality for the evaluation of pulmonary embolism. There are no filling defects in the central, lobar, segmental or subsegmental pulmonary artery branches to suggest acute pulmonary embolism. Pulmonary artery measures 3.9 cm. Multichamber cardiomegaly. No significant pericardial fluid/thickening. Coronary artery calcifications and aortic atherosclerosis. Mediastinum/Nodes: Surgically absent thyroid gland. Patulous esophagus with small hiatal hernia. No pathologically enlarged axillary, supraclavicular, mediastinal, or hilar lymph nodes. Lungs/Pleura: The central airways are patent. No focal consolidation. No pneumothorax. Small right pleural effusion. Upper abdomen: Unchanged indeterminate exophytic posterior left upper pole 1.1 cm lesion (4:85). Musculoskeletal: No acute or abnormal lytic or blastic osseous lesions. Review of the MIP images confirms the above findings. IMPRESSION: 1. No acute pulmonary embolism. 2. Small right pleural effusion. 3. Unchanged indeterminate exophytic posterior left upper pole 1.1 cm renal lesion. Recommend nonemergent contrast-enhanced MRI for characterization after acute symptoms have resolved. 4. Aortic Atherosclerosis (ICD10-I70.0). Coronary artery calcifications. Assessment for potential risk factor modification, dietary therapy or pharmacologic therapy may be warranted, if  clinically indicated. Electronically Signed   By: Darrin Nipper M.D.   On: 01/21/2023 14:25   DG Chest 2 View  Result Date: 01/21/2023 CLINICAL DATA:  89 year old female with shortness of breath, chest tightness. EXAM: CHEST - 2 VIEW COMPARISON:  Portable chest 12/22/2022 and earlier. FINDINGS: Semi upright AP and lateral views the chest 1203 hours. Regressed/resolved bilateral pleural effusions since last month. Stable cardiomegaly and mediastinal contours. Pulmonary vascularity also decreased. No pneumothorax, pulmonary edema or consolidation. Right axillary surgical clips. No acute osseous abnormality  identified. Negative visible bowel gas. IMPRESSION: Chronic cardiomegaly.  No acute cardiopulmonary abnormality. Electronically Signed   By: Genevie Ann M.D.   On: 01/21/2023 12:14   ECHOCARDIOGRAM COMPLETE  Result Date: 01/12/2023    ECHOCARDIOGRAM REPORT   Patient Name:   GIULIA BERMINGHAM Date of Exam: 01/12/2023 Medical Rec #:  [id number]        Height:       65.0 in Accession #:    [id number]       Weight:       150.0 lb Date of Birth:  [birth year]         BSA:          1.750 m Patient Age:    46 years         BP:           123/81 mmHg Patient Gender: F                HR:           102 bpm. Exam Location:  Inpatient Procedure: 2D Echo, Color Doppler and Cardiac Doppler Indications:    Congestive Heart Failure I50.9  History:        Patient has prior history of Echocardiogram examinations, most                 recent 07/21/2020. Arrythmias:Atrial Fibrillation,                 Signs/Symptoms:Murmur; Risk Factors:Non-Smoker, Hypertension,                 Sleep Apnea and Diabetes.  Sonographer:    Greer Pickerel Referring Phys: [phone] [facility]  Sonographer Comments: No subcostal window. Image acquisition challenging due to patient body habitus and Image acquisition challenging due to respiratory motion. IMPRESSIONS  1. Left ventricular ejection fraction, by estimation, is 65%. The left ventricle has normal function. Left ventricular endocardial border not optimally defined to evaluate regional wall motion. There is moderate left ventricular hypertrophy of the basal-septal segment. Left ventricular diastolic parameters are indeterminate.  2. Right ventricular systolic function is normal. The right ventricular size is normal.  3. Left atrial size was severely dilated.  4. Right atrial size was mildly dilated.  5. The mitral valve is degenerative. Mild mitral valve regurgitation. Moderate mitral annular calcification.  6. Tricuspid valve regurgitation is mild to moderate.  7. The aortic valve is tricuspid. Aortic valve  regurgitation is mild.  8. Aortic dilatation noted. There is mild dilatation of the aortic root, measuring 40 mm. FINDINGS  Left Ventricle: Left ventricular ejection fraction, by estimation, is 65%. The left ventricle has normal function. Left ventricular endocardial border not optimally defined to evaluate regional wall motion. The left ventricular internal cavity size was normal in size. There is moderate left ventricular hypertrophy of the basal-septal segment. Left ventricular diastolic parameters are indeterminate. Right Ventricle: The right ventricular size is normal. No increase in right ventricular wall thickness.  Right ventricular systolic function is normal. Left Atrium: Left atrial size was severely dilated. Right Atrium: Right atrial size was mildly dilated. Pericardium: There is no evidence of pericardial effusion. Mitral Valve: The mitral valve is degenerative in appearance. Moderate mitral annular calcification. Mild mitral valve regurgitation. Tricuspid Valve: The tricuspid valve is grossly normal. Tricuspid valve regurgitation is mild to moderate. No evidence of tricuspid stenosis. Aortic Valve: The aortic valve is tricuspid. Aortic valve regurgitation is mild. Aortic regurgitation PHT measures 545 msec. Pulmonic Valve: The pulmonic valve was not well visualized. Pulmonic valve regurgitation is trivial. No evidence of pulmonic stenosis. Aorta: Aortic dilatation noted. There is mild dilatation of the aortic root, measuring 40 mm. IAS/Shunts: The interatrial septum was not well visualized.  LEFT VENTRICLE PLAX 2D LVIDd:         4.10 cm   Diastology LVIDs:         2.70 cm   LV e' medial:    5.78 cm/s LV PW:         1.00 cm   LV E/e' medial:  22.1 LV IVS:        1.10 cm   LV e' lateral:   6.83 cm/s LVOT diam:     1.60 cm   LV E/e' lateral: 18.7 LV SV:         23 LV SV Index:   13 LVOT Area:     2.01 cm  LEFT ATRIUM              Index        RIGHT ATRIUM           Index LA diam:        4.30 cm  2.46  cm/m   RA Area:     25.20 cm LA Vol (A2C):   120.0 ml 68.55 ml/m  RA Volume:   70.00 ml  39.99 ml/m LA Vol (A4C):   148.0 ml 84.55 ml/m LA Biplane Vol: 147.0 ml 83.98 ml/m  AORTIC VALVE             PULMONIC VALVE LVOT Vmax:   66.60 cm/s  PR End Diast Vel: 2.29 msec LVOT Vmean:  55.100 cm/s LVOT VTI:    0.115 m AI PHT:      545 msec  AORTA Ao Root diam: 4.00 cm MITRAL VALVE                TRICUSPID VALVE MV Area (PHT): 5.62 cm     TR Peak grad:   30.7 mmHg MV Decel Time: 135 msec     TR Vmax:        277.00 cm/s MR Peak grad: 89.1 mmHg MR Vmax:      472.00 cm/s   SHUNTS MV E velocity: 128.00 cm/s  Systemic VTI:  0.12 m                             Systemic Diam: 1.60 cm Rudean Haskell MD Electronically signed by Rudean Haskell MD Signature Date/Time: 01/12/2023/2:27:19 PM    Final    DG CHEST PORT 1 VIEW  Result Date: 01/11/2023 CLINICAL DATA:  Pleural effusion EXAM: PORTABLE CHEST 1 VIEW COMPARISON:  12/07/2022 FINDINGS: Prominent cardiac silhouette. Small pleural effusion on the left and moderate pleural effusion right. Pulmonary vascular congestion consistent with pulmonary edema. Mild bibasilar consolidation. Calcified tortuous aorta. Postop changes right axilla. IMPRESSION: Findings consistent with CHF. Moderate pleural effusion on the right  and small pleural effusion on the left. Electronically Signed   By: Sammie Bench M.D.   On: 01/11/2023 09:06   CT ABDOMEN PELVIS W CONTRAST  Result Date: 01/10/2023 CLINICAL DATA:  Bowel obstruction suspected Diarrhea and generalized weakness. EXAM: CT ABDOMEN AND PELVIS WITH CONTRAST TECHNIQUE: Multidetector CT imaging of the abdomen and pelvis was performed using the standard protocol following bolus administration of intravenous contrast. RADIATION DOSE REDUCTION: This exam was performed according to the departmental dose-optimization program which includes automated exposure control, adjustment of the mA and/or kV according to patient size  and/or use of iterative reconstruction technique. CONTRAST:  35m OMNIPAQUE IOHEXOL 300 MG/ML  SOLN COMPARISON:  CT 03/23/2022 FINDINGS: Lower chest: Moderate right and small left pleural effusion. Associated compressive atelectasis. Cardiomegaly with primarily right heart dilatation. Wall thickening of the distal esophagus with small hiatal hernia. Hepatobiliary: Stable small low-density lesions are too small to accurately characterize but likely cysts. No new liver lesions. Layering gallstones in the gallbladder. No pericholecystic inflammation. No biliary dilatation. Pancreas: Mild fatty atrophy.  No ductal dilatation or inflammation. Spleen: Normal in size without focal abnormality. Adrenals/Urinary Tract: No adrenal nodule. Symmetric prominence of both renal collecting systems without ureteral dilatation. There are bilateral simple cysts that need no further follow-up. Indeterminate exophytic lesion from the upper pole of the left kidney measures 12 mm, series 2, image 23, previously 9 mm. Partially distended urinary bladder. Stomach/Bowel: Moderate-sized hiatal hernia with wall thickening of the distal esophagus. Air in fluid within the stomach without gastric wall thickening. No small bowel obstruction or inflammatory change. Minimal fluid within distal small bowel loops in the pelvis. The appendix is not confidently seen. Mild wall thickening of the ascending colon, series 2, image 55. Formed stool in the remainder of the ascending colon. Air-filled transverse colon that is mildly prominent. Mixed liquid and solid stool in the descending colon. Small amount of formed stool in the sigmoid colon. Mild sigmoid colonic diverticulosis without diverticulitis. Rectum is distended with air and a small amount of mixed liquid and solid stool. No obvious colonic mass. Vascular/Lymphatic: Aortic atherosclerosis and tortuosity. No aneurysm. No acute vascular findings. No adenopathy. Reproductive: Hysterectomy. No adnexal  mass. Other: No ascites or free fluid. No free intra-abdominal air. There is generalized subcutaneous body wall edema. Fat in both inguinal canals. Musculoskeletal: Posterior L4-L5 fusion hardware. Degenerative change of both hips and the pubic symphysis. There are no acute or suspicious osseous abnormalities. IMPRESSION: 1. Mild wall thickening of the ascending colon, possible colitis. 2. Moderate-sized hiatal hernia with wall thickening of the distal esophagus, can be seen with reflux or esophagitis. 3. Moderate right and small left pleural effusions with compressive atelectasis. Generalized subcutaneous body wall edema. 4. Cholelithiasis without CT findings of acute cholecystitis. 5. Indeterminate exophytic lesion from the upper pole of the left kidney measures 12 mm, previously 9 mm. Recommend nonemergent renal protocol MRI for characterization after resolution of acute event, preferably as an outpatient when patient can tolerate breath hold technique. 6. Mild sigmoid colonic diverticulosis without diverticulitis. Aortic Atherosclerosis (ICD10-I70.0). Electronically Signed   By: MKeith RakeM.D.   On: 01/10/2023 21:15   Abd 1 View (KUB)  Result Date: 01/10/2023 CLINICAL DATA:  Chronic diarrhea. EXAM: ABDOMEN - 1 VIEW COMPARISON:  None Available. FINDINGS: Generalized gaseous distention of colon is seen without significant small bowel dilatation. Lumbar spine fusion hardware noted. IMPRESSION: Generalized gaseous distention of colon. Differential diagnosis includes colonic ileus and distal colonic obstruction. Consider abdomen pelvis CT for  further evaluation if clinically warranted. Electronically Signed   By: Marlaine Hind M.D.   On: 01/10/2023 19:53    Microbiology: Recent Results (from the past 240 hour(s))  Resp panel by RT-PCR (RSV, Flu A&B, Covid) Anterior Nasal Swab     Status: None   Collection Time: 01/21/23 11:15 AM   Specimen: Anterior Nasal Swab  Result Value Ref Range Status   SARS  Coronavirus 2 by RT PCR NEGATIVE NEGATIVE Final    Comment: (NOTE) SARS-CoV-2 target nucleic acids are NOT DETECTED.  The SARS-CoV-2 RNA is generally detectable in upper respiratory specimens during the acute phase of infection. The lowest concentration of SARS-CoV-2 viral copies this assay can detect is 138 copies/mL. A negative result does not preclude SARS-Cov-2 infection and should not be used as the sole basis for treatment or other patient management decisions. A negative result may occur with  improper specimen collection/handling, submission of specimen other than nasopharyngeal swab, presence of viral mutation(s) within the areas targeted by this assay, and inadequate number of viral copies(<138 copies/mL). A negative result must be combined with clinical observations, patient history, and epidemiological information. The expected result is Negative.  Fact Sheet for Patients:  EntrepreneurPulse.com.au  Fact Sheet for Healthcare Providers:  IncredibleEmployment.be  This test is no t yet approved or cleared by the [place] FDA and  has been authorized for detection and/or diagnosis of SARS-CoV-2 by FDA under an Emergency Use Authorization (EUA). This EUA will remain  in effect (meaning this test can be used) for the duration of the COVID-19 declaration under Section 564(b)(1) of the Act, 21 U.S.C.section 360bbb-3(b)(1), unless the authorization is terminated  or revoked sooner.       Influenza A by PCR NEGATIVE NEGATIVE Final   Influenza B by PCR NEGATIVE NEGATIVE Final    Comment: (NOTE) The Xpert Xpress SARS-CoV-2/FLU/RSV plus assay is intended as an aid in the diagnosis of influenza from Nasopharyngeal swab specimens and should not be used as a sole basis for treatment. Nasal washings and aspirates are unacceptable for Xpert Xpress SARS-CoV-2/FLU/RSV testing.  Fact Sheet for  Patients: EntrepreneurPulse.com.au  Fact Sheet for Healthcare Providers: IncredibleEmployment.be  This test is not yet approved or cleared by the [place] FDA and has been authorized for detection and/or diagnosis of SARS-CoV-2 by FDA under an Emergency Use Authorization (EUA). This EUA will remain in effect (meaning this test can be used) for the duration of the COVID-19 declaration under Section 564(b)(1) of the Act, 21 U.S.C. section 360bbb-3(b)(1), unless the authorization is terminated or revoked.     Resp Syncytial Virus by PCR NEGATIVE NEGATIVE Final    Comment: (NOTE) Fact Sheet for Patients: EntrepreneurPulse.com.au  Fact Sheet for Healthcare Providers: IncredibleEmployment.be  This test is not yet approved or cleared by the [place] FDA and has been authorized for detection and/or diagnosis of SARS-CoV-2 by FDA under an Emergency Use Authorization (EUA). This EUA will remain in effect (meaning this test can be used) for the duration of the COVID-19 declaration under Section 564(b)(1) of the Act, 21 U.S.C. section 360bbb-3(b)(1), unless the authorization is terminated or revoked.  Performed at [facility], [place] [address] [place], [place] [address]   Culture, blood (Routine X 2) w Reflex to ID Panel     Status: None (Preliminary result)   Collection Time: 01/21/23  6:25 PM   Specimen: BLOOD  Result Value Ref Range Status   Specimen Description   Final    BLOOD BLOOD LEFT  HAND Performed at [facility], [place] [address] [place], [place] [address]    Special Requests   Final    BOTTLES DRAWN AEROBIC AND ANAEROBIC Blood Culture adequate volume Performed at [place] [address], [place], [place] [address]    Culture   Final    NO GROWTH 3 DAYS Performed at [facility], [place] [address], [place], [place] [address]     Report Status PENDING  Incomplete  Culture, blood (Routine X 2) w Reflex to ID Panel     Status: None (Preliminary result)   Collection Time: 01/21/23  6:27 PM   Specimen: BLOOD  Result Value Ref Range Status   Specimen Description   Final    BLOOD BLOOD RIGHT HAND Performed at [place] [address] [place], [place] [address]    Special Requests   Final    AEROBIC BOTTLE ONLY Blood Culture adequate volume Performed at [place] [address], [place], [place] [address]    Culture   Final    NO GROWTH 3 DAYS Performed at [facility], [place] [address] [place], [place] [address]    Report Status PENDING  Incomplete  Respiratory (~20 pathogens) panel by PCR     Status: None   Collection Time: 01/21/23  6:46 PM   Specimen: Nasopharyngeal Swab; Respiratory  Result Value Ref Range Status   Adenovirus NOT DETECTED NOT DETECTED Final   Coronavirus 229E NOT DETECTED NOT DETECTED Final    Comment: (NOTE) The Coronavirus on the Respiratory Panel, DOES NOT test for the novel  Coronavirus (2019 nCoV)    Coronavirus HKU1 NOT DETECTED NOT DETECTED Final   Coronavirus NL63 NOT DETECTED NOT DETECTED Final   Coronavirus OC43 NOT DETECTED NOT DETECTED Final   Metapneumovirus NOT DETECTED NOT DETECTED Final   Rhinovirus / Enterovirus NOT DETECTED NOT DETECTED Final   Influenza A NOT DETECTED NOT DETECTED Final   Influenza B NOT DETECTED NOT DETECTED Final   Parainfluenza Virus 1 NOT DETECTED NOT DETECTED Final   Parainfluenza Virus 2 NOT DETECTED NOT DETECTED Final   Parainfluenza Virus 3 NOT DETECTED NOT DETECTED Final   Parainfluenza Virus 4 NOT DETECTED NOT DETECTED Final   Respiratory Syncytial Virus NOT DETECTED NOT DETECTED Final   Bordetella pertussis NOT DETECTED NOT DETECTED Final   Bordetella Parapertussis NOT DETECTED NOT DETECTED Final   Chlamydophila pneumoniae NOT DETECTED NOT DETECTED Final   Mycoplasma pneumoniae NOT  DETECTED NOT DETECTED Final    Comment: Performed at [facility], [place]. [address], [place], [place] [address]  Urine Culture     Status: None   Collection Time: 01/22/23  7:49 AM   Specimen: Urine, Random  Result Value Ref Range Status   Specimen Description   Final    URINE, RANDOM Performed at [place] [address], [place], [place] [address]    Special Requests   Final    NONE Reflexed from [id number] Performed at [facility], [place] [address] [place], [place] [address]    Culture   Final    NO GROWTH Performed at [facility], [place] [address] [place], [place] [address]    Report Status 01/23/2023 FINAL  Final     Labs: Basic Metabolic Panel: Recent Labs  Lab 01/21/23 1228 01/22/23 0759  NA 138 135  K 2.9* 3.5  CL 103 99  CO2 25 29  GLUCOSE 212* 156*  BUN 17 21  CREATININE 0.69 0.57  CALCIUM 7.6* 8.1*   Liver Function Tests: Recent Labs  Lab 01/22/23 0759  AST 20  ALT 42  ALKPHOS 54  BILITOT 1.2  PROT 5.4*  ALBUMIN 2.9*   No results for input(s): "LIPASE", "AMYLASE" in the last 168 hours. No results for input(s): "AMMONIA" in the last 168 hours. CBC: Recent Labs  Lab 01/21/23 1115 01/22/23 0759  WBC 16.9* 10.7*  NEUTROABS 14.6*  --   HGB 13.3 12.0  HCT 40.8 36.7  MCV 99.3 99.2  PLT 196 199   Cardiac Enzymes: No results for input(s): "CKTOTAL", "CKMB", "CKMBINDEX", "TROPONINI" in the last 168 hours. BNP: BNP (last 3 results) Recent Labs    01/11/23 0951 01/21/23 1115  BNP 468.9* 292.1*    ProBNP (last 3 results) No results for input(s): "PROBNP" in the last 8760 hours.  CBG: No results for input(s): "GLUCAP" in the last 168 hours.     Signed:  Irine Seal MD.  [facility] 01/24/2023, 3:17 PM

## 2023-01-24 NOTE — Progress Notes (Signed)
   01/24/23 1258  Assess: MEWS Score  Temp (!) 97.5 F (36.4 C)  BP 94/60  MAP (mmHg) 71  Pulse Rate 86  Resp (!) 30  SpO2 99 %  Assess: MEWS Score  MEWS Temp 0  MEWS Systolic 1  MEWS Pulse 0  MEWS RR 2  MEWS LOC 0  MEWS Score 3  MEWS Score Color Yellow  Assess: if the MEWS score is Yellow or Red  Were vital signs taken at a resting state? Yes  Focused Assessment Change from prior assessment (see assessment flowsheet)  Does the patient meet 2 or more of the SIRS criteria? No  Does the patient have a confirmed or suspected source of infection? Yes  Provider and Rapid Response Notified? Yes (just provider)  MEWS guidelines implemented  Yes, yellow  Treat  MEWS Interventions Considered administering scheduled or prn medications/treatments as ordered  Take Vital Signs  Increase Vital Sign Frequency  Yellow: Q2hr x1, continue Q4hrs until patient remains green for 12hrs  Escalate  MEWS: Escalate Yellow: Discuss with charge nurse and consider notifying provider and/or RRT  Notify: Charge Nurse/RN  Name of Charge Nurse/RN Notified Ardeen Garland, RN  Provider Notification  Provider Name/Title Dr. [place]ville Silos  Date Provider Notified 01/24/23  Time Provider Notified 1346  Method of Notification Page  Notification Reason Other (Comment) (yellow mews)  Provider response No new orders  Date of Provider Response 01/24/23  Time of Provider Response 1347  Assess: SIRS CRITERIA  SIRS Temperature  0  SIRS Pulse 0  SIRS Respirations  1  SIRS WBC 0  SIRS Score Sum  1

## 2023-01-25 ENCOUNTER — Ambulatory Visit: Payer: Medicare PPO | Admitting: Physician Assistant

## 2023-01-26 ENCOUNTER — Telehealth: Payer: Self-pay | Admitting: Cardiovascular Disease

## 2023-01-26 LAB — CULTURE, BLOOD (ROUTINE X 2)
Culture: NO GROWTH
Culture: NO GROWTH
Special Requests: ADEQUATE
Special Requests: ADEQUATE

## 2023-01-26 NOTE — Telephone Encounter (Signed)
Daughter calling to see what would be the parameterher bp should be. She needs to know what they should be concern for. Please advise

## 2023-01-26 NOTE — Telephone Encounter (Signed)
RN Army Melia is calling stating that the pt was sent to the hospital two days ago because the pt's bp levels dropped. Justice Rocher stated that the pt is on 2 liters of oxygen but the pt's bp needs to be checked everyday. Justice Rocher stated that it is a requirement to know what the pt's bp parameters are. Please call her at [phone], Justice Rocher did say it was okay to leave a voicemail on her work phone if needed.

## 2023-01-30 NOTE — Telephone Encounter (Signed)
Patient's dtr Shirlean Mylar [facility]) calling in to ask that we provide BP parameters for patient as patient has chronically low BP. Patient was admitted to ED 01/21/23 for SOB and discharged on 01/24/23. Patient was started on 2L O2 at night and now has in home caregivers. Daughter denies that patient has any complaints of sdizziness, lightheadedness or weakness, states that caregivers have been tracking her BP's and keeping a log, but she does not have this  in front of her. Asked dtr to call us back with log of blood pressures which we can forward to [name]. Also made follow up appointment w/ T. Harriet Pho PA for second week of April per daughter's request. (Daughter is out of town first week of April but wants patient to be seen.) Daughter states she also has appointment with PCP next week.

## 2023-02-04 ENCOUNTER — Other Ambulatory Visit: Payer: Self-pay

## 2023-02-05 ENCOUNTER — Other Ambulatory Visit (HOSPITAL_COMMUNITY): Payer: Self-pay

## 2023-02-09 ENCOUNTER — Other Ambulatory Visit (HOSPITAL_COMMUNITY): Payer: Self-pay

## 2023-02-13 DIAGNOSIS — F329 Major depressive disorder, single episode, unspecified: Secondary | ICD-10-CM | POA: Diagnosis not present

## 2023-02-13 DIAGNOSIS — E039 Hypothyroidism, unspecified: Secondary | ICD-10-CM | POA: Diagnosis not present

## 2023-02-13 DIAGNOSIS — Z961 Presence of intraocular lens: Secondary | ICD-10-CM | POA: Diagnosis not present

## 2023-02-13 DIAGNOSIS — D6869 Other thrombophilia: Secondary | ICD-10-CM | POA: Diagnosis not present

## 2023-02-13 DIAGNOSIS — Z85850 Personal history of malignant neoplasm of thyroid: Secondary | ICD-10-CM | POA: Diagnosis not present

## 2023-02-13 DIAGNOSIS — R32 Unspecified urinary incontinence: Secondary | ICD-10-CM | POA: Diagnosis not present

## 2023-02-13 DIAGNOSIS — G4733 Obstructive sleep apnea (adult) (pediatric): Secondary | ICD-10-CM | POA: Diagnosis not present

## 2023-02-13 DIAGNOSIS — E785 Hyperlipidemia, unspecified: Secondary | ICD-10-CM | POA: Diagnosis not present

## 2023-02-13 DIAGNOSIS — R269 Unspecified abnormalities of gait and mobility: Secondary | ICD-10-CM | POA: Diagnosis not present

## 2023-02-13 DIAGNOSIS — Z8542 Personal history of malignant neoplasm of other parts of uterus: Secondary | ICD-10-CM | POA: Diagnosis not present

## 2023-02-13 DIAGNOSIS — M1990 Unspecified osteoarthritis, unspecified site: Secondary | ICD-10-CM | POA: Diagnosis not present

## 2023-02-13 DIAGNOSIS — Z91199 Patient's noncompliance with other medical treatment and regimen due to unspecified reason: Secondary | ICD-10-CM | POA: Diagnosis not present

## 2023-02-13 DIAGNOSIS — I10 Essential (primary) hypertension: Secondary | ICD-10-CM | POA: Diagnosis not present

## 2023-02-13 DIAGNOSIS — K219 Gastro-esophageal reflux disease without esophagitis: Secondary | ICD-10-CM | POA: Diagnosis not present

## 2023-02-13 DIAGNOSIS — Z5982 Transportation insecurity: Secondary | ICD-10-CM | POA: Diagnosis not present

## 2023-02-21 ENCOUNTER — Ambulatory Visit: Payer: Medicare PPO | Attending: Physician Assistant | Admitting: Physician Assistant

## 2023-02-21 ENCOUNTER — Encounter: Payer: Self-pay | Admitting: Physician Assistant

## 2023-02-21 VITALS — BP 92/58 | HR 92 | Ht 66.5 in | Wt 134.6 lb

## 2023-02-21 DIAGNOSIS — I4821 Permanent atrial fibrillation: Secondary | ICD-10-CM | POA: Diagnosis not present

## 2023-02-21 DIAGNOSIS — I5032 Chronic diastolic (congestive) heart failure: Secondary | ICD-10-CM | POA: Diagnosis not present

## 2023-02-21 MED ORDER — DILTIAZEM HCL ER COATED BEADS 120 MG PO CP24: 120.0000 mg | ORAL_CAPSULE | Freq: Every day | ORAL | 3 refills | Status: DC

## 2023-02-21 NOTE — Patient Instructions (Addendum)
Medication Instructions:  1.Decrease diltiazem (Cardizem CD) to 120 mg daily *If you need a refill on your cardiac medications before your next appointment, please call your pharmacy*  Lab Work: CMET, MAG, CBC, TSH--TODAY If you have labs (blood work) drawn today and your tests are completely normal, you will receive your results only by: MyChart Message (if you have MyChart) OR A paper copy in the mail If you have any lab test that is abnormal or we need to change your treatment, we will call you to review the results.  Follow-Up: At [facility], you and your health needs are our priority.  As part of our continuing mission to provide you with exceptional heart care, we have created designated Provider Care Teams.  These Care Teams include your primary Cardiologist (physician) and Advanced Practice Providers (APPs -  Physician Assistants and Nurse Practitioners) who all work together to provide you with the care you need, when you need it.  Your next appointments: Call and schedule a 2 week follow up appointment with your primary care provider to discuss ordering a chest xray  Schedule a 4 week follow up with the atrial fibrillation clinic  Then 6 month(s)  Provider:   Tonny Bollman, MD    Other Instructions 1.Weigh every morning after using the restroom, before breakfast and call and let us know if you have a weight gain of 2 lbs or more overnight or 5 lbs or more in a week  2.Check your blood pressure daily, one hour after taking your morning medication for 2 weeks, keep a log and send [place] the readings through mychart at the end of the 2 weeks  Heart-Healthy Eating Plan Many factors influence your heart health, including eating and exercise habits. Heart health is also called coronary health. Coronary risk increases with abnormal blood fat (lipid) levels. A heart-healthy eating plan includes limiting unhealthy fats, increasing healthy fats, limiting salt (sodium) intake,  and making other diet and lifestyle changes. What is my plan? Your health care provider may recommend that: You limit your fat intake to _________% or less of your total calories each day. You limit your saturated fat intake to _________% or less of your total calories each day. You limit the amount of cholesterol in your diet to less than _________ mg per day. You limit the amount of sodium in your diet to less than _________ mg per day. What are tips for following this plan? Cooking Cook foods using methods other than frying. Baking, boiling, grilling, and broiling are all good options. Other ways to reduce fat include: Removing the skin from poultry. Removing all visible fats from meats. Steaming vegetables in water or broth. Meal planning  At meals, imagine dividing your plate into fourths: Fill one-half of your plate with vegetables and green salads. Fill one-fourth of your plate with whole grains. Fill one-fourth of your plate with lean protein foods. Eat 2-4 cups of vegetables per day. One cup of vegetables equals 1 cup (91 g) broccoli or cauliflower florets, 2 medium carrots, 1 large bell pepper, 1 large sweet potato, 1 large tomato, 1 medium white potato, 2 cups (150 g) raw leafy greens. Eat 1-2 cups of fruit per day. One cup of fruit equals 1 small apple, 1 large banana, 1 cup (237 g) mixed fruit, 1 large orange,  cup (82 g) dried fruit, 1 cup (240 mL) 100% fruit juice. Eat more foods that contain soluble fiber. Examples include apples, broccoli, carrots, beans, peas, and barley. Aim  to get 25-30 g of fiber per day. Increase your consumption of legumes, nuts, and seeds to 4-5 servings per week. One serving of dried beans or legumes equals  cup (90 g) cooked, 1 serving of nuts is  oz (12 almonds, 24 pistachios, or 7 walnut halves), and 1 serving of seeds equals  oz (8 g). Fats Choose healthy fats more often. Choose monounsaturated and polyunsaturated fats, such as olive and  canola oils, avocado oil, flaxseeds, walnuts, almonds, and seeds. Eat more omega-3 fats. Choose salmon, mackerel, sardines, tuna, flaxseed oil, and ground flaxseeds. Aim to eat fish at least 2 times each week. Check food labels carefully to identify foods with trans fats or high amounts of saturated fat. Limit saturated fats. These are found in animal products, such as meats, butter, and cream. Plant sources of saturated fats include palm oil, palm kernel oil, and coconut oil. Avoid foods with partially hydrogenated oils in them. These contain trans fats. Examples are stick margarine, some tub margarines, cookies, crackers, and other baked goods. Avoid fried foods. General information Eat more home-cooked food and less restaurant, buffet, and fast food. Limit or avoid alcohol. Limit foods that are high in added sugar and simple starches such as foods made using white refined flour (white breads, pastries, sweets). Lose weight if you are overweight. Losing just 5-10% of your body weight can help your overall health and prevent diseases such as diabetes and heart disease. Monitor your sodium intake, especially if you have high blood pressure. Talk with your health care provider about your sodium intake. Try to incorporate more vegetarian meals weekly. What foods should I eat? Fruits All fresh, canned (in natural juice), or frozen fruits. Vegetables Fresh or frozen vegetables (raw, steamed, roasted, or grilled). Green salads. Grains Most grains. Choose whole wheat and whole grains most of the time. Rice and pasta, including brown rice and pastas made with whole wheat. Meats and other proteins Lean, well-trimmed beef, veal, pork, and lamb. Chicken and [place] without skin. All fish and shellfish. Wild duck, rabbit, pheasant, and venison. Egg whites or low-cholesterol egg substitutes. Dried beans, peas, lentils, and tofu. Seeds and most nuts. Dairy Low-fat or nonfat cheeses, including ricotta and  mozzarella. Skim or 1% milk (liquid, powdered, or evaporated). Buttermilk made with low-fat milk. Nonfat or low-fat yogurt. Fats and oils Non-hydrogenated (trans-free) margarines. Vegetable oils, including soybean, sesame, sunflower, olive, avocado, peanut, safflower, corn, canola, and cottonseed. Salad dressings or mayonnaise made with a vegetable oil. Beverages Water (mineral or sparkling). Coffee and tea. Unsweetened ice tea. Diet beverages. Sweets and desserts Sherbet, gelatin, and fruit ice. Small amounts of dark chocolate. Limit all sweets and desserts. Seasonings and condiments All seasonings and condiments. The items listed above may not be a complete list of foods and beverages you can eat. Contact a dietitian for more options. What foods should I avoid? Fruits Canned fruit in heavy syrup. Fruit in cream or butter sauce. Fried fruit. Limit coconut. Vegetables Vegetables cooked in cheese, cream, or butter sauce. Fried vegetables. Grains Breads made with saturated or trans fats, oils, or whole milk. Croissants. Sweet rolls. Donuts. High-fat crackers, such as cheese crackers and chips. Meats and other proteins Fatty meats, such as hot dogs, ribs, sausage, bacon, rib-eye roast or steak. High-fat deli meats, such as salami and bologna. Caviar. Domestic duck and goose. Organ meats, such as liver. Dairy Cream, sour cream, cream cheese, and creamed cottage cheese. Whole-milk cheeses. Whole or 2% milk (liquid, evaporated, or condensed). Whole buttermilk.  Cream sauce or high-fat cheese sauce. Whole-milk yogurt. Fats and oils Meat fat, or shortening. Cocoa butter, hydrogenated oils, palm oil, coconut oil, palm kernel oil. Solid fats and shortenings, including bacon fat, salt pork, lard, and butter. Nondairy cream substitutes. Salad dressings with cheese or sour cream. Beverages Regular sodas and any drinks with added sugar. Sweets and desserts Frosting. Pudding. Cookies. Cakes. Pies. Milk  chocolate or white chocolate. Buttered syrups. Full-fat ice cream or ice cream drinks. The items listed above may not be a complete list of foods and beverages to avoid. Contact a dietitian for more information. Summary Heart-healthy meal planning includes limiting unhealthy fats, increasing healthy fats, limiting salt (sodium) intake and making other diet and lifestyle changes. Lose weight if you are overweight. Losing just 5-10% of your body weight can help your overall health and prevent diseases such as diabetes and heart disease. Focus on eating a balance of foods, including fruits and vegetables, low-fat or nonfat dairy, lean protein, nuts and legumes, whole grains, and heart-healthy oils and fats. This information is not intended to replace advice given to you by your health care provider. Make sure you discuss any questions you have with your health care provider. Document Revised: 12/05/2021 Document Reviewed: 12/05/2021 Elsevier Patient Education  2023 [employer].

## 2023-02-21 NOTE — Progress Notes (Signed)
Office Visit    Patient Name: [name] Date of Encounter: 02/21/2023  PCP:  Joya MartyrSchwartzman, Neil R, MD (Inactive)   [facility]  Cardiologist:  Tonny BollmanMichael Cooper, MD  Advanced Practice Provider:  No care team member to display Electrophysiologist:  None   HPI    [name] is a 89 y.o. female with a history of persistent atrial fibrillation presents today for follow-up visit.  Patient has been seen frequently in the atrial fibrillation clinic over the last several years.  At that time of her evaluation in 2022, was noted to be in persistent atrial fibrillation despite use of Tikosyn.  She had failed amiodarone in the past.  Decision was made to leave her atrial fibrillation and proceed with a strategy of rate control.  Metoprolol was increased and Tikosyn was discontinued.  She was seen in follow-up 06/2022 and at that time she was with a caregiver.  She stated that she felt mostly "sick" and has had issues with nausea and loose stools.  She also states she has felt sad most of the time after losing all of her siblings and being that she was last remaining sibling of [id number].  Denied chest pain, chest pressure, orthopnea, PND, or heart palpitations.  Today, she states that she occasionally is having dizziness and lightheaded tenderness when moving around.  She was having leg numbness back in February as well and her blood pressure was 90/60.  She has tried to fill elevated 20 mL bottle of water 3 times a day to get enough hydration.  She has been to the ER 3 times in the last couple of months for various reasons.  Most recent, she was there for shortness of breath.  She is having some shortness of breath today.  Her most recent CT scan did show a mild pleural effusion.  She was on diuretics at 1 point but we did not start diuretics today due to her blood pressure.  Pulse is slightly elevated at 92 bpm.  97% oxygen saturation.  She was on a round of steroids at 1 point  and felt much better but now is feeling poorly again.  Muscle aches and pain all over.  Could be infectious in nature. We discussed ordering labs for initial workup. Reviewed recent echo with the patient and family. She is in Afib, working on rate control. She was seen by the Afib clinic but does not have any scheduled follow-up.  Reports no chest pain, pressure, or tightness. No edema, orthopnea, PND. Reports no palpitations.    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   Cervical cancer    COLONIC POLYPS, ADENOMATOUS, HX OF 10/13/2010   Qualifier: Diagnosis of  By: Koleen DistanceKowalk CMA (AAMA), Leisha     Dilated cardiomyopathy 08/27/2018   DJD (degenerative joint disease) of knee    EROSIVE ESOPHAGITIS 10/13/2010   Qualifier: Diagnosis of  By: Koleen DistanceKowalk CMA Duncan Dull(AAMA), Hulan SaasLeisha     Essential hypertension 10/13/2010   Qualifier: Diagnosis of  By: Koleen DistanceKowalk CMA (AAMA), Baird CancerLeisha     Exotropia, intermittent 07/09/2012   GERD (gastroesophageal reflux disease)    Glaucoma  Hiatal hernia s/p robotic repair & fundoplication 05/25/2017 10/13/2010   Qualifier: Diagnosis of  By: Koleen Distance CMA (AAMA), Hulan Saas     History of laser assisted in situ keratomileusis 07/25/2012   HYPERCHOLESTEROLEMIA 10/13/2010   Qualifier: Diagnosis of  By: Koleen Distance CMA (AAMA), Leisha     Hypothyroidism, postsurgical    h/o thyroid cancer s/p thyroidectomy   Iron Deficiency Anemia 10/14/2010   Qualifier: Diagnosis of  By: Jarold Motto MD Mervyn Skeeters R    Irritable bowel syndrome 10/13/2010   Qualifier: Diagnosis of  By: Koleen Distance CMA (AAMA), Leisha     Lumbar stenosis with neurogenic claudication 04/20/2016   OSA on CPAP    settings at 10-11    Type  II diabetes mellitus    type II    Past Surgical History:  Procedure Laterality Date   ABDOMINAL HYSTERECTOMY     "partial"   BIOPSY  01/15/2023   Procedure: BIOPSY;  Surgeon: Hilarie Fredrickson, MD;  Location: [facility];  Service: Gastroenterology;;   BREAST BIOPSY Right    BREAST LUMPECTOMY Right    CARDIOVERSION N/A 03/30/2014   Procedure: CARDIOVERSION - BEDSIDE;  Surgeon: Wendall Stade, MD;  Location[facility] OR;  Service: Cardiovascular;  Laterality: N/A;   CARDIOVERSION N/A 03/31/2014   Procedure: CARDIOVERSION  (BEDSIDE) ;  Surgeon: Lewayne Bunting, MD;  Location: [facility] OR;  Service: Cardiovascular;  Laterality: N/A;   CARDIOVERSION N/A 04/27/2014   Procedure: CARDIOVERSION;  Surgeon: Cassell Clement, MD;  Location: [facility] ENDOSCOPY;  Service: Cardiovascular;  Laterality: N/A;   CARDIOVERSION N/A 04/13/2015   Procedure: CARDIOVERSION;  Surgeon: Vesta Mixer, MD;  Location: [facility] ENDOSCOPY;  Service: Cardiovascular;  Laterality: N/A;   CARDIOVERSION N/A 01/08/2017   Procedure: CARDIOVERSION;  Surgeon: Thurmon Fair, MD;  Location: [facility] ENDOSCOPY;  Service: Cardiovascular;  Laterality: N/A;   CARDIOVERSION N/A 09/19/2017   Procedure: CARDIOVERSION;  Surgeon: Wendall Stade, MD;  Location: [facility] ENDOSCOPY;  Service: Cardiovascular;  Laterality: N/A;   CARDIOVERSION N/A 10/25/2017   Procedure: CARDIOVERSION;  Surgeon: Nahser, Deloris Ping, MD;  Location: [facility] ENDOSCOPY;  Service: Cardiovascular;  Laterality: N/A;   CARDIOVERSION N/A 12/27/2018   Procedure: CARDIOVERSION;  Surgeon: Parke Poisson, MD;  Location: [facility] ENDOSCOPY;  Service: Cardiovascular;  Laterality: N/A;   CARDIOVERSION N/A 05/20/2019   Procedure: CARDIOVERSION;  Surgeon: Parke Poisson, MD;  Location: [facility] ENDOSCOPY;  Service: Cardiovascular;  Laterality: N/A;   CARDIOVERSION N/A 12/10/2019   Procedure: CARDIOVERSION;  Surgeon: Chrystie Nose, MD;  Location: [facility] ENDOSCOPY;  Service: Cardiovascular;  Laterality: N/A;   CARDIOVERSION N/A  12/31/2019   Procedure: CARDIOVERSION;  Surgeon: Thurmon Fair, MD;  Location: [facility] ENDOSCOPY;  Service: Cardiovascular;  Laterality: N/A;   CARDIOVERSION N/A 10/21/2020   Procedure: CARDIOVERSION;  Surgeon: Parke Poisson, MD;  Location: [facility] ENDOSCOPY;  Service: Cardiovascular;  Laterality: N/A;   CARDIOVERSION N/A 12/03/2020   Procedure: CARDIOVERSION;  Surgeon: Jake Bathe, MD;  Location: [facility] ENDOSCOPY;  Service: Cardiovascular;  Laterality: N/A;   CARDIOVERSION N/A 07/11/2021   Procedure: CARDIOVERSION;  Surgeon: Chrystie Nose, MD;  Location: [facility] ENDOSCOPY;  Service: Cardiovascular;  Laterality: N/A;   CARPAL TUNNEL RELEASE Right    CATARACT EXTRACTION W/ INTRAOCULAR LENS  IMPLANT, BILATERAL Bilateral    COLONOSCOPY     COLONOSCOPY N/A 01/15/2023   Procedure: COLONOSCOPY;  Surgeon: Hilarie Fredrickson, MD;  Location: [facility] ENDOSCOPY;  Service: Gastroenterology;  Laterality: N/A;   EXCISIONAL HEMORRHOIDECTOMY     INSERTION OF MESH N/A 05/25/2017  Procedure: INSERTION OF MESH;  Surgeon: Axel Filler, MD;  Location: [facility] ORS;  Service: General;  Laterality: N/A;   TEE WITHOUT CARDIOVERSION N/A 03/30/2014   Procedure: TRANSESOPHAGEAL ECHOCARDIOGRAM (TEE);  Surgeon: Wendall Stade, MD;  Location: [facility] ENDOSCOPY;  Service: Cardiovascular;  Laterality: N/A;   THYROIDECTOMY     TONSILLECTOMY      Allergies  Allergies  Allergen Reactions   Penicillins Itching, Rash and Other (See Comments)    Did it involve swelling of the face/tongue/throat, SOB, or low BP? No Did it involve sudden or severe rash/hives, skin peeling, or any reaction on the inside of your mouth or nose? No Did you need to seek medical attention at a hospital or doctor's office? No When did it last happen?    30+ years   If all above answers are "NO", may proceed with cephalosporin use.      EKGs/Labs/Other Studies Reviewed:   The following studies were reviewed today:  Echo 2021:  1. Left ventricular ejection fraction, by  estimation, is 70 to 75%. The  left ventricle has hyperdynamic function. The left ventricle has no  regional wall motion abnormalities. There is moderate left ventricular  hypertrophy. Left ventricular diastolic  parameters are consistent with Grade I diastolic dysfunction (impaired  relaxation).   2. Right ventricular systolic function is normal. The right ventricular  size is normal. There is mildly elevated pulmonary artery systolic  pressure.   3. Left atrial size was moderately dilated.   4. Right atrial size was mildly dilated.   5. The mitral valve is grossly normal. Mild mitral valve regurgitation.  No evidence of mitral stenosis. Severe mitral annular calcification.   6. The aortic valve is tricuspid. There is moderate calcification of the  aortic valve. There is moderate thickening of the aortic valve. Aortic  valve regurgitation is trivial. No aortic stenosis is present. Aortic  regurgitation PHT measures 474 msec.   7. The inferior vena cava is normal in size with greater than 50%  respiratory variability, suggesting right atrial pressure of 3 mmHg.     EKG:  EKG is not ordered today.   Recent Labs: 01/15/2023: Magnesium 2.0 01/21/2023: B Natriuretic Peptide 292.1; TSH 0.975 01/22/2023: ALT 42; BUN 21; Creatinine, Ser 0.57; Hemoglobin 12.0; Platelets 199; Potassium 3.5; Sodium 135  Recent Lipid Panel No results found for: "CHOL", "TRIG", "HDL", "CHOLHDL", "VLDL", "LDLCALC", "LDLDIRECT"  Risk Assessment/Calculations:   CHA2DS2-VASc Score = 5   This indicates a 7.2% annual risk of stroke. The patient's score is based upon: CHF History: 1 HTN History: 1 Diabetes History: 0 Stroke History: 0 Vascular Disease History: 0 Age Score: 2 Gender Score: 1     Home Medications   Current Meds  Medication Sig   ACCU-CHEK GUIDE test strip    acetaminophen (TYLENOL) 650 MG CR tablet Take 650 mg by mouth in the morning and at bedtime.   apixaban (ELIQUIS) 5 MG TABS tablet Take  1 tablet (5 mg total) by mouth 2 (two) times daily.   cholecalciferol (VITAMIN D) 25 MCG (1000 UNIT) tablet Take 1,000 Units by mouth daily.   diltiazem (CARDIZEM CD) 120 MG 24 hr capsule Take 1 capsule (120 mg total) by mouth daily.   ferrous sulfate 325 (65 FE) MG tablet Take 325 mg by mouth daily with breakfast.   lactase (LACTAID) 3000 units tablet Take 2 tablets (6,000 Units total) by mouth 3 (three) times daily with meals.   levothyroxine (SYNTHROID) 150 MCG tablet Take 150 mcg  by mouth every morning.   LORazepam (ATIVAN) 1 MG tablet Take 0.5 mg by mouth at bedtime. Take additional 0.5 mg if needed   metoprolol tartrate (LOPRESSOR) 100 MG tablet TAKE 1 TABLET(100 MG) BY MOUTH TWICE DAILY   pantoprazole (PROTONIX) 40 MG tablet Take 1 tablet (40 mg total) by mouth daily.   Polyvinyl Alcohol-Povidone PF 1.4-0.6 % SOLN Place 1 drop into both eyes daily as needed (dry eyes).    potassium chloride (KLOR-CON) 10 MEQ tablet Take 10 mEq by mouth daily.   predniSONE (DELTASONE) 10 MG tablet Take 4 tablets (40 mg total) by mouth daily with breakfast for 2 days, THEN 3.5 tablets (35 mg total) daily with breakfast for 7 days, THEN 3 tablets (30 mg total) daily with breakfast for 7 days, THEN 2.5 tablets (25 mg total) daily with breakfast for 7 days, THEN 2 tablets (20 mg total) daily with breakfast for 30 days, THEN 1.5 tablets (15 mg total) daily with breakfast for 7 days, THEN 1 tablet (10 mg total) daily with breakfast for 7 days, THEN 1 tablet (10 mg total) daily with breakfast for 7 days, THEN 0.5 tablets (5 mg total) daily with breakfast for 7 days.   rosuvastatin (CRESTOR) 10 MG tablet Take 10 mg by mouth at bedtime.   saccharomyces boulardii (FLORASTOR) 250 MG capsule Take 1 capsule (250 mg total) by mouth 2 (two) times daily.   sertraline (ZOLOFT) 50 MG tablet Take 50 mg by mouth daily.   vitamin B-12 (CYANOCOBALAMIN) 500 MCG tablet Take 500 mcg by mouth daily.   zinc gluconate 50 MG tablet Take 50  mg by mouth daily.   [DISCONTINUED] diltiazem (CARDIZEM CD) 180 MG 24 hr capsule Take by mouth.     Review of Systems      All other systems reviewed and are otherwise negative except as noted above.  Physical Exam    VS:  BP (!) 92/58   Pulse 92   Ht 5' 6.5" (1.689 m)   Wt 134 lb 9.6 oz (61.1 kg)   LMP  (LMP Unknown)   SpO2 97%   BMI 21.40 kg/m  , BMI Body mass index is 21.4 kg/m.  Wt Readings from Last 3 Encounters:  02/21/23 134 lb 9.6 oz (61.1 kg)  01/21/23 132 lb (59.9 kg)  01/16/23 132 lb 0.9 oz (59.9 kg)     GEN: Well nourished, well developed, in no acute distress. HEENT: normal. Neck: Supple, no JVD, carotid bruits, or masses. Cardiac: Irregularly irregular, no murmurs, rubs, or gallops. No clubbing, cyanosis, edema.  Radials/PT 2+ and equal bilaterally.  Respiratory:  Respirations regular and unlabored, clear to auscultation bilaterally. GI: Soft, nontender, nondistended. MS: No deformity or atrophy. Skin: Warm and dry, no rash. Neuro:  Strength and sensation are intact. Psych: Normal affect.  Assessment & Plan    Chronic diastolic heart failure -euvolemic on exam today -Continue current medications which include Lopressor 100 twice daily, Cardizem CD (decreased to 120 mg daily), Crestor 10 mg daily, Eliquis 5 mg twice daily -recent echo reviewed with the patient  Permanent atrial fibrillation -Continue Cardizem, Lopressor, and Eliquis -She is moderately rate controlled with a heart rate of 92 today  3.  Generalized malaise/fatigue -Recently in the hospital for shortness of breath and was started on nighttime oxygen -We ordered labs including CMP, CBC, mag, TSH         Disposition: Follow up 6 months with Tonny Bollman, MD or APP.  Signed, Sharlene Dory, PA-C  02/21/2023, 4:37 PM [facility]

## 2023-02-22 ENCOUNTER — Other Ambulatory Visit: Payer: Self-pay | Admitting: *Deleted

## 2023-02-22 ENCOUNTER — Telehealth: Payer: Self-pay | Admitting: Cardiovascular Disease

## 2023-02-22 DIAGNOSIS — Z79899 Other long term (current) drug therapy: Secondary | ICD-10-CM

## 2023-02-22 LAB — COMPREHENSIVE METABOLIC PANEL
ALT: 35 IU/L — ABNORMAL HIGH (ref 0–32)
AST: 17 IU/L (ref 0–40)
Albumin/Globulin Ratio: 1.8 (ref 1.2–2.2)
Albumin: 3.4 g/dL — ABNORMAL LOW (ref 3.7–4.7)
Alkaline Phosphatase: 133 IU/L — ABNORMAL HIGH (ref 44–121)
BUN/Creatinine Ratio: 20 (ref 12–28)
BUN: 15 mg/dL (ref 8–27)
Bilirubin Total: 0.5 mg/dL (ref 0.0–1.2)
CO2: 26 mmol/L (ref 20–29)
Calcium: 8.6 mg/dL — ABNORMAL LOW (ref 8.7–10.3)
Chloride: 100 mmol/L (ref 96–106)
Creatinine, Ser: 0.76 mg/dL (ref 0.57–1.00)
Globulin, Total: 1.9 g/dL (ref 1.5–4.5)
Glucose: 223 mg/dL — ABNORMAL HIGH (ref 70–99)
Potassium: 2.9 mmol/L — CL (ref 3.5–5.2)
Sodium: 142 mmol/L (ref 134–144)
Total Protein: 5.3 g/dL — ABNORMAL LOW (ref 6.0–8.5)
eGFR: 75 mL/min/{1.73_m2} (ref >59)

## 2023-02-22 LAB — CBC
Hematocrit: 36.4 % (ref 34.0–46.6)
Hemoglobin: 12.3 g/dL (ref 11.1–15.9)
MCH: 32.4 pg (ref 26.6–33.0)
MCHC: 33.8 g/dL (ref 31.5–35.7)
MCV: 96 fL (ref 79–97)
Platelets: 152 10*3/uL (ref 150–450)
RBC: 3.8 x10E6/uL (ref 3.77–5.28)
RDW: 13 % (ref 11.7–15.4)
WBC: 5.3 10*3/uL (ref 3.4–10.8)

## 2023-02-22 LAB — MAGNESIUM: Magnesium: 1.7 mg/dL (ref 1.6–2.3)

## 2023-02-22 LAB — TSH: TSH: 2.29 u[IU]/mL (ref 0.450–4.500)

## 2023-02-22 MED ORDER — POTASSIUM CHLORIDE CRYS ER 20 MEQ PO TBCR: EXTENDED_RELEASE_TABLET | ORAL | 0 refills | Status: DC

## 2023-02-22 NOTE — Telephone Encounter (Signed)
Gerald from [facility] called reporting stat labs, pt potassium is 2.9. Will forward to MD and nurse.

## 2023-02-22 NOTE — Telephone Encounter (Signed)
Emmaline Life I - 02/22/2023  8:27 AM Sharlene Dory, PA-C  Sent: Thu February 22, 2023 11:52 AM  To: [facility] [address] Triage         Message  This was already addressed. thanks

## 2023-02-22 NOTE — Telephone Encounter (Signed)
Spoke with patient and caretaker and provided them with results/recommendations. They stated their understanding and agree with this plan. Patient has 10 meq tablets of potassium on hand but new rx requested so that she will have enough. Caregiver aware that I will send in the 20 meq tablets so that she does not have to take as many tablets.

## 2023-02-22 NOTE — Telephone Encounter (Signed)
Caller reporting out of range results. 

## 2023-02-24 DIAGNOSIS — I5032 Chronic diastolic (congestive) heart failure: Secondary | ICD-10-CM | POA: Diagnosis not present

## 2023-02-24 DIAGNOSIS — I5033 Acute on chronic diastolic (congestive) heart failure: Secondary | ICD-10-CM | POA: Diagnosis not present

## 2023-02-24 DIAGNOSIS — J9601 Acute respiratory failure with hypoxia: Secondary | ICD-10-CM | POA: Diagnosis not present

## 2023-02-26 ENCOUNTER — Other Ambulatory Visit: Payer: Medicare PPO

## 2023-02-27 ENCOUNTER — Ambulatory Visit
Admission: RE | Admit: 2023-02-27 | Discharge: 2023-02-27 | Disposition: A | Discharge status: Home / Self Care | Payer: Medicare PPO | Source: Ambulatory Visit | Attending: Internal Medicine | Admitting: Internal Medicine

## 2023-02-27 ENCOUNTER — Ambulatory Visit: Payer: Medicare PPO | Attending: Physician Assistant

## 2023-02-27 ENCOUNTER — Ambulatory Visit: Payer: Medicare PPO | Admitting: Internal Medicine

## 2023-02-27 ENCOUNTER — Other Ambulatory Visit: Payer: Self-pay | Admitting: Internal Medicine

## 2023-02-27 ENCOUNTER — Encounter: Payer: Self-pay | Admitting: Internal Medicine

## 2023-02-27 VITALS — BP 94/60 | HR 64 | Ht 65.0 in | Wt 136.0 lb

## 2023-02-27 DIAGNOSIS — Z79899 Other long term (current) drug therapy: Secondary | ICD-10-CM | POA: Diagnosis not present

## 2023-02-27 DIAGNOSIS — R197 Diarrhea, unspecified: Secondary | ICD-10-CM

## 2023-02-27 DIAGNOSIS — R630 Anorexia: Secondary | ICD-10-CM

## 2023-02-27 DIAGNOSIS — I700 Atherosclerosis of aorta: Secondary | ICD-10-CM | POA: Diagnosis not present

## 2023-02-27 DIAGNOSIS — M810 Age-related osteoporosis without current pathological fracture: Secondary | ICD-10-CM | POA: Diagnosis not present

## 2023-02-27 DIAGNOSIS — K529 Noninfective gastroenteritis and colitis, unspecified: Secondary | ICD-10-CM

## 2023-02-27 DIAGNOSIS — K219 Gastro-esophageal reflux disease without esophagitis: Secondary | ICD-10-CM | POA: Diagnosis not present

## 2023-02-27 DIAGNOSIS — E1142 Type 2 diabetes mellitus with diabetic polyneuropathy: Secondary | ICD-10-CM | POA: Diagnosis not present

## 2023-02-27 DIAGNOSIS — E7800 Pure hypercholesterolemia, unspecified: Secondary | ICD-10-CM | POA: Diagnosis not present

## 2023-02-27 DIAGNOSIS — I5032 Chronic diastolic (congestive) heart failure: Secondary | ICD-10-CM | POA: Diagnosis not present

## 2023-02-27 DIAGNOSIS — I4891 Unspecified atrial fibrillation: Secondary | ICD-10-CM | POA: Diagnosis not present

## 2023-02-27 DIAGNOSIS — K2270 Barrett's esophagus without dysplasia: Secondary | ICD-10-CM | POA: Diagnosis not present

## 2023-02-27 DIAGNOSIS — J90 Pleural effusion, not elsewhere classified: Secondary | ICD-10-CM | POA: Diagnosis not present

## 2023-02-27 DIAGNOSIS — F419 Anxiety disorder, unspecified: Secondary | ICD-10-CM | POA: Diagnosis not present

## 2023-02-27 DIAGNOSIS — R0602 Shortness of breath: Secondary | ICD-10-CM

## 2023-02-27 NOTE — Progress Notes (Signed)
[name] 89 y.o. [birth year] [id number]  Assessment & Plan:   Encounter Diagnoses  Name Primary?   Watery diarrhea Yes   Inflammatory bowel disease suspected    Anorexia    She is much improved.  Though random colon biopsies did not reveal any colitis given the clinical scenario with the elevated calprotectin negative infectious evaluation and response to prednisone I am still suspicious of some sort of inflammatory bowel disease.  It may have been small bowel and not colon.   We discussed possibly rechecking calprotectin as a guide but they are not inclined to do so.  I was asked about prescribing prednisone to help appetite and declined, we will hold that in reserve for recurrent diarrhea problems.  We have decided to observe at this point and she will see me back in 2 months.  Daughter and caretaker and patient know to contact me if diarrhea becomes a problem again.  I explained how the abnormal fecal elastase test was erroneous because of the watery diarrhea, again.  Patient is fixated on resuming Creon which never helped her anyway.  She had an indeterminate renal lesion on CT scanning in March when she was hospitalized.  Needs to follow-up with PCP re: MRI of this lesion.  Subjective:   Chief Complaint: Follow-up of diarrhea  HPI 89 year old white woman seen in February with diarrhea, sent to the hospital for admission where she was empirically treated with steroids and had a colonoscopy with random biopsies that did not show any colitis.  Admission from February 28 to January 16, 2023.  I elected to continue prednisone taper empirically because of response and patient/daughter preference.  She is much better than she was having several soft stools a day and making it to the bathroom and not having any incontinence.  Daughter and caregiver are present.  Patient is very hard of hearing and is able to participate in the interview when I get right next to her ear and speak to  her.  She is in a wheelchair again today.  She keeps asking about going back on Creon and think she needs it.  Daughter reports that appetite is off, patient tells me she does not have a taste or any particular food.  She was eating much better and had more energy on the higher dose prednisone.    She was also admitted to the hospital with dyspnea in March, consulted by [place] on 01/22/2023, it sounds like she had some explosive diarrhea related to milk ingestion so Lactaid was recommended and Florastor was added.  Wt Readings from Last 3 Encounters:  02/27/23 136 lb (61.7 kg)  02/21/23 134 lb 9.6 oz (61.1 kg)  01/21/23 132 lb (59.9 kg)    Allergies  Allergen Reactions   Penicillins Itching, Rash and Other (See Comments)    Did it involve swelling of the face/tongue/throat, SOB, or low BP? No Did it involve sudden or severe rash/hives, skin peeling, or any reaction on the inside of your mouth or nose? No Did you need to seek medical attention at a hospital or doctor's office? No When did it last happen?    30+ years   If all above answers are "NO", may proceed with cephalosporin use.    Current Meds  Medication Sig   ACCU-CHEK GUIDE test strip    acetaminophen (TYLENOL) 650 MG CR tablet Take 650 mg by mouth in the morning and at bedtime.   apixaban (ELIQUIS) 5 MG TABS tablet Take 1  tablet (5 mg total) by mouth 2 (two) times daily.   cholecalciferol (VITAMIN D) 25 MCG (1000 UNIT) tablet Take 1,000 Units by mouth daily.   diltiazem (CARDIZEM CD) 120 MG 24 hr capsule Take 1 capsule (120 mg total) by mouth daily.   ferrous sulfate 325 (65 FE) MG tablet Take 325 mg by mouth daily with breakfast.   lactase (LACTAID) 3000 units tablet Take 2 tablets (6,000 Units total) by mouth 3 (three) times daily with meals.   levothyroxine (SYNTHROID) 150 MCG tablet Take 150 mcg by mouth every morning.   LORazepam (ATIVAN) 1 MG tablet Take 0.5 mg by mouth at bedtime. Take additional 0.5 mg if needed    metoprolol tartrate (LOPRESSOR) 100 MG tablet TAKE 1 TABLET(100 MG) BY MOUTH TWICE DAILY   pantoprazole (PROTONIX) 40 MG tablet Take 1 tablet (40 mg total) by mouth daily.   Polyvinyl Alcohol-Povidone PF 1.4-0.6 % SOLN Place 1 drop into both eyes daily as needed (dry eyes).    potassium chloride SA (KLOR-CON M) 20 MEQ tablet Take 2 tablets (40 meq) by mouth twice a day for 2 days, then take 2 tablets (40 meq) by mouth once daily thereafter   rosuvastatin (CRESTOR) 10 MG tablet Take 10 mg by mouth at bedtime.   saccharomyces boulardii (FLORASTOR) 250 MG capsule Take 1 capsule (250 mg total) by mouth 2 (two) times daily.   sertraline (ZOLOFT) 50 MG tablet Take 50 mg by mouth daily.   vitamin B-12 (CYANOCOBALAMIN) 500 MCG tablet Take 500 mcg by mouth daily.   zinc gluconate 50 MG tablet Take 50 mg by mouth daily.   [DISCONTINUED] predniSONE (DELTASONE) 10 MG tablet Take 4 tablets (40 mg total) by mouth daily with breakfast for 2 days, THEN 3.5 tablets (35 mg total) daily with breakfast for 7 days, THEN 3 tablets (30 mg total) daily with breakfast for 7 days, THEN 2.5 tablets (25 mg total) daily with breakfast for 7 days, THEN 2 tablets (20 mg total) daily with breakfast for 30 days, THEN 1.5 tablets (15 mg total) daily with breakfast for 7 days, THEN 1 tablet (10 mg total) daily with breakfast for 7 days, THEN 1 tablet (10 mg total) daily with breakfast for 7 days, THEN 0.5 tablets (5 mg total) daily with breakfast for 7 days.   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   Cervical cancer    COLONIC POLYPS, ADENOMATOUS, HX OF 10/13/2010   Qualifier: Diagnosis of  By: Koleen Distance CMA (AAMA), Leisha     Dilated cardiomyopathy 08/27/2018    DJD (degenerative joint disease) of knee    EROSIVE ESOPHAGITIS 10/13/2010   Qualifier: Diagnosis of  By: Koleen Distance CMA (AAMA), Hulan Saas     Essential hypertension 10/13/2010   Qualifier: Diagnosis of  By: Koleen Distance CMA (AAMA), Baird Cancer, intermittent 07/09/2012   GERD (gastroesophageal reflux disease)    Glaucoma    Hiatal hernia s/p robotic repair & fundoplication 05/25/2017 10/13/2010   Qualifier: Diagnosis of  By: Koleen Distance CMA (AAMA), Hulan Saas     History of laser assisted in situ keratomileusis 07/25/2012   HYPERCHOLESTEROLEMIA 10/13/2010   Qualifier: Diagnosis of  By: Koleen Distance CMA (AAMA),  Leisha     Hypothyroidism, postsurgical    h/o thyroid cancer s/p thyroidectomy   Iron Deficiency Anemia 10/14/2010   Qualifier: Diagnosis of  By: Jarold Motto MD Mervyn Skeeters R    Irritable bowel syndrome 10/13/2010   Qualifier: Diagnosis of  By: Koleen Distance CMA (AAMA), Leisha     Lumbar stenosis with neurogenic claudication 04/20/2016   OSA on CPAP    settings at 10-11    Type II diabetes mellitus    type II    Past Surgical History:  Procedure Laterality Date   ABDOMINAL HYSTERECTOMY     "partial"   BIOPSY  01/15/2023   Procedure: BIOPSY;  Surgeon: Hilarie Fredrickson, MD;  Location: [facility] ENDOSCOPY;  Service: Gastroenterology;;   BREAST BIOPSY Right    BREAST LUMPECTOMY Right    CARDIOVERSION N/A 03/30/2014   Procedure: CARDIOVERSION - BEDSIDE;  Surgeon: Wendall Stade, MD;  Location: [facility];  Service: Cardiovascular;  Laterality: N/A;   CARDIOVERSION N/A 03/31/2014   Procedure: CARDIOVERSION  (BEDSIDE) ;  Surgeon: Lewayne Bunting, MD;  Location: [facility] OR;  Service: Cardiovascular;  Laterality: N/A;   CARDIOVERSION N/A 04/27/2014   Procedure: CARDIOVERSION;  Surgeon: Cassell Clement, MD;  Location: [facility] ENDOSCOPY;  Service: Cardiovascular;  Laterality: N/A;   CARDIOVERSION N/A 04/13/2015   Procedure: CARDIOVERSION;  Surgeon: Vesta Mixer, MD;  Location: [facility] ENDOSCOPY;  Service: Cardiovascular;  Laterality:  N/A;   CARDIOVERSION N/A 01/08/2017   Procedure: CARDIOVERSION;  Surgeon: Thurmon Fair, MD;  Location: [facility] ENDOSCOPY;  Service: Cardiovascular;  Laterality: N/A;   CARDIOVERSION N/A 09/19/2017   Procedure: CARDIOVERSION;  Surgeon: Wendall Stade, MD;  Location: [facility] ENDOSCOPY;  Service: Cardiovascular;  Laterality: N/A;   CARDIOVERSION N/A 10/25/2017   Procedure: CARDIOVERSION;  Surgeon: Nahser, Deloris Ping, MD;  Location: [facility] ENDOSCOPY;  Service: Cardiovascular;  Laterality: N/A;   CARDIOVERSION N/A 12/27/2018   Procedure: CARDIOVERSION;  Surgeon: Parke Poisson, MD;  Location: [facility] ENDOSCOPY;  Service: Cardiovascular;  Laterality: N/A;   CARDIOVERSION N/A 05/20/2019   Procedure: CARDIOVERSION;  Surgeon: Parke Poisson, MD;  Location: [facility] ENDOSCOPY;  Service: Cardiovascular;  Laterality: N/A;   CARDIOVERSION N/A 12/10/2019   Procedure: CARDIOVERSION;  Surgeon: Chrystie Nose, MD;  Location: [facility] ENDOSCOPY;  Service: Cardiovascular;  Laterality: N/A;   CARDIOVERSION N/A 12/31/2019   Procedure: CARDIOVERSION;  Surgeon: Thurmon Fair, MD;  Location: [facility] ENDOSCOPY;  Service: Cardiovascular;  Laterality: N/A;   CARDIOVERSION N/A 10/21/2020   Procedure: CARDIOVERSION;  Surgeon: Parke Poisson, MD;  Location: [facility] ENDOSCOPY;  Service: Cardiovascular;  Laterality: N/A;   CARDIOVERSION N/A 12/03/2020   Procedure: CARDIOVERSION;  Surgeon: Jake Bathe, MD;  Location: [facility] ENDOSCOPY;  Service: Cardiovascular;  Laterality: N/A;   CARDIOVERSION N/A 07/11/2021   Procedure: CARDIOVERSION;  Surgeon: Chrystie Nose, MD;  Location: [facility] ENDOSCOPY;  Service: Cardiovascular;  Laterality: N/A;   CARPAL TUNNEL RELEASE Right    CATARACT EXTRACTION W/ INTRAOCULAR LENS  IMPLANT, BILATERAL Bilateral    COLONOSCOPY     COLONOSCOPY N/A 01/15/2023   Procedure: COLONOSCOPY;  Surgeon: Hilarie Fredrickson, MD;  Location: [facility] ENDOSCOPY;  Service: Gastroenterology;  Laterality: N/A;   EXCISIONAL HEMORRHOIDECTOMY     INSERTION OF MESH  N/A 05/25/2017   Procedure: INSERTION OF MESH;  Surgeon: Axel Filler, MD;  Location: [facility] ORS;  Service: General;  Laterality: N/A;   TEE WITHOUT CARDIOVERSION N/A 03/30/2014   Procedure: TRANSESOPHAGEAL ECHOCARDIOGRAM (TEE);  Surgeon: Wendall Stade, MD;  Location: [facility] ENDOSCOPY;  Service: Cardiovascular;  Laterality: N/A;   THYROIDECTOMY     TONSILLECTOMY     Social History   Social History Narrative   Divorced, daughter Kris Hartmann is primary family member   Patient has private duty home care/assistance   No alcohol tobacco or drug use   family history includes Diabetes in her brother, brother, brother, brother, brother, brother, brother, child, sister, sister, and sister; Heart attack in her brother; Heart disease in her brother, brother, brother, brother, brother, brother, and sister; Pancreatic cancer in her sister; Prostate cancer in her father.   Review of Systems As above  Objective:   Physical Exam BP 94/60   Pulse 64   Ht 5\' 5"  (1.651 m)   Wt 136 lb (61.7 kg)   LMP  (LMP Unknown)   BMI 22.63 kg/m  Elderly white woman no acute distress in wheelchair very hard of hearing

## 2023-02-27 NOTE — Patient Instructions (Signed)
_______________________________________________________  If your blood pressure at your visit was 140/90 or greater, please contact your primary care physician to follow up on this.  _______________________________________________________  If you are age 87 or older, your body mass index should be between 23-30. Your Body mass index is 22.63 kg/m. If this is out of the aforementioned range listed, please consider follow up with your Primary Care Provider.  If you are age 74 or younger, your body mass index should be between 19-25. Your Body mass index is 22.63 kg/m. If this is out of the aformentioned range listed, please consider follow up with your Primary Care Provider.   ________________________________________________________  The [place] [facility] providers would like to encourage you to use [facility] to communicate with providers for non-urgent requests or questions.  Due to long hold times on the telephone, sending your provider a message by [facility] may be a faster and more efficient way to get a response.  Please allow 48 business hours for a response.  Please remember that this is for non-urgent requests.  _______________________________________________________  Follow up in 2 months   It was a pleasure to see you today!  Thank you for trusting me with your gastrointestinal care!

## 2023-02-28 LAB — BASIC METABOLIC PANEL
BUN/Creatinine Ratio: 17 (ref 12–28)
BUN: 12 mg/dL (ref 8–27)
CO2: 25 mmol/L (ref 20–29)
Calcium: 9 mg/dL (ref 8.7–10.3)
Chloride: 103 mmol/L (ref 96–106)
Creatinine, Ser: 0.7 mg/dL (ref 0.57–1.00)
Glucose: 136 mg/dL — ABNORMAL HIGH (ref 70–99)
Potassium: 3.6 mmol/L (ref 3.5–5.2)
Sodium: 142 mmol/L (ref 134–144)
eGFR: 83 mL/min/{1.73_m2} (ref >59)

## 2023-03-08 ENCOUNTER — Telehealth: Payer: Self-pay | Admitting: Cardiovascular Disease

## 2023-03-08 NOTE — Telephone Encounter (Signed)
[facility] is calling to get verbal order for BP readings and the parameters for this patient. The caller states that if she does not answer that you can leave a detailed message.

## 2023-03-12 ENCOUNTER — Telehealth: Payer: Medicare PPO | Admitting: Physician Assistant

## 2023-03-12 ENCOUNTER — Telehealth: Payer: Self-pay | Admitting: Internal Medicine

## 2023-03-12 ENCOUNTER — Other Ambulatory Visit: Payer: Self-pay | Admitting: Internal Medicine

## 2023-03-12 ENCOUNTER — Other Ambulatory Visit (HOSPITAL_COMMUNITY): Payer: Self-pay

## 2023-03-12 DIAGNOSIS — K529 Noninfective gastroenteritis and colitis, unspecified: Secondary | ICD-10-CM

## 2023-03-12 MED ORDER — PREDNISONE 10 MG PO TABS: ORAL_TABLET | ORAL | 0 refills | Status: DC

## 2023-03-12 NOTE — Telephone Encounter (Signed)
Pt c/o BP issue: STAT if pt c/o blurred vision, one-sided weakness or slurred speech  1. What are your last 5 BP readings?  Over the weekend - 98/74, 129/111, 138/120.  Today - 98/74  2. Are you having any other symptoms (ex. Dizziness, headache, blurred vision, passed out)? Per Gloriajean Dell, there are no symptoms. Gloriajean Dell thinks it is just anxiety. Have been encouraging hydration, other things to help the pt.  3. What is your BP issue? Gloriajean Dell states she called on 4/25 to request BP parameters. Pt had an exacerbation over the weekend where her BP was all over the place. Gloriajean Dell believes it is due to anxiety. Gloriajean Dell states the pt is doing ok now, but really needs the BP parameters to ensure the pt and home caregivers are comfortable.  She states that if she does not answer her phone, to please leave a vm!

## 2023-03-12 NOTE — Progress Notes (Signed)
Virtual Visit Consent   [name], you are scheduled for a virtual visit with a [facility] provider today. Just as with appointments in the office, your consent must be obtained to participate. Your consent will be active for this visit and any virtual visit you may have with one of our providers in the next 365 days. If you have a MyChart account, a copy of this consent can be sent to you electronically.  As this is a virtual visit, video technology does not allow for your provider to perform a traditional examination. This may limit your provider's ability to fully assess your condition. If your provider identifies any concerns that need to be evaluated in person or the need to arrange testing (such as labs, EKG, etc.), we will make arrangements to do so. Although advances in technology are sophisticated, we cannot ensure that it will always work on either your end or our end. If the connection with a video visit is poor, the visit may have to be switched to a telephone visit. With either a video or telephone visit, we are not always able to ensure that we have a secure connection.  By engaging in this virtual visit, you consent to the provision of healthcare and authorize for your insurance to be billed (if applicable) for the services provided during this visit. Depending on your insurance coverage, you may receive a charge related to this service.  I need to obtain your verbal consent now. Are you willing to proceed with your visit today? ALESANDRA SMART has provided verbal consent on 03/12/2023 for a virtual visit (video or telephone). [name], [place]  Date: 03/12/2023 1:52 PM  Virtual Visit via Video Note   I, [name], connected with  [name]  ([id number], Feb 23, 1931) on 03/12/23 at  1:45 PM EDT by a video-enabled telemedicine application and verified that I am speaking with the correct person using two identifiers.  Location: Patient: Virtual Visit  Location Patient: Home Provider: Virtual Visit Location Provider: Home Office   I discussed the limitations of evaluation and management by telemedicine and the availability of in person appointments. The patient expressed understanding and agreed to proceed.    History of Present Illness: [name] is a 89 y.o. who identifies as a female who was assigned female at birth, and is being seen today for a flare of her chronic diarrhea over the past week.  Notes yesterday having about 6 loose stools in a 12-hour period.  Patient and caregivers deny any blood or mucus noted in the stool.  Denies fever, chills or aches.  Has had a couple episodes of nausea without emesis.  They are keeping her hydrated and have notes she has a decent appetite this week.  Denies any complaints of abdominal pain, lightheadedness or dizziness.  Have reached out to the gastrointestinal provider but have not received a response from another provider as their regular provider is out of office.  OTC -- Imodium tablets -- 2am and then 2 at night without much improvement.    HPI: HPI  Problems:  Patient Active Problem List   Diagnosis Date Noted   Malnutrition of moderate degree 01/23/2023   Acute hypoxic respiratory failure (HCC) 01/21/2023   Chronic diastolic CHF (congestive heart failure) (HCC) 01/21/2023   Tachypnea 01/21/2023   Acute on chronic diastolic heart failure (HCC) 01/13/2023   Chronic diarrhea 01/12/2023   Generalized weakness 01/10/2023   Depression with anxiety 01/10/2023   Pleural  effusion on right 01/10/2023   Lesion of left native kidney 01/10/2023   Physical deconditioning 12/13/2022   Hypokalemia 12/13/2022   Diarrhea 12/13/2022   Fall 12/12/2022   Atypical atrial flutter (HCC) 06/13/2021   Secondary hypercoagulable state (HCC) 06/13/2021   A-fib (HCC)    Dilated cardiomyopathy (HCC) 08/27/2018   Acute bronchitis 11/15/2017   S/P Nissen fundoplication (without gastrostomy tube)  procedure 05/25/2017   Persistent atrial fibrillation (HCC)    Chronic cough 01/02/2017   Lumbar stenosis with neurogenic claudication 04/20/2016   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   Malignant neoplasm of breast (HCC) 02/25/2016   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   Atrial fibrillation with RVR (HCC) 03/31/2015   Post-surgical hypothyroidism 04/09/2014   Anticoagulated by anticoagulation treatment - Eliquis 03/30/2014   DMII (diabetes mellitus, type 2) (HCC)    Heart murmur    DJD (degenerative joint disease) of knee    Glaucoma    History of laser assisted in situ keratomileusis 07/25/2012   Degenerative disorder of eye 07/25/2012   Exotropia, intermittent 07/09/2012   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   Error, refractive, myopia 07/09/2012   ANEMIA, IRON DEFICIENCY 10/14/2010   GERD 10/14/2010   THYROID CANCER 10/13/2010   Hyperlipidemia associated with type 2 diabetes mellitus (HCC) 10/13/2010   ANEMIA, UNSPECIFIED 10/13/2010   Anxiety state 10/13/2010   MIGRAINE HEADACHE 10/13/2010   Essential hypertension 10/13/2010   EROSIVE ESOPHAGITIS 10/13/2010   BARRETTS ESOPHAGUS 10/13/2010   Hiatal hernia s/p robotic repair & fundoplication 05/25/2017 10/13/2010   Diverticulosis of colon without hemorrhage 10/13/2010   Irritable bowel syndrome 10/13/2010   OSTEOARTHRITIS 10/13/2010   CARDIAC MURMUR 10/13/2010   COLONIC POLYPS, ADENOMATOUS, HX OF 10/13/2010    Allergies:  Allergies  Allergen Reactions   Penicillins Itching, Rash and Other (See Comments)    Did it involve swelling of the face/tongue/throat, SOB, or low BP? No Did it involve sudden or severe rash/hives, skin peeling, or any reaction on the inside of your mouth or nose? No Did you need to seek medical attention at a hospital or doctor's office? No When did it last happen?    30+ years   If all above answers are "NO", may proceed with cephalosporin use.    Medications:  Current Outpatient  Medications:    ACCU-CHEK GUIDE test strip, , Disp: , Rfl:    acetaminophen (TYLENOL) 650 MG CR tablet, Take 650 mg by mouth in the morning and at bedtime., Disp: , Rfl:    apixaban (ELIQUIS) 5 MG TABS tablet, Take 1 tablet (5 mg total) by mouth 2 (two) times daily., Disp: 60 tablet, Rfl: 1   cholecalciferol (VITAMIN D) 25 MCG (1000 UNIT) tablet, Take 1,000 Units by mouth daily., Disp: , Rfl:    diltiazem (CARDIZEM CD) 120 MG 24 hr capsule, Take 1 capsule (120 mg total) by mouth daily., Disp: 90 capsule, Rfl: 3   ferrous sulfate 325 (65 FE) MG tablet, Take 325 mg by mouth daily with breakfast., Disp: , Rfl:    lactase (LACTAID) 3000 units tablet, Take 2 tablets (6,000 Units total) by mouth 3 (three) times daily with meals., Disp: 180 tablet, Rfl: 1   levothyroxine (SYNTHROID) 150 MCG tablet, Take 150 mcg by mouth every morning., Disp: , Rfl:    LORazepam (ATIVAN) 1 MG tablet, Take 0.5 mg by mouth at bedtime. Take additional 0.5 mg if needed, Disp: , Rfl:    metoprolol tartrate (LOPRESSOR) 100 MG tablet, TAKE 1 TABLET(100 MG) BY MOUTH TWICE DAILY,  Disp: 180 tablet, Rfl: 3   pantoprazole (PROTONIX) 40 MG tablet, Take 1 tablet (40 mg total) by mouth daily., Disp: 30 tablet, Rfl: 11   Polyvinyl Alcohol-Povidone PF 1.4-0.6 % SOLN, Place 1 drop into both eyes daily as needed (dry eyes). , Disp: , Rfl:    potassium chloride SA (KLOR-CON M) 20 MEQ tablet, Take 2 tablets (40 meq) by mouth twice a day for 2 days, then take 2 tablets (40 meq) by mouth once daily thereafter, Disp: 35 tablet, Rfl: 0   rosuvastatin (CRESTOR) 10 MG tablet, Take 10 mg by mouth at bedtime., Disp: , Rfl:    saccharomyces boulardii (FLORASTOR) 250 MG capsule, Take 1 capsule (250 mg total) by mouth 2 (two) times daily., Disp: , Rfl:    sertraline (ZOLOFT) 50 MG tablet, Take 50 mg by mouth daily., Disp: , Rfl:    vitamin B-12 (CYANOCOBALAMIN) 500 MCG tablet, Take 500 mcg by mouth daily., Disp: , Rfl:    zinc gluconate 50 MG tablet,  Take 50 mg by mouth daily., Disp: , Rfl:   Observations/Objective: Patient is well-developed, well-nourished in no acute distress.  Resting comfortably in chair at home.  Head is normocephalic, atraumatic.  No labored breathing. Speech is clear and coherent with logical content.  Patient is alert and oriented at baseline.   Assessment and Plan: 1. Chronic diarrhea  Per EMR review this seems to be an ongoing and recurring issue for her.  Has had evaluation with gastroenterology with overall negative workup but high suspicion of an underlying inflammatory bowel disease.  At previous visit, no further workup was pursued by family.  GI provider did discuss future use of prednisone for flares when needed.  As such and given no alarm signs or symptoms present and GI provider is out of office, will start a prednisone taper dose off of prior prescription for this issue.  Rx sent to pharmacy.  Supportive measures reviewed with patient and caregivers.  Strict ER precautions reviewed.  Follow Up Instructions: I discussed the assessment and treatment plan with the patient. The patient was provided an opportunity to ask questions and all were answered. The patient agreed with the plan and demonstrated an understanding of the instructions.  A copy of instructions were sent to the patient via MyChart unless otherwise noted below.   The patient was advised to call back or seek an in-person evaluation if the symptoms worsen or if the condition fails to improve as anticipated.  Time:  I spent 10 minutes with the patient via telehealth technology discussing the above problems/concerns.    Natasha Climes, PA-C

## 2023-03-12 NOTE — Patient Instructions (Signed)
[name], thank you for joining [name], PA-C for today's virtual visit.  While this provider is not your primary care provider (PCP), if your PCP is located in our provider database this encounter information will be shared with them immediately following your visit.   A [facility] MyChart account gives you access to today's visit and all your visits, tests, and labs performed at [facility] " click here if you don't have a [facility] MyChart account or go to [email or web address]  Consent: (Patient) [name] provided verbal consent for this virtual visit at the beginning of the encounter.  Current Medications:  Current Outpatient Medications:    ACCU-CHEK GUIDE test strip, , Disp: , Rfl:    acetaminophen (TYLENOL) 650 MG CR tablet, Take 650 mg by mouth in the morning and at bedtime., Disp: , Rfl:    apixaban (ELIQUIS) 5 MG TABS tablet, Take 1 tablet (5 mg total) by mouth 2 (two) times daily., Disp: 60 tablet, Rfl: 1   cholecalciferol (VITAMIN D) 25 MCG (1000 UNIT) tablet, Take 1,000 Units by mouth daily., Disp: , Rfl:    diltiazem (CARDIZEM CD) 120 MG 24 hr capsule, Take 1 capsule (120 mg total) by mouth daily., Disp: 90 capsule, Rfl: 3   ferrous sulfate 325 (65 FE) MG tablet, Take 325 mg by mouth daily with breakfast., Disp: , Rfl:    lactase (LACTAID) 3000 units tablet, Take 2 tablets (6,000 Units total) by mouth 3 (three) times daily with meals., Disp: 180 tablet, Rfl: 1   levothyroxine (SYNTHROID) 150 MCG tablet, Take 150 mcg by mouth every morning., Disp: , Rfl:    LORazepam (ATIVAN) 1 MG tablet, Take 0.5 mg by mouth at bedtime. Take additional 0.5 mg if needed, Disp: , Rfl:    metoprolol tartrate (LOPRESSOR) 100 MG tablet, TAKE 1 TABLET(100 MG) BY MOUTH TWICE DAILY, Disp: 180 tablet, Rfl: 3   pantoprazole (PROTONIX) 40 MG tablet, Take 1 tablet (40 mg total) by mouth daily., Disp: 30 tablet, Rfl: 11   Polyvinyl Alcohol-Povidone PF 1.4-0.6 %  SOLN, Place 1 drop into both eyes daily as needed (dry eyes). , Disp: , Rfl:    potassium chloride SA (KLOR-CON M) 20 MEQ tablet, Take 2 tablets (40 meq) by mouth twice a day for 2 days, then take 2 tablets (40 meq) by mouth once daily thereafter, Disp: 35 tablet, Rfl: 0   rosuvastatin (CRESTOR) 10 MG tablet, Take 10 mg by mouth at bedtime., Disp: , Rfl:    saccharomyces boulardii (FLORASTOR) 250 MG capsule, Take 1 capsule (250 mg total) by mouth 2 (two) times daily., Disp: , Rfl:    sertraline (ZOLOFT) 50 MG tablet, Take 50 mg by mouth daily., Disp: , Rfl:    vitamin B-12 (CYANOCOBALAMIN) 500 MCG tablet, Take 500 mcg by mouth daily., Disp: , Rfl:    zinc gluconate 50 MG tablet, Take 50 mg by mouth daily., Disp: , Rfl:    Medications ordered in this encounter:  No orders of the defined types were placed in this encounter.    *If you need refills on other medications prior to your next appointment, please contact your pharmacy*  Follow-Up: Call back or seek an in-person evaluation if the symptoms worsen or if the condition fails to improve as anticipated.  [facility] [phone]  Other Instructions Please continue to hydrate well and stick to a bland diet. Okay to continue use of Imodium over-the-counter for now. Take the prednisone as  directed. As discussed, you need to follow-up with Dr. Leone Payor when he returns to the office. If anything worsens or things or not improving within 24 hours with the medications given, I want you to be evaluated in person.  Do not delay care.   If you have been instructed to have an in-person evaluation today at a local Urgent Care facility, please use the link below. It will take you to a list of all of our available [facility], including address, phone number and hours of operation. Please do not delay care.  [facility]  If you or a family member do not have a primary care provider, use the link below to  schedule a visit and establish care. When you choose a [facility] primary care physician or advanced practice provider, you gain a long-term partner in health. Find a Primary Care Provider  Learn more about [facility]'s in-office and virtual care options: [facility] Get Care Now

## 2023-03-12 NOTE — Telephone Encounter (Signed)
PT daughter is calling about PT severe diarrhea. She is asking that she be put on prednisone because the infection is not cleared. She also wants to speak about getting stool sample kit and how to get sample if it is watery. Please advise.

## 2023-03-12 NOTE — Telephone Encounter (Signed)
Pt daughter Kathe Becton stated that her mom is having diarrhea that started a week ago and is progressively getting worse. Zella Ball stated that pt had 6 liquid stools yesterday. Chart reviewed. Last seen in office on 02/27/2023. Zella Ball stated that she is very week today, no pain, no nausea, Tolerating liquids and solids. Zella Ball is concerned about her age and possibility of pt getting dehydrated and needing to go to the ED, Taking imodium with no results. Robin requesting Prednisone whereas pt was on it prior and it subsided the diarrhea.  Please review and advise

## 2023-03-13 ENCOUNTER — Other Ambulatory Visit: Payer: Self-pay | Admitting: Physician Assistant

## 2023-03-13 MED ORDER — PREDNISONE 10 MG PO TABS: ORAL_TABLET | ORAL | 0 refills | Status: AC

## 2023-03-13 NOTE — Telephone Encounter (Signed)
Patient of Dr. Cooper. Please review for refill. Thank you!  

## 2023-03-13 NOTE — Telephone Encounter (Signed)
Prednisone Rx sent  Tell daughter to update me Thursday

## 2023-03-13 NOTE — Telephone Encounter (Signed)
Pt daughter Zella Ball made aware of Dr. Leone Payor recommendations: Notified that prescription has been sent to pharmacy and to update Dr. Leone Payor on Thursday. Robin  verbalized understanding with all questions answered.

## 2023-03-14 NOTE — Telephone Encounter (Signed)
Spoke with Excell Seltzer who states that he wants patient to continue on current dose of metoprolol and Diltiazem, but facility/family can hold IF systolic less than or pt becomes symptomatic (dizziness, confusion, risk of falling). She has failed amiodarone and tikosyn and her rate is just barely controlled on the current dose as is. Spoke with Gloriajean Dell at [facility] who did verbal read-back and no further questions.

## 2023-03-22 ENCOUNTER — Ambulatory Visit (HOSPITAL_COMMUNITY): Payer: Medicare PPO | Admitting: Internal Medicine

## 2023-03-26 DIAGNOSIS — I5032 Chronic diastolic (congestive) heart failure: Secondary | ICD-10-CM | POA: Diagnosis not present

## 2023-03-26 DIAGNOSIS — J9601 Acute respiratory failure with hypoxia: Secondary | ICD-10-CM | POA: Diagnosis not present

## 2023-03-26 DIAGNOSIS — I5033 Acute on chronic diastolic (congestive) heart failure: Secondary | ICD-10-CM | POA: Diagnosis not present

## 2023-04-16 ENCOUNTER — Ambulatory Visit (HOSPITAL_COMMUNITY): Payer: Medicare PPO | Admitting: Internal Medicine

## 2023-04-26 DIAGNOSIS — J9601 Acute respiratory failure with hypoxia: Secondary | ICD-10-CM | POA: Diagnosis not present

## 2023-04-26 DIAGNOSIS — I5032 Chronic diastolic (congestive) heart failure: Secondary | ICD-10-CM | POA: Diagnosis not present

## 2023-04-26 DIAGNOSIS — I5033 Acute on chronic diastolic (congestive) heart failure: Secondary | ICD-10-CM | POA: Diagnosis not present

## 2023-05-05 ENCOUNTER — Inpatient Hospital Stay (HOSPITAL_COMMUNITY)
Admission: EM | Admit: 2023-05-05 | Discharge: 2023-06-14 | DRG: 535 | Disposition: E | Payer: Medicare PPO | Attending: Pulmonary Disease | Admitting: Pulmonary Disease

## 2023-05-05 ENCOUNTER — Other Ambulatory Visit: Payer: Self-pay

## 2023-05-05 ENCOUNTER — Emergency Department (HOSPITAL_COMMUNITY): Payer: Medicare PPO

## 2023-05-05 ENCOUNTER — Encounter (HOSPITAL_COMMUNITY): Payer: Self-pay

## 2023-05-05 DIAGNOSIS — G4733 Obstructive sleep apnea (adult) (pediatric): Secondary | ICD-10-CM | POA: Diagnosis present

## 2023-05-05 DIAGNOSIS — Z79890 Hormone replacement therapy (postmenopausal): Secondary | ICD-10-CM

## 2023-05-05 DIAGNOSIS — Z9981 Dependence on supplemental oxygen: Secondary | ICD-10-CM

## 2023-05-05 DIAGNOSIS — M545 Low back pain, unspecified: Secondary | ICD-10-CM | POA: Diagnosis not present

## 2023-05-05 DIAGNOSIS — K7200 Acute and subacute hepatic failure without coma: Secondary | ICD-10-CM | POA: Diagnosis not present

## 2023-05-05 DIAGNOSIS — I10 Essential (primary) hypertension: Secondary | ICD-10-CM | POA: Diagnosis present

## 2023-05-05 DIAGNOSIS — Z833 Family history of diabetes mellitus: Secondary | ICD-10-CM

## 2023-05-05 DIAGNOSIS — E871 Hypo-osmolality and hyponatremia: Secondary | ICD-10-CM | POA: Diagnosis present

## 2023-05-05 DIAGNOSIS — W010XXA Fall on same level from slipping, tripping and stumbling without subsequent striking against object, initial encounter: Secondary | ICD-10-CM | POA: Diagnosis present

## 2023-05-05 DIAGNOSIS — S3282XA Multiple fractures of pelvis without disruption of pelvic ring, initial encounter for closed fracture: Secondary | ICD-10-CM | POA: Diagnosis not present

## 2023-05-05 DIAGNOSIS — I517 Cardiomegaly: Secondary | ICD-10-CM | POA: Diagnosis not present

## 2023-05-05 DIAGNOSIS — R918 Other nonspecific abnormal finding of lung field: Secondary | ICD-10-CM | POA: Diagnosis not present

## 2023-05-05 DIAGNOSIS — Z9841 Cataract extraction status, right eye: Secondary | ICD-10-CM

## 2023-05-05 DIAGNOSIS — S3210XA Unspecified fracture of sacrum, initial encounter for closed fracture: Secondary | ICD-10-CM | POA: Diagnosis not present

## 2023-05-05 DIAGNOSIS — Z7901 Long term (current) use of anticoagulants: Secondary | ICD-10-CM

## 2023-05-05 DIAGNOSIS — R9089 Other abnormal findings on diagnostic imaging of central nervous system: Secondary | ICD-10-CM | POA: Diagnosis not present

## 2023-05-05 DIAGNOSIS — Z9842 Cataract extraction status, left eye: Secondary | ICD-10-CM

## 2023-05-05 DIAGNOSIS — S199XXA Unspecified injury of neck, initial encounter: Secondary | ICD-10-CM | POA: Diagnosis not present

## 2023-05-05 DIAGNOSIS — R579 Shock, unspecified: Secondary | ICD-10-CM | POA: Diagnosis not present

## 2023-05-05 DIAGNOSIS — F32A Depression, unspecified: Secondary | ICD-10-CM | POA: Diagnosis present

## 2023-05-05 DIAGNOSIS — I082 Rheumatic disorders of both aortic and tricuspid valves: Secondary | ICD-10-CM | POA: Diagnosis present

## 2023-05-05 DIAGNOSIS — I4821 Permanent atrial fibrillation: Secondary | ICD-10-CM | POA: Diagnosis present

## 2023-05-05 DIAGNOSIS — N179 Acute kidney failure, unspecified: Secondary | ICD-10-CM | POA: Diagnosis not present

## 2023-05-05 DIAGNOSIS — Z8541 Personal history of malignant neoplasm of cervix uteri: Secondary | ICD-10-CM

## 2023-05-05 DIAGNOSIS — D539 Nutritional anemia, unspecified: Secondary | ICD-10-CM | POA: Diagnosis present

## 2023-05-05 DIAGNOSIS — K219 Gastro-esophageal reflux disease without esophagitis: Secondary | ICD-10-CM | POA: Diagnosis present

## 2023-05-05 DIAGNOSIS — F418 Other specified anxiety disorders: Secondary | ICD-10-CM | POA: Diagnosis present

## 2023-05-05 DIAGNOSIS — Z4682 Encounter for fitting and adjustment of non-vascular catheter: Secondary | ICD-10-CM | POA: Diagnosis not present

## 2023-05-05 DIAGNOSIS — S32512A Fracture of superior rim of left pubis, initial encounter for closed fracture: Secondary | ICD-10-CM | POA: Diagnosis not present

## 2023-05-05 DIAGNOSIS — Z781 Physical restraint status: Secondary | ICD-10-CM

## 2023-05-05 DIAGNOSIS — R609 Edema, unspecified: Secondary | ICD-10-CM | POA: Diagnosis not present

## 2023-05-05 DIAGNOSIS — W19XXXA Unspecified fall, initial encounter: Secondary | ICD-10-CM | POA: Diagnosis not present

## 2023-05-05 DIAGNOSIS — F411 Generalized anxiety disorder: Secondary | ICD-10-CM | POA: Diagnosis present

## 2023-05-05 DIAGNOSIS — I4819 Other persistent atrial fibrillation: Secondary | ICD-10-CM | POA: Diagnosis not present

## 2023-05-05 DIAGNOSIS — R946 Abnormal results of thyroid function studies: Secondary | ICD-10-CM | POA: Diagnosis present

## 2023-05-05 DIAGNOSIS — M1611 Unilateral primary osteoarthritis, right hip: Secondary | ICD-10-CM | POA: Diagnosis not present

## 2023-05-05 DIAGNOSIS — Z515 Encounter for palliative care: Secondary | ICD-10-CM

## 2023-05-05 DIAGNOSIS — G9341 Metabolic encephalopathy: Secondary | ICD-10-CM | POA: Diagnosis not present

## 2023-05-05 DIAGNOSIS — S32591A Other specified fracture of right pubis, initial encounter for closed fracture: Principal | ICD-10-CM | POA: Diagnosis present

## 2023-05-05 DIAGNOSIS — Z85850 Personal history of malignant neoplasm of thyroid: Secondary | ICD-10-CM

## 2023-05-05 DIAGNOSIS — Y92009 Unspecified place in unspecified non-institutional (private) residence as the place of occurrence of the external cause: Secondary | ICD-10-CM | POA: Diagnosis not present

## 2023-05-05 DIAGNOSIS — S32119A Unspecified Zone I fracture of sacrum, initial encounter for closed fracture: Secondary | ICD-10-CM | POA: Diagnosis present

## 2023-05-05 DIAGNOSIS — Z7984 Long term (current) use of oral hypoglycemic drugs: Secondary | ICD-10-CM

## 2023-05-05 DIAGNOSIS — E7800 Pure hypercholesterolemia, unspecified: Secondary | ICD-10-CM | POA: Diagnosis present

## 2023-05-05 DIAGNOSIS — E875 Hyperkalemia: Secondary | ICD-10-CM | POA: Diagnosis present

## 2023-05-05 DIAGNOSIS — R0902 Hypoxemia: Secondary | ICD-10-CM | POA: Diagnosis not present

## 2023-05-05 DIAGNOSIS — S0990XA Unspecified injury of head, initial encounter: Secondary | ICD-10-CM | POA: Diagnosis not present

## 2023-05-05 DIAGNOSIS — E872 Acidosis, unspecified: Secondary | ICD-10-CM | POA: Diagnosis present

## 2023-05-05 DIAGNOSIS — Z66 Do not resuscitate: Secondary | ICD-10-CM | POA: Diagnosis not present

## 2023-05-05 DIAGNOSIS — D509 Iron deficiency anemia, unspecified: Secondary | ICD-10-CM | POA: Diagnosis present

## 2023-05-05 DIAGNOSIS — M549 Dorsalgia, unspecified: Secondary | ICD-10-CM | POA: Diagnosis not present

## 2023-05-05 DIAGNOSIS — J90 Pleural effusion, not elsewhere classified: Secondary | ICD-10-CM | POA: Diagnosis not present

## 2023-05-05 DIAGNOSIS — S32511A Fracture of superior rim of right pubis, initial encounter for closed fracture: Secondary | ICD-10-CM | POA: Diagnosis not present

## 2023-05-05 DIAGNOSIS — J9601 Acute respiratory failure with hypoxia: Secondary | ICD-10-CM | POA: Diagnosis not present

## 2023-05-05 DIAGNOSIS — S32810A Multiple fractures of pelvis with stable disruption of pelvic ring, initial encounter for closed fracture: Secondary | ICD-10-CM | POA: Diagnosis not present

## 2023-05-05 DIAGNOSIS — E876 Hypokalemia: Secondary | ICD-10-CM | POA: Diagnosis present

## 2023-05-05 DIAGNOSIS — K6389 Other specified diseases of intestine: Secondary | ICD-10-CM | POA: Diagnosis not present

## 2023-05-05 DIAGNOSIS — Z961 Presence of intraocular lens: Secondary | ICD-10-CM | POA: Diagnosis present

## 2023-05-05 DIAGNOSIS — Z90711 Acquired absence of uterus with remaining cervical stump: Secondary | ICD-10-CM

## 2023-05-05 DIAGNOSIS — Z8042 Family history of malignant neoplasm of prostate: Secondary | ICD-10-CM

## 2023-05-05 DIAGNOSIS — Z800 Family history of malignant neoplasm of digestive organs: Secondary | ICD-10-CM

## 2023-05-05 DIAGNOSIS — E1165 Type 2 diabetes mellitus with hyperglycemia: Secondary | ICD-10-CM | POA: Diagnosis not present

## 2023-05-05 DIAGNOSIS — M25461 Effusion, right knee: Secondary | ICD-10-CM | POA: Diagnosis not present

## 2023-05-05 DIAGNOSIS — J690 Pneumonitis due to inhalation of food and vomit: Secondary | ICD-10-CM | POA: Diagnosis not present

## 2023-05-05 DIAGNOSIS — R5381 Other malaise: Secondary | ICD-10-CM | POA: Diagnosis present

## 2023-05-05 DIAGNOSIS — Z86010 Personal history of colonic polyps: Secondary | ICD-10-CM

## 2023-05-05 DIAGNOSIS — I4891 Unspecified atrial fibrillation: Secondary | ICD-10-CM | POA: Diagnosis not present

## 2023-05-05 DIAGNOSIS — Z880 Allergy status to penicillin: Secondary | ICD-10-CM

## 2023-05-05 DIAGNOSIS — E119 Type 2 diabetes mellitus without complications: Secondary | ICD-10-CM

## 2023-05-05 DIAGNOSIS — K567 Ileus, unspecified: Secondary | ICD-10-CM | POA: Diagnosis not present

## 2023-05-05 DIAGNOSIS — Z7189 Other specified counseling: Secondary | ICD-10-CM

## 2023-05-05 DIAGNOSIS — R6521 Severe sepsis with septic shock: Secondary | ICD-10-CM | POA: Diagnosis not present

## 2023-05-05 DIAGNOSIS — R739 Hyperglycemia, unspecified: Secondary | ICD-10-CM | POA: Diagnosis not present

## 2023-05-05 DIAGNOSIS — H409 Unspecified glaucoma: Secondary | ICD-10-CM | POA: Diagnosis present

## 2023-05-05 DIAGNOSIS — I672 Cerebral atherosclerosis: Secondary | ICD-10-CM | POA: Diagnosis not present

## 2023-05-05 DIAGNOSIS — Z91199 Patient's noncompliance with other medical treatment and regimen due to unspecified reason: Secondary | ICD-10-CM

## 2023-05-05 DIAGNOSIS — K449 Diaphragmatic hernia without obstruction or gangrene: Secondary | ICD-10-CM | POA: Diagnosis not present

## 2023-05-05 DIAGNOSIS — R54 Age-related physical debility: Secondary | ICD-10-CM | POA: Diagnosis present

## 2023-05-05 DIAGNOSIS — A419 Sepsis, unspecified organism: Secondary | ICD-10-CM | POA: Diagnosis not present

## 2023-05-05 DIAGNOSIS — H9190 Unspecified hearing loss, unspecified ear: Secondary | ICD-10-CM | POA: Diagnosis present

## 2023-05-05 DIAGNOSIS — E890 Postprocedural hypothyroidism: Secondary | ICD-10-CM | POA: Diagnosis present

## 2023-05-05 DIAGNOSIS — Z452 Encounter for adjustment and management of vascular access device: Secondary | ICD-10-CM | POA: Diagnosis not present

## 2023-05-05 DIAGNOSIS — R000 Tachycardia, unspecified: Secondary | ICD-10-CM | POA: Diagnosis not present

## 2023-05-05 DIAGNOSIS — M1711 Unilateral primary osteoarthritis, right knee: Secondary | ICD-10-CM | POA: Diagnosis not present

## 2023-05-05 DIAGNOSIS — S32592A Other specified fracture of left pubis, initial encounter for closed fracture: Secondary | ICD-10-CM | POA: Diagnosis present

## 2023-05-05 DIAGNOSIS — S329XXA Fracture of unspecified parts of lumbosacral spine and pelvis, initial encounter for closed fracture: Secondary | ICD-10-CM | POA: Diagnosis present

## 2023-05-05 DIAGNOSIS — K224 Dyskinesia of esophagus: Secondary | ICD-10-CM | POA: Diagnosis not present

## 2023-05-05 DIAGNOSIS — Z79899 Other long term (current) drug therapy: Secondary | ICD-10-CM

## 2023-05-05 DIAGNOSIS — Z7409 Other reduced mobility: Secondary | ICD-10-CM | POA: Diagnosis present

## 2023-05-05 DIAGNOSIS — R0602 Shortness of breath: Secondary | ICD-10-CM | POA: Diagnosis not present

## 2023-05-05 DIAGNOSIS — Z853 Personal history of malignant neoplasm of breast: Secondary | ICD-10-CM

## 2023-05-05 DIAGNOSIS — T17908A Unspecified foreign body in respiratory tract, part unspecified causing other injury, initial encounter: Secondary | ICD-10-CM

## 2023-05-05 DIAGNOSIS — Z043 Encounter for examination and observation following other accident: Secondary | ICD-10-CM | POA: Diagnosis not present

## 2023-05-05 DIAGNOSIS — Z8249 Family history of ischemic heart disease and other diseases of the circulatory system: Secondary | ICD-10-CM

## 2023-05-05 MED ORDER — FENTANYL CITRATE PF 50 MCG/ML IJ SOSY
25.0000 ug | PREFILLED_SYRINGE | INTRAMUSCULAR | Status: DC | PRN
  Administered 2023-05-05: 25 ug via INTRAVENOUS
  Filled 2023-05-05 (×2): qty 1

## 2023-05-05 MED ORDER — SODIUM CHLORIDE 0.9 % IV BOLUS
500.0000 mL | Freq: Once | INTRAVENOUS | Status: AC
  Administered 2023-05-05: 500 mL via INTRAVENOUS

## 2023-05-05 NOTE — ED Triage Notes (Signed)
Pt bib from home for a witnessed fall, pt lives at home and has aids that come in to assist. Pt fell from wheelchair earlier in the day and was able to be assisted back into chair. With movement later in the day pt states she had excruciating pain to back radiating down legs 10/10 pain. Denies hitting head and LOC. Takes eliquis. History AFIB. EMS gave 50 mcg fentanyl last dose 2225, prior to meds sat 96% after dropped into the 80s and placed on 4L [place]. Pt wears O2 PRN at night. A&O x4. EMS vitals HR 88-130, CBG 274, 96% 4L, BP 160/103. 22G L forearm

## 2023-05-05 NOTE — ED Provider Notes (Signed)
Emergency Department Provider Note   I have reviewed the triage vital signs and the nursing notes.   HISTORY  Chief Complaint Fall and Back Pain   HPI [name] is a 89 y.o. female with past history reviewed below including A-fib on Eliquis presents emergency department after mechanical fall at home.  She lives at home with health aide assistance and uses a wheelchair most of the time but is able to walk short distances and stand.  She was reaching to turn off a light when she lost her balance and fell to the floor.  Her aides were able to help her back into her wheelchair but given her level of pain EMS was called later in the afternoon.  She reports having pain in her lower back and legs.  Pain is worse with any movement.  Unsure regarding head injury.  Reports some mild shortness of breath but no chest pain.  She has been compliant with her anticoagulation.  No numbness or weakness in the legs.   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   Cervical cancer (HCC)    COLONIC POLYPS, ADENOMATOUS, HX OF 10/13/2010   Qualifier: Diagnosis of  By: Koleen Distance CMA (AAMA), Leisha     Dilated cardiomyopathy (HCC) 08/27/2018   DJD (degenerative joint disease) of knee    EROSIVE ESOPHAGITIS 10/13/2010   Qualifier: Diagnosis of  By: Koleen Distance CMA (AAMA), Hulan Saas     Essential hypertension 10/13/2010   Qualifier: Diagnosis of  By: Koleen Distance CMA (AAMA), Baird Cancer, intermittent 07/09/2012   GERD (gastroesophageal reflux disease)    Glaucoma    Hiatal hernia s/p robotic repair & fundoplication 05/25/2017 10/13/2010   Qualifier: Diagnosis of  By: Koleen Distance CMA (AAMA), Hulan Saas     History of laser assisted in  situ keratomileusis 07/25/2012   HYPERCHOLESTEROLEMIA 10/13/2010   Qualifier: Diagnosis of  By: Koleen Distance CMA (AAMA), Leisha     Hypothyroidism, postsurgical    h/o thyroid cancer s/p thyroidectomy   Iron Deficiency Anemia 10/14/2010   Qualifier: Diagnosis of  By: Jarold Motto MD Mervyn Skeeters R    Irritable bowel syndrome 10/13/2010   Qualifier: Diagnosis of  By: Koleen Distance CMA (AAMA), Leisha     Lumbar stenosis with neurogenic claudication 04/20/2016   OSA on CPAP    settings at 10-11    Type II diabetes mellitus (HCC)    type II     Review of Systems  Constitutional: No fever/chills Cardiovascular: Denies chest pain. Respiratory: Denies shortness of breath. Gastrointestinal: No abdominal pain.  Genitourinary: Negative for dysuria. Musculoskeletal: Positive for back pain and leg pain.  Skin: Negative for rash. Neurological: Negative for headaches, focal weakness or numbness.   ____________________________________________   PHYSICAL EXAM:  VITAL SIGNS: ED Triage Vitals  Enc Vitals Group     BP 05-19-2023 2248 (!) 148/87     Pulse Rate 05/19/23 2248 75     Resp 05/19/2023 2248 (!) 25     Temp 19-May-2023 2248 98.3 F (36.8 C)     Temp Source 2023-05-19 2248 Oral     SpO2 May 19, 2023 2248 91 %   Constitutional: Alert and oriented. Calling out  in pain with any movement with pain.  Eyes: Conjunctivae are normal.  Head: Atraumatic. Nose: No congestion/rhinnorhea. Mouth/Throat: Mucous membranes are moist.  Neck: No stridor. No cervical spine tenderness to palpation. Cardiovascular: Normal rate, regular rhythm. Good peripheral circulation. Grossly normal heart sounds.   Respiratory: Normal respiratory effort.  No retractions. Lungs CTAB. Gastrointestinal: Soft and nontender. No distention.  Musculoskeletal: Patient with tenderness over the right knee with mild swelling.  Pain with any range of motion of either hip. Normal ROM of the bilateral upper extremities.  Neurologic:  Normal speech and  language. No gross focal neurologic deficits are appreciated.  Skin:  Skin is warm, dry and intact. No rash noted.  ____________________________________________   LABS (all labs ordered are listed, but only abnormal results are displayed)  Labs Reviewed  CBC WITH DIFFERENTIAL/PLATELET - Abnormal; Notable for the following components:      Result Value   WBC 12.5 (*)    RBC 3.85 (*)    Neutro Abs 10.5 (*)    Monocytes Absolute 1.1 (*)    Abs Immature Granulocytes 0.13 (*)    All other components within normal limits  BASIC METABOLIC PANEL - Abnormal; Notable for the following components:   Potassium 2.8 (*)    Glucose, Bld 193 (*)    Calcium 8.2 (*)    All other components within normal limits  PROTIME-INR - Abnormal; Notable for the following components:   Prothrombin Time 17.2 (*)    INR 1.4 (*)    All other components within normal limits  I-STAT CHEM 8, ED - Abnormal; Notable for the following components:   Potassium 3.1 (*)    BUN 24 (*)    Glucose, Bld 188 (*)    Calcium, Ion 1.11 (*)    All other components within normal limits   ____________________________________________  EKG   EKG Interpretation  Date/Time:  Saturday May 05 2023 22:46:48 EDT Ventricular Rate:  130 PR Interval:    QRS Duration: 99 QT Interval:  348 QTC Calculation: 512 R Axis:   87 Text Interpretation: Atrial fibrillation Paired ventricular premature complexes Borderline right axis deviation Borderline repolarization abnormality Prolonged QT interval Confirmed by Alona Bene [phone]) on 04/22/2023 11:27:23 PM        ____________________________________________  RADIOLOGY  CT PELVIS WO CONTRAST  Result Date: 05/06/2023 CLINICAL DATA:  With pelvic fracture and possible bladder injury. EXAM: CT PELVIS WITHOUT CONTRAST TECHNIQUE: Multidetector CT imaging of the pelvis was performed following the standard protocol without intravenous contrast. RADIATION DOSE REDUCTION: This exam was  performed according to the departmental dose-optimization program which includes automated exposure control, adjustment of the mA and/or kV according to patient size and/or use of iterative reconstruction technique. COMPARISON:  None Available. FINDINGS: Urinary Tract: There is contrast material within the urinary bladder. No extravasation. Bowel:  Unremarkable visualized pelvic bowel loops. Vascular/Lymphatic: Calcific aortic atherosclerosis. Reproductive:  Status post hysterectomy. Other:  None Musculoskeletal: L4-5 PLIF. Minimally displaced fracture of the right sacral wing. Comminuted fractures of the inferior and superior pubic rami bilaterally IMPRESSION: 1. Minimally displaced fracture of the right sacral wing. 2. No evidence of bladder injury. 3. Comminuted fractures of the inferior and superior pubic rami bilaterally. Aortic Atherosclerosis (ICD10-I70.0). Electronically Signed   By: Deatra Robinson M.D.   On: 05/06/2023 02:54   CT CHEST ABDOMEN PELVIS W CONTRAST  Result Date: 05/06/2023 CLINICAL DATA:  Fall with back pain and pelvis pain EXAM: CT CHEST, ABDOMEN, AND PELVIS WITH CONTRAST TECHNIQUE: Multidetector CT imaging  of the chest, abdomen and pelvis was performed following the standard protocol during bolus administration of intravenous contrast. RADIATION DOSE REDUCTION: This exam was performed according to the departmental dose-optimization program which includes automated exposure control, adjustment of the mA and/or kV according to patient size and/or use of iterative reconstruction technique. CONTRAST:  OMNIPAQUE IOHEXOL 300 MG/ML  SOLN COMPARISON:  Chest radiograph 05/06/2023; CTA chest 01/21/2023; CT abdomen and pelvis 01/10/2023 FINDINGS: CT CHEST FINDINGS Cardiovascular: Cardiomegaly. No pericardial effusion. No acute aortic injury. No central pulmonary embolism. Coronary artery and aortic atherosclerotic calcification. Mediastinum/Nodes: Patulous esophagus and small hiatal hernia with  distal esophageal wall thickening. Trachea is unremarkable. No mediastinal hematoma. No thoracic adenopathy. Lungs/Pleura: Low-density small right pleural effusion. No left pleural effusion. No pneumothorax. Respiratory motion obscures fine detail. Atelectasis, architectural distortion, and bronchiolectasis in the right middle and lower lobes. Pneumonia is difficult to exclude. Subtle mosaic attenuation of the lungs compatible with air trapping. Musculoskeletal: No acute fracture or destructive osseous lesion. Multiple subacute or chronic right rib fractures. CT ABDOMEN PELVIS FINDINGS Hepatobiliary: No evidence of liver injury. Unremarkable gallbladder and biliary tree. Pancreas: Fatty atrophy.  No acute abnormality. Spleen: No splenic injury or perisplenic hematoma. Adrenals/Urinary Tract: No adrenal hemorrhage or renal injury identified. Unchanged indeterminate 1.1 cm left upper pole lesion. There are blood products in the anterior pelvis confluence with the anterior bladder wall (circa series 4/image 112) and bladder injury is not excluded. Stomach/Bowel: Normal caliber large and small bowel. Colonic diverticulosis without diverticulitis. No bowel wall thickening. Stomach is within normal limits. Vascular/Lymphatic: Aortic atherosclerosis. No enlarged abdominal or pelvic lymph nodes. Reproductive: No acute abnormality. Other: Extraperitoneal blood products in the pelvis. No free intraperitoneal air. Musculoskeletal: Posterior fusion L4-L5. Bilateral comminuted mildly displaced fractures of the inferior and superior pubic rami fractures extend into the pubic body bilaterally. Adjacent hyperdense fluid compatible with hemorrhage. Some of the hemorrhage is confluent with the anterior wall of the bladder (circa series 4/image 112). Bladder wall injury is difficult to exclude. IMPRESSION: 1. Bilateral comminuted mildly displaced fractures of the inferior and superior pubic rami extending into the pubic body  bilaterally. Adjacent extraperitoneal blood products in the pelvis. Some of the hemorrhage is confluent with the anterior wall of the bladder and bladder injury is not excluded. Unfortunately delayed images do not extend through the pelvis. This could be further evaluated with repeat noncontrast CT of the pelvis with attention for extraluminal excreted contrast outside the bladder. 2. Small low-density right pleural effusion. No pneumothorax. 3. Patulous esophagus and small hiatal hernia with distal esophageal wall thickening. Correlate for esophagitis. 4. Unchanged 1.1 cm indeterminate left upper pole renal lesion. If appropriate given life expectancy and comorbidities consider nonemergent contrast-enhanced MRI for further characterization. These results were called by telephone at the time of interpretation on 05/06/2023 at 1:31 am to provider Desha Bitner , who verbally acknowledged these results. Electronically Signed   By: Minerva Fester M.D.   On: 05/06/2023 01:31   CT Head Wo Contrast  Result Date: 05/06/2023 CLINICAL DATA:  Head trauma, moderate-severe; Neck trauma (Age >= 65y) EXAM: CT HEAD WITHOUT CONTRAST CT CERVICAL SPINE WITHOUT CONTRAST TECHNIQUE: Multidetector CT imaging of the head and cervical spine was performed following the standard protocol without intravenous contrast. Multiplanar CT image reconstructions of the cervical spine were also generated. RADIATION DOSE REDUCTION: This exam was performed according to the departmental dose-optimization program which includes automated exposure control, adjustment of the mA and/or kV according to patient size and/or  use of iterative reconstruction technique. COMPARISON:  CT head 12/05/2021, MRI cervical spine 11/26/2018 FINDINGS: CT HEAD FINDINGS Brain: Cerebral ventricle sizes are concordant with the degree of cerebral volume loss. No evidence of large-territorial acute infarction. No parenchymal hemorrhage. No mass lesion. No extra-axial collection.  No mass effect or midline shift. No hydrocephalus. Basilar cisterns are patent. Vascular: No hyperdense vessel. Atherosclerotic calcifications are present within the cavernous internal carotid and vertebral arteries. Skull: No acute fracture or focal lesion. Temporomandibular joint degenerative changes. Sinuses/Orbits: Paranasal sinuses and mastoid air cells are clear. Bilateral lens replacement. Otherwise the orbits are unremarkable. Other: None. CT CERVICAL SPINE FINDINGS Alignment: Stable grade 1 anterolisthesis of C5 on C6. Skull base and vertebrae: Multilevel severe degenerative changes spine. Moderate to severe left C3-C4 osseous neural foraminal stenosis. No severe osseous central canal stenosis. No acute fracture. No aggressive appearing focal osseous lesion or focal pathologic process. Soft tissues and spinal canal: No prevertebral fluid or swelling. No visible canal hematoma. Upper chest: Unremarkable. Other: None. IMPRESSION: 1. No acute intracranial abnormality. 2. No acute displaced fracture or traumatic listhesis of the cervical spine. Electronically Signed   By: Tish Frederickson M.D.   On: 05/06/2023 01:12   CT Cervical Spine Wo Contrast  Result Date: 05/06/2023 CLINICAL DATA:  Head trauma, moderate-severe; Neck trauma (Age >= 65y) EXAM: CT HEAD WITHOUT CONTRAST CT CERVICAL SPINE WITHOUT CONTRAST TECHNIQUE: Multidetector CT imaging of the head and cervical spine was performed following the standard protocol without intravenous contrast. Multiplanar CT image reconstructions of the cervical spine were also generated. RADIATION DOSE REDUCTION: This exam was performed according to the departmental dose-optimization program which includes automated exposure control, adjustment of the mA and/or kV according to patient size and/or use of iterative reconstruction technique. COMPARISON:  CT head 12/05/2021, MRI cervical spine 11/26/2018 FINDINGS: CT HEAD FINDINGS Brain: Cerebral ventricle sizes are  concordant with the degree of cerebral volume loss. No evidence of large-territorial acute infarction. No parenchymal hemorrhage. No mass lesion. No extra-axial collection. No mass effect or midline shift. No hydrocephalus. Basilar cisterns are patent. Vascular: No hyperdense vessel. Atherosclerotic calcifications are present within the cavernous internal carotid and vertebral arteries. Skull: No acute fracture or focal lesion. Temporomandibular joint degenerative changes. Sinuses/Orbits: Paranasal sinuses and mastoid air cells are clear. Bilateral lens replacement. Otherwise the orbits are unremarkable. Other: None. CT CERVICAL SPINE FINDINGS Alignment: Stable grade 1 anterolisthesis of C5 on C6. Skull base and vertebrae: Multilevel severe degenerative changes spine. Moderate to severe left C3-C4 osseous neural foraminal stenosis. No severe osseous central canal stenosis. No acute fracture. No aggressive appearing focal osseous lesion or focal pathologic process. Soft tissues and spinal canal: No prevertebral fluid or swelling. No visible canal hematoma. Upper chest: Unremarkable. Other: None. IMPRESSION: 1. No acute intracranial abnormality. 2. No acute displaced fracture or traumatic listhesis of the cervical spine. Electronically Signed   By: Tish Frederickson M.D.   On: 05/06/2023 01:12   DG Hip Unilat W or Wo Pelvis 2-3 Views Right  Result Date: 05/06/2023 CLINICAL DATA:  Status post fall. EXAM: DG HIP (WITH OR WITHOUT PELVIS) 2-3V RIGHT COMPARISON:  None Available. FINDINGS: Acute fracture deformities are suspected within the regions of the right superior and right inferior pubic rami. There is no evidence of dislocation. Marked severity degenerative changes are seen involving the right hip, in the form of joint space narrowing and acetabular sclerosis. IMPRESSION: Acute fracture deformities of the right superior and right inferior pubic rami. CT correlation is recommended. Electronically  Signed   By:  Aram Candela M.D.   On: 05/06/2023 00:43   DG Knee Complete 4 Views Right  Result Date: 05/06/2023 CLINICAL DATA:  Status post fall. EXAM: RIGHT KNEE - COMPLETE 4+ VIEW COMPARISON:  None Available. FINDINGS: No evidence of an acute fracture or dislocation. Medial and lateral marginal osteophyte formation is seen. There is marked severity patellofemoral, medial tibiofemoral and lateral tibiofemoral compartment space narrowing. Medial and lateral chondrocalcinosis is also present. A small joint effusion is suspected. IMPRESSION: 1. Marked severity tricompartmental degenerative changes. 2. Small joint effusion. Electronically Signed   By: Aram Candela M.D.   On: 05/06/2023 00:39   DG Chest Portable 1 View  Result Date: 05/06/2023 CLINICAL DATA:  Status post fall. EXAM: PORTABLE CHEST 1 VIEW COMPARISON:  February 27, 2023 FINDINGS: The cardiac silhouette is mildly enlarged. There is marked severity calcification of the aortic arch. Mild right perihilar, right infrahilar and right basilar atelectasis and/or infiltrate is noted. There is no evidence of a pleural effusion or pneumothorax. Radiopaque surgical clips are seen along the lateral aspect of the upper right chest wall. The visualized skeletal structures are unremarkable. IMPRESSION: Mild right perihilar, right infrahilar and right basilar atelectasis and/or infiltrate. Electronically Signed   By: Aram Candela M.D.   On: 05/06/2023 00:29    ____________________________________________   PROCEDURES  Procedure(s) performed:   Procedures  CRITICAL CARE Performed by: Maia Plan Total critical care time: 35 minutes Critical care time was exclusive of separately billable procedures and treating other patients. Critical care was necessary to treat or prevent imminent or life-threatening deterioration. Critical care was time spent personally by me on the following activities: development of treatment plan with patient and/or surrogate  as well as nursing, discussions with consultants, evaluation of patient's response to treatment, examination of patient, obtaining history from patient or surrogate, ordering and performing treatments and interventions, ordering and review of laboratory studies, ordering and review of radiographic studies, pulse oximetry and re-evaluation of patient's condition.  Alona Bene, MD Emergency Medicine  ____________________________________________   INITIAL IMPRESSION / ASSESSMENT AND PLAN / ED COURSE  Pertinent labs & imaging results that were available during my care of the patient were reviewed by me and considered in my medical decision making (see chart for details).   This patient is Presenting for Evaluation of fall, which does require a range of treatment options, and is a complaint that involves a high risk of morbidity and mortality.  The Differential Diagnoses includes subdural hematoma, epidural hematoma, acute concussion, traumatic subarachnoid hemorrhage, cerebral contusions, etc.   Critical Interventions-    Medications  fentaNYL (SUBLIMAZE) injection 25 mcg (25 mcg Intravenous Given May 21, 2023 2342)  diltiazem (CARDIZEM) 125 mg in dextrose 5% 125 mL (1 mg/mL) infusion (10 mg/hr Intravenous Rate/Dose Change 05/06/23 0651)  sodium chloride 0.9 % bolus 500 mL (0 mLs Intravenous Stopped 05-21-2023 2342)  iohexol (OMNIPAQUE) 300 MG/ML solution 100 mL (100 mLs Intravenous Contrast Given 05/06/23 0056)  diazepam (VALIUM) injection 2.5 mg (2.5 mg Intravenous Given 05/06/23 0136)  sodium chloride 0.9 % bolus 500 mL (0 mLs Intravenous Stopped 05/06/23 0240)    Reassessment after intervention: pain improved.    Clinical Laboratory Tests Ordered, included CBC with mild leukocytosis. No AKI. Mild hypokalemia at 2.8.   Radiologic Tests Ordered, included CT head, chest/abd/pelvis. I independently interpreted the images and agree with radiology interpretation.   Cardiac Monitor Tracing which shows  A fib w/ RVR.    Social Determinants of Health  Risk patient lives at home.   Consult complete with Radiology. Patient with bilateral pubic rami fx. ? Extra-peritoneal blood. Cannot exclude bladder injury. Can repeat CT pelvis with contrast now in the bladder. This was ordered.  Orthopedics - Dr. Linna Caprice. Discussed results by phone. No surgical plan. Pain mgmt w/ PT/OT. Plan for medicine admit.    TRH. Dr. Julian Reil. Plan for admit.   Medical Decision Making: Summary:  Patient presents emergency department for evaluation of pain after fall.  Fall peers to be mechanical.  She has pain mainly in her legs and on my exam seems focally tender over the right knee.  No large effusion.  Plan for lower dose fentanyl than she was given in the field given some hypoxemia with EMS but patient feels this did help her pain.   Reevaluation with update and discussion with patient. Discussed plan for pain mgmt, admit and rate control. No evidence of bladder injury on CT pelvis now with contrast in the bladder. Sacral wing fx identified on secondary scan.   Patient's presentation is most consistent with acute presentation with potential threat to life or bodily function.   Disposition: admit  ____________________________________________  FINAL CLINICAL IMPRESSION(S) / ED DIAGNOSES  Final diagnoses:  Fall, initial encounter  Closed nondisplaced fracture of pelvis, unspecified part of pelvis, initial encounter [facility])  Atrial fibrillation with RVR (HCC)    Note:  This document was prepared using Dragon voice recognition software and may include unintentional dictation errors.  Alona Bene, MD, [facility] Emergency Medicine    Caven Perine, Arlyss Repress, MD 05/08/23 [phone]

## 2023-05-06 ENCOUNTER — Emergency Department (HOSPITAL_COMMUNITY): Payer: Medicare PPO

## 2023-05-06 DIAGNOSIS — K7200 Acute and subacute hepatic failure without coma: Secondary | ICD-10-CM | POA: Diagnosis not present

## 2023-05-06 DIAGNOSIS — W19XXXA Unspecified fall, initial encounter: Secondary | ICD-10-CM | POA: Diagnosis not present

## 2023-05-06 DIAGNOSIS — A419 Sepsis, unspecified organism: Secondary | ICD-10-CM | POA: Diagnosis not present

## 2023-05-06 DIAGNOSIS — E119 Type 2 diabetes mellitus without complications: Secondary | ICD-10-CM | POA: Diagnosis not present

## 2023-05-06 DIAGNOSIS — R6521 Severe sepsis with septic shock: Secondary | ICD-10-CM | POA: Diagnosis not present

## 2023-05-06 DIAGNOSIS — Z515 Encounter for palliative care: Secondary | ICD-10-CM | POA: Diagnosis not present

## 2023-05-06 DIAGNOSIS — S32591A Other specified fracture of right pubis, initial encounter for closed fracture: Secondary | ICD-10-CM | POA: Diagnosis present

## 2023-05-06 DIAGNOSIS — F32A Depression, unspecified: Secondary | ICD-10-CM | POA: Diagnosis present

## 2023-05-06 DIAGNOSIS — E1165 Type 2 diabetes mellitus with hyperglycemia: Secondary | ICD-10-CM | POA: Diagnosis not present

## 2023-05-06 DIAGNOSIS — I4819 Other persistent atrial fibrillation: Secondary | ICD-10-CM | POA: Diagnosis not present

## 2023-05-06 DIAGNOSIS — I4891 Unspecified atrial fibrillation: Secondary | ICD-10-CM | POA: Diagnosis not present

## 2023-05-06 DIAGNOSIS — E890 Postprocedural hypothyroidism: Secondary | ICD-10-CM | POA: Diagnosis present

## 2023-05-06 DIAGNOSIS — S32810A Multiple fractures of pelvis with stable disruption of pelvic ring, initial encounter for closed fracture: Secondary | ICD-10-CM

## 2023-05-06 DIAGNOSIS — E872 Acidosis, unspecified: Secondary | ICD-10-CM | POA: Diagnosis present

## 2023-05-06 DIAGNOSIS — J690 Pneumonitis due to inhalation of food and vomit: Secondary | ICD-10-CM | POA: Diagnosis not present

## 2023-05-06 DIAGNOSIS — Y92009 Unspecified place in unspecified non-institutional (private) residence as the place of occurrence of the external cause: Secondary | ICD-10-CM | POA: Diagnosis not present

## 2023-05-06 DIAGNOSIS — W010XXA Fall on same level from slipping, tripping and stumbling without subsequent striking against object, initial encounter: Secondary | ICD-10-CM | POA: Diagnosis present

## 2023-05-06 DIAGNOSIS — S329XXA Fracture of unspecified parts of lumbosacral spine and pelvis, initial encounter for closed fracture: Secondary | ICD-10-CM | POA: Diagnosis present

## 2023-05-06 DIAGNOSIS — K567 Ileus, unspecified: Secondary | ICD-10-CM | POA: Diagnosis not present

## 2023-05-06 DIAGNOSIS — J9601 Acute respiratory failure with hypoxia: Secondary | ICD-10-CM | POA: Diagnosis not present

## 2023-05-06 DIAGNOSIS — Z66 Do not resuscitate: Secondary | ICD-10-CM | POA: Diagnosis not present

## 2023-05-06 DIAGNOSIS — R609 Edema, unspecified: Secondary | ICD-10-CM | POA: Diagnosis not present

## 2023-05-06 DIAGNOSIS — E871 Hypo-osmolality and hyponatremia: Secondary | ICD-10-CM | POA: Diagnosis present

## 2023-05-06 DIAGNOSIS — S32592A Other specified fracture of left pubis, initial encounter for closed fracture: Secondary | ICD-10-CM | POA: Diagnosis present

## 2023-05-06 DIAGNOSIS — R579 Shock, unspecified: Secondary | ICD-10-CM | POA: Diagnosis not present

## 2023-05-06 DIAGNOSIS — G9341 Metabolic encephalopathy: Secondary | ICD-10-CM | POA: Diagnosis not present

## 2023-05-06 DIAGNOSIS — S32119A Unspecified Zone I fracture of sacrum, initial encounter for closed fracture: Secondary | ICD-10-CM | POA: Diagnosis present

## 2023-05-06 DIAGNOSIS — I4821 Permanent atrial fibrillation: Secondary | ICD-10-CM | POA: Diagnosis present

## 2023-05-06 DIAGNOSIS — N179 Acute kidney failure, unspecified: Secondary | ICD-10-CM | POA: Diagnosis not present

## 2023-05-06 DIAGNOSIS — I10 Essential (primary) hypertension: Secondary | ICD-10-CM | POA: Diagnosis present

## 2023-05-06 DIAGNOSIS — D509 Iron deficiency anemia, unspecified: Secondary | ICD-10-CM | POA: Diagnosis present

## 2023-05-06 DIAGNOSIS — I082 Rheumatic disorders of both aortic and tricuspid valves: Secondary | ICD-10-CM | POA: Diagnosis present

## 2023-05-06 LAB — BASIC METABOLIC PANEL
Anion gap: 11 (ref 5–15)
BUN: 21 mg/dL (ref 8–23)
CO2: 27 mmol/L (ref 22–32)
Calcium: 8.2 mg/dL — ABNORMAL LOW (ref 8.9–10.3)
Chloride: 103 mmol/L (ref 98–111)
Creatinine, Ser: 0.63 mg/dL (ref 0.44–1.00)
GFR, Estimated: >60 mL/min (ref >60)
Glucose, Bld: 193 mg/dL — ABNORMAL HIGH (ref 70–99)
Potassium: 2.8 mmol/L — ABNORMAL LOW (ref 3.5–5.1)
Sodium: 141 mmol/L (ref 135–145)

## 2023-05-06 LAB — I-STAT CHEM 8, ED
BUN: 24 mg/dL — ABNORMAL HIGH (ref 8–23)
Calcium, Ion: 1.11 mmol/L — ABNORMAL LOW (ref 1.15–1.40)
Chloride: 103 mmol/L (ref 98–111)
Creatinine, Ser: 0.7 mg/dL (ref 0.44–1.00)
Glucose, Bld: 188 mg/dL — ABNORMAL HIGH (ref 70–99)
HCT: 36 % (ref 36.0–46.0)
Hemoglobin: 12.2 g/dL (ref 12.0–15.0)
Potassium: 3.1 mmol/L — ABNORMAL LOW (ref 3.5–5.1)
Sodium: 142 mmol/L (ref 135–145)
TCO2: 30 mmol/L (ref 22–32)

## 2023-05-06 LAB — CBC WITH DIFFERENTIAL/PLATELET
Abs Immature Granulocytes: 0.13 10*3/uL — ABNORMAL HIGH (ref 0.00–0.07)
Basophils Absolute: 0.1 10*3/uL (ref 0.0–0.1)
Basophils Relative: 0 %
Eosinophils Absolute: 0 10*3/uL (ref 0.0–0.5)
Eosinophils Relative: 0 %
HCT: 37.8 % (ref 36.0–46.0)
Hemoglobin: 12.3 g/dL (ref 12.0–15.0)
Immature Granulocytes: 1 %
Lymphocytes Relative: 6 %
Lymphs Abs: 0.7 10*3/uL (ref 0.7–4.0)
MCH: 31.9 pg (ref 26.0–34.0)
MCHC: 32.5 g/dL (ref 30.0–36.0)
MCV: 98.2 fL (ref 80.0–100.0)
Monocytes Absolute: 1.1 10*3/uL — ABNORMAL HIGH (ref 0.1–1.0)
Monocytes Relative: 9 %
Neutro Abs: 10.5 10*3/uL — ABNORMAL HIGH (ref 1.7–7.7)
Neutrophils Relative %: 84 %
Platelets: 162 10*3/uL (ref 150–400)
RBC: 3.85 MIL/uL — ABNORMAL LOW (ref 3.87–5.11)
RDW: 13.2 % (ref 11.5–15.5)
WBC: 12.5 10*3/uL — ABNORMAL HIGH (ref 4.0–10.5)
nRBC: 0 % (ref 0.0–0.2)

## 2023-05-06 LAB — PROTIME-INR
INR: 1.4 — ABNORMAL HIGH (ref 0.8–1.2)
Prothrombin Time: 17.2 seconds — ABNORMAL HIGH (ref 11.4–15.2)

## 2023-05-06 LAB — HEMOGLOBIN A1C
Hgb A1c MFr Bld: 7.3 % — ABNORMAL HIGH (ref 4.8–5.6)
Mean Plasma Glucose: 162.81 mg/dL

## 2023-05-06 MED ORDER — PANTOPRAZOLE SODIUM 40 MG PO TBEC
40.0000 mg | DELAYED_RELEASE_TABLET | Freq: Every day | ORAL | Status: DC
  Administered 2023-05-06 – 2023-05-13 (×8): 40 mg via ORAL
  Filled 2023-05-06 (×8): qty 1

## 2023-05-06 MED ORDER — POLYETHYLENE GLYCOL 3350 17 G PO PACK: 17.0000 g | PACK | Freq: Every day | ORAL | Status: DC | PRN

## 2023-05-06 MED ORDER — SODIUM CHLORIDE 0.9 % IV SOLN: INTRAVENOUS | Status: DC

## 2023-05-06 MED ORDER — METOPROLOL TARTRATE 5 MG/5ML IV SOLN: 5.0000 mg | Freq: Four times a day (QID) | INTRAVENOUS | Status: DC | PRN

## 2023-05-06 MED ORDER — DILTIAZEM HCL-DEXTROSE 125-5 MG/125ML-% IV SOLN (PREMIX)
5.0000 mg/h | INTRAVENOUS | Status: DC
  Administered 2023-05-06: 5 mg/h via INTRAVENOUS
  Administered 2023-05-06: 10 mg/h via INTRAVENOUS
  Administered 2023-05-07: 5 mg/h via INTRAVENOUS
  Filled 2023-05-06 (×3): qty 125

## 2023-05-06 MED ORDER — METOPROLOL TARTRATE 25 MG PO TABS
100.0000 mg | ORAL_TABLET | Freq: Two times a day (BID) | ORAL | Status: DC
  Administered 2023-05-06 – 2023-05-13 (×14): 100 mg via ORAL
  Filled 2023-05-06 (×16): qty 4

## 2023-05-06 MED ORDER — ONDANSETRON HCL 4 MG PO TABS: 4.0000 mg | ORAL_TABLET | Freq: Four times a day (QID) | ORAL | Status: DC | PRN

## 2023-05-06 MED ORDER — OXYCODONE HCL 5 MG PO TABS
5.0000 mg | ORAL_TABLET | ORAL | Status: DC | PRN
  Administered 2023-05-06 – 2023-05-08 (×7): 5 mg via ORAL
  Filled 2023-05-06 (×7): qty 1

## 2023-05-06 MED ORDER — LORAZEPAM 0.5 MG PO TABS
0.5000 mg | ORAL_TABLET | Freq: Every day | ORAL | Status: DC
  Administered 2023-05-06 – 2023-05-12 (×7): 0.5 mg via ORAL
  Filled 2023-05-06 (×7): qty 1

## 2023-05-06 MED ORDER — FERROUS SULFATE 325 (65 FE) MG PO TABS
325.0000 mg | ORAL_TABLET | Freq: Every day | ORAL | Status: DC
  Administered 2023-05-06 – 2023-05-13 (×8): 325 mg via ORAL
  Filled 2023-05-06 (×8): qty 1

## 2023-05-06 MED ORDER — MORPHINE SULFATE (PF) 2 MG/ML IV SOLN
2.0000 mg | INTRAVENOUS | Status: DC | PRN
  Administered 2023-05-06 – 2023-05-08 (×8): 2 mg via INTRAVENOUS
  Filled 2023-05-06 (×8): qty 1

## 2023-05-06 MED ORDER — DOCUSATE SODIUM 100 MG PO CAPS
100.0000 mg | ORAL_CAPSULE | Freq: Two times a day (BID) | ORAL | Status: DC
  Administered 2023-05-06 – 2023-05-07 (×2): 100 mg via ORAL
  Filled 2023-05-06 (×3): qty 1

## 2023-05-06 MED ORDER — ACETAMINOPHEN 325 MG PO TABS
650.0000 mg | ORAL_TABLET | Freq: Four times a day (QID) | ORAL | Status: DC | PRN
  Administered 2023-05-06: 650 mg via ORAL
  Filled 2023-05-06: qty 2

## 2023-05-06 MED ORDER — LEVOTHYROXINE SODIUM 75 MCG PO TABS
150.0000 ug | ORAL_TABLET | Freq: Every day | ORAL | Status: DC
  Administered 2023-05-07 – 2023-05-13 (×6): 150 ug via ORAL
  Filled 2023-05-06 (×8): qty 1

## 2023-05-06 MED ORDER — DIAZEPAM 5 MG/ML IJ SOLN
2.5000 mg | Freq: Once | INTRAMUSCULAR | Status: AC
  Administered 2023-05-06: 2.5 mg via INTRAVENOUS
  Filled 2023-05-06: qty 2

## 2023-05-06 MED ORDER — IOHEXOL 300 MG/ML  SOLN
100.0000 mL | Freq: Once | INTRAMUSCULAR | Status: AC | PRN
  Administered 2023-05-06: 100 mL via INTRAVENOUS

## 2023-05-06 MED ORDER — ALBUTEROL SULFATE (2.5 MG/3ML) 0.083% IN NEBU: 2.5000 mg | INHALATION_SOLUTION | RESPIRATORY_TRACT | Status: DC | PRN

## 2023-05-06 MED ORDER — METHOCARBAMOL 1000 MG/10ML IJ SOLN
500.0000 mg | Freq: Four times a day (QID) | INTRAVENOUS | Status: DC | PRN
  Administered 2023-05-06 – 2023-05-08 (×4): 500 mg via INTRAVENOUS
  Filled 2023-05-06 (×4): qty 500

## 2023-05-06 MED ORDER — DILTIAZEM HCL ER COATED BEADS 120 MG PO CP24: 120.0000 mg | ORAL_CAPSULE | Freq: Every day | ORAL | Status: DC

## 2023-05-06 MED ORDER — POTASSIUM CHLORIDE CRYS ER 20 MEQ PO TBCR
40.0000 meq | EXTENDED_RELEASE_TABLET | Freq: Every day | ORAL | Status: DC
  Administered 2023-05-06 – 2023-05-13 (×8): 40 meq via ORAL
  Filled 2023-05-06 (×8): qty 2

## 2023-05-06 MED ORDER — ACETAMINOPHEN 650 MG RE SUPP: 650.0000 mg | Freq: Four times a day (QID) | RECTAL | Status: DC | PRN

## 2023-05-06 MED ORDER — ONDANSETRON HCL 4 MG/2ML IJ SOLN
4.0000 mg | Freq: Four times a day (QID) | INTRAMUSCULAR | Status: DC | PRN
  Filled 2023-05-06: qty 2

## 2023-05-06 MED ORDER — APIXABAN 5 MG PO TABS: 5.0000 mg | ORAL_TABLET | Freq: Two times a day (BID) | ORAL | Status: DC

## 2023-05-06 MED ORDER — SODIUM CHLORIDE 0.9 % IV BOLUS
500.0000 mL | Freq: Once | INTRAVENOUS | Status: AC
  Administered 2023-05-06: 500 mL via INTRAVENOUS

## 2023-05-06 MED ORDER — SERTRALINE HCL 100 MG PO TABS
50.0000 mg | ORAL_TABLET | Freq: Every day | ORAL | Status: DC
  Administered 2023-05-06 – 2023-05-13 (×8): 50 mg via ORAL
  Filled 2023-05-06 (×8): qty 1

## 2023-05-06 MED ORDER — ROSUVASTATIN CALCIUM 10 MG PO TABS
10.0000 mg | ORAL_TABLET | Freq: Every day | ORAL | Status: DC
  Administered 2023-05-06 – 2023-05-12 (×7): 10 mg via ORAL
  Filled 2023-05-06 (×7): qty 1

## 2023-05-06 NOTE — Consult Note (Signed)
Reason for Consult: Pelvic fracture Referring Physician: Hospitalist service  HPI: [name] is an 89 y.o. female who was in her usual state of health when she fell at home on January 22.  She was admitted to the emergency room where CT scan confirmed pelvic fractures and she is being admitted through the hospitalist service.  At baseline she is an ambulator with a walker but continues to be active.  She does receive 24-hour care and lives in the home environment.  Her daughter Zella Ball is on the way to the hospital currently and I have spoken with on the phone.  She currently has wheelchair and assistive devices.  She had had a recent fall where they had initiated home health services through [facility] a few months ago.  Currently patient complains of pain in bilateral groins and inability to bear weight.  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   Cervical cancer (HCC)    COLONIC POLYPS, ADENOMATOUS, HX OF 10/13/2010   Qualifier: Diagnosis of  By: Koleen Distance CMA (AAMA), Leisha     Dilated cardiomyopathy (HCC) 08/27/2018   DJD (degenerative joint disease) of knee    EROSIVE ESOPHAGITIS 10/13/2010   Qualifier: Diagnosis of  By: Koleen Distance CMA (AAMA), Hulan Saas     Essential hypertension 10/13/2010   Qualifier: Diagnosis of  By: Koleen Distance CMA (AAMA), Baird Cancer, intermittent 07/09/2012   GERD (gastroesophageal reflux disease)    Glaucoma    Hiatal hernia s/p robotic repair & fundoplication 05/25/2017 10/13/2010   Qualifier: Diagnosis of  By: Koleen Distance CMA (AAMA), Hulan Saas     History of laser assisted in situ keratomileusis 07/25/2012   HYPERCHOLESTEROLEMIA 10/13/2010   Qualifier: Diagnosis of  By: Koleen Distance CMA (AAMA),  Leisha     Hypothyroidism, postsurgical    h/o thyroid cancer s/p thyroidectomy   Iron Deficiency Anemia 10/14/2010   Qualifier: Diagnosis of  By: Jarold Motto MD Mervyn Skeeters R    Irritable bowel syndrome 10/13/2010   Qualifier: Diagnosis of  By: Koleen Distance CMA (AAMA), Leisha     Lumbar stenosis with neurogenic claudication 04/20/2016   OSA on CPAP    settings at 10-11    Type II diabetes mellitus (HCC)    type II     Past Surgical History:  Procedure Laterality Date   ABDOMINAL HYSTERECTOMY     "partial"   BIOPSY  01/15/2023   Procedure: BIOPSY;  Surgeon: Hilarie Fredrickson, MD;  Location: [facility] ENDOSCOPY;  Service: Gastroenterology;;   BREAST BIOPSY Right    BREAST LUMPECTOMY Right    CARDIOVERSION N/A 03/30/2014   Procedure: CARDIOVERSION - BEDSIDE;  Surgeon: Wendall Stade, MD;  Location: [facility] OR;  Service: Cardiovascular;  Laterality: N/A;   CARDIOVERSION N/A 03/31/2014   Procedure: CARDIOVERSION  (BEDSIDE) ;  Surgeon: Lewayne Bunting, MD;  Location: [facility] OR;  Service: Cardiovascular;  Laterality: N/A;   CARDIOVERSION N/A 04/27/2014   Procedure: CARDIOVERSION;  Surgeon: Cassell Clement, MD;  Location: [facility] ENDOSCOPY;  Service: Cardiovascular;  Laterality: N/A;   CARDIOVERSION N/A 04/13/2015   Procedure: CARDIOVERSION;  Surgeon: Vesta Mixer, MD;  Location: [facility] ENDOSCOPY;  Service: Cardiovascular;  Laterality: N/A;   CARDIOVERSION  N/A 01/08/2017   Procedure: CARDIOVERSION;  Surgeon: Thurmon Fair, MD;  Location: [facility] ENDOSCOPY;  Service: Cardiovascular;  Laterality: N/A;   CARDIOVERSION N/A 09/19/2017   Procedure: CARDIOVERSION;  Surgeon: Wendall Stade, MD;  Location: [facility] ENDOSCOPY;  Service: Cardiovascular;  Laterality: N/A;   CARDIOVERSION N/A 10/25/2017   Procedure: CARDIOVERSION;  Surgeon: Elease Hashimoto Deloris Ping, MD;  Location: [facility] ENDOSCOPY;  Service: Cardiovascular;  Laterality: N/A;   CARDIOVERSION N/A 12/27/2018   Procedure: CARDIOVERSION;  Surgeon: Parke Poisson, MD;  Location: [facility] ENDOSCOPY;   Service: Cardiovascular;  Laterality: N/A;   CARDIOVERSION N/A 05/20/2019   Procedure: CARDIOVERSION;  Surgeon: Parke Poisson, MD;  Location: [facility] ENDOSCOPY;  Service: Cardiovascular;  Laterality: N/A;   CARDIOVERSION N/A 12/10/2019   Procedure: CARDIOVERSION;  Surgeon: Chrystie Nose, MD;  Location: [facility] ENDOSCOPY;  Service: Cardiovascular;  Laterality: N/A;   CARDIOVERSION N/A 12/31/2019   Procedure: CARDIOVERSION;  Surgeon: Thurmon Fair, MD;  Location: [facility] ENDOSCOPY;  Service: Cardiovascular;  Laterality: N/A;   CARDIOVERSION N/A 10/21/2020   Procedure: CARDIOVERSION;  Surgeon: Parke Poisson, MD;  Location: [facility] ENDOSCOPY;  Service: Cardiovascular;  Laterality: N/A;   CARDIOVERSION N/A 12/03/2020   Procedure: CARDIOVERSION;  Surgeon: Jake Bathe, MD;  Location: [facility] ENDOSCOPY;  Service: Cardiovascular;  Laterality: N/A;   CARDIOVERSION N/A 07/11/2021   Procedure: CARDIOVERSION;  Surgeon: Chrystie Nose, MD;  Location: [facility] ENDOSCOPY;  Service: Cardiovascular;  Laterality: N/A;   CARPAL TUNNEL RELEASE Right    CATARACT EXTRACTION W/ INTRAOCULAR LENS  IMPLANT, BILATERAL Bilateral    COLONOSCOPY     COLONOSCOPY N/A 01/15/2023   Procedure: COLONOSCOPY;  Surgeon: Hilarie Fredrickson, MD;  Location: [facility] ENDOSCOPY;  Service: Gastroenterology;  Laterality: N/A;   EXCISIONAL HEMORRHOIDECTOMY     INSERTION OF MESH N/A 05/25/2017   Procedure: INSERTION OF MESH;  Surgeon: Axel Filler, MD;  Location: [facility] ORS;  Service: General;  Laterality: N/A;   TEE WITHOUT CARDIOVERSION N/A 03/30/2014   Procedure: TRANSESOPHAGEAL ECHOCARDIOGRAM (TEE);  Surgeon: Wendall Stade, MD;  Location: [facility] ENDOSCOPY;  Service: Cardiovascular;  Laterality: N/A;   THYROIDECTOMY     TONSILLECTOMY      Family History  Problem Relation Age of Onset   Prostate cancer Father    Diabetes Sister    Heart disease Sister    Heart attack Brother    Heart disease Brother    Diabetes Brother    Diabetes Sister    Diabetes Sister     Pancreatic cancer Sister    Diabetes Brother    Heart disease Brother    Diabetes Brother    Heart disease Brother    Diabetes Brother    Heart disease Brother    Diabetes Brother    Heart disease Brother    Diabetes Brother    Heart disease Brother    Diabetes Brother    Diabetes Child     Social History:  reports that she has never smoked. She has never used smokeless tobacco. She reports that she does not drink alcohol and does not use drugs.  Allergies:  Allergies  Allergen Reactions   Penicillins Itching, Other (See Comments) and Rash    Did it involve swelling of the face/tongue/throat, SOB, or low BP? No  Did it involve sudden or severe rash/hives, skin peeling, or any reaction on the inside of your mouth or nose? No  Did you need to seek medical attention at a hospital or doctor's office? No  When did it last happen?  30+ years    If all above answers are "NO", may proceed with cephalosporin use.    Medications: I have reviewed the patient's current medications.  Results for orders placed or performed during the hospital encounter of 04/25/2023 (from the past 48 hour(s))  CBC with Differential     Status: Abnormal   Collection Time: 04/25/2023 11:55 PM  Result Value Ref Range   WBC 12.5 (H) 4.0 - 10.5 K/uL   RBC 3.85 (L) 3.87 - 5.11 MIL/uL   Hemoglobin 12.3 12.0 - 15.0 g/dL   HCT 56.2 13.0 - 86.5 %   MCV 98.2 80.0 - 100.0 fL   MCH 31.9 26.0 - 34.0 pg   MCHC 32.5 30.0 - 36.0 g/dL   RDW 78.4 69.6 - 29.5 %   Platelets 162 150 - 400 K/uL   nRBC 0.0 0.0 - 0.2 %   Neutrophils Relative % 84 %   Neutro Abs 10.5 (H) 1.7 - 7.7 K/uL   Lymphocytes Relative 6 %   Lymphs Abs 0.7 0.7 - 4.0 K/uL   Monocytes Relative 9 %   Monocytes Absolute 1.1 (H) 0.1 - 1.0 K/uL   Eosinophils Relative 0 %   Eosinophils Absolute 0.0 0.0 - 0.5 K/uL   Basophils Relative 0 %   Basophils Absolute 0.1 0.0 - 0.1 K/uL   Immature Granulocytes 1 %   Abs Immature Granulocytes 0.13 (H) 0.00 -  0.07 K/uL    Comment: Performed at [facility], [address] [place], [place] [address]  Basic metabolic panel     Status: Abnormal   Collection Time: 04/24/2023 11:55 PM  Result Value Ref Range   Sodium 141 135 - 145 mmol/L   Potassium 2.8 (L) 3.5 - 5.1 mmol/L   Chloride 103 98 - 111 mmol/L   CO2 27 22 - 32 mmol/L   Glucose, Bld 193 (H) 70 - 99 mg/dL    Comment: Glucose reference range applies only to samples taken after fasting for at least 8 hours.   BUN 21 8 - 23 mg/dL   Creatinine, Ser 2.44 0.44 - 1.00 mg/dL   Calcium 8.2 (L) 8.9 - 10.3 mg/dL   GFR, Estimated >01 >02 mL/min    Comment: (NOTE) Calculated using the CKD-EPI Creatinine Equation (2021)    Anion gap 11 5 - 15    Comment: Performed at [facility], [address], [place], [place] [address]  Protime-INR     Status: Abnormal   Collection Time: 05/03/2023 11:55 PM  Result Value Ref Range   Prothrombin Time 17.2 (H) 11.4 - 15.2 seconds   INR 1.4 (H) 0.8 - 1.2    Comment: (NOTE) INR goal varies based on device and disease states. Performed at [facility], [address], [place], [place] [address]   I-stat chem 8, ED     Status: Abnormal   Collection Time: 05/06/23 12:09 AM  Result Value Ref Range   Sodium 142 135 - 145 mmol/L   Potassium 3.1 (L) 3.5 - 5.1 mmol/L   Chloride 103 98 - 111 mmol/L   BUN 24 (H) 8 - 23 mg/dL   Creatinine, Ser 4.74 0.44 - 1.00 mg/dL   Glucose, Bld 259 (H) 70 - 99 mg/dL    Comment: Glucose reference range applies only to samples taken after fasting for at least 8 hours.   Calcium, Ion 1.11 (L) 1.15 - 1.40 mmol/L   TCO2 30 22 - 32 mmol/L   Hemoglobin 12.2  12.0 - 15.0 g/dL   HCT 65.7 84.6 - 96.2 %  Hemoglobin A1c     Status: Abnormal   Collection Time: 05/06/23  8:11 AM  Result Value Ref Range   Hgb A1c MFr Bld 7.3 (H) 4.8 - 5.6 %    Comment: (NOTE) Pre diabetes:          5.7%-6.4%  Diabetes:              >6.4%  Glycemic  control for   <7.0% adults with diabetes    Mean Plasma Glucose 162.81 mg/dL    Comment: Performed at [facility], [address] [place], [place] [address]    CT PELVIS WO CONTRAST  Result Date: 05/06/2023 CLINICAL DATA:  With pelvic fracture and possible bladder injury. EXAM: CT PELVIS WITHOUT CONTRAST TECHNIQUE: Multidetector CT imaging of the pelvis was performed following the standard protocol without intravenous contrast. RADIATION DOSE REDUCTION: This exam was performed according to the departmental dose-optimization program which includes automated exposure control, adjustment of the mA and/or kV according to patient size and/or use of iterative reconstruction technique. COMPARISON:  None Available. FINDINGS: Urinary Tract: There is contrast material within the urinary bladder. No extravasation. Bowel:  Unremarkable visualized pelvic bowel loops. Vascular/Lymphatic: Calcific aortic atherosclerosis. Reproductive:  Status post hysterectomy. Other:  None Musculoskeletal: L4-5 PLIF. Minimally displaced fracture of the right sacral wing. Comminuted fractures of the inferior and superior pubic rami bilaterally IMPRESSION: 1. Minimally displaced fracture of the right sacral wing. 2. No evidence of bladder injury. 3. Comminuted fractures of the inferior and superior pubic rami bilaterally. Aortic Atherosclerosis (ICD10-I70.0). Electronically Signed   By: Deatra Robinson M.D.   On: 05/06/2023 02:54   CT CHEST ABDOMEN PELVIS W CONTRAST  Result Date: 05/06/2023 CLINICAL DATA:  Fall with back pain and pelvis pain EXAM: CT CHEST, ABDOMEN, AND PELVIS WITH CONTRAST TECHNIQUE: Multidetector CT imaging of the chest, abdomen and pelvis was performed following the standard protocol during bolus administration of intravenous contrast. RADIATION DOSE REDUCTION: This exam was performed according to the departmental dose-optimization program which includes automated exposure control, adjustment of the mA and/or  kV according to patient size and/or use of iterative reconstruction technique. CONTRAST:  OMNIPAQUE IOHEXOL 300 MG/ML  SOLN COMPARISON:  Chest radiograph 05/06/2023; CTA chest 01/21/2023; CT abdomen and pelvis 01/10/2023 FINDINGS: CT CHEST FINDINGS Cardiovascular: Cardiomegaly. No pericardial effusion. No acute aortic injury. No central pulmonary embolism. Coronary artery and aortic atherosclerotic calcification. Mediastinum/Nodes: Patulous esophagus and small hiatal hernia with distal esophageal wall thickening. Trachea is unremarkable. No mediastinal hematoma. No thoracic adenopathy. Lungs/Pleura: Low-density small right pleural effusion. No left pleural effusion. No pneumothorax. Respiratory motion obscures fine detail. Atelectasis, architectural distortion, and bronchiolectasis in the right middle and lower lobes. Pneumonia is difficult to exclude. Subtle mosaic attenuation of the lungs compatible with air trapping. Musculoskeletal: No acute fracture or destructive osseous lesion. Multiple subacute or chronic right rib fractures. CT ABDOMEN PELVIS FINDINGS Hepatobiliary: No evidence of liver injury. Unremarkable gallbladder and biliary tree. Pancreas: Fatty atrophy.  No acute abnormality. Spleen: No splenic injury or perisplenic hematoma. Adrenals/Urinary Tract: No adrenal hemorrhage or renal injury identified. Unchanged indeterminate 1.1 cm left upper pole lesion. There are blood products in the anterior pelvis confluence with the anterior bladder wall (circa series 4/image 112) and bladder injury is not excluded. Stomach/Bowel: Normal caliber large and small bowel. Colonic diverticulosis without diverticulitis. No bowel wall thickening. Stomach is within normal limits. Vascular/Lymphatic: Aortic atherosclerosis. No enlarged abdominal  or pelvic lymph nodes. Reproductive: No acute abnormality. Other: Extraperitoneal blood products in the pelvis. No free intraperitoneal air. Musculoskeletal: Posterior  fusion L4-L5. Bilateral comminuted mildly displaced fractures of the inferior and superior pubic rami fractures extend into the pubic body bilaterally. Adjacent hyperdense fluid compatible with hemorrhage. Some of the hemorrhage is confluent with the anterior wall of the bladder (circa series 4/image 112). Bladder wall injury is difficult to exclude. IMPRESSION: 1. Bilateral comminuted mildly displaced fractures of the inferior and superior pubic rami extending into the pubic body bilaterally. Adjacent extraperitoneal blood products in the pelvis. Some of the hemorrhage is confluent with the anterior wall of the bladder and bladder injury is not excluded. Unfortunately delayed images do not extend through the pelvis. This could be further evaluated with repeat noncontrast CT of the pelvis with attention for extraluminal excreted contrast outside the bladder. 2. Small low-density right pleural effusion. No pneumothorax. 3. Patulous esophagus and small hiatal hernia with distal esophageal wall thickening. Correlate for esophagitis. 4. Unchanged 1.1 cm indeterminate left upper pole renal lesion. If appropriate given life expectancy and comorbidities consider nonemergent contrast-enhanced MRI for further characterization. These results were called by telephone at the time of interpretation on 05/06/2023 at 1:31 am to provider JOSHUA LONG , who verbally acknowledged these results. Electronically Signed   By: Minerva Fester M.D.   On: 05/06/2023 01:31   CT Head Wo Contrast  Result Date: 05/06/2023 CLINICAL DATA:  Head trauma, moderate-severe; Neck trauma (Age >= 65y) EXAM: CT HEAD WITHOUT CONTRAST CT CERVICAL SPINE WITHOUT CONTRAST TECHNIQUE: Multidetector CT imaging of the head and cervical spine was performed following the standard protocol without intravenous contrast. Multiplanar CT image reconstructions of the cervical spine were also generated. RADIATION DOSE REDUCTION: This exam was performed according to the  departmental dose-optimization program which includes automated exposure control, adjustment of the mA and/or kV according to patient size and/or use of iterative reconstruction technique. COMPARISON:  CT head 12/05/2021, MRI cervical spine 11/26/2018 FINDINGS: CT HEAD FINDINGS Brain: Cerebral ventricle sizes are concordant with the degree of cerebral volume loss. No evidence of large-territorial acute infarction. No parenchymal hemorrhage. No mass lesion. No extra-axial collection. No mass effect or midline shift. No hydrocephalus. Basilar cisterns are patent. Vascular: No hyperdense vessel. Atherosclerotic calcifications are present within the cavernous internal carotid and vertebral arteries. Skull: No acute fracture or focal lesion. Temporomandibular joint degenerative changes. Sinuses/Orbits: Paranasal sinuses and mastoid air cells are clear. Bilateral lens replacement. Otherwise the orbits are unremarkable. Other: None. CT CERVICAL SPINE FINDINGS Alignment: Stable grade 1 anterolisthesis of C5 on C6. Skull base and vertebrae: Multilevel severe degenerative changes spine. Moderate to severe left C3-C4 osseous neural foraminal stenosis. No severe osseous central canal stenosis. No acute fracture. No aggressive appearing focal osseous lesion or focal pathologic process. Soft tissues and spinal canal: No prevertebral fluid or swelling. No visible canal hematoma. Upper chest: Unremarkable. Other: None. IMPRESSION: 1. No acute intracranial abnormality. 2. No acute displaced fracture or traumatic listhesis of the cervical spine. Electronically Signed   By: Tish Frederickson M.D.   On: 05/06/2023 01:12   CT Cervical Spine Wo Contrast  Result Date: 05/06/2023 CLINICAL DATA:  Head trauma, moderate-severe; Neck trauma (Age >= 65y) EXAM: CT HEAD WITHOUT CONTRAST CT CERVICAL SPINE WITHOUT CONTRAST TECHNIQUE: Multidetector CT imaging of the head and cervical spine was performed following the standard protocol without  intravenous contrast. Multiplanar CT image reconstructions of the cervical spine were also generated. RADIATION DOSE REDUCTION: This exam  was performed according to the departmental dose-optimization program which includes automated exposure control, adjustment of the mA and/or kV according to patient size and/or use of iterative reconstruction technique. COMPARISON:  CT head 12/05/2021, MRI cervical spine 11/26/2018 FINDINGS: CT HEAD FINDINGS Brain: Cerebral ventricle sizes are concordant with the degree of cerebral volume loss. No evidence of large-territorial acute infarction. No parenchymal hemorrhage. No mass lesion. No extra-axial collection. No mass effect or midline shift. No hydrocephalus. Basilar cisterns are patent. Vascular: No hyperdense vessel. Atherosclerotic calcifications are present within the cavernous internal carotid and vertebral arteries. Skull: No acute fracture or focal lesion. Temporomandibular joint degenerative changes. Sinuses/Orbits: Paranasal sinuses and mastoid air cells are clear. Bilateral lens replacement. Otherwise the orbits are unremarkable. Other: None. CT CERVICAL SPINE FINDINGS Alignment: Stable grade 1 anterolisthesis of C5 on C6. Skull base and vertebrae: Multilevel severe degenerative changes spine. Moderate to severe left C3-C4 osseous neural foraminal stenosis. No severe osseous central canal stenosis. No acute fracture. No aggressive appearing focal osseous lesion or focal pathologic process. Soft tissues and spinal canal: No prevertebral fluid or swelling. No visible canal hematoma. Upper chest: Unremarkable. Other: None. IMPRESSION: 1. No acute intracranial abnormality. 2. No acute displaced fracture or traumatic listhesis of the cervical spine. Electronically Signed   By: Tish Frederickson M.D.   On: 05/06/2023 01:12   DG Hip Unilat W or Wo Pelvis 2-3 Views Right  Result Date: 05/06/2023 CLINICAL DATA:  Status post fall. EXAM: DG HIP (WITH OR WITHOUT PELVIS) 2-3V  RIGHT COMPARISON:  None Available. FINDINGS: Acute fracture deformities are suspected within the regions of the right superior and right inferior pubic rami. There is no evidence of dislocation. Marked severity degenerative changes are seen involving the right hip, in the form of joint space narrowing and acetabular sclerosis. IMPRESSION: Acute fracture deformities of the right superior and right inferior pubic rami. CT correlation is recommended. Electronically Signed   By: Aram Candela M.D.   On: 05/06/2023 00:43   DG Knee Complete 4 Views Right  Result Date: 05/06/2023 CLINICAL DATA:  Status post fall. EXAM: RIGHT KNEE - COMPLETE 4+ VIEW COMPARISON:  None Available. FINDINGS: No evidence of an acute fracture or dislocation. Medial and lateral marginal osteophyte formation is seen. There is marked severity patellofemoral, medial tibiofemoral and lateral tibiofemoral compartment space narrowing. Medial and lateral chondrocalcinosis is also present. A small joint effusion is suspected. IMPRESSION: 1. Marked severity tricompartmental degenerative changes. 2. Small joint effusion. Electronically Signed   By: Aram Candela M.D.   On: 05/06/2023 00:39   DG Chest Portable 1 View  Result Date: 05/06/2023 CLINICAL DATA:  Status post fall. EXAM: PORTABLE CHEST 1 VIEW COMPARISON:  February 27, 2023 FINDINGS: The cardiac silhouette is mildly enlarged. There is marked severity calcification of the aortic arch. Mild right perihilar, right infrahilar and right basilar atelectasis and/or infiltrate is noted. There is no evidence of a pleural effusion or pneumothorax. Radiopaque surgical clips are seen along the lateral aspect of the upper right chest wall. The visualized skeletal structures are unremarkable. IMPRESSION: Mild right perihilar, right infrahilar and right basilar atelectasis and/or infiltrate. Electronically Signed   By: Aram Candela M.D.   On: 05/06/2023 00:29    ROS: Per history of present  illness  Physical Exam:   Patient evaluated in bed.  She tolerates gentle hip flexion and internal/external rotation of the hips on logroll exam.  She has severe pain on compression of the pelvis today.  This is extremely painful  for her. Vitals Temp:  [97.4 F (36.3 C)-98.3 F (36.8 C)] 98.3 F (36.8 C) (06/23 0846) Pulse Rate:  [74-148] 98 (06/23 0846) Resp:  [21-41] 25 (06/23 0645) BP: (111-148)/(73-111) 111/73 (06/23 0846) SpO2:  [91 %-97 %] 95 % (06/23 0846) Weight:  [60.9 kg] 60.9 kg (06/23 0904) Body mass index is 21.67 kg/m.  Assessment/Plan: Impression: Bilateral superior and inferior pubic rami fractures along with a right sacral wing fracture Treatment: These fractures are stable and can be treated with weightbearing as tolerated however tend to be extremely painful in the first several weeks.  It is unclear when she is going to tolerate attempts at weightbearing however I will order physical therapy.  She may be weightbearing as tolerated.  As needed analgesics.  I have discussed with the daughter who is motivated to try to continue caring for her in the home that it can be unclear when her pain will be controlled enough that she can actually bear weight however these are stable fractures.  I will order case management and physical therapy to help advise on additional assistive devices and/or hospital bed that they may need in the home environment.  From our standpoint continue as needed analgesics.  She may be weightbearing as tolerated.  Follow-up with Dr. Linna Caprice as an outpatient in 2 to 3 weeks for repeat radiographs.  Ralene Bathe, PA-C for Dr. Samson Frederic 05/06/2023, 11:05 AM  Contact # [phone]

## 2023-05-06 NOTE — Progress Notes (Signed)
PT Cancellation Note  Patient Details Name: [name] MRN: [id number] DOB: 26-Jul-1931   Cancelled Treatment:    Reason Eval/Treat Not Completed: Fatigue/lethargy limiting ability to participate;Pain limiting ability to participate Pt just rolled for pericare with nursing staff, moaning in pain and very fatigued per RN.  RN also reports pt weaning from cardizem and has low BP.  Pt will likely need pain meds to be able to tolerate mobilizing and currently medically not able to take at this time.  Will check back as schedule permits.   Janan Halter [place] 05/06/2023, 3:20 PM Paulino Door, DPT Physical Therapist [facility] Office: [phone]

## 2023-05-06 NOTE — H&P (Addendum)
History and Physical  [name] NUU:[id number] DOB: April 29, 1931 DOA: May 29, 2023  PCP: Joya Martyr, MD   Chief Complaint: Fall at home, back and leg pain  HPI: [name] is a 89 y.o. female with medical history significant for diet-controlled diabetes, atrial fibrillation on Eliquis, hypertension, hyperlipidemia, hypothyroidism being admitted to the hospital with bilateral pelvic fractures after witnessed fall at home on 05-29-23.  History is provided by the patient, as well as her full-time caregiver who is at the bedside.  Currently patient has been in her usual state of health, typically gets around her home with a walker, able to use the bathroom on her own, etc.  Yesterday at some point she fell, it was witnessed, though they are unsure if she hit her head.  They are quite confident she did not lose consciousness, she was standing up out of her wheelchair to try to turn off a light.  They were able to assist her back into chair, brought her to the hospital via EMS later due to severe excruciating pain.  ED Course: On evaluation in the emergency department, patient was hypertensive, saturating well on room air, found to be in A-fib with RVR heart rate 140s.  Lab work showed leukocytosis 12.5, normal hemoglobin and platelets, INR 1.4, normal renal function, potassium 2.8.  Imaging detailed below consistent with bilateral comminuted pelvic fractures.  She was given fentanyl for pain, started on Cardizem drip.  ER provider discussed with orthopedic surgery Dr. Linna Caprice, who recommends that there are no plans for surgical repair, and hospitalist was contacted for admission.  Review of Systems: Please see HPI for pertinent positives and negatives. A complete 10 system review of systems are otherwise negative.  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   Cervical cancer (HCC)    COLONIC POLYPS, ADENOMATOUS, HX OF 10/13/2010   Qualifier: Diagnosis of  By: Koleen Distance CMA (AAMA), Leisha     Dilated cardiomyopathy (HCC) 08/27/2018   DJD (degenerative joint disease) of knee    EROSIVE ESOPHAGITIS 10/13/2010   Qualifier: Diagnosis of  By: Koleen Distance CMA (AAMA), Hulan Saas     Essential hypertension 10/13/2010   Qualifier: Diagnosis of  By: Koleen Distance CMA (AAMA), [place]aird Cancer, intermittent 07/09/2012   GERD (gastroesophageal reflux disease)    Glaucoma    Hiatal hernia s/p robotic repair & fundoplication 05/25/2017 10/13/2010   Qualifier: Diagnosis of  By: Koleen Distance CMA (AAMA), Hulan Saas     History of laser assisted in situ keratomileusis 07/25/2012   HYPERCHOLESTEROLEMIA 10/13/2010   Qualifier: Diagnosis of  By: Koleen Distance CMA (AAMA), Leisha     Hypothyroidism, postsurgical    h/o thyroid cancer s/p thyroidectomy   Iron Deficiency Anemia 10/14/2010   Qualifier: Diagnosis of  By: Jarold Motto MD Mervyn Skeeters R    Irritable bowel syndrome 10/13/2010   Qualifier: Diagnosis of  By: Koleen Distance CMA (AAMA), Leisha     Lumbar stenosis with neurogenic claudication 04/20/2016   OSA on CPAP    settings at 10-11    Type II diabetes mellitus (HCC)    type II    Past Surgical History:  Procedure Laterality Date   ABDOMINAL HYSTERECTOMY     "  partial"   BIOPSY  01/15/2023   Procedure: BIOPSY;  Surgeon: Hilarie Fredrickson, MD;  Location: [facility] ENDOSCOPY;  Service: Gastroenterology;;   BREAST BIOPSY Right    BREAST LUMPECTOMY Right    CARDIOVERSION N/A 03/30/2014   Procedure: CARDIOVERSION - BEDSIDE;  Surgeon: Wendall Stade, MD;  Location: [facility] OR;  Service: Cardiovascular;  Laterality: N/A;   CARDIOVERSION N/A 03/31/2014   Procedure: CARDIOVERSION  (BEDSIDE) ;  Surgeon: Lewayne Bunting, MD;  Location: [facility] OR;  Service: Cardiovascular;   Laterality: N/A;   CARDIOVERSION N/A 04/27/2014   Procedure: CARDIOVERSION;  Surgeon: Cassell Clement, MD;  Location: [facility] ENDOSCOPY;  Service: Cardiovascular;  Laterality: N/A;   CARDIOVERSION N/A 04/13/2015   Procedure: CARDIOVERSION;  Surgeon: Vesta Mixer, MD;  Location: [facility] ENDOSCOPY;  Service: Cardiovascular;  Laterality: N/A;   CARDIOVERSION N/A 01/08/2017   Procedure: CARDIOVERSION;  Surgeon: Thurmon Fair, MD;  Location: [facility] ENDOSCOPY;  Service: Cardiovascular;  Laterality: N/A;   CARDIOVERSION N/A 09/19/2017   Procedure: CARDIOVERSION;  Surgeon: Wendall Stade, MD;  Location: [facility] ENDOSCOPY;  Service: Cardiovascular;  Laterality: N/A;   CARDIOVERSION N/A 10/25/2017   Procedure: CARDIOVERSION;  Surgeon: Nahser, Deloris Ping, MD;  Location: [facility] ENDOSCOPY;  Service: Cardiovascular;  Laterality: N/A;   CARDIOVERSION N/A 12/27/2018   Procedure: CARDIOVERSION;  Surgeon: Parke Poisson, MD;  Location: [facility] ENDOSCOPY;  Service: Cardiovascular;  Laterality: N/A;   CARDIOVERSION N/A 05/20/2019   Procedure: CARDIOVERSION;  Surgeon: Parke Poisson, MD;  Location: [facility] ENDOSCOPY;  Service: Cardiovascular;  Laterality: N/A;   CARDIOVERSION N/A 12/10/2019   Procedure: CARDIOVERSION;  Surgeon: Chrystie Nose, MD;  Location: [facility] ENDOSCOPY;  Service: Cardiovascular;  Laterality: N/A;   CARDIOVERSION N/A 12/31/2019   Procedure: CARDIOVERSION;  Surgeon: Thurmon Fair, MD;  Location: [facility] ENDOSCOPY;  Service: Cardiovascular;  Laterality: N/A;   CARDIOVERSION N/A 10/21/2020   Procedure: CARDIOVERSION;  Surgeon: Parke Poisson, MD;  Location: [facility] ENDOSCOPY;  Service: Cardiovascular;  Laterality: N/A;   CARDIOVERSION N/A 12/03/2020   Procedure: CARDIOVERSION;  Surgeon: Jake Bathe, MD;  Location: [facility] ENDOSCOPY;  Service: Cardiovascular;  Laterality: N/A;   CARDIOVERSION N/A 07/11/2021   Procedure: CARDIOVERSION;  Surgeon: Chrystie Nose, MD;  Location: [facility] ENDOSCOPY;  Service: Cardiovascular;  Laterality: N/A;    CARPAL TUNNEL RELEASE Right    CATARACT EXTRACTION W/ INTRAOCULAR LENS  IMPLANT, BILATERAL Bilateral    COLONOSCOPY     COLONOSCOPY N/A 01/15/2023   Procedure: COLONOSCOPY;  Surgeon: Hilarie Fredrickson, MD;  Location: [facility] ENDOSCOPY;  Service: Gastroenterology;  Laterality: N/A;   EXCISIONAL HEMORRHOIDECTOMY     INSERTION OF MESH N/A 05/25/2017   Procedure: INSERTION OF MESH;  Surgeon: Axel Filler, MD;  Location: [facility] ORS;  Service: General;  Laterality: N/A;   TEE WITHOUT CARDIOVERSION N/A 03/30/2014   Procedure: TRANSESOPHAGEAL ECHOCARDIOGRAM (TEE);  Surgeon: Wendall Stade, MD;  Location: [facility] ENDOSCOPY;  Service: Cardiovascular;  Laterality: N/A;   THYROIDECTOMY     TONSILLECTOMY      Social History:  reports that she has never smoked. She has never used smokeless tobacco. She reports that she does not drink alcohol and does not use drugs.   Allergies  Allergen Reactions   Penicillins Itching, Rash and Other (See Comments)    Did it involve swelling of the face/tongue/throat, SOB, or low BP? No Did it involve sudden or severe rash/hives, skin peeling, or any reaction on the inside of your mouth or nose? No Did you need to seek medical attention  at a hospital or doctor's office? No When did it last happen?    30+ years   If all above answers are "NO", may proceed with cephalosporin use.     Family History  Problem Relation Age of Onset   Prostate cancer Father    Diabetes Sister    Heart disease Sister    Heart attack Brother    Heart disease Brother    Diabetes Brother    Diabetes Sister    Diabetes Sister    Pancreatic cancer Sister    Diabetes Brother    Heart disease Brother    Diabetes Brother    Heart disease Brother    Diabetes Brother    Heart disease Brother    Diabetes Brother    Heart disease Brother    Diabetes Brother    Heart disease Brother    Diabetes Brother    Diabetes Child      Prior to Admission medications   Medication Sig Start Date End Date  Taking? Authorizing Provider  ACCU-CHEK GUIDE test strip  01/09/20   [provider]  acetaminophen (TYLENOL) 650 MG CR tablet Take 650 mg by mouth in the morning and at bedtime.    [provider]  apixaban (ELIQUIS) 5 MG TABS tablet Take 1 tablet (5 mg total) by mouth 2 (two) times daily. 12/18/22   Newman Nip, NP  cholecalciferol (VITAMIN D) 25 MCG (1000 UNIT) tablet Take 1,000 Units by mouth daily.    [provider]  diltiazem (CARDIZEM CD) 120 MG 24 hr capsule Take 1 capsule (120 mg total) by mouth daily. 02/21/23   Sharlene Dory, PA-C  ferrous sulfate 325 (65 FE) MG tablet Take 325 mg by mouth daily with breakfast.    [provider]  lactase (LACTAID) 3000 units tablet Take 2 tablets (6,000 Units total) by mouth 3 (three) times daily with meals. 01/24/23   Rodolph Bong, MD  levothyroxine (SYNTHROID) 150 MCG tablet Take 150 mcg by mouth every morning. 12/25/22   [provider]  LORazepam (ATIVAN) 1 MG tablet Take 0.5 mg by mouth at bedtime. Take additional 0.5 mg if needed    [provider]  metFORMIN (GLUCOPHAGE-XR) 500 MG 24 hr tablet Take 500 mg by mouth 2 (two) times daily. 02/11/23   [provider]  metoprolol tartrate (LOPRESSOR) 100 MG tablet TAKE 1 TABLET(100 MG) BY MOUTH TWICE DAILY Patient taking differently: Take 100 mg by mouth 2 (two) times daily. 11/30/22   Newman Nip, NP  pantoprazole (PROTONIX) 40 MG tablet Take 1 tablet (40 mg total) by mouth daily. 01/16/23   Rhetta Mura, MD  Polyvinyl Alcohol-Povidone PF 1.4-0.6 % SOLN Place 1 drop into both eyes daily as needed (dry eyes).     [provider]  potassium chloride (KLOR-CON) 10 MEQ tablet Take 10 mEq by mouth daily.    [provider]  potassium chloride SA (KLOR-CON M) 20 MEQ tablet Take 2 tablets (40 mEq total) by mouth daily. 03/13/23   Sharlene Dory, PA-C  rosuvastatin (CRESTOR) 10 MG tablet Take 10 mg by mouth at  bedtime.    [provider]  saccharomyces boulardii (FLORASTOR) 250 MG capsule Take 1 capsule (250 mg total) by mouth 2 (two) times daily. 01/24/23   Rodolph Bong, MD  sertraline (ZOLOFT) 50 MG tablet Take 50 mg by mouth daily.    [provider]  vitamin B-12 (CYANOCOBALAMIN) 500 MCG tablet Take 500 mcg by  mouth daily.    [provider]  zinc gluconate 50 MG tablet Take 50 mg by mouth daily.    [provider]    Physical Exam: BP 122/87   Pulse 85   Temp 98 F (36.7 C) (Oral)   Resp (!) 25   LMP  (LMP Unknown)   SpO2 91%   General:  Alert, orientedx3, calm, in no acute distress.  Hard of hearing, asking some repetitive questions.  Caregiver at bedside. Eyes: EOMI, clear conjuctivae, white sclerea Neck: supple, no masses, trachea mildline  Cardiovascular: Tachycardic (120 at the time of my exam) and irregular, no murmurs or rubs, no peripheral edema  Respiratory: clear to auscultation bilaterally, no wheezes, no crackles  Abdomen: soft, nontender, nondistended, normal bowel tones heard  Skin: dry, no rashes  Musculoskeletal: no joint effusions Psychiatric: appropriate affect, normal speech  Neurologic: extraocular muscles intact, clear speech, moving all extremities with intact sensorium          Labs on Admission:  Basic Metabolic Panel: Recent Labs  Lab 05/11/2023 2355 05/06/23 0009  NA 141 142  K 2.8* 3.1*  CL 103 103  CO2 27  --   GLUCOSE 193* 188*  BUN 21 24*  CREATININE 0.63 0.70  CALCIUM 8.2*  --    Liver Function Tests: No results for input(s): "AST", "ALT", "ALKPHOS", "BILITOT", "PROT", "ALBUMIN" in the last 168 hours. No results for input(s): "LIPASE", "AMYLASE" in the last 168 hours. No results for input(s): "AMMONIA" in the last 168 hours. CBC: Recent Labs  Lab 05/09/2023 2355 05/06/23 0009  WBC 12.5*  --   NEUTROABS 10.5*  --   HGB 12.3 12.2  HCT 37.8 36.0  MCV 98.2  --   PLT 162  --    Cardiac  Enzymes: No results for input(s): "CKTOTAL", "CKMB", "CKMBINDEX", "TROPONINI" in the last 168 hours.  BNP (last 3 results) Recent Labs    01/11/23 0951 01/21/23 1115  BNP 468.9* 292.1*    ProBNP (last 3 results) No results for input(s): "PROBNP" in the last 8760 hours.  CBG: No results for input(s): "GLUCAP" in the last 168 hours.  Radiological Exams on Admission: CT PELVIS WO CONTRAST  Result Date: 05/06/2023 CLINICAL DATA:  With pelvic fracture and possible bladder injury. EXAM: CT PELVIS WITHOUT CONTRAST TECHNIQUE: Multidetector CT imaging of the pelvis was performed following the standard protocol without intravenous contrast. RADIATION DOSE REDUCTION: This exam was performed according to the departmental dose-optimization program which includes automated exposure control, adjustment of the mA and/or kV according to patient size and/or use of iterative reconstruction technique. COMPARISON:  None Available. FINDINGS: Urinary Tract: There is contrast material within the urinary bladder. No extravasation. Bowel:  Unremarkable visualized pelvic bowel loops. Vascular/Lymphatic: Calcific aortic atherosclerosis. Reproductive:  Status post hysterectomy. Other:  None Musculoskeletal: L4-5 PLIF. Minimally displaced fracture of the right sacral wing. Comminuted fractures of the inferior and superior pubic rami bilaterally IMPRESSION: 1. Minimally displaced fracture of the right sacral wing. 2. No evidence of bladder injury. 3. Comminuted fractures of the inferior and superior pubic rami bilaterally. Aortic Atherosclerosis (ICD10-I70.0). Electronically Signed   By: Deatra Robinson M.D.   On: 05/06/2023 02:54   CT CHEST ABDOMEN PELVIS W CONTRAST  Result Date: 05/06/2023 CLINICAL DATA:  Fall with back pain and pelvis pain EXAM: CT CHEST, ABDOMEN, AND PELVIS WITH CONTRAST TECHNIQUE: Multidetector CT imaging of the chest, abdomen and pelvis was performed following the standard protocol during bolus  administration of intravenous contrast.  RADIATION DOSE REDUCTION: This exam was performed according to the departmental dose-optimization program which includes automated exposure control, adjustment of the mA and/or kV according to patient size and/or use of iterative reconstruction technique. CONTRAST:  OMNIPAQUE IOHEXOL 300 MG/ML  SOLN COMPARISON:  Chest radiograph 05/06/2023; CTA chest 01/21/2023; CT abdomen and pelvis 01/10/2023 FINDINGS: CT CHEST FINDINGS Cardiovascular: Cardiomegaly. No pericardial effusion. No acute aortic injury. No central pulmonary embolism. Coronary artery and aortic atherosclerotic calcification. Mediastinum/Nodes: Patulous esophagus and small hiatal hernia with distal esophageal wall thickening. Trachea is unremarkable. No mediastinal hematoma. No thoracic adenopathy. Lungs/Pleura: Low-density small right pleural effusion. No left pleural effusion. No pneumothorax. Respiratory motion obscures fine detail. Atelectasis, architectural distortion, and bronchiolectasis in the right middle and lower lobes. Pneumonia is difficult to exclude. Subtle mosaic attenuation of the lungs compatible with air trapping. Musculoskeletal: No acute fracture or destructive osseous lesion. Multiple subacute or chronic right rib fractures. CT ABDOMEN PELVIS FINDINGS Hepatobiliary: No evidence of liver injury. Unremarkable gallbladder and biliary tree. Pancreas: Fatty atrophy.  No acute abnormality. Spleen: No splenic injury or perisplenic hematoma. Adrenals/Urinary Tract: No adrenal hemorrhage or renal injury identified. Unchanged indeterminate 1.1 cm left upper pole lesion. There are blood products in the anterior pelvis confluence with the anterior bladder wall (circa series 4/image 112) and bladder injury is not excluded. Stomach/Bowel: Normal caliber large and small bowel. Colonic diverticulosis without diverticulitis. No bowel wall thickening. Stomach is within normal limits. Vascular/Lymphatic:  Aortic atherosclerosis. No enlarged abdominal or pelvic lymph nodes. Reproductive: No acute abnormality. Other: Extraperitoneal blood products in the pelvis. No free intraperitoneal air. Musculoskeletal: Posterior fusion L4-L5. Bilateral comminuted mildly displaced fractures of the inferior and superior pubic rami fractures extend into the pubic body bilaterally. Adjacent hyperdense fluid compatible with hemorrhage. Some of the hemorrhage is confluent with the anterior wall of the bladder (circa series 4/image 112). Bladder wall injury is difficult to exclude. IMPRESSION: 1. Bilateral comminuted mildly displaced fractures of the inferior and superior pubic rami extending into the pubic body bilaterally. Adjacent extraperitoneal blood products in the pelvis. Some of the hemorrhage is confluent with the anterior wall of the bladder and bladder injury is not excluded. Unfortunately delayed images do not extend through the pelvis. This could be further evaluated with repeat noncontrast CT of the pelvis with attention for extraluminal excreted contrast outside the bladder. 2. Small low-density right pleural effusion. No pneumothorax. 3. Patulous esophagus and small hiatal hernia with distal esophageal wall thickening. Correlate for esophagitis. 4. Unchanged 1.1 cm indeterminate left upper pole renal lesion. If appropriate given life expectancy and comorbidities consider nonemergent contrast-enhanced MRI for further characterization. These results were called by telephone at the time of interpretation on 05/06/2023 at 1:31 am to provider JOSHUA LONG , who verbally acknowledged these results. Electronically Signed   By: Minerva Fester M.D.   On: 05/06/2023 01:31   CT Head Wo Contrast  Result Date: 05/06/2023 CLINICAL DATA:  Head trauma, moderate-severe; Neck trauma (Age >= 65y) EXAM: CT HEAD WITHOUT CONTRAST CT CERVICAL SPINE WITHOUT CONTRAST TECHNIQUE: Multidetector CT imaging of the head and cervical spine was  performed following the standard protocol without intravenous contrast. Multiplanar CT image reconstructions of the cervical spine were also generated. RADIATION DOSE REDUCTION: This exam was performed according to the departmental dose-optimization program which includes automated exposure control, adjustment of the mA and/or kV according to patient size and/or use of iterative reconstruction technique. COMPARISON:  CT head 12/05/2021, MRI cervical spine 11/26/2018 FINDINGS: CT HEAD FINDINGS  Brain: Cerebral ventricle sizes are concordant with the degree of cerebral volume loss. No evidence of large-territorial acute infarction. No parenchymal hemorrhage. No mass lesion. No extra-axial collection. No mass effect or midline shift. No hydrocephalus. Basilar cisterns are patent. Vascular: No hyperdense vessel. Atherosclerotic calcifications are present within the cavernous internal carotid and vertebral arteries. Skull: No acute fracture or focal lesion. Temporomandibular joint degenerative changes. Sinuses/Orbits: Paranasal sinuses and mastoid air cells are clear. Bilateral lens replacement. Otherwise the orbits are unremarkable. Other: None. CT CERVICAL SPINE FINDINGS Alignment: Stable grade 1 anterolisthesis of C5 on C6. Skull base and vertebrae: Multilevel severe degenerative changes spine. Moderate to severe left C3-C4 osseous neural foraminal stenosis. No severe osseous central canal stenosis. No acute fracture. No aggressive appearing focal osseous lesion or focal pathologic process. Soft tissues and spinal canal: No prevertebral fluid or swelling. No visible canal hematoma. Upper chest: Unremarkable. Other: None. IMPRESSION: 1. No acute intracranial abnormality. 2. No acute displaced fracture or traumatic listhesis of the cervical spine. Electronically Signed   By: Tish Frederickson M.D.   On: 05/06/2023 01:12   CT Cervical Spine Wo Contrast  Result Date: 05/06/2023 CLINICAL DATA:  Head trauma,  moderate-severe; Neck trauma (Age >= 65y) EXAM: CT HEAD WITHOUT CONTRAST CT CERVICAL SPINE WITHOUT CONTRAST TECHNIQUE: Multidetector CT imaging of the head and cervical spine was performed following the standard protocol without intravenous contrast. Multiplanar CT image reconstructions of the cervical spine were also generated. RADIATION DOSE REDUCTION: This exam was performed according to the departmental dose-optimization program which includes automated exposure control, adjustment of the mA and/or kV according to patient size and/or use of iterative reconstruction technique. COMPARISON:  CT head 12/05/2021, MRI cervical spine 11/26/2018 FINDINGS: CT HEAD FINDINGS Brain: Cerebral ventricle sizes are concordant with the degree of cerebral volume loss. No evidence of large-territorial acute infarction. No parenchymal hemorrhage. No mass lesion. No extra-axial collection. No mass effect or midline shift. No hydrocephalus. Basilar cisterns are patent. Vascular: No hyperdense vessel. Atherosclerotic calcifications are present within the cavernous internal carotid and vertebral arteries. Skull: No acute fracture or focal lesion. Temporomandibular joint degenerative changes. Sinuses/Orbits: Paranasal sinuses and mastoid air cells are clear. Bilateral lens replacement. Otherwise the orbits are unremarkable. Other: None. CT CERVICAL SPINE FINDINGS Alignment: Stable grade 1 anterolisthesis of C5 on C6. Skull base and vertebrae: Multilevel severe degenerative changes spine. Moderate to severe left C3-C4 osseous neural foraminal stenosis. No severe osseous central canal stenosis. No acute fracture. No aggressive appearing focal osseous lesion or focal pathologic process. Soft tissues and spinal canal: No prevertebral fluid or swelling. No visible canal hematoma. Upper chest: Unremarkable. Other: None. IMPRESSION: 1. No acute intracranial abnormality. 2. No acute displaced fracture or traumatic listhesis of the cervical  spine. Electronically Signed   By: Tish Frederickson M.D.   On: 05/06/2023 01:12   DG Hip Unilat W or Wo Pelvis 2-3 Views Right  Result Date: 05/06/2023 CLINICAL DATA:  Status post fall. EXAM: DG HIP (WITH OR WITHOUT PELVIS) 2-3V RIGHT COMPARISON:  None Available. FINDINGS: Acute fracture deformities are suspected within the regions of the right superior and right inferior pubic rami. There is no evidence of dislocation. Marked severity degenerative changes are seen involving the right hip, in the form of joint space narrowing and acetabular sclerosis. IMPRESSION: Acute fracture deformities of the right superior and right inferior pubic rami. CT correlation is recommended. Electronically Signed   By: Aram Candela M.D.   On: 05/06/2023 00:43   DG Knee Complete  4 Views Right  Result Date: 05/06/2023 CLINICAL DATA:  Status post fall. EXAM: RIGHT KNEE - COMPLETE 4+ VIEW COMPARISON:  None Available. FINDINGS: No evidence of an acute fracture or dislocation. Medial and lateral marginal osteophyte formation is seen. There is marked severity patellofemoral, medial tibiofemoral and lateral tibiofemoral compartment space narrowing. Medial and lateral chondrocalcinosis is also present. A small joint effusion is suspected. IMPRESSION: 1. Marked severity tricompartmental degenerative changes. 2. Small joint effusion. Electronically Signed   By: Aram Candela M.D.   On: 05/06/2023 00:39   DG Chest Portable 1 View  Result Date: 05/06/2023 CLINICAL DATA:  Status post fall. EXAM: PORTABLE CHEST 1 VIEW COMPARISON:  February 27, 2023 FINDINGS: The cardiac silhouette is mildly enlarged. There is marked severity calcification of the aortic arch. Mild right perihilar, right infrahilar and right basilar atelectasis and/or infiltrate is noted. There is no evidence of a pleural effusion or pneumothorax. Radiopaque surgical clips are seen along the lateral aspect of the upper right chest wall. The visualized skeletal  structures are unremarkable. IMPRESSION: Mild right perihilar, right infrahilar and right basilar atelectasis and/or infiltrate. Electronically Signed   By: Aram Candela M.D.   On: 05/06/2023 00:29    Assessment/Plan This is a 89 year old female with a history of diet-controlled diabetes, hypertension, hyperlipidemia, atrial fibrillation on Eliquis, obstructive sleep apnea on nocturnal oxygen being admitted to the hospital for pain control and rehabilitation after mechanical fall resulting in bilateral superior and inferior pubic rami fractures.  Bilateral superior and inferior pubic rami fractures-due to witnessed mechanical fall 6/22 -Inpatient admission to cardiac telemetry -Pain control with Tylenol, p.o. oxycodone as needed and IV morphine as needed -Anticipate orthopedic surgery consultation this morning, discussed with Ralene Bathe, PA -PT/OT after orthopedic consult  Atrial fibrillation with RVR-patient without complaints of chest pain, presyncope, normal blood pressure.  I suspect she went into RVR due to the stress of her fractures. -Resume home p.o. Cardizem and p.o. metoprolol -Continue IV Cardizem drip, wean as able -Note normal TSH 02/21/2023 -Given pelvic hemorrhage associated with pubic rami fractures, will hold Eliquis anticoagulation for now.  Given her fall with severe injury and advanced age, would consider risk/benefit of continued anticoagulation.  DM2-diet controlled, last hemoglobin A1c was less than 6 on 12/12/22 -Carb controlled diet -Update hemoglobin A1c  Acute on chronic hypoxic respiratory failure-she has a history of OSA and uses oxygen nocturnally, currently on supplemental oxygen likely due to receiving narcotics/napping this morning in the ER  Hypokalemia-this is a chronic problem for her, continue KCl 40 mEq p.o. daily  Deficiency anemia-continue ferrous sulfate, note normal hemoglobin  GERD-continue  Protonix  Depression-Zoloft  Hyperlipidemia-Crestor  DVT prophylaxis: SCDs only     Code Status: Full Code  Plan of care was discussed with the patient, as well as her caregiver at the bedside who mentioned she will pass information along to the patient's family.  All questions answered.  Also spoke in detail this morning over the phone with the patient's daughter Kris Hartmann.  Consults called: Orthopedic surgery  Admission status: The appropriate patient status for this patient is INPATIENT. Inpatient status is judged to be reasonable and necessary in order to provide the required intensity of service to ensure the patient's safety. The patient's presenting symptoms, physical exam findings, and initial radiographic and laboratory data in the context of their chronic comorbidities is felt to place them at high risk for further clinical deterioration. Furthermore, it is not anticipated that the patient will be medically stable  for discharge from the hospital within 2 midnights of admission.    I certify that at the point of admission it is my clinical judgment that the patient will require inpatient hospital care spanning beyond 2 midnights from the point of admission due to high intensity of service, high risk for further deterioration and high frequency of surveillance required  Time spent: 62 minutes  Pranish Akhavan Sharlette Dense MD [facility] Pager [phone]  If 7PM-7AM, please contact night-coverage [email or web address] Password [facility]  05/06/2023, 7:48 AM

## 2023-05-06 NOTE — ED Notes (Signed)
ED TO INPATIENT HANDOFF REPORT  ED Nurse Name and Phone #: Arnetha Gula Name/Age/Gender [name] 89 y.o. female Room/Bed: WA16/WA16  Code Status   Code Status: Full Code  Home/SNF/Other Home Patient oriented to: self and place Is this baseline? Yes   Triage Complete: Triage complete  Chief Complaint Pelvic fracture (HCC) [S32.9XXA]  Triage Note Pt bib from home for a witnessed fall, pt lives at home and has aids that come in to assist. Pt fell from wheelchair earlier in the day and was able to be assisted back into chair. With movement later in the day pt states she had excruciating pain to back radiating down legs 10/10 pain. Denies hitting head and LOC. Takes eliquis. History AFIB. EMS gave 50 mcg fentanyl last dose 2225, prior to meds sat 96% after dropped into the 80s and placed on 4L [place]. Pt wears O2 PRN at night. A&O x4. EMS vitals HR 88-130, CBG 274, 96% 4L, BP 160/103. 22G L forearm    Allergies Allergies  Allergen Reactions   Penicillins Itching, Rash and Other (See Comments)    Did it involve swelling of the face/tongue/throat, SOB, or low BP? No Did it involve sudden or severe rash/hives, skin peeling, or any reaction on the inside of your mouth or nose? No Did you need to seek medical attention at a hospital or doctor's office? No When did it last happen?    30+ years   If all above answers are "NO", may proceed with cephalosporin use.     Level of Care/Admitting Diagnosis ED Disposition     ED Disposition  Admit   Condition  --   Comment  Hospital Area: [facility] [[id number]]  Level of Care: Telemetry [5]  Admit to tele based on following criteria: Complex arrhythmia (Bradycardia/Tachycardia)  May admit patient to [facility] or [facility] if equivalent level of care is available:: Yes  Covid Evaluation: Asymptomatic - no recent exposure (last 10 days) testing not required  Diagnosis: Pelvic fracture [facility]) [[id number]]   Admitting Physician: Maryln Gottron [[id number]]  Attending Physician: Olexa.Dam, MIR Jaxson.Roy [[id number]]  Certification:: I certify this patient will need inpatient services for at least 2 midnights  Estimated Length of Stay: 3          B Medical/Surgery History 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   Anxiety state 10/13/2010   Qualifier: Diagnosis of  By: Koleen Distance CMA (AAMA), Leisha     Barrett's esophagus    Breast cancer (HCC)    s/p right breast lumpectomy   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   Cervical cancer (HCC)    COLONIC POLYPS, ADENOMATOUS, HX OF 10/13/2010   Qualifier: Diagnosis of  By: Koleen Distance CMA (AAMA), Leisha     Dilated cardiomyopathy (HCC) 08/27/2018   DJD (degenerative joint disease) of knee    EROSIVE ESOPHAGITIS 10/13/2010   Qualifier: Diagnosis of  By: Koleen Distance CMA (AAMA), Hulan Saas     Essential hypertension 10/13/2010   Qualifier: Diagnosis of  By: Koleen Distance CMA (AAMA), [place]aird Cancer, intermittent 07/09/2012   GERD (gastroesophageal reflux disease)    Glaucoma    Hiatal hernia s/p robotic repair & fundoplication 05/25/2017 10/13/2010   Qualifier: Diagnosis of  By: Koleen Distance CMA (AAMA), Hulan Saas     History of laser assisted in situ keratomileusis 07/25/2012   HYPERCHOLESTEROLEMIA 10/13/2010   Qualifier: Diagnosis of  By: Koleen Distance CMA (AAMA), Leisha     Hypothyroidism, postsurgical    h/o thyroid cancer s/p thyroidectomy   Iron Deficiency Anemia 10/14/2010   Qualifier: Diagnosis of  By: Jarold Motto MD Mervyn Skeeters R    Irritable bowel syndrome 10/13/2010   Qualifier: Diagnosis of  By: Koleen Distance CMA (AAMA), Leisha     Lumbar stenosis with neurogenic claudication 04/20/2016   OSA on CPAP    settings at 10-11    Type II diabetes mellitus (HCC)    type II    Past Surgical History:  Procedure Laterality Date   ABDOMINAL HYSTERECTOMY     "partial"   BIOPSY  01/15/2023   Procedure:  BIOPSY;  Surgeon: Hilarie Fredrickson, MD;  Location: [facility];  Service: Gastroenterology;;   BREAST BIOPSY Right    BREAST LUMPECTOMY Right    CARDIOVERSION N/A 03/30/2014   Procedure: CARDIOVERSION - BEDSIDE;  Surgeon: Wendall Stade, MD;  Location: [facility] OR;  Service: Cardiovascular;  Laterality: N/A;   CARDIOVERSION N/A 03/31/2014   Procedure: CARDIOVERSION  (BEDSIDE) ;  Surgeon: Lewayne Bunting, MD;  Location: [facility] OR;  Service: Cardiovascular;  Laterality: N/A;   CARDIOVERSION N/A 04/27/2014   Procedure: CARDIOVERSION;  Surgeon: Cassell Clement, MD;  Location: [facility] ENDOSCOPY;  Service: Cardiovascular;  Laterality: N/A;   CARDIOVERSION N/A 04/13/2015   Procedure: CARDIOVERSION;  Surgeon: Vesta Mixer, MD;  Location: [facility] ENDOSCOPY;  Service: Cardiovascular;  Laterality: N/A;   CARDIOVERSION N/A 01/08/2017   Procedure: CARDIOVERSION;  Surgeon: Thurmon Fair, MD;  Location: [facility] ENDOSCOPY;  Service: Cardiovascular;  Laterality: N/A;   CARDIOVERSION N/A 09/19/2017   Procedure: CARDIOVERSION;  Surgeon: Wendall Stade, MD;  Location: [facility] ENDOSCOPY;  Service: Cardiovascular;  Laterality: N/A;   CARDIOVERSION N/A 10/25/2017   Procedure: CARDIOVERSION;  Surgeon: Nahser, Deloris Ping, MD;  Location: [facility] ENDOSCOPY;  Service: Cardiovascular;  Laterality: N/A;   CARDIOVERSION N/A 12/27/2018   Procedure: CARDIOVERSION;  Surgeon: Parke Poisson, MD;  Location: [facility] ENDOSCOPY;  Service: Cardiovascular;  Laterality: N/A;   CARDIOVERSION N/A 05/20/2019   Procedure: CARDIOVERSION;  Surgeon: Parke Poisson, MD;  Location: [facility] ENDOSCOPY;  Service: Cardiovascular;  Laterality: N/A;   CARDIOVERSION N/A 12/10/2019   Procedure: CARDIOVERSION;  Surgeon: Chrystie Nose, MD;  Location: [facility] ENDOSCOPY;  Service: Cardiovascular;  Laterality: N/A;   CARDIOVERSION N/A 12/31/2019   Procedure: CARDIOVERSION;  Surgeon: Thurmon Fair, MD;  Location: [facility] ENDOSCOPY;  Service: Cardiovascular;  Laterality: N/A;   CARDIOVERSION N/A 10/21/2020    Procedure: CARDIOVERSION;  Surgeon: Parke Poisson, MD;  Location: [facility] ENDOSCOPY;  Service: Cardiovascular;  Laterality: N/A;   CARDIOVERSION N/A 12/03/2020   Procedure: CARDIOVERSION;  Surgeon: Jake Bathe, MD;  Location: [facility] ENDOSCOPY;  Service: Cardiovascular;  Laterality: N/A;   CARDIOVERSION N/A 07/11/2021   Procedure: CARDIOVERSION;  Surgeon: Chrystie Nose, MD;  Location: [facility] ENDOSCOPY;  Service: Cardiovascular;  Laterality: N/A;   CARPAL TUNNEL RELEASE Right    CATARACT EXTRACTION W/ INTRAOCULAR LENS  IMPLANT, BILATERAL Bilateral    COLONOSCOPY     COLONOSCOPY N/A 01/15/2023   Procedure: COLONOSCOPY;  Surgeon: Hilarie Fredrickson, MD;  Location: [facility] ENDOSCOPY;  Service: Gastroenterology;  Laterality: N/A;   EXCISIONAL HEMORRHOIDECTOMY     INSERTION OF MESH N/A 05/25/2017   Procedure: INSERTION OF MESH;  Surgeon: Axel Filler, MD;  Location: [facility] ORS;  Service: General;  Laterality: N/A;   TEE WITHOUT CARDIOVERSION N/A 03/30/2014   Procedure: TRANSESOPHAGEAL ECHOCARDIOGRAM (TEE);  Surgeon: Wendall Stade, MD;  Location:  [facility] ENDOSCOPY;  Service: Cardiovascular;  Laterality: N/A;   THYROIDECTOMY     TONSILLECTOMY       A IV Location/Drains/Wounds Patient Lines/Drains/Airways Status     Active Line/Drains/Airways     Name Placement date Placement time Site Days   Peripheral IV 06/03/2023 22 G 1" Left;Posterior Forearm 03-Jun-2023  2258  Forearm  1            Intake/Output Last 24 hours No intake or output data in the 24 hours ending 05/06/23 0741  Labs/Imaging Results for orders placed or performed during the hospital encounter of 06/03/2023 (from the past 48 hour(s))  CBC with Differential     Status: Abnormal   Collection Time: 06-03-2023 11:55 PM  Result Value Ref Range   WBC 12.5 (H) 4.0 - 10.5 K/uL   RBC 3.85 (L) 3.87 - 5.11 MIL/uL   Hemoglobin 12.3 12.0 - 15.0 g/dL   HCT 16.1 09.6 - 04.5 %   MCV 98.2 80.0 - 100.0 fL   MCH 31.9 26.0 - 34.0 pg   MCHC 32.5 30.0 - 36.0 g/dL    RDW 40.9 81.1 - 91.4 %   Platelets 162 150 - 400 K/uL   nRBC 0.0 0.0 - 0.2 %   Neutrophils Relative % 84 %   Neutro Abs 10.5 (H) 1.7 - 7.7 K/uL   Lymphocytes Relative 6 %   Lymphs Abs 0.7 0.7 - 4.0 K/uL   Monocytes Relative 9 %   Monocytes Absolute 1.1 (H) 0.1 - 1.0 K/uL   Eosinophils Relative 0 %   Eosinophils Absolute 0.0 0.0 - 0.5 K/uL   Basophils Relative 0 %   Basophils Absolute 0.1 0.0 - 0.1 K/uL   Immature Granulocytes 1 %   Abs Immature Granulocytes 0.13 (H) 0.00 - 0.07 K/uL    Comment: Performed at [facility], [address], [place], [place] [address]  Basic metabolic panel     Status: Abnormal   Collection Time: 06-03-2023 11:55 PM  Result Value Ref Range   Sodium 141 135 - 145 mmol/L   Potassium 2.8 (L) 3.5 - 5.1 mmol/L   Chloride 103 98 - 111 mmol/L   CO2 27 22 - 32 mmol/L   Glucose, Bld 193 (H) 70 - 99 mg/dL    Comment: Glucose reference range applies only to samples taken after fasting for at least 8 hours.   BUN 21 8 - 23 mg/dL   Creatinine, Ser 6.21 0.44 - 1.00 mg/dL   Calcium 8.2 (L) 8.9 - 10.3 mg/dL   GFR, Estimated >30 >86 mL/min    Comment: (NOTE) Calculated using the CKD-EPI Creatinine Equation (2021)    Anion gap 11 5 - 15    Comment: Performed at [facility], [address], [place], [place] [address]  Protime-INR     Status: Abnormal   Collection Time: Jun 03, 2023 11:55 PM  Result Value Ref Range   Prothrombin Time 17.2 (H) 11.4 - 15.2 seconds   INR 1.4 (H) 0.8 - 1.2    Comment: (NOTE) INR goal varies based on device and disease states. Performed at [facility], [address] [place], [place] [address]   I-stat chem 8, ED     Status: Abnormal   Collection Time: 05/06/23 12:09 AM  Result Value Ref Range   Sodium 142 135 - 145 mmol/L   Potassium 3.1 (L) 3.5 - 5.1 mmol/L   Chloride 103 98 - 111 mmol/L   BUN 24 (H) 8 -  23 mg/dL   Creatinine, Ser 1.61 0.44 - 1.00 mg/dL   Glucose, Bld  096 (H) 70 - 99 mg/dL    Comment: Glucose reference range applies only to samples taken after fasting for at least 8 hours.   Calcium, Ion 1.11 (L) 1.15 - 1.40 mmol/L   TCO2 30 22 - 32 mmol/L   Hemoglobin 12.2 12.0 - 15.0 g/dL   HCT 04.5 40.9 - 81.1 %   CT PELVIS WO CONTRAST  Result Date: 05/06/2023 CLINICAL DATA:  With pelvic fracture and possible bladder injury. EXAM: CT PELVIS WITHOUT CONTRAST TECHNIQUE: Multidetector CT imaging of the pelvis was performed following the standard protocol without intravenous contrast. RADIATION DOSE REDUCTION: This exam was performed according to the departmental dose-optimization program which includes automated exposure control, adjustment of the mA and/or kV according to patient size and/or use of iterative reconstruction technique. COMPARISON:  None Available. FINDINGS: Urinary Tract: There is contrast material within the urinary bladder. No extravasation. Bowel:  Unremarkable visualized pelvic bowel loops. Vascular/Lymphatic: Calcific aortic atherosclerosis. Reproductive:  Status post hysterectomy. Other:  None Musculoskeletal: L4-5 PLIF. Minimally displaced fracture of the right sacral wing. Comminuted fractures of the inferior and superior pubic rami bilaterally IMPRESSION: 1. Minimally displaced fracture of the right sacral wing. 2. No evidence of bladder injury. 3. Comminuted fractures of the inferior and superior pubic rami bilaterally. Aortic Atherosclerosis (ICD10-I70.0). Electronically Signed   By: Deatra Robinson M.D.   On: 05/06/2023 02:54   CT CHEST ABDOMEN PELVIS W CONTRAST  Result Date: 05/06/2023 CLINICAL DATA:  Fall with back pain and pelvis pain EXAM: CT CHEST, ABDOMEN, AND PELVIS WITH CONTRAST TECHNIQUE: Multidetector CT imaging of the chest, abdomen and pelvis was performed following the standard protocol during bolus administration of intravenous contrast. RADIATION DOSE REDUCTION: This exam was performed according to the departmental  dose-optimization program which includes automated exposure control, adjustment of the mA and/or kV according to patient size and/or use of iterative reconstruction technique. CONTRAST:  OMNIPAQUE IOHEXOL 300 MG/ML  SOLN COMPARISON:  Chest radiograph 05/06/2023; CTA chest 01/21/2023; CT abdomen and pelvis 01/10/2023 FINDINGS: CT CHEST FINDINGS Cardiovascular: Cardiomegaly. No pericardial effusion. No acute aortic injury. No central pulmonary embolism. Coronary artery and aortic atherosclerotic calcification. Mediastinum/Nodes: Patulous esophagus and small hiatal hernia with distal esophageal wall thickening. Trachea is unremarkable. No mediastinal hematoma. No thoracic adenopathy. Lungs/Pleura: Low-density small right pleural effusion. No left pleural effusion. No pneumothorax. Respiratory motion obscures fine detail. Atelectasis, architectural distortion, and bronchiolectasis in the right middle and lower lobes. Pneumonia is difficult to exclude. Subtle mosaic attenuation of the lungs compatible with air trapping. Musculoskeletal: No acute fracture or destructive osseous lesion. Multiple subacute or chronic right rib fractures. CT ABDOMEN PELVIS FINDINGS Hepatobiliary: No evidence of liver injury. Unremarkable gallbladder and biliary tree. Pancreas: Fatty atrophy.  No acute abnormality. Spleen: No splenic injury or perisplenic hematoma. Adrenals/Urinary Tract: No adrenal hemorrhage or renal injury identified. Unchanged indeterminate 1.1 cm left upper pole lesion. There are blood products in the anterior pelvis confluence with the anterior bladder wall (circa series 4/image 112) and bladder injury is not excluded. Stomach/Bowel: Normal caliber large and small bowel. Colonic diverticulosis without diverticulitis. No bowel wall thickening. Stomach is within normal limits. Vascular/Lymphatic: Aortic atherosclerosis. No enlarged abdominal or pelvic lymph nodes. Reproductive: No acute abnormality. Other:  Extraperitoneal blood products in the pelvis. No free intraperitoneal air. Musculoskeletal: Posterior fusion L4-L5. Bilateral comminuted mildly displaced fractures of the inferior and superior pubic rami fractures extend into the pubic  body bilaterally. Adjacent hyperdense fluid compatible with hemorrhage. Some of the hemorrhage is confluent with the anterior wall of the bladder (circa series 4/image 112). Bladder wall injury is difficult to exclude. IMPRESSION: 1. Bilateral comminuted mildly displaced fractures of the inferior and superior pubic rami extending into the pubic body bilaterally. Adjacent extraperitoneal blood products in the pelvis. Some of the hemorrhage is confluent with the anterior wall of the bladder and bladder injury is not excluded. Unfortunately delayed images do not extend through the pelvis. This could be further evaluated with repeat noncontrast CT of the pelvis with attention for extraluminal excreted contrast outside the bladder. 2. Small low-density right pleural effusion. No pneumothorax. 3. Patulous esophagus and small hiatal hernia with distal esophageal wall thickening. Correlate for esophagitis. 4. Unchanged 1.1 cm indeterminate left upper pole renal lesion. If appropriate given life expectancy and comorbidities consider nonemergent contrast-enhanced MRI for further characterization. These results were called by telephone at the time of interpretation on 05/06/2023 at 1:31 am to provider JOSHUA LONG , who verbally acknowledged these results. Electronically Signed   By: Minerva Fester M.D.   On: 05/06/2023 01:31   CT Head Wo Contrast  Result Date: 05/06/2023 CLINICAL DATA:  Head trauma, moderate-severe; Neck trauma (Age >= 65y) EXAM: CT HEAD WITHOUT CONTRAST CT CERVICAL SPINE WITHOUT CONTRAST TECHNIQUE: Multidetector CT imaging of the head and cervical spine was performed following the standard protocol without intravenous contrast. Multiplanar CT image reconstructions of the  cervical spine were also generated. RADIATION DOSE REDUCTION: This exam was performed according to the departmental dose-optimization program which includes automated exposure control, adjustment of the mA and/or kV according to patient size and/or use of iterative reconstruction technique. COMPARISON:  CT head 12/05/2021, MRI cervical spine 11/26/2018 FINDINGS: CT HEAD FINDINGS Brain: Cerebral ventricle sizes are concordant with the degree of cerebral volume loss. No evidence of large-territorial acute infarction. No parenchymal hemorrhage. No mass lesion. No extra-axial collection. No mass effect or midline shift. No hydrocephalus. Basilar cisterns are patent. Vascular: No hyperdense vessel. Atherosclerotic calcifications are present within the cavernous internal carotid and vertebral arteries. Skull: No acute fracture or focal lesion. Temporomandibular joint degenerative changes. Sinuses/Orbits: Paranasal sinuses and mastoid air cells are clear. Bilateral lens replacement. Otherwise the orbits are unremarkable. Other: None. CT CERVICAL SPINE FINDINGS Alignment: Stable grade 1 anterolisthesis of C5 on C6. Skull base and vertebrae: Multilevel severe degenerative changes spine. Moderate to severe left C3-C4 osseous neural foraminal stenosis. No severe osseous central canal stenosis. No acute fracture. No aggressive appearing focal osseous lesion or focal pathologic process. Soft tissues and spinal canal: No prevertebral fluid or swelling. No visible canal hematoma. Upper chest: Unremarkable. Other: None. IMPRESSION: 1. No acute intracranial abnormality. 2. No acute displaced fracture or traumatic listhesis of the cervical spine. Electronically Signed   By: Tish Frederickson M.D.   On: 05/06/2023 01:12   CT Cervical Spine Wo Contrast  Result Date: 05/06/2023 CLINICAL DATA:  Head trauma, moderate-severe; Neck trauma (Age >= 65y) EXAM: CT HEAD WITHOUT CONTRAST CT CERVICAL SPINE WITHOUT CONTRAST TECHNIQUE:  Multidetector CT imaging of the head and cervical spine was performed following the standard protocol without intravenous contrast. Multiplanar CT image reconstructions of the cervical spine were also generated. RADIATION DOSE REDUCTION: This exam was performed according to the departmental dose-optimization program which includes automated exposure control, adjustment of the mA and/or kV according to patient size and/or use of iterative reconstruction technique. COMPARISON:  CT head 12/05/2021, MRI cervical spine 11/26/2018 FINDINGS: CT HEAD  FINDINGS Brain: Cerebral ventricle sizes are concordant with the degree of cerebral volume loss. No evidence of large-territorial acute infarction. No parenchymal hemorrhage. No mass lesion. No extra-axial collection. No mass effect or midline shift. No hydrocephalus. Basilar cisterns are patent. Vascular: No hyperdense vessel. Atherosclerotic calcifications are present within the cavernous internal carotid and vertebral arteries. Skull: No acute fracture or focal lesion. Temporomandibular joint degenerative changes. Sinuses/Orbits: Paranasal sinuses and mastoid air cells are clear. Bilateral lens replacement. Otherwise the orbits are unremarkable. Other: None. CT CERVICAL SPINE FINDINGS Alignment: Stable grade 1 anterolisthesis of C5 on C6. Skull base and vertebrae: Multilevel severe degenerative changes spine. Moderate to severe left C3-C4 osseous neural foraminal stenosis. No severe osseous central canal stenosis. No acute fracture. No aggressive appearing focal osseous lesion or focal pathologic process. Soft tissues and spinal canal: No prevertebral fluid or swelling. No visible canal hematoma. Upper chest: Unremarkable. Other: None. IMPRESSION: 1. No acute intracranial abnormality. 2. No acute displaced fracture or traumatic listhesis of the cervical spine. Electronically Signed   By: Tish Frederickson M.D.   On: 05/06/2023 01:12   DG Hip Unilat W or Wo Pelvis 2-3 Views  Right  Result Date: 05/06/2023 CLINICAL DATA:  Status post fall. EXAM: DG HIP (WITH OR WITHOUT PELVIS) 2-3V RIGHT COMPARISON:  None Available. FINDINGS: Acute fracture deformities are suspected within the regions of the right superior and right inferior pubic rami. There is no evidence of dislocation. Marked severity degenerative changes are seen involving the right hip, in the form of joint space narrowing and acetabular sclerosis. IMPRESSION: Acute fracture deformities of the right superior and right inferior pubic rami. CT correlation is recommended. Electronically Signed   By: Aram Candela M.D.   On: 05/06/2023 00:43   DG Knee Complete 4 Views Right  Result Date: 05/06/2023 CLINICAL DATA:  Status post fall. EXAM: RIGHT KNEE - COMPLETE 4+ VIEW COMPARISON:  None Available. FINDINGS: No evidence of an acute fracture or dislocation. Medial and lateral marginal osteophyte formation is seen. There is marked severity patellofemoral, medial tibiofemoral and lateral tibiofemoral compartment space narrowing. Medial and lateral chondrocalcinosis is also present. A small joint effusion is suspected. IMPRESSION: 1. Marked severity tricompartmental degenerative changes. 2. Small joint effusion. Electronically Signed   By: Aram Candela M.D.   On: 05/06/2023 00:39   DG Chest Portable 1 View  Result Date: 05/06/2023 CLINICAL DATA:  Status post fall. EXAM: PORTABLE CHEST 1 VIEW COMPARISON:  February 27, 2023 FINDINGS: The cardiac silhouette is mildly enlarged. There is marked severity calcification of the aortic arch. Mild right perihilar, right infrahilar and right basilar atelectasis and/or infiltrate is noted. There is no evidence of a pleural effusion or pneumothorax. Radiopaque surgical clips are seen along the lateral aspect of the upper right chest wall. The visualized skeletal structures are unremarkable. IMPRESSION: Mild right perihilar, right infrahilar and right basilar atelectasis and/or infiltrate.  Electronically Signed   By: Aram Candela M.D.   On: 05/06/2023 00:29    Pending Labs Unresulted Labs (From admission, onward)    None       Vitals/Pain Today's Vitals   05/06/23 0452 05/06/23 0625 05/06/23 0630 05/06/23 0645  BP:   122/87   Pulse:   (!) 102 85  Resp:   (!) 28 (!) 25  Temp:  98 F (36.7 C)    TempSrc:  Oral    SpO2: 93%  95% 91%  PainSc:        Isolation Precautions No active isolations  Medications Medications  fentaNYL (SUBLIMAZE) injection 25 mcg (25 mcg Intravenous Given May 18, 2023 2342)  diltiazem (CARDIZEM) 125 mg in dextrose 5% 125 mL (1 mg/mL) infusion (10 mg/hr Intravenous Rate/Dose Change 05/06/23 0651)  diltiazem (CARDIZEM CD) 24 hr capsule 120 mg (has no administration in time range)  rosuvastatin (CRESTOR) tablet 10 mg (has no administration in time range)  metoprolol tartrate (LOPRESSOR) tablet 100 mg (has no administration in time range)  LORazepam (ATIVAN) tablet 0.5 mg (has no administration in time range)  sertraline (ZOLOFT) tablet 50 mg (has no administration in time range)  levothyroxine (SYNTHROID) tablet 150 mcg (has no administration in time range)  pantoprazole (PROTONIX) EC tablet 40 mg (has no administration in time range)  apixaban (ELIQUIS) tablet 5 mg (has no administration in time range)  ferrous sulfate tablet 325 mg (has no administration in time range)  potassium chloride SA (KLOR-CON M) CR tablet 40 mEq (has no administration in time range)  acetaminophen (TYLENOL) tablet 650 mg (has no administration in time range)    Or  acetaminophen (TYLENOL) suppository 650 mg (has no administration in time range)  oxyCODONE (Oxy IR/ROXICODONE) immediate release tablet 5 mg (has no administration in time range)  morphine (PF) 2 MG/ML injection 2 mg (has no administration in time range)  docusate sodium (COLACE) capsule 100 mg (has no administration in time range)  polyethylene glycol (MIRALAX / GLYCOLAX) packet 17 g (has no  administration in time range)  ondansetron (ZOFRAN) tablet 4 mg (has no administration in time range)    Or  ondansetron (ZOFRAN) injection 4 mg (has no administration in time range)  albuterol (PROVENTIL) (2.5 MG/3ML) 0.083% nebulizer solution 2.5 mg (has no administration in time range)  metoprolol tartrate (LOPRESSOR) injection 5 mg (has no administration in time range)  sodium chloride 0.9 % bolus 500 mL (0 mLs Intravenous Stopped 18-May-2023 2342)  iohexol (OMNIPAQUE) 300 MG/ML solution 100 mL (100 mLs Intravenous Contrast Given 05/06/23 0056)  diazepam (VALIUM) injection 2.5 mg (2.5 mg Intravenous Given 05/06/23 0136)  sodium chloride 0.9 % bolus 500 mL (0 mLs Intravenous Stopped 05/06/23 0240)    Mobility non-ambulatory     Focused Assessments Cardiac Assessment Handoff:    Lab Results  Component Value Date   CKTOTAL 166 12/13/2022   TROPONINI <0.30 03/29/2014   Lab Results  Component Value Date   DDIMER 13.17 (H) 01/21/2023   Does the Patient currently have chest pain? No    R Recommendations: See Admitting Provider Note  Report given to:   Additional Notes:

## 2023-05-06 NOTE — ED Notes (Signed)
Pt sats dropped to 87% on 5L [place], pt normally wears 2L PRN. Pt bumped up to 6L [place] sating between 91-94%

## 2023-05-06 NOTE — ED Notes (Signed)
Pt transported to CT on monitor.

## 2023-05-07 DIAGNOSIS — E890 Postprocedural hypothyroidism: Secondary | ICD-10-CM | POA: Diagnosis not present

## 2023-05-07 DIAGNOSIS — E119 Type 2 diabetes mellitus without complications: Secondary | ICD-10-CM | POA: Diagnosis not present

## 2023-05-07 DIAGNOSIS — S32810A Multiple fractures of pelvis with stable disruption of pelvic ring, initial encounter for closed fracture: Secondary | ICD-10-CM | POA: Diagnosis not present

## 2023-05-07 DIAGNOSIS — I4819 Other persistent atrial fibrillation: Secondary | ICD-10-CM

## 2023-05-07 LAB — GLUCOSE, CAPILLARY
Glucose-Capillary: 181 mg/dL — ABNORMAL HIGH (ref 70–99)
Glucose-Capillary: 167 mg/dL — ABNORMAL HIGH (ref 70–99)

## 2023-05-07 MED ORDER — METOPROLOL TARTRATE 5 MG/5ML IV SOLN
5.0000 mg | INTRAVENOUS | Status: DC | PRN
  Administered 2023-05-07 – 2023-05-11 (×6): 5 mg via INTRAVENOUS
  Filled 2023-05-07 (×6): qty 5

## 2023-05-07 MED ORDER — INSULIN ASPART 100 UNIT/ML IJ SOLN
0.0000 [IU] | Freq: Three times a day (TID) | INTRAMUSCULAR | Status: DC
  Administered 2023-05-07 – 2023-05-08 (×2): 2 [IU] via SUBCUTANEOUS
  Administered 2023-05-08: 1 [IU] via SUBCUTANEOUS
  Administered 2023-05-09 (×2): 2 [IU] via SUBCUTANEOUS

## 2023-05-07 MED ORDER — DILTIAZEM HCL-DEXTROSE 125-5 MG/125ML-% IV SOLN (PREMIX)
5.0000 mg/h | INTRAVENOUS | Status: DC
  Administered 2023-05-07: 5 mg/h via INTRAVENOUS
  Administered 2023-05-08 (×2): 10 mg/h via INTRAVENOUS
  Filled 2023-05-07 (×4): qty 125

## 2023-05-07 MED ORDER — DILTIAZEM HCL ER COATED BEADS 120 MG PO CP24
120.0000 mg | ORAL_CAPSULE | Freq: Every day | ORAL | Status: DC
  Administered 2023-05-07 – 2023-05-09 (×3): 120 mg via ORAL
  Filled 2023-05-07 (×3): qty 1

## 2023-05-07 MED ORDER — ORAL CARE MOUTH RINSE: 15.0000 mL | OROMUCOSAL | Status: DC | PRN

## 2023-05-07 MED ORDER — INSULIN ASPART 100 UNIT/ML IJ SOLN: 0.0000 [IU] | Freq: Every day | INTRAMUSCULAR | Status: DC

## 2023-05-07 MED ORDER — METOPROLOL TARTRATE 5 MG/5ML IV SOLN
5.0000 mg | Freq: Once | INTRAVENOUS | Status: AC
  Administered 2023-05-07: 5 mg via INTRAVENOUS
  Filled 2023-05-07: qty 5

## 2023-05-07 NOTE — Assessment & Plan Note (Signed)
Continue Protonix °

## 2023-05-07 NOTE — Hospital Course (Signed)
[name] is a 89 year old female with PMH A-fib on Eliquis, DM II, HTN, HLD, hypothyroidism who presented to the hospital after a witnessed and mechanical fall on 05/05/2023.  Patient was attempting to stand up and had lost footing falling to the ground. She underwent multiple imaging studies on workup in the ER.  She was also noted to be in A-fib with RVR and was initiated on a Cardizem drip. Imaging studies showed a bilateral comminuted mildly displaced fractures of the inferior and superior pubic rami extending into the pubic body bilaterally.  There was also noted to be adjacent extraperitoneal blood products in the pelvis. Eliquis was held on admission.  Orthopedic surgery was also consulted and recommended nonoperative management with supportive care and weightbearing as tolerated.

## 2023-05-07 NOTE — TOC Initial Note (Signed)
Transition of Care [facility]) - Initial/Assessment Note    Patient Details  Name: [name] MRN: [id number] Date of Birth: May 24, 1931  [facility]) CM/SW Contact:    Howell Rucks, RN Phone Number: 05/07/2023, 8:23 AM  Clinical Narrative:  PT eval pending, await recommendation                       Patient Goals and CMS Choice            Expected Discharge Plan and Services                                              Prior Living Arrangements/Services                       Activities of Daily Living Home Assistive Devices/Equipment: Eyeglasses, Cane (specify quad or straight), Oxygen, Blood pressure cuff, Bedside commode/3-in-1, Wheelchair, Environmental consultant (specify type) ADL Screening (condition at time of admission) Patient's cognitive ability adequate to safely complete daily activities?: Yes Is the patient deaf or have difficulty hearing?: Yes Does the patient have difficulty seeing, even when wearing glasses/contacts?: No Does the patient have difficulty concentrating, remembering, or making decisions?: Yes (at times) Patient able to express need for assistance with ADLs?: Yes Does the patient have difficulty dressing or bathing?: Yes Independently performs ADLs?: No Communication: Independent Dressing (OT): Needs assistance Is this a change from baseline?: Pre-admission baseline Grooming: Needs assistance Is this a change from baseline?: Pre-admission baseline Feeding: Independent Bathing: Needs assistance Is this a change from baseline?: Pre-admission baseline Toileting: Needs assistance Is this a change from baseline?: Pre-admission baseline In/Out Bed: Needs assistance Is this a change from baseline?: Pre-admission baseline Walks in Home: Independent with device (comment), Needs assistance Is this a change from baseline?: Pre-admission baseline Does the patient have difficulty walking or climbing stairs?: Yes Weakness of Legs:  Both Weakness of Arms/Hands: Both  Permission Sought/Granted                  Emotional Assessment              Admission diagnosis:  Pelvic fracture (HCC) [S32.9XXA] Atrial fibrillation with RVR (HCC) [I48.91] Fall, initial encounter [W19.XXXA] Closed nondisplaced fracture of pelvis, unspecified part of pelvis, initial encounter (HCC) [S32.9XXA] Patient Active Problem List   Diagnosis Date Noted   Pelvic fracture (HCC) 05/06/2023   Malnutrition of moderate degree 01/23/2023   Acute hypoxic respiratory failure (HCC) 01/21/2023   Chronic diastolic CHF (congestive heart failure) (HCC) 01/21/2023   Tachypnea 01/21/2023   Acute on chronic diastolic heart failure (HCC) 01/13/2023   Chronic diarrhea 01/12/2023   Generalized weakness 01/10/2023   Depression with anxiety 01/10/2023   Pleural effusion on right 01/10/2023   Lesion of left native kidney 01/10/2023   Physical deconditioning 12/13/2022   Hypokalemia 12/13/2022   Diarrhea 12/13/2022   Fall 12/12/2022   Atypical atrial flutter (HCC) 06/13/2021   Secondary hypercoagulable state (HCC) 06/13/2021   A-fib (HCC)    Dilated cardiomyopathy (HCC) 08/27/2018   Acute bronchitis 11/15/2017   S/P Nissen fundoplication (without gastrostomy tube) procedure 05/25/2017   Persistent atrial fibrillation (HCC)    Chronic cough 01/02/2017   Lumbar stenosis with neurogenic claudication 04/20/2016   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   Malignant neoplasm of breast (HCC) 02/25/2016   AION (  anterior ischemic optic neuropathy) 02/25/2016   Atrial fibrillation with RVR (HCC) 03/31/2015   Post-surgical hypothyroidism 04/09/2014   Anticoagulated by anticoagulation treatment - Eliquis 03/30/2014   DMII (diabetes mellitus, type 2) (HCC)    Heart murmur    DJD (degenerative joint disease) of knee    Glaucoma    History of laser assisted in situ keratomileusis 07/25/2012   Degenerative disorder of eye 07/25/2012   Exotropia, intermittent  07/09/2012   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   Error, refractive, myopia 07/09/2012   ANEMIA, IRON DEFICIENCY 10/14/2010   GERD 10/14/2010   THYROID CANCER 10/13/2010   Hyperlipidemia associated with type 2 diabetes mellitus (HCC) 10/13/2010   ANEMIA, UNSPECIFIED 10/13/2010   Anxiety state 10/13/2010   MIGRAINE HEADACHE 10/13/2010   Essential hypertension 10/13/2010   EROSIVE ESOPHAGITIS 10/13/2010   BARRETTS ESOPHAGUS 10/13/2010   Hiatal hernia s/p robotic repair & fundoplication 05/25/2017 10/13/2010   Diverticulosis of colon without hemorrhage 10/13/2010   Irritable bowel syndrome 10/13/2010   OSTEOARTHRITIS 10/13/2010   CARDIAC MURMUR 10/13/2010   COLONIC POLYPS, ADENOMATOUS, HX OF 10/13/2010   PCP:  Joya Martyr, MD Pharmacy:   [id number] [place], [place] - [address] AT [facility] & [address] [place] [address] Phone: [phone] Fax: [phone]  [facility] - [place], [place] - [address] [place] [address] Phone: [phone] Fax: [phone]  [facility] [address] [place] - [address] [place] [address] Phone: [phone] Fax: [phone]  [place] [facility] [address] [place] [address] Phone: [phone] Fax: [phone]     Social Determinants of Health (SDOH) Social History: SDOH Screenings   Food Insecurity: No Food Insecurity (05/06/2023)  Housing: Low Risk  (05/06/2023)  Transportation Needs: No Transportation Needs (05/06/2023)  Utilities: Not At Risk (05/06/2023)  Tobacco Use: Low Risk  (04/30/2023)   SDOH Interventions:     Readmission Risk Interventions    01/15/2023    8:56 AM  Readmission Risk Prevention Plan  Transportation Screening Complete  PCP or Specialist Appt within 3-5 Days Complete  HRI or Home Care Consult Complete  Social Work Consult for  Recovery Care Planning/Counseling Complete  Palliative Care Screening Not Applicable  Medication Review Oceanographer) Complete

## 2023-05-07 NOTE — Progress Notes (Addendum)
Progress Note    COZY VEALE   ZOX:[id number]  DOB: 1931-02-08  DOA: 05/06/2023     1 PCP: Joya Martyr, MD  Initial CC: fall at home  Hospital Course: [name] is a 89 year old female with PMH A-fib on Eliquis, DM II, HTN, HLD, hypothyroidism who presented to the hospital after a witnessed and mechanical fall on 05/03/2023.  Patient was attempting to stand up and had lost footing falling to the ground. She underwent multiple imaging studies on workup in the ER.  She was also noted to be in A-fib with RVR and was initiated on a Cardizem drip. Imaging studies showed a bilateral comminuted mildly displaced fractures of the inferior and superior pubic rami extending into the pubic body bilaterally.  There was also noted to be adjacent extraperitoneal blood products in the pelvis. Eliquis was held on admission.  Orthopedic surgery was also consulted and recommended nonoperative management with supportive care and weightbearing as tolerated.  Interval History:  Seen this morning with caretaker present bedside.  Patient was somnolent but awakens easily.  Follows commands and does moan out in pain with minimal manipulation of lower extremities.  Assessment and Plan: * Pelvic fracture (HCC) - S/p mechanical fall at home.  Imaging studies show "Bilateral comminuted mildly displaced fractures of the inferior and superior pubic rami extending into the pubic body bilaterally. Adjacent extraperitoneal blood products in the pelvis" -Weightbearing as tolerated per orthopedic surgery.  No operative management necessary -PT/OT evals.  Family still wishes to bring patient back home with ongoing caretakers -Pain control -Outpatient follow-up with Dr. Linna [name] in 2 to 3 weeks for repeat imaging  Persistent atrial fibrillation (HCC) - Patient noted to be in RVR on admission likely pain induced from traumatic fractures - Initially started on Cardizem drip.  Resuming home Cardizem and Lopressor  and will discontinue Cardizem drip for now and monitor response -Eliquis on hold in setting of extraperitoneal hemorrhage; initial Hgb 12.3 g/dL - repeat Hgb and monitor  -Repeating thyroid studies as recent TSH was almost suppressed  DMII (diabetes mellitus, type 2) (HCC) - A1c 7.3%; previously 5.9% about 4 months ago - Start SSI and CBG monitoring  Post-surgical hypothyroidism - Previously had elevated TSH and was felt due to noncompliance due to forgetfulness.  She has now been having caretakers and living with family - Most recent TSH now 02/21/2023 and is normal range, 2.290 -Continue Synthroid  Depression with anxiety - Continue Zoloft  Physical deconditioning - at risk for being bed bound with progressive decline - family wishing for taking patient home at d/c with ongoing caregivers   GERD - Continue Protonix  Essential hypertension - Continue Cardizem and Lopressor   Old records reviewed in assessment of this patient  Antimicrobials:   DVT prophylaxis:  SCDs Start: 05/06/23 0735   Code Status:   Code Status: Full Code  Mobility Assessment (last 72 hours)     Mobility Assessment     Row Name 05/07/23 8119 05/06/23 2145 05/06/23 0902       Does patient have an order for bedrest or is patient medically unstable -- Yes- Bedfast (Level 1) - Complete Yes- Bedfast (Level 1) - Complete     What is the highest level of mobility based on the progressive mobility assessment? Level 1 (Bedfast) - Unable to balance while sitting on edge of bed -- --              Barriers to discharge: none Disposition Plan:  Home  with HH and caretakers Status is: Inpt  Objective: Blood pressure 118/83, pulse 82, temperature 97.7 F (36.5 C), temperature source Oral, resp. rate 20, height 5\' 6"  (1.676 m), weight 60.9 kg, SpO2 96 %.  Examination:  Physical Exam Constitutional:      Comments: Pleasant elderly woman lying in bed in no distress but appears uncomfortable with  lower extremity movements  HENT:     Head: Normocephalic and atraumatic.     Mouth/Throat:     Mouth: Mucous membranes are moist.  Eyes:     Extraocular Movements: Extraocular movements intact.  Cardiovascular:     Rate and Rhythm: Normal rate. Rhythm irregular.  Pulmonary:     Effort: Pulmonary effort is normal. No respiratory distress.     Breath sounds: Normal breath sounds. No wheezing.  Abdominal:     General: Bowel sounds are normal. There is no distension.     Palpations: Abdomen is soft.     Tenderness: There is no abdominal tenderness.  Musculoskeletal:        General: Normal range of motion.     Cervical back: Normal range of motion and neck supple.  Skin:    General: Skin is warm and dry.  Neurological:     Comments: Follows commands, moves all 4 extremities      Consultants:  Orthopedic surgery  Procedures:    Data Reviewed: No results found for this or any previous visit (from the past 24 hour(s)).  I have reviewed pertinent nursing notes, vitals, labs, and images as necessary. I have ordered labwork to follow up on as indicated.  I have reviewed the last notes from staff over past 24 hours. I have discussed patient's care plan and test results with nursing staff, CM/SW, and other staff as appropriate.  Time spent: Greater than 50% of the 55 minute visit was spent in counseling/coordination of care for the patient as laid out in the A&P.   LOS: 1 day   Natasha Chamber, MD [facility] 05/07/2023, 12:09 PM

## 2023-05-07 NOTE — H&P (Deleted)
  Diagnosis code: S32.9XXA      Height: 5' 6"   Weight: 133#      Patient has Bilateral Pelvic Fractures with a recent fall which requires lower extremities to be positioned in ways not feasible with a normal bed. Requires frequent and immediate changes in body position which cannot be achieved with a normal bed.     

## 2023-05-07 NOTE — Plan of Care (Signed)
  Problem: Safety: Goal: Ability to remain free from injury will improve Outcome: Progressing   Problem: Skin Integrity: Goal: Risk for impaired skin integrity will decrease Outcome: Progressing   Problem: Pain Managment: Goal: General experience of comfort will improve Outcome: Progressing   Problem: Coping: Goal: Level of anxiety will decrease Outcome: Progressing   

## 2023-05-07 NOTE — Progress Notes (Signed)
  Diagnosis code: S32.9XXA      Height: 5\' 6"    Weight: 133#      Patient has Bilateral Pelvic Fractures with a recent fall which requires lower extremities to be positioned in ways not feasible with a normal bed. Requires frequent and immediate changes in body position which cannot be achieved with a normal bed.

## 2023-05-07 NOTE — Assessment & Plan Note (Addendum)
-   S/p mechanical fall at home.  Imaging studies show "Bilateral comminuted mildly displaced fractures of the inferior and superior pubic rami extending into the pubic body bilaterally. Adjacent extraperitoneal blood products in the pelvis" -Weightbearing as tolerated per orthopedic surgery.  No operative management necessary -PT/OT evals.  Family still wishes to bring patient back home with ongoing caretakers -Pain control -Outpatient follow-up with Dr. Linna Caprice in 2 to 3 weeks for repeat imaging

## 2023-05-07 NOTE — Assessment & Plan Note (Signed)
Continue Zoloft 

## 2023-05-07 NOTE — Evaluation (Signed)
Physical Therapy Evaluation Patient Details Name: [name] MRN: [id number] DOB: 07-15-1931 Today's Date: 05/07/2023  History of Present Illness  89 yo female admitted with bil superior/inferior pubic rami fractures. Hx of DM, lumbar stenosis, Afib, breast cancer, cervical cancer, OSA, glaucoma  Clinical Impression  On eval, pt required Total assist +2 for bed mobility. Pt sat at EOB for a few minutes before being returned to supine. Heavy posterior lean with inability to shift weight anteriorly/maintain static sitting balance. Mobility is significantly limited by pain. Caregiver present during session-reports pt has 24/7 care at baseline. Will plan to follow and progress activity as pt is able to tolerate. Pt will benefit from continued rehab after this hospital stay.        Recommendations for follow up therapy are one component of a multi-disciplinary discharge planning process, led by the attending physician.  Recommendations may be updated based on patient status, additional functional criteria and insurance authorization.  Follow Up Recommendations Can patient physically be transported by private vehicle: No     Assistance Recommended at Discharge Frequent or constant Supervision/Assistance  Patient can return home with the following  Assistance with cooking/housework;Assist for transportation;Help with stairs or ramp for entrance;Two people to help with walking and/or transfers;Two people to help with bathing/dressing/bathroom    Equipment Recommendations None recommended by PT  Recommendations for Other Services  OT consult    Functional Status Assessment Patient has had a recent decline in their functional status and demonstrates the ability to make significant improvements in function in a reasonable and predictable amount of time.     Precautions / Restrictions Precautions Precautions: Fall Restrictions Weight Bearing Restrictions: No RLE Weight Bearing: Weight  bearing as tolerated LLE Weight Bearing: Weight bearing as tolerated      Mobility  Bed Mobility Overal bed mobility: Needs Assistance Bed Mobility: Supine to Sit, Sit to Supine     Supine to sit: Total assist, +2 for physical assistance, +2 for safety/equipment, HOB elevated Sit to supine: Total assist, +2 for physical assistance, +2 for safety/equipment   General bed mobility comments: Utilized bedpad for positioning at [facility] ("helicopter spin"). Poor sitting balance with pt pushing into extension/unable to sit fully upright/shift weight anteriorly. Eyes closed mostly-pt will briefly open them when cued.    Transfers                   General transfer comment: NT-not yet able 2* pain    Ambulation/Gait                  Stairs            Wheelchair Mobility    Modified Rankin (Stroke Patients Only)       Balance Overall balance assessment: History of Falls, Needs assistance Sitting-balance support: Bilateral upper extremity supported Sitting balance-Leahy Scale: Zero Sitting balance - Comments: Heavy +2 assist to maintain static sitting balance                                     Pertinent Vitals/Pain Pain Assessment Pain Assessment: Faces Faces Pain Scale: Hurts worst Pain Descriptors / Indicators: Grimacing, Moaning (yells out) Pain Intervention(s): Limited activity within patient's tolerance, Premedicated before session, Repositioned    Home Living Family/patient expects to be discharged to:: Private residence Living Arrangements: Alone Available Help at Discharge: Personal care attendant;Available 24 hours/day;Friend(s);Family Type of Home: House Home Access: Stairs to  enter Entrance Stairs-Rails: Left Entrance Stairs-Number of Steps: 3-4   Home Layout: One level Home Equipment: Toilet riser;Tub bench;Cane - single point;Rollator (4 wheels);Standard Walker;BSC/3in1;Wheelchair - manual Additional Comments: info taken  from previous admission    Prior Function Prior Level of Function : Needs assist             Mobility Comments: Pt ambulates in house with supervision and use of a walker ADLs Comments: Pt has aide that assists with IADLs (cleaning, occasional assist w/ meals). She can do ADLs w/o assist but aide will help with a sponge bath when she is there too (1x/wk) sits on commode for sponge bath. She has assist for transportation to stores and appointments. She has meals on wheels M-F for lunch. Daughter brings food for microwave and aide helps with food when there.     Hand Dominance   Dominant Hand: Right    Extremity/Trunk Assessment   Upper Extremity Assessment Upper Extremity Assessment: Defer to OT evaluation    Lower Extremity Assessment Lower Extremity Assessment:  (unable to assess 2*pain)       Communication   Communication: No difficulties  Cognition Arousal/Alertness:  (tends to keep eyes closed. responds to verbal stiumuli and movement) Behavior During Therapy: Flat affect Overall Cognitive Status: Difficult to assess                                          General Comments      Exercises     Assessment/Plan    PT Assessment Patient needs continued PT services  PT Problem List Decreased strength;Decreased range of motion;Decreased activity tolerance;Decreased balance;Pain;Decreased cognition;Decreased knowledge of use of DME       PT Treatment Interventions DME instruction;Therapeutic exercise;Balance training;Gait training;Functional mobility training;Therapeutic activities;Patient/family education    PT Goals (Current goals can be found in the Care Plan section)  Acute Rehab PT Goals Patient Stated Goal: pt unable to state. PT Goal Formulation: Patient unable to participate in goal setting Time For Goal Achievement: 05/21/23 Potential to Achieve Goals: Good    Frequency Min 1X/week     Co-evaluation               AM-PAC PT  "6 Clicks" Mobility  Outcome Measure Help needed turning from your back to your side while in a flat bed without using bedrails?: Total Help needed moving from lying on your back to sitting on the side of a flat bed without using bedrails?: Total Help needed moving to and from a bed to a chair (including a wheelchair)?: Total Help needed standing up from a chair using your arms (e.g., wheelchair or bedside chair)?: Total Help needed to walk in hospital room?: Total Help needed climbing 3-5 steps with a railing? : Total 6 Click Score: 6    End of Session   Activity Tolerance: Patient limited by pain Patient left: in bed;with call bell/phone within reach;with bed alarm set;with family/visitor present   PT Visit Diagnosis: Pain;Other abnormalities of gait and mobility (R26.89);Muscle weakness (generalized) (M62.81)    Time: 1610-9604 PT Time Calculation (min) (ACUTE ONLY): 15 min   Charges:   PT Evaluation $PT Eval Low Complexity: 1 Low            Faye Ramsay, PT Acute Rehabilitation  Office: [phone]

## 2023-05-07 NOTE — Evaluation (Signed)
Occupational Therapy Evaluation Patient Details Name: [name] MRN: [id number] DOB: 1931/08/03 Today's Date: 05/07/2023   History of Present Illness Patient is a 89 year old female who was admitted with bil superior/inferior pubic rami fractures after a fall at home. Hx of DM, lumbar stenosis, Afib, breast cancer, cervical cancer, OSA, glaucoma   Clinical Impression   Patient is a 89 year old female who was admitted for above. Patient was living at home with 24/7 caregiver support as noted below at rolling walker level. Currently, patients pain is severely limiting ability to participate in Adls.  Patient was +2 TD for sitting EOB with patient calling out in pain and pushing posteriorly. Patient was noted to have decreased functional activity tolernace, decreased ROM, decreased BUE strength, decreased endurance, decreased sitting balance, decreased standing balanced, decreased safety awareness, and decreased knowledge of AE/AD impacting participation in ADLs. Patient would continue to benefit from skilled OT services at this time while admitted and after d/c to address noted deficits in order to improve overall safety and independence in ADLs.       Recommendations for follow up therapy are one component of a multi-disciplinary discharge planning process, led by the attending physician.  Recommendations may be updated based on patient status, additional functional criteria and insurance authorization.   Assistance Recommended at Discharge Frequent or constant Supervision/Assistance  Patient can return home with the following Two people to help with walking and/or transfers;Assistance with cooking/housework;Direct supervision/assist for medications management;Assist for transportation;Help with stairs or ramp for entrance;Direct supervision/assist for financial management;Two people to help with bathing/dressing/bathroom;Assistance with feeding    Functional Status Assessment  Patient has  had a recent decline in their functional status and/or demonstrates limited ability to make significant improvements in function in a reasonable and predictable amount of time  Equipment Recommendations  None recommended by OT       Precautions / Restrictions Precautions Precautions: Fall Restrictions Weight Bearing Restrictions: No RLE Weight Bearing: Weight bearing as tolerated LLE Weight Bearing: Weight bearing as tolerated      Mobility Bed Mobility Overal bed mobility: Needs Assistance Bed Mobility: Supine to Sit, Sit to Supine     Supine to sit: Total assist, +2 for physical assistance, +2 for safety/equipment, HOB elevated Sit to supine: Total assist, +2 for physical assistance, +2 for safety/equipment   General bed mobility comments: Utilized bedpad for positioning at [facility] ("helicopter spin"). Poor sitting balance with pt pushing into extension/unable to sit fully upright/shift weight anteriorly. Eyes closed mostly-pt will briefly open them when cued.        Balance Overall balance assessment: History of Falls, Needs assistance Sitting-balance support: Bilateral upper extremity supported Sitting balance-Leahy Scale: Zero Sitting balance - Comments: Heavy +2 assist to maintain static sitting balance Postural control: Posterior lean         ADL either performed or assessed with clinical judgement   ADL Overall ADL's : Needs assistance/impaired Eating/Feeding: Bed level;Total assistance   Grooming: Bed level;Total assistance   Upper Body Bathing: Bed level;Total assistance   Lower Body Bathing: Bed level;Total assistance   Upper Body Dressing : Bed level;Total assistance   Lower Body Dressing: Bed level;Total assistance     Toilet Transfer Details (indicate cue type and reason): patient was +2 for sitting EOB with strong pushing reponse posteriorly. patients caregiver was present during session. Toileting- Clothing Manipulation and Hygiene: Bed level;Total  assistance               Vision  Additional Comments: patient kept eyes closed during session with increased pain with all movements.            Pertinent Vitals/Pain Pain Assessment Pain Assessment: Faces Faces Pain Scale: Hurts worst Pain Descriptors / Indicators: Grimacing, Moaning (yells out) Pain Intervention(s): Limited activity within patient's tolerance, Repositioned, Premedicated before session     Hand Dominance Right   Extremity/Trunk Assessment Upper Extremity Assessment Upper Extremity Assessment: Generalized weakness (ROM was [facility] when patient opened eyes to follow commands. grip strength was 4/5)   Lower Extremity Assessment Lower Extremity Assessment: Defer to PT evaluation   Cervical / Trunk Assessment Cervical / Trunk Assessment: Kyphotic   Communication Communication Communication: No difficulties   Cognition Arousal/Alertness:  (tends to keep eyes closed) Behavior During Therapy: Flat affect Overall Cognitive Status: Difficult to assess                    Home Living Family/patient expects to be discharged to:: Private residence Living Arrangements: Alone Available Help at Discharge: Personal care attendant;Available 24 hours/day;Friend(s);Family Type of Home: House Home Access: Stairs to enter [employer] of Steps: 3-4 Entrance Stairs-Rails: Left Home Layout: One level     Bathroom Shower/Tub: Sponge bathes at baseline         Home Equipment: Toilet riser;Tub bench;Cane - single point;Rollator (4 wheels);Standard Walker;BSC/3in1;Wheelchair - manual   Additional Comments: info taken from previous admission      Prior Functioning/Environment Prior Level of Function : Needs assist             Mobility Comments: Pt ambulates in house with supervision and use of a walker ADLs Comments: Pt has aide that assists with IADLs (cleaning, occasional assist w/ meals). She can do ADLs w/o assist but aide will help with a  sponge bath when she is there too (1x/wk) sits on commode for sponge bath. She has assist for transportation to stores and appointments. She has meals on wheels M-F for lunch. Daughter brings food for microwave and aide helps with food when there.        OT Problem List: Decreased activity tolerance;Impaired balance (sitting and/or standing);Decreased coordination;Decreased safety awareness;Decreased knowledge of precautions;Pain;Cardiopulmonary status limiting activity;Decreased knowledge of use of DME or AE      OT Treatment/Interventions: Self-care/ADL training    OT Goals(Current goals can be found in the care plan section) Acute Rehab OT Goals Patient Stated Goal: none stated OT Goal Formulation: Patient unable to participate in goal setting Time For Goal Achievement: 05/21/23 Potential to Achieve Goals: Fair  OT Frequency: Min 1X/week    Co-evaluation PT/OT/SLP Co-Evaluation/Treatment: Yes Reason for Co-Treatment: To address functional/ADL transfers PT goals addressed during session: Mobility/safety with mobility OT goals addressed during session: ADL's and self-care      AM-PAC OT "6 Clicks" Daily Activity     Outcome Measure Help from another person eating meals?: Total Help from another person taking care of personal grooming?: Total Help from another person toileting, which includes using toliet, bedpan, or urinal?: Total Help from another person bathing (including washing, rinsing, drying)?: Total Help from another person to put on and taking off regular upper body clothing?: Total Help from another person to put on and taking off regular lower body clothing?: Total 6 Click Score: 6   End of Session Nurse Communication: Other (comment) (nurse in room during session.)  Activity Tolerance: Patient limited by pain Patient left: in bed;with call bell/phone within reach;with bed alarm set;with family/visitor present;with nursing/sitter in room  OT Visit Diagnosis:  Unsteadiness on feet (R26.81);Other abnormalities of gait and mobility (R26.89);Pain;History of falling (Z91.81);Muscle weakness (generalized) (M62.81)                Time: 7829-5621 OT Time Calculation (min): 16 min Charges:  OT General Charges $OT Visit: 1 Visit OT Evaluation $OT Eval Moderate Complexity: 1 Mod  Cullin Dishman OTR/L, MS Acute Rehabilitation Department Office# [phone]   Selinda Flavin 05/07/2023, 12:59 PM

## 2023-05-07 NOTE — Assessment & Plan Note (Addendum)
-   Patient noted to be in RVR on admission likely pain induced from traumatic fractures - TSH normal; slightly elevated FT4 but has been slowly improving - Initially started on Cardizem drip.  Resuming home Cardizem and Lopressor -Unable to wean off of Cardizem drip given refractory RVR despite resuming home medicines; blood pressure borderline and RVR likely in setting of severe pain; for now, continuing home regimen and Cardizem drip until hopefully pain more controlled and RVR more amenable to weaning Cardizem drip -Eliquis on hold in setting of extraperitoneal hemorrhage; initial Hgb 12.3 g/dL and has further downtrended to 9 g/dL this morning; discussed with daughter we'll prob have to keep her off Eliquis at discharge as well - continue trending H/H

## 2023-05-07 NOTE — Assessment & Plan Note (Addendum)
-   Previously had elevated TSH and was felt due to noncompliance due to forgetfulness.  - she has now been having caretakers and living with family - Most recent TSH now 02/21/2023 and is normal range, 2.290 - repeat TSH 1.2 (prior 1.67) and FT4 1.25 (and prior 1.67) - synthroid in Feb 2024 was 125 mcg daily and now is 150 mcg daily; her TSH has downtrended some after this although FT4 came down some too - for now, can continue current dose, but needs repeat TSH and FT4 again soon and if TSH becoming too suppressed may need to lower dose again esp in setting of age and RVR with afib

## 2023-05-07 NOTE — TOC Initial Note (Addendum)
Transition of Care [facility]) - Initial/Assessment Note    Patient Details  Name: [name] MRN: [id number] Date of Birth: Oct 21, 1931  [facility] CM/SW Contact:    Howell Rucks, RN Phone Number: 05/07/2023, 12:32 PM  Clinical Narrative:   Met with pt and caregiver (Janelle) at bedside to introduce role of TOC/NCM and review for dc planning, PT recommendation for short term rehab-SNF. Janelle placed call to pt's dtr Zella Ball), NCM introduced self and role of TOC/NCM. Zella Ball reports pt has 24 hr caregivers, home PT though [facility], reports pt has walker at home, having ramp installed today. Robin declined SNF reports pt has all the resources she needs in place at home. TOC consult for home hospital bed , referral to [employer] rep-Cory . NCM sent order request to attending via teams chat.  [facility] rep-Cory, notified of pt admission with plan for resumption of HH PT at discharge. TOC will continue to follow.      -1:16pm  [facility] rep-Cory, reports pt not currently on service, accepted for [facility] PT.                    Patient Goals and CMS Choice            Expected Discharge Plan and Services                                              Prior Living Arrangements/Services                       Activities of Daily Living Home Assistive Devices/Equipment: Eyeglasses, Gilmer Mor (specify quad or straight), Oxygen, Blood pressure cuff, Bedside commode/3-in-1, Wheelchair, Environmental consultant (specify type) ADL Screening (condition at time of admission) Patient's cognitive ability adequate to safely complete daily activities?: Yes Is the patient deaf or have difficulty hearing?: Yes Does the patient have difficulty seeing, even when wearing glasses/contacts?: No Does the patient have difficulty concentrating, remembering, or making decisions?: Yes (at times) Patient able to express need for assistance with ADLs?: Yes Does the patient have difficulty dressing or bathing?:  Yes Independently performs ADLs?: No Communication: Independent Dressing (OT): Needs assistance Is this a change from baseline?: Pre-admission baseline Grooming: Needs assistance Is this a change from baseline?: Pre-admission baseline Feeding: Independent Bathing: Needs assistance Is this a change from baseline?: Pre-admission baseline Toileting: Needs assistance Is this a change from baseline?: Pre-admission baseline In/Out Bed: Needs assistance Is this a change from baseline?: Pre-admission baseline Walks in Home: Independent with device (comment), Needs assistance Is this a change from baseline?: Pre-admission baseline Does the patient have difficulty walking or climbing stairs?: Yes Weakness of Legs: Both Weakness of Arms/Hands: Both  Permission Sought/Granted                  Emotional Assessment              Admission diagnosis:  Pelvic fracture (HCC) [S32.9XXA] Atrial fibrillation with RVR (HCC) [I48.91] Fall, initial encounter [W19.XXXA] Closed nondisplaced fracture of pelvis, unspecified part of pelvis, initial encounter (HCC) [S32.9XXA] Patient Active Problem List   Diagnosis Date Noted   Pelvic fracture (HCC) 05/06/2023   Malnutrition of moderate degree 01/23/2023   Acute hypoxic respiratory failure (HCC) 01/21/2023   Chronic diastolic CHF (congestive heart failure) (HCC) 01/21/2023   Tachypnea 01/21/2023   Acute on  chronic diastolic heart failure (HCC) 01/13/2023   Chronic diarrhea 01/12/2023   Generalized weakness 01/10/2023   Depression with anxiety 01/10/2023   Pleural effusion on right 01/10/2023   Lesion of left native kidney 01/10/2023   Physical deconditioning 12/13/2022   Hypokalemia 12/13/2022   Diarrhea 12/13/2022   Fall 12/12/2022   Atypical atrial flutter (HCC) 06/13/2021   Secondary hypercoagulable state (HCC) 06/13/2021   A-fib (HCC)    Dilated cardiomyopathy (HCC) 08/27/2018   Acute bronchitis 11/15/2017   S/P Nissen  fundoplication (without gastrostomy tube) procedure 05/25/2017   Persistent atrial fibrillation (HCC)    Chronic cough 01/02/2017   Lumbar stenosis with neurogenic claudication 04/20/2016   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   Malignant neoplasm of breast (HCC) 02/25/2016   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   Atrial fibrillation with RVR (HCC) 03/31/2015   Post-surgical hypothyroidism 04/09/2014   Anticoagulated by anticoagulation treatment - Eliquis 03/30/2014   DMII (diabetes mellitus, type 2) (HCC)    Heart murmur    DJD (degenerative joint disease) of knee    Glaucoma    History of laser assisted in situ keratomileusis 07/25/2012   Degenerative disorder of eye 07/25/2012   Exotropia, intermittent 07/09/2012   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   Error, refractive, myopia 07/09/2012   ANEMIA, IRON DEFICIENCY 10/14/2010   GERD 10/14/2010   THYROID CANCER 10/13/2010   Hyperlipidemia associated with type 2 diabetes mellitus (HCC) 10/13/2010   ANEMIA, UNSPECIFIED 10/13/2010   Anxiety state 10/13/2010   MIGRAINE HEADACHE 10/13/2010   Essential hypertension 10/13/2010   EROSIVE ESOPHAGITIS 10/13/2010   BARRETTS ESOPHAGUS 10/13/2010   Hiatal hernia s/p robotic repair & fundoplication 05/25/2017 10/13/2010   Diverticulosis of colon without hemorrhage 10/13/2010   Irritable bowel syndrome 10/13/2010   OSTEOARTHRITIS 10/13/2010   CARDIAC MURMUR 10/13/2010   COLONIC POLYPS, ADENOMATOUS, HX OF 10/13/2010   PCP:  Joya Martyr, MD Pharmacy:   [id number] [place], [place] - [address] AT [facility] & [address] [place] [address] Phone: [phone] Fax: [phone]  [facility] - [place], [place] - [address] [place] [address] Phone: [phone] Fax: [phone]  [facility] [address] [place] - [address] [place]  [address] Phone: [phone] Fax: [phone]  [facility] [address] [place] [address] Phone: [phone] Fax: [phone]     Social Determinants of Health (SDOH) Social History: SDOH Screenings   Food Insecurity: No Food Insecurity (05/06/2023)  Housing: Low Risk  (05/06/2023)  Transportation Needs: No Transportation Needs (05/06/2023)  Utilities: Not At Risk (05/06/2023)  Tobacco Use: Low Risk  (05/07/2023)   SDOH Interventions:     Readmission Risk Interventions    05/07/2023   12:32 PM 01/15/2023    8:56 AM  Readmission Risk Prevention Plan  Transportation Screening Complete Complete  PCP or Specialist Appt within 3-5 Days  Complete  HRI or Home Care Consult  Complete  Social Work Consult for Recovery Care Planning/Counseling  Complete  Palliative Care Screening  Not Applicable  Medication Review Oceanographer) Complete Complete  PCP or Specialist appointment within 3-5 days of discharge Complete   HRI or Home Care Consult Complete   SW Recovery Care/Counseling Consult Complete   Palliative Care Screening Not Applicable   Skilled Nursing Facility Patient Refused

## 2023-05-07 NOTE — Assessment & Plan Note (Signed)
Continue Cardizem and Lopressor.

## 2023-05-07 NOTE — Assessment & Plan Note (Signed)
-   at risk for being bed bound with progressive decline - family wishing for taking patient home at d/c with ongoing caregivers

## 2023-05-07 NOTE — Assessment & Plan Note (Signed)
-   A1c 7.3%; previously 5.9% about 4 months ago - Start SSI and CBG monitoring

## 2023-05-08 DIAGNOSIS — S32810A Multiple fractures of pelvis with stable disruption of pelvic ring, initial encounter for closed fracture: Secondary | ICD-10-CM | POA: Diagnosis not present

## 2023-05-08 DIAGNOSIS — I4819 Other persistent atrial fibrillation: Secondary | ICD-10-CM | POA: Diagnosis not present

## 2023-05-08 LAB — CBC WITH DIFFERENTIAL/PLATELET
Abs Immature Granulocytes: 0.07 10*3/uL (ref 0.00–0.07)
Basophils Absolute: 0 10*3/uL (ref 0.0–0.1)
Basophils Relative: 0 %
Eosinophils Absolute: 0 10*3/uL (ref 0.0–0.5)
Eosinophils Relative: 0 %
HCT: 28.4 % — ABNORMAL LOW (ref 36.0–46.0)
Hemoglobin: 9 g/dL — ABNORMAL LOW (ref 12.0–15.0)
Immature Granulocytes: 1 %
Lymphocytes Relative: 8 %
Lymphs Abs: 0.8 10*3/uL (ref 0.7–4.0)
MCH: 31.8 pg (ref 26.0–34.0)
MCHC: 31.7 g/dL (ref 30.0–36.0)
MCV: 100.4 fL — ABNORMAL HIGH (ref 80.0–100.0)
Monocytes Absolute: 1.2 10*3/uL — ABNORMAL HIGH (ref 0.1–1.0)
Monocytes Relative: 12 %
Neutro Abs: 7.8 10*3/uL — ABNORMAL HIGH (ref 1.7–7.7)
Neutrophils Relative %: 79 %
Platelets: 127 10*3/uL — ABNORMAL LOW (ref 150–400)
RBC: 2.83 MIL/uL — ABNORMAL LOW (ref 3.87–5.11)
RDW: 13.2 % (ref 11.5–15.5)
WBC: 9.9 10*3/uL (ref 4.0–10.5)
nRBC: 0 % (ref 0.0–0.2)

## 2023-05-08 LAB — T4, FREE: Free T4: 1.25 ng/dL — ABNORMAL HIGH (ref 0.61–1.12)

## 2023-05-08 LAB — BASIC METABOLIC PANEL
Anion gap: 8 (ref 5–15)
BUN: 24 mg/dL — ABNORMAL HIGH (ref 8–23)
CO2: 23 mmol/L (ref 22–32)
Calcium: 8 mg/dL — ABNORMAL LOW (ref 8.9–10.3)
Chloride: 104 mmol/L (ref 98–111)
Creatinine, Ser: 0.51 mg/dL (ref 0.44–1.00)
GFR, Estimated: >60 mL/min (ref >60)
Glucose, Bld: 188 mg/dL — ABNORMAL HIGH (ref 70–99)
Potassium: 3.3 mmol/L — ABNORMAL LOW (ref 3.5–5.1)
Sodium: 135 mmol/L (ref 135–145)

## 2023-05-08 LAB — TSH: TSH: 1.2 u[IU]/mL (ref 0.350–4.500)

## 2023-05-08 LAB — GLUCOSE, CAPILLARY
Glucose-Capillary: 168 mg/dL — ABNORMAL HIGH (ref 70–99)
Glucose-Capillary: 142 mg/dL — ABNORMAL HIGH (ref 70–99)
Glucose-Capillary: 91 mg/dL (ref 70–99)
Glucose-Capillary: 161 mg/dL — ABNORMAL HIGH (ref 70–99)

## 2023-05-08 LAB — MAGNESIUM: Magnesium: 1.5 mg/dL — ABNORMAL LOW (ref 1.7–2.4)

## 2023-05-08 MED ORDER — ACETAMINOPHEN 325 MG PO TABS
650.0000 mg | ORAL_TABLET | Freq: Four times a day (QID) | ORAL | Status: DC
  Administered 2023-05-08 – 2023-05-11 (×9): 650 mg via ORAL
  Filled 2023-05-08 (×12): qty 2

## 2023-05-08 MED ORDER — MAGNESIUM SULFATE 2 GM/50ML IV SOLN
2.0000 g | Freq: Once | INTRAVENOUS | Status: AC
  Administered 2023-05-08: 2 g via INTRAVENOUS
  Filled 2023-05-08: qty 50

## 2023-05-08 MED ORDER — METHOCARBAMOL 500 MG PO TABS
500.0000 mg | ORAL_TABLET | Freq: Three times a day (TID) | ORAL | Status: DC
  Administered 2023-05-08 – 2023-05-11 (×9): 500 mg via ORAL
  Filled 2023-05-08 (×9): qty 1

## 2023-05-08 MED ORDER — SODIUM CHLORIDE 0.9 % IV SOLN: INTRAVENOUS | Status: DC

## 2023-05-08 MED ORDER — SENNOSIDES-DOCUSATE SODIUM 8.6-50 MG PO TABS: 1.0000 | ORAL_TABLET | Freq: Two times a day (BID) | ORAL | Status: DC

## 2023-05-08 MED ORDER — POLYETHYLENE GLYCOL 3350 17 G PO PACK
17.0000 g | PACK | Freq: Every day | ORAL | Status: DC
  Administered 2023-05-10 – 2023-05-11 (×2): 17 g via ORAL
  Filled 2023-05-08 (×3): qty 1

## 2023-05-08 NOTE — Progress Notes (Signed)
Progress Note    [name]   QMV:[id number]  DOB: May 12, 1931  DOA: 04/16/2023     2 PCP: Joya Martyr, MD  Initial CC: fall at home  Hospital Course: [name] is a 89 year old female with PMH A-fib on Eliquis, DM II, HTN, HLD, hypothyroidism who presented to the hospital after a witnessed and mechanical fall on 05/10/2023.  Patient was attempting to stand up and had lost footing falling to the ground. She underwent multiple imaging studies on workup in the ER.  She was also noted to be in A-fib with RVR and was initiated on a Cardizem drip. Imaging studies showed a bilateral comminuted mildly displaced fractures of the inferior and superior pubic rami extending into the pubic body bilaterally.  There was also noted to be adjacent extraperitoneal blood products in the pelvis. Eliquis was held on admission.  Orthopedic surgery was also consulted and recommended nonoperative management with supportive care and weightbearing as tolerated.  Interval History:  No events overnight.  Caretaker present bedside this morning.  Her pain was seemingly a little better today but still quite a bit of pain with any movement of her lower extremities.  She can be heard moaning from in the hallway. Needs to remain in the hospital until pain better controlled/improved. Also remains on Cardizem drip due to refractory RVR still.  Assessment and Plan: * Pelvic fracture (HCC) - S/p mechanical fall at home.  Imaging studies show "Bilateral comminuted mildly displaced fractures of the inferior and superior pubic rami extending into the pubic body bilaterally. Adjacent extraperitoneal blood products in the pelvis" -Weightbearing as tolerated per orthopedic surgery.  No operative management necessary -PT/OT evals.  Family still wishes to bring patient back home with ongoing caretakers -Pain control is main issue; she also cannot get out of bed yet which also may take possibly even several days to  weeks -Start on scheduled Tylenol and scheduled Robaxin -Continue PRN morphine and oxycodone; continue laxative regimen -Outpatient follow-up with Dr. Linna Caprice in 2 to 3 weeks for repeat imaging  Persistent atrial fibrillation (HCC) - Patient noted to be in RVR on admission likely pain induced from traumatic fractures - TSH normal; slightly elevated FT4 but has been slowly improving - Initially started on Cardizem drip.  Resuming home Cardizem and Lopressor -Unable to wean off of Cardizem drip given refractory RVR despite resuming home medicines; blood pressure borderline and RVR likely in setting of severe pain; for now, continuing home regimen and Cardizem drip until hopefully pain more controlled and RVR more amenable to weaning Cardizem drip -Eliquis on hold in setting of extraperitoneal hemorrhage; initial Hgb 12.3 g/dL and has further downtrended to 9 g/dL this morning; discussed with daughter we'll prob have to keep her off Eliquis at discharge as well - continue trending H/H  DMII (diabetes mellitus, type 2) (HCC) - A1c 7.3%; previously 5.9% about 4 months ago - Start SSI and CBG monitoring  Post-surgical hypothyroidism - Previously had elevated TSH and was felt due to noncompliance due to forgetfulness.  - she has now been having caretakers and living with family - Most recent TSH now 02/21/2023 and is normal range, 2.290 - repeat TSH 1.2 (prior 1.67) and FT4 1.25 (and prior 1.67) - synthroid in Feb 2024 was 125 mcg daily and now is 150 mcg daily; her TSH has downtrended some after this although FT4 came down some too - for now, can continue current dose, but needs repeat TSH and FT4 again soon and if  TSH becoming too suppressed may need to lower dose again esp in setting of age and RVR with afib   Depression with anxiety - Continue Zoloft  Physical deconditioning - at risk for being bed bound with progressive decline - family wishing for taking patient home at d/c with ongoing  caregivers   GERD - Continue Protonix  Essential hypertension - Continue Cardizem and Lopressor   Old records reviewed in assessment of this patient  Antimicrobials:   DVT prophylaxis:  SCDs Start: 05/06/23 0735   Code Status:   Code Status: Full Code  Mobility Assessment (last 72 hours)     Mobility Assessment     Row Name 05/08/23 1149 05/07/23 2000 05/07/23 1209 05/07/23 0952 05/06/23 2145   Does patient have an order for bedrest or is patient medically unstable Yes- Bedfast (Level 1) - Complete Yes- Bedfast (Level 1) - Complete -- -- Yes- Bedfast (Level 1) - Complete   What is the highest level of mobility based on the progressive mobility assessment? Level 1 (Bedfast) - Unable to balance while sitting on edge of bed Level 1 (Bedfast) - Unable to balance while sitting on edge of bed Level 1 (Bedfast) - Unable to balance while sitting on edge of bed Level 1 (Bedfast) - Unable to balance while sitting on edge of bed --   Is the above level different from baseline mobility prior to current illness? -- Yes - Recommend PT order -- -- --    Row Name 05/06/23 0902           Does patient have an order for bedrest or is patient medically unstable Yes- Bedfast (Level 1) - Complete                Barriers to discharge: none Disposition Plan:  Home with HH and caretakers Status is: Inpt  Objective: Blood pressure 113/73, pulse 79, temperature 97.7 F (36.5 C), temperature source Oral, resp. rate (!) 24, height 5\' 6"  (1.676 m), weight 60.9 kg, SpO2 99 %.  Examination:  Physical Exam Constitutional:      Comments: Pleasant elderly woman lying in bed in no distress but appears uncomfortable with lower extremity movements  HENT:     Head: Normocephalic and atraumatic.     Mouth/Throat:     Mouth: Mucous membranes are moist.  Eyes:     Extraocular Movements: Extraocular movements intact.  Cardiovascular:     Rate and Rhythm: Tachycardia present. Rhythm irregular.   Pulmonary:     Effort: Pulmonary effort is normal. No respiratory distress.     Breath sounds: Normal breath sounds. No wheezing.  Abdominal:     General: Bowel sounds are normal. There is no distension.     Palpations: Abdomen is soft.     Tenderness: There is no abdominal tenderness.  Musculoskeletal:     Cervical back: Normal range of motion and neck supple.     Comments: Exquisite pain with LE passive ROM bilaterally  Skin:    General: Skin is warm and dry.  Neurological:     Comments: Follows commands, moves all 4 extremities      Consultants:  Orthopedic surgery  Procedures:    Data Reviewed: Results for orders placed or performed during the hospital encounter of May 13, 2023 (from the past 24 hour(s))  Glucose, capillary     Status: Abnormal   Collection Time: 05/07/23  4:50 PM  Result Value Ref Range   Glucose-Capillary 181 (H) 70 - 99 mg/dL  Glucose, capillary  Status: Abnormal   Collection Time: 05/07/23  9:24 PM  Result Value Ref Range   Glucose-Capillary 167 (H) 70 - 99 mg/dL  T4, free     Status: Abnormal   Collection Time: 05/08/23  4:45 AM  Result Value Ref Range   Free T4 1.25 (H) 0.61 - 1.12 ng/dL  TSH     Status: None   Collection Time: 05/08/23  4:45 AM  Result Value Ref Range   TSH 1.200 0.350 - 4.500 uIU/mL  Basic metabolic panel     Status: Abnormal   Collection Time: 05/08/23  4:45 AM  Result Value Ref Range   Sodium 135 135 - 145 mmol/L   Potassium 3.3 (L) 3.5 - 5.1 mmol/L   Chloride 104 98 - 111 mmol/L   CO2 23 22 - 32 mmol/L   Glucose, Bld 188 (H) 70 - 99 mg/dL   BUN 24 (H) 8 - 23 mg/dL   Creatinine, Ser 4.09 0.44 - 1.00 mg/dL   Calcium 8.0 (L) 8.9 - 10.3 mg/dL   GFR, Estimated >81 >19 mL/min   Anion gap 8 5 - 15  CBC with Differential/Platelet     Status: Abnormal   Collection Time: 05/08/23  4:45 AM  Result Value Ref Range   WBC 9.9 4.0 - 10.5 K/uL   RBC 2.83 (L) 3.87 - 5.11 MIL/uL   Hemoglobin 9.0 (L) 12.0 - 15.0 g/dL   HCT  14.7 (L) 82.9 - 46.0 %   MCV 100.4 (H) 80.0 - 100.0 fL   MCH 31.8 26.0 - 34.0 pg   MCHC 31.7 30.0 - 36.0 g/dL   RDW 56.2 13.0 - 86.5 %   Platelets 127 (L) 150 - 400 K/uL   nRBC 0.0 0.0 - 0.2 %   Neutrophils Relative % 79 %   Neutro Abs 7.8 (H) 1.7 - 7.7 K/uL   Lymphocytes Relative 8 %   Lymphs Abs 0.8 0.7 - 4.0 K/uL   Monocytes Relative 12 %   Monocytes Absolute 1.2 (H) 0.1 - 1.0 K/uL   Eosinophils Relative 0 %   Eosinophils Absolute 0.0 0.0 - 0.5 K/uL   Basophils Relative 0 %   Basophils Absolute 0.0 0.0 - 0.1 K/uL   Immature Granulocytes 1 %   Abs Immature Granulocytes 0.07 0.00 - 0.07 K/uL  Magnesium     Status: Abnormal   Collection Time: 05/08/23  4:45 AM  Result Value Ref Range   Magnesium 1.5 (L) 1.7 - 2.4 mg/dL  Glucose, capillary     Status: Abnormal   Collection Time: 05/08/23  7:42 AM  Result Value Ref Range   Glucose-Capillary 168 (H) 70 - 99 mg/dL    I have reviewed pertinent nursing notes, vitals, labs, and images as necessary. I have ordered labwork to follow up on as indicated.  I have reviewed the last notes from staff over past 24 hours. I have discussed patient's care plan and test results with nursing staff, CM/SW, and other staff as appropriate.  Time spent: Greater than 50% of the 55 minute visit was spent in counseling/coordination of care for the patient as laid out in the A&P.   LOS: 2 days   Lewie Chamber, MD [facility] 05/08/2023, 3:00 PM

## 2023-05-08 NOTE — TOC Progression Note (Signed)
Transition of Care [facility]) - Progression Note    Patient Details  Name: [name] MRN: [id number] Date of Birth: [birth year]  [facility] of [facility]) CM/SW Contact  Lavenia Atlas, RN Phone Number: 05/08/2023, 5:51 PM  Clinical Narrative:   This RNCM received secure chat from RN re: patient's daughter requesting a call from [facility].  This RNCM spoke with patient's daughter Zella Ball who reports she has received multiple calls from [employer] regarding hospital bed $11 per month copay. However when pt's daughter returned the call it is a Industrial/product designer phone number for [employer] and that department reports they can not assist her. This RNCM spoke with Joni Reining with [employer] after hours department who reports patient's/ pt's daughter will need to call [employer] at [phone] between 8am-5pm. Notified patient's daughter RN and Zella Ball who request a text with [employer]'s phone#.Marland Kitchen  TOC will continue to follow.            Expected Discharge Plan and Services                                               Social Determinants of Health (SDOH) Interventions SDOH Screenings   Food Insecurity: No Food Insecurity (05/06/2023)  Housing: Low Risk  (05/06/2023)  Transportation Needs: No Transportation Needs (05/06/2023)  Utilities: Not At Risk (05/06/2023)  Tobacco Use: Low Risk  (May 25, 2023)    Readmission Risk Interventions    05/07/2023   12:32 PM 01/15/2023    8:56 AM  Readmission Risk Prevention Plan  Transportation Screening Complete Complete  PCP or Specialist Appt within 3-5 Days  Complete  HRI or Home Care Consult  Complete  Social Work Consult for Recovery Care Planning/Counseling  Complete  Palliative Care Screening  Not Applicable  Medication Review Oceanographer) Complete Complete  PCP or Specialist appointment within 3-5 days of discharge Complete   HRI or Home Care Consult Complete   SW Recovery Care/Counseling Consult Complete   Palliative Care Screening Not Applicable   Skilled Nursing  Facility Patient Refused

## 2023-05-08 NOTE — Plan of Care (Signed)
  Problem: Nutrition: Goal: Adequate nutrition will be maintained Outcome: Progressing   Problem: Coping: Goal: Level of anxiety will decrease Outcome: Progressing   

## 2023-05-08 NOTE — Plan of Care (Signed)
?  Problem: Education: ?Goal: Knowledge of General Education information will improve ?Description: Including pain rating scale, medication(s)/side effects and non-pharmacologic comfort measures ?Outcome: Not Progressing ?  ?Problem: Health Behavior/Discharge Planning: ?Goal: Ability to manage health-related needs will improve ?Outcome: Not Progressing ?  ?Problem: Clinical Measurements: ?Goal: Ability to maintain clinical measurements within normal limits will improve ?Outcome: Not Progressing ?  ?Problem: Activity: ?Goal: Risk for activity intolerance will decrease ?Outcome: Not Progressing ?  ?Problem: Nutrition: ?Goal: Adequate nutrition will be maintained ?Outcome: Not Progressing ?  ?

## 2023-05-09 DIAGNOSIS — W19XXXA Unspecified fall, initial encounter: Secondary | ICD-10-CM | POA: Diagnosis not present

## 2023-05-09 DIAGNOSIS — I4891 Unspecified atrial fibrillation: Secondary | ICD-10-CM

## 2023-05-09 DIAGNOSIS — S329XXA Fracture of unspecified parts of lumbosacral spine and pelvis, initial encounter for closed fracture: Secondary | ICD-10-CM | POA: Diagnosis not present

## 2023-05-09 LAB — GLUCOSE, CAPILLARY
Glucose-Capillary: 153 mg/dL — ABNORMAL HIGH (ref 70–99)
Glucose-Capillary: 189 mg/dL — ABNORMAL HIGH (ref 70–99)
Glucose-Capillary: 171 mg/dL — ABNORMAL HIGH (ref 70–99)

## 2023-05-09 LAB — CBC WITH DIFFERENTIAL/PLATELET
Abs Immature Granulocytes: 0.06 10*3/uL (ref 0.00–0.07)
Basophils Absolute: 0 10*3/uL (ref 0.0–0.1)
Basophils Relative: 0 %
Eosinophils Absolute: 0.1 10*3/uL (ref 0.0–0.5)
Eosinophils Relative: 1 %
HCT: 32.1 % — ABNORMAL LOW (ref 36.0–46.0)
Hemoglobin: 10.4 g/dL — ABNORMAL LOW (ref 12.0–15.0)
Immature Granulocytes: 1 %
Lymphocytes Relative: 7 %
Lymphs Abs: 0.7 10*3/uL (ref 0.7–4.0)
MCH: 31.7 pg (ref 26.0–34.0)
MCHC: 32.4 g/dL (ref 30.0–36.0)
MCV: 97.9 fL (ref 80.0–100.0)
Monocytes Absolute: 1 10*3/uL (ref 0.1–1.0)
Monocytes Relative: 11 %
Neutro Abs: 7.8 10*3/uL — ABNORMAL HIGH (ref 1.7–7.7)
Neutrophils Relative %: 80 %
Platelets: 154 10*3/uL (ref 150–400)
RBC: 3.28 MIL/uL — ABNORMAL LOW (ref 3.87–5.11)
RDW: 13.1 % (ref 11.5–15.5)
WBC: 9.7 10*3/uL (ref 4.0–10.5)
nRBC: 0 % (ref 0.0–0.2)

## 2023-05-09 LAB — BASIC METABOLIC PANEL
Anion gap: 9 (ref 5–15)
BUN: 21 mg/dL (ref 8–23)
CO2: 22 mmol/L (ref 22–32)
Calcium: 8.4 mg/dL — ABNORMAL LOW (ref 8.9–10.3)
Chloride: 101 mmol/L (ref 98–111)
Creatinine, Ser: 0.6 mg/dL (ref 0.44–1.00)
GFR, Estimated: >60 mL/min (ref >60)
Glucose, Bld: 172 mg/dL — ABNORMAL HIGH (ref 70–99)
Potassium: 3.6 mmol/L (ref 3.5–5.1)
Sodium: 132 mmol/L — ABNORMAL LOW (ref 135–145)

## 2023-05-09 LAB — MAGNESIUM: Magnesium: 1.9 mg/dL (ref 1.7–2.4)

## 2023-05-09 MED ORDER — DILTIAZEM LOAD VIA INFUSION: 10.0000 mg | Freq: Once | INTRAVENOUS | Status: DC

## 2023-05-09 MED ORDER — OXYCODONE HCL 5 MG PO TABS
5.0000 mg | ORAL_TABLET | ORAL | Status: DC | PRN
  Administered 2023-05-09: 5 mg via ORAL
  Filled 2023-05-09: qty 1

## 2023-05-09 MED ORDER — DILTIAZEM HCL 60 MG PO TABS
120.0000 mg | ORAL_TABLET | Freq: Once | ORAL | Status: DC
  Filled 2023-05-09: qty 4

## 2023-05-09 MED ORDER — APIXABAN 2.5 MG PO TABS
2.5000 mg | ORAL_TABLET | Freq: Two times a day (BID) | ORAL | Status: DC
  Administered 2023-05-09 – 2023-05-13 (×9): 2.5 mg via ORAL
  Filled 2023-05-09 (×9): qty 1

## 2023-05-09 MED ORDER — HYDROMORPHONE HCL 1 MG/ML IJ SOLN
0.5000 mg | Freq: Two times a day (BID) | INTRAMUSCULAR | Status: DC | PRN
  Administered 2023-05-09 – 2023-05-11 (×2): 0.5 mg via INTRAVENOUS
  Filled 2023-05-09 (×3): qty 0.5

## 2023-05-09 MED ORDER — DILTIAZEM HCL 25 MG/5ML IV SOLN
10.0000 mg | Freq: Once | INTRAVENOUS | Status: AC
  Administered 2023-05-09: 10 mg via INTRAVENOUS
  Filled 2023-05-09: qty 5

## 2023-05-09 MED ORDER — OXYCODONE-ACETAMINOPHEN 5-325 MG PO TABS
1.0000 | ORAL_TABLET | ORAL | Status: DC | PRN
  Filled 2023-05-09: qty 1

## 2023-05-09 MED ORDER — DILTIAZEM HCL ER COATED BEADS 240 MG PO CP24
240.0000 mg | ORAL_CAPSULE | Freq: Every day | ORAL | Status: DC
  Administered 2023-05-10: 240 mg via ORAL
  Filled 2023-05-09: qty 1

## 2023-05-09 NOTE — Progress Notes (Signed)
Physical Therapy Treatment Patient Details Name: [name] MRN: [id number] DOB: 1931/10/08 Today's Date: 05/09/2023   History of Present Illness Patient is a 89 year old female who was admitted with bil superior/inferior pubic rami fractures after a fall at home. Hx of DM, lumbar stenosis, Afib, breast cancer, cervical cancer, OSA, glaucoma    PT Comments    Pt agreeable to working with therapy. Continues to require Total A +2 for mobility. Pt sat EOB for 5 minutes. She was able to sit unsupported for ~45 seconds-1 minute with time and cues. Pt continues to have significant pain with mobility but her activity tolerance is slowly improving.     Recommendations for follow up therapy are one component of a multi-disciplinary discharge planning process, led by the attending physician.  Recommendations may be updated based on patient status, additional functional criteria and insurance authorization.  Follow Up Recommendations  Can patient physically be transported by private vehicle: No    Assistance Recommended at Discharge Frequent or constant Supervision/Assistance  Patient can return home with the following Assistance with cooking/housework;Assist for transportation;Help with stairs or ramp for entrance;Two people to help with walking and/or transfers;Two people to help with bathing/dressing/bathroom   Equipment Recommendations  None recommended by PT    Recommendations for Other Services OT consult     Precautions / Restrictions Precautions Precautions: Fall Restrictions Weight Bearing Restrictions: No Other Position/Activity Restrictions: WBAT     Mobility  Bed Mobility Overal bed mobility: Needs Assistance Bed Mobility: Supine to Sit, Sit to Supine     Supine to sit: Total assist, +2 for physical assistance, +2 for safety/equipment, HOB elevated Sit to supine: Total assist, +2 for physical assistance, +2 for safety/equipment, HOB elevated   General bed mobility  comments: Utilized bedpad for positioning at [facility] ("helicopter spin"). Poor sitting balance with pt pushing into extension/unable to sit fully upright/shift weight anteriorly. Eyes closed mostly-pt will briefly open them when cued. With time, pt able to sit unsupported for ~45 seconds to 1 minute. Fatigues quickly. Remains limited by pain.    Transfers                   General transfer comment: NT-not yet able 2* pain    Ambulation/Gait                   Stairs             Wheelchair Mobility    Modified Rankin (Stroke Patients Only)       Balance Overall balance assessment: History of Falls, Needs assistance         Standing balance support: Reliant on assistive device for balance, During functional activity, Bilateral upper extremity supported Standing balance-Leahy Scale: Poor                              Cognition Arousal/Alertness: Awake/alert (tends to keep eyes closed) Behavior During Therapy: Flat affect Overall Cognitive Status: Difficult to assess                                          Exercises      General Comments        Pertinent Vitals/Pain Pain Assessment Pain Assessment: Faces Faces Pain Scale: Hurts worst Pain Location: pelvis Pain Descriptors / Indicators: Grimacing, Moaning Pain Intervention(s): Limited activity  within patient's tolerance, Monitored during session, Repositioned, Premedicated before session    Home Living                          Prior Function            PT Goals (current goals can now be found in the care plan section) Progress towards PT goals: Progressing toward goals    Frequency    Min 1X/week      PT Plan Current plan remains appropriate    Co-evaluation              AM-PAC PT "6 Clicks" Mobility   Outcome Measure  Help needed turning from your back to your side while in a flat bed without using bedrails?: Total Help needed  moving from lying on your back to sitting on the side of a flat bed without using bedrails?: Total Help needed moving to and from a bed to a chair (including a wheelchair)?: Total Help needed standing up from a chair using your arms (e.g., wheelchair or bedside chair)?: Total Help needed to walk in hospital room?: Total Help needed climbing 3-5 steps with a railing? : Total 6 Click Score: 6    End of Session   Activity Tolerance: Patient limited by pain;Patient limited by fatigue Patient left: in bed;with call bell/phone within reach;with bed alarm set;with family/visitor present   PT Visit Diagnosis: Pain;Other abnormalities of gait and mobility (R26.89);Muscle weakness (generalized) (M62.81) Pain - part of body:  (pelvis)     Time: 1324-4010 PT Time Calculation (min) (ACUTE ONLY): 18 min  Charges:  $Therapeutic Activity: 8-22 mins                        Faye Ramsay, PT Acute Rehabilitation  Office: [phone]

## 2023-05-09 NOTE — Evaluation (Signed)
SLP Cancellation Note  Patient Details Name: CITLALLY CAPTAIN MRN: [id number] DOB: 1931-11-06   Cancelled treatment:       Reason Eval/Treat Not Completed: Other (comment);Patient declined, no reason specified (Pt declined to consume any po, stating she is dyspenic; RR 25-34 at rest; requested to come back to see pt - but she stated "I don't think so, I"m going to die"; will continue efforts.  Advised RN to findings.)  Rolena Infante, MS [facility] SLP Acute Rehab Services Office [phone]   Chales Abrahams 05/09/2023, 2:22 PM

## 2023-05-09 NOTE — Progress Notes (Signed)
Diagnosis code: S32.9XXA  Height: 5' 6''  Weight 133#   Patient has bilateral pelvic fractures which requires bilateral lower extremities to be positioned in ways not feasible with a normal bed.  Patient has limited mobility and cannot independently make changes in body position significant enough to alleviate pressure. Patient is incontinent of urine with external catheter in place.

## 2023-05-09 NOTE — Evaluation (Signed)
SLP Cancellation Note  Patient Details Name: [name] MRN: [id number] DOB: 1931-01-03   Cancelled treatment:       Reason Eval/Treat Not Completed: Other (comment) (RN reports pt declined to take medications or po again; will continue efforts)  Rolena Infante, MS [facility] SLP Acute Rehab Services Office [phone]  Chales Abrahams 05/09/2023, 3:58 PM

## 2023-05-09 NOTE — TOC Progression Note (Addendum)
Transition of Care [facility]) - Progression Note    Patient Details  Name: [name] MRN: [id number] Date of Birth: 09/29/1931  [facility] CM/SW Contact  Howell Rucks, RN Phone Number: 05/09/2023, 9:37 AM  Clinical Narrative:  Evening CM contacted by floor nurse that pt's dtr requesting to speak with CM regarding her inability to contact [employer] for delivery of hospital bed.  NCM outreached to Zach at [employer], confirmed recent contact with pt on 05/07/23, confirmed  [phone]  is correct contact number for pt to contact [employer]. Ian Malkin states [employer] will outreach pt's dtr today as well. TOC will continue to follow.   -10:46am Call received from [facility] with [employer], confirmed hospital bed is scheduled for delivery Friday 05/11/23. Team/attending notified.   -11:10am Teams chat received from floor nurse, pt's dtr Zella Ball) requesting air/gel overlay mattress for pt. NCM sent teams chat to attending, air overlay mattress order entered. NCM notified  [employer] regarding air overlay mattress order.          Expected Discharge Plan and Services                                               Social Determinants of Health (SDOH) Interventions SDOH Screenings   Food Insecurity: No Food Insecurity (05/06/2023)  Housing: Low Risk  (05/06/2023)  Transportation Needs: No Transportation Needs (05/06/2023)  Utilities: Not At Risk (05/06/2023)  Tobacco Use: Low Risk  (Jun 04, 2023)    Readmission Risk Interventions    05/07/2023   12:32 PM 01/15/2023    8:56 AM  Readmission Risk Prevention Plan  Transportation Screening Complete Complete  PCP or Specialist Appt within 3-5 Days  Complete  HRI or Home Care Consult  Complete  Social Work Consult for Recovery Care Planning/Counseling  Complete  Palliative Care Screening  Not Applicable  Medication Review Oceanographer) Complete Complete  PCP or Specialist appointment within 3-5 days of discharge Complete    HRI or Home Care Consult Complete   SW Recovery Care/Counseling Consult Complete   Palliative Care Screening Not Applicable   Skilled Nursing Facility Patient Refused

## 2023-05-09 NOTE — Plan of Care (Signed)
  Problem: Clinical Measurements: Goal: Ability to maintain clinical measurements within normal limits will improve Outcome: Progressing   Problem: Education: Goal: Knowledge of General Education information will improve Description: Including pain rating scale, medication(s)/side effects and non-pharmacologic comfort measures Outcome: Not Progressing   Problem: Health Behavior/Discharge Planning: Goal: Ability to manage health-related needs will improve Outcome: Not Progressing   Problem: Activity: Goal: Risk for activity intolerance will decrease Outcome: Not Progressing   Problem: Nutrition: Goal: Adequate nutrition will be maintained Outcome: Not Progressing   Problem: Elimination: Goal: Will not experience complications related to bowel motility Outcome: Not Progressing

## 2023-05-09 NOTE — Care Management Important Message (Signed)
Important Message  Patient Details IM Letter given. Name: FRANCIA VERRY MRN: [id number] Date of Birth: [birth year]   Medicare Important Message Given:  Yes     Caren Macadam 05/09/2023, 9:32 AM

## 2023-05-09 NOTE — Progress Notes (Addendum)
Progress Note [name]   ONG:[id number]  DOB: 1931/02/25  DOA: 05/07/2023     3 PCP: Joya Martyr, MD  Initial CC: fall at home  Hospital Course: [name] is a 89 year old female with PMH A-fib on Eliquis, DM II, HTN, HLD, hypothyroidism who presented to the hospital after a witnessed and mechanical fall on 04/25/2023.  Patient was attempting to stand up and had lost footing falling to the ground. She underwent multiple imaging studies on workup in the ER.  She was also noted to be in A-fib with RVR and was initiated on a Cardizem drip. Imaging studies showed a bilateral comminuted mildly displaced fractures of the inferior and superior pubic rami extending into the pubic body bilaterally.  There was also noted to be adjacent extraperitoneal blood products in the pelvis. Eliquis was held on admission.  Orthopedic surgery was also consulted and recommended nonoperative management with supportive care and weightbearing as tolerated.  Patient is being treated with pain management for her pelvic fracture and denies pain currently while at rest. PT/OT evaluated and recommended HH vs SNF.   Afib RVR was well controlled on cardizem gtt overnight so being transitioned off today after her home medications were resumed. She is asymptomatic. Eliquis is being restarted.   Subjective: Patient states that she feels well at rest and is pain-free while laying still. Endorses pain when she tries to move her hips or stand. Denies chest pain, SOB, or flutter/palpitations.  Caregiver at bedside describes the patient "squirelling" food in her cheeks which is new and thinks its related to sedating pain medications. Will order SLP evaluation.  Assessment and Plan: Pelvic fracture- S/p mechanical fall at home.  Imaging studies show "Bilateral comminuted mildly displaced fractures of the inferior and superior pubic rami extending into the pubic body bilaterally. Adjacent extraperitoneal blood products  in the pelvis" -Weightbearing as tolerated per orthopedic surgery.  No operative management necessary -PT/OT evals.  Family still wishes to bring patient back home with ongoing caretakers -Pain control is main issue; she also cannot get out of bed yet which also may take possibly even several days to weeks -Start on scheduled Tylenol and scheduled Robaxin -Continue PRN morphine and oxycodone; continue laxative regimen -Outpatient follow-up with Dr. Linna Caprice in 2 to 3 weeks for repeat imaging  Persistent atrial fibrillation (HCC)- Patient noted to be in RVR on admission likely pain induced from traumatic fractures. TSH normal; slightly elevated FT4 but has been slowly improving - Initially started on Cardizem drip.  Resuming home Cardizem and Lopressor - attempting to wean off cardizem gtt today.  - home cardizem PO and metoprolol are continued.  - hgb stable, improved so will restart eliquis today and close monitoring for bleeding - continue trending H/H - continue telemetry  DMII (diabetes mellitus, type 2) (HCC) - A1c 7.3%; previously 5.9% about 4 months ago - Start SSI and CBG monitoring  Post-surgical hypothyroidism - Previously had elevated TSH and was felt due to noncompliance due to forgetfulness.  - she has now been having caretakers and living with family - Most recent TSH now 02/21/2023 and is normal range, 2.290 - repeat TSH 1.2 (prior 1.67) and FT4 1.25 (and prior 1.67) - synthroid in Feb 2024 was 125 mcg daily and now is 150 mcg daily; her TSH has downtrended some after this although FT4 came down some too - for now, can continue current dose, but needs repeat TSH and FT4 again soon and if TSH becoming too suppressed may  need to lower dose again esp in setting of age and RVR with afib   Depression with anxiety - Continue Zoloft  Physical deconditioning - at risk for being bed bound with progressive decline - family wishing for taking patient home at d/c with ongoing  caregivers  - PT/OT/SLP  GERD - Continue Protonix  Essential hypertension - Continue Cardizem and Lopressor  Old records reviewed in assessment of this patient  DVT prophylaxis:  SCDs Start: 05/06/23 0735   Code Status:   Code Status: Full Code  Mobility Assessment (last 72 hours)     Mobility Assessment     Row Name 05/08/23 2200 05/08/23 1149 05/07/23 2000 05/07/23 1209 05/07/23 0952   Does patient have an order for bedrest or is patient medically unstable Yes- Bedfast (Level 1) - Complete Yes- Bedfast (Level 1) - Complete Yes- Bedfast (Level 1) - Complete -- --   What is the highest level of mobility based on the progressive mobility assessment? Level 1 (Bedfast) - Unable to balance while sitting on edge of bed Level 1 (Bedfast) - Unable to balance while sitting on edge of bed Level 1 (Bedfast) - Unable to balance while sitting on edge of bed Level 1 (Bedfast) - Unable to balance while sitting on edge of bed Level 1 (Bedfast) - Unable to balance while sitting on edge of bed   Is the above level different from baseline mobility prior to current illness? Yes - Recommend PT order -- Yes - Recommend PT order -- --    Row Name 05/06/23 2145 05/06/23 0902         Does patient have an order for bedrest or is patient medically unstable Yes- Bedfast (Level 1) - Complete Yes- Bedfast (Level 1) - Complete              Barriers to discharge: none Disposition Plan:  Home with HH and caretakers Status is: Inpt  Objective: Blood pressure 116/77, pulse 89, temperature 98.1 F (36.7 C), temperature source Axillary, resp. rate (!) 23, height 5\' 6"  (1.676 m), weight 60.9 kg, SpO2 95 %.  Examination:  Physical Exam Constitutional:      Comments: Pleasant elderly woman lying in bed in no distress but appears uncomfortable with lower extremity movements  HENT:     Head: Normocephalic and atraumatic.     Mouth/Throat:     Mouth: Mucous membranes are moist.  Eyes:     Extraocular  Movements: Extraocular movements intact.  Cardiovascular:     Rate and Rhythm: Tachycardia present. Rhythm irregular.  Pulmonary:     Effort: Pulmonary effort is normal. No respiratory distress.     Breath sounds: Normal breath sounds. No wheezing.  Abdominal:     General: Bowel sounds are normal. There is no distension.     Palpations: Abdomen is soft.     Tenderness: There is no abdominal tenderness.  Musculoskeletal:     Cervical back: Normal range of motion and neck supple.     Comments: Exquisite pain with LE passive ROM bilaterally  Skin:    General: Skin is warm and dry.  Neurological:     Comments: Follows commands, moves all 4 extremities     Consultants:  Orthopedic surgery  Procedures:  None   Data Reviewed: Results for orders placed or performed during the hospital encounter of May 29, 2023 (from the past 24 hour(s))  Glucose, capillary     Status: Abnormal   Collection Time: 05/08/23  7:42 AM  Result Value Ref Range  Glucose-Capillary 168 (H) 70 - 99 mg/dL  Glucose, capillary     Status: Abnormal   Collection Time: 05/08/23 12:53 PM  Result Value Ref Range   Glucose-Capillary 142 (H) 70 - 99 mg/dL  Glucose, capillary     Status: None   Collection Time: 05/08/23  4:34 PM  Result Value Ref Range   Glucose-Capillary 91 70 - 99 mg/dL  Glucose, capillary     Status: Abnormal   Collection Time: 05/08/23  8:24 PM  Result Value Ref Range   Glucose-Capillary 161 (H) 70 - 99 mg/dL  Basic metabolic panel     Status: Abnormal   Collection Time: 05/09/23  5:10 AM  Result Value Ref Range   Sodium 132 (L) 135 - 145 mmol/L   Potassium 3.6 3.5 - 5.1 mmol/L   Chloride 101 98 - 111 mmol/L   CO2 22 22 - 32 mmol/L   Glucose, Bld 172 (H) 70 - 99 mg/dL   BUN 21 8 - 23 mg/dL   Creatinine, Ser 4.09 0.44 - 1.00 mg/dL   Calcium 8.4 (L) 8.9 - 10.3 mg/dL   GFR, Estimated >81 >19 mL/min   Anion gap 9 5 - 15  CBC with Differential/Platelet     Status: Abnormal   Collection Time:  05/09/23  5:10 AM  Result Value Ref Range   WBC 9.7 4.0 - 10.5 K/uL   RBC 3.28 (L) 3.87 - 5.11 MIL/uL   Hemoglobin 10.4 (L) 12.0 - 15.0 g/dL   HCT 14.7 (L) 82.9 - 56.2 %   MCV 97.9 80.0 - 100.0 fL   MCH 31.7 26.0 - 34.0 pg   MCHC 32.4 30.0 - 36.0 g/dL   RDW 13.0 86.5 - 78.4 %   Platelets 154 150 - 400 K/uL   nRBC 0.0 0.0 - 0.2 %   Neutrophils Relative % 80 %   Neutro Abs 7.8 (H) 1.7 - 7.7 K/uL   Lymphocytes Relative 7 %   Lymphs Abs 0.7 0.7 - 4.0 K/uL   Monocytes Relative 11 %   Monocytes Absolute 1.0 0.1 - 1.0 K/uL   Eosinophils Relative 1 %   Eosinophils Absolute 0.1 0.0 - 0.5 K/uL   Basophils Relative 0 %   Basophils Absolute 0.0 0.0 - 0.1 K/uL   Immature Granulocytes 1 %   Abs Immature Granulocytes 0.06 0.00 - 0.07 K/uL  Magnesium     Status: None   Collection Time: 05/09/23  5:10 AM  Result Value Ref Range   Magnesium 1.9 1.7 - 2.4 mg/dL  Glucose, capillary     Status: Abnormal   Collection Time: 05/09/23  7:31 AM  Result Value Ref Range   Glucose-Capillary 153 (H) 70 - 99 mg/dL    I have reviewed pertinent nursing notes, vitals, labs, and images as necessary. I have ordered labwork to follow up on as indicated.  I have reviewed the last notes from staff over past 24 hours. I have discussed patient's care plan and test results with nursing staff, CM/SW, and other staff as appropriate.  Time spent: Greater than 50% of the 55 minute visit was spent in counseling/coordination of care for the patient as laid out in the A&P.  Discussed with caregiver at bedside.    LOS: 3 days   Leeroy Bock, MD [facility] 05/09/2023, 7:36 AM

## 2023-05-09 NOTE — TOC CM/SW Note (Signed)
    Durable Medical Equipment  (From admission, onward)           Start     Ordered   05/09/23 1052  For home use only DME Air overlay mattress  Once        05/09/23 1051   05/07/23 1240  For home use only DME Hospital bed  Once       Question Answer Comment  Length of Need Lifetime   The above medical condition requires: Patient requires the ability to reposition frequently   Bed type Semi-electric      05/07/23 1239

## 2023-05-10 DIAGNOSIS — W19XXXA Unspecified fall, initial encounter: Secondary | ICD-10-CM | POA: Diagnosis not present

## 2023-05-10 DIAGNOSIS — S329XXA Fracture of unspecified parts of lumbosacral spine and pelvis, initial encounter for closed fracture: Secondary | ICD-10-CM | POA: Diagnosis not present

## 2023-05-10 DIAGNOSIS — I4891 Unspecified atrial fibrillation: Secondary | ICD-10-CM | POA: Diagnosis not present

## 2023-05-10 LAB — BASIC METABOLIC PANEL
Anion gap: 9 (ref 5–15)
BUN: 20 mg/dL (ref 8–23)
CO2: 22 mmol/L (ref 22–32)
Calcium: 8.3 mg/dL — ABNORMAL LOW (ref 8.9–10.3)
Chloride: 102 mmol/L (ref 98–111)
Creatinine, Ser: 0.61 mg/dL (ref 0.44–1.00)
GFR, Estimated: >60 mL/min (ref >60)
Glucose, Bld: 187 mg/dL — ABNORMAL HIGH (ref 70–99)
Potassium: 3.7 mmol/L (ref 3.5–5.1)
Sodium: 133 mmol/L — ABNORMAL LOW (ref 135–145)

## 2023-05-10 LAB — CBC WITH DIFFERENTIAL/PLATELET
Abs Immature Granulocytes: 0.07 10*3/uL (ref 0.00–0.07)
Basophils Absolute: 0 10*3/uL (ref 0.0–0.1)
Basophils Relative: 0 %
Eosinophils Absolute: 0.1 10*3/uL (ref 0.0–0.5)
Eosinophils Relative: 1 %
HCT: 32.2 % — ABNORMAL LOW (ref 36.0–46.0)
Hemoglobin: 10.5 g/dL — ABNORMAL LOW (ref 12.0–15.0)
Immature Granulocytes: 1 %
Lymphocytes Relative: 10 %
Lymphs Abs: 0.9 10*3/uL (ref 0.7–4.0)
MCH: 32 pg (ref 26.0–34.0)
MCHC: 32.6 g/dL (ref 30.0–36.0)
MCV: 98.2 fL (ref 80.0–100.0)
Monocytes Absolute: 1 10*3/uL (ref 0.1–1.0)
Monocytes Relative: 12 %
Neutro Abs: 6.7 10*3/uL (ref 1.7–7.7)
Neutrophils Relative %: 76 %
Platelets: 164 10*3/uL (ref 150–400)
RBC: 3.28 MIL/uL — ABNORMAL LOW (ref 3.87–5.11)
RDW: 13.2 % (ref 11.5–15.5)
WBC: 8.8 10*3/uL (ref 4.0–10.5)
nRBC: 0 % (ref 0.0–0.2)

## 2023-05-10 MED ORDER — ONDANSETRON HCL 4 MG PO TABS
4.0000 mg | ORAL_TABLET | Freq: Four times a day (QID) | ORAL | Status: AC
  Filled 2023-05-10: qty 1

## 2023-05-10 MED ORDER — DILTIAZEM HCL ER COATED BEADS 180 MG PO CP24
300.0000 mg | ORAL_CAPSULE | Freq: Every day | ORAL | Status: DC
  Administered 2023-05-11 – 2023-05-12 (×2): 300 mg via ORAL
  Filled 2023-05-10 (×3): qty 1

## 2023-05-10 MED ORDER — DILTIAZEM HCL 25 MG/5ML IV SOLN
15.0000 mg | Freq: Once | INTRAVENOUS | Status: AC
  Administered 2023-05-10: 15 mg via INTRAVENOUS
  Filled 2023-05-10 (×2): qty 5

## 2023-05-10 MED ORDER — ONDANSETRON HCL 4 MG/2ML IJ SOLN
4.0000 mg | Freq: Four times a day (QID) | INTRAMUSCULAR | Status: AC
  Administered 2023-05-11 (×2): 4 mg via INTRAVENOUS
  Filled 2023-05-10 (×3): qty 2

## 2023-05-10 NOTE — Progress Notes (Signed)
Occupational Therapy Treatment Patient Details Name: [name] MRN: [id number] DOB: [birth year] Today's Date: 05/10/2023   History of present illness Patient is a 89 year old female who was admitted with bil superior/inferior pubic rami fractures after a fall at home. Hx of DM, lumbar stenosis, Afib, breast cancer, cervical cancer, OSA, glaucoma   OT comments  The pt was assisted into sitting edge of bed. She initially required min assist for sitting balance, however she presented with noted posterior lean once fatigued, subsequently requiring increased cues and assist to correct. She performed face washing and teeth brushing with assist seated EOB, then initiated self-feeding tasks once back in the high fowler's position. She required intermittent cues to keep her eyes open during the session, as well as cues for sustained attention, problem solving, and sequencing. Her in-home caregiver was present in the room during the session and reported improvement in the pt's functional abilities today, as compared to the previous therapy session. Continue OT plan of care. Patient will benefit from continued inpatient follow up therapy, <3 hours/day.   Recommendations for follow up therapy are one component of a multi-disciplinary discharge planning process, led by the attending physician.  Recommendations may be updated based on patient status, additional functional criteria and insurance authorization.    Assistance Recommended at Discharge  Frequent or Constant Supervision/Assistance  Patient can return home with the following  Two people to help with walking and/or transfers;Assistance with cooking/housework;Direct supervision/assist for medications management;Assist for transportation;Help with stairs or ramp for entrance;Direct supervision/assist for financial management;Assistance with feeding;A lot of help with bathing/dressing/bathroom   Equipment Recommendations  Other (comment) (defer to next  level of care)       Precautions / Restrictions Precautions Precautions: Fall Restrictions Weight Bearing Restrictions: No RLE Weight Bearing: Weight bearing as tolerated LLE Weight Bearing: Weight bearing as tolerated Other Position/Activity Restrictions: WBAT              ADL either performed or assessed with clinical judgement   ADL Overall ADL's : Needs assistance/impaired Eating/Feeding: Moderate assistance;Bed level Eating/Feeding Details (indicate cue type and reason): OT provided instruction for self-feeding with the pt in the high fowler's position in bed. She was able to grasp a spoon, scoop mashed potatoes, and bring the food to her mouth on 3 of 3 instances. She required verbal cues to open her eyes, to initiate task, and for sustained attention. While seated EOB, she was also able to grasp a cup, bring it to her mouth, and sip from the straw, though needing min-mod assist to maintain sitting balance. Grooming: Moderate assistance;Sitting Grooming Details (indicate cue type and reason): The pt performed face washing and teeth brushing seated EOB. She required min-mod assist for sitting balance, even occasional posterior lean. She also required intermittent cues to keep her eyes open and for problem-solving and sequencing of tasks.         Upper Body Dressing : Maximal assistance;Sitting Upper Body Dressing Details (indicate cue type and reason): simulated Lower Body Dressing: Bed level;Total assistance       Toileting- Clothing Manipulation and Hygiene: Bed level;Total assistance                         Cognition Arousal/Alertness: Awake/alert Behavior During Therapy: WFL for tasks assessed/performed        General Comments: Pt's caregiver stated the pt has been presenting with increased confusion during her hospital stay, as compared to her baseline  Pertinent Vitals/ Pain       Pain Assessment Pain Assessment: Faces Pain  Score: 6  Pain Location: pelvis with activity Pain Intervention(s): Limited activity within patient's tolerance, Monitored during session, Repositioned         Frequency  Min 1X/week        Progress Toward Goals  OT Goals(current goals can now be found in the care plan section)  Progress towards OT goals: Progressing toward goals  Acute Rehab OT Goals OT Goal Formulation: Patient unable to participate in goal setting Time For Goal Achievement: 05/21/23 Potential to Achieve Goals: Fair  Plan Discharge plan remains appropriate       AM-PAC OT "6 Clicks" Daily Activity     Outcome Measure   Help from another person eating meals?: A Lot Help from another person taking care of personal grooming?: A Lot Help from another person toileting, which includes using toliet, bedpan, or urinal?: Total Help from another person bathing (including washing, rinsing, drying)?: A Lot Help from another person to put on and taking off regular upper body clothing?: A Lot Help from another person to put on and taking off regular lower body clothing?: Total 6 Click Score: 10    End of Session    OT Visit Diagnosis: Unsteadiness on feet (R26.81);Pain;History of falling (Z91.81);Muscle weakness (generalized) (M62.81)   Activity Tolerance  Fair tolerance; limited by pain   Patient Left in bed;with call bell/phone within reach;with bed alarm set;with family/visitor present   Nurse Communication Other (comment). Nurse present in room at end of session         Time: [phone] OT Time Calculation (min): 21 min  Charges: OT General Charges $OT Visit: 1 Visit OT Treatments $Self Care/Home Management : 8-22 mins     Reuben Likes, OTR/L 05/10/2023, 5:58 PM

## 2023-05-10 NOTE — Evaluation (Signed)
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   Cervical cancer (HCC)    COLONIC POLYPS, ADENOMATOUS, HX OF 10/13/2010   Qualifier: Diagnosis of  By: Koleen Distance CMA (AAMA), Leisha     Dilated cardiomyopathy (HCC) 08/27/2018   DJD (degenerative joint disease) of knee    EROSIVE ESOPHAGITIS 10/13/2010   Qualifier: Diagnosis of  By: Koleen Distance CMA Duncan Dull), Hulan Saas     Essential hypertension 10/13/2010   Qualifier: Diagnosis of  By: Koleen Distance CMA (AAMA), Baird Cancer, intermittent 07/09/2012   GERD (gastroesophageal reflux disease)    Glaucoma    Hiatal hernia s/p robotic repair & fundoplication 05/25/2017 10/13/2010   Qualifier: Diagnosis of  By: Koleen Distance CMA (AAMA), Hulan Saas     History of laser assisted in situ keratomileusis 07/25/2012   HYPERCHOLESTEROLEMIA 10/13/2010   Qualifier: Diagnosis of  By: Koleen Distance CMA (AAMA), Leisha     Hypothyroidism, postsurgical    h/o thyroid cancer s/p thyroidectomy   Iron Deficiency Anemia 10/14/2010   Qualifier: Diagnosis of  By: Jarold Motto MD Mervyn Skeeters R    Irritable bowel syndrome 10/13/2010   Qualifier: Diagnosis of  By: Koleen Distance CMA (AAMA), Leisha     Lumbar stenosis with neurogenic claudication 04/20/2016   OSA on CPAP    settings at 10-11    Type II diabetes mellitus (HCC)    type II    Past Surgical History:  Past Surgical History:  Procedure Laterality Date   ABDOMINAL HYSTERECTOMY      "partial"   BIOPSY  01/15/2023   Procedure: BIOPSY;  Surgeon: Hilarie Fredrickson, MD;  Location: [facility] END[facility]COPY;  Service: Gastroenterology;;   BREAST BIOPSY Right    BREAST LUMPECTOMY Right    CARDIOVERSION N/A 03/30/2014   Procedure: CARDIOVERSION - BEDSIDE;  Surgeon: Wendall Stade, MD;  Location: [facility] OR;  Service: Cardiovascular;  Laterality: N/A;   CARDIOVERSION N/A 03/31/2014   Procedure: CARDIOVERSION  (BEDSIDE) ;  Surgeon: Lewayne Bunting, MD;  Location: [facility] OR;  Service: Cardiovascular;  Laterality: N/A;   CARDIOVERSION N/A 04/27/2014   Procedure: CARDIOVERSION;  Surgeon: Cassell Clement, MD;  Location: [facility] ENDOSCOPY;  Service: Cardiovascular;  Laterality: N/A;   CARDIOVERSION N/A 04/13/2015   Procedure: CARDIOVERSION;  Surgeon: Vesta Mixer, MD;  Location: [facility] ENDOSCOPY;  Service: Cardiovascular;  Laterality: N/A;   CARDIOVERSION N/A 01/08/2017   Procedure: CARDIOVERSION;  Surgeon: Thurmon Fair, MD;  Location: [facility] ENDOSCOPY;  Service: Cardiovascular;  Laterality: N/A;   CARDIOVERSION N/A 09/19/2017   Procedure: CARDIOVERSION;  Surgeon: Wendall Stade, MD;  Location: [facility] ENDOSCOPY;  Service: Cardiovascular;  Laterality: N/A;   CARDIOVERSION N/A 10/25/2017   Procedure: CARDIOVERSION;  Surgeon: Elease Hashimoto Deloris Ping, MD;  Location: [facility] ENDOSCOPY;  Service: Cardiovascular;  Laterality: N/A;   CARDIOVERSION N/A 12/27/2018   Procedure: CARDIOVERSION;  Surgeon: Parke Poisson, MD;  Location: [facility] ENDOSCOPY;  Service: Cardiovascular;  Laterality: N/A;   CARDIOVERSION  N/A 05/20/2019   Procedure: CARDIOVERSION;  Surgeon: Parke Poisson, MD;  Location: [facility] ENDOSCOPY;  Service: Cardiovascular;  Laterality: N/A;   CARDIOVERSION N/A 12/10/2019   Procedure: CARDIOVERSION;  Surgeon: Chrystie Nose, MD;  Location: [facility] ENDOSCOPY;  Service: Cardiovascular;  Laterality: N/A;   CARDIOVERSION N/A 12/31/2019   Procedure: CARDIOVERSION;  Surgeon: Thurmon Fair, MD;  Location: [facility] ENDOSCOPY;  Service: Cardiovascular;   Laterality: N/A;   CARDIOVERSION N/A 10/21/2020   Procedure: CARDIOVERSION;  Surgeon: Parke Poisson, MD;  Location: [facility] ENDOSCOPY;  Service: Cardiovascular;  Laterality: N/A;   CARDIOVERSION N/A 12/03/2020   Procedure: CARDIOVERSION;  Surgeon: Jake Bathe, MD;  Location: [facility] ENDOSCOPY;  Service: Cardiovascular;  Laterality: N/A;   CARDIOVERSION N/A 07/11/2021   Procedure: CARDIOVERSION;  Surgeon: Chrystie Nose, MD;  Location: [facility] ENDOSCOPY;  Service: Cardiovascular;  Laterality: N/A;   CARPAL TUNNEL RELEASE Right    CATARACT EXTRACTION W/ INTRAOCULAR LENS  IMPLANT, BILATERAL Bilateral    COLONOSCOPY     COLONOSCOPY N/A 01/15/2023   Procedure: COLONOSCOPY;  Surgeon: Hilarie Fredrickson, MD;  Location: [facility] ENDOSCOPY;  Service: Gastroenterology;  Laterality: N/A;   EXCISIONAL HEMORRHOIDECTOMY     INSERTION OF MESH N/A 05/25/2017   Procedure: INSERTION OF MESH;  Surgeon: Axel Filler, MD;  Location: [facility] ORS;  Service: General;  Laterality: N/A;   TEE WITHOUT CARDIOVERSION N/A 03/30/2014   Procedure: TRANSESOPHAGEAL ECHOCARDIOGRAM (TEE);  Surgeon: Wendall Stade, MD;  Location: [facility] ENDOSCOPY;  Service: Cardiovascular;  Laterality: N/A;   THYROIDECTOMY     TONSILLECTOMY     HPI:  89 year old female with PMH A-fib on Eliquis, DM II, HTN, HLD, GERD, thyroid cancer,hypothyroidism who presented to the hospital after a witnessed and mechanical fall on 04/27/2023.  Patient was attempting to stand up and had lost footing falling to the ground pelvic fx, abdomen imaging:  Small low-density right pleural effusion. No pneumothorax. 3. Patulous esophagus and small hiatal hernia with distal esophageal wall thickening. Correlate for esophagitis. Rt pleural effusion, h/o nissen, intact at esophagram 2019    Assessment / Plan / Recommendation  Clinical Impression  Patient demonstrates clinical indication of oral pharyngeal deficits currently largely impacted by patient's xerostomia, sleepiness, and weakness.  These  factors result in demonstrating prolonged mastication with delayed oral transiting and oral retention.  She benefited from use of mashed potato to transit solids that are dense such as salmon, into pharynx.  Thin liquid given via straw resulted in occasional extended breath-holding, inhalation Po swallow with strong coughing episode toward end of meal.  Suspect patient may have aspirated with broth at completion of meal due to her respiratory efforts.  She only consumed approximately 10% of her meal before respiratory rate increased to high 20s low 30s and requiring break.  Caregiver present does an excellent job of caring for Ms. Orvan Falconer.  SLP educated patient and caregiver to dysphagia negation strategies including using raise to transit masticated solids, drinking small single sips via straw cup or even decreasing to teaspoon if prevents patient from coughing, and having patient consume small frequent meal.  Patient has known history of esophageal dysmotility and thus precautions are indicated.  Given patient's advanced age and her decreased mobility because of her pelvic fracture along with her dysphagia, SLP would recommend to consider and palliative care consult to help with goals.  Posted swallow precautions sign and educated caregiver using teach back with written cautions posted.  SLP will follow-up.  Thank you for this consult SLP Visit Diagnosis:  Dysphagia, oropharyngeal phase (R13.12)    Aspiration Risk  Moderate aspiration risk;Risk for inadequate nutrition/hydration    Diet Recommendation Dysphagia 3 (Mech soft);Thin liquid    Liquid Administration via: Spoon;Cup;Straw Medication Administration: Crushed with puree Supervision: Full supervision/cueing for compensatory strategies Compensations: Slow rate;Small sips/bites;Other (Comment) (Use pure to transit solids into pharynx as needed) Postural Changes: Seated upright at 90 degrees;Remain upright for at least 30 minutes after po intake     Other  Recommendations Oral Care Recommendations: Oral care BID Caregiver Recommendations: Have oral suction available    Recommendations for follow up therapy are one component of a multi-disciplinary discharge planning process, led by the attending physician.  Recommendations may be updated based on patient status, additional functional criteria and insurance authorization.  Follow up Recommendations Follow physician's recommendations for discharge plan and follow up therapies      Assistance Recommended at Discharge    Functional Status Assessment Patient has had a recent decline in their functional status and demonstrates the ability to make significant improvements in function in a reasonable and predictable amount of time.  Frequency and Duration min 1 x/week  1 week       Prognosis Prognosis for improved oropharyngeal function: Fair Barriers to Reach Goals: Cognitive deficits      Swallow Study   General Date of Onset: 05/10/23 (since admission) HPI: 89 year old female with PMH A-fib on Eliquis, DM II, HTN, HLD, GERD, thyroid cancer,hypothyroidism who presented to the hospital after a witnessed and mechanical fall on 05-14-2023.  Patient was attempting to stand up and had lost footing falling to the ground pelvic fx, abdomen imaging:  Small low-density right pleural effusion. No pneumothorax. 3. Patulous esophagus and small hiatal hernia with distal esophageal wall thickening. Correlate for esophagitis. Rt pleural effusion, h/o nissen, intact at esophagram 2019 Type of Study: Bedside Swallow Evaluation Diet Prior to this Study: Dysphagia 3 (mechanical soft);Thin liquids (Level 0) Temperature Spikes Noted: No Respiratory Status: Nasal cannula History of Recent Intubation: No Behavior/Cognition: Alert;Cooperative;Distractible;Pleasant mood Oral Cavity Assessment: Within Functional Limits Oral Care Completed by SLP: Recent completion by staff Oral Cavity - Dentition: Adequate  natural dentition Self-Feeding Abilities: Needs assist Patient Positioning: Upright in bed Baseline Vocal Quality: Low vocal intensity Volitional Cough: Strong Volitional Swallow: Unable to elicit    Oral/Motor/Sensory Function Overall Oral Motor/Sensory Function: Mild impairment Facial Symmetry: Abnormal symmetry left Facial Strength: Within Functional Limits Facial Sensation: Within Functional Limits Lingual ROM:  (weak) Lingual Symmetry: Within Functional Limits Lingual Strength: Reduced Velum: Within Functional Limits   Ice Chips Ice chips: Not tested   Thin Liquid Thin Liquid: Impaired Presentation: Straw Oral Phase Impairments: Reduced lingual movement/coordination Pharyngeal  Phase Impairments: Cough - Delayed;Change in Vital Signs;Multiple swallows    Nectar Thick Nectar Thick Liquid: Not tested   Honey Thick     Puree Puree: Impaired Presentation: Spoon Oral Phase Impairments: Reduced lingual movement/coordination Oral Phase Functional Implications: Prolonged oral transit   Solid     Solid: Impaired Presentation: Spoon Oral Phase Impairments: Impaired mastication;Reduced lingual movement/coordination Oral Phase Functional Implications: Impaired mastication;Prolonged oral transit;Oral residue Pharyngeal Phase Impairments: Cough - Delayed (delayed subtle cough)      [name] 05/10/2023,4:01 PM   Natasha Infante, MS [facility] SLP Acute Rehab Services Office [phone]

## 2023-05-10 NOTE — Progress Notes (Signed)
Progress Note [name]   WNU:[id number]  DOB: 10-Sep-1931  DOA: 2023/05/16     4 PCP: Joya Martyr, MD  Initial CC: fall at home  Hospital Course: [name] is a 89 year old female with PMH A-fib on Eliquis, DM II, HTN, HLD, hypothyroidism who presented to the hospital after a witnessed and mechanical fall on May 16, 2023.  Patient was attempting to stand up and had lost footing falling to the ground. She underwent multiple imaging studies on workup in the ER.  She was also noted to be in A-fib with RVR and was initiated on a Cardizem drip. Imaging studies showed a bilateral comminuted mildly displaced fractures of the inferior and superior pubic rami extending into the pubic body bilaterally.  There was also noted to be adjacent extraperitoneal blood products in the pelvis. Eliquis was held on admission.  Orthopedic surgery was also consulted and recommended nonoperative management with supportive care and weightbearing as tolerated.  Patient is being treated with pain management for her pelvic fracture and denies pain currently while at rest. PT/OT evaluated and recommended HH vs SNF.   6/26: Afib RVR was well controlled on cardizem gtt overnight so being transitioned off today after her home medications were resumed. She is asymptomatic. Eliquis is being restarted.  6/27: HR is still unstable and intermittently jumps up to 100-130 despite increase of home dose of Cardizem. Trial of lopressor did not change. Will give another dose of cadizem at increased dose and if not able to better control HR, will consult cards.  Subjective: Patient states that she feels unwell today. She endorses that she is nauseous. Denies chest pain or SOB. Endorses pain in pelvis with movement. Caregiver at bedside states that patient continues to have difficulty with chewing/swallowing that is not her baseline.   Assessment and Plan: Pelvic fracture- S/p mechanical fall at home.  Imaging studies show  "Bilateral comminuted mildly displaced fractures of the inferior and superior pubic rami extending into the pubic body bilaterally. Adjacent extraperitoneal blood products in the pelvis" -Weightbearing as tolerated per orthopedic surgery.  No operative management necessary -PT/OT evals.  Family still wishes to bring patient back home with ongoing caretakers -Pain control is main issue; she also cannot get out of bed yet which also may take possibly even several days to weeks -Start on scheduled Tylenol and scheduled Robaxin -Continue PRN morphine and oxycodone; continue laxative regimen -Outpatient follow-up with Dr. Linna Caprice in 2 to 3 weeks for repeat imaging  Persistent atrial fibrillation (HCC)- Patient noted to be in RVR on admission likely pain induced from traumatic fractures. TSH normal; slightly elevated FT4 but has been slowly improving - Initially started on Cardizem drip.  Resuming home Cardizem and Lopressor - weaned off cardizem gtt 6/26.  - home cardizem PO at higher dose and metoprolol are continued.  - hgb stable after restarting eliquis - continue trending H/H - continue telemetry  DMII (diabetes mellitus, type 2) (HCC) - A1c 7.3%; previously 5.9% about 4 months ago which are both below goal for age.  - daily CBG monitoring  Post-surgical hypothyroidism - Previously had elevated TSH and was felt due to noncompliance due to forgetfulness.  - she has now been having caretakers and living with family - Most recent TSH now 02/21/2023 and is normal range, 2.290 - repeat TSH 1.2 (prior 1.67) and FT4 1.25 (and prior 1.67) - synthroid in Feb 2024 was 125 mcg daily and now is 150 mcg daily; her TSH has downtrended some after this  although FT4 came down some too - for now, can continue current dose, but needs repeat TSH and FT4 again soon and if TSH becoming too suppressed may need to lower dose again esp in setting of age and RVR with afib   Depression with anxiety - Continue  Zoloft  Physical deconditioning - at risk for being bed bound with progressive decline - family wishing for taking patient home at d/c with ongoing caregivers  - PT/OT/SLP  GERD - Continue Protonix  Essential hypertension - Continue Cardizem and Lopressor  Old records reviewed in assessment of this patient  DVT prophylaxis:  apixaban (ELIQUIS) tablet 2.5 mg Start: 05/09/23 1000 SCDs Start: 05/06/23 0735 apixaban (ELIQUIS) tablet 2.5 mg   Code Status:   Code Status: Full Code  Mobility Assessment (last 72 hours)     Mobility Assessment     Row Name 05/09/23 2000 05/09/23 1313 05/09/23 0845 05/08/23 2200 05/08/23 1149   Does patient have an order for bedrest or is patient medically unstable Yes- Bedfast (Level 1) - Complete -- Yes- Bedfast (Level 1) - Complete Yes- Bedfast (Level 1) - Complete Yes- Bedfast (Level 1) - Complete   What is the highest level of mobility based on the progressive mobility assessment? Level 1 (Bedfast) - Unable to balance while sitting on edge of bed Level 1 (Bedfast) - Unable to balance while sitting on edge of bed Level 1 (Bedfast) - Unable to balance while sitting on edge of bed Level 1 (Bedfast) - Unable to balance while sitting on edge of bed Level 1 (Bedfast) - Unable to balance while sitting on edge of bed   Is the above level different from baseline mobility prior to current illness? Yes - Recommend PT order -- Yes - Recommend PT order Yes - Recommend PT order --    Row Name 05/07/23 2000 05/07/23 1209 05/07/23 1610       Does patient have an order for bedrest or is patient medically unstable Yes- Bedfast (Level 1) - Complete -- --     What is the highest level of mobility based on the progressive mobility assessment? Level 1 (Bedfast) - Unable to balance while sitting on edge of bed Level 1 (Bedfast) - Unable to balance while sitting on edge of bed Level 1 (Bedfast) - Unable to balance while sitting on edge of bed     Is the above level different  from baseline mobility prior to current illness? Yes - Recommend PT order -- --             Barriers to discharge: pain control and HR control Disposition Plan:  Home with HH and caretakers Status is: Inpt  Objective: Blood pressure 116/79, pulse (!) 48, temperature 98.3 F (36.8 C), temperature source Axillary, resp. rate (!) 23, height 5\' 6"  (1.676 m), weight 60.9 kg, SpO2 97 %.  Examination:  Physical Exam Constitutional:      Comments: Pleasant elderly woman lying in bed in no distress but appears uncomfortable with lower extremity movements  HENT:     Head: Normocephalic and atraumatic.     Mouth/Throat:     Mouth: Mucous membranes are moist.  Eyes:     Extraocular Movements: Extraocular movements intact.  Cardiovascular:     Rate and Rhythm: Tachycardia present. Rhythm irregular.  Pulmonary:     Effort: Pulmonary effort is normal. No respiratory distress.     Breath sounds: Normal breath sounds. No wheezing.  Abdominal:     General: Bowel sounds are  normal. There is no distension.     Palpations: Abdomen is soft.     Tenderness: There is no abdominal tenderness.  Musculoskeletal:     Cervical back: Normal range of motion and neck supple.     Comments: Exquisite pain with LE passive ROM bilaterally  Skin:    General: Skin is warm and dry.  Neurological:     Comments: Follows commands, moves all 4 extremities     Consultants:  Orthopedic surgery  Procedures:  None   Data Reviewed: Results for orders placed or performed during the hospital encounter of 04/24/2023 (from the past 24 hour(s))  Glucose, capillary     Status: Abnormal   Collection Time: 05/09/23 11:33 AM  Result Value Ref Range   Glucose-Capillary 189 (H) 70 - 99 mg/dL  Glucose, capillary     Status: Abnormal   Collection Time: 05/09/23  4:29 PM  Result Value Ref Range   Glucose-Capillary 171 (H) 70 - 99 mg/dL  Basic metabolic panel     Status: Abnormal   Collection Time: 05/10/23  5:28 AM   Result Value Ref Range   Sodium 133 (L) 135 - 145 mmol/L   Potassium 3.7 3.5 - 5.1 mmol/L   Chloride 102 98 - 111 mmol/L   CO2 22 22 - 32 mmol/L   Glucose, Bld 187 (H) 70 - 99 mg/dL   BUN 20 8 - 23 mg/dL   Creatinine, Ser 1.30 0.44 - 1.00 mg/dL   Calcium 8.3 (L) 8.9 - 10.3 mg/dL   GFR, Estimated >86 >57 mL/min   Anion gap 9 5 - 15  CBC with Differential/Platelet     Status: Abnormal   Collection Time: 05/10/23  5:28 AM  Result Value Ref Range   WBC 8.8 4.0 - 10.5 K/uL   RBC 3.28 (L) 3.87 - 5.11 MIL/uL   Hemoglobin 10.5 (L) 12.0 - 15.0 g/dL   HCT 84.6 (L) 96.2 - 95.2 %   MCV 98.2 80.0 - 100.0 fL   MCH 32.0 26.0 - 34.0 pg   MCHC 32.6 30.0 - 36.0 g/dL   RDW 84.1 32.4 - 40.1 %   Platelets 164 150 - 400 K/uL   nRBC 0.0 0.0 - 0.2 %   Neutrophils Relative % 76 %   Neutro Abs 6.7 1.7 - 7.7 K/uL   Lymphocytes Relative 10 %   Lymphs Abs 0.9 0.7 - 4.0 K/uL   Monocytes Relative 12 %   Monocytes Absolute 1.0 0.1 - 1.0 K/uL   Eosinophils Relative 1 %   Eosinophils Absolute 0.1 0.0 - 0.5 K/uL   Basophils Relative 0 %   Basophils Absolute 0.0 0.0 - 0.1 K/uL   Immature Granulocytes 1 %   Abs Immature Granulocytes 0.07 0.00 - 0.07 K/uL    I have reviewed pertinent nursing notes, vitals, labs, and images as necessary. I have ordered labwork to follow up on as indicated.  I have reviewed the last notes from staff over past 24 hours. I have discussed patient's care plan and test results with nursing staff, CM/SW, and other staff as appropriate.  Time spent: Greater than 50% of the 55 minute visit was spent in counseling/coordination of care for the patient as laid out in the A&P.  Discussed with caregiver at bedside.    LOS: 4 days   Leeroy Bock, MD [facility] 05/10/2023, 7:52 AM

## 2023-05-11 DIAGNOSIS — S32810A Multiple fractures of pelvis with stable disruption of pelvic ring, initial encounter for closed fracture: Secondary | ICD-10-CM | POA: Diagnosis not present

## 2023-05-11 LAB — CBC WITH DIFFERENTIAL/PLATELET
Abs Immature Granulocytes: 0.07 10*3/uL (ref 0.00–0.07)
Basophils Absolute: 0 10*3/uL (ref 0.0–0.1)
Basophils Relative: 0 %
Eosinophils Absolute: 0.1 10*3/uL (ref 0.0–0.5)
Eosinophils Relative: 1 %
HCT: 31.9 % — ABNORMAL LOW (ref 36.0–46.0)
Hemoglobin: 10.5 g/dL — ABNORMAL LOW (ref 12.0–15.0)
Immature Granulocytes: 1 %
Lymphocytes Relative: 11 %
Lymphs Abs: 1 10*3/uL (ref 0.7–4.0)
MCH: 31.9 pg (ref 26.0–34.0)
MCHC: 32.9 g/dL (ref 30.0–36.0)
MCV: 97 fL (ref 80.0–100.0)
Monocytes Absolute: 1 10*3/uL (ref 0.1–1.0)
Monocytes Relative: 12 %
Neutro Abs: 6.9 10*3/uL (ref 1.7–7.7)
Neutrophils Relative %: 75 %
Platelets: 208 10*3/uL (ref 150–400)
RBC: 3.29 MIL/uL — ABNORMAL LOW (ref 3.87–5.11)
RDW: 13.4 % (ref 11.5–15.5)
WBC: 9 10*3/uL (ref 4.0–10.5)
nRBC: 0 % (ref 0.0–0.2)

## 2023-05-11 LAB — BASIC METABOLIC PANEL
Anion gap: 8 (ref 5–15)
BUN: 21 mg/dL (ref 8–23)
CO2: 21 mmol/L — ABNORMAL LOW (ref 22–32)
Calcium: 8.5 mg/dL — ABNORMAL LOW (ref 8.9–10.3)
Chloride: 103 mmol/L (ref 98–111)
Creatinine, Ser: 0.61 mg/dL (ref 0.44–1.00)
GFR, Estimated: >60 mL/min (ref >60)
Glucose, Bld: 179 mg/dL — ABNORMAL HIGH (ref 70–99)
Potassium: 3.9 mmol/L (ref 3.5–5.1)
Sodium: 132 mmol/L — ABNORMAL LOW (ref 135–145)

## 2023-05-11 MED ORDER — METHOCARBAMOL 500 MG PO TABS
500.0000 mg | ORAL_TABLET | Freq: Three times a day (TID) | ORAL | Status: DC | PRN
  Administered 2023-05-11: 500 mg via ORAL
  Filled 2023-05-11: qty 1

## 2023-05-11 MED ORDER — SENNOSIDES-DOCUSATE SODIUM 8.6-50 MG PO TABS
1.0000 | ORAL_TABLET | Freq: Every day | ORAL | Status: DC
  Administered 2023-05-11 – 2023-05-12 (×2): 1 via ORAL
  Filled 2023-05-11 (×2): qty 1

## 2023-05-11 MED ORDER — DILTIAZEM HCL 60 MG PO TABS: 60.0000 mg | ORAL_TABLET | Freq: Three times a day (TID) | ORAL | Status: DC | PRN

## 2023-05-11 MED ORDER — ACETAMINOPHEN 500 MG PO TABS
1000.0000 mg | ORAL_TABLET | Freq: Three times a day (TID) | ORAL | Status: DC
  Administered 2023-05-11 – 2023-05-13 (×6): 1000 mg via ORAL
  Filled 2023-05-11 (×6): qty 2

## 2023-05-11 MED ORDER — OXYCODONE HCL 5 MG PO TABS
5.0000 mg | ORAL_TABLET | Freq: Four times a day (QID) | ORAL | Status: DC
  Administered 2023-05-11 – 2023-05-13 (×9): 5 mg via ORAL
  Filled 2023-05-11 (×9): qty 1

## 2023-05-11 NOTE — Care Management Important Message (Signed)
Important Message  Patient Details IM Letter given. Name: SHADORA HEYNEN MRN: [id number] Date of Birth: August 08, 1931   Medicare Important Message Given:  Yes     Caren Macadam 05/11/2023, 12:03 PM

## 2023-05-11 NOTE — Progress Notes (Signed)
Physical Therapy Treatment Patient Details Name: [name] MRN: [id number] DOB: May 19, 1931 Today's Date: 05/11/2023   History of Present Illness Patient is a 89 year old female who was admitted with bil superior/inferior pubic rami fractures after a fall at home. Hx of DM, lumbar stenosis, Afib, breast cancer, cervical cancer, OSA, glaucoma    PT Comments    The patient yells out with any movement, rolled to each side with total assistance. Patient unable to tolerate sitting upright nor on bed edge.   Recommendations for follow up therapy are one component of a multi-disciplinary discharge planning process, led by the attending physician.  Recommendations may be updated based on patient status, additional functional criteria and insurance authorization.  Follow Up Recommendations  Can patient physically be transported by private vehicle: No    Assistance Recommended at Discharge Frequent or constant Supervision/Assistance  Patient can return home with the following Assistance with cooking/housework;Assist for transportation;Help with stairs or ramp for entrance;Two people to help with walking and/or transfers;Two people to help with bathing/dressing/bathroom   Equipment Recommendations  None recommended by PT    Recommendations for Other Services       Precautions / Restrictions Precautions Precautions: Fall Precaution Comments: very painful, HR  into 140's, Restrictions RLE Weight Bearing: Weight bearing as tolerated LLE Weight Bearing: Weight bearing as tolerated Other Position/Activity Restrictions: WBAT     Mobility  Bed Mobility Overal bed mobility: Needs Assistance Bed Mobility: Rolling Rolling: Max assist, +2 for safety/equipment         General bed mobility comments: patient incontinent of BM. Assisted with rolling to each side, patient resistive and yelling out with any movement and turning. patient unable to attempt sitting on bed edge.    Transfers                    General transfer comment: NT-not yet able 2* pain    Ambulation/Gait                   Stairs             Wheelchair Mobility    Modified Rankin (Stroke Patients Only)       Balance       Sitting balance - Comments: unable                                    Cognition Arousal/Alertness: Awake/alert Behavior During Therapy: Anxious Overall Cognitive Status: Difficult to assess                                 General Comments: states, Just move me and inch at a time        Exercises      General Comments        Pertinent Vitals/Pain Pain Assessment Faces Pain Scale: Hurts worst Pain Location: pelvis with activity. yells out. Pain Descriptors / Indicators: Grimacing, Moaning Pain Intervention(s): Premedicated before session    Home Living                          Prior Function            PT Goals (current goals can now be found in the care plan section) Progress towards PT goals: Not progressing toward goals - comment (still in pain with  mobility)    Frequency    Min 1X/week      PT Plan Current plan remains appropriate    Co-evaluation              AM-PAC PT "6 Clicks" Mobility   Outcome Measure  Help needed turning from your back to your side while in a flat bed without using bedrails?: Total Help needed moving from lying on your back to sitting on the side of a flat bed without using bedrails?: Total Help needed moving to and from a bed to a chair (including a wheelchair)?: Total Help needed standing up from a chair using your arms (e.g., wheelchair or bedside chair)?: Total Help needed to walk in hospital room?: Total Help needed climbing 3-5 steps with a railing? : Total 6 Click Score: 6    End of Session   Activity Tolerance: Patient limited by pain;Patient limited by fatigue Patient left: in bed;with call bell/phone within reach;with bed alarm  set;with family/visitor present Nurse Communication: Mobility status PT Visit Diagnosis: Pain;Other abnormalities of gait and mobility (R26.89);Muscle weakness (generalized) (M62.81) Pain - part of body: Leg     Time: 1610-9604 PT Time Calculation (min) (ACUTE ONLY): 16 min  Charges:  $Therapeutic Activity: 8-22 mins                     Blanchard Kelch PT Acute Rehabilitation Services Office [phone] Weekend [phone]    Rada Hay 05/11/2023, 3:34 PM

## 2023-05-11 NOTE — Progress Notes (Signed)
PROGRESS NOTE  [name]  DOB: [birth year]  PCP: Joya Martyr, MD EXB:[id number]  DOA: 04/20/2023  LOS: 5 days  Hospital Day: 7  Brief narrative: [name] is a 89 y.o. female with PMH significant for OSA on CPAP, DM2, HTN, HLD, DCM, A-fib on Eliquis, hypothyroidism, chronic anemia, lumbar spinal stenosis, breast cancer s/p lumpectomy, cervical cancer, Barrett's esophagus 6/22, patient was brought to the hospital after a witnessed mechanical fall.  Patient was apparently attempting to stand up and lost balance and fell to the ground   ED, she was in A-fib with RVR and was started on Cardizem drip. Skeletal survey in the ED showed bilateral comminuted mildly displaced fracture of the inferior PRN superior pubic rami extending into the irregularity bilaterally.  There was also noted to be adjacent extraperitoneal blood products in the pelvis.   Eliquis was held Orthopedics recommended nonoperative management Admitted to [facility] for pain management and disposition PT OT recommended home health versus SNF.  Subjective: Patient was seen and examined this morning.  Elderly Caucasian female.  Sleeping, opens eyes on verbal command.  Somnolent throughout the conversation otherwise.  Daughter at bedside.  Most of the conversation was with her. Chart reviewed. Continues to wildly fluctuant heart rate rate was in 120s last night.  Assessment and plan: A-fib with RVR Prior history of A-fib that was controlled on Cardizem 120 mg daily and Lopressor 100 mg twice daily at home. On admission, she was in RVR probably due to pain.  Started on Cardizem drip which was gradually weaned off on 6/26. She was resumed on home dose of metoprolol 100 mg twice daily and higher dose of Cardizem at 300 mg daily.  Despite that, patient continues to have elevated heart rate in 120s today.  I added short acting Cardizem 3 times daily.  Continue to monitor Anticoagulation with Eliquis has been  resumed.  Traumatic pelvic fracture s/p mechanical fall at home.  Imaging showed bilateral comminuted mildly displaced fractures of the inferior and superior pubic rami extending into the pubic body bilaterally. Adjacent extraperitoneal blood products in the pelvis Nonoperative management per orthopedics PT/OT eval obtained. Family still wishes to bring patient back home with ongoing caretakers Pain control is main issue holding her here in the hospital. Plan scheduled Tylenol, scheduled Roxicodone, scheduled Robaxin, as needed morphine.  Switch Robaxin to as needed Bowel regimen to continue Outpatient follow-up with Dr. Linna Caprice in 2 to 3 weeks for repeat imaging  Essential hypertension Blood pressure controlled on Cardizem and metoprolol  Type 2 diabetes mellitus A1c 7.3 on 05/06/2023 Not on home meds. Currently on SSI/Accu-Cheks Recent Labs  Lab 05/08/23 1634 05/08/23 2024 05/09/23 0731 05/09/23 1133 05/09/23 1629  GLUCAP 91 161* 153* 189* 171*   Post-surgical hypothyroidism Continue Synthroid  Chronic anemia GERD Continue Protonix, iron supplement.  On chronic anticoagulation.  No active bleeding Recent Labs    01/13/23 0543 01/14/23 0539 05/06/23 0009 05/08/23 0445 05/09/23 0510 05/10/23 0528 05/11/23 0523  HGB 10.9*   < > 12.2 9.0* 10.4* 10.5* 10.5*  MCV 102.1*   < >  --  100.4* 97.9 98.2 97.0  VITAMINB12 1,335*  --   --   --   --   --   --   FERRITIN 58  --   --   --   --   --   --   TIBC 244*  --   --   --   --   --   --  IRON 52  --   --   --   --   --   --    < > = values in this interval not displayed.   HLD Crestor to continue   Depression with anxiety Continue Zoloft 50 mg daily   Physical deconditioning Impaired mobility at risk for being bed bound with progressive decline PT OT eval obtained. Family wishing for taking patient home at d/c with ongoing caregivers     Goals of care   Code Status: Full Code     DVT prophylaxis:   apixaban (ELIQUIS) tablet 2.5 mg Start: 05/09/23 1000 SCDs Start: 05/06/23 0735 apixaban (ELIQUIS) tablet 2.5 mg   Antimicrobials: None Fluid: None Consultants: None Family Communication: Daughter at bedside  Status: Inpatient Level of care:  Telemetry   Patient from: Home Anticipated d/c to: Hopefully home in 1 to 2 days Needs to continue in-hospital care:  Remains in A-fib with RVR.  Medicines being titrated   Diet:  Diet Order             DIET DYS 3 Room service appropriate? Yes; Fluid consistency: Thin  Diet effective now                   Scheduled Meds:  acetaminophen  650 mg Oral Q6H   apixaban  2.5 mg Oral BID   diltiazem  300 mg Oral Daily   diltiazem  120 mg Oral Once   ferrous sulfate  325 mg Oral Q breakfast   levothyroxine  150 mcg Oral QAC breakfast   LORazepam  0.5 mg Oral QHS   methocarbamol  500 mg Oral TID   metoprolol tartrate  100 mg Oral BID   ondansetron  4 mg Oral Q6H   Or   ondansetron (ZOFRAN) IV  4 mg Intravenous Q6H   pantoprazole  40 mg Oral Daily   polyethylene glycol  17 g Oral Daily   potassium chloride SA  40 mEq Oral Daily   rosuvastatin  10 mg Oral QHS   sertraline  50 mg Oral Daily    PRN meds: albuterol, HYDROmorphone (DILAUDID) injection, metoprolol tartrate, mouth rinse, oxyCODONE, oxyCODONE-acetaminophen   Infusions:    Antimicrobials: Anti-infectives (From admission, onward)    None       Nutritional status:  Body mass index is 21.67 kg/m.          Objective: Vitals:   05/11/23 0535 05/11/23 0612  BP: (!) 148/126 (!) 119/91  Pulse:    Resp:    Temp:    SpO2:      Intake/Output Summary (Last 24 hours) at 05/11/2023 0958 Last data filed at 05/11/2023 0425 Gross per 24 hour  Intake 120 ml  Output 150 ml  Net -30 ml   Filed Weights   05/06/23 0904  Weight: 60.9 kg   Weight change:  Body mass index is 21.67 kg/m.   Physical Exam: General exam: Pleasant, elderly.  Somnolent Skin: No  rashes, lesions or ulcers. HEENT: Atraumatic, normocephalic, no obvious bleeding Lungs: Clear to auscultation bilaterally CVS: A-fib with RVR, no murmur GI/Abd soft, nontender, nondistended, bowel CNS: Somnolent, opens eyes on verbal, Psychiatry: Sad affect Extremities: No pedal edema, no calf tenderness  Data Review: I have personally reviewed the laboratory data and studies available.  F/u labs ordered Unresulted Labs (From admission, onward)     Start     Ordered   05/08/23 0500  Basic metabolic panel  Daily,   R  Question:  Specimen collection method  Answer:  Lab=Lab collect   05/07/23 1202   05/08/23 0500  CBC with Differential/Platelet  Daily,   R     Question:  Specimen collection method  Answer:  Lab=Lab collect   05/07/23 1202            Total time spent in review of labs and imaging, patient evaluation, formulation of plan, documentation and communication with family: 55 minutes  Signed, Lorin Glass, MD [facility] 05/11/2023

## 2023-05-12 DIAGNOSIS — S32810A Multiple fractures of pelvis with stable disruption of pelvic ring, initial encounter for closed fracture: Secondary | ICD-10-CM | POA: Diagnosis not present

## 2023-05-12 LAB — CBC WITH DIFFERENTIAL/PLATELET
Abs Immature Granulocytes: 0.08 10*3/uL — ABNORMAL HIGH (ref 0.00–0.07)
Basophils Absolute: 0 10*3/uL (ref 0.0–0.1)
Basophils Relative: 0 %
Eosinophils Absolute: 0.1 10*3/uL (ref 0.0–0.5)
Eosinophils Relative: 1 %
HCT: 32.1 % — ABNORMAL LOW (ref 36.0–46.0)
Hemoglobin: 10.4 g/dL — ABNORMAL LOW (ref 12.0–15.0)
Immature Granulocytes: 1 %
Lymphocytes Relative: 12 %
Lymphs Abs: 1.1 10*3/uL (ref 0.7–4.0)
MCH: 31.6 pg (ref 26.0–34.0)
MCHC: 32.4 g/dL (ref 30.0–36.0)
MCV: 97.6 fL (ref 80.0–100.0)
Monocytes Absolute: 1 10*3/uL (ref 0.1–1.0)
Monocytes Relative: 11 %
Neutro Abs: 6.8 10*3/uL (ref 1.7–7.7)
Neutrophils Relative %: 75 %
Platelets: 215 10*3/uL (ref 150–400)
RBC: 3.29 MIL/uL — ABNORMAL LOW (ref 3.87–5.11)
RDW: 13.6 % (ref 11.5–15.5)
WBC: 9 10*3/uL (ref 4.0–10.5)
nRBC: 0.2 % (ref 0.0–0.2)

## 2023-05-12 LAB — BASIC METABOLIC PANEL
Anion gap: 6 (ref 5–15)
BUN: 23 mg/dL (ref 8–23)
CO2: 23 mmol/L (ref 22–32)
Calcium: 8.1 mg/dL — ABNORMAL LOW (ref 8.9–10.3)
Chloride: 101 mmol/L (ref 98–111)
Creatinine, Ser: 0.63 mg/dL (ref 0.44–1.00)
GFR, Estimated: >60 mL/min (ref >60)
Glucose, Bld: 199 mg/dL — ABNORMAL HIGH (ref 70–99)
Potassium: 4.2 mmol/L (ref 3.5–5.1)
Sodium: 130 mmol/L — ABNORMAL LOW (ref 135–145)

## 2023-05-12 LAB — GLUCOSE, CAPILLARY: Glucose-Capillary: 192 mg/dL — ABNORMAL HIGH (ref 70–99)

## 2023-05-12 MED ORDER — AMIODARONE HCL IN DEXTROSE 360-4.14 MG/200ML-% IV SOLN
60.0000 mg/h | INTRAVENOUS | Status: AC
  Administered 2023-05-12 (×2): 60 mg/h via INTRAVENOUS
  Filled 2023-05-12: qty 200

## 2023-05-12 MED ORDER — ONDANSETRON HCL 4 MG/2ML IJ SOLN: 4.0000 mg | Freq: Four times a day (QID) | INTRAMUSCULAR | Status: DC | PRN

## 2023-05-12 MED ORDER — AMIODARONE LOAD VIA INFUSION
150.0000 mg | Freq: Once | INTRAVENOUS | Status: AC
  Administered 2023-05-12: 150 mg via INTRAVENOUS
  Filled 2023-05-12: qty 83.34

## 2023-05-12 MED ORDER — PROSOURCE PLUS PO LIQD
30.0000 mL | Freq: Two times a day (BID) | ORAL | Status: DC
  Administered 2023-05-12 – 2023-05-13 (×2): 30 mL via ORAL
  Filled 2023-05-12 (×3): qty 30

## 2023-05-12 MED ORDER — AMIODARONE HCL IN DEXTROSE 360-4.14 MG/200ML-% IV SOLN
30.0000 mg/h | INTRAVENOUS | Status: DC
  Administered 2023-05-12 – 2023-05-14 (×5): 30 mg/h via INTRAVENOUS
  Filled 2023-05-12 (×6): qty 200

## 2023-05-12 NOTE — Progress Notes (Signed)
PROGRESS NOTE  [name]  DOB: 04-09-1931  PCP: Joya Martyr, MD ZOX:[id number]  DOA: 05/02/2023  LOS: 6 days  Hospital Day: 8  Brief narrative: [name] is a 89 y.o. female with PMH significant for OSA on CPAP, DM2, HTN, HLD, DCM, A-fib on Eliquis, hypothyroidism, chronic anemia, lumbar spinal stenosis, breast cancer s/p lumpectomy, cervical cancer, Barrett's esophagus 6/22, patient was brought to the hospital after a witnessed mechanical fall.  Patient was apparently attempting to stand up and lost balance and fell to the ground   ED, she was in A-fib with RVR and was started on Cardizem drip. Skeletal survey in the ED showed bilateral comminuted mildly displaced fracture of the inferior PRN superior pubic rami extending into the irregularity bilaterally.  There was also noted to be adjacent extraperitoneal blood products in the pelvis.   Eliquis was held Orthopedics recommended nonoperative management Admitted to [facility] for pain management and disposition PT OT recommended home health versus SNF.  Subjective: Patient was seen and examined this morning.   Elderly Caucasian female.  More awake.  Not at conversational yet.  Continues to be in A-fib with RVR despite high-dose of Cardizem Family at bedside.  Assessment and plan: A-fib with RVR Prior history of A-fib that was controlled on Cardizem 120 mg daily and Lopressor 100 mg twice daily at home. On admission, she was in RVR probably due to pain.  Started on Cardizem drip which was gradually weaned off on 6/26. She was resumed on home dose of metoprolol 100 mg twice daily and higher dose of Cardizem at 300 mg daily.  Despite that, patient continues to have elevated heart rate in 120s today.  6/28, I added short acting Cardizem 3 times daily despite which her rate is not controlled.  I started the patient on amiodarone drip today. Cardiology consulted. Anticoagulation with Eliquis to continue  Traumatic pelvic  fracture s/p mechanical fall at home Imaging showed bilateral comminuted mildly displaced fractures of the inferior and superior pubic rami extending into the pubic body bilaterally. Adjacent extraperitoneal blood products in the pelvis Nonoperative management per orthopedics PT/OT eval obtained. Family still wishes to bring patient back home with ongoing caretakers Currently on pain control with Tylenol, scheduled Roxicodone, PRN Robaxin, as needed morphine.   Bowel regimen to continue Outpatient follow-up with Dr. Linna Caprice in 2 to 3 weeks for repeat imaging  Essential hypertension Blood pressure controlled on Cardizem and metoprolol  Type 2 diabetes mellitus A1c 7.3 on 05/06/2023 Not on home meds. Currently on SSI/Accu-Cheks Recent Labs  Lab 05/08/23 2024 05/09/23 0731 05/09/23 1133 05/09/23 1629 05/12/23 1500  GLUCAP 161* 153* 189* 171* 192*   Post-surgical hypothyroidism Continue Synthroid  Chronic anemia GERD Continue Protonix, iron supplement.  On chronic anticoagulation.  No active bleeding Recent Labs    01/13/23 0543 01/14/23 0539 05/08/23 0445 05/09/23 0510 05/10/23 0528 05/11/23 0523 05/12/23 0601  HGB 10.9*   < > 9.0* 10.4* 10.5* 10.5* 10.4*  MCV 102.1*   < > 100.4* 97.9 98.2 97.0 97.6  VITAMINB12 1,335*  --   --   --   --   --   --   FERRITIN 58  --   --   --   --   --   --   TIBC 244*  --   --   --   --   --   --   IRON 52  --   --   --   --   --   --    < > =  values in this interval not displayed.   HLD Crestor to continue   Depression with anxiety Continue Zoloft 50 mg daily   Physical deconditioning Impaired mobility at risk for being bed bound with progressive decline PT OT eval obtained. Family wishing for taking patient home at d/c with ongoing caregivers     Goals of care   Code Status: Full Code     DVT prophylaxis:  apixaban (ELIQUIS) tablet 2.5 mg Start: 05/09/23 1000 SCDs Start: 05/06/23 0735 apixaban (ELIQUIS) tablet 2.5  mg   Antimicrobials: None Fluid: None Consultants: None Family Communication: Family at bedside  Status: Inpatient Level of care:  Telemetry   Patient from: Home Anticipated d/c to: Hopefully home in 1 to 2 days Needs to continue in-hospital care:  Remains in A-fib with RVR.  IV amiodarone drip was started   Diet:  Diet Order             DIET DYS 3 Room service appropriate? Yes; Fluid consistency: Thin  Diet effective now                   Scheduled Meds:  (feeding supplement) PROSource Plus  30 mL Oral BID BM   acetaminophen  1,000 mg Oral TID   apixaban  2.5 mg Oral BID   diltiazem  300 mg Oral Daily   diltiazem  120 mg Oral Once   ferrous sulfate  325 mg Oral Q breakfast   levothyroxine  150 mcg Oral QAC breakfast   LORazepam  0.5 mg Oral QHS   metoprolol tartrate  100 mg Oral BID   oxyCODONE  5 mg Oral Q6H   pantoprazole  40 mg Oral Daily   polyethylene glycol  17 g Oral Daily   potassium chloride SA  40 mEq Oral Daily   rosuvastatin  10 mg Oral QHS   senna-docusate  1 tablet Oral QHS   sertraline  50 mg Oral Daily    PRN meds: albuterol, diltiazem, HYDROmorphone (DILAUDID) injection, methocarbamol, metoprolol tartrate, ondansetron (ZOFRAN) IV, mouth rinse   Infusions:   amiodarone 60 mg/hr (05/12/23 1302)   Followed by   amiodarone      Antimicrobials: Anti-infectives (From admission, onward)    None       Nutritional status:  Body mass index is 21.67 kg/m.          Objective: Vitals:   05/12/23 1231 05/12/23 1240  BP: 129/82 (!) 125/99  Pulse:  (!) 54  Resp:  20  Temp:    SpO2:  92%    Intake/Output Summary (Last 24 hours) at 05/12/2023 1518 Last data filed at 05/12/2023 1240 Gross per 24 hour  Intake 480 ml  Output --  Net 480 ml   Filed Weights   05/06/23 0904  Weight: 60.9 kg   Weight change:  Body mass index is 21.67 kg/m.   Physical Exam: General exam: Pleasant, elderly.  More awake Skin: No rashes, lesions  or ulcers. HEENT: Atraumatic, normocephalic, no obvious bleeding Lungs: Clear to auscultation bilaterally CVS: Continues to be A-fib with RVR, no murmur GI/Abd soft, nontender, nondistended, bowel CNS: More active but still not conversational enough. Psychiatry: Sad affect Extremities: No pedal edema, no calf tenderness  Data Review: I have personally reviewed the laboratory data and studies available.  F/u labs ordered Unresulted Labs (From admission, onward)    None       Total time spent in review of labs and imaging, patient evaluation, formulation of plan, documentation and  communication with family: 55 minutes  Signed, Lorin Glass, MD [facility] 05/12/2023

## 2023-05-13 ENCOUNTER — Inpatient Hospital Stay (HOSPITAL_COMMUNITY): Payer: Medicare PPO

## 2023-05-13 ENCOUNTER — Encounter (HOSPITAL_COMMUNITY): Payer: Self-pay | Admitting: Internal Medicine

## 2023-05-13 ENCOUNTER — Encounter (HOSPITAL_COMMUNITY): Payer: Medicare PPO

## 2023-05-13 DIAGNOSIS — J9601 Acute respiratory failure with hypoxia: Secondary | ICD-10-CM

## 2023-05-13 DIAGNOSIS — I4891 Unspecified atrial fibrillation: Secondary | ICD-10-CM | POA: Diagnosis not present

## 2023-05-13 DIAGNOSIS — S32810A Multiple fractures of pelvis with stable disruption of pelvic ring, initial encounter for closed fracture: Secondary | ICD-10-CM | POA: Diagnosis not present

## 2023-05-13 LAB — CBC WITH DIFFERENTIAL/PLATELET
Abs Immature Granulocytes: 0.35 10*3/uL — ABNORMAL HIGH (ref 0.00–0.07)
Basophils Absolute: 0.1 10*3/uL (ref 0.0–0.1)
Basophils Relative: 0 %
Eosinophils Absolute: 0 10*3/uL (ref 0.0–0.5)
Eosinophils Relative: 0 %
HCT: 34.5 % — ABNORMAL LOW (ref 36.0–46.0)
Hemoglobin: 11 g/dL — ABNORMAL LOW (ref 12.0–15.0)
Immature Granulocytes: 2 %
Lymphocytes Relative: 3 %
Lymphs Abs: 0.4 10*3/uL — ABNORMAL LOW (ref 0.7–4.0)
MCH: 32.4 pg (ref 26.0–34.0)
MCHC: 31.9 g/dL (ref 30.0–36.0)
MCV: 101.5 fL — ABNORMAL HIGH (ref 80.0–100.0)
Monocytes Absolute: 1.2 10*3/uL — ABNORMAL HIGH (ref 0.1–1.0)
Monocytes Relative: 7 %
Neutro Abs: 15 10*3/uL — ABNORMAL HIGH (ref 1.7–7.7)
Neutrophils Relative %: 88 %
Platelets: 244 10*3/uL (ref 150–400)
RBC: 3.4 MIL/uL — ABNORMAL LOW (ref 3.87–5.11)
RDW: 13.9 % (ref 11.5–15.5)
WBC: 17.1 10*3/uL — ABNORMAL HIGH (ref 4.0–10.5)
nRBC: 0.2 % (ref 0.0–0.2)

## 2023-05-13 LAB — BASIC METABOLIC PANEL
Anion gap: 8 (ref 5–15)
BUN: 36 mg/dL — ABNORMAL HIGH (ref 8–23)
CO2: 21 mmol/L — ABNORMAL LOW (ref 22–32)
Calcium: 7.8 mg/dL — ABNORMAL LOW (ref 8.9–10.3)
Chloride: 103 mmol/L (ref 98–111)
Creatinine, Ser: 0.93 mg/dL (ref 0.44–1.00)
GFR, Estimated: 58 mL/min — ABNORMAL LOW (ref >60)
Glucose, Bld: 374 mg/dL — ABNORMAL HIGH (ref 70–99)
Potassium: 5.5 mmol/L — ABNORMAL HIGH (ref 3.5–5.1)
Sodium: 132 mmol/L — ABNORMAL LOW (ref 135–145)

## 2023-05-13 LAB — BLOOD GAS, ARTERIAL
Acid-base deficit: 5.8 mmol/L — ABNORMAL HIGH (ref 0.0–2.0)
Bicarbonate: 20.1 mmol/L (ref 20.0–28.0)
Drawn by: 270211
FIO2: 100 %
MECHVT: 470 mL
O2 Saturation: 97.6 %
PEEP: 5 cmH2O
Patient temperature: 32.8
RATE: 24 resp/min
pCO2 arterial: 33 mmHg (ref 32–48)
pH, Arterial: 7.37 (ref 7.35–7.45)
pO2, Arterial: 67 mmHg — ABNORMAL LOW (ref 83–108)

## 2023-05-13 LAB — GLUCOSE, CAPILLARY
Glucose-Capillary: 299 mg/dL — ABNORMAL HIGH (ref 70–99)
Glucose-Capillary: 361 mg/dL — ABNORMAL HIGH (ref 70–99)
Glucose-Capillary: 329 mg/dL — ABNORMAL HIGH (ref 70–99)
Glucose-Capillary: 324 mg/dL — ABNORMAL HIGH (ref 70–99)

## 2023-05-13 LAB — ECHOCARDIOGRAM COMPLETE
Calc EF: 55.1 %
Height: 66 in
S' Lateral: 2.8 cm
Single Plane A2C EF: 54.7 %
Single Plane A4C EF: 57 %
Weight: 2148.16 oz

## 2023-05-13 LAB — PROCALCITONIN: Procalcitonin: 0.26 ng/mL

## 2023-05-13 LAB — LACTIC ACID, PLASMA: Lactic Acid, Venous: 3.1 mmol/L (ref 0.5–1.9)

## 2023-05-13 MED ORDER — SODIUM CHLORIDE 0.9 % IV SOLN
2.0000 g | INTRAVENOUS | Status: DC
  Administered 2023-05-13: 2 g via INTRAVENOUS
  Filled 2023-05-13: qty 20

## 2023-05-13 MED ORDER — INSULIN ASPART 100 UNIT/ML IJ SOLN
0.0000 [IU] | INTRAMUSCULAR | Status: DC
  Administered 2023-05-13: 9 [IU] via SUBCUTANEOUS
  Administered 2023-05-13: 7 [IU] via SUBCUTANEOUS
  Administered 2023-05-14 (×2): 3 [IU] via SUBCUTANEOUS
  Administered 2023-05-14: 5 [IU] via SUBCUTANEOUS
  Administered 2023-05-14: 3 [IU] via SUBCUTANEOUS

## 2023-05-13 MED ORDER — ORAL CARE MOUTH RINSE
15.0000 mL | OROMUCOSAL | Status: DC
  Administered 2023-05-13 – 2023-05-14 (×7): 15 mL via OROMUCOSAL

## 2023-05-13 MED ORDER — HYDROMORPHONE BOLUS VIA INFUSION
0.2500 mg | INTRAVENOUS | Status: DC | PRN
  Administered 2023-05-14: 0.5 mg via INTRAVENOUS

## 2023-05-13 MED ORDER — NOREPINEPHRINE 4 MG/250ML-% IV SOLN
0.0000 ug/min | INTRAVENOUS | Status: DC
  Administered 2023-05-13: 35 ug/min via INTRAVENOUS
  Administered 2023-05-13: 12 ug/min via INTRAVENOUS
  Administered 2023-05-13: 30 ug/min via INTRAVENOUS
  Administered 2023-05-14: 45 ug/min via INTRAVENOUS
  Administered 2023-05-14: 35 ug/min via INTRAVENOUS
  Administered 2023-05-14: 36 ug/min via INTRAVENOUS
  Administered 2023-05-14: 47 ug/min via INTRAVENOUS
  Administered 2023-05-14: 32 ug/min via INTRAVENOUS
  Administered 2023-05-14: 35 ug/min via INTRAVENOUS
  Filled 2023-05-13: qty 250
  Filled 2023-05-13: qty 500
  Filled 2023-05-13 (×6): qty 250

## 2023-05-13 MED ORDER — ORAL CARE MOUTH RINSE: 15.0000 mL | OROMUCOSAL | Status: DC | PRN

## 2023-05-13 MED ORDER — SERTRALINE HCL 100 MG PO TABS: 50.0000 mg | ORAL_TABLET | Freq: Every day | ORAL | Status: DC

## 2023-05-13 MED ORDER — SUCCINYLCHOLINE CHLORIDE 200 MG/10ML IV SOSY
PREFILLED_SYRINGE | INTRAVENOUS | Status: AC
  Filled 2023-05-13: qty 10

## 2023-05-13 MED ORDER — ROCURONIUM BROMIDE 10 MG/ML (PF) SYRINGE
PREFILLED_SYRINGE | INTRAVENOUS | Status: AC
  Administered 2023-05-13: 50 mg
  Filled 2023-05-13: qty 10

## 2023-05-13 MED ORDER — DEXMEDETOMIDINE HCL IN NACL 200 MCG/50ML IV SOLN
0.0000 ug/kg/h | INTRAVENOUS | Status: DC
  Administered 2023-05-13: 0.4 ug/kg/h via INTRAVENOUS
  Administered 2023-05-13: 0.2 ug/kg/h via INTRAVENOUS
  Administered 2023-05-14 (×2): 1.2 ug/kg/h via INTRAVENOUS
  Filled 2023-05-13 (×4): qty 50

## 2023-05-13 MED ORDER — DILTIAZEM HCL ER COATED BEADS 180 MG PO CP24
360.0000 mg | ORAL_CAPSULE | Freq: Every day | ORAL | Status: DC
  Administered 2023-05-13: 360 mg via ORAL
  Filled 2023-05-13: qty 2

## 2023-05-13 MED ORDER — SODIUM CHLORIDE 0.9% FLUSH: 10.0000 mL | INTRAVENOUS | Status: DC | PRN

## 2023-05-13 MED ORDER — FUROSEMIDE 10 MG/ML IJ SOLN: 20.0000 mg | Freq: Once | INTRAMUSCULAR | Status: DC

## 2023-05-13 MED ORDER — CHLORHEXIDINE GLUCONATE CLOTH 2 % EX PADS
6.0000 | MEDICATED_PAD | Freq: Every day | CUTANEOUS | Status: DC
  Administered 2023-05-14: 6 via TOPICAL

## 2023-05-13 MED ORDER — SODIUM BICARBONATE 8.4 % IV SOLN
INTRAVENOUS | Status: AC
  Filled 2023-05-13: qty 50

## 2023-05-13 MED ORDER — NOREPINEPHRINE 4 MG/250ML-% IV SOLN
INTRAVENOUS | Status: AC
  Administered 2023-05-13: 10 ug/min via INTRAVENOUS
  Filled 2023-05-13: qty 250

## 2023-05-13 MED ORDER — NALOXONE HCL 0.4 MG/ML IJ SOLN
INTRAMUSCULAR | Status: AC
  Filled 2023-05-13: qty 1

## 2023-05-13 MED ORDER — ETOMIDATE 2 MG/ML IV SOLN
INTRAVENOUS | Status: AC
  Administered 2023-05-13: 20 mg
  Filled 2023-05-13: qty 20

## 2023-05-13 MED ORDER — LEVOTHYROXINE SODIUM 75 MCG PO TABS: 150.0000 ug | ORAL_TABLET | Freq: Every day | ORAL | Status: DC

## 2023-05-13 MED ORDER — HYDROMORPHONE HCL 1 MG/ML IJ SOLN
0.5000 mg | Freq: Once | INTRAMUSCULAR | Status: AC
  Administered 2023-05-13: 0.5 mg via INTRAVENOUS

## 2023-05-13 MED ORDER — FENTANYL CITRATE (PF) 100 MCG/2ML IJ SOLN
INTRAMUSCULAR | Status: AC
  Filled 2023-05-13: qty 2

## 2023-05-13 MED ORDER — SODIUM CHLORIDE 0.9% FLUSH
10.0000 mL | Freq: Two times a day (BID) | INTRAVENOUS | Status: DC
  Administered 2023-05-13 – 2023-05-14 (×2): 10 mL

## 2023-05-13 MED ORDER — NOREPINEPHRINE 4 MG/250ML-% IV SOLN: 2.0000 ug/min | INTRAVENOUS | Status: DC

## 2023-05-13 MED ORDER — SODIUM CHLORIDE 0.9 % IV SOLN
250.0000 mL | INTRAVENOUS | Status: DC
  Administered 2023-05-13: 250 mL via INTRAVENOUS

## 2023-05-13 MED ORDER — HYDROMORPHONE HCL-NACL 50-0.9 MG/50ML-% IV SOLN
0.5000 mg/h | INTRAVENOUS | Status: DC
  Administered 2023-05-13: 0.5 mg/h via INTRAVENOUS
  Administered 2023-05-14: 1 mg/h via INTRAVENOUS
  Filled 2023-05-13 (×2): qty 50

## 2023-05-13 MED ORDER — FENTANYL CITRATE PF 50 MCG/ML IJ SOSY
PREFILLED_SYRINGE | INTRAMUSCULAR | Status: AC
  Filled 2023-05-13: qty 2

## 2023-05-13 MED ORDER — MIDAZOLAM HCL 2 MG/2ML IJ SOLN
INTRAMUSCULAR | Status: AC
  Filled 2023-05-13: qty 2

## 2023-05-13 MED ORDER — ENOXAPARIN SODIUM 60 MG/0.6ML IJ SOSY
1.0000 mg/kg | PREFILLED_SYRINGE | Freq: Two times a day (BID) | INTRAMUSCULAR | Status: DC
  Administered 2023-05-13 – 2023-05-14 (×2): 60 mg via SUBCUTANEOUS
  Filled 2023-05-13 (×2): qty 0.6

## 2023-05-13 MED ORDER — NOREPINEPHRINE 4 MG/250ML-% IV SOLN: 0.0000 ug/min | INTRAVENOUS | Status: DC

## 2023-05-13 MED ORDER — VASOPRESSIN 20 UNITS/100 ML INFUSION FOR SHOCK
0.0000 [IU]/min | INTRAVENOUS | Status: DC
  Administered 2023-05-13 – 2023-05-14 (×3): 0.03 [IU]/min via INTRAVENOUS
  Filled 2023-05-13 (×3): qty 100

## 2023-05-13 MED ORDER — ROSUVASTATIN CALCIUM 10 MG PO TABS: 10.0000 mg | ORAL_TABLET | Freq: Every day | ORAL | Status: DC

## 2023-05-13 MED ORDER — PHENYLEPHRINE 80 MCG/ML (10ML) SYRINGE FOR IV PUSH (FOR BLOOD PRESSURE SUPPORT)
PREFILLED_SYRINGE | INTRAVENOUS | Status: AC
  Administered 2023-05-13: 400 ug
  Filled 2023-05-13: qty 10

## 2023-05-13 MED ORDER — ACETAMINOPHEN 650 MG RE SUPP
650.0000 mg | Freq: Four times a day (QID) | RECTAL | Status: DC | PRN
  Administered 2023-05-13: 650 mg via RECTAL
  Filled 2023-05-13 (×2): qty 1

## 2023-05-13 NOTE — Procedures (Signed)
Central Venous Catheter Insertion Procedure Note  [name]  [id number]  03-Jul-1931  Date:05/13/23  Time:3:51 PM   Provider Performing:Katrina Daddona Erby Pian   Procedure: Insertion of Non-tunneled Central Venous [phone]) with US guidance ([id number])   Indication(s) Difficult access  Consent Verbal from family at bedside  Anesthesia Topical only with 1% lidocaine   Timeout Verified patient identification, verified procedure, site/side was marked, verified correct patient position, special equipment/implants available, medications/allergies/relevant history reviewed, required imaging and test results available.  Sterile Technique Maximal sterile technique including full sterile barrier drape, hand hygiene, sterile gown, sterile gloves, mask, hair covering, sterile ultrasound probe cover (if used).  Procedure Description Area of catheter insertion was cleaned with chlorhexidine and draped in sterile fashion.  With real-time ultrasound guidance a central venous catheter was placed into the left internal jugular vein. Nonpulsatile blood flow and easy flushing noted in all ports.  The catheter was sutured in place and sterile dressing applied.  Complications/Tolerance None; patient tolerated the procedure well. Chest X-ray is ordered to verify placement for internal jugular or subclavian cannulation.   Chest x-ray is not ordered for femoral cannulation.  EBL Minimal  Specimen(s) None

## 2023-05-13 NOTE — Progress Notes (Signed)
eLink Physician-Brief Progress Note Patient Name: [name] DOB: February 07, 1931 MRN: [id number]   Date of Service  05/13/2023  HPI/Events of Note  Patient running temp 101.5, only has PO Tylenol and is NPO. Resp culture done this admit. Already on ceftriaxone for pneumonia. Needs PR or IV tylenol.  eICU Interventions  Tylenol switched to suppository prn for fever     Intervention Category Intermediate Interventions: Infection - evaluation and management Minor Interventions: Routine modifications to care plan (e.g. PRN medications for pain, fever)  [name] 05/13/2023, 11:00 PM

## 2023-05-13 NOTE — Progress Notes (Signed)
When rounding on the patient noted that patient was pale and skin was  clammy and diaphoretic. NT at beside unable to to get her temp and oxygen saturation. Patient able to respond to voice and state her name however drowsy and goes back to sleep.  RR and MD informed and shortly at bedside. Pt was placed on non-rebreather and rectal temp was obtained ( see flowsheet). New orders are placed and carried out.Pt was transferred to ICU, caregiver at bedside with pt's belonging.

## 2023-05-13 NOTE — IPAL (Signed)
  Interdisciplinary Goals of Care Family Meeting   Date carried out: 05/13/2023  Location of the meeting: Conference room  Member's involved: Physician, Bedside Registered Nurse, and Family Member or next of kin  Durable Power of Attorney or acting medical decision maker: Kris Hartmann    Discussion: We discussed goals of care for [name] .  Patient was fairly functional until this fall and pelvic fracture.  There was some discussion of her eating less and being on a steroid taper for reason I am not quite sure about.  I started to discuss what to do if the patient does not turn around on the ventilator.  Discussed that we may need to start thinking about end-of-life and what [name] would want.  The daughters and grandchildren will discuss this.  I told him I would have palliative care reach out to them as well.  Code status:   Code Status: Full Code   Disposition: Continue current acute care  Time spent for the meeting: 10 mins    Lorin Glass, MD  05/13/2023, 5:21 PM

## 2023-05-13 NOTE — Progress Notes (Signed)
   05/13/23 0823  Assess: MEWS Score  Temp (!) 97.5 F (36.4 C)  BP (!) 135/92  Pulse Rate (!) 112  Resp 20  SpO2 92 %  O2 Device Room Air  Assess: MEWS Score  MEWS Temp 0  MEWS Systolic 0  MEWS Pulse 2  MEWS RR 0  MEWS LOC 0  MEWS Score 2  MEWS Score Color Yellow  Assess: if the MEWS score is Yellow or Red  Were vital signs taken at a resting state? Yes  Focused Assessment No change from prior assessment  Does the patient meet 2 or more of the SIRS criteria? Yes  Does the patient have a confirmed or suspected source of infection? No  MEWS guidelines implemented  No, previously yellow, continue vital signs every 4 hours  Assess: SIRS CRITERIA  SIRS Temperature  0  SIRS Pulse 1  SIRS Respirations  0  SIRS WBC 0  SIRS Score Sum  1

## 2023-05-13 NOTE — Consult Note (Signed)
CARDIOLOGY CONSULT NOTE  Patient ID: SAIJE SOCARRAS MRN: [id number] DOB/AGE: [birth year] 89 y.o.  Admit date: 04/23/2023 Primary Physician Joya Martyr, MD  Primary Cardiologist Tonny Bollman, MD  Chief Complaint  Atrial fib, RVR Requesting  Dr Pola Corn  HPI: 89 yo female w/ hx persistent atrial fib, failed Tikosyn and amio >> rate control. Also hx OSA on CPAP, DM2, HTN, HLD, DCM, hypothyroidism, chronic anemia, lumbar spinal stenosis, breast cancer s/p lumpectomy, cervical cancer, Barrett's esophagus.  She was admitted 06/22 after a mechanical fall. She was in Afib, RVR and started on Cardizem drip.   Eliquis was held because of bilateral comminuted mildly displaced fracture of the inferior PRN superior pubic rami extending into the irregularity bilaterally.  There was also noted to be adjacent extraperitoneal blood products in the pelvis. Ortho saw and recommended nonoperative management.     Cardizem gtt was gradually weaned off and her home metoprolol 100 mg bid was resumed. Oral Cardizem was increased from 240 >> 300 mg every day.    Hs R have remained elevated, Cards asked to see.      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   Cervical cancer (HCC)    COLONIC POLYPS, ADENOMATOUS, HX OF 10/13/2010   Qualifier: Diagnosis of  By: Koleen Distance CMA (AAMA), Leisha     Dilated cardiomyopathy (HCC) 08/27/2018   DJD (degenerative joint disease) of knee    EROSIVE ESOPHAGITIS 10/13/2010   Qualifier: Diagnosis of  By: Koleen Distance CMA (AAMA), Hulan Saas     Essential hypertension 10/13/2010   Qualifier: Diagnosis of  By: Koleen Distance CMA (AAMA), Baird Cancer, intermittent 07/09/2012   GERD  (gastroesophageal reflux disease)    Glaucoma    Hiatal hernia s/p robotic repair & fundoplication 05/25/2017 10/13/2010   Qualifier: Diagnosis of  By: Koleen Distance CMA (AAMA), Hulan Saas     History of laser assisted in situ keratomileusis 07/25/2012   HYPERCHOLESTEROLEMIA 10/13/2010   Qualifier: Diagnosis of  By: Koleen Distance CMA (AAMA), Leisha     Hypothyroidism, postsurgical    h/o thyroid cancer s/p thyroidectomy   Iron Deficiency Anemia 10/14/2010   Qualifier: Diagnosis of  By: Jarold Motto MD Mervyn Skeeters R    Irritable bowel syndrome 10/13/2010   Qualifier: Diagnosis of  By: Koleen Distance CMA (AAMA), Leisha     Lumbar stenosis with neurogenic claudication 04/20/2016   OSA on CPAP    settings at 10-11    Type II diabetes mellitus (HCC)    type II     Past Surgical History:  Procedure Laterality Date   ABDOMINAL HYSTERECTOMY     "partial"   BIOPSY  01/15/2023   Procedure: BIOPSY;  Surgeon: Hilarie Fredrickson, MD;  Location: [facility] ENDOSCOPY;  Service: Gastroenterology;;   BREAST BIOPSY Right    BREAST LUMPECTOMY Right    CARDIOVERSION N/A 03/30/2014   Procedure: CARDIOVERSION - BEDSIDE;  Surgeon: Wendall Stade, MD;  Location: [facility] OR;  Service: Cardiovascular;  Laterality: N/A;   CARDIOVERSION N/A 03/31/2014   Procedure: CARDIOVERSION  (BEDSIDE) ;  Surgeon: Lewayne Bunting, MD;  Location: [facility] OR;  Service: Cardiovascular;  Laterality: N/A;   CARDIOVERSION N/A 04/27/2014  Procedure: CARDIOVERSION;  Surgeon: Cassell Clement, MD;  Location: [facility] ENDOSCOPY;  Service: Cardiovascular;  Laterality: N/A;   CARDIOVERSION N/A 04/13/2015   Procedure: CARDIOVERSION;  Surgeon: Vesta Mixer, MD;  Location: [facility] ENDOSCOPY;  Service: Cardiovascular;  Laterality: N/A;   CARDIOVERSION N/A 01/08/2017   Procedure: CARDIOVERSION;  Surgeon: Thurmon Fair, MD;  Location: [facility] ENDOSCOPY;  Service: Cardiovascular;  Laterality: N/A;   CARDIOVERSION N/A 09/19/2017   Procedure: CARDIOVERSION;  Surgeon: Wendall Stade, MD;  Location: [facility]  ENDOSCOPY;  Service: Cardiovascular;  Laterality: N/A;   CARDIOVERSION N/A 10/25/2017   Procedure: CARDIOVERSION;  Surgeon: Nahser, Deloris Ping, MD;  Location: [facility] ENDOSCOPY;  Service: Cardiovascular;  Laterality: N/A;   CARDIOVERSION N/A 12/27/2018   Procedure: CARDIOVERSION;  Surgeon: Parke Poisson, MD;  Location: [facility] ENDOSCOPY;  Service: Cardiovascular;  Laterality: N/A;   CARDIOVERSION N/A 05/20/2019   Procedure: CARDIOVERSION;  Surgeon: Parke Poisson, MD;  Location: [facility] ENDOSCOPY;  Service: Cardiovascular;  Laterality: N/A;   CARDIOVERSION N/A 12/10/2019   Procedure: CARDIOVERSION;  Surgeon: Chrystie Nose, MD;  Location: [facility] ENDOSCOPY;  Service: Cardiovascular;  Laterality: N/A;   CARDIOVERSION N/A 12/31/2019   Procedure: CARDIOVERSION;  Surgeon: Thurmon Fair, MD;  Location: [facility] ENDOSCOPY;  Service: Cardiovascular;  Laterality: N/A;   CARDIOVERSION N/A 10/21/2020   Procedure: CARDIOVERSION;  Surgeon: Parke Poisson, MD;  Location: [facility] ENDOSCOPY;  Service: Cardiovascular;  Laterality: N/A;   CARDIOVERSION N/A 12/03/2020   Procedure: CARDIOVERSION;  Surgeon: Jake Bathe, MD;  Location: [facility] ENDOSCOPY;  Service: Cardiovascular;  Laterality: N/A;   CARDIOVERSION N/A 07/11/2021   Procedure: CARDIOVERSION;  Surgeon: Chrystie Nose, MD;  Location: [facility] ENDOSCOPY;  Service: Cardiovascular;  Laterality: N/A;   CARPAL TUNNEL RELEASE Right    CATARACT EXTRACTION W/ INTRAOCULAR LENS  IMPLANT, BILATERAL Bilateral    COLONOSCOPY     COLONOSCOPY N/A 01/15/2023   Procedure: COLONOSCOPY;  Surgeon: Hilarie Fredrickson, MD;  Location: [facility] ENDOSCOPY;  Service: Gastroenterology;  Laterality: N/A;   EXCISIONAL HEMORRHOIDECTOMY     INSERTION OF MESH N/A 05/25/2017   Procedure: INSERTION OF MESH;  Surgeon: Axel Filler, MD;  Location: [facility] ORS;  Service: General;  Laterality: N/A;   TEE WITHOUT CARDIOVERSION N/A 03/30/2014   Procedure: TRANSESOPHAGEAL ECHOCARDIOGRAM (TEE);  Surgeon: Wendall Stade, MD;  Location:  [facility] ENDOSCOPY;  Service: Cardiovascular;  Laterality: N/A;   THYROIDECTOMY     TONSILLECTOMY      Allergies  Allergen Reactions   Penicillins Itching, Other (See Comments) and Rash    Did it involve swelling of the face/tongue/throat, SOB, or low BP? No  Did it involve sudden or severe rash/hives, skin peeling, or any reaction on the inside of your mouth or nose? No  Did you need to seek medical attention at a hospital or doctor's office? No  When did it last happen?    30+ years    If all above answers are "NO", may proceed with cephalosporin use.   Medications Prior to Admission  Medication Sig Dispense Refill Last Dose   acetaminophen (TYLENOL) 650 MG CR tablet Take 650 mg by mouth in the morning and at bedtime.   05/04/2023   apixaban (ELIQUIS) 5 MG TABS tablet Take 1 tablet (5 mg total) by mouth 2 (two) times daily. 60 tablet 1 04/15/2023 at 1800   cholecalciferol (VITAMIN D) 25 MCG (1000 UNIT) tablet Take 1,000 Units by mouth daily.   04/30/2023   diltiazem (CARDIZEM CD) 120 MG 24 hr capsule Take 1 capsule (  120 mg total) by mouth daily. 90 capsule 3 04/15/2023   ferrous sulfate 325 (65 FE) MG tablet Take 325 mg by mouth daily with breakfast.   04/19/2023   lactase (LACTAID) 3000 units tablet Take 2 tablets (6,000 Units total) by mouth 3 (three) times daily with meals. (Patient taking differently: Take 6,000 Units by mouth 3 (three) times daily with meals. As needed.) 180 tablet 1 05/03/2023   levothyroxine (SYNTHROID) 150 MCG tablet Take 150 mcg by mouth every morning.   05/04/2023   LORazepam (ATIVAN) 1 MG tablet Take 0.5 mg by mouth at bedtime. Take additional 0.5 mg if needed   05/03/2023   metoprolol tartrate (LOPRESSOR) 100 MG tablet TAKE 1 TABLET(100 MG) BY MOUTH TWICE DAILY (Patient taking differently: Take 100 mg by mouth 2 (two) times daily.) 180 tablet 3 05/11/2023 at 1800   pantoprazole (PROTONIX) 40 MG tablet Take 1 tablet (40 mg total) by mouth daily. 30 tablet 11 05/02/2023    Polyvinyl Alcohol-Povidone PF 1.4-0.6 % SOLN Place 1 drop into both eyes daily as needed (dry eyes).    Unknown   potassium chloride (KLOR-CON) 10 MEQ tablet Take 10 mEq by mouth daily.   04/19/2023   rosuvastatin (CRESTOR) 10 MG tablet Take 10 mg by mouth at bedtime.   04/18/2023   saccharomyces boulardii (FLORASTOR) 250 MG capsule Take 1 capsule (250 mg total) by mouth 2 (two) times daily.   05/10/2023   sertraline (ZOLOFT) 50 MG tablet Take 50 mg by mouth in the morning.   04/15/2023   vitamin B-12 (CYANOCOBALAMIN) 500 MCG tablet Take 500 mcg by mouth daily.   05/06/2023   zinc gluconate 50 MG tablet Take 50 mg by mouth daily.   05/01/2023   ACCU-CHEK GUIDE test strip       metFORMIN (GLUCOPHAGE-XR) 500 MG 24 hr tablet Take 500 mg by mouth 2 (two) times daily. (Patient not taking: Reported on 05/06/2023)   Not Taking   potassium chloride SA (KLOR-CON M) 20 MEQ tablet Take 2 tablets (40 mEq total) by mouth daily. (Patient not taking: Reported on 05/06/2023) 90 tablet 3 Not Taking   Family History  Problem Relation Age of Onset   Prostate cancer Father    Diabetes Sister    Heart disease Sister    Heart attack Brother    Heart disease Brother    Diabetes Brother    Diabetes Sister    Diabetes Sister    Pancreatic cancer Sister    Diabetes Brother    Heart disease Brother    Diabetes Brother    Heart disease Brother    Diabetes Brother    Heart disease Brother    Diabetes Brother    Heart disease Brother    Diabetes Brother    Heart disease Brother    Diabetes Brother    Diabetes Child     Social History   Socioeconomic History   Marital status: Divorced    Spouse name: Not on file   Number of children: 2   Years of education: Not on file   Highest education level: Not on file  Occupational History   Occupation: Retired    Comment: Engineer, site  Tobacco Use   Smoking status: Never   Smokeless tobacco: Never  Vaping Use   Vaping Use: Never used  Substance and Sexual  Activity   Alcohol use: No    Alcohol/week: 0.0 standard drinks of alcohol   Drug use: No   Sexual activity: Never  Other Topics Concern   Not on file  Social History Narrative   Divorced, daughter Kris Hartmann is primary family member   Patient has private duty home care/assistance   No alcohol tobacco or drug use   Social Determinants of Health   Financial Resource Strain: Not on file  Food Insecurity: No Food Insecurity (05/06/2023)   Hunger Vital Sign    Worried About Running Out of Food in the Last Year: Never true    Ran Out of Food in the Last Year: Never true  Transportation Needs: No Transportation Needs (05/06/2023)   PRAPARE - Administrator, Civil Service (Medical): No    Lack of Transportation (Non-Medical): No  Physical Activity: Not on file  Stress: Not on file  Social Connections: Not on file  Intimate Partner Violence: Not At Risk (05/06/2023)   Humiliation, Afraid, Rape, and Kick questionnaire    Fear of Current or Ex-Partner: No    Emotionally Abused: No    Physically Abused: No    Sexually Abused: No     ROS:    As stated in the HPI and negative for all other systems.  Physical Exam: Blood pressure (!) 124/91, pulse (!) 108, temperature 98 F (36.7 C), resp. rate (!) 22, height 5\' 6"  (1.676 m), weight 60.9 kg, SpO2 90 %.  See below  Labs: Lab Results  Component Value Date   BUN 23 05/12/2023   Lab Results  Component Value Date   CREATININE 0.63 05/12/2023   Lab Results  Component Value Date   NA 130 (L) 05/12/2023   K 4.2 05/12/2023   CL 101 05/12/2023   CO2 23 05/12/2023   Lab Results  Component Value Date   TROPONINI <0.30 03/29/2014   Lab Results  Component Value Date   WBC 9.0 05/12/2023   HGB 10.4 (L) 05/12/2023   HCT 32.1 (L) 05/12/2023   MCV 97.6 05/12/2023   PLT 215 05/12/2023   No results found for: "CHOL", "HDL", "LDLCALC", "LDLDIRECT", "TRIG", "CHOLHDL" Lab Results  Component Value Date   ALT 35 (H)  02/21/2023   AST 17 02/21/2023   ALKPHOS 133 (H) 02/21/2023   BILITOT 0.5 02/21/2023      Radiology:   CXR: No results found. ECHO: ordered ECHO w/ bubble study: 01/22/2023  1. Left ventricular ejection fraction, by estimation, is 65 to 70%. The left ventricle has normal function. The left ventricle has no regional wall motion abnormalities. There is moderate concentric left ventricular  hypertrophy.   2. Right ventricular systolic function is moderately reduced. The right ventricular size is mildly enlarged. There is mildly elevated pulmonary artery systolic pressure.   3. Left atrial size was moderately dilated.   4. Right atrial size was mild to moderately dilated.   5. The mitral valve is degenerative. Trivial mitral valve regurgitation. No evidence of mitral stenosis. Moderate mitral annular calcification.   6. The aortic valve is tricuspid. Aortic valve regurgitation is mild. No aortic stenosis is present. Aortic regurgitation PHT measures 425 msec.   7. The inferior vena cava is dilated in size with >50% respiratory variability, suggesting right atrial pressure of 8 mmHg.   8. Agitated saline contrast bubble study was negative, with no evidence of any interatrial shunt.    EKG: Atrial fib, RVR, HR 130  Signed: Rhonda Barrett 05/13/2023, 7:33 AM   History and all data above reviewed.  Patient examined.  I agree with the findings as above.   The patient had a  mechanical fall.  She has chronic atrial fib.    She had a witnessed mechanical fall.  She lives at home and she has 24/7 help at home.  She gets herself up with some assist and uses a walker.  Her sitters report that her BP and HR fluctuate at home.  They cannot give me a range.  The patient wears O2 at night.  She slips on two pillows.  She is reports that she feels her heart beating at night.  She does not report chest pain.  She currently is reporting abdominal pain.     The patient exam reveals  GENERAL:  Well  appearing HEENT:  Pupils equal round and reactive, fundi not visualized, oral mucosa unremarkable NECK:  No jugular venous distention, waveform within normal limits, carotid upstroke brisk and symmetric, no bruits, no thyromegaly LYMPHATICS:  No cervical, inguinal adenopathy LUNGS:  Clear to auscultation bilaterally BACK:  No CVA tenderness CHEST:  Unremarkable HEART:  PMI not displaced or sustained,S1 and S2 within normal limits, no S3, no clicks, no rubs, no murmurs, irregular  ABD:  Flat, positive bowel sounds normal in frequency in pitch, no bruits, no rebound, no guarding, no midline pulsatile mass, no hepatomegaly, no splenomegaly EXT:  2 plus pulses throughout, no edema, no cyanosis no clubbing SKIN:  No rashes no nodules NEURO:  Cranial nerves II through XII grossly intact, motor grossly intact throughout PSYCH:  Cognitively intact, oriented to person place and time   All available labs, radiology testing, previous records reviewed. Agree with documented assessment and plan. Atrial fib:  She is on higher dose Cardizem CD and metoprolol.     I will increase the PO Cardizem to 360 mg and continue metoprolol. Continue IV amiodarone today and when she is ready for discharge we can change to PO amiodarone 400 mg bid with a plan for a taper.  Continue Eliquis.  Rate control will be difficult with the pain that apparently is somewhat difficult to control.   Rollene Rotunda  7:49 AM  05/13/2023

## 2023-05-13 NOTE — Progress Notes (Signed)
PROGRESS NOTE  [name]  DOB: [birth year]  PCP: Joya Martyr, MD ZOX:[id number]  DOA: 04/28/2023  LOS: 7 days  Hospital Day: 9  Brief narrative: [name] is a 89 y.o. female with PMH significant for OSA on CPAP, DM2, HTN, HLD, DCM, A-fib on Eliquis, hypothyroidism, chronic anemia, lumbar spinal stenosis, breast cancer s/p lumpectomy, cervical cancer, Barrett's esophagus 6/22, patient was brought to the hospital after a witnessed mechanical fall.  Patient was apparently attempting to stand up and lost balance and fell to the ground   ED, she was in A-fib with RVR and was started on Cardizem drip. Skeletal survey in the ED showed bilateral comminuted mildly displaced fracture of the inferior PRN superior pubic rami extending into the irregularity bilaterally.  There was also noted to be adjacent extraperitoneal blood products in the pelvis.   Eliquis was held Orthopedics recommended nonoperative management Admitted to [facility] for pain management and disposition PT OT recommended home health versus SNF.  Subjective: Patient was seen and examined this morning.   Lying on bed.  Not in distress.  Opens eyes on verbal command.  Able to answer simple questions.  Looks tired and weak.  A-fib now controlled with amiodarone drip. Caregiver at bedside.  Assessment and plan: A-fib with RVR Prior history of A-fib that was controlled on Cardizem 120 mg daily and Lopressor 100 mg twice daily at home. On admission, she was in RVR probably due to pain.  Initially started on Cardizem drip and later weaned off.  Home medicines were resumed.  Patient flipped back to A-fib.  Cardizem was increased up to 300 mg daily despite which A-fib continued.  6/29, amiodarone drip was started after which her rate is better controlled.   Cardiology consult appreciated.  Increased Cardizem to 360 mg daily.  Metoprolol 100 mg twice daily to continue.   Anticoagulation with Eliquis to  continue  Traumatic pelvic fracture s/p mechanical fall at home Imaging showed bilateral comminuted mildly displaced fractures of the inferior and superior pubic rami extending into the pubic body bilaterally. Adjacent extraperitoneal blood products in the pelvis Nonoperative management per orthopedics PT/OT eval obtained. Family still wishes to bring patient back home with ongoing caretakers Currently on pain control with Tylenol, scheduled Roxicodone, PRN Robaxin, as needed morphine.   Bowel regimen to continue Outpatient follow-up with Dr. Linna Caprice in 2 to 3 weeks for repeat imaging  Essential hypertension Blood pressure controlled on Cardizem and metoprolol  Type 2 diabetes mellitus A1c 7.3 on 05/06/2023 Not on home meds. Currently on SSI/Accu-Cheks Recent Labs  Lab 05/08/23 2024 05/09/23 0731 05/09/23 1133 05/09/23 1629 05/12/23 1500  GLUCAP 161* 153* 189* 171* 192*    Post-surgical hypothyroidism Continue Synthroid  Chronic anemia GERD Continue Protonix, iron supplement.  On chronic anticoagulation.  No active bleeding Recent Labs    01/13/23 0543 01/14/23 0539 05/08/23 0445 05/09/23 0510 05/10/23 0528 05/11/23 0523 05/12/23 0601  HGB 10.9*   < > 9.0* 10.4* 10.5* 10.5* 10.4*  MCV 102.1*   < > 100.4* 97.9 98.2 97.0 97.6  VITAMINB12 1,335*  --   --   --   --   --   --   FERRITIN 58  --   --   --   --   --   --   TIBC 244*  --   --   --   --   --   --   IRON 52  --   --   --   --   --   --    < > =  values in this interval not displayed.    HLD Crestor to continue   Depression with anxiety Continue Zoloft 50 mg daily   Physical deconditioning Impaired mobility at risk for being bed bound with progressive decline PT OT eval obtained. Family wishing for taking patient home at d/c with ongoing caregivers  Will request out of bed with mobility today.    Goals of care   Code Status: Full Code     DVT prophylaxis:  apixaban (ELIQUIS) tablet 2.5 mg  Start: 05/09/23 1000 SCDs Start: 05/06/23 0735 apixaban (ELIQUIS) tablet 2.5 mg   Antimicrobials: None Fluid: None Consultants: None Family Communication: Caregiver at bedside  Status: Inpatient Level of care:  Telemetry   Patient from: Home Anticipated d/c to: Hopefully home in 1 to 2 days Needs to continue in-hospital care:  Remains in A-fib.  IV amiodarone drip continues at this time.   Diet:  Diet Order             DIET DYS 3 Room service appropriate? Yes; Fluid consistency: Thin  Diet effective now                   Scheduled Meds:  (feeding supplement) PROSource Plus  30 mL Oral BID BM   acetaminophen  1,000 mg Oral TID   apixaban  2.5 mg Oral BID   diltiazem  360 mg Oral Daily   ferrous sulfate  325 mg Oral Q breakfast   levothyroxine  150 mcg Oral QAC breakfast   LORazepam  0.5 mg Oral QHS   metoprolol tartrate  100 mg Oral BID   oxyCODONE  5 mg Oral Q6H   pantoprazole  40 mg Oral Daily   polyethylene glycol  17 g Oral Daily   potassium chloride SA  40 mEq Oral Daily   rosuvastatin  10 mg Oral QHS   senna-docusate  1 tablet Oral QHS   sertraline  50 mg Oral Daily    PRN meds: albuterol, diltiazem, HYDROmorphone (DILAUDID) injection, methocarbamol, metoprolol tartrate, ondansetron (ZOFRAN) IV, mouth rinse   Infusions:   amiodarone 30 mg/hr (05/13/23 0524)    Antimicrobials: Anti-infectives (From admission, onward)    None       Nutritional status:  Body mass index is 21.67 kg/m.          Objective: Vitals:   05/13/23 0600 05/13/23 0823  BP: (!) 124/91 (!) 135/92  Pulse: (!) 108 (!) 112  Resp: (!) 22 20  Temp:  (!) 97.5 F (36.4 C)  SpO2: 90% 92%    Intake/Output Summary (Last 24 hours) at 05/13/2023 1125 Last data filed at 05/13/2023 0854 Gross per 24 hour  Intake 894.53 ml  Output 550 ml  Net 344.53 ml    Filed Weights   05/06/23 0904  Weight: 60.9 kg   Weight change:  Body mass index is 21.67 kg/m.   Physical  Exam: General exam: Pleasant, elderly.  Not in pain currently Skin: No rashes, lesions or ulcers. HEENT: Atraumatic, normocephalic, no obvious bleeding Lungs: Clear to auscultation bilaterally CVS: Continues to be A-fib, rate controlled, no murmur GI/Abd soft, nontender, nondistended, bowel CNS: Opens eyes on verbal command.  Able to answer some questions. Psychiatry: Sad affect Extremities: No pedal edema, no calf tenderness  Data Review: I have personally reviewed the laboratory data and studies available.  F/u labs ordered Unresulted Labs (From admission, onward)     Start     Ordered   05/14/23 0500  Basic metabolic panel  Daily,   R      05/13/23 0737            Total time spent in review of labs and imaging, patient evaluation, formulation of plan, documentation and communication with family: 45 minutes  Signed, Lorin Glass, MD [facility] 05/13/2023

## 2023-05-13 NOTE — Significant Event (Signed)
Rapid response was called on the patient this afternoon around 1:50 p.m for respiratory distress. I attended immediately.  Rapid response team also came at bedside.  Patient was tachypneic on nonrebreather mask, maintaining saturation more than 90%.  She felt cold clammy, history of blood pressure was over 100. On calling her name, patient was able to open her eyes briefly but will fall back to sleep right away. She was gurgling and only noted to have minimal secretions on suction attempt.  Patient has been receiving oxycodone for pain control for the last 2 days.  With the suspicion of respiratory depression with opioid, Narcan was tried but without success. Stat chest x-ray was obtained which showed consolidation of the right lung base, also very patulous gas-filled esophagus and diffusely distended bowel.  Suspect aspiration pneumonia related to ileus and patulous esophagus CO2 retention needs to rule out.  Blood gas was tried but because of low blood pressure, could not be obtained.  Serial BMP from last few days showed normal CO2 level, so not likely to be hypercapnic.  Her mental status continued to worsen, blood pressure started to drop. She was hence moved to stepdown unit. Patient's caregiver has been at bedside since this morning. She did not notice any particular inciting event or incident. I had a conversation with patient's daughter Ms. Robin on the phone and she confirmed full CODE STATUS.  Case discussed and signed out to PCCM Dr. Myrla Halsted.  Came at bedside.  Preparation in process for intubation and central line insertion.

## 2023-05-13 NOTE — Consult Note (Signed)
NAME:  [name], MRN:  [id number], DOB:  January 02, 1931, LOS: 7 ADMISSION DATE:  04/28/2023, CONSULTATION DATE:  05/13/2023 REFERRING MD:  Dr. Pola Corn, CHIEF COMPLAINT:  hypotension    History of Present Illness:  89 yo FM, PMH OSA on CPAP, DM2, HTN, HLD, Afib on eliquis, hypothyroidism, lumbar stenosis, history of breast cancer s/p lumpectomy.  Patient was admitted to the hospital after being found to be in A-fib RVR and at mechanical fall at home with a traumatic pelvic fracture.  She was admitted to the floor.  She became more lethargic and hypotensive requiring critical care consultation.  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   Cervical cancer (HCC)    COLONIC POLYPS, ADENOMATOUS, HX OF 10/13/2010   Qualifier: Diagnosis of  By: Koleen Distance CMA (AAMA), Leisha     DJD (degenerative joint disease) of knee    EROSIVE ESOPHAGITIS 10/13/2010   Qualifier: Diagnosis of  By: Koleen Distance CMA Duncan Dull), Hulan Saas     Essential hypertension 10/13/2010   Qualifier: Diagnosis of  By: Koleen Distance CMA (AAMA), [place]aird Cancer, intermittent 07/09/2012   GERD (gastroesophageal reflux disease)    Glaucoma    Hiatal hernia s/p robotic repair & fundoplication 05/25/2017 10/13/2010   Qualifier: Diagnosis of  By: Koleen Distance CMA (AAMA), Hulan Saas     History of laser assisted in situ keratomileusis 07/25/2012   HYPERCHOLESTEROLEMIA 10/13/2010   Qualifier: Diagnosis of  By: Koleen Distance CMA (AAMA), Leisha     Hypothyroidism, postsurgical    h/o thyroid cancer s/p thyroidectomy   Iron Deficiency Anemia 10/14/2010   Qualifier: Diagnosis of  By: Jarold Motto MD Mervyn Skeeters R    Irritable bowel syndrome 10/13/2010    Qualifier: Diagnosis of  By: Koleen Distance CMA (AAMA), Leisha     Lumbar stenosis with neurogenic claudication 04/20/2016   OSA on CPAP    settings at 10-11    Type II diabetes mellitus (HCC)    type II      Significant Hospital Events: Including procedures, antibiotic start and stop dates in addition to other pertinent events     Interim History / Subjective:  Seen nearly unresponsive in ICU.  Objective   Blood pressure 103/81, pulse (!) 113, temperature 98.1 F (36.7 C), temperature source Rectal, resp. rate (!) 27, height 5\' 6"  (1.676 m), weight 60.9 kg, SpO2 (!) 88 %.        Intake/Output Summary (Last 24 hours) at 05/13/2023 1442 Last data filed at 05/13/2023 1200 Gross per 24 hour  Intake 867.81 ml  Output 550 ml  Net 317.81 ml   Filed Weights   05/06/23 0904  Weight: 60.9 kg    Examination: Lethargic, minimal resp effort Pupils reactive Ashen/cyanotic skin Heart irregular, ext cool 1-2+ edema [facility] Problem list     Assessment & Plan:  Acute hypoxemic resp failure- neuro intact prior to sudden worsening and high secretion burden along with CXR findings of ileus+esophageal dilation c/w aspiration event Shock state- related to aspiration Afib/RVR- on eliquis, CCB, metop and amio during admit, been difficult to control suspect related to everything else going on Ileus, esophageal dysmotility- post  fall Frail elderly Difficult IV stick- will place line Family would like everything done at present, they are at bedside. Hypothyroidism- TSH ok HLD, HTN, Fe def anemia, GERD, DM2  - Intubate - VAP prevention bundle, precedex/dilaudid for sedation - Zosyn, check lactate/Pct/Sputum cult - NGT to LIS, check KUB - Amio gtt for now given shock, can bolus PRN - Full dose lovenox instead of eliquis given inconsistent PO  Best Practice (right click and "Reselect all SmartList Selections" daily)   Diet/type: NPO DVT prophylaxis: systemic dose LMWH GI  prophylaxis: PPI Lines: Central line Foley:  Yes, and it is still needed Code Status:  full code Last date of multidisciplinary goals of care discussion [updated family at bedside]  Labs   CBC: Recent Labs  Lab 05/08/23 0445 05/09/23 0510 05/10/23 0528 05/11/23 0523 05/12/23 0601  WBC 9.9 9.7 8.8 9.0 9.0  NEUTROABS 7.8* 7.8* 6.7 6.9 6.8  HGB 9.0* 10.4* 10.5* 10.5* 10.4*  HCT 28.4* 32.1* 32.2* 31.9* 32.1*  MCV 100.4* 97.9 98.2 97.0 97.6  PLT 127* 154 164 208 215    Basic Metabolic Panel: Recent Labs  Lab 05/08/23 0445 05/09/23 0510 05/10/23 0528 05/11/23 0523 05/12/23 0601  NA 135 132* 133* 132* 130*  K 3.3* 3.6 3.7 3.9 4.2  CL 104 101 102 103 101  CO2 23 22 22  21* 23  GLUCOSE 188* 172* 187* 179* 199*  BUN 24* 21 20 21 23   CREATININE 0.51 0.60 0.61 0.61 0.63  CALCIUM 8.0* 8.4* 8.3* 8.5* 8.1*  MG 1.5* 1.9  --   --   --    GFR: Estimated Creatinine Clearance: 43.8 mL/min (by C-G formula based on SCr of 0.63 mg/dL). Recent Labs  Lab 05/09/23 0510 05/10/23 0528 05/11/23 0523 05/12/23 0601  WBC 9.7 8.8 9.0 9.0    Liver Function Tests: No results for input(s): "AST", "ALT", "ALKPHOS", "BILITOT", "PROT", "ALBUMIN" in the last 168 hours. No results for input(s): "LIPASE", "AMYLASE" in the last 168 hours. No results for input(s): "AMMONIA" in the last 168 hours.  ABG    Component Value Date/Time   PHART 7.55 (H) 01/21/2023 1650   PCO2ART 39 01/21/2023 1650   PO2ART 91 01/21/2023 1650   HCO3 34.1 (H) 01/21/2023 1650   TCO2 30 05/06/2023 0009   O2SAT 98.1 01/21/2023 1650     Coagulation Profile: No results for input(s): "INR", "PROTIME" in the last 168 hours.  Cardiac Enzymes: No results for input(s): "CKTOTAL", "CKMB", "CKMBINDEX", "TROPONINI" in the last 168 hours.  HbA1C: Hgb A1c MFr Bld  Date/Time Value Ref Range Status  05/06/2023 08:11 AM 7.3 (H) 4.8 - 5.6 % Final    Comment:    (NOTE) Pre diabetes:          5.7%-6.4%  Diabetes:               >6.4%  Glycemic control for   <7.0% adults with diabetes   12/12/2022 06:32 AM 5.9 (H) 4.8 - 5.6 % Final    Comment:    (NOTE) Pre diabetes:          5.7%-6.4%  Diabetes:              >6.4%  Glycemic control for   <7.0% adults with diabetes     CBG: Recent Labs  Lab 05/09/23 0731 05/09/23 1133 05/09/23 1629 05/12/23 1500 05/13/23 1349  GLUCAP 153* 189* 171* 192* 299*    Review of Systems:   Critically ill   Past Medical History:  She,  has a past medical history of A-fib (HCC) (2015), 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), Anxiety state (10/13/2010), Barrett's esophagus, Breast cancer (HCC), 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), Cervical cancer (HCC), COLONIC POLYPS, ADENOMATOUS, HX OF (10/13/2010), DJD (degenerative joint disease) of knee, EROSIVE ESOPHAGITIS (10/13/2010), Essential hypertension (10/13/2010), Exotropia, intermittent (07/09/2012), GERD (gastroesophageal reflux disease), Glaucoma, Hiatal hernia s/p robotic repair & fundoplication 05/25/2017 (10/13/2010), History of laser assisted in situ keratomileusis (07/25/2012), HYPERCHOLESTEROLEMIA (10/13/2010), Hypothyroidism, postsurgical, Iron Deficiency Anemia (10/14/2010), Irritable bowel syndrome (10/13/2010), Lumbar stenosis with neurogenic claudication (04/20/2016), OSA on CPAP, and Type II diabetes mellitus (HCC).   Surgical History:   Past Surgical History:  Procedure Laterality Date   ABDOMINAL HYSTERECTOMY     "partial"   BIOPSY  01/15/2023   Procedure: BIOPSY;  Surgeon: Hilarie Fredrickson, MD;  Location: [facility] ENDOSCOPY;  Service: Gastroenterology;;   BREAST BIOPSY Right    BREAST LUMPECTOMY Right    CARDIOVERSION N/A 03/30/2014   Procedure: CARDIOVERSION - BEDSIDE;  Surgeon: Wendall Stade, MD;  Location: [facility] OR;  Service: Cardiovascular;  Laterality: N/A;   CARDIOVERSION N/A 03/31/2014   Procedure: CARDIOVERSION  (BEDSIDE) ;  Surgeon: Lewayne Bunting, MD;  Location: [facility] OR;  Service: Cardiovascular;   Laterality: N/A;   CARDIOVERSION N/A 04/27/2014   Procedure: CARDIOVERSION;  Surgeon: Cassell Clement, MD;  Location: [facility] ENDOSCOPY;  Service: Cardiovascular;  Laterality: N/A;   CARDIOVERSION N/A 04/13/2015   Procedure: CARDIOVERSION;  Surgeon: Vesta Mixer, MD;  Location: [facility] ENDOSCOPY;  Service: Cardiovascular;  Laterality: N/A;   CARDIOVERSION N/A 01/08/2017   Procedure: CARDIOVERSION;  Surgeon: Thurmon Fair, MD;  Location: [facility] ENDOSCOPY;  Service: Cardiovascular;  Laterality: N/A;   CARDIOVERSION N/A 09/19/2017   Procedure: CARDIOVERSION;  Surgeon: Wendall Stade, MD;  Location: [facility] ENDOSCOPY;  Service: Cardiovascular;  Laterality: N/A;   CARDIOVERSION N/A 10/25/2017   Procedure: CARDIOVERSION;  Surgeon: Nahser, Deloris Ping, MD;  Location: [facility] ENDOSCOPY;  Service: Cardiovascular;  Laterality: N/A;   CARDIOVERSION N/A 12/27/2018   Procedure: CARDIOVERSION;  Surgeon: Parke Poisson, MD;  Location: [facility] ENDOSCOPY;  Service: Cardiovascular;  Laterality: N/A;   CARDIOVERSION N/A 05/20/2019   Procedure: CARDIOVERSION;  Surgeon: Parke Poisson, MD;  Location: [facility] ENDOSCOPY;  Service: Cardiovascular;  Laterality: N/A;   CARDIOVERSION N/A 12/10/2019   Procedure: CARDIOVERSION;  Surgeon: Chrystie Nose, MD;  Location: [facility] ENDOSCOPY;  Service: Cardiovascular;  Laterality: N/A;   CARDIOVERSION N/A 12/31/2019   Procedure: CARDIOVERSION;  Surgeon: Thurmon Fair, MD;  Location: [facility] ENDOSCOPY;  Service: Cardiovascular;  Laterality: N/A;   CARDIOVERSION N/A 10/21/2020   Procedure: CARDIOVERSION;  Surgeon: Parke Poisson, MD;  Location: [facility] ENDOSCOPY;  Service: Cardiovascular;  Laterality: N/A;   CARDIOVERSION N/A 12/03/2020   Procedure: CARDIOVERSION;  Surgeon: Jake Bathe, MD;  Location: [facility] ENDOSCOPY;  Service: Cardiovascular;  Laterality: N/A;   CARDIOVERSION N/A 07/11/2021   Procedure: CARDIOVERSION;  Surgeon: Chrystie Nose, MD;  Location: [facility] ENDOSCOPY;  Service: Cardiovascular;  Laterality: N/A;    CARPAL TUNNEL RELEASE Right    CATARACT EXTRACTION W/ INTRAOCULAR LENS  IMPLANT, BILATERAL Bilateral    COLONOSCOPY     COLONOSCOPY N/A 01/15/2023   Procedure: COLONOSCOPY;  Surgeon: Hilarie Fredrickson, MD;  Location: [facility] ENDOSCOPY;  Service: Gastroenterology;  Laterality: N/A;   EXCISIONAL HEMORRHOIDECTOMY     INSERTION OF MESH N/A 05/25/2017   Procedure: INSERTION OF MESH;  Surgeon: Axel Filler, MD;  Location: [facility] ORS;  Service: General;  Laterality: N/A;   TEE WITHOUT CARDIOVERSION N/A 03/30/2014  Procedure: TRANSESOPHAGEAL ECHOCARDIOGRAM (TEE);  Surgeon: Wendall Stade, MD;  Location: [facility] ENDOSCOPY;  Service: Cardiovascular;  Laterality: N/A;   THYROIDECTOMY     TONSILLECTOMY       Social History:   reports that she has never smoked. She has never used smokeless tobacco. She reports that she does not drink alcohol and does not use drugs.   Family History:  Her family history includes Diabetes in her brother, brother, brother, brother, brother, brother, brother, child, sister, sister, and sister; Heart attack in her brother; Heart disease in her brother, brother, brother, brother, brother, brother, and sister; Pancreatic cancer in her sister; Prostate cancer in her father.   Allergies Allergies  Allergen Reactions   Penicillins Itching, Other (See Comments) and Rash    Did it involve swelling of the face/tongue/throat, SOB, or low BP? No  Did it involve sudden or severe rash/hives, skin peeling, or any reaction on the inside of your mouth or nose? No  Did you need to seek medical attention at a hospital or doctor's office? No  When did it last happen?    30+ years    If all above answers are "NO", may proceed with cephalosporin use.     Home Medications  Prior to Admission medications   Medication Sig Start Date End Date Taking? Authorizing Provider  acetaminophen (TYLENOL) 650 MG CR tablet Take 650 mg by mouth in the morning and at bedtime.   Yes [provider]   apixaban (ELIQUIS) 5 MG TABS tablet Take 1 tablet (5 mg total) by mouth 2 (two) times daily. 12/18/22  Yes Newman Nip, NP  cholecalciferol (VITAMIN D) 25 MCG (1000 UNIT) tablet Take 1,000 Units by mouth daily.   Yes [provider]  diltiazem (CARDIZEM CD) 120 MG 24 hr capsule Take 1 capsule (120 mg total) by mouth daily. 02/21/23  Yes Conte, Tessa N, PA-C  ferrous sulfate 325 (65 FE) MG tablet Take 325 mg by mouth daily with breakfast.   Yes [provider]  lactase (LACTAID) 3000 units tablet Take 2 tablets (6,000 Units total) by mouth 3 (three) times daily with meals. Patient taking differently: Take 6,000 Units by mouth 3 (three) times daily with meals. As needed. 01/24/23  Yes Rodolph Bong, MD  levothyroxine (SYNTHROID) 150 MCG tablet Take 150 mcg by mouth every morning. 12/25/22  Yes [provider]  LORazepam (ATIVAN) 1 MG tablet Take 0.5 mg by mouth at bedtime. Take additional 0.5 mg if needed   Yes [provider]  metoprolol tartrate (LOPRESSOR) 100 MG tablet TAKE 1 TABLET(100 MG) BY MOUTH TWICE DAILY Patient taking differently: Take 100 mg by mouth 2 (two) times daily. 11/30/22  Yes Newman Nip, NP  pantoprazole (PROTONIX) 40 MG tablet Take 1 tablet (40 mg total) by mouth daily. 01/16/23  Yes Rhetta Mura, MD  Polyvinyl Alcohol-Povidone PF 1.4-0.6 % SOLN Place 1 drop into both eyes daily as needed (dry eyes).    Yes [provider]  potassium chloride (KLOR-CON) 10 MEQ tablet Take 10 mEq by mouth daily.   Yes [provider]  rosuvastatin (CRESTOR) 10 MG tablet Take 10 mg by mouth at bedtime.   Yes [provider]  saccharomyces boulardii (FLORASTOR) 250 MG capsule Take 1 capsule (250 mg total) by mouth 2 (two) times daily. 01/24/23  Yes Rodolph Bong, MD  sertraline (ZOLOFT) 50 MG tablet Take 50 mg by mouth in the morning.   Yes [provider]  vitamin B-12 (CYANOCOBALAMIN) 500 MCG tablet Take  500 mcg by mouth daily.   Yes [provider]  zinc gluconate 50 MG tablet Take 50 mg by mouth daily.   Yes [provider]  ACCU-CHEK GUIDE test strip  01/09/20   [provider]  metFORMIN (GLUCOPHAGE-XR) 500 MG 24 hr tablet Take 500 mg by mouth 2 (two) times daily. Patient not taking: Reported on 05/06/2023 02/11/23   [provider]  potassium chloride SA (KLOR-CON M) 20 MEQ tablet Take 2 tablets (40 mEq total) by mouth daily. Patient not taking: Reported on 05/06/2023 03/13/23   Sharlene Dory, PA-C     Critical care time: 35 mins independent of procedures

## 2023-05-13 NOTE — Procedures (Signed)
Intubation Procedure Note  ALFA HIGGS  [id number]  Mar 29, 1931  Date:05/13/23  Time:3:50 PM   Provider Performing:Saanvi Hakala C Katrinka Blazing    Procedure: Intubation (31500)  Indication(s) Respiratory Failure  Consent Unable to obtain consent due to emergent nature of procedure.   Anesthesia Etomidate, Versed, Fentanyl, and Rocuronium   Time Out Verified patient identification, verified procedure, site/side was marked, verified correct patient position, special equipment/implants available, medications/allergies/relevant history reviewed, required imaging and test results available.   Sterile Technique Usual hand hygeine, masks, and gloves were used   Procedure Description Patient positioned in bed supine.  Sedation given as noted above.  Patient was intubated with endotracheal tube using Glidescope.  View was Grade 1 full glottis .  Number of attempts was 1.  Colorimetric CO2 detector was consistent with tracheal placement.   Complications/Tolerance None; patient tolerated the procedure well. Chest X-ray is ordered to verify placement.   EBL Minimal   Specimen(s) None

## 2023-05-13 NOTE — Procedures (Signed)
Arterial Catheter Insertion Procedure Note  CLIO FALANA  [id number]  11-28-1930  Date:05/13/23  Time:5:11 PM    Provider Performing: Lorin Glass    Procedure: Insertion of Arterial Line (40981) with US guidance (19147)   Indication(s) Blood pressure monitoring and/or need for frequent ABGs  Consent Risks of the procedure as well as the alternatives and risks of each were explained to the patient and/or caregiver.  Consent for the procedure was obtained and is signed in the bedside chart  Anesthesia None   Time Out Verified patient identification, verified procedure, site/side was marked, verified correct patient position, special equipment/implants available, medications/allergies/relevant history reviewed, required imaging and test results available.   Sterile Technique Maximal sterile technique including full sterile barrier drape, hand hygiene, sterile gown, sterile gloves, mask, hair covering, sterile ultrasound probe cover (if used).   Procedure Description Area of catheter insertion was cleaned with chlorhexidine and draped in sterile fashion. With real-time ultrasound guidance an arterial catheter was placed into the right radial artery.  Appropriate arterial tracings confirmed on monitor.     Complications/Tolerance None; patient tolerated the procedure well.   EBL Minimal   Specimen(s) None

## 2023-05-13 NOTE — Progress Notes (Signed)
  Echocardiogram 2D Echocardiogram has been performed.  [name] 05/13/2023, 11:06 AM

## 2023-05-14 ENCOUNTER — Inpatient Hospital Stay (HOSPITAL_COMMUNITY): Payer: Medicare PPO

## 2023-05-14 DIAGNOSIS — I4821 Permanent atrial fibrillation: Secondary | ICD-10-CM

## 2023-05-14 DIAGNOSIS — R609 Edema, unspecified: Secondary | ICD-10-CM | POA: Diagnosis not present

## 2023-05-14 DIAGNOSIS — T17908A Unspecified foreign body in respiratory tract, part unspecified causing other injury, initial encounter: Secondary | ICD-10-CM

## 2023-05-14 DIAGNOSIS — J9601 Acute respiratory failure with hypoxia: Secondary | ICD-10-CM

## 2023-05-14 DIAGNOSIS — Z515 Encounter for palliative care: Secondary | ICD-10-CM

## 2023-05-14 DIAGNOSIS — A419 Sepsis, unspecified organism: Secondary | ICD-10-CM

## 2023-05-14 DIAGNOSIS — S32810A Multiple fractures of pelvis with stable disruption of pelvic ring, initial encounter for closed fracture: Secondary | ICD-10-CM | POA: Diagnosis not present

## 2023-05-14 DIAGNOSIS — Z66 Do not resuscitate: Secondary | ICD-10-CM

## 2023-05-14 DIAGNOSIS — Z7189 Other specified counseling: Secondary | ICD-10-CM

## 2023-05-14 LAB — CBC
HCT: 34.4 % — ABNORMAL LOW (ref 36.0–46.0)
Hemoglobin: 11.3 g/dL — ABNORMAL LOW (ref 12.0–15.0)
MCH: 32.3 pg (ref 26.0–34.0)
MCHC: 32.8 g/dL (ref 30.0–36.0)
MCV: 98.3 fL (ref 80.0–100.0)
Platelets: 250 10*3/uL (ref 150–400)
RBC: 3.5 MIL/uL — ABNORMAL LOW (ref 3.87–5.11)
RDW: 13.9 % (ref 11.5–15.5)
WBC: 18.2 10*3/uL — ABNORMAL HIGH (ref 4.0–10.5)
nRBC: 0.8 % — ABNORMAL HIGH (ref 0.0–0.2)

## 2023-05-14 LAB — BLOOD GAS, ARTERIAL
Acid-base deficit: 6.3 mmol/L — ABNORMAL HIGH (ref 0.0–2.0)
Bicarbonate: 19 mmol/L — ABNORMAL LOW (ref 20.0–28.0)
Drawn by: 21338
FIO2: 100 %
MECHVT: 470 mL
O2 Saturation: 100 %
Patient temperature: 37.3
pCO2 arterial: 36 mmHg (ref 32–48)
pH, Arterial: 7.33 — ABNORMAL LOW (ref 7.35–7.45)
pO2, Arterial: 219 mmHg — ABNORMAL HIGH (ref 83–108)

## 2023-05-14 LAB — COMPREHENSIVE METABOLIC PANEL
ALT: 50 U/L — ABNORMAL HIGH (ref 0–44)
AST: 72 U/L — ABNORMAL HIGH (ref 15–41)
Albumin: 2.3 g/dL — ABNORMAL LOW (ref 3.5–5.0)
Alkaline Phosphatase: 131 U/L — ABNORMAL HIGH (ref 38–126)
Anion gap: 8 (ref 5–15)
BUN: 39 mg/dL — ABNORMAL HIGH (ref 8–23)
CO2: 18 mmol/L — ABNORMAL LOW (ref 22–32)
Calcium: 7.7 mg/dL — ABNORMAL LOW (ref 8.9–10.3)
Chloride: 101 mmol/L (ref 98–111)
Creatinine, Ser: 1.13 mg/dL — ABNORMAL HIGH (ref 0.44–1.00)
GFR, Estimated: 46 mL/min — ABNORMAL LOW (ref >60)
Glucose, Bld: 231 mg/dL — ABNORMAL HIGH (ref 70–99)
Potassium: 5 mmol/L (ref 3.5–5.1)
Sodium: 127 mmol/L — ABNORMAL LOW (ref 135–145)
Total Bilirubin: 0.7 mg/dL (ref 0.3–1.2)
Total Protein: 5.5 g/dL — ABNORMAL LOW (ref 6.5–8.1)

## 2023-05-14 LAB — LACTIC ACID, PLASMA: Lactic Acid, Venous: 1.8 mmol/L (ref 0.5–1.9)

## 2023-05-14 LAB — MAGNESIUM: Magnesium: 1.5 mg/dL — ABNORMAL LOW (ref 1.7–2.4)

## 2023-05-14 LAB — GLUCOSE, CAPILLARY
Glucose-Capillary: 208 mg/dL — ABNORMAL HIGH (ref 70–99)
Glucose-Capillary: 211 mg/dL — ABNORMAL HIGH (ref 70–99)
Glucose-Capillary: 217 mg/dL — ABNORMAL HIGH (ref 70–99)
Glucose-Capillary: 289 mg/dL — ABNORMAL HIGH (ref 70–99)

## 2023-05-14 LAB — CULTURE, RESPIRATORY W GRAM STAIN

## 2023-05-14 LAB — PHOSPHORUS: Phosphorus: 4 mg/dL (ref 2.5–4.6)

## 2023-05-14 MED ORDER — PIPERACILLIN-TAZOBACTAM 3.375 G IVPB: 3.3750 g | Freq: Three times a day (TID) | INTRAVENOUS | Status: DC

## 2023-05-14 MED ORDER — MIDAZOLAM HCL 2 MG/2ML IJ SOLN
2.0000 mg | INTRAMUSCULAR | Status: DC | PRN
  Administered 2023-05-14: 2 mg via INTRAVENOUS
  Filled 2023-05-14: qty 2

## 2023-05-14 MED ORDER — SODIUM CHLORIDE 0.9 % IV SOLN: INTRAVENOUS | Status: DC

## 2023-05-14 MED ORDER — POLYETHYLENE GLYCOL 3350 17 G PO PACK: 17.0000 g | PACK | Freq: Every day | ORAL | Status: DC

## 2023-05-14 MED ORDER — PANTOPRAZOLE SODIUM 40 MG IV SOLR
40.0000 mg | Freq: Every day | INTRAVENOUS | Status: DC
  Administered 2023-05-14: 40 mg via INTRAVENOUS
  Filled 2023-05-14: qty 10

## 2023-05-14 MED ORDER — HYDROMORPHONE HCL-NACL 50-0.9 MG/50ML-% IV SOLN
0.0000 mg/h | INTRAVENOUS | Status: DC
  Administered 2023-05-14: 1.5 mg/h via INTRAVENOUS
  Filled 2023-05-14: qty 50

## 2023-05-14 MED ORDER — POLYVINYL ALCOHOL 1.4 % OP SOLN: 1.0000 [drp] | Freq: Four times a day (QID) | OPHTHALMIC | Status: DC | PRN

## 2023-05-14 MED ORDER — LACTATED RINGERS IV SOLN: INTRAVENOUS | Status: DC

## 2023-05-14 MED ORDER — DEXMEDETOMIDINE HCL IN NACL 400 MCG/100ML IV SOLN
0.0000 ug/kg/h | INTRAVENOUS | Status: DC
  Administered 2023-05-14: 1.2 ug/kg/h via INTRAVENOUS
  Filled 2023-05-14 (×3): qty 100

## 2023-05-14 MED ORDER — GLYCOPYRROLATE 1 MG PO TABS: 1.0000 mg | ORAL_TABLET | ORAL | Status: DC | PRN

## 2023-05-14 MED ORDER — GLYCOPYRROLATE 0.2 MG/ML IJ SOLN
0.2000 mg | INTRAMUSCULAR | Status: DC | PRN
  Administered 2023-05-14: 0.2 mg via INTRAVENOUS
  Filled 2023-05-14: qty 1

## 2023-05-14 MED ORDER — NOREPINEPHRINE 16 MG/250ML-% IV SOLN
0.0000 ug/min | INTRAVENOUS | Status: DC
  Administered 2023-05-14: 55 ug/min via INTRAVENOUS
  Filled 2023-05-14: qty 250

## 2023-05-14 MED ORDER — EPINEPHRINE HCL 5 MG/250ML IV SOLN IN NS: 0.5000 ug/min | INTRAVENOUS | Status: DC

## 2023-05-14 MED ORDER — HYDROMORPHONE BOLUS VIA INFUSION: 1.0000 mg | INTRAVENOUS | Status: DC | PRN

## 2023-05-14 MED ORDER — GLYCOPYRROLATE 0.2 MG/ML IJ SOLN: 0.2000 mg | INTRAMUSCULAR | Status: DC | PRN

## 2023-05-14 MED ORDER — EPINEPHRINE HCL 5 MG/250ML IV SOLN IN NS
INTRAVENOUS | Status: AC
  Administered 2023-05-14: 10 ug/min via INTRAVENOUS
  Filled 2023-05-14: qty 250

## 2023-05-14 MED ORDER — ACETAMINOPHEN 325 MG PO TABS: 650.0000 mg | ORAL_TABLET | Freq: Four times a day (QID) | ORAL | Status: DC | PRN

## 2023-05-14 MED ORDER — ACETAMINOPHEN 650 MG RE SUPP
650.0000 mg | Freq: Four times a day (QID) | RECTAL | Status: DC | PRN
  Administered 2023-05-15: 650 mg via RECTAL

## 2023-05-14 MED ORDER — NOREPINEPHRINE 4 MG/250ML-% IV SOLN: 0.0000 ug/min | INTRAVENOUS | Status: DC

## 2023-05-14 NOTE — IPAL (Signed)
  Interdisciplinary Goals of Care Family Meeting   Date carried out: 05/14/2023  Location of the meeting: Bedside  Member's involved: Nurse Practitioner and Family Member or next of kin  Durable Power of Attorney or Environmental health practitioner: Daughter    Discussion: We discussed goals of care for [name] .  She has continued to decline. We discussed the path of continuing on a time trial (pts previously documented wishes) vs transitioning to comfort care.   Discussed this with several family members individually and then collectively as a group. All are in agreement with transition to comfort care    Code status:   Code Status: DNR   Disposition: In-patient comfort care  Time spent for the meeting:     Lanier Clam, NP  05/14/2023, 3:27 PM

## 2023-05-14 NOTE — IPAL (Signed)
  Interdisciplinary Goals of Care Family Meeting   Date carried out: 05/14/2023  Location of the meeting: Phone conference  Member's involved: Nurse Practitioner and Family Member or next of kin  Durable Power of Attorney or acting medical decision maker: aughter who is HCPOA [name]     Discussion: We discussed goals of care for [name] .  Discussed interval decline, my concern is that [name] is actively dying. She is now on 3 pressors and remains in profound shock.   [name] asked that I share this with her husband as well. We talked about Code status. I recommend DNR -- that if despite all efforts to prevent arrest [name]'s heart stops that we allow a natural death and do not perform chest compressions/DF. They agree with this recommendation  [name] shares that she has just learned of documentation w [name]'s lawyer indicating [name]'s previously expressed wishes to try efforts like MV x 5 days and if no improvement, transition to comfort care.  I appreciated the sharing of this information, and expressed that I am concerned the present situation is more dire and she may not survive the day. If she stabilizes we can revisit the idea of a time trial vs ongoing GOC otherwise     Code status:   Code Status: DNR   Disposition: Continue current acute care  Time spent for the meeting: 8 min     Lanier Clam, NP  05/14/2023, 11:37 AM

## 2023-05-14 NOTE — Progress Notes (Signed)
Pharmacy Antibiotic Note  [name] is a 89 y.o. female admitted on 04/20/2023 after fall at home, pelvic fracture, and Afib.  She was transferred to ICU and intubated for hypoxia on 6/30.  Pharmacy has been consulted to escalate antibiotic coverage with Zosyn dosing for aspiration and possible intra-abdominal infection given worsening shock.    SCr up to 1.13, Tm 102.7, WBC up to 18.2  Plan: Zosyn 3.375g IV Q8H infused over 4hrs.  Follow up renal function, culture results, and clinical course.   Height: 5\' 6"  (167.6 cm) Weight: 60.9 kg (134 lb 4.2 oz) IBW/kg (Calculated) : 59.3  Temp (24hrs), Avg:98.2 F (36.8 C), Min:91.2 F (32.9 C), Max:102.7 F (39.3 C)  Recent Labs  Lab 05/10/23 0528 05/11/23 0523 05/12/23 0601 05/13/23 1713 05/14/23 0053 05/14/23 0806  WBC 8.8 9.0 9.0 17.1*  --  18.2*  CREATININE 0.61 0.61 0.63 0.93  --  1.13*  LATICACIDVEN  --   --   --  3.1* 1.8  --     Estimated Creatinine Clearance: 31 mL/min (A) (by C-G formula based on SCr of 1.13 mg/dL (H)).    Allergies  Allergen Reactions   Penicillins Itching, Other (See Comments) and Rash    Did it involve swelling of the face/tongue/throat, SOB, or low BP? No  Did it involve sudden or severe rash/hives, skin peeling, or any reaction on the inside of your mouth or nose? No  Did you need to seek medical attention at a hospital or doctor's office? No  When did it last happen?    30+ years    If all above answers are "NO", may proceed with cephalosporin use.    Antimicrobials this admission: 6/30 CTX>>7/1 7/1 Zosyn >>   Microbiology results: 6/30 Resp cxt: few GPC, rare GNR  Thank you for allowing pharmacy to be a part of this patient's care.  Lynann Beaver PharmD, BCPS [facility] main pharmacy [phone] 05/14/2023 11:04 AM

## 2023-05-14 NOTE — Progress Notes (Signed)
OT Cancellation Note  Patient Details Name: [name] MRN: [id number] DOB: 1931-05-10   Cancelled Treatment:    Reason Eval/Treat Not Completed: Other (comment) Patient discharged from OT services secondary to medical decline - will need to re-order OT to resume therapy services.   Please see latest therapy progress note for current level of functioning and progress toward goals Rosalio Loud, MS Acute Rehabilitation Department Office# [phone]  Selinda Flavin 05/14/2023, 1:02 PM

## 2023-05-14 NOTE — Progress Notes (Signed)
NAME:  [name], MRN:  [id number], DOB:  Apr 01, 1931, LOS: 8 ADMISSION DATE:  04/27/2023, CONSULTATION DATE:  05/13/2023 REFERRING MD:  Dr. Pola Corn, CHIEF COMPLAINT:  hypotension    History of Present Illness:  89 yo FM, PMH OSA on CPAP, DM2, HTN, HLD, Afib on eliquis, hypothyroidism, lumbar stenosis, history of breast cancer s/p lumpectomy.  Patient was admitted to the hospital after being found to be in A-fib RVR and at mechanical fall at home with a traumatic pelvic fracture.  She was admitted to the floor.  She became more lethargic and hypotensive requiring critical care consultation.  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   Cervical cancer (HCC)    COLONIC POLYPS, ADENOMATOUS, HX OF 10/13/2010   Qualifier: Diagnosis of  By: Koleen Distance CMA (AAMA), Leisha     DJD (degenerative joint disease) of knee    EROSIVE ESOPHAGITIS 10/13/2010   Qualifier: Diagnosis of  By: Koleen Distance CMA Duncan Dull), Hulan Saas     Essential hypertension 10/13/2010   Qualifier: Diagnosis of  By: Koleen Distance CMA (AAMA), [place]aird Cancer, intermittent 07/09/2012   GERD (gastroesophageal reflux disease)    Glaucoma    Hiatal hernia s/p robotic repair & fundoplication 05/25/2017 10/13/2010   Qualifier: Diagnosis of  By: Koleen Distance CMA (AAMA), Hulan Saas     History of laser assisted in situ keratomileusis 07/25/2012   HYPERCHOLESTEROLEMIA 10/13/2010   Qualifier: Diagnosis of  By: Koleen Distance CMA (AAMA), Leisha     Hypothyroidism, postsurgical    h/o thyroid cancer s/p thyroidectomy   Iron Deficiency Anemia 10/14/2010   Qualifier: Diagnosis of  By: Jarold Motto MD Mervyn Skeeters R    Irritable bowel syndrome 10/13/2010    Qualifier: Diagnosis of  By: Koleen Distance CMA (AAMA), Leisha     Lumbar stenosis with neurogenic claudication 04/20/2016   OSA on CPAP    settings at 10-11    Type II diabetes mellitus (HCC)    type II      Significant Hospital Events: Including procedures, antibiotic start and stop dates in addition to other pertinent events   6/23 admit w pelvic ramus fx. Ortho cs non surgical. Admit TRH  6/30 Cards consult for Afib. PCCM consult for AMS shock. ICU transfer intubated cvc art line. Abd XR w concern for ileus vs obstruction  7/1 worse shock   Interim History / Subjective:  Worse shock this morning   No labs back   Objective   Blood pressure (!) 149/109, pulse (!) 107, temperature 98.2 F (36.8 C), resp. rate (!) 24, height 5\' 6"  (1.676 m), weight 60.9 kg, SpO2 100 %.    Vent Mode: PRVC FiO2 (%):  [50 %-100 %] 50 % Set Rate:  [24 bmp] 24 bmp Vt Set:  [470 mL] 470 mL PEEP:  [5 cmH20] 5 cmH20 Plateau Pressure:  [13 cmH20-25 cmH20] 16 cmH20   Intake/Output Summary (Last 24 hours) at 05/14/2023 1107 Last data filed at 05/14/2023 0827 Gross per 24 hour  Intake 3002.82 ml  Output 725 ml  Net 2277.82 ml   Filed Weights   05/06/23 0904  Weight: 60.9 kg    Examination: General:  Critically ill elderly F intubated sedated  Neuro: Sedated, grimaces to pain does not follow commands  HEENT: ETT secure anicteric sclera pink mm  CV: tachycardic, irreg.  Pulm: CTA b on MV.  GI: Tense distended absent bowel sounds with generalized tenderness  GU: foley  MSK: no acute joint deformity. BLE edema  Skin: pale c/d/w   Resolved Hospital Problem list     Assessment & Plan:   Acute hypoxic respiratory failure Aspiration event  P -cont MV support -VAP, pulm hygiene -rocephin to  zosyn  -ABG, wean mv as able. Not getting a good pleth with degree of shock   Shock, distributive (favor septic r/t aspiration, worry about ?perf or worsening acidosis) -worse shock overnight P -check ABG cbc  cmp -incr NE ceiling to 80, cont vaso. Next would add epi  -pending labs above possible bicarb  -add mIVF LR at 75/hr   Ileus Esophageal dysmotility   P -upright Xray looking for pneumoperitoneum -is very unstable, not sure we could go down for CT a/p right now but this would be preferable  -pending above, possible CCS consult   Hyperkalemia  NAGMA AKI, improved  P -repeat CMP pending   -follow UOP  Afib RVR P -amio -dc PRN metop  -optimize lytes   Hypothyroidism  -synthroid   Hx HTN HLD -holding antihypertensives w shock  -crestor   GERD P -PPI   Fe def Anemia  P -follow CBC   DM2 w hyperglycemia  P -SSI   Physical frailty  Pubic rami fx due to mechanical fall  -Prior to current decompensation pt had been working w pt, fam goal was dc home w home caregivers   GOC -CCM spoke w family 6/30 at which time pt was determined to be full code full scope of offered medical intervention -Worry this may need to be revisited in wake of decompensation if unable to improve   Best Practice (right click and "Reselect all SmartList Selections" daily)   Diet/type: NPO DVT prophylaxis: systemic dose LMWH GI prophylaxis: PPI Lines: Central line Foley:  Yes, and it is still needed Code Status:  full code Last date of multidisciplinary goals of care discussion [updated family at bedside]  Labs   CBC: Recent Labs  Lab 05/09/23 0510 05/10/23 0528 05/11/23 0523 05/12/23 0601 05/13/23 1713 05/14/23 0806  WBC 9.7 8.8 9.0 9.0 17.1* 18.2*  NEUTROABS 7.8* 6.7 6.9 6.8 15.0*  --   HGB 10.4* 10.5* 10.5* 10.4* 11.0* 11.3*  HCT 32.1* 32.2* 31.9* 32.1* 34.5* 34.4*  MCV 97.9 98.2 97.0 97.6 101.5* 98.3  PLT 154 164 208 215 244 250    Basic Metabolic Panel: Recent Labs  Lab 05/08/23 0445 05/09/23 0510 05/10/23 0528 05/11/23 0523 05/12/23 0601 05/13/23 1713 05/14/23 0806  NA 135 132* 133* 132* 130* 132* 127*  K 3.3* 3.6 3.7 3.9 4.2 5.5* 5.0  CL 104 101 102 103  101 103 101  CO2 23 22 22  21* 23 21* 18*  GLUCOSE 188* 172* 187* 179* 199* 374* 231*  BUN 24* 21 20 21 23  36* 39*  CREATININE 0.51 0.60 0.61 0.61 0.63 0.93 1.13*  CALCIUM 8.0* 8.4* 8.3* 8.5* 8.1* 7.8* 7.7*  MG 1.5* 1.9  --   --   --   --  1.5*  PHOS  --   --   --   --   --   --  4.0   GFR: Estimated Creatinine Clearance: 31 mL/min (A) (by C-G formula based on SCr of  1.13 mg/dL (H)). Recent Labs  Lab 05/11/23 0523 05/12/23 0601 05/13/23 1713 05/14/23 0053 05/14/23 0806  PROCALCITON  --   --  0.26  --   --   WBC 9.0 9.0 17.1*  --  18.2*  LATICACIDVEN  --   --  3.1* 1.8  --     Liver Function Tests: Recent Labs  Lab 05/14/23 0806  AST 72*  ALT 50*  ALKPHOS 131*  BILITOT 0.7  PROT 5.5*  ALBUMIN 2.3*   No results for input(s): "LIPASE", "AMYLASE" in the last 168 hours. No results for input(s): "AMMONIA" in the last 168 hours.  ABG    Component Value Date/Time   PHART 7.33 (L) 05/14/2023 0745   PCO2ART 36 05/14/2023 0745   PO2ART 219 (H) 05/14/2023 0745   HCO3 19.0 (L) 05/14/2023 0745   TCO2 30 05/06/2023 0009   ACIDBASEDEF 6.3 (H) 05/14/2023 0745   O2SAT 100 05/14/2023 0745     Coagulation Profile: No results for input(s): "INR", "PROTIME" in the last 168 hours.  Cardiac Enzymes: No results for input(s): "CKTOTAL", "CKMB", "CKMBINDEX", "TROPONINI" in the last 168 hours.  HbA1C: Hgb A1c MFr Bld  Date/Time Value Ref Range Status  05/06/2023 08:11 AM 7.3 (H) 4.8 - 5.6 % Final    Comment:    (NOTE) Pre diabetes:          5.7%-6.4%  Diabetes:              >6.4%  Glycemic control for   <7.0% adults with diabetes   12/12/2022 06:32 AM 5.9 (H) 4.8 - 5.6 % Final    Comment:    (NOTE) Pre diabetes:          5.7%-6.4%  Diabetes:              >6.4%  Glycemic control for   <7.0% adults with diabetes     CBG: Recent Labs  Lab 05/13/23 1949 05/13/23 2039 05/14/23 0033 05/14/23 0403 05/14/23 0815  GLUCAP 329* 324* 289* 211* 217*    CRITICAL  CARE Performed by: Lanier Clam   Total critical care time: 41 minutes  Critical care time was exclusive of separately billable procedures and treating other patients.  Critical care was necessary to treat or prevent imminent or life-threatening deterioration.  Critical care was time spent personally by me on the following activities: development of treatment plan with patient and/or surrogate as well as nursing, discussions with consultants, evaluation of patient's response to treatment, examination of patient, obtaining history from patient or surrogate, ordering and performing treatments and interventions, ordering and review of laboratory studies, ordering and review of radiographic studies, pulse oximetry and re-evaluation of patient's condition.  Tessie Fass MSN, AGACNP-BC [place] [facility] Amion for pager  05/14/2023, 11:07 AM

## 2023-05-14 NOTE — Progress Notes (Signed)
Physical Therapy Discharge Patient Details Name: [name] MRN: [id number] DOB: 1931-01-06 Today's Date: 05/14/2023 Time:  -     Patient discharged from PT services secondary to medical decline - will need to re-order PT to resume therapy services.  Please see latest therapy progress note for current level of functioning and progress toward goals.    Blanchard Kelch PT Acute Rehabilitation Services Office [phone] Weekend pager-[phone]  GP     Rada Hay 05/14/2023, 12:58 PM

## 2023-05-14 NOTE — Progress Notes (Signed)
Brief Nutrition Note  Patient screened due to being on mechanical ventilation. No family at bedside during visit. Patient noted to have ileus and is now requiring 3 pressors. Per CCM NP note, GOC discussions remain ongoing with family. Patient remains in profound shock and NP note indicates concern she may not survive the day. Patient now DNR. Will follow up next day pending progression and GOC.   Shelle Iron RD, LDN For contact information, refer to [facility].

## 2023-05-14 NOTE — Progress Notes (Signed)
SLP Cancellation Note  Patient Details Name: [name] MRN: [id number] DOB: 06/05/1931   Cancelled treatment:       Reason Eval/Treat Not Completed: Medical issues which prohibited therapy;Other (comment) (Patient emergently intubated on 05/13/23. SLP will follow.)   Angela Nevin, MA, CCC-SLP Speech Therapy

## 2023-05-14 NOTE — Progress Notes (Signed)
BLE venous duplex has been completed.   Results can be found under chart review under CV PROC. 05/14/2023 3:11 PM Dorrian Doggett RVT, RDMS

## 2023-05-14 NOTE — Progress Notes (Signed)
eLink Physician-Brief Progress Note Patient Name: [name] DOB: 1931-09-28 MRN: [id number]   Date of Service  05/14/2023  HPI/Events of Note  Received request for restraints Patient seen intubated and a risk for self harm by pulling lines and tubes  eICU Interventions  Bilateral soft wrist restraints renewed Bedside team to assess in am if restraints to be continued     Intervention Category Intermediate Interventions: Other:  Darl Pikes 05/14/2023, 5:09 AM

## 2023-05-14 NOTE — Procedures (Signed)
Extubation Procedure Note  Patient Details:   Name: [name] DOB: November 09, 1931 MRN: [id number]   Airway Documentation:  Airway 7.5 mm (Active)  Secured at (cm) 24 cm 05/14/23 1141  Measured From Lips 05/14/23 1141  Secured Location Center 05/14/23 1141  Secured By [employer] 05/14/23 1141  Tube Holder Repositioned Yes 05/14/23 1141  Prone position No 05/14/23 0740  Cuff Pressure (cm H2O) Green OR 18-26 CmH2O 05/14/23 0740  Site Condition Cool;Dry 05/14/23 0740   Vent end date: (not recorded) Vent end time: (not recorded)   Evaluation  O2 sats: currently acceptable Complications: No apparent complications Patient did tolerate procedure well. Bilateral Breath Sounds: Clear   No  Pt extubated to comfort care.  Pt placed on 4L [place].  PT is a DNR/DNI.  RN at bedside.   Cain Sieve 05/14/2023, 6:00 PM

## 2023-05-14 NOTE — Progress Notes (Signed)
PT Cancellation Note  Patient Details Name: [name] MRN: [id number] DOB: 1931-08-05   Cancelled Treatment:    Reason Eval/Treat Not Completed: Medical issues which prohibited therapy  Patient is on Ventilator. Blanchard Kelch PT Acute Rehabilitation Services Office [phone] Weekend pager-[phone]  Rada Hay 05/14/2023, 8:10 AM

## 2023-05-14 NOTE — Progress Notes (Addendum)
Rounding Note    Patient Name: [name] Date of Encounter: 05/14/2023  [facility] Cardiologist: Tonny Bollman, MD   Subjective   Intubated. Had acute respiratory event yesterday and back on amiodarone.  Inpatient Medications    Scheduled Meds:  (feeding supplement) PROSource Plus  30 mL Oral BID BM   Chlorhexidine Gluconate Cloth  6 each Topical Daily   enoxaparin (LOVENOX) injection  1 mg/kg Subcutaneous Q12H   insulin aspart  0-9 Units Subcutaneous Q4H   levothyroxine  150 mcg Per Tube QAC breakfast   mouth rinse  15 mL Mouth Rinse Q2H   polyethylene glycol  17 g Oral Daily   rosuvastatin  10 mg Per Tube QHS   sertraline  50 mg Per Tube Daily   sodium chloride flush  10-40 mL Intracatheter Q12H   Continuous Infusions:  sodium chloride 20 mL/hr at 05/14/23 0545   amiodarone 30 mg/hr (05/14/23 0545)   cefTRIAXone (ROCEPHIN)  IV Stopped (05/13/23 1814)   dexmedetomidine (PRECEDEX) IV infusion 1.2 mcg/kg/hr (05/14/23 2952)   HYDROmorphone 0.75 mg/hr (05/14/23 0545)   norepinephrine (LEVOPHED) Adult infusion 36 mcg/min (05/14/23 0629)   vasopressin 0.03 Units/min (05/14/23 0545)   PRN Meds: acetaminophen, albuterol, HYDROmorphone, methocarbamol, metoprolol tartrate, ondansetron (ZOFRAN) IV, mouth rinse, mouth rinse, sodium chloride flush   Vital Signs    Vitals:   05/14/23 0300 05/14/23 0400 05/14/23 0500 05/14/23 0600  BP: 124/64 (!) 134/91 (!) 143/100 124/83  Pulse:  88    Resp: (!) 24 (!) 24 15 (!) 22  Temp: (!) 100.4 F (38 C) 99.7 F (37.6 C) 99.3 F (37.4 C) 99.1 F (37.3 C)  TempSrc:      SpO2:  100%    Weight:      Height:        Intake/Output Summary (Last 24 hours) at 05/14/2023 0753 Last data filed at 05/14/2023 0545 Gross per 24 hour  Intake 2569.18 ml  Output 975 ml  Net 1594.18 ml      05/06/2023    9:04 AM 02/27/2023    1:19 PM 02/21/2023    2:09 PM  Last 3 Weights  Weight (lbs) 134 lb 4.2 oz 136 lb 134 lb 9.6 oz   Weight (kg) 60.9 kg 61.689 kg 61.054 kg      Telemetry    Atrial fibrillation heart rate 80s to 100- Personally Reviewed  ECG    No new tracings- Personally Reviewed  Physical Exam   GEN: Intubated Neck: No JVD Cardiac: Irregularly irregular.  Respiratory: Clear to auscultation bilaterally. GI: Soft, nontender, non-distended  MS: Trace edema Neuro:  Nonfocal  Psych: Normal affect   Labs    High Sensitivity Troponin:  No results for input(s): "TROPONINIHS" in the last 720 hours.   Chemistry Recent Labs  Lab 05/08/23 0445 05/09/23 0510 05/10/23 0528 05/11/23 0523 05/12/23 0601 05/13/23 1713  NA 135 132*   < > 132* 130* 132*  K 3.3* 3.6   < > 3.9 4.2 5.5*  CL 104 101   < > 103 101 103  CO2 23 22   < > 21* 23 21*  GLUCOSE 188* 172*   < > 179* 199* 374*  BUN 24* 21   < > 21 23 36*  CREATININE 0.51 0.60   < > 0.61 0.63 0.93  CALCIUM 8.0* 8.4*   < > 8.5* 8.1* 7.8*  MG 1.5* 1.9  --   --   --   --   GFRNONAA >60 >  60   < > >60 >60 58*  ANIONGAP 8 9   < > 8 6 8    < > = values in this interval not displayed.    Lipids No results for input(s): "CHOL", "TRIG", "HDL", "LABVLDL", "LDLCALC", "CHOLHDL" in the last 168 hours.  Hematology Recent Labs  Lab 05/11/23 0523 05/12/23 0601 05/13/23 1713  WBC 9.0 9.0 17.1*  RBC 3.29* 3.29* 3.40*  HGB 10.5* 10.4* 11.0*  HCT 31.9* 32.1* 34.5*  MCV 97.0 97.6 101.5*  MCH 31.9 31.6 32.4  MCHC 32.9 32.4 31.9  RDW 13.4 13.6 13.9  PLT 208 215 244   Thyroid  Recent Labs  Lab 05/08/23 0445  TSH 1.200  FREET4 1.25*    BNPNo results for input(s): "BNP", "PROBNP" in the last 168 hours.  DDimer No results for input(s): "DDIMER" in the last 168 hours.   Radiology    DG Abd 1 View  Result Date: 05/13/2023 CLINICAL DATA:  Central line placement.  Possible ileus. EXAM: ABDOMEN - 1 VIEW COMPARISON:  01/10/2023, CT 05/06/2023 FINDINGS: Prominent air-filled colonic loops with the largest over the right abdomen measuring 13.8 cm. This  may represent dilated sigmoid loop versus cecum. There are a few air-filled nondilated small bowel loops present over the lower abdomen/pelvis. No definite free peritoneal air. Remainder the exam is unchanged including several pelvic fractures. IMPRESSION: Prominent air-filled colonic loops with the largest over the right abdomen measuring 13.8 cm. This may represent dilated sigmoid loop versus cecum. Findings could be due to colonic ileus versus obstruction. Recommend CT for further evaluation. Electronically Signed   By: Elberta Fortis M.D.   On: 05/13/2023 16:37   DG Chest 1 View  Result Date: 05/13/2023 CLINICAL DATA:  Endotracheal tube, OG tube in position EXAM: CHEST  1 VIEW COMPARISON:  Same day chest radiograph, 2:11 p.m. FINDINGS: Interval placement of esophagogastric tube, tip and side port below the diaphragm. Interval placement of endotracheal tube, tip projecting over the midtrachea. Unchanged atelectasis or consolidation of the right lung base. Cardiomegaly. Unchanged diffusely distended bowel in the included upper abdomen. IMPRESSION: 1. Interval placement of esophagogastric tube, tip and side port below the diaphragm. 2. Interval placement of endotracheal tube, tip projecting over the midtrachea. 3. Unchanged atelectasis or consolidation of the right lung base. 4. Unchanged diffusely distended bowel in the included upper abdomen. Electronically Signed   By: Jearld Lesch M.D.   On: 05/13/2023 16:32   DG Chest 1 View  Result Date: 05/13/2023 CLINICAL DATA:  Shortness of breath EXAM: CHEST  1 VIEW COMPARISON:  05/06/2023 FINDINGS: Very rotated, limited low volume AP portable examination. Atelectasis or consolidation of the right lung base. Left lung is normally aerated. Cardiomegaly. Very patulous, gas-filled esophagus. Diffusely distended bowel in the included upper abdomen. IMPRESSION: 1. Very rotated, limited low volume AP portable examination. 2. Atelectasis or consolidation of the right  lung base. 3. Cardiomegaly. 4. Very patulous, gas-filled esophagus. Diffusely distended bowel in the included upper abdomen. Electronically Signed   By: Jearld Lesch M.D.   On: 05/13/2023 14:33   ECHOCARDIOGRAM COMPLETE  Result Date: 05/13/2023    ECHOCARDIOGRAM REPORT   Patient Name:   [name] Date of Exam: 05/13/2023 Medical Rec #:  [id number]        Height:       66.0 in Accession #:    [id number]       Weight:       134.3 lb Date of Birth:  1931/03/27  BSA:          1.688 m Patient Age:    89 years         BP:           125/78 mmHg Patient Gender: F                HR:           100 bpm. Exam Location:  Inpatient Procedure: 2D Echo, Color Doppler and Cardiac Doppler Indications:    Atrial fibrillation  History:        Patient has prior history of Echocardiogram examinations, most                 recent 01/22/2023. Cardiomyopathy; Risk Factors:Diabetes, Sleep                 Apnea and Hypertension. BC.  Sonographer:    Milda Smart Referring Phys: [id number] [facility]  Sonographer Comments: No subcostal window. Image acquisition challenging due to patient body habitus and Image acquisition challenging due to respiratory motion. Pt unable to lay in optimal position due to bilateral pelvic fracture. IMPRESSIONS  1. Left ventricular ejection fraction, by estimation, is 65 to 70%. The left ventricle has normal function. The left ventricle has no regional wall motion abnormalities. There is moderate concentric left ventricular hypertrophy. Left ventricular diastolic function could not be evaluated.  2. Right ventricular systolic function is mildly reduced. The right ventricular size is moderately enlarged.  3. Left atrial size was severely dilated.  4. Right atrial size was severely dilated.  5. The mitral valve is grossly normal. Trivial mitral valve regurgitation. No evidence of mitral stenosis.  6. Tricuspid valve regurgitation is mild to moderate.  7. The aortic valve is tricuspid. Aortic valve  regurgitation is mild. Aortic valve sclerosis is present, with no evidence of aortic valve stenosis. Comparison(s): No significant change from prior study. FINDINGS  Left Ventricle: Left ventricular ejection fraction, by estimation, is 65 to 70%. The left ventricle has normal function. The left ventricle has no regional wall motion abnormalities. The left ventricular internal cavity size was normal in size. There is  moderate concentric left ventricular hypertrophy. Left ventricular diastolic function could not be evaluated due to atrial fibrillation. Left ventricular diastolic function could not be evaluated. Right Ventricle: The right ventricular size is moderately enlarged. No increase in right ventricular wall thickness. Right ventricular systolic function is mildly reduced. Left Atrium: Left atrial size was severely dilated. Right Atrium: Right atrial size was severely dilated. Pericardium: There is no evidence of pericardial effusion. Mitral Valve: The mitral valve is grossly normal. Trivial mitral valve regurgitation. No evidence of mitral valve stenosis. Tricuspid Valve: The tricuspid valve is grossly normal. Tricuspid valve regurgitation is mild to moderate. No evidence of tricuspid stenosis. Aortic Valve: The aortic valve is tricuspid. Aortic valve regurgitation is mild. Aortic valve sclerosis is present, with no evidence of aortic valve stenosis. Pulmonic Valve: The pulmonic valve was grossly normal. Pulmonic valve regurgitation is trivial. No evidence of pulmonic stenosis. Aorta: The aortic root and ascending aorta are structurally normal, with no evidence of dilitation. Venous: The inferior vena cava was not well visualized. IAS/Shunts: The interatrial septum was not well visualized.  LEFT VENTRICLE PLAX 2D LVIDd:         3.60 cm     Diastology LVIDs:         2.80 cm     LV e' medial:  5.55 cm/s LV PW:  1.40 cm     LV e' lateral: 9.23 cm/s LV IVS:        1.40 cm LVOT diam:     2.00 cm LVOT Area:      3.14 cm  LV Volumes (MOD) LV vol d, MOD A2C: 20.5 ml LV vol d, MOD A4C: 44.6 ml LV vol s, MOD A2C: 9.3 ml LV vol s, MOD A4C: 19.2 ml LV SV MOD A2C:     11.2 ml LV SV MOD A4C:     44.6 ml LV SV MOD BP:      17.8 ml RIGHT VENTRICLE RV S prime:     7.83 cm/s TAPSE (M-mode): 1.4 cm LEFT ATRIUM             Index        RIGHT ATRIUM           Index LA diam:        4.90 cm 2.90 cm/m   RA Area:     25.10 cm LA Vol (A2C):   76.1 ml 45.08 ml/m  RA Volume:   80.50 ml  47.68 ml/m LA Vol (A4C):   89.9 ml 53.25 ml/m LA Biplane Vol: 83.0 ml 49.16 ml/m   AORTA Ao Root diam: 3.50 cm Ao Asc diam:  3.80 cm TRICUSPID VALVE TR Peak grad:   42.2 mmHg TR Mean grad:   28.0 mmHg TR Vmax:        325.00 cm/s TR Vmean:       257.0 cm/s  SHUNTS Systemic Diam: 2.00 cm Lennie Odor MD Electronically signed by Lennie Odor MD Signature Date/Time: 05/13/2023/11:36:41 AM    Final     Cardiac Studies   Echocardiogram 05/13/2023 1. Left ventricular ejection fraction, by estimation, is 65 to 70%. The  left ventricle has normal function. The left ventricle has no regional  wall motion abnormalities. There is moderate concentric left ventricular  hypertrophy. Left ventricular  diastolic function could not be evaluated.   2. Right ventricular systolic function is mildly reduced. The right  ventricular size is moderately enlarged.   3. Left atrial size was severely dilated.   4. Right atrial size was severely dilated.   5. The mitral valve is grossly normal. Trivial mitral valve  regurgitation. No evidence of mitral stenosis.   6. Tricuspid valve regurgitation is mild to moderate.   7. The aortic valve is tricuspid. Aortic valve regurgitation is mild.  Aortic valve sclerosis is present, with no evidence of aortic valve  stenosis.   Comparison(s): No significant change from prior study.   Patient Profile     89 y.o. female permanent atrial fibrillation followed by atrial fibrillation clinic, type 2 diabetes mellitus,  hyperlipidemia, hypothyroidism, history of breast cancer status postlumpectomy, OSA on CPAP.  Currently admitted for a traumatic pelvic fracture.  Course has been complicated by hypotensive status and respiratory failure requiring critical care and intubation with vasopressors.  Now has new ileus with concerns of aspiration PNA on abx. CCM following.   Assessment & Plan    Permanent atrial fibrillation Has failed multiple treatment plans (Tikosyn, multiple cardioversions).  Initially difficult to rate control however is maintaining controlled with heart rates 80s to 100.  Has been on long-term amiodarone since 2015.   N.p.o. currently due to ileus.  Continue IV amiodarone. Defer to MD about amio tapering dose at DC. Eliquis has been stopped due to ileus and n.p.o. status. Currently on full dose Lovonox. Consider IV heparin given tenuous state and  possible need for procedures. Defer to MD. Echo shows normal LVEF 65 to 70%.  Severely dilated biatrial enlargement.  Traumatic pelvic fracture Aspiration pneumonia intubated and on IV antibiotics Per primary team.  Hyperkalemia 5.5.  No labs this morning.  Will continue to follow. For questions or updates, please contact [facility] Please consult [email or web address] for contact info under   Signed, Abagail Kitchens, PA-C  05/14/2023, 7:53 AM    As above, patient seen and examined.  Events overnight noted.  Patient developed acute respiratory distress felt likely secondary to aspiration.  She required intubation.  Plan for her atrial fibrillation is rate control and anticoagulation.  Blood pressure has been an issue.  However heart rate is reasonably well-controlled on IV amiodarone which will be continued while she is intubated.  Continue Lovenox.  Note LV function is normal.  Other issues per primary service.  Olga Millers, MD

## 2023-05-14 DEATH — deceased

## 2023-05-15 LAB — CULTURE, RESPIRATORY W GRAM STAIN

## 2023-05-16 LAB — CULTURE, RESPIRATORY W GRAM STAIN

## 2023-05-22 ENCOUNTER — Ambulatory Visit: Payer: Medicare PPO | Admitting: Internal Medicine

## 2023-05-26 DIAGNOSIS — J9601 Acute respiratory failure with hypoxia: Secondary | ICD-10-CM | POA: Diagnosis not present

## 2023-05-26 DIAGNOSIS — I5032 Chronic diastolic (congestive) heart failure: Secondary | ICD-10-CM | POA: Diagnosis not present

## 2023-05-26 DIAGNOSIS — I5033 Acute on chronic diastolic (congestive) heart failure: Secondary | ICD-10-CM | POA: Diagnosis not present

## 2023-06-10 DIAGNOSIS — S329XXA Fracture of unspecified parts of lumbosacral spine and pelvis, initial encounter for closed fracture: Secondary | ICD-10-CM | POA: Diagnosis not present

## 2023-06-14 NOTE — Death Summary Note (Signed)
DEATH SUMMARY   Patient Details  Name: [name] MRN: [id number] DOB: 26-Jul-1931  Admission/Discharge Information   Admit Date:  2023-05-24  Date of Death: Date of Death: 2023/06/03  Time of Death: Time of Death: 0348  Length of Stay: 10-Jun-2023  Referring Physician: Joya Martyr, MD   Reason(s) for Hospitalization  Fall   Diagnoses  Preliminary cause of death: septic shock Secondary Diagnoses (including complications and co-morbidities):  Principal Problem:   Pelvic fracture (HCC) Active Problems:   Essential hypertension   GERD   DMII (diabetes mellitus, type 2) (HCC)   Post-surgical hypothyroidism   Persistent atrial fibrillation (HCC)   Physical deconditioning   Depression with anxiety   Goals of care, counseling/discussion   Encounter for palliative care   DNR (do not resuscitate)   Septic shock (HCC)   Aspiration into airway   Acute respiratory failure with hypoxia (HCC) Aspiration pneumonia  Chronic anticoagulation  Metabolic acidosis AKI  Iron deficiency anemia  Hyperkalemia  Hyponatremia  Hypocalcemia  Hypomagnesemia  Shock liver   Brief Hospital Course (including significant findings, care, treatment, and services provided and events leading to death)  [name] is a 89 y.o. year old female who was admitted 05/24/23 with bilateral pubic rami fractures after a witnessed fall at home. She was evaluated by ortho with plan for medical management. She continued working with PT/OT this admission, requiring ongoing pain management and acute management of Afib RVR. On 6/30 cardiology was consulted for her A Fib.  Later 6/30, a rapid response was called for hypoxia, AMS, hypotension. She was transferred to the ICU under PCCM, intubated, central and arterial lines placed and started on pressors + antibiotics for aspiration PNA. 7/1 the patient had profoundly worse shock.  GOC conversations were readdressed and the patient's code status was updated to DNR. She  ultimately transitioned to comfort care with family by her side and died peacefully 06/03/2023 at 0348    Pertinent Labs and Studies  Significant Diagnostic Studies VAS [place] LOWER EXTREMITY VENOUS (DVT)  Result Date: 05/14/2023  Lower Venous DVT Study Patient Name:  [name]  Date of Exam:   05/14/2023 Medical Rec #: [id number]         Accession #:    [id number] Date of Birth: 30-Nov-1930          Patient Gender: F Patient Age:   73 years Exam Location:  [facility] Procedure:      VAS [place] LOWER EXTREMITY VENOUS (DVT) Referring Phys: Levon Hedger --------------------------------------------------------------------------------  Indications: Edema.  Risk Factors: Recent fall pelvic/hip fracture. Comparison Study: Previous exam on 01/21/23 was negative for DVT Performing Technologist: Ernestene Mention RVT, RDMS  Examination Guidelines: A complete evaluation includes B-mode imaging, spectral Doppler, color Doppler, and power Doppler as needed of all accessible portions of each vessel. Bilateral testing is considered an integral part of a complete examination. Limited examinations for reoccurring indications may be performed as noted. The reflux portion of the exam is performed with the patient in reverse Trendelenburg.  +---------+---------------+---------+-----------+----------+--------------+ RIGHT    CompressibilityPhasicitySpontaneityPropertiesThrombus Aging +---------+---------------+---------+-----------+----------+--------------+ CFV      Full           Yes      Yes                                 +---------+---------------+---------+-----------+----------+--------------+ SFJ      Full                                                        +---------+---------------+---------+-----------+----------+--------------+  FV Prox  Full           Yes      Yes                                 +---------+---------------+---------+-----------+----------+--------------+ FV Mid   Full            Yes      Yes                                 +---------+---------------+---------+-----------+----------+--------------+ FV DistalFull           Yes      Yes                                 +---------+---------------+---------+-----------+----------+--------------+ PFV      Full                                                        +---------+---------------+---------+-----------+----------+--------------+ POP      Full           Yes      Yes                                 +---------+---------------+---------+-----------+----------+--------------+ PTV      Full                                                        +---------+---------------+---------+-----------+----------+--------------+ PERO     Full                                                        +---------+---------------+---------+-----------+----------+--------------+   +---------+---------------+---------+-----------+----------+--------------+ LEFT     CompressibilityPhasicitySpontaneityPropertiesThrombus Aging +---------+---------------+---------+-----------+----------+--------------+ CFV      Full           Yes      Yes                                 +---------+---------------+---------+-----------+----------+--------------+ SFJ      Full                                                        +---------+---------------+---------+-----------+----------+--------------+ FV Prox  Full           Yes      Yes                                 +---------+---------------+---------+-----------+----------+--------------+ FV Mid   Full  Yes      Yes                                 +---------+---------------+---------+-----------+----------+--------------+ FV DistalFull           Yes      Yes                                 +---------+---------------+---------+-----------+----------+--------------+ PFV      Full                                                         +---------+---------------+---------+-----------+----------+--------------+ POP      Full           Yes      Yes                                 +---------+---------------+---------+-----------+----------+--------------+ PTV      Full                                                        +---------+---------------+---------+-----------+----------+--------------+ PERO     Full                                                        +---------+---------------+---------+-----------+----------+--------------+ Left popliteal vein poorly visualized due to patient position. Patent in areas visualized.    Summary: BILATERAL: - No evidence of deep vein thrombosis seen in the lower extremities, bilaterally. -No evidence of popliteal cyst, bilaterally. -Diffuse subcutaneous edema, bilaterally.   *See table(s) above for measurements and observations. Electronically signed by Gerarda Fraction on 05/14/2023 at 3:14:59 PM.    Final    DG CHEST PORT 1 VIEW  Result Date: 05/14/2023 CLINICAL DATA:  Concern for pneumoperitoneum EXAM: PORTABLE CHEST 1 VIEW COMPARISON:  Abdominal radiograph dated April 16, 2023 FINDINGS: Stable position of ETT and right IJ line. NG tube tip and side port project over the expected area of the stomach. Cardiac and mediastinal contours are unchanged when accounting for patient rotation. Mild bibasilar opacities which are likely due to atelectasis. Numerous markedly distended gas-filled loops of bowel are seen in the upper abdomen, unchanged when compared with prior. No definite free air, although large degree of gaseous distention limits evaluation. IMPRESSION: 1. Numerous markedly distended gas-filled loops of bowel partially visualized in the upper abdomen, similar to prior radiograph. No definite free air, although large degree of gaseous distention limits evaluation. 2. Mild bibasilar opacities which are likely due to atelectasis. Electronically Signed   By: Allegra Lai M.D.    On: 05/14/2023 15:03   DG Abd 1 View  Result Date: 05/13/2023 CLINICAL DATA:  Central line placement.  Possible ileus. EXAM: ABDOMEN - 1 VIEW COMPARISON:  01/10/2023, CT 05/06/2023 FINDINGS: Prominent air-filled colonic loops with the largest over  the right abdomen measuring 13.8 cm. This may represent dilated sigmoid loop versus cecum. There are a few air-filled nondilated small bowel loops present over the lower abdomen/pelvis. No definite free peritoneal air. Remainder the exam is unchanged including several pelvic fractures. IMPRESSION: Prominent air-filled colonic loops with the largest over the right abdomen measuring 13.8 cm. This may represent dilated sigmoid loop versus cecum. Findings could be due to colonic ileus versus obstruction. Recommend CT for further evaluation. Electronically Signed   By: Elberta Fortis M.D.   On: 05/13/2023 16:37   DG Chest 1 View  Result Date: 05/13/2023 CLINICAL DATA:  Endotracheal tube, OG tube in position EXAM: CHEST  1 VIEW COMPARISON:  Same day chest radiograph, 2:11 p.m. FINDINGS: Interval placement of esophagogastric tube, tip and side port below the diaphragm. Interval placement of endotracheal tube, tip projecting over the midtrachea. Unchanged atelectasis or consolidation of the right lung base. Cardiomegaly. Unchanged diffusely distended bowel in the included upper abdomen. IMPRESSION: 1. Interval placement of esophagogastric tube, tip and side port below the diaphragm. 2. Interval placement of endotracheal tube, tip projecting over the midtrachea. 3. Unchanged atelectasis or consolidation of the right lung base. 4. Unchanged diffusely distended bowel in the included upper abdomen. Electronically Signed   By: Jearld Lesch M.D.   On: 05/13/2023 16:32   DG Chest 1 View  Result Date: 05/13/2023 CLINICAL DATA:  Shortness of breath EXAM: CHEST  1 VIEW COMPARISON:  05/06/2023 FINDINGS: Very rotated, limited low volume AP portable examination. Atelectasis or  consolidation of the right lung base. Left lung is normally aerated. Cardiomegaly. Very patulous, gas-filled esophagus. Diffusely distended bowel in the included upper abdomen. IMPRESSION: 1. Very rotated, limited low volume AP portable examination. 2. Atelectasis or consolidation of the right lung base. 3. Cardiomegaly. 4. Very patulous, gas-filled esophagus. Diffusely distended bowel in the included upper abdomen. Electronically Signed   By: Jearld Lesch M.D.   On: 05/13/2023 14:33   ECHOCARDIOGRAM COMPLETE  Result Date: 05/13/2023    ECHOCARDIOGRAM REPORT   Patient Name:   [name] Date of Exam: 05/13/2023 Medical Rec #:  [id number]        Height:       66.0 in Accession #:    [id number]       Weight:       134.3 lb Date of Birth:  1931/06/05         BSA:          1.688 m Patient Age:    89 years         BP:           125/78 mmHg Patient Gender: F                HR:           100 bpm. Exam Location:  Inpatient Procedure: 2D Echo, Color Doppler and Cardiac Doppler Indications:    Atrial fibrillation  History:        Patient has prior history of Echocardiogram examinations, most                 recent 01/22/2023. Cardiomyopathy; Risk Factors:Diabetes, Sleep                 Apnea and Hypertension. BC.  Sonographer:    Milda Smart Referring Phys: [id number] [facility]  Sonographer Comments: No subcostal window. Image acquisition challenging due to patient body habitus and Image acquisition challenging due to respiratory motion. Pt unable to  lay in optimal position due to bilateral pelvic fracture. IMPRESSIONS  1. Left ventricular ejection fraction, by estimation, is 65 to 70%. The left ventricle has normal function. The left ventricle has no regional wall motion abnormalities. There is moderate concentric left ventricular hypertrophy. Left ventricular diastolic function could not be evaluated.  2. Right ventricular systolic function is mildly reduced. The right ventricular size is moderately enlarged.  3.  Left atrial size was severely dilated.  4. Right atrial size was severely dilated.  5. The mitral valve is grossly normal. Trivial mitral valve regurgitation. No evidence of mitral stenosis.  6. Tricuspid valve regurgitation is mild to moderate.  7. The aortic valve is tricuspid. Aortic valve regurgitation is mild. Aortic valve sclerosis is present, with no evidence of aortic valve stenosis. Comparison(s): No significant change from prior study. FINDINGS  Left Ventricle: Left ventricular ejection fraction, by estimation, is 65 to 70%. The left ventricle has normal function. The left ventricle has no regional wall motion abnormalities. The left ventricular internal cavity size was normal in size. There is  moderate concentric left ventricular hypertrophy. Left ventricular diastolic function could not be evaluated due to atrial fibrillation. Left ventricular diastolic function could not be evaluated. Right Ventricle: The right ventricular size is moderately enlarged. No increase in right ventricular wall thickness. Right ventricular systolic function is mildly reduced. Left Atrium: Left atrial size was severely dilated. Right Atrium: Right atrial size was severely dilated. Pericardium: There is no evidence of pericardial effusion. Mitral Valve: The mitral valve is grossly normal. Trivial mitral valve regurgitation. No evidence of mitral valve stenosis. Tricuspid Valve: The tricuspid valve is grossly normal. Tricuspid valve regurgitation is mild to moderate. No evidence of tricuspid stenosis. Aortic Valve: The aortic valve is tricuspid. Aortic valve regurgitation is mild. Aortic valve sclerosis is present, with no evidence of aortic valve stenosis. Pulmonic Valve: The pulmonic valve was grossly normal. Pulmonic valve regurgitation is trivial. No evidence of pulmonic stenosis. Aorta: The aortic root and ascending aorta are structurally normal, with no evidence of dilitation. Venous: The inferior vena cava was not well  visualized. IAS/Shunts: The interatrial septum was not well visualized.  LEFT VENTRICLE PLAX 2D LVIDd:         3.60 cm     Diastology LVIDs:         2.80 cm     LV e' medial:  5.55 cm/s LV PW:         1.40 cm     LV e' lateral: 9.23 cm/s LV IVS:        1.40 cm LVOT diam:     2.00 cm LVOT Area:     3.14 cm  LV Volumes (MOD) LV vol d, MOD A2C: 20.5 ml LV vol d, MOD A4C: 44.6 ml LV vol s, MOD A2C: 9.3 ml LV vol s, MOD A4C: 19.2 ml LV SV MOD A2C:     11.2 ml LV SV MOD A4C:     44.6 ml LV SV MOD BP:      17.8 ml RIGHT VENTRICLE RV S prime:     7.83 cm/s TAPSE (M-mode): 1.4 cm LEFT ATRIUM             Index        RIGHT ATRIUM           Index LA diam:        4.90 cm 2.90 cm/m   RA Area:     25.10 cm LA Vol (A2C):  76.1 ml 45.08 ml/m  RA Volume:   80.50 ml  47.68 ml/m LA Vol (A4C):   89.9 ml 53.25 ml/m LA Biplane Vol: 83.0 ml 49.16 ml/m   AORTA Ao Root diam: 3.50 cm Ao Asc diam:  3.80 cm TRICUSPID VALVE TR Peak grad:   42.2 mmHg TR Mean grad:   28.0 mmHg TR Vmax:        325.00 cm/s TR Vmean:       257.0 cm/s  SHUNTS Systemic Diam: 2.00 cm Lennie Odor MD Electronically signed by Lennie Odor MD Signature Date/Time: 05/13/2023/11:36:41 AM    Final    CT PELVIS WO CONTRAST  Result Date: 05/06/2023 CLINICAL DATA:  With pelvic fracture and possible bladder injury. EXAM: CT PELVIS WITHOUT CONTRAST TECHNIQUE: Multidetector CT imaging of the pelvis was performed following the standard protocol without intravenous contrast. RADIATION DOSE REDUCTION: This exam was performed according to the departmental dose-optimization program which includes automated exposure control, adjustment of the mA and/or kV according to patient size and/or use of iterative reconstruction technique. COMPARISON:  None Available. FINDINGS: Urinary Tract: There is contrast material within the urinary bladder. No extravasation. Bowel:  Unremarkable visualized pelvic bowel loops. Vascular/Lymphatic: Calcific aortic atherosclerosis. Reproductive:   Status post hysterectomy. Other:  None Musculoskeletal: L4-5 PLIF. Minimally displaced fracture of the right sacral wing. Comminuted fractures of the inferior and superior pubic rami bilaterally IMPRESSION: 1. Minimally displaced fracture of the right sacral wing. 2. No evidence of bladder injury. 3. Comminuted fractures of the inferior and superior pubic rami bilaterally. Aortic Atherosclerosis (ICD10-I70.0). Electronically Signed   By: Deatra Robinson M.D.   On: 05/06/2023 02:54   CT CHEST ABDOMEN PELVIS W CONTRAST  Result Date: 05/06/2023 CLINICAL DATA:  Fall with back pain and pelvis pain EXAM: CT CHEST, ABDOMEN, AND PELVIS WITH CONTRAST TECHNIQUE: Multidetector CT imaging of the chest, abdomen and pelvis was performed following the standard protocol during bolus administration of intravenous contrast. RADIATION DOSE REDUCTION: This exam was performed according to the departmental dose-optimization program which includes automated exposure control, adjustment of the mA and/or kV according to patient size and/or use of iterative reconstruction technique. CONTRAST:  OMNIPAQUE IOHEXOL 300 MG/ML  SOLN COMPARISON:  Chest radiograph 05/06/2023; CTA chest 01/21/2023; CT abdomen and pelvis 01/10/2023 FINDINGS: CT CHEST FINDINGS Cardiovascular: Cardiomegaly. No pericardial effusion. No acute aortic injury. No central pulmonary embolism. Coronary artery and aortic atherosclerotic calcification. Mediastinum/Nodes: Patulous esophagus and small hiatal hernia with distal esophageal wall thickening. Trachea is unremarkable. No mediastinal hematoma. No thoracic adenopathy. Lungs/Pleura: Low-density small right pleural effusion. No left pleural effusion. No pneumothorax. Respiratory motion obscures fine detail. Atelectasis, architectural distortion, and bronchiolectasis in the right middle and lower lobes. Pneumonia is difficult to exclude. Subtle mosaic attenuation of the lungs compatible with air trapping.  Musculoskeletal: No acute fracture or destructive osseous lesion. Multiple subacute or chronic right rib fractures. CT ABDOMEN PELVIS FINDINGS Hepatobiliary: No evidence of liver injury. Unremarkable gallbladder and biliary tree. Pancreas: Fatty atrophy.  No acute abnormality. Spleen: No splenic injury or perisplenic hematoma. Adrenals/Urinary Tract: No adrenal hemorrhage or renal injury identified. Unchanged indeterminate 1.1 cm left upper pole lesion. There are blood products in the anterior pelvis confluence with the anterior bladder wall (circa series 4/image 112) and bladder injury is not excluded. Stomach/Bowel: Normal caliber large and small bowel. Colonic diverticulosis without diverticulitis. No bowel wall thickening. Stomach is within normal limits. Vascular/Lymphatic: Aortic atherosclerosis. No enlarged abdominal or pelvic lymph nodes. Reproductive: No acute abnormality. Other: Extraperitoneal  blood products in the pelvis. No free intraperitoneal air. Musculoskeletal: Posterior fusion L4-L5. Bilateral comminuted mildly displaced fractures of the inferior and superior pubic rami fractures extend into the pubic body bilaterally. Adjacent hyperdense fluid compatible with hemorrhage. Some of the hemorrhage is confluent with the anterior wall of the bladder (circa series 4/image 112). Bladder wall injury is difficult to exclude. IMPRESSION: 1. Bilateral comminuted mildly displaced fractures of the inferior and superior pubic rami extending into the pubic body bilaterally. Adjacent extraperitoneal blood products in the pelvis. Some of the hemorrhage is confluent with the anterior wall of the bladder and bladder injury is not excluded. Unfortunately delayed images do not extend through the pelvis. This could be further evaluated with repeat noncontrast CT of the pelvis with attention for extraluminal excreted contrast outside the bladder. 2. Small low-density right pleural effusion. No pneumothorax. 3. Patulous  esophagus and small hiatal hernia with distal esophageal wall thickening. Correlate for esophagitis. 4. Unchanged 1.1 cm indeterminate left upper pole renal lesion. If appropriate given life expectancy and comorbidities consider nonemergent contrast-enhanced MRI for further characterization. These results were called by telephone at the time of interpretation on 05/06/2023 at 1:31 am to provider JOSHUA LONG , who verbally acknowledged these results. Electronically Signed   By: Minerva Fester M.D.   On: 05/06/2023 01:31   CT Head Wo Contrast  Result Date: 05/06/2023 CLINICAL DATA:  Head trauma, moderate-severe; Neck trauma (Age >= 65y) EXAM: CT HEAD WITHOUT CONTRAST CT CERVICAL SPINE WITHOUT CONTRAST TECHNIQUE: Multidetector CT imaging of the head and cervical spine was performed following the standard protocol without intravenous contrast. Multiplanar CT image reconstructions of the cervical spine were also generated. RADIATION DOSE REDUCTION: This exam was performed according to the departmental dose-optimization program which includes automated exposure control, adjustment of the mA and/or kV according to patient size and/or use of iterative reconstruction technique. COMPARISON:  CT head 12/05/2021, MRI cervical spine 11/26/2018 FINDINGS: CT HEAD FINDINGS Brain: Cerebral ventricle sizes are concordant with the degree of cerebral volume loss. No evidence of large-territorial acute infarction. No parenchymal hemorrhage. No mass lesion. No extra-axial collection. No mass effect or midline shift. No hydrocephalus. Basilar cisterns are patent. Vascular: No hyperdense vessel. Atherosclerotic calcifications are present within the cavernous internal carotid and vertebral arteries. Skull: No acute fracture or focal lesion. Temporomandibular joint degenerative changes. Sinuses/Orbits: Paranasal sinuses and mastoid air cells are clear. Bilateral lens replacement. Otherwise the orbits are unremarkable. Other: None. CT  CERVICAL SPINE FINDINGS Alignment: Stable grade 1 anterolisthesis of C5 on C6. Skull base and vertebrae: Multilevel severe degenerative changes spine. Moderate to severe left C3-C4 osseous neural foraminal stenosis. No severe osseous central canal stenosis. No acute fracture. No aggressive appearing focal osseous lesion or focal pathologic process. Soft tissues and spinal canal: No prevertebral fluid or swelling. No visible canal hematoma. Upper chest: Unremarkable. Other: None. IMPRESSION: 1. No acute intracranial abnormality. 2. No acute displaced fracture or traumatic listhesis of the cervical spine. Electronically Signed   By: Tish Frederickson M.D.   On: 05/06/2023 01:12   CT Cervical Spine Wo Contrast  Result Date: 05/06/2023 CLINICAL DATA:  Head trauma, moderate-severe; Neck trauma (Age >= 65y) EXAM: CT HEAD WITHOUT CONTRAST CT CERVICAL SPINE WITHOUT CONTRAST TECHNIQUE: Multidetector CT imaging of the head and cervical spine was performed following the standard protocol without intravenous contrast. Multiplanar CT image reconstructions of the cervical spine were also generated. RADIATION DOSE REDUCTION: This exam was performed according to the departmental dose-optimization program which includes automated  exposure control, adjustment of the mA and/or kV according to patient size and/or use of iterative reconstruction technique. COMPARISON:  CT head 12/05/2021, MRI cervical spine 11/26/2018 FINDINGS: CT HEAD FINDINGS Brain: Cerebral ventricle sizes are concordant with the degree of cerebral volume loss. No evidence of large-territorial acute infarction. No parenchymal hemorrhage. No mass lesion. No extra-axial collection. No mass effect or midline shift. No hydrocephalus. Basilar cisterns are patent. Vascular: No hyperdense vessel. Atherosclerotic calcifications are present within the cavernous internal carotid and vertebral arteries. Skull: No acute fracture or focal lesion. Temporomandibular joint  degenerative changes. Sinuses/Orbits: Paranasal sinuses and mastoid air cells are clear. Bilateral lens replacement. Otherwise the orbits are unremarkable. Other: None. CT CERVICAL SPINE FINDINGS Alignment: Stable grade 1 anterolisthesis of C5 on C6. Skull base and vertebrae: Multilevel severe degenerative changes spine. Moderate to severe left C3-C4 osseous neural foraminal stenosis. No severe osseous central canal stenosis. No acute fracture. No aggressive appearing focal osseous lesion or focal pathologic process. Soft tissues and spinal canal: No prevertebral fluid or swelling. No visible canal hematoma. Upper chest: Unremarkable. Other: None. IMPRESSION: 1. No acute intracranial abnormality. 2. No acute displaced fracture or traumatic listhesis of the cervical spine. Electronically Signed   By: Tish Frederickson M.D.   On: 05/06/2023 01:12   DG Hip Unilat W or Wo Pelvis 2-3 Views Right  Result Date: 05/06/2023 CLINICAL DATA:  Status post fall. EXAM: DG HIP (WITH OR WITHOUT PELVIS) 2-3V RIGHT COMPARISON:  None Available. FINDINGS: Acute fracture deformities are suspected within the regions of the right superior and right inferior pubic rami. There is no evidence of dislocation. Marked severity degenerative changes are seen involving the right hip, in the form of joint space narrowing and acetabular sclerosis. IMPRESSION: Acute fracture deformities of the right superior and right inferior pubic rami. CT correlation is recommended. Electronically Signed   By: Aram Candela M.D.   On: 05/06/2023 00:43   DG Knee Complete 4 Views Right  Result Date: 05/06/2023 CLINICAL DATA:  Status post fall. EXAM: RIGHT KNEE - COMPLETE 4+ VIEW COMPARISON:  None Available. FINDINGS: No evidence of an acute fracture or dislocation. Medial and lateral marginal osteophyte formation is seen. There is marked severity patellofemoral, medial tibiofemoral and lateral tibiofemoral compartment space narrowing. Medial and lateral  chondrocalcinosis is also present. A small joint effusion is suspected. IMPRESSION: 1. Marked severity tricompartmental degenerative changes. 2. Small joint effusion. Electronically Signed   By: Aram Candela M.D.   On: 05/06/2023 00:39   DG Chest Portable 1 View  Result Date: 05/06/2023 CLINICAL DATA:  Status post fall. EXAM: PORTABLE CHEST 1 VIEW COMPARISON:  February 27, 2023 FINDINGS: The cardiac silhouette is mildly enlarged. There is marked severity calcification of the aortic arch. Mild right perihilar, right infrahilar and right basilar atelectasis and/or infiltrate is noted. There is no evidence of a pleural effusion or pneumothorax. Radiopaque surgical clips are seen along the lateral aspect of the upper right chest wall. The visualized skeletal structures are unremarkable. IMPRESSION: Mild right perihilar, right infrahilar and right basilar atelectasis and/or infiltrate. Electronically Signed   By: Aram Candela M.D.   On: 05/06/2023 00:29    Microbiology Recent Results (from the past 240 hour(s))  Culture, Respiratory w Gram Stain     Status: None (Preliminary result)   Collection Time: 05/13/23  5:04 PM   Specimen: Tracheal Aspirate; Respiratory  Result Value Ref Range Status   Specimen Description   Final    TRACHEAL ASPIRATE Performed at [facility]  [facility], [address], [place], [place] [address]    Special Requests   Final    NONE Performed at [facility], [address] [place], [place] [address]    Gram Stain   Final    RARE WBC PRESENT, PREDOMINANTLY PMN FEW GRAM POSITIVE COCCI RARE GRAM NEGATIVE RODS    Culture   Final    CULTURE REINCUBATED FOR BETTER GROWTH Performed at [facility], [address], [place], [place] [address]    Report Status PENDING  Incomplete    Lab Basic Metabolic Panel: Recent Labs  Lab 05/09/23 0510 05/10/23 0528 05/11/23 0523 05/12/23 0601 05/13/23 1713 05/14/23 0806  NA 132* 133*  132* 130* 132* 127*  K 3.6 3.7 3.9 4.2 5.5* 5.0  CL 101 102 103 101 103 101  CO2 22 22 21* 23 21* 18*  GLUCOSE 172* 187* 179* 199* 374* 231*  BUN 21 20 21 23  36* 39*  CREATININE 0.60 0.61 0.61 0.63 0.93 1.13*  CALCIUM 8.4* 8.3* 8.5* 8.1* 7.8* 7.7*  MG 1.9  --   --   --   --  1.5*  PHOS  --   --   --   --   --  4.0   Liver Function Tests: Recent Labs  Lab 05/14/23 0806  AST 72*  ALT 50*  ALKPHOS 131*  BILITOT 0.7  PROT 5.5*  ALBUMIN 2.3*   No results for input(s): "LIPASE", "AMYLASE" in the last 168 hours. No results for input(s): "AMMONIA" in the last 168 hours. CBC: Recent Labs  Lab 05/09/23 0510 05/10/23 0528 05/11/23 0523 05/12/23 0601 05/13/23 1713 05/14/23 0806  WBC 9.7 8.8 9.0 9.0 17.1* 18.2*  NEUTROABS 7.8* 6.7 6.9 6.8 15.0*  --   HGB 10.4* 10.5* 10.5* 10.4* 11.0* 11.3*  HCT 32.1* 32.2* 31.9* 32.1* 34.5* 34.4*  MCV 97.9 98.2 97.0 97.6 101.5* 98.3  PLT 154 164 208 215 244 250   Cardiac Enzymes: No results for input(s): "CKTOTAL", "CKMB", "CKMBINDEX", "TROPONINI" in the last 168 hours. Sepsis Labs: Recent Labs  Lab 05/11/23 0523 05/12/23 0601 05/13/23 1713 05/14/23 0053 05/14/23 0806  PROCALCITON  --   --  0.26  --   --   WBC 9.0 9.0 17.1*  --  18.2*  LATICACIDVEN  --   --  3.1* 1.8  --     Procedures/Operations  6/30 ETT 6/30 CVC 6/30 arterial line    Lanier Clam 05/29/2023, 11:48 AM

## 2023-06-14 NOTE — Accreditation Note (Signed)
Restraint death within 24 hours of bilateral soft wrist restraint release. Logged on 05/21/2023 at 1601 by C. Zarin Knupp RNBSN

## 2023-06-14 NOTE — Progress Notes (Signed)
Daughter called to inform her of the of the patient passing away.

## 2023-06-14 DEATH — deceased
